# Patient Record
Sex: Male | Born: 1950 | Race: White | Hispanic: No | Smoking: Current every day smoker
Health system: Southern US, Community
[De-identification: ages and names within clinical notes are randomized; demographics above are authoritative.]

## PROBLEM LIST (undated history)

## (undated) DIAGNOSIS — C801 Malignant (primary) neoplasm, unspecified: Secondary | ICD-10-CM

## (undated) DIAGNOSIS — C4491 Basal cell carcinoma of skin, unspecified: Secondary | ICD-10-CM

## (undated) DIAGNOSIS — I2510 Atherosclerotic heart disease of native coronary artery without angina pectoris: Secondary | ICD-10-CM

## (undated) DIAGNOSIS — E2740 Unspecified adrenocortical insufficiency: Secondary | ICD-10-CM

## (undated) DIAGNOSIS — I10 Essential (primary) hypertension: Secondary | ICD-10-CM

## (undated) DIAGNOSIS — H269 Unspecified cataract: Secondary | ICD-10-CM

## (undated) DIAGNOSIS — K219 Gastro-esophageal reflux disease without esophagitis: Secondary | ICD-10-CM

## (undated) DIAGNOSIS — G629 Polyneuropathy, unspecified: Secondary | ICD-10-CM

## (undated) DIAGNOSIS — E785 Hyperlipidemia, unspecified: Secondary | ICD-10-CM

## (undated) DIAGNOSIS — I252 Old myocardial infarction: Secondary | ICD-10-CM

## (undated) DIAGNOSIS — M1990 Unspecified osteoarthritis, unspecified site: Secondary | ICD-10-CM

## (undated) HISTORY — DX: Hyperlipidemia, unspecified: E78.5

## (undated) HISTORY — DX: Polyneuropathy, unspecified: G62.9

## (undated) HISTORY — DX: Malignant (primary) neoplasm, unspecified: C80.1

## (undated) HISTORY — DX: Gastro-esophageal reflux disease without esophagitis: K21.9

## (undated) HISTORY — DX: Old myocardial infarction: I25.2

## (undated) HISTORY — DX: Unspecified cataract: H26.9

## (undated) HISTORY — DX: Atherosclerotic heart disease of native coronary artery without angina pectoris: I25.10

## (undated) HISTORY — DX: Basal cell carcinoma of skin, unspecified: C44.91

## (undated) HISTORY — PX: COLONOSCOPY: SHX174

## (undated) HISTORY — DX: Essential (primary) hypertension: I10

---

## 1997-03-10 HISTORY — PX: LARYNGOSCOPY: SUR817

## 2001-03-17 ENCOUNTER — Emergency Department (HOSPITAL_COMMUNITY): Admission: EM | Admit: 2001-03-17 | Discharge: 2001-03-17 | Payer: Self-pay | Admitting: Emergency Medicine

## 2001-03-17 ENCOUNTER — Encounter: Payer: Self-pay | Admitting: Emergency Medicine

## 2001-03-30 ENCOUNTER — Inpatient Hospital Stay (HOSPITAL_COMMUNITY): Admission: AD | Admit: 2001-03-30 | Discharge: 2001-03-31 | Payer: Self-pay | Admitting: Orthopedic Surgery

## 2001-03-30 DIAGNOSIS — I82409 Acute embolism and thrombosis of unspecified deep veins of unspecified lower extremity: Secondary | ICD-10-CM

## 2001-03-30 HISTORY — DX: Acute embolism and thrombosis of unspecified deep veins of unspecified lower extremity: I82.409

## 2002-04-07 ENCOUNTER — Ambulatory Visit (HOSPITAL_COMMUNITY): Admission: RE | Admit: 2002-04-07 | Discharge: 2002-04-07 | Payer: Self-pay | Admitting: Internal Medicine

## 2004-06-20 ENCOUNTER — Ambulatory Visit: Payer: Self-pay | Admitting: Internal Medicine

## 2004-06-27 ENCOUNTER — Ambulatory Visit: Payer: Self-pay | Admitting: Internal Medicine

## 2004-07-04 ENCOUNTER — Ambulatory Visit: Payer: Self-pay | Admitting: Internal Medicine

## 2004-07-04 ENCOUNTER — Inpatient Hospital Stay (HOSPITAL_COMMUNITY): Admission: EM | Admit: 2004-07-04 | Discharge: 2004-07-09 | Payer: Self-pay | Admitting: Emergency Medicine

## 2004-07-16 ENCOUNTER — Ambulatory Visit: Payer: Self-pay | Admitting: Internal Medicine

## 2004-07-18 ENCOUNTER — Encounter: Admission: RE | Admit: 2004-07-18 | Discharge: 2004-10-16 | Payer: Self-pay | Admitting: Internal Medicine

## 2004-07-30 ENCOUNTER — Ambulatory Visit: Payer: Self-pay | Admitting: Internal Medicine

## 2004-09-02 ENCOUNTER — Ambulatory Visit: Payer: Self-pay | Admitting: Internal Medicine

## 2004-11-07 ENCOUNTER — Ambulatory Visit: Payer: Self-pay | Admitting: Internal Medicine

## 2004-11-14 ENCOUNTER — Ambulatory Visit: Payer: Self-pay | Admitting: Internal Medicine

## 2007-10-09 DIAGNOSIS — I2510 Atherosclerotic heart disease of native coronary artery without angina pectoris: Secondary | ICD-10-CM

## 2007-10-09 HISTORY — DX: Atherosclerotic heart disease of native coronary artery without angina pectoris: I25.10

## 2007-12-11 ENCOUNTER — Ambulatory Visit: Payer: Self-pay | Admitting: Internal Medicine

## 2007-12-11 ENCOUNTER — Ambulatory Visit: Payer: Self-pay | Admitting: *Deleted

## 2007-12-11 ENCOUNTER — Inpatient Hospital Stay (HOSPITAL_COMMUNITY): Admission: EM | Admit: 2007-12-11 | Discharge: 2007-12-14 | Payer: Self-pay | Admitting: Emergency Medicine

## 2007-12-11 DIAGNOSIS — I252 Old myocardial infarction: Secondary | ICD-10-CM

## 2007-12-11 HISTORY — DX: Old myocardial infarction: I25.2

## 2007-12-11 IMAGING — CR DG CHEST 2V
2 series · 2 of 2 positions shown · non-contrast
Comparison: None.

CLINICAL DATA: Chest pain.  Smoker.  History of diabetes.

CHEST - 2 VIEW [DATE]:

[w chest pa *]
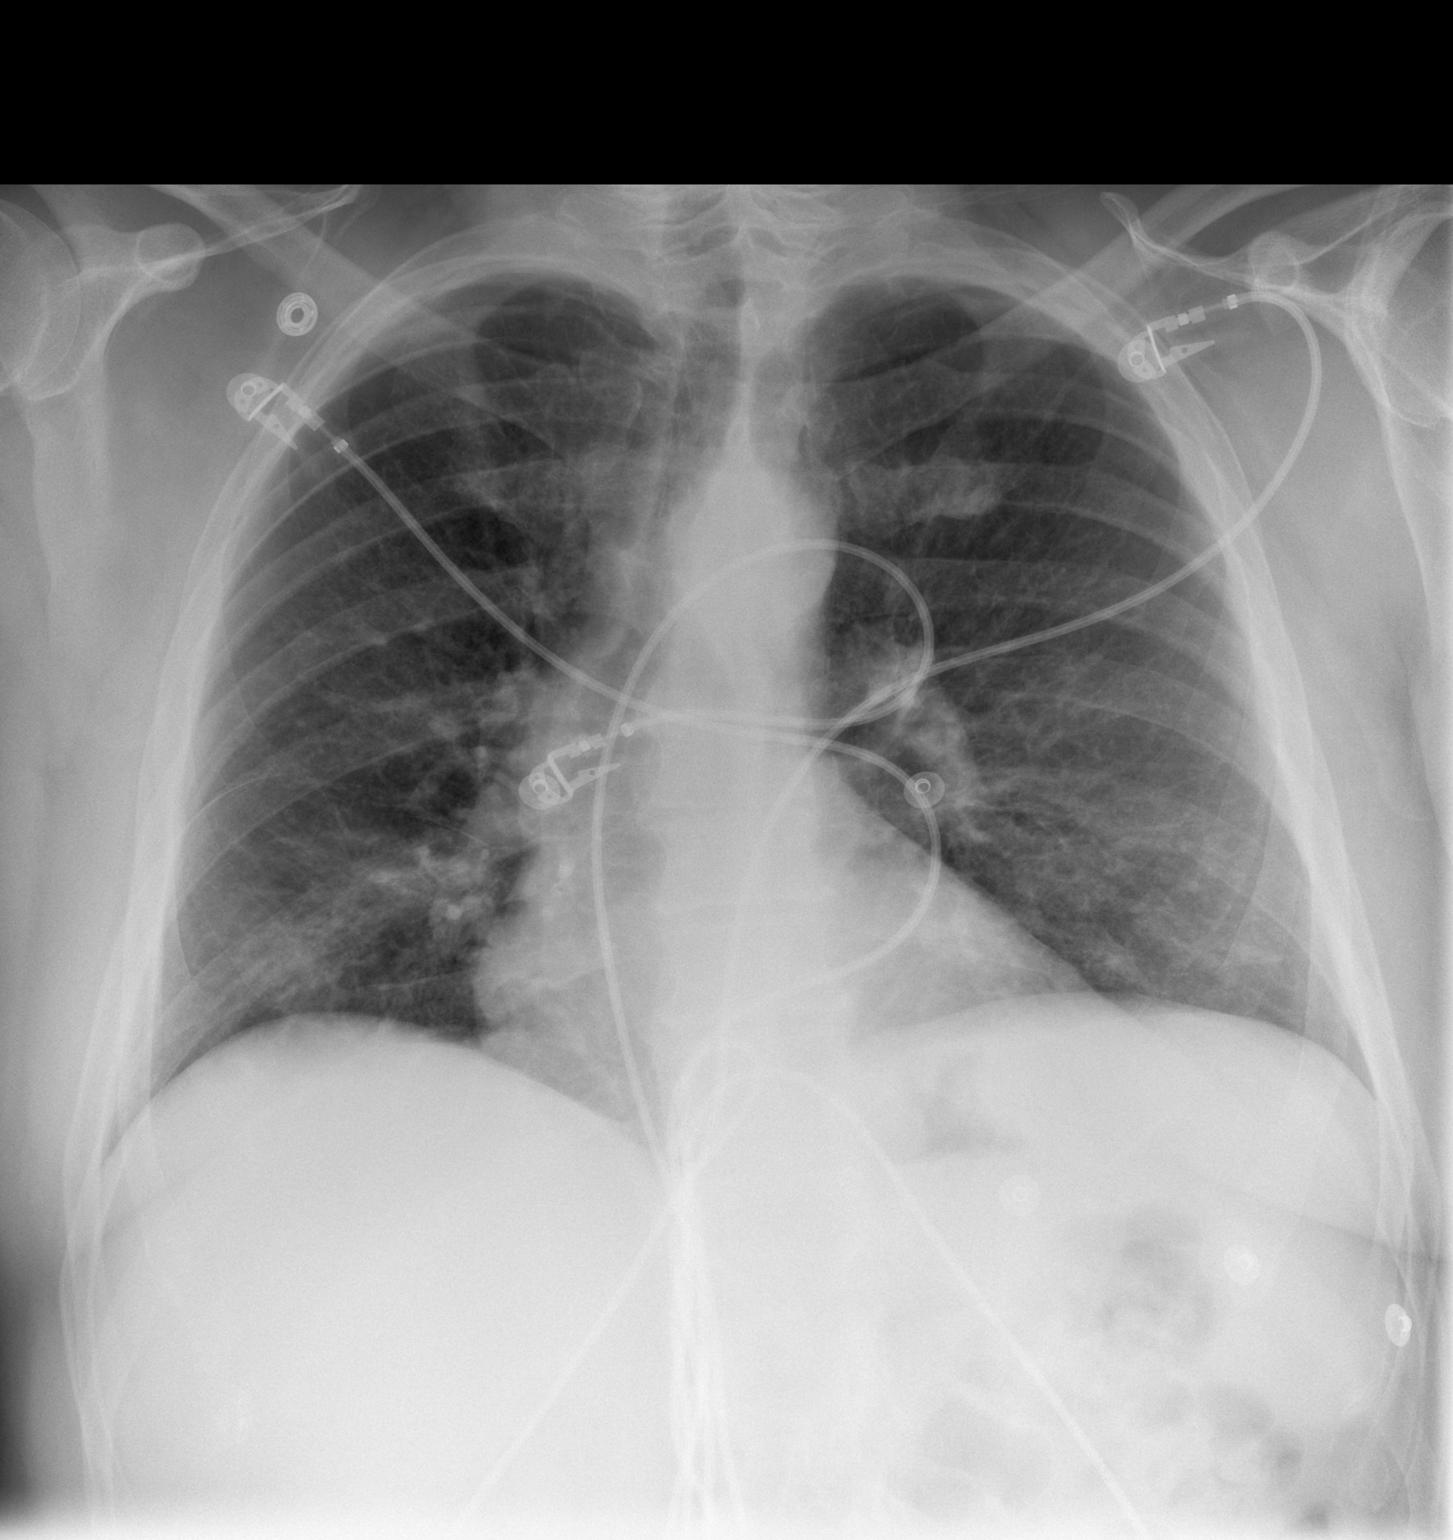

[w chest lat *]
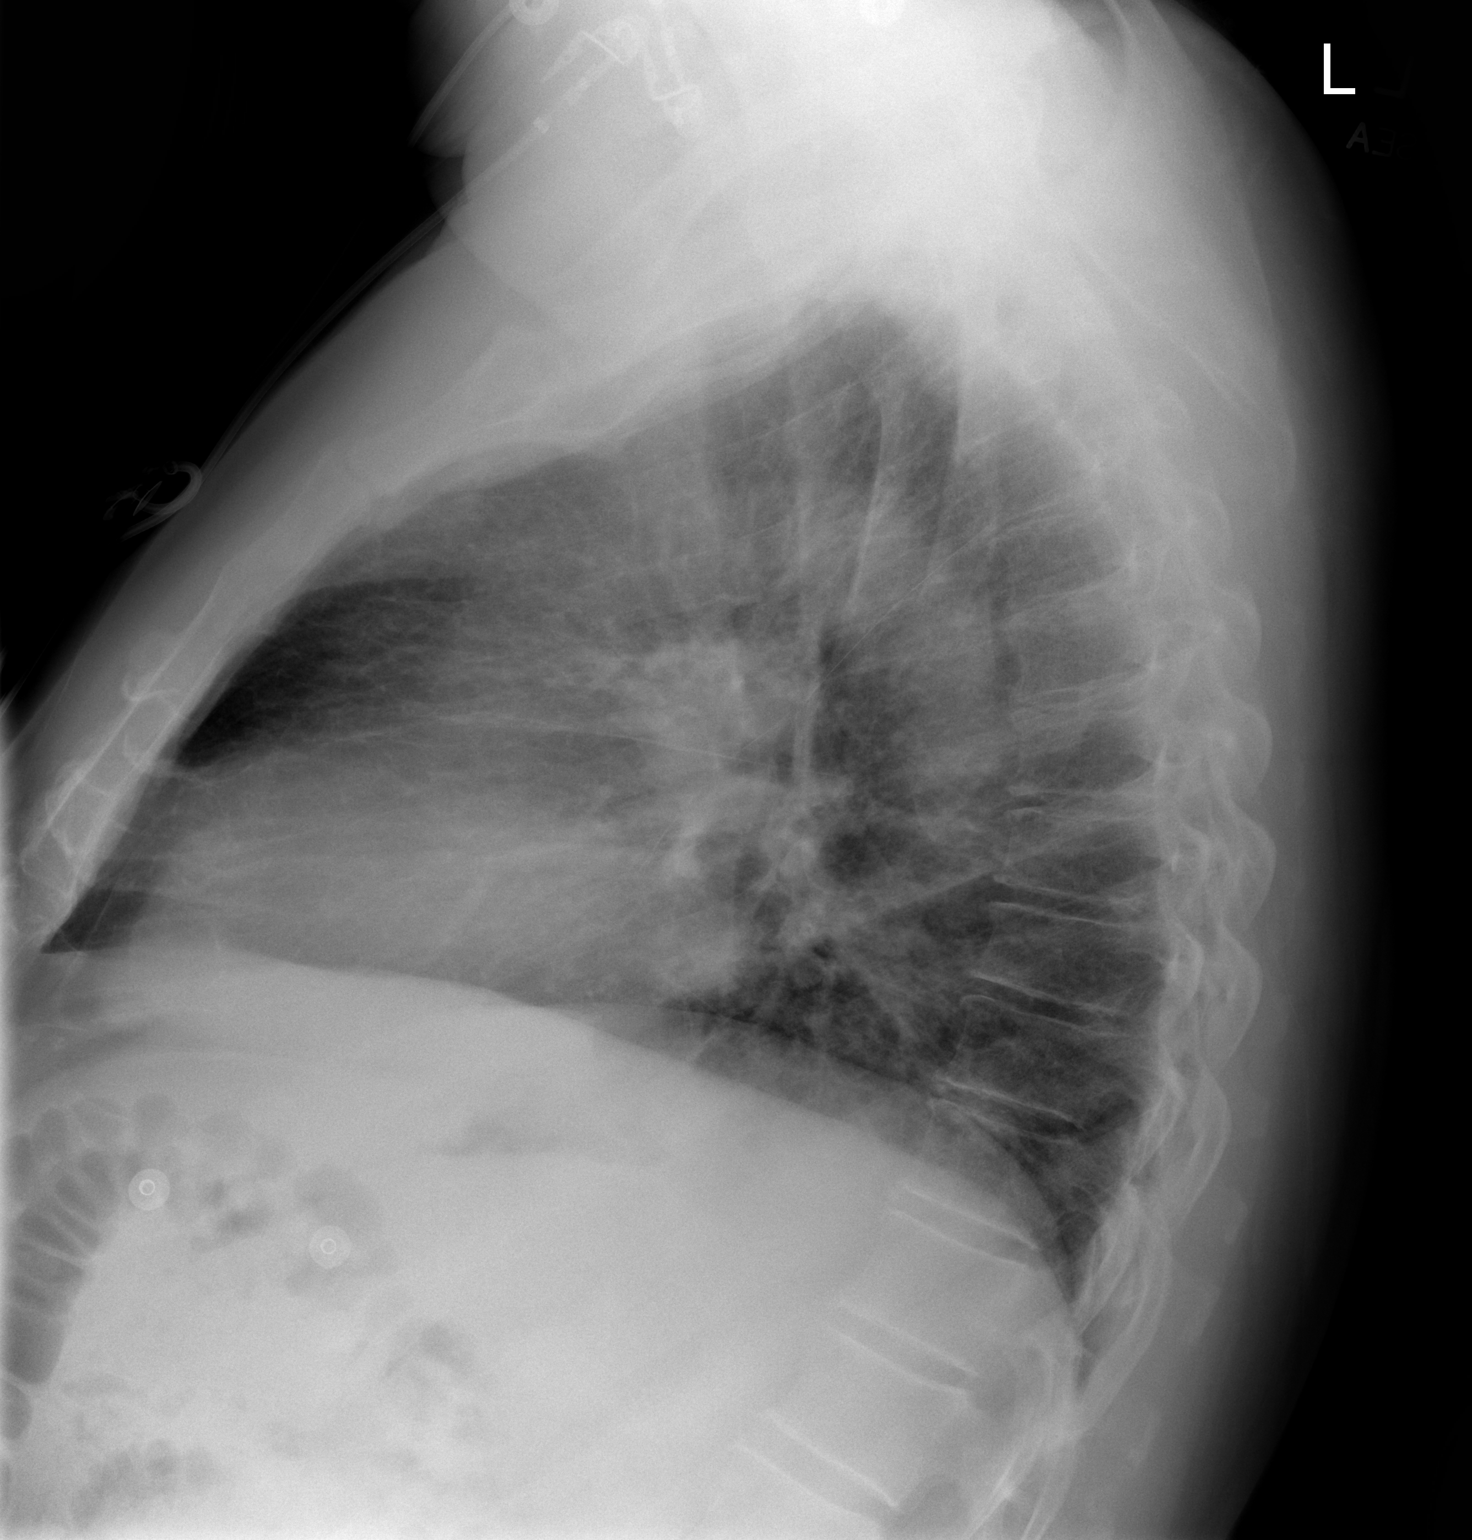

[2 of 2 positions shown; findings below may reference images not displayed]

FINDINGS: Suboptimal inspiration due to body habitus accounting for
crowded bronchovascular markings at the bases.  Focal airspace
opacity in the right lower lobe.  Lungs otherwise clear.  No
pleural effusions.  Heart size upper normal to slightly enlarged.
Thoracic aorta mildly tortuous.  Hilar and mediastinal contours
otherwise unremarkable.  Mild degenerative changes throughout the
thoracic spine.
IMPRESSION: Right lower lobe bronchopneumonia.  Borderline heart size.

## 2007-12-14 ENCOUNTER — Encounter: Payer: Self-pay | Admitting: Internal Medicine

## 2007-12-23 ENCOUNTER — Ambulatory Visit: Payer: Self-pay | Admitting: Internal Medicine

## 2007-12-23 DIAGNOSIS — E785 Hyperlipidemia, unspecified: Secondary | ICD-10-CM

## 2007-12-23 LAB — CONVERTED CEMR LAB: Glucose, Bld: 171 mg/dL

## 2007-12-29 ENCOUNTER — Ambulatory Visit: Payer: Self-pay | Admitting: Cardiology

## 2007-12-30 ENCOUNTER — Telehealth (INDEPENDENT_AMBULATORY_CARE_PROVIDER_SITE_OTHER): Payer: Self-pay | Admitting: *Deleted

## 2008-03-13 ENCOUNTER — Ambulatory Visit: Payer: Self-pay | Admitting: Internal Medicine

## 2008-03-19 LAB — CONVERTED CEMR LAB
ALT: 23 units/L (ref 0–53)
AST: 20 units/L (ref 0–37)
Albumin: 3.9 g/dL (ref 3.5–5.2)
Alkaline Phosphatase: 50 units/L (ref 39–117)
BUN: 15 mg/dL (ref 6–23)
Bilirubin, Direct: 0.1 mg/dL (ref 0.0–0.3)
Cholesterol: 127 mg/dL (ref 0–200)
Creatinine, Ser: 1.1 mg/dL (ref 0.4–1.5)
Direct LDL: 62.6 mg/dL
HDL: 30.7 mg/dL — ABNORMAL LOW (ref >39.0)
Hgb A1c MFr Bld: 6.4 % — ABNORMAL HIGH (ref 4.6–6.0)
Potassium: 4.4 meq/L (ref 3.5–5.1)
Total Bilirubin: 0.6 mg/dL (ref 0.3–1.2)
Total CHOL/HDL Ratio: 4.1
Total Protein: 6.8 g/dL (ref 6.0–8.3)
Triglycerides: 204 mg/dL (ref 0–149)
VLDL: 41 mg/dL — ABNORMAL HIGH (ref 0–40)

## 2008-03-20 ENCOUNTER — Encounter (INDEPENDENT_AMBULATORY_CARE_PROVIDER_SITE_OTHER): Payer: Self-pay | Admitting: *Deleted

## 2008-03-20 ENCOUNTER — Ambulatory Visit: Payer: Self-pay | Admitting: Internal Medicine

## 2008-03-20 DIAGNOSIS — I10 Essential (primary) hypertension: Secondary | ICD-10-CM | POA: Insufficient documentation

## 2008-03-20 DIAGNOSIS — I2510 Atherosclerotic heart disease of native coronary artery without angina pectoris: Secondary | ICD-10-CM | POA: Insufficient documentation

## 2008-03-31 ENCOUNTER — Encounter: Payer: Self-pay | Admitting: Internal Medicine

## 2008-09-25 ENCOUNTER — Ambulatory Visit: Payer: Self-pay | Admitting: Internal Medicine

## 2008-09-25 DIAGNOSIS — J410 Simple chronic bronchitis: Secondary | ICD-10-CM | POA: Insufficient documentation

## 2008-09-25 DIAGNOSIS — Z85828 Personal history of other malignant neoplasm of skin: Secondary | ICD-10-CM | POA: Insufficient documentation

## 2008-09-25 DIAGNOSIS — F17200 Nicotine dependence, unspecified, uncomplicated: Secondary | ICD-10-CM

## 2008-09-25 DIAGNOSIS — Z720 Tobacco use: Secondary | ICD-10-CM | POA: Insufficient documentation

## 2008-09-25 DIAGNOSIS — R61 Generalized hyperhidrosis: Secondary | ICD-10-CM | POA: Insufficient documentation

## 2008-09-25 IMAGING — CR DG CHEST 2V
2 series · 2 of 2 positions shown · non-contrast
Comparison: Chest [DATE].

CLINICAL DATA: Night sweats.

CHEST - 2 VIEW

[view not recorded (1 of 2)]
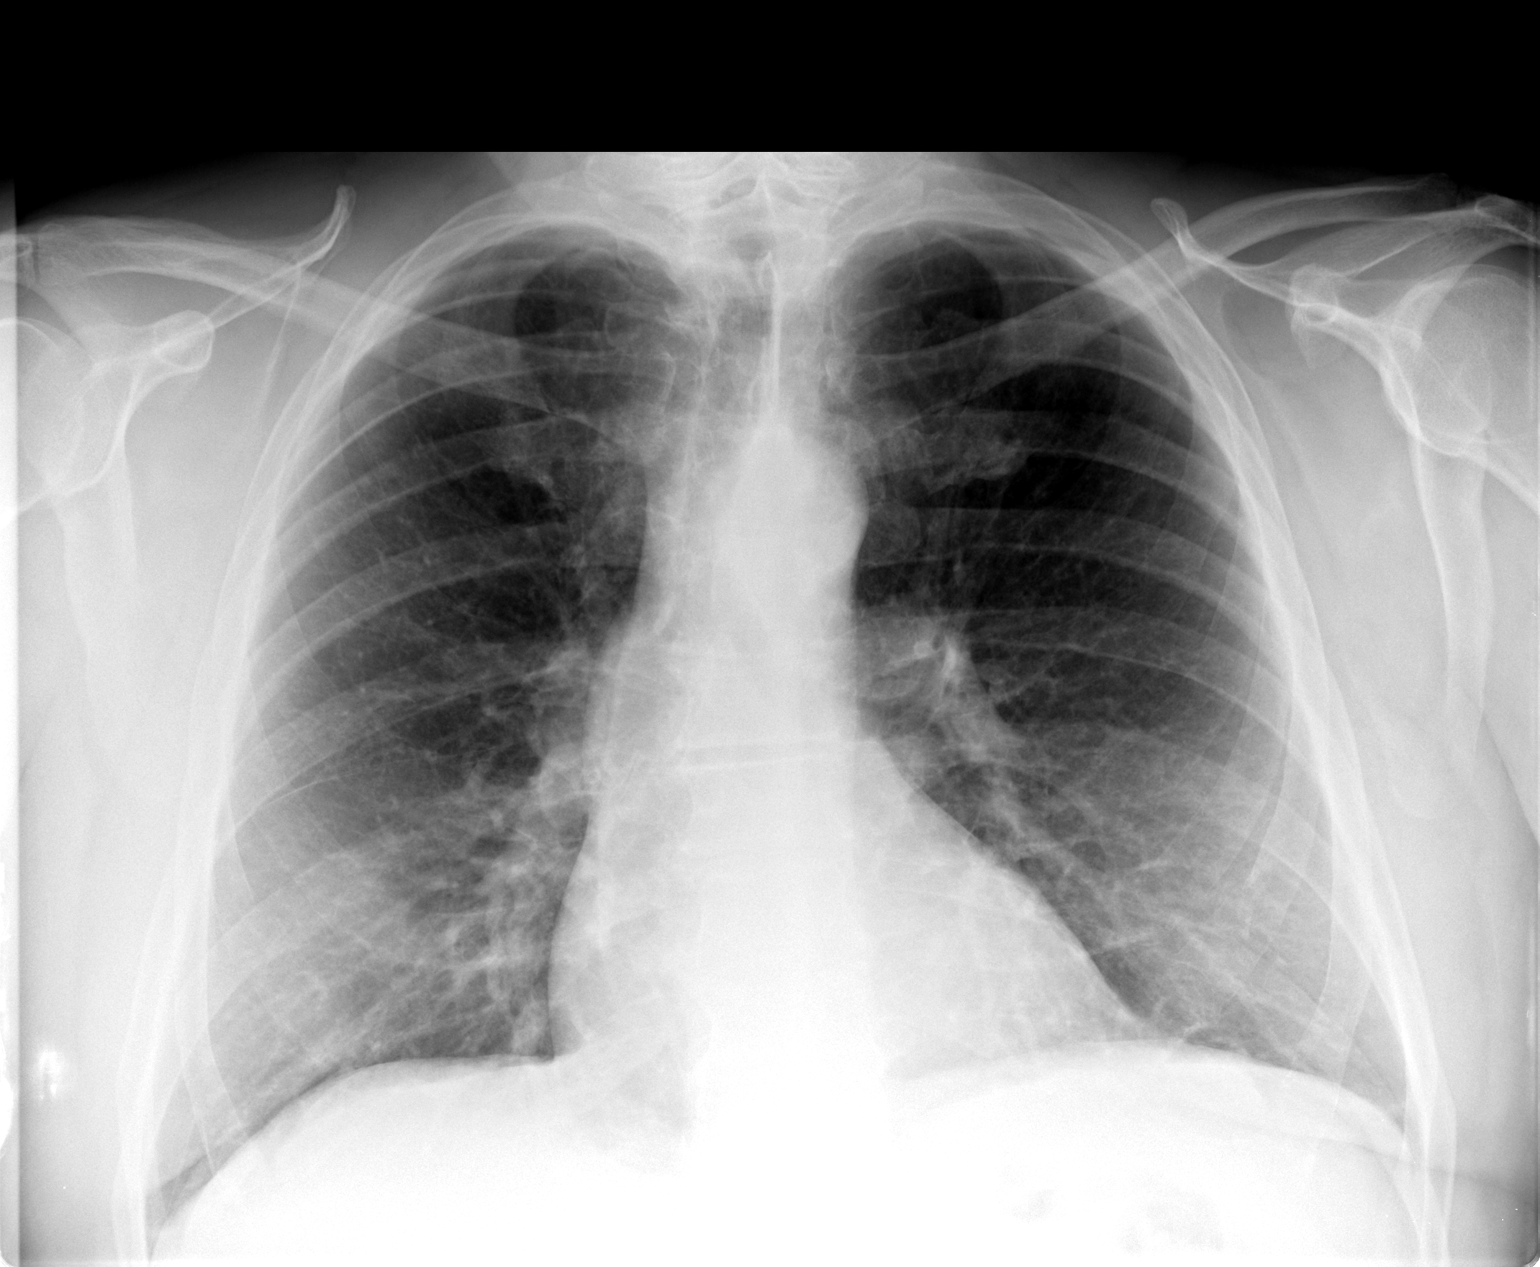

[view not recorded (2 of 2)]
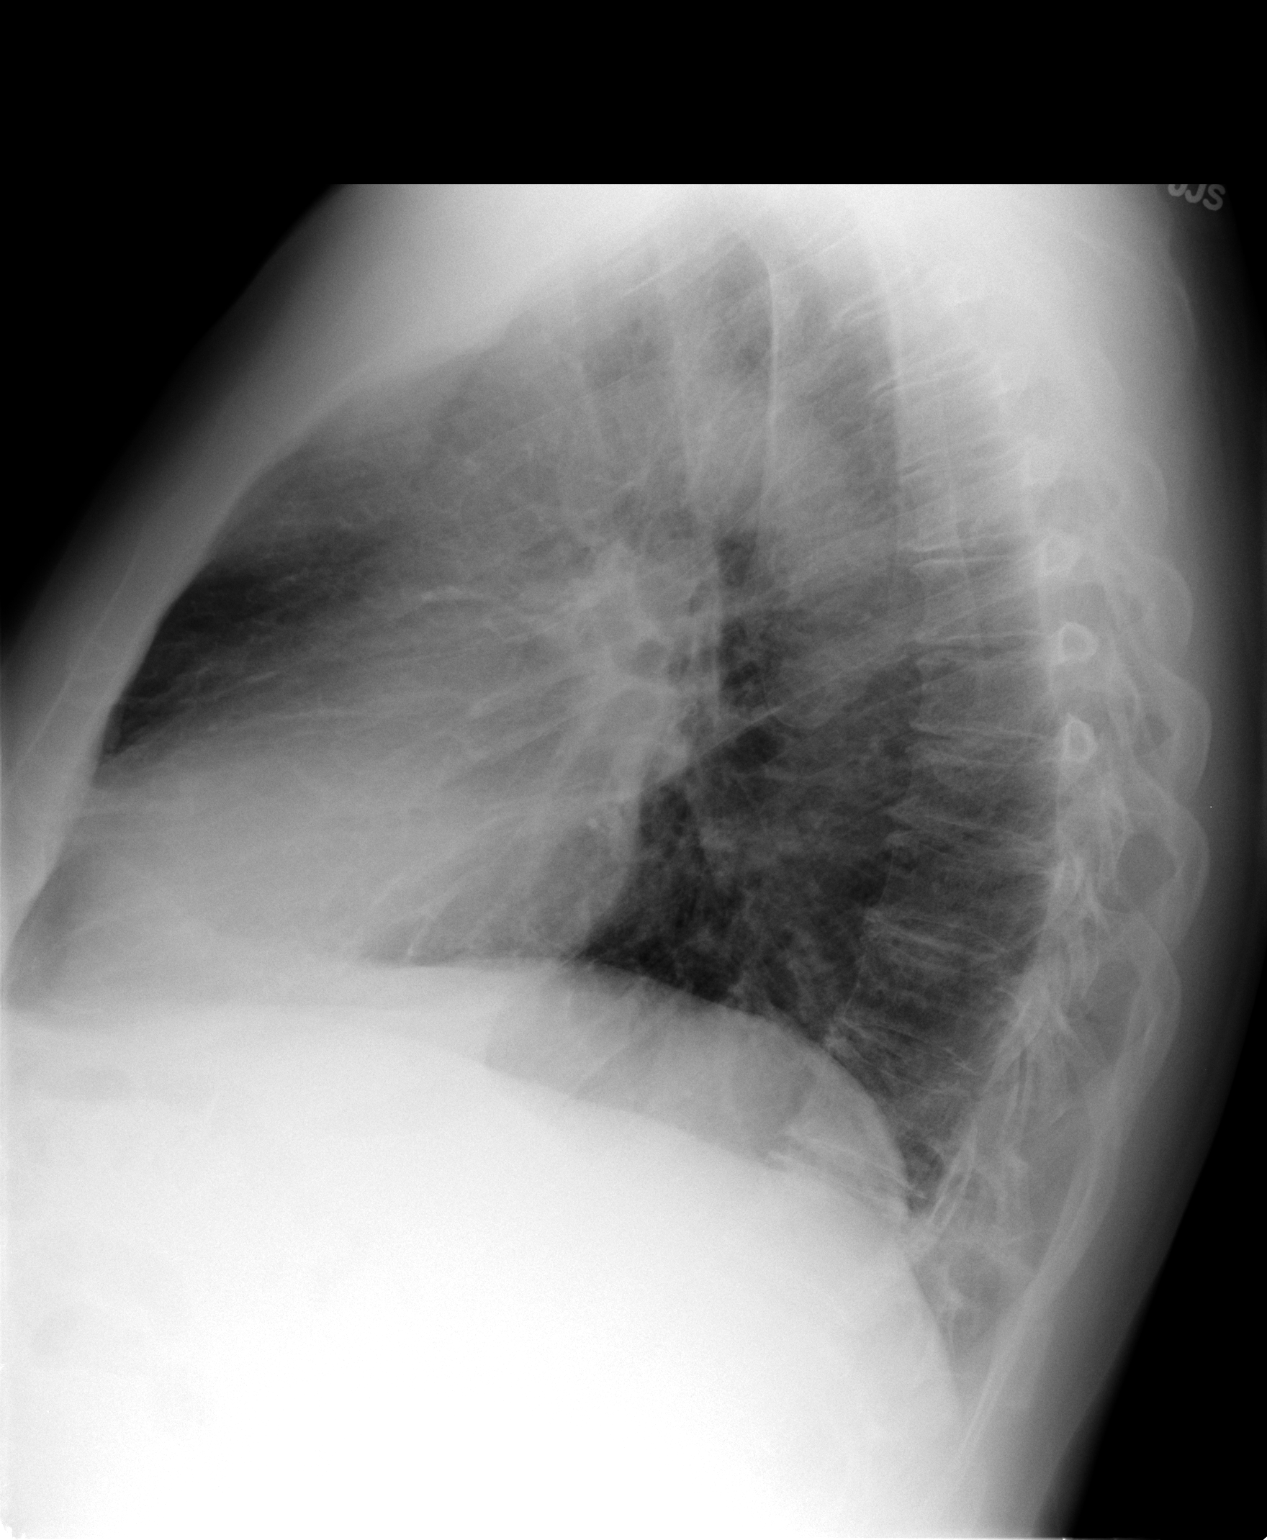

[2 of 2 positions shown; findings below may reference images not displayed]

FINDINGS: The lungs are clear.  Heart size is normal.  There is no
pleural effusion or focal bony abnormality.
IMPRESSION: No acute disease.

## 2008-09-26 ENCOUNTER — Encounter (INDEPENDENT_AMBULATORY_CARE_PROVIDER_SITE_OTHER): Payer: Self-pay | Admitting: *Deleted

## 2008-10-01 LAB — CONVERTED CEMR LAB
ALT: 19 units/L (ref 0–53)
AST: 18 units/L (ref 0–37)
Albumin: 4 g/dL (ref 3.5–5.2)
Alkaline Phosphatase: 51 units/L (ref 39–117)
BUN: 16 mg/dL (ref 6–23)
Basophils Absolute: 0.1 10*3/uL (ref 0.0–0.1)
Basophils Relative: 0.7 % (ref 0.0–3.0)
Bilirubin, Direct: 0.1 mg/dL (ref 0.0–0.3)
Cholesterol: 122 mg/dL (ref 0–200)
Creatinine, Ser: 1 mg/dL (ref 0.4–1.5)
Creatinine,U: 222.8 mg/dL
Eosinophils Absolute: 0.2 10*3/uL (ref 0.0–0.7)
Eosinophils Relative: 1.9 % (ref 0.0–5.0)
HCT: 43.4 % (ref 39.0–52.0)
HDL: 32.5 mg/dL — ABNORMAL LOW (ref >39.00)
Hemoglobin: 15.4 g/dL (ref 13.0–17.0)
Hgb A1c MFr Bld: 6.4 % (ref 4.6–6.5)
LDL Cholesterol: 52 mg/dL (ref 0–99)
Lymphocytes Relative: 18.3 % (ref 12.0–46.0)
Lymphs Abs: 1.5 10*3/uL (ref 0.7–4.0)
MCHC: 35.4 g/dL (ref 30.0–36.0)
MCV: 92.3 fL (ref 78.0–100.0)
Microalb Creat Ratio: 2.7 mg/g (ref 0.0–30.0)
Microalb, Ur: 0.6 mg/dL (ref 0.0–1.9)
Monocytes Absolute: 0.6 10*3/uL (ref 0.1–1.0)
Monocytes Relative: 6.8 % (ref 3.0–12.0)
Neutro Abs: 5.8 10*3/uL (ref 1.4–7.7)
Neutrophils Relative %: 72.3 % (ref 43.0–77.0)
PSA: 0.44 ng/mL (ref 0.10–4.00)
Platelets: 199 10*3/uL (ref 150.0–400.0)
Potassium: 4.5 meq/L (ref 3.5–5.1)
RBC: 4.71 M/uL (ref 4.22–5.81)
RDW: 11.7 % (ref 11.5–14.6)
TSH: 0.61 microintl units/mL (ref 0.35–5.50)
Total Bilirubin: 0.7 mg/dL (ref 0.3–1.2)
Total CHOL/HDL Ratio: 4
Total Protein: 7.1 g/dL (ref 6.0–8.3)
Triglycerides: 188 mg/dL — ABNORMAL HIGH (ref 0.0–149.0)
VLDL: 37.6 mg/dL (ref 0.0–40.0)
WBC: 8.2 10*3/uL (ref 4.5–10.5)

## 2008-10-02 ENCOUNTER — Encounter (INDEPENDENT_AMBULATORY_CARE_PROVIDER_SITE_OTHER): Payer: Self-pay | Admitting: *Deleted

## 2008-10-04 ENCOUNTER — Ambulatory Visit: Payer: Self-pay | Admitting: Cardiology

## 2009-03-10 HISTORY — PX: COLONOSCOPY W/ POLYPECTOMY: SHX1380

## 2009-12-07 ENCOUNTER — Ambulatory Visit: Payer: Self-pay | Admitting: Internal Medicine

## 2009-12-10 ENCOUNTER — Ambulatory Visit: Payer: Self-pay | Admitting: Internal Medicine

## 2009-12-10 ENCOUNTER — Encounter: Payer: Self-pay | Admitting: Gastroenterology

## 2009-12-10 DIAGNOSIS — E1165 Type 2 diabetes mellitus with hyperglycemia: Secondary | ICD-10-CM | POA: Insufficient documentation

## 2009-12-10 DIAGNOSIS — E119 Type 2 diabetes mellitus without complications: Secondary | ICD-10-CM | POA: Insufficient documentation

## 2009-12-10 DIAGNOSIS — E1149 Type 2 diabetes mellitus with other diabetic neurological complication: Secondary | ICD-10-CM

## 2009-12-10 DIAGNOSIS — E118 Type 2 diabetes mellitus with unspecified complications: Secondary | ICD-10-CM | POA: Insufficient documentation

## 2009-12-10 LAB — CONVERTED CEMR LAB: Hgb A1c MFr Bld: 7.8 % — ABNORMAL HIGH (ref 4.6–6.5)

## 2009-12-24 ENCOUNTER — Encounter (INDEPENDENT_AMBULATORY_CARE_PROVIDER_SITE_OTHER): Payer: Self-pay | Admitting: *Deleted

## 2009-12-26 ENCOUNTER — Ambulatory Visit: Payer: Self-pay | Admitting: Gastroenterology

## 2010-01-09 ENCOUNTER — Ambulatory Visit: Payer: Self-pay | Admitting: Gastroenterology

## 2010-01-15 ENCOUNTER — Encounter: Payer: Self-pay | Admitting: Gastroenterology

## 2010-03-12 ENCOUNTER — Ambulatory Visit: Admit: 2010-03-12 | Payer: Self-pay | Admitting: Internal Medicine

## 2010-03-15 ENCOUNTER — Ambulatory Visit: Admit: 2010-03-15 | Payer: Self-pay | Admitting: Internal Medicine

## 2010-04-11 NOTE — Miscellaneous (Signed)
Summary: DIRECT COLON SCREEN-AGE?YF/previsit  Clinical Lists Changes  Medications: Added new medication of MOVIPREP 100 GM  SOLR (PEG-KCL-NACL-NASULF-NA ASC-C) As directed - Signed Rx of MOVIPREP 100 GM  SOLR (PEG-KCL-NACL-NASULF-NA ASC-C) As directed;  #1 x 0;  Signed;  Entered by: Clide Cliff RN;  Authorized by: Rachael Fee MD;  Method used: [id number] [address], [place], [place], [place]  [address], Ph: [phone], Fax: [phone] Allergies: Changed allergy or adverse reaction from PCN to PCN    Prescriptions: MOVIPREP 100 GM  SOLR (PEG-KCL-NACL-NASULF-NA ASC-C) As directed  #1 x 0   Entered by:   Clide Cliff RN   Authorized by:   Rachael Fee MD   Signed by:   Clide Cliff RN on 12/26/2009   Method used:   [facility]        [id number]       [address]       [place], [place]  [address]       Ph: [phone]       Fax: [phone]   RxID:   [id number]

## 2010-04-11 NOTE — Letter (Signed)
Summary: Diabetic Instructions  [facility]  [address] [place], [place] [address]   Phone: [phone]  Fax: [phone]    LADD CEN 1950/05/18 MRN: [id number]   _  _   ORAL DIABETIC MEDICATION INSTRUCTIONS  The day before your procedure:   Take your diabetic pill as you do normally  The day of your procedure:   Do not take your diabetic pill    We will check your blood sugar levels during the admission process and again in Recovery before discharging you home  ________________________________________________________________________  _  _   INSULIN (LONG ACTING) MEDICATION INSTRUCTIONS (Lantus, NPH, 70/30, Humulin, Novolin-N)   The day before your procedure:   Take  your regular evening dose    The day of your procedure:   Do not take your morning dose    _  _   INSULIN (SHORT ACTING) MEDICATION INSTRUCTIONS (Regular, Humulog, Novolog)   The day before your procedure:   Do not take your evening dose   The day of your procedure:   Do not take your morning dose   _  _   INSULIN PUMP MEDICATION INSTRUCTIONS  We will contact the physician managing your diabetic care for written dosage instructions for the day before your procedure and the day of your procedure.  Once we have received the instructions, we will contact you.

## 2010-04-11 NOTE — Letter (Signed)
Summary: Results Letter  [facility]  [address] [place], [place] [address]   Phone: [phone]  Fax: [phone]        January 15, 2010 MRN: [id number]    Brandon Rubio [address] [place], [place]  [address]    Dear Brandon Rubio,   Two of the five polyps removed during your recent procedure were proven to be adenomatous.  These are pre-cancerous polyps that may have grown into cancers if they had not been removed.  Based on current nationally recognized surveillance guidelines, I recommend that you have a repeat colonoscopy in 5 years.  We will therefore put your information in our reminder system and will contact you in 5 years to schedule a repeat procedure.  Please call if you have any questions or concerns.      Sincerely,  Rachael Fee MD  This letter has been electronically signed by your physician.  Appended Document: Results Letter letter mailed

## 2010-04-11 NOTE — Letter (Signed)
Summary: [facility] Instructions  [facility]  [address] [place], [place] [address]   Phone: [phone]  Fax: [phone]       MOHAMUD MROZEK    [birth year]    MRN: [id number]        Procedure Day Dorna Bloom:  Wednesday 01/09/2010     Arrival Time: 8:00 am      Procedure Time: 9:00 am     Location of Procedure:                    _ x_   [facility]oscopy Center (4th Floor)                        PREPARATION FOR COLONOSCOPY WITH MOVIPREP   Starting 5 days prior to your procedure Friday 10/28 do not eat nuts, seeds, popcorn, corn, beans, peas,  salads, or any raw vegetables.  Do not take any fiber supplements (e.g. Metamucil, Citrucel, and Benefiber).  THE DAY BEFORE YOUR PROCEDURE         DATE: Tuesday 11/1  1.  Drink clear liquids the entire day-NO SOLID FOOD  2.  Do not drink anything colored red or purple.  Avoid juices with pulp.  No orange juice.  3.  Drink at least 64 oz. (8 glasses) of fluid/clear liquids during the day to prevent dehydration and help the prep work efficiently.  CLEAR LIQUIDS INCLUDE: Water Jello Ice Popsicles Tea (sugar ok, no milk/cream) Powdered fruit flavored drinks Coffee (sugar ok, no milk/cream) Gatorade Juice: apple, white grape, white cranberry  Lemonade Clear bullion, consomm, broth Carbonated beverages (any kind) Strained chicken noodle soup Hard Candy                             4.  In the morning, mix first dose of MoviPrep solution:    Empty 1 Pouch A and 1 Pouch B into the disposable container    Add lukewarm drinking water to the top line of the container. Mix to dissolve    Refrigerate (mixed solution should be used within 24 hrs)  5.  Begin drinking the prep at 5:00 p.m. The MoviPrep container is divided by 4 marks.   Every 15 minutes drink the solution down to the next mark (approximately 8 oz) until the full liter is complete.   6.  Follow completed prep with 16 oz of clear liquid of your choice  (Nothing red or purple).  Continue to drink clear liquids until bedtime.  7.  Before going to bed, mix second dose of MoviPrep solution:    Empty 1 Pouch A and 1 Pouch B into the disposable container    Add lukewarm drinking water to the top line of the container. Mix to dissolve    Refrigerate  THE DAY OF YOUR PROCEDURE      DATE: Wednesday 11/2  Beginning at 4:00 a.m. (5 hours before procedure):         1. Every 15 minutes, drink the solution down to the next mark (approx 8 oz) until the full liter is complete.  2. Follow completed prep with 16 oz. of clear liquid of your choice.    3. You may drink clear liquids until 7:00 am (2 HOURS BEFORE PROCEDURE).   MEDICATION INSTRUCTIONS  Unless otherwise instructed, you should take regular prescription medications with a small sip of water   as early as possible the morning of  your procedure.  Diabetic patients - see separate instructions.         OTHER INSTRUCTIONS  You will need a responsible adult at least 62 years of age to accompany you and drive you home.   This person must remain in the waiting room during your procedure.  Wear loose fitting clothing that is easily removed.  Leave jewelry and other valuables at home.  However, you may wish to bring a book to read or  an iPod/MP3 player to listen to music as you wait for your procedure to start.  Remove all body piercing jewelry and leave at home.  Total time from sign-in until discharge is approximately 2-3 hours.  You should go home directly after your procedure and rest.  You can resume normal activities the  day after your procedure.  The day of your procedure you should not:   Drive   Make legal decisions   Operate machinery   Drink alcohol   Return to work  You will receive specific instructions about eating, activities and medications before you leave.    The above instructions have been reviewed and explained to me by   Clide Cliff,  RN_____________________    I fully understand and can verbalize these instructions _____________________________ Date _________

## 2010-04-11 NOTE — Procedures (Signed)
Summary: Colonoscopy  Patient: [name] Note: All result statuses are Final unless otherwise noted.  Tests: (1) Colonoscopy (COL)   COL Colonoscopy           DONE     [facility]     [address]     [place], [place]  [address]           COLONOSCOPY PROCEDURE REPORT           PATIENT:  Brandon Rubio, Brandon Rubio  MR#:  [id number]     BIRTHDATE:  [birth year], 59 yrs. old  GENDER:  male     ENDOSCOPIST:  Rachael Fee, MD     REF. BY:  Marga Melnick, M.D.     PROCEDURE DATE:  01/09/2010     PROCEDURE:  Colonoscopy with snare polypectomy     ASA CLASS:  Class II     INDICATIONS:  Elevated Risk Screening, father had colon cancer in     his 42's     MEDICATIONS:  Fentanyl 100 mcg IV, Versed 10 mg IV           DESCRIPTION OF PROCEDURE:   After the risks benefits and     alternatives of the procedure were thoroughly explained, informed     consent was obtained.  Digital rectal exam was performed and     revealed no rectal masses.   The LB [id number]-[id number] endoscope     was introduced through the anus and advanced to the cecum, which     was identified by both the appendix and ileocecal valve, without     limitations.  The quality of the prep was good, using MoviPrep.     The instrument was then slowly withdrawn as the colon was fully     examined.     <<PROCEDUREIMAGES>>     FINDINGS:  Five small, sessile polyps were found. All were removed     with cold snare. One was 3mm, located in ascending colon, sent to     pathology (jar 1). One was 6mm, located in transverse colon, sent     to pathology (jar 1). Three were hyperplastic appearing, located     in rectosigmoid, 2-24mm across, sent to pathology (jar 2) (see     image6 and image7).  This was otherwise a normal examination of     the colon (see image8, image2, and image3).   Retroflexed views in     the rectum revealed no abnormalities.    The scope was then     withdrawn from the patient and the procedure  completed.     COMPLICATIONS:  None           ENDOSCOPIC IMPRESSION:     1) Several small polyps, all removed and all sent to pathology.     Many appeared hyperplastic     2) Otherwise normal examination           RECOMMENDATIONS:     1) If the polyps removed today are proven to be adenomatous     (pre-cancerous) polyps, you will need a colonoscopy in 3-5 years.     Otherwise you should continue to follow colorectal cancer     screening guidelines for "routine risk" patients with a     colonoscopy in 10 years.     2) You will receive a letter within 1-2 weeks with the results     of your biopsy as well as final recommendations. Please call my  office if you have not received a letter after 3 weeks.           ______________________________     Rachael Fee, MD           n.     eSIGNED:   Rachael Fee at 01/09/2010 09:13 AM           Margart Sickles, [id number]  Note: An exclamation mark (!) indicates a result that was not dispersed into the flowsheet. Document Creation Date: 01/09/2010 9:14 AM _______________________________________________________________________  (1) Order result status: Final Collection or observation date-time: 01/09/2010 09:08 Requested date-time:  Receipt date-time:  Reported date-time:  Referring Physician:   Ordering Physician: Rob Bunting [phone]) Specimen Source:  Source: Launa Grill Order Number: [id number] Lab site:   Appended Document: Colonoscopy     Procedures Next Due Date:    Colonoscopy: 01/2015

## 2010-04-11 NOTE — Assessment & Plan Note (Signed)
Summary: FOLLOW UP MEDS,LAB PRIOR/RH.......Marland Kitchen   Vital Signs:  Patient profile:   60 year old male Weight:      255.2 pounds BMI:     34.74 Temp:     98.2 degrees F oral Pulse rate:   76 / minute Resp:     16 per minute BP sitting:   134 / 78  (left arm) Cuff size:   large  Vitals Entered By: Shonna Chock CMA (December 10, 2009 8:07 AM) CC: Follow-up visit: discuss labs (copy given), Type 2 diabetes mellitus follow-up   Primary Care Provider:  DR HOPPER  CC:  Follow-up visit: discuss labs (copy given) and Type 2 diabetes mellitus follow-up.  History of Present Illness: Type 2 Diabetes Mellitus Follow-Up      This is a Brandon Rubio who presents for Type 2 diabetes mellitus follow-up.  The patient reports blurred vision, weight gain of 30#, and numbness of extremities, but denies polyuria, polydipsia. He has had no  hypoglycemia.  Other symptoms include intermittent claudication ( he has not seen Dr Juanda Chance  since 09/2008).  The patient denies the following symptoms: neuropathic pain, chest pain, vomiting, orthostatic symptoms, poor wound healing, vision loss, and foot ulcer.  Since the last visit the patient reports poor dietary compliance and not exercising regularly.  The patient has been measuring capillary blood glucose very irregularly  before breakfast ( 70-120) and  2 hrs after lunch ( < 180).  Since the last visit, the patient reports having had no eye care and no foot care.  He describes work & financial stress; he had been working in [place] until 6 months ago. He is smoking 1 ppd. A1c 7.8% ( average sugar 176 & long term risk 56%). Plavix was stopped by Dr Juanda Chance 09/2008 as prelude to colonoscopy; it was never completed. SOC reviewed  as he has never had colonoscopy.  Allergies: 1)  ! Pcn  Review of Systems Resp:  Denies cough, coughing up blood, shortness of breath, sputum productive, and wheezing. GI:  Denies abdominal pain, bloody stools, and dark tarry stools. Psych:   Denies anxiety, depression, easily angered, easily tearful, and irritability; Previously depressed about work status.  Physical Exam  General:  well-nourished, alert,appropriate and cooperative throughout examination Lungs:  Normal respiratory effort, chest expands symmetrically. Lungs are clear to auscultation, no crackles or wheezes but BS decreased Heart:  Normal rate and regular rhythm. S1 and S2 normal without gallop, murmur, click, rub .S4 Abdomen:  Bowel sounds positive,abdomen soft and non-tender without masses, organomegaly or hernias noted. Pulses:  R and L carotid,radial,dorsalis pedis and posterior tibial pulses are full and equal bilaterally Extremities:  No clubbing, cyanosis, edema. Good nail health. Neurologic:  alert & oriented X3, sensation intact to light touch over feet, and DTRs symmetrical and normal.   Skin:  Intact without suspicious lesions or rashes Psych:  memory intact for recent and remote, normally interactive, not anxious appearing, and not depressed appearing.     Impression & Recommendations:  Problem # 1:  DIABETES MELLITUS, UNCONTROLLED (ICD-250.02)  His updated medication list for this problem includes:    Glucophage 500 Mg Tabs (Metformin hcl) .Marland Kitchen... 1 two times a day with largest meals    Ecotrin Low Strength 81 Mg Tbec (Aspirin) .Marland Kitchen... 1 by mouth once daily  Problem # 2:  CIGARETTE SMOKER (ICD-305.1) Risk discussed  Problem # 3:  UNSPECIFIED ESSENTIAL HYPERTENSION (ICD-401.9) controlled His updated medication list for this problem includes:  Metoprolol Succinate 50 Mg Xr24h-tab (Metoprolol succinate) .Marland Kitchen... 1 by mouth once daily  Problem # 4:  SCREENING, COLON CANCER (ICD-V76.Brandon)  Orders: Gastroenterology Referral (GI)  Complete Medication List: 1)  Simvastatin 40 Mg Tabs (Simvastatin) .Marland Kitchen.. 1 by mouth once daily 2)  Glucophage 500 Mg Tabs (Metformin hcl) .Marland Kitchen.. 1 two times a day with largest meals 3)  Metoprolol Succinate 50 Mg Xr24h-tab  (Metoprolol succinate) .Marland Kitchen.. 1 by mouth once daily 4)  Ecotrin Low Strength 81 Mg Tbec (Aspirin) .Marland Kitchen.. 1 by mouth once daily 5)  Freestyle Lite Test Strp (Glucose blood) .... Two times a day 6)  Prilosec Otc 20 Mg Tbec (Omeprazole magnesium) .Marland Kitchen.. 1 by mouth once daily 7)  Daily Multiple Vitamins Tabs (Multiple vitamin) .Marland Kitchen.. 1 once daily 8)  Hydrocodone-acetaminophen 2.5-500 Mg Tabs (Hydrocodone-acetaminophen) .Marland Kitchen.. 1 by mouth once daily  Patient Instructions: 1)  Consume LESS THAN 40 grams of HFCS sugar/ day as discussed. 2)  See your eye doctor yearly to check for diabetic eye damage. 3)  Check your feet each night for sore areas, calluses or signs of infection. 4)  Please schedule a follow-up appointment in 3 months. 5)  BUN,creat, K+ prior to visit, ICD-9:401.9 6)  Hepatic Panel prior to visit, ICD-9:995.20 7)  Lipid Panel prior to visit, ICD-9:272.4 8)  TSH prior to visit, ICD-9:272.4 9)  HbgA1C prior to visit, ICD-9:250.02 10)  Urine Microalbumin prior to visit, ICD-9:250.02 Prescriptions: GLUCOPHAGE 500 MG TABS (METFORMIN HCL) 1 two times a day with largest meals  #180 x 0   Entered and Authorized by:   Marga Melnick MD   Signed by:   Marga Melnick MD on 12/10/2009   Method used:   Print then Give to Patient   RxID:   [id number]

## 2010-04-11 NOTE — Letter (Signed)
Summary: Pre Visit Letter Revised  [facility]  [address] [place], [place] [address]   Phone: [phone]  Fax: [phone]        12/10/2009 MRN: [id number]  Brandon Rubio [address] [place], [place]  [address]             Procedure Date:  11-2 at 9am  Welcome to the Gastroenterology Division at [facility].    You are scheduled to see a nurse for your pre-procedure visit on 12-1909 at 9am on the 3rd floor at [facility], [address]  We ask that you try to arrive at our office 15 minutes prior to your appointment time to allow for check-in.  Please take a minute to review the attached form.  If you answer "Yes" to one or more of the questions on the first page, we ask that you call the person listed at your earliest opportunity.  If you answer "No" to all of the questions, please complete the rest of the form and bring it to your appointment.    Your nurse visit will consist of discussing your medical and surgical history, your immediate family medical history, and your medications.   If you are unable to list all of your medications on the form, please bring the medication bottles to your appointment and we will list them.  We will need to be aware of both prescribed and over the counter drugs.  We will need to know exact dosage information as well.    Please be prepared to read and sign documents such as consent forms, a financial agreement, and acknowledgement forms.  If necessary, and with your consent, a friend or relative is welcome to sit-in on the nurse visit with you.  Please bring your insurance card so that we may make a copy of it.  If your insurance requires a referral to see a specialist, please bring your referral form from your primary care physician.  No co-pay is required for this nurse visit.     If you cannot keep your appointment, please call [phone] to cancel or reschedule prior to your appointment date.  This allows  [place] the opportunity to schedule an appointment for another patient in need of care.    Thank you for choosing [facility] for your medical needs.  We appreciate the opportunity to care for you.  Please visit [place] at our website  to learn more about our practice.  Sincerely, The Gastroenterology Division

## 2010-05-21 LAB — GLUCOSE, CAPILLARY
Glucose-Capillary: 128 mg/dL — ABNORMAL HIGH (ref 70–99)
Glucose-Capillary: 174 mg/dL — ABNORMAL HIGH (ref 70–99)

## 2010-06-11 ENCOUNTER — Other Ambulatory Visit: Payer: Self-pay | Admitting: Internal Medicine

## 2010-06-13 ENCOUNTER — Other Ambulatory Visit (INDEPENDENT_AMBULATORY_CARE_PROVIDER_SITE_OTHER): Payer: BC Managed Care – PPO

## 2010-06-13 DIAGNOSIS — E785 Hyperlipidemia, unspecified: Secondary | ICD-10-CM

## 2010-06-13 DIAGNOSIS — I10 Essential (primary) hypertension: Secondary | ICD-10-CM

## 2010-06-13 DIAGNOSIS — T887XXA Unspecified adverse effect of drug or medicament, initial encounter: Secondary | ICD-10-CM

## 2010-06-13 DIAGNOSIS — IMO0001 Reserved for inherently not codable concepts without codable children: Secondary | ICD-10-CM

## 2010-06-13 LAB — HEPATIC FUNCTION PANEL
Bilirubin, Direct: 0.1 mg/dL (ref 0.0–0.3)
Total Bilirubin: 0.4 mg/dL (ref 0.3–1.2)
Total Protein: 7.3 g/dL (ref 6.0–8.3)

## 2010-06-13 LAB — BUN: BUN: 16 mg/dL (ref 6–23)

## 2010-06-13 LAB — LIPID PANEL
HDL: 32.5 mg/dL — ABNORMAL LOW (ref >39.00)
Total CHOL/HDL Ratio: 6
Triglycerides: 277 mg/dL — ABNORMAL HIGH (ref 0.0–149.0)
VLDL: 55.4 mg/dL — ABNORMAL HIGH (ref 0.0–40.0)

## 2010-06-13 LAB — HEMOGLOBIN A1C: Hgb A1c MFr Bld: 8.7 % — ABNORMAL HIGH (ref 4.6–6.5)

## 2010-06-13 LAB — CREATININE, SERUM: Creatinine, Ser: 0.9 mg/dL (ref 0.4–1.5)

## 2010-06-13 LAB — LDL CHOLESTEROL, DIRECT: Direct LDL: 101.2 mg/dL

## 2010-06-13 LAB — TSH: TSH: 1.11 u[IU]/mL (ref 0.35–5.50)

## 2010-06-13 LAB — POTASSIUM: Potassium: 4.5 mEq/L (ref 3.5–5.1)

## 2010-06-14 ENCOUNTER — Encounter: Payer: Self-pay | Admitting: Internal Medicine

## 2010-06-20 ENCOUNTER — Encounter: Payer: Self-pay | Admitting: Internal Medicine

## 2010-06-20 ENCOUNTER — Ambulatory Visit (INDEPENDENT_AMBULATORY_CARE_PROVIDER_SITE_OTHER): Payer: BC Managed Care – PPO | Admitting: Internal Medicine

## 2010-06-20 DIAGNOSIS — I2510 Atherosclerotic heart disease of native coronary artery without angina pectoris: Secondary | ICD-10-CM

## 2010-06-20 DIAGNOSIS — E785 Hyperlipidemia, unspecified: Secondary | ICD-10-CM

## 2010-06-20 DIAGNOSIS — F17200 Nicotine dependence, unspecified, uncomplicated: Secondary | ICD-10-CM

## 2010-06-20 NOTE — Progress Notes (Signed)
Subjective:    Patient ID: Brandon Rubio, male    DOB: 02-09-1951, 60 y.o.   MRN: [id number]  HPI CHRONIC DIABETES  Disease Monitoring: A1c 8.7%= average sugar 203 & risk 75 % increased  Blood Sugar Ranges:FBS  90-100 "when I check it... Once a week".   Polyuria/dipsia/phagia: no   Visual problems: no ; "occasionally blurred"  Medication Compliance: no, "I miss the night Metformin ... Only taken once a week"  Medication Side Effects  Hypoglycemia: no   Preventitive Health Care  Eye Exam: 12-18 mos ago?  Foot Exam: no  Diet pattern: no diet; traveling  Exercise: yes; job is physical    Dyslipidemia assessment: Lab results  review:     LDL 101.2; HDL 32.5,TG 277                                                                             Prior advanced Lipid testing : NMR Lipoprofile yes / [facility] Heart Panel no                          Family history of premature CAD/ MI:no; PGF MI @ 21                                                                               Diabetes : see above   Smoking : 1 ppd    HTN: not monitored     Weight : up 7#                ROS: fatigue: no ; chest pain : no ;claudication: no; palpitations: no; abd pain/bowel changes: no ; myalgias:no;  syncope : no ; memory loss: no;skin changes: no   Labs reviewed & risks (uncontrolled DM, TG, smoking) discussed  Review of Systems see above     Objective:   Physical Exam on exam he exhibits no acute distress. He does appear healthy; weight is excess.  Pupils are equal round reactive to light; there is no conjunctival inflammation. There is no scleral icterus.  Thyroid normal to palpation without nodules or enlargement  Chest is clear to auscultation. Breath sounds are decreased  He exhibits an S4 without any significant murmur, gallop, or dysrhythmia.  Abdomen is protuberant but nontender. No organomegaly or masses  The tendon reflexes are normal. Sensation to light touch is normal over  the feet.  All  pulses are intact and there are no bruits.  Skin reveals no significant lesions. Nail health is good. He does exhibit pes planus.  He is intelligent and understands the risks..    Clinically there is no  sign of depression or anxiety   Assessment and plan:    #1 diabetes mellitus type 2, uncontrolled. Risk is increased approximately 75% for premature cardiovascular neurovascular event  #2 smoker, one pack per day. Risk is increased 2 to 3 times normal.  #  3 LDL essentially at goal. Excessively high triglycerides  Due  to dietary noncompliance  #4 medical noncompliance/adherence with his metformin.  Plan: #1 it is essential that he take the metformin twice a day if there's to be adequate diabetes control.  Nutritional intervention is critical component as well.  He has the "Sugar Busters" book. I encouraged him to use this or the book "Eat This,  Don't Eat That". These would be the easiest programs follow since he travels.  His A1c  and fasting lipids should be rechecked in 10 weeks.         Assessment & Plan:

## 2010-06-20 NOTE — Patient Instructions (Signed)
The A1c test checks the average amount of glucose (sugar) in the blood over the last 2 to 3 months.As glucose circulates in the blood, some of it binds to hemoglobin A,the main form of hemoglobin in adults. This combination of glucose and hemoglobin A is called A1c (or hemoglobin A1c or glycohemoglobin).   The goal of those with diabetes is to keep their blood glucose levels as close to normal as possible. This helps to minimize the complications caused by chronically elevated glucose levels, such as progressive damage to body organs like the kidneys, eyes, cardiovascular system, and nerves.    NORMAL VALUES  Non diabetic adults: 5 %-6.1%  Good diabetic control: 6.2-6.4 %  Fair diabetic control: 6.5-7%  Poor diabetic control: greater than 7 % ( except with additional factors such as  advanced age; significant coronary or neurologic disease,etc). Check the A1c every 6 months if it is < 6.5%; every 4 months if  6.5% or higher.  FBS goal is 90-150; 2 hrs after any meal < 180.

## 2010-07-23 NOTE — H&P (Signed)
NAME:  Brandon Rubio, Brandon Rubio NO.:  [id number]   MEDICAL RECORD NO.:  [id number]          PATIENT TYPE:  INP   LOCATION:  2911                         FACILITY:  [facility]   PHYSICIAN:  Audery Amel, MD    DATE OF BIRTH:  [birth year]   DATE OF ADMISSION:  12/11/2007  DATE OF DISCHARGE:                              HISTORY & PHYSICAL   REASON FOR CONSULTATION:  Non-ST-elevation MI.   HISTORY OF PRESENT ILLNESS:  Brandon Rubio is a 60 year old white male  with a past medical history notable for GERD and diet-controlled type 2  diabetes who presented to the emergency department late yesterday  evening with a chief complaint of chest discomfort.  The patient states  it began yesterday afternoon at lunch.  He states that he has  experienced symptoms since the onset of indigestion while he was eating  lunch.  He states that he has experienced similar symptoms before which  he attributed to indigestion.  He did not finish his [employer]  sandwich, and went back to work.  He characterized a dull burning  substernal discomfort which did not radiate into the arm, neck, jaw or  back.  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  He did not note any worsening of his  symptoms with exertion.  Later on in the evening, his symptoms  accelerated and became worse, and therefore, he presented to the  emergency department for evaluation.  On initial evaluation, it was felt  that the patient had community-acquired pneumonia, and he is admitted to  the medicine service and initiated on antibiotics.  Serial cardiac  biomarkers were obtained which initially revealed troponin of 0.15 and  CK-MB 4.  His subsequent set of cardiac enzymes increased, however, to a  troponin I of 1.26 and CK-MB 32.4.  He continued to experience mild  substernal chest discomfort which was improved with morphine.  On review  of his EKG, there is normal sinus rhythm  with no evidence of acute  injury or ischemia.  He was transferred to the CCU for further  evaluation and management.   PAST MEDICAL HISTORY:  1. Diet-controlled type 2 diabetes.  2. GERD.  3. History of right ankle fracture.   CURRENT MEDICATIONS:  1. Multivitamin once daily.  2. Vicodin p.r.n.   SOCIAL HISTORY:  The patient lives in [place].  He is an Personnel officer  by trade.  He smokes approximately 1-1/2 packs of cigarettes daily.  He  endorses occasional alcohol use but denies any illicit substance.   FAMILY HISTORY:  There is no general history of coronary artery disease  in his family.  His father died presumably from lung cancer, and his  mother has recently been diagnosed with lung cancer.  His siblings are  alive and well with no major medical problems.   REVIEW OF SYSTEMS:  As per HPI, otherwise a complete review of system  was obtained and is negative except as documented.   PHYSICAL EXAMINATION:  VITAL SIGNS:  Blood pressure is 121/76, heart  rate 62, O2 sats 98%  on 2L nasal cannula.  GENERAL:  He is alert and oriented x3 in no acute distress, pleasant and  conversant.  HEENT: Normocephalic, atraumatic, EOMI, PERL, nares patent, OMP is clear  without erythema or exudate.  NECK:  Supple, full range of motion, no appreciable JVD.  Carotid  upstrokes are equal and symmetric bilaterally with no audible bruits.  There is no palpable thyromegaly or lymphadenopathy.  CHEST:  Reveals bibasilar crackles and occasional end-expiratory  wheezing.  Otherwise clear to auscultation.  CARDIOVASCULAR:  Normal S1, S2 with no audible murmurs, rubs or gallops.  PMI is nondisplaced in the left midclavicular line.  Peripheral pulses  are 2+ and symmetric bilaterally.  ABDOMEN:  Obese, soft, nontender and nondistended, positive bowel  sounds.  No hepatosplenomegaly.  EXTREMITIES:  Reveal no clubbing, cyanosis or edema.  No evidence of  inflammation or ulceration.  NEUROLOGIC:   Grossly nonfocal.  PSYCHIATRIC:  Appropriate insight and judgment.   DATA REVIEW:  EKG normal sinus rhythm with no evidence of acute injury  current or ischemia.   CHEST X-RAY:  Right lower lobe bronchopneumonia.   LABS:  Sodium 138, potassium 3.9, chloride 105, BUN is 17, creatinine  1.2, glucose 143 white count 6, hemoglobin 14, hematocrit 39.7, platelet  count 204,000.  Lipid profile, total cholesterol 171, triglycerides 166,  HDL 26, LDL is 112.  Cardiac enzymes, CK-MB is 53, troponin I 1.78.   IMPRESSION:  1. Non-ST-elevation myocardial infarction.  2. Right lower lobe bronchopneumonia.  3. Type 2 diabetes.  4. Hyperlipidemia.   PLAN:  The patient has been transferred to the CCU for further  evaluation and monitoring.  Currently he is hemodynamically stable and  chest pain free.  On review of his EKG, there is no acute injury  current.  However, based on his serial cardiac enzymes, he has clearly  had a non-ST-elevation MI.  He has been initiated on enoxaparin and we  will continue with this mode of antithrombin therapy.  He has been  treated with aspirin, and of note, does not take aspirin on a daily  basis.  There are no dynamic ST changes, and therefore, I do not feel a  2B/3A inhibitor is warranted.  It would certainly be reasonable to  initiate clopidogrel loading dose and 75 mg once daily given his non-ST-  elevation MI.  The patient will require cardiac catheterization during  this hospitalization.  We will initiate atorvastatin 80 mg once daily  and continue metoprolol and lisinopril at the current dosages.  The  patient did note a significant smoking history.  He would benefit from  counseling regarding smoking cessation.  We appreciate the opportunity  to participate in the care Brandon Rubio, and will follow along closely  with you throughout his hospital course.  Please feel free to contact us  with any additional questions.      Audery Amel, MD   Electronically Signed     SHG/MEDQ  D:  12/12/2007  T:  12/12/2007  Job:  [id number]

## 2010-07-23 NOTE — H&P (Signed)
NAME:  Brandon Rubio, LEVERICH NO.:  [id number]   MEDICAL RECORD NO.:  [id number]          PATIENT TYPE:  EMS   LOCATION:  MAJO                         FACILITY:  [facility]   PHYSICIAN:  Donalynn Furlong, MD      DATE OF BIRTH:  12-12-1950   DATE OF ADMISSION:  12/11/2007  DATE OF DISCHARGE:                              HISTORY & PHYSICAL   PRIMARY CARE Jay Kempe:  Titus Dubin. Alwyn Ren, MD, FACP, FCCP, [employer].   CHIEF COMPLAINT:  Shortness of breath, chest pain.   HISTORY OF PRESENT ILLNESS:  Mr. Nikan Ellingson is a 60 year old  Caucasian male who lives in [place].  He presented to [facility] ER  today with a complaint of chest pain that started after lunch round 1:00  o'clock.  He was sitting and his chest started hurting in the precordial  area.  Chest pain was pressure type associated with feeling of shortness  of breath and radiation of pain in both upper extremities.  He also felt  that both upper extremities were numb.  His chest pain is continuous.  Still present in the ER when I saw him.  Chest pain is relieved some  with morphine, otherwise it is still pressure type.  He denies any  correlation with gastroesophageal reflux disease.  He mentions that he  has history of gastroesophageal reflux disease for about 5-6 years, and  he has a burning pain in his stomach and in the center of his chest if  he does not take his omeprazole daily.  He takes omeprazole every day to  prevent his acid reflux.  The patient denies any history of clots in the  lungs, pneumonia in the past or sick contacts.  The patient does have a  blood pressure intact with no history of gallstones.  The patient denies  any history of coronary artery disease or any prior cardiac workup.  He  denied seeing any cardiologist in the past either.  The patient  continues to smoke about one pack per day for many year and he used to  drink alcohol in the past.  The patient denied any nausea,  vomiting,  diarrhea, abdominal pain, urinary complaints, leg swelling, headache,  nasal symptoms, visual or ear problem today.   PAST MEDICAL HISTORY:  1. Gastroesophageal reflux disease.  2. History of right lower extremity DVT in 2003.  3. Right ankle fracture.  4. Tobacco use, ongoing.  5. History of perirectal abscess.  6. Alcohol use.   ALLERGIES:  ALLERGY TO PENICILLIN CAUSING SHORTNESS OF BREATH WHEN HE  WAS A KID, AND THAT IS ALL HE REMEMBERS.  NO HISTORY OF TAKING  PENICILLIN OR PENICILLIN DERIVATIVES IN THE PAST.   HOME MEDICATION:  Omeprazole and Vicodin as needed.   REVIEW OF SYSTEMS:  Positive as per HPI, otherwise negative.   REVIEW OF SYSTEMS:  Done for 14 systems.   FAMILY HISTORY:  Unremarkable.   SOCIAL HISTORY:  The patient continues to smoke.  He used to drink  heavily in the past.  The patient lives in [place].   PHYSICAL EXAMINATION:  VITAL SIGNS:  Blood pressure 159/84, pulse 88,  respirations 17, temperature 98.10, oxygen saturation 99% on room air.  GENERAL:  Alert and oriented x3.  Not in acute distress.  Laying in bed  without any trouble.  CARDIOVASCULAR:  S1, S2 regular.  No murmur, rub or gallop.  LUNGS: Clear to auscultation bilaterally, anteriorly and posteriorly.  No wheezing, rhonchi, crackles.  ABDOMEN:  Nontender, nondistended.  Bowel sounds present.  EXTREMITIES:  No clubbing, cyanosis or edema.  Pulses palpable in all  four extremities.  HEENT:  Head:  Normocephalic, nontraumatic.  Eyes:  Pupils equal, round  and reactive to light and accommodation.  Extraocular muscles intact.  Oral cavity:  Oral mucosa moist.  No thrush noted.  No thyromegaly or  JVD.  SKIN:  No rash or bruits.  NEUROLOGICAL:  Exam shows intact cranial nerves, muscular strength,  sensation and reflexes.   LABORATORY DATA:  EKG with normal sinus rhythm, normal axis, no acute ST-  T changes.  No previous EKG to compare.  CBC with differential shows normal WBC  count.  Hemoglobin is normal  along with platelets.  Troponin first set shows elevated, troponin 0.12  along with myoglobin of 265.  Basic metabolic panel unremarkable except  glucose of 143.  Chest x-ray shows right lower lobe bronchopneumonia and  borderline heart size.   ASSESSMENT AND PLAN:  1. Worsening shortness of breath.  Chest x-ray with infiltrate and      pleuritic chest pain suggestive of community-acquired pneumonia.      We cannot exclude secondary myocardial damage due to pneumonia at      this time.  2. Chest pain, rule out MI.  3. History of gastroesophageal reflux disease.  4. History of right lower extremity DVT in 2003, after right ankle      fracture.  5. Ongoing tobacco use.  6. History of alcohol use.   PLAN:  Will admit the patient to telemetry bed under Dr. Rene Paci with the diagnosis of community-acquired pneumonia and chest  pain, rule out MI.  The patient is full code.  We will check vitals and  input/output every 4-hours.  Will start cardiac diet.  Keep him n.p.o.  after midnight.  Will check CBC with differential, fasting lipid  profile.  EKG in the morning.  We will check hemoglobin A1c now.  We  will check cardiac enzymes couple more sets at midnight and 6 o'clock in  the morning.  Will start Avelox 400 mg IV daily now for pneumonia  coverage as he is allergic to penicillin, and we cannot confirm that it  is actually allergy or not.  We will check blood cultures x2,  urinalysis, urine cultures, sputum gram stain culture, urine Legionella  antigen, pneumococcal antigen now.  We will check PA and lateral chest x-  ray in the morning to see improvement.  Will start a.m. chest pain, rule  out MI protocol with aspirin 81 mg, metoprolol 25 mg b.i.d., lisinopril  5 mg daily, IV morphine, IV Zofran p.r.n. as needed and Lovenox 1 mg/kg  subcutaneously q.12 h.  We will also provide sublingual nitroglycerin  tablet every 5 minutes  along with nitro  paste one inch q. 6 hours and IV Nexium for GI  prophylaxis.  Further plan includes cardiac workup, when pneumonia is  resolving, including stress test and echocardiogram of which we repeat  ordered, as we get the cardiac enzyme workup tonight.      Donalynn Furlong, MD  Electronically  Signed     TVP/MEDQ  D:  12/11/2007  T:  12/12/2007  Job:  [id number]   cc:   Vikki Ports A. Felicity Coyer, MD  Titus Dubin. Alwyn Ren, MD,FACP,FCCP

## 2010-07-23 NOTE — Cardiovascular Report (Signed)
NAME:  JOANNE, BRANDER           ACCOUNT NO.:  [id number]   MEDICAL RECORD NO.:  [id number]          PATIENT TYPE:  INP   LOCATION:  2911                         FACILITY:  [facility]   PHYSICIAN:  Everardo Beals. Juanda Chance, MD, FACCDATE OF BIRTH:  [birth year]   DATE OF PROCEDURE:  12/13/2007  DATE OF DISCHARGE:                            CARDIAC CATHETERIZATION   CLINICAL HISTORY:  Mr. Dames is 60 years old and has a history of  tobacco use, diabetes, and hyperlipidemia.  He was admitted to the  hospital with chest pain and his enzymes returned positive for a non-ST-  elevation myocardial infarction.  His electrocardiogram showed small  inferior Q-waves but no evolution of ST changes.  He was scheduled for  evaluation with angiography.   PROCEDURE:  The procedure was performed by the right femoral artery, an  arterial sheath, and 5-French preformed coronary catheters.  A front-  wall arterial puncture was performed, and Omnipaque contrast was used.  The patient tolerated the procedure well and left the laboratory in  satisfactory condition.   RESULTS:  Aortic pressure is 118/70 with mean of 89.  Left index  pressure is 118/14.   The left main coronary artery:  Left main coronary artery is free of  significant disease.   Left anterior descending artery:  The left anterior descending artery  gave rise to 2 diagonal branches and a septal perforator.  In the very  distal portion of the LAD near the apex, there was a 99% stenosis with  TIMI 1 flow.   The circumflex artery:  Circumflex artery gave rise to a ramus branch, a  marginal branch, 2 posterolateral branches, and a posterior descending  branch.  There is 50% narrowing in the posterior descending branch.  There was 40% narrowing in the subbranch of the ramus branch.   The right coronary artery:  The right coronary artery is a small  codominant vessel gave rise to a conus branch, right ventricular branch,  and a very short posterior  descending, short posterolateral branch.  This vessel is irregular, but there is no major obstruction.   The left ventriculogram:  The left ventriculogram performed in the RAO  projection showed a small area of apical, inferoapical wall akinesis.  The overall wall motion is good with an estimated ejection fraction of  60%.   CONCLUSION:  Coronary artery disease with recent non-ST-elevation  myocardial infarction with 99% stenosis in the distal LAD with TIMI 1  flow, 40% stenosis in the ramus branch of the circumflex artery, and 50%  stenosis in the posterior descending branch of the circumflex artery  (codominant) with irregularities in the right coronary artery, and a  small area of inferoapical wall akinesis with an estimated ejection  fraction of 60%.   The culprit vessel is the distal LAD, which is a small vessel at this  point.  I think medical therapy is indicated and continued aggressive  secondary risk factor modification.      Bruce Elvera Lennox Juanda Chance, MD, [facility]  Electronically Signed     BRB/MEDQ  D:  12/13/2007  T:  12/14/2007  Job:  [id number]  cc:   Titus Dubin. Alwyn Ren, MD,FACP,FCCP  Cardiopulmonary Lab

## 2010-07-23 NOTE — Assessment & Plan Note (Signed)
[facility]                            CARDIOLOGY OFFICE NOTE   MARGUIS, MATHIESON                  MRN:          [id number]  DATE:12/29/2007                            DOB:          Aug 06, 1950    CLINICAL HISTORY:  Mr. Diana is a 60 year old and was recently  admitted with chest pain and non-ST-elevation myocardial infarction.  He  underwent catheterization and was found to have 99% stenosis in the very  distal LAD with TIMI 1 flow and he had 40% narrowing in the ramus and  50% narrowing in the posterior descending branch of the circumflex  artery with irregularities in the right coronary artery and small area  of inferoapical akinesis in an estimated fraction of 60%.  We  recommended medical therapy.   He has done well since discharge with no recurrent cardiac symptoms.  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  He has seen Dr.  Alwyn Ren since discharge who working on manage his diabetes, smoking, and  hyperlipidemia.   PAST MEDICAL HISTORY:  Significant for diabetes, recent pneumonia,  hyperlipidemia, and GERD.   CURRENT MEDICATIONS:  Simvastatin 40 mg daily, Glucophage, Plavix,  metoprolol 50 mg daily, aspirin, and ranitidine.   PHYSICAL EXAMINATION:  VITAL SIGNS:  Today, the blood pressure is 107/70  and pulse 89 regular.  NECK:  There is no venous distention.  The carotid pulses were full and  there were no bruits.  CHEST:  Clear.  HEART:  Rhythm is regular.  No murmurs or gallops.  ABDOMEN:  Soft, normal bowel sounds.  Peripheral pulses were full.  There was no peripheral edema.   IMPRESSION:  1. Recent non-ST-elevation myocardial infarction with a 99% stenosis      of distal left anterior descending artery and nonobstructive      disease with the recommendations for medical treatment.  2. Good left ventricular function.  3. Diabetes.  4. Hyperlipidemia.  5. Tobacco use.  6. Excess weight.   RECOMMENDATIONS:  Mr. Harn, I think he is stable from cardiac  standpoint.  His infarct was very small and I think he should do well  from the standpoint of his infarct and he should be able to proceed with  normal activities.  The main issues are with a preventive care and he  still has a long way to go with this and Dr. Alwyn Ren is working with him  on this to lose weight and discontinue the cigarettes.  The latest  status shows that PPI can be used with Plavix  and he says that Prilosec works much better for him than ranitidine, so  we will plan to let him take over-the-counter Prilosec.  I will plan to  see him back in 6 months.     Bruce Elvera Lennox Juanda Chance, MD, [facility]  Electronically Signed    BRB/MedQ  DD: 12/29/2007  DT: 12/29/2007  Job #: [id number]

## 2010-07-23 NOTE — Discharge Summary (Signed)
NAME:  Brandon Rubio, Brandon Rubio           ACCOUNT NO.:  [id number]   MEDICAL RECORD NO.:  [id number]          PATIENT TYPE:  INP   LOCATION:  2911                         FACILITY:  [facility]   PHYSICIAN:  Raenette Rover. Felicity Coyer, MDDATE OF BIRTH:  1951-01-28   DATE OF ADMISSION:  12/11/2007  DATE OF DISCHARGE:  12/14/2007                               DISCHARGE SUMMARY   DIAGNOSES AT THE TIME OF DISCHARGE:  1. Non-ST-elevation myocardial infarction status post cardiac      catheterization on December 13, 2007, with distal left anterior      descending lesion and plans for medical management per Cardiology.  2. Diabetes type 2.  3. Ongoing tobacco abuse.  4. Pneumonia, currently stable on Avelox.   HISTORY OF PRESENT ILLNESS:  Brandon Rubio is a 60 year old male who  was admitted on December 11, 2007, with chief complaint of shortness of  breath and chest pain.  He was noted to have a pneumonia on chest x-ray  and was admitted for community-acquired pneumonia and to undergo serial  cardiac enzymes to rule out MI.   COURSE OF HOSPITALIZATION:  1. Non-ST-elevation MI secondary to distal LAD lesion.  The patient      was admitted.  He underwent cardiac catheterization, which was      performed on December 14, 2007.  This noted distal LAD lesion, which      was felt appropriate for med management.  He will be discharged to      home on aspirin and Plavix and a statin as well as a nicotine      patch.  2. Diabetes type 2.  The patient was noted to have a hemoglobin A1c of      6.7 in the setting of coronary artery disease.  We will add      Glucophage to start on December 16, 2007, which is 48 hours post      contrast in order to try to meet A1c goal.  It was felt by      Cardiology that the patient did not need an ACE inhibitor for BP      control.  We will defer starting this medication for renal      protection to  the patient's primary MD.  3. Pneumonia.  The patient was noted to have infiltrate on  x-ray.  He      has a normal white blood cell count, is afebrile.  His O2 sats are      100% on room air.  At this time, we will transition him over to      oral Avelox to complete a total of a 7-day course.   PERTINENT LABORATORY DATA:  At the time of discharge, BUN 10, creatinine  0.81, hemoglobin 13, hematocrit 37.6, troponin 1.26 on December 12, 2007.   MEDICATIONS AT THE TIME OF DISCHARGE:  1. Plavix 75 mg p.o. daily.  2. Nicotine patch 21 mg change daily.  The patient is instructed to      apply patches and followup program, which is available over the      counter.  3. Simvastatin 40  mg p.o. daily.  4. Aspirin 325 mg p.o. daily.  5. Avelox 400 mg p.o. daily for 5 days.  6. Glucophage 500 mg p.o. b.i.d. to be started on December 16, 2007.  7. Glucose meter test strips.  8. Omeprazole 20 mg p.o. daily as needed.  9. Toprol-XL 50 mg p.o. daily.  10.Prescription provided for lancets and test strips.   FOLLOWUP:  The patient is to follow up with Dr. Marga Melnick on  Thursday, December 23, 2007, at 1:30 p.m.  In addition, he is to follow  up with [facility]iology, Dr. Charlies Constable in approximately 2 weeks.  He is instructed to call Cardiology should he develop swelling, oozing,  or bleeding from his groin site.  He is instructed to check his blood  sugars twice daily and contact Dr. Alwyn Ren should he run sugars less than  80 or greater than 300 or should he develop fever greater than 101 or  weakness.  He is to return to the ER should he develop recurrent chest  pain.    Greater than 30 minutes was spent on discharge planning.      Sandford Craze, NP      Raenette Rover. Felicity Coyer, MD  Electronically Signed    MO/MEDQ  D:  12/14/2007  T:  12/14/2007  Job:  [id number]   cc:   Titus Dubin. Alwyn Ren, MD,FACP,FCCP  Everardo Beals Juanda Chance, MD, [facility]

## 2010-07-26 NOTE — Op Note (Signed)
NAME:  EDMOND, GINSBERG NO.:  [id number]   MEDICAL RECORD NO.:  [id number]          PATIENT TYPE:  INP   LOCATION:  2009                         FACILITY:  [facility]   PHYSICIAN:  Gabrielle Dare. Janee Morn, M.D.DATE OF BIRTH:  11-Jun-1950   DATE OF PROCEDURE:  07/04/2004  DATE OF DISCHARGE:                                 OPERATIVE REPORT   PREOPERATIVE DIAGNOSIS:  Left perirectal abscess.   POSTOPERATIVE DIAGNOSIS:  Left perirectal abscess.   PROCEDURE:  Incision and drainage of left perirectal abscess.   SURGEON:  Violeta Gelinas, MD   HISTORY OF PRESENT ILLNESS:  The patient is a 60 -year-old white male with a  7-day history of increasing pain in his left perirectal region.  He was  admitted to Dr. Frederik Pear service for IV antibiotics and treatment of this  perirectal abscess, and treatment of his diabetes.   PROCEDURE IN DETAIL:  Informed consent was obtained.  The patient's  perirectal region was prepped and draped in a sterile fashion; 2% lidocaine  with epinephrine was injected in the area of fluctuance for a local  anesthetic.  Subsequently, a 2-cm incision was made along the area of  fluctuance.  The area was probed and a large volume of purulent material was  released; it was foul-smelling; there were at least 50-60 mL released.  Once  this was completely removed, the wound was irrigated out and subsequently  packed with Iodoform gauze, a sterile dressing was applied and the patient  tolerated the procedure well and we sent cultures to the lab.      BET/MEDQ  D:  07/04/2004  T:  07/05/2004  Job:  [id number]

## 2010-07-26 NOTE — H&P (Signed)
NAME:  Brandon Rubio, Brandon Rubio NO.:  [id number]   MEDICAL RECORD NO.:  [id number]          PATIENT TYPE:  INP   LOCATION:  2009                         FACILITY:  [facility]   PHYSICIAN:  Titus Dubin. Alwyn Ren, M.D. [facility] OF BIRTH:  1950/11/09   DATE OF ADMISSION:  07/04/2004  DATE OF DISCHARGE:                                HISTORY & PHYSICAL   Brandon Rubio is a 60 year old white male with newly diagnosed diabetes  who is admitted to the hospital with probable left perirectal abscess.   While in [place] as part of his job requirements, he was seen by a  urologist for the pain and fever.  He was placed on Proctofoam HC, Sitz  baths and Levaquin 500 mg daily as of July 01, 2004.   Apparently this visit was scheduled through the courtesy of the girlfriend  of a coworker who worked for the urologist.  He was taking hydrocodone one  every 4 hours left over from a previous ankle fracture.  He now presents  with fever, tachycardia and diaphoresis with an exquisitely tender left  perirectal fluctuant mass suggesting perirectal abscess.   His past medical history includes deep venous thrombosis in January 2003.  In 1983 he had a pneumothorax.  He has also had a herniorrhaphy.  The deep  venous thrombosis was in the context of an ankle fracture.   The family history is negative for deep venous thrombosis or pulmonary  thromboemboli except for a DVT in an aunt.  His mother has had pulmonary  malignancy.  His father has end-stage lung disease, cor pulmonale and  asbestosis.   He is allergic to PENICILLIN.  He continues to smoke but is attempting to  wean.  He has averaged a pack per day.   He was seen June 20, 2004 because of a glossitis suggesting Candida.  He  described mouth dryness and constant thirst and frequent urination.  He was  on no diet at that time.  He was drinking three to four quarts of Sprite per  day.   Hemoglobin A1C was 10% indicating uncontrolled  diabetes.  He had minimal  microalbuminuria of 0.2.  BUN, creatinine and potassium were normal at that  time.   Carbohydrate restriction with respect to medicine and he was placed on  Avandaryl 4/2 each morning.  He was also given Mycelex Troches.   He states that fasting glucose was 135 on July 03, 2004.  He has been  unable to eat because of the acute illness.   He denies any chest pain or shortness of breath.  He has had no specific  urinary symptoms at this time except for extremely dark yellow urine.   On exam he appears acutely ill with temperature of 100; he states he had  just taken Tylenol.  Pulse is 120, respiratory rate 25 and blood pressure  128/80, weight was 238.  Hair is damp and matted due to diaphoresis.  His  shirt is stained with sweat.  Otolaryngologic exam is unremarkable.  He has  minor arteriolar changes.  Chest is clear.  An S4 is noted with a flow  murmur.  Dorsalis pedis pulses are decreased.  He has no edema.  Abdomen is  nontender.  Rectal could not be completed because of pain with examination  of the perirectal area.  An erythematous fluctuant mass was palpated in the  left buttock suggesting perirectal abscess.  There were no localizing  neuropsychiatric deficits although his speech was slightly slurred possibly  related to his pain medications.   He will be admitted for cultures, broad-spectrum antibiotic coverage with  gentamicin and clindamycin and parenteral meds for pain control if he fails  to have adequate control with hydrocodone.  General surgery consult will  also be pursued.      WFH/MEDQ  D:  07/04/2004  T:  07/04/2004  Job:  [id number]   cc:   Gabrielle Dare. Janee Morn, M.D.  [facility]  [address] [place], [place] [address]

## 2010-07-26 NOTE — Discharge Summary (Signed)
[place]  Patient:    Brandon Rubio, Brandon Rubio Visit Number: [id number] MRN: [id number]          Service Type: MED Location: [phone] Attending Physician:  Rogelia Boga Dictated by:   Cornell Barman, P.A. Admit Date:  03/30/2001 Discharge Date: 03/31/2001   CC:         Sharlot Gowda., M.D.  Titus Dubin. Alwyn Ren, M.D. [facility]   Discharge Summary  DISCHARGE DIAGNOSIS:  Right posterior tibial vein deep venous thrombosis.  HISTORY OF PRESENT ILLNESS:  [name] is a 60 year old white male who recently sustained a right ankle fracture.  He was casted by Dr. Madelon Lips on January 5.  Since that time he has had pain and swelling.  Lower extremity venous Dopplers on the day of admission revealed a right posterior tibial vein DVT.  The patient was admitted for further evaluation and treatment.  PAST MEDICAL HISTORY:  Unremarkable except for recent right ankle fracture. He does have a history of right thigh muscle repair sustained during a work injury three years ago.  The patient does smoke.  HOSPITAL COURSE:  #1 - DVT of the entire right posterior tibial vein.  The patient has been started on Lovenox and Coumadin and has received instruction on self administration of Lovenox.  DISCHARGE LABORATORY DATA:  Pro-time was 13.9 and INR was 1.1.  CBC and BMET were normal.  DISCHARGE MEDICATIONS: 1. Lovenox 110 mcg injected subcutaneously b.i.d. for 5 to 7 days. 2. Coumadin 5 mg 1-1/2 tablets on Wednesday and Thursday and then continue    as instructed by Dr. Alwyn Ren.  FOLLOW-UP:  He will follow up for a pro-time INR on Friday, April 02, 2001, at Dr. Caryl Never office.  Follow up with Dr. Alwyn Ren in two to three weeks. Dictated by:   Cornell Barman, P.A. Attending Physician:  Rogelia Boga DD:  03/31/01 TD:  04/01/01 Job: [id number]

## 2010-07-26 NOTE — Consult Note (Signed)
NAME:  Brandon Rubio, NISHI NO.:  [id number]   MEDICAL RECORD NO.:  [id number]          PATIENT TYPE:  INP   LOCATION:  2009                         FACILITY:  [facility]   PHYSICIAN:  Gabrielle Dare. Janee Morn, M.D.DATE OF BIRTH:  [birth year]   DATE OF CONSULTATION:  07/04/2004  DATE OF DISCHARGE:                                   CONSULTATION   REASON FOR CONSULTATION:  Perirectal abscess.   HISTORY OF PRESENT ILLNESS:  The patient is a 60 year old white male with a  history of diabetes mellitus, who complains of a seven-day history of  increasing perirectal pain on the left side.  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  He  saw Dr. Alwyn Ren in the office, had worsening fevers, and was admitted to the  hospital with perirectal abscess.   PAST MEDICAL HISTORY:  Diabetes mellitus, DVT.   PAST SURGICAL HISTORY:  Hernia repair as a child.   ALLERGIES:  PENICILLIN.   White blood cell count is 16,000.   REVIEW OF SYSTEMS:  CARDIAC:  Negative.  PULMONARY:  Negative.  GASTROINTESTINAL:  Limited appetite.  GENITOURINARY:  Negative.  MUSCULOSKELETAL:  See the history of present illness.   PHYSICAL EXAMINATION:  VITAL SIGNS:  Temperature is 100, pulse is 120.  CHEST:  Lungs are clear to auscultation.  CARDIAC:  Heart is regular rate and rhythm.  ABDOMEN:  Soft and nontender.  RECTAL:  The perirectal region has a moderate area of fluctuance and  induration along the left buttock cheek and perirectal region.  This  induration extends to the area posterior to the anal opening.  The area is  very tender, which did not allow more thorough rectal examination.   IMPRESSION:  1.  Perirectal abscess.  2.  Diabetes mellitus.   PLAN:  We will perform incision and drainage of this abscess.  We will plan  to pack with iodoform and change this twice a day  starting tomorrow.  I also  agree with broad-spectrum IV antibiotics.  We will send a culture, and we  will follow along with you.   Thank you very much for this consultation.      BET/MEDQ  D:  07/04/2004  T:  07/05/2004  Job:  [id number]

## 2010-07-26 NOTE — Discharge Summary (Signed)
NAME:  Brandon Rubio, Brandon Rubio           ACCOUNT NO.:  [id number]   MEDICAL RECORD NO.:  [id number]          PATIENT TYPE:  INP   LOCATION:  5730                         FACILITY:  [facility]   PHYSICIAN:  Rene Paci, M.D. LHCDATE OF BIRTH:  1950/08/28   DATE OF ADMISSION:  07/04/2004  DATE OF DISCHARGE:  07/09/2004                                 DISCHARGE SUMMARY   DISCHARGE DIAGNOSES:  1.  Perirectal abscess.  2.  Diabetes type 2.   HISTORY OF PRESENT ILLNESS:  Patient is a 60 year old white male with a  recent diagnosis of diabetes type 2 who presented with complaints of a seven-  day history of increasing perirectal pain.  Patient was seen by Dr. Alwyn Ren  in the office and had worsening fevers.  Patient was admitted to the  hospital with perirectal abscess and surgical team was consulted.   PAST MEDICAL HISTORY:  1.  Diabetes type 2.  2.  DVT.   HOSPITAL COURSE:  Patient was admitted on July 04, 2004.  Wound was  cultured.  No specific organisms grew in wound culture.  Patient was started  on antibiotics, gentamicin and clindamycin.  Patient underwent incision and  drainage by surgical team on July 04, 2004.  Patient was closely monitored  for diabetes.  Finger sticks were stable during admission.  Patient was  maintained on sliding scale insulin coverage as needed.  Patient remained  afebrile with a normal white count of 6.3 on discharge.  Plan to discharge  patient to home with p.o. Flagyl and Septra per surgery and patient to  continue x10 days.  In addition, patient is to follow up for outpatient  diabetes teaching.   DISCHARGE MEDICATIONS:  1.  Septra DS one tablet b.i.d. x10 days.  2.  Flagyl 500 mg three times daily x10 days.  3.  Daily stool softener.  4.  Protonix 40 mg p.o. daily.  5.  Avandia 4 mg p.o. daily.  6.  Vicodin one tablet p.o. q.4-6h. p.r.n.  7.  Ambien 5 mg p.o. h.s. p.r.n.   FOLLOW UP:  Patient is to follow up with Dr. Gabrielle Dare. Thompson in two  weeks  and to follow up with Dr. Alwyn Ren on Jul 16, 2004, at 12:45 p.m.      MSO/MEDQ  D:  07/09/2004  T:  07/09/2004  Job:  [id number]   cc:   Gabrielle Dare. Janee Morn, M.D.  [facility]  [address] [place], [place] [address]

## 2010-08-28 ENCOUNTER — Other Ambulatory Visit: Payer: Self-pay | Admitting: Internal Medicine

## 2010-08-28 DIAGNOSIS — E785 Hyperlipidemia, unspecified: Secondary | ICD-10-CM

## 2010-08-28 DIAGNOSIS — E119 Type 2 diabetes mellitus without complications: Secondary | ICD-10-CM

## 2010-08-29 ENCOUNTER — Other Ambulatory Visit (INDEPENDENT_AMBULATORY_CARE_PROVIDER_SITE_OTHER): Payer: BC Managed Care – PPO

## 2010-08-29 DIAGNOSIS — E785 Hyperlipidemia, unspecified: Secondary | ICD-10-CM

## 2010-08-29 DIAGNOSIS — E119 Type 2 diabetes mellitus without complications: Secondary | ICD-10-CM

## 2010-08-29 LAB — LIPID PANEL
LDL Cholesterol: 75 mg/dL (ref 0–99)
Total CHOL/HDL Ratio: 5
Triglycerides: 181 mg/dL — ABNORMAL HIGH (ref 0.0–149.0)
VLDL: 36.2 mg/dL (ref 0.0–40.0)

## 2010-08-29 NOTE — Progress Notes (Signed)
Labs only

## 2010-09-05 ENCOUNTER — Encounter: Payer: Self-pay | Admitting: Internal Medicine

## 2010-09-05 ENCOUNTER — Ambulatory Visit (INDEPENDENT_AMBULATORY_CARE_PROVIDER_SITE_OTHER): Payer: BC Managed Care – PPO | Admitting: Internal Medicine

## 2010-09-05 DIAGNOSIS — K219 Gastro-esophageal reflux disease without esophagitis: Secondary | ICD-10-CM | POA: Insufficient documentation

## 2010-09-05 DIAGNOSIS — F17200 Nicotine dependence, unspecified, uncomplicated: Secondary | ICD-10-CM

## 2010-09-05 DIAGNOSIS — E785 Hyperlipidemia, unspecified: Secondary | ICD-10-CM

## 2010-09-05 DIAGNOSIS — I2510 Atherosclerotic heart disease of native coronary artery without angina pectoris: Secondary | ICD-10-CM

## 2010-09-05 DIAGNOSIS — I10 Essential (primary) hypertension: Secondary | ICD-10-CM

## 2010-09-05 MED ORDER — METFORMIN HCL 500 MG PO TABS: 500.0000 mg | ORAL_TABLET | Freq: Two times a day (BID) | ORAL | Status: DC

## 2010-09-05 MED ORDER — SIMVASTATIN 40 MG PO TABS: 40.0000 mg | ORAL_TABLET | Freq: Every day | ORAL | Status: DC

## 2010-09-05 MED ORDER — METOPROLOL SUCCINATE ER 50 MG PO TB24: 50.0000 mg | ORAL_TABLET | Freq: Every day | ORAL | Status: DC

## 2010-09-05 MED ORDER — OMEPRAZOLE 20 MG PO CPDR: 20.0000 mg | DELAYED_RELEASE_CAPSULE | Freq: Every day | ORAL | Status: DC

## 2010-09-05 NOTE — Progress Notes (Signed)
  Subjective:    Patient ID: Brandon Rubio, male    DOB: 1950/11/15, 60 y.o.   MRN: [id number]  HPI #1  HYPERTENSION Disease Monitoring: Blood pressure range-not checked  Chest pain, palpitations- no      Dyspnea- no Medications: Compliance- yes  Lightheadedness,Syncope- no    Edema- no    #2 DIABETES Disease Monitoring: Blood Sugar ranges-checked rarely  Polyuria/phagia/dipsia- no       Visual problems- no Medications: Compliance- yes but he missed 2nd dose of metformin 10X in 45 ; he was  not taking 1st dose with breakfast  Hypoglycemic symptoms- no     #  3 HYPERLIPIDEMIA Disease Monitoring: See symptoms for Hypertension Medications: Compliance- yes  Abd pain, bowel changes- no   Muscle aches- no    ROS See HPI above   PMH Smoking Status noted: 1 -1.5 ppd     Review of Systems no other symptoms except numbness R great toe when barefooted     Objective:   Physical Exam Gen.: Healthy and well-nourished in appearance. Alert, appropriate and cooperative throughout exam. Eyes: No corneal or conjunctival inflammation noted.Neck: No deformities, masses, or tenderness noted. Thyroid normal. Lungs: Normal respiratory effort; chest expands symmetrically. Lungs are clear to auscultation without rales, wheezes, or increased work of breathing.BS slightly decreased Heart: Normal rate and rhythm. Normal S1 and S2. No gallop, click, or rub. S4 with slurring; no murmur. Abdomen: Bowel sounds normal; abdomen soft and nontender. No masses, organomegaly or hernias noted. No clubbing, cyanosis, edema, or deformity noted. Nail health  good. Vascular: Carotid, radial artery, dorsalis pedis and dorsalis posterior tibial pulses are full and equal. No bruits present. Neurologic: Alert and oriented x3. Deep tendon reflexes symmetrical and normal.          Skin: Intact without suspicious lesions or rashes. Psych: Mood and affect are normal. Normally interactive                                                                                          Assessment & Plan:  #1 diabetes; control improved  #2 hypertension, controlled  #3 dyslipidemia, LDL goal. Triglycerides are improved.  #4 smoking  #5 GERD  Plan: Over 30 minutes was spent in counseling &  instruction about cardiovascular risk. #1 is smoking. Preventive interventions were recommended.

## 2010-09-05 NOTE — Patient Instructions (Signed)
Preventive Health Care: Exercise at least 30-45 minutes a day,  3-4 days a week.  Eat a low-fat diet with lots of fruits and vegetables, up to 7-9 servings per day. Avoid obesity; your goal is waist measurement < 40 inches.Consume less than 40 grams of sugar per day from foods & drinks with High Fructose Corn Sugar as #2,3 or # 4 on label. Eye Doctor - have an eye exam @ least annually.                                                         Health Care Power of Attorney & Living Will. Complete if not in place ; these place you in charge of your health care decisions. The triggers for dyspepsia or "heart burn"  include stress; the "aspirin family" ; alcohol; peppermint; and caffeine (coffee, tea, cola, and chocolate). The aspirin family would include aspirin and the nonsteroidal agents such as ibuprofen &  Naproxen. Tylenol would not cause reflux. If having dyspepsia ; food & dink should be avoided for @ least 2 hors before going to bed.  Please  schedule  Fasting  Labs in 4 months : Lipids,ALT, AST, A1c ,urine microalbumin( 250.02, 272.4, 995.20)

## 2010-12-09 LAB — CBC
HCT: 39.7
HCT: 40.5
Hemoglobin: 13
Hemoglobin: 13.9
MCHC: 34.5
Platelets: 162
Platelets: 190
Platelets: 204
RDW: 12.8
RDW: 13
WBC: 5.9
WBC: 6

## 2010-12-09 LAB — GLUCOSE, CAPILLARY
Glucose-Capillary: 113 — ABNORMAL HIGH
Glucose-Capillary: 121 — ABNORMAL HIGH
Glucose-Capillary: 127 — ABNORMAL HIGH

## 2010-12-09 LAB — POCT I-STAT, CHEM 8
BUN: 17
Chloride: 105
Creatinine, Ser: 1.2
Potassium: 3.9
Sodium: 138
TCO2: 25

## 2010-12-09 LAB — URINALYSIS, ROUTINE W REFLEX MICROSCOPIC
Glucose, UA: NEGATIVE
Nitrite: NEGATIVE
Specific Gravity, Urine: 1.024
pH: 5.5

## 2010-12-09 LAB — CULTURE, BLOOD (ROUTINE X 2)

## 2010-12-09 LAB — CARDIAC PANEL(CRET KIN+CKTOT+MB+TROPI)
CK, MB: 53 — ABNORMAL HIGH
Relative Index: 9.4 — ABNORMAL HIGH
Troponin I: 1.26
Troponin I: 1.78

## 2010-12-09 LAB — URINE CULTURE
Colony Count: NO GROWTH
Culture: NO GROWTH

## 2010-12-09 LAB — BASIC METABOLIC PANEL
BUN: 10
BUN: 8
CO2: 24
Calcium: 8.3 — ABNORMAL LOW
Calcium: 8.5
Chloride: 107
Creatinine, Ser: 0.95
GFR calc Af Amer: >60
GFR calc non Af Amer: >60
GFR calc non Af Amer: >60
Glucose, Bld: 106 — ABNORMAL HIGH
Sodium: 141

## 2010-12-09 LAB — POCT CARDIAC MARKERS
CKMB, poc: 4
CKMB, poc: 4
Myoglobin, poc: 190
Troponin i, poc: 0.12 — ABNORMAL HIGH
Troponin i, poc: 0.15 — ABNORMAL HIGH

## 2010-12-09 LAB — DIFFERENTIAL
Basophils Absolute: 0
Eosinophils Relative: 2
Eosinophils Relative: 5
Lymphocytes Relative: 24
Lymphocytes Relative: 24
Lymphs Abs: 1.4
Lymphs Abs: 1.4
Neutro Abs: 3.5
Neutro Abs: 3.6

## 2010-12-09 LAB — PROTIME-INR
INR: 1
Prothrombin Time: 13.6

## 2010-12-09 LAB — LIPID PANEL
Cholesterol: 171
HDL: 26 — ABNORMAL LOW
LDL Cholesterol: 112 — ABNORMAL HIGH
Total CHOL/HDL Ratio: 6.6
Triglycerides: 166 — ABNORMAL HIGH

## 2010-12-09 LAB — LEGIONELLA ANTIGEN, URINE

## 2010-12-09 LAB — APTT: aPTT: 31

## 2010-12-27 ENCOUNTER — Other Ambulatory Visit: Payer: Self-pay | Admitting: Internal Medicine

## 2010-12-27 MED ORDER — METFORMIN HCL 500 MG PO TABS: 500.0000 mg | ORAL_TABLET | Freq: Two times a day (BID) | ORAL | Status: DC

## 2010-12-27 NOTE — Telephone Encounter (Signed)
RX sent in

## 2011-01-03 ENCOUNTER — Other Ambulatory Visit: Payer: Self-pay | Admitting: Internal Medicine

## 2011-01-03 DIAGNOSIS — E785 Hyperlipidemia, unspecified: Secondary | ICD-10-CM

## 2011-01-03 DIAGNOSIS — T887XXA Unspecified adverse effect of drug or medicament, initial encounter: Secondary | ICD-10-CM

## 2011-01-06 ENCOUNTER — Other Ambulatory Visit (INDEPENDENT_AMBULATORY_CARE_PROVIDER_SITE_OTHER): Payer: BC Managed Care – PPO

## 2011-01-06 DIAGNOSIS — E785 Hyperlipidemia, unspecified: Secondary | ICD-10-CM

## 2011-01-06 DIAGNOSIS — T887XXA Unspecified adverse effect of drug or medicament, initial encounter: Secondary | ICD-10-CM

## 2011-01-06 DIAGNOSIS — IMO0001 Reserved for inherently not codable concepts without codable children: Secondary | ICD-10-CM

## 2011-01-06 LAB — LIPID PANEL
Cholesterol: 132 mg/dL (ref 0–200)
Total CHOL/HDL Ratio: 4

## 2011-01-06 LAB — MICROALBUMIN / CREATININE URINE RATIO
Creatinine,U: 183.2 mg/dL
Microalb, Ur: 0.5 mg/dL (ref 0.0–1.9)

## 2011-01-06 LAB — ALT: ALT: 25 U/L (ref 0–53)

## 2011-01-06 LAB — LDL CHOLESTEROL, DIRECT: Direct LDL: 72.6 mg/dL

## 2011-01-06 LAB — HEMOGLOBIN A1C: Hgb A1c MFr Bld: 6.7 % — ABNORMAL HIGH (ref 4.6–6.5)

## 2011-01-06 NOTE — Progress Notes (Signed)
Labs only

## 2011-01-09 ENCOUNTER — Encounter: Payer: Self-pay | Admitting: Internal Medicine

## 2011-01-13 ENCOUNTER — Encounter: Payer: Self-pay | Admitting: Internal Medicine

## 2011-01-13 ENCOUNTER — Ambulatory Visit (INDEPENDENT_AMBULATORY_CARE_PROVIDER_SITE_OTHER): Payer: BC Managed Care – PPO | Admitting: Internal Medicine

## 2011-01-13 DIAGNOSIS — F17200 Nicotine dependence, unspecified, uncomplicated: Secondary | ICD-10-CM

## 2011-01-13 DIAGNOSIS — I10 Essential (primary) hypertension: Secondary | ICD-10-CM

## 2011-01-13 DIAGNOSIS — I2510 Atherosclerotic heart disease of native coronary artery without angina pectoris: Secondary | ICD-10-CM

## 2011-01-13 DIAGNOSIS — E785 Hyperlipidemia, unspecified: Secondary | ICD-10-CM

## 2011-01-13 DIAGNOSIS — IMO0001 Reserved for inherently not codable concepts without codable children: Secondary | ICD-10-CM

## 2011-01-13 MED ORDER — METFORMIN HCL 500 MG PO TABS: 500.0000 mg | ORAL_TABLET | Freq: Two times a day (BID) | ORAL | Status: DC

## 2011-01-13 NOTE — Progress Notes (Signed)
  Subjective:    Patient ID: Brandon Rubio, male    DOB: Jul 02, 1950, 60 y.o.   MRN: [id number]  HPI HYPERTENSION: Disease Monitoring:see today's BP of 148/88 Blood pressure range-not monitored  Chest pain, palpitations- no       Dyspnea- no Medications: Compliance- yes  Lightheadedness,Syncope- no    Edema- no  DIABETES: Disease Monitoring: A1c 6.7% , prev 7.4 (8.7% in 4/12) Blood Sugar ranges-FBS 80-170; no average  Polyuria/phagia/dipsia- no       Visual problems- occasional blurring; last  Ophth exam 12 mos ago Medications: Compliance- yes  Hypoglycemic symptoms- no  HYPERLIPIDEMIA: Disease Monitoring:TG up to 220 from 181; LDL 73 Medications: Compliance- yes  Abd pain, bowel changes- no   Muscle aches- no       Smoking Status noted: 1 ppd         Review of Systems     Objective:   Physical Exam Gen.:  well-nourished, appropriate and alert, weight excess centrally Eyes: No lid/conjunctival changes, extraocular motion intact, fundi: benign Neck: Normal range of motion, thyroid normal Respiratory: No increased work of breathing or abnormal breath sounds Cardiac : regular rhythm, no extra heart sounds, gallop, murmur Abdomen: No organomegaly ,masses, bruits or aortic enlargement Lymph: No lymphadenopathy of the neck or axilla Skin: No rashes, lesions, ulcers or ischemic changes. Good hair growth over toes Muscle skeletal: no nail changes Vasc:All pulses intact, no bruits present Neuro: Normal deep tendon reflexes, alert & oriented, sensation over feet normal to light touch Psych: judgment and insight, mood and affect normal         Assessment & Plan:  #1 diabetes, good control. No evidence of microscopic disease. Ophthalmologic exam to  #2 blood pressure, level control uncertain. Monitoring necessary  #3 dyslipidemia; LDL is at goal. Rising triglycerides suggest dietary component. Options discussed  #4 current smoker with 2-3 times increased  cardiovascular risk in the context of his coronary disease  Plan: See orders and recommendations

## 2011-01-13 NOTE — Patient Instructions (Signed)
Blood Pressure Goal  Ideally is an AVERAGE < 135/85. This AVERAGE should be calculated from @ least 5-7 BP readings taken @ different times of day on different days of week. You should not respond to isolated BP readings , but rather the AVERAGE for that week Eat a low-fat diet with lots of fruits and vegetables, up to 7-9 servings per day. Avoid obesity; your goal is waist measurement < 40 inches.Consume less than 40 grams of sugar per day from foods & drinks with High Fructose Corn Sugar as #1,2,3 or # 4 on label. Follow the low carb nutrition program in [employer] as closely as possible to prevent Diabetes progression & complications. White carbohydrates (potatoes, rice, bread, and pasta) have a high spike of sugar and a high load of sugar. For example a  baked potato has a cup of sugar and a  french fry  2 teaspoons of sugar. Yams, wild  rice, whole grained bread &  wheat pasta have been much lower spike and load of  sugar. Portions should be the size of a deck of cards or your palm.  Consider  [facility]'s smoking cessation program @ www.[place].com or 367-793-4118.   Please think about quitting smoking. Review the risks we discussed. Please call 1-800-QUIT-NOW 617-598-3876) for free smoking cessation counseling. Please  schedule fasting Labs in 6 mos : Lipids, AST,ALT, urine  micro albumin, A1c.  Please bring these instructions to that Lab appt.  Annual podiatry and retinal assessments are indicated because the diabetes.

## 2011-07-14 ENCOUNTER — Other Ambulatory Visit: Payer: BC Managed Care – PPO

## 2011-07-21 ENCOUNTER — Ambulatory Visit: Payer: BC Managed Care – PPO | Admitting: Internal Medicine

## 2011-08-09 ENCOUNTER — Other Ambulatory Visit: Payer: Self-pay | Admitting: Internal Medicine

## 2011-08-11 NOTE — Telephone Encounter (Signed)
A1C 250.00 

## 2011-08-28 ENCOUNTER — Telehealth: Payer: Self-pay | Admitting: Internal Medicine

## 2011-08-28 NOTE — Telephone Encounter (Signed)
Refill: Omeprazole dr 20mg  capsule. Take 1 capsule by mouth daily. Qty 90. Last fill 05-30-11

## 2011-08-28 NOTE — Telephone Encounter (Signed)
Refill: Simvastatin 40mg  tablet. Take 1 tablet by mouth daily at bedtime. Qty 90. Last fill 05-30-11  Metoprolol  succ er 50mg  tab. Take 1 tablet by mouth every day. Qty 90. Last fill 05-30-11

## 2011-08-29 MED ORDER — METOPROLOL SUCCINATE ER 50 MG PO TB24: 50.0000 mg | ORAL_TABLET | Freq: Every day | ORAL | Status: DC

## 2011-08-29 MED ORDER — SIMVASTATIN 40 MG PO TABS: 40.0000 mg | ORAL_TABLET | Freq: Every day | ORAL | Status: DC

## 2011-08-29 MED ORDER — OMEPRAZOLE 20 MG PO CPDR: 20.0000 mg | DELAYED_RELEASE_CAPSULE | Freq: Every day | ORAL | Status: DC

## 2011-08-29 NOTE — Telephone Encounter (Signed)
RXs sent.

## 2011-10-23 ENCOUNTER — Ambulatory Visit (INDEPENDENT_AMBULATORY_CARE_PROVIDER_SITE_OTHER): Payer: BC Managed Care – PPO | Admitting: Family Medicine

## 2011-10-23 ENCOUNTER — Encounter: Payer: Self-pay | Admitting: Family Medicine

## 2011-10-23 ENCOUNTER — Telehealth: Payer: Self-pay | Admitting: Internal Medicine

## 2011-10-23 ENCOUNTER — Ambulatory Visit (INDEPENDENT_AMBULATORY_CARE_PROVIDER_SITE_OTHER)
Admission: RE | Admit: 2011-10-23 | Discharge: 2011-10-23 | Disposition: A | Discharge status: Home / Self Care | Payer: BC Managed Care – PPO | Source: Ambulatory Visit | Attending: Family Medicine | Admitting: Family Medicine

## 2011-10-23 VITALS — BP 124/74 | HR 79 | Temp 98.3°F | Ht 70.0 in | Wt 252.4 lb

## 2011-10-23 DIAGNOSIS — R0781 Pleurodynia: Secondary | ICD-10-CM

## 2011-10-23 DIAGNOSIS — R079 Chest pain, unspecified: Secondary | ICD-10-CM

## 2011-10-23 IMAGING — CR DG RIBS 2V*R*
3 series · 3 of 3 positions shown · non-contrast
Comparison: Chest radiograph [DATE]

CLINICAL DATA: Pain in mid axillary line after fall.  Evaluate for
rib fracture.

RIGHT RIBS - 2 VIEW

[view not recorded (1 of 3)]
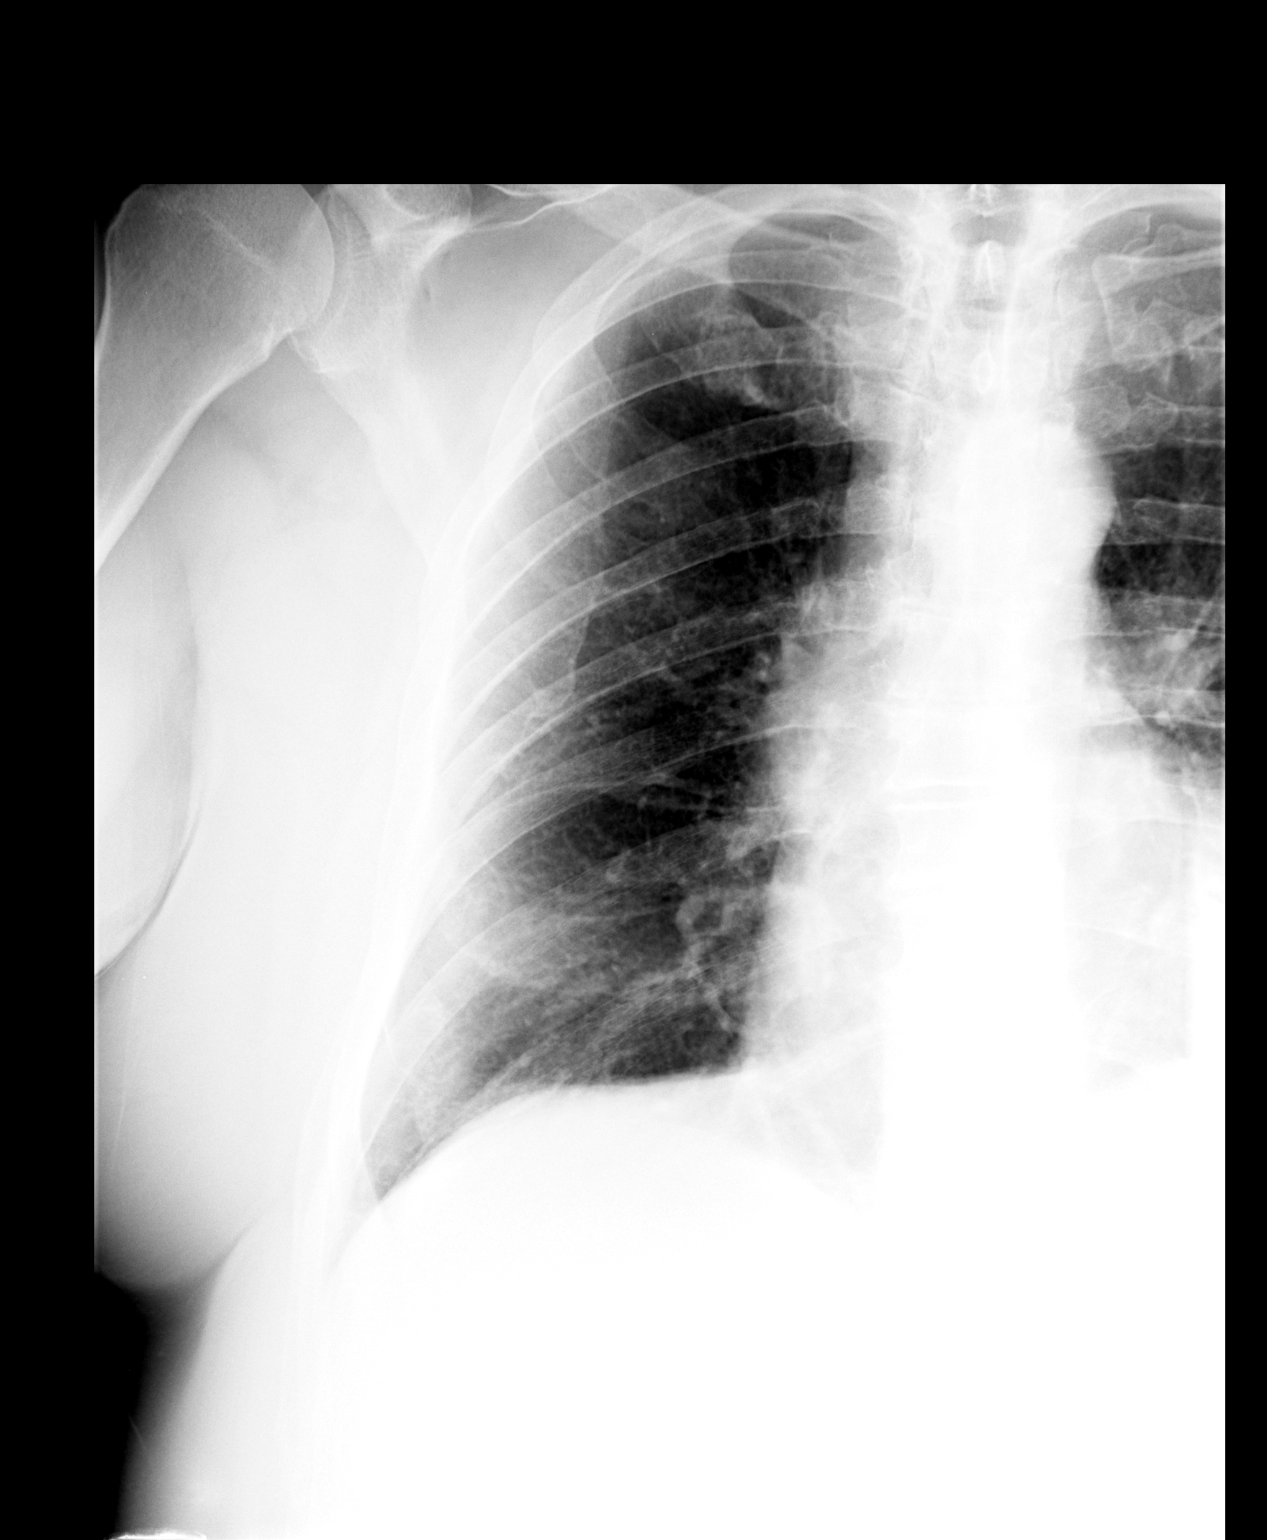

[view not recorded (2 of 3)]
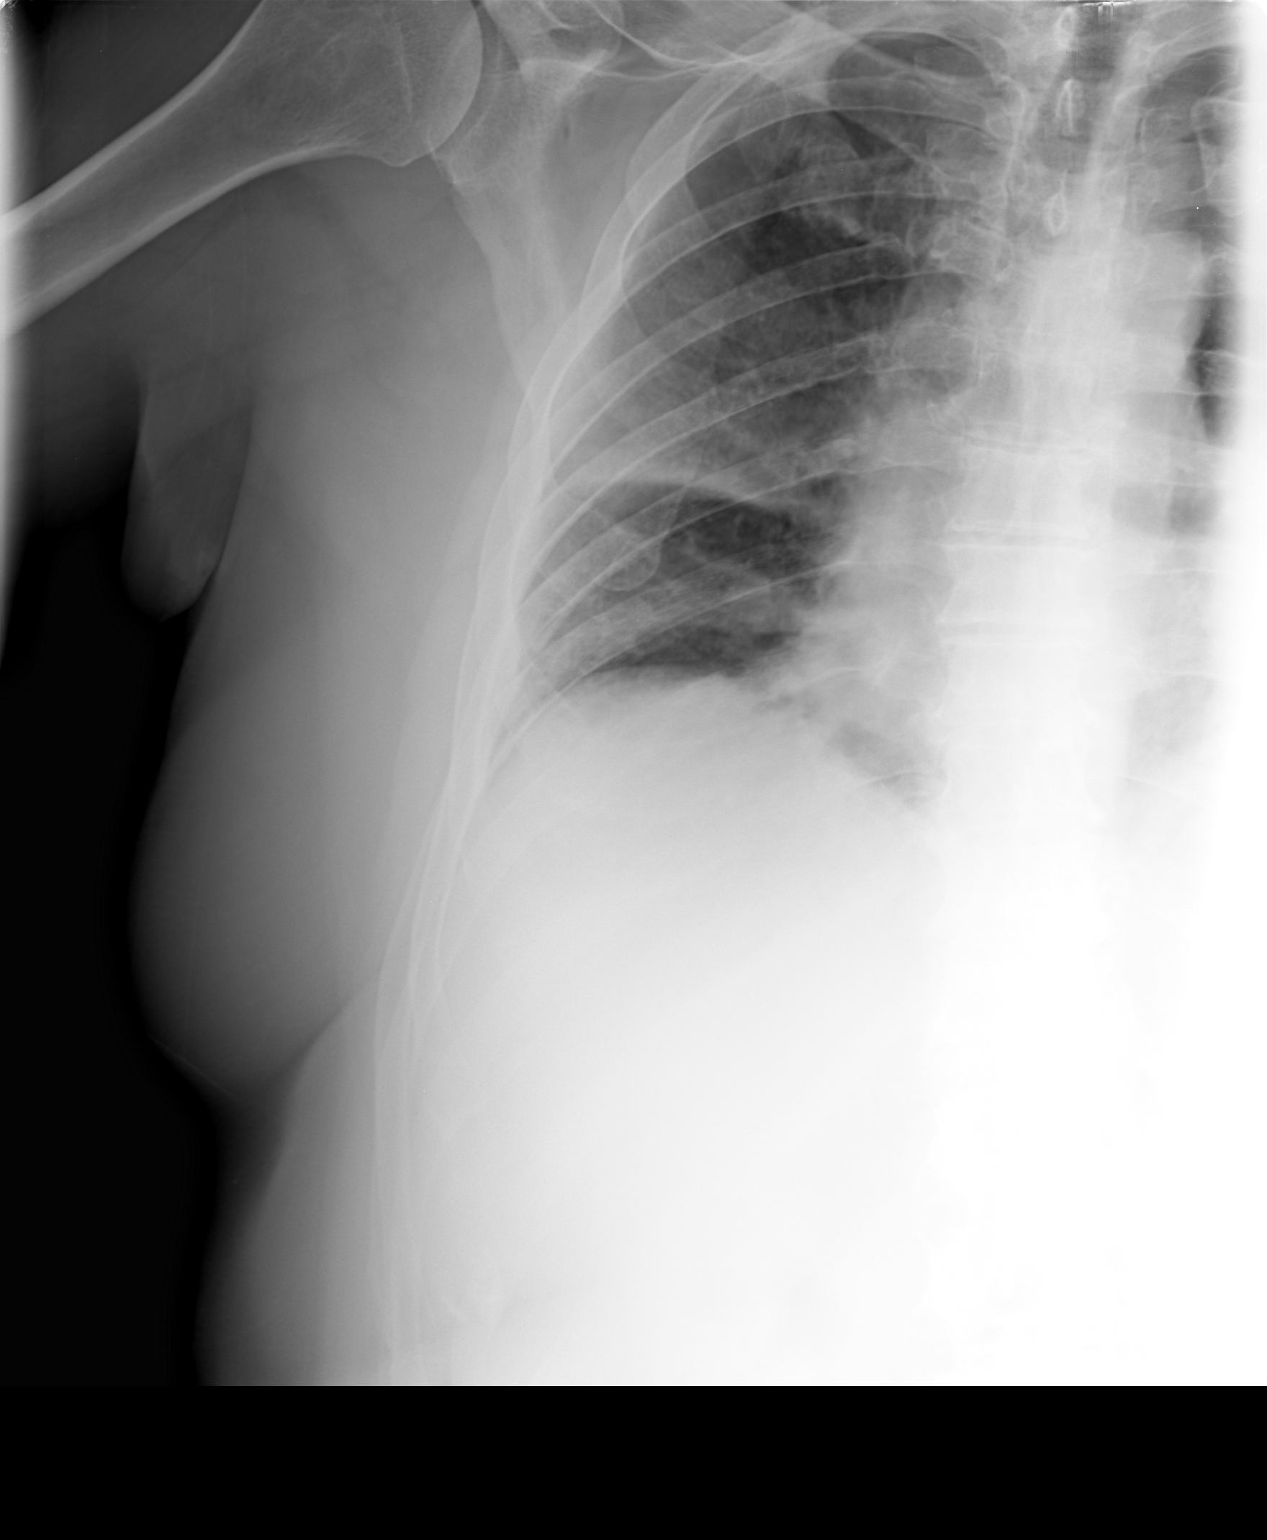

[view not recorded (3 of 3)]
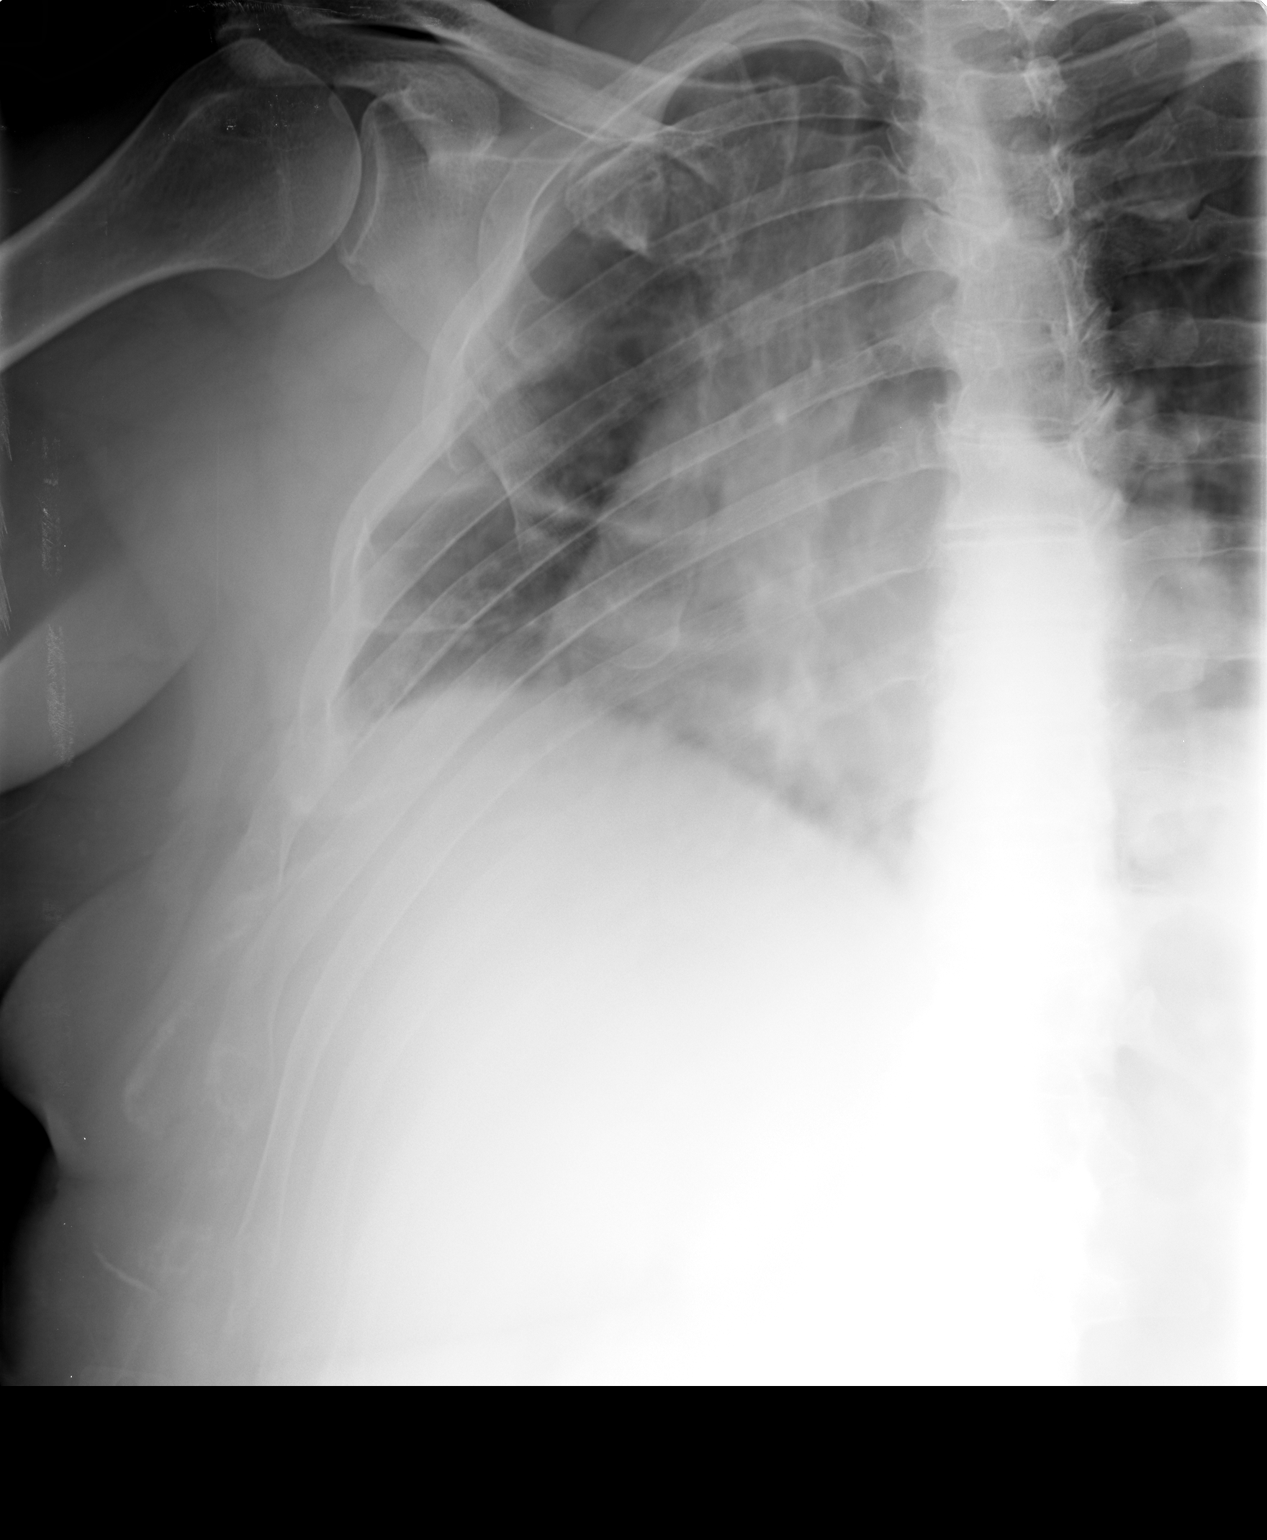

[3 of 3 positions shown; findings below may reference images not displayed]

FINDINGS: The right lung is normally expanded.  No evidence of
pneumothorax.  Mild peribronchial thickening is noted, without
evidence of airspace disease or pleural effusion.

No displaced right rib fracture is identified.
IMPRESSION: No right rib fracture visualized.  Please note that nondisplaced
rib fractures can be radiographically occult.

## 2011-10-23 MED ORDER — HYDROCODONE-ACETAMINOPHEN 5-500 MG PO TABS: 1.0000 | ORAL_TABLET | Freq: Three times a day (TID) | ORAL | Status: AC | PRN

## 2011-10-23 NOTE — Progress Notes (Signed)
  Subjective:    Patient ID: Brandon Rubio, male    DOB: 10/23/1950, 61 y.o.   MRN: [id number]  HPI R sided CP- was pushing riding mower out of a ditch on Monday, slipped and fell against the back of it.  Painful to lift R arm, very painful after sleeping on it all night.  Uncomfortable to take a deep breath or cough.  No visible bruising.  Had left over Vicodin, percocet w/ good relief.     Review of Systems For ROS see HPI     Objective:   Physical Exam  Vitals reviewed. Constitutional: He appears well-developed and well-nourished. No distress.  Cardiovascular: Normal rate, regular rhythm and normal heart sounds.   Pulmonary/Chest: Effort normal and breath sounds normal. No respiratory distress. He has no wheezes. He has no rales. He exhibits tenderness (over R side in mid-axillary line, no bony deformity or step-off).          Assessment & Plan:

## 2011-10-23 NOTE — Patient Instructions (Addendum)
Go to [address] and get your xray We'll call you with the results Continue the Vicodin as needed for pain Ice or heat- whichever feels better Continue to move to avoid stiffness Call with any questions or concerns Hang in there!!!

## 2011-10-23 NOTE — Telephone Encounter (Signed)
Caller: [name]/Patient; Patient Name: Brandon Rubio; PCP: Marga Melnick; Best Callback Phone Number: [phone].  Calling re chest contusion. Larey Seat while pushing riding lawnmower and hit R side of chest beneath auxilla on handles of lawnmower. Onset: 10/20/11.  Afebrile. No bruising or swelling. Notes discomfort when takes deep breath or raises R arm.  Fasting blood sugar not tested; was 120 on 10/20/11. Pain worse after sleeping.  Advised to see MD within 4 hours for blunt trauma to chest from low speed mechasim per Chest Injury Guideline. No appointments with Dr Alwyn Ren.  Scheduled for (only) appointment 1415 10/23/11 with Dr Beverely Low.

## 2011-11-02 NOTE — Assessment & Plan Note (Signed)
New.  Suspect bruise but get xrays to r/o fx.  Start pain meds prn.  Reviewed supportive care and red flags that should prompt return.  Pt expressed understanding and is in agreement w/ plan.

## 2011-12-02 ENCOUNTER — Other Ambulatory Visit: Payer: Self-pay | Admitting: Internal Medicine

## 2012-01-26 ENCOUNTER — Telehealth: Payer: Self-pay | Admitting: Internal Medicine

## 2012-01-26 DIAGNOSIS — T887XXA Unspecified adverse effect of drug or medicament, initial encounter: Secondary | ICD-10-CM

## 2012-01-26 DIAGNOSIS — E785 Hyperlipidemia, unspecified: Secondary | ICD-10-CM

## 2012-01-26 DIAGNOSIS — E119 Type 2 diabetes mellitus without complications: Secondary | ICD-10-CM

## 2012-01-26 NOTE — Telephone Encounter (Signed)
Called pt scheduled a CPE for 2.18.14 at 10:30am, but pt wants to still come earlier due to insurance requesting he have his blood work done since it has been so long since he had any. Also not pt stated he was not able to come this year for his regular CPE due to his work schedule keeping him out of town. Pt stated he is willing to pay the  Additional co-pays as he fears he will lose his insurance if he does not have the labs drawn and he knows he will be in town on the dates specified

## 2012-01-26 NOTE — Telephone Encounter (Signed)
Lab orders placed.  

## 2012-01-26 NOTE — Telephone Encounter (Signed)
This patient needs to be scheduled for a CPX

## 2012-01-26 NOTE — Telephone Encounter (Signed)
pt called scheduled labs work for 12.23.13 & f/u wt/hopp 1.2.14, can you put in orders please  Tks

## 2012-02-19 ENCOUNTER — Other Ambulatory Visit: Payer: Self-pay | Admitting: Internal Medicine

## 2012-03-01 ENCOUNTER — Other Ambulatory Visit (INDEPENDENT_AMBULATORY_CARE_PROVIDER_SITE_OTHER): Payer: BC Managed Care – PPO

## 2012-03-01 DIAGNOSIS — E119 Type 2 diabetes mellitus without complications: Secondary | ICD-10-CM

## 2012-03-01 DIAGNOSIS — E785 Hyperlipidemia, unspecified: Secondary | ICD-10-CM

## 2012-03-01 DIAGNOSIS — T887XXA Unspecified adverse effect of drug or medicament, initial encounter: Secondary | ICD-10-CM

## 2012-03-01 LAB — LIPID PANEL
Total CHOL/HDL Ratio: 4
VLDL: 54.2 mg/dL — ABNORMAL HIGH (ref 0.0–40.0)

## 2012-03-01 LAB — MICROALBUMIN / CREATININE URINE RATIO
Creatinine,U: 221.6 mg/dL
Microalb Creat Ratio: 0.5 mg/g (ref 0.0–30.0)

## 2012-03-01 LAB — HEPATIC FUNCTION PANEL
AST: 23 U/L (ref 0–37)
Alkaline Phosphatase: 59 U/L (ref 39–117)
Bilirubin, Direct: 0 mg/dL (ref 0.0–0.3)

## 2012-03-11 ENCOUNTER — Ambulatory Visit: Payer: BC Managed Care – PPO | Admitting: Internal Medicine

## 2012-03-12 ENCOUNTER — Encounter: Payer: Self-pay | Admitting: Internal Medicine

## 2012-03-12 ENCOUNTER — Ambulatory Visit (INDEPENDENT_AMBULATORY_CARE_PROVIDER_SITE_OTHER): Payer: BC Managed Care – PPO | Admitting: Internal Medicine

## 2012-03-12 VITALS — BP 110/76 | HR 90 | Wt 247.4 lb

## 2012-03-12 DIAGNOSIS — F17200 Nicotine dependence, unspecified, uncomplicated: Secondary | ICD-10-CM

## 2012-03-12 DIAGNOSIS — I10 Essential (primary) hypertension: Secondary | ICD-10-CM

## 2012-03-12 DIAGNOSIS — E785 Hyperlipidemia, unspecified: Secondary | ICD-10-CM

## 2012-03-12 NOTE — Assessment & Plan Note (Signed)
Nutritional & exercise focus with repeat labs 4 months

## 2012-03-12 NOTE — Assessment & Plan Note (Signed)
No change ; BMET 4 mos

## 2012-03-12 NOTE — Assessment & Plan Note (Signed)
Major risk is TG elevation; nutrition reviewed

## 2012-03-12 NOTE — Progress Notes (Signed)
Subjective:    Patient ID: Brandon Rubio, male    DOB: [birth year], 62 y.o.   MRN: [id number]  HPI The patient is here for followup of diabetes, hyperlipidemia, and hypertension.  The most recent A1c 03/01/12  was 7.7 , which correlates to an average sugar of 175 , and long-term risk of 54 % . Fasting blood sugar ranges  or average not checked X 5 months as meter broken   .  Highest two-hour postprandial glucose also not checked . No hypoglycemia Last ophthalmologic examination 12 mos  revealed no retinopathy. No active podiatry assessment on record. Diet is high carb as working on [employer] . Exercise as walking 20 -30 min / day  .  The most recent lipids 12/23   reveal LDL 68.7 , HDL 33.5  , and triglycerides 271  . There is medical compliance with the statin.  Blood pressure range  or average is not monitored  . There is medical compliance with antihypertensive medications         Review of Systems Constitutional: No fever, chills, significant weight change, fatigue,  or night sweats Eyes: Some  blurred vision. No double vision or loss of vision Cardiovascular: no chest pain, palpitations, racing, irregular rhythm,syncope,nausea,sweating, claudication, or edema  Respiratory: No exertinal dyspnea, paroxysmal nocturnal dyspnea Musculoskeletal: No myalgias or muscle cramping , weakness, or cyanosis Dermatologic: No change in color or temperature of skin; no non healing lesions Neurologic: No  limb weakness,  numbness or tingling Endocrine: No change in hair/skin/ nails, excessive thirst, excessive hunger,or  excessive urination     Objective:   Physical Exam Gen.: well-nourished in appearance. Alert, appropriate and cooperative throughout exam. Eyes: No corneal or conjunctival inflammation noted. No icterus Neck: No deformities, masses, or tenderness noted.  Thyroid normal. Lungs: Normal respiratory effort; chest expands symmetrically. Lungs are clear to auscultation  without rales, wheezes, or increased work of breathing. Decreased BS Heart: Normal rate and rhythm. Normal S1 and S2. No gallop, click, or rub. S4 w/o murmur. Abdomen: Bowel sounds normal; abdomen soft and nontender. No masses, organomegaly or hernias noted.                                                                              Musculoskeletal/extremities:  No clubbing, cyanosis, edema, or deformity noted.  Nail health  Good.Pes planus Vascular: Carotid, radial artery, dorsalis pedis and  posterior tibial pulses are full and equal. No bruits present. Neurologic: Alert and oriented x3. Deep tendon reflexes symmetrical and normal.   Light touch normal over feet.         Skin: Intact without suspicious lesions or rashes.  Psych: Mood and affect are normal. Normally interactive  Assessment & Plan:

## 2012-03-12 NOTE — Patient Instructions (Addendum)
Blood Pressure Goal  Ideally is an AVERAGE < 135/85. This AVERAGE should be calculated from @ least 5-7 BP readings taken @ different times of day on different days of week. You should not respond to isolated BP readings , but rather the AVERAGE for that week Blood Pressure Goal  Ideally is an AVERAGE < 135/85. This AVERAGE should be calculated from @ least 5-7 BP readings taken @ different times of day on different days of week. You should not respond to isolated BP readings , but rather the AVERAGE for that week  Please think about quitting smoking. Review the risks we discussed. Please call 1-800-QUIT-NOW [phone])[phone]) for free smoking cessation counseling. Cardiovascular exercise, this can be as simple a program as walking, is recommended 30-45 minutes 3-4 times per week. If you're not exercising you should take 6-8 weeks to build up to this level.     Please  schedule fasting Labs in 4 mos  : BMET,Lipids,  TSH. PLEASE BRING THESE INSTRUCTIONS TO FOLLOW UP  LAB APPOINTMENT.This will guarantee correct labs are drawn, eliminating need for repeat blood sampling ( needle sticks ! ). Diagnoses /Codes: 250.02,401.9, 995.20. Cancel 2/18 appt.   If you activate My Chart; the results can be released to you as soon as they populate from the lab. If you choose not to use this program; the labs have to be reviewed, copied & mailed   causing a delay in getting the results to you.

## 2012-03-12 NOTE — Assessment & Plan Note (Signed)
Risks & options discussed

## 2012-03-29 ENCOUNTER — Other Ambulatory Visit: Payer: Self-pay | Admitting: Internal Medicine

## 2012-04-27 ENCOUNTER — Encounter: Payer: BC Managed Care – PPO | Admitting: Internal Medicine

## 2012-06-14 ENCOUNTER — Other Ambulatory Visit: Payer: Self-pay | Admitting: Internal Medicine

## 2012-07-09 ENCOUNTER — Other Ambulatory Visit: Payer: BC Managed Care – PPO

## 2012-07-14 ENCOUNTER — Ambulatory Visit: Payer: BC Managed Care – PPO | Admitting: Internal Medicine

## 2012-08-04 ENCOUNTER — Telehealth: Payer: Self-pay | Admitting: Internal Medicine

## 2012-08-04 NOTE — Telephone Encounter (Signed)
Patient Information:  Caller Name: Coye  Phone: [phone]  Patient: Ladarian, Bonczek  Gender: Male  DOB: [birth year]  Age: 62 Years  PCP: Marga Melnick  Office Follow Up:  Does the office need to follow up with this patient?: No  Instructions For The Office: N/A   Symptoms  Reason For Call & Symptoms: Pt is calling about congestion/body aches and pains. Onset yesterday. No fever today. Pt was hot yesterday but didn't measure it. Pt is requesting a z-pac  Reviewed Health History In EMR: Yes  Reviewed Medications In EMR: Yes  Reviewed Allergies In EMR: Yes  Reviewed Surgeries / Procedures: Yes  Date of Onset of Symptoms: 08/03/2012  Guideline(s) Used:  Colds  Disposition Per Guideline:   Home Care  Reason For Disposition Reached:   Colds with no complications  Advice Given:  Reassurance  It sounds like an uncomplicated cold that we can treat at home.  Colds are very common and may make you feel uncomfortable.  Colds are caused by viruses, and no medicine or "shot" will cure an uncomplicated cold.  Colds are usually not serious.  Here is some care advice that should help.  For a Runny Nose With Profuse Discharge:   Nasal mucus and discharge helps to wash viruses and bacteria out of the nose and sinuses.  Blowing the nose is all that is needed.  Treatment for Associated Symptoms of Colds:  For muscle aches, headaches, or moderate fever (more than 101 F or 38.9 C): Take acetaminophen every 4 hours.  Sore throat: Try throat lozenges, hard candy, or warm chicken broth.  Cough: Use cough drops.  Hydrate: Drink adequate liquids.  Call Back If:  You become worse  Patient Will Follow Care Advice:  YES

## 2012-08-05 NOTE — Telephone Encounter (Signed)
Spoke with patient, patient states he feels better today than he did yesterday and is following at home care advice given by RN. Patient states he will call if he feels the need for an appointment

## 2012-08-24 ENCOUNTER — Ambulatory Visit (INDEPENDENT_AMBULATORY_CARE_PROVIDER_SITE_OTHER): Payer: BC Managed Care – PPO | Admitting: Family Medicine

## 2012-08-24 ENCOUNTER — Encounter: Payer: Self-pay | Admitting: Family Medicine

## 2012-08-24 VITALS — BP 118/80 | HR 80 | Temp 98.3°F | Wt 242.8 lb

## 2012-08-24 DIAGNOSIS — R5383 Other fatigue: Secondary | ICD-10-CM

## 2012-08-24 DIAGNOSIS — R5381 Other malaise: Secondary | ICD-10-CM | POA: Insufficient documentation

## 2012-08-24 LAB — HEMOGLOBIN A1C: Hgb A1c MFr Bld: 9.4 % — ABNORMAL HIGH (ref 4.6–6.5)

## 2012-08-24 LAB — CBC WITH DIFFERENTIAL/PLATELET
Basophils Absolute: 0.1 10*3/uL (ref 0.0–0.1)
Basophils Relative: 0.7 % (ref 0.0–3.0)
Eosinophils Relative: 2 % (ref 0.0–5.0)
Hemoglobin: 14.5 g/dL (ref 13.0–17.0)
Lymphocytes Relative: 28.3 % (ref 12.0–46.0)
Monocytes Relative: 5.8 % (ref 3.0–12.0)
Neutro Abs: 5.5 10*3/uL (ref 1.4–7.7)
RBC: 4.69 Mil/uL (ref 4.22–5.81)
RDW: 12.8 % (ref 11.5–14.6)
WBC: 8.7 10*3/uL (ref 4.5–10.5)

## 2012-08-24 NOTE — Progress Notes (Signed)
  Subjective:    Patient ID: Brandon Rubio, male    DOB: Feb 05, 1951, 62 y.o.   MRN: [id number]  HPI Pt reports that 'a couple of weeks ago' felt 'like i was coming down w/ the flu'.  Thought he was going to 'ride it out' but 'it's not getting any better'.  + fatigue, sweats, sore throat, mild dizziness- w/ position changes.  On metoprolol.  Sweats are occuring at night.  Now napping regularly and this is abnormal for pt.  + tobacco use.  Brother died of throat cancer.  Pt w/ diabetes, 'i haven't been checking my sugars at all for 6-8 months'.  CBG this AM 220.  No cough, no N/V/D.  No fevers.   Review of Systems For ROS see HPI     Objective:   Physical Exam  Vitals reviewed. Constitutional: He is oriented to person, place, and time. He appears well-developed and well-nourished. No distress.  HENT:  Head: Normocephalic and atraumatic.  Eyes: Conjunctivae and EOM are normal. Pupils are equal, round, and reactive to light.  Neck: Normal range of motion. Neck supple. No thyromegaly present.  Cardiovascular: Normal rate, regular rhythm, normal heart sounds and intact distal pulses.   No murmur heard. Pulmonary/Chest: Effort normal and breath sounds normal. No respiratory distress.  Abdominal: Soft. Bowel sounds are normal. He exhibits no distension.  Musculoskeletal: He exhibits no edema.  Lymphadenopathy:    He has no cervical adenopathy.  Neurological: He is alert and oriented to person, place, and time. No cranial nerve deficit.  Skin: Skin is warm and dry.  Psychiatric: He has a normal mood and affect. His behavior is normal.          Assessment & Plan:

## 2012-08-24 NOTE — Patient Instructions (Addendum)
Schedule an appt w/ Dr Alwyn Ren in 2-4 weeks We'll notify you of your lab results and make any changes if needed Please try and limit your sugars and make sure you are taking your medicine Increase your water intake Change positions slowly Call with any questions or concerns Hang in there!

## 2012-08-24 NOTE — Assessment & Plan Note (Signed)
New.  No obvious cause.  Given pt's tobacco use and presence of night sweats- malignancy is certainly in the differential but pt is very clear that he does not have a cough.  Discussed that if sugars are poorly controlled, this will also cause fatigue.  Check labs to r/o anemia, thyroid abnormality, electrolyte abnormality.  Next steps will be based on lab results.  Pt to f/u w/ PCP in short order to assess for improvement and to get any imaging studies that may be indicated if sxs persist.  Pt expressed understanding and is in agreement w/ plan.

## 2012-08-24 NOTE — Assessment & Plan Note (Signed)
New to provider, ongoing for pt.  Has not followed up w/ PCP as instructed.  Will get labs today as his sxs of fatigue and night sweats may all be related to poorly controlled sugars.  Pt to f/u w/ PCP.  Pt expressed understanding and is in agreement w/ plan.

## 2012-08-25 LAB — BASIC METABOLIC PANEL
Calcium: 9.1 mg/dL (ref 8.4–10.5)
GFR: 87.93 mL/min (ref >60.00)
Sodium: 136 mEq/L (ref 135–145)

## 2012-08-26 ENCOUNTER — Telehealth: Payer: Self-pay | Admitting: *Deleted

## 2012-08-26 MED ORDER — SITAGLIPTIN PHOS-METFORMIN HCL 50-1000 MG PO TABS: 1.0000 | ORAL_TABLET | Freq: Two times a day (BID) | ORAL | Status: DC

## 2012-08-26 MED ORDER — GLUCOSE BLOOD VI STRP: ORAL_STRIP | Status: DC

## 2012-08-26 NOTE — Addendum Note (Signed)
Addended by: Verdie Shire on: 08/26/2012 09:07 AM   Modules accepted: Orders

## 2012-08-26 NOTE — Telephone Encounter (Signed)
Rx sent 

## 2012-08-26 NOTE — Telephone Encounter (Signed)
Message copied by Verdene Rio on Thu Aug 26, 2012  5:44 PM ------      Message from: Sheliah Hatch      Created: Thu Aug 26, 2012  9:24 AM       A1C is not well controlled at all!  It has jumped nearly 2 points in 5 months.  Needs to stop the Metformin and switch to Janumet 50/1000 BID and f/u w/ Hopp ------

## 2012-08-27 NOTE — Telephone Encounter (Signed)
Patient wants to inform us that he found some testing strips left over to last him until his appointment.

## 2012-08-27 NOTE — Telephone Encounter (Signed)
Noted spoke with Pt and advised that per pharmacy due to change in coverage with insurance they will only cover onetouch ultra or contour glucose monitor. Pt states that he will continue to use current monitor since he now has strips and will f/u with Hopp at [facility] about changing monitor when he see him.

## 2012-08-31 ENCOUNTER — Ambulatory Visit: Payer: BC Managed Care – PPO | Admitting: Family Medicine

## 2012-09-09 ENCOUNTER — Telehealth: Payer: Self-pay | Admitting: *Deleted

## 2012-09-09 NOTE — Telephone Encounter (Signed)
Prior autho for freestyle lite test strips has been initiated.

## 2012-09-09 NOTE — Telephone Encounter (Signed)
Form completed faxed

## 2012-09-22 ENCOUNTER — Encounter: Payer: Self-pay | Admitting: Internal Medicine

## 2012-09-22 ENCOUNTER — Ambulatory Visit (INDEPENDENT_AMBULATORY_CARE_PROVIDER_SITE_OTHER): Payer: BC Managed Care – PPO | Admitting: Internal Medicine

## 2012-09-22 VITALS — BP 124/70 | HR 82 | Temp 98.5°F | Resp 13 | Wt 240.0 lb

## 2012-09-22 DIAGNOSIS — M542 Cervicalgia: Secondary | ICD-10-CM

## 2012-09-22 DIAGNOSIS — F17200 Nicotine dependence, unspecified, uncomplicated: Secondary | ICD-10-CM

## 2012-09-22 DIAGNOSIS — IMO0001 Reserved for inherently not codable concepts without codable children: Secondary | ICD-10-CM

## 2012-09-22 DIAGNOSIS — R42 Dizziness and giddiness: Secondary | ICD-10-CM

## 2012-09-22 NOTE — Patient Instructions (Addendum)
I recommend an  ENT consultation to determine cause of dizziness & neck pain &  optimal therapy; please inform me if you have a physician preference. Please think about quitting smoking. Review the risks we discussed. Please call 1-800-QUIT-NOW ([phone]) for free smoking cessation counseling.  See Dr Elmer Picker for Ophth update. Check glucose once daily if possible Fasting or morning glucose recommended M, W, F, & Sun if possible. Goal= 100-150 Glucose 2 hours after breakfast Tues, after lunch Thurs & 2 hrs after eve meal Sat if possible. Goal = < 180 Eat a low-fat diet with lots of fruits and vegetables, up to 7-9 servings per day. Avoid obesity; your goal is waist measurement < 40 inches.Consume less than 40 grams of sugar per day from foods & drinks with High Fructose Corn Sugar as #1,2,3 or # 4 on label. Follow the low carb nutrition program in [employer] as closely as possible to prevent Diabetes progression & complications. White carbohydrates (potatoes, rice, bread, and pasta) have a high spike of sugar and a high load of sugar. For example a  baked potato has a cup of sugar and a  french fry  2 teaspoons of sugar. Yams, wild  rice, whole grained bread &  wheat pasta have been much lower spike and load of  sugar. Portions should be the size of a deck of cards or your palm. Please  schedule fasting Labs in mid Sept after nutrition changes: Lipids,  A1c, urine microalbumin. PLEASE BRING THESE INSTRUCTIONS TO FOLLOW UP  LAB APPOINTMENT.This will guarantee correct labs are drawn, eliminating need for repeat blood sampling ( needle sticks ! ). Diagnoses /Codes: 250.02

## 2012-09-22 NOTE — Progress Notes (Signed)
Subjective:    Patient ID: Brandon Rubio, male    DOB: Nov 14, 1950, 62 y.o.   MRN: [id number]  HPI  He is here for followup of fatigue for which he was evaluated 08/24/12. That note and associated labs were reviewed. The only significant abnormality was an A1c of 9.4% indicating extremely poor diabetic control.  He states that he is "100% better" but continues to have blurred vision and dizziness. Activity such as waist flexion will aggravate the dizziness.  No associated hearing loss or tinnitus. seen 6/17 he had had some fever and sweats and thought that he had a flu syndrome. Those symptoms have resolved. He does not describe significant sputum production and has had no hemoptysis.   Updated diabetes status assesment: Fasting or morning glucose  average  101-163. He also takes glucose measurements approximate 6 PM prior to taking his second dose of Janumet and eating. These range from 103-238 Glucose 2 hours after any meal is not monitored. 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 No specific nutrition/diet followed Medication compliance is good. No medication adverse effects noted. Eye exam overdue. Foot care not current  A1c of 9.4% correlates with average sugar of 223 with long-term %  Risk of 88 %.         Review of Systems  No excess thirst ;  excess hunger ; or excess urination reported                              Some lightheadedness with standing reported. No BPV symptoms No chest pain ; palpitations ; claudication described .                                                                                                                             No non healing skin  ulcers or sores of extremities noted. Numbness  R great toe w/o other  tingling or burning in feet described  No significant change in weight . No double or loss of vision reported  .      There is no history of severe snoring or reports of apnea.  He's had intermittent bilateral neck pain for 10-15 years. This is been evaluated by ENT on several occasions.     Objective:   Physical Exam Gen.:  well-nourished in appearance. Alert, appropriate and cooperative throughout exam.  Head: Normocephalic without obvious abnormalities Eyes: No corneal or conjunctival inflammation noted.  Extraocular motion intact. No nystagmus. Vision grossly decreased , OS worse than OD Ears: External  ear exam reveals no significant lesions or deformities. Canals clear .TMs normal. Hearing is grossly normal bilaterally.Tuning fork exam normal Nose: External nasal exam reveals no deformity or inflammation. Nasal mucosa are pink and moist. No lesions or exudates noted. Septum to L Mouth: Oral mucosa and oropharynx reveal no lesions or exudates. Teeth in good repair. Neck: No deformities, masses, or tenderness noted. ROM &  Thyroid normal. Lungs: Normal respiratory effort; chest expands symmetrically. Lungs are clear to auscultation without rales, wheezes, or increased work of breathing. Decreased BS Heart: Normal rate and rhythm. Normal S1 and S2. No gallop, click, or rub. No murmur. Abdomen: Bowel sounds normal; abdomen soft and nontender. No masses, organomegaly or hernias noted.                                 Musculoskeletal/extremities: No deformity or scoliosis noted of  the thoracic or lumbar spine.  No clubbing, cyanosis, edema, or significant extremity  deformity noted. Range of motion normal .Tone & strength  Normal. Joints normal . Nail health good. Able to lie down & sit up w/o help.  Vascular: Carotid, radial artery, dorsalis pedis and  posterior tibial pulses are full and equal. No bruits  present. Neurologic: Alert and oriented x3. Romberg  & finger to nose negative . Light touch normal over feet. Gait normal   .        Skin: Intact without suspicious lesions or rashes. Lymph: No cervical, axillary lymphadenopathy present. Psych: Mood and affect are normal. Normally interactive                                                                                        Assessment & Plan:  #1 residual dizziness #2 neck pain bilaterally #3 DM uncontrolled #4 blurred vision probably due to #3 See recommendations

## 2012-11-17 ENCOUNTER — Other Ambulatory Visit: Payer: Self-pay | Admitting: Family Medicine

## 2012-11-17 NOTE — Telephone Encounter (Signed)
Please advise. SW, CMA 

## 2012-11-22 ENCOUNTER — Other Ambulatory Visit (INDEPENDENT_AMBULATORY_CARE_PROVIDER_SITE_OTHER): Payer: BC Managed Care – PPO

## 2012-11-22 DIAGNOSIS — E785 Hyperlipidemia, unspecified: Secondary | ICD-10-CM

## 2012-11-22 DIAGNOSIS — IMO0001 Reserved for inherently not codable concepts without codable children: Secondary | ICD-10-CM

## 2012-11-22 DIAGNOSIS — I10 Essential (primary) hypertension: Secondary | ICD-10-CM

## 2012-11-22 LAB — LIPID PANEL
HDL: 29.7 mg/dL — ABNORMAL LOW (ref >39.00)
Total CHOL/HDL Ratio: 4
Triglycerides: 158 mg/dL — ABNORMAL HIGH (ref 0.0–149.0)
VLDL: 31.6 mg/dL (ref 0.0–40.0)

## 2012-11-22 LAB — MICROALBUMIN / CREATININE URINE RATIO: Microalb Creat Ratio: 0.8 mg/g (ref 0.0–30.0)

## 2012-11-26 ENCOUNTER — Ambulatory Visit (INDEPENDENT_AMBULATORY_CARE_PROVIDER_SITE_OTHER): Payer: BC Managed Care – PPO | Admitting: Internal Medicine

## 2012-11-26 ENCOUNTER — Encounter: Payer: Self-pay | Admitting: Internal Medicine

## 2012-11-26 VITALS — BP 136/79 | HR 77 | Temp 98.8°F | Wt 237.0 lb

## 2012-11-26 DIAGNOSIS — I10 Essential (primary) hypertension: Secondary | ICD-10-CM

## 2012-11-26 DIAGNOSIS — IMO0001 Reserved for inherently not codable concepts without codable children: Secondary | ICD-10-CM

## 2012-11-26 DIAGNOSIS — E785 Hyperlipidemia, unspecified: Secondary | ICD-10-CM

## 2012-11-26 DIAGNOSIS — M542 Cervicalgia: Secondary | ICD-10-CM

## 2012-11-26 MED ORDER — SIMVASTATIN 40 MG PO TABS: ORAL_TABLET | ORAL | Status: DC

## 2012-11-26 NOTE — Assessment & Plan Note (Signed)
His diabetic risk has decreased over 50%; A1c is now at goal. No change in medications or nutrition recommended. Recheck A1c in 4 months.  He will share results with his ophthalmologist

## 2012-11-26 NOTE — Patient Instructions (Addendum)
Minimal Blood Pressure Goal= AVERAGE < 140/90;  Ideal is an AVERAGE < 135/85. This AVERAGE should be calculated from @ least 5-7 BP readings taken @ different times of day on different days of week. You should not respond to isolated BP readings , but rather the AVERAGE for that week .Please bring your  blood pressure cuff to office visits to verify that it is reliable.It  can also be checked against the blood pressure device at the pharmacy. Finger or wrist cuffs are not dependable; an arm cuff is. Interventions to raise HDL or GOOD cholesterol include: exercising 30-45 minutes 3-4 X per week; including salmon & tuna in the diet;  & supplementing with Omega 3 fatty acids (Flax Seed or Fish oil )  1-2 grams per day.The B vitamin Niacin also raises HDL but has a vasodilating effect which may cause flushing.To date extensive trials have NOT proven benefit of attempting to raise HDL with Niacin. Repeat A1c in 4 months. Decrease simvastatin 40 mg one half pill at bedtime. Please share these results with all MDs seen.

## 2012-11-26 NOTE — Progress Notes (Signed)
Subjective:    Patient ID: Brandon Rubio, male    DOB: 1950/06/28, 62 y.o.   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 HDL remains low at 29.7. LDL is excellent at 50 on 40 mg simvastatin.   No definitive FH premature CAD / CVA    Past medical history/family history/social history were all reviewed and updated.    He continues to have dull, fairly persistent pain in the lateral neck on both sides. Drinking coffee in the morning does improve this. This is actually been an issue for 2 decades; but he feels it's getting worse. It was previously evaluated by an ENT specialist. He denies hoarseness or dysphagia. He has no fever, chills, sweats, weight loss.       Review of Systems No significant headaches No chest pain, palpitations , claudications, PNDyspnea    No significant exertional dyspnea No lightheadedness or syncope   No edema No polyuria/phagia/dipsia No limb numbness & tingling or weakness No non healing skin lesions      No  double or loss of vision; some blurred vision No abd pain, bowel changes No significant myalgias      Objective:   Physical Exam Gen.:  well-nourished in appearance. Alert, appropriate and cooperative throughout exam. Appears younger than stated age  Head: Normocephalic without obvious abnormalities;  no alopecia   Eyes: No corneal or conjunctival inflammation noted.  Nose: External nasal exam reveals no deformity or inflammation. Nasal mucosa are pink and moist. No lesions or exudates noted.   Mouth: Oral mucosa and oropharynx reveal no lesions or exudates. Teeth in good repair. Neck: No deformities, masses, or tenderness noted. Range of motion & Thyroid normal. Lungs: Normal respiratory effort; chest expands symmetrically. Lungs are clear to auscultation without rales, wheezes, or increased work of breathing.Decreased BS Heart: Normal rate and rhythm. Normal S1; accentuated S2. No gallop, click, or rub. S4 with slurring at  LSB;no murmur. Abdomen: Bowel sounds normal; abdomen soft and nontender. No masses, organomegaly or hernias noted.                                  Musculoskeletal/extremities:  No clubbing, cyanosis, edema, or significant extremity  deformity noted. Tone & strength  normal.Joints normal . Nail health good. Able to lie down & sit up w/o help.  Vascular: Carotid, radial artery, dorsalis pedis and  posterior tibial pulses are full and equal. No bruits present. Neurologic: Alert and oriented x3. Deep tendon reflexes symmetrical and normal. Light touch normal over feet.         Skin: Intact without suspicious lesions or rashes. Lymph: No cervical, axillary lymphadenopathy present. Psych: Mood and affect are normal. Normally interactive  Assessment & Plan:  See Current Assessment & Plan in Problem List under specific Diagnosis

## 2012-11-26 NOTE — Assessment & Plan Note (Signed)
Blood pressure control adequate; goals discussed

## 2012-11-26 NOTE — Assessment & Plan Note (Signed)
Risk discussed 

## 2012-11-26 NOTE — Assessment & Plan Note (Signed)
Consult Dr. Annalee Genta

## 2012-11-26 NOTE — Assessment & Plan Note (Signed)
Nutritional changes have resulted in dramatic improvement in his lipids. Interventions to raise HDL discussed. Simvastatin will be decreased to 40 mg one half pill at night.

## 2012-11-30 ENCOUNTER — Encounter: Payer: Self-pay | Admitting: *Deleted

## 2012-12-05 ENCOUNTER — Encounter: Payer: Self-pay | Admitting: Internal Medicine

## 2012-12-06 ENCOUNTER — Telehealth: Payer: Self-pay | Admitting: *Deleted

## 2012-12-06 NOTE — Telephone Encounter (Signed)
Patient called and stated that he seen Dr. Annalee Genta for his throat issues and that he doubled his dose of omeprazole (PRILOSEC) 20 MG capsule. Patient states that he has been doubling up and will run out of medication tomorrow. He would like to know if we can send him in a prescription for this med to be taken BID. Please advise.

## 2012-12-06 NOTE — Telephone Encounter (Signed)
OK #60 , R X 2 based on ENT direct laryngoscopy findings

## 2012-12-07 ENCOUNTER — Other Ambulatory Visit: Payer: Self-pay | Admitting: *Deleted

## 2012-12-07 DIAGNOSIS — K219 Gastro-esophageal reflux disease without esophagitis: Secondary | ICD-10-CM

## 2012-12-07 MED ORDER — OMEPRAZOLE 20 MG PO CPDR: 20.0000 mg | DELAYED_RELEASE_CAPSULE | Freq: Two times a day (BID) | ORAL | Status: DC

## 2012-12-07 NOTE — Telephone Encounter (Signed)
Refill for Omeprazole #60 2 refills sent to [facility].

## 2012-12-07 NOTE — Telephone Encounter (Signed)
Refill sent to pharmacy.   

## 2013-02-24 ENCOUNTER — Telehealth: Payer: Self-pay | Admitting: *Deleted

## 2013-02-24 ENCOUNTER — Other Ambulatory Visit: Payer: Self-pay | Admitting: *Deleted

## 2013-02-24 MED ORDER — SITAGLIPTIN PHOS-METFORMIN HCL 50-1000 MG PO TABS: ORAL_TABLET | ORAL | Status: DC

## 2013-02-24 NOTE — Telephone Encounter (Signed)
Brandon Rubio from [facility] pharmacy called to request a refill for  JANUMET 50-1000 MG per tablet. Stanton Kidney states that they sent three request over for it.

## 2013-02-24 NOTE — Telephone Encounter (Signed)
Refill for Janumet sent to pharmacy.

## 2013-03-02 ENCOUNTER — Other Ambulatory Visit: Payer: Self-pay | Admitting: *Deleted

## 2013-03-02 DIAGNOSIS — K219 Gastro-esophageal reflux disease without esophagitis: Secondary | ICD-10-CM

## 2013-03-02 MED ORDER — OMEPRAZOLE 20 MG PO CPDR: 20.0000 mg | DELAYED_RELEASE_CAPSULE | Freq: Two times a day (BID) | ORAL | Status: DC

## 2013-03-28 ENCOUNTER — Other Ambulatory Visit (INDEPENDENT_AMBULATORY_CARE_PROVIDER_SITE_OTHER): Payer: BC Managed Care – PPO

## 2013-03-28 DIAGNOSIS — E1165 Type 2 diabetes mellitus with hyperglycemia: Principal | ICD-10-CM

## 2013-03-28 DIAGNOSIS — IMO0001 Reserved for inherently not codable concepts without codable children: Secondary | ICD-10-CM

## 2013-03-28 LAB — HEMOGLOBIN A1C: Hgb A1c MFr Bld: 6.6 % — ABNORMAL HIGH (ref 4.6–6.5)

## 2013-03-28 NOTE — Addendum Note (Signed)
Addended by: Modena Morrow D on: 03/28/2013 02:00 PM   Modules accepted: Orders

## 2013-03-29 ENCOUNTER — Telehealth: Payer: Self-pay | Admitting: *Deleted

## 2013-03-29 LAB — MICROALBUMIN / CREATININE URINE RATIO
Creatinine,U: 94.5 mg/dL
Microalb Creat Ratio: 0.1 mg/g (ref 0.0–30.0)
Microalb, Ur: 0.1 mg/dL (ref 0.0–1.9)

## 2013-03-29 NOTE — Telephone Encounter (Signed)
Message copied by Chilton Greathouse on Tue Mar 29, 2013  9:19 AM ------      Message from: Hendricks Limes      Created: Mon Mar 28, 2013  5:52 PM       A1c assesses average 24 hour  glucose over prior 6-12 weeks. A1c GOALS:      No Diabetes risk if < 6.1%       "Pre Diabetes" :6.2-6.4 %      Good diabetic control: 6.5-7 %. Your value is 6.6% ! A+ !      Fair diabetic control: 7-8 %      Poor diabetic control: greater than 8 % ( except with additional factors such as  advanced age; significant coronary or neurologic disease,etc).       Check the A1c  every 4 months as it is  6.5% or higher.      Goals for home glucose monitoring are : fasting  or morning glucose goal of  100-150. Two hours after any meal , goal = < 180, preferably < 160.      Report any low blood glucoses immediately.      Please bring your diary of glucose & blood pressure readings & all actual pill bottles to all appts.                     ------

## 2013-03-29 NOTE — Addendum Note (Signed)
Addended by: Modena Morrow D on: 03/29/2013 09:15 AM   Modules accepted: Orders

## 2013-03-29 NOTE — Addendum Note (Signed)
Addended by: Modena Morrow D on: 03/29/2013 09:41 AM   Modules accepted: Orders

## 2013-03-29 NOTE — Telephone Encounter (Signed)
Spoke with patient and advised of A1c results. Patient states he  "stopped taking the Janumet 30 days ago and started back the metformin due to the cost of Janumet.". I advised the patient to discuss this with Dr. Linna Darner during his upcoming OV so that they were on the same page. Patient stated he would share this with Dr. Linna Darner during his appointment.

## 2013-03-29 NOTE — Addendum Note (Signed)
Addended by: Modena Morrow D on: 03/29/2013 09:16 AM   Modules accepted: Orders

## 2013-04-04 ENCOUNTER — Ambulatory Visit (INDEPENDENT_AMBULATORY_CARE_PROVIDER_SITE_OTHER): Payer: BC Managed Care – PPO | Admitting: Internal Medicine

## 2013-04-04 ENCOUNTER — Encounter: Payer: Self-pay | Admitting: Internal Medicine

## 2013-04-04 VITALS — BP 128/77 | HR 68 | Temp 98.2°F | Wt 233.0 lb

## 2013-04-04 DIAGNOSIS — E1149 Type 2 diabetes mellitus with other diabetic neurological complication: Secondary | ICD-10-CM

## 2013-04-04 DIAGNOSIS — F17200 Nicotine dependence, unspecified, uncomplicated: Secondary | ICD-10-CM

## 2013-04-04 MED ORDER — METFORMIN HCL 1000 MG PO TABS
1000.0000 mg | ORAL_TABLET | Freq: Two times a day (BID) | ORAL | Status: DC
Start: 2013-04-04 — End: 2013-07-11

## 2013-04-04 NOTE — Assessment & Plan Note (Signed)
Risk discussed

## 2013-04-04 NOTE — Progress Notes (Signed)
Subjective:    Patient ID: Brandon Rubio, male    DOB: May 17, 1950, 63 y.o.   MRN: [id number]  HPI Diabetes status assessment: Fasting or morning glucose range average is not monitored. Highest glucose 2 hours after any meal is not monitored. No hypoglycemia reported                                                    No regular exercise. 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 Advised to walk 30 minutes 3 times/week. Avoids sweets. Does eat white bread, bagels. Advised that these are not good choices. Medication compliance:Janumet D/Ced 02/16/13 ; cost was to be $285/ month. Note:smoking 1& 1/2 ppd ,$7.50/ day No medication adverse effects noted. Eye exam current  01/2013 Dr Herbert Deaner; no retinopathy. Foot care not current. Advised about importance of checking feet daily.  A1c/ urine microalbumin monitor- 0.1 (03/29/2013)  (average sugar with long-term %  risk )      Review of Systems No excess thirst ;  excess hunger ; or excess urination reported                              No lightheadedness with standing reported No chest pain ; palpitations ; claudication described .                                                                                                                             No non healing skin  ulcers or sores of extremities noted. No tingling or burning in feet described. Numbness reported right great toe.                                                                                                                                        No significant change in weight of pound gain pound loss. No blurred,double, or loss of vision reported  .          Objective:   Physical Exam  Gen.:  well-nourished in appearance. Alert, appropriate and cooperative throughout exam.  Head: Normocephalic without obvious abnormalities Eyes: No corneal or conjunctival inflammation noted. Pupils equal round reactive to light and  accommodation. Extraocular motion  intact. Mouth: Oral mucosa and oropharynx reveal no lesions or exudates. Teeth in good repair. Neck: No deformities, masses, or tenderness noted. Thyroid :physiologic asymmetry. Lungs: Normal respiratory effort; chest expands symmetrically. Lungs are clear to auscultation without rales, wheezes, or increased work of breathing. Heart: Normal rate and rhythm. Normal S1 and S2. No gallop, click, or rub. No murmur. Abdomen: Bowel sounds normal; abdomen soft and nontender. No masses, organomegaly or hernias noted.                                 Musculoskeletal/extremities:  No clubbing, cyanosis, edema, or significant extremity  deformity noted. Range of motion normal .Tone & strength normal. Hand joints normal . Fingernail / toenail health good.  Vascular: Carotid, radial artery, dorsalis pedis and  posterior tibial pulses are  Equal.Slightly decreased DPP. No bruits present. Neurologic: Alert and oriented x3. Deep tendon reflexes symmetrical and normal.   Light touch normal over feet.       Skin: Intact without suspicious lesions or rashes. Lymph: No cervical, axillary lymphadenopathy present. Psych: Mood and affect are normal. Normally interactive                                                                                        Assessment & Plan:  See Current Assessment & Plan in Problem List under specific Diagnosis

## 2013-04-04 NOTE — Progress Notes (Signed)
Pre visit review using our clinic review tool, if applicable. No additional management support is needed unless otherwise documented below in the visit note. 

## 2013-04-04 NOTE — Patient Instructions (Signed)
Check glucose once daily if possible Fasting or morning glucose recommended M, W, F, & Sun if possible. Goal= 100-150 Glucose 2 hours after breakfast Tues, after lunch Thurs & 2 hrs after eve meal Sat if possible. Goal = < 180 Please think about quitting smoking. Review the risks we discussed. Please call 1-800-QUIT-NOW (347)124-9093) for free smoking cessation counseling. There are multiple options are to help you stop smoking. These include nicotine patches, nicotine gum, and the new "E cigarette".

## 2013-04-05 ENCOUNTER — Encounter: Payer: Self-pay | Admitting: *Deleted

## 2013-04-06 ENCOUNTER — Telehealth: Payer: Self-pay

## 2013-04-06 NOTE — Telephone Encounter (Signed)
Relevant patient education mailed to patient.  

## 2013-04-12 ENCOUNTER — Telehealth: Payer: Self-pay | Admitting: Internal Medicine

## 2013-04-12 NOTE — Telephone Encounter (Signed)
Relevant patient education mailed to patient.  

## 2013-06-02 ENCOUNTER — Other Ambulatory Visit (INDEPENDENT_AMBULATORY_CARE_PROVIDER_SITE_OTHER): Payer: Medicare Other

## 2013-06-02 DIAGNOSIS — E1149 Type 2 diabetes mellitus with other diabetic neurological complication: Secondary | ICD-10-CM | POA: Diagnosis not present

## 2013-06-02 LAB — BASIC METABOLIC PANEL
BUN: 14 mg/dL (ref 6–23)
CHLORIDE: 104 meq/L (ref 96–112)
CO2: 23 meq/L (ref 19–32)
CREATININE: 1 mg/dL (ref 0.4–1.5)
Calcium: 9.1 mg/dL (ref 8.4–10.5)
GFR: 84.53 mL/min (ref >60.00)
Glucose, Bld: 114 mg/dL — ABNORMAL HIGH (ref 70–99)
Potassium: 4 mEq/L (ref 3.5–5.1)
Sodium: 137 mEq/L (ref 135–145)

## 2013-06-02 LAB — HEMOGLOBIN A1C: Hgb A1c MFr Bld: 6.8 % — ABNORMAL HIGH (ref 4.6–6.5)

## 2013-06-05 ENCOUNTER — Other Ambulatory Visit: Payer: Self-pay | Admitting: Internal Medicine

## 2013-06-05 DIAGNOSIS — E1149 Type 2 diabetes mellitus with other diabetic neurological complication: Secondary | ICD-10-CM

## 2013-06-06 ENCOUNTER — Encounter: Payer: Self-pay | Admitting: *Deleted

## 2013-06-09 ENCOUNTER — Other Ambulatory Visit: Payer: Self-pay

## 2013-06-09 DIAGNOSIS — K219 Gastro-esophageal reflux disease without esophagitis: Secondary | ICD-10-CM

## 2013-06-09 MED ORDER — OMEPRAZOLE 20 MG PO CPDR: 20.0000 mg | DELAYED_RELEASE_CAPSULE | Freq: Two times a day (BID) | ORAL | Status: DC

## 2013-07-03 ENCOUNTER — Other Ambulatory Visit: Payer: Self-pay | Admitting: Internal Medicine

## 2013-07-11 ENCOUNTER — Other Ambulatory Visit: Payer: Self-pay

## 2013-07-11 DIAGNOSIS — E1149 Type 2 diabetes mellitus with other diabetic neurological complication: Secondary | ICD-10-CM

## 2013-07-11 MED ORDER — METFORMIN HCL 1000 MG PO TABS: 1000.0000 mg | ORAL_TABLET | Freq: Two times a day (BID) | ORAL | Status: DC

## 2013-10-13 ENCOUNTER — Other Ambulatory Visit: Payer: Self-pay

## 2013-10-13 DIAGNOSIS — E785 Hyperlipidemia, unspecified: Secondary | ICD-10-CM

## 2013-10-13 MED ORDER — SIMVASTATIN 40 MG PO TABS: ORAL_TABLET | ORAL | Status: DC

## 2013-12-25 ENCOUNTER — Other Ambulatory Visit: Payer: Self-pay | Admitting: Internal Medicine

## 2014-01-02 ENCOUNTER — Other Ambulatory Visit: Payer: Self-pay | Admitting: Internal Medicine

## 2014-01-03 DIAGNOSIS — Z23 Encounter for immunization: Secondary | ICD-10-CM | POA: Diagnosis not present

## 2014-01-18 ENCOUNTER — Other Ambulatory Visit: Payer: Self-pay

## 2014-01-18 MED ORDER — GLUCOSE BLOOD VI STRP: ORAL_STRIP | Status: DC

## 2014-02-17 ENCOUNTER — Encounter: Payer: Self-pay | Admitting: Internal Medicine

## 2014-02-17 ENCOUNTER — Ambulatory Visit (INDEPENDENT_AMBULATORY_CARE_PROVIDER_SITE_OTHER): Payer: Medicare Other | Admitting: Internal Medicine

## 2014-02-17 VITALS — BP 120/80 | HR 78 | Temp 98.0°F | Resp 14 | Ht 72.0 in | Wt 238.1 lb

## 2014-02-17 DIAGNOSIS — Z720 Tobacco use: Secondary | ICD-10-CM

## 2014-02-17 DIAGNOSIS — E1365 Other specified diabetes mellitus with hyperglycemia: Secondary | ICD-10-CM

## 2014-02-17 DIAGNOSIS — Z860100 Personal history of colon polyps, unspecified: Secondary | ICD-10-CM | POA: Insufficient documentation

## 2014-02-17 DIAGNOSIS — E785 Hyperlipidemia, unspecified: Secondary | ICD-10-CM

## 2014-02-17 DIAGNOSIS — E1342 Other specified diabetes mellitus with diabetic polyneuropathy: Secondary | ICD-10-CM

## 2014-02-17 DIAGNOSIS — F17200 Nicotine dependence, unspecified, uncomplicated: Secondary | ICD-10-CM

## 2014-02-17 DIAGNOSIS — IMO0002 Reserved for concepts with insufficient information to code with codable children: Secondary | ICD-10-CM

## 2014-02-17 DIAGNOSIS — I10 Essential (primary) hypertension: Secondary | ICD-10-CM

## 2014-02-17 DIAGNOSIS — E1340 Other specified diabetes mellitus with diabetic neuropathy, unspecified: Secondary | ICD-10-CM | POA: Diagnosis not present

## 2014-02-17 NOTE — Assessment & Plan Note (Signed)
CBC

## 2014-02-17 NOTE — Assessment & Plan Note (Addendum)
Lipids, LFTs, TSH  

## 2014-02-17 NOTE — Progress Notes (Signed)
Pre visit review using our clinic review tool, if applicable. No additional management support is needed unless otherwise documented below in the visit note. 

## 2014-02-17 NOTE — Assessment & Plan Note (Signed)
Blood pressure goals reviewed. BMET 

## 2014-02-17 NOTE — Assessment & Plan Note (Signed)
A1c , urine microalbumin, BMET 

## 2014-02-17 NOTE — Patient Instructions (Signed)
Your next office appointment will be determined based upon review of your pending labs in January 2016. Those instructions will be transmitted to you by mail. Goals for home glucose monitoring are : fasting  or morning glucose goal of  90-150. Two hours (from "first bite") after any meal , goal = < 180, preferably < 160.Any low blood glucoses should be reported immediately.

## 2014-02-17 NOTE — Progress Notes (Signed)
Subjective:    Patient ID: Brandon Rubio., male    DOB: 1951-01-23, 63 y.o.   MRN: [id number]  HPI He is here to assess active health issues & conditions. PMH, FH, & Social history verified & updated   He has not been on a particularly heart healthy or diabetic type dietary program as he has been working in [place] for the last 2 months . 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 He had no associated cardiopulmonary symptoms with that  He brings in recording his of his fasting sugars for the last 8 days. These range from a low of 113-high of 158.  He has been compliant with his medications without adverse effects.  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  His ophthalmologic exam is overdue; it is been scheduled for May 2016. He's not seen a podiatrist.  He did have 2 tubular adenomas @ colonoscopy in Nov 2011. He's due for follow-up in November 2016. He has no active GI symptoms at this time.      Review of Systems   Significant headaches, epistaxis, chest pain, palpitations, exertional dyspnea, claudication, paroxysmal nocturnal dyspnea, or edema absent. No GI symptoms , memory loss or myalgias  Unexplained weight loss, abdominal pain, significant dyspepsia, dysphagia, melena, rectal bleeding, or persistently small caliber stools are denied.  Polyuria, polyphagia, polydipsia absent. There is no blurred vision, double vision, or loss of vision.  Also denied are  burning of the extremities & nonhealing skin lesions.       Objective:   Physical Exam Gen.: Adequately nourished in appearance. As per CDC Guidelines ,Epic documents mild obesity as being present . Alert, appropriate and cooperative throughout exam. Head: Normocephalic without obvious abnormalities  Eyes: No corneal or conjunctival inflammation noted. Pupils equal round reactive to light and accommodation. Extraocular motion intact.  Ptosis bilaterally Ears: External  ear exam reveals no significant lesions or deformities. Canals clear .TMs normal.  Nose: External nasal exam reveals no deformity or inflammation. Nasal mucosa are pink and moist. No lesions or exudates noted. Septum to L Mouth: Oral mucosa and oropharynx reveal no lesions or exudates. Teeth in good repair. Neck: No deformities, masses, or tenderness noted. Range of motion & Thyroid normal. Lungs: Barrel chested.Decreased breath sounds.Normal respiratory effort; chest expands symmetrically. Lungs are clear to auscultation without rales, wheezes, or increased work of breathing. Heart: Normal rate and rhythm. Normal S1 and S2. No gallop, click, or rub. S4 w/o murmur. Abdomen: Protuberant.Bowel sounds normal; abdomen soft and nontender. No masses, organomegaly or hernias noted. Musculoskeletal/extremities: No deformity or scoliosis noted of  the thoracic or lumbar spine.  No clubbing, cyanosis, edema, or significant extremity  deformity noted.  Range of motion normal . Tone & strength normal. Hand joints normal.  Pes planus. Fingernail / toenail health good. Crepitus of knees  Able to lie down & sit up w/o help.  Negative SLR bilaterally Vascular: Carotid, radial artery, dorsalis pedis and  posterior tibial pulses are full and equal. No bruits present. Neurologic: Alert and oriented x3. Deep tendon reflexes symmetrical and normal. Decreased sensation R great toe. Gait normal       Skin: Intact without suspicious lesions or rashes. Lymph: No cervical, axillary lymphadenopathy present. Psych: Mood and affect are normal. Normally interactive  Assessment & Plan:  See Current Assessment & Plan in Problem List under specific Diagnosis

## 2014-02-19 NOTE — Assessment & Plan Note (Signed)
CV risks discussed; no interest in stopping @ this time

## 2014-02-20 ENCOUNTER — Telehealth: Payer: Self-pay | Admitting: Internal Medicine

## 2014-02-20 ENCOUNTER — Encounter: Payer: Self-pay | Admitting: Internal Medicine

## 2014-02-20 NOTE — Telephone Encounter (Signed)
emmi mailed  °

## 2014-06-22 ENCOUNTER — Other Ambulatory Visit: Payer: Self-pay | Admitting: Internal Medicine

## 2014-06-22 ENCOUNTER — Other Ambulatory Visit (INDEPENDENT_AMBULATORY_CARE_PROVIDER_SITE_OTHER): Payer: Medicare Other

## 2014-06-22 DIAGNOSIS — E785 Hyperlipidemia, unspecified: Secondary | ICD-10-CM

## 2014-06-22 DIAGNOSIS — Z86010 Personal history of colonic polyps: Secondary | ICD-10-CM | POA: Diagnosis not present

## 2014-06-22 DIAGNOSIS — E1365 Other specified diabetes mellitus with hyperglycemia: Secondary | ICD-10-CM

## 2014-06-22 DIAGNOSIS — E1340 Other specified diabetes mellitus with diabetic neuropathy, unspecified: Secondary | ICD-10-CM | POA: Diagnosis not present

## 2014-06-22 DIAGNOSIS — I10 Essential (primary) hypertension: Secondary | ICD-10-CM | POA: Diagnosis not present

## 2014-06-22 DIAGNOSIS — E1342 Other specified diabetes mellitus with diabetic polyneuropathy: Secondary | ICD-10-CM

## 2014-06-22 DIAGNOSIS — IMO0002 Reserved for concepts with insufficient information to code with codable children: Secondary | ICD-10-CM

## 2014-06-22 LAB — LIPID PANEL
Cholesterol: 137 mg/dL (ref 0–200)
HDL: 31.1 mg/dL — AB (ref >39.00)
NonHDL: 105.9
Total CHOL/HDL Ratio: 4
Triglycerides: 218 mg/dL — ABNORMAL HIGH (ref 0.0–149.0)
VLDL: 43.6 mg/dL — ABNORMAL HIGH (ref 0.0–40.0)

## 2014-06-22 LAB — MICROALBUMIN / CREATININE URINE RATIO
CREATININE, U: 222.7 mg/dL
MICROALB UR: 1.9 mg/dL (ref 0.0–1.9)
Microalb Creat Ratio: 0.9 mg/g (ref 0.0–30.0)

## 2014-06-22 LAB — CBC WITH DIFFERENTIAL/PLATELET
BASOS ABS: 0 10*3/uL (ref 0.0–0.1)
Basophils Relative: 0.4 % (ref 0.0–3.0)
EOS PCT: 1.5 % (ref 0.0–5.0)
Eosinophils Absolute: 0.1 10*3/uL (ref 0.0–0.7)
HCT: 43.6 % (ref 39.0–52.0)
Hemoglobin: 15 g/dL (ref 13.0–17.0)
LYMPHS PCT: 23.9 % (ref 12.0–46.0)
Lymphs Abs: 2.2 10*3/uL (ref 0.7–4.0)
MCHC: 34.4 g/dL (ref 30.0–36.0)
MCV: 88.8 fl (ref 78.0–100.0)
MONOS PCT: 5.7 % (ref 3.0–12.0)
Monocytes Absolute: 0.5 10*3/uL (ref 0.1–1.0)
Neutro Abs: 6.3 10*3/uL (ref 1.4–7.7)
Neutrophils Relative %: 68.5 % (ref 43.0–77.0)
PLATELETS: 198 10*3/uL (ref 150.0–400.0)
RBC: 4.91 Mil/uL (ref 4.22–5.81)
RDW: 12.7 % (ref 11.5–15.5)
WBC: 9.2 10*3/uL (ref 4.0–10.5)

## 2014-06-22 LAB — BASIC METABOLIC PANEL
BUN: 14 mg/dL (ref 6–23)
CALCIUM: 9.3 mg/dL (ref 8.4–10.5)
CO2: 25 mEq/L (ref 19–32)
Chloride: 105 mEq/L (ref 96–112)
Creatinine, Ser: 0.99 mg/dL (ref 0.40–1.50)
GFR: 80.34 mL/min (ref >60.00)
Glucose, Bld: 148 mg/dL — ABNORMAL HIGH (ref 70–99)
Potassium: 4.1 mEq/L (ref 3.5–5.1)
SODIUM: 138 meq/L (ref 135–145)

## 2014-06-22 LAB — HEPATIC FUNCTION PANEL
ALBUMIN: 4.2 g/dL (ref 3.5–5.2)
ALK PHOS: 54 U/L (ref 39–117)
ALT: 32 U/L (ref 0–53)
AST: 19 U/L (ref 0–37)
Bilirubin, Direct: 0.1 mg/dL (ref 0.0–0.3)
Total Bilirubin: 0.4 mg/dL (ref 0.2–1.2)
Total Protein: 7.1 g/dL (ref 6.0–8.3)

## 2014-06-22 LAB — HEMOGLOBIN A1C: Hgb A1c MFr Bld: 7.8 % — ABNORMAL HIGH (ref 4.6–6.5)

## 2014-06-22 LAB — LDL CHOLESTEROL, DIRECT: LDL DIRECT: 72 mg/dL

## 2014-06-22 LAB — TSH: TSH: 1.22 u[IU]/mL (ref 0.35–4.50)

## 2014-06-29 ENCOUNTER — Other Ambulatory Visit: Payer: Self-pay | Admitting: Internal Medicine

## 2014-06-29 ENCOUNTER — Telehealth: Payer: Self-pay | Admitting: Internal Medicine

## 2014-06-29 MED ORDER — OMEPRAZOLE 20 MG PO CPDR: 20.0000 mg | DELAYED_RELEASE_CAPSULE | Freq: Two times a day (BID) | ORAL | Status: DC

## 2014-06-29 NOTE — Telephone Encounter (Signed)
Patient needs refill for omeprazole (PRILOSEC) 20 MG capsule [[id number]. Pharmacy [place]

## 2014-06-29 NOTE — Telephone Encounter (Signed)
Refill sent to [facility].../[facility]mb 

## 2014-07-11 DIAGNOSIS — D225 Melanocytic nevi of trunk: Secondary | ICD-10-CM | POA: Diagnosis not present

## 2014-07-11 DIAGNOSIS — L821 Other seborrheic keratosis: Secondary | ICD-10-CM | POA: Diagnosis not present

## 2014-07-11 DIAGNOSIS — L570 Actinic keratosis: Secondary | ICD-10-CM | POA: Diagnosis not present

## 2014-07-11 DIAGNOSIS — D1801 Hemangioma of skin and subcutaneous tissue: Secondary | ICD-10-CM | POA: Diagnosis not present

## 2014-07-11 DIAGNOSIS — L820 Inflamed seborrheic keratosis: Secondary | ICD-10-CM | POA: Diagnosis not present

## 2014-07-11 DIAGNOSIS — Z85828 Personal history of other malignant neoplasm of skin: Secondary | ICD-10-CM | POA: Diagnosis not present

## 2014-07-11 DIAGNOSIS — L218 Other seborrheic dermatitis: Secondary | ICD-10-CM | POA: Diagnosis not present

## 2014-07-25 DIAGNOSIS — H2513 Age-related nuclear cataract, bilateral: Secondary | ICD-10-CM | POA: Diagnosis not present

## 2014-07-25 DIAGNOSIS — H40013 Open angle with borderline findings, low risk, bilateral: Secondary | ICD-10-CM | POA: Diagnosis not present

## 2014-07-25 DIAGNOSIS — E119 Type 2 diabetes mellitus without complications: Secondary | ICD-10-CM | POA: Diagnosis not present

## 2014-07-25 DIAGNOSIS — H25013 Cortical age-related cataract, bilateral: Secondary | ICD-10-CM | POA: Diagnosis not present

## 2014-08-02 ENCOUNTER — Encounter: Payer: Self-pay | Admitting: Gastroenterology

## 2014-09-19 ENCOUNTER — Other Ambulatory Visit: Payer: Self-pay | Admitting: Internal Medicine

## 2014-12-21 ENCOUNTER — Other Ambulatory Visit: Payer: Self-pay | Admitting: Internal Medicine

## 2014-12-25 ENCOUNTER — Other Ambulatory Visit: Payer: Self-pay | Admitting: Internal Medicine

## 2015-01-04 ENCOUNTER — Other Ambulatory Visit: Payer: Self-pay | Admitting: Internal Medicine

## 2015-01-12 ENCOUNTER — Encounter: Payer: Self-pay | Admitting: Gastroenterology

## 2015-02-08 ENCOUNTER — Encounter: Payer: Self-pay | Admitting: Gastroenterology

## 2015-03-19 ENCOUNTER — Telehealth: Payer: Self-pay | Admitting: Internal Medicine

## 2015-03-19 MED ORDER — METOPROLOL SUCCINATE ER 50 MG PO TB24: 50.0000 mg | ORAL_TABLET | Freq: Every day | ORAL | Status: DC

## 2015-03-19 MED ORDER — METFORMIN HCL 1000 MG PO TABS: 1000.0000 mg | ORAL_TABLET | Freq: Two times a day (BID) | ORAL | Status: DC

## 2015-03-19 MED ORDER — SIMVASTATIN 40 MG PO TABS: ORAL_TABLET | ORAL | Status: DC

## 2015-03-19 MED ORDER — OMEPRAZOLE 20 MG PO CPDR: 20.0000 mg | DELAYED_RELEASE_CAPSULE | Freq: Every day | ORAL | Status: DC

## 2015-03-19 NOTE — Telephone Encounter (Signed)
3 month supply sent in today.

## 2015-03-19 NOTE — Telephone Encounter (Signed)
Pt was a pt of [facility] and he has an appt with you on 3/3. A refill request was sent in for his Metformin and was denied stating he needs an appointment. He's hoping you will refill it until he gets in here on 3/3.  He's having a colonoscopy in February and he was told by Dr. Linna Darner he didn't want to see him till after the procedure.  I have left paperwork pt brought in with you.  Pharmacy is [facility] on [address] Please let me know your decision

## 2015-03-19 NOTE — Telephone Encounter (Signed)
Pt informed

## 2015-03-20 ENCOUNTER — Ambulatory Visit: Payer: Medicare Other | Admitting: Internal Medicine

## 2015-03-23 DIAGNOSIS — H40013 Open angle with borderline findings, low risk, bilateral: Secondary | ICD-10-CM | POA: Diagnosis not present

## 2015-04-03 ENCOUNTER — Ambulatory Visit (AMBULATORY_SURGERY_CENTER): Payer: Self-pay | Admitting: *Deleted

## 2015-04-03 VITALS — Ht 72.0 in | Wt 242.6 lb

## 2015-04-03 DIAGNOSIS — Z86010 Personal history of colonic polyps: Secondary | ICD-10-CM

## 2015-04-03 MED ORDER — NA SULFATE-K SULFATE-MG SULF 17.5-3.13-1.6 GM/177ML PO SOLN: ORAL | Status: DC

## 2015-04-03 NOTE — Progress Notes (Signed)
No allergies to eggs or soy. No problems with anesthesia.  Pt given Emmi instructions for colonoscopy  No oxygen use  No diet drug use  

## 2015-04-17 ENCOUNTER — Ambulatory Visit (AMBULATORY_SURGERY_CENTER): Payer: Medicare Other | Admitting: Gastroenterology

## 2015-04-17 ENCOUNTER — Encounter: Payer: Self-pay | Admitting: Gastroenterology

## 2015-04-17 VITALS — BP 110/70 | HR 69 | Temp 97.6°F | Resp 14 | Ht 72.0 in | Wt 242.0 lb

## 2015-04-17 DIAGNOSIS — I2510 Atherosclerotic heart disease of native coronary artery without angina pectoris: Secondary | ICD-10-CM | POA: Diagnosis not present

## 2015-04-17 DIAGNOSIS — D128 Benign neoplasm of rectum: Secondary | ICD-10-CM

## 2015-04-17 DIAGNOSIS — D129 Benign neoplasm of anus and anal canal: Secondary | ICD-10-CM

## 2015-04-17 DIAGNOSIS — Z86010 Personal history of colonic polyps: Secondary | ICD-10-CM | POA: Diagnosis not present

## 2015-04-17 DIAGNOSIS — E785 Hyperlipidemia, unspecified: Secondary | ICD-10-CM | POA: Diagnosis not present

## 2015-04-17 DIAGNOSIS — K621 Rectal polyp: Secondary | ICD-10-CM

## 2015-04-17 DIAGNOSIS — D123 Benign neoplasm of transverse colon: Secondary | ICD-10-CM | POA: Diagnosis not present

## 2015-04-17 DIAGNOSIS — E119 Type 2 diabetes mellitus without complications: Secondary | ICD-10-CM | POA: Diagnosis not present

## 2015-04-17 DIAGNOSIS — Z800 Family history of malignant neoplasm of digestive organs: Secondary | ICD-10-CM

## 2015-04-17 DIAGNOSIS — I10 Essential (primary) hypertension: Secondary | ICD-10-CM | POA: Diagnosis not present

## 2015-04-17 LAB — GLUCOSE, CAPILLARY
GLUCOSE-CAPILLARY: 194 mg/dL — AB (ref 65–99)
Glucose-Capillary: 159 mg/dL — ABNORMAL HIGH (ref 65–99)

## 2015-04-17 MED ORDER — SODIUM CHLORIDE 0.9 % IV SOLN: 500.0000 mL | INTRAVENOUS | Status: DC

## 2015-04-17 NOTE — Patient Instructions (Signed)
YOU HAD AN ENDOSCOPIC PROCEDURE TODAY AT [facility]:   Refer to the procedure report that was given to you for any specific questions about what was found during the examination.  If the procedure report does not answer your questions, please call your gastroenterologist to clarify.  If you requested that your care partner not be given the details of your procedure findings, then the procedure report has been included in a sealed envelope for you to review at your convenience later.  YOU SHOULD EXPECT: Some feelings of bloating in the abdomen. Passage of more gas than usual.  Walking can help get rid of the air that was put into your GI tract during the procedure and reduce the bloating. If you had a lower endoscopy (such as a colonoscopy or flexible sigmoidoscopy) you may notice spotting of blood in your stool or on the toilet paper. If you underwent a bowel prep for your procedure, you may not have a normal bowel movement for a few days.  Please Note:  You might notice some irritation and congestion in your nose or some drainage.  This is from the oxygen used during your procedure.  There is no need for concern and it should clear up in a day or so.  SYMPTOMS TO REPORT IMMEDIATELY:   Following lower endoscopy (colonoscopy or flexible sigmoidoscopy):  Excessive amounts of blood in the stool  Significant tenderness or worsening of abdominal pains  Swelling of the abdomen that is new, acute  Fever of 100F or higher  For urgent or emergent issues, a gastroenterologist can be reached at any hour by calling [phone].  DIET: Your first meal following the procedure should be a small meal and then it is ok to progress to your normal diet. Heavy or fried foods are harder to digest and may make you feel nauseous or bloated.  Likewise, meals heavy in dairy and vegetables can increase bloating.  Drink plenty of fluids but you should avoid alcoholic beverages for 24 hours.  ACTIVITY:   You should plan to take it easy for the rest of today and you should NOT DRIVE or use heavy machinery until tomorrow (because of the sedation medicines used during the test).    FOLLOW UP: Our staff will call the number listed on your records the next business day following your procedure to check on you and address any questions or concerns that you may have regarding the information given to you following your procedure. If we do not reach you, we will leave a message.  However, if you are feeling well and you are not experiencing any problems, there is no need to return our call.  We will assume that you have returned to your regular daily activities without incident.  If any biopsies were taken you will be contacted by phone or by letter within the next 1-3 weeks.  Please call us at [phone] if you have not heard about the biopsies in 3 weeks.    SIGNATURES/CONFIDENTIALITY: You and/or your care partner have signed paperwork which will be entered into your electronic medical record.  These signatures attest to the fact that that the information above on your After Visit Summary has been reviewed and is understood.  Full responsibility of the confidentiality of this discharge information lies with you and/or your care-partner.  Please read over handout about polyps  Please continue your normal medications

## 2015-04-17 NOTE — Op Note (Signed)
[facility]  [address] [place], [address]   COLONOSCOPY PROCEDURE REPORT  PATIENT: Brandon, Rubio  MR#: [id number] BIRTHDATE: Apr 15, 1950 , 56  yrs. old GENDER: male ENDOSCOPIST: Milus Banister, MD PROCEDURE DATE:  04/17/2015 PROCEDURE:   Colonoscopy, surveillance and Colonoscopy with snare polypectomy First Screening Colonoscopy - Avg.  risk and is 50 yrs.  old or older - No.  Prior Negative Screening - Now for repeat screening. N/A  History of Adenoma - Now for follow-up colonoscopy & has been > or = to 3 yrs.  Yes hx of adenoma.  Has been 3 or more years since last colonoscopy.  Polyps removed today? Yes ASA CLASS:   Class II INDICATIONS:Surveillance due to prior colonic neoplasia and Colonoscopy Dr.  Ardis Hughs 2011, 5 subCM polyps two of which were adenomas; father had colon cancer in his late 67s. MEDICATIONS: Monitored anesthesia care and Propofol 200 mg IV  DESCRIPTION OF PROCEDURE:   After the risks benefits and alternatives of the procedure were thoroughly explained, informed consent was obtained.  The digital rectal exam revealed no abnormalities of the rectum.   The [id number]  endoscope was introduced through the anus and advanced to the cecum, which was identified by both the appendix and ileocecal valve. No adverse events experienced.   The quality of the prep was excellent.  The instrument was then slowly withdrawn as the colon was fully examined. Estimated blood loss is zero unless otherwise noted in this procedure report.   COLON FINDINGS: Two sessile polyps were found, removed and sent to pathology.  These were 3-14m across, located in transverse and rectal segments, removed with cold snare.  The examination was otherwise normal.  Retroflexed views revealed no abnormalities. The time to cecum = 2.3 Withdrawal time = 7.0   The scope was withdrawn and the procedure completed. COMPLICATIONS: There were no immediate  complications.  ENDOSCOPIC IMPRESSION: Two sessile polyps were found, removed and sent to pathology. The examination was otherwise normal  RECOMMENDATIONS: Given your significant family history of colon cancer (father), you should have a repeat colonoscopy in 5 years even if the polyps removed today are NOT precancerous.  You will receive a letter within 1-2 weeks with the results of your biopsy as well as final recommendations.  Please call my office if you have not received a letter after 3 weeks.  eSigned:  DMilus Banister MD 04/17/2015 9:07 AM   cc: Dr. EPricilla Holm

## 2015-04-17 NOTE — Progress Notes (Signed)
Report to PACU, RN, vss, BBS= Clear.  

## 2015-04-17 NOTE — Progress Notes (Signed)
Called to room to assist during endoscopic procedure.  Patient ID and intended procedure confirmed with present staff. Received instructions for my participation in the procedure from the performing physician.  

## 2015-04-18 ENCOUNTER — Telehealth: Payer: Self-pay

## 2015-04-18 NOTE — Telephone Encounter (Signed)
  Follow up Call-  Call back number 04/17/2015  Post procedure Call Back phone  # [phone]  Permission to leave phone message Yes     Patient questions:  Do you have a fever, pain , or abdominal swelling? No. Pain Score  0 *  Have you tolerated food without any problems? Yes.    Have you been able to return to your normal activities? Yes.    Do you have any questions about your discharge instructions: Diet   No. Medications  Yes.   Follow up visit  No.  Do you have questions or concerns about your Care? No.  Actions: * If pain score is 4 or above: No action needed, pain <4.

## 2015-04-24 ENCOUNTER — Encounter: Payer: Self-pay | Admitting: Gastroenterology

## 2015-05-11 ENCOUNTER — Encounter: Payer: Self-pay | Admitting: Internal Medicine

## 2015-05-11 ENCOUNTER — Ambulatory Visit (INDEPENDENT_AMBULATORY_CARE_PROVIDER_SITE_OTHER): Payer: Medicare Other | Admitting: Internal Medicine

## 2015-05-11 ENCOUNTER — Other Ambulatory Visit (INDEPENDENT_AMBULATORY_CARE_PROVIDER_SITE_OTHER): Payer: Medicare Other

## 2015-05-11 VITALS — BP 132/86 | HR 81 | Temp 98.1°F | Resp 20

## 2015-05-11 DIAGNOSIS — E1342 Other specified diabetes mellitus with diabetic polyneuropathy: Secondary | ICD-10-CM | POA: Diagnosis not present

## 2015-05-11 DIAGNOSIS — I10 Essential (primary) hypertension: Secondary | ICD-10-CM

## 2015-05-11 DIAGNOSIS — F17200 Nicotine dependence, unspecified, uncomplicated: Secondary | ICD-10-CM | POA: Diagnosis not present

## 2015-05-11 DIAGNOSIS — E1365 Other specified diabetes mellitus with hyperglycemia: Secondary | ICD-10-CM

## 2015-05-11 DIAGNOSIS — E785 Hyperlipidemia, unspecified: Secondary | ICD-10-CM | POA: Diagnosis not present

## 2015-05-11 DIAGNOSIS — E1142 Type 2 diabetes mellitus with diabetic polyneuropathy: Secondary | ICD-10-CM

## 2015-05-11 DIAGNOSIS — Z23 Encounter for immunization: Secondary | ICD-10-CM

## 2015-05-11 DIAGNOSIS — IMO0002 Reserved for concepts with insufficient information to code with codable children: Secondary | ICD-10-CM

## 2015-05-11 LAB — LIPID PANEL
Cholesterol: 127 mg/dL (ref 0–200)
HDL: 29.1 mg/dL — ABNORMAL LOW (ref >39.00)
NonHDL: 98.27
Total CHOL/HDL Ratio: 4
Triglycerides: 233 mg/dL — ABNORMAL HIGH (ref 0.0–149.0)
VLDL: 46.6 mg/dL — ABNORMAL HIGH (ref 0.0–40.0)

## 2015-05-11 LAB — CBC
HCT: 42.6 % (ref 39.0–52.0)
Hemoglobin: 14.6 g/dL (ref 13.0–17.0)
MCHC: 34.3 g/dL (ref 30.0–36.0)
MCV: 87.3 fl (ref 78.0–100.0)
Platelets: 247 10*3/uL (ref 150.0–400.0)
RBC: 4.88 Mil/uL (ref 4.22–5.81)
RDW: 12.8 % (ref 11.5–15.5)
WBC: 10.5 10*3/uL (ref 4.0–10.5)

## 2015-05-11 LAB — COMPREHENSIVE METABOLIC PANEL
ALBUMIN: 4.4 g/dL (ref 3.5–5.2)
ALK PHOS: 58 U/L (ref 39–117)
ALT: 21 U/L (ref 0–53)
AST: 15 U/L (ref 0–37)
BILIRUBIN TOTAL: 0.4 mg/dL (ref 0.2–1.2)
BUN: 10 mg/dL (ref 6–23)
CO2: 25 mEq/L (ref 19–32)
Calcium: 9.4 mg/dL (ref 8.4–10.5)
Chloride: 102 mEq/L (ref 96–112)
Creatinine, Ser: 0.98 mg/dL (ref 0.40–1.50)
GFR: 81.07 mL/min (ref >60.00)
GLUCOSE: 183 mg/dL — AB (ref 70–99)
Potassium: 4.2 mEq/L (ref 3.5–5.1)
SODIUM: 137 meq/L (ref 135–145)
TOTAL PROTEIN: 7 g/dL (ref 6.0–8.3)

## 2015-05-11 LAB — LDL CHOLESTEROL, DIRECT: LDL DIRECT: 68 mg/dL

## 2015-05-11 LAB — HEPATITIS C ANTIBODY: HCV Ab: NEGATIVE

## 2015-05-11 LAB — HEMOGLOBIN A1C: HEMOGLOBIN A1C: 8.8 % — AB (ref 4.6–6.5)

## 2015-05-11 NOTE — Assessment & Plan Note (Signed)
BP at goal on metoprolol. Should be on ACE-I or ARB but does not want to change at this time.

## 2015-05-11 NOTE — Assessment & Plan Note (Signed)
On simvastatin, checking lipid panel and adjust as needed. No side effects.

## 2015-05-11 NOTE — Progress Notes (Signed)
Pre visit review using our clinic review tool, if applicable. No additional management support is needed unless otherwise documented below in the visit note. 

## 2015-05-11 NOTE — Patient Instructions (Signed)
We will check the labs today and call you back about the results.   Keep working on staying active by working.   Keep watching your feet for any problems.   Diabetes and Standards of Medical Care Diabetes is complicated. You may find that your diabetes team includes a dietitian, nurse, diabetes educator, eye doctor, and more. To help everyone know what is going on and to help you get the care you deserve, the following schedule of care was developed to help keep you on track. Below are the tests, exams, vaccines, medicines, education, and plans you will need. HbA1c test This test shows how well you have controlled your glucose over the past 2-3 months. It is used to see if your diabetes management plan needs to be adjusted.   It is performed at least 2 times a year if you are meeting treatment goals.  It is performed 4 times a year if therapy has changed or if you are not meeting treatment goals. Blood pressure test  This test is performed at every routine medical visit. The goal is less than 140/90 mm Hg for most people, but 130/80 mm Hg in some cases. Ask your health care provider about your goal. Dental exam  Follow up with the dentist regularly. Eye exam  If you are diagnosed with type 1 diabetes as a child, get an exam upon reaching the age of 3 years or older and having had diabetes for 3-5 years. Yearly eye exams are recommended after that initial eye exam.  If you are diagnosed with type 1 diabetes as an adult, get an exam within 5 years of diagnosis and then yearly.  If you are diagnosed with type 2 diabetes, get an exam as soon as possible after the diagnosis and then yearly. Foot care exam  Visual foot exams are performed at every routine medical visit. The exams check for cuts, injuries, or other problems with the feet.  You should have a complete foot exam performed every year. This exam includes an inspection of the structure and skin of your feet, a check of the pulses  in your feet, and a check of the sensation in your feet.  Type 1 diabetes: The first exam is performed 5 years after diagnosis.  Type 2 diabetes: The first exam is performed at the time of diagnosis.  Check your feet nightly for cuts, injuries, or other problems with your feet. Tell your health care provider if anything is not healing. Kidney function test (urine microalbumin)  This test is performed once a year.  Type 1 diabetes: The first test is performed 5 years after diagnosis.  Type 2 diabetes: The first test is performed at the time of diagnosis.  A serum creatinine and estimated glomerular filtration rate (eGFR) test is done once a year to assess the level of chronic kidney disease (CKD), if present. Lipid profile (cholesterol, HDL, LDL, triglycerides)  Performed every 5 years for most people.  The goal for LDL is less than 100 mg/dL. If you are at high risk, the goal is less than 70 mg/dL.  The goal for HDL is 40 mg/dL-50 mg/dL for men and 50 mg/dL-60 mg/dL for women. An HDL cholesterol of 60 mg/dL or higher gives some protection against heart disease.  The goal for triglycerides is less than 150 mg/dL. Immunizations  The flu (influenza) vaccine is recommended yearly for every person 51 months of age or older who has diabetes.  The pneumonia (pneumococcal) vaccine is recommended for every  person 57 years of age or older who has diabetes. Adults 70 years of age or older may receive the pneumonia vaccine as a series of two separate shots.  The hepatitis B vaccine is recommended for adults shortly after they have been diagnosed with diabetes.  The Tdap (tetanus, diphtheria, and pertussis) vaccine should be given:  According to normal childhood vaccination schedules, for children.  Every 10 years, for adults who have diabetes. Diabetes self-management education  Education is recommended at diagnosis and ongoing as needed. Treatment plan  Your treatment plan is reviewed  at every medical visit.   This information is not intended to replace advice given to you by your health care provider. Make sure you discuss any questions you have with your health care provider.   Document Released: 12/22/2008 Document Revised: 03/17/2014 Document Reviewed: 07/27/2012 Elsevier Interactive Patient Education [employer].

## 2015-05-11 NOTE — Assessment & Plan Note (Signed)
Some numbness in his feet, foot exam done today. Not on ACE-I or ARB and does not wish to add today. Checking HgA1c as last was >1 year ago and not well controlled. Is on metformin 1000 mg BID only. Gets eye exam and done in the last several months.

## 2015-05-11 NOTE — Assessment & Plan Note (Signed)
Counseled to quit and he does not want to try at this time. Several attempts in the past which were not successful.

## 2015-05-11 NOTE — Progress Notes (Signed)
   Subjective:    Patient ID: Brandon Rubio., male    DOB: 23-May-1950, 65 y.o.   MRN: [id number]  HPI The patient is a 65 YO man coming in for follow up of his medical problems including his diabetes (recently got eye exam, on metformin and no ACE-I or ARB, complicated by neuropathy), his blood pressure (well controlled with metoprolol, not complicated, no side effects), and his cholesterol (on simvastatin, no side effects). He is not having any new concerns. Still smoking and has tried to quit in the past without success.   Review of Systems  Constitutional: Negative for fever, activity change, appetite change, fatigue and unexpected weight change.  HENT: Negative.   Eyes: Negative.   Respiratory: Negative for cough, chest tightness, shortness of breath and wheezing.   Cardiovascular: Negative for chest pain, palpitations and leg swelling.  Gastrointestinal: Negative for nausea, abdominal pain, diarrhea, constipation and abdominal distention.  Musculoskeletal: Negative.   Skin: Negative.   Neurological: Positive for numbness. Negative for dizziness, syncope, weakness, light-headedness and headaches.  Psychiatric/Behavioral: Negative.       Objective:   Physical Exam  Constitutional: He is oriented to person, place, and time. He appears well-developed and well-nourished.  overweight  HENT:  Head: Normocephalic and atraumatic.  Eyes: EOM are normal.  Neck: Normal range of motion.  Cardiovascular: Normal rate and regular rhythm.   Pulmonary/Chest: Effort normal and breath sounds normal. No respiratory distress. He has no wheezes. He has no rales.  Abdominal: Soft. Bowel sounds are normal. He exhibits no distension. There is no tenderness. There is no rebound.  Neurological: He is alert and oriented to person, place, and time. Coordination normal.  Skin: Skin is warm and dry.  See foot exam  Psychiatric: He has a normal mood and affect.   Filed Vitals:   05/11/15 0802  BP:  132/86  Pulse: 81  Temp: 98.1 F (36.7 C)  TempSrc: Oral  Resp: 20  SpO2: 98%      Assessment & Plan:  Flu shot given at visit.

## 2015-05-14 ENCOUNTER — Other Ambulatory Visit: Payer: Self-pay | Admitting: Internal Medicine

## 2015-07-05 ENCOUNTER — Other Ambulatory Visit: Payer: Self-pay | Admitting: Internal Medicine

## 2015-12-24 ENCOUNTER — Other Ambulatory Visit: Payer: Self-pay | Admitting: Internal Medicine

## 2016-03-26 ENCOUNTER — Other Ambulatory Visit: Payer: Self-pay | Admitting: Internal Medicine

## 2016-04-05 ENCOUNTER — Other Ambulatory Visit: Payer: Self-pay | Admitting: Internal Medicine

## 2016-05-23 ENCOUNTER — Encounter: Payer: Self-pay | Admitting: Internal Medicine

## 2016-05-23 DIAGNOSIS — T1501XA Foreign body in cornea, right eye, initial encounter: Secondary | ICD-10-CM | POA: Diagnosis not present

## 2016-05-23 DIAGNOSIS — E119 Type 2 diabetes mellitus without complications: Secondary | ICD-10-CM | POA: Diagnosis not present

## 2016-05-23 DIAGNOSIS — H25013 Cortical age-related cataract, bilateral: Secondary | ICD-10-CM | POA: Diagnosis not present

## 2016-05-23 DIAGNOSIS — H40013 Open angle with borderline findings, low risk, bilateral: Secondary | ICD-10-CM | POA: Diagnosis not present

## 2016-05-23 LAB — HM DIABETES EYE EXAM

## 2016-05-23 NOTE — Progress Notes (Unsigned)
Results entered and sent to scan  

## 2016-07-04 ENCOUNTER — Other Ambulatory Visit: Payer: Self-pay | Admitting: Internal Medicine

## 2016-08-02 ENCOUNTER — Other Ambulatory Visit: Payer: Self-pay | Admitting: Internal Medicine

## 2016-08-11 ENCOUNTER — Other Ambulatory Visit (INDEPENDENT_AMBULATORY_CARE_PROVIDER_SITE_OTHER): Payer: Medicare Other

## 2016-08-11 ENCOUNTER — Ambulatory Visit (INDEPENDENT_AMBULATORY_CARE_PROVIDER_SITE_OTHER): Payer: Medicare Other | Admitting: Internal Medicine

## 2016-08-11 ENCOUNTER — Encounter: Payer: Self-pay | Admitting: Internal Medicine

## 2016-08-11 VITALS — BP 138/86 | HR 80 | Temp 98.0°F | Resp 12 | Ht 72.0 in | Wt 226.0 lb

## 2016-08-11 DIAGNOSIS — I10 Essential (primary) hypertension: Secondary | ICD-10-CM | POA: Diagnosis not present

## 2016-08-11 DIAGNOSIS — IMO0002 Reserved for concepts with insufficient information to code with codable children: Secondary | ICD-10-CM

## 2016-08-11 DIAGNOSIS — E785 Hyperlipidemia, unspecified: Secondary | ICD-10-CM

## 2016-08-11 DIAGNOSIS — Z0000 Encounter for general adult medical examination without abnormal findings: Secondary | ICD-10-CM | POA: Diagnosis not present

## 2016-08-11 DIAGNOSIS — E1142 Type 2 diabetes mellitus with diabetic polyneuropathy: Secondary | ICD-10-CM

## 2016-08-11 DIAGNOSIS — E1165 Type 2 diabetes mellitus with hyperglycemia: Secondary | ICD-10-CM | POA: Diagnosis not present

## 2016-08-11 DIAGNOSIS — E1365 Other specified diabetes mellitus with hyperglycemia: Secondary | ICD-10-CM

## 2016-08-11 DIAGNOSIS — F17200 Nicotine dependence, unspecified, uncomplicated: Secondary | ICD-10-CM | POA: Diagnosis not present

## 2016-08-11 DIAGNOSIS — E1342 Other specified diabetes mellitus with diabetic polyneuropathy: Secondary | ICD-10-CM

## 2016-08-11 LAB — LIPID PANEL
CHOL/HDL RATIO: 5
CHOLESTEROL: 163 mg/dL (ref 0–200)
HDL: 34.2 mg/dL — ABNORMAL LOW (ref >39.00)
NONHDL: 128.52
TRIGLYCERIDES: 286 mg/dL — AB (ref 0.0–149.0)
VLDL: 57.2 mg/dL — AB (ref 0.0–40.0)

## 2016-08-11 LAB — CBC
HEMATOCRIT: 45.8 % (ref 39.0–52.0)
Hemoglobin: 15.9 g/dL (ref 13.0–17.0)
MCHC: 34.7 g/dL (ref 30.0–36.0)
MCV: 87.8 fl (ref 78.0–100.0)
PLATELETS: 214 10*3/uL (ref 150.0–400.0)
RBC: 5.22 Mil/uL (ref 4.22–5.81)
RDW: 13.2 % (ref 11.5–15.5)
WBC: 8.9 10*3/uL (ref 4.0–10.5)

## 2016-08-11 LAB — MICROALBUMIN / CREATININE URINE RATIO
Creatinine,U: 45.6 mg/dL
Microalb Creat Ratio: 1.8 mg/g (ref 0.0–30.0)
Microalb, Ur: 0.8 mg/dL (ref 0.0–1.9)

## 2016-08-11 LAB — COMPREHENSIVE METABOLIC PANEL
ALT: 18 U/L (ref 0–53)
AST: 14 U/L (ref 0–37)
Albumin: 4.6 g/dL (ref 3.5–5.2)
Alkaline Phosphatase: 65 U/L (ref 39–117)
BUN: 12 mg/dL (ref 6–23)
CALCIUM: 9.5 mg/dL (ref 8.4–10.5)
CHLORIDE: 102 meq/L (ref 96–112)
CO2: 24 meq/L (ref 19–32)
Creatinine, Ser: 0.93 mg/dL (ref 0.40–1.50)
GFR: 85.79 mL/min (ref >60.00)
Glucose, Bld: 240 mg/dL — ABNORMAL HIGH (ref 70–99)
Potassium: 4 mEq/L (ref 3.5–5.1)
Sodium: 135 mEq/L (ref 135–145)
Total Bilirubin: 0.4 mg/dL (ref 0.2–1.2)
Total Protein: 7.3 g/dL (ref 6.0–8.3)

## 2016-08-11 LAB — LDL CHOLESTEROL, DIRECT: Direct LDL: 89 mg/dL

## 2016-08-11 LAB — HEMOGLOBIN A1C: Hgb A1c MFr Bld: 11.5 % — ABNORMAL HIGH (ref 4.6–6.5)

## 2016-08-11 MED ORDER — METOPROLOL SUCCINATE ER 50 MG PO TB24: 50.0000 mg | ORAL_TABLET | Freq: Every day | ORAL | 3 refills | Status: DC

## 2016-08-11 MED ORDER — OMEPRAZOLE 20 MG PO CPDR: 20.0000 mg | DELAYED_RELEASE_CAPSULE | Freq: Two times a day (BID) | ORAL | 3 refills | Status: DC

## 2016-08-11 NOTE — Progress Notes (Signed)
   Subjective:    Patient ID: Brandon Killian., male    DOB: November 25, 1950, 66 y.o.   MRN: [id number]  HPI Here for medicare wellness, no new complaints. Please see A/P for status and treatment of chronic medical problems.   HPI #2: Here for follow up of diabetes (not controlled, peripheral neuropathy which is stable from prior, taking metformin 1000 mg BID, diet is about the same and weight is stable from before), and his cholesterol (taking simvastatin, LDL goal <100, no side effects from medicine), and his blood pressure (taking metoprolol with BP at goal, denies chest pains or headaches or SOB or nausea or vomiting). No new concerns.   Diet: DM since diabetic Physical activity: sedentary Depression/mood screen: negative Hearing: intact to whispered voice Visual acuity: grossly normal with lens, performs annual eye exam  ADLs: capable Fall risk: none Home safety: good Cognitive evaluation: intact to orientation, naming, recall and repetition EOL planning: adv directives discussed  I have personally reviewed and have noted 1. The patient's medical and social history - reviewed today no changes 2. Their use of alcohol, tobacco or illicit drugs 3. Their current medications and supplements 4. The patient's functional ability including ADL's, fall risks, home safety risks and hearing or visual impairment. 5. Diet and physical activities 6. Evidence for depression or mood disorders 7. Care team reviewed and updated (available in snapshot)  Review of Systems  Constitutional: Negative.   HENT: Negative.   Eyes: Negative.   Respiratory: Negative for cough, chest tightness and shortness of breath.   Cardiovascular: Negative for chest pain, palpitations and leg swelling.  Gastrointestinal: Negative for abdominal distention, abdominal pain, constipation, diarrhea, nausea and vomiting.  Musculoskeletal: Negative.   Skin: Negative.   Neurological: Negative.   Psychiatric/Behavioral:  Negative.       Objective:   Physical Exam  Constitutional: He is oriented to person, place, and time. He appears well-developed and well-nourished.  HENT:  Head: Normocephalic and atraumatic.  Eyes: EOM are normal.  Neck: Normal range of motion.  Cardiovascular: Normal rate and regular rhythm.   Pulmonary/Chest: Effort normal and breath sounds normal. No respiratory distress. He has no wheezes. He has no rales.  Abdominal: Soft. Bowel sounds are normal. He exhibits no distension. There is no tenderness. There is no rebound.  Musculoskeletal: He exhibits no edema.  Neurological: He is alert and oriented to person, place, and time. Coordination normal.  Skin: Skin is warm and dry.  Psychiatric: He has a normal mood and affect.   Vitals:   08/11/16 0908  BP: 138/86  Pulse: 80  Resp: 12  Temp: 98 F (36.7 C)  TempSrc: Oral  SpO2: 99%  Weight: 226 lb (102.5 kg)  Height: 6' (1.829 m)   EKG: Rate 80, axis normal, intervals normal, sinus, no st or t wave changes, no change when compared to 2010 EKG.     Assessment & Plan:

## 2016-08-11 NOTE — Patient Instructions (Signed)
The EKG of the heart is not changed from before.   We will check the labs today and call you back with the results.   We have sent in the refills of the medicines today.    Health Maintenance, Male A healthy lifestyle and preventive care is important for your health and wellness. Ask your health care provider about what schedule of regular examinations is right for you. What should I know about weight and diet? Eat a Healthy Diet  Eat plenty of vegetables, fruits, whole grains, low-fat dairy products, and lean protein.  Do not eat a lot of foods high in solid fats, added sugars, or salt.  Maintain a Healthy Weight Regular exercise can help you achieve or maintain a healthy weight. You should:  Do at least 150 minutes of exercise each week. The exercise should increase your heart rate and make you sweat (moderate-intensity exercise).  Do strength-training exercises at least twice a week.  Watch Your Levels of Cholesterol and Blood Lipids  Have your blood tested for lipids and cholesterol every 5 years starting at 68 years of age. If you are at high risk for heart disease, you should start having your blood tested when you are 68 years old. You may need to have your cholesterol levels checked more often if: ? Your lipid or cholesterol levels are high. ? You are older than 68 years of age. ? You are at high risk for heart disease.  What should I know about cancer screening? Many types of cancers can be detected early and may often be prevented. Lung Cancer  You should be screened every year for lung cancer if: ? You are a current smoker who has smoked for at least 30 years. ? You are a former smoker who has quit within the past 15 years.  Talk to your health care provider about your screening options, when you should start screening, and how often you should be screened.  Colorectal Cancer  Routine colorectal cancer screening usually begins at 68 years of age and should be  repeated every 5-10 years until you are 68 years old. You may need to be screened more often if early forms of precancerous polyps or small growths are found. Your health care provider may recommend screening at an earlier age if you have risk factors for colon cancer.  Your health care provider may recommend using home test kits to check for hidden blood in the stool.  A small camera at the end of a tube can be used to examine your colon (sigmoidoscopy or colonoscopy). This checks for the earliest forms of colorectal cancer.  Prostate and Testicular Cancer  Depending on your age and overall health, your health care provider may do certain tests to screen for prostate and testicular cancer.  Talk to your health care provider about any symptoms or concerns you have about testicular or prostate cancer.  Skin Cancer  Check your skin from head to toe regularly.  Tell your health care provider about any new moles or changes in moles, especially if: ? There is a change in a mole's size, shape, or color. ? You have a mole that is larger than a pencil eraser.  Always use sunscreen. Apply sunscreen liberally and repeat throughout the day.  Protect yourself by wearing long sleeves, pants, a wide-brimmed hat, and sunglasses when outside.  What should I know about heart disease, diabetes, and high blood pressure?  If you are 81-102 years of age, have your blood  pressure checked every 3-5 years. If you are 69 years of age or older, have your blood pressure checked every year. You should have your blood pressure measured twice-once when you are at a hospital or clinic, and once when you are not at a hospital or clinic. Record the average of the two measurements. To check your blood pressure when you are not at a hospital or clinic, you can use: ? An automated blood pressure machine at a pharmacy. ? A home blood pressure monitor.  Talk to your health care provider about your target blood  pressure.  If you are between 10-25 years old, ask your health care provider if you should take aspirin to prevent heart disease.  Have regular diabetes screenings by checking your fasting blood sugar level. ? If you are at a normal weight and have a low risk for diabetes, have this test once every three years after the age of 101. ? If you are overweight and have a high risk for diabetes, consider being tested at a younger age or more often.  A one-time screening for abdominal aortic aneurysm (AAA) by ultrasound is recommended for men aged 70-75 years who are current or former smokers. What should I know about preventing infection? Hepatitis B If you have a higher risk for hepatitis B, you should be screened for this virus. Talk with your health care provider to find out if you are at risk for hepatitis B infection. Hepatitis C Blood testing is recommended for:  Everyone born from [birth year] through 1967.  Anyone with known risk factors for hepatitis C.  Sexually Transmitted Diseases (STDs)  You should be screened each year for STDs including gonorrhea and chlamydia if: ? You are sexually active and are younger than 68 years of age. ? You are older than 68 years of age and your health care provider tells you that you are at risk for this type of infection. ? Your sexual activity has changed since you were last screened and you are at an increased risk for chlamydia or gonorrhea. Ask your health care provider if you are at risk.  Talk with your health care provider about whether you are at high risk of being infected with HIV. Your health care provider may recommend a prescription medicine to help prevent HIV infection.  What else can I do?  Schedule regular health, dental, and eye exams.  Stay current with your vaccines (immunizations).  Do not use any tobacco products, such as cigarettes, chewing tobacco, and e-cigarettes. If you need help quitting, ask your health care  provider.  Limit alcohol intake to no more than 2 drinks per day. One drink equals 12 ounces of beer, 5 ounces of wine, or 1 ounces of hard liquor.  Do not use street drugs.  Do not share needles.  Ask your health care provider for help if you need support or information about quitting drugs.  Tell your health care provider if you often feel depressed.  Tell your health care provider if you have ever been abused or do not feel safe at home. This information is not intended to replace advice given to you by your health care provider. Make sure you discuss any questions you have with your health care provider. Document Released: 08/23/2007 Document Revised: 10/24/2015 Document Reviewed: 11/28/2014 Elsevier Interactive Patient Education  [employer].

## 2016-08-13 DIAGNOSIS — Z0000 Encounter for general adult medical examination without abnormal findings: Secondary | ICD-10-CM | POA: Insufficient documentation

## 2016-08-13 NOTE — Assessment & Plan Note (Signed)
Stable peripheral neuropathy, checking HgA1c and lipid panel as well as microalbumin to creatinine ratio. Taking metformin 1000 mg BID and adjust as needed. No low sugars.

## 2016-08-13 NOTE — Assessment & Plan Note (Signed)
Foot exam done, declines any immunizations today including shingles, pneumonia, tetanus. Colonoscopy due in 2022. Eye exam up to date. Counseled about sun safety and mole surveillance. Counseled about the need to stop smoking but he is not interested today. Given 10 year screening recommendations.

## 2016-08-13 NOTE — Assessment & Plan Note (Signed)
LDL goal <100, checking lipid panel and adjust his simvastatin 40 mg daily as needed. No side effects from medications.

## 2016-08-13 NOTE — Assessment & Plan Note (Signed)
EKG done as none recently without changes from prior. Checking CMP and adjust metoprolol as needed. Not complicated at this time.

## 2016-08-13 NOTE — Assessment & Plan Note (Signed)
Declines wanting to quit at this time or cut back. Reminded about the risks and harms of cigarette smoke. Some mild intermittent cough but otherwise no symptoms.

## 2016-08-15 ENCOUNTER — Other Ambulatory Visit: Payer: Self-pay | Admitting: Internal Medicine

## 2016-08-15 MED ORDER — EMPAGLIFLOZIN 25 MG PO TABS: 25.0000 mg | ORAL_TABLET | Freq: Every day | ORAL | 3 refills | Status: DC

## 2016-08-18 ENCOUNTER — Telehealth: Payer: Self-pay

## 2016-08-18 MED ORDER — SIMVASTATIN 40 MG PO TABS: ORAL_TABLET | ORAL | 0 refills | Status: DC

## 2016-08-18 NOTE — Telephone Encounter (Signed)
Med refill

## 2016-10-01 ENCOUNTER — Other Ambulatory Visit: Payer: Self-pay | Admitting: Internal Medicine

## 2016-11-06 ENCOUNTER — Telehealth: Payer: Self-pay | Admitting: Internal Medicine

## 2016-11-06 NOTE — Telephone Encounter (Signed)
Patient states he received emmi call for glucose testing.  Told patient we would set him up for glucose testing on next visit if it is needed.

## 2016-11-12 ENCOUNTER — Other Ambulatory Visit: Payer: Self-pay | Admitting: Family

## 2016-11-27 DIAGNOSIS — T1512XA Foreign body in conjunctival sac, left eye, initial encounter: Secondary | ICD-10-CM | POA: Diagnosis not present

## 2016-12-09 ENCOUNTER — Other Ambulatory Visit: Payer: Self-pay | Admitting: Internal Medicine

## 2016-12-27 ENCOUNTER — Other Ambulatory Visit: Payer: Self-pay | Admitting: Family

## 2017-01-30 ENCOUNTER — Other Ambulatory Visit: Payer: Self-pay | Admitting: Internal Medicine

## 2017-01-31 DIAGNOSIS — Z23 Encounter for immunization: Secondary | ICD-10-CM | POA: Diagnosis not present

## 2017-02-05 ENCOUNTER — Other Ambulatory Visit: Payer: Self-pay | Admitting: Internal Medicine

## 2017-02-08 ENCOUNTER — Other Ambulatory Visit: Payer: Self-pay | Admitting: Internal Medicine

## 2017-02-12 ENCOUNTER — Other Ambulatory Visit: Payer: Self-pay

## 2017-03-04 ENCOUNTER — Other Ambulatory Visit: Payer: Self-pay | Admitting: Internal Medicine

## 2017-03-17 ENCOUNTER — Other Ambulatory Visit: Payer: Self-pay | Admitting: Internal Medicine

## 2017-04-28 ENCOUNTER — Encounter: Payer: Self-pay | Admitting: Internal Medicine

## 2017-04-28 ENCOUNTER — Ambulatory Visit (INDEPENDENT_AMBULATORY_CARE_PROVIDER_SITE_OTHER): Payer: Medicare Other | Admitting: Internal Medicine

## 2017-04-28 ENCOUNTER — Other Ambulatory Visit (INDEPENDENT_AMBULATORY_CARE_PROVIDER_SITE_OTHER): Payer: Medicare Other

## 2017-04-28 VITALS — BP 118/70 | HR 75 | Temp 98.4°F | Ht 72.0 in | Wt 233.1 lb

## 2017-04-28 DIAGNOSIS — E1342 Other specified diabetes mellitus with diabetic polyneuropathy: Secondary | ICD-10-CM | POA: Diagnosis not present

## 2017-04-28 DIAGNOSIS — E1165 Type 2 diabetes mellitus with hyperglycemia: Secondary | ICD-10-CM

## 2017-04-28 DIAGNOSIS — E118 Type 2 diabetes mellitus with unspecified complications: Secondary | ICD-10-CM

## 2017-04-28 DIAGNOSIS — E1365 Other specified diabetes mellitus with hyperglycemia: Secondary | ICD-10-CM | POA: Diagnosis not present

## 2017-04-28 DIAGNOSIS — IMO0002 Reserved for concepts with insufficient information to code with codable children: Secondary | ICD-10-CM

## 2017-04-28 LAB — COMPREHENSIVE METABOLIC PANEL
ALT: 20 U/L (ref 0–53)
AST: 14 U/L (ref 0–37)
Albumin: 4.1 g/dL (ref 3.5–5.2)
Alkaline Phosphatase: 57 U/L (ref 39–117)
BUN: 12 mg/dL (ref 6–23)
CHLORIDE: 104 meq/L (ref 96–112)
CO2: 25 mEq/L (ref 19–32)
Calcium: 9.3 mg/dL (ref 8.4–10.5)
Creatinine, Ser: 0.85 mg/dL (ref 0.40–1.50)
GFR: 94.98 mL/min (ref >60.00)
GLUCOSE: 232 mg/dL — AB (ref 70–99)
POTASSIUM: 4.1 meq/L (ref 3.5–5.1)
Sodium: 137 mEq/L (ref 135–145)
TOTAL PROTEIN: 6.9 g/dL (ref 6.0–8.3)
Total Bilirubin: 0.4 mg/dL (ref 0.2–1.2)

## 2017-04-28 LAB — HEMOGLOBIN A1C: HEMOGLOBIN A1C: 8.8 % — AB (ref 4.6–6.5)

## 2017-04-28 MED ORDER — METFORMIN HCL 1000 MG PO TABS: 1000.0000 mg | ORAL_TABLET | Freq: Two times a day (BID) | ORAL | 1 refills | Status: DC

## 2017-04-28 MED ORDER — EMPAGLIFLOZIN 25 MG PO TABS: 25.0000 mg | ORAL_TABLET | Freq: Every day | ORAL | 1 refills | Status: DC

## 2017-04-28 NOTE — Assessment & Plan Note (Addendum)
Severely uncontrolled at this time with last HgA1c 11.5. He is in denial about the serious nature of his disease and his poor control overall. He is only on metformin now and stopped jardiance due to no follow up. We are not able to assess how helpful this was today. Checking HgA1c but suspect poor control and worsening given poor diet and only taking metformin. Complicated by neuropathy and no eye exam recent. He is reminded about the serious nature of uncontrolled diabetes and the need for follow up every 3 months or more often. He would like to come back in 6 months but agrees to 3 months after discussion. Refill jardiance and metformin. If HgA1c persistently >10 may need consideration of glp-1/long acting insulin combo to help with weight and sugar control. Does not check sugars currently.

## 2017-04-28 NOTE — Progress Notes (Signed)
   Subjective:    Patient ID: Brandon Killian., male    DOB: 1950/10/23, 67 y.o.   MRN: [id number]  HPI The patient is a 68 YO man coming in for refills on medications as his diabetes medications have been cut off due to lack of follow up. He was seen about 8 months ago and had uncontrolled diabetes. He started jardiance per our advice and is still taking metformin. Has been out of jardiance for about 2 months now due to not coming back. Last HgA1c 11.5 and he suspects that today will be worse. He is not aware of how severe this is. He is feeling well and denies having any problems. Diet is poor. Not exercising. Some numbness in his legs which is not changed from before.   Review of Systems  Constitutional: Negative.   HENT: Negative.   Eyes: Negative.   Respiratory: Negative for cough, chest tightness and shortness of breath.   Cardiovascular: Negative for chest pain, palpitations and leg swelling.  Gastrointestinal: Negative for abdominal distention, abdominal pain, constipation, diarrhea, nausea and vomiting.  Musculoskeletal: Negative.   Skin: Negative.   Neurological: Positive for numbness. Negative for dizziness, facial asymmetry, light-headedness and headaches.  Psychiatric/Behavioral: Negative.       Objective:   Physical Exam  Constitutional: He is oriented to person, place, and time. He appears well-developed and well-nourished.  HENT:  Head: Normocephalic and atraumatic.  Eyes: EOM are normal.  Neck: Normal range of motion.  Cardiovascular: Normal rate and regular rhythm.  Pulmonary/Chest: Effort normal and breath sounds normal. No respiratory distress. He has no wheezes. He has no rales.  Abdominal: Soft. Bowel sounds are normal. He exhibits no distension. There is no tenderness. There is no rebound.  Musculoskeletal: He exhibits no edema.  Neurological: He is alert and oriented to person, place, and time. Coordination normal.  Skin: Skin is warm and dry.  Psychiatric:   Not willing to admit how serious his diabetes is and how uncontrolled this is   Vitals:   04/28/17 0841  BP: 118/70  Pulse: 75  Temp: 98.4 F (36.9 C)  TempSrc: Oral  SpO2: 98%  Weight: 233 lb 1.3 oz (105.7 kg)  Height: 6' (1.829 m)      Assessment & Plan:

## 2017-04-28 NOTE — Patient Instructions (Signed)
We will send in the refills today to the pharmacy.   We will see you back in May for a visit with labs.

## 2017-05-25 DIAGNOSIS — H40051 Ocular hypertension, right eye: Secondary | ICD-10-CM | POA: Diagnosis not present

## 2017-05-25 DIAGNOSIS — E119 Type 2 diabetes mellitus without complications: Secondary | ICD-10-CM | POA: Diagnosis not present

## 2017-05-25 DIAGNOSIS — H40013 Open angle with borderline findings, low risk, bilateral: Secondary | ICD-10-CM | POA: Diagnosis not present

## 2017-05-25 DIAGNOSIS — H353131 Nonexudative age-related macular degeneration, bilateral, early dry stage: Secondary | ICD-10-CM | POA: Diagnosis not present

## 2017-05-25 LAB — HM DIABETES EYE EXAM

## 2017-05-27 ENCOUNTER — Encounter: Payer: Self-pay | Admitting: Internal Medicine

## 2017-05-27 NOTE — Progress Notes (Signed)
Abstracted and sent to scan  

## 2017-05-28 ENCOUNTER — Encounter: Payer: Self-pay | Admitting: Internal Medicine

## 2017-05-28 NOTE — Progress Notes (Signed)
Outside notes received. Information abstracted. Notes sent to scan.  

## 2017-07-24 ENCOUNTER — Other Ambulatory Visit: Payer: Self-pay | Admitting: Internal Medicine

## 2017-09-01 NOTE — Progress Notes (Signed)
Subjective:   Brandon Rubio. is a 67 y.o. male who presents for Medicare Annual/Subsequent preventive examination.  Review of Systems:  No ROS.  Medicare Wellness Visit. Additional risk factors are reflected in the social history.  Cardiac Risk Factors include: advanced age (>12men, >20 women);diabetes mellitus;dyslipidemia;hypertension;male gender Sleep patterns: gets up 1 times nightly to void and sleeps 5-6 hours nightly.  Patient reports insomnia issues, discussed recommended sleep tips.   Home Safety/Smoke Alarms: Feels safe in home. Smoke alarms in place.  Living environment; residence and Firearm Safety: 1-story house/ trailer, no firearms. Lives alone, no needs for DME, good support system Seat Belt Safety/Bike Helmet: Wears seat belt.     Objective:    Vitals: BP (!) 132/58   Pulse 70   Resp 18   Ht 6' (1.829 m)   Wt 223 lb (101.2 kg)   SpO2 99%   BMI 30.24 kg/m   Body mass index is 30.24 kg/m.  Advanced Directives 09/02/2017 04/17/2015 04/03/2015  Does Patient Have a Medical Advance Directive? Yes No Yes  Type of Paramedic of [place];Living will - Living will  Copy of [place] in Chart? No - copy requested - -    Tobacco Social History   Tobacco Use  Smoking Status Smoker, Current Status Unknown  . Packs/day: 1.50  Smokeless Tobacco Never Used  Tobacco Comment   smoked 1964- present, up to 2 ppd. 09/22/12 : 1.5 ppd     Ready to quit: Not Answered Counseling given: Not Answered Comment: smoked 1964- present, up to 2 ppd. 09/22/12 : 1.5 ppd  Past Medical History:  Diagnosis Date  . CAD (coronary artery disease)   . Cancer ([place])    SKIN.[name]  . Cataract   . Diabetes mellitus    2  . GERD (gastroesophageal reflux disease)   . Hyperlipidemia   . Hypertension   . Myocardial infarct, old    2003  . Peripheral neuropathy    hands   Past Surgical History:  Procedure Laterality Date  .  COLONOSCOPY W/ POLYPECTOMY  2011  . LARYNGOSCOPY  1999   Family History  Problem Relation Age of Onset  . Colon polyps Father   . Colon cancer Father 78  . Coronary artery disease Father   . Lung disease Father   . Lung cancer Mother        X 2   Social History   Socioeconomic History  . Marital status: Divorced    Spouse name: Not on file  . Number of children: Not on file  . Years of education: Not on file  . Highest education level: Not on file  Occupational History  . Not on file  Social Needs  . Financial resource strain: Not hard at all  . Food insecurity:    Worry: Never true    Inability: Never true  . Transportation needs:    Medical: No    Non-medical: No  Tobacco Use  . Smoking status: Smoker, Current Status Unknown    Packs/day: 1.50  . Smokeless tobacco: Never Used  . Tobacco comment: smoked 1964- present, up to 2 ppd. 09/22/12 : 1.5 ppd  Substance and Sexual Activity  . Alcohol use: No    Alcohol/week: 0.0 oz    Comment:  none since 04/2012  . Drug use: No  . Sexual activity: Not on file  Lifestyle  . Physical activity:    Days per week: 3 days  Minutes per session: 60 min  . Stress: Not at all  Relationships  . Social connections:    Talks on phone: More than three times a week    Gets together: More than three times a week    Attends religious service: Not on file    Active member of club or organization: Not on file    Attends meetings of clubs or organizations: Not on file    Relationship status: Not on file  Other Topics Concern  . Not on file  Social History Narrative   REG EXERCISE          Outpatient Encounter Medications as of 09/02/2017  Medication Sig  . aspirin 81 MG tablet Take 81 mg by mouth daily.    . empagliflozin (JARDIANCE) 25 MG TABS tablet Take 25 mg by mouth daily.  Marland Kitchen glucose blood (FREESTYLE LITE) test strip USE TO TEST BLOOD SUGAR TWICE DAILY ICD E11.49  . metFORMIN (GLUCOPHAGE) 1000 MG tablet Take 1 tablet (1,000  mg total) by mouth 2 (two) times daily with a meal.  . metoprolol succinate (TOPROL-XL) 50 MG 24 hr tablet TAKE 1 TABLET BY MOUTH DAILY WITH OR IMMEDIATELY FOLLOWING A MEAL.  . Multiple Vitamin (MULTIVITAMIN) tablet Take 1 tablet by mouth daily.    Marland Kitchen omeprazole (PRILOSEC) 20 MG capsule TAKE ONE CAPSULE BY MOUTH TWICE DAILY BEFORE A MEAL.  . simvastatin (ZOCOR) 40 MG tablet TAKE 1/2 TABLET BY MOUTH EVERY NIGHT AT BEDTIME   No facility-administered encounter medications on file as of 09/02/2017.     Activities of Daily Living In your present state of health, do you have any difficulty performing the following activities: 09/02/2017  Hearing? N  Vision? N  Difficulty concentrating or making decisions? N  Walking or climbing stairs? N  Dressing or bathing? N  Doing errands, shopping? N  Preparing Food and eating ? N  Using the Toilet? N  In the past six months, have you accidently leaked urine? N  Do you have problems with loss of bowel control? N  Managing your Medications? N  Managing your Finances? N  Housekeeping or managing your Housekeeping? N  Some recent data might be hidden    Patient Care Team: [name], MD as PCP - General (Internal Medicine) Milus Banister, MD as Attending Physician (Gastroenterology)   Assessment:   This is a routine wellness examination for Brandon Rubio. Physical assessment deferred to PCP.   Exercise Activities and Dietary recommendations Current Exercise Habits: The patient does not participate in regular exercise at present  Diet (meal preparation, eat out, water intake, caffeinated beverages, dairy products, fruits and vegetables): in general, a "healthy" diet  , well balanced   Reviewed heart healthy and diabetic diet. Encouraged patient to increase daily water and fluid intake.   Goals    . Patient Stated     I want to increase my physical by walking more and playing golf.       Fall Risk Fall Risk  09/02/2017 04/28/2017  02/12/2017 05/11/2015  Falls in the past year? No No No No  Comment - - Emmi Telephone Survey: data to providers prior to load -    Depression Screen PHQ 2/9 Scores 09/02/2017 04/28/2017 05/11/2015  PHQ - 2 Score 0 0 0  Exception Documentation Patient refusal - -    Cognitive Function       Ad8 score reviewed for issues:  Issues making decisions: no  Less interest in hobbies / activities: no  Repeats questions, stories (family complaining): no  Trouble using ordinary gadgets (microwave, computer, phone):no  Forgets the month or year: no  Mismanaging finances: no  Remembering appts: no  Daily problems with thinking and/or memory: no Ad8 score is= 0  Immunization History  Administered Date(s) Administered  . Influenza,inj,Quad PF,6+ Mos 05/11/2015  . Influenza-Unspecified 01/02/2013, 01/03/2014  . Pneumococcal Conjugate-13 09/02/2017  . Zoster 01/03/2014   Screening Tests Health Maintenance  Topic Date Due  . TETANUS/TDAP  04/12/1967  . URINE MICROALBUMIN  08/11/2017  . INFLUENZA VACCINE  10/08/2017  . HEMOGLOBIN A1C  10/26/2017  . OPHTHALMOLOGY EXAM  05/26/2018  . FOOT EXAM  09/03/2018  . PNA vac Low Risk Adult (2 of 2 - PPSV23) 09/03/2018  . COLONOSCOPY  04/16/2020  . Hepatitis C Screening  Completed      Plan:    Continue doing brain stimulating activities (puzzles, reading, adult coloring books, staying active) to keep memory sharp.   Continue to eat heart healthy diet (full of fruits, vegetables, whole grains, lean protein, water--limit salt, fat, and sugar intake) and increase physical activity as tolerated.  I have personally reviewed and noted the following in the patient's chart:   . Medical and social history . Use of alcohol, tobacco or illicit drugs  . Current medications and supplements . Functional ability and status . Nutritional status . Physical activity . Advanced directives . List of other physicians . Vitals . Screenings to include  cognitive, depression, and falls . Referrals and appointments  In addition, I have reviewed and discussed with patient certain preventive protocols, quality metrics, and best practice recommendations. A written personalized care plan for preventive services as well as general preventive health recommendations were provided to patient.     Michiel Cowboy, RN  09/02/2017

## 2017-09-02 ENCOUNTER — Ambulatory Visit (INDEPENDENT_AMBULATORY_CARE_PROVIDER_SITE_OTHER): Payer: Medicare Other | Admitting: *Deleted

## 2017-09-02 VITALS — BP 132/58 | HR 70 | Resp 18 | Ht 72.0 in | Wt 223.0 lb

## 2017-09-02 DIAGNOSIS — Z0000 Encounter for general adult medical examination without abnormal findings: Secondary | ICD-10-CM

## 2017-09-02 DIAGNOSIS — E1365 Other specified diabetes mellitus with hyperglycemia: Secondary | ICD-10-CM | POA: Diagnosis not present

## 2017-09-02 DIAGNOSIS — Z23 Encounter for immunization: Secondary | ICD-10-CM

## 2017-09-02 DIAGNOSIS — E1342 Other specified diabetes mellitus with diabetic polyneuropathy: Secondary | ICD-10-CM

## 2017-09-02 DIAGNOSIS — IMO0002 Reserved for concepts with insufficient information to code with codable children: Secondary | ICD-10-CM

## 2017-09-02 NOTE — Patient Instructions (Addendum)
Continue doing brain stimulating activities (puzzles, reading, adult coloring books, staying active) to keep memory sharp.   Continue to eat heart healthy diet (full of fruits, vegetables, whole grains, lean protein, water--limit salt, fat, and sugar intake) and increase physical activity as tolerated.   Brandon Rubio , Thank you for taking time to come for your Medicare Wellness Visit. I appreciate your ongoing commitment to your health goals. Please review the following plan we discussed and let me know if I can assist you in the future.   These are the goals we discussed: Goals    . Patient Stated     I want to increase my physical by walking more and playing golf.       This is a list of the screening recommended for you and due dates:  Health Maintenance  Topic Date Due  . Tetanus Vaccine  04/12/1967  . Urine Protein Check  08/11/2017  . Flu Shot  10/08/2017  . Hemoglobin A1C  10/26/2017  . Eye exam for diabetics  05/26/2018  . Complete foot exam   09/03/2018  . Pneumonia vaccines (2 of 2 - PPSV23) 09/03/2018  . Colon Cancer Screening  04/16/2020  .  Hepatitis C: One time screening is recommended by Center for Disease Control  (CDC) for  adults born from [birth year] through 1965.   Completed

## 2017-09-02 NOTE — Progress Notes (Signed)
Medical screening examination/treatment/procedure(s) were performed by non-physician practitioner and as supervising physician I was immediately available for consultation/collaboration. I agree with above. Jaxtin Raimondo A Yarethzy Croak, MD 

## 2017-10-22 ENCOUNTER — Other Ambulatory Visit: Payer: Self-pay | Admitting: Internal Medicine

## 2017-10-22 NOTE — Telephone Encounter (Signed)
Can give 30 day supply but needs visit within that time frame.

## 2017-11-18 ENCOUNTER — Encounter: Payer: Self-pay | Admitting: Internal Medicine

## 2017-11-18 ENCOUNTER — Ambulatory Visit (INDEPENDENT_AMBULATORY_CARE_PROVIDER_SITE_OTHER): Payer: Medicare Other | Admitting: Internal Medicine

## 2017-11-18 VITALS — BP 110/60 | HR 78 | Temp 97.9°F | Ht 72.0 in | Wt 229.0 lb

## 2017-11-18 DIAGNOSIS — Z23 Encounter for immunization: Secondary | ICD-10-CM

## 2017-11-18 DIAGNOSIS — E1142 Type 2 diabetes mellitus with diabetic polyneuropathy: Secondary | ICD-10-CM

## 2017-11-18 LAB — POCT GLYCOSYLATED HEMOGLOBIN (HGB A1C): Hemoglobin A1C: 7.8 % — AB (ref 4.0–5.6)

## 2017-11-18 MED ORDER — EMPAGLIFLOZIN 25 MG PO TABS: 25.0000 mg | ORAL_TABLET | Freq: Every day | ORAL | 3 refills | Status: DC

## 2017-11-18 MED ORDER — METFORMIN HCL 1000 MG PO TABS: 1000.0000 mg | ORAL_TABLET | Freq: Two times a day (BID) | ORAL | 3 refills | Status: DC

## 2017-11-18 NOTE — Progress Notes (Signed)
   Subjective:    Patient ID: Brandon Rubio., male    DOB: [birth year], 67 y.o.   MRN: [id number]  HPI The patient is a 67 YO man coming in for poorly controlled diabetes. He was supposed to follow up in May of this year but decided to wait until now. He was given notice that he would not get any refills without visit and decided to come in. Last HgA1c 8.8. Does have neuropathy which is fairly stable since last visit. Denies any complaints. Taking jardiance and metformin without problems.   HgA1c POC: 7.8  Review of Systems  Constitutional: Negative.   HENT: Negative.   Eyes: Negative.   Respiratory: Negative for cough, chest tightness and shortness of breath.   Cardiovascular: Negative for chest pain, palpitations and leg swelling.  Gastrointestinal: Negative for abdominal distention, abdominal pain, constipation, diarrhea, nausea and vomiting.  Musculoskeletal: Negative.   Skin: Negative.   Neurological: Negative.   Psychiatric/Behavioral: Negative.       Objective:   Physical Exam  Constitutional: He is oriented to person, place, and time. He appears well-developed and well-nourished.  HENT:  Head: Normocephalic and atraumatic.  Eyes: EOM are normal.  Neck: Normal range of motion.  Cardiovascular: Normal rate and regular rhythm.  Pulmonary/Chest: Effort normal and breath sounds normal. No respiratory distress. He has no wheezes. He has no rales.  Abdominal: Soft. He exhibits no distension. There is no tenderness. There is no rebound.  Musculoskeletal: He exhibits no edema.  Neurological: He is alert and oriented to person, place, and time. Coordination normal.  Skin: Skin is warm and dry.  Psychiatric: He has a normal mood and affect.   Vitals:   11/18/17 0828  BP: 110/60  Pulse: 78  Temp: 97.9 F (36.6 C)  TempSrc: Oral  SpO2: 95%  Weight: 229 lb (103.9 kg)  Height: 6' (1.829 m)      Assessment & Plan:  Flu and Tdap given at visit

## 2017-11-18 NOTE — Assessment & Plan Note (Signed)
Stable neuropathy and not on meds. Taking jardiance and metformin and POC HgA1c 7.8 at the visit which is at goal of <8. Taking statin as well. Continue current meds.

## 2017-11-18 NOTE — Patient Instructions (Signed)
We will see you back in about 6-12 months to check on the sugars.   Work on changing the diet some to help with the sugars. If you are able to do that we could probably stop the jardiance.

## 2017-11-21 ENCOUNTER — Other Ambulatory Visit: Payer: Self-pay | Admitting: Internal Medicine

## 2018-01-20 ENCOUNTER — Other Ambulatory Visit: Payer: Self-pay | Admitting: Internal Medicine

## 2018-04-20 ENCOUNTER — Other Ambulatory Visit: Payer: Self-pay | Admitting: Internal Medicine

## 2018-10-07 ENCOUNTER — Other Ambulatory Visit: Payer: Self-pay

## 2018-10-15 ENCOUNTER — Telehealth: Payer: Self-pay | Admitting: Internal Medicine

## 2018-10-15 NOTE — Telephone Encounter (Signed)
Called patient to schedule AWV, no answer. Will try to call patient back at later time. SF

## 2018-10-17 ENCOUNTER — Other Ambulatory Visit: Payer: Self-pay | Admitting: Internal Medicine

## 2018-11-23 ENCOUNTER — Other Ambulatory Visit: Payer: Self-pay | Admitting: *Deleted

## 2018-11-23 DIAGNOSIS — Z23 Encounter for immunization: Secondary | ICD-10-CM | POA: Diagnosis not present

## 2018-11-23 MED ORDER — JARDIANCE 25 MG PO TABS: 25.0000 mg | ORAL_TABLET | Freq: Every day | ORAL | 0 refills | Status: DC

## 2019-01-15 ENCOUNTER — Other Ambulatory Visit: Payer: Self-pay | Admitting: Internal Medicine

## 2019-01-26 ENCOUNTER — Other Ambulatory Visit: Payer: Self-pay | Admitting: Internal Medicine

## 2019-01-27 ENCOUNTER — Encounter: Payer: Self-pay | Admitting: Internal Medicine

## 2019-01-27 ENCOUNTER — Ambulatory Visit (INDEPENDENT_AMBULATORY_CARE_PROVIDER_SITE_OTHER): Payer: Medicare Other | Admitting: Internal Medicine

## 2019-01-27 ENCOUNTER — Other Ambulatory Visit (INDEPENDENT_AMBULATORY_CARE_PROVIDER_SITE_OTHER): Payer: Medicare Other

## 2019-01-27 ENCOUNTER — Other Ambulatory Visit: Payer: Self-pay

## 2019-01-27 VITALS — BP 120/80 | HR 73 | Temp 98.2°F | Ht 72.0 in | Wt 227.0 lb

## 2019-01-27 DIAGNOSIS — E1169 Type 2 diabetes mellitus with other specified complication: Secondary | ICD-10-CM

## 2019-01-27 DIAGNOSIS — E1142 Type 2 diabetes mellitus with diabetic polyneuropathy: Secondary | ICD-10-CM | POA: Diagnosis not present

## 2019-01-27 DIAGNOSIS — I10 Essential (primary) hypertension: Secondary | ICD-10-CM

## 2019-01-27 DIAGNOSIS — E785 Hyperlipidemia, unspecified: Secondary | ICD-10-CM

## 2019-01-27 DIAGNOSIS — Z0000 Encounter for general adult medical examination without abnormal findings: Secondary | ICD-10-CM

## 2019-01-27 DIAGNOSIS — F17200 Nicotine dependence, unspecified, uncomplicated: Secondary | ICD-10-CM

## 2019-01-27 DIAGNOSIS — K219 Gastro-esophageal reflux disease without esophagitis: Secondary | ICD-10-CM | POA: Diagnosis not present

## 2019-01-27 LAB — CBC
HCT: 47.3 % (ref 39.0–52.0)
Hemoglobin: 16.1 g/dL (ref 13.0–17.0)
MCHC: 34.1 g/dL (ref 30.0–36.0)
MCV: 88.2 fl (ref 78.0–100.0)
Platelets: 224 10*3/uL (ref 150.0–400.0)
RBC: 5.36 Mil/uL (ref 4.22–5.81)
RDW: 13 % (ref 11.5–15.5)
WBC: 8.7 10*3/uL (ref 4.0–10.5)

## 2019-01-27 LAB — COMPREHENSIVE METABOLIC PANEL
ALT: 19 U/L (ref 0–53)
AST: 13 U/L (ref 0–37)
Albumin: 4.6 g/dL (ref 3.5–5.2)
Alkaline Phosphatase: 63 U/L (ref 39–117)
BUN: 22 mg/dL (ref 6–23)
CO2: 23 mEq/L (ref 19–32)
Calcium: 9.5 mg/dL (ref 8.4–10.5)
Chloride: 103 mEq/L (ref 96–112)
Creatinine, Ser: 1.01 mg/dL (ref 0.40–1.50)
GFR: 72.86 mL/min (ref >60.00)
Glucose, Bld: 197 mg/dL — ABNORMAL HIGH (ref 70–99)
Potassium: 4.2 mEq/L (ref 3.5–5.1)
Sodium: 137 mEq/L (ref 135–145)
Total Bilirubin: 0.5 mg/dL (ref 0.2–1.2)
Total Protein: 7.6 g/dL (ref 6.0–8.3)

## 2019-01-27 LAB — LDL CHOLESTEROL, DIRECT: Direct LDL: 87 mg/dL

## 2019-01-27 LAB — HEMOGLOBIN A1C: Hgb A1c MFr Bld: 9 % — ABNORMAL HIGH (ref 4.6–6.5)

## 2019-01-27 LAB — LIPID PANEL
Cholesterol: 172 mg/dL (ref 0–200)
HDL: 35.5 mg/dL — ABNORMAL LOW (ref >39.00)
NonHDL: 136.45
Total CHOL/HDL Ratio: 5
Triglycerides: 370 mg/dL — ABNORMAL HIGH (ref 0.0–149.0)
VLDL: 74 mg/dL — ABNORMAL HIGH (ref 0.0–40.0)

## 2019-01-27 MED ORDER — OMEPRAZOLE 20 MG PO CPDR: 20.0000 mg | DELAYED_RELEASE_CAPSULE | Freq: Two times a day (BID) | ORAL | 3 refills | Status: DC

## 2019-01-27 MED ORDER — METFORMIN HCL 1000 MG PO TABS: 1000.0000 mg | ORAL_TABLET | Freq: Two times a day (BID) | ORAL | 3 refills | Status: DC

## 2019-01-27 MED ORDER — JARDIANCE 25 MG PO TABS: 25.0000 mg | ORAL_TABLET | Freq: Every day | ORAL | 3 refills | Status: DC

## 2019-01-27 MED ORDER — METOPROLOL SUCCINATE ER 50 MG PO TB24: 50.0000 mg | ORAL_TABLET | Freq: Every day | ORAL | 3 refills | Status: DC

## 2019-01-27 MED ORDER — SIMVASTATIN 40 MG PO TABS: ORAL_TABLET | ORAL | 3 refills | Status: DC

## 2019-01-27 NOTE — Assessment & Plan Note (Signed)
Flu shot up to date for season. Pneumonia declines 23. Shingrix counseled. Tetanus due 2029. Colonoscopy due 2022. Counseled about sun safety and mole surveillance. Counseled about the dangers of distracted driving. Given 10 year screening recommendations.

## 2019-01-27 NOTE — Assessment & Plan Note (Signed)
Counseled to quit and he declines. Still smoking about 1.5 PPD.

## 2019-01-27 NOTE — Assessment & Plan Note (Signed)
BP at goal on metoprolol. Checking CMP and adjust as needed.

## 2019-01-27 NOTE — Assessment & Plan Note (Signed)
Checking lipid panel and adjust simvastatin as needed. Goal LDL <70.

## 2019-01-27 NOTE — Patient Instructions (Signed)

## 2019-01-27 NOTE — Assessment & Plan Note (Signed)
Taking omeprazole and controlled. Refilled today.

## 2019-01-27 NOTE — Assessment & Plan Note (Signed)
Foot exam done, reminded about eye exam. Checking HgA1c and microalbumin to creatinine ratio. On statin. Taking metformin and jardiance and adjust as needed. Complicated by neuropathy and lipidemia.

## 2019-01-27 NOTE — Progress Notes (Signed)
Subjective:   Patient ID: Brandon Killian., male    DOB: [birth year], 68 y.o.   MRN: [id number]  HPI Here for medicare wellness, no new complaints. Please see A/P for status and treatment of chronic medical problems.   HPI #2: Here for follow up diabetes (stable neuropathy, checks feet regularly, denies low sugars, taking metformin and jardiance, on statin) and blood pressure (taking metoprolol and denies lightheadedness, denies chest pains or headaches, denies side effects) and hyperlipidemia (taking simvastatin 1/2 pill daily, denies side effects, denies chest pains or stroke symptoms).   Diet: DM since diabetic Physical activity: sedentary Depression/mood screen: negative Hearing: intact to whispered voice Visual acuity: grossly normal, overdue for annual eye exam  ADLs: capable Fall risk: none Home safety: good Cognitive evaluation: intact to orientation, naming, recall and repetition EOL planning: adv directives discussed    Office Visit from 01/27/2019 in [facility]  PHQ-2 Total Score  0        I have personally reviewed and have noted 1. The patient's medical and social history - reviewed today no changes 2. Their use of alcohol, tobacco or illicit drugs 3. Their current medications and supplements 4. The patient's functional ability including ADL's, fall risks, home safety risks and hearing or visual impairment. 5. Diet and physical activities 6. Evidence for depression or mood disorders 7. Care team reviewed and updated  Patient Care Team: [name], MD as PCP - General (Internal Medicine) Milus Banister, MD as Attending Physician (Gastroenterology) Past Medical History:  Diagnosis Date  . CAD (coronary artery disease)   . Cancer ([place])    SKIN.[name]  . Cataract   . Diabetes mellitus    2  . GERD (gastroesophageal reflux disease)   . Hyperlipidemia   . Hypertension   . Myocardial infarct, old    2003  .  Peripheral neuropathy    hands   Past Surgical History:  Procedure Laterality Date  . COLONOSCOPY W/ POLYPECTOMY  2011  . LARYNGOSCOPY  1999   Family History  Problem Relation Age of Onset  . Colon polyps Father   . Colon cancer Father 81  . Coronary artery disease Father   . Lung disease Father   . Lung cancer Mother        X 2     Review of Systems  Constitutional: Negative.   HENT: Negative.   Eyes: Negative.   Respiratory: Negative for cough, chest tightness and shortness of breath.   Cardiovascular: Negative for chest pain, palpitations and leg swelling.  Gastrointestinal: Negative for abdominal distention, abdominal pain, constipation, diarrhea, nausea and vomiting.  Musculoskeletal: Negative.   Skin: Negative.   Neurological: Negative.   Psychiatric/Behavioral: Negative.     Objective:  Physical Exam Constitutional:      Appearance: He is well-developed.  HENT:     Head: Normocephalic and atraumatic.  Neck:     Musculoskeletal: Normal range of motion.  Cardiovascular:     Rate and Rhythm: Normal rate and regular rhythm.  Pulmonary:     Effort: Pulmonary effort is normal. No respiratory distress.     Breath sounds: Normal breath sounds. No wheezing or rales.     Comments: Smelling of tobacco smoke Abdominal:     General: Bowel sounds are normal. There is no distension.     Palpations: Abdomen is soft.     Tenderness: There is no abdominal tenderness. There is no rebound.  Skin:    General: Skin  is warm and dry.  Neurological:     Mental Status: He is alert and oriented to person, place, and time.     Coordination: Coordination normal.     Vitals:   01/27/19 0953  BP: 120/80  Pulse: 73  Temp: 98.2 F (36.8 C)  TempSrc: Oral  SpO2: 96%  Weight: 227 lb (103 kg)  Height: 6' (1.829 m)    Assessment & Plan:

## 2019-05-26 ENCOUNTER — Telehealth: Payer: Self-pay | Admitting: Internal Medicine

## 2019-05-26 DIAGNOSIS — E1142 Type 2 diabetes mellitus with diabetic polyneuropathy: Secondary | ICD-10-CM

## 2019-05-26 NOTE — Progress Notes (Signed)
  Chronic Care Management   Note  05/26/2019 Name: Brandon Rubio. MRN: [id number] DOB: 03-15-1950  Brandon Rubio. is a 69 y.o. year old male who is a primary care patient of [name], MD. I reached out to [name]. by phone today in response to a referral sent by [name]'s PCP, [name], MD.   [name] was given information about Chronic Care Management services today including:  1. CCM service includes personalized support from designated clinical staff supervised by his physician, including individualized plan of care and coordination with other care providers 2. 24/7 contact phone numbers for assistance for urgent and routine care needs. 3. Service will only be billed when office clinical staff spend 20 minutes or more in a month to coordinate care. 4. Only one practitioner may furnish and bill the service in a calendar month. 5. The patient may stop CCM services at any time (effective at the end of the month) by phone call to the office staff.   Patient agreed to services and verbal consent obtained.   Follow up plan:   Brandon Rubio [employer] Scheduler

## 2019-06-13 NOTE — Addendum Note (Signed)
Addended by: Aviva Signs M on: 06/13/2019 10:12 PM   Modules accepted: Orders

## 2019-06-14 DIAGNOSIS — Z23 Encounter for immunization: Secondary | ICD-10-CM | POA: Diagnosis not present

## 2019-06-16 ENCOUNTER — Ambulatory Visit: Payer: Medicare Other | Admitting: Pharmacist

## 2019-06-16 ENCOUNTER — Other Ambulatory Visit: Payer: Self-pay

## 2019-06-16 DIAGNOSIS — IMO0002 Reserved for concepts with insufficient information to code with codable children: Secondary | ICD-10-CM

## 2019-06-16 DIAGNOSIS — F17200 Nicotine dependence, unspecified, uncomplicated: Secondary | ICD-10-CM

## 2019-06-16 DIAGNOSIS — K219 Gastro-esophageal reflux disease without esophagitis: Secondary | ICD-10-CM

## 2019-06-16 DIAGNOSIS — I10 Essential (primary) hypertension: Secondary | ICD-10-CM

## 2019-06-16 DIAGNOSIS — E1169 Type 2 diabetes mellitus with other specified complication: Secondary | ICD-10-CM

## 2019-06-16 DIAGNOSIS — E1342 Other specified diabetes mellitus with diabetic polyneuropathy: Secondary | ICD-10-CM

## 2019-06-16 NOTE — Chronic Care Management (AMB) (Signed)
Chronic Care Management Pharmacy  Name: Brandon Rubio.  MRN: [id number] DOB: 1951/02/21   Chief Complaint/ HPI  Brandon Killian.,  69 y.o. , male presents for their Initial CCM visit with the clinical pharmacist via telephone due to COVID-19 Pandemic.  PCP : [name], MD  Their chronic conditions include: HTN, CAD, T2DM, HLD, GERD, tobacco use  Office Visits: 01/27/19 Dr [name]: counseled to quit smoking, not ready.   Consult Visit: n/a  Medications: Outpatient Encounter Medications as of 06/16/2019  Medication Sig  . aspirin 81 MG tablet Take 81 mg by mouth daily.    . empagliflozin (JARDIANCE) 25 MG TABS tablet Take 25 mg by mouth daily.  . metFORMIN (GLUCOPHAGE) 1000 MG tablet Take 1 tablet (1,000 mg total) by mouth 2 (two) times daily with a meal.  . metoprolol succinate (TOPROL-XL) 50 MG 24 hr tablet Take 1 tablet (50 mg total) by mouth daily.  . Multiple Vitamin (MULTIVITAMIN) tablet Take 1 tablet by mouth daily.    Marland Kitchen omeprazole (PRILOSEC) 20 MG capsule Take 1 capsule (20 mg total) by mouth 2 (two) times daily before a meal.  . simvastatin (ZOCOR) 40 MG tablet Take 1/2 tablet by mouth every night at bedtime.  Marland Kitchen glucose blood (FREESTYLE LITE) test strip USE TO TEST BLOOD SUGAR TWICE DAILY ICD E11.49 (Patient not taking: Reported on 06/16/2019)   No facility-administered encounter medications on file as of 06/16/2019.     Current Diagnosis/Assessment:  SDOH Interventions     Most Recent Value  SDOH Interventions  SDOH Interventions for the Following Domains  Alcohol Usage  Alcohol Brief Interventions/Follow-up  AUDIT Score <7 follow-up not indicated     Goals Addressed            This Visit's Progress   . Diabetes: A1c < 8%       CARE PLAN ENTRY (see longtitudinal plan of care for additional care plan information)  Current Barriers:  . Diabetes: uncontrolled; complicated by chronic medical conditions including CAD, HLD Lab Results    Component Value Date   HGBA1C 9.0 (H) 01/27/2019 .   Lab Results  Component Value Date   CREATININE 1.01 01/27/2019   CREATININE 0.85 04/28/2017   CREATININE 0.93 08/11/2016   . No results found for: EGFR . Current antihyperglycemic regimen: metformin 100 mg BID, Jardiance 25 mg daily . Denies hypoglycemic symptoms, including dizziness, lightheadedness, shaking, sweating . Denies hyperglycemic symptoms, including polyuria, polydipsia, polyphagia, nocturia, blurred vision, neuropathy . Current exercise: little to none . Current blood glucose readings: not checking . Cardiovascular risk reduction: o Current hypertensive regimen: metoprolol succinate 50 mg daily o Current hyperlipidemia regimen: simvastatin 20 mg HS o Current antiplatelet regimen: aspirin 81 mg  Pharmacist Clinical Goal(s):  Marland Kitchen Over the next 180 days, patient will work with PharmD and primary care provider to address elevated A1c . A1c goal < 8.0%  Interventions: . Comprehensive medication review performed, medication list updated in electronic medical record . Discussed diabetic diet at length, including plate method o If unable to reduce A1c through diet alone, we will consider switching Jardiance to Rybelsus for enhanced A1c reduction and weight loss  Patient Self Care Activities:  . Patient will check blood glucose several times per week at different times , document, and provide at future appointments . Patient will take medications as prescribed . Patient will report any questions or concerns to provider   Initial goal documentation        Hypertension  Office blood pressures are  BP Readings from Last 3 Encounters:  01/27/19 120/80  11/18/17 110/60  09/02/17 (!) 132/58    Patient has failed these meds in the past: n/a Patient is currently controlled on the following medications: metoprolol succinate 50 mg daily  Patient checks BP at home never  Patient home BP readings are ranging: n/a  We  discussed diet and exercise extensively  Plan  Continue current medications and control with diet and exercise     Hyperlipidemia/CAD   Lipid Panel     Component Value Date/Time   CHOL 172 01/27/2019 1022   TRIG 370.0 (H) 01/27/2019 1022   HDL 35.50 (L) 01/27/2019 1022   CHOLHDL 5 01/27/2019 1022   VLDL 74.0 (H) 01/27/2019 1022   LDLDIRECT 87.0 01/27/2019 1022    CAD: NSTEMI 2009, medically managed, no stents.  Patient has failed these meds in past: n/a Patient is currently controlled on the following medications: simvastatin 20 mg (1/2 of 40 mg) HS, aspirin 81 mg daily  We discussed:  diet and exercise extensively  Plan  Continue current medications and control with diet and exercise    Diabetes   Recent Relevant Labs: Lab Results  Component Value Date/Time   HGBA1C 9.0 (H) 01/27/2019 10:22 AM   HGBA1C 7.8 (A) 11/18/2017 11:05 AM   HGBA1C 8.8 (H) 04/28/2017 08:52 AM   MICROALBUR 0.8 08/11/2016 09:55 AM   MICROALBUR 1.9 06/22/2014 08:10 AM    Lab Results  Component Value Date   CREATININE 1.01 01/27/2019   CREATININE 0.85 04/28/2017   CREATININE 0.93 08/11/2016   Checking BG: Never  Patient has failed these meds in past: n/a Patient is currently uncontrolled on the following medications: metformin 1000 mg BID, Jardiance 25 mg daily  Last diabetic Eye exam:  Lab Results  Component Value Date/Time   HMDIABEYEEXA No Retinopathy 05/25/2017 12:00 AM    Last diabetic Foot exam: No results found for: HMDIABFOOTEX   We discussed: diet and exercise extensively, hasn't been exercising, diet is "crap". Has been to a nutritionist before. Discussed how carbs effect blood sugar. Discussed consequences of elevated blood sugar including heart attack and stroke. Went over plate method and encouraged exercise.  Jardiance - $446/90 days due to deductible. Per formulary next refill will be $35-45/month. Discussed patient assistance if he enters the donut  hole.  Plan  Continue current medications and control with diet and exercise  Reduce carbohydrates and portion sizes   Tobacco Use   Tobacco Status:  Social History   Tobacco Use  Smoking Status Smoker, Current Status Unknown  . Packs/day: 1.50  Smokeless Tobacco Never Used  Tobacco Comment   smoked 1964- present, up to 2 ppd. 09/22/12 : 1.5 ppd   Patient has failed these meds in past: none Previous quit attempts: Quit for 8-9 months about 20 yrs ago, cold [place]  We discussed:  smoking cessation, benefits of using NRT or Chantix, bupropion for cravings. Answered questions about side effects of Chantix.   Plan  Patient will let me know if he gets more serious about quitting.   GERD   Patient has failed these meds in past: n/a Patient is currently controlled on the following medications: omeprazole 20 mg BID  We discussed:  Pt does not endrose reflux issues  Plan  Continue current medications    Health Maintenance   Patient is currently controlled on the following medications: multivitamin  We discussed:  Patient is satisfied with current OTC regimen and denies issues  Plan  Continue current medications   Medication Management   Pt uses [facility] for all medications Does not use pill box Pt endorses compliance  We discussed: Pt is happy with [facility], prescriptions would be more expensive at standard pharmacies like [facility].  Plan  Continue current medication management strategy     Follow up: 6 month phone visit  Charlene Brooke, PharmD Clinical Pharmacist [facility] at [facility] [phone]

## 2019-06-17 NOTE — Patient Instructions (Signed)
Visit Information  Thank you for meeting with me to discuss your medications! I look forward to working with you to achieve your health care goals. Below is a summary of what we talked about during the visit:  Goals Addressed            This Visit's Progress   . Diabetes: A1c < 8%       CARE PLAN ENTRY (see longtitudinal plan of care for additional care plan information)  Current Barriers:  . Diabetes: uncontrolled; complicated by chronic medical conditions including CAD, HLD Lab Results  Component Value Date   HGBA1C 9.0 (H) 01/27/2019 .   Lab Results  Component Value Date   CREATININE 1.01 01/27/2019   CREATININE 0.85 04/28/2017   CREATININE 0.93 08/11/2016   . No results found for: EGFR . Current antihyperglycemic regimen: metformin 100 mg BID, Jardiance 25 mg daily . Denies hypoglycemic symptoms, including dizziness, lightheadedness, shaking, sweating . Denies hyperglycemic symptoms, including polyuria, polydipsia, polyphagia, nocturia, blurred vision, neuropathy . Current exercise: little to none . Current blood glucose readings: not checking . Cardiovascular risk reduction: o Current hypertensive regimen: metoprolol succinate 50 mg daily o Current hyperlipidemia regimen: simvastatin 20 mg HS o Current antiplatelet regimen: aspirin 81 mg  Pharmacist Clinical Goal(s):  Marland Kitchen Over the next 180 days, patient will work with PharmD and primary care provider to address elevated A1c . A1c goal < 8.0%  Interventions: . Comprehensive medication review performed, medication list updated in electronic medical record . Discussed diabetic diet at length, including plate method o If unable to reduce A1c through diet alone, we will consider switching Jardiance to Rybelsus for enhanced A1c reduction and weight loss  Patient Self Care Activities:  . Patient will check blood glucose several times per week at different times , document, and provide at future appointments . Patient will  take medications as prescribed . Patient will report any questions or concerns to provider   Initial goal documentation        Mr. Horn was given information about Chronic Care Management services today including:  1. CCM service includes personalized support from designated clinical staff supervised by his physician, including individualized plan of care and coordination with other care providers 2. 24/7 contact phone numbers for assistance for urgent and routine care needs. 3. Standard insurance, coinsurance, copays and deductibles apply for chronic care management only during months in which we provide at least 20 minutes of these services. Most insurances cover these services at 100%, however patients may be responsible for any copay, coinsurance and/or deductible if applicable. This service may help you avoid the need for more expensive face-to-face services. 4. Only one practitioner may furnish and bill the service in a calendar month. 5. The patient may stop CCM services at any time (effective at the end of the month) by phone call to the office staff.  Patient agreed to services and verbal consent obtained.   The patient verbalized understanding of instructions provided today and agreed to receive a mailed copy of patient instruction and/or educational materials. Telephone follow up appointment with pharmacy team member scheduled for: 6 months  Charlene Brooke, PharmD Clinical Pharmacist [facility] at [facility] [phone]   Carbohydrate Counting for Diabetes Mellitus, Adult  Carbohydrate counting is a method of keeping track of how many carbohydrates you eat. Eating carbohydrates naturally increases the amount of sugar (glucose) in the blood. Counting how many carbohydrates you eat helps keep your blood glucose within normal limits, which  helps you manage your diabetes (diabetes mellitus). It is important to know how many carbohydrates you can safely have  in each meal. This is different for every person. A diet and nutrition specialist (registered dietitian) can help you make a meal plan and calculate how many carbohydrates you should have at each meal and snack. Carbohydrates are found in the following foods:  Grains, such as breads and cereals.  Dried beans and soy products.  Starchy vegetables, such as potatoes, peas, and corn.  Fruit and fruit juices.  Milk and yogurt.  Sweets and snack foods, such as cake, cookies, candy, chips, and soft drinks. How do I count carbohydrates? There are two ways to count carbohydrates in food. You can use either of the methods or a combination of both. Reading "Nutrition Facts" on packaged food The "Nutrition Facts" list is included on the labels of almost all packaged foods and beverages in the [place] It includes:  The serving size.  Information about nutrients in each serving, including the grams (g) of carbohydrate per serving. To use the "Nutrition Facts":  Decide how many servings you will have.  Multiply the number of servings by the number of carbohydrates per serving.  The resulting number is the total amount of carbohydrates that you will be having. Learning standard serving sizes of other foods When you eat carbohydrate foods that are not packaged or do not include "Nutrition Facts" on the label, you need to measure the servings in order to count the amount of carbohydrates:  Measure the foods that you will eat with a food scale or measuring cup, if needed.  Decide how many standard-size servings you will eat.  Multiply the number of servings by 15. Most carbohydrate-rich foods have about 15 g of carbohydrates per serving. ? For example, if you eat 8 oz (170 g) of strawberries, you will have eaten 2 servings and 30 g of carbohydrates (2 servings x 15 g = 30 g).  For foods that have more than one food mixed, such as soups and casseroles, you must count the carbohydrates in each food  that is included. The following list contains standard serving sizes of common carbohydrate-rich foods. Each of these servings has about 15 g of carbohydrates:   hamburger bun or  English muffin.   oz (15 mL) syrup.   oz (14 g) jelly.  1 slice of bread.  1 six-inch tortilla.  3 oz (85 g) cooked rice or pasta.  4 oz (113 g) cooked dried beans.  4 oz (113 g) starchy vegetable, such as peas, corn, or potatoes.  4 oz (113 g) hot cereal.  4 oz (113 g) mashed potatoes or  of a large baked potato.  4 oz (113 g) canned or frozen fruit.  4 oz (120 mL) fruit juice.  4-6 crackers.  6 chicken nuggets.  6 oz (170 g) unsweetened dry cereal.  6 oz (170 g) plain fat-free yogurt or yogurt sweetened with artificial sweeteners.  8 oz (240 mL) milk.  8 oz (170 g) fresh fruit or one small piece of fruit.  24 oz (680 g) popped popcorn. Example of carbohydrate counting Sample meal  3 oz (85 g) chicken breast.  6 oz (170 g) brown rice.  4 oz (113 g) corn.  8 oz (240 mL) milk.  8 oz (170 g) strawberries with sugar-free whipped topping. Carbohydrate calculation 1. Identify the foods that contain carbohydrates: ? Rice. ? Corn. ? Milk. ? Strawberries. 2. Calculate how many servings you  have of each food: ? 2 servings rice. ? 1 serving corn. ? 1 serving milk. ? 1 serving strawberries. 3. Multiply each number of servings by 15 g: ? 2 servings rice x 15 g = 30 g. ? 1 serving corn x 15 g = 15 g. ? 1 serving milk x 15 g = 15 g. ? 1 serving strawberries x 15 g = 15 g. 4. Add together all of the amounts to find the total grams of carbohydrates eaten: ? 30 g + 15 g + 15 g + 15 g = 75 g of carbohydrates total. Summary  Carbohydrate counting is a method of keeping track of how many carbohydrates you eat.  Eating carbohydrates naturally increases the amount of sugar (glucose) in the blood.  Counting how many carbohydrates you eat helps keep your blood glucose within normal  limits, which helps you manage your diabetes.  A diet and nutrition specialist (registered dietitian) can help you make a meal plan and calculate how many carbohydrates you should have at each meal and snack. This information is not intended to replace advice given to you by your health care provider. Make sure you discuss any questions you have with your health care provider. Document Revised: 09/18/2016 Document Reviewed: 08/08/2015 Elsevier Patient Education  [place].

## 2019-07-10 DIAGNOSIS — Z23 Encounter for immunization: Secondary | ICD-10-CM | POA: Diagnosis not present

## 2019-12-07 ENCOUNTER — Telehealth: Payer: Self-pay | Admitting: Pharmacist

## 2019-12-07 NOTE — Progress Notes (Signed)
    Chronic Care Management Pharmacy Assistant   Name: Brandon Rubio.  MRN: [id number] DOB: 03/07/1951  Reason for Encounter: Chart Review   PCP : [name], MD  Allergies:   Allergies  Allergen Reactions  . Penicillins     REACTION: throat swelling Because of a history of documented adverse serious drug reaction;[name] Alert bracelet  is recommended    Medications: Outpatient Encounter Medications as of 12/07/2019  Medication Sig  . aspirin 81 MG tablet Take 81 mg by mouth daily.    . empagliflozin (JARDIANCE) 25 MG TABS tablet Take 25 mg by mouth daily.  Marland Kitchen glucose blood (FREESTYLE LITE) test strip USE TO TEST BLOOD SUGAR TWICE DAILY ICD E11.49 (Patient not taking: Reported on 06/16/2019)  . metFORMIN (GLUCOPHAGE) 1000 MG tablet Take 1 tablet (1,000 mg total) by mouth 2 (two) times daily with a meal.  . metoprolol succinate (TOPROL-XL) 50 MG 24 hr tablet Take 1 tablet (50 mg total) by mouth daily.  . Multiple Vitamin (MULTIVITAMIN) tablet Take 1 tablet by mouth daily.    Marland Kitchen omeprazole (PRILOSEC) 20 MG capsule Take 1 capsule (20 mg total) by mouth 2 (two) times daily before a meal.  . simvastatin (ZOCOR) 40 MG tablet Take 1/2 tablet by mouth every night at bedtime.   No facility-administered encounter medications on file as of 12/07/2019.    Current Diagnosis: Patient Active Problem List   Diagnosis Date Noted  . Routine general medical examination at a health care facility 08/13/2016  . GERD (gastroesophageal reflux disease) 09/05/2010  . Controlled diabetes mellitus with neurologic complication ([place]) 86/76/7209  . CIGARETTE SMOKER 09/25/2008  . SKIN CANCER, HX OF 09/25/2008  . Essential hypertension 03/20/2008  . CORONARY ARTERY DISEASE 03/20/2008  . Hyperlipidemia associated with type 2 diabetes mellitus ([place]) 12/23/2007    Goals Addressed   None     Follow-Up:  Pharmacist Review     Review the patients chart he is scheduled for a diabetic adherence  medication call for October.      Rosendo Gros, [facility]  Practice Team Manager/ CPA (Clinical Pharmacist Assistant) [phone]

## 2019-12-14 ENCOUNTER — Telehealth: Payer: Medicare Other

## 2019-12-20 ENCOUNTER — Telehealth: Payer: Self-pay | Admitting: Pharmacist

## 2019-12-20 NOTE — Progress Notes (Signed)
A user error has taken place: encounter opened in error, closed for administrative reasons.

## 2019-12-28 ENCOUNTER — Other Ambulatory Visit: Payer: Self-pay

## 2019-12-28 ENCOUNTER — Ambulatory Visit: Payer: Medicare Other | Admitting: Pharmacist

## 2019-12-28 DIAGNOSIS — E1365 Other specified diabetes mellitus with hyperglycemia: Secondary | ICD-10-CM

## 2019-12-28 DIAGNOSIS — I2510 Atherosclerotic heart disease of native coronary artery without angina pectoris: Secondary | ICD-10-CM

## 2019-12-28 DIAGNOSIS — IMO0002 Reserved for concepts with insufficient information to code with codable children: Secondary | ICD-10-CM

## 2019-12-28 DIAGNOSIS — I10 Essential (primary) hypertension: Secondary | ICD-10-CM

## 2019-12-28 DIAGNOSIS — E1169 Type 2 diabetes mellitus with other specified complication: Secondary | ICD-10-CM

## 2019-12-28 NOTE — Patient Instructions (Addendum)
Visit Information  Phone number for Pharmacist: [phone]  Goals Addressed            This Visit's Progress   . Pharmacy Care Plan       CARE PLAN ENTRY (see longitudinal plan of care for additional care plan information)  Current Barriers:  . Chronic Disease Management support, education, and care coordination needs related to Hypertension, Hyperlipidemia, Diabetes, and Coronary Artery Disease   Hypertension BP Readings from Last 3 Encounters:  01/27/19 120/80  11/18/17 110/60  09/02/17 (!) 132/58 .  Pharmacist Clinical Goal(s): o Over the next 180 days, patient will work with PharmD and providers to maintain BP goal <130/80 . Current regimen:  o Metoprolol succinate 50 mg daily . Interventions: o Discussed BP goals and benefits of medications for prevention of heart attack / stroke . Patient self care activities - Over the next 180 days, patient will: o Check BP weekly, document, and provide at future appointments o Ensure daily salt intake < 2300 mg/day  Hyperlipidemia / CAD Lab Results  Component Value Date/Time   LDLCALC 50 11/22/2012 10:24 AM   LDLDIRECT 87.0 01/27/2019 10:22 AM .  Pharmacist Clinical Goal(s): o Over the next 180 days, patient will work with PharmD and providers to achieve LDL goal < 70 . Current regimen:  o simvastatin 20 mg (1/2 of 40 mg) at bedtime o aspirin 81 mg daily . Interventions: o Discussed cholesterol goals and benefits of medications for prevention of heart attack / stroke . Patient self care activities - Over the next 180 days, patient will: o Continue current medications o   Diabetes Lab Results  Component Value Date/Time   HGBA1C 9.0 (H) 01/27/2019 10:22 AM   HGBA1C 7.8 (A) 11/18/2017 11:05 AM   HGBA1C 8.8 (H) 04/28/2017 08:52 AM .  Pharmacist Clinical Goal(s): o Over the next 180 days, patient will work with PharmD and providers to achieve A1c goal <8% . Current regimen:  o metformin 1000 mg twice a day o Jardiance  25 mg daily . Interventions: o Discussed A1c goals and benefits of medications for prevention of diabetic complications o Discussed patient's decision to make diet changes rather than add more medication Patient self care activities - Over the next 180 days, patient will: o Reduce portion sizes and carb intake o Increase exercise, ideally 30 minutes 5 days a week  Medication management . Pharmacist Clinical Goal(s): o Over the next 180 days, patient will work with PharmD and providers to maintain optimal medication adherence . Current pharmacy: [facility] . Interventions o Comprehensive medication review performed. o Continue current medication management strategy . Patient self care activities - Over the next 180 days, patient will: o Focus on medication adherence by fill date o Take medications as prescribed o Report any questions or concerns to PharmD and/or provider(s)  Please see past updates related to this goal by clicking on the "Past Updates" button in the selected goal       Patient verbalizes understanding of instructions provided today.  Telephone follow up appointment with pharmacy team member scheduled for: 6 months  Charlene Brooke, PharmD, BCACP Clinical Pharmacist [facility] at [facility] [phone]  MyPlate from [place] is an outline of a general healthy diet based on the 2010 Dietary Guidelines for Americans, from the U.S. Department of Agriculture Scientist, research (physical sciences)). It sets guidelines for how To follow MyPlate recommendations:  Eat a wide variety of fruits and vegetables, grains, and protein foods.  Serve smaller portions and eat  less food throughout the day.  Limit portion sizes to avoid overeating.  Enjoy your food.  Get at least 150 minutes of exercise every week. This is about 30 minutes each day, 5 or more days per week. It can be difficult to have every meal look like MyPlate. Think about MyPlate as eating guidelines for an entire day,  rather than each individual meal. Fruits and vegetables  Make half of your plate fruits and vegetables.  Eat many different colors of fruits and vegetables each day.  For a 2,000 calorie daily food plan, eat: ? 2 cups of vegetables every day. ? 2 cups of fruit every day.  1 cup is equal to: ? 1 cup raw or cooked vegetables. ? 1 cup raw fruit. ? 1 medium-sized orange, apple, or banana. ? 1 cup 100% fruit or vegetable juice. ? 2 cups raw leafy greens, such as lettuce, spinach, or kale. ?  cup dried fruit. Grains  One fourth of your plate should be grains.  Make at least half of the grains you eat each day whole grains.  For a 2,000 calorie daily food plan, eat 6 oz of grains every day.  1 oz is equal to: ? 1 slice bread. ? 1 cup cereal. ?  cup cooked rice, cereal, or pasta. Protein  One fourth of your plate should be protein.  Eat a wide variety of protein foods, including meat, poultry, fish, eggs, beans, nuts, and tofu.  For a 2,000 calorie daily food plan, eat 5 oz of protein every day.  1 oz is equal to: ? 1 oz meat, poultry, or fish. ?  cup cooked beans. ? 1 egg. ?  oz nuts or seeds. ? 1 Tbsp peanut butter. Dairy  Drink fat-free or low-fat (1%) milk.  Eat or drink dairy as a side to meals.  For a 2,000 calorie daily food plan, eat or drink 3 cups of dairy every day.  1 cup is equal to: ? 1 cup milk, yogurt, cottage cheese, or soy milk (soy beverage). ? 2 oz processed cheese. ? 1 oz natural cheese. Fats, oils, salt, and sugars  Only small amounts of oils are recommended.  Avoid foods that are high in calories and low in nutritional value (empty calories), like foods high in fat or added sugars.  Choose foods that are low in salt (sodium). Choose foods that have less than 140 milligrams (mg) of sodium per serving.  Drink water instead of sugary drinks. Drink enough water each day to keep your urine pale yellow. Where to find support  Work  with your health care provider or a nutrition specialist (dietitian) to develop a customized eating plan that is right for you.  Download an app (mobile application) to help you track your daily food intake. Where to find more information  Go to [email or web address] for more information. Summary  MyPlate is a general guideline for healthy eating from the USDA. It is based on the 2010 Dietary Guidelines for Americans.  In general, fruits and vegetables should take up  of your plate, grains should take up  of your plate, and protein should take up  of your plate. This information is not intended to replace advice given to you by your health care provider. Make sure you discuss any questions you have with your health care provider. Document Revised: 07/29/2018 Document Reviewed: 05/26/2016 Elsevier Patient Education  [place].

## 2019-12-28 NOTE — Chronic Care Management (AMB) (Signed)
Chronic Care Management Pharmacy  Name: Wheeler Incorvaia.  MRN: [id number] DOB: 02/27/1951   Chief Complaint/ HPI  Brandon Rubio.,  69 y.o. , male presents for their Follow-Up CCM visit with the clinical pharmacist via telephone due to COVID-19 Pandemic.  PCP : [name], MD  Their chronic conditions include: HTN, CAD, T2DM, HLD, GERD, tobacco use  Office Visits: 01/27/19 Dr [name]: counseled to quit smoking, not ready. LDL and A1c above goal, counseled to either make lifestyle changes or adjust medication, pt opted for lifestyle changes.  Consult Visit: n/a  Allergies  Allergen Reactions  . Penicillins     REACTION: throat swelling Because of a history of documented adverse serious drug reaction;[name] Alert bracelet  is recommended   Medications: Outpatient Encounter Medications as of 12/28/2019  Medication Sig  . aspirin 81 MG tablet Take 81 mg by mouth daily.    . empagliflozin (JARDIANCE) 25 MG TABS tablet Take 25 mg by mouth daily.  Marland Kitchen glucose blood (FREESTYLE LITE) test strip USE TO TEST BLOOD SUGAR TWICE DAILY ICD E11.49 (Patient not taking: Reported on 06/16/2019)  . metFORMIN (GLUCOPHAGE) 1000 MG tablet Take 1 tablet (1,000 mg total) by mouth 2 (two) times daily with a meal.  . metoprolol succinate (TOPROL-XL) 50 MG 24 hr tablet Take 1 tablet (50 mg total) by mouth daily.  . Multiple Vitamin (MULTIVITAMIN) tablet Take 1 tablet by mouth daily.    Marland Kitchen omeprazole (PRILOSEC) 20 MG capsule Take 1 capsule (20 mg total) by mouth 2 (two) times daily before a meal.  . simvastatin (ZOCOR) 40 MG tablet Take 1/2 tablet by mouth every night at bedtime.   No facility-administered encounter medications on file as of 12/28/2019.   Wt Readings from Last 3 Encounters:  01/27/19 227 lb (103 kg)  11/18/17 229 lb (103.9 kg)  09/02/17 223 lb (101.2 kg)   Current Diagnosis/Assessment:   Goals Addressed   None     Hypertension   BP goal < 130/80  Office  blood pressures are  BP Readings from Last 3 Encounters:  01/27/19 120/80  11/18/17 110/60  09/02/17 (!) 132/58   Patient checks BP at home never  Patient home BP readings are ranging: n/a  Patient has failed these meds in the past: n/a Patient is currently controlled on the following medications:   metoprolol succinate 50 mg daily  We discussed diet and exercise extensively; BP goals; benefits if medication   Plan  Continue current medications and control with diet and exercise   Hyperlipidemia / CAD   LDL goal < 70 CAD: NSTEMI 2009, medically managed, no stents.  Lipid Panel     Component Value Date/Time   CHOL 172 01/27/2019 1022   TRIG 370.0 (H) 01/27/2019 1022   HDL 35.50 (L) 01/27/2019 1022   CHOLHDL 5 01/27/2019 1022   VLDL 74.0 (H) 01/27/2019 1022   LDLCALC 50 11/22/2012 1024   LDLDIRECT 87.0 01/27/2019 1022   Hepatic Function Latest Ref Rng & Units 01/27/2019 04/28/2017 08/11/2016  Total Protein 6.0 - 8.3 g/dL 7.6 6.9 7.3  Albumin 3.5 - 5.2 g/dL 4.6 4.1 4.6  AST 0 - 37 U/L '13 14 14  ' ALT 0 - 53 U/L '19 20 18  ' Alk Phosphatase 39 - 117 U/L 63 57 65  Total Bilirubin 0.2 - 1.2 mg/dL 0.5 0.4 0.4  Bilirubin, Direct 0.0 - 0.3 mg/dL - - -   Patient has failed these meds in past: n/a Patient is currently controlled on  the following medications:   simvastatin 20 mg (1/2 of 40 mg) HS   aspirin 81 mg daily  We discussed:  diet and exercise extensively; Cholesterol goals; benefits of statin for ASCVD risk reduction; last PCP visit pt decided to make diet/lifestyle changes rather than medication changes to improve cholesterol; however today pt admits his diet has not changed much since last year; reiterated importance of diet/exercise to achieve cholesterol goals and prevent recurrent ASCVD  Plan  Continue current medications and control with diet and exercise   Diabetes   A1c goal < 8%  Recent Relevant Labs: Lab Results  Component Value Date/Time   HGBA1C 9.0  (H) 01/27/2019 10:22 AM   HGBA1C 7.8 (A) 11/18/2017 11:05 AM   HGBA1C 8.8 (H) 04/28/2017 08:52 AM   MICROALBUR 0.8 08/11/2016 09:55 AM   MICROALBUR 1.9 06/22/2014 08:10 AM    Last diabetic Foot exam: No results found for: HMDIABFOOTEX   Checking BG: Never  Patient has failed these meds in past: n/a Patient is currently uncontrolled on the following medications:   metformin 1000 mg BID  Jardiance 25 mg daily  We discussed: diet and exercise extensively; A1c as a measure of blood sugar over 2-3 months; pt had intended to adjust diet and exercise to lower blood sugar but admits his diet has not changed; recommended pt make an effort to change diet, increase exercise over next 2 months prior to PCP visit; if A1c remains high we will have to add another medication to improve A1c and prevent DM complicatitons  Plan  Continue current medications and control with diet and exercise  Reduce carbohydrates and portion sizes   Tobacco Use   Tobacco Status:  Social History   Tobacco Use  Smoking Status Smoker, Current Status Unknown  . Packs/day: 1.50  Smokeless Tobacco Never Used  Tobacco Comment   smoked 1964- present, up to 2 ppd. 09/22/12 : 1.5 ppd   Patient has failed these meds in past: none Previous quit attempts: Quit for 8-9 months about 20 yrs ago, cold [place]  We discussed:  smoking cessation, benefits of using NRT or Chantix, bupropion for cravings. Answered questions about side effects of Chantix.   Plan  Patient will let me know if he gets more serious about quitting.  Vaccines   Reviewed and discussed patient's vaccination history.    Immunization History  Administered Date(s) Administered  . Influenza, High Dose Seasonal PF 11/18/2017  . Influenza,inj,Quad PF,6+ Mos 05/11/2015  . Influenza-Unspecified 01/02/2013, 01/03/2014, 12/23/2018  . Pneumococcal Conjugate-13 09/02/2017  . Tdap 11/18/2017  . Zoster 01/03/2014   Pt reports he received both COVID  vaccines, he does not remember the dates but he will find his card and bring to next appointment to update chart.  Plan  Recommended patient receive influenza and COVID booster vaccine  Medication Management   Pt uses [facility] for all medications Does not use pill box Pt endorses compliance  We discussed: Pt is happy with [facility], prescriptions would be more expensive at standard pharmacies like [facility].  Plan  Continue current medication management strategy     Follow up: 6 month phone visit  Charlene Brooke, PharmD, BCACP Clinical Pharmacist [facility] at [facility] [phone]

## 2020-02-21 DIAGNOSIS — Z23 Encounter for immunization: Secondary | ICD-10-CM | POA: Diagnosis not present

## 2020-03-12 ENCOUNTER — Telehealth: Payer: Self-pay | Admitting: Pharmacist

## 2020-03-12 ENCOUNTER — Other Ambulatory Visit: Payer: Self-pay | Admitting: Internal Medicine

## 2020-03-12 NOTE — Progress Notes (Signed)
Chronic Care Management Pharmacy Assistant   Name: Brandon Rubio.  MRN: [id number] DOB: 1950/04/20  Reason for Encounter: Diabetic Adherence Call   PCP : [name], MD  Allergies:   Allergies  Allergen Reactions  . Penicillins     REACTION: throat swelling Because of a history of documented adverse serious drug reaction;[name] Alert bracelet  is recommended    Medications: Outpatient Encounter Medications as of 03/12/2020  Medication Sig  . aspirin 81 MG tablet Take 81 mg by mouth daily.    . empagliflozin (JARDIANCE) 25 MG TABS tablet Take 25 mg by mouth daily.  Marland Kitchen glucose blood (FREESTYLE LITE) test strip USE TO TEST BLOOD SUGAR TWICE DAILY ICD E11.49 (Patient not taking: Reported on 06/16/2019)  . metFORMIN (GLUCOPHAGE) 1000 MG tablet Take 1 tablet (1,000 mg total) by mouth 2 (two) times daily with a meal.  . metoprolol succinate (TOPROL-XL) 50 MG 24 hr tablet Take 1 tablet (50 mg total) by mouth daily.  . Multiple Vitamin (MULTIVITAMIN) tablet Take 1 tablet by mouth daily.    Marland Kitchen omeprazole (PRILOSEC) 20 MG capsule Take 1 capsule (20 mg total) by mouth 2 (two) times daily before a meal.  . simvastatin (ZOCOR) 40 MG tablet Take 1/2 tablet by mouth every night at bedtime.   No facility-administered encounter medications on file as of 03/12/2020.    Current Diagnosis: Patient Active Problem List   Diagnosis Date Noted  . Routine general medical examination at a health care facility 08/13/2016  . GERD (gastroesophageal reflux disease) 09/05/2010  . Controlled diabetes mellitus with neurologic complication ([place]) 62/95/2841  . CIGARETTE SMOKER 09/25/2008  . SKIN CANCER, HX OF 09/25/2008  . Essential hypertension 03/20/2008  . CAD (coronary artery disease) 03/20/2008  . Hyperlipidemia associated with type 2 diabetes mellitus ([place]) 12/23/2007    Goals Addressed   None     Follow-Up:  Pharmacist Review   Recent Relevant Labs: Lab Results  Component Value  Date/Time   HGBA1C 9.0 (H) 01/27/2019 10:22 AM   HGBA1C 7.8 (A) 11/18/2017 11:05 AM   HGBA1C 8.8 (H) 04/28/2017 08:52 AM   MICROALBUR 0.8 08/11/2016 09:55 AM   MICROALBUR 1.9 06/22/2014 08:10 AM    Kidney Function Lab Results  Component Value Date/Time   CREATININE 1.01 01/27/2019 10:22 AM   CREATININE 0.85 04/28/2017 08:52 AM   GFR 72.86 01/27/2019 10:22 AM   GFRNONAA >60 12/14/2007 04:40 AM   GFRAA  12/14/2007 04:40 AM    >60        The eGFR has been calculated using the MDRD equation. This calculation has not been validated in all clinical    . Current antihyperglycemic regimen: The patient is taking Metformin and Jardiance for his daily regime  . What recent interventions/DTPs have been made to improve glycemic control: The patient states that he is suppose to watch what he eats and try to exercise   . Have there been any recent hospitalizations or ED visits since last visit with CPP? The patient states that he has not been to the hospital or ED recently  . Patient denies hypoglycemic symptoms . Patient denies hyperglycemic symptoms  . How often are you checking your blood sugar? The patient states that he does check his blood sugar but not every day  . What are your blood sugars ranging? The patient did not having any reading just stated that his readings are normal o Fasting:  o Before meals:  o After meals:  o Bedtime:   .  During the week, how often does your blood glucose drop below 70? The patient states that hehas not had any readings below 70  . Are you checking your feet daily/regularly? The patient states that he does check his feet for any infections, swelling and blisters but feet are fine  Adherence Review: Is the patient currently on a STATIN medication? Yes, Simvastatin Is the patient currently on ACE/ARB medication? No Does the patient have >5 day gap between last estimated fill dates? No   Wendy Poet, Clinical Pharmacist Assistant [facility]

## 2020-04-19 ENCOUNTER — Ambulatory Visit: Payer: Medicare Other | Admitting: Internal Medicine

## 2020-04-30 ENCOUNTER — Other Ambulatory Visit: Payer: Self-pay | Admitting: Internal Medicine

## 2020-06-07 ENCOUNTER — Other Ambulatory Visit: Payer: Self-pay | Admitting: Internal Medicine

## 2020-06-08 ENCOUNTER — Other Ambulatory Visit: Payer: Self-pay | Admitting: Internal Medicine

## 2020-06-14 ENCOUNTER — Encounter: Payer: Self-pay | Admitting: Internal Medicine

## 2020-06-14 ENCOUNTER — Other Ambulatory Visit: Payer: Self-pay

## 2020-06-14 ENCOUNTER — Ambulatory Visit (INDEPENDENT_AMBULATORY_CARE_PROVIDER_SITE_OTHER): Payer: Medicare Other | Admitting: Internal Medicine

## 2020-06-14 VITALS — BP 132/76 | HR 79 | Temp 98.6°F | Resp 18 | Ht 72.0 in | Wt 221.4 lb

## 2020-06-14 DIAGNOSIS — E1165 Type 2 diabetes mellitus with hyperglycemia: Secondary | ICD-10-CM | POA: Diagnosis not present

## 2020-06-14 DIAGNOSIS — E785 Hyperlipidemia, unspecified: Secondary | ICD-10-CM | POA: Diagnosis not present

## 2020-06-14 DIAGNOSIS — I10 Essential (primary) hypertension: Secondary | ICD-10-CM | POA: Diagnosis not present

## 2020-06-14 DIAGNOSIS — J410 Simple chronic bronchitis: Secondary | ICD-10-CM | POA: Diagnosis not present

## 2020-06-14 DIAGNOSIS — IMO0002 Reserved for concepts with insufficient information to code with codable children: Secondary | ICD-10-CM

## 2020-06-14 DIAGNOSIS — E1169 Type 2 diabetes mellitus with other specified complication: Secondary | ICD-10-CM

## 2020-06-14 DIAGNOSIS — E118 Type 2 diabetes mellitus with unspecified complications: Secondary | ICD-10-CM

## 2020-06-14 DIAGNOSIS — E1142 Type 2 diabetes mellitus with diabetic polyneuropathy: Secondary | ICD-10-CM

## 2020-06-14 DIAGNOSIS — Z800 Family history of malignant neoplasm of digestive organs: Secondary | ICD-10-CM | POA: Diagnosis not present

## 2020-06-14 DIAGNOSIS — Z0000 Encounter for general adult medical examination without abnormal findings: Secondary | ICD-10-CM

## 2020-06-14 LAB — MICROALBUMIN / CREATININE URINE RATIO
Creatinine,U: 60.6 mg/dL
Microalb Creat Ratio: 2.3 mg/g (ref 0.0–30.0)
Microalb, Ur: 1.4 mg/dL (ref 0.0–1.9)

## 2020-06-14 LAB — COMPREHENSIVE METABOLIC PANEL
ALT: 21 U/L (ref 0–53)
AST: 16 U/L (ref 0–37)
Albumin: 4.6 g/dL (ref 3.5–5.2)
Alkaline Phosphatase: 62 U/L (ref 39–117)
BUN: 13 mg/dL (ref 6–23)
CO2: 29 mEq/L (ref 19–32)
Calcium: 9.3 mg/dL (ref 8.4–10.5)
Chloride: 103 mEq/L (ref 96–112)
Creatinine, Ser: 0.96 mg/dL (ref 0.40–1.50)
GFR: 79.15 mL/min (ref >60.00)
Glucose, Bld: 143 mg/dL — ABNORMAL HIGH (ref 70–99)
Potassium: 4.5 mEq/L (ref 3.5–5.1)
Sodium: 140 mEq/L (ref 135–145)
Total Bilirubin: 0.4 mg/dL (ref 0.2–1.2)
Total Protein: 7.1 g/dL (ref 6.0–8.3)

## 2020-06-14 LAB — CBC
HCT: 45.9 % (ref 39.0–52.0)
Hemoglobin: 15.7 g/dL (ref 13.0–17.0)
MCHC: 34.3 g/dL (ref 30.0–36.0)
MCV: 88.5 fl (ref 78.0–100.0)
Platelets: 213 10*3/uL (ref 150.0–400.0)
RBC: 5.18 Mil/uL (ref 4.22–5.81)
RDW: 13.4 % (ref 11.5–15.5)
WBC: 8.1 10*3/uL (ref 4.0–10.5)

## 2020-06-14 LAB — LIPID PANEL
Cholesterol: 144 mg/dL (ref 0–200)
HDL: 36.1 mg/dL — ABNORMAL LOW (ref >39.00)
NonHDL: 107.67
Total CHOL/HDL Ratio: 4
Triglycerides: 254 mg/dL — ABNORMAL HIGH (ref 0.0–149.0)
VLDL: 50.8 mg/dL — ABNORMAL HIGH (ref 0.0–40.0)

## 2020-06-14 LAB — LDL CHOLESTEROL, DIRECT: Direct LDL: 76 mg/dL

## 2020-06-14 LAB — HEMOGLOBIN A1C: Hgb A1c MFr Bld: 8.7 % — ABNORMAL HIGH (ref 4.6–6.5)

## 2020-06-14 NOTE — Progress Notes (Signed)
Subjective:   Patient ID: Brandon Rubio., male    DOB: [birth year], 70 y.o.   MRN: [id number]  HPI Here for medicare wellness, no new complaints. Please see A/P for status and treatment of chronic medical problems.   HPI #2: Here for follow up diabetes (taking jardiance and metformin currently, last HgA1c above goal, no follow up in about 1-2 years), and cigarette smoking (smoking 1.5 PPD or more, chronic cough denies SOB, not sure he can make attempt to stop smoking at this time) and hyperlipidemia (taking simvastatin, denies side effects, denies missing lately, no labs recently, no chest pains or stroke symptoms)  Diet: DM since diabetic Physical activity: sedentary Depression/mood screen: negative Hearing: intact to whispered voice, mild loss bilaterally Visual acuity: grossly normal with reading glasses, performs annual eye exam  ADLs: capable Fall risk: none Home safety: good Cognitive evaluation: intact to orientation, naming, recall and repetition EOL planning: adv directives discussed  [place] Visit from 06/14/2020 in [place] [facility] Total Score 0      I have personally reviewed and have noted 1. The patient's medical and social history - reviewed today no changes 2. Their use of alcohol, tobacco or illicit drugs 3. Their current medications and supplements 4. The patient's functional ability including ADL's, fall risks, home safety risks and hearing or visual impairment. 5. Diet and physical activities 6. Evidence for depression or mood disorders 7. Care team reviewed and updated 8.  The patient is not on an opioid pain medication.   Patient Care Team: [name], MD as PCP - General (Internal Medicine) Milus Banister, MD as Attending Physician (Gastroenterology) Charlton Haws, [facility] as Pharmacist (Pharmacist) Past Medical History:  Diagnosis Date  . CAD (coronary artery disease)   . Cancer ([place])     SKIN.[name]  . Cataract   . Diabetes mellitus    2  . GERD (gastroesophageal reflux disease)   . Hyperlipidemia   . Hypertension   . Myocardial infarct, old    2003  . Peripheral neuropathy    hands   Past Surgical History:  Procedure Laterality Date  . COLONOSCOPY W/ POLYPECTOMY  2011  . LARYNGOSCOPY  1999   Family History  Problem Relation Age of Onset  . Colon polyps Father   . Colon cancer Father 30  . Coronary artery disease Father   . Lung disease Father   . Lung cancer Mother        X 2    Review of Systems  Constitutional: Negative.   HENT: Negative.   Eyes: Negative.   Respiratory: Negative for cough, chest tightness and shortness of breath.   Cardiovascular: Negative for chest pain, palpitations and leg swelling.  Gastrointestinal: Negative for abdominal distention, abdominal pain, constipation, diarrhea, nausea and vomiting.  Musculoskeletal: Negative.   Skin: Negative.   Neurological: Negative.   Psychiatric/Behavioral: Negative.     Objective:  Physical Exam Constitutional:      Appearance: He is well-developed.  HENT:     Head: Normocephalic and atraumatic.  Cardiovascular:     Rate and Rhythm: Normal rate and regular rhythm.  Pulmonary:     Effort: Pulmonary effort is normal. No respiratory distress.     Breath sounds: Normal breath sounds. No wheezing or rales.  Abdominal:     General: Bowel sounds are normal. There is no distension.     Palpations: Abdomen is soft.     Tenderness: There is no  abdominal tenderness. There is no rebound.  Musculoskeletal:     Cervical back: Normal range of motion.  Skin:    General: Skin is warm and dry.     Comments: Foot exam done  Neurological:     Mental Status: He is alert and oriented to person, place, and time.     Coordination: Coordination normal.     Vitals:   06/14/20 0936  BP: 132/76  Pulse: 79  Resp: 18  Temp: 98.6 F (37 C)  TempSrc: Oral  SpO2: 93%  Weight: 221 lb 6.4 oz  (100.4 kg)  Height: 6' (1.829 m)   This visit occurred during the SARS-CoV-2 public health emergency.  Safety protocols were in place, including screening questions prior to the visit, additional usage of staff PPE, and extensive cleaning of exam room while observing appropriate contact time as indicated for disinfecting solutions.   Assessment & Plan:

## 2020-06-14 NOTE — Patient Instructions (Signed)
We will check the labs today and call you back about the results.   We will get you in for the colonoscopy.   Health Maintenance, Male Adopting a healthy lifestyle and getting preventive care are important in promoting health and wellness. Ask your health care provider about:  The right schedule for you to have regular tests and exams.  Things you can do on your own to prevent diseases and keep yourself healthy. What should I know about diet, weight, and exercise? Eat a healthy diet  Eat a diet that includes plenty of vegetables, fruits, low-fat dairy products, and lean protein.  Do not eat a lot of foods that are high in solid fats, added sugars, or sodium.   Maintain a healthy weight Body mass index (BMI) is a measurement that can be used to identify possible weight problems. It estimates body fat based on height and weight. Your health care provider can help determine your BMI and help you achieve or maintain a healthy weight. Get regular exercise Get regular exercise. This is one of the most important things you can do for your health. Most adults should:  Exercise for at least 150 minutes each week. The exercise should increase your heart rate and make you sweat (moderate-intensity exercise).  Do strengthening exercises at least twice a week. This is in addition to the moderate-intensity exercise.  Spend less time sitting. Even light physical activity can be beneficial. Watch cholesterol and blood lipids Have your blood tested for lipids and cholesterol at 72 years of age, then have this test every 5 years. You may need to have your cholesterol levels checked more often if:  Your lipid or cholesterol levels are high.  You are older than 72 years of age.  You are at high risk for heart disease. What should I know about cancer screening? Many types of cancers can be detected early and may often be prevented. Depending on your health history and family history, you may need to  have cancer screening at various ages. This may include screening for:  Colorectal cancer.  Prostate cancer.  Skin cancer.  Lung cancer. What should I know about heart disease, diabetes, and high blood pressure? Blood pressure and heart disease  High blood pressure causes heart disease and increases the risk of stroke. This is more likely to develop in people who have high blood pressure readings, are of African descent, or are overweight.  Talk with your health care provider about your target blood pressure readings.  Have your blood pressure checked: ? Every 3-5 years if you are 78-72 years of age. ? Every year if you are 83 years old or older.  If you are between the ages of 53 and 43 and are a current or former smoker, ask your health care provider if you should have a one-time screening for abdominal aortic aneurysm (AAA). Diabetes Have regular diabetes screenings. This checks your fasting blood sugar level. Have the screening done:  Once every three years after age 38 if you are at a normal weight and have a low risk for diabetes.  More often and at a younger age if you are overweight or have a high risk for diabetes. What should I know about preventing infection? Hepatitis B If you have a higher risk for hepatitis B, you should be screened for this virus. Talk with your health care provider to find out if you are at risk for hepatitis B infection. Hepatitis C Blood testing is recommended for:  Everyone born from [birth year] through 1967.  Anyone with known risk factors for hepatitis C. Sexually transmitted infections (STIs)  You should be screened each year for STIs, including gonorrhea and chlamydia, if: ? You are sexually active and are younger than 72 years of age. ? You are older than 72 years of age and your health care provider tells you that you are at risk for this type of infection. ? Your sexual activity has changed since you were last screened, and you are at  increased risk for chlamydia or gonorrhea. Ask your health care provider if you are at risk.  Ask your health care provider about whether you are at high risk for HIV. Your health care provider may recommend a prescription medicine to help prevent HIV infection. If you choose to take medicine to prevent HIV, you should first get tested for HIV. You should then be tested every 3 months for as long as you are taking the medicine. Follow these instructions at home: Lifestyle  Do not use any products that contain nicotine or tobacco, such as cigarettes, e-cigarettes, and chewing tobacco. If you need help quitting, ask your health care provider.  Do not use street drugs.  Do not share needles.  Ask your health care provider for help if you need support or information about quitting drugs. Alcohol use  Do not drink alcohol if your health care provider tells you not to drink.  If you drink alcohol: ? Limit how much you have to 0-2 drinks a day. ? Be aware of how much alcohol is in your drink. In the [place] one drink equals one 12 oz bottle of beer (355 mL), one 5 oz glass of wine (148 mL), or one 1 oz glass of hard liquor (44 mL). General instructions  Schedule regular health, dental, and eye exams.  Stay current with your vaccines.  Tell your health care provider if: ? You often feel depressed. ? You have ever been abused or do not feel safe at home. Summary  Adopting a healthy lifestyle and getting preventive care are important in promoting health and wellness.  Follow your health care provider's instructions about healthy diet, exercising, and getting tested or screened for diseases.  Follow your health care provider's instructions on monitoring your cholesterol and blood pressure. This information is not intended to replace advice given to you by your health care provider. Make sure you discuss any questions you have with your health care provider. Document Revised: 02/17/2018  Document Reviewed: 02/17/2018 Elsevier Patient Education  2021 [employer].

## 2020-06-15 ENCOUNTER — Encounter: Payer: Self-pay | Admitting: Internal Medicine

## 2020-06-15 NOTE — Assessment & Plan Note (Signed)
He is advised to quit and is unable to make attempt today. Reminded about high risk for further complications from smoking.

## 2020-06-15 NOTE — Assessment & Plan Note (Signed)
Complicated by stable neuropthy and hyperlipidemia. Checking HgA1c and microalbumin to creatinine ratio and lipid panel. Adjust metformin and jardiance. He is not on ACE-I/ARB but is on statin.

## 2020-06-15 NOTE — Assessment & Plan Note (Signed)
Prior lipid panel at goal. None in some time. Checking today, adjust simvastatin 20 mg daily as needed.

## 2020-06-15 NOTE — Assessment & Plan Note (Signed)
BP at goal on metoprolol. Checking CMP and adjust as needed.

## 2020-06-18 ENCOUNTER — Other Ambulatory Visit: Payer: Self-pay | Admitting: Internal Medicine

## 2020-06-26 ENCOUNTER — Other Ambulatory Visit: Payer: Self-pay | Admitting: Internal Medicine

## 2020-06-26 MED ORDER — SIMVASTATIN 40 MG PO TABS: 40.0000 mg | ORAL_TABLET | Freq: Every day | ORAL | 3 refills | Status: DC

## 2020-06-26 MED ORDER — METFORMIN HCL 1000 MG PO TABS: 1000.0000 mg | ORAL_TABLET | Freq: Two times a day (BID) | ORAL | 1 refills | Status: DC

## 2020-06-27 ENCOUNTER — Telehealth: Payer: Medicare Other

## 2020-06-28 DIAGNOSIS — E119 Type 2 diabetes mellitus without complications: Secondary | ICD-10-CM | POA: Diagnosis not present

## 2020-06-28 DIAGNOSIS — H353131 Nonexudative age-related macular degeneration, bilateral, early dry stage: Secondary | ICD-10-CM | POA: Diagnosis not present

## 2020-06-28 DIAGNOSIS — H2513 Age-related nuclear cataract, bilateral: Secondary | ICD-10-CM | POA: Diagnosis not present

## 2020-06-28 DIAGNOSIS — H40013 Open angle with borderline findings, low risk, bilateral: Secondary | ICD-10-CM | POA: Diagnosis not present

## 2020-06-28 LAB — HM DIABETES EYE EXAM

## 2020-07-19 ENCOUNTER — Other Ambulatory Visit: Payer: Self-pay

## 2020-07-19 ENCOUNTER — Ambulatory Visit (INDEPENDENT_AMBULATORY_CARE_PROVIDER_SITE_OTHER): Payer: Medicare Other | Admitting: Pharmacist

## 2020-07-19 DIAGNOSIS — E1169 Type 2 diabetes mellitus with other specified complication: Secondary | ICD-10-CM | POA: Diagnosis not present

## 2020-07-19 DIAGNOSIS — I10 Essential (primary) hypertension: Secondary | ICD-10-CM | POA: Diagnosis not present

## 2020-07-19 DIAGNOSIS — E785 Hyperlipidemia, unspecified: Secondary | ICD-10-CM | POA: Diagnosis not present

## 2020-07-19 DIAGNOSIS — IMO0002 Reserved for concepts with insufficient information to code with codable children: Secondary | ICD-10-CM

## 2020-07-19 DIAGNOSIS — I2510 Atherosclerotic heart disease of native coronary artery without angina pectoris: Secondary | ICD-10-CM | POA: Diagnosis not present

## 2020-07-19 DIAGNOSIS — E118 Type 2 diabetes mellitus with unspecified complications: Secondary | ICD-10-CM | POA: Diagnosis not present

## 2020-07-19 DIAGNOSIS — E1165 Type 2 diabetes mellitus with hyperglycemia: Secondary | ICD-10-CM | POA: Diagnosis not present

## 2020-07-19 DIAGNOSIS — F17200 Nicotine dependence, unspecified, uncomplicated: Secondary | ICD-10-CM

## 2020-07-19 MED ORDER — EMPAGLIFLOZIN 25 MG PO TABS: 25.0000 mg | ORAL_TABLET | Freq: Every day | ORAL | 0 refills | Status: DC

## 2020-07-19 NOTE — Patient Instructions (Signed)
Visit Information  Phone number for Pharmacist: [phone]  Goals Addressed            This Visit's Progress   . Monitor and Manage My Blood Sugar-Diabetes Type 2       Timeframe:  Long-Range Goal Priority:  High Start Date:        07/19/20                     Expected End Date:    02/06/21                   Follow Up Date 11/06/20    -Continue metformin and Jardiance as prescribed -Reduce sugar and carbs in diet -Increase exercise to goal of 150 min/week   Why is this important?    Checking your blood sugar at home helps to keep it from getting very high or very low.   Writing the results in a diary or log helps the doctor know how to care for you.   Your blood sugar log should have the time, date and the results.   Also, write down the amount of insulin or other medicine that you take.   Other information, like what you ate, exercise done and how you were feeling, will also be helpful.     Notes:       The patient verbalized understanding of instructions, educational materials, and care plan provided today and declined offer to receive copy of patient instructions, educational materials, and care plan.  Telephone follow up appointment with pharmacy team member scheduled for: 3 months  Charlene Brooke, PharmD, [place], CPP Clinical Pharmacist [facility] at [facility] [phone]

## 2020-07-19 NOTE — Progress Notes (Signed)
Chronic Care Management Pharmacy Note  07/19/2020 Name:  Brandon Rubio. MRN:  [id number] DOB:  1950/12/12  Subjective: Brandon Rubio. is an 70 y.o. year old male who is a primary patient of [name], MD.  The CCM team was consulted for assistance with disease management and care coordination needs.    Engaged with patient by telephone for follow up visit in response to provider referral for pharmacy case management and/or care coordination services.   Consent to Services:  The patient was given information about Chronic Care Management services, agreed to services, and gave verbal consent prior to initiation of services.  Please see initial visit note for detailed documentation.   Patient Care Team: [name], MD as PCP - General (Internal Medicine) Milus Banister, MD as Attending Physician (Gastroenterology) Charlton Haws, [facility] as Pharmacist (Pharmacist)  Recent office visits: 06/14/20 Dr [name] OV: chronic f/u. A1c 8.7. Continue metformin 1000 mg BID, increase simvastatin to 40 mg (whole tablet)  Recent consult visits: none  Hospital visits: None in previous 6 months  Objective:  Lab Results  Component Value Date   CREATININE 0.96 06/14/2020   BUN 13 06/14/2020   GFR 79.15 06/14/2020   GFRNONAA >60 12/14/2007   GFRAA  12/14/2007    >60        The eGFR has been calculated using the MDRD equation. This calculation has not been validated in all clinical   NA 140 06/14/2020   K 4.5 06/14/2020   CALCIUM 9.3 06/14/2020   CO2 29 06/14/2020   GLUCOSE 143 (H) 06/14/2020    Lab Results  Component Value Date/Time   HGBA1C 8.7 (H) 06/14/2020 10:16 AM   HGBA1C 9.0 (H) 01/27/2019 10:22 AM   GFR 79.15 06/14/2020 10:16 AM   GFR 72.86 01/27/2019 10:22 AM   MICROALBUR 1.4 06/14/2020 10:16 AM   MICROALBUR 0.8 08/11/2016 09:55 AM    Last diabetic Eye exam:  Lab Results  Component Value Date/Time   HMDIABEYEEXA No Retinopathy  05/25/2017 12:00 AM    Last diabetic Foot exam: No results found for: HMDIABFOOTEX   Lab Results  Component Value Date   CHOL 144 06/14/2020   HDL 36.10 (L) 06/14/2020   LDLCALC 50 11/22/2012   LDLDIRECT 76.0 06/14/2020   TRIG 254.0 (H) 06/14/2020   CHOLHDL 4 06/14/2020    Hepatic Function Latest Ref Rng & Units 06/14/2020 01/27/2019 04/28/2017  Total Protein 6.0 - 8.3 g/dL 7.1 7.6 6.9  Albumin 3.5 - 5.2 g/dL 4.6 4.6 4.1  AST 0 - 37 U/L '16 13 14  ' ALT 0 - 53 U/L '21 19 20  ' Alk Phosphatase 39 - 117 U/L 62 63 57  Total Bilirubin 0.2 - 1.2 mg/dL 0.4 0.5 0.4  Bilirubin, Direct 0.0 - 0.3 mg/dL - - -    Lab Results  Component Value Date/Time   TSH 1.22 06/22/2014 08:10 AM   TSH 1.14 08/24/2012 10:03 AM    CBC Latest Ref Rng & Units 06/14/2020 01/27/2019 08/11/2016  WBC 4.0 - 10.5 K/uL 8.1 8.7 8.9  Hemoglobin 13.0 - 17.0 g/dL 15.7 16.1 15.9  Hematocrit 39.0 - 52.0 % 45.9 47.3 45.8  Platelets 150.0 - 400.0 K/uL 213.0 224.0 214.0    No results found for: VD25OH  Clinical ASCVD: Yes  The 10-year ASCVD risk score Mikey Bussing DC Jr., et al., 2013) is: 47.3%   Values used to calculate the score:     Age: 29 years     Sex: Male  Is Non-Hispanic African American: No     Diabetic: Yes     Tobacco smoker: Yes     Systolic Blood Pressure: 588 mmHg     Is BP treated: Yes     HDL Cholesterol: 36.1 mg/dL     Total Cholesterol: 144 mg/dL    Depression screen [place] 2/9 06/14/2020 01/27/2019 09/02/2017  Decreased Interest 0 0 0  Down, Depressed, Hopeless 0 0 0  PHQ - 2 Score 0 0 0     Social History   Tobacco Use  Smoking Status Current Every Day Smoker  . Packs/day: 1.50  Smokeless Tobacco Never Used  Tobacco Comment   smoked 1964- present, up to 2 ppd. 09/22/12 : 1.5 ppd   BP Readings from Last 3 Encounters:  06/14/20 132/76  01/27/19 120/80  11/18/17 110/60   Pulse Readings from Last 3 Encounters:  06/14/20 79  01/27/19 73  11/18/17 78   Wt Readings from Last 3 Encounters:   06/14/20 221 lb 6.4 oz (100.4 kg)  01/27/19 227 lb (103 kg)  11/18/17 229 lb (103.9 kg)   BMI Readings from Last 3 Encounters:  06/14/20 30.03 kg/m  01/27/19 30.79 kg/m  11/18/17 31.06 kg/m    Assessment/Interventions: Review of patient past medical history, allergies, medications, health status, including review of consultants reports, laboratory and other test data, was performed as part of comprehensive evaluation and provision of chronic care management services.   SDOH:  (Social Determinants of Health) assessments and interventions performed: Yes  SDOH Screenings   Alcohol Screen: Not on file  Depression (PHQ2-9): Low Risk   . PHQ-2 Score: 0  Financial Resource Strain: Not on file  Food Insecurity: Not on file  Housing: Not on file  Physical Activity: Not on file  Social Connections: Not on file  Stress: Not on file  Tobacco Use: High Risk  . Smoking Tobacco Use: Current Every Day Smoker  . Smokeless Tobacco Use: Never Used  Transportation Needs: Not on file    CCM Care Plan  Allergies  Allergen Reactions  . Penicillins     REACTION: throat swelling Because of a history of documented adverse serious drug reaction;[name] Alert bracelet  is recommended    Medications Reviewed Today    Reviewed by Charlton Haws, [facility] (Pharmacist) on 07/19/20 at 1427  Med List Status: <None>  Medication Order Taking? Sig Documenting Provider Last Dose Status Informant  aspirin 81 MG tablet [id number] Yes Take 81 mg by mouth daily. [provider] Taking Active   empagliflozin (JARDIANCE) 25 MG TABS tablet [id number] Yes Take 1 tablet (25 mg total) by mouth daily. [name], MD Taking Active   glucose blood (FREESTYLE LITE) test strip [id number] Yes USE TO TEST BLOOD SUGAR TWICE DAILY ICD E11.49 Hendricks Limes, MD Taking Active   metFORMIN (GLUCOPHAGE) 1000 MG tablet [id number] Yes Take 1 tablet (1,000 mg total) by mouth 2 (two) times daily with a meal.  [name], MD Taking Active   metoprolol succinate (TOPROL-XL) 50 MG 24 hr tablet [id number] Yes TAKE 1 TABLET(50 MG) BY MOUTH DAILY [name], MD Taking Active   Multiple Vitamin (MULTIVITAMIN) tablet [id number] Yes Take 1 tablet by mouth daily. [provider] Taking Active   omeprazole (PRILOSEC) 20 MG capsule [id number] Yes Take 1 capsule (20 mg total) by mouth 2 (two) times daily before a meal. Please call our office to schedule a follow up to receive additional refills. [name], MD Taking Active  simvastatin (ZOCOR) 40 MG tablet [id number] Yes Take 1 tablet (40 mg total) by mouth daily at 6 PM. [name], MD Taking Active           Patient Active Problem List   Diagnosis Date Noted  . Routine general medical examination at a health care facility 08/13/2016  . GERD (gastroesophageal reflux disease) 09/05/2010  . Uncontrolled diabetes mellitus with complications ([place]) 72/53/6644  . Smokers' cough ([place]) 09/25/2008  . SKIN CANCER, HX OF 09/25/2008  . Essential hypertension 03/20/2008  . CAD (coronary artery disease) 03/20/2008  . Hyperlipidemia associated with type 2 diabetes mellitus ([place]) 12/23/2007    Immunization History  Administered Date(s) Administered  . Influenza, High Dose Seasonal PF 11/18/2017, 11/23/2018  . Influenza,inj,Quad PF,6+ Mos 05/11/2015  . Influenza-Unspecified 01/02/2013, 01/03/2014, 12/23/2018  . PFIZER(Purple Top)SARS-COV-2 Vaccination 06/14/2019, 07/10/2019, 02/21/2020  . Pneumococcal Conjugate-13 09/02/2017  . Tdap 11/18/2017  . Zoster 01/03/2014    Conditions to be addressed/monitored:  Hypertension, Hyperlipidemia, Diabetes, Coronary Artery Disease and Tobacco use  Care Plan : [place]  Updates made by Charlton Haws, [place] since 07/19/2020 12:00 AM    Problem: Hypertension, Hyperlipidemia, Diabetes, Coronary Artery Disease and Tobacco use   Priority: High    Long-Range  Goal: Disease management   Start Date: 07/19/2020  Expected End Date: 01/19/2021  This Visit's Progress: On track  Priority: High  Note:   Current Barriers:  . Unable to independently monitor therapeutic efficacy . Unable to achieve control of diabetes   Pharmacist Clinical Goal(s):  Marland Kitchen Patient will achieve adherence to monitoring guidelines and medication adherence to achieve therapeutic efficacy . achieve control of diabetes as evidenced by improved A1c < 8% through collaboration with PharmD and provider.   Interventions: . 1:1 collaboration with [name], MD regarding development and update of comprehensive plan of care as evidenced by provider attestation and co-signature . Inter-disciplinary care team collaboration (see longitudinal plan of care) . Comprehensive medication review performed; medication list updated in electronic medical record  Hypertension    BP goal < 130/80 Patient checks BP at home never Patient home BP readings are ranging: n/a   Patient has failed these meds in the past: n/a Patient is currently controlled on the following medications:   metoprolol succinate 50 mg daily   We discussed:  BP goals; benefits if medication     Plan: Continue current medications and control with diet and exercise    Hyperlipidemia / CAD    LDL goal < 70 CAD: NSTEMI 2009, medically managed, no stents.  Patient has failed these meds in past: n/a Patient is currently controlled on the following medications:   simvastatin 40 mg HS  aspirin 81 mg daily   We discussed:  diet and exercise extensively; Cholesterol goals; benefits of statin for ASCVD risk reduction; last PCP visit pt decided to make diet/lifestyle changes rather than medication changes to improve cholesterol; however today pt admits his diet has not changed much since last year; reiterated importance of diet/exercise to achieve cholesterol goals and prevent recurrent ASCVD   Plan Continue  current medications and control with diet and exercise    Diabetes    A1c goal < 8% Checking BG: Never   Patient has failed these meds in past: n/a Patient is currently uncontrolled on the following medications:   metformin 1000 mg BID  Jardiance 25 mg daily   We discussed: pt saw PCP last month and gave the impression he had stopped  taking Jardiance - this is not the case, he just did not want to increase the dose; counseled patient that he is currently taking highest doses of metformin and Jardiance; he reports he has made a "drastic" change in his diet to limit sugars and declines to add a new med until A1c is rechecked in July; reiterated A1c goal and benefits of exercise   Plan Continue current medications  Increase exercise - target 150 min/week Reduce sugar/carbs in diet   Tobacco Use    Patient has failed these meds in past: none Previous quit attempts: Quit for 8-9 months about 20 yrs ago, cold [place]   We discussed:  smoking cessation, benefits of using NRT or Chantix, bupropion for cravings. Answered questions about side effects of Chantix. Patient does not think now is a good time to quit smoking while he is working on diet changes for DM   Plan: Patient will let me know if he gets more serious about quitting.    Patient Goals/Self-Care Activities . Patient will:  - take medications as prescribed focus on medication adherence by routine target a minimum of 150 minutes of moderate intensity exercise weekly engage in dietary modifications by reducing sugar, carbs  Follow Up Plan: Telephone follow up appointment with care management team member scheduled for: 3 months      Medication Assistance:  Consider Jardiance PAP  Patient's preferred pharmacy is:  [facility] [place] [address] [place] [address] Phone: [phone] Fax: [phone]  Uses pill box? Yes Pt endorses  100% compliance  We discussed: Current pharmacy is preferred with insurance plan and patient is satisfied with pharmacy services Patient decided to: Continue current medication management strategy  Care Plan and Follow Up Patient Decision:  Patient agrees to Care Plan and Follow-up.  Plan: Telephone follow up appointment with care management team member scheduled for:  3 months  Charlene Brooke, PharmD, [place], CPP Clinical Pharmacist [facility] at [facility] [phone]

## 2020-07-30 ENCOUNTER — Other Ambulatory Visit: Payer: Self-pay | Admitting: Internal Medicine

## 2020-08-01 NOTE — Telephone Encounter (Signed)
Error

## 2020-08-03 ENCOUNTER — Other Ambulatory Visit: Payer: Self-pay | Admitting: Internal Medicine

## 2020-08-03 DIAGNOSIS — E1165 Type 2 diabetes mellitus with hyperglycemia: Secondary | ICD-10-CM

## 2020-08-03 DIAGNOSIS — IMO0002 Reserved for concepts with insufficient information to code with codable children: Secondary | ICD-10-CM

## 2020-08-07 ENCOUNTER — Telehealth: Payer: Self-pay | Admitting: Internal Medicine

## 2020-08-07 DIAGNOSIS — E1165 Type 2 diabetes mellitus with hyperglycemia: Secondary | ICD-10-CM

## 2020-08-07 DIAGNOSIS — IMO0002 Reserved for concepts with insufficient information to code with codable children: Secondary | ICD-10-CM

## 2020-08-07 MED ORDER — EMPAGLIFLOZIN 25 MG PO TABS: 25.0000 mg | ORAL_TABLET | Freq: Every day | ORAL | 3 refills | Status: DC

## 2020-08-07 NOTE — Telephone Encounter (Signed)
Patient was given a 90 day supply.

## 2020-08-07 NOTE — Telephone Encounter (Signed)
Patient said that his pharmacy told him that empagliflozin (JARDIANCE) 25 MG TABS tablet was denied. He also said that they gave him 60 tablets for metoprolol succinate (TOPROL-XL) 50 MG 24 hr tablet. Patient is requesting a 3 month supply. Please advise

## 2020-08-07 NOTE — Telephone Encounter (Signed)
Sent in jardiance refill. We did send in 90 of metoprolol so he would need to ask pharmacy why they did not dispense this to him (sometimes they do not have enough to dispense full rx.

## 2020-08-27 ENCOUNTER — Telehealth: Payer: Self-pay | Admitting: Internal Medicine

## 2020-08-27 ENCOUNTER — Other Ambulatory Visit: Payer: Self-pay

## 2020-08-27 ENCOUNTER — Other Ambulatory Visit: Payer: Self-pay | Admitting: Internal Medicine

## 2020-08-27 MED ORDER — TRIAMCINOLONE ACETONIDE 0.1 % EX CREA
1.0000 | TOPICAL_CREAM | Freq: Two times a day (BID) | CUTANEOUS | 0 refills | Status: DC
Start: 2020-08-27 — End: 2020-12-10

## 2020-08-27 MED ORDER — OMEPRAZOLE 20 MG PO CPDR: DELAYED_RELEASE_CAPSULE | ORAL | 0 refills | Status: DC

## 2020-08-27 NOTE — Telephone Encounter (Signed)
Sent in, if no improvement should have visit

## 2020-08-27 NOTE — Telephone Encounter (Signed)
Patient requesting new order for Triamcinolone acetonide for poison ivy  Patient calling to request 90 day supply of omeprazole (PRILOSEC) 20 MG capsule   [place], [address] AT [address]

## 2020-08-27 NOTE — Telephone Encounter (Signed)
Patient notified

## 2020-08-28 ENCOUNTER — Encounter: Payer: Self-pay | Admitting: Gastroenterology

## 2020-08-28 ENCOUNTER — Other Ambulatory Visit: Payer: Self-pay | Admitting: Internal Medicine

## 2020-09-14 ENCOUNTER — Other Ambulatory Visit: Payer: Self-pay | Admitting: Internal Medicine

## 2020-09-24 ENCOUNTER — Other Ambulatory Visit: Payer: Self-pay | Admitting: Internal Medicine

## 2020-09-25 ENCOUNTER — Ambulatory Visit: Payer: Medicare Other | Admitting: Internal Medicine

## 2020-09-26 ENCOUNTER — Other Ambulatory Visit: Payer: Self-pay | Admitting: Internal Medicine

## 2020-09-26 NOTE — Telephone Encounter (Signed)
Patient requesting 90 day supply omeprazole (PRILOSEC) 20 MG capsule

## 2020-09-27 MED ORDER — OMEPRAZOLE 20 MG PO CPDR: DELAYED_RELEASE_CAPSULE | ORAL | 1 refills | Status: DC

## 2020-10-02 ENCOUNTER — Ambulatory Visit (INDEPENDENT_AMBULATORY_CARE_PROVIDER_SITE_OTHER): Payer: Medicare Other | Admitting: Internal Medicine

## 2020-10-02 ENCOUNTER — Encounter: Payer: Self-pay | Admitting: Internal Medicine

## 2020-10-02 ENCOUNTER — Other Ambulatory Visit: Payer: Self-pay

## 2020-10-02 VITALS — BP 124/80 | HR 71 | Temp 97.7°F | Resp 18 | Wt 219.4 lb

## 2020-10-02 DIAGNOSIS — E1169 Type 2 diabetes mellitus with other specified complication: Secondary | ICD-10-CM | POA: Diagnosis not present

## 2020-10-02 DIAGNOSIS — E118 Type 2 diabetes mellitus with unspecified complications: Secondary | ICD-10-CM

## 2020-10-02 DIAGNOSIS — E785 Hyperlipidemia, unspecified: Secondary | ICD-10-CM | POA: Diagnosis not present

## 2020-10-02 DIAGNOSIS — F5101 Primary insomnia: Secondary | ICD-10-CM | POA: Diagnosis not present

## 2020-10-02 DIAGNOSIS — I2510 Atherosclerotic heart disease of native coronary artery without angina pectoris: Secondary | ICD-10-CM | POA: Diagnosis not present

## 2020-10-02 DIAGNOSIS — M542 Cervicalgia: Secondary | ICD-10-CM | POA: Diagnosis not present

## 2020-10-02 DIAGNOSIS — E1165 Type 2 diabetes mellitus with hyperglycemia: Secondary | ICD-10-CM

## 2020-10-02 DIAGNOSIS — J410 Simple chronic bronchitis: Secondary | ICD-10-CM

## 2020-10-02 DIAGNOSIS — IMO0002 Reserved for concepts with insufficient information to code with codable children: Secondary | ICD-10-CM

## 2020-10-02 LAB — LIPID PANEL
Cholesterol: 129 mg/dL (ref 0–200)
HDL: 35.3 mg/dL — ABNORMAL LOW (ref >39.00)
NonHDL: 94.08
Total CHOL/HDL Ratio: 4
Triglycerides: 258 mg/dL — ABNORMAL HIGH (ref 0.0–149.0)
VLDL: 51.6 mg/dL — ABNORMAL HIGH (ref 0.0–40.0)

## 2020-10-02 LAB — COMPREHENSIVE METABOLIC PANEL
ALT: 19 U/L (ref 0–53)
AST: 16 U/L (ref 0–37)
Albumin: 4.7 g/dL (ref 3.5–5.2)
Alkaline Phosphatase: 53 U/L (ref 39–117)
BUN: 19 mg/dL (ref 6–23)
CO2: 24 mEq/L (ref 19–32)
Calcium: 9.5 mg/dL (ref 8.4–10.5)
Chloride: 101 mEq/L (ref 96–112)
Creatinine, Ser: 0.98 mg/dL (ref 0.40–1.50)
GFR: 77.05 mL/min (ref >60.00)
Glucose, Bld: 169 mg/dL — ABNORMAL HIGH (ref 70–99)
Potassium: 4.1 mEq/L (ref 3.5–5.1)
Sodium: 136 mEq/L (ref 135–145)
Total Bilirubin: 0.5 mg/dL (ref 0.2–1.2)
Total Protein: 7.5 g/dL (ref 6.0–8.3)

## 2020-10-02 LAB — POCT GLYCOSYLATED HEMOGLOBIN (HGB A1C): Hemoglobin A1C: 8.3 % — AB (ref 4.0–5.6)

## 2020-10-02 LAB — LDL CHOLESTEROL, DIRECT: Direct LDL: 62 mg/dL

## 2020-10-02 MED ORDER — TRAZODONE HCL 50 MG PO TABS
50.0000 mg | ORAL_TABLET | Freq: Every day | ORAL | 3 refills | Status: DC
Start: 2020-10-02 — End: 2020-12-10

## 2020-10-02 MED ORDER — OMEPRAZOLE 20 MG PO CPDR: DELAYED_RELEASE_CAPSULE | ORAL | 3 refills | Status: DC

## 2020-10-02 NOTE — Patient Instructions (Addendum)
We have sent in trazodone to help with sleep to take in the evening.   We will leave the diabetes medicines the same.  We will check the neck for any cancer and the lungs.   The metformin should be 1000 mg twice a day so let us know if that is what you are taking.

## 2020-10-02 NOTE — Progress Notes (Signed)
   Subjective:   Patient ID: Brandon Killian., male    DOB: 1950/05/14, 70 y.o.   MRN: [id number]  HPI The patient is a 70 YO man coming in for follow up diabetes and cholesterol. We did make changes to his regimens back in April but he is not sure he made any changes. He was actually taking jardiance already although he had implied that he was not due to cost. He is still taking jardiance and metformin currently. Also having some new sleep problems.  Also he was not going to mention but he has been having some pain and maybe swelling in the neck for months and is scared about it due to brother having cancer in throat. He has had direct visualization but years ago.   Review of Systems  Constitutional: Negative.   HENT: Negative.    Eyes: Negative.   Respiratory:  Positive for cough. Negative for chest tightness and shortness of breath.   Cardiovascular:  Negative for chest pain, palpitations and leg swelling.  Gastrointestinal:  Negative for abdominal distention, abdominal pain, constipation, diarrhea, nausea and vomiting.  Musculoskeletal: Negative.   Skin: Negative.   Neurological: Negative.   Psychiatric/Behavioral:  Positive for sleep disturbance.    Objective:  Physical Exam Constitutional:      Appearance: He is well-developed.  HENT:     Head: Normocephalic and atraumatic.  Neck:     Comments: No masses detected on exam Cardiovascular:     Rate and Rhythm: Normal rate and regular rhythm.  Pulmonary:     Effort: Pulmonary effort is normal. No respiratory distress.     Breath sounds: Normal breath sounds. No wheezing or rales.  Abdominal:     General: Bowel sounds are normal. There is no distension.     Palpations: Abdomen is soft.     Tenderness: There is no abdominal tenderness. There is no rebound.  Musculoskeletal:     Cervical back: Normal range of motion. No rigidity.  Lymphadenopathy:     Cervical: No cervical adenopathy.  Skin:    General: Skin is warm and  dry.  Neurological:     Mental Status: He is alert and oriented to person, place, and time.     Coordination: Coordination normal.    Vitals:   10/02/20 0759  BP: 124/80  Pulse: 71  Resp: 18  Temp: 97.7 F (36.5 C)  TempSrc: Oral  SpO2: 93%  Weight: 219 lb 6.4 oz (99.5 kg)    This visit occurred during the SARS-CoV-2 public health emergency.  Safety protocols were in place, including screening questions prior to the visit, additional usage of staff PPE, and extensive cleaning of exam room while observing appropriate contact time as indicated for disinfecting solutions.   Assessment & Plan:

## 2020-10-03 ENCOUNTER — Ambulatory Visit
Admission: RE | Admit: 2020-10-03 | Discharge: 2020-10-03 | Disposition: A | Discharge status: Home / Self Care | Payer: Medicare Other | Source: Ambulatory Visit | Attending: Internal Medicine | Admitting: Internal Medicine

## 2020-10-03 DIAGNOSIS — M542 Cervicalgia: Secondary | ICD-10-CM | POA: Diagnosis not present

## 2020-10-03 IMAGING — US US SOFT TISSUE HEAD/NECK
1 series · 14 of 18 positions shown · non-contrast
Comparison: None.

CLINICAL DATA: Bilateral neck pain for the past 1 month. No
palpable abnormalities.

EXAM:
ULTRASOUND OF HEAD/NECK SOFT TISSUES
TECHNIQUE: Ultrasound examination of the head and neck soft tissues was
performed in the area of clinical concern.

[Series 1: us soft tissue head/neck · 0.08mm/px · 14 of 18 slices shown]
[im 1/18]
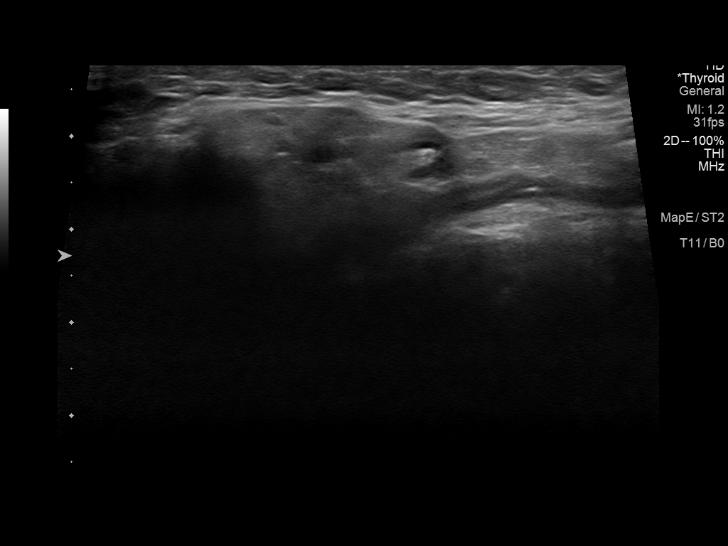
[im 2/18]
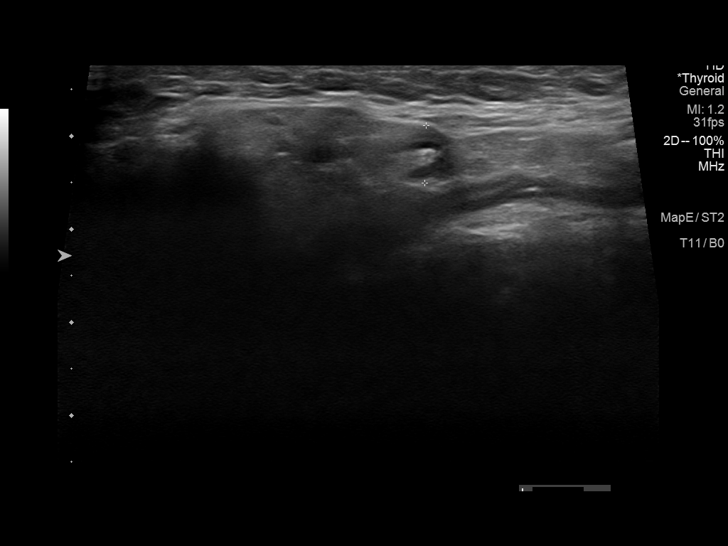
[im 4/18]
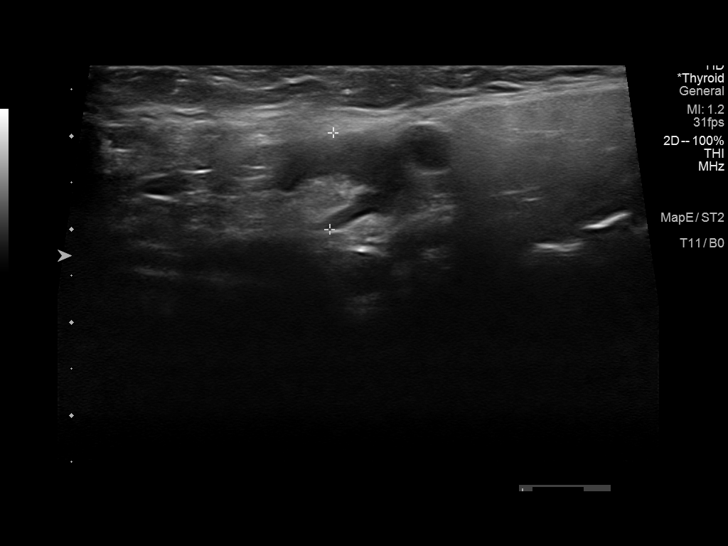
[im 5/18]
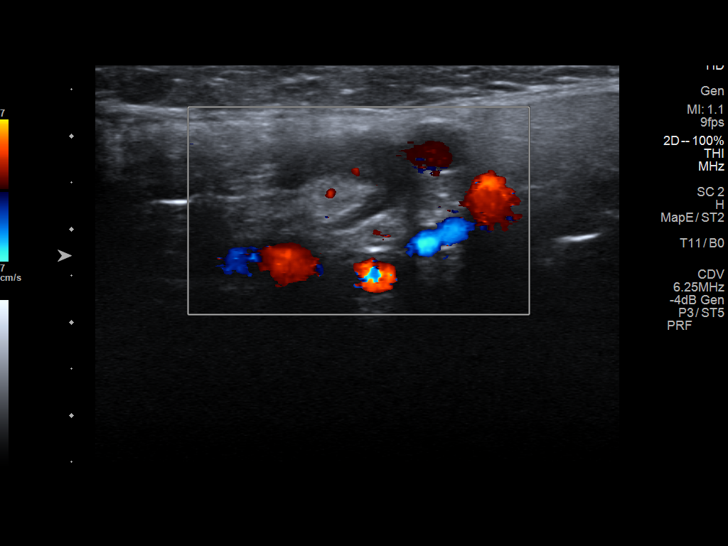
[im 6/18]
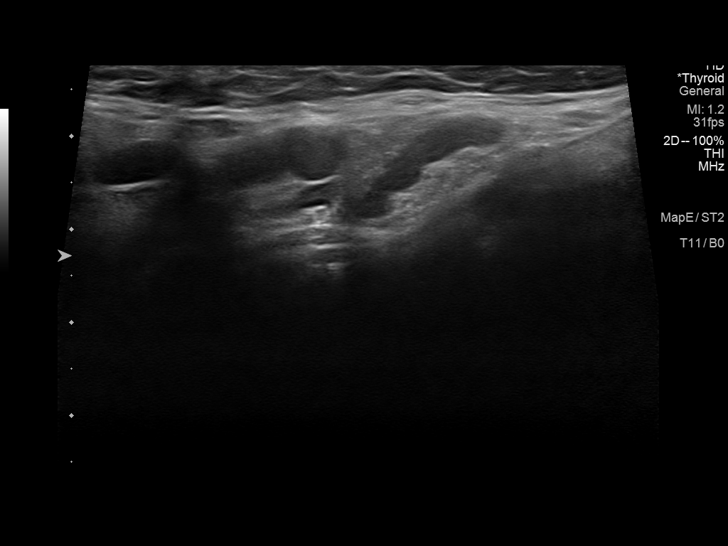
[im 8/18]
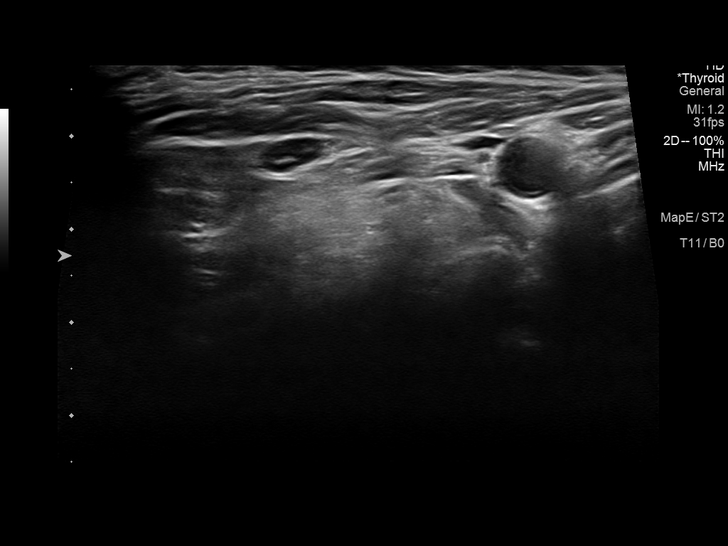
[im 9/18]
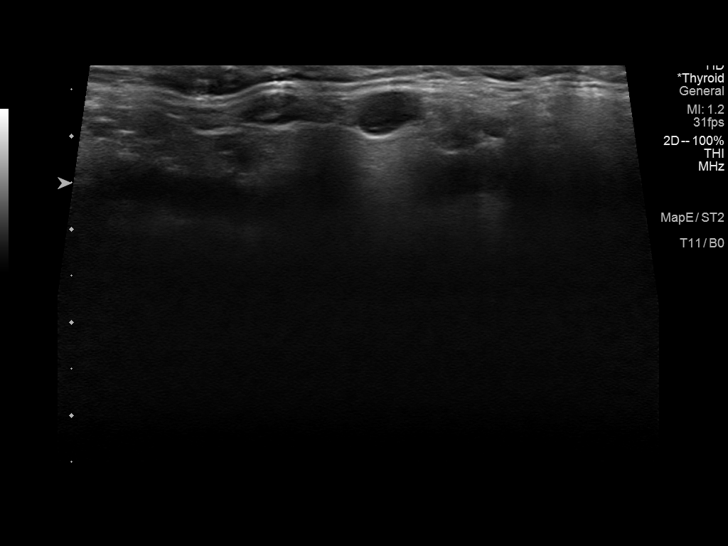
[im 10/18]
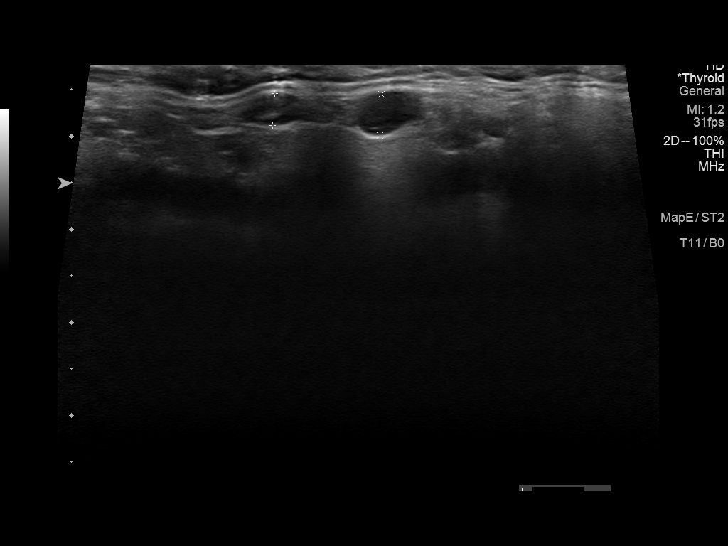
[im 11/18]
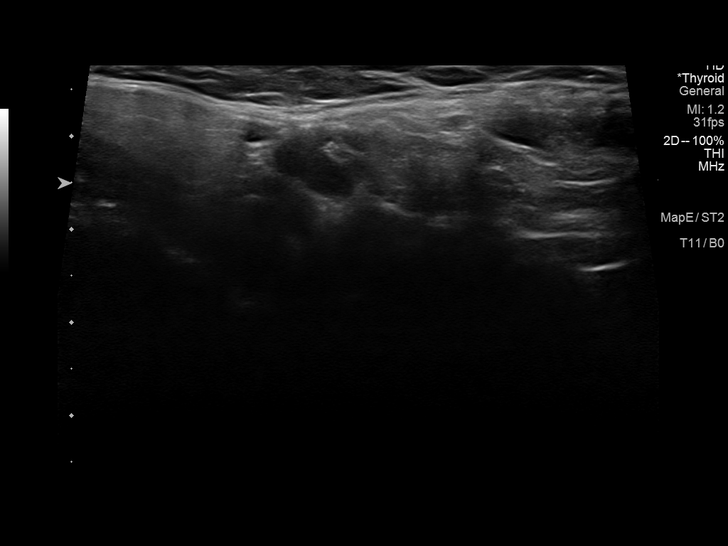
[im 13/18]
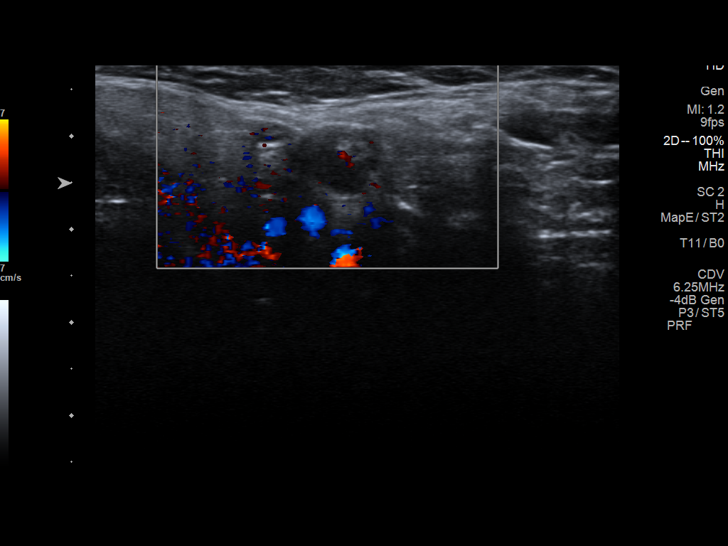
[im 14/18]
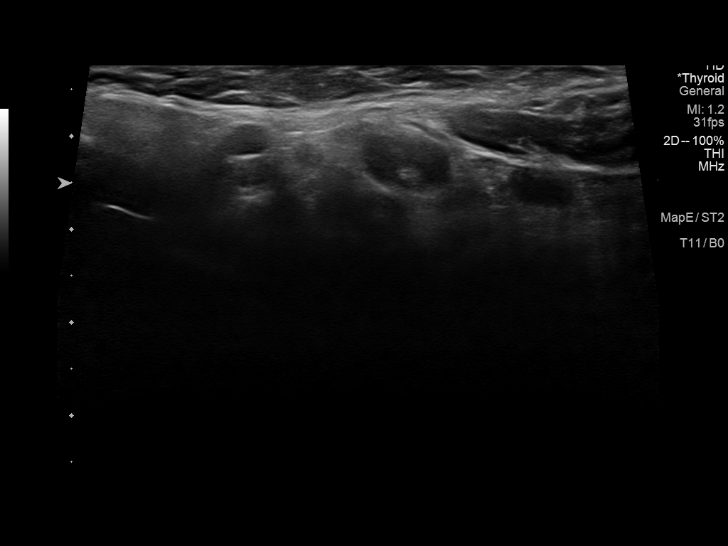
[im 15/18]
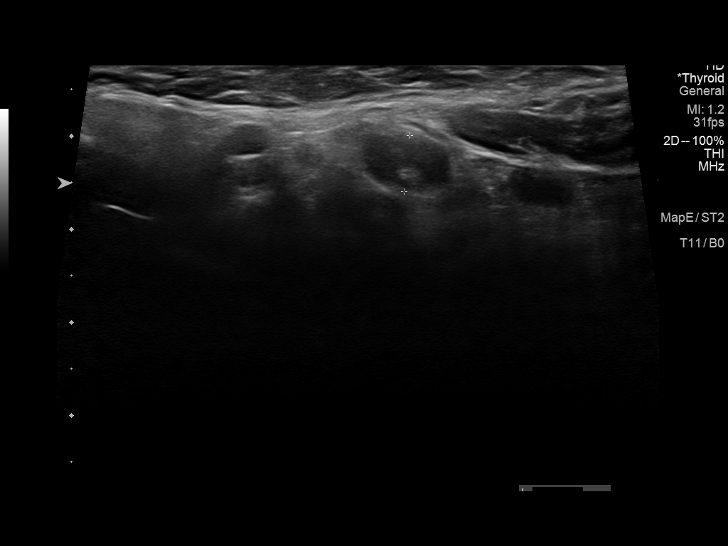
[im 17/18]
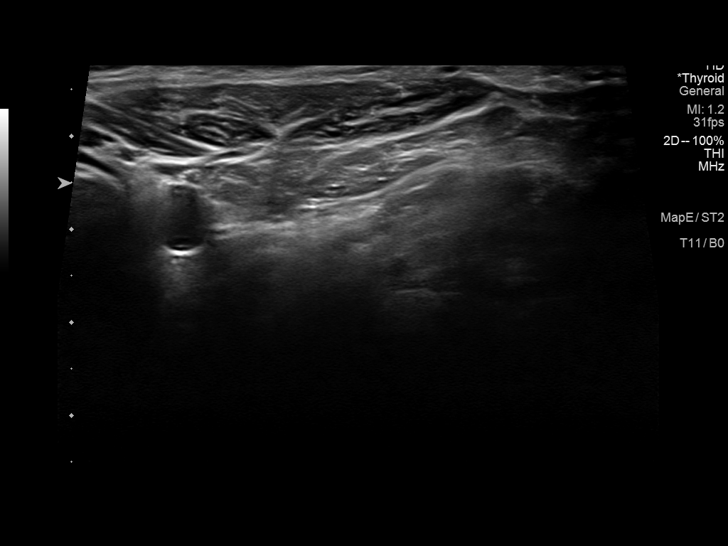
[im 18/18]
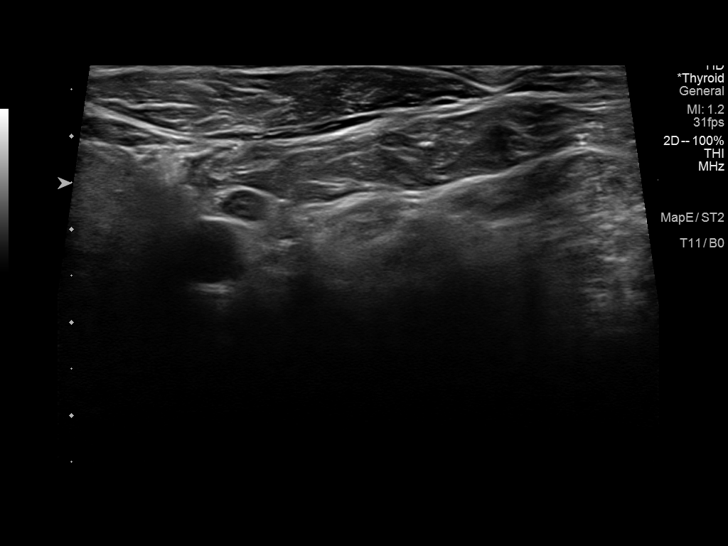

[14 of 18 positions shown; findings below may reference images not displayed]

FINDINGS: Sonographic interrogation of the right and left cervical chain
demonstrates normal subcutaneous soft tissues, strap muscles and
morphologically unremarkable and nonenlarged cervical lymph nodes.
No suspicious lymphadenopathy, cystic mass, solid mass or other
focal abnormality.
IMPRESSION: Negative sonographic interrogation of the regions of clinical
concern.

## 2020-10-05 DIAGNOSIS — G4700 Insomnia, unspecified: Secondary | ICD-10-CM | POA: Insufficient documentation

## 2020-10-05 NOTE — Assessment & Plan Note (Signed)
POC HgA1c done today which is slightly improved to 8.3 from 8.7 although still uncontrolled. We did discuss the risk for complications with persistent HgA1c >8. He does not wish to add medications as he was able to reduce this slightly on his own with dietary changes and feels he still has some room to improve. Offered nutrition referral and he does not feel he needs this. Checking lipid panel and adjust simvastatin as we increased the dose of this last visit.

## 2020-10-05 NOTE — Assessment & Plan Note (Signed)
Ordered US neck bilateral non-thyroid to assess. I do not feel any masses on exam. He is a smoker so if negative we could discuss return to ENT for laryngoscopy.

## 2020-10-05 NOTE — Assessment & Plan Note (Signed)
Checking lipid panel as we increased to simvastatin 40 mg daily about 3 months ago.

## 2020-10-05 NOTE — Assessment & Plan Note (Signed)
He is also a little concerned about lung cancer and is open to lung cancer screening for which he qualifies so referral for low dose CT scan done today. Counseled about need to cut back/quit smoking.

## 2020-10-05 NOTE — Assessment & Plan Note (Signed)
Worsening problem but has been for some time he just never sought care for this. Rx trazodone to use prn for sleep and he would prefer not to take anything regularly or that is habit forming. Problems mostly with sleep maintenance and is able to fall asleep okay.

## 2020-10-09 ENCOUNTER — Telehealth: Payer: Self-pay | Admitting: Internal Medicine

## 2020-10-09 NOTE — Telephone Encounter (Signed)
Patient is requesting call back in regards to previous phone call w/ nurse about increase in dosage for one of his rx  Callback # [phone]

## 2020-10-09 NOTE — Telephone Encounter (Signed)
See below

## 2020-10-09 NOTE — Telephone Encounter (Signed)
Does he want to change cholesterol meds?

## 2020-10-10 ENCOUNTER — Encounter: Payer: Self-pay | Admitting: Gastroenterology

## 2020-10-10 NOTE — Telephone Encounter (Signed)
See below

## 2020-10-10 NOTE — Telephone Encounter (Signed)
   Patient wanted to discuss Simvastatin and Metformin. He does not want to increase dosage of medications. The issue was patient was taking a old dose of medications. He was taking 20mg  Simvastatin he has since gotten the issue corrected by picking up the right med from pharmacy

## 2020-10-11 NOTE — Telephone Encounter (Signed)
Noted  

## 2020-10-19 ENCOUNTER — Telehealth: Payer: Medicare Other

## 2020-10-24 ENCOUNTER — Telehealth: Payer: Self-pay | Admitting: Internal Medicine

## 2020-10-24 DIAGNOSIS — C44311 Basal cell carcinoma of skin of nose: Secondary | ICD-10-CM | POA: Diagnosis not present

## 2020-10-24 DIAGNOSIS — D692 Other nonthrombocytopenic purpura: Secondary | ICD-10-CM | POA: Diagnosis not present

## 2020-10-24 DIAGNOSIS — D485 Neoplasm of uncertain behavior of skin: Secondary | ICD-10-CM | POA: Diagnosis not present

## 2020-10-24 DIAGNOSIS — L821 Other seborrheic keratosis: Secondary | ICD-10-CM | POA: Diagnosis not present

## 2020-10-24 DIAGNOSIS — Z85828 Personal history of other malignant neoplasm of skin: Secondary | ICD-10-CM | POA: Diagnosis not present

## 2020-10-24 DIAGNOSIS — L820 Inflamed seborrheic keratosis: Secondary | ICD-10-CM | POA: Diagnosis not present

## 2020-10-24 NOTE — Telephone Encounter (Signed)
Patient is requesting an order for chest x-ray. He mentioned during his last visit they had discussed having both the x-ray and the ultrasound done. Requesting a callback to discuss whether he stills should have the x-ray done.   Best callback #: [phone]

## 2020-10-24 NOTE — Telephone Encounter (Signed)
Called patient. Went straight to [employer]. LVM asking him to return my call here at the office. Office number was provided.

## 2020-10-25 NOTE — Telephone Encounter (Signed)
Patient called back with concerns about getting a chest xray. Patient was also given information to contact Pulmonary to schedule appointment

## 2020-10-26 ENCOUNTER — Other Ambulatory Visit: Payer: Self-pay | Admitting: *Deleted

## 2020-10-26 DIAGNOSIS — F17210 Nicotine dependence, cigarettes, uncomplicated: Secondary | ICD-10-CM

## 2020-10-26 DIAGNOSIS — Z87891 Personal history of nicotine dependence: Secondary | ICD-10-CM

## 2020-10-29 ENCOUNTER — Other Ambulatory Visit: Payer: Self-pay

## 2020-10-29 ENCOUNTER — Ambulatory Visit (INDEPENDENT_AMBULATORY_CARE_PROVIDER_SITE_OTHER): Payer: Medicare Other | Admitting: Acute Care

## 2020-10-29 ENCOUNTER — Encounter: Payer: Self-pay | Admitting: Acute Care

## 2020-10-29 DIAGNOSIS — F17210 Nicotine dependence, cigarettes, uncomplicated: Secondary | ICD-10-CM | POA: Diagnosis not present

## 2020-10-29 DIAGNOSIS — Z122 Encounter for screening for malignant neoplasm of respiratory organs: Secondary | ICD-10-CM

## 2020-10-29 NOTE — Progress Notes (Signed)
Virtual Visit via Telephone Note  I connected with [name]. on 10/29/20 at  2:00 PM EDT by telephone and verified that I am speaking with the correct person using two identifiers.  Location: Patient: At home Provider: [address], [place], [place], [address]    I discussed the limitations, risks, security and privacy concerns of performing an evaluation and management service by telephone and the availability of in person appointments. I also discussed with the patient that there may be a patient responsible charge related to this service. The patient expressed understanding and agreed to proceed.   Shared Decision Making Visit Lung Cancer Screening Program [id number])   Eligibility: Age 70 y.o. Pack Years Smoking History Calculation 116 pack year smoking history (# packs/per year x # years smoked) Recent History of coughing up blood  no Unexplained weight loss? no ( >Than 15 pounds within the last 6 months ) Prior History Lung / other cancer no (Diagnosis within the last 5 years already requiring surveillance chest CT Scans). Smoking Status Current Smoker Former Smokers: Years since quit: NA  Quit Date: NA  Visit Components: Discussion included one or more decision making aids. yes Discussion included risk/benefits of screening. yes Discussion included potential follow up diagnostic testing for abnormal scans. yes Discussion included meaning and risk of over diagnosis. yes Discussion included meaning and risk of False Positives. yes Discussion included meaning of total radiation exposure. yes  Counseling Included: Importance of adherence to annual lung cancer LDCT screening. yes Impact of comorbidities on ability to participate in the program. yes Ability and willingness to under diagnostic treatment. yes  Smoking Cessation Counseling: Current Smokers:  Discussed importance of smoking cessation. yes Information about tobacco cessation classes and  interventions provided to patient. yes Patient provided with "ticket" for LDCT Scan. yes Symptomatic Patient. no  Counseling Diagnosis Code: Tobacco Use Z72.0 Asymptomatic Patient yes  Counseling (Intermediate counseling: > three minutes counseling) [id number] Former Smokers:  Discussed the importance of maintaining cigarette abstinence. yes Diagnosis Code: Personal History of Nicotine Dependence. B34.193 Information about tobacco cessation classes and interventions provided to patient. Yes Patient provided with "ticket" for LDCT Scan. yes Written Order for Lung Cancer Screening with LDCT placed in Epic. Yes (CT Chest Lung Cancer Screening Low Dose W/O CM) [id number] Z12.2-Screening of respiratory organs Z87.891-Personal history of nicotine dependence  I have spent 25 minutes of face to face time with Mr. Valley discussing the risks and benefits of lung cancer screening. We viewed a power point together that explained in detail the above noted topics. We paused at intervals to allow for questions to be asked and answered to ensure understanding.We discussed that the single most powerful action that he can take to decrease his risk of developing lung cancer is to quit smoking. We discussed whether or not he is ready to commit to setting a quit date. We discussed options for tools to aid in quitting smoking including nicotine replacement therapy, non-nicotine medications, support groups, Quit Smart classes, and behavior modification. We discussed that often times setting smaller, more achievable goals, such as eliminating 1 cigarette a day for a week and then 2 cigarettes a day for a week can be helpful in slowly decreasing the number of cigarettes smoked. This allows for a sense of accomplishment as well as providing a clinical benefit. I gave him the " Be Stronger Than Your Excuses" card with contact information for community resources, classes, free nicotine replacement therapy, and access to mobile  apps, text  messaging, and on-line smoking cessation help. I have also given him my card and contact information in the event he needs to contact me. We discussed the time and location of the scan, and that either Doroteo Glassman RN or I will call with the results within 24-48 hours of receiving them. I have offered him  a copy of the power point we viewed  as a resource in the event they need reinforcement of the concepts we discussed today in the office. The patient verbalized understanding of all of  the above and had no further questions upon leaving the office. They have my contact information in the event they have any further questions.  I spent 4-5 minutes counseling on smoking cessation and the health risks of continued tobacco abuse.  I explained to the patient that there has been a high incidence of coronary artery disease noted on these exams. I explained that this is a non-gated exam therefore degree or severity cannot be determined. This patient is on statin therapy. I have asked the patient to follow-up with their PCP regarding any incidental finding of coronary artery disease and management with diet or medication as their PCP  feels is clinically indicated. The patient verbalized understanding of the above and had no further questions upon completion of the visit.  Pt. Was given extensive directions on how to get to [facility] for his scan.       Magdalen Spatz, NP 10/29/2020

## 2020-10-29 NOTE — Patient Instructions (Signed)
Thank you for participating in the [facility]. It was our pleasure to meet you today. We will call you with the results of your scan within the next few days. Your scan will be assigned a Lung RADS category score by the physicians reading the scans.  This Lung RADS score determines follow up scanning.  See below for description of categories, and follow up screening recommendations. We will be in touch to schedule your follow up screening annually or based on recommendations of our providers. We will fax a copy of your scan results to your Primary Care Physician, or the physician who referred you to the program, to ensure they have the results. Please call the office if you have any questions or concerns regarding your scanning experience or results.  Our office number is [phone]. Please speak with Denise Phelps, RN. She is our Lung Cancer Screening RN. If she is unavailable when you call, please have the office staff send her a message. She will return your call at her earliest convenience. Remember, if your scan is normal, we will scan you annually as long as you continue to meet the criteria for the program. (Age 55-77, Current smoker or smoker who has quit within the last 15 years). If you are a smoker, remember, quitting is the single most powerful action that you can take to decrease your risk of lung cancer and other pulmonary, breathing related problems. We know quitting is hard, and we are here to help.  Please let us know if there is anything we can do to help you meet your goal of quitting. If you are a former smoker, congratulations. We are proud of you! Remain smoke free! Remember you can refer friends or family members through the number above.  We will screen them to make sure they meet criteria for the program. Thank you for helping us take better care of you by participating in Lung Screening.  Lung RADS Categories:  Lung RADS 1: no nodules  or definitely non-concerning nodules.  Recommendation is for a repeat annual scan in 12 months.  Lung RADS 2:  nodules that are non-concerning in appearance and behavior with a very low likelihood of becoming an active cancer. Recommendation is for a repeat annual scan in 12 months.  Lung RADS 3: nodules that are probably non-concerning , includes nodules with a low likelihood of becoming an active cancer.  Recommendation is for a 6-month repeat screening scan. Often noted after an upper respiratory illness. We will be in touch to make sure you have no questions, and to schedule your 6-month scan.  Lung RADS 4 A: nodules with concerning findings, recommendation is most often for a follow up scan in 3 months or additional testing based on our provider's assessment of the scan. We will be in touch to make sure you have no questions and to schedule the recommended 3 month follow up scan.  Lung RADS 4 B:  indicates findings that are concerning. We will be in touch with you to schedule additional diagnostic testing based on our provider's  assessment of the scan.   

## 2020-11-02 ENCOUNTER — Ambulatory Visit (HOSPITAL_BASED_OUTPATIENT_CLINIC_OR_DEPARTMENT_OTHER)
Admission: RE | Admit: 2020-11-02 | Discharge: 2020-11-02 | Disposition: A | Discharge status: Home / Self Care | Payer: Medicare Other | Source: Ambulatory Visit | Attending: Acute Care | Admitting: Acute Care

## 2020-11-02 ENCOUNTER — Other Ambulatory Visit: Payer: Self-pay

## 2020-11-02 DIAGNOSIS — F17210 Nicotine dependence, cigarettes, uncomplicated: Secondary | ICD-10-CM | POA: Insufficient documentation

## 2020-11-02 DIAGNOSIS — Z87891 Personal history of nicotine dependence: Secondary | ICD-10-CM | POA: Diagnosis not present

## 2020-11-02 IMAGING — CT CT CHEST LUNG CANCER SCREENING LOW DOSE W/O CM
3 of 4 series · 16 of 40 positions shown, 18 images · non-contrast
Comparison: None.

CLINICAL DATA: Lung cancer screening. Eighty pack year history.
Current asymptomatic smoker.

EXAM:
CT CHEST WITHOUT CONTRAST LOW-DOSE FOR LUNG CANCER SCREENING
TECHNIQUE: Multidetector CT imaging of the chest was performed following the
standard protocol without IV contrast.

[Series 2: ldct soft tissue · axial · 0.82mm/px · z∈[+1009,+1289]mm · 8 of 68 slices shown]
[im 6/68  mediastinal]
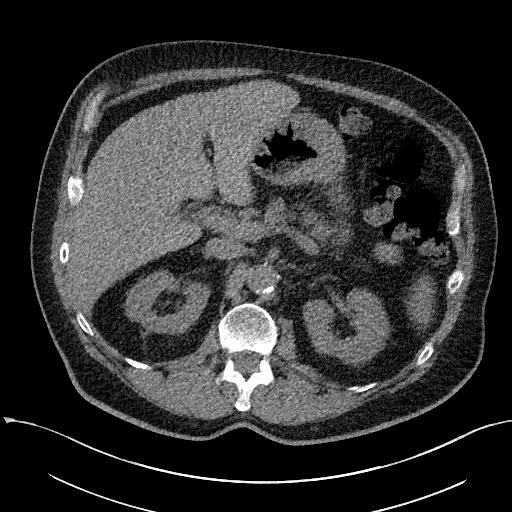
[im 13/68  mediastinal]
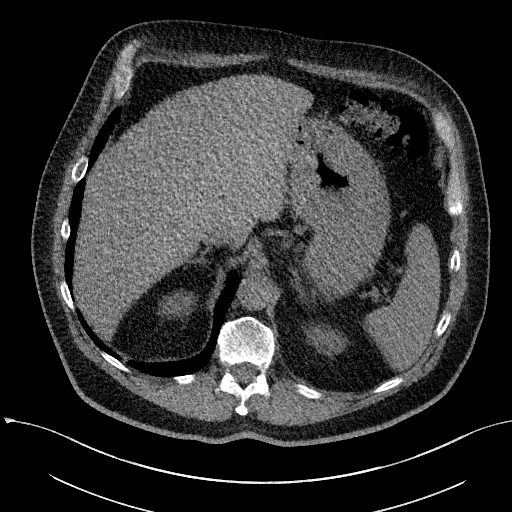
[im 21/68  mediastinal]
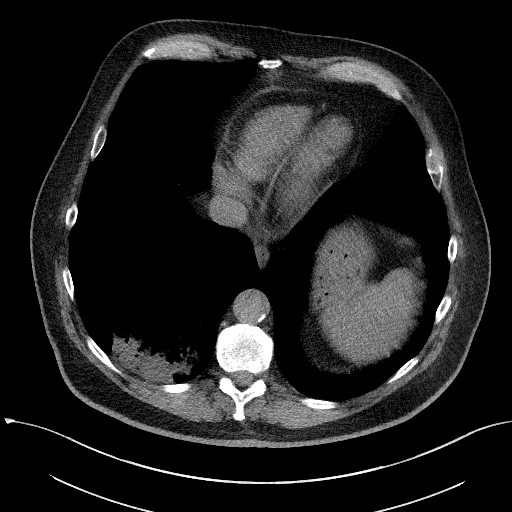
[im 29/68  mediastinal]
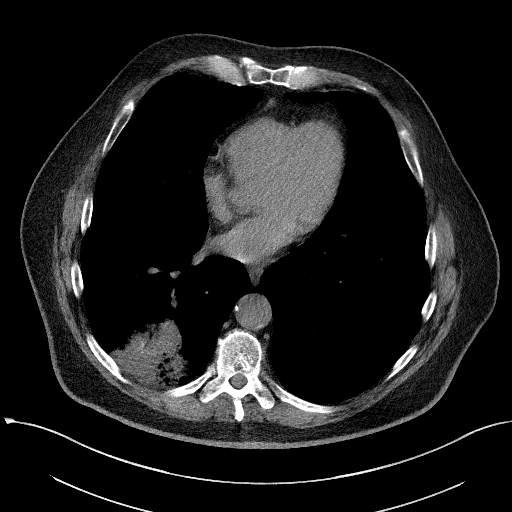
[im 39/68  mediastinal]
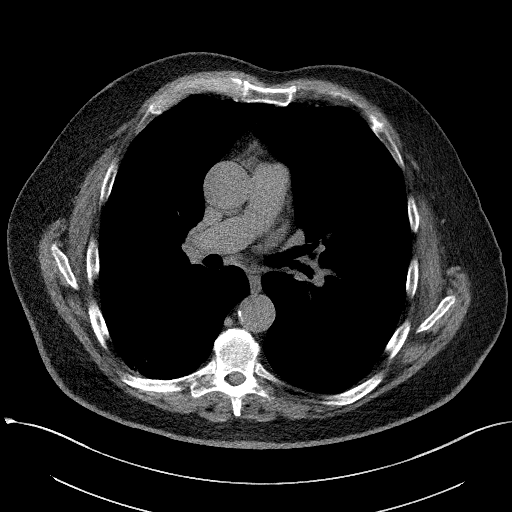
[im 47/68  mediastinal]
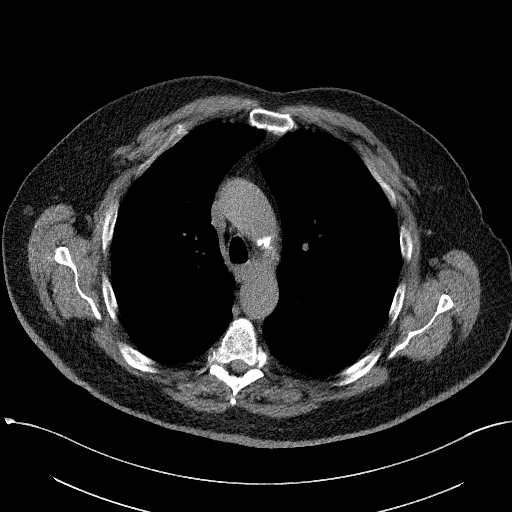
[im 55/68  mediastinal]
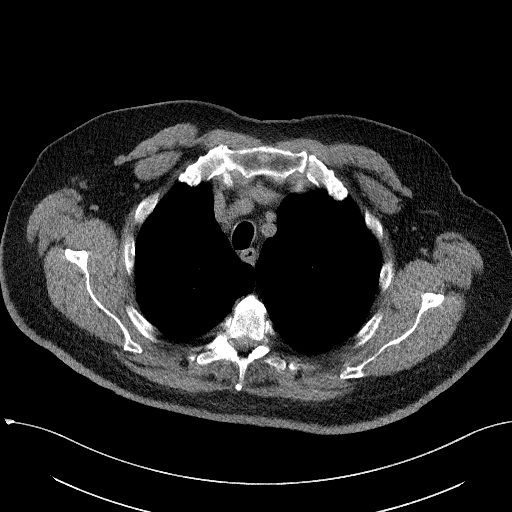
[im 62/68  mediastinal]
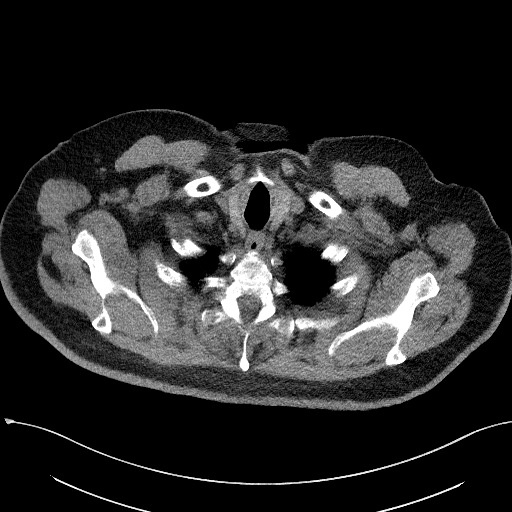

[Series 4: coronal soft · coronal · 0.66mm/px · 3 of 332 slices shown]
[im 67/332  lung]
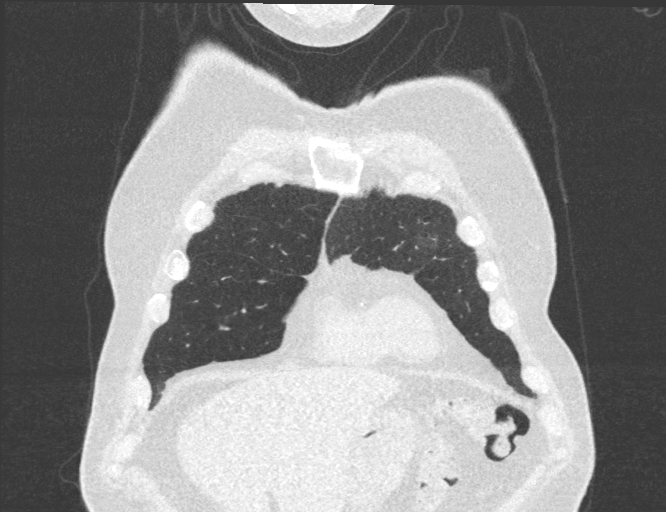
[im 133/332  lung]
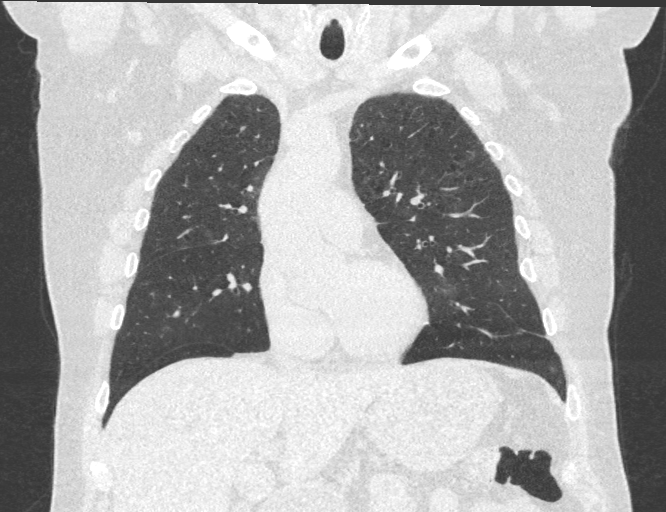
[im 199/332  lung]
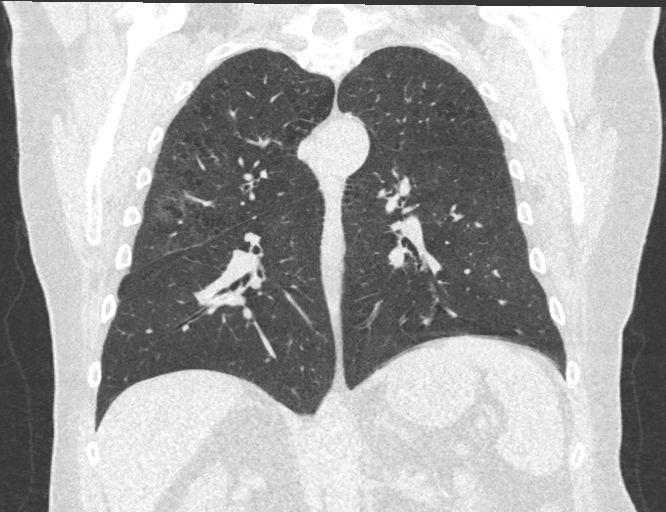

[ct lung segmentation data · axial · 0.82mm/px · z∈[+981,+981]mm · 5 of 339 frames shown]
[frame 1/339  mediastinal]
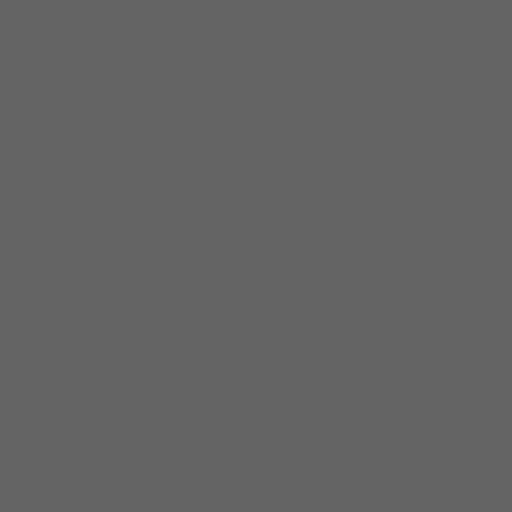
[frame 1/339  lung]
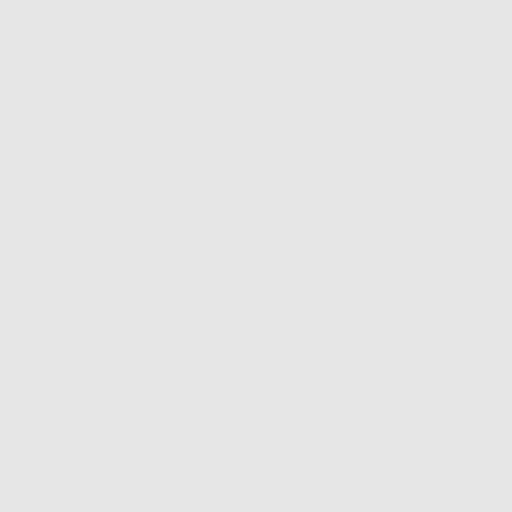
[frame 38/339  lung]
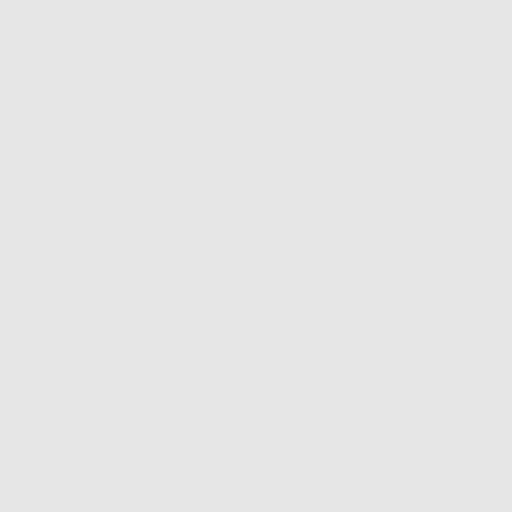
[frame 76/339  lung]
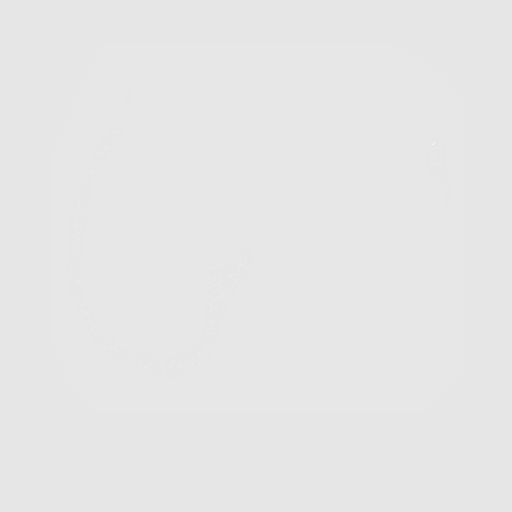
[frame 113/339  lung]
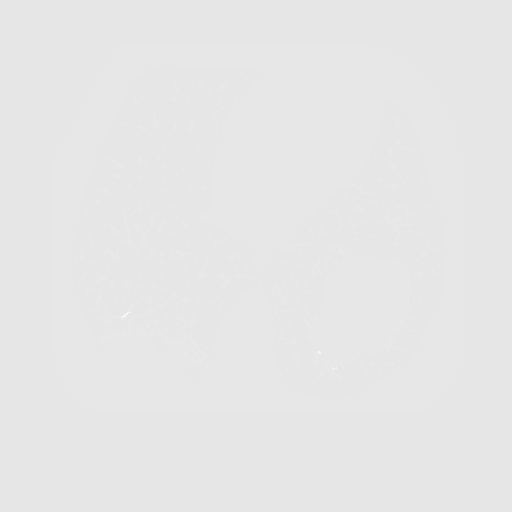
[frame 151/339  mediastinal]
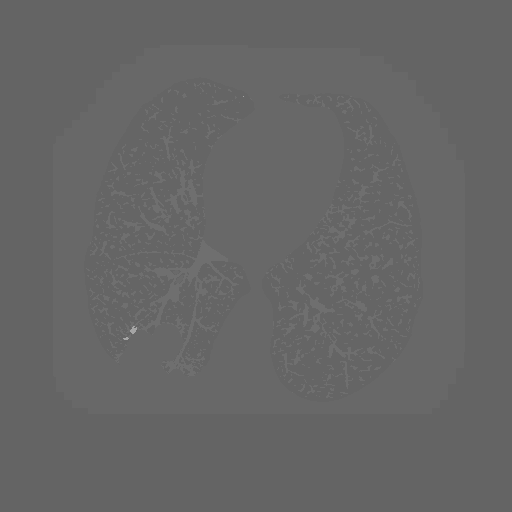
[frame 151/339  lung]
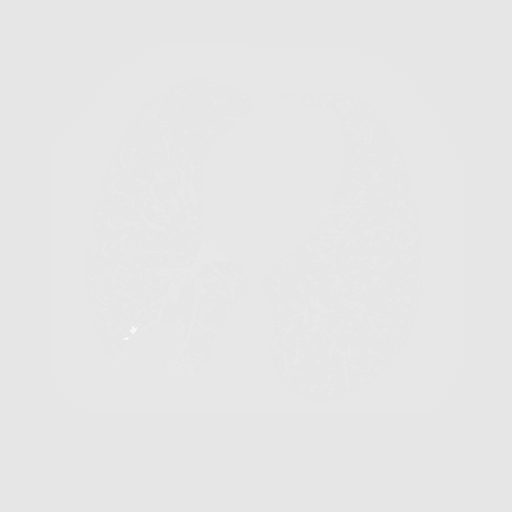

[16 of 40 positions shown; findings below may reference images not displayed]

FINDINGS: Cardiovascular: Normal heart size. No pericardial effusion. Aortic
atherosclerosis and coronary artery calcifications.

Mediastinum/Nodes: No enlarged mediastinal, hilar, or axillary lymph
nodes. Thyroid gland, trachea, and esophagus demonstrate no
significant findings.

Lungs/Pleura: Moderate centrilobular and paraseptal emphysema. No
pleural effusion, atelectasis or pneumothorax. Within the posterior
right lower lobe there is a large part solid mass which measures
72.6 mm with central solid component measuring 57 mm, image 214/3.

Upper Abdomen: No acute abnormality. Aortic atherosclerosis. Cyst is
seen arising off the posterior cortex of the left kidney measuring
3.7 cm

Musculoskeletal: No chest wall mass or suspicious bone lesions
identified.
IMPRESSION: 1. Lung-RADS 4B, suspicious. Additional imaging evaluation or
consultation with Pulmonology or Thoracic Surgery recommended.
2. Coronary artery calcification

Aortic Atherosclerosis ([IK]-[IK]) and Emphysema ([IK]-[IK]).

## 2020-11-09 ENCOUNTER — Telehealth: Payer: Self-pay | Admitting: Internal Medicine

## 2020-11-09 ENCOUNTER — Telehealth: Payer: Self-pay

## 2020-11-09 ENCOUNTER — Other Ambulatory Visit: Payer: Self-pay | Admitting: Acute Care

## 2020-11-09 ENCOUNTER — Telehealth: Payer: Self-pay | Admitting: Acute Care

## 2020-11-09 DIAGNOSIS — R911 Solitary pulmonary nodule: Secondary | ICD-10-CM

## 2020-11-09 NOTE — Telephone Encounter (Signed)
Noted thanks °

## 2020-11-09 NOTE — Telephone Encounter (Signed)
Pt has spoke with Pulmonology and is aware of his results. Dr. [name] is aware of the next steps of testing for the patient.

## 2020-11-09 NOTE — Telephone Encounter (Signed)
Patient would like provider or nurse to call him back & discuss results from CT Scan on 0826.[place]

## 2020-11-09 NOTE — Telephone Encounter (Signed)
Spoke with the patient and I advised him that he needs to contact his pulmonologist since they are the ones who ordered it. Patient verbalized understanding.   Dr. [name] is aware.

## 2020-11-09 NOTE — Telephone Encounter (Signed)
I have called the patient with the results of his low dose CT Chest. I explained that he has a mass in his right upper lobe. He endorses pain in his throat x 5 years. He did see ENT, but there was no acute issue found at the time.  I explained that we need to do follow up imaging. I explained that we will do a PET scan and PFT's to better evaluate the mass. He is in agreement with this plan. Once PET scan results, we will have patient seen in the office to determine best plan of care. He is in agreement with this plan. He verbalized understanding. He knows he will get calls today to get these scheduled.   Dr. [name], I wanted to make sure you were aware. We will keep you updated on results of additional imaging and plan of care.   I have called Dr. Nathanial Millman office to let them know the results of the scan, and the plan for PET scan and PFT's.

## 2020-11-09 NOTE — Telephone Encounter (Signed)
NP called to notify PCP that lung cancer screening done today. Large mass found in right lower lobe.  This will be managed by pulmonary & they will keep provider updated. Next steps is PET scan & pulmonary function tests; biopsy to follow if indicated.

## 2020-11-13 ENCOUNTER — Telehealth: Payer: Self-pay | Admitting: Acute Care

## 2020-11-13 NOTE — Telephone Encounter (Signed)
disregard

## 2020-11-14 NOTE — Progress Notes (Signed)
I have called the patient with the results of his low dose CT Chest. I explained that he has a mass in his right upper lobe. He endorses pain in his throat x 5 years. He did see ENT, but there was no acute issue found at the time.  I explained that we need to do follow up imaging. I explained that we will do a PET scan and PFT's to better evaluate the mass. He is in agreement with this plan. Once PET scan results, we will have patient seen in the office to determine best plan of care. He is in agreement with this plan. He verbalized understanding. He knows he will get calls today to get these scheduled.   Dr. [name], I wanted to make sure you were aware. We will keep you updated on results of additional imaging and plan of care.   I have called Dr. Nathanial Millman office to let them know the results of the scan, and the plan for PET scan and PFT's.  PET is pending for 9/21

## 2020-11-15 ENCOUNTER — Ambulatory Visit (INDEPENDENT_AMBULATORY_CARE_PROVIDER_SITE_OTHER): Payer: Medicare Other | Admitting: Internal Medicine

## 2020-11-15 ENCOUNTER — Other Ambulatory Visit: Payer: Self-pay

## 2020-11-15 DIAGNOSIS — R911 Solitary pulmonary nodule: Secondary | ICD-10-CM

## 2020-11-15 LAB — PULMONARY FUNCTION TEST
DL/VA % pred: 81 %
DL/VA: 3.25 ml/min/mmHg/L
DLCO cor % pred: 79 %
DLCO cor: 21.62 ml/min/mmHg
DLCO unc % pred: 79 %
DLCO unc: 21.62 ml/min/mmHg
FEF 25-75 Post: 2.13 L/sec
FEF 25-75 Pre: 1.84 L/sec
FEF2575-%Change-Post: 15 %
FEF2575-%Pred-Post: 82 %
FEF2575-%Pred-Pre: 71 %
FEV1-%Change-Post: 0 %
FEV1-%Pred-Post: 91 %
FEV1-%Pred-Pre: 90 %
FEV1-Post: 3.14 L
FEV1-Pre: 3.11 L
FEV1FVC-%Change-Post: 0 %
FEV1FVC-%Pred-Pre: 92 %
FEV6-%Change-Post: 0 %
FEV6-%Pred-Post: 103 %
FEV6-%Pred-Pre: 102 %
FEV6-Post: 4.58 L
FEV6-Pre: 4.56 L
FEV6FVC-%Change-Post: 0 %
FEV6FVC-%Pred-Post: 105 %
FEV6FVC-%Pred-Pre: 105 %
FVC-%Change-Post: 0 %
FVC-%Pred-Post: 97 %
FVC-%Pred-Pre: 97 %
FVC-Post: 4.59 L
FVC-Pre: 4.58 L
Post FEV1/FVC ratio: 68 %
Post FEV6/FVC ratio: 100 %
Pre FEV1/FVC ratio: 68 %
Pre FEV6/FVC Ratio: 100 %
RV % pred: 49 %
RV: 1.3 L
TLC % pred: 83 %
TLC: 6.19 L

## 2020-11-15 NOTE — Patient Instructions (Signed)
Full PFT performed today. °

## 2020-11-15 NOTE — Progress Notes (Signed)
Full PFT performed today. °

## 2020-11-27 ENCOUNTER — Telehealth: Payer: Medicare Other

## 2020-11-27 NOTE — Progress Notes (Deleted)
Chronic Care Management Pharmacy Note  11/27/2020 Name:  Brandon Rubio. MRN:  [id number] DOB:  12-Oct-1950   Summary: ***  Recommendations/Changes made from today's visit: ***  Plan: ***  Subjective: Brandon Rubio. is an 70 y.o. year old male who is a primary patient of [name], MD.  The CCM team was consulted for assistance with disease management and care coordination needs.    Engaged with patient by telephone for follow up visit in response to provider referral for pharmacy case management and/or care coordination services.   Consent to Services:  The patient was given information about Chronic Care Management services, agreed to services, and gave verbal consent prior to initiation of services.  Please see initial visit note for detailed documentation.   Patient Care Team: [name], MD as PCP - General (Internal Medicine) Milus Banister, MD as Attending Physician (Gastroenterology) Charlton Haws, [facility] as Pharmacist (Pharmacist)  Recent office visits: 10/02/20 Dr [name] OV: A1c 8.3 (from 8.7). c/o neck swelling/pain. Ordered US neck. Rx trazodone PRN for sleep. Pt declined adding DM meds, declined nutrition referral. Referred for LDCT. Offered change simvastatin to atorvastatin for high Trig, pt declined.  06/14/20 Dr [name] OV: chronic f/u. A1c 8.7. Continue metformin 1000 mg BID, increase simvastatin to 40 mg (whole tablet)  Recent consult visits: 10/29/20 NP Groce (pulmonary): lung cancer screening. Ct showed mass in RUL. Needs PET scan and PFTs.  Hospital visits: None in previous 6 months  Objective:  Lab Results  Component Value Date   CREATININE 0.98 10/02/2020   BUN 19 10/02/2020   GFR 77.05 10/02/2020   GFRNONAA >60 12/14/2007   GFRAA  12/14/2007    >60        The eGFR has been calculated using the MDRD equation. This calculation has not been validated in all clinical   NA 136 10/02/2020   K 4.1  10/02/2020   CALCIUM 9.5 10/02/2020   CO2 24 10/02/2020   GLUCOSE 169 (H) 10/02/2020    Lab Results  Component Value Date/Time   HGBA1C 8.3 (A) 10/02/2020 08:09 AM   HGBA1C 8.7 (H) 06/14/2020 10:16 AM   HGBA1C 9.0 (H) 01/27/2019 10:22 AM   GFR 77.05 10/02/2020 08:26 AM   GFR 79.15 06/14/2020 10:16 AM   MICROALBUR 1.4 06/14/2020 10:16 AM   MICROALBUR 0.8 08/11/2016 09:55 AM    Last diabetic Eye exam:  Lab Results  Component Value Date/Time   HMDIABEYEEXA No Retinopathy 06/28/2020 10:45 AM    Last diabetic Foot exam: No results found for: HMDIABFOOTEX   Lab Results  Component Value Date   CHOL 129 10/02/2020   HDL 35.30 (L) 10/02/2020   LDLCALC 50 11/22/2012   LDLDIRECT 62.0 10/02/2020   TRIG 258.0 (H) 10/02/2020   CHOLHDL 4 10/02/2020    Hepatic Function Latest Ref Rng & Units 10/02/2020 06/14/2020 01/27/2019  Total Protein 6.0 - 8.3 g/dL 7.5 7.1 7.6  Albumin 3.5 - 5.2 g/dL 4.7 4.6 4.6  AST 0 - 37 U/L '16 16 13  ' ALT 0 - 53 U/L '19 21 19  ' Alk Phosphatase 39 - 117 U/L 53 62 63  Total Bilirubin 0.2 - 1.2 mg/dL 0.5 0.4 0.5  Bilirubin, Direct 0.0 - 0.3 mg/dL - - -    Lab Results  Component Value Date/Time   TSH 1.22 06/22/2014 08:10 AM   TSH 1.14 08/24/2012 10:03 AM    CBC Latest Ref Rng & Units 06/14/2020 01/27/2019 08/11/2016  WBC 4.0 -  10.5 K/uL 8.1 8.7 8.9  Hemoglobin 13.0 - 17.0 g/dL 15.7 16.1 15.9  Hematocrit 39.0 - 52.0 % 45.9 47.3 45.8  Platelets 150.0 - 400.0 K/uL 213.0 224.0 214.0    No results found for: VD25OH  Clinical ASCVD: Yes  The ASCVD Risk score (Arnett DK, et al., 2019) failed to calculate for the following reasons:   The valid total cholesterol range is 130 to 320 mg/dL    Depression screen [facility] 2/9 06/14/2020 01/27/2019 09/02/2017  Decreased Interest 0 0 0  Down, Depressed, Hopeless 0 0 0  PHQ - 2 Score 0 0 0     Social History   Tobacco Use  Smoking Status Every Day   Packs/day: 2.00   Years: 58.00   Pack years: 116.00   Types: Cigarettes   Smokeless Tobacco Never  Tobacco Comments   smoked 1964- present, up to 2 ppd, 10/29/20   BP Readings from Last 3 Encounters:  10/02/20 124/80  06/14/20 132/76  01/27/19 120/80   Pulse Readings from Last 3 Encounters:  10/02/20 71  06/14/20 79  01/27/19 73   Wt Readings from Last 3 Encounters:  10/02/20 219 lb 6.4 oz (99.5 kg)  06/14/20 221 lb 6.4 oz (100.4 kg)  01/27/19 227 lb (103 kg)   BMI Readings from Last 3 Encounters:  10/02/20 29.76 kg/m  06/14/20 30.03 kg/m  01/27/19 30.79 kg/m    Assessment/Interventions: Review of patient past medical history, allergies, medications, health status, including review of consultants reports, laboratory and other test data, was performed as part of comprehensive evaluation and provision of chronic care management services.   SDOH:  (Social Determinants of Health) assessments and interventions performed: Yes  SDOH Screenings   Alcohol Screen: Not on file  Depression (PHQ2-9): Low Risk    PHQ-2 Score: 0  Financial Resource Strain: Not on file  Food Insecurity: Not on file  Housing: Not on file  Physical Activity: Not on file  Social Connections: Not on file  Stress: Not on file  Tobacco Use: High Risk   Smoking Tobacco Use: Every Day   Smokeless Tobacco Use: Never  Transportation Needs: Not on file    [place]  Allergies  Allergen Reactions   Penicillins     REACTION: throat swelling Because of a history of documented adverse serious drug reaction;[name] Alert bracelet  is recommended    Medications Reviewed Today     Reviewed by Magdalen Spatz, NP (Nurse Practitioner) on 10/29/20 at 1442  Med List Status: <None>   Medication Order Taking? Sig Documenting Provider Last Dose Status Informant  aspirin 81 MG tablet [id number] Yes Take 81 mg by mouth daily. [provider] Taking Active   empagliflozin (JARDIANCE) 25 MG TABS tablet [id number] Yes Take 1 tablet (25 mg total) by mouth daily. [name], MD Taking Active   glucose blood (FREESTYLE LITE) test strip [id number] Yes USE TO TEST BLOOD SUGAR TWICE DAILY ICD E11.49 Hendricks Limes, MD Taking Active   metFORMIN (GLUCOPHAGE) 1000 MG tablet [id number] Yes TAKE 1 TABLET BY MOUTH TWICE DAILY WITH A MEAL [name], MD Taking Active   metoprolol succinate (TOPROL-XL) 50 MG 24 hr tablet [id number] Yes TAKE 1 TABLET BY MOUTH EVERY DAY. [name], MD Taking Active   Multiple Vitamin (MULTIVITAMIN) tablet [id number] Yes Take 1 tablet by mouth daily. [provider] Taking Active   omeprazole (PRILOSEC) 20 MG capsule [id number] Yes TAKE 1 CAPSULE(20 MG) BY MOUTH TWICE DAILY BEFORE  A MEAL [name], MD Taking Active   simvastatin (ZOCOR) 40 MG tablet [id number] Yes Take 1 tablet (40 mg total) by mouth daily at 6 PM. [name], MD Taking Active   traZODone (DESYREL) 50 MG tablet [id number] Yes Take 1 tablet (50 mg total) by mouth at bedtime. [name], MD Taking Active   triamcinolone cream (KENALOG) 0.1 % [id number] Yes Apply 1 application topically 2 (two) times daily. [name], MD Taking Active             Patient Active Problem List   Diagnosis Date Noted   Insomnia 10/05/2020   Routine general medical examination at a health care facility 08/13/2016   Neck pain 11/26/2012   GERD (gastroesophageal reflux disease) 09/05/2010   Uncontrolled diabetes mellitus with complications ([place]) 72/11/4707   Smokers' cough ([place]) 09/25/2008   SKIN CANCER, HX OF 09/25/2008   Essential hypertension 03/20/2008   CAD (coronary artery disease) 03/20/2008   Hyperlipidemia associated with type 2 diabetes mellitus ([place]) 12/23/2007    Immunization History  Administered Date(s) Administered   Influenza, High Dose Seasonal PF 11/18/2017, 11/23/2018   Influenza,inj,Quad PF,6+ Mos 05/11/2015   Influenza-Unspecified 01/02/2013, 01/03/2014, 12/23/2018   PFIZER(Purple  Top)SARS-COV-2 Vaccination 06/14/2019, 07/10/2019, 02/21/2020   Pneumococcal Conjugate-13 09/02/2017   Tdap 11/18/2017   Zoster, Live 01/03/2014    Conditions to be addressed/monitored:  Hypertension, Hyperlipidemia, Diabetes, Coronary Artery Disease and Tobacco use  There are no care plans that you recently modified to display for this patient.    Medication Assistance:  Consider Jardiance PAP  Patient's preferred pharmacy is:  [id number] [place], [place] [address] [place] [address] Phone: [phone] Fax: [phone]  Uses pill box? Yes Pt endorses 100% compliance  We discussed: Current pharmacy is preferred with insurance plan and patient is satisfied with pharmacy services Patient decided to: Continue current medication management strategy  Care Plan and Follow Up Patient Decision:  Patient agrees to Care Plan and Follow-up.  Plan: Telephone follow up appointment with care management team member scheduled for:  3 months  Charlene Brooke, PharmD, [place], CPP Clinical Pharmacist [facility] [phone]   Current Barriers:  Unable to independently monitor therapeutic efficacy Unable to achieve control of diabetes   Pharmacist Clinical Goal(s):  Patient will achieve adherence to monitoring guidelines and medication adherence to achieve therapeutic efficacy achieve control of diabetes as evidenced by improved A1c < 8% through collaboration with PharmD and provider.   Interventions: 1:1 collaboration with [name], MD regarding development and update of comprehensive plan of care as evidenced by provider attestation and co-signature Inter-disciplinary care team collaboration (see longitudinal plan of care) Comprehensive medication review performed; medication list updated in electronic medical record  Hypertension    BP goal < 130/80 Patient checks  BP at home never Patient home BP readings are ranging: n/a   Patient has failed these meds in the past: n/a Patient is currently controlled on the following medications:  metoprolol succinate 50 mg daily   We discussed:  BP goals; benefits if medication     Plan: Continue current medications and control with diet and exercise    Hyperlipidemia / CAD    LDL goal < 70 CAD: NSTEMI 2009, medically managed, no stents.  Patient has failed these meds in past: n/a Patient is currently controlled on the following medications:  simvastatin  40 mg HS aspirin 81 mg daily   We discussed:  diet and exercise extensively; Cholesterol goals; benefits of statin for ASCVD risk reduction; last PCP visit pt decided to make diet/lifestyle changes rather than medication changes to improve cholesterol; however today pt admits his diet has not changed much since last year; reiterated importance of diet/exercise to achieve cholesterol goals and prevent recurrent ASCVD   Plan Continue current medications and control with diet and exercise    Diabetes    A1c goal < 8% Checking BG: Never   Patient has failed these meds in past: n/a Patient is currently uncontrolled on the following medications:  metformin 1000 mg BID Jardiance 25 mg daily   We discussed: pt saw PCP last month and gave the impression he had stopped taking Jardiance - this is not the case, he just did not want to increase the dose; counseled patient that he is currently taking highest doses of metformin and Jardiance; he reports he has made a "drastic" change in his diet to limit sugars and declines to add a new med until A1c is rechecked in July; reiterated A1c goal and benefits of exercise   Plan Continue current medications  Increase exercise - target 150 min/week Reduce sugar/carbs in diet   Tobacco Use    Patient has failed these meds in past: none Previous quit attempts: Quit for 8-9 months about 20 yrs ago, cold [place]   We  discussed:  smoking cessation, benefits of using NRT or Chantix, bupropion for cravings. Answered questions about side effects of Chantix. Patient does not think now is a good time to quit smoking while he is working on diet changes for DM   Plan: Patient will let me know if he gets more serious about quitting.    Patient Goals/Self-Care Activities Patient will:  - take medications as prescribed focus on medication adherence by routine target a minimum of 150 minutes of moderate intensity exercise weekly engage in dietary modifications by reducing sugar, carbs

## 2020-11-28 ENCOUNTER — Ambulatory Visit (HOSPITAL_COMMUNITY)
Admission: RE | Admit: 2020-11-28 | Discharge: 2020-11-28 | Disposition: A | Discharge status: Home / Self Care | Payer: Medicare Other | Source: Ambulatory Visit | Attending: Acute Care | Admitting: Acute Care

## 2020-11-28 ENCOUNTER — Telehealth: Payer: Self-pay | Admitting: Acute Care

## 2020-11-28 ENCOUNTER — Other Ambulatory Visit: Payer: Self-pay

## 2020-11-28 DIAGNOSIS — I2510 Atherosclerotic heart disease of native coronary artery without angina pectoris: Secondary | ICD-10-CM | POA: Insufficient documentation

## 2020-11-28 DIAGNOSIS — J439 Emphysema, unspecified: Secondary | ICD-10-CM | POA: Diagnosis not present

## 2020-11-28 DIAGNOSIS — R911 Solitary pulmonary nodule: Secondary | ICD-10-CM | POA: Diagnosis not present

## 2020-11-28 DIAGNOSIS — N281 Cyst of kidney, acquired: Secondary | ICD-10-CM | POA: Diagnosis not present

## 2020-11-28 DIAGNOSIS — D179 Benign lipomatous neoplasm, unspecified: Secondary | ICD-10-CM | POA: Diagnosis not present

## 2020-11-28 DIAGNOSIS — I700 Atherosclerosis of aorta: Secondary | ICD-10-CM | POA: Insufficient documentation

## 2020-11-28 LAB — GLUCOSE, CAPILLARY: Glucose-Capillary: 183 mg/dL — ABNORMAL HIGH (ref 70–99)

## 2020-11-28 MED ORDER — FLUDEOXYGLUCOSE F - 18 (FDG) INJECTION
10.9500 | Freq: Once | INTRAVENOUS | Status: AC | PRN
  Administered 2020-11-28: 10.92 via INTRAVENOUS

## 2020-11-28 NOTE — Telephone Encounter (Signed)
I have called and spoke with pt and he is aware that once the PET scan results come back SG will be in contact with him

## 2020-12-03 ENCOUNTER — Telehealth: Payer: Self-pay | Admitting: Pharmacist

## 2020-12-03 NOTE — Progress Notes (Signed)
I have called the patient with the results of his PET scan. I explained that the nodule of concern in his right lower lobe was PET avid , and is concerning for eithr malignancy or granulomatous infection. I explained that recommendation is for tissue sampling. I explained that I will get the patient scheduled with either Dr. Valeta Harms or Lamonte Sakai, whoever has the quickest opening for follow up to discuss options. The patient verbalized understanding and had no further questions. He is in agreement with this plan.

## 2020-12-03 NOTE — Progress Notes (Signed)
    Chronic Care Management Pharmacy Assistant   Name: Brandon Rubio. MRN: [id number] DOB: [birth year]  Reason for Encounter: Disease State - General Adherence  Recent office visits:  10/02/20 [name] (PCP) - Uncontrolled diabetes mellitus with complications. Start Trazodone HCl 50 mg.  Recent consult visits:  10/29/20 Groce (Pulmonary) - Encounter for screening for lung cancer. No med changes.  Hospital visits:  None in previous 6 months  Medications: Outpatient Encounter Medications as of 12/03/2020  Medication Sig   aspirin 81 MG tablet Take 81 mg by mouth daily.   empagliflozin (JARDIANCE) 25 MG TABS tablet Take 1 tablet (25 mg total) by mouth daily.   glucose blood (FREESTYLE LITE) test strip USE TO TEST BLOOD SUGAR TWICE DAILY ICD E11.49   metFORMIN (GLUCOPHAGE) 1000 MG tablet TAKE 1 TABLET BY MOUTH TWICE DAILY WITH A MEAL   metoprolol succinate (TOPROL-XL) 50 MG 24 hr tablet TAKE 1 TABLET BY MOUTH EVERY DAY.   Multiple Vitamin (MULTIVITAMIN) tablet Take 1 tablet by mouth daily.   omeprazole (PRILOSEC) 20 MG capsule TAKE 1 CAPSULE(20 MG) BY MOUTH TWICE DAILY BEFORE A MEAL   simvastatin (ZOCOR) 40 MG tablet Take 1 tablet (40 mg total) by mouth daily at 6 PM.   traZODone (DESYREL) 50 MG tablet Take 1 tablet (50 mg total) by mouth at bedtime.   triamcinolone cream (KENALOG) 0.1 % Apply 1 application topically 2 (two) times daily.   No facility-administered encounter medications on file as of 12/03/2020.    Have you had any problems recently with your health? Patient states they found a spot on his lung and he is waiting on the results of his PET scan to come back.  Have you had any problems with your pharmacy? Patient states no problems with current pharmacy.   What issues or side effects are you having with your medications? Patient states no side effects or issues with meds.   What would you like me to pass along to [facility] for them to help you with?   Patient states that he stopped taking the Trazodone 50 mg because it was only giving him an extra hour of sleep. He states that he wants to take more than one to see if that will help, I advised him to take as prescribed and I will forward this information to [place].   What can we do to take care of you better? Patient states he had two appointments that was canceled and no one notified him, he states he would like to be notified when this happens.   Star Rating Drugs: Jardiance - filled 11/11/20 90D Metformin - filled 09/14/20 90D Simvastatin - filled 10/02/20 [place], RMA Clinical Pharmacists Assistant [phone]  Time Spent: 91

## 2020-12-05 ENCOUNTER — Other Ambulatory Visit: Payer: Self-pay

## 2020-12-05 ENCOUNTER — Encounter: Payer: Self-pay | Admitting: Emergency Medicine

## 2020-12-05 ENCOUNTER — Telehealth: Payer: Self-pay | Admitting: Emergency Medicine

## 2020-12-05 ENCOUNTER — Ambulatory Visit (INDEPENDENT_AMBULATORY_CARE_PROVIDER_SITE_OTHER): Payer: Medicare Other | Admitting: Emergency Medicine

## 2020-12-05 VITALS — BP 130/70 | HR 83 | Temp 98.1°F | Ht 72.0 in | Wt 218.0 lb

## 2020-12-05 DIAGNOSIS — I2510 Atherosclerotic heart disease of native coronary artery without angina pectoris: Secondary | ICD-10-CM | POA: Diagnosis not present

## 2020-12-05 DIAGNOSIS — R918 Other nonspecific abnormal finding of lung field: Secondary | ICD-10-CM

## 2020-12-05 DIAGNOSIS — Z720 Tobacco use: Secondary | ICD-10-CM | POA: Diagnosis not present

## 2020-12-05 NOTE — Patient Instructions (Signed)
We will work on setting up bronchoscopy to evaluate your right lung abnormality.  This would be done under general anesthesia as an outpatient at [facility].  We will try to get this set up for 12/10/2020 in the morning.  You will need a designated driver and someone to stay with you at home after the case.  Alternatively we could admit you for observation overnight and you can go home on 12/11/2020. Follow with Dr Lamonte Sakai in 1 month

## 2020-12-05 NOTE — Assessment & Plan Note (Signed)
Asymptomatic right lower lobe mass with hypermetabolism on PET scan.  Mixed density.  Very suspicious for primary malignancy, probably adenocarcinoma.  Discussed the options with him this morning and I have recommended bronchoscopy and tissue diagnosis is soon as we can arrange it.  He agrees.  He will need a dedicated super D CT in order to facilitate navigation.  He will have to stop his aspirin 2 days prior.

## 2020-12-05 NOTE — Telephone Encounter (Signed)
I scheduled for 10/3 at 7:30 at [facility].  CT to be done tomorrow at [facility].  Spoke to [place] and she will have disk sent to [facility].  Pt to go for covid test on 9/30.  Gave all appt info to pt.  Told him to hold aspirin for 2 days prior.

## 2020-12-05 NOTE — H&P (View-Only) (Signed)
Subjective:    Patient ID: Brandon Rubio., male    DOB: 1950-06-28, 70 y.o.   MRN: [id number]  HPI 70 year old male with history of tobacco use (116 pack years), CAD, diabetes, hypertension, had a spontaneous PTX in his 20's, required a chest tube.  He is referred today for evaluation of an abnormal CT scan of the chest. He began to dissipate in lung cancer screening program 2022, screening CT done 11/02/2020.  Abnormality prompted PET scan as below. He describes throat irritation, pain that has been a problem for several years. He has seen ENT with no clear cause identified. It has been progressively worse. He is using hydrocodone for it. No dysphagia. No cough. Will have some SOB with walking 1/8 mile, stairs. Able to do his ADL. Able to shop.   Screening CT chest 11/02/2020 reviewed by me, shows moderate centrilobular emphysematous change, large semisolid rounded mass in the posterior right lower lobe 7.2 x 5.7 cm.  No other perceived abnormalities  PET scan 11/28/2020 reviewed by me confirms rounded peripheral region of consolidation with some internal air bronchograms in the right lower lobe, hypermetabolic with SUV 6.5.  There is some mild hypermetabolism associated with some increasing bandlike interstitial accentuation in the posterior right lower lobe that is new from the CT 11/02/2020.  There is no evidence of any adenopathy or distant disease.  PFT performed 11/15/20 reviewed by me    Review of Systems As per [facility]  Past Medical History:  Diagnosis Date   CAD (coronary artery disease)    Cancer ([place])    SKIN.[name]   Cataract    Diabetes mellitus    2   GERD (gastroesophageal reflux disease)    Hyperlipidemia    Hypertension    Myocardial infarct, old    2003   Peripheral neuropathy    hands     Family History  Problem Relation Age of Onset   Colon polyps Father    Colon cancer Father 12   Coronary artery disease Father    Lung disease Father    Lung cancer  Mother        X 2     Social History   Socioeconomic History   Marital status: Divorced    Spouse name: Not on file   Number of children: Not on file   Years of education: Not on file   Highest education level: Not on file  Occupational History   Not on file  Tobacco Use   Smoking status: Every Day    Packs/day: 2.00    Years: 58.00    Pack years: 116.00    Types: Cigarettes   Smokeless tobacco: Never   Tobacco comments:    2+ packs smoked daily ARJ 12/05/20  Vaping Use   Vaping Use: Never used  Substance and Sexual Activity   Alcohol use: No    Alcohol/week: 0.0 standard drinks    Comment:  none since 04/2012   Drug use: No   Sexual activity: Not on file  Other Topics Concern   Not on file  Social History Narrative   REG EXERCISE         Social Determinants of Health   Financial Resource Strain: Not on file  Food Insecurity: Not on file  Transportation Needs: Not on file  Physical Activity: Not on file  Stress: Not on file  Social Connections: Not on file  Intimate Partner Violence: Not on file    Was in the [employer], Dealer  Engineer, 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     empagliflozin (JARDIANCE) 25 MG TABS tablet Take 1 tablet (25 mg total) by mouth daily. 90 tablet 3   glucose blood (FREESTYLE LITE) test strip USE TO TEST BLOOD SUGAR TWICE DAILY ICD E11.49 100 each 3   metFORMIN (GLUCOPHAGE) 1000 MG tablet TAKE 1 TABLET BY MOUTH TWICE DAILY WITH A MEAL 180 tablet 1   metoprolol succinate (TOPROL-XL) 50 MG 24 hr tablet TAKE 1 TABLET BY MOUTH EVERY DAY. 90 tablet 0   Multiple Vitamin (MULTIVITAMIN) tablet Take 1 tablet by mouth  daily.     omeprazole (PRILOSEC) 20 MG capsule TAKE 1 CAPSULE(20 MG) BY MOUTH TWICE DAILY BEFORE A MEAL 180 capsule 3   simvastatin (ZOCOR) 40 MG tablet Take 1 tablet (40 mg total) by mouth daily at 6 PM. 90 tablet 3   traZODone (DESYREL) 50 MG tablet Take 1 tablet (50 mg total) by mouth at bedtime. 90 tablet 3   triamcinolone cream (KENALOG) 0.1 % Apply 1 application topically 2 (two) times daily. 30 g 0   No facility-administered medications prior to visit.          Objective:   Physical Exam Vitals:   12/05/20 0833  BP: 130/70  Pulse: 83  Temp: 98.1 F (36.7 C)  TempSrc: Oral  SpO2: 94%  Weight: 218 lb (98.9 kg)  Height: 6' (1.829 m)    Gen: Pleasant, obese man, in no distress,  normal affect  ENT: No lesions,  mouth clear,  oropharynx clear, no postnasal drip  Neck: No JVD, no stridor  Lungs: No use of accessory muscles, focal right basilar inspiratory crackles, no wheeze  Cardiovascular: RRR, heart sounds normal, no murmur or gallops, no peripheral edema  Musculoskeletal: No deformities, no cyanosis or clubbing  Neuro: alert, awake, non focal  Skin: Warm, no lesions or rash      Assessment & Plan:  Right lower lobe lung mass Asymptomatic right lower lobe mass with hypermetabolism on PET scan.  Mixed density.  Very suspicious for primary malignancy, probably adenocarcinoma.  Discussed the options with him this morning and I have recommended bronchoscopy and tissue diagnosis is soon as we can arrange it.  He agrees.  He will need a dedicated super D CT in order to facilitate navigation.  He will have to stop his aspirin 2 days prior.  Tobacco use We will need to push cessation efforts going forward.   Baltazar Apo, MD, PhD 12/05/2020, 9:10 AM [facility] [phone] or if no answer before 7:00PM call [phone] For any issues after 7:00PM please call eLink [phone]

## 2020-12-05 NOTE — Assessment & Plan Note (Signed)
We will need to push cessation efforts going forward.

## 2020-12-05 NOTE — Progress Notes (Signed)
Subjective:    Patient ID: Brandon Rubio., male    DOB: [birth year], 70 y.o.   MRN: [id number]  HPI 70 year old male with history of tobacco use (116 pack years), CAD, diabetes, hypertension, had a spontaneous PTX in his 20's, required a chest tube.  He is referred today for evaluation of an abnormal CT scan of the chest. He began to dissipate in lung cancer screening program 2022, screening CT done 11/02/2020.  Abnormality prompted PET scan as below. He describes throat irritation, pain that has been a problem for several years. He has seen ENT with no clear cause identified. It has been progressively worse. He is using hydrocodone for it. No dysphagia. No cough. Will have some SOB with walking 1/8 mile, stairs. Able to do his ADL. Able to shop.   Screening CT chest 11/02/2020 reviewed by me, shows moderate centrilobular emphysematous change, large semisolid rounded mass in the posterior right lower lobe 7.2 x 5.7 cm.  No other perceived abnormalities  PET scan 11/28/2020 reviewed by me confirms rounded peripheral region of consolidation with some internal air bronchograms in the right lower lobe, hypermetabolic with SUV 6.5.  There is some mild hypermetabolism associated with some increasing bandlike interstitial accentuation in the posterior right lower lobe that is new from the CT 11/02/2020.  There is no evidence of any adenopathy or distant disease.  PFT performed 11/15/20 reviewed by me    Review of Systems As per [facility]  Past Medical History:  Diagnosis Date   CAD (coronary artery disease)    Cancer ([place])    SKIN.[name]   Cataract    Diabetes mellitus    2   GERD (gastroesophageal reflux disease)    Hyperlipidemia    Hypertension    Myocardial infarct, old    2003   Peripheral neuropathy    hands     Family History  Problem Relation Age of Onset   Colon polyps Father    Colon cancer Father 16   Coronary artery disease Father    Lung disease Father    Lung cancer  Mother        X 2     Social History   Socioeconomic History   Marital status: Divorced    Spouse name: Not on file   Number of children: Not on file   Years of education: Not on file   Highest education level: Not on file  Occupational History   Not on file  Tobacco Use   Smoking status: Every Day    Packs/day: 2.00    Years: 58.00    Pack years: 116.00    Types: Cigarettes   Smokeless tobacco: Never   Tobacco comments:    2+ packs smoked daily ARJ 12/05/20  Vaping Use   Vaping Use: Never used  Substance and Sexual Activity   Alcohol use: No    Alcohol/week: 0.0 standard drinks    Comment:  none since 04/2012   Drug use: No   Sexual activity: Not on file  Other Topics Concern   Not on file  Social History Narrative   REG EXERCISE         Social Determinants of Health   Financial Resource Strain: Not on file  Food Insecurity: Not on file  Transportation Needs: Not on file  Physical Activity: Not on file  Stress: Not on file  Social Connections: Not on file  Intimate Partner Violence: Not on file    Was in the [employer], Dealer  Engineer, 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     empagliflozin (JARDIANCE) 25 MG TABS tablet Take 1 tablet (25 mg total) by mouth daily. 90 tablet 3   glucose blood (FREESTYLE LITE) test strip USE TO TEST BLOOD SUGAR TWICE DAILY ICD E11.49 100 each 3   metFORMIN (GLUCOPHAGE) 1000 MG tablet TAKE 1 TABLET BY MOUTH TWICE DAILY WITH A MEAL 180 tablet 1   metoprolol succinate (TOPROL-XL) 50 MG 24 hr tablet TAKE 1 TABLET BY MOUTH EVERY DAY. 90 tablet 0   Multiple Vitamin (MULTIVITAMIN) tablet Take 1 tablet by mouth  daily.     omeprazole (PRILOSEC) 20 MG capsule TAKE 1 CAPSULE(20 MG) BY MOUTH TWICE DAILY BEFORE A MEAL 180 capsule 3   simvastatin (ZOCOR) 40 MG tablet Take 1 tablet (40 mg total) by mouth daily at 6 PM. 90 tablet 3   traZODone (DESYREL) 50 MG tablet Take 1 tablet (50 mg total) by mouth at bedtime. 90 tablet 3   triamcinolone cream (KENALOG) 0.1 % Apply 1 application topically 2 (two) times daily. 30 g 0   No facility-administered medications prior to visit.          Objective:   Physical Exam Vitals:   12/05/20 0833  BP: 130/70  Pulse: 83  Temp: 98.1 F (36.7 C)  TempSrc: Oral  SpO2: 94%  Weight: 218 lb (98.9 kg)  Height: 6' (1.829 m)    Gen: Pleasant, obese man, in no distress,  normal affect  ENT: No lesions,  mouth clear,  oropharynx clear, no postnasal drip  Neck: No JVD, no stridor  Lungs: No use of accessory muscles, focal right basilar inspiratory crackles, no wheeze  Cardiovascular: RRR, heart sounds normal, no murmur or gallops, no peripheral edema  Musculoskeletal: No deformities, no cyanosis or clubbing  Neuro: alert, awake, non focal  Skin: Warm, no lesions or rash      Assessment & Plan:  Right lower lobe lung mass Asymptomatic right lower lobe mass with hypermetabolism on PET scan.  Mixed density.  Very suspicious for primary malignancy, probably adenocarcinoma.  Discussed the options with him this morning and I have recommended bronchoscopy and tissue diagnosis is soon as we can arrange it.  He agrees.  He will need a dedicated super D CT in order to facilitate navigation.  He will have to stop his aspirin 2 days prior.  Tobacco use We will need to push cessation efforts going forward.   Baltazar Apo, MD, PhD 12/05/2020, 9:10 AM [facility] [phone] or if no answer before 7:00PM call [phone] For any issues after 7:00PM please call eLink [phone]

## 2020-12-05 NOTE — Telephone Encounter (Signed)
Pt called in to let us know he now has someone to stay with him for the bronch and will be able to take him home.  He will not need to be admitted for observation.

## 2020-12-06 ENCOUNTER — Encounter (HOSPITAL_COMMUNITY): Payer: Self-pay | Admitting: Emergency Medicine

## 2020-12-06 ENCOUNTER — Other Ambulatory Visit: Payer: Self-pay

## 2020-12-06 ENCOUNTER — Ambulatory Visit (HOSPITAL_COMMUNITY)
Admission: RE | Admit: 2020-12-06 | Discharge: 2020-12-06 | Disposition: A | Discharge status: Home / Self Care | Payer: Medicare Other | Source: Ambulatory Visit | Attending: Emergency Medicine | Admitting: Emergency Medicine

## 2020-12-06 DIAGNOSIS — I700 Atherosclerosis of aorta: Secondary | ICD-10-CM | POA: Diagnosis not present

## 2020-12-06 DIAGNOSIS — R918 Other nonspecific abnormal finding of lung field: Secondary | ICD-10-CM | POA: Insufficient documentation

## 2020-12-06 DIAGNOSIS — J439 Emphysema, unspecified: Secondary | ICD-10-CM | POA: Diagnosis not present

## 2020-12-06 IMAGING — CT CT CHEST SUPER D W/O CM
2 of 4 series · 15 of 36 positions shown, 18 images · non-contrast
Comparison: PET of [DATE].  Chest CT of [DATE]

CLINICAL DATA: Follow-up of right lower lobe lung mass.

EXAM:
CT CHEST WITHOUT CONTRAST
TECHNIQUE: Multidetector CT imaging of the chest was performed using thin slice
collimation for electromagnetic bronchoscopy planning purposes,
without intravenous contrast.

[Series 5: lungs · axial · 0.75mm/px · z∈[-225,+71]mm · 12 of 166 slices shown, 15 images]
[im 9/166  mediastinal]
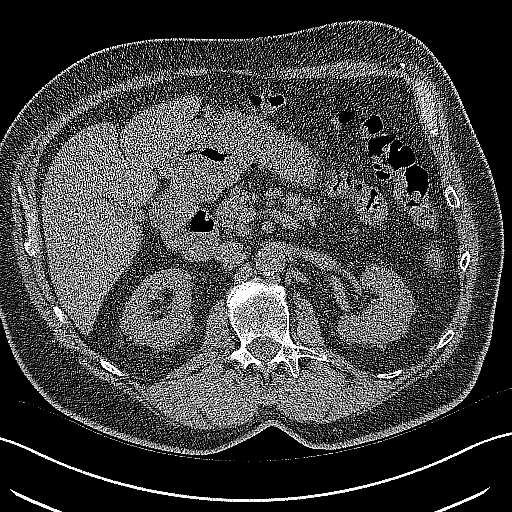
[im 9/166  lung]
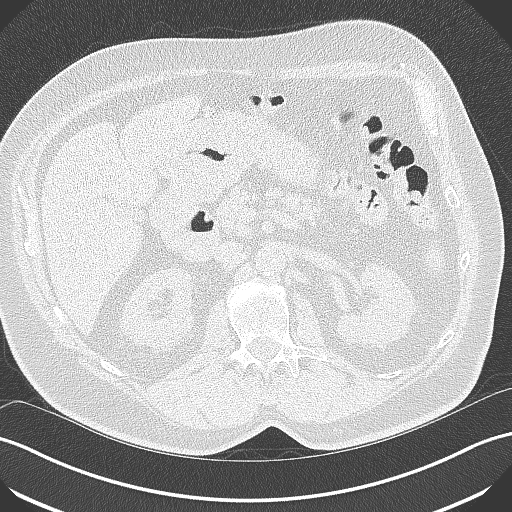
[im 27/166  lung]
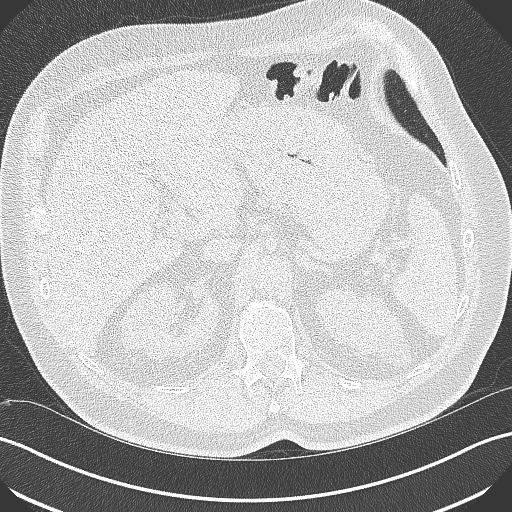
[im 35/166  lung]
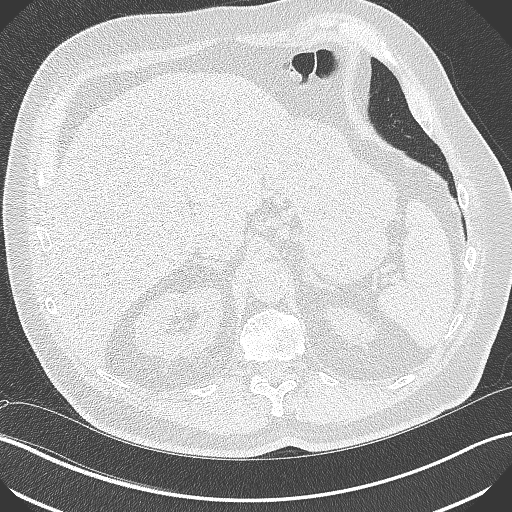
[im 53/166  lung]
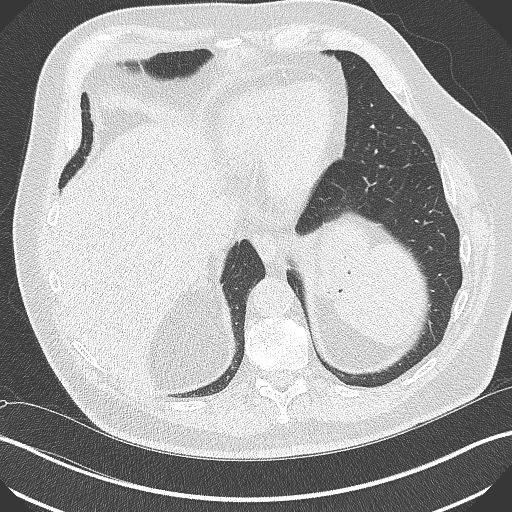
[im 61/166  mediastinal]
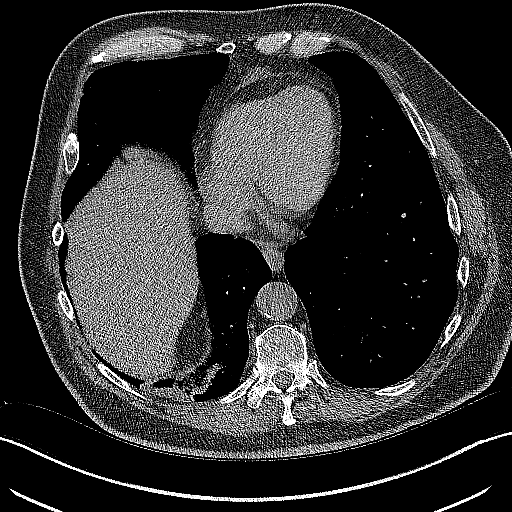
[im 61/166  lung]
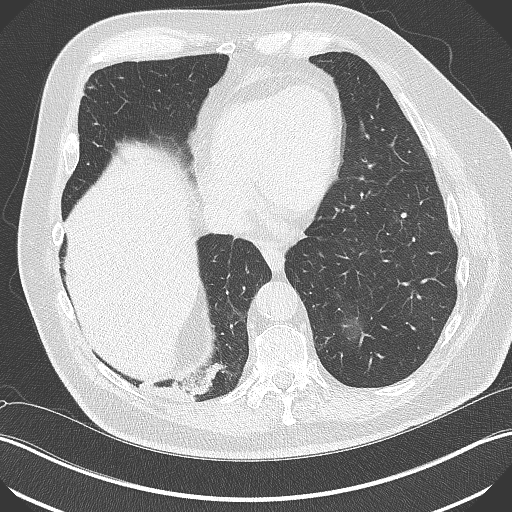
[im 79/166  lung]
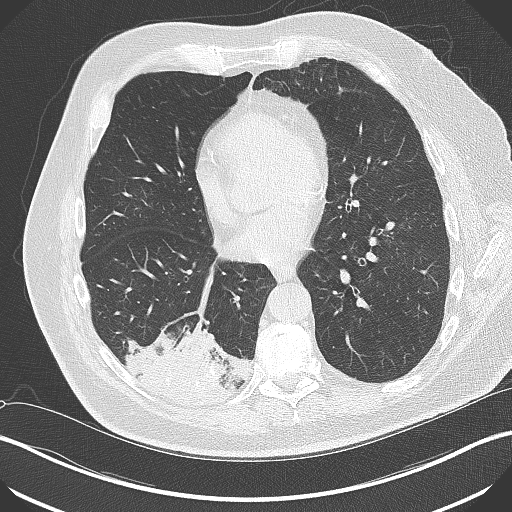
[im 87/166  lung]
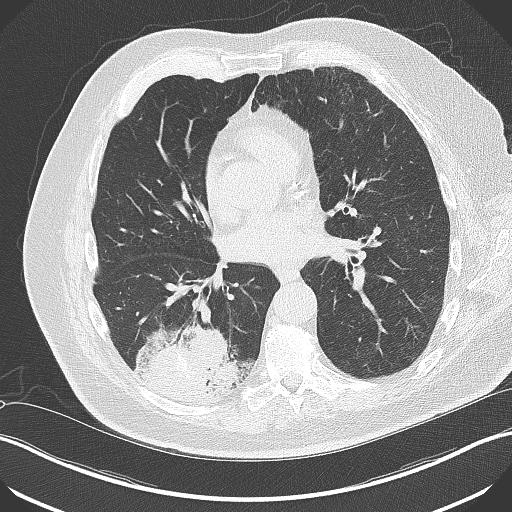
[im 105/166  lung]
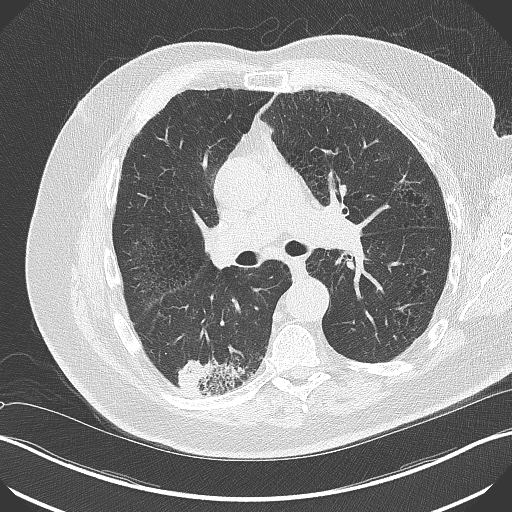
[im 113/166  mediastinal]
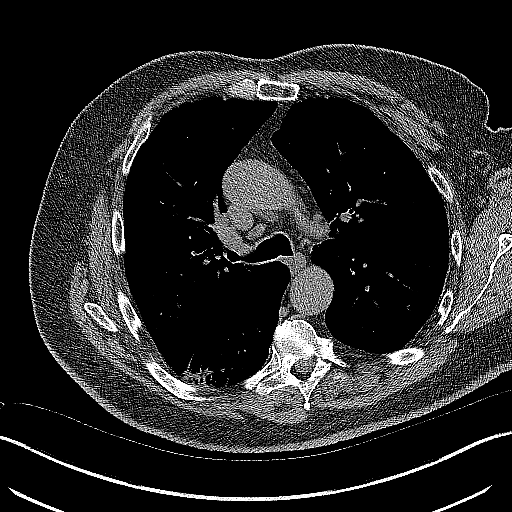
[im 113/166  lung]
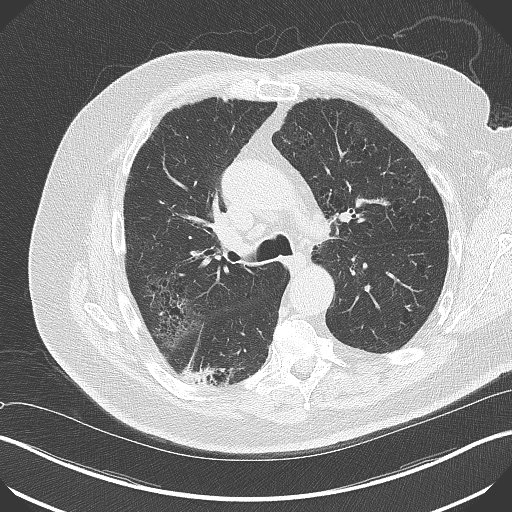
[im 131/166  lung]
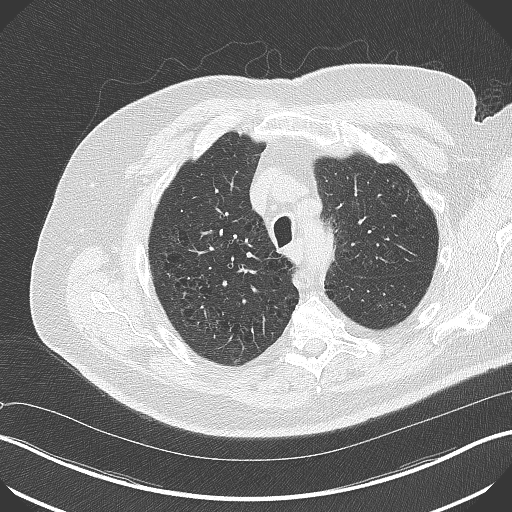
[im 139/166  lung]
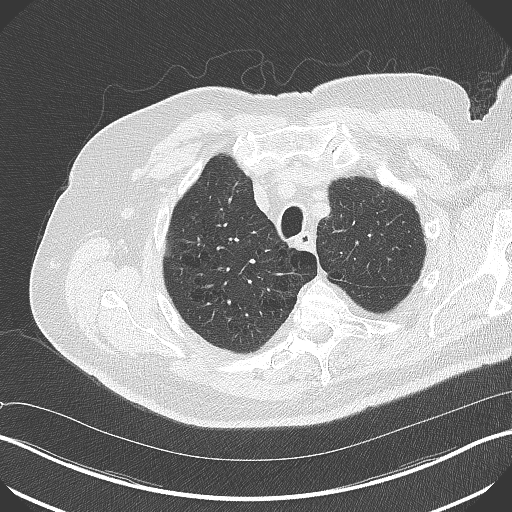
[im 157/166  lung]
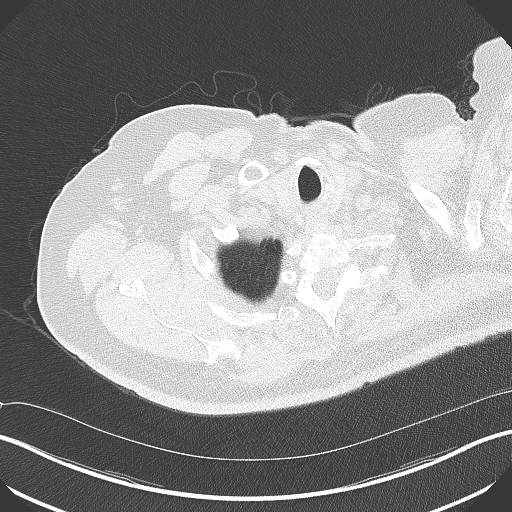

[Series 6: coronal · coronal · 0.71mm/px · 3 of 170 slices shown]
[im 34/170  lung]
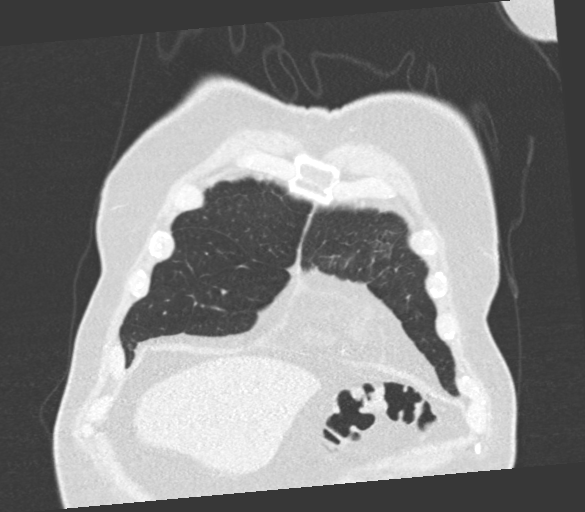
[im 68/170  lung]
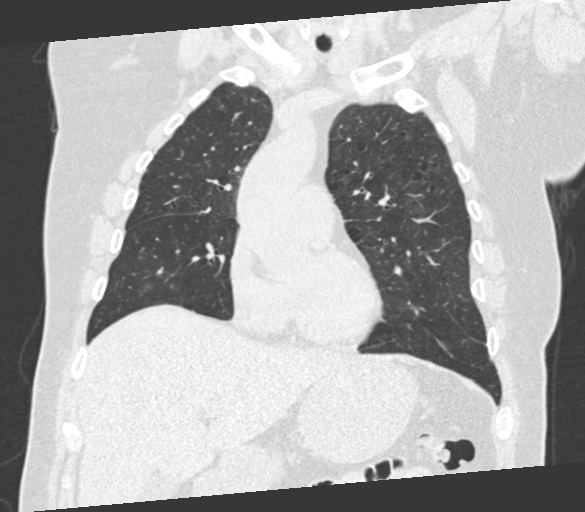
[im 102/170  lung]
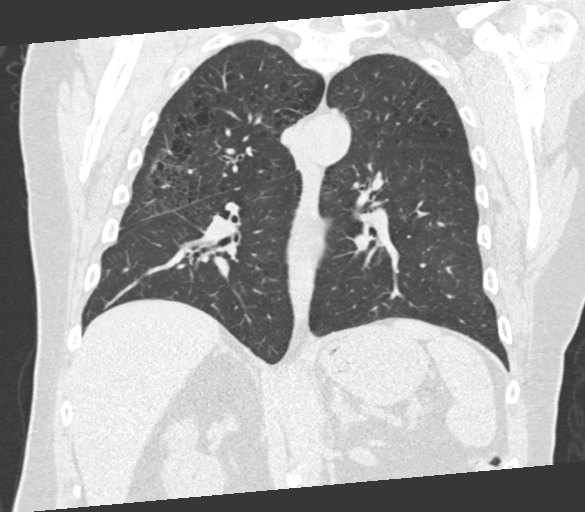

[15 of 36 positions shown; findings below may reference images not displayed]

FINDINGS: Cardiovascular: Aortic atherosclerosis. Normal heart size, without
pericardial effusion. Left main and 3 vessel coronary artery
calcification.

Mediastinum/Nodes: No mediastinal or definite hilar adenopathy,
given limitations of unenhanced CT.

Lungs/Pleura: No pleural fluid. Moderate centrilobular and
paraseptal emphysema.

Nodule along the right minor fissure is similar at 6 mm on 69/5,
likely a subpleural lymph node.

Right lower lobe mixed attenuation mass is again identified. At
greatest transverse dimension, the solid component measures 8.4 x
6.5 cm on 85/5 versus 7.3 x 6.0 cm at the same level on [DATE]
(when remeasured). The extent of surrounding ground-glass opacity is
relatively similar. Based on coronal reformats, the solid component
measures 6.4 x 8.2 cm on 137/6 versus 7.1 x 6.9 cm on the prior
exam.

Upper Abdomen: Normal imaged portions of the liver, spleen,
pancreas, gallbladder, adrenal glands, right kidney. Upper pole left
renal 3.4 cm low-density lesion is likely a cyst.

The proximal stomach is underdistended, but thick walled including
at 3.1 cm on 56/2. Not significantly hypermetabolic on prior PET.

Musculoskeletal: No acute osseous abnormality.
IMPRESSION: 1. Persistent right lower lobe mixed attenuation lung mass, felt to
be slightly enlarged compared to the CT of 1 month ago. This remains
suspicious for primary bronchogenic carcinoma, likely
adenocarcinoma. Given morphology, inflammatory etiologies such as
organizing pneumonia are also considered.
2. No thoracic adenopathy.
3. Gastric wall thickening in the setting of underdistention.
Correlate with symptoms of gastritis or other gastric pathology.
Consider endoscopy.
4. Aortic atherosclerosis ([QS]-[QS]), coronary artery
atherosclerosis and emphysema ([QS]-[QS]).

## 2020-12-06 NOTE — Progress Notes (Signed)
PCP - Dr. [name] Cardiologist - denies EKG - DOS Chest x-ray -  ECHO -  Cardiac Cath -  CPAP -   Fasting Blood Sugar:  150s Checks Blood Sugar:  does not check CBG   Blood Thinner Instructions:  Aspirin Instructions: pt had instructions to stop ASA 2 days prior to surgery  ERAS Protcol -   COVID TEST- 12/07/20 at covid testing site  Anesthesia review: yes  -------------  SDW INSTRUCTIONS:  Your procedure is scheduled on 10/3 . Please report to [facility] Main Entrance "A" at 0530 A.M., and check in at the Admitting office. Call this number if you have problems the morning of surgery: [phone]   Remember: Do not eat or drink after midnight the night before your surgery   Medications to take morning of surgery with a sip of water include: metoprolol succinate (TOPROL-XL)  omeprazole (PRILOSEC)  As of today, STOP taking any Aleve, Naproxen, Ibuprofen, Motrin, Advil, Goody's, BC's, all herbal medications, fish oil, and all vitamins.    The Morning of Surgery Do not wear jewelry Do not wear lotions, powders, colognes, or deodorant  Men may shave face and neck. Do not bring valuables to the hospital. [facility] is not responsible for any belongings or valuables.  If you are a smoker, DO NOT Smoke 24 hours prior to surgery  If you wear a CPAP at night please bring your mask the morning of surgery   Remember that you must have someone to transport you home after your surgery, and remain with you for 24 hours if you are discharged the same day.  Please bring cases for contacts, glasses, hearing aids, dentures or bridgework because it cannot be worn into surgery.   Patients discharged the day of surgery will not be allowed to drive home.   Please shower the NIGHT BEFORE/MORNING OF SURGERY (use antibacterial soap like DIAL soap if possible). Wear comfortable clothes the morning of surgery. Oral Hygiene is also important to reduce your risk of infection.  Remember -  BRUSH YOUR TEETH THE MORNING OF SURGERY WITH YOUR REGULAR TOOTHPASTE  Patient denies shortness of breath, fever, cough and chest pain.

## 2020-12-07 ENCOUNTER — Other Ambulatory Visit: Payer: Self-pay | Admitting: Emergency Medicine

## 2020-12-07 ENCOUNTER — Encounter (HOSPITAL_COMMUNITY): Payer: Self-pay | Admitting: Emergency Medicine

## 2020-12-07 NOTE — Progress Notes (Signed)
Anesthesia Chart Review: SAME DAY WORK-UP  Case: [id number] Date/Time: 12/10/20 0730   Procedure: ROBOTIC VIDEO BRONCHOSCOPY WITH ENDOBRONCHIAL NAVIGATION   Anesthesia type: General   Pre-op diagnosis: right lower lope mass   Location: [facility] ROOM 3 / [facility]   Surgeons: Collene Gobble, MD       DISCUSSION: Patient is a 70 year old male scheduled for the above procedure. Patient seen by Dr. Lamonte Sakai on 12/05/20 for RLL lung lesion (found on lung cancer screening CT) with hypermetabolism on PET scan. He is a smoker. A bronchoscopy for tissue diagnosis is recommended.  Smoking cessation encouraged.  History includes smoking, CAD (NSTEMI 12/11/07, LHC 12/13/07 99% dLAD, 40% Ramus, 50% PDA, irregularities RCA, medical therapy given small dLAD), HTN, HLD, DM2, peripheral neuropathy (hands), GERD, DVT (right PT DVT 03/30/01, post right ankle fracture), skin cancer ([facility]), RLL lung lesion, spontaneous PTX (age 70's).   He last saw cardiologist Eustace Quail, MD on 10/04/08. He advised that patient could discontinue Plavix, but continue ASA 81 mg. Since stents were not placed, he said patient could be as needed cardiology follow-up with continued secondary prevention measures through primary care.   PCP Dr. [name] is aware of plans for bronchoscopy. Last office visit 10/05/20 for follow-up DM and hypercholesterolemia. He had reported some neck pain and subjective swelling and was concerned because his brother had throat cancer. US neck ordered, as well as lung cancer screening CT given smoking with "smokers' cough". Neck US was unremarkable but chest CT showed a RLL lung lesion. A1c 8.3%, down from 8.7%.  Cholesterol not yet at goal, but had increased Lipitor to 40 mg 3 months prior. He did not want to make any additional medication changes at that time.  No chest pain or SOB per PAT RN phone interview. Per Dr. Lamonte Sakai, patient does have DOE with activities such as walking 1/8 mile and stairs but is  able to do ADLs, shop.  Will have some SOB with walking 1/8 mile, stairs. Able to do his ADL. Able to shop.    He reported instructions to hold aspirin 2 days prior to surgery.   Reviewed cased with anesthesiologist Andres Shad, MD. Patient will get EKG and labs on arrival with anesthesia team evaluation. Definitive plan at that time, but anticipating that if no worrisome CV symptoms or EKG changes then he could proceed as planned; however, should he require VATS/thoracotomy type procedure in the future then would anticipate need for cardiology evaluation. I will communicate this with Dr. Lamonte Sakai and Dr. [name].    Reportedly, pre-procedure COVID-19 testing is planned for 12/07/20.    VS: Ht 6' (1.829 m)   Wt 98.4 kg   BMI 29.43 kg/m  BP Readings from Last 3 Encounters:  12/05/20 130/70  10/02/20 124/80  06/14/20 132/76   Pulse Readings from Last 3 Encounters:  12/05/20 83  10/02/20 71  06/14/20 79      PROVIDERS: [name], MD is PCP  Eustace Quail, MD was cardiologist. As needed follow-up recommended at 10/04/08 visit, as stents were not placed and he felt continued secondary prevention measures could be ongoing through primary care.    LABS: Most recent lab results include: Lab Results  Component Value Date   WBC 8.1 06/14/2020   HGB 15.7 06/14/2020   HCT 45.9 06/14/2020   PLT 213.0 06/14/2020   GLUCOSE 169 (H) 10/02/2020   ALT 19 10/02/2020   AST 16 10/02/2020   NA 136 10/02/2020   K 4.1  10/02/2020   CL 101 10/02/2020   CREATININE 0.98 10/02/2020   BUN 19 10/02/2020   CO2 24 10/02/2020   HGBA1C 8.3 (A) 10/02/2020   MICROALBUR 1.4 06/14/2020   PFTs 11/15/20: FVC 4.58 (97%), post 4.59 (97%). FEV1 3.11 (90%), post 3.14 (91%). DLCO unc/cor 21.62 (79%).   IMAGES: CT Super D Chest 12/06/20: IMPRESSION: 1. Persistent right lower lobe mixed attenuation lung mass, felt to be slightly enlarged compared to the CT of 1 month ago. This remains suspicious  for primary bronchogenic carcinoma, likely adenocarcinoma. Given morphology, inflammatory etiologies such as organizing pneumonia are also considered. 2. No thoracic adenopathy. 3. Gastric wall thickening in the setting of underdistention. Correlate with symptoms of gastritis or other gastric pathology. Consider endoscopy. 4. Aortic atherosclerosis (ICD10-I70.0), coronary artery atherosclerosis and emphysema (ICD10-J43.9).   PET Scan 11/28/20: IMPRESSION: 1. Similar appearance of the right lower lobe mass with a high density central component and peripheral airspace opacity. The high density central component as a maximum SUV of 6.5, which is certainly suspicious for malignancy although a granulomatous infection could have a similar appearance. Tissue diagnosis recommended. 2. There is a new band of interstitial accentuation in the adjacent right upper lobe with maximum SUV of 2.7, likely inflammatory. 3. Other imaging findings of potential clinical significance: Aortic Atherosclerosis (ICD10-I70.0) and Emphysema (ICD10-J43.9). Coronary atherosclerosis. Wall thickening in the stomach is not hypermetabolic but could be inflammatory.   US Soft tissue neck 10/03/20:   IMPRESSION: Negative sonographic interrogation of the regions of clinical concern.    EKG: Last EKG was > 1 year ago.    CV: Cardiac cath 12/13/07:  CONCLUSION:  Coronary artery disease with recent non-ST-elevation  myocardial infarction with 99% stenosis in the distal LAD with TIMI 1  flow, 40% stenosis in the ramus branch of the circumflex artery, and 50%  stenosis in the posterior descending branch of the circumflex artery  (codominant) with irregularities in the right coronary artery, and a  small area of inferoapical wall akinesis with an estimated ejection  fraction of 60%.    The culprit vessel is the distal LAD, which is a small vessel at this  point.  I think medical therapy is indicated and continued  aggressive  secondary risk factor modification.   Past Medical History:  Diagnosis Date   CAD (coronary artery disease)    Cancer ([place])    SKIN.[name]   Cataract    Diabetes mellitus    2   DVT (deep venous thrombosis) ([facility]) 03/30/2001   RLE DVT, post ankle fracture   GERD (gastroesophageal reflux disease)    Hyperlipidemia    Hypertension    Myocardial infarct, 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   Peripheral neuropathy    hands    Past Surgical History:  Procedure Laterality Date   COLONOSCOPY W/ POLYPECTOMY  2011   LARYNGOSCOPY  1999    MEDICATIONS: No current facility-administered medications for this encounter.    aspirin EC 81 MG tablet   empagliflozin (JARDIANCE) 25 MG TABS tablet   metFORMIN (GLUCOPHAGE) 1000 MG tablet   metoprolol succinate (TOPROL-XL) 50 MG 24 hr tablet   Multiple Vitamin (MULTIVITAMIN WITH MINERALS) TABS tablet   omeprazole (PRILOSEC) 20 MG capsule   simvastatin (ZOCOR) 40 MG tablet   traZODone (DESYREL) 50 MG tablet   triamcinolone cream (KENALOG) 0.1 %   glucose blood (FREESTYLE LITE) test strip    Myra Gianotti, PA-C Surgical Short Stay/Anesthesiology [facility] Phone [phone] [facility] Phone [phone] 12/07/2020 1:11 PM

## 2020-12-07 NOTE — Anesthesia Preprocedure Evaluation (Addendum)
Anesthesia Evaluation  Patient identified by MRN, date of birth, ID band Patient awake    Reviewed: Allergy & Precautions, NPO status , Patient's Chart, lab work & pertinent test results  Airway Mallampati: II  TM Distance: >3 FB Neck ROM: Full    Dental  (+) Dental Advisory Given   Pulmonary Current Smoker,    breath sounds clear to auscultation       Cardiovascular hypertension, Pt. on medications and Pt. on home beta blockers + CAD, + Past MI and + Cardiac Stents   Rhythm:Regular Rate:Normal     Neuro/Psych negative neurological ROS     GI/Hepatic Neg liver ROS, GERD  ,  Endo/Other  diabetes, Type 2, Oral Hypoglycemic Agents  Renal/GU negative Renal ROS     Musculoskeletal   Abdominal   Peds  Hematology negative hematology ROS (+)   Anesthesia Other Findings   Reproductive/Obstetrics                            Anesthesia Physical Anesthesia Plan  ASA: 3  Anesthesia Plan: General   Post-op Pain Management:    Induction: Intravenous  PONV Risk Score and Plan: 1 and Dexamethasone, Ondansetron and Treatment may vary due to age or medical condition  Airway Management Planned: Oral ETT  Additional Equipment: None  Intra-op Plan:   Post-operative Plan: Extubation in OR  Informed Consent: I have reviewed the patients History and Physical, chart, labs and discussed the procedure including the risks, benefits and alternatives for the proposed anesthesia with the patient or authorized representative who has indicated his/her understanding and acceptance.     Dental advisory given  Plan Discussed with:   Anesthesia Plan Comments: ( )       Anesthesia Quick Evaluation

## 2020-12-10 ENCOUNTER — Encounter (HOSPITAL_COMMUNITY)
Admission: RE | Disposition: A | Discharge status: Home / Self Care | Payer: Self-pay | Source: Home / Self Care | Attending: Emergency Medicine

## 2020-12-10 ENCOUNTER — Ambulatory Visit (HOSPITAL_COMMUNITY): Payer: Medicare Other

## 2020-12-10 ENCOUNTER — Ambulatory Visit (HOSPITAL_COMMUNITY)
Admission: RE | Admit: 2020-12-10 | Discharge: 2020-12-10 | Disposition: A | Discharge status: Home / Self Care | Payer: Medicare Other | Attending: Emergency Medicine | Admitting: Emergency Medicine

## 2020-12-10 ENCOUNTER — Other Ambulatory Visit: Payer: Self-pay

## 2020-12-10 ENCOUNTER — Ambulatory Visit (HOSPITAL_COMMUNITY): Payer: Medicare Other | Admitting: Vascular Surgery

## 2020-12-10 ENCOUNTER — Encounter (HOSPITAL_COMMUNITY): Payer: Self-pay | Admitting: Emergency Medicine

## 2020-12-10 DIAGNOSIS — I2510 Atherosclerotic heart disease of native coronary artery without angina pectoris: Secondary | ICD-10-CM | POA: Insufficient documentation

## 2020-12-10 DIAGNOSIS — R918 Other nonspecific abnormal finding of lung field: Secondary | ICD-10-CM | POA: Diagnosis not present

## 2020-12-10 DIAGNOSIS — Z9889 Other specified postprocedural states: Secondary | ICD-10-CM

## 2020-12-10 DIAGNOSIS — I700 Atherosclerosis of aorta: Secondary | ICD-10-CM | POA: Diagnosis not present

## 2020-12-10 DIAGNOSIS — G4700 Insomnia, unspecified: Secondary | ICD-10-CM | POA: Diagnosis not present

## 2020-12-10 DIAGNOSIS — F17210 Nicotine dependence, cigarettes, uncomplicated: Secondary | ICD-10-CM | POA: Insufficient documentation

## 2020-12-10 DIAGNOSIS — I10 Essential (primary) hypertension: Secondary | ICD-10-CM | POA: Diagnosis not present

## 2020-12-10 DIAGNOSIS — Z85828 Personal history of other malignant neoplasm of skin: Secondary | ICD-10-CM | POA: Diagnosis not present

## 2020-12-10 DIAGNOSIS — Z880 Allergy status to penicillin: Secondary | ICD-10-CM | POA: Diagnosis not present

## 2020-12-10 DIAGNOSIS — J439 Emphysema, unspecified: Secondary | ICD-10-CM | POA: Insufficient documentation

## 2020-12-10 DIAGNOSIS — Z7984 Long term (current) use of oral hypoglycemic drugs: Secondary | ICD-10-CM | POA: Diagnosis not present

## 2020-12-10 DIAGNOSIS — F5101 Primary insomnia: Secondary | ICD-10-CM

## 2020-12-10 DIAGNOSIS — Z79899 Other long term (current) drug therapy: Secondary | ICD-10-CM | POA: Diagnosis not present

## 2020-12-10 DIAGNOSIS — Z7982 Long term (current) use of aspirin: Secondary | ICD-10-CM | POA: Insufficient documentation

## 2020-12-10 DIAGNOSIS — C3431 Malignant neoplasm of lower lobe, right bronchus or lung: Secondary | ICD-10-CM | POA: Insufficient documentation

## 2020-12-10 DIAGNOSIS — C3490 Malignant neoplasm of unspecified part of unspecified bronchus or lung: Secondary | ICD-10-CM

## 2020-12-10 DIAGNOSIS — Z419 Encounter for procedure for purposes other than remedying health state, unspecified: Secondary | ICD-10-CM

## 2020-12-10 DIAGNOSIS — E1142 Type 2 diabetes mellitus with diabetic polyneuropathy: Secondary | ICD-10-CM | POA: Diagnosis not present

## 2020-12-10 HISTORY — DX: Malignant neoplasm of unspecified part of unspecified bronchus or lung: C34.90

## 2020-12-10 HISTORY — PX: BRONCHIAL BRUSHINGS: SHX5108

## 2020-12-10 HISTORY — PX: BRONCHIAL BIOPSY: SHX5109

## 2020-12-10 HISTORY — PX: BRONCHIAL NEEDLE ASPIRATION BIOPSY: SHX5106

## 2020-12-10 HISTORY — PX: VIDEO BRONCHOSCOPY WITH ENDOBRONCHIAL NAVIGATION: SHX6175

## 2020-12-10 LAB — BASIC METABOLIC PANEL
Anion gap: 9 (ref 5–15)
BUN: 13 mg/dL (ref 8–23)
CO2: 25 mmol/L (ref 22–32)
Calcium: 8.8 mg/dL — ABNORMAL LOW (ref 8.9–10.3)
Chloride: 104 mmol/L (ref 98–111)
Creatinine, Ser: 0.94 mg/dL (ref 0.61–1.24)
GFR, Estimated: >60 mL/min (ref >60)
Glucose, Bld: 193 mg/dL — ABNORMAL HIGH (ref 70–99)
Potassium: 3.9 mmol/L (ref 3.5–5.1)
Sodium: 138 mmol/L (ref 135–145)

## 2020-12-10 LAB — GLUCOSE, CAPILLARY
Glucose-Capillary: 209 mg/dL — ABNORMAL HIGH (ref 70–99)
Glucose-Capillary: 205 mg/dL — ABNORMAL HIGH (ref 70–99)

## 2020-12-10 IMAGING — DX DG CHEST 1V PORT
1 series · 1 of 1 positions shown · non-contrast
Comparison: Chest x-ray [DATE].

CLINICAL DATA: 70-year-old male status post bronchoscopy.

EXAM:
PORTABLE CHEST 1 VIEW

[chest ap]
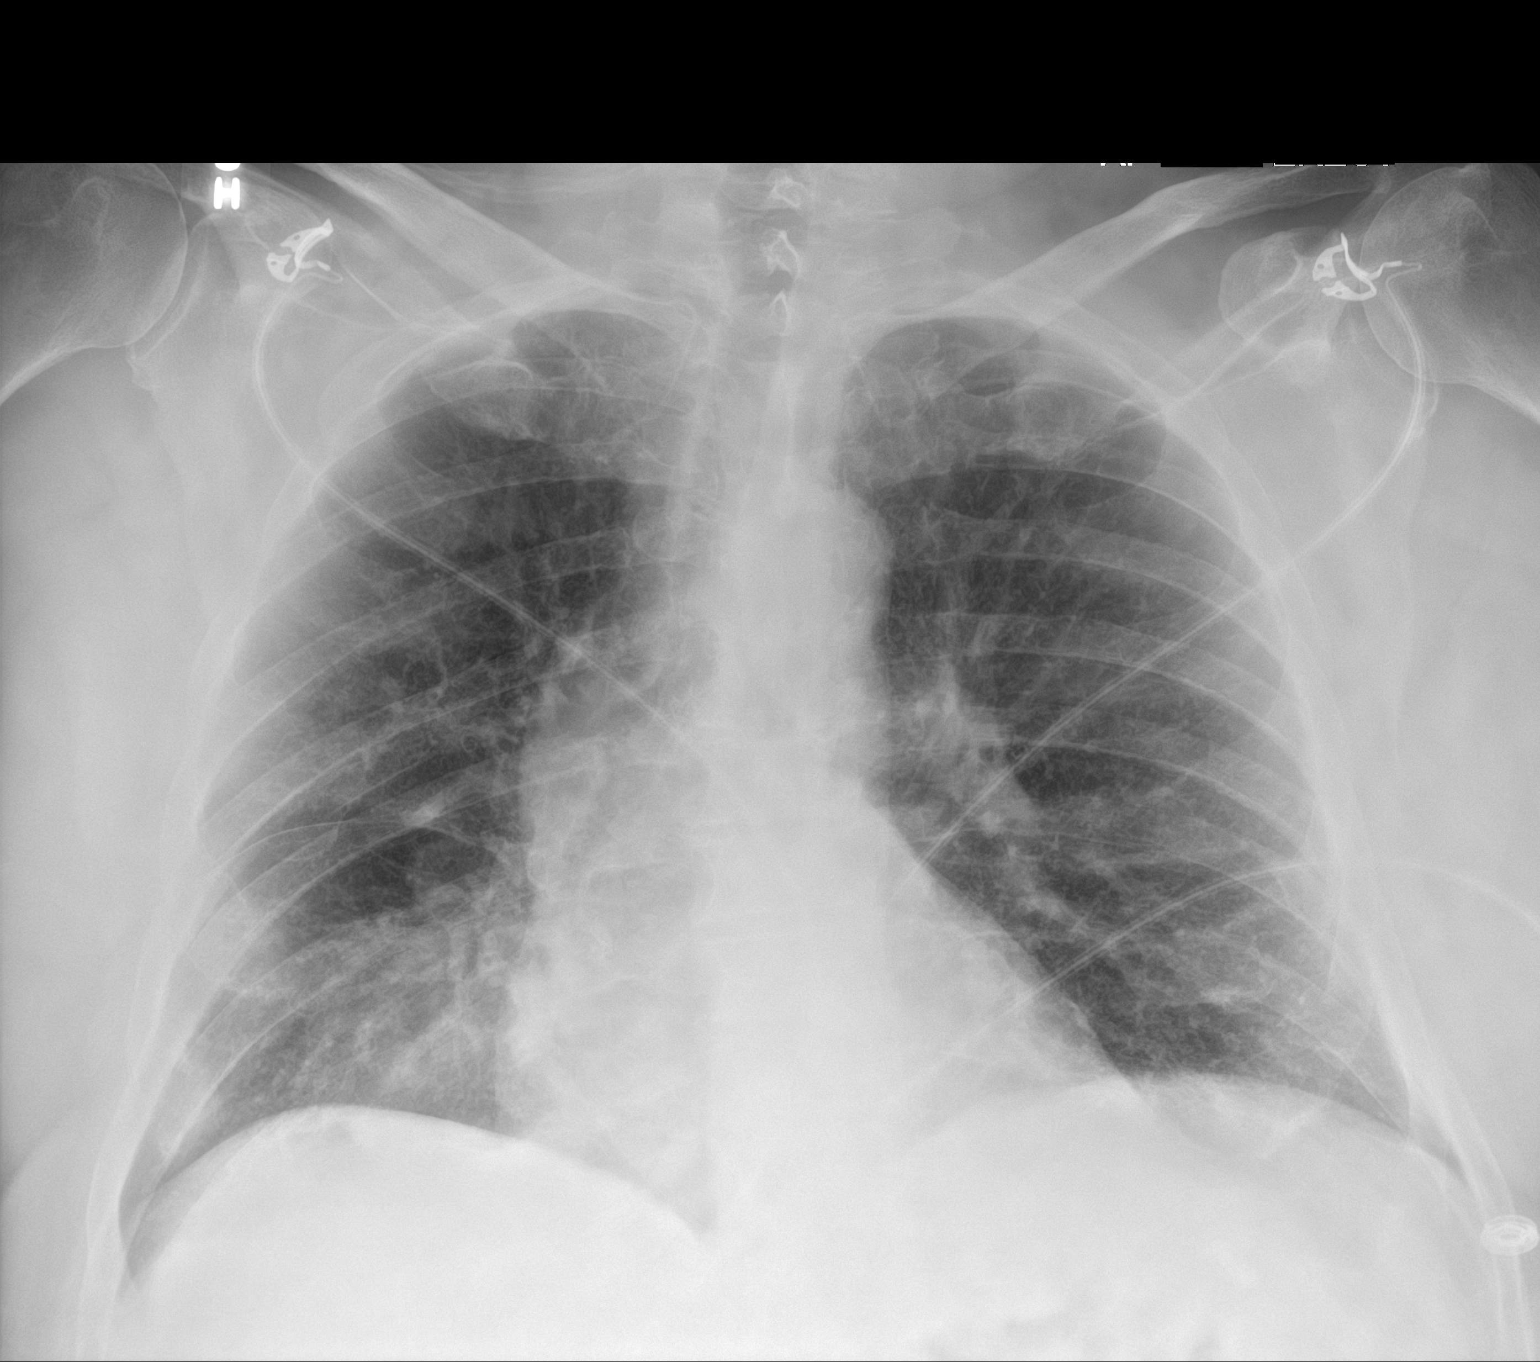

[1 of 1 positions shown; findings below may reference images not displayed]

FINDINGS: Lung volumes are low. No consolidative airspace disease. No pleural
effusions. No pneumothorax. Large right lower lobe mass (better
demonstrated on prior chest CT [DATE]). Pulmonary vasculature
and the cardiomediastinal silhouette are within normal limits.
IMPRESSION: 1. No radiographic evidence of acute cardiopulmonary disease.
2. Large right lower lobe mass better demonstrated on prior chest
CT.

## 2020-12-10 SURGERY — VIDEO BRONCHOSCOPY WITH ENDOBRONCHIAL NAVIGATION
Anesthesia: General

## 2020-12-10 MED ORDER — CHLORHEXIDINE GLUCONATE 0.12 % MT SOLN
OROMUCOSAL | Status: AC
  Administered 2020-12-10: 15 mL via OROMUCOSAL
  Filled 2020-12-10: qty 15

## 2020-12-10 MED ORDER — DEXAMETHASONE SODIUM PHOSPHATE 10 MG/ML IJ SOLN
INTRAMUSCULAR | Status: DC | PRN
  Administered 2020-12-10: 5 mg via INTRAVENOUS

## 2020-12-10 MED ORDER — MIDAZOLAM HCL 2 MG/2ML IJ SOLN
INTRAMUSCULAR | Status: DC | PRN
  Administered 2020-12-10: 2 mg via INTRAVENOUS

## 2020-12-10 MED ORDER — TRAZODONE HCL 50 MG PO TABS
50.0000 mg | ORAL_TABLET | Freq: Every evening | ORAL | Status: DC | PRN
Start: 2020-12-10 — End: 2020-12-28

## 2020-12-10 MED ORDER — SUGAMMADEX SODIUM 200 MG/2ML IV SOLN
INTRAVENOUS | Status: DC | PRN
  Administered 2020-12-10: 200 mg via INTRAVENOUS

## 2020-12-10 MED ORDER — FENTANYL CITRATE (PF) 100 MCG/2ML IJ SOLN
INTRAMUSCULAR | Status: DC | PRN
  Administered 2020-12-10: 100 ug via INTRAVENOUS

## 2020-12-10 MED ORDER — PROPOFOL 10 MG/ML IV BOLUS
INTRAVENOUS | Status: DC | PRN
  Administered 2020-12-10: 150 mg via INTRAVENOUS

## 2020-12-10 MED ORDER — LACTATED RINGERS IV SOLN: INTRAVENOUS | Status: DC

## 2020-12-10 MED ORDER — ROCURONIUM BROMIDE 10 MG/ML (PF) SYRINGE
PREFILLED_SYRINGE | INTRAVENOUS | Status: DC | PRN
  Administered 2020-12-10: 20 mg via INTRAVENOUS
  Administered 2020-12-10: 50 mg via INTRAVENOUS

## 2020-12-10 MED ORDER — TRIAMCINOLONE ACETONIDE 0.1 % EX CREA: 1.0000 "application " | TOPICAL_CREAM | Freq: Two times a day (BID) | CUTANEOUS | Status: DC | PRN

## 2020-12-10 MED ORDER — AMISULPRIDE (ANTIEMETIC) 5 MG/2ML IV SOLN: 10.0000 mg | Freq: Once | INTRAVENOUS | Status: DC | PRN

## 2020-12-10 MED ORDER — LIDOCAINE 2% (20 MG/ML) 5 ML SYRINGE
INTRAMUSCULAR | Status: DC | PRN
  Administered 2020-12-10: 60 mg via INTRAVENOUS

## 2020-12-10 MED ORDER — ONDANSETRON HCL 4 MG/2ML IJ SOLN
INTRAMUSCULAR | Status: DC | PRN
  Administered 2020-12-10: 4 mg via INTRAVENOUS

## 2020-12-10 MED ORDER — CHLORHEXIDINE GLUCONATE 0.12 % MT SOLN: 15.0000 mL | Freq: Once | OROMUCOSAL | Status: AC

## 2020-12-10 MED ORDER — FENTANYL CITRATE (PF) 100 MCG/2ML IJ SOLN: 25.0000 ug | INTRAMUSCULAR | Status: DC | PRN

## 2020-12-10 NOTE — Anesthesia Postprocedure Evaluation (Signed)
Anesthesia Post Note  Patient: Brandon Rubio.  Procedure(s) Performed: ROBOTIC VIDEO BRONCHOSCOPY WITH ENDOBRONCHIAL NAVIGATION BRONCHIAL BIOPSIES BRONCHIAL BRUSHINGS BRONCHIAL NEEDLE ASPIRATION BIOPSIES     Patient location during evaluation: PACU Anesthesia Type: General Level of consciousness: awake and alert Pain management: pain level controlled Vital Signs Assessment: post-procedure vital signs reviewed and stable Respiratory status: spontaneous breathing, nonlabored ventilation, respiratory function stable and patient connected to nasal cannula oxygen Cardiovascular status: blood pressure returned to baseline and stable Postop Assessment: no apparent nausea or vomiting Anesthetic complications: no   No notable events documented.  Last Vitals:  Vitals:   12/10/20 0909 12/10/20 0924  BP: 133/66 (!) 144/67  Pulse: 78 80  Resp:  20  Temp:  36.7 C  SpO2: 92% 95%    Last Pain:  Vitals:   12/10/20 0924  TempSrc:   PainSc: 0-No pain                 Tiajuana Amass

## 2020-12-10 NOTE — Op Note (Signed)
Video Bronchoscopy with Robotic Assisted Bronchoscopic Navigation   Date of Operation: 12/10/2020   Pre-op Diagnosis: Right lower lobe mass  Post-op Diagnosis: Same  Surgeon: Baltazar Apo  Assistants: None  Anesthesia: General endotracheal anesthesia  Operation: Flexible video fiberoptic bronchoscopy with robotic assistance and biopsies.  Estimated Blood Loss: Minimal  Complications: None  Indications and History: Brandon Rubio. is a 70 y.o. male with history of tobacco use.  Found to have a mixed density right lower lobe mass on lung cancer screening CT scan.  Recommendation made to achieve a tissue diagnosis via navigational bronchoscopy. The risks, benefits, complications, treatment options and expected outcomes were discussed with the patient.  The possibilities of pneumothorax, pneumonia, reaction to medication, pulmonary aspiration, perforation of a viscus, bleeding, failure to diagnose a condition and creating a complication requiring transfusion or operation were discussed with the patient who freely signed the consent.    Description of Procedure: The patient was seen in the Preoperative Area, was examined and was deemed appropriate to proceed.  The patient was taken to [facility] endoscopy room 3, identified as Brandon Rubio. and the procedure verified as Flexible Video Fiberoptic Bronchoscopy.  A Time Out was held and the above information confirmed.   Prior to the date of the procedure a high-resolution CT scan of the chest was performed. Utilizing ION software program a virtual tracheobronchial tree was generated to allow the creation of distinct navigation pathways to the patient's parenchymal abnormalities. After being taken to the operating room general anesthesia was initiated and the patient  was orally intubated. The video fiberoptic bronchoscope was introduced via the endotracheal tube and a general inspection was performed which showed normal right and left lung  anatomy, aspiration of the bilateral mainstems was completed to remove any remaining secretions. Robotic catheter inserted into patient's endotracheal tube.   Target #1 right lower lobe mass: The distinct navigation pathways prepared prior to this procedure were then utilized to navigate to patient's lesion identified on CT scan. The robotic catheter was secured into place and the vision probe was withdrawn.  Lesion location was approximated using fluoroscopy and radial endobronchial ultrasound for peripheral targeting. Under fluoroscopic guidance transbronchial needle brushings, transbronchial needle biopsies, and transbronchial forceps biopsies were performed to be sent for cytology and pathology.   At the end of the procedure a general airway inspection was performed and there was no evidence of active bleeding. The bronchoscope was removed.  The patient tolerated the procedure well. There was no significant blood loss and there were no obvious complications. A post-procedural chest x-ray is pending.  Samples Target #1: 1. Transbronchial needle brushings from right lower lobe mass 2. Transbronchial Wang needle biopsies from right lower lobe mass 3. Transbronchial forceps biopsies from right lower lobe mass  Plans:  The patient will be discharged from the PACU to home when recovered from anesthesia and after chest x-ray is reviewed. We will review the cytology, pathology and microbiology results with the patient when they become available. Outpatient followup will be with Dr. Lamonte Sakai.    Baltazar Apo, MD, PhD 12/10/2020, 8:48 AM [facility] [phone] or if no answer before 7:00PM call [phone] For any issues after 7:00PM please call eLink [phone]

## 2020-12-10 NOTE — Anesthesia Procedure Notes (Signed)
Procedure Name: Intubation Date/Time: 12/10/2020 7:43 AM Performed by: Moshe Salisbury, CRNA Pre-anesthesia Checklist: Patient identified, Emergency Drugs available, Suction available and Patient being monitored Patient Re-evaluated:Patient Re-evaluated prior to induction Oxygen Delivery Method: Circle System Utilized Preoxygenation: Pre-oxygenation with 100% oxygen Induction Type: IV induction Ventilation: Mask ventilation without difficulty Laryngoscope Size: Mac and 4 Grade View: Grade II Tube type: Oral Tube size: 8.5 mm Number of attempts: 1 Airway Equipment and Method: Stylet Placement Confirmation: ETT inserted through vocal cords under direct vision, positive ETCO2 and breath sounds checked- equal and bilateral Secured at: 21 cm Tube secured with: Tape Dental Injury: Teeth and Oropharynx as per pre-operative assessment

## 2020-12-10 NOTE — Discharge Instructions (Signed)
Flexible Bronchoscopy, Care After This sheet gives you information about how to care for yourself after your test. Your doctor may also give you more specific instructions. If you have problems or questions, contact your doctor. Follow these instructions at home: Eating and drinking Do not eat or drink anything (not even water) for 2 hours after your test, or until your numbing medicine (local anesthetic) wears off. When your numbness is gone and your cough and gag reflexes have come back, you may: Eat only soft foods. Slowly drink liquids. The day after the test, go back to your normal diet. Driving Do not drive for 24 hours if you were given a medicine to help you relax (sedative). Do not drive or use heavy machinery while taking prescription pain medicine. General instructions  Take over-the-counter and prescription medicines only as told by your doctor. Return to your normal activities as told. Ask what activities are safe for you. Do not use any products that have nicotine or tobacco in them. This includes cigarettes and e-cigarettes. If you need help quitting, ask your doctor. Keep all follow-up visits as told by your doctor. This is important. It is very important if you had a tissue sample (biopsy) taken. Get help right away if: You have shortness of breath that gets worse. You get light-headed. You feel like you are going to pass out (faint). You have chest pain. You cough up: More than a little blood. More blood than before. Summary Do not eat or drink anything (not even water) for 2 hours after your test, or until your numbing medicine wears off. Do not use cigarettes. Do not use e-cigarettes. Get help right away if you have chest pain.  Please call our office for any questions or concerns.  [phone].  This information is not intended to replace advice given to you by your health care provider. Make sure you discuss any questions you have with your health care  provider. Document Released: 12/22/2008 Document Revised: 02/06/2017 Document Reviewed: 03/14/2016 Elsevier Patient Education  2020 [employer].

## 2020-12-10 NOTE — Transfer of Care (Signed)
Immediate Anesthesia Transfer of Care Note  Patient: Brandon Rubio.  Procedure(s) Performed: ROBOTIC VIDEO BRONCHOSCOPY WITH ENDOBRONCHIAL NAVIGATION BRONCHIAL BIOPSIES BRONCHIAL BRUSHINGS BRONCHIAL NEEDLE ASPIRATION BIOPSIES  Patient Location: PACU  Anesthesia Type:General  Level of Consciousness: drowsy and patient cooperative  Airway & Oxygen Therapy: Patient Spontanous Breathing and Patient connected to nasal cannula oxygen  Post-op Assessment: Report given to RN, Post -op Vital signs reviewed and stable and Patient moving all extremities  Post vital signs: Reviewed and stable  Last Vitals:  Vitals Value Taken Time  BP 145/82 12/10/20 0854  Temp    Pulse 74 12/10/20 0854  Resp 15 12/10/20 0854  SpO2 97 % 12/10/20 0854  Vitals shown include unvalidated device data.  Last Pain:  Vitals:   12/10/20 0636  TempSrc:   PainSc: 0-No pain         Complications: No notable events documented.

## 2020-12-10 NOTE — Interval H&P Note (Signed)
History and Physical Interval Note:  12/10/2020 7:20 AM  Brandon Rubio.  has presented today for surgery, with the diagnosis of right lower lope mass.  The various methods of treatment have been discussed with the patient and family. After consideration of risks, benefits and other options for treatment, the patient has consented to  Procedure(s): ROBOTIC [place] (N/A) as a surgical intervention.  The patient's history has been reviewed, patient examined, no change in status, stable for surgery.  I have reviewed the patient's chart and labs.  Questions were answered to the patient's satisfaction.     Collene Gobble

## 2020-12-11 ENCOUNTER — Ambulatory Visit (INDEPENDENT_AMBULATORY_CARE_PROVIDER_SITE_OTHER): Payer: Medicare Other

## 2020-12-11 ENCOUNTER — Telehealth: Payer: Self-pay | Admitting: Emergency Medicine

## 2020-12-11 ENCOUNTER — Encounter: Payer: Self-pay | Admitting: Primary Care

## 2020-12-11 ENCOUNTER — Ambulatory Visit (INDEPENDENT_AMBULATORY_CARE_PROVIDER_SITE_OTHER): Payer: Medicare Other | Admitting: Primary Care

## 2020-12-11 VITALS — BP 122/70 | HR 79 | Temp 97.9°F | Ht 72.0 in | Wt 220.8 lb

## 2020-12-11 DIAGNOSIS — R918 Other nonspecific abnormal finding of lung field: Secondary | ICD-10-CM

## 2020-12-11 DIAGNOSIS — R21 Rash and other nonspecific skin eruption: Secondary | ICD-10-CM | POA: Insufficient documentation

## 2020-12-11 DIAGNOSIS — R042 Hemoptysis: Secondary | ICD-10-CM | POA: Diagnosis not present

## 2020-12-11 LAB — SARS CORONAVIRUS 2 (TAT 6-24 HRS): SARS Coronavirus 2: NEGATIVE

## 2020-12-11 IMAGING — DX DG CHEST 2V
2 series · 2 of 2 positions shown · non-contrast
Comparison: [DATE], CT chest [DATE]

CLINICAL DATA: Hemoptysis

EXAM:
CHEST - 2 VIEW

[chest pa]
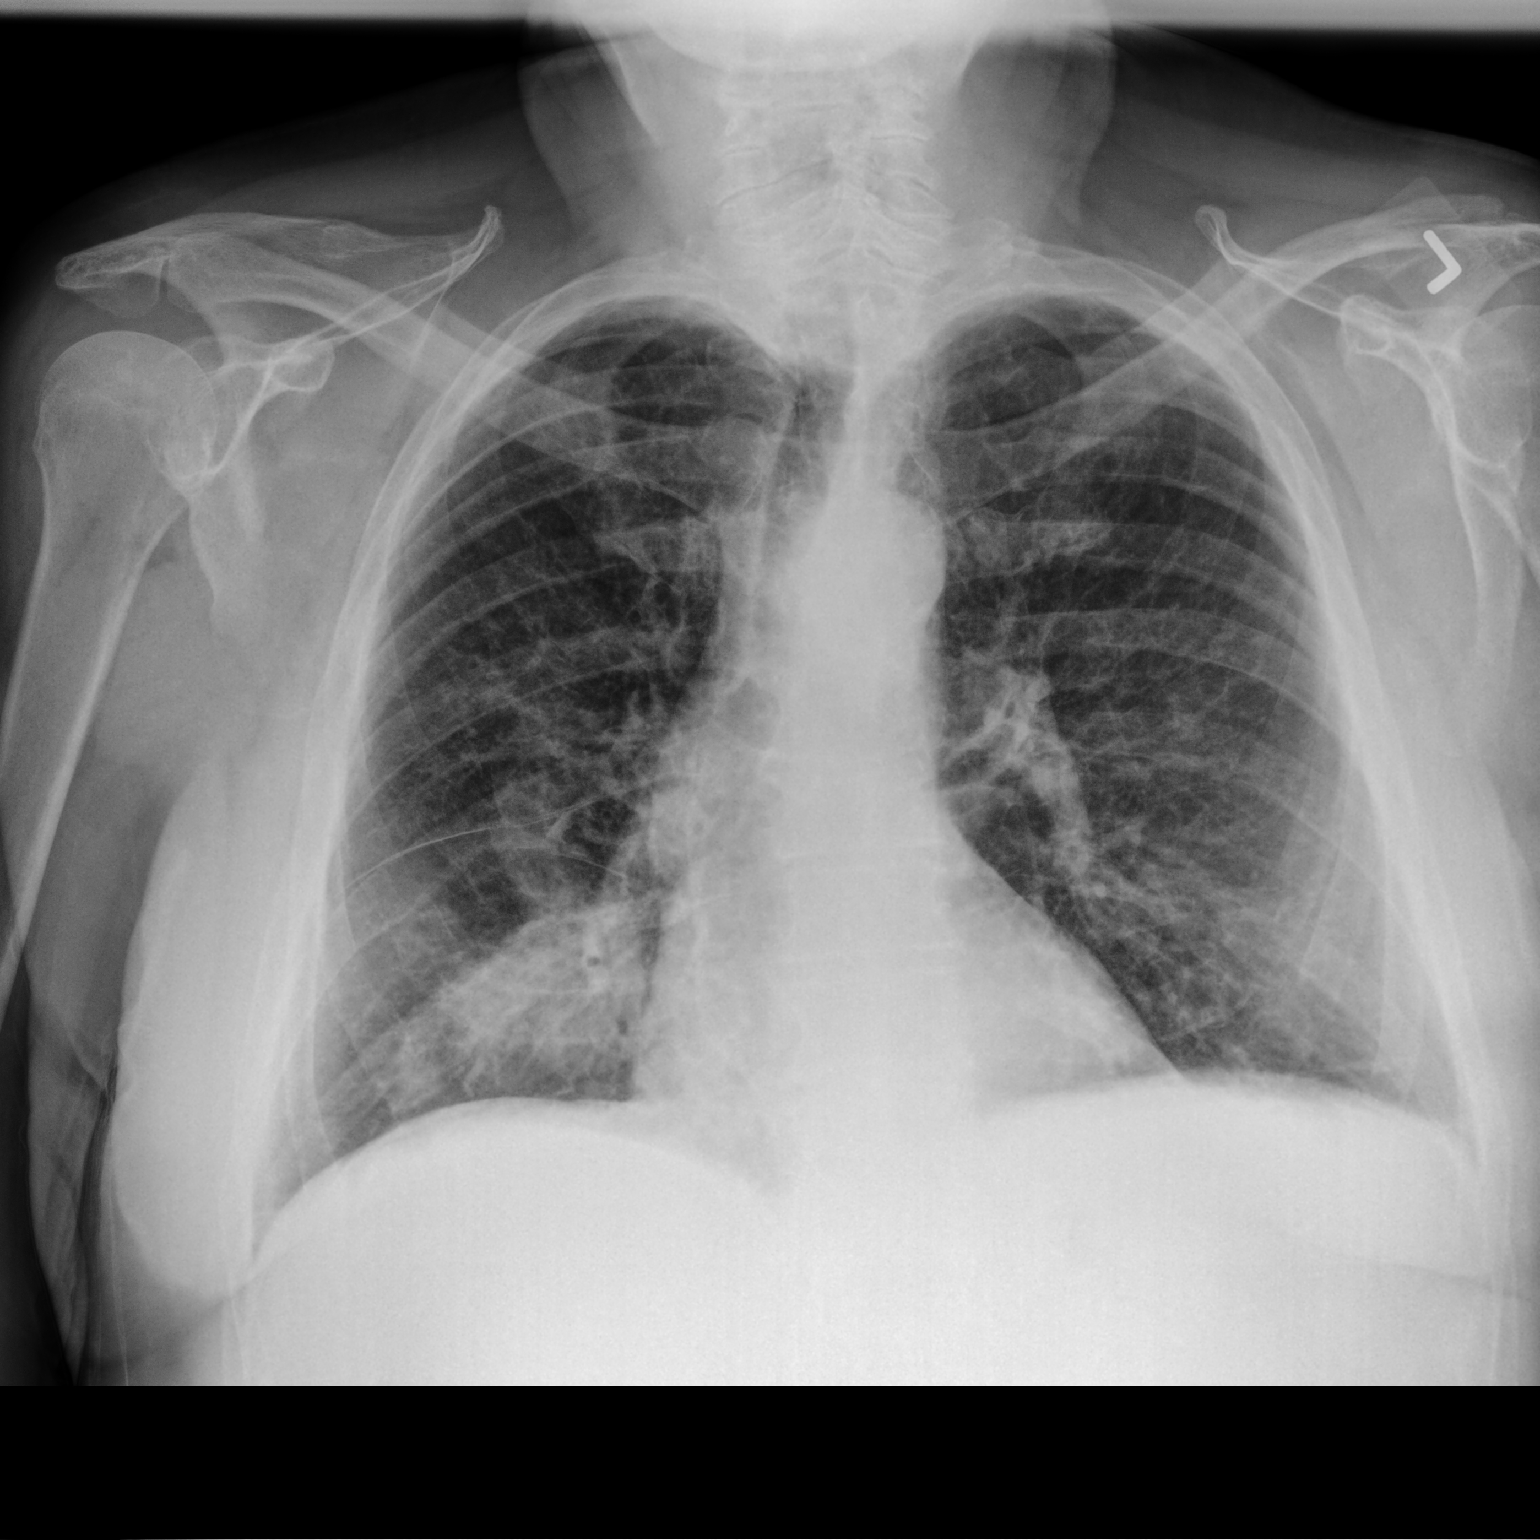

[chest lat]
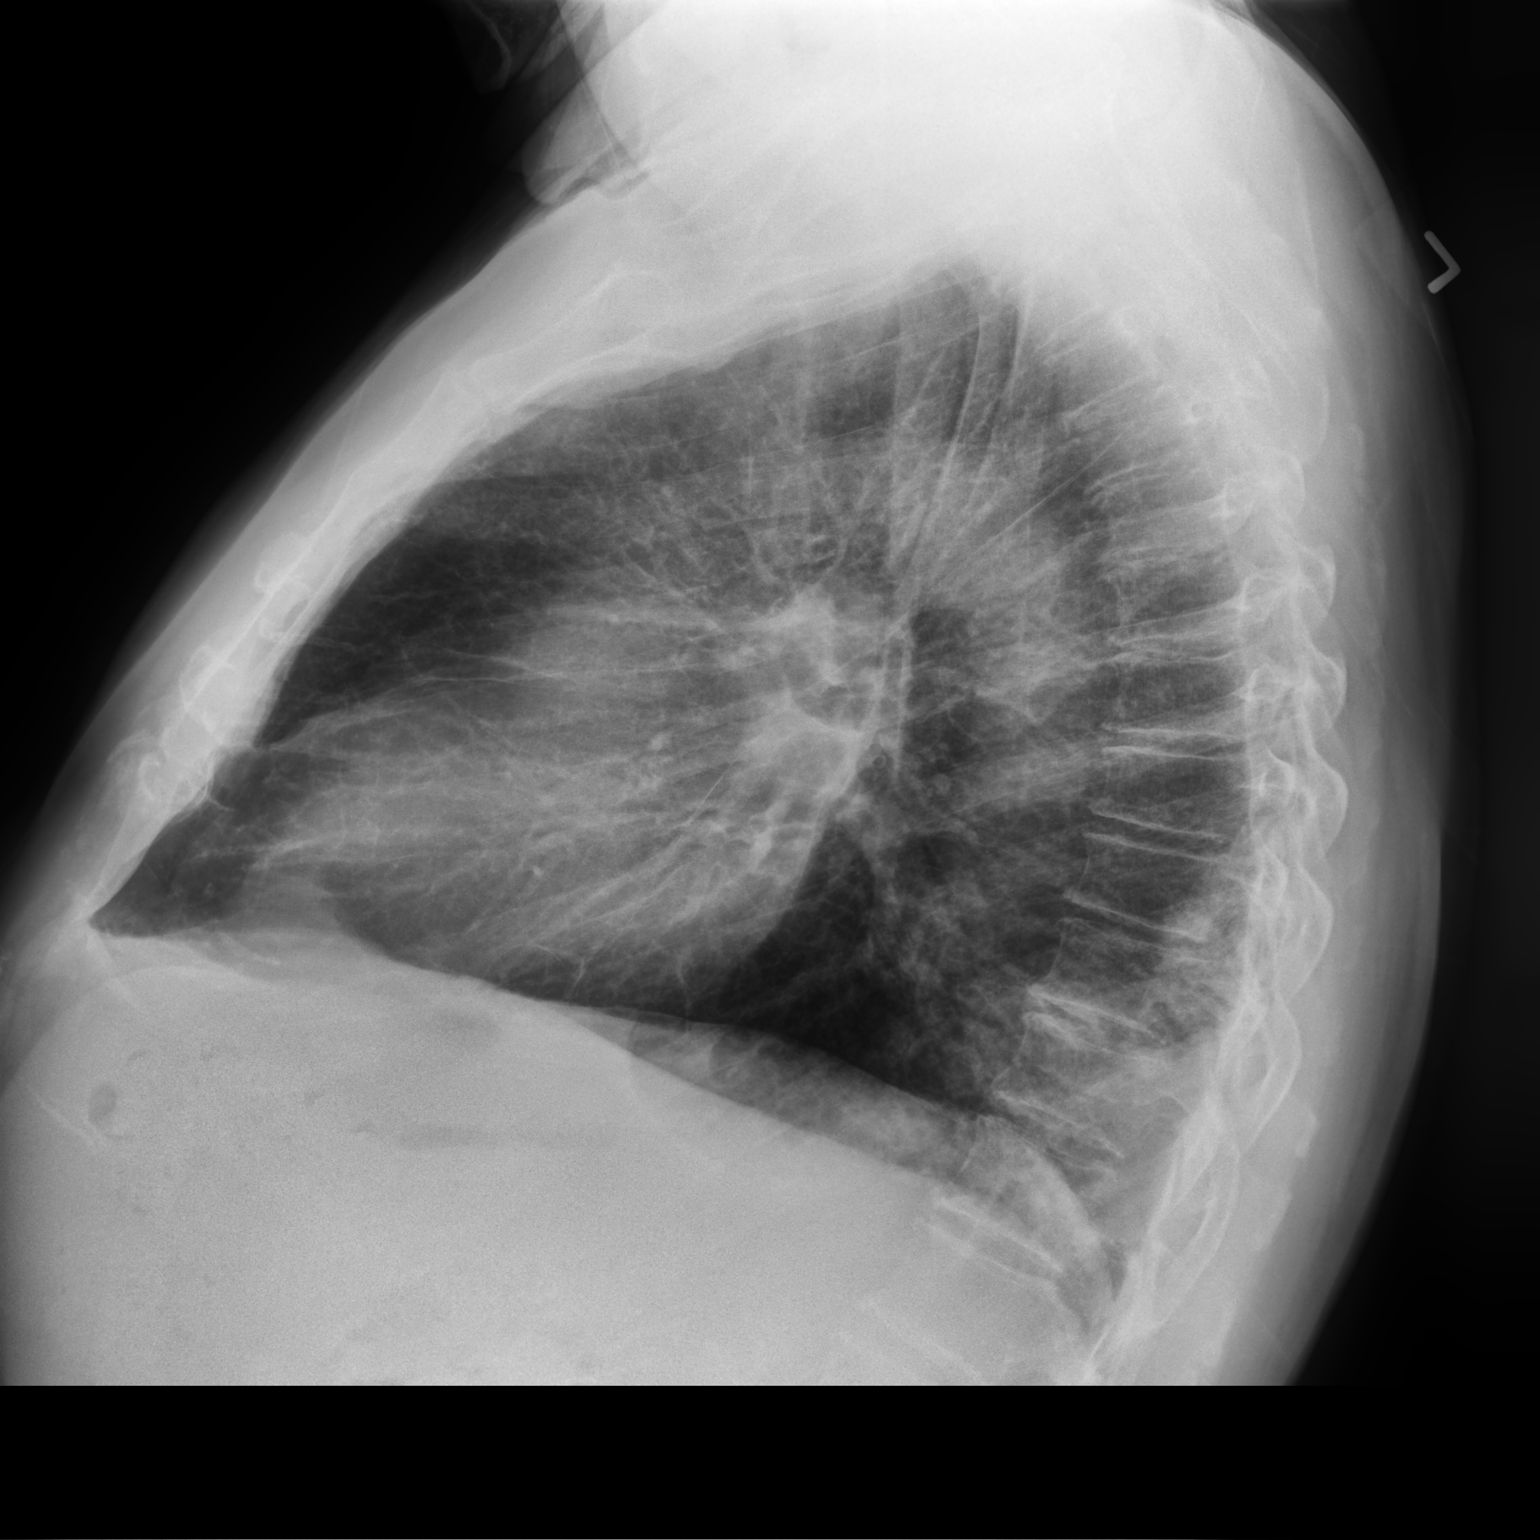

[2 of 2 positions shown; findings below may reference images not displayed]

FINDINGS: Right lower lobe lung mass. This appears slightly more dense
compared to the radiograph on [DATE]. No pleural effusion. No
pneumothorax. Normal cardiac size
IMPRESSION: Redemonstrated right lower lobe lung mass. On AP view appears
slightly more dense as compared with [DATE], potentially due to
acute superimposed airspace disease (?hemorrhage)

## 2020-12-11 MED ORDER — AZITHROMYCIN 250 MG PO TABS: ORAL_TABLET | ORAL | 0 refills | Status: DC

## 2020-12-11 NOTE — Assessment & Plan Note (Addendum)
-   Faint diffuse erythematous rash right hand/forearm. He had IV in his left arm for procedure yesterday.  - No concerning findings on exam; No fever, VSS. Checking CBC

## 2020-12-11 NOTE — Patient Instructions (Addendum)
Dr. Lamonte Sakai reviewed your CXR, there is a little hemorrhage in your lung on the side he biopsied Recommend cough suppressant with delsym cough OTC  Sending in [place] labs

## 2020-12-11 NOTE — Telephone Encounter (Signed)
Called and spoke to pt. Informed pt of the recs per RB. Appt scheduled with Derl Barrow, NP, today at 4pm. Pt states the redness in his wrist/hand is the same arm that his IV was in from the [facility].    Will forward to Dr. Lamonte Sakai and Derl Barrow, NP, as Brandon Rubio.

## 2020-12-11 NOTE — Addendum Note (Signed)
Addended by: Martyn Ehrich on: 12/11/2020 11:43 AM   Modules accepted: Orders

## 2020-12-11 NOTE — Telephone Encounter (Signed)
Spoke with the pt and notified needs to arrive at 3:30 pm.

## 2020-12-11 NOTE — Progress Notes (Signed)
'@Patient'  ID: Brandon Rubio., male    DOB: 03-Jun-1950, 70 y.o.   MRN: [id number]  Chief Complaint  Patient presents with   Follow-up    Post bronch f/u. Had a bronch yesterday and noticed that his right hand started turning red this morning. The IV was in his left arm. Denied any warmth, soreness to site.     Referring provider: [name], *  HPI: 70 year old male, current every day smoker. PMH significant for HTN, hyperlipidemia, chronic cough, GERD. Patient of Dr. Lamonte Sakai, seen on 12/05/20 for right low lobe lung mass.   Previous [facility] pulmonary encounter: 12/05/20- Dr. Lamonte Sakai  70 year old male with history of tobacco use (116 pack years), CAD, diabetes, hypertension, had a spontaneous PTX in his 20's, required a chest tube.  He is referred today for evaluation of an abnormal CT scan of the chest. He began to dissipate in lung cancer screening program 2022, screening CT done 11/02/2020.  Abnormality prompted PET scan as below. He describes throat irritation, pain that has been a problem for several years. He has seen ENT with no clear cause identified. It has been progressively worse. He is using hydrocodone for it. No dysphagia. No cough. Will have some SOB with walking 1/8 mile, stairs. Able to do his ADL. Able to shop.   Screening CT chest 11/02/2020 reviewed by me, shows moderate centrilobular emphysematous change, large semisolid rounded mass in the posterior right lower lobe 7.2 x 5.7 cm.  No other perceived abnormalities  PET scan 11/28/2020 reviewed by me confirms rounded peripheral region of consolidation with some internal air bronchograms in the right lower lobe, hypermetabolic with SUV 6.5.  There is some mild hypermetabolism associated with some increasing bandlike interstitial accentuation in the posterior right lower lobe that is new from the CT 11/02/2020.  There is no evidence of any adenopathy or distant disease.  Right lower lobe lung mass Asymptomatic right lower  lobe mass with hypermetabolism on PET scan.  Mixed density.  Very suspicious for primary malignancy, probably adenocarcinoma.  Discussed the options with him this morning and I have recommended bronchoscopy and tissue diagnosis is soon as we can arrange it.  He agrees.  He will need a dedicated super D CT in order to facilitate navigation.  He will have to stop his aspirin 2 days prior.    12/11/2020- Interim hx  Patient presents today for hemoptysis. He underwent bronchoscopy yesterday with Dr. Lamonte Sakai for hypermetabolic right lower lobe lung mass. Patient coughed up tsp bright red blood several times since procedure yesterday. Last episodes was 30 min ago. He has no increased shortness of breath or chest pain/discomfort. He is not taking anything for his cough. He is still smoking. He also has reports right hand discoloration described as skin turning red from his fingers down to his wrist. Afebrile.   Allergies  Allergen Reactions   Penicillins Swelling    REACTION: throat swelling Because of a history of documented adverse serious drug reaction;[name] Alert bracelet  is recommended    Immunization History  Administered Date(s) Administered   Influenza, High Dose Seasonal PF 11/18/2017, 11/23/2018   Influenza,inj,Quad PF,6+ Mos 05/11/2015   Influenza-Unspecified 01/02/2013, 01/03/2014, 12/23/2018   PFIZER(Purple Top)SARS-COV-2 Vaccination 06/14/2019, 07/10/2019, 02/21/2020   Pneumococcal Conjugate-13 09/02/2017   Tdap 11/18/2017   Zoster, Live 01/03/2014    Past Medical History:  Diagnosis Date   CAD (coronary artery disease)    Cancer ([place])    SKIN.[name]   Cataract  Diabetes mellitus    2   DVT (deep venous thrombosis) ([facility]) 03/30/2001   RLE DVT, post ankle fracture   GERD (gastroesophageal reflux disease)    Hyperlipidemia    Hypertension    Myocardial infarct, 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 Swallow whole.     empagliflozin (JARDIANCE) 25 MG TABS tablet Take 1 tablet (25 mg total) by mouth daily. 90 tablet 3   glucose blood (FREESTYLE LITE) test strip USE TO TEST BLOOD SUGAR TWICE DAILY ICD E11.49 100 each 3   metFORMIN (GLUCOPHAGE) 1000 MG tablet TAKE 1 TABLET BY MOUTH TWICE DAILY WITH A MEAL 180 tablet 1   metoprolol succinate (TOPROL-XL) 50 MG 24 hr tablet TAKE 1 TABLET BY MOUTH EVERY DAY. 90 tablet 0   Multiple Vitamin (MULTIVITAMIN WITH MINERALS) TABS tablet Take 1 tablet by mouth daily. Centrum Silver     omeprazole (PRILOSEC) 20 MG capsule TAKE 1 CAPSULE(20 MG) BY MOUTH TWICE DAILY BEFORE A MEAL 180 capsule 3   simvastatin (ZOCOR) 40 MG tablet Take 1 tablet (40 mg total) by mouth daily at 6 PM. 90 tablet 3   traZODone (DESYREL) 50 MG tablet Take 1 tablet (50 mg total) by mouth at bedtime as needed for sleep.     triamcinolone cream (KENALOG) 0.1 % Apply 1 application topically 2 (two) times daily as needed (poison ivy).     No facility-administered medications prior to visit.      Review of Systems  Review of Systems  Constitutional:  Negative for fever.  HENT: Negative.    Respiratory:  Positive for cough. Negative for chest tightness, shortness of breath and wheezing.   Cardiovascular:  Negative for chest pain.    Physical Exam  BP 122/70   Pulse 79   Temp 97.9 F (36.6 C) (Oral)   Ht 6' (1.829 m)   Wt 220 lb 12.8 oz (100.2 kg)   SpO2 96% Comment: on RA  BMI 29.95 kg/m  Physical Exam Constitutional:      General: He is not  in acute distress.    Appearance: Normal appearance. He is not ill-appearing.  HENT:     Head: Normocephalic and atraumatic.     Mouth/Throat:     Comments: Deferred d/t masking Cardiovascular:     Rate and Rhythm: Normal rate and regular rhythm.  Pulmonary:     Effort: Pulmonary effort is normal.     Breath sounds: Normal breath sounds. No wheezing or rales.  Musculoskeletal:        General: Normal range of motion.  Skin:    General: Skin is warm and dry.     Capillary Refill: Capillary refill takes less than 2 seconds.     Findings: Erythema present. No bruising or lesion.     Comments: Extremely faint diffuse erythema right hand-forearm   Neurological:     General: No focal deficit present.     Mental Status:  He is alert and oriented to person, place, and time. Mental status is at baseline.  Psychiatric:        Mood and Affect: Mood normal.        Behavior: Behavior normal.        Thought Content: Thought content normal.        Judgment: Judgment normal.     Lab Results:  CBC    Component Value Date/Time   WBC 8.1 06/14/2020 1016   RBC 5.18 06/14/2020 1016   HGB 15.7 06/14/2020 1016   HCT 45.9 06/14/2020 1016   PLT 213.0 06/14/2020 1016   MCV 88.5 06/14/2020 1016   MCHC 34.3 06/14/2020 1016   RDW 13.4 06/14/2020 1016   LYMPHSABS 2.2 06/22/2014 0810   MONOABS 0.5 06/22/2014 0810   EOSABS 0.1 06/22/2014 0810   BASOSABS 0.0 06/22/2014 0810    BMET    Component Value Date/Time   NA 138 12/10/2020 0630   K 3.9 12/10/2020 0630   CL 104 12/10/2020 0630   CO2 25 12/10/2020 0630   GLUCOSE 193 (H) 12/10/2020 0630   BUN 13 12/10/2020 0630   CREATININE 0.94 12/10/2020 0630   CALCIUM 8.8 (L) 12/10/2020 0630   GFRNONAA >60 12/10/2020 0630   GFRAA  12/14/2007 0440    >60        The eGFR has been calculated using the MDRD equation. This calculation has not been validated in all clinical    BNP No results found for: BNP  ProBNP No results found for:  PROBNP  Imaging: DG Chest 2 View  Result Date: 12/11/2020 CLINICAL DATA:  Hemoptysis EXAM: CHEST - 2 VIEW COMPARISON:  12/10/2020, CT chest 12/06/2020 FINDINGS: Right lower lobe lung mass. This appears slightly more dense compared to the radiograph on 12/10/2020. No pleural effusion. No pneumothorax. Normal cardiac size IMPRESSION: Redemonstrated right lower lobe lung mass. On AP view appears slightly more dense as compared with 12/10/2020, potentially due to acute superimposed airspace disease (?hemorrhage) Electronically Signed   By: Donavan Foil M.D.   On: 12/11/2020 16:23   NM PET Image Initial (PI) Skull Base To Thigh  Result Date: 11/29/2020 CLINICAL DATA:  Initial treatment strategy for pulmonary nodules. EXAM: NUCLEAR MEDICINE PET SKULL BASE TO THIGH TECHNIQUE: 10.9 mCi F-18 FDG was injected intravenously. Full-ring PET imaging was performed from the skull base to thigh after the radiotracer. CT data was obtained and used for attenuation correction and anatomic localization. Fasting blood glucose: 183 mg/dl COMPARISON:  CT chest from 11/02/2020 FINDINGS: Mediastinal blood pool activity: SUV max 2.6 Liver activity: SUV max NA NECK: No significant abnormal hypermetabolic activity in this region. Incidental CT findings: none CHEST: The rounded region of peripheral consolidation in the right lower lobe with some internal air bronchograms has a maximum SUV of 6.5, with the higher SUV especially centrally in the denser portion of the mass. The appearance certainly raises concern for potential internal malignancy. There is increasing interstitial accentuation in a bandlike configuration posteriorly in the right lower lobe shown on image 36 of series 8, new from 11/02/2020, with maximum SUV of 2.7. No hypermetabolic or pathologically enlarged adenopathy in the chest is identified. Incidental CT findings: Coronary artery and aortic atherosclerotic calcification. Emphysema. ABDOMEN/PELVIS: Scattered  segmental and multifocal bowel activity, much of which is likely physiologic and without abnormal findings on the CT data. Photopenic cyst of the left kidney upper pole. Incidental CT findings: Atherosclerosis is present, including aortoiliac atherosclerotic disease. Incidental lipoma along the left hip  adductor musculature. Nonspecific wall thickening in the stomach, but without hypermetabolic activity. SKELETON: No significant abnormal hypermetabolic activity in this region. Incidental CT findings: none IMPRESSION: 1. Similar appearance of the right lower lobe mass with a high density central component and peripheral airspace opacity. The high density central component as a maximum SUV of 6.5, which is certainly suspicious for malignancy although a granulomatous infection could have a similar appearance. Tissue diagnosis recommended. 2. There is a new band of interstitial accentuation in the adjacent right upper lobe with maximum SUV of 2.7, likely inflammatory. 3. Other imaging findings of potential clinical significance: Aortic Atherosclerosis (ICD10-I70.0) and Emphysema (ICD10-J43.9). Coronary atherosclerosis. Wall thickening in the stomach is not hypermetabolic but could be inflammatory. Electronically Signed   By: Van Clines M.D.   On: 11/29/2020 06:57   DG Chest Port 1 View  Result Date: 12/10/2020 CLINICAL DATA:  70 year old male status post bronchoscopy. EXAM: PORTABLE CHEST 1 VIEW COMPARISON:  Chest x-ray 09/25/2008. FINDINGS: Lung volumes are low. No consolidative airspace disease. No pleural effusions. No pneumothorax. Large right lower lobe mass (better demonstrated on prior chest CT 12/06/2020). Pulmonary vasculature and the cardiomediastinal silhouette are within normal limits. IMPRESSION: 1. No radiographic evidence of acute cardiopulmonary disease. 2. Large right lower lobe mass better demonstrated on prior chest CT. Electronically Signed   By: Vinnie Langton M.D.   On: 12/10/2020  09:11   CT Super D Chest Wo Contrast  Result Date: 12/06/2020 CLINICAL DATA:  Follow-up of right lower lobe lung mass. EXAM: CT CHEST WITHOUT CONTRAST TECHNIQUE: Multidetector CT imaging of the chest was performed using thin slice collimation for electromagnetic bronchoscopy planning purposes, without intravenous contrast. COMPARISON:  PET of 11/29/2020.  Chest CT of 11/02/2020 FINDINGS: Cardiovascular: Aortic atherosclerosis. Normal heart size, without pericardial effusion. Left main and 3 vessel coronary artery calcification. Mediastinum/Nodes: No mediastinal or definite hilar adenopathy, given limitations of unenhanced CT. Lungs/Pleura: No pleural fluid. Moderate centrilobular and paraseptal emphysema. Nodule along the right minor fissure is similar at 6 mm on 69/5, likely a subpleural lymph node. Right lower lobe mixed attenuation mass is again identified. At greatest transverse dimension, the solid component measures 8.4 x 6.5 cm on 85/5 versus 7.3 x 6.0 cm at the same level on 11/02/2020 (when remeasured). The extent of surrounding ground-glass opacity is relatively similar. Based on coronal reformats, the solid component measures 6.4 x 8.2 cm on 137/6 versus 7.1 x 6.9 cm on the prior exam. Upper Abdomen: Normal imaged portions of the liver, spleen, pancreas, gallbladder, adrenal glands, right kidney. Upper pole left renal 3.4 cm low-density lesion is likely a cyst. The proximal stomach is underdistended, but thick walled including at 3.1 cm on 56/2. Not significantly hypermetabolic on prior PET. Musculoskeletal: No acute osseous abnormality. IMPRESSION: 1. Persistent right lower lobe mixed attenuation lung mass, felt to be slightly enlarged compared to the CT of 1 month ago. This remains suspicious for primary bronchogenic carcinoma, likely adenocarcinoma. Given morphology, inflammatory etiologies such as organizing pneumonia are also considered. 2. No thoracic adenopathy. 3. Gastric wall thickening in  the setting of underdistention. Correlate with symptoms of gastritis or other gastric pathology. Consider endoscopy. 4. Aortic atherosclerosis (ICD10-I70.0), coronary artery atherosclerosis and emphysema (ICD10-J43.9). Electronically Signed   By: Abigail Miyamoto M.D.   On: 12/06/2020 18:20   DG C-ARM BRONCHOSCOPY  Result Date: 12/10/2020 C-ARM BRONCHOSCOPY: Fluoroscopy was utilized by the requesting physician.  No radiographic interpretation.     Assessment & Plan:   Hemoptysis - Post  bronchoscopy 12/10/20, CXR today showed right lower lobe lung mass, slightly more dense compared to 12/10/20, possible hemorrhage - image reviewed by Dr. Lamonte Sakai. Sending in [place] for possible superimposed infection and encourage cough suppression with Delsym. Advised he hold baby ASA 3 days.  Rash - Faint diffuse erythematous rash right hand/forearm. He had IV in his left arm for procedure yesterday.  - No concerning findings on exam; No fever, VSS. Checking CBC  Right lower lobe lung mass -  Biopsies pending, patient has apt with Dr. Lamonte Sakai in 1 month   Martyn Ehrich, NP 12/11/2020

## 2020-12-11 NOTE — Assessment & Plan Note (Addendum)
-   Post bronchoscopy 12/10/20, CXR today showed right lower lobe lung mass, slightly more dense compared to 12/10/20, possible hemorrhage - image reviewed by Dr. Lamonte Sakai. Sending in [place] for possible superimposed infection and encourage cough suppression with Delsym. Advised he hold baby ASA 3 days.

## 2020-12-11 NOTE — Telephone Encounter (Signed)
Please ask patient to come in 30 min early for CXR

## 2020-12-11 NOTE — Telephone Encounter (Signed)
The blood is probably to be expected. I'm not sure what to make out of the changes in his hand - can he come in to be seen by APP as an acute visit today?

## 2020-12-11 NOTE — Assessment & Plan Note (Signed)
-    Biopsies pending, patient has apt with Dr. Lamonte Sakai in 1 month

## 2020-12-11 NOTE — Telephone Encounter (Signed)
Thank you :)

## 2020-12-11 NOTE — Telephone Encounter (Signed)
Spoke with the pt Bronch done on 12/10/20  He states he has had hemoptysis since then- coughing up tsp worth of bright red "globs" of thick mucus several times per day  He states that he is not having any increased SOB or any fever, aches His right hand has turned red and he is concerned about this- fingers down to his wrist  He denies any pain, tingling, numbness  He states his hand has done this before when he has had "blood poisoning"  He takes asa 81 mg and has already taken this today  Please advise thanks

## 2020-12-12 ENCOUNTER — Encounter (HOSPITAL_COMMUNITY): Payer: Self-pay | Admitting: Emergency Medicine

## 2020-12-12 LAB — CYTOLOGY - NON PAP

## 2020-12-14 ENCOUNTER — Telehealth: Payer: Self-pay | Admitting: Primary Care

## 2020-12-14 ENCOUNTER — Telehealth: Payer: Self-pay | Admitting: Emergency Medicine

## 2020-12-14 DIAGNOSIS — R918 Other nonspecific abnormal finding of lung field: Secondary | ICD-10-CM

## 2020-12-14 NOTE — Telephone Encounter (Signed)
No changes. Thanks for update.

## 2020-12-14 NOTE — Telephone Encounter (Signed)
Call patient to discuss biopsy results.  Left a message on his voicemail.  We will call him back to review the specifics of his pathology.  I will go ahead and make referral for him to [facility] (done)

## 2020-12-14 NOTE — Telephone Encounter (Signed)
Called and spoke with pt and he stated that:  He did the cough meds and the zpak.  He stated that his cough has improved some but he is still coughing up some blood.  He did state that it is about half of what it was before.   He did stated that he is happy with his improvement.    He did state that his right arm is better but not completely healed.  It is still red.    He wanted to call and make BW aware.  Please advise. thanks

## 2020-12-14 NOTE — Telephone Encounter (Signed)
I was able to reach the patient, discussed path results w him. He agrees with referral to [facility].   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 Unfortunately the cells were scant on the cell block. Not enough for molecular testing. ? Whether he may need repeat FOB or a TTNA. Probably the latter, as I felt we got as much as we could via FOB

## 2020-12-14 NOTE — Telephone Encounter (Signed)
Will sign off message.  Noted.

## 2020-12-17 ENCOUNTER — Telehealth: Payer: Self-pay | Admitting: *Deleted

## 2020-12-17 ENCOUNTER — Telehealth: Payer: Self-pay

## 2020-12-17 ENCOUNTER — Other Ambulatory Visit: Payer: Self-pay

## 2020-12-17 ENCOUNTER — Ambulatory Visit (AMBULATORY_SURGERY_CENTER): Payer: Medicare Other

## 2020-12-17 VITALS — Ht 72.0 in | Wt 219.0 lb

## 2020-12-17 DIAGNOSIS — Z800 Family history of malignant neoplasm of digestive organs: Secondary | ICD-10-CM

## 2020-12-17 DIAGNOSIS — Z86010 Personal history of colonic polyps: Secondary | ICD-10-CM

## 2020-12-17 DIAGNOSIS — R918 Other nonspecific abnormal finding of lung field: Secondary | ICD-10-CM

## 2020-12-17 MED ORDER — PEG 3350-KCL-NA BICARB-NACL 420 G PO SOLR: 4000.0000 mL | Freq: Once | ORAL | 0 refills | Status: AC

## 2020-12-17 NOTE — Telephone Encounter (Signed)
I received referral on Brandon Rubio. I called patient and set him up to see Dr. Julien Nordmann.  He verbalized understanding of appt.

## 2020-12-17 NOTE — Progress Notes (Signed)
No egg or soy allergy known to patient  No issues known to pt with past sedation with any surgeries or procedures Patient denies ever being told they had issues or difficulty with intubation  No FH of Malignant Hyperthermia Pt is not on diet pills Pt is not on  home 02  Pt is not on blood thinners  Pt denies issues with constipation  No A fib or A flutter  Pt is fully vaccinated  for Covid   Pt had a bronchoscopy on 12/10/20, states new dx with lung ca, states his dr said it was "slow growing" and they are going to speak with him today regarding treatment plans, let pt know to tell them he is scheduled for the colonoscopy and to let us know if they do not want him to proceed with the colonoscopy at this time.  Pt verb understanding .    NO PA's for preps discussed with pt In [facility] today  Discussed with pt there will be an out-of-pocket cost for prep and that varies from $0 to 70 +  dollars - pt verbalized understanding   Due to the COVID-19 pandemic we are asking patients to follow certain guidelines in [place] and the [place]   Pt aware of COVID protocols and [facility] guidelines

## 2020-12-17 NOTE — Telephone Encounter (Signed)
Ok Dr Ardis Hughs, I will call him and make sure he is not starting any treatment prior to the colonoscopy.  He stated he really did not want to postpone the colonoscopy, so I think he would delay the treatment if needed.

## 2020-12-17 NOTE — Telephone Encounter (Signed)
Thank you, Jenny Reichmann!

## 2020-12-20 ENCOUNTER — Encounter: Payer: Self-pay | Admitting: *Deleted

## 2020-12-20 ENCOUNTER — Other Ambulatory Visit: Payer: Self-pay | Admitting: *Deleted

## 2020-12-20 NOTE — Progress Notes (Signed)
The proposed treatment discussed in conference is for discussion purpose only and is not a binding recommendation. The patient was not been physically examined, or presented with their treatment options. Therefore, final treatment plans cannot be decided.  

## 2020-12-21 NOTE — Telephone Encounter (Signed)
Pt states he will see the oncologist on 12/24/20 and he will not start any treatment prior to 10/24 colonoscopy, let pt know to call and cancel if he does have any treatment, pt verb understanding.

## 2020-12-24 ENCOUNTER — Other Ambulatory Visit: Payer: Self-pay

## 2020-12-24 ENCOUNTER — Encounter: Payer: Self-pay | Admitting: Internal Medicine

## 2020-12-24 ENCOUNTER — Inpatient Hospital Stay: Payer: Medicare Other | Attending: Internal Medicine | Admitting: Internal Medicine

## 2020-12-24 ENCOUNTER — Inpatient Hospital Stay: Payer: Medicare Other

## 2020-12-24 VITALS — BP 130/73 | HR 78 | Temp 97.8°F | Resp 20 | Ht 72.0 in | Wt 217.4 lb

## 2020-12-24 DIAGNOSIS — C3490 Malignant neoplasm of unspecified part of unspecified bronchus or lung: Secondary | ICD-10-CM

## 2020-12-24 DIAGNOSIS — F17210 Nicotine dependence, cigarettes, uncomplicated: Secondary | ICD-10-CM | POA: Diagnosis not present

## 2020-12-24 DIAGNOSIS — C3491 Malignant neoplasm of unspecified part of right bronchus or lung: Secondary | ICD-10-CM | POA: Insufficient documentation

## 2020-12-24 DIAGNOSIS — C3431 Malignant neoplasm of lower lobe, right bronchus or lung: Secondary | ICD-10-CM | POA: Insufficient documentation

## 2020-12-24 DIAGNOSIS — R918 Other nonspecific abnormal finding of lung field: Secondary | ICD-10-CM

## 2020-12-24 LAB — CMP (CANCER CENTER ONLY)
ALT: 17 U/L (ref 0–44)
AST: 14 U/L — ABNORMAL LOW (ref 15–41)
Albumin: 4.1 g/dL (ref 3.5–5.0)
Alkaline Phosphatase: 62 U/L (ref 38–126)
Anion gap: 11 (ref 5–15)
BUN: 14 mg/dL (ref 8–23)
CO2: 21 mmol/L — ABNORMAL LOW (ref 22–32)
Calcium: 9.3 mg/dL (ref 8.9–10.3)
Chloride: 107 mmol/L (ref 98–111)
Creatinine: 0.96 mg/dL (ref 0.61–1.24)
GFR, Estimated: >60 mL/min (ref >60)
Glucose, Bld: 201 mg/dL — ABNORMAL HIGH (ref 70–99)
Potassium: 4 mmol/L (ref 3.5–5.1)
Sodium: 139 mmol/L (ref 135–145)
Total Bilirubin: 0.5 mg/dL (ref 0.3–1.2)
Total Protein: 7.1 g/dL (ref 6.5–8.1)

## 2020-12-24 LAB — CBC WITH DIFFERENTIAL (CANCER CENTER ONLY)
Abs Immature Granulocytes: 0.02 10*3/uL (ref 0.00–0.07)
Basophils Absolute: 0.1 10*3/uL (ref 0.0–0.1)
Basophils Relative: 1 %
Eosinophils Absolute: 0.2 10*3/uL (ref 0.0–0.5)
Eosinophils Relative: 3 %
HCT: 43.1 % (ref 39.0–52.0)
Hemoglobin: 15 g/dL (ref 13.0–17.0)
Immature Granulocytes: 0 %
Lymphocytes Relative: 33 %
Lymphs Abs: 2.6 10*3/uL (ref 0.7–4.0)
MCH: 30.2 pg (ref 26.0–34.0)
MCHC: 34.8 g/dL (ref 30.0–36.0)
MCV: 86.7 fL (ref 80.0–100.0)
Monocytes Absolute: 0.5 10*3/uL (ref 0.1–1.0)
Monocytes Relative: 7 %
Neutro Abs: 4.4 10*3/uL (ref 1.7–7.7)
Neutrophils Relative %: 56 %
Platelet Count: 218 10*3/uL (ref 150–400)
RBC: 4.97 MIL/uL (ref 4.22–5.81)
RDW: 12.7 % (ref 11.5–15.5)
WBC Count: 7.8 10*3/uL (ref 4.0–10.5)
nRBC: 0 % (ref 0.0–0.2)

## 2020-12-24 NOTE — Telephone Encounter (Signed)
Called patient-patient reports he is not going to start any treatment until after the colonoscopy-patient advised to call back to the office should these plans change- patient verbalized understanding of information;

## 2020-12-24 NOTE — Patient Instructions (Signed)
Steps to Quit Smoking Smoking tobacco is the leading cause of preventable death. It can affect almost every organ in the body. Smoking puts you and people around you at risk for many serious, long-lasting (chronic) diseases. Quitting smoking can be hard, but it is one of the best things that you can do for your health. It is never too late to quit. How do I get ready to quit? When you decide to quit smoking, make a plan to help you succeed. Before you quit: Pick a date to quit. Set a date within the next 2 weeks to give you time to prepare. Write down the reasons why you are quitting. Keep this list in places where you will see it often. Tell your family, friends, and co-workers that you are quitting. Their support is important. Talk with your doctor about the choices that may help you quit. Find out if your health insurance will pay for these treatments. Know the people, places, things, and activities that make you want to smoke (triggers). Avoid them. What first steps can I take to quit smoking? Throw away all cigarettes at home, at work, and in your car. Throw away the things that you use when you smoke, such as ashtrays and lighters. Clean your car. Make sure to empty the ashtray. Clean your home, including curtains and carpets. What can I do to help me quit smoking? Talk with your doctor about taking medicines and seeing a counselor at the same time. You are more likely to succeed when you do both. If you are pregnant or breastfeeding, talk with your doctor about counseling or other ways to quit smoking. Do not take medicine to help you quit smoking unless your doctor tells you to do so. To quit smoking: Quit right away Quit smoking totally, instead of slowly cutting back on how much you smoke over a period of time. Go to counseling. You are more likely to quit if you go to counseling sessions regularly. Take medicine You may take medicines to help you quit. Some medicines need a  prescription, and some you can buy over-the-counter. Some medicines may contain a drug called nicotine to replace the nicotine in cigarettes. Medicines may: Help you to stop having the desire to smoke (cravings). Help to stop the problems that come when you stop smoking (withdrawal symptoms). Your doctor may ask you to use: Nicotine patches, gum, or lozenges. Nicotine inhalers or sprays. Non-nicotine medicine that is taken by mouth. Find resources Find resources and other ways to help you quit smoking and remain smoke-free after you quit. These resources are most helpful when you use them often. They include: Online chats with a counselor. Phone quitlines. Printed self-help materials. Support groups or group counseling. Text messaging programs. Mobile phone apps. Use apps on your mobile phone or tablet that can help you stick to your quit plan. There are many free apps for mobile phones and tablets as well as websites. Examples include Quit Guide from the CDC and smokefree.gov  What things can I do to make it easier to quit?  Talk to your family and friends. Ask them to support and encourage you. Call a phone quitline (1-800-QUIT-NOW), reach out to support groups, or work with a counselor. Ask people who smoke to not smoke around you. Avoid places that make you want to smoke, such as: Bars. Parties. Smoke-break areas at work. Spend time with people who do not smoke. Lower the stress in your life. Stress can make you want to   smoke. Try these things to help your stress: Getting regular exercise. Doing deep-breathing exercises. Doing yoga. Meditating. Doing a body scan. To do this, close your eyes, focus on one area of your body at a time from head to toe. Notice which parts of your body are tense. Try to relax the muscles in those areas. How will I feel when I quit smoking? Day 1 to 3 weeks Within the first 24 hours, you may start to have some problems that come from quitting tobacco.  These problems are very bad 2-3 days after you quit, but they do not often last for more than 2-3 weeks. You may get these symptoms: Mood swings. Feeling restless, nervous, angry, or annoyed. Trouble concentrating. Dizziness. Strong desire for high-sugar foods and nicotine. Weight gain. Trouble pooping (constipation). Feeling like you may vomit (nausea). Coughing or a sore throat. Changes in how the medicines that you take for other issues work in your body. Depression. Trouble sleeping (insomnia). Week 3 and afterward After the first 2-3 weeks of quitting, you may start to notice more positive results, such as: Better sense of smell and taste. Less coughing and sore throat. Slower heart rate. Lower blood pressure. Clearer skin. Better breathing. Fewer sick days. Quitting smoking can be hard. Do not give up if you fail the first time. Some people need to try a few times before they succeed. Do your best to stick to your quit plan, and talk with your doctor if you have any questions or concerns. Summary Smoking tobacco is the leading cause of preventable death. Quitting smoking can be hard, but it is one of the best things that you can do for your health. When you decide to quit smoking, make a plan to help you succeed. Quit smoking right away, not slowly over a period of time. When you start quitting, seek help from your doctor, family, or friends. This information is not intended to replace advice given to you by your health care provider. Make sure you discuss any questions you have with your health care provider. Document Revised: 11/19/2018 Document Reviewed: 05/15/2018 Elsevier Patient Education  2022 Elsevier Inc.  

## 2020-12-24 NOTE — Progress Notes (Signed)
Brandon Rubio:([phone]   Fax:([phone]) [phone]  CONSULT NOTE  REFERRING PHYSICIAN: Dr. Baltazar Apo  REASON FOR CONSULTATION:  70 years old white male recently diagnosed with lung cancer.  HPI Brandon Rubio. is a 70 y.o. male with past medical history significant for coronary artery disease, diabetes mellitus, hypertension, dyslipidemia, right lower extremity deep venous thrombosis after ankle fracture, peripheral neuropathy, skin cancer and long history of smoking.  The patient also had pneumothorax in his 68s that required chest tube placement at that time.  Because of the long history of his smoking he had CT screening of the chest on 11/02/2020 and that showed a large part solid mass within the posterior right lower lobe measuring 7.26 cm and the solid component measuring 5.7 cm.  He had a PET scan on 11/28/2020 and that showed similar appearance of the right lower lobe mass with hilar density central component and peripheral airspace opacity.  The high density central component has a maximum SUV of 6.5 suspicious for malignancy.  There was a new band of interstitial accentuation in the adjacent right upper lobe.  No evidence of metastatic disease outside the chest.  The patient was seen by Dr. Lamonte Sakai and on 12/10/2020 he underwent video bronchoscopy with robotic assisted bronchoscopic navigation and biopsy of the right lower lobe lung mass. The final pathology ([id number]) showed malignant cells consistent with non-small cell carcinoma. The morphology of malignant cells favors adenocarcinoma.  Only very rare tumor cells are present on cell block - insufficient immunohistochemical stains are molecular studies.  Dr. Lamonte Sakai kindly referred the patient to me today for evaluation and recommendation regarding his condition. When seen today the patient is feeling fine with no concerning complaints except for the persistent neck pain that he had for several years.  He  was evaluated in the past by Dr. Wilburn Cornelia and no concerning findings.  He also had ultrasound of the neck that was unremarkable.  He has no chest pain but has shortness of breath on exertion with no cough or hemoptysis.  He denied having any weight loss or night sweats.  He has no nausea, vomiting, diarrhea or constipation.  He has no headache or visual changes. Family history significant for father with questionable cancer.  Mother had lung cancer and brother had throat cancer both of them were my patient in the past. The patient is single and has no children.  He is a retired Chief Financial Officer.  The patient has a long history of his smoking up to 2 packs/day for around 60 years and he continues to smoke and trying to quit.  He also has a history of alcohol abuse but quit 9 years ago.  He has no history of drug abuse.  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   Peripheral neuropathy    toes    Past Surgical History:  Procedure Laterality Date   BRONCHIAL BIOPSY  12/10/2020   Procedure: BRONCHIAL BIOPSIES;  Surgeon: Collene Gobble, MD;  Location: [facility] ENDOSCOPY;  Service: Pulmonary;;   BRONCHIAL BRUSHINGS  12/10/2020  Procedure: BRONCHIAL BRUSHINGS;  Surgeon: Collene Gobble, MD;  Location: [facility] ENDOSCOPY;  Service: Pulmonary;;   BRONCHIAL NEEDLE ASPIRATION BIOPSY  12/10/2020   Procedure: BRONCHIAL NEEDLE ASPIRATION BIOPSIES;  Surgeon: Collene Gobble, MD;  Location: [facility] ENDOSCOPY;  Service: Pulmonary;;   COLONOSCOPY     COLONOSCOPY W/ POLYPECTOMY  03/10/2009   LARYNGOSCOPY  03/10/1997   VIDEO BRONCHOSCOPY WITH ENDOBRONCHIAL NAVIGATION N/A 12/10/2020    Procedure: ROBOTIC VIDEO BRONCHOSCOPY WITH ENDOBRONCHIAL NAVIGATION;  Surgeon: Collene Gobble, MD;  Location: [place] ENDOSCOPY;  Service: Pulmonary;  Laterality: N/A;    Family History  Problem Relation Age of Onset   Lung cancer Mother        X 2   Colon polyps Father    Colon cancer Father 47   Coronary artery disease Father    Lung disease Father    Esophageal cancer Neg Hx    Stomach cancer Neg Hx    Rectal cancer Neg Hx     Social History Social History   Tobacco Use   Smoking status: Every Day    Packs/day: 2.00    Years: 58.00    Pack years: 116.00    Types: Cigarettes   Smokeless tobacco: Never   Tobacco comments:    2+ packs smoked daily ARJ 12/05/20  Vaping Use   Vaping Use: Never used  Substance Use Topics   Alcohol use: No    Alcohol/week: 0.0 standard drinks    Comment:  none since 04/2012   Drug use: Never    Allergies  Allergen Reactions   Penicillins Swelling    REACTION: throat swelling Because of a history of documented adverse serious drug reaction;[name] Alert bracelet  is recommended    Current Outpatient Medications  Medication Sig Dispense Refill   aspirin EC 81 MG tablet Take 81 mg by mouth in the morning. Swallow whole. (Patient not taking: Reported on 12/17/2020)     azithromycin (ZITHROMAX) 250 MG tablet Zpack taper as directed 6 tablet 0   empagliflozin (JARDIANCE) 25 MG TABS tablet Take 1 tablet (25 mg total) by mouth daily. 90 tablet 3   glucose blood (FREESTYLE LITE) test strip USE TO TEST BLOOD SUGAR TWICE DAILY ICD E11.49 (Patient not taking: Reported on 12/17/2020) 100 each 3   metFORMIN (GLUCOPHAGE) 1000 MG tablet TAKE 1 TABLET BY MOUTH TWICE DAILY WITH A MEAL 180 tablet 1   metoprolol succinate (TOPROL-XL) 50 MG 24 hr tablet TAKE 1 TABLET BY MOUTH EVERY DAY. 90 tablet 0   Multiple Vitamin (MULTIVITAMIN WITH MINERALS) TABS tablet Take 1 tablet by mouth daily. Centrum Silver     omeprazole (PRILOSEC) 20 MG capsule TAKE 1 CAPSULE(20 MG)  BY MOUTH TWICE DAILY BEFORE A MEAL 180 capsule 3   simvastatin (ZOCOR) 40 MG tablet Take 1 tablet (40 mg total) by mouth daily at 6 PM. 90 tablet 3   traZODone (DESYREL) 50 MG tablet Take 1 tablet (50 mg total) by mouth at bedtime as needed for sleep. (Patient not taking: Reported on 12/17/2020)     triamcinolone cream (KENALOG) 0.1 % Apply 1 application topically 2 (two) times daily as needed (poison ivy).     No current facility-administered medications for this visit.    Review of Systems  Constitutional: negative Eyes: negative Ears, nose, mouth, throat, and face: positive for neck pain. Respiratory: positive for dyspnea on exertion Cardiovascular: negative Gastrointestinal: negative Genitourinary:negative Integument/breast: negative Hematologic/lymphatic: negative Musculoskeletal:positive for neck pain Neurological: negative Behavioral/Psych: negative Endocrine: negative Allergic/Immunologic: negative  Physical Exam  WHQ:PRFFM, healthy, no distress, well nourished, well developed, and anxious SKIN: skin color, texture, turgor are normal, no rashes or significant lesions HEAD: Normocephalic, No masses, lesions, tenderness or abnormalities EYES: normal, PERRLA, Conjunctiva are pink and non-injected EARS: External ears normal, Canals clear OROPHARYNX:no exudate, no erythema, and lips, buccal mucosa, and tongue normal  NECK: supple, no adenopathy, no JVD LYMPH:  no palpable lymphadenopathy, no hepatosplenomegaly LUNGS: clear to auscultation , and palpation HEART: regular rate & rhythm, no murmurs, and no gallops ABDOMEN:abdomen soft, non-tender, normal bowel sounds, and no masses or organomegaly BACK: No CVA tenderness, Range of motion is normal EXTREMITIES:no joint deformities, effusion, or inflammation, no edema  NEURO: alert & oriented x 3 with fluent speech, no focal motor/sensory deficits  PERFORMANCE STATUS: ECOG 1  LABORATORY DATA: Lab Results  Component Value  Date   WBC 7.8 12/24/2020   HGB 15.0 12/24/2020   HCT 43.1 12/24/2020   MCV 86.7 12/24/2020   PLT 218 12/24/2020      Chemistry      Component Value Date/Time   NA 139 12/24/2020 1039   K 4.0 12/24/2020 1039   CL 107 12/24/2020 1039   CO2 21 (L) 12/24/2020 1039   BUN 14 12/24/2020 1039   CREATININE 0.96 12/24/2020 1039      Component Value Date/Time   CALCIUM 9.3 12/24/2020 1039   ALKPHOS 62 12/24/2020 1039   AST 14 (L) 12/24/2020 1039   ALT 17 12/24/2020 1039   BILITOT 0.5 12/24/2020 1039       RADIOGRAPHIC STUDIES: DG Chest 2 View  Result Date: 12/11/2020 CLINICAL DATA:  Hemoptysis EXAM: CHEST - 2 VIEW COMPARISON:  12/10/2020, CT chest 12/06/2020 FINDINGS: Right lower lobe lung mass. This appears slightly more dense compared to the radiograph on 12/10/2020. No pleural effusion. No pneumothorax. Normal cardiac size IMPRESSION: Redemonstrated right lower lobe lung mass. On AP view appears slightly more dense as compared with 12/10/2020, potentially due to acute superimposed airspace disease (?hemorrhage) Electronically Signed   By: Donavan Foil M.D.   On: 12/11/2020 16:23   NM PET Image Initial (PI) Skull Base To Thigh  Result Date: 11/29/2020 CLINICAL DATA:  Initial treatment strategy for pulmonary nodules. EXAM: NUCLEAR MEDICINE PET SKULL BASE TO THIGH TECHNIQUE: 10.9 mCi F-18 FDG was injected intravenously. Full-ring PET imaging was performed from the skull base to thigh after the radiotracer. CT data was obtained and used for attenuation correction and anatomic localization. Fasting blood glucose: 183 mg/dl COMPARISON:  CT chest from 11/02/2020 FINDINGS: Mediastinal blood pool activity: SUV max 2.6 Liver activity: SUV max NA NECK: No significant abnormal hypermetabolic activity in this region. Incidental CT findings: none CHEST: The rounded region of peripheral consolidation in the right lower lobe with some internal air bronchograms has a maximum SUV of 6.5, with the higher  SUV especially centrally in the denser portion of the mass. The appearance certainly raises concern for potential internal malignancy. There is increasing interstitial accentuation in a bandlike configuration posteriorly in the right lower lobe shown on image 36 of series 8, new from 11/02/2020, with maximum SUV of 2.7. No hypermetabolic or pathologically enlarged adenopathy in the chest is identified. Incidental CT findings: Coronary artery and aortic atherosclerotic calcification. Emphysema. ABDOMEN/PELVIS: Scattered segmental and multifocal bowel activity, much of which is likely physiologic and without abnormal findings on the CT data. Photopenic cyst of the left kidney upper pole. Incidental CT findings: Atherosclerosis is present, including aortoiliac atherosclerotic disease. Incidental lipoma along the  left hip adductor musculature. Nonspecific wall thickening in the stomach, but without hypermetabolic activity. SKELETON: No significant abnormal hypermetabolic activity in this region. Incidental CT findings: none IMPRESSION: 1. Similar appearance of the right lower lobe mass with a high density central component and peripheral airspace opacity. The high density central component as a maximum SUV of 6.5, which is certainly suspicious for malignancy although a granulomatous infection could have a similar appearance. Tissue diagnosis recommended. 2. There is a new band of interstitial accentuation in the adjacent right upper lobe with maximum SUV of 2.7, likely inflammatory. 3. Other imaging findings of potential clinical significance: Aortic Atherosclerosis (ICD10-I70.0) and Emphysema (ICD10-J43.9). Coronary atherosclerosis. Wall thickening in the stomach is not hypermetabolic but could be inflammatory. Electronically Signed   By: Van Clines M.D.   On: 11/29/2020 06:57   DG Chest Port 1 View  Result Date: 12/10/2020 CLINICAL DATA:  70 year old male status post bronchoscopy. EXAM: PORTABLE CHEST 1  VIEW COMPARISON:  Chest x-ray 09/25/2008. FINDINGS: Lung volumes are low. No consolidative airspace disease. No pleural effusions. No pneumothorax. Large right lower lobe mass (better demonstrated on prior chest CT 12/06/2020). Pulmonary vasculature and the cardiomediastinal silhouette are within normal limits. IMPRESSION: 1. No radiographic evidence of acute cardiopulmonary disease. 2. Large right lower lobe mass better demonstrated on prior chest CT. Electronically Signed   By: Vinnie Langton M.D.   On: 12/10/2020 09:11   CT Super D Chest Wo Contrast  Result Date: 12/06/2020 CLINICAL DATA:  Follow-up of right lower lobe lung mass. EXAM: CT CHEST WITHOUT CONTRAST TECHNIQUE: Multidetector CT imaging of the chest was performed using thin slice collimation for electromagnetic bronchoscopy planning purposes, without intravenous contrast. COMPARISON:  PET of 11/29/2020.  Chest CT of 11/02/2020 FINDINGS: Cardiovascular: Aortic atherosclerosis. Normal heart size, without pericardial effusion. Left main and 3 vessel coronary artery calcification. Mediastinum/Nodes: No mediastinal or definite hilar adenopathy, given limitations of unenhanced CT. Lungs/Pleura: No pleural fluid. Moderate centrilobular and paraseptal emphysema. Nodule along the right minor fissure is similar at 6 mm on 69/5, likely a subpleural lymph node. Right lower lobe mixed attenuation mass is again identified. At greatest transverse dimension, the solid component measures 8.4 x 6.5 cm on 85/5 versus 7.3 x 6.0 cm at the same level on 11/02/2020 (when remeasured). The extent of surrounding ground-glass opacity is relatively similar. Based on coronal reformats, the solid component measures 6.4 x 8.2 cm on 137/6 versus 7.1 x 6.9 cm on the prior exam. Upper Abdomen: Normal imaged portions of the liver, spleen, pancreas, gallbladder, adrenal glands, right kidney. Upper pole left renal 3.4 cm low-density lesion is likely a cyst. The proximal stomach is  underdistended, but thick walled including at 3.1 cm on 56/2. Not significantly hypermetabolic on prior PET. Musculoskeletal: No acute osseous abnormality. IMPRESSION: 1. Persistent right lower lobe mixed attenuation lung mass, felt to be slightly enlarged compared to the CT of 1 month ago. This remains suspicious for primary bronchogenic carcinoma, likely adenocarcinoma. Given morphology, inflammatory etiologies such as organizing pneumonia are also considered. 2. No thoracic adenopathy. 3. Gastric wall thickening in the setting of underdistention. Correlate with symptoms of gastritis or other gastric pathology. Consider endoscopy. 4. Aortic atherosclerosis (ICD10-I70.0), coronary artery atherosclerosis and emphysema (ICD10-J43.9). Electronically Signed   By: Abigail Miyamoto M.D.   On: 12/06/2020 18:20   DG C-ARM BRONCHOSCOPY  Result Date: 12/10/2020 C-ARM BRONCHOSCOPY: Fluoroscopy was utilized by the requesting physician.  No radiographic interpretation.    ASSESSMENT: This is a very pleasant 70  years old white male recently diagnosed with stage IIIb (T4, N0, M0) non-small cell lung cancer, favoring adenocarcinoma presented with large right lower lobe lung mass with no mediastinal lymphadenopathy or extrathoracic metastasis pending MRI of the brain to rule out brain metastasis diagnosed in October 2022.   PLAN: I had a lengthy discussion with the patient today about his current disease stage, prognosis and treatment options.  I personally and independently reviewed the scan images and discussed the result and showed the images to the patient today. I recommended for the patient to complete the staging work-up by ordering MRI of the brain to rule out brain metastasis. I explained to the patient that he may have incurable condition if he is a surgical candidate for resection.  I will refer him to cardiothoracic surgery for evaluation but if needed we may consider The patient for neoadjuvant course of  chemotherapy with immunotherapy for 3 cycles followed by evaluation for surgical resection. If the patient is not a surgical candidate, he may benefit from a course of concurrent chemoradiation with weekly carboplatin and paclitaxel. For the smoking cessation I strongly encouraged the patient to quit smoking. I will see him back for follow-up visit in around 3 weeks for evaluation and more discussion of his treatment options after his visit with cardiothoracic surgery. There was insufficient material for molecular testing but if needed we would consider The patient for blood test for molecular studies if his not a surgical candidate. The patient was advised to call immediately if he has any other concerning symptoms in the interval.  The patient voices understanding of current disease status and treatment options and is in agreement with the current care plan.  All questions were answered. The patient knows to call the clinic with any problems, questions or concerns. We can certainly see the patient much sooner if necessary.  Thank you so much for allowing me to participate in the care of Brandon Rubio.. I will continue to follow up the patient with you and assist in his care.  The total time spent in the appointment was 60 minutes.  Disclaimer: This note was dictated with voice recognition software. Similar sounding words can inadvertently be transcribed and may not be corrected upon review.   Eilleen Kempf December 24, 2020, 11:52 AM

## 2020-12-25 ENCOUNTER — Telehealth: Payer: Self-pay | Admitting: Internal Medicine

## 2020-12-25 NOTE — Telephone Encounter (Signed)
Scheduled follow-up appointment per 10/17 los. Patient is aware.

## 2020-12-27 ENCOUNTER — Ambulatory Visit (INDEPENDENT_AMBULATORY_CARE_PROVIDER_SITE_OTHER): Payer: Medicare Other | Admitting: Pharmacist

## 2020-12-27 ENCOUNTER — Other Ambulatory Visit: Payer: Self-pay

## 2020-12-27 DIAGNOSIS — E1169 Type 2 diabetes mellitus with other specified complication: Secondary | ICD-10-CM

## 2020-12-27 DIAGNOSIS — E1165 Type 2 diabetes mellitus with hyperglycemia: Secondary | ICD-10-CM

## 2020-12-27 DIAGNOSIS — I10 Essential (primary) hypertension: Secondary | ICD-10-CM

## 2020-12-27 DIAGNOSIS — I2510 Atherosclerotic heart disease of native coronary artery without angina pectoris: Secondary | ICD-10-CM

## 2020-12-27 DIAGNOSIS — F17200 Nicotine dependence, unspecified, uncomplicated: Secondary | ICD-10-CM

## 2020-12-27 DIAGNOSIS — F5101 Primary insomnia: Secondary | ICD-10-CM

## 2020-12-27 NOTE — Progress Notes (Signed)
Chronic Care Management Pharmacy Note  12/28/2020 Name:  Brandon Rubio. MRN:  [id number] DOB:  Sep 02, 1950   Summary: -Pt reports trazodone 50 mg did not help much with staying asleep -Pt is ready to try to quit smoking; counseled on nicotine patch use  Recommendations/Changes made from today's visit: -Increase trazodone to 100 mg (50 mg x 2) nightly -Try adding melatonin 5 mg after a week if increased trazodone does not help -Call [place]Quit[facility] for Nicotine patches (21 mg/hr dose)   Subjective: Brandon Dobbs. is an 70 y.o. year old male who is a primary patient of [name], MD.  The CCM team was consulted for assistance with disease management and care coordination needs.    Engaged with patient by telephone for follow up visit in response to provider referral for pharmacy case management and/or care coordination services.   Consent to Services:  The patient was given information about Chronic Care Management services, agreed to services, and gave verbal consent prior to initiation of services.  Please see initial visit note for detailed documentation.   Patient Care Team: [name], MD as PCP - General (Internal Medicine) Milus Banister, MD as Attending Physician (Gastroenterology) Charlton Haws, [facility] as Pharmacist (Pharmacist)  Recent office visits: 10/02/20 Dr [name] OV: A1c 8.3 (from 8.7). c/o neck swelling/pain. Ordered US neck. Rx trazodone PRN for sleep. Pt declined adding DM meds, declined nutrition referral. Referred for LDCT. Offered change simvastatin to atorvastatin for high Trig, pt declined.  06/14/20 Dr [name] OV: chronic f/u. A1c 8.7. Continue metformin 1000 mg BID, increase simvastatin to 40 mg (whole tablet)  Recent consult visits: 12/24/20 Dr Julien Nordmann (oncology): eval for lung cancer, stage 3b. Referred to CT surgery. Advised smoking cessation.  12/10/20 admission for bronchoscopy. Pathology results - c/w NSCLC.   12/05/20 Dr Lamonte Sakai (pulmonary): RLL lung mass - asymptomatic, suspicious for adenocarcinoma. Advised bronchoscopy and tissue dx. Push tobacco cessation.  10/29/20 NP Groce (pulmonary): lung cancer screening. Ct showed mass in RUL. Needs PET scan and PFTs.  Hospital visits: None in previous 6 months  Objective:  Lab Results  Component Value Date   CREATININE 0.96 12/24/2020   BUN 14 12/24/2020   GFR 77.05 10/02/2020   GFRNONAA >60 12/24/2020   GFRAA  12/14/2007    >60        The eGFR has been calculated using the MDRD equation. This calculation has not been validated in all clinical   NA 139 12/24/2020   K 4.0 12/24/2020   CALCIUM 9.3 12/24/2020   CO2 21 (L) 12/24/2020   GLUCOSE 201 (H) 12/24/2020    Lab Results  Component Value Date/Time   HGBA1C 8.3 (A) 10/02/2020 08:09 AM   HGBA1C 8.7 (H) 06/14/2020 10:16 AM   HGBA1C 9.0 (H) 01/27/2019 10:22 AM   GFR 77.05 10/02/2020 08:26 AM   GFR 79.15 06/14/2020 10:16 AM   MICROALBUR 1.4 06/14/2020 10:16 AM   MICROALBUR 0.8 08/11/2016 09:55 AM    Last diabetic Eye exam:  Lab Results  Component Value Date/Time   HMDIABEYEEXA No Retinopathy 06/28/2020 10:45 AM    Last diabetic Foot exam: No results found for: HMDIABFOOTEX   Lab Results  Component Value Date   CHOL 129 10/02/2020   HDL 35.30 (L) 10/02/2020   LDLCALC 50 11/22/2012   LDLDIRECT 62.0 10/02/2020   TRIG 258.0 (H) 10/02/2020   CHOLHDL 4 10/02/2020    Hepatic Function Latest Ref Rng & Units 12/24/2020 10/02/2020 06/14/2020  Total Protein  6.5 - 8.1 g/dL 7.1 7.5 7.1  Albumin 3.5 - 5.0 g/dL 4.1 4.7 4.6  AST 15 - 41 U/L 14(L) 16 16  ALT 0 - 44 U/L '17 19 21  ' Alk Phosphatase 38 - 126 U/L 62 53 62  Total Bilirubin 0.3 - 1.2 mg/dL 0.5 0.5 0.4  Bilirubin, Direct 0.0 - 0.3 mg/dL - - -    Lab Results  Component Value Date/Time   TSH 1.22 06/22/2014 08:10 AM   TSH 1.14 08/24/2012 10:03 AM    CBC Latest Ref Rng & Units 12/24/2020 06/14/2020 01/27/2019  WBC 4.0 - 10.5  K/uL 7.8 8.1 8.7  Hemoglobin 13.0 - 17.0 g/dL 15.0 15.7 16.1  Hematocrit 39.0 - 52.0 % 43.1 45.9 47.3  Platelets 150 - 400 K/uL 218 213.0 224.0    No results found for: VD25OH  Clinical ASCVD: Yes  The ASCVD Risk score (Arnett DK, et al., 2019) failed to calculate for the following reasons:   The valid total cholesterol range is 130 to 320 mg/dL    Depression screen [facility] 2/9 06/14/2020 01/27/2019 09/02/2017  Decreased Interest 0 0 0  Down, Depressed, Hopeless 0 0 0  PHQ - 2 Score 0 0 0     Social History   Tobacco Use  Smoking Status Every Day   Packs/day: 2.00   Years: 58.00   Pack years: 116.00   Types: Cigarettes  Smokeless Tobacco Never  Tobacco Comments   2+ packs smoked daily ARJ 12/05/20   BP Readings from Last 3 Encounters:  12/24/20 130/73  12/11/20 122/70  12/10/20 (!) 144/67   Pulse Readings from Last 3 Encounters:  12/24/20 78  12/11/20 79  12/10/20 80   Wt Readings from Last 3 Encounters:  12/24/20 217 lb 6.4 oz (98.6 kg)  12/17/20 219 lb (99.3 kg)  12/11/20 220 lb 12.8 oz (100.2 kg)   BMI Readings from Last 3 Encounters:  12/24/20 29.48 kg/m  12/17/20 29.70 kg/m  12/11/20 29.95 kg/m    Assessment/Interventions: Review of patient past medical history, allergies, medications, health status, including review of consultants reports, laboratory and other test data, was performed as part of comprehensive evaluation and provision of chronic care management services.   SDOH:  (Social Determinants of Health) assessments and interventions performed: Yes  SDOH Screenings   Alcohol Screen: Not on file  Depression (PHQ2-9): Low Risk    PHQ-2 Score: 0  Financial Resource Strain: Not on file  Food Insecurity: Not on file  Housing: Not on file  Physical Activity: Not on file  Social Connections: Not on file  Stress: Not on file  Tobacco Use: High Risk   Smoking Tobacco Use: Every Day   Smokeless Tobacco Use: Never   Passive Exposure: Not on file   Transportation Needs: Not on file    [place]  Allergies  Allergen Reactions   Penicillins Swelling    REACTION: throat swelling Because of a history of documented adverse serious drug reaction;[name] Alert bracelet  is recommended    Medications Reviewed Today     Reviewed by Curt Bears, MD (Physician) on 12/24/20 at [place] List Status: <None>   Medication Order Taking? Sig Documenting Provider Last Dose Status Informant  aspirin EC 81 MG tablet [id number]  Take 81 mg by mouth in the morning. Swallow whole.  Patient not taking: Reported on 12/17/2020   [provider]  Active Self    Discontinued 12/24/20 1155 (Completed Course)   empagliflozin (JARDIANCE) 25 MG TABS  tablet [id number]  Take 1 tablet (25 mg total) by mouth daily. [name], MD  Active Self  glucose blood (FREESTYLE LITE) test strip [id number]  USE TO TEST BLOOD SUGAR TWICE DAILY ICD E11.49  Patient not taking: Reported on 12/17/2020   Hendricks Limes, MD  Active Self  metFORMIN (GLUCOPHAGE) 1000 MG tablet [id number]  TAKE 1 TABLET BY MOUTH TWICE DAILY WITH A MEAL [name], MD  Active Self  metoprolol succinate (TOPROL-XL) 50 MG 24 hr tablet [id number]  TAKE 1 TABLET BY MOUTH EVERY DAY. [name], MD  Active Self  Multiple Vitamin (MULTIVITAMIN WITH MINERALS) TABS tablet [id number]  Take 1 tablet by mouth daily. Centrum Silver [provider]  Active Self  omeprazole (PRILOSEC) 20 MG capsule [id number]  TAKE 1 CAPSULE(20 MG) BY MOUTH TWICE DAILY BEFORE A MEAL [name], MD  Active Self  polyethylene glycol-electrolytes (NULYTELY) 420 g solution [id number]  Take 4,000 mLs by mouth once. [provider]  Active   simvastatin (ZOCOR) 40 MG tablet [id number]  Take 1 tablet (40 mg total) by mouth daily at 6 PM. [name], MD  Active Self  traZODone (DESYREL) 50 MG tablet [id number]  Take 1 tablet (50 mg total) by mouth at bedtime as  needed for sleep.  Patient not taking: Reported on 12/17/2020   Collene Gobble, MD  Active   triamcinolone cream (KENALOG) 0.1 % [id number]  Apply 1 application topically 2 (two) times daily as needed (poison ivy). Collene Gobble, MD  Active             Patient Active Problem List   Diagnosis Date Noted   Adenocarcinoma of right lung, stage 3 ([place]) 12/24/2020   Hemoptysis 12/11/2020   Rash 12/11/2020   Right lower lobe lung mass 12/05/2020   Tobacco use 12/05/2020   Insomnia 10/05/2020   Routine general medical examination at a health care facility 08/13/2016   Neck pain 11/26/2012   GERD (gastroesophageal reflux disease) 09/05/2010   Uncontrolled diabetes mellitus with complications 38/12/1749   Smokers' cough ([place]) 09/25/2008   SKIN CANCER, HX OF 09/25/2008   Essential hypertension 03/20/2008   CAD (coronary artery disease) 03/20/2008   Hyperlipidemia associated with type 2 diabetes mellitus ([place]) 12/23/2007    Immunization History  Administered Date(s) Administered   Influenza, High Dose Seasonal PF 11/18/2017, 11/23/2018   Influenza,inj,Quad PF,6+ Mos 05/11/2015   Influenza-Unspecified 01/02/2013, 01/03/2014, 12/23/2018   PFIZER(Purple Top)SARS-COV-2 Vaccination 06/14/2019, 07/10/2019, 02/21/2020   Pneumococcal Conjugate-13 09/02/2017   Tdap 11/18/2017   Zoster, Live 01/03/2014    Conditions to be addressed/monitored:  Hypertension, Hyperlipidemia, Diabetes, Coronary Artery Disease and Tobacco use  Care Plan : [place]  Updates made by Charlton Haws, [place] since 12/28/2020 12:00 AM     Problem: Hypertension, Hyperlipidemia, Diabetes, Coronary Artery Disease and Tobacco use   Priority: High     Long-Range Goal: Disease management   Start Date: 07/19/2020  Expected End Date: 12/28/2021  This Visit's Progress: On track  Recent Progress: On track  Priority: High  Note:   Current Barriers:  Unable to independently monitor therapeutic  efficacy Unable to achieve control of diabetes, insomnia  Pharmacist Clinical Goal(s):  Patient will achieve adherence to monitoring guidelines and medication adherence to achieve therapeutic efficacy achieve control of diabetes as evidenced by improved A1c < 8% through collaboration with PharmD and provider.   Interventions: 1:1 collaboration with [name], MD regarding development and update  of comprehensive plan of care as evidenced by provider attestation and co-signature Inter-disciplinary care team collaboration (see longitudinal plan of care) Comprehensive medication review performed; medication list updated in electronic medical record  Hypertension    BP goal < 130/80 Patient does not monitor BP at home   Patient has failed these meds in the past: n/a Patient is currently controlled on the following medications:  metoprolol succinate 50 mg daily   We discussed:  BP goals; benefits if medication     Plan: Continue current medications   Hyperlipidemia / CAD    LDL goal < 70 CAD: NSTEMI 2009, medically managed, no stents. Pt declined changing to atorvastatin 09/2020  Patient has failed these meds in past: n/a Patient is currently controlled on the following medications:  simvastatin 40 mg HS aspirin 81 mg daily   We discussed:  LDL is at goal; pt endorses compliance with statin   Plan: Continue current medications   Diabetes    A1c goal < 8% Checking BG: Never Pt declined additional meds 09/2020   Patient has failed these meds in past: n/a Patient is currently uncontrolled on the following medications:  metformin 1000 mg BID Jardiance 25 mg daily   We discussed: A1c improved though still above goal; pt was recently diagnosed with lunc cancer and understandably is foused on treatment for that and not DM; may address at follow up   Plan: Recommend to continue current medication   Insomnia   Patient has failed these meds in past: n/a Patient is  currently uncontrolled on the following medications:  Trazodone 50 mg HS prn  We discussed:  Pt reports he goes to bed ~9pm, wakes up every night at 1-2 am, falls back asleep around 4-5 am and sleeps for a few hours; he reports no issues falling asleep; trazodone 50 mg helped him sleep another ~half hour before waking up in middle of the nigh  Plan: Increase trazodone to 100 mg (2 tablets) at bedtime; if no improvement after a week try adding melatonin 5 mg HS  Tobacco Use    Currently smoking 2 ppd. First cigarette within 30 min of waking. Previous quit attempts: Quit for 8-9 months about 20 yrs ago, cold [place]   We discussed:  smoking cessation in light of recent lung cancer diagnosis; pt wants to try nicotine patches; counseled on apply patches in AM and removing before bedtime; gave number for [place] Quitline to obtain free patches   Plan: Start Nicotine patches 21 mg/hr - 1 patch daily    Patient Goals/Self-Care Activities Patient will:  - take medications as prescribed -focus on medication adherence by routine -target a minimum of 150 minutes of moderate intensity exercise weekly -engage in dietary modifications by reducing sugar, carbs      Compliance/Adherence/Medication fill history: Care Gaps: Colonoscopy (due 2/722) - scheduled for 12/31/20  Star-Rating Drugs: Jardiance - LF 11/11/20 x 90 ds Simvastatin - LF 12/23/20 x 90 ds Metformin - LF 12/24/20 x 90 ds  Medication Assistance:  Consider Jardiance PAP  Patient's preferred pharmacy is:  [id number] [place], [place] - [address] AT [facility] [address] [place] [address] Phone: [phone] Fax: [phone]  Uses pill box? Yes Pt endorses 100% compliance  We discussed: Current pharmacy is preferred with insurance plan and patient is satisfied with pharmacy services Patient decided to: Continue current medication management strategy  Care Plan and Follow Up  Patient Decision:  Patient agrees to Care Plan and Follow-up.  Plan: Telephone follow up appointment with care management team member scheduled for:  3 months  Charlene Brooke, PharmD, [place], CPP Clinical Pharmacist [facility] at [facility] [phone]

## 2020-12-28 ENCOUNTER — Telehealth: Payer: Self-pay | Admitting: Emergency Medicine

## 2020-12-28 MED ORDER — NICOTINE 21 MG/24HR TD PT24: 21.0000 mg | MEDICATED_PATCH | Freq: Every day | TRANSDERMAL | 0 refills | Status: DC

## 2020-12-28 MED ORDER — TRAZODONE HCL 50 MG PO TABS: 50.0000 mg | ORAL_TABLET | Freq: Every evening | ORAL | 0 refills | Status: DC | PRN

## 2020-12-28 NOTE — Patient Instructions (Signed)
Visit Information  Phone number for Pharmacist: [phone]   Goals Addressed             This Visit's Progress    Stop or Cut Down Tobacco Use       Timeframe:  Long-Range Goal Priority:  High Start Date:     12/28/20                        Expected End Date:    12/28/21                   Follow Up Date Jan 2023   - change or avoid triggers like smoky places, drinking alcohol and other smokers - cut down number of cigarettes by one-half - use over-the-counter gum, patch or lozenges - use Quit Line 1-800-Quit Now    Why is this important?   To stop or cut down it is important to have support from a person or group of people who you can count on.  You will also need to think about the things that make you feel like smoking, then plan for how to handle them.    Notes:         Care Plan : CCM Pharmacy Care Plan  Updates made by Charlton Haws, RPH since 12/28/2020 12:00 AM     Problem: Hypertension, Hyperlipidemia, Diabetes, Coronary Artery Disease and Tobacco use   Priority: High     Long-Range Goal: Disease management   Start Date: 07/19/2020  Expected End Date: 12/28/2021  This Visit's Progress: On track  Recent Progress: On track  Priority: High  Note:   Current Barriers:  Unable to independently monitor therapeutic efficacy Unable to achieve control of diabetes, insomnia  Pharmacist Clinical Goal(s):  Patient will achieve adherence to monitoring guidelines and medication adherence to achieve therapeutic efficacy achieve control of diabetes as evidenced by improved A1c < 8% through collaboration with PharmD and provider.   Interventions: 1:1 collaboration with [name], MD regarding development and update of comprehensive plan of care as evidenced by provider attestation and co-signature Inter-disciplinary care team collaboration (see longitudinal plan of care) Comprehensive medication review performed; medication list updated in  electronic medical record  Hypertension    BP goal < 130/80 Patient does not monitor BP at home   Patient has failed these meds in the past: n/a Patient is currently controlled on the following medications:  metoprolol succinate 50 mg daily   We discussed:  BP goals; benefits if medication     Plan: Continue current medications   Hyperlipidemia / CAD    LDL goal < 70 CAD: NSTEMI 2009, medically managed, no stents. Pt declined changing to atorvastatin 09/2020  Patient has failed these meds in past: n/a Patient is currently controlled on the following medications:  simvastatin 40 mg HS aspirin 81 mg daily   We discussed:  LDL is at goal; pt endorses compliance with statin   Plan: Continue current medications   Diabetes    A1c goal < 8% Checking BG: Never Pt declined additional meds 09/2020   Patient has failed these meds in past: n/a Patient is currently uncontrolled on the following medications:  metformin 1000 mg BID Jardiance 25 mg daily   We discussed: A1c improved though still above goal; pt was recently diagnosed with lunc cancer and understandably is foused on treatment for that and not DM; may address at follow up   Plan: Recommend to continue current medication  Insomnia   Patient has failed these meds in past: n/a Patient is currently uncontrolled on the following medications:  Trazodone 50 mg HS prn  We discussed:  Pt reports he goes to bed ~9pm, wakes up every night at 1-2 am, falls back asleep around 4-5 am and sleeps for a few hours; he reports no issues falling asleep; trazodone 50 mg helped him sleep another ~half hour before waking up in middle of the nigh  Plan: Increase trazodone to 100 mg (2 tablets) at bedtime; if no improvement after a week try adding melatonin 5 mg HS  Tobacco Use    Currently smoking 2 ppd. First cigarette within 30 min of waking. Previous quit attempts: Quit for 8-9 months about 20 yrs ago, cold [place]   We discussed:   smoking cessation in light of recent lung cancer diagnosis; pt wants to try nicotine patches; counseled on apply patches in AM and removing before bedtime; gave number for [place] Quitline to obtain free patches   Plan: Start Nicotine patches 21 mg/hr - 1 patch daily    Patient Goals/Self-Care Activities Patient will:  - take medications as prescribed -focus on medication adherence by routine -target a minimum of 150 minutes of moderate intensity exercise weekly -engage in dietary modifications by reducing sugar, carbs      The patient verbalized understanding of instructions, educational materials, and care plan provided today and declined offer to receive copy of patient instructions, educational materials, and care plan.  Telephone follow up appointment with pharmacy team member scheduled for: 3 months  Charlene Brooke, PharmD, [place], CPP Clinical Pharmacist [facility] at [facility] [phone]

## 2020-12-28 NOTE — Telephone Encounter (Signed)
Called and spoke with patient, advised  I would send a message to Dr. Lamonte Sakai to get clarification on whether he needs to keep his November appointment with Dr. Lamonte Sakai since he will not have seen Dr. Roxan Hockey prior to that appointment.  I let him know I would send a message to Dr. Lamonte Sakai and once we hear back from him we will call him back.  He verbalized understanding.    Dr. Lamonte Sakai, Do you want to see this patient back in November?  He will not have seen Dr. Roxan Hockey prior to that appointment.  Please advise.  Thank you.

## 2020-12-31 ENCOUNTER — Encounter: Payer: Self-pay | Admitting: Gastroenterology

## 2020-12-31 ENCOUNTER — Ambulatory Visit (AMBULATORY_SURGERY_CENTER): Payer: Medicare Other | Admitting: Gastroenterology

## 2020-12-31 VITALS — BP 128/72 | HR 78 | Temp 98.0°F | Resp 19 | Ht 72.0 in | Wt 219.0 lb

## 2020-12-31 DIAGNOSIS — Z86010 Personal history of colonic polyps: Secondary | ICD-10-CM | POA: Diagnosis not present

## 2020-12-31 DIAGNOSIS — D123 Benign neoplasm of transverse colon: Secondary | ICD-10-CM

## 2020-12-31 DIAGNOSIS — I252 Old myocardial infarction: Secondary | ICD-10-CM | POA: Diagnosis not present

## 2020-12-31 DIAGNOSIS — I2510 Atherosclerotic heart disease of native coronary artery without angina pectoris: Secondary | ICD-10-CM | POA: Diagnosis not present

## 2020-12-31 DIAGNOSIS — Z800 Family history of malignant neoplasm of digestive organs: Secondary | ICD-10-CM

## 2020-12-31 DIAGNOSIS — I10 Essential (primary) hypertension: Secondary | ICD-10-CM | POA: Diagnosis not present

## 2020-12-31 MED ORDER — SODIUM CHLORIDE 0.9 % IV SOLN: 500.0000 mL | INTRAVENOUS | Status: DC

## 2020-12-31 NOTE — Telephone Encounter (Signed)
I called and left a message for the patient to call back and I gave him the recommendation per Dr. Lamonte Sakai to postpone the OV for this time  and he was asked to call back to confirm the patient is cancelling the OV.

## 2020-12-31 NOTE — Op Note (Signed)
[facility] Patient Name: Brandon Rubio Procedure Date: 12/31/2020 11:07 AM MRN: [id number] Endoscopist: Milus Banister , MD Age: 70 Referring MD:  Date of Birth: 07/01/1950 Gender: Male Account #: [id number] Procedure:                Colonoscopy Indications:              High risk colon cancer surveillance: Personal                            history of colonic polyps; Colonoscopy 2017 single                            subCM adenoma removed Medicines:                Monitored Anesthesia Care Procedure:                Pre-Anesthesia Assessment:                           - Prior to the procedure, a History and Physical                            was performed, and patient medications and                            allergies were reviewed. The patient's tolerance of                            previous anesthesia was also reviewed. The risks                            and benefits of the procedure and the sedation                            options and risks were discussed with the patient.                            All questions were answered, and informed consent                            was obtained. Prior Anticoagulants: The patient has                            taken no previous anticoagulant or antiplatelet                            agents. ASA Grade Assessment: II - A patient with                            mild systemic disease. After reviewing the risks                            and benefits, the patient was deemed in  satisfactory condition to undergo the procedure.                           After obtaining informed consent, the colonoscope                            was passed under direct vision. Throughout the                            procedure, the patient's blood pressure, pulse, and                            oxygen saturations were monitored continuously. The                            Olympus CF-HQ190L [id number])  Colonoscope was                            introduced through the anus and advanced to the the                            cecum, identified by appendiceal orifice and                            ileocecal valve. The colonoscopy was performed                            without difficulty. The patient tolerated the                            procedure well. The quality of the bowel                            preparation was good. Scope In: 11:14:47 AM Scope Out: 11:27:15 AM Scope Withdrawal Time: 0 hours 8 minutes 39 seconds  Total Procedure Duration: 0 hours 12 minutes 28 seconds  Findings:                 Two sessile polyps were found in the transverse                            colon. The polyps were 2 to 4 mm in size. These                            polyps were removed with a cold snare. Resection                            and retrieval were complete.                           Internal hemorrhoids were found. The hemorrhoids                            were small.  The exam was otherwise without abnormality on                            direct and retroflexion views. Complications:            No immediate complications. Estimated blood loss:                            None. Estimated Blood Loss:     Estimated blood loss: none. Impression:               - Two 2 to 4 mm polyps in the transverse colon,                            removed with a cold snare. Resected and retrieved.                           - Internal hemorrhoids.                           - The examination was otherwise normal on direct                            and retroflexion views. Recommendation:           - Patient has a contact number available for                            emergencies. The signs and symptoms of potential                            delayed complications were discussed with the                            patient. Return to normal activities tomorrow.                             Written discharge instructions were provided to the                            patient.                           - Resume previous diet.                           - Continue present medications.                           - Await pathology results. Milus Banister, MD 12/31/2020 11:30:49 AM This report has been signed electronically.

## 2020-12-31 NOTE — Patient Instructions (Signed)
Resume previous diet and continue current medications. Awaiting pathology results. Repeat colonoscopy date to be determined based on pathology results.  YOU HAD AN ENDOSCOPIC PROCEDURE TODAY AT [facility]:   Refer to the procedure report that was given to you for any specific questions about what was found during the examination.  If the procedure report does not answer your questions, please call your gastroenterologist to clarify.  If you requested that your care partner not be given the details of your procedure findings, then the procedure report has been included in a sealed envelope for you to review at your convenience later.  YOU SHOULD EXPECT: Some feelings of bloating in the abdomen. Passage of more gas than usual.  Walking can help get rid of the air that was put into your GI tract during the procedure and reduce the bloating. If you had a lower endoscopy (such as a colonoscopy or flexible sigmoidoscopy) you may notice spotting of blood in your stool or on the toilet paper. If you underwent a bowel prep for your procedure, you may not have a normal bowel movement for a few days.  Please Note:  You might notice some irritation and congestion in your nose or some drainage.  This is from the oxygen used during your procedure.  There is no need for concern and it should clear up in a day or so.  SYMPTOMS TO REPORT IMMEDIATELY:  Following lower endoscopy (colonoscopy or flexible sigmoidoscopy):  Excessive amounts of blood in the stool  Significant tenderness or worsening of abdominal pains  Swelling of the abdomen that is new, acute  Fever of 100F or higher  For urgent or emergent issues, a gastroenterologist can be reached at any hour by calling [phone]. Do not use MyChart messaging for urgent concerns.    DIET:  We do recommend a small meal at first, but then you may proceed to your regular diet.  Drink plenty of fluids but you should avoid alcoholic beverages  for 24 hours.  ACTIVITY:  You should plan to take it easy for the rest of today and you should NOT DRIVE or use heavy machinery until tomorrow (because of the sedation medicines used during the test).    FOLLOW UP: Our staff will call the number listed on your records 48-72 hours following your procedure to check on you and address any questions or concerns that you may have regarding the information given to you following your procedure. If we do not reach you, we will leave a message.  We will attempt to reach you two times.  During this call, we will ask if you have developed any symptoms of COVID 19. If you develop any symptoms (ie: fever, flu-like symptoms, shortness of breath, cough etc.) before then, please call [phone].  If you test positive for Covid 19 in the 2 weeks post procedure, please call and report this information to [place].    If any biopsies were taken you will be contacted by phone or by letter within the next 1-3 weeks.  Please call us at [phone] if you have not heard about the biopsies in 3 weeks.    SIGNATURES/CONFIDENTIALITY: You and/or your care partner have signed paperwork which will be entered into your electronic medical record.  These signatures attest to the fact that that the information above on your After Visit Summary has been reviewed and is understood.  Full responsibility of the confidentiality of this discharge information lies with you and/or your  care-partner.

## 2020-12-31 NOTE — Progress Notes (Signed)
HPI: This is a man with h/o polyps   ROS: complete GI ROS as described in HPI, all other review negative.  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   Peripheral neuropathy    toes    Past Surgical History:  Procedure Laterality Date   BRONCHIAL BIOPSY  12/10/2020   Procedure: BRONCHIAL BIOPSIES;  Surgeon: Collene Gobble, MD;  Location: [facility] ENDOSCOPY;  Service: Pulmonary;;   BRONCHIAL BRUSHINGS  12/10/2020   Procedure: BRONCHIAL BRUSHINGS;  Surgeon: Collene Gobble, MD;  Location: [facility] ENDOSCOPY;  Service: Pulmonary;;   BRONCHIAL NEEDLE ASPIRATION BIOPSY  12/10/2020   Procedure: BRONCHIAL NEEDLE ASPIRATION BIOPSIES;  Surgeon: Collene Gobble, MD;  Location: [facility] ENDOSCOPY;  Service: Pulmonary;;   COLONOSCOPY     COLONOSCOPY W/ POLYPECTOMY  03/10/2009   LARYNGOSCOPY  03/10/1997   VIDEO BRONCHOSCOPY WITH ENDOBRONCHIAL NAVIGATION N/A 12/10/2020   Procedure: ROBOTIC [place];  Surgeon: Collene Gobble, MD;  Location: [facility];  Service: Pulmonary;  Laterality: N/A;    Current Outpatient Medications  Medication Sig Dispense Refill   aspirin EC 81 MG tablet Take 81 mg by mouth in the morning. Swallow whole.     empagliflozin (JARDIANCE) 25 MG TABS tablet Take 1 tablet (25 mg total) by mouth daily. 90 tablet 3   metFORMIN (GLUCOPHAGE) 1000 MG tablet TAKE 1 TABLET BY MOUTH TWICE DAILY WITH A MEAL 180 tablet 1   metoprolol succinate (TOPROL-XL) 50 MG 24 hr tablet TAKE 1 TABLET BY MOUTH EVERY DAY. 90 tablet 0   Multiple Vitamin (MULTIVITAMIN WITH MINERALS) TABS tablet Take  1 tablet by mouth daily. Centrum Silver     omeprazole (PRILOSEC) 20 MG capsule TAKE 1 CAPSULE(20 MG) BY MOUTH TWICE DAILY BEFORE A MEAL 180 capsule 3   simvastatin (ZOCOR) 40 MG tablet Take 1 tablet (40 mg total) by mouth daily at 6 PM. 90 tablet 3   traZODone (DESYREL) 50 MG tablet Take 1-2 tablets (50-100 mg total) by mouth at bedtime as needed for sleep. 180 tablet 0   glucose blood (FREESTYLE LITE) test strip USE TO TEST BLOOD SUGAR TWICE DAILY ICD E11.49 100 each 3   nicotine (NICODERM CQ - DOSED IN MG/24 HOURS) 21 mg/24hr patch Place 1 patch (21 mg total) onto the skin daily. 28 patch 0   triamcinolone cream (KENALOG) 0.1 % Apply 1 application topically 2 (two) times daily as needed (poison ivy).     Current Facility-Administered Medications  Medication Dose Route Frequency Provider Last Rate Last Admin   0.9 %  sodium chloride infusion  500 mL Intravenous Continuous Milus Banister, MD        Allergies as of 12/31/2020 - Review Complete 12/31/2020  Allergen Reaction Noted   Penicillins Swelling     Family History  Problem Relation Age of Onset   Lung cancer Mother        X 2   Colon polyps Father    Colon cancer Father 58   Coronary artery disease Father    Lung disease  Father    Esophageal cancer Neg Hx    Stomach cancer Neg Hx    Rectal cancer Neg Hx     Social History   Socioeconomic History   Marital status: Divorced    Spouse name: Not on file   Number of children: Not on file   Years of education: Not on file   Highest education level: Not on file  Occupational History   Not on file  Tobacco Use   Smoking status: Every Day    Packs/day: 2.00    Years: 58.00    Pack years: 116.00    Types: Cigarettes   Smokeless tobacco: Never   Tobacco comments:    2+ packs smoked daily ARJ 12/05/20  Vaping Use   Vaping Use: Never used  Substance and Sexual Activity   Alcohol use: No    Alcohol/week: 0.0 standard drinks    Comment:  none since 04/2012   Drug use:  Never   Sexual activity: Not on file  Other Topics Concern   Not on file  Social History Narrative   REG EXERCISE         Social Determinants of Health   Financial Resource Strain: Not on file  Food Insecurity: Not on file  Transportation Needs: Not on file  Physical Activity: Not on file  Stress: Not on file  Social Connections: Not on file  Intimate Partner Violence: Not on file     Physical Exam: BP 112/62   Pulse 90   Temp 98 F (36.7 C)   Resp 19   Ht 6' (1.829 m)   Wt 219 lb (99.3 kg)   SpO2 94%   BMI 29.70 kg/m  Constitutional: generally well-appearing Psychiatric: alert and oriented x3 Lungs: CTA bilaterally Heart: no MCR  Assessment and plan: 70 y.o. male with h/o polyps  Colonsocopy today  Care is appropriate for the ambulatory setting.  Owens Loffler, MD [facility] 12/31/2020, 11:31 AM

## 2020-12-31 NOTE — Telephone Encounter (Signed)
I think it would be ok for [place] to postpone his office visit for a few months - that will give him the chance to see Dr Roxan Hockey first

## 2020-12-31 NOTE — Progress Notes (Signed)
Vs by jd; no changes to health hx since previsit

## 2020-12-31 NOTE — Progress Notes (Signed)
To PACU, VSS. Report to [email or web address] 

## 2020-12-31 NOTE — Progress Notes (Signed)
Called to room to assist during endoscopic procedure.  Patient ID and intended procedure confirmed with present staff. Received instructions for my participation in the procedure from the performing physician.  

## 2020-12-31 NOTE — Telephone Encounter (Signed)
Patient has called back and cancelled his OV with provider. Nothing further needed.

## 2021-01-01 ENCOUNTER — Ambulatory Visit (HOSPITAL_COMMUNITY)
Admission: RE | Admit: 2021-01-01 | Discharge: 2021-01-01 | Disposition: A | Discharge status: Home / Self Care | Payer: Medicare Other | Source: Ambulatory Visit | Attending: Internal Medicine | Admitting: Internal Medicine

## 2021-01-01 ENCOUNTER — Other Ambulatory Visit (HOSPITAL_COMMUNITY): Payer: Self-pay | Admitting: Internal Medicine

## 2021-01-01 ENCOUNTER — Other Ambulatory Visit: Payer: Self-pay

## 2021-01-01 DIAGNOSIS — C3490 Malignant neoplasm of unspecified part of unspecified bronchus or lung: Secondary | ICD-10-CM

## 2021-01-01 DIAGNOSIS — Z01818 Encounter for other preprocedural examination: Secondary | ICD-10-CM | POA: Diagnosis not present

## 2021-01-01 IMAGING — MR MR HEAD WO/W CM
13 series · 48 of 48 positions shown · IV contrast (gadavist)
Comparison: None.

CLINICAL DATA: Non-small cell lung cancer, staging

EXAM:
MRI HEAD WITHOUT AND WITH CONTRAST
TECHNIQUE: Multiplanar, multiecho pulse sequences of the brain and surrounding
structures were obtained without and with intravenous contrast.
CONTRAST:  10mL GADAVIST GADOBUTROL 1 MMOL/ML IV SOLN

[Series 5: DWI · axial · 3.0mm · 1.36mm/px · z∈[-70,+84]mm · 7 of 108 slices shown (1 of 2)]
[im 1/108]
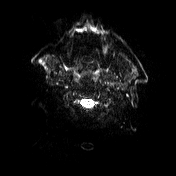
[im 18/108]
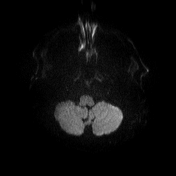
[im 36/108]
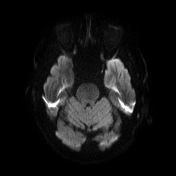
[im 54/108]
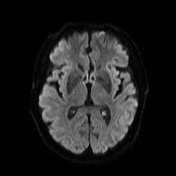
[im 72/108]
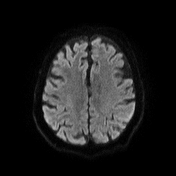
[im 90/108]
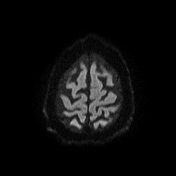
[im 108/108]
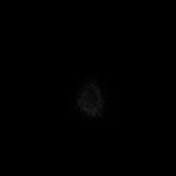

[Series 6: DWI · axial · 3.0mm · 1.36mm/px · z∈[-70,+84]mm · 4 of 54 slices shown (2 of 2)]
[im 1/54]
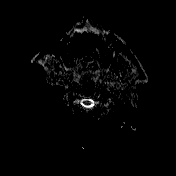
[im 18/54]
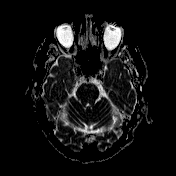
[im 36/54]
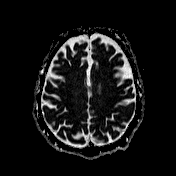
[im 54/54]
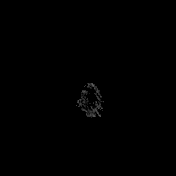

[Series 7: T1 · sagittal · 5.0mm · 0.75mm/px · 1 of 24 slices shown (1 of 2)]
[im 1/24]
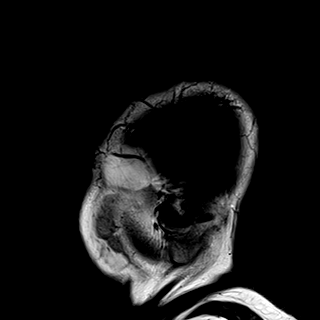

[Series 8: T2 · axial · 5.0mm · 0.62mm/px · z∈[-65,+91]mm · 2 of 26 slices shown]
[im 1/26]
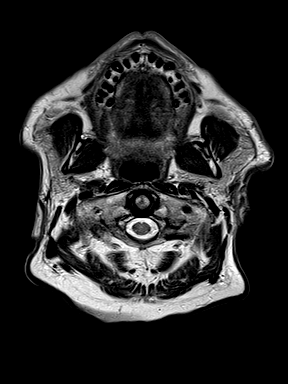
[im 26/26]
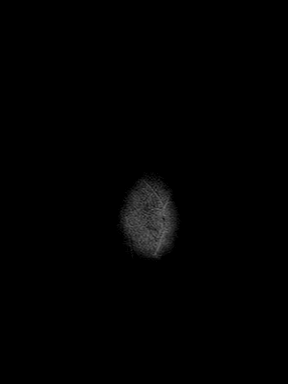

[Series 9: swi_images · axial · 3.0mm · 0.75mm/px · z∈[-67,+91]mm · 3 of 56 slices shown]
[im 1/56]
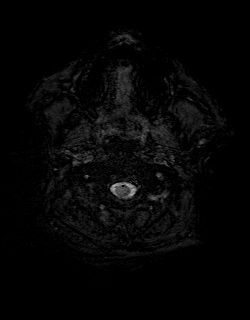
[im 28/56]
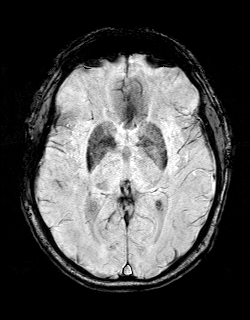
[im 56/56]
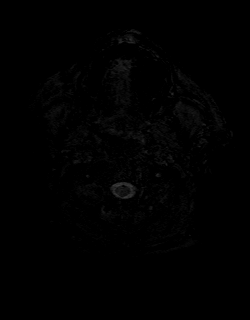

[Series 11: FLAIR · axial · 3.0mm · 0.75mm/px · z∈[-68,+91]mm · 3 of 56 slices shown]
[im 1/56]
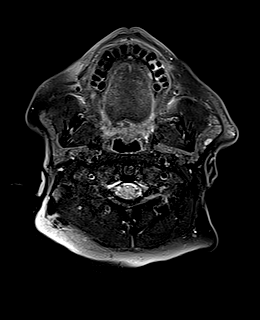
[im 28/56]
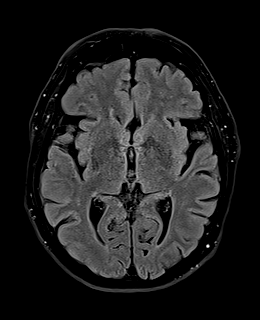
[im 56/56]
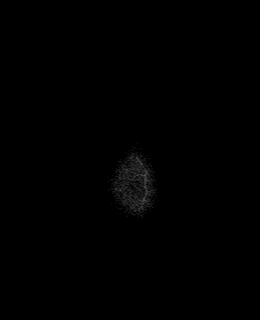

[Series 12: T1 · axial · 1.0mm · 0.94mm/px · z∈[-58,+95]mm · 9 of 160 slices shown (2 of 2)]
[im 1/160]
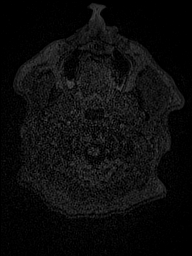
[im 20/160]
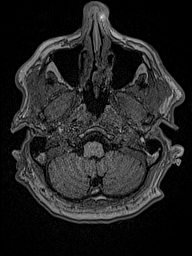
[im 40/160]
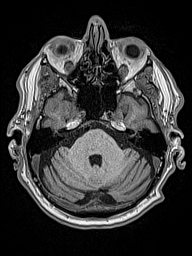
[im 60/160]
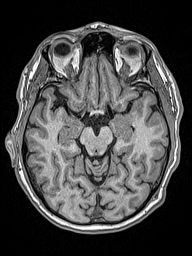
[im 80/160]
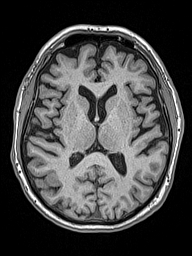
[im 100/160]
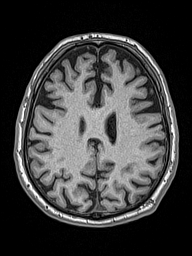
[im 120/160]
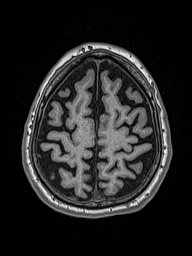
[im 140/160]
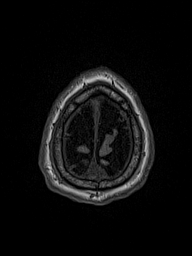
[im 160/160]
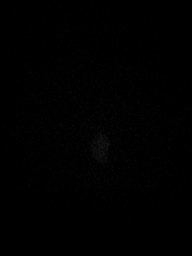

[Series 13: cor dwi_tracew · coronal · 5.0mm · 1.53mm/px · 3 of 56 slices shown]
[im 1/56]
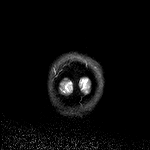
[im 28/56]
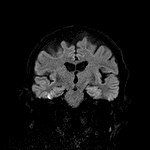
[im 56/56]
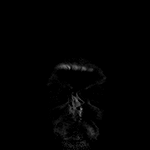

[Series 14: cor dwi_adc · coronal · 5.0mm · 1.53mm/px · 2 of 28 slices shown]
[im 1/28]
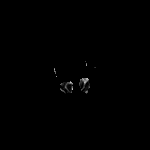
[im 28/28]
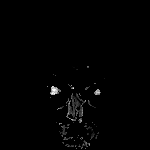

[Series 15: T2 post-contrast · coronal · 5.0mm · 0.57mm/px · 2 of 29 slices shown]
[im 1/29]
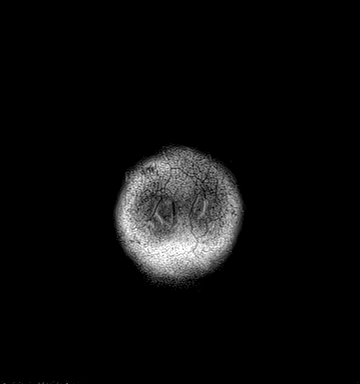
[im 29/29]
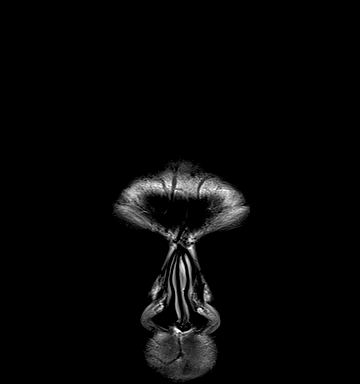

[Series 16: T1 post-contrast · axial · 1.0mm · 0.94mm/px · z∈[-58,+95]mm · 9 of 160 slices shown (1 of 3)]
[im 1/160]
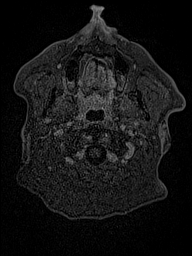
[im 20/160]
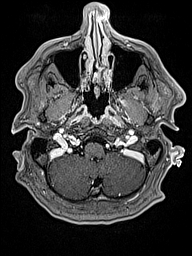
[im 40/160]
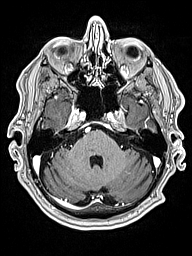
[im 60/160]
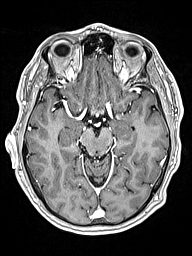
[im 80/160]
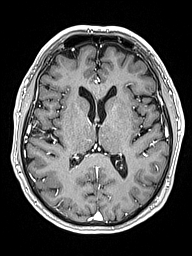
[im 100/160]
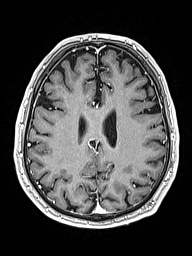
[im 120/160]
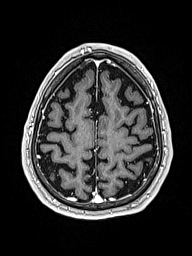
[im 140/160]
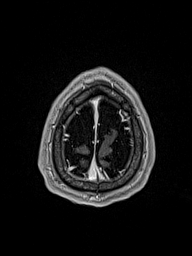
[im 160/160]
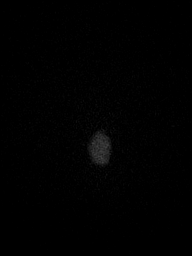

[Series 17: T1 post-contrast · coronal · 5.0mm · 0.43mm/px · 2 of 29 slices shown (2 of 3)]
[im 1/29]
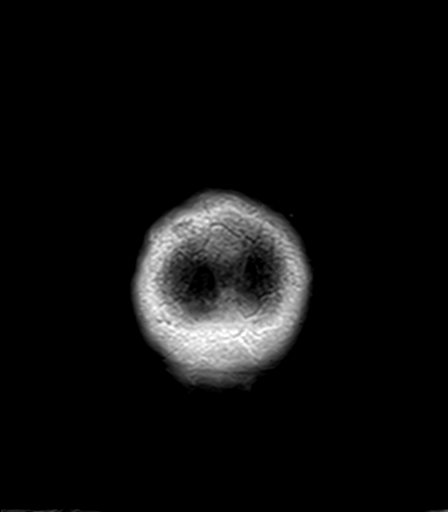
[im 29/29]
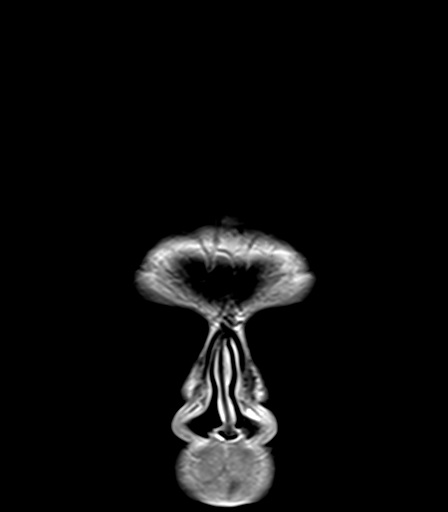

[Series 18: T1 post-contrast · sagittal · 5.0mm · 0.75mm/px · 1 of 24 slices shown (3 of 3)]
[im 1/24]
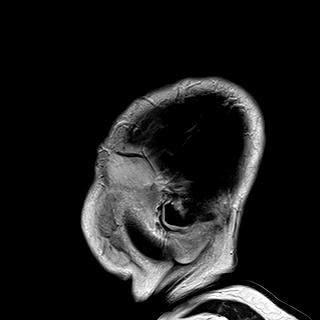

[48 of 48 positions shown; findings below may reference images not displayed]

FINDINGS: Brain: There is no acute infarction or intracranial hemorrhage.
There is no intracranial mass, mass effect, or edema. There is no
hydrocephalus or extra-axial fluid collection. Prominence of the
ventricles and sulci reflects minor parenchymal volume loss. Minimal
punctate foci of T2 hyperintensity in the supratentorial white
matter may reflect minor chronic microvascular ischemic changes or
gliosis/demyelination of other etiologies. No abnormal enhancement.

Vascular: Major vessel flow voids at the skull base are preserved.

Skull and upper cervical spine: Normal marrow signal is preserved.

Sinuses/Orbits: Paranasal sinuses are aerated. Orbits are
unremarkable.

Other: Sella is unremarkable.  Mastoid air cells are clear.

## 2021-01-01 MED ORDER — GADOBUTROL 1 MMOL/ML IV SOLN
10.0000 mL | Freq: Once | INTRAVENOUS | Status: AC | PRN
  Administered 2021-01-01: 10 mL via INTRAVENOUS

## 2021-01-02 ENCOUNTER — Telehealth: Payer: Self-pay

## 2021-01-02 NOTE — Telephone Encounter (Signed)
  Follow up Call-  Call back number 12/31/2020  Post procedure Call Back phone  # [phone]  Permission to leave phone message Yes  Some recent data might be hidden     Patient questions:  Do you have a fever, pain , or abdominal swelling? No. Pain Score  0 *  Have you tolerated food without any problems? Yes.    Have you been able to return to your normal activities? Yes.    Do you have any questions about your discharge instructions: Diet   No. Medications  No. Follow up visit  No.  Do you have questions or concerns about your Care? No.  Actions: * If pain score is 4 or above: No action needed, pain <4. Have you developed a fever since your procedure? no  2.   Have you had an respiratory symptoms (SOB or cough) since your procedure? no  3.   Have you tested positive for COVID 19 since your procedure no  4.   Have you had any family members/close contacts diagnosed with the COVID 19 since your procedure?  no   If yes to any of these questions please route to Joylene John, RN and Joella Prince, RN

## 2021-01-04 ENCOUNTER — Encounter: Payer: Self-pay | Admitting: Gastroenterology

## 2021-01-07 ENCOUNTER — Encounter: Payer: Medicare Other | Admitting: Thoracic Surgery (Cardiothoracic Vascular Surgery)

## 2021-01-07 ENCOUNTER — Other Ambulatory Visit: Payer: Self-pay

## 2021-01-07 DIAGNOSIS — E785 Hyperlipidemia, unspecified: Secondary | ICD-10-CM

## 2021-01-07 DIAGNOSIS — I2510 Atherosclerotic heart disease of native coronary artery without angina pectoris: Secondary | ICD-10-CM

## 2021-01-07 DIAGNOSIS — I10 Essential (primary) hypertension: Secondary | ICD-10-CM | POA: Diagnosis not present

## 2021-01-07 DIAGNOSIS — E1165 Type 2 diabetes mellitus with hyperglycemia: Secondary | ICD-10-CM

## 2021-01-07 DIAGNOSIS — E1169 Type 2 diabetes mellitus with other specified complication: Secondary | ICD-10-CM | POA: Diagnosis not present

## 2021-01-09 ENCOUNTER — Ambulatory Visit: Payer: Medicare Other | Admitting: Emergency Medicine

## 2021-01-14 ENCOUNTER — Other Ambulatory Visit: Payer: Self-pay

## 2021-01-14 ENCOUNTER — Encounter: Payer: Self-pay | Admitting: Thoracic Surgery (Cardiothoracic Vascular Surgery)

## 2021-01-14 ENCOUNTER — Institutional Professional Consult (permissible substitution) (INDEPENDENT_AMBULATORY_CARE_PROVIDER_SITE_OTHER): Payer: Medicare Other | Admitting: Thoracic Surgery (Cardiothoracic Vascular Surgery)

## 2021-01-14 VITALS — BP 135/76 | HR 78 | Ht 72.0 in | Wt 216.6 lb

## 2021-01-14 DIAGNOSIS — I2510 Atherosclerotic heart disease of native coronary artery without angina pectoris: Secondary | ICD-10-CM

## 2021-01-14 DIAGNOSIS — R918 Other nonspecific abnormal finding of lung field: Secondary | ICD-10-CM | POA: Diagnosis not present

## 2021-01-14 NOTE — Progress Notes (Signed)
PCP is Brandon Koch, MD Referring Provider is Brandon Bears, MD  Chief Complaint  Patient presents with   Lung Lesion    Initial surgical consult, CT Super D 9/29, PET 9/21, PFT 9/8    HPI: [name] is sent for consultation regarding a right lower lobe lung cancer.  Brandon Rubio, Brandon Rubio. is a 70 year old man with a past medical history significant for CAD, MI in 2003, hypertension, hyperlipidemia, type 2 diabetes, peripheral neuropathy, DVT (after ankle fracture), reflux, and tobacco abuse.    He recently had a low-dose CT for screening for lung cancer.  It showed a 7 cm right lower lobe lung mass with a 5.7 cm solid component.  He was referred to Dr. Lamonte Rubio we did a robotic assisted navigational bronchoscopy.  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  He saw Dr. Julien Rubio.  A PET/CT showed the mass was hypermetabolic.  There also was some interstitial accumulation in the right upper lobe.  He has 116 pack-year history of smoking.  He continues to smoke 2 packs daily.  He has cough.  He is not had any chest pain, pressure, tightness, or shortness of breath.  He does have bilateral neck pain which has been present for years.  It is not exertional.  He has had extensive work-up for that over the years.  He had a brother who died of throat cancer.  He is retired but remains fairly active.  Zubrod Score: At the time of surgery this patient's most appropriate activity status/level should be described as: [x]     0    Normal activity, no symptoms []     1    Restricted in physical strenuous activity but ambulatory, able to do out light work []     2    Ambulatory and capable of self care, unable to do work activities, up and about >50 % of waking hours                              []     3    Only limited self care, in bed greater than 50% of waking hours []     4    Completely disabled, no self care, confined to bed or chair []     5     Moribund  Past Medical History:  Diagnosis Date   CAD (coronary artery disease)    Cancer ([place])    SKIN.[name]   Cataract    Diabetes mellitus    2   DVT (deep venous thrombosis) ([facility]) 03/30/2001   RLE DVT, post ankle fracture   GERD (gastroesophageal reflux disease)    Hyperlipidemia    Hypertension    Lung cancer ([place]) 12/10/2020   Myocardial infarct, 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   Peripheral neuropathy    toes    Past Surgical History:  Procedure Laterality Date   BRONCHIAL BIOPSY  12/10/2020   Procedure: BRONCHIAL BIOPSIES;  Surgeon: Brandon Gobble, MD;  Location: [facility] ENDOSCOPY;  Service: Pulmonary;;   BRONCHIAL BRUSHINGS  12/10/2020   Procedure: BRONCHIAL BRUSHINGS;  Surgeon: Brandon Gobble, MD;  Location: [place];  Service: Pulmonary;;   BRONCHIAL NEEDLE ASPIRATION BIOPSY  12/10/2020   Procedure: BRONCHIAL NEEDLE ASPIRATION BIOPSIES;  Surgeon: Brandon Gobble, MD;  Location: [facility] ENDOSCOPY;  Service: Pulmonary;;   COLONOSCOPY     COLONOSCOPY W/ POLYPECTOMY  03/10/2009   LARYNGOSCOPY  03/10/1997   VIDEO BRONCHOSCOPY WITH ENDOBRONCHIAL NAVIGATION N/A  12/10/2020   Procedure: ROBOTIC VIDEO BRONCHOSCOPY WITH ENDOBRONCHIAL NAVIGATION;  Surgeon: Brandon Gobble, MD;  Location: [facility] ENDOSCOPY;  Service: Pulmonary;  Laterality: N/A;    Family History  Problem Relation Age of Onset   Lung cancer Mother        X 2   Colon polyps Father    Colon cancer Father 27   Coronary artery disease Father    Lung disease Father    Esophageal cancer Neg Hx    Stomach cancer Neg Hx    Rectal cancer Neg Hx     Social History Social History   Tobacco Use   Smoking status: Every Day    Packs/day: 2.00    Years: 58.00    Pack years: 116.00    Types: Cigarettes   Smokeless tobacco: Never   Tobacco comments:    2+ packs smoked daily ARJ 12/05/20  Vaping Use   Vaping Use: Never used  Substance Use Topics   Alcohol use: No    Alcohol/week: 0.0 standard drinks    Comment:  none since  04/2012   Drug use: Never    Current Outpatient Medications  Medication Sig Dispense Refill   aspirin EC 81 MG tablet Take 81 mg by mouth in the morning. Swallow whole.     empagliflozin (JARDIANCE) 25 MG TABS tablet Take 1 tablet (25 mg total) by mouth daily. 90 tablet 3   glucose blood (FREESTYLE LITE) test strip USE TO TEST BLOOD SUGAR TWICE DAILY ICD E11.49 100 each 3   Melatonin 5 MG CHEW Chew 5 mg by mouth at bedtime.     metFORMIN (GLUCOPHAGE) 1000 MG tablet TAKE 1 TABLET BY MOUTH TWICE DAILY WITH A MEAL 180 tablet 1   metoprolol succinate (TOPROL-XL) 50 MG 24 hr tablet TAKE 1 TABLET BY MOUTH EVERY DAY. 90 tablet 0   Multiple Vitamin (MULTIVITAMIN WITH MINERALS) TABS tablet Take 1 tablet by mouth daily. Centrum Silver     nicotine (NICODERM CQ - DOSED IN MG/24 HOURS) 21 mg/24hr patch Place 1 patch (21 mg total) onto the skin daily. 28 patch 0   omeprazole (PRILOSEC) 20 MG capsule TAKE 1 CAPSULE(20 MG) BY MOUTH TWICE DAILY BEFORE A MEAL 180 capsule 3   simvastatin (ZOCOR) 40 MG tablet Take 1 tablet (40 mg total) by mouth daily at 6 PM. 90 tablet 3   traZODone (DESYREL) 50 MG tablet Take 1-2 tablets (50-100 mg total) by mouth at bedtime as needed for sleep. (Patient taking differently: Take 50-100 mg by mouth at bedtime as needed for sleep. Pt states taking two tablets) 180 tablet 0   triamcinolone cream (KENALOG) 0.1 % Apply 1 application topically 2 (two) times daily as needed (poison ivy).     No current facility-administered medications for this visit.    Allergies  Allergen Reactions   Penicillins Swelling    REACTION: throat swelling Because of a history of documented adverse serious drug reaction;[name] Alert bracelet  is recommended    Review of Systems  Constitutional:  Negative for activity change, appetite change and unexpected weight change.  HENT:  Negative for trouble swallowing and voice change.   Eyes:  Positive for visual disturbance (Blurry).  Respiratory:   Positive for cough. Negative for shortness of breath and wheezing.   Cardiovascular:  Negative for chest pain and leg swelling.  Genitourinary:  Negative for difficulty urinating and dysuria.  Musculoskeletal:  Positive for neck pain.  Neurological:  Negative for seizures, syncope and weakness.  Hematological:  Negative for adenopathy. Does not bruise/bleed easily.   BP 135/76 (BP Location: Right Arm, Patient Position: Sitting, Cuff Size: Normal)   Pulse 78   Ht 6' (1.829 m)   Wt 216 lb 9.6 oz (98.2 kg)   SpO2 94% Comment: RA  BMI 29.38 kg/m  Physical Exam Vitals reviewed.  Constitutional:      General: He is not in acute distress.    Appearance: Normal appearance.  HENT:     Head: Normocephalic and atraumatic.  Eyes:     General: No scleral icterus.    Extraocular Movements: Extraocular movements intact.  Neck:     Vascular: No carotid bruit.  Cardiovascular:     Rate and Rhythm: Normal rate and regular rhythm.     Heart sounds: Normal heart sounds. No murmur heard. Pulmonary:     Effort: Pulmonary effort is normal. No respiratory distress.     Breath sounds: Normal breath sounds. No wheezing or rales.  Abdominal:     General: There is no distension.     Palpations: Abdomen is soft.  Lymphadenopathy:     Cervical: No cervical adenopathy.  Skin:    General: Skin is warm and dry.  Neurological:     General: No focal deficit present.     Mental Status: He is alert and oriented to person, place, and time.     Cranial Nerves: No cranial nerve deficit.     Motor: No weakness.     Diagnostic Tests: NUCLEAR MEDICINE PET SKULL BASE TO THIGH   TECHNIQUE: 10.9 mCi F-18 FDG was injected intravenously. Full-ring PET imaging was performed from the skull base to thigh after the radiotracer. CT data was obtained and used for attenuation correction and anatomic localization.   Fasting blood glucose: 183 mg/dl   COMPARISON:  CT chest from 11/02/2020   FINDINGS: Mediastinal  blood pool activity: SUV max 2.6   Liver activity: SUV max NA   NECK: No significant abnormal hypermetabolic activity in this region.   Incidental CT findings: none   CHEST: The rounded region of peripheral consolidation in the right lower lobe with some internal air bronchograms has a maximum SUV of 6.5, with the higher SUV especially centrally in the denser portion of the mass. The appearance certainly raises concern for potential internal malignancy.   There is increasing interstitial accentuation in a bandlike configuration posteriorly in the right lower lobe shown on image 36 of series 8, new from 11/02/2020, with maximum SUV of 2.7.   No hypermetabolic or pathologically enlarged adenopathy in the chest is identified.   Incidental CT findings: Coronary artery and aortic atherosclerotic calcification. Emphysema.   ABDOMEN/PELVIS: Scattered segmental and multifocal bowel activity, much of which is likely physiologic and without abnormal findings on the CT data. Photopenic cyst of the left kidney upper pole.   Incidental CT findings: Atherosclerosis is present, including aortoiliac atherosclerotic disease. Incidental lipoma along the left hip adductor musculature. Nonspecific wall thickening in the stomach, but without hypermetabolic activity.   SKELETON: No significant abnormal hypermetabolic activity in this region.   Incidental CT findings: none   IMPRESSION: 1. Similar appearance of the right lower lobe mass with a high density central component and peripheral airspace opacity. The high density central component as a maximum SUV of 6.5, which is certainly suspicious for malignancy although a granulomatous infection could have a similar appearance. Tissue diagnosis recommended. 2. There is a new band of interstitial accentuation in the adjacent right upper lobe with maximum SUV of  2.7, likely inflammatory. 3. Other imaging findings of potential clinical  significance: Aortic Atherosclerosis (ICD10-I70.0) and Emphysema (ICD10-J43.9). Coronary atherosclerosis. Wall thickening in the stomach is not hypermetabolic but could be inflammatory.     Electronically Signed   By: Brandon Rubio M.D.   On: 11/29/2020 06:57  I personally reviewed the CT and PET/CT images.  There is a large right lower lobe mass that is hypermetabolic.  Biopsy-proven non-small cell carcinoma.  Also concern with abnormal area in right upper lobe although there is no discrete mass and felt to be inflammatory on PET.  Pulmonary function testing 11/16/2018 FVC 4.58 (97%) FEV1 3.11 (90%) No change with bronchodilator DLCO 21.62 (79%)  Impression: Nyal, Schachter. is a 71 year old man with a past medical history significant for CAD, MI in 2003, hypertension, hyperlipidemia, type 2 diabetes, peripheral neuropathy, DVT (after ankle fracture), reflux, and tobacco abuse.  He recently was found to have a 7 cm right lower lobe mass on a low-dose screening CT for lung cancer.  There is a large solid component.  On his super D CT the largest component measured 8.4 cm.  The mass is hypermetabolic on PET/CT.  Biopsy shows non-small cell carcinoma, probable adenocarcinoma.  There is no evidence of regional or distant metastatic disease.  There are some questionable changes in the right upper lobe laterally consistent with metastatic disease and probably inflammatory, but worrisome for a possible second primary.  We discussed potential treatment for his stage IIIa (T4, N0) non-small cell carcinoma.  He will need multimodality therapy.  I would favor neoadjuvant chemo and immunotherapy followed by resection if he has a clinical response.  I will discuss this with Dr. Julien Rubio.  Tobacco abuse -2 packs a day, greater than 100 pack years.  He needs to quit smoking before we can consider surgical resection.  He has patches but has not used them yet, saying he was going to wait-and-see how  his visit today went.  History of CAD and MI-remote MI about 20 years ago.  No current anginal symptoms.  He does have adequate pulmonary reserve to tolerate resection.  Plan: Follow-up with Dr. Julien Rubio as scheduled  I spent over 45 minutes in review of records, images, and in consultation with Mr. Cerone today. Melrose Nakayama, MD [facility] [phone]

## 2021-01-16 ENCOUNTER — Inpatient Hospital Stay (HOSPITAL_BASED_OUTPATIENT_CLINIC_OR_DEPARTMENT_OTHER): Payer: Medicare Other | Admitting: *Deleted

## 2021-01-16 ENCOUNTER — Inpatient Hospital Stay: Payer: Medicare Other | Attending: Internal Medicine | Admitting: Internal Medicine

## 2021-01-16 ENCOUNTER — Encounter: Payer: Self-pay | Admitting: Internal Medicine

## 2021-01-16 ENCOUNTER — Other Ambulatory Visit: Payer: Self-pay

## 2021-01-16 ENCOUNTER — Encounter: Payer: Self-pay | Admitting: *Deleted

## 2021-01-16 ENCOUNTER — Inpatient Hospital Stay: Payer: Medicare Other

## 2021-01-16 VITALS — BP 125/80 | HR 76 | Temp 97.7°F | Resp 19 | Ht 72.0 in | Wt 200.5 lb

## 2021-01-16 DIAGNOSIS — C3491 Malignant neoplasm of unspecified part of right bronchus or lung: Secondary | ICD-10-CM

## 2021-01-16 DIAGNOSIS — C3431 Malignant neoplasm of lower lobe, right bronchus or lung: Secondary | ICD-10-CM | POA: Insufficient documentation

## 2021-01-16 DIAGNOSIS — Z5111 Encounter for antineoplastic chemotherapy: Secondary | ICD-10-CM | POA: Diagnosis not present

## 2021-01-16 DIAGNOSIS — Z5112 Encounter for antineoplastic immunotherapy: Secondary | ICD-10-CM | POA: Insufficient documentation

## 2021-01-16 DIAGNOSIS — R5382 Chronic fatigue, unspecified: Secondary | ICD-10-CM

## 2021-01-16 DIAGNOSIS — Z23 Encounter for immunization: Secondary | ICD-10-CM | POA: Insufficient documentation

## 2021-01-16 DIAGNOSIS — Z79899 Other long term (current) drug therapy: Secondary | ICD-10-CM | POA: Diagnosis not present

## 2021-01-16 MED ORDER — INFLUENZA VAC A&B SA ADJ QUAD 0.5 ML IM PRSY
0.5000 mL | PREFILLED_SYRINGE | Freq: Once | INTRAMUSCULAR | Status: AC
  Administered 2021-01-16: 0.5 mL via INTRAMUSCULAR
  Filled 2021-01-16: qty 0.5

## 2021-01-16 MED ORDER — PROCHLORPERAZINE MALEATE 10 MG PO TABS: 10.0000 mg | ORAL_TABLET | Freq: Four times a day (QID) | ORAL | 0 refills | Status: DC | PRN

## 2021-01-16 MED ORDER — FOLIC ACID 1 MG PO TABS
1.0000 mg | ORAL_TABLET | Freq: Every day | ORAL | 4 refills | Status: DC
Start: 2021-01-16 — End: 2021-07-01

## 2021-01-16 MED ORDER — INFLUENZA VAC A&B SA ADJ QUAD 0.5 ML IM PRSY: 0.5000 mL | PREFILLED_SYRINGE | Freq: Once | INTRAMUSCULAR | Status: DC

## 2021-01-16 MED ORDER — CYANOCOBALAMIN 1000 MCG/ML IJ SOLN
1000.0000 ug | Freq: Once | INTRAMUSCULAR | Status: AC
  Administered 2021-01-16: 1000 ug via INTRAMUSCULAR
  Filled 2021-01-16: qty 1

## 2021-01-16 NOTE — Progress Notes (Signed)
Per Dr. Julien Nordmann, I notified pathology dept to send molecular and PDL 1 test on recent cytology.

## 2021-01-16 NOTE — Progress Notes (Signed)
[facility] [phone]   [phone]  OFFICE PROGRESS NOTE  Brandon Koch, MD [place] [address]  DIAGNOSIS: Stage IIIA (T4, N0, M0) non-small cell lung cancer, favoring adenocarcinoma presented with large right lower lobe lung mass with no mediastinal lymphadenopathy or extrathoracic metastasis diagnosed in October 2022.  PRIOR THERAPY: None  CURRENT THERAPY: Neoadjuvant treatment with carboplatin for AUC of 5, Alimta 500 Mg/M2 and nivolumab 360 Mg IV every 3 weeks.  First dose January 23, 2021.  INTERVAL HISTORY: Brandon Rubio. 70 y.o. male returns to the clinic today for follow-up visit.  The patient is feeling fine today with no concerning complaints.  He was seen recently by Dr. Roxan Hockey for consideration of surgical resection and Dr. Roxan Hockey recommended for the patient a course of neoadjuvant chemotherapy with immunotherapy with the hope to improve his surgical outcome.  The patient denied having any current chest pain, shortness of breath, cough or hemoptysis.  He denied having any fever or chills.  He has no nausea, vomiting, diarrhea or constipation.  He has no headache or visual changes.  He has no recent weight loss or night sweats.  He is here today for evaluation and discussion of this treatment option.  MEDICAL HISTORY: Past Medical History:  Diagnosis Date   CAD (coronary artery disease)    Cancer (Brandon Rubio)    SKIN.Brandon KitchenBASAL CELL   Cataract    Diabetes mellitus    2   DVT (deep venous thrombosis) (Brandon Rubio) 03/30/2001   RLE DVT, post ankle fracture   GERD (gastroesophageal reflux disease)    Hyperlipidemia    Hypertension    Lung cancer (Brandon Rubio) 12/10/2020   Myocardial infarct, 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   Peripheral neuropathy    toes    ALLERGIES:  is allergic to penicillins.  MEDICATIONS:  Current Outpatient Medications  Medication Sig Dispense Refill   aspirin EC 81 MG tablet Take 81 mg by mouth in  the morning. Swallow whole.     empagliflozin (JARDIANCE) 25 MG TABS tablet Take 1 tablet (25 mg total) by mouth daily. 90 tablet 3   glucose blood (FREESTYLE LITE) test strip USE TO TEST BLOOD SUGAR TWICE DAILY ICD E11.49 100 each 3   Melatonin 5 MG CHEW Chew 5 mg by mouth at bedtime.     metFORMIN (GLUCOPHAGE) 1000 MG tablet TAKE 1 TABLET BY MOUTH TWICE DAILY WITH A MEAL 180 tablet 1   metoprolol succinate (TOPROL-XL) 50 MG 24 hr tablet TAKE 1 TABLET BY MOUTH EVERY DAY. 90 tablet 0   Multiple Vitamin (MULTIVITAMIN WITH MINERALS) TABS tablet Take 1 tablet by mouth daily. Centrum Silver     nicotine (NICODERM CQ - DOSED IN MG/24 HOURS) 21 mg/24hr patch Place 1 patch (21 mg total) onto the skin daily. 28 patch 0   omeprazole (PRILOSEC) 20 MG capsule TAKE 1 CAPSULE(20 MG) BY MOUTH TWICE DAILY BEFORE A MEAL 180 capsule 3   simvastatin (ZOCOR) 40 MG tablet Take 1 tablet (40 mg total) by mouth daily at 6 PM. 90 tablet 3   traZODone (DESYREL) 50 MG tablet Take 1-2 tablets (50-100 mg total) by mouth at bedtime as needed for sleep. (Patient taking differently: Take 50-100 mg by mouth at bedtime as needed for sleep. Pt states taking two tablets) 180 tablet 0   triamcinolone cream (KENALOG) 0.1 % Apply 1 application topically 2 (two) times daily as needed (poison ivy).     No current facility-administered medications  for this visit.    SURGICAL HISTORY:  Past Surgical History:  Procedure Laterality Date   BRONCHIAL BIOPSY  12/10/2020   Procedure: BRONCHIAL BIOPSIES;  Surgeon: Brandon Gobble, MD;  Location: [facility] ENDOSCOPY;  Service: Pulmonary;;   BRONCHIAL BRUSHINGS  12/10/2020   Procedure: BRONCHIAL BRUSHINGS;  Surgeon: Brandon Gobble, MD;  Location: [facility] ENDOSCOPY;  Service: Pulmonary;;   BRONCHIAL NEEDLE ASPIRATION BIOPSY  12/10/2020   Procedure: BRONCHIAL NEEDLE ASPIRATION BIOPSIES;  Surgeon: Brandon Gobble, MD;  Location: [facility] ENDOSCOPY;  Service: Pulmonary;;   COLONOSCOPY     COLONOSCOPY W/  POLYPECTOMY  03/10/2009   LARYNGOSCOPY  03/10/1997   VIDEO BRONCHOSCOPY WITH ENDOBRONCHIAL NAVIGATION N/A 12/10/2020   Procedure: ROBOTIC [place];  Surgeon: Brandon Gobble, MD;  Location: [facility] [place] [facility] ENDOSCOPY;  Service: Pulmonary;  Laterality: N/A;    REVIEW OF SYSTEMS:  Constitutional: negative Eyes: negative Ears, nose, mouth, throat, and face: negative Respiratory: negative Cardiovascular: negative Gastrointestinal: negative Genitourinary:negative Integument/breast: negative Hematologic/lymphatic: negative Musculoskeletal:negative Neurological: negative Behavioral/Psych: negative Endocrine: negative Allergic/Immunologic: negative   PHYSICAL EXAMINATION: General appearance: alert, cooperative, and no distress Head: Normocephalic, without obvious abnormality, atraumatic Neck: no adenopathy, no JVD, supple, symmetrical, trachea midline, and thyroid not enlarged, symmetric, no tenderness/mass/nodules Lymph nodes: Cervical, supraclavicular, and axillary nodes normal. Resp: clear to auscultation bilaterally Back: symmetric, no curvature. ROM normal. No CVA tenderness. Cardio: regular rate and rhythm, S1, S2 normal, no murmur, click, rub or gallop GI: soft, non-tender; bowel sounds normal; no masses,  no organomegaly Extremities: extremities normal, atraumatic, no cyanosis or edema Neurologic: Alert and oriented X 3, normal strength and tone. Normal symmetric reflexes. Normal coordination and gait  ECOG PERFORMANCE STATUS: 1 - Symptomatic but completely ambulatory  Blood pressure 125/80, pulse 76, temperature 97.7 F (36.5 C), temperature source Tympanic, resp. rate 19, height 6' (1.829 m), weight 200 lb 8 oz (90.9 kg), SpO2 96 %.  LABORATORY DATA: Lab Results  Component Value Date   WBC 7.8 12/24/2020   HGB 15.0 12/24/2020   HCT 43.1 12/24/2020   MCV 86.7 12/24/2020   PLT 218 12/24/2020      Chemistry      Component Value Date/Time   NA  139 12/24/2020 1039   K 4.0 12/24/2020 1039   CL 107 12/24/2020 1039   CO2 21 (L) 12/24/2020 1039   BUN 14 12/24/2020 1039   CREATININE 0.96 12/24/2020 1039      Component Value Date/Time   CALCIUM 9.3 12/24/2020 1039   ALKPHOS 62 12/24/2020 1039   AST 14 (L) 12/24/2020 1039   ALT 17 12/24/2020 1039   BILITOT 0.5 12/24/2020 1039       RADIOGRAPHIC STUDIES: DG Eye Foreign Body  Result Date: 01/01/2021 CLINICAL DATA:  Metal working/exposure; clearance prior to MRI EXAM: ORBITS FOR FOREIGN BODY - 2 VIEW COMPARISON:  None. FINDINGS: There is no evidence of metallic foreign body within the orbits. No significant bone abnormality identified. IMPRESSION: No evidence of metallic foreign body within the orbits. Electronically Signed   By: Ileana Roup M.D.   On: 01/01/2021 08:12   MR BRAIN W WO CONTRAST  Result Date: 01/02/2021 CLINICAL DATA:  Non-small cell lung cancer, staging EXAM: MRI HEAD WITHOUT AND WITH CONTRAST TECHNIQUE: Multiplanar, multiecho pulse sequences of the brain and surrounding structures were obtained without and with intravenous contrast. CONTRAST:  46mL GADAVIST GADOBUTROL 1 MMOL/ML IV SOLN COMPARISON:  None. FINDINGS: Brain: There is no acute infarction or intracranial hemorrhage. There is no intracranial  mass, mass effect, or edema. There is no hydrocephalus or extra-axial fluid collection. Prominence of the ventricles and sulci reflects minor parenchymal volume loss. Minimal punctate foci of T2 hyperintensity in the supratentorial white matter may reflect minor chronic microvascular ischemic changes or gliosis/demyelination of other etiologies. No abnormal enhancement. Vascular: Major vessel flow voids at the skull base are preserved. Skull and upper cervical spine: Normal marrow signal is preserved. Sinuses/Orbits: Paranasal sinuses are aerated. Orbits are unremarkable. Other: Sella is unremarkable.  Mastoid air cells are clear. Electronically Signed   By: Macy Mis  M.D.   On: 01/02/2021 14:46    ASSESSMENT AND PLAN:  This is a very pleasant 70 years old white male recently diagnosed with a stage IIIa (T4, N0, M0) non-small cell lung cancer, adenocarcinoma presented with large right lower lobe lung mass with no mediastinal lymphadenopathy or extra thoracic metastasis diagnosed in October 2022 The patient had MRI of the brain performed recently.  His MRI showed no concerning findings for disease metastasis to the brain. I had a lengthy discussion with the patient today about his current condition and treatment options. I recommended for the patient to proceed with a course of neoadjuvant chemotherapy plus immunotherapy with carboplatin for AUC of 5, Alimta 500 Mg/M2 and nivolumab 360 Mg IV every 3 weeks for a total of 3 cycles.  I discussed with the patient the adverse effect of this treatment including but not limited to alopecia, myelosuppression, nausea and vomiting, peripheral neuropathy, liver or renal dysfunction as well as immunotherapy adverse effects. The patient will have a chemotherapy education class before the first dose of his treatment. He will receive vitamin B12 injection today. I will send prescription of folic acid and Compazine to his pharmacy. He is expected to start the first cycle of this treatment next week. He will come back for follow-up visit in 2 weeks for evaluation and management of any adverse effect of his treatment. The patient was advised to call immediately if he has any other concerning symptoms in the interval. The patient voices understanding of current disease status and treatment options and is in agreement with the current care plan.  All questions were answered. The patient knows to call the clinic with any problems, questions or concerns. We can certainly see the patient much sooner if necessary.  The total time spent in the appointment was 40 minutes.  Disclaimer: This note was dictated with voice recognition  software. Similar sounding words can inadvertently be transcribed and may not be corrected upon review.

## 2021-01-16 NOTE — Progress Notes (Signed)
START OFF PATHWAY REGIMEN - Non-Small Cell Lung   [id number]:Carboplatin AUC=5 + Pemetrexed 500 mg/m2 q21 Days:   A cycle is every 21 days:     Pemetrexed      Carboplatin   **Always confirm dose/schedule in your pharmacy ordering system**  Patient Characteristics: Preoperative or Nonsurgical Candidate (Clinical Staging), Stage IIIA - Surgical Candidate, Neoadjuvant Chemo/Immunotherapy Preferred, EGFR Positive/Unknown or ALK Positive/Unknown, Nonsquamous Cell Therapeutic Status: Preoperative or Nonsurgical Candidate (Clinical Staging) AJCC T Category: cT4 AJCC N Category: cN0 AJCC M Category: cM0 AJCC 8 Stage Grouping: IIIA Histology: Nonsquamous Cell ALK Rearrangement Status: Quantity Not Sufficient EGFR Mutation Status: Quantity Not Sufficient Intent of Therapy: Curative Intent, Discussed with Patient 

## 2021-01-16 NOTE — Progress Notes (Signed)
Oncology Nurse Navigator Documentation  Oncology Nurse Navigator Flowsheets 01/16/2021  Abnormal Finding Date 11/05/2020  Confirmed Diagnosis Date 12/10/2020  Diagnosis Status Confirmed Diagnosis Complete  Planned Course of Treatment Neo Chemo;Targeted Therapy  Phase of Treatment Targeted Therapy  Navigator Follow Up Date: 01/18/2021  Navigator Follow Up Reason: Appointment Review  Navigator Location [facility]  Navigator Encounter Type Clinic/MDC  Patient Visit Type MedOnc  Treatment Phase Pre-Tx/Tx Discussion  Barriers/Navigation Needs Education  Education Newly Diagnosed Cancer Education;Other  Interventions Education;None Required  Acuity Level 2-Minimal Needs (1-2 Barriers Identified)  Education Method Verbal;Written  Time Spent with Patient 30

## 2021-01-18 ENCOUNTER — Encounter: Payer: Self-pay | Admitting: *Deleted

## 2021-01-18 ENCOUNTER — Telehealth: Payer: Self-pay | Admitting: Internal Medicine

## 2021-01-18 MED ORDER — METOPROLOL SUCCINATE ER 50 MG PO TB24: 50.0000 mg | ORAL_TABLET | Freq: Every day | ORAL | 1 refills | Status: DC

## 2021-01-18 NOTE — Progress Notes (Signed)
Oncology Nurse Navigator Documentation  Oncology Nurse Navigator Flowsheets 01/18/2021 01/16/2021  Abnormal Finding Date - 11/05/2020  Confirmed Diagnosis Date - 12/10/2020  Diagnosis Status - Confirmed Diagnosis Complete  Planned Course of Treatment Neo Chemo;Targeted Therapy Neo Chemo;Targeted Therapy  Phase of Treatment Targeted Therapy Targeted Therapy  Chemotherapy Actual Start Date: 01/24/2021 -  Targeted Therapy Actual Start Date: 01/24/2021 -  Navigator Follow Up Date: 01/21/2021 01/18/2021  Navigator Follow Up Reason: Education Appointment Review  Navigator Location [facility]  Navigator Encounter Type Telephone Clinic/[facility]  Telephone Education;Outgoing Call;Asess Navigation Needs -  Treatment Initiated Date 01/24/2021 -  Patient Visit Type Other MedOnc  Treatment Phase Pre-Tx/Tx Discussion Pre-Tx/Tx Discussion  Barriers/Navigation Needs Coordination of Care;Education Education  Education Other Newly Diagnosed Cancer Education;Other  Interventions Coordination of Care;Education;Psycho-Social Support Education;None Required  Acuity Level 2-Minimal Needs (1-2 Barriers Identified) Level 2-Minimal Needs (1-2 Barriers Identified)  Education Method Verbal Verbal;Written  Time Spent with Patient 15 30

## 2021-01-18 NOTE — Telephone Encounter (Signed)
1.Medication Requested: metoprolol succinate (TOPROL-XL) 50 MG 24 hr tablet  2. Pharmacy (Name, [facility]): [facility]  3. On Med List: yes   4. Last Visit with PCP: 09-25-2020  5. Next visit date with PCP: n/a  Patient states he has one dose of medication left

## 2021-01-21 ENCOUNTER — Telehealth: Payer: Self-pay | Admitting: *Deleted

## 2021-01-21 NOTE — Telephone Encounter (Signed)
I follow up with Dr. Julien Nordmann today regarding [name] and if should be taking steroids.  He states no due to the Opdivo.  I called and updated patient. He was thankful for the call.

## 2021-01-23 ENCOUNTER — Other Ambulatory Visit: Payer: Self-pay

## 2021-01-23 ENCOUNTER — Inpatient Hospital Stay: Payer: Medicare Other

## 2021-01-23 ENCOUNTER — Encounter: Payer: Self-pay | Admitting: Internal Medicine

## 2021-01-23 NOTE — Progress Notes (Signed)
Met with patient at registration to introduce myself as Financial Resource Specialist and to offer available resources.  Discussed one-time $1000 [employer] grant and qualifications to assist with personal expenses while going through treatment.  Gave him my card if interested in applying and for any additional financial questions or concerns.  

## 2021-01-24 ENCOUNTER — Encounter: Payer: Self-pay | Admitting: *Deleted

## 2021-01-24 ENCOUNTER — Inpatient Hospital Stay: Payer: Medicare Other

## 2021-01-24 ENCOUNTER — Other Ambulatory Visit: Payer: Self-pay | Admitting: Internal Medicine

## 2021-01-24 VITALS — BP 117/73 | HR 75 | Temp 98.2°F | Resp 18 | Wt 218.0 lb

## 2021-01-24 DIAGNOSIS — Z5111 Encounter for antineoplastic chemotherapy: Secondary | ICD-10-CM | POA: Diagnosis not present

## 2021-01-24 DIAGNOSIS — R5382 Chronic fatigue, unspecified: Secondary | ICD-10-CM

## 2021-01-24 DIAGNOSIS — Z23 Encounter for immunization: Secondary | ICD-10-CM | POA: Diagnosis not present

## 2021-01-24 DIAGNOSIS — Z79899 Other long term (current) drug therapy: Secondary | ICD-10-CM | POA: Diagnosis not present

## 2021-01-24 DIAGNOSIS — C3491 Malignant neoplasm of unspecified part of right bronchus or lung: Secondary | ICD-10-CM

## 2021-01-24 DIAGNOSIS — C3431 Malignant neoplasm of lower lobe, right bronchus or lung: Secondary | ICD-10-CM | POA: Diagnosis not present

## 2021-01-24 LAB — CMP (CANCER CENTER ONLY)
ALT: 18 U/L (ref 0–44)
AST: 14 U/L — ABNORMAL LOW (ref 15–41)
Albumin: 4.3 g/dL (ref 3.5–5.0)
Alkaline Phosphatase: 70 U/L (ref 38–126)
Anion gap: 12 (ref 5–15)
BUN: 15 mg/dL (ref 8–23)
CO2: 20 mmol/L — ABNORMAL LOW (ref 22–32)
Calcium: 9.1 mg/dL (ref 8.9–10.3)
Chloride: 106 mmol/L (ref 98–111)
Creatinine: 0.99 mg/dL (ref 0.61–1.24)
GFR, Estimated: >60 mL/min (ref >60)
Glucose, Bld: 180 mg/dL — ABNORMAL HIGH (ref 70–99)
Potassium: 4.2 mmol/L (ref 3.5–5.1)
Sodium: 138 mmol/L (ref 135–145)
Total Bilirubin: 0.3 mg/dL (ref 0.3–1.2)
Total Protein: 7.4 g/dL (ref 6.5–8.1)

## 2021-01-24 LAB — CBC WITH DIFFERENTIAL (CANCER CENTER ONLY)
Abs Immature Granulocytes: 0.03 10*3/uL (ref 0.00–0.07)
Basophils Absolute: 0.1 10*3/uL (ref 0.0–0.1)
Basophils Relative: 1 %
Eosinophils Absolute: 0.1 10*3/uL (ref 0.0–0.5)
Eosinophils Relative: 2 %
HCT: 45.6 % (ref 39.0–52.0)
Hemoglobin: 15.5 g/dL (ref 13.0–17.0)
Immature Granulocytes: 0 %
Lymphocytes Relative: 32 %
Lymphs Abs: 2.7 10*3/uL (ref 0.7–4.0)
MCH: 29.9 pg (ref 26.0–34.0)
MCHC: 34 g/dL (ref 30.0–36.0)
MCV: 87.9 fL (ref 80.0–100.0)
Monocytes Absolute: 0.6 10*3/uL (ref 0.1–1.0)
Monocytes Relative: 7 %
Neutro Abs: 5.1 10*3/uL (ref 1.7–7.7)
Neutrophils Relative %: 58 %
Platelet Count: 202 10*3/uL (ref 150–400)
RBC: 5.19 MIL/uL (ref 4.22–5.81)
RDW: 12.6 % (ref 11.5–15.5)
WBC Count: 8.7 10*3/uL (ref 4.0–10.5)
nRBC: 0 % (ref 0.0–0.2)

## 2021-01-24 LAB — TSH: TSH: 1.244 u[IU]/mL (ref 0.320–4.118)

## 2021-01-24 MED ORDER — SODIUM CHLORIDE 0.9 % IV SOLN: Freq: Once | INTRAVENOUS | Status: AC

## 2021-01-24 MED ORDER — SODIUM CHLORIDE 0.9 % IV SOLN
554.5000 mg | Freq: Once | INTRAVENOUS | Status: AC
  Administered 2021-01-24: 12:00:00 550 mg via INTRAVENOUS
  Filled 2021-01-24: qty 55

## 2021-01-24 MED ORDER — SODIUM CHLORIDE 0.9 % IV SOLN
150.0000 mg | Freq: Once | INTRAVENOUS | Status: AC
  Administered 2021-01-24: 10:00:00 150 mg via INTRAVENOUS
  Filled 2021-01-24: qty 150

## 2021-01-24 MED ORDER — PALONOSETRON HCL INJECTION 0.25 MG/5ML
0.2500 mg | Freq: Once | INTRAVENOUS | Status: AC
Start: 2021-01-24 — End: 2021-01-24
  Administered 2021-01-24: 09:00:00 0.25 mg via INTRAVENOUS
  Filled 2021-01-24: qty 5

## 2021-01-24 MED ORDER — SODIUM CHLORIDE 0.9 % IV SOLN
10.0000 mg | Freq: Once | INTRAVENOUS | Status: AC
  Administered 2021-01-24: 10:00:00 10 mg via INTRAVENOUS
  Filled 2021-01-24: qty 10

## 2021-01-24 MED ORDER — SODIUM CHLORIDE 0.9 % IV SOLN
500.0000 mg/m2 | Freq: Once | INTRAVENOUS | Status: AC
  Administered 2021-01-24: 11:00:00 1100 mg via INTRAVENOUS
  Filled 2021-01-24: qty 40

## 2021-01-24 MED ORDER — SODIUM CHLORIDE 0.9 % IV SOLN
360.0000 mg | Freq: Once | INTRAVENOUS | Status: AC
  Administered 2021-01-24: 11:00:00 360 mg via INTRAVENOUS
  Filled 2021-01-24: qty 12

## 2021-01-24 NOTE — Progress Notes (Signed)
Oncology Nurse Navigator Documentation  Oncology Nurse Navigator Flowsheets 01/24/2021 01/18/2021 01/16/2021  Abnormal Finding Date - - 11/05/2020  Confirmed Diagnosis Date - - 12/10/2020  Diagnosis Status - - Confirmed Diagnosis Complete  Planned Course of Treatment - Neo Chemo;Targeted Therapy Neo Chemo;Targeted Therapy  Phase of Treatment - Targeted Therapy Targeted Therapy  Chemotherapy Actual Start Date: - 01/24/2021 -  Targeted Therapy Actual Start Date: - 01/24/2021 -  Navigator Follow Up Date: 02/06/2021 01/21/2021 01/18/2021  Navigator Follow Up Reason: Follow-up Appointment Education Appointment Review  [facility]  Navigator Encounter Type Other: Telephone Clinic/[facility]C  Telephone - Education;Outgoing Call;Asess Navigation Needs -  Treatment Initiated Date - 01/24/2021 -  Patient Visit Type Other Other MedOnc  Treatment Phase Treatment Pre-Tx/Tx Discussion Pre-Tx/Tx Discussion  Barriers/Navigation Needs Coordination of Care Coordination of Care;Education Education  Education - Other Newly Diagnosed Cancer Education;Other  Interventions Coordination of Care Coordination of Care;Education;Psycho-Social Support Education;None Required  Acuity Level 2-Minimal Needs (1-2 Barriers Identified) Level 2-Minimal Needs (1-2 Barriers Identified) Level 2-Minimal Needs (1-2 Barriers Identified)  Coordination of Care Pathology/I received a message from pathology there is not enough tissue to testing with [facility]. I updated Dr. Julien Nordmann.  - -  Education Method - Verbal Verbal;Written  Time Spent with Patient [phone]

## 2021-01-24 NOTE — Patient Instructions (Signed)
[facility]   Discharge Instructions: Thank you for choosing [facility] to provide your oncology and hematology care.   If you have a lab appointment with the [facility], please go directly to the [facility] and check in at the registration area.   Wear comfortable clothing and clothing appropriate for easy access to any Portacath or PICC line.   We strive to give you quality time with your provider. You may need to reschedule your appointment if you arrive late (15 or more minutes).  Arriving late affects you and other patients whose appointments are after yours.  Also, if you miss three or more appointments without notifying the office, you may be dismissed from the clinic at the provider's discretion.      For prescription refill requests, have your pharmacy contact our office and allow 72 hours for refills to be completed.    Today you received the following chemotherapy and/or immunotherapy agents: carboplatin, nivolumab, and pemetrexed.      To help prevent nausea and vomiting after your treatment, we encourage you to take your nausea medication as directed.  BELOW ARE SYMPTOMS THAT SHOULD BE REPORTED IMMEDIATELY: *FEVER GREATER THAN 100.4 F (38 C) OR HIGHER *CHILLS OR SWEATING *NAUSEA AND VOMITING THAT IS NOT CONTROLLED WITH YOUR NAUSEA MEDICATION *UNUSUAL SHORTNESS OF BREATH *UNUSUAL BRUISING OR BLEEDING *URINARY PROBLEMS (pain or burning when urinating, or frequent urination) *BOWEL PROBLEMS (unusual diarrhea, constipation, pain near the anus) TENDERNESS IN MOUTH AND THROAT WITH OR WITHOUT PRESENCE OF ULCERS (sore throat, sores in mouth, or a toothache) UNUSUAL RASH, SWELLING OR PAIN  UNUSUAL VAGINAL DISCHARGE OR ITCHING   Items with * indicate a potential emergency and should be followed up as soon as possible or go to the Emergency Department if any problems should occur.  Please show the CHEMOTHERAPY ALERT CARD or  IMMUNOTHERAPY ALERT CARD at check-in to the Emergency Department and triage nurse.  Should you have questions after your visit or need to cancel or reschedule your appointment, please contact [facility]  Dept: [phone]  and follow the prompts.  Office hours are 8:00 a.m. to 4:30 p.m. Monday - Friday. Please note that voicemails left after 4:00 p.m. may not be returned until the following business day.  We are closed weekends and major holidays. You have access to a nurse at all times for urgent questions. Please call the main number to the clinic Dept: [phone] and follow the prompts.   For any non-urgent questions, you may also contact your provider using MyChart. We now offer e-Visits for anyone 65 and older to request care online for non-urgent symptoms. For details visit [email or web address].   Also download the MyChart app! Go to the app store, search "MyChart", open the app, select [facility], and log in with your MyChart username and password.  Due to Covid, a mask is required upon entering the hospital/clinic. If you do not have a mask, one will be given to you upon arrival. For doctor visits, patients may have 1 support person aged 88 or older with them. For treatment visits, patients cannot have anyone with them due to current Covid guidelines and our immunocompromised population.   Carboplatin injection What is this medication? CARBOPLATIN (KAR boe pla tin) is a chemotherapy drug. It targets fast dividing cells, like cancer cells, and causes these cells to die. This medicine is used to treat ovarian cancer and many other cancers. This medicine may be  used for other purposes; ask your health care provider or pharmacist if you have questions. COMMON BRAND NAME(S): Paraplatin What should I tell my care team before I take this medication? They need to know if you have any of these conditions: blood disorders hearing problems kidney disease recent  or ongoing radiation therapy an unusual or allergic reaction to carboplatin, cisplatin, other chemotherapy, other medicines, foods, dyes, or preservatives pregnant or trying to get pregnant breast-feeding How should I use this medication? This drug is usually given as an infusion into a vein. It is administered in a hospital or clinic by a specially trained health care professional. Talk to your pediatrician regarding the use of this medicine in children. Special care may be needed. Overdosage: If you think you have taken too much of this medicine contact a poison control center or emergency room at once. NOTE: This medicine is only for you. Do not share this medicine with others. What if I miss a dose? It is important not to miss a dose. Call your doctor or health care professional if you are unable to keep an appointment. What may interact with this medication? medicines for seizures medicines to increase blood counts like filgrastim, pegfilgrastim, sargramostim some antibiotics like amikacin, gentamicin, neomycin, streptomycin, tobramycin vaccines Talk to your doctor or health care professional before taking any of these medicines: acetaminophen aspirin ibuprofen ketoprofen naproxen This list may not describe all possible interactions. Give your health care provider a list of all the medicines, herbs, non-prescription drugs, or dietary supplements you use. Also tell them if you smoke, drink alcohol, or use illegal drugs. Some items may interact with your medicine. What should I watch for while using this medication? Your condition will be monitored carefully while you are receiving this medicine. You will need important blood work done while you are taking this medicine. This drug may make you feel generally unwell. This is not uncommon, as chemotherapy can affect healthy cells as well as cancer cells. Report any side effects. Continue your course of treatment even though you feel ill  unless your doctor tells you to stop. In some cases, you may be given additional medicines to help with side effects. Follow all directions for their use. Call your doctor or health care professional for advice if you get a fever, chills or sore throat, or other symptoms of a cold or flu. Do not treat yourself. This drug decreases your body's ability to fight infections. Try to avoid being around people who are sick. This medicine may increase your risk to bruise or bleed. Call your doctor or health care professional if you notice any unusual bleeding. Be careful brushing and flossing your teeth or using a toothpick because you may get an infection or bleed more easily. If you have any dental work done, tell your dentist you are receiving this medicine. Avoid taking products that contain aspirin, acetaminophen, ibuprofen, naproxen, or ketoprofen unless instructed by your doctor. These medicines may hide a fever. Do not become pregnant while taking this medicine. Women should inform their doctor if they wish to become pregnant or think they might be pregnant. There is a potential for serious side effects to an unborn child. Talk to your health care professional or pharmacist for more information. Do not breast-feed an infant while taking this medicine. What side effects may I notice from receiving this medication? Side effects that you should report to your doctor or health care professional as soon as possible: allergic reactions like skin rash,  itching or hives, swelling of the face, lips, or tongue signs of infection - fever or chills, cough, sore throat, pain or difficulty passing urine signs of decreased platelets or bleeding - bruising, pinpoint red spots on the skin, black, tarry stools, nosebleeds signs of decreased red blood cells - unusually weak or tired, fainting spells, lightheadedness breathing problems changes in hearing changes in vision chest pain high blood pressure low blood  counts - This drug may decrease the number of white blood cells, red blood cells and platelets. You may be at increased risk for infections and bleeding. nausea and vomiting pain, swelling, redness or irritation at the injection site pain, tingling, numbness in the hands or feet problems with balance, talking, walking trouble passing urine or change in the amount of urine Side effects that usually do not require medical attention (report to your doctor or health care professional if they continue or are bothersome): hair loss loss of appetite metallic taste in the mouth or changes in taste This list may not describe all possible side effects. Call your doctor for medical advice about side effects. You may report side effects to FDA at 1-800-FDA-1088. Where should I keep my medication? This drug is given in a hospital or clinic and will not be stored at home. NOTE: This sheet is a summary. It may not cover all possible information. If you have questions about this medicine, talk to your doctor, pharmacist, or health care provider.  2022 Elsevier/Gold Standard (2007-08-04 00:00:00)  Nivolumab injection What is this medication? NIVOLUMAB (nye VOL ue mab) is a monoclonal antibody. It treats certain types of cancer. Some of the cancers treated are colon cancer, head and neck cancer, Hodgkin lymphoma, lung cancer, and melanoma. This medicine may be used for other purposes; ask your health care provider or pharmacist if you have questions. COMMON BRAND NAME(S): Opdivo What should I tell my care team before I take this medication? They need to know if you have any of these conditions: Autoimmune diseases such as Crohn's disease, ulcerative colitis, or lupus Have had or planning to have an allogeneic stem cell transplant (uses someone else's stem cells) History of chest radiation Organ transplant Nervous system problems such as myasthenia gravis or Guillain-Barre syndrome An unusual or allergic  reaction to nivolumab, other medicines, foods, dyes, or preservatives Pregnant or trying to get pregnant Breast-feeding How should I use this medication? This medication is injected into a vein. It is given in a hospital or clinic setting. A special MedGuide will be given to you before each treatment. Be sure to read this information carefully each time. Talk to your care team regarding the use of this medication in children. While it may be prescribed for children as young as 12 years for selected conditions, precautions do apply. Overdosage: If you think you have taken too much of this medicine contact a poison control center or emergency room at once. NOTE: This medicine is only for you. Do not share this medicine with others. What if I miss a dose? Keep appointments for follow-up doses. It is important not to miss your dose. Call your care team if you are unable to keep an appointment. What may interact with this medication? Interactions have not been studied. This list may not describe all possible interactions. Give your health care provider a list of all the medicines, herbs, non-prescription drugs, or dietary supplements you use. Also tell them if you smoke, drink alcohol, or use illegal drugs. Some items may interact with  your medicine. What should I watch for while using this medication? Your condition will be monitored carefully while you are receiving this medication. You may need blood work done while you are taking this medication. Do not become pregnant while taking this medication or for 5 months after stopping it. Women should inform their care team if they wish to become pregnant or think they might be pregnant. There is a potential for serious harm to an unborn child. Talk to your care team for more information. Do not breast-feed an infant while taking this medication or for 5 months after stopping it. What side effects may I notice from receiving this medication? Side effects  that you should report to your care team as soon as possible: Allergic reactions--skin rash, itching, hives, swelling of the face, lips, tongue, or throat Bloody or black, tar-like stools Change in vision Chest pain Diarrhea Dry cough, shortness of breath or trouble breathing Eye pain Fast or irregular heartbeat Fever, chills High blood sugar (hyperglycemia)--increased thirst or amount of urine, unusual weakness or fatigue, blurry vision High thyroid levels (hyperthyroidism)--fast or irregular heartbeat, weight loss, excessive sweating or sensitivity to heat, tremors or shaking, anxiety, nervousness, irregular menstrual cycle or spotting Kidney injury--decrease in the amount of urine, swelling of the ankles, hands, or feet Liver injury--right upper belly pain, loss of appetite, nausea, light-colored stool, dark yellow or brown urine, yellowing skin or eyes, unusual weakness or fatigue Low red blood cell count--unusual weakness or fatigue, dizziness, headache, trouble breathing Low thyroid levels (hypothyroidism)--unusual weakness or fatigue, increased sensitivity to cold, constipation, hair loss, dry skin, weight gain, feelings of depression Mood and behavior changes-confusion, change in sex drive or performance, irritability Muscle pain or cramps Pain, tingling, or numbness in the hands or feet, muscle weakness, trouble walking, loss of balance or coordination Red or dark brown urine Redness, blistering, peeling, or loosening of the skin, including inside the mouth Stomach pain Unusual bruising or bleeding Side effects that usually do not require medical attention (report to your care team if they continue or are bothersome): Bone pain Constipation Loss of appetite Nausea Tiredness Vomiting This list may not describe all possible side effects. Call your doctor for medical advice about side effects. You may report side effects to FDA at 1-800-FDA-1088. Where should I keep my  medication? This medication is given in a hospital or clinic and will not be stored at home. NOTE: This sheet is a summary. It may not cover all possible information. If you have questions about this medicine, talk to your doctor, pharmacist, or health care provider.  2022 Elsevier/Gold Standard (2020-11-13 00:00:00)   Pemetrexed injection What is this medication? PEMETREXED (PEM e TREX ed) is a chemotherapy drug used to treat lung cancers like non-small cell lung cancer and mesothelioma. It may also be used to treat other cancers. This medicine may be used for other purposes; ask your health care provider or pharmacist if you have questions. COMMON BRAND NAME(S): Alimta, PEMFEXY What should I tell my care team before I take this medication? They need to know if you have any of these conditions: infection (especially a virus infection such as chickenpox, cold sores, or herpes) kidney disease low blood counts, like low white cell, platelet, or red cell counts lung or breathing disease, like asthma radiation therapy an unusual or allergic reaction to pemetrexed, other medicines, foods, dyes, or preservative pregnant or trying to get pregnant breast-feeding How should I use this medication? This drug is given as an  infusion into a vein. It is administered in a hospital or clinic by a specially trained health care professional. Talk to your pediatrician regarding the use of this medicine in children. Special care may be needed. Overdosage: If you think you have taken too much of this medicine contact a poison control center or emergency room at once. NOTE: This medicine is only for you. Do not share this medicine with others. What if I miss a dose? It is important not to miss your dose. Call your doctor or health care professional if you are unable to keep an appointment. What may interact with this medication? This medicine may interact with the following medications: Ibuprofen This list  may not describe all possible interactions. Give your health care provider a list of all the medicines, herbs, non-prescription drugs, or dietary supplements you use. Also tell them if you smoke, drink alcohol, or use illegal drugs. Some items may interact with your medicine. What should I watch for while using this medication? Visit your doctor for checks on your progress. This drug may make you feel generally unwell. This is not uncommon, as chemotherapy can affect healthy cells as well as cancer cells. Report any side effects. Continue your course of treatment even though you feel ill unless your doctor tells you to stop. In some cases, you may be given additional medicines to help with side effects. Follow all directions for their use. Call your doctor or health care professional for advice if you get a fever, chills or sore throat, or other symptoms of a cold or flu. Do not treat yourself. This drug decreases your body's ability to fight infections. Try to avoid being around people who are sick. This medicine may increase your risk to bruise or bleed. Call your doctor or health care professional if you notice any unusual bleeding. Be careful brushing and flossing your teeth or using a toothpick because you may get an infection or bleed more easily. If you have any dental work done, tell your dentist you are receiving this medicine. Avoid taking products that contain aspirin, acetaminophen, ibuprofen, naproxen, or ketoprofen unless instructed by your doctor. These medicines may hide a fever. Call your doctor or health care professional if you get diarrhea or mouth sores. Do not treat yourself. To protect your kidneys, drink water or other fluids as directed while you are taking this medicine. Do not become pregnant while taking this medicine or for 6 months after stopping it. Women should inform their doctor if they wish to become pregnant or think they might be pregnant. Men should not father a child  while taking this medicine and for 3 months after stopping it. This may interfere with the ability to father a child. You should talk to your doctor or health care professional if you are concerned about your fertility. There is a potential for serious side effects to an unborn child. Talk to your health care professional or pharmacist for more information. Do not breast-feed an infant while taking this medicine or for 1 week after stopping it. What side effects may I notice from receiving this medication? Side effects that you should report to your doctor or health care professional as soon as possible: allergic reactions like skin rash, itching or hives, swelling of the face, lips, or tongue breathing problems redness, blistering, peeling or loosening of the skin, including inside the mouth signs and symptoms of bleeding such as bloody or black, tarry stools; red or dark-brown urine; spitting up blood or  brown material that looks like coffee grounds; red spots on the skin; unusual bruising or bleeding from the eye, gums, or nose signs and symptoms of infection like fever or chills; cough; sore throat; pain or trouble passing urine signs and symptoms of kidney injury like trouble passing urine or change in the amount of urine signs and symptoms of liver injury like dark yellow or brown urine; general ill feeling or flu-like symptoms; light-colored stools; loss of appetite; nausea; right upper belly pain; unusually weak or tired; yellowing of the eyes or skin Side effects that usually do not require medical attention (report to your doctor or health care professional if they continue or are bothersome): constipation mouth sores nausea, vomiting unusually weak or tired This list may not describe all possible side effects. Call your doctor for medical advice about side effects. You may report side effects to FDA at 1-800-FDA-1088. Where should I keep my medication? This drug is given in a hospital or  clinic and will not be stored at home. NOTE: This sheet is a summary. It may not cover all possible information. If you have questions about this medicine, talk to your doctor, pharmacist, or health care provider.  2022 Elsevier/Gold Standard (2017-04-21 00:00:00)

## 2021-01-28 DIAGNOSIS — C3490 Malignant neoplasm of unspecified part of unspecified bronchus or lung: Secondary | ICD-10-CM | POA: Diagnosis not present

## 2021-01-29 ENCOUNTER — Inpatient Hospital Stay: Payer: Medicare Other

## 2021-01-29 ENCOUNTER — Other Ambulatory Visit: Payer: Self-pay

## 2021-01-29 ENCOUNTER — Encounter (HOSPITAL_COMMUNITY): Payer: Self-pay

## 2021-01-29 DIAGNOSIS — C3491 Malignant neoplasm of unspecified part of right bronchus or lung: Secondary | ICD-10-CM

## 2021-01-29 DIAGNOSIS — Z5111 Encounter for antineoplastic chemotherapy: Secondary | ICD-10-CM | POA: Diagnosis not present

## 2021-01-29 DIAGNOSIS — Z23 Encounter for immunization: Secondary | ICD-10-CM | POA: Diagnosis not present

## 2021-01-29 DIAGNOSIS — C3431 Malignant neoplasm of lower lobe, right bronchus or lung: Secondary | ICD-10-CM | POA: Diagnosis not present

## 2021-01-29 DIAGNOSIS — Z79899 Other long term (current) drug therapy: Secondary | ICD-10-CM | POA: Diagnosis not present

## 2021-01-29 LAB — CMP (CANCER CENTER ONLY)
ALT: 30 U/L (ref 0–44)
AST: 24 U/L (ref 15–41)
Albumin: 4.1 g/dL (ref 3.5–5.0)
Alkaline Phosphatase: 70 U/L (ref 38–126)
Anion gap: 10 (ref 5–15)
BUN: 18 mg/dL (ref 8–23)
CO2: 22 mmol/L (ref 22–32)
Calcium: 9.3 mg/dL (ref 8.9–10.3)
Chloride: 105 mmol/L (ref 98–111)
Creatinine: 1.07 mg/dL (ref 0.61–1.24)
GFR, Estimated: >60 mL/min (ref >60)
Glucose, Bld: 189 mg/dL — ABNORMAL HIGH (ref 70–99)
Potassium: 4.3 mmol/L (ref 3.5–5.1)
Sodium: 137 mmol/L (ref 135–145)
Total Bilirubin: 0.4 mg/dL (ref 0.3–1.2)
Total Protein: 7.4 g/dL (ref 6.5–8.1)

## 2021-01-29 LAB — CBC WITH DIFFERENTIAL (CANCER CENTER ONLY)
Abs Immature Granulocytes: 0.02 10*3/uL (ref 0.00–0.07)
Basophils Absolute: 0.1 10*3/uL (ref 0.0–0.1)
Basophils Relative: 1 %
Eosinophils Absolute: 0.2 10*3/uL (ref 0.0–0.5)
Eosinophils Relative: 2 %
HCT: 44.1 % (ref 39.0–52.0)
Hemoglobin: 15.4 g/dL (ref 13.0–17.0)
Immature Granulocytes: 0 %
Lymphocytes Relative: 32 %
Lymphs Abs: 2.4 10*3/uL (ref 0.7–4.0)
MCH: 30.4 pg (ref 26.0–34.0)
MCHC: 34.9 g/dL (ref 30.0–36.0)
MCV: 87 fL (ref 80.0–100.0)
Monocytes Absolute: 0.2 10*3/uL (ref 0.1–1.0)
Monocytes Relative: 2 %
Neutro Abs: 4.7 10*3/uL (ref 1.7–7.7)
Neutrophils Relative %: 63 %
Platelet Count: 167 10*3/uL (ref 150–400)
RBC: 5.07 MIL/uL (ref 4.22–5.81)
RDW: 12.2 % (ref 11.5–15.5)
WBC Count: 7.5 10*3/uL (ref 4.0–10.5)
nRBC: 0 % (ref 0.0–0.2)

## 2021-02-06 ENCOUNTER — Other Ambulatory Visit: Payer: Self-pay

## 2021-02-06 ENCOUNTER — Inpatient Hospital Stay: Payer: Medicare Other

## 2021-02-06 ENCOUNTER — Inpatient Hospital Stay (HOSPITAL_BASED_OUTPATIENT_CLINIC_OR_DEPARTMENT_OTHER): Payer: Medicare Other | Admitting: Internal Medicine

## 2021-02-06 VITALS — BP 130/68 | HR 81 | Temp 96.9°F | Resp 20 | Ht 72.0 in | Wt 216.9 lb

## 2021-02-06 DIAGNOSIS — C3431 Malignant neoplasm of lower lobe, right bronchus or lung: Secondary | ICD-10-CM | POA: Diagnosis not present

## 2021-02-06 DIAGNOSIS — Z79899 Other long term (current) drug therapy: Secondary | ICD-10-CM | POA: Diagnosis not present

## 2021-02-06 DIAGNOSIS — C3491 Malignant neoplasm of unspecified part of right bronchus or lung: Secondary | ICD-10-CM

## 2021-02-06 DIAGNOSIS — Z5111 Encounter for antineoplastic chemotherapy: Secondary | ICD-10-CM

## 2021-02-06 DIAGNOSIS — Z5112 Encounter for antineoplastic immunotherapy: Secondary | ICD-10-CM

## 2021-02-06 DIAGNOSIS — Z23 Encounter for immunization: Secondary | ICD-10-CM | POA: Diagnosis not present

## 2021-02-06 LAB — CMP (CANCER CENTER ONLY)
ALT: 40 U/L (ref 0–44)
AST: 26 U/L (ref 15–41)
Albumin: 4.3 g/dL (ref 3.5–5.0)
Alkaline Phosphatase: 63 U/L (ref 38–126)
Anion gap: 9 (ref 5–15)
BUN: 14 mg/dL (ref 8–23)
CO2: 21 mmol/L — ABNORMAL LOW (ref 22–32)
Calcium: 8.9 mg/dL (ref 8.9–10.3)
Chloride: 105 mmol/L (ref 98–111)
Creatinine: 0.99 mg/dL (ref 0.61–1.24)
GFR, Estimated: >60 mL/min (ref >60)
Glucose, Bld: 193 mg/dL — ABNORMAL HIGH (ref 70–99)
Potassium: 3.9 mmol/L (ref 3.5–5.1)
Sodium: 135 mmol/L (ref 135–145)
Total Bilirubin: 0.4 mg/dL (ref 0.3–1.2)
Total Protein: 7.1 g/dL (ref 6.5–8.1)

## 2021-02-06 LAB — CBC WITH DIFFERENTIAL (CANCER CENTER ONLY)
Abs Immature Granulocytes: 0.01 10*3/uL (ref 0.00–0.07)
Basophils Absolute: 0 10*3/uL (ref 0.0–0.1)
Basophils Relative: 1 %
Eosinophils Absolute: 0.2 10*3/uL (ref 0.0–0.5)
Eosinophils Relative: 3 %
HCT: 40.1 % (ref 39.0–52.0)
Hemoglobin: 13.9 g/dL (ref 13.0–17.0)
Immature Granulocytes: 0 %
Lymphocytes Relative: 42 %
Lymphs Abs: 2.5 10*3/uL (ref 0.7–4.0)
MCH: 30.2 pg (ref 26.0–34.0)
MCHC: 34.7 g/dL (ref 30.0–36.0)
MCV: 87.2 fL (ref 80.0–100.0)
Monocytes Absolute: 0.6 10*3/uL (ref 0.1–1.0)
Monocytes Relative: 10 %
Neutro Abs: 2.7 10*3/uL (ref 1.7–7.7)
Neutrophils Relative %: 44 %
Platelet Count: 132 10*3/uL — ABNORMAL LOW (ref 150–400)
RBC: 4.6 MIL/uL (ref 4.22–5.81)
RDW: 12.1 % (ref 11.5–15.5)
WBC Count: 6 10*3/uL (ref 4.0–10.5)
nRBC: 0 % (ref 0.0–0.2)

## 2021-02-06 NOTE — Progress Notes (Signed)
[facility] [phone]   [phone]  OFFICE PROGRESS NOTE  [name], MD [place] [address]  DIAGNOSIS: Stage IIIA (T4, N0, M0) non-small cell lung cancer, favoring adenocarcinoma presented with large right lower lobe lung mass with no mediastinal lymphadenopathy or extrathoracic metastasis diagnosed in October 2022.  PRIOR THERAPY: None  CURRENT THERAPY: Neoadjuvant treatment with carboplatin for AUC of 5, Alimta 500 Mg/M2 and nivolumab 360 Mg IV every 3 weeks.  First dose January 23, 2021.  Status post 1 cycle.  INTERVAL HISTORY: Brandon Rubio. 70 y.o. male returns to the clinic today for follow-up visit.  The patient is feeling fine today with no concerning complaints.  He tolerated the first cycle of his treatment with carboplatin, Alimta and nivolumab fairly well.  He denied having any significant nausea, vomiting, diarrhea or constipation.  He has no chest pain, shortness of breath, cough or hemoptysis.  He has no headache or visual changes.  He is here today for evaluation and repeat blood work.  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  MEDICATIONS:  Current Outpatient Medications  Medication Sig Dispense Refill   aspirin EC 81 MG tablet Take 81 mg by mouth in the morning. Swallow whole.     empagliflozin (JARDIANCE) 25 MG TABS tablet Take 1 tablet (25 mg total) by mouth daily. 90 tablet 3   folic acid (FOLVITE) 1 MG tablet Take 1 tablet (1 mg total) by mouth daily. 30  tablet 4   glucose blood (FREESTYLE LITE) test strip USE TO TEST BLOOD SUGAR TWICE DAILY ICD E11.49 100 each 3   Melatonin 5 MG CHEW Chew 5 mg by mouth at bedtime.     metFORMIN (GLUCOPHAGE) 1000 MG tablet TAKE 1 TABLET BY MOUTH TWICE DAILY WITH A MEAL 180 tablet 1   metoprolol succinate (TOPROL-XL) 50 MG 24 hr tablet Take 1 tablet (50 mg total) by mouth daily. Take with or immediately following a meal. 90 tablet 1   Multiple Vitamin (MULTIVITAMIN WITH MINERALS) TABS tablet Take 1 tablet by mouth daily. Centrum Silver     nicotine (NICODERM CQ - DOSED IN MG/24 HOURS) 21 mg/24hr patch Place 1 patch (21 mg total) onto the skin daily. 28 patch 0   omeprazole (PRILOSEC) 20 MG capsule TAKE 1 CAPSULE(20 MG) BY MOUTH TWICE DAILY BEFORE A MEAL 180 capsule 3   prochlorperazine (COMPAZINE) 10 MG tablet Take 1 tablet (10 mg total) by mouth every 6 (six) hours as needed for nausea or vomiting. 30 tablet 0   simvastatin (ZOCOR) 40 MG tablet Take 1 tablet (40 mg total) by mouth daily at 6 PM. 90 tablet 3   traZODone (DESYREL) 50 MG tablet Take 1-2 tablets (50-100 mg total) by mouth at bedtime as needed for sleep. (Patient taking differently: Take 50-100 mg by mouth at bedtime as needed for sleep. Pt states taking two tablets) 180 tablet 0   triamcinolone cream (KENALOG)  0.1 % Apply 1 application topically 2 (two) times daily as needed (poison ivy).     No current facility-administered medications for this visit.    SURGICAL HISTORY:  Past Surgical History:  Procedure Laterality Date   BRONCHIAL BIOPSY  12/10/2020   Procedure: BRONCHIAL BIOPSIES;  Surgeon: Collene Gobble, MD;  Location: [facility] ENDOSCOPY;  Service: Pulmonary;;   BRONCHIAL BRUSHINGS  12/10/2020   Procedure: BRONCHIAL BRUSHINGS;  Surgeon: Collene Gobble, MD;  Location: [facility] ENDOSCOPY;  Service: Pulmonary;;   BRONCHIAL NEEDLE ASPIRATION BIOPSY  12/10/2020   Procedure: BRONCHIAL NEEDLE ASPIRATION BIOPSIES;  Surgeon: Collene Gobble, MD;  Location:  [facility] ENDOSCOPY;  Service: Pulmonary;;   COLONOSCOPY     COLONOSCOPY W/ POLYPECTOMY  03/10/2009   LARYNGOSCOPY  03/10/1997   VIDEO BRONCHOSCOPY WITH ENDOBRONCHIAL NAVIGATION N/A 12/10/2020   Procedure: ROBOTIC [place];  Surgeon: Collene Gobble, MD;  Location: [facility] ENDOSCOPY;  Service: Pulmonary;  Laterality: N/A;    REVIEW OF SYSTEMS:  A comprehensive review of systems was negative.   PHYSICAL EXAMINATION: General appearance: alert, cooperative, and no distress Head: Normocephalic, without obvious abnormality, atraumatic Neck: no adenopathy, no JVD, supple, symmetrical, trachea midline, and thyroid not enlarged, symmetric, no tenderness/mass/nodules Lymph nodes: Cervical, supraclavicular, and axillary nodes normal. Resp: clear to auscultation bilaterally Back: symmetric, no curvature. ROM normal. No CVA tenderness. Cardio: regular rate and rhythm, S1, S2 normal, no murmur, click, rub or gallop GI: soft, non-tender; bowel sounds normal; no masses,  no organomegaly Extremities: extremities normal, atraumatic, no cyanosis or edema  ECOG PERFORMANCE STATUS: 1 - Symptomatic but completely ambulatory  Blood pressure 130/68, pulse 81, temperature (!) 96.9 F (36.1 C), temperature source Tympanic, resp. rate 20, height 6' (1.829 m), weight 216 lb 14.4 oz (98.4 kg), SpO2 96 %.  LABORATORY DATA: Lab Results  Component Value Date   WBC 6.0 02/06/2021   HGB 13.9 02/06/2021   HCT 40.1 02/06/2021   MCV 87.2 02/06/2021   PLT 132 (L) 02/06/2021      Chemistry      Component Value Date/Time   NA 137 01/29/2021 0757   K 4.3 01/29/2021 0757   CL 105 01/29/2021 0757   CO2 22 01/29/2021 0757   BUN 18 01/29/2021 0757   CREATININE 1.07 01/29/2021 0757      Component Value Date/Time   CALCIUM 9.3 01/29/2021 0757   ALKPHOS 70 01/29/2021 0757   AST 24 01/29/2021 0757   ALT 30 01/29/2021 0757   BILITOT 0.4 01/29/2021 0757       RADIOGRAPHIC  STUDIES: No results found.  ASSESSMENT AND PLAN:  This is a very pleasant 70 years old white male recently diagnosed with a stage IIIa (T4, N0, M0) non-small cell lung cancer, adenocarcinoma presented with large right lower lobe lung mass with no mediastinal lymphadenopathy or extra thoracic metastasis diagnosed in October 2022 The patient is currently undergoing neoadjuvant systemic chemotherapy with carboplatin for AUC of 5, Alimta 500 Mg/M2 and nivolumab 360 Mg IV every 3 weeks status post 1 cycle.  He tolerated the first cycle of his treatment fairly well with no concerning adverse effects. I recommended for the patient to proceed with cycle #2 next week as planned. I will see him back for follow-up visit in 1 week with the start of the next cycle of his treatment. He was advised to call immediately if he has any other concerning symptoms in the interval. The patient voices understanding of current disease status and treatment  options and is in agreement with the current care plan.  All questions were answered. The patient knows to call the clinic with any problems, questions or concerns. We can certainly see the patient much sooner if necessary.  The total time spent in the appointment was 40 minutes.  Disclaimer: This note was dictated with voice recognition software. Similar sounding words can inadvertently be transcribed and may not be corrected upon review.

## 2021-02-14 ENCOUNTER — Other Ambulatory Visit: Payer: Self-pay

## 2021-02-14 ENCOUNTER — Inpatient Hospital Stay: Payer: Medicare Other | Attending: Internal Medicine

## 2021-02-14 ENCOUNTER — Inpatient Hospital Stay: Payer: Medicare Other

## 2021-02-14 ENCOUNTER — Encounter: Payer: Self-pay | Admitting: Internal Medicine

## 2021-02-14 ENCOUNTER — Inpatient Hospital Stay (HOSPITAL_BASED_OUTPATIENT_CLINIC_OR_DEPARTMENT_OTHER): Payer: Medicare Other | Admitting: Internal Medicine

## 2021-02-14 VITALS — BP 123/80 | HR 85 | Temp 97.2°F | Resp 19 | Ht 72.0 in | Wt 217.5 lb

## 2021-02-14 DIAGNOSIS — C3491 Malignant neoplasm of unspecified part of right bronchus or lung: Secondary | ICD-10-CM | POA: Diagnosis not present

## 2021-02-14 DIAGNOSIS — Z5111 Encounter for antineoplastic chemotherapy: Secondary | ICD-10-CM

## 2021-02-14 DIAGNOSIS — Z79899 Other long term (current) drug therapy: Secondary | ICD-10-CM | POA: Diagnosis not present

## 2021-02-14 DIAGNOSIS — C3431 Malignant neoplasm of lower lobe, right bronchus or lung: Secondary | ICD-10-CM | POA: Diagnosis not present

## 2021-02-14 DIAGNOSIS — R5382 Chronic fatigue, unspecified: Secondary | ICD-10-CM

## 2021-02-14 DIAGNOSIS — Z5112 Encounter for antineoplastic immunotherapy: Secondary | ICD-10-CM

## 2021-02-14 LAB — CMP (CANCER CENTER ONLY)
ALT: 34 U/L (ref 0–44)
AST: 20 U/L (ref 15–41)
Albumin: 4 g/dL (ref 3.5–5.0)
Alkaline Phosphatase: 66 U/L (ref 38–126)
Anion gap: 12 (ref 5–15)
BUN: 15 mg/dL (ref 8–23)
CO2: 20 mmol/L — ABNORMAL LOW (ref 22–32)
Calcium: 8.9 mg/dL (ref 8.9–10.3)
Chloride: 107 mmol/L (ref 98–111)
Creatinine: 0.9 mg/dL (ref 0.61–1.24)
GFR, Estimated: >60 mL/min (ref >60)
Glucose, Bld: 194 mg/dL — ABNORMAL HIGH (ref 70–99)
Potassium: 3.9 mmol/L (ref 3.5–5.1)
Sodium: 139 mmol/L (ref 135–145)
Total Bilirubin: 0.4 mg/dL (ref 0.3–1.2)
Total Protein: 7.1 g/dL (ref 6.5–8.1)

## 2021-02-14 LAB — CBC WITH DIFFERENTIAL (CANCER CENTER ONLY)
Abs Immature Granulocytes: 0.03 10*3/uL (ref 0.00–0.07)
Basophils Absolute: 0.1 10*3/uL (ref 0.0–0.1)
Basophils Relative: 1 %
Eosinophils Absolute: 0.1 10*3/uL (ref 0.0–0.5)
Eosinophils Relative: 2 %
HCT: 42 % (ref 39.0–52.0)
Hemoglobin: 14.4 g/dL (ref 13.0–17.0)
Immature Granulocytes: 1 %
Lymphocytes Relative: 36 %
Lymphs Abs: 2 10*3/uL (ref 0.7–4.0)
MCH: 30.2 pg (ref 26.0–34.0)
MCHC: 34.3 g/dL (ref 30.0–36.0)
MCV: 88.1 fL (ref 80.0–100.0)
Monocytes Absolute: 0.6 10*3/uL (ref 0.1–1.0)
Monocytes Relative: 10 %
Neutro Abs: 2.8 10*3/uL (ref 1.7–7.7)
Neutrophils Relative %: 50 %
Platelet Count: 160 10*3/uL (ref 150–400)
RBC: 4.77 MIL/uL (ref 4.22–5.81)
RDW: 12.9 % (ref 11.5–15.5)
WBC Count: 5.5 10*3/uL (ref 4.0–10.5)
nRBC: 0 % (ref 0.0–0.2)

## 2021-02-14 LAB — TSH: TSH: 1.17 u[IU]/mL (ref 0.320–4.118)

## 2021-02-14 MED ORDER — SODIUM CHLORIDE 0.9 % IV SOLN
554.5000 mg | Freq: Once | INTRAVENOUS | Status: AC
  Administered 2021-02-14: 550 mg via INTRAVENOUS
  Filled 2021-02-14: qty 55

## 2021-02-14 MED ORDER — SODIUM CHLORIDE 0.9 % IV SOLN: Freq: Once | INTRAVENOUS | Status: AC

## 2021-02-14 MED ORDER — SODIUM CHLORIDE 0.9 % IV SOLN
10.0000 mg | Freq: Once | INTRAVENOUS | Status: AC
  Administered 2021-02-14: 10 mg via INTRAVENOUS
  Filled 2021-02-14: qty 10

## 2021-02-14 MED ORDER — SODIUM CHLORIDE 0.9 % IV SOLN
150.0000 mg | Freq: Once | INTRAVENOUS | Status: AC
  Administered 2021-02-14: 150 mg via INTRAVENOUS
  Filled 2021-02-14: qty 150

## 2021-02-14 MED ORDER — SODIUM CHLORIDE 0.9 % IV SOLN
360.0000 mg | Freq: Once | INTRAVENOUS | Status: AC
  Administered 2021-02-14: 360 mg via INTRAVENOUS
  Filled 2021-02-14: qty 24

## 2021-02-14 MED ORDER — PALONOSETRON HCL INJECTION 0.25 MG/5ML
0.2500 mg | Freq: Once | INTRAVENOUS | Status: AC
  Administered 2021-02-14: 0.25 mg via INTRAVENOUS
  Filled 2021-02-14: qty 5

## 2021-02-14 MED ORDER — SODIUM CHLORIDE 0.9 % IV SOLN
500.0000 mg/m2 | Freq: Once | INTRAVENOUS | Status: AC
  Administered 2021-02-14: 1100 mg via INTRAVENOUS
  Filled 2021-02-14: qty 40

## 2021-02-14 NOTE — Progress Notes (Signed)
[facility] [phone]   [phone]  OFFICE PROGRESS NOTE  [name], MD [place] [address]  DIAGNOSIS: Stage IIIA (T4, N0, M0) non-small cell lung cancer, favoring adenocarcinoma presented with large right lower lobe lung mass with no mediastinal lymphadenopathy or extrathoracic metastasis diagnosed in October 2022.  PRIOR THERAPY: None  CURRENT THERAPY: Neoadjuvant treatment with carboplatin for AUC of 5, Alimta 500 Mg/M2 and nivolumab 360 Mg IV every 3 weeks.  First dose January 23, 2021.  Status post 1 cycle.  INTERVAL HISTORY: Brandon Rubio. 70 y.o. male returns to the clinic today for follow-up visit.  The patient tolerated the first cycle of his treatment fairly well except for fatigue that lasted for around 10 days.  He denied having any current chest pain, shortness of breath, cough or hemoptysis.  He denied having any fever or chills.  He has no nausea, vomiting, diarrhea or constipation.  He has no headache or visual changes.  He has no recent weight loss or night sweats.  He is here today for evaluation before starting cycle #2 of his treatment.  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  MEDICATIONS:  Current Outpatient Medications  Medication Sig Dispense Refill   aspirin EC 81 MG tablet Take 81 mg by mouth in the morning. Swallow whole.     empagliflozin (JARDIANCE) 25 MG TABS tablet Take 1 tablet (25 mg total) by mouth daily. 90 tablet 3   folic acid (FOLVITE) 1 MG  tablet Take 1 tablet (1 mg total) by mouth daily. 30 tablet 4   glucose blood (FREESTYLE LITE) test strip USE TO TEST BLOOD SUGAR TWICE DAILY ICD E11.49 100 each 3   Melatonin 5 MG CHEW Chew 5 mg by mouth at bedtime.     metFORMIN (GLUCOPHAGE) 1000 MG tablet TAKE 1 TABLET BY MOUTH TWICE DAILY WITH A MEAL 180 tablet 1   metoprolol succinate (TOPROL-XL) 50 MG 24 hr tablet Take 1 tablet (50 mg total) by mouth daily. Take with or immediately following a meal. 90 tablet 1   Multiple Vitamin (MULTIVITAMIN WITH MINERALS) TABS tablet Take 1 tablet by mouth daily. Centrum Silver     nicotine (NICODERM CQ - DOSED IN MG/24 HOURS) 21 mg/24hr patch Place 1 patch (21 mg total) onto the skin daily. 28 patch 0   omeprazole (PRILOSEC) 20 MG capsule TAKE 1 CAPSULE(20 MG) BY MOUTH TWICE DAILY BEFORE A MEAL 180 capsule 3   prochlorperazine (COMPAZINE) 10 MG tablet Take 1 tablet (10 mg total) by mouth every 6 (six) hours as needed for nausea or vomiting. 30 tablet 0   simvastatin (ZOCOR) 40 MG tablet Take 1 tablet (40 mg total) by mouth daily at 6 PM. 90 tablet 3   traZODone (DESYREL) 50 MG tablet Take 1-2 tablets (50-100 mg total) by mouth at bedtime as needed for sleep. (Patient taking differently: Take 50-100 mg by mouth at bedtime as needed  for sleep. Pt states taking two tablets) 180 tablet 0   triamcinolone cream (KENALOG) 0.1 % Apply 1 application topically 2 (two) times daily as needed (poison ivy).     No current facility-administered medications for this visit.    SURGICAL HISTORY:  Past Surgical History:  Procedure Laterality Date   BRONCHIAL BIOPSY  12/10/2020   Procedure: BRONCHIAL BIOPSIES;  Surgeon: Collene Gobble, MD;  Location[facility] ENDOSCOPY;  Service: Pulmonary;;   BRONCHIAL BRUSHINGS  12/10/2020   Procedure: BRONCHIAL BRUSHINGS;  Surgeon: Collene Gobble, MD;  Location: [facility] ENDOSCOPY;  Service: Pulmonary;;   BRONCHIAL NEEDLE ASPIRATION BIOPSY  12/10/2020   Procedure: BRONCHIAL NEEDLE ASPIRATION  BIOPSIES;  Surgeon: Collene Gobble, MD;  Location: [facility] ENDOSCOPY;  Service: Pulmonary;;   COLONOSCOPY     COLONOSCOPY W/ POLYPECTOMY  03/10/2009   LARYNGOSCOPY  03/10/1997   VIDEO BRONCHOSCOPY WITH ENDOBRONCHIAL NAVIGATION N/A 12/10/2020   Procedure: ROBOTIC [place];  Surgeon: Collene Gobble, MD;  Location: [facility] ENDOSCOPY;  Service: Pulmonary;  Laterality: N/A;    REVIEW OF SYSTEMS:  A comprehensive review of systems was negative except for: Constitutional: positive for fatigue   PHYSICAL EXAMINATION: General appearance: alert, cooperative, and no distress Head: Normocephalic, without obvious abnormality, atraumatic Neck: no adenopathy, no JVD, supple, symmetrical, trachea midline, and thyroid not enlarged, symmetric, no tenderness/mass/nodules Lymph nodes: Cervical, supraclavicular, and axillary nodes normal. Resp: clear to auscultation bilaterally Back: symmetric, no curvature. ROM normal. No CVA tenderness. Cardio: regular rate and rhythm, S1, S2 normal, no murmur, click, rub or gallop GI: soft, non-tender; bowel sounds normal; no masses,  no organomegaly Extremities: extremities normal, atraumatic, no cyanosis or edema  ECOG PERFORMANCE STATUS: 1 - Symptomatic but completely ambulatory  Blood pressure 123/80, pulse 85, temperature (!) 97.2 F (36.2 C), temperature source Tympanic, resp. rate 19, height 6' (1.829 m), weight 217 lb 8 oz (98.7 kg), SpO2 95 %.  LABORATORY DATA: Lab Results  Component Value Date   WBC 5.5 02/14/2021   HGB 14.4 02/14/2021   HCT 42.0 02/14/2021   MCV 88.1 02/14/2021   PLT 160 02/14/2021      Chemistry      Component Value Date/Time   NA 135 02/06/2021 0759   K 3.9 02/06/2021 0759   CL 105 02/06/2021 0759   CO2 21 (L) 02/06/2021 0759   BUN 14 02/06/2021 0759   CREATININE 0.99 02/06/2021 0759      Component Value Date/Time   CALCIUM 8.9 02/06/2021 0759   ALKPHOS 63 02/06/2021 0759   AST 26 02/06/2021  0759   ALT 40 02/06/2021 0759   BILITOT 0.4 02/06/2021 0759       RADIOGRAPHIC STUDIES: No results found.  ASSESSMENT AND PLAN:  This is a very pleasant 70 years old white male recently diagnosed with a stage IIIa (T4, N0, M0) non-small cell lung cancer, adenocarcinoma presented with large right lower lobe lung mass with no mediastinal lymphadenopathy or extra thoracic metastasis diagnosed in October 2022 The patient is currently undergoing neoadjuvant systemic chemotherapy with carboplatin for AUC of 5, Alimta 500 Mg/M2 and nivolumab 360 Mg IV every 3 weeks status post 1 cycle.   He tolerated the first cycle of his treatment well except for fatigue that lasted for around 10 days. Is feeling much better today.  I recommended for him to proceed with cycle #2 today as planned. I will see him back for follow-up visit in 3 weeks for evaluation before the last cycle of  his neoadjuvant systemic chemotherapy. He will have repeat imaging studies in around 6 weeks for restaging of his disease before referral to thoracic surgery for consideration of surgical resection. The patient was advised to call immediately if he has any other concerning symptoms in the interval. The patient voices understanding of current disease status and treatment options and is in agreement with the current care plan.  All questions were answered. The patient knows to call the clinic with any problems, questions or concerns. We can certainly see the patient much sooner if necessary.  The total time spent in the appointment was 40 minutes.  Disclaimer: This note was dictated with voice recognition software. Similar sounding words can inadvertently be transcribed and may not be corrected upon review.

## 2021-02-14 NOTE — Patient Instructions (Signed)
[facility] ONCOLOGY  Discharge Instructions: Thank you for choosing [facility] to provide your oncology and hematology care.   If you have a lab appointment with the [facility], please go directly to the [facility] and check in at the registration area.   Wear comfortable clothing and clothing appropriate for easy access to any Portacath or PICC line.   We strive to give you quality time with your provider. You may need to reschedule your appointment if you arrive late (15 or more minutes).  Arriving late affects you and other patients whose appointments are after yours.  Also, if you miss three or more appointments without notifying the office, you may be dismissed from the clinic at the provider's discretion.      For prescription refill requests, have your pharmacy contact our office and allow 72 hours for refills to be completed.    Today you received the following chemotherapy and/or immunotherapy agents: Nivolumab (Opdivo), Pemetrexed (Alimta), and Carboplatin.   To help prevent nausea and vomiting after your treatment, we encourage you to take your nausea medication as directed.  BELOW ARE SYMPTOMS THAT SHOULD BE REPORTED IMMEDIATELY: *FEVER GREATER THAN 100.4 F (38 C) OR HIGHER *CHILLS OR SWEATING *NAUSEA AND VOMITING THAT IS NOT CONTROLLED WITH YOUR NAUSEA MEDICATION *UNUSUAL SHORTNESS OF BREATH *UNUSUAL BRUISING OR BLEEDING *URINARY PROBLEMS (pain or burning when urinating, or frequent urination) *BOWEL PROBLEMS (unusual diarrhea, constipation, pain near the anus) TENDERNESS IN MOUTH AND THROAT WITH OR WITHOUT PRESENCE OF ULCERS (sore throat, sores in mouth, or a toothache) UNUSUAL RASH, SWELLING OR PAIN  UNUSUAL VAGINAL DISCHARGE OR ITCHING   Items with * indicate a potential emergency and should be followed up as soon as possible or go to the Emergency Department if any problems should occur.  Please show the CHEMOTHERAPY ALERT  CARD or IMMUNOTHERAPY ALERT CARD at check-in to the Emergency Department and triage nurse.  Should you have questions after your visit or need to cancel or reschedule your appointment, please contact [facility]  Dept: [phone]  and follow the prompts.  Office hours are 8:00 a.m. to 4:30 p.m. Monday - Friday. Please note that voicemails left after 4:00 p.m. may not be returned until the following business day.  We are closed weekends and major holidays. You have access to a nurse at all times for urgent questions. Please call the main number to the clinic Dept: [phone] and follow the prompts.   For any non-urgent questions, you may also contact your provider using MyChart. We now offer e-Visits for anyone 72 and older to request care online for non-urgent symptoms. For details visit [email or web address].   Also download the MyChart app! Go to the app store, search "MyChart", open the app, select [facility], and log in with your MyChart username and password.  Due to Covid, a mask is required upon entering the hospital/clinic. If you do not have a mask, one will be given to you upon arrival. For doctor visits, patients may have 1 support person aged 72 or older with them. For treatment visits, patients cannot have anyone with them due to current Covid guidelines and our immunocompromised population.

## 2021-02-20 ENCOUNTER — Other Ambulatory Visit: Payer: Self-pay

## 2021-02-20 ENCOUNTER — Inpatient Hospital Stay: Payer: Medicare Other

## 2021-02-20 DIAGNOSIS — C3491 Malignant neoplasm of unspecified part of right bronchus or lung: Secondary | ICD-10-CM

## 2021-02-20 DIAGNOSIS — C3431 Malignant neoplasm of lower lobe, right bronchus or lung: Secondary | ICD-10-CM | POA: Diagnosis not present

## 2021-02-20 DIAGNOSIS — Z5111 Encounter for antineoplastic chemotherapy: Secondary | ICD-10-CM | POA: Diagnosis not present

## 2021-02-20 DIAGNOSIS — Z79899 Other long term (current) drug therapy: Secondary | ICD-10-CM | POA: Diagnosis not present

## 2021-02-20 LAB — CBC WITH DIFFERENTIAL (CANCER CENTER ONLY)
Abs Immature Granulocytes: 0.01 10*3/uL (ref 0.00–0.07)
Basophils Absolute: 0.1 10*3/uL (ref 0.0–0.1)
Basophils Relative: 1 %
Eosinophils Absolute: 0.1 10*3/uL (ref 0.0–0.5)
Eosinophils Relative: 2 %
HCT: 42.2 % (ref 39.0–52.0)
Hemoglobin: 14.8 g/dL (ref 13.0–17.0)
Immature Granulocytes: 0 %
Lymphocytes Relative: 46 %
Lymphs Abs: 2.2 10*3/uL (ref 0.7–4.0)
MCH: 30.5 pg (ref 26.0–34.0)
MCHC: 35.1 g/dL (ref 30.0–36.0)
MCV: 86.8 fL (ref 80.0–100.0)
Monocytes Absolute: 0.2 10*3/uL (ref 0.1–1.0)
Monocytes Relative: 3 %
Neutro Abs: 2.3 10*3/uL (ref 1.7–7.7)
Neutrophils Relative %: 48 %
Platelet Count: 98 10*3/uL — ABNORMAL LOW (ref 150–400)
RBC: 4.86 MIL/uL (ref 4.22–5.81)
RDW: 12.7 % (ref 11.5–15.5)
WBC Count: 4.7 10*3/uL (ref 4.0–10.5)
nRBC: 0 % (ref 0.0–0.2)

## 2021-02-20 LAB — CMP (CANCER CENTER ONLY)
ALT: 62 U/L — ABNORMAL HIGH (ref 0–44)
AST: 29 U/L (ref 15–41)
Albumin: 4.2 g/dL (ref 3.5–5.0)
Alkaline Phosphatase: 73 U/L (ref 38–126)
Anion gap: 12 (ref 5–15)
BUN: 22 mg/dL (ref 8–23)
CO2: 20 mmol/L — ABNORMAL LOW (ref 22–32)
Calcium: 9.3 mg/dL (ref 8.9–10.3)
Chloride: 104 mmol/L (ref 98–111)
Creatinine: 1.01 mg/dL (ref 0.61–1.24)
GFR, Estimated: >60 mL/min (ref >60)
Glucose, Bld: 225 mg/dL — ABNORMAL HIGH (ref 70–99)
Potassium: 4.2 mmol/L (ref 3.5–5.1)
Sodium: 136 mmol/L (ref 135–145)
Total Bilirubin: 0.6 mg/dL (ref 0.3–1.2)
Total Protein: 7.5 g/dL (ref 6.5–8.1)

## 2021-02-21 ENCOUNTER — Other Ambulatory Visit: Payer: Self-pay | Admitting: Medical Oncology

## 2021-02-21 DIAGNOSIS — C3491 Malignant neoplasm of unspecified part of right bronchus or lung: Secondary | ICD-10-CM

## 2021-02-21 DIAGNOSIS — R110 Nausea: Secondary | ICD-10-CM

## 2021-02-21 MED ORDER — PROCHLORPERAZINE MALEATE 10 MG PO TABS: 10.0000 mg | ORAL_TABLET | Freq: Four times a day (QID) | ORAL | 0 refills | Status: DC | PRN

## 2021-02-21 NOTE — Telephone Encounter (Signed)
Requests refill compazine. Done.

## 2021-02-26 ENCOUNTER — Other Ambulatory Visit: Payer: Self-pay

## 2021-02-26 ENCOUNTER — Inpatient Hospital Stay: Payer: Medicare Other

## 2021-02-26 DIAGNOSIS — C3491 Malignant neoplasm of unspecified part of right bronchus or lung: Secondary | ICD-10-CM

## 2021-02-26 DIAGNOSIS — Z79899 Other long term (current) drug therapy: Secondary | ICD-10-CM | POA: Diagnosis not present

## 2021-02-26 DIAGNOSIS — C3431 Malignant neoplasm of lower lobe, right bronchus or lung: Secondary | ICD-10-CM | POA: Diagnosis not present

## 2021-02-26 DIAGNOSIS — Z5111 Encounter for antineoplastic chemotherapy: Secondary | ICD-10-CM | POA: Diagnosis not present

## 2021-02-26 LAB — CBC WITH DIFFERENTIAL (CANCER CENTER ONLY)
Abs Immature Granulocytes: 0.01 10*3/uL (ref 0.00–0.07)
Basophils Absolute: 0 10*3/uL (ref 0.0–0.1)
Basophils Relative: 0 %
Eosinophils Absolute: 0.1 10*3/uL (ref 0.0–0.5)
Eosinophils Relative: 1 %
HCT: 36.5 % — ABNORMAL LOW (ref 39.0–52.0)
Hemoglobin: 12.7 g/dL — ABNORMAL LOW (ref 13.0–17.0)
Immature Granulocytes: 0 %
Lymphocytes Relative: 62 %
Lymphs Abs: 2.2 10*3/uL (ref 0.7–4.0)
MCH: 30.8 pg (ref 26.0–34.0)
MCHC: 34.8 g/dL (ref 30.0–36.0)
MCV: 88.6 fL (ref 80.0–100.0)
Monocytes Absolute: 0.4 10*3/uL (ref 0.1–1.0)
Monocytes Relative: 10 %
Neutro Abs: 1 10*3/uL — ABNORMAL LOW (ref 1.7–7.7)
Neutrophils Relative %: 27 %
Platelet Count: 62 10*3/uL — ABNORMAL LOW (ref 150–400)
RBC: 4.12 MIL/uL — ABNORMAL LOW (ref 4.22–5.81)
RDW: 12.6 % (ref 11.5–15.5)
WBC Count: 3.6 10*3/uL — ABNORMAL LOW (ref 4.0–10.5)
nRBC: 0 % (ref 0.0–0.2)

## 2021-02-26 LAB — CMP (CANCER CENTER ONLY)
ALT: 48 U/L — ABNORMAL HIGH (ref 0–44)
AST: 27 U/L (ref 15–41)
Albumin: 4.1 g/dL (ref 3.5–5.0)
Alkaline Phosphatase: 71 U/L (ref 38–126)
Anion gap: 10 (ref 5–15)
BUN: 14 mg/dL (ref 8–23)
CO2: 25 mmol/L (ref 22–32)
Calcium: 9.1 mg/dL (ref 8.9–10.3)
Chloride: 103 mmol/L (ref 98–111)
Creatinine: 1.13 mg/dL (ref 0.61–1.24)
GFR, Estimated: >60 mL/min (ref >60)
Glucose, Bld: 280 mg/dL — ABNORMAL HIGH (ref 70–99)
Potassium: 4.5 mmol/L (ref 3.5–5.1)
Sodium: 138 mmol/L (ref 135–145)
Total Bilirubin: 0.4 mg/dL (ref 0.3–1.2)
Total Protein: 6.7 g/dL (ref 6.5–8.1)

## 2021-03-05 ENCOUNTER — Telehealth: Payer: Self-pay

## 2021-03-05 ENCOUNTER — Other Ambulatory Visit: Payer: Self-pay | Admitting: Internal Medicine

## 2021-03-05 NOTE — Telephone Encounter (Signed)
Per scheduling message, pt dx with COVID 03/05/21 and has infusion 03/07/21.  Per Dr. Julien Nordmann, r/s pt to 03/18/21 and schedule ALL services in an infusion bed with COVID protocol (pt to call when outside the [facility]). Scheduling has been notified of this.

## 2021-03-06 ENCOUNTER — Telehealth: Payer: Self-pay | Admitting: Medical Oncology

## 2021-03-06 NOTE — Telephone Encounter (Addendum)
Fever /chills/cough /weak/sleeps all the time.  " I am pretty sure I have COVID or the Flu".  " I know I have a fever b/c my forehead is hot , I have chills.". He states he is having trouble walking because he is weak.    He said he has not tested for COVID or flu recently.  12/07/20-Covid negative.([facility] lab).  01/16/21-Flu vaccine documented as given   02/21/20-3rd Covid vaccine documented   I instructed pt that his condition may be fatal. I instructed him to  go to an ED -[facility] or [facility] or call EMS.  "Let me just see how I do today".   Sister , Madaline Savage contacted. She can help him get to ED. I called pt back and he wants to hold off on going to ED. I told him his sister is aware of my concern.

## 2021-03-07 ENCOUNTER — Other Ambulatory Visit: Payer: Self-pay

## 2021-03-07 ENCOUNTER — Encounter (HOSPITAL_BASED_OUTPATIENT_CLINIC_OR_DEPARTMENT_OTHER): Payer: Self-pay | Admitting: *Deleted

## 2021-03-07 ENCOUNTER — Inpatient Hospital Stay: Payer: Medicare Other

## 2021-03-07 ENCOUNTER — Telehealth: Payer: Self-pay | Admitting: Medical Oncology

## 2021-03-07 ENCOUNTER — Inpatient Hospital Stay (HOSPITAL_BASED_OUTPATIENT_CLINIC_OR_DEPARTMENT_OTHER)
Admission: EM | Admit: 2021-03-07 | Discharge: 2021-03-09 | DRG: 638 | Disposition: A | Discharge status: Home / Self Care | Payer: Medicare Other | Attending: Internal Medicine | Admitting: Internal Medicine

## 2021-03-07 ENCOUNTER — Emergency Department (HOSPITAL_BASED_OUTPATIENT_CLINIC_OR_DEPARTMENT_OTHER): Payer: Medicare Other | Admitting: Radiology

## 2021-03-07 ENCOUNTER — Emergency Department (HOSPITAL_BASED_OUTPATIENT_CLINIC_OR_DEPARTMENT_OTHER): Payer: Medicare Other

## 2021-03-07 DIAGNOSIS — R918 Other nonspecific abnormal finding of lung field: Secondary | ICD-10-CM | POA: Diagnosis not present

## 2021-03-07 DIAGNOSIS — Z86718 Personal history of other venous thrombosis and embolism: Secondary | ICD-10-CM | POA: Diagnosis not present

## 2021-03-07 DIAGNOSIS — E860 Dehydration: Secondary | ICD-10-CM | POA: Diagnosis present

## 2021-03-07 DIAGNOSIS — E1110 Type 2 diabetes mellitus with ketoacidosis without coma: Secondary | ICD-10-CM | POA: Diagnosis not present

## 2021-03-07 DIAGNOSIS — E1165 Type 2 diabetes mellitus with hyperglycemia: Secondary | ICD-10-CM | POA: Diagnosis present

## 2021-03-07 DIAGNOSIS — I10 Essential (primary) hypertension: Secondary | ICD-10-CM | POA: Diagnosis not present

## 2021-03-07 DIAGNOSIS — E785 Hyperlipidemia, unspecified: Secondary | ICD-10-CM | POA: Diagnosis present

## 2021-03-07 DIAGNOSIS — Z8371 Family history of colonic polyps: Secondary | ICD-10-CM | POA: Diagnosis not present

## 2021-03-07 DIAGNOSIS — Z79899 Other long term (current) drug therapy: Secondary | ICD-10-CM

## 2021-03-07 DIAGNOSIS — E119 Type 2 diabetes mellitus without complications: Secondary | ICD-10-CM | POA: Diagnosis present

## 2021-03-07 DIAGNOSIS — I252 Old myocardial infarction: Secondary | ICD-10-CM

## 2021-03-07 DIAGNOSIS — F17210 Nicotine dependence, cigarettes, uncomplicated: Secondary | ICD-10-CM | POA: Diagnosis present

## 2021-03-07 DIAGNOSIS — Z8249 Family history of ischemic heart disease and other diseases of the circulatory system: Secondary | ICD-10-CM | POA: Diagnosis not present

## 2021-03-07 DIAGNOSIS — Z880 Allergy status to penicillin: Secondary | ICD-10-CM | POA: Diagnosis not present

## 2021-03-07 DIAGNOSIS — E118 Type 2 diabetes mellitus with unspecified complications: Secondary | ICD-10-CM | POA: Diagnosis present

## 2021-03-07 DIAGNOSIS — Z9221 Personal history of antineoplastic chemotherapy: Secondary | ICD-10-CM | POA: Diagnosis not present

## 2021-03-07 DIAGNOSIS — R0602 Shortness of breath: Secondary | ICD-10-CM | POA: Diagnosis not present

## 2021-03-07 DIAGNOSIS — Z7984 Long term (current) use of oral hypoglycemic drugs: Secondary | ICD-10-CM

## 2021-03-07 DIAGNOSIS — K219 Gastro-esophageal reflux disease without esophagitis: Secondary | ICD-10-CM | POA: Diagnosis present

## 2021-03-07 DIAGNOSIS — E1010 Type 1 diabetes mellitus with ketoacidosis without coma: Principal | ICD-10-CM

## 2021-03-07 DIAGNOSIS — Z20822 Contact with and (suspected) exposure to covid-19: Secondary | ICD-10-CM | POA: Diagnosis present

## 2021-03-07 DIAGNOSIS — R059 Cough, unspecified: Secondary | ICD-10-CM | POA: Diagnosis not present

## 2021-03-07 DIAGNOSIS — Z923 Personal history of irradiation: Secondary | ICD-10-CM | POA: Diagnosis not present

## 2021-03-07 DIAGNOSIS — R0609 Other forms of dyspnea: Secondary | ICD-10-CM | POA: Diagnosis present

## 2021-03-07 DIAGNOSIS — A419 Sepsis, unspecified organism: Secondary | ICD-10-CM | POA: Diagnosis not present

## 2021-03-07 DIAGNOSIS — I2510 Atherosclerotic heart disease of native coronary artery without angina pectoris: Secondary | ICD-10-CM | POA: Diagnosis present

## 2021-03-07 DIAGNOSIS — Z7982 Long term (current) use of aspirin: Secondary | ICD-10-CM

## 2021-03-07 DIAGNOSIS — Z800 Family history of malignant neoplasm of digestive organs: Secondary | ICD-10-CM | POA: Diagnosis not present

## 2021-03-07 DIAGNOSIS — R911 Solitary pulmonary nodule: Secondary | ICD-10-CM | POA: Diagnosis not present

## 2021-03-07 DIAGNOSIS — J189 Pneumonia, unspecified organism: Secondary | ICD-10-CM | POA: Diagnosis present

## 2021-03-07 DIAGNOSIS — I700 Atherosclerosis of aorta: Secondary | ICD-10-CM | POA: Diagnosis not present

## 2021-03-07 DIAGNOSIS — E1169 Type 2 diabetes mellitus with other specified complication: Secondary | ICD-10-CM | POA: Diagnosis not present

## 2021-03-07 DIAGNOSIS — R21 Rash and other nonspecific skin eruption: Secondary | ICD-10-CM | POA: Diagnosis present

## 2021-03-07 DIAGNOSIS — Z801 Family history of malignant neoplasm of trachea, bronchus and lung: Secondary | ICD-10-CM | POA: Diagnosis not present

## 2021-03-07 DIAGNOSIS — R7989 Other specified abnormal findings of blood chemistry: Secondary | ICD-10-CM | POA: Diagnosis present

## 2021-03-07 DIAGNOSIS — E1142 Type 2 diabetes mellitus with diabetic polyneuropathy: Secondary | ICD-10-CM | POA: Diagnosis present

## 2021-03-07 DIAGNOSIS — C3491 Malignant neoplasm of unspecified part of right bronchus or lung: Secondary | ICD-10-CM | POA: Diagnosis present

## 2021-03-07 DIAGNOSIS — E871 Hypo-osmolality and hyponatremia: Secondary | ICD-10-CM | POA: Diagnosis present

## 2021-03-07 DIAGNOSIS — J439 Emphysema, unspecified: Secondary | ICD-10-CM | POA: Diagnosis not present

## 2021-03-07 LAB — BASIC METABOLIC PANEL
Anion gap: 15 (ref 5–15)
Anion gap: 11 (ref 5–15)
Anion gap: 8 (ref 5–15)
BUN: 18 mg/dL (ref 8–23)
BUN: 14 mg/dL (ref 8–23)
BUN: 14 mg/dL (ref 8–23)
CO2: 19 mmol/L — ABNORMAL LOW (ref 22–32)
CO2: 22 mmol/L (ref 22–32)
CO2: 20 mmol/L — ABNORMAL LOW (ref 22–32)
Calcium: 8.5 mg/dL — ABNORMAL LOW (ref 8.9–10.3)
Calcium: 8.4 mg/dL — ABNORMAL LOW (ref 8.9–10.3)
Calcium: 8.2 mg/dL — ABNORMAL LOW (ref 8.9–10.3)
Chloride: 99 mmol/L (ref 98–111)
Chloride: 102 mmol/L (ref 98–111)
Chloride: 105 mmol/L (ref 98–111)
Creatinine, Ser: 1.03 mg/dL (ref 0.61–1.24)
Creatinine, Ser: 0.84 mg/dL (ref 0.61–1.24)
Creatinine, Ser: 0.71 mg/dL (ref 0.61–1.24)
GFR, Estimated: >60 mL/min (ref >60)
GFR, Estimated: >60 mL/min (ref >60)
GFR, Estimated: >60 mL/min (ref >60)
Glucose, Bld: 287 mg/dL — ABNORMAL HIGH (ref 70–99)
Glucose, Bld: 208 mg/dL — ABNORMAL HIGH (ref 70–99)
Glucose, Bld: 152 mg/dL — ABNORMAL HIGH (ref 70–99)
Potassium: 4.1 mmol/L (ref 3.5–5.1)
Potassium: 3.8 mmol/L (ref 3.5–5.1)
Potassium: 3.6 mmol/L (ref 3.5–5.1)
Sodium: 133 mmol/L — ABNORMAL LOW (ref 135–145)
Sodium: 135 mmol/L (ref 135–145)
Sodium: 133 mmol/L — ABNORMAL LOW (ref 135–145)

## 2021-03-07 LAB — COMPREHENSIVE METABOLIC PANEL
ALT: 20 U/L (ref 0–44)
AST: 12 U/L — ABNORMAL LOW (ref 15–41)
Albumin: 4.2 g/dL (ref 3.5–5.0)
Alkaline Phosphatase: 67 U/L (ref 38–126)
Anion gap: 23 — ABNORMAL HIGH (ref 5–15)
BUN: 19 mg/dL (ref 8–23)
CO2: 14 mmol/L — ABNORMAL LOW (ref 22–32)
Calcium: 10 mg/dL (ref 8.9–10.3)
Chloride: 93 mmol/L — ABNORMAL LOW (ref 98–111)
Creatinine, Ser: 1.26 mg/dL — ABNORMAL HIGH (ref 0.61–1.24)
GFR, Estimated: >60 mL/min (ref >60)
Glucose, Bld: 426 mg/dL — ABNORMAL HIGH (ref 70–99)
Potassium: 4 mmol/L (ref 3.5–5.1)
Sodium: 130 mmol/L — ABNORMAL LOW (ref 135–145)
Total Bilirubin: 0.8 mg/dL (ref 0.3–1.2)
Total Protein: 8.6 g/dL — ABNORMAL HIGH (ref 6.5–8.1)

## 2021-03-07 LAB — CBC WITH DIFFERENTIAL/PLATELET
Abs Immature Granulocytes: 0.05 10*3/uL (ref 0.00–0.07)
Basophils Absolute: 0.1 10*3/uL (ref 0.0–0.1)
Basophils Relative: 1 %
Eosinophils Absolute: 0 10*3/uL (ref 0.0–0.5)
Eosinophils Relative: 0 %
HCT: 39.6 % (ref 39.0–52.0)
Hemoglobin: 13.2 g/dL (ref 13.0–17.0)
Immature Granulocytes: 1 %
Lymphocytes Relative: 14 %
Lymphs Abs: 1 10*3/uL (ref 0.7–4.0)
MCH: 30 pg (ref 26.0–34.0)
MCHC: 33.3 g/dL (ref 30.0–36.0)
MCV: 90 fL (ref 80.0–100.0)
Monocytes Absolute: 1.1 10*3/uL — ABNORMAL HIGH (ref 0.1–1.0)
Monocytes Relative: 14 %
Neutro Abs: 5.3 10*3/uL (ref 1.7–7.7)
Neutrophils Relative %: 70 %
Platelets: 279 10*3/uL (ref 150–400)
RBC: 4.4 MIL/uL (ref 4.22–5.81)
RDW: 14.9 % (ref 11.5–15.5)
WBC: 7.6 10*3/uL (ref 4.0–10.5)
nRBC: 0 % (ref 0.0–0.2)

## 2021-03-07 LAB — CBG MONITORING, ED
Glucose-Capillary: 281 mg/dL — ABNORMAL HIGH (ref 70–99)
Glucose-Capillary: 188 mg/dL — ABNORMAL HIGH (ref 70–99)
Glucose-Capillary: 142 mg/dL — ABNORMAL HIGH (ref 70–99)
Glucose-Capillary: 141 mg/dL — ABNORMAL HIGH (ref 70–99)
Glucose-Capillary: 152 mg/dL — ABNORMAL HIGH (ref 70–99)

## 2021-03-07 LAB — I-STAT VENOUS BLOOD GAS, ED
Acid-base deficit: 7 mmol/L — ABNORMAL HIGH (ref 0.0–2.0)
Bicarbonate: 18.9 mmol/L — ABNORMAL LOW (ref 20.0–28.0)
Calcium, Ion: 1.18 mmol/L (ref 1.15–1.40)
HCT: 33 % — ABNORMAL LOW (ref 39.0–52.0)
Hemoglobin: 11.2 g/dL — ABNORMAL LOW (ref 13.0–17.0)
O2 Saturation: 53 %
Patient temperature: 97.6
Potassium: 3.9 mmol/L (ref 3.5–5.1)
Sodium: 134 mmol/L — ABNORMAL LOW (ref 135–145)
TCO2: 20 mmol/L — ABNORMAL LOW (ref 22–32)
pCO2, Ven: 37.4 mmHg — ABNORMAL LOW (ref 44.0–60.0)
pH, Ven: 7.309 (ref 7.250–7.430)
pO2, Ven: 29 mmHg — CL (ref 32.0–45.0)

## 2021-03-07 LAB — PROTIME-INR
INR: 1.1 (ref 0.8–1.2)
Prothrombin Time: 13.9 seconds (ref 11.4–15.2)

## 2021-03-07 LAB — GLUCOSE, CAPILLARY
Glucose-Capillary: 144 mg/dL — ABNORMAL HIGH (ref 70–99)
Glucose-Capillary: 153 mg/dL — ABNORMAL HIGH (ref 70–99)
Glucose-Capillary: 161 mg/dL — ABNORMAL HIGH (ref 70–99)
Glucose-Capillary: 164 mg/dL — ABNORMAL HIGH (ref 70–99)

## 2021-03-07 LAB — URINALYSIS, ROUTINE W REFLEX MICROSCOPIC
Bilirubin Urine: NEGATIVE
Glucose, UA: >1000 mg/dL — AB
Hgb urine dipstick: NEGATIVE
Ketones, ur: 40 mg/dL — AB
Leukocytes,Ua: NEGATIVE
Nitrite: NEGATIVE
Specific Gravity, Urine: 1.043 — ABNORMAL HIGH (ref 1.005–1.030)
pH: 5 (ref 5.0–8.0)

## 2021-03-07 LAB — RESP PANEL BY RT-PCR (FLU A&B, COVID) ARPGX2
Influenza A by PCR: NEGATIVE
Influenza B by PCR: NEGATIVE
SARS Coronavirus 2 by RT PCR: NEGATIVE

## 2021-03-07 LAB — LACTIC ACID, PLASMA
Lactic Acid, Venous: 2.1 mmol/L (ref 0.5–1.9)
Lactic Acid, Venous: 1.2 mmol/L (ref 0.5–1.9)

## 2021-03-07 LAB — BETA-HYDROXYBUTYRIC ACID: Beta-Hydroxybutyric Acid: 4.54 mmol/L — ABNORMAL HIGH (ref 0.05–0.27)

## 2021-03-07 LAB — MRSA NEXT GEN BY PCR, NASAL: MRSA by PCR Next Gen: NOT DETECTED

## 2021-03-07 IMAGING — CT CT CHEST W/ CM
2 of 4 series · 15 of 36 positions shown, 18 images · IV contrast (APPLIED)
Comparison: CT chest dated [DATE]

CLINICAL DATA: History of non-small cell lung cancer

EXAM:
CT CHEST WITH CONTRAST
TECHNIQUE: Multidetector CT imaging of the chest was performed during
intravenous contrast administration.
CONTRAST:  75mL OMNIPAQUE IOHEXOL 300 MG/ML  SOLN

[Series 2: routine chest with · axial · 0.75mm/px · z∈[+1161,+1425]mm · 12 of 158 slices shown, 15 images]
[im 13/158  mediastinal]
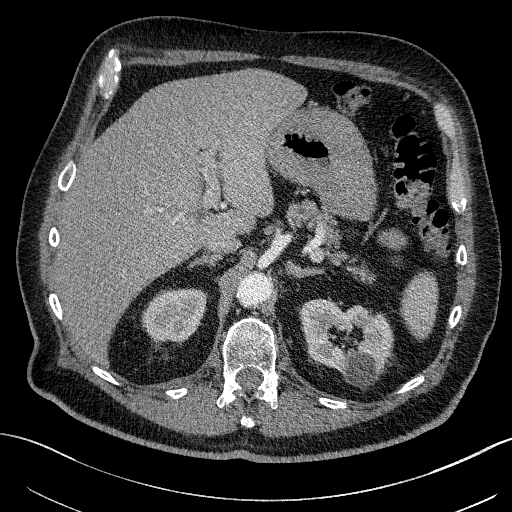
[im 13/158  lung]
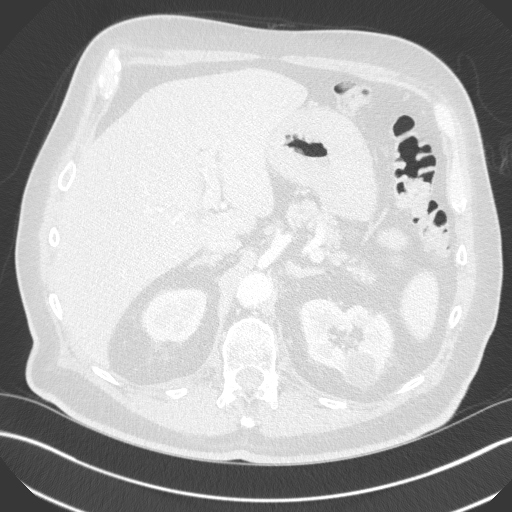
[im 25/158  lung]
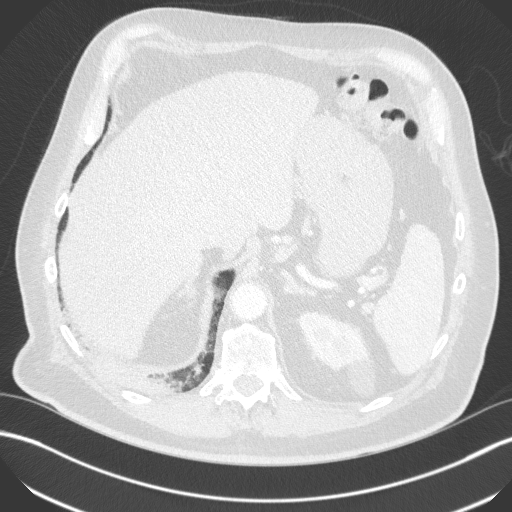
[im 37/158  lung]
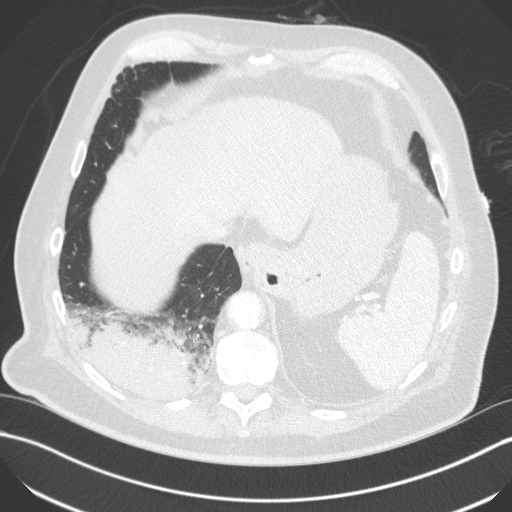
[im 49/158  lung]
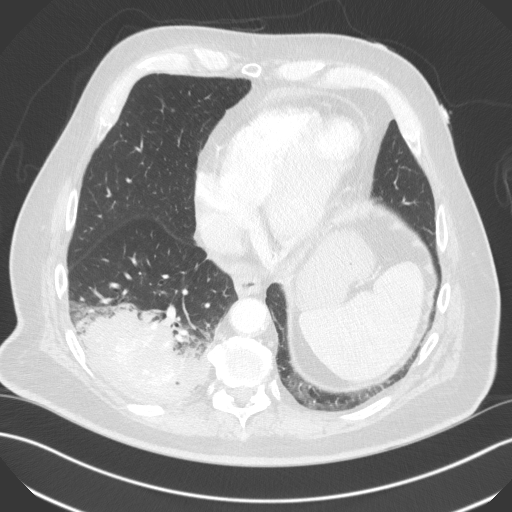
[im 61/158  mediastinal]
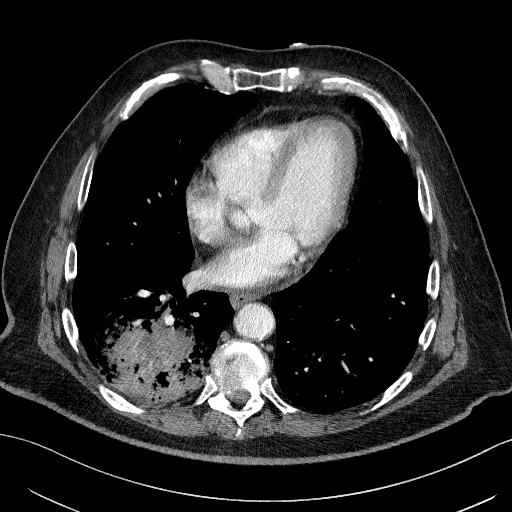
[im 61/158  lung]
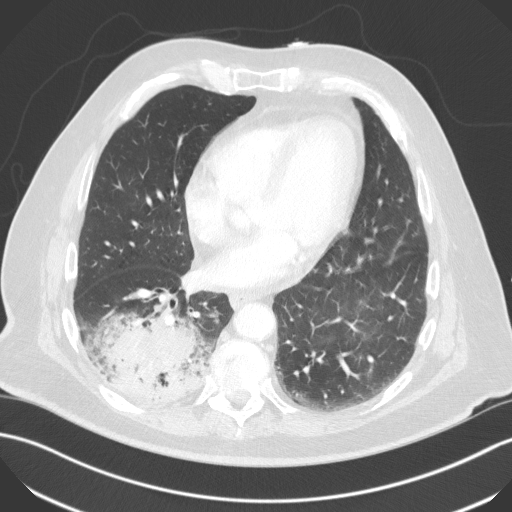
[im 73/158  lung]
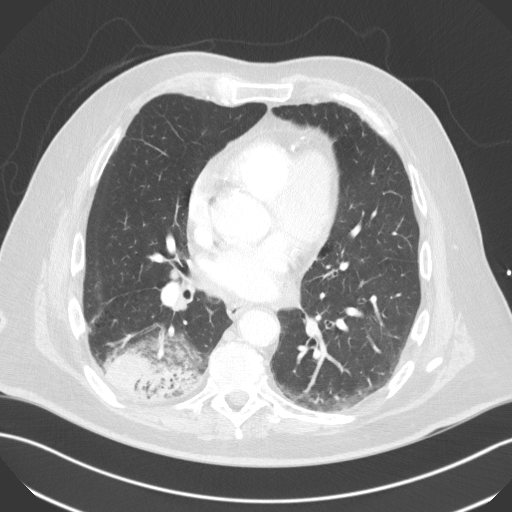
[im 85/158  lung]
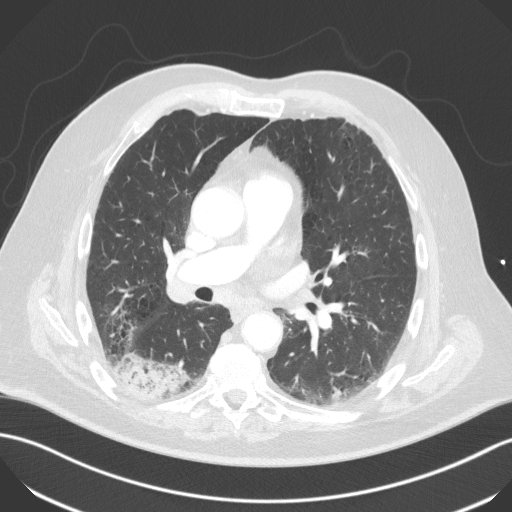
[im 97/158  lung]
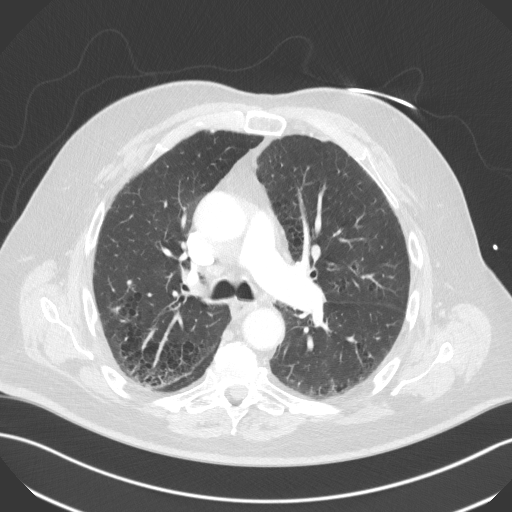
[im 109/158  mediastinal]
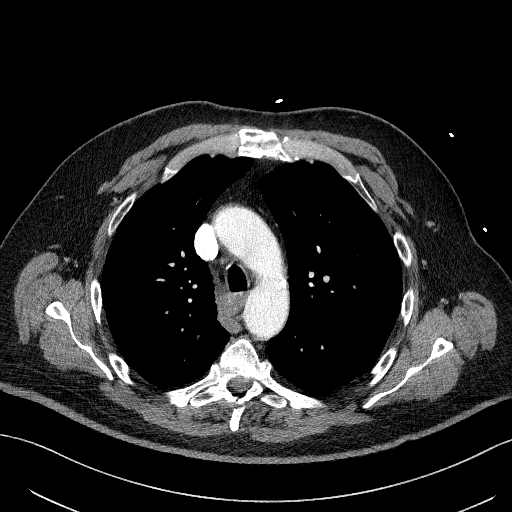
[im 109/158  lung]
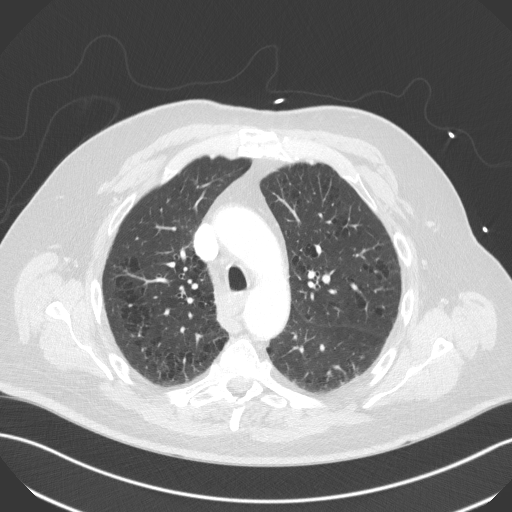
[im 121/158  lung]
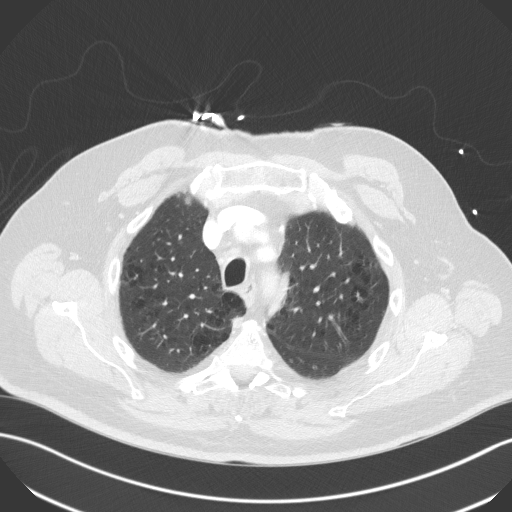
[im 133/158  lung]
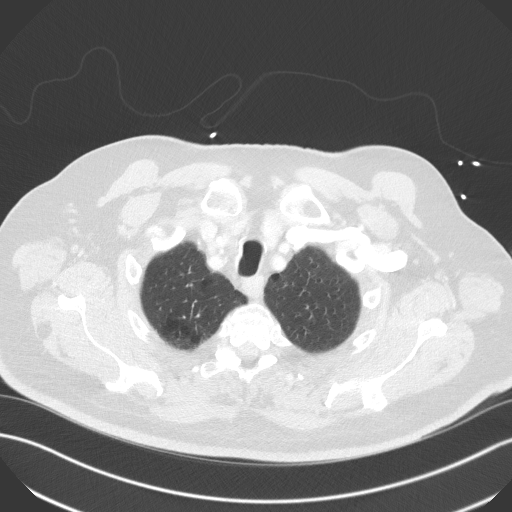
[im 145/158  lung]
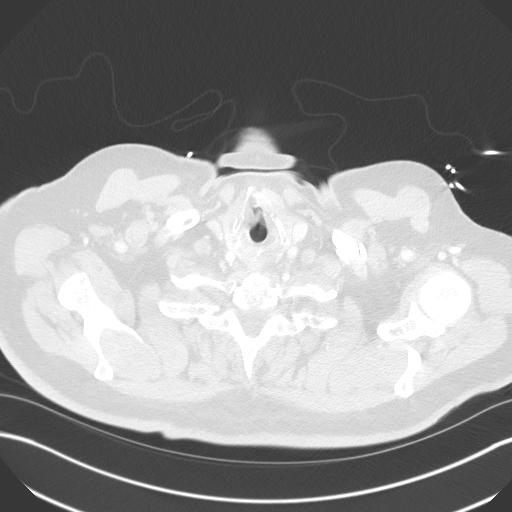

[Series 5: coronal · coronal · 0.62mm/px · 3 of 157 slices shown]
[im 32/157  lung]
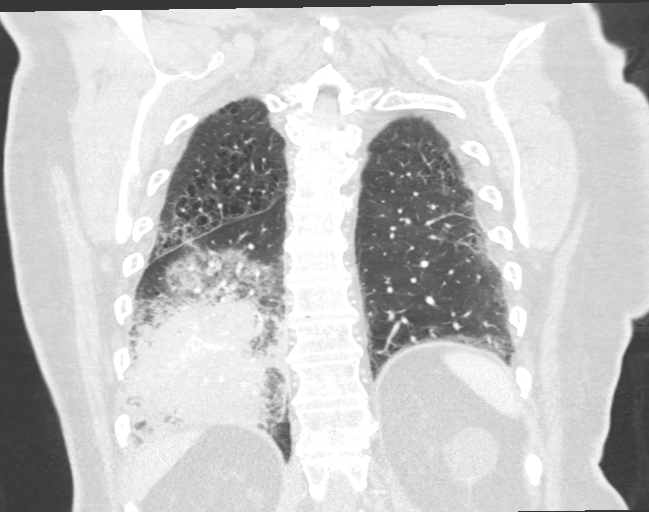
[im 63/157  lung]
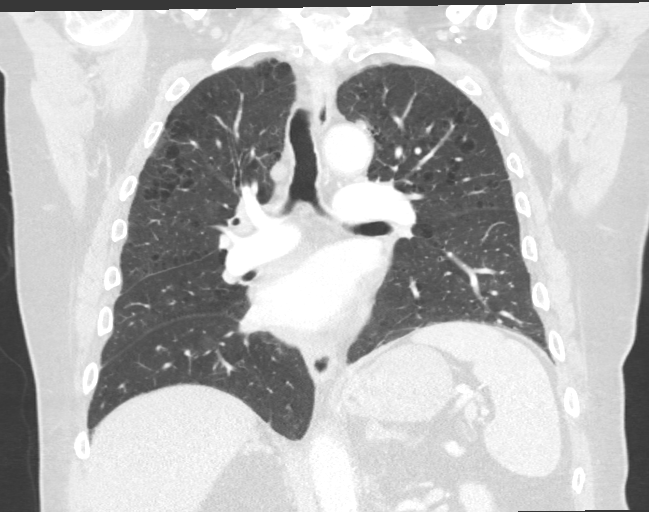
[im 94/157  lung]
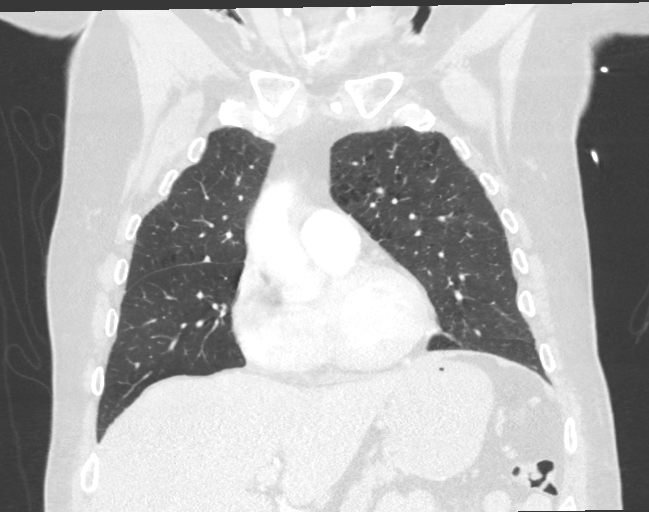

[15 of 36 positions shown; findings below may reference images not displayed]

FINDINGS: Cardiovascular: Normal heart size. No pericardial effusion.
Atherosclerotic disease of the thoracic aorta. Three-vessel coronary
artery calcifications.

Mediastinum/Nodes: New enlargement of hilar and mediastinal lymph
nodes, increased in size when compared with prior exam. Reference
subcarinal lymph node measuring 1.4 cm in short axis on series 2,
image 70. Reference right hilar lymph node measuring 1.4 cm in short
axis on series 2, image 74.

Lungs/Pleura: Central airways are patent. Centrilobular emphysema.
Interval increased size of right lower lobe mass, measures up to
x 6.0 cm in greatest axial dimension, previously 7.8 x 5.5 cm.
Central hyperdensity is again seen within the mass, similar to prior
exams. New ground-glass opacities are seen in the adjacent right
lower lobe, concerning for tumor extension. Stable solid pulmonary
nodule of the right upper lobe measuring 7 mm on series 4, image 80.

Upper Abdomen: Low-attenuation lesion of the left kidney, likely a
simple cyst. No acute abnormality.

Musculoskeletal: No chest wall abnormality. No acute or significant
osseous findings.
IMPRESSION: 1. Interval increased size of right lower lobe mass with new
possible extension into the adjacent right upper lobe.
2. New enlargement of right hilar and mediastinal lymph nodes,
concerning for metastatic disease.
3. Aortic Atherosclerosis ([OG]-[OG]) and Emphysema ([OG]-[OG]).

## 2021-03-07 IMAGING — DX DG CHEST 2V
2 series · 2 of 2 positions shown · non-contrast
Comparison: [DATE] [DATE], [DATE].  [DATE] [DATE], [DATE].

CLINICAL DATA: Sepsis.  Cough.

EXAM:
CHEST - 2 VIEW

[chest pa]
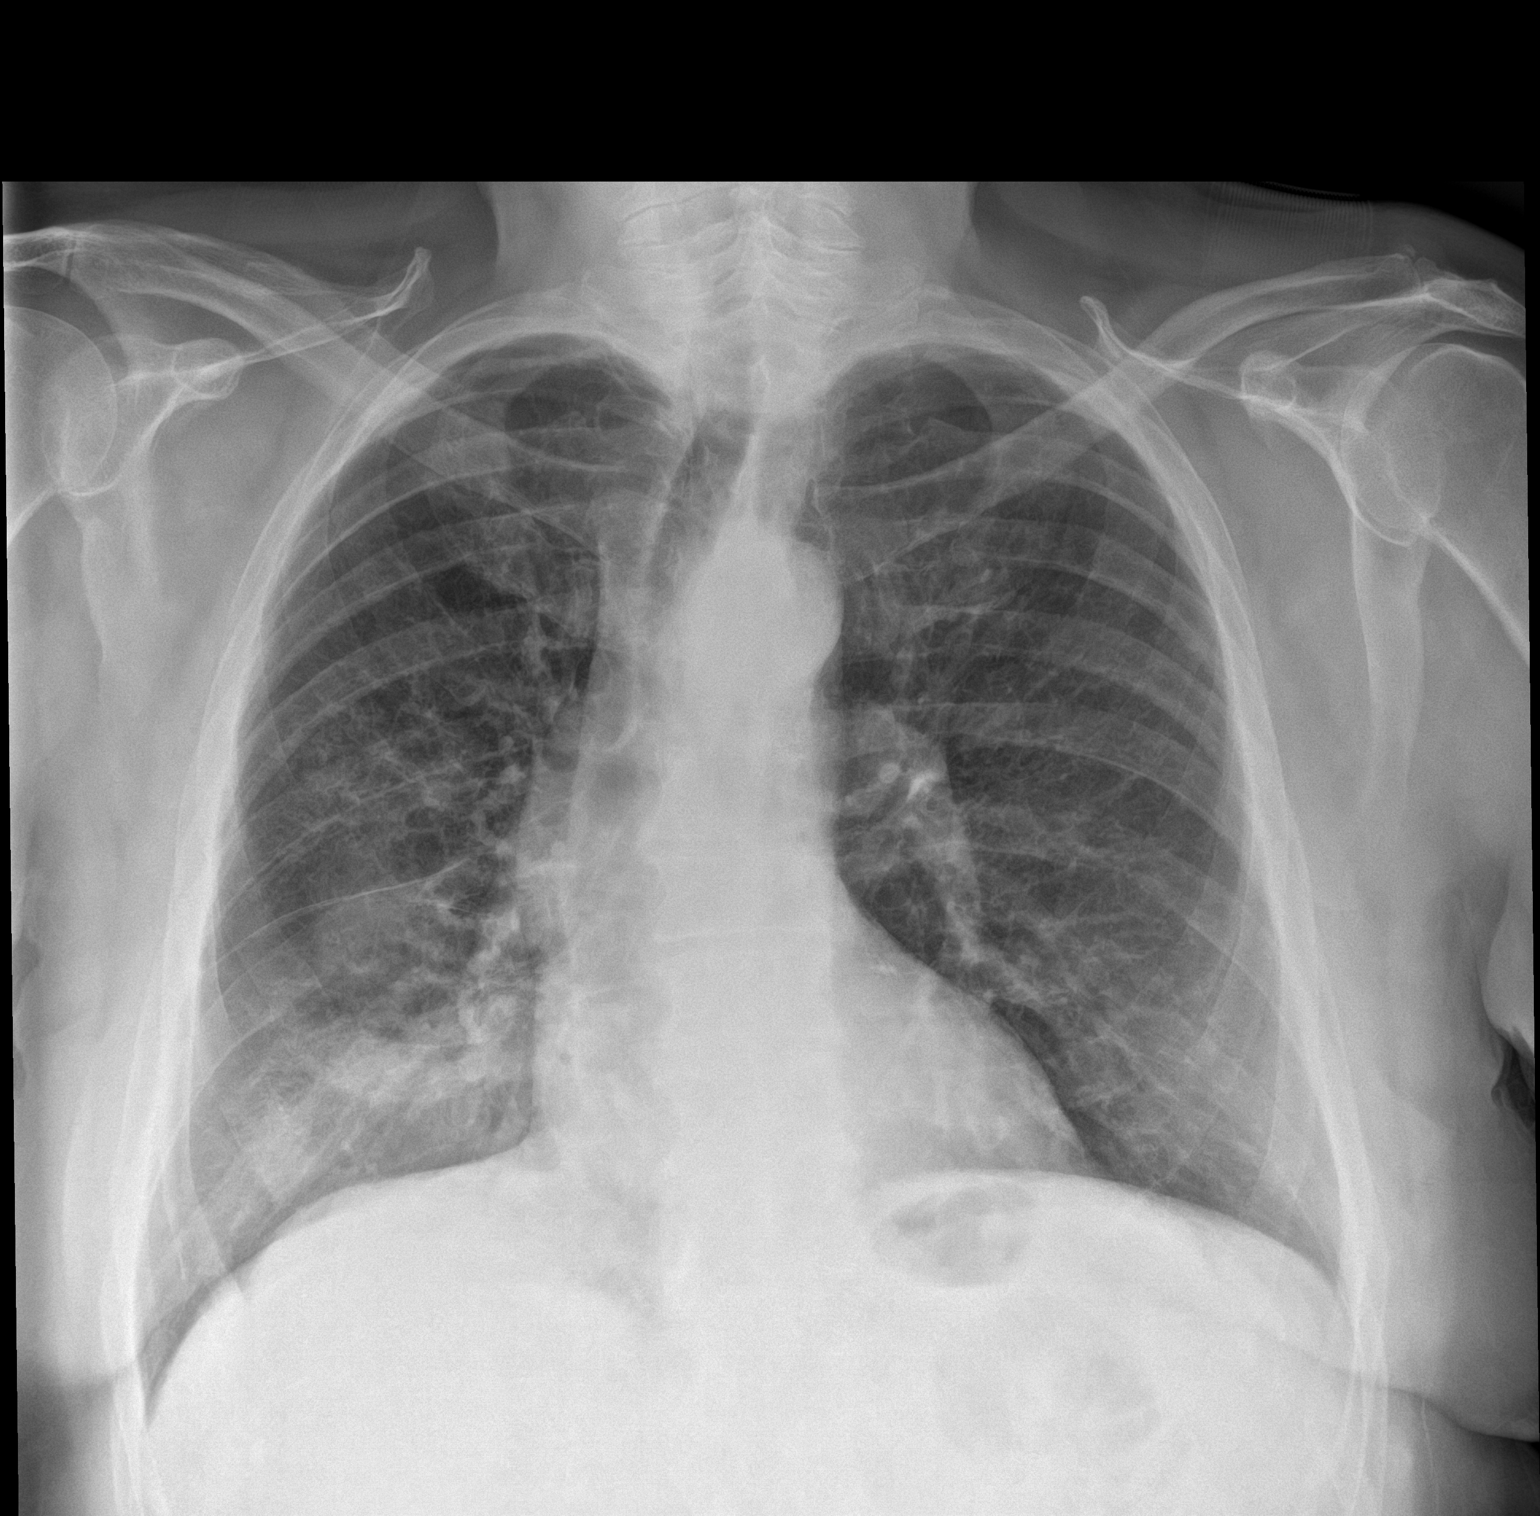

[chest lat]
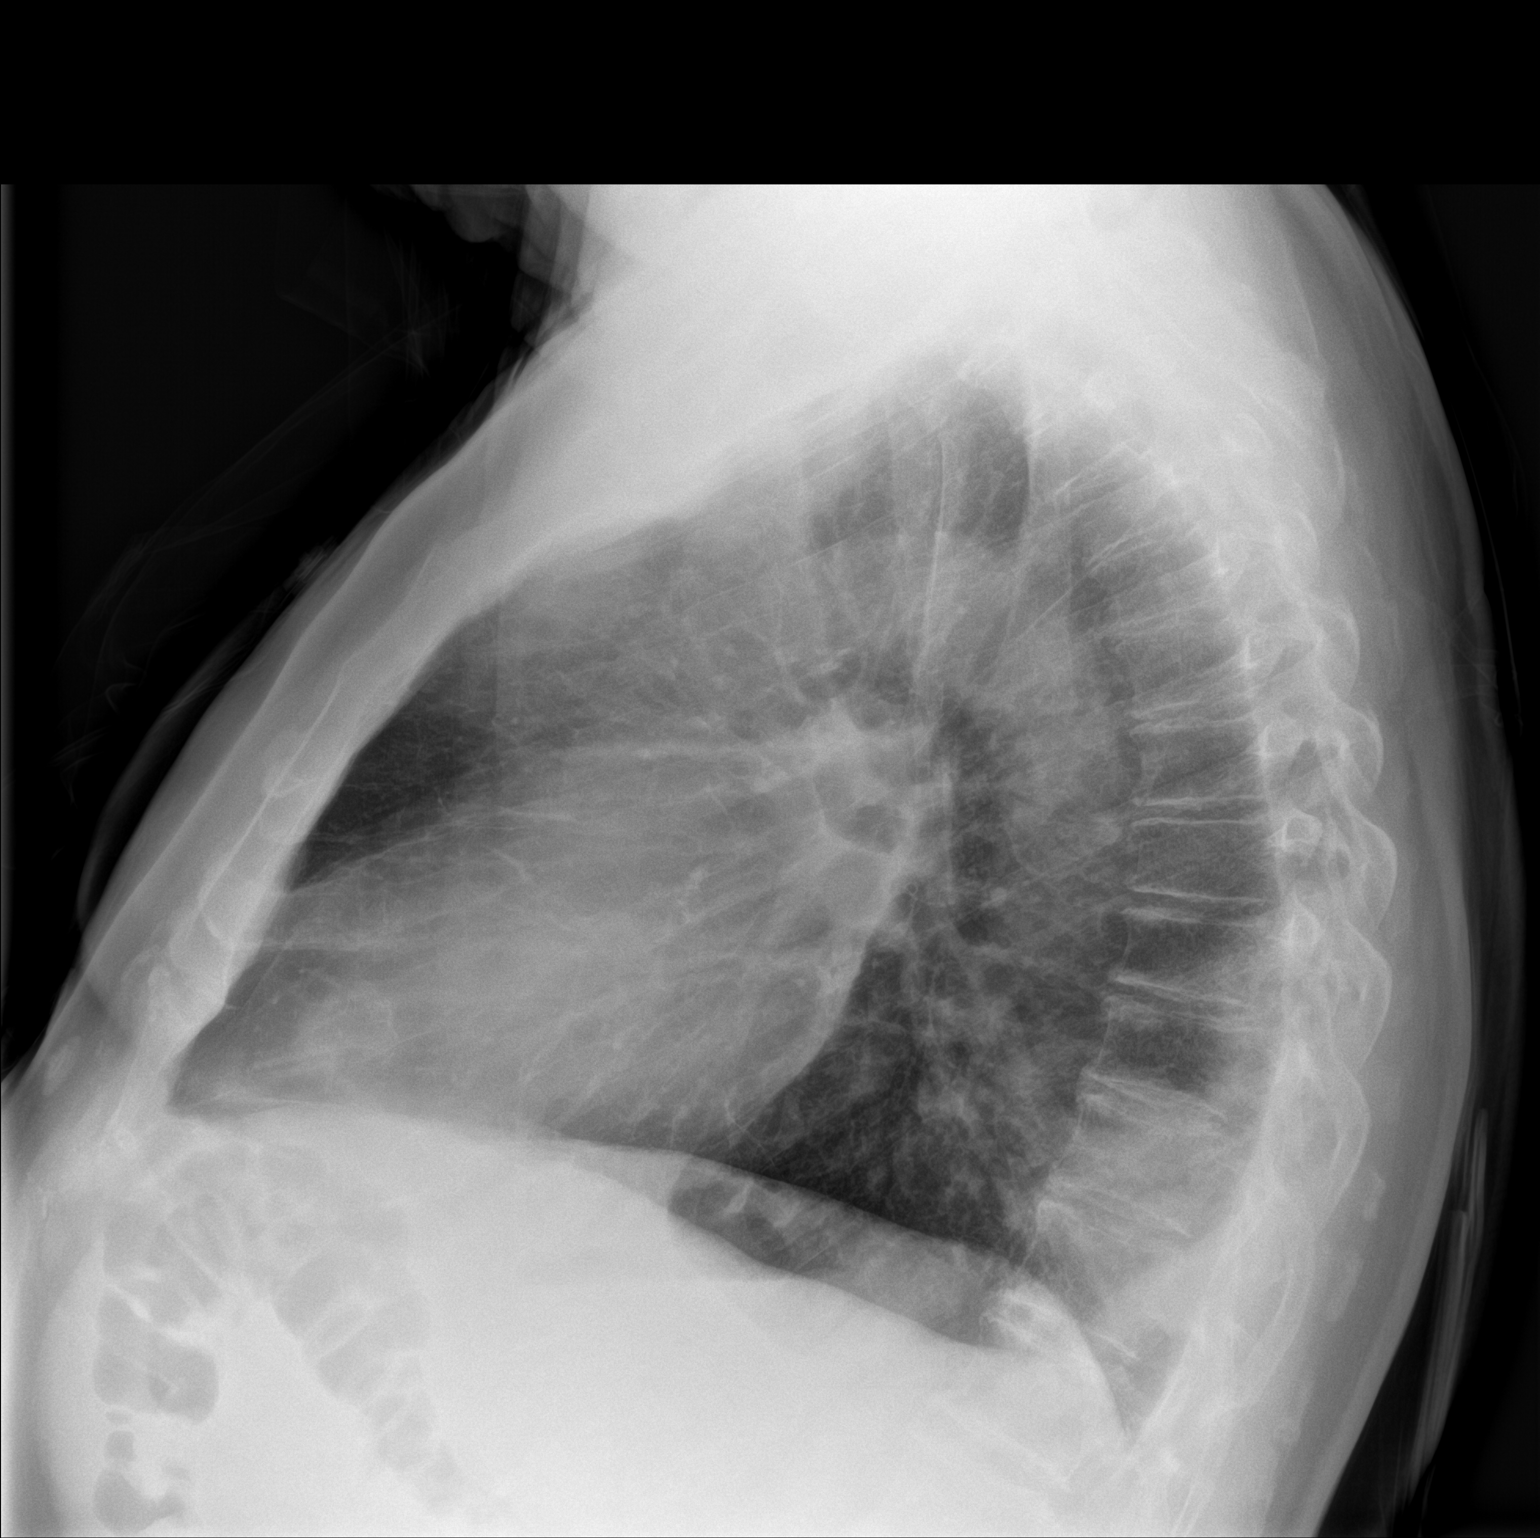

[2 of 2 positions shown; findings below may reference images not displayed]

FINDINGS: The heart size and mediastinal contours are within normal limits.
Left lung is clear. Increased right basilar opacity is noted
concerning for increased mass and associated atelectasis or
infiltrate. The visualized skeletal structures are unremarkable.
IMPRESSION: Increased right basilar opacity is noted concerning for increased
mass and associated atelectasis or infiltrate.

## 2021-03-07 MED ORDER — PANTOPRAZOLE SODIUM 40 MG PO TBEC
40.0000 mg | DELAYED_RELEASE_TABLET | Freq: Every day | ORAL | Status: DC
  Administered 2021-03-08 – 2021-03-09 (×2): 40 mg via ORAL
  Filled 2021-03-07 (×2): qty 1

## 2021-03-07 MED ORDER — LACTATED RINGERS IV SOLN: INTRAVENOUS | Status: DC

## 2021-03-07 MED ORDER — ADULT MULTIVITAMIN W/MINERALS CH
1.0000 | ORAL_TABLET | Freq: Every day | ORAL | Status: DC
  Administered 2021-03-08 – 2021-03-09 (×2): 1 via ORAL
  Filled 2021-03-07 (×2): qty 1

## 2021-03-07 MED ORDER — DEXTROSE IN LACTATED RINGERS 5 % IV SOLN: INTRAVENOUS | Status: DC

## 2021-03-07 MED ORDER — MORPHINE SULFATE (PF) 4 MG/ML IV SOLN
4.0000 mg | Freq: Once | INTRAVENOUS | Status: AC
  Administered 2021-03-07: 13:00:00 4 mg via INTRAVENOUS
  Filled 2021-03-07: qty 1

## 2021-03-07 MED ORDER — SODIUM CHLORIDE 0.9 % IV SOLN: 1000.0000 mL | INTRAVENOUS | Status: DC

## 2021-03-07 MED ORDER — ACETAMINOPHEN 325 MG PO TABS: 650.0000 mg | ORAL_TABLET | Freq: Four times a day (QID) | ORAL | Status: DC | PRN

## 2021-03-07 MED ORDER — MORPHINE SULFATE (PF) 4 MG/ML IV SOLN
4.0000 mg | Freq: Once | INTRAVENOUS | Status: AC
  Administered 2021-03-07: 20:00:00 4 mg via INTRAVENOUS
  Filled 2021-03-07: qty 1

## 2021-03-07 MED ORDER — SODIUM CHLORIDE 0.9 % IV BOLUS (SEPSIS): 500.0000 mL | Freq: Once | INTRAVENOUS | Status: DC

## 2021-03-07 MED ORDER — KCL IN DEXTROSE-NACL 10-5-0.45 MEQ/L-%-% IV SOLN
INTRAVENOUS | Status: DC
  Filled 2021-03-07 (×2): qty 1000

## 2021-03-07 MED ORDER — IOHEXOL 300 MG/ML  SOLN
75.0000 mL | Freq: Once | INTRAMUSCULAR | Status: AC | PRN
  Administered 2021-03-07: 15:00:00 75 mL via INTRAVENOUS

## 2021-03-07 MED ORDER — NALOXONE HCL 0.4 MG/ML IJ SOLN: 0.4000 mg | INTRAMUSCULAR | Status: DC | PRN

## 2021-03-07 MED ORDER — INSULIN REGULAR(HUMAN) IN NACL 100-0.9 UT/100ML-% IV SOLN
INTRAVENOUS | Status: DC
  Administered 2021-03-07: 14:00:00 6 [IU]/h via INTRAVENOUS
  Filled 2021-03-07: qty 100

## 2021-03-07 MED ORDER — ASPIRIN EC 81 MG PO TBEC
81.0000 mg | DELAYED_RELEASE_TABLET | Freq: Every day | ORAL | Status: DC
  Administered 2021-03-08 – 2021-03-09 (×2): 81 mg via ORAL
  Filled 2021-03-07 (×2): qty 1

## 2021-03-07 MED ORDER — LEVOFLOXACIN IN D5W 750 MG/150ML IV SOLN
750.0000 mg | INTRAVENOUS | Status: DC
  Administered 2021-03-08 – 2021-03-09 (×2): 750 mg via INTRAVENOUS
  Filled 2021-03-07 (×2): qty 150

## 2021-03-07 MED ORDER — METOPROLOL SUCCINATE ER 50 MG PO TB24
50.0000 mg | ORAL_TABLET | Freq: Every day | ORAL | Status: DC
  Administered 2021-03-08 – 2021-03-09 (×2): 50 mg via ORAL
  Filled 2021-03-07: qty 2
  Filled 2021-03-07: qty 1

## 2021-03-07 MED ORDER — POTASSIUM CHLORIDE 10 MEQ/100ML IV SOLN
10.0000 meq | INTRAVENOUS | Status: AC
  Administered 2021-03-07 (×2): 10 meq via INTRAVENOUS
  Filled 2021-03-07 (×2): qty 100

## 2021-03-07 MED ORDER — CHLORHEXIDINE GLUCONATE CLOTH 2 % EX PADS
6.0000 | MEDICATED_PAD | Freq: Every day | CUTANEOUS | Status: DC
  Administered 2021-03-07 – 2021-03-09 (×3): 6 via TOPICAL

## 2021-03-07 MED ORDER — LEVOFLOXACIN IN D5W 500 MG/100ML IV SOLN
500.0000 mg | Freq: Once | INTRAVENOUS | Status: AC
  Administered 2021-03-07: 13:00:00 500 mg via INTRAVENOUS
  Filled 2021-03-07: qty 100

## 2021-03-07 MED ORDER — SODIUM CHLORIDE 0.9 % IV BOLUS
30.0000 mL/kg | Freq: Once | INTRAVENOUS | Status: AC
  Administered 2021-03-07: 13:00:00 3000 mL via INTRAVENOUS

## 2021-03-07 MED ORDER — DEXTROSE 50 % IV SOLN: 0.0000 mL | INTRAVENOUS | Status: DC | PRN

## 2021-03-07 MED ORDER — FENTANYL CITRATE (PF) 100 MCG/2ML IJ SOLN: 25.0000 ug | INTRAMUSCULAR | Status: DC | PRN

## 2021-03-07 MED ORDER — SIMVASTATIN 40 MG PO TABS
40.0000 mg | ORAL_TABLET | Freq: Every day | ORAL | Status: DC
  Administered 2021-03-08: 18:00:00 40 mg via ORAL
  Filled 2021-03-07: qty 1

## 2021-03-07 MED ORDER — ACETAMINOPHEN 650 MG RE SUPP: 650.0000 mg | Freq: Four times a day (QID) | RECTAL | Status: DC | PRN

## 2021-03-07 NOTE — ED Notes (Signed)
Pt requesting something for pain; EDP made aware and meds ordered and given before transport by [facility]

## 2021-03-07 NOTE — Progress Notes (Signed)
[facility] Plan of Care Note for accepted transfer  Patient: Brandon Rubio.    KGU:[id number]  DOA: 03/07/2021     Facility requesting transfer: [facility] Requesting Provider: Dr. Tomi Bamberger Reason for transfer: Admission for DKA and possible pneumonia Facility course: 70 year old male with past medical history of CAD, type II DM, GERD, HLD, HTN, non-small cell lung cancer stage IIIa currently undergoing neoadjuvant chemotherapy with carboplatin, Alimta, nivolumab-first cycle January 23, 2021. Presented with complaints of cough shortness of breath and upper airway viral infection-like symptoms ongoing for last 2 days.  Work-up shows evidence of DKA with anion gap acidosis along with possible pneumonia. Receiving IV antibiotics and IV insulin as well as IV fluids. Plan of care: The patient is accepted for admission to [facility] unit, at [place] test pending.  Currently PUI.  Author: Berle Mull, MD  03/07/2021  Check [email or web address] for on-call coverage.

## 2021-03-07 NOTE — H&P (Signed)
History and Physical    PLEASE NOTE THAT DRAGON DICTATION SOFTWARE WAS USED IN THE CONSTRUCTION OF THIS NOTE.   Brandon Rubio. IPJ:[id number] DOB: 07/16/1950 DOA: 03/07/2021  PCP: [name], MD  Patient coming from: home   I have personally briefly reviewed patient's old medical records in [place]  Chief Complaint: sob  HPI: Brandon Rubio. is a 70 y.o. male with medical history significant for stage III non-small cell lung cancer undergoing neoadjuvant chemotherapy, type 2 diabetes mellitus, hyperlipidemia, chronic tobacco abuse, who is admitted to [facility] on 03/07/2021 by way of transfer from [facility] emergency department with suspected dka in setting of community-acquired pneumonia after presenting from home to the latter facility complaining of shortness of breath.   The patient reports 2 days of progressive shortness of breath associated with worsening nonproductive cough as well as subjective fever in the absence of chills, full body rigors, or generalized myalgias.  He notes right basilar chest wall discomfort that is limited periods of coughing.  Not associate with any hemoptysis, calf tenderness, or new lower extremity erythema.  Otherwise he denies any recent chest pain, diaphoresis, palpitations.  Also denies any associated orthopnea, PND, or new onset peripheral edema.  No recent trauma or travel.  Denies any nausea, vomiting, diarrhea, abdominal pain, or rash.  No recent dysuria, gross hematuria, or change in urinary urgency/frequency.  In the setting of the long cigarette smoking history, the patient has a history of stage III non-small cell lung cancer undergoing neoadjuvant chemotherapy on carboplatin, alimta, and nivolumab, with first cycle chemotherapy noted to have occurred on January 23, 2021.  While he denies any recent nausea, vomiting, diarrhea, he notes a decline in his oral intake of both food and water over the last 1 to 2  weeks in the context of concomitant decline in appetite over that timeframe.  Medical history also notable for type 2 diabetes mellitus for which she is on metformin and  empaglifozin, in the absence of any insulin.  Most recent hemoglobin A1c noted to be 8.3% when checked in July 2022.  In the setting of the aforementioned 2 days progressive shortness of breath, the patient presented to [facility] emergency department today for further evaluation management thereof.  He acknowledges that he is a current smoker, continuing to smoke 1 pack/day, and noting that his duration of smoking is greater than 55 years, with conveyance that at times during that timeframe he had smoked 2 packs/day.  Denies any known history of underlying COPD, and denies any known baseline supplemental oxygen requirements.    ED Course:  Vital signs in the ED were notable for the following: Temperature max 97.6; heart rate initially 113, which decreased to 100 following interval initiation of IV fluids, as further quantified below; blood pressure 117/75 -146/77; respiratory rate 21-27, oxygen saturation 94 to 97% on room air.  Labs were notable for the following: VBG notable for 7.309/37.4 with bicarbonate 18.9; Initial CMP notable for the following:  potassium 4.0, bicarbonate 14, anion gap 23, creatinine 1.26 relative to most recent prior value 1.13 on 02/26/2021, and liver enzymes found to be within normal limits.  Glucose 426.  Initial lactate 2.1, which improved to 125 interval IV fluids.  CBC notable for white cell count 7600 with 70% neutrophils, hemoglobin 13.2.  INR 1.1.  Urinalysis notable for no white blood cells, leukocyte Estrace/nitrate negative, 40 ketones, specific gravity 1.043, COVID-19/influenza PCR were checked in the ED today [place] negative.  Blood  cultures x2 were collected prior to initiation of IV antibiotics.  Imaging and additional notable ED work-up: EKG showed sinus tachycardia with heart rate 102,  normal intervals, no evidence of T wave or ST changes, including no evidence of ST elevation.  CT chest with contrast showed increased right basilar opacity concerning for interval increase in mass with associated infiltrate, will demonstrating no evidence of edema, pleural effusion, or pneumothorax.  While in the ED, the following were administered: In the context of concern for DKA, the patient was started on insulin drip with associated Endo tool management; he also received normal saline times 3L bolus followed by initiation of D5 LR at 125 cc/h, and preceded by potassium chloride 20 mEq IV over 2 hours x 1 dose; for his pleuritic right basilar chest discomfort he received morphine 4 mg IV x2 doses; Levaquin.  He was subsequently transferred to [facility] for admission to stepdown unit for further evaluation management of suspected DKA in the setting of suspected community-acquired pneumonia.      Review of Systems: As per HPI otherwise 10 point review of systems negative.   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   Peripheral neuropathy    toes    Past Surgical History:  Procedure Laterality Date   BRONCHIAL BIOPSY  12/10/2020   Procedure: BRONCHIAL BIOPSIES;  Surgeon: Collene Gobble, MD;  Location: [facility] ENDOSCOPY;  Service: Pulmonary;;   BRONCHIAL BRUSHINGS  12/10/2020   Procedure: BRONCHIAL BRUSHINGS;  Surgeon: Collene Gobble, MD;  Location: [facility] ENDOSCOPY;  Service: Pulmonary;;   BRONCHIAL NEEDLE ASPIRATION BIOPSY  12/10/2020   Procedure: BRONCHIAL NEEDLE ASPIRATION BIOPSIES;  Surgeon: Collene Gobble, MD;  Location: [facility] ENDOSCOPY;  Service: Pulmonary;;    COLONOSCOPY     COLONOSCOPY W/ POLYPECTOMY  03/10/2009   LARYNGOSCOPY  03/10/1997   VIDEO BRONCHOSCOPY WITH ENDOBRONCHIAL NAVIGATION N/A 12/10/2020   Procedure: ROBOTIC [place];  Surgeon: Collene Gobble, MD;  Location: [facility] ENDOSCOPY;  Service: Pulmonary;  Laterality: N/A;    Social History:  reports that he has been smoking cigarettes. He has a 58.00 pack-year smoking history. He has never used smokeless tobacco. He reports that he does not drink alcohol and does not use drugs.   Allergies  Allergen Reactions   Penicillins Swelling    REACTION: throat swelling Because of a history of documented adverse serious drug reaction;[name] Alert bracelet  is recommended    Family History  Problem Relation Age of Onset   Lung cancer Mother        X 2   Colon polyps Father    Colon cancer Father 50   Coronary artery disease Father    Lung disease Father    Esophageal cancer Neg Hx    Stomach cancer Neg Hx    Rectal cancer Neg Hx     Family history reviewed and not pertinent    Prior to Admission medications   Medication Sig Start Date End Date Taking? Authorizing Provider  aspirin EC 81 MG tablet Take 81 mg by mouth in the morning. Swallow whole.  Yes [provider]  empagliflozin (JARDIANCE) 25 MG TABS tablet Take 1 tablet (25 mg total) by mouth daily. 08/07/20  Yes [name], MD  folic acid (FOLVITE) 1 MG tablet Take 1 tablet (1 mg total) by mouth daily. 01/16/21  Yes Curt Bears, MD  Melatonin 5 MG CHEW Chew 5 mg by mouth at bedtime.   Yes [provider]  metFORMIN (GLUCOPHAGE) 1000 MG tablet TAKE 1 TABLET BY MOUTH TWICE DAILY WITH A MEAL 09/14/20  Yes [name], MD  metoprolol succinate (TOPROL-XL) 50 MG 24 hr tablet Take 1 tablet (50 mg total) by mouth daily. Take with or immediately following a meal. 01/18/21  Yes [name], MD  Multiple Vitamin (MULTIVITAMIN WITH MINERALS) TABS  tablet Take 1 tablet by mouth daily. Centrum Silver   Yes [provider]  omeprazole (PRILOSEC) 20 MG capsule TAKE 1 CAPSULE(20 MG) BY MOUTH TWICE DAILY BEFORE A MEAL 10/02/20  Yes [name], MD  prochlorperazine (COMPAZINE) 10 MG tablet Take 1 tablet (10 mg total) by mouth every 6 (six) hours as needed for nausea or vomiting. 02/21/21  Yes Curt Bears, MD  simvastatin (ZOCOR) 40 MG tablet Take 1 tablet (40 mg total) by mouth daily at 6 PM. 06/26/20  Yes [name], MD  glucose blood (FREESTYLE LITE) test strip USE TO TEST BLOOD SUGAR TWICE DAILY ICD E11.49 01/18/14   Hendricks Limes, MD  nicotine (NICODERM CQ - DOSED IN MG/24 HOURS) 21 mg/24hr patch Place 1 patch (21 mg total) onto the skin daily. 12/28/20   [name], MD  traZODone (DESYREL) 50 MG tablet Take 1-2 tablets (50-100 mg total) by mouth at bedtime as needed for sleep. Patient taking differently: Take 50-100 mg by mouth at bedtime as needed for sleep. Pt states taking two tablets 12/28/20   [name], MD  triamcinolone cream (KENALOG) 0.1 % Apply 1 application topically 2 (two) times daily as needed (poison ivy). 12/10/20   Collene Gobble, MD     Objective    Physical Exam: Vitals:   03/07/21 1730 03/07/21 1800 03/07/21 1845 03/07/21 2021  BP: 121/61 100/67 (!) 119/59 (!) 154/69  Pulse: 96 97 100 (!) 104  Resp: (!) 25 (!) 26 (!) 33 (!) 24  Temp:    99 F (37.2 C)  TempSrc:    Oral  SpO2: 97% 94% 94% 94%  Weight:    94.4 kg  Height:    6' (1.829 m)    General: appears to be stated age; alert, oriented; mildly increased work of breathing noted Skin: warm, dry, no rash Head:  AT/[place] Mouth:  Oral mucosa membranes appear dry, normal dentition Neck: supple; trachea midline Heart:  RRR; did not appreciate any M/R/G Lungs: CTAB, did not appreciate any wheezes, rales, or rhonchi Abdomen: + BS; soft, ND, NT Vascular: 2+ pedal pulses b/l; 2+ radial pulses  b/l Extremities: no peripheral edema, no muscle wasting Neuro: strength and sensation intact in upper and lower extremities b/l     Labs on Admission: I have personally reviewed following labs and imaging studies  CBC: Recent Labs  Lab 03/07/21 1202 03/07/21 1415  WBC 7.6  --   NEUTROABS 5.3  --   HGB 13.2 11.2*  HCT 39.6 33.0*  MCV 90.0  --   PLT 279  --    Basic Metabolic Panel: Recent Labs  Lab 03/07/21 1202 03/07/21 1412 03/07/21 1415 03/07/21 1835  NA 130* 133* 134* 135  K 4.0 4.1 3.9 3.8  CL 93* 99  --  102  CO2 14* 19*  --  22  GLUCOSE 426* 287*  --  208*  BUN 19 18  --  14  CREATININE 1.26* 1.03  --  0.84  CALCIUM 10.0 8.5*  --  8.4*   GFR: Estimated Creatinine Clearance: 94.8 mL/min (by C-G formula based on SCr of 0.84 mg/dL). Liver Function Tests: Recent Labs  Lab 03/07/21 1202  AST 12*  ALT 20  ALKPHOS 67  BILITOT 0.8  PROT 8.6*  ALBUMIN 4.2   No results for input(s): LIPASE, AMYLASE in the last 168 hours. No results for input(s): AMMONIA in the last 168 hours. Coagulation Profile: Recent Labs  Lab 03/07/21 1202  INR 1.1   Cardiac Enzymes: No results for input(s): CKTOTAL, CKMB, CKMBINDEX, TROPONINI in the last 168 hours. BNP (last 3 results) No results for input(s): PROBNP in the last 8760 hours. HbA1C: No results for input(s): HGBA1C in the last 72 hours. CBG: Recent Labs  Lab 03/07/21 1520 03/07/21 1646 03/07/21 1744 03/07/21 1855 03/07/21 2027  GLUCAP 188* 142* 141* 152* 153*   Lipid Profile: No results for input(s): CHOL, HDL, LDLCALC, TRIG, CHOLHDL, LDLDIRECT in the last 72 hours. Thyroid Function Tests: No results for input(s): TSH, T4TOTAL, FREET4, T3FREE, THYROIDAB in the last 72 hours. Anemia Panel: No results for input(s): VITAMINB12, FOLATE, FERRITIN, TIBC, IRON, RETICCTPCT in the last 72 hours. Urine analysis:    Component Value Date/Time   COLORURINE YELLOW 03/07/2021 [place] 03/07/2021 1205    LABSPEC 1.043 (H) 03/07/2021 1205   PHURINE 5.0 03/07/2021 1205   GLUCOSEU >1,000 (A) 03/07/2021 1205   HGBUR NEGATIVE 03/07/2021 1205   [place] 03/07/2021 1205   KETONESUR 40 (A) 03/07/2021 1205   PROTEINUR TRACE (A) 03/07/2021 1205   UROBILINOGEN 0.2 12/11/2007 2313   NITRITE NEGATIVE 03/07/2021 1205   LEUKOCYTESUR NEGATIVE 03/07/2021 1205    Radiological Exams on Admission: DG Chest 2 View  Result Date: 03/07/2021 CLINICAL DATA:  Sepsis.  Cough. EXAM: CHEST - 2 VIEW COMPARISON:  December 11, 2020.  December 06, 2020. FINDINGS: The heart size and mediastinal contours are within normal limits. Left lung is clear. Increased right basilar opacity is noted concerning for increased mass and associated atelectasis or infiltrate. The visualized skeletal structures are unremarkable. IMPRESSION: Increased right basilar opacity is noted concerning for increased mass and associated atelectasis or infiltrate. Electronically Signed   By: Marijo Conception M.D.   On: 03/07/2021 12:26   CT Chest W Contrast  Result Date: 03/07/2021 CLINICAL DATA:  History of non-small cell lung cancer EXAM: CT CHEST WITH CONTRAST TECHNIQUE: Multidetector CT imaging of the chest was performed during intravenous contrast administration. CONTRAST:  68m OMNIPAQUE IOHEXOL 300 MG/ML  SOLN COMPARISON:  CT chest dated November 16, 2020 FINDINGS: Cardiovascular: Normal heart size. No pericardial effusion. Atherosclerotic disease of the thoracic aorta. Three-vessel coronary artery calcifications. Mediastinum/Nodes: New enlargement of hilar and mediastinal lymph nodes, increased in size when compared with prior exam. Reference subcarinal lymph node measuring 1.4 cm in short axis on series 2, image 70. Reference right hilar lymph node measuring 1.4 cm in short axis on series 2, image 74. Lungs/Pleura: Central airways are patent. Centrilobular emphysema. Interval increased size of right lower lobe mass, measures up to 9.5 x  6.0 cm in greatest axial dimension, previously 7.8 x 5.5 cm. Central hyperdensity is again seen within the mass, similar to prior exams. New  ground-glass opacities are seen in the adjacent right lower lobe, concerning for tumor extension. Stable solid pulmonary nodule of the right upper lobe measuring 7 mm on series 4, image 80. Upper Abdomen: Low-attenuation lesion of the left kidney, likely a simple cyst. No acute abnormality. Musculoskeletal: No chest wall abnormality. No acute or significant osseous findings. IMPRESSION: 1. Interval increased size of right lower lobe mass with new possible extension into the adjacent right upper lobe. 2. New enlargement of right hilar and mediastinal lymph nodes, concerning for metastatic disease. 3. Aortic Atherosclerosis (ICD10-I70.0) and Emphysema (ICD10-J43.9). Electronically Signed   By: Yetta Glassman M.D.   On: 03/07/2021 15:10     EKG: Independently reviewed, with result as described above.    Assessment/Plan    Principal Problem:   DKA (diabetic ketoacidosis) ([place]) Active Problems:   Uncontrolled diabetes mellitus with hyperglycemia, without long-term current use of insulin (HCC) with neuropathy   Hyperlipidemia associated with type 2 diabetes mellitus ([place])   Essential hypertension   CAD (coronary artery disease)   GERD (gastroesophageal reflux disease)   Adenocarcinoma of right lung, stage 3 (HCC)   Pseudohyponatremia   CAP (community acquired pneumonia)   Dehydration   SOB (shortness of breath)    #) Diabetic ketoacidosis: Patient transferred [facility] for admission for suspicion of DKA in the context of a history of type 2 diabetes mellitus, with most recent hemoglobin A1c 8.3% in July 2022, with criteria felt to be present for DKA on basis of the following laboratory eval: presenting blood sugar of 426 a/w presenting blood gas consistent with metabolic acidosis,  while CMP demonstrates anion gap metabolic acidosis with serum bicarbonate  of 14 and AG of 23, and urinalysis demonstrating 40 ketones. Additional presenting labs notable for serum potassium of 4.0.  Of note, patient has type 2 diabetes mellitus, and does not appear to previously required insulin, rather is on metformin and empagliflozin as outpatient management.  In terms of precipitating factors increase this presentation, there appears to be contributions from physiologic stress from presenting suspected right lower lobe pneumonia, as well as suspected physiologic stress stemming from stage III lung cancer, with recently initiated chemotherapy, as above.  Evidence of additional underlying infectious process at this time, including negative COVID-19/[place] PCR, while urinalysis is nonsuggestive of UTI.  CT chest with IV contrast, aside from the suspicion for right lower lobe pneumonia in the context of interval increase in size of right lower lobe mass, no additional acute cardiopulmonary process identified, including no evidence of edema, pneumothorax, pleural effusion.  Additionally,ACS is felt to be less likely at this time in setting of ekg  showing no e/o acute ischemic changes, and in the absence of any associated typical chest pain.   In [facility] ED, insulin drip was initiated, and the following IVF administered: Following 3 L NS bolus, was started on D5 LR at 125 cc/h, noting preceding 20 mEq of IV potassium chloride.  In the context of presenting blood sugar of 152 and serum potassium less than 5.0, will transition IV fluids to D5-1/2NS with 10 mEq/L Kcl at this time.     Plan: DKA protocol initiated. Insulin drip per Endotool protocol. Q1H cbg's. Q4H BMP's in order to monitor ensuing AG, Na, and potassium.  Transition IV fluids to D5-1/2NS with 10 mEq/L Kcl, as above. Check serum Mg and Phos levels, w/ prn supplementation per protocol. Monitor on telemetry. Monitor strict I's & O's.  Check A1c.  Prn IV Zofran.  Further evaluation management of  suspected right lower  lobe pneumonia, as above.  Hold home metformin and empagliflozin during this hospitalization.       #) Community acquired pneumonia: Diagnosis on basis of 2 to 3 days of progressive shortness of breath associated with progressive nonproductive cough, subjective fever, and CT chest with IV contrast showing evidence of interval development of right lower lobe infiltrate concerning for pneumonia, potentially representing a postobstructive process in the context of right lower lobe mass in setting of known non-small cell lung cancer.  In the absence of any objective fever or other leukocytosis, SIRS criteria are not met at this time for sepsis.  Presentation has not been associate with any hypotension.  Initial lactate was mildly elevated 2.1, potentially part from presenting infection, but also likely from a degree of dehydration as well as from suspected DKA, as above, with ensuing improvement to 1.2 following interval IV fluids as outlined above.  COVID-19/influenza PCR negative.  In setting of a documented history of allergic reaction to penicillin, with associated swelling and airway compromise, will continue IV Levaquin that was initiated at [facility] earlier today.  Plan: Monitor for blood cultures x2 collected [place] today prior to initiation of Levaquin.  Continue Levaquin.  Add on procalcitonin.  Check strep pneumoniae urine antigen.  Repeat CBC differential in the morning.  NSAIDs [place].  Flutter valve.  Follow-up results of MRSA PCR.  As needed IV fentanyl for right basilar pleuritic chest discomfort, which appears consistent with area of known right lower lobe mass, as opposed to representing an acute pulmonary embolism.       #) Dehydration: Clinical and physical exam evidence to suggest such, including the presence of dry oral mucous membranes, as well as presenting urinalysis demonstrating elevated specific gravity.  This appears multifactorial in etiology,  with contributions from diminished oral intake over the last few weeks, as above, as well as likely contribution from auto diuresis in the setting of presenting hyperglycemia, as above.  Status post IV fluid bolus and [facility] earlier today, as above.   Plan: IV fluid management per plan outlined for management of presenting DKA, as above.  Monitor strict I's and O's and daily weights.  Repeat BMP in the morning.         #) GERD: documented h/o such; on omeprazole as outpatient.   Plan: continue home PPI.         #) Hyperlipidemia: documented h/o such. On simvastatin as outpatient.    Plan: continue home statin.       DVT prophylaxis: SCD's   Code Status: Full code Family Communication: none Disposition Plan: Per Rounding Team Consults called: none;  Admission status: inpatient; sdu  Warrants inpatient status on basis of IV antibiotics and further IV fluids/IV insulin management in setting of suspected presenting DKA, as above   PLEASE NOTE THAT DRAGON DICTATION SOFTWARE WAS USED IN THE CONSTRUCTION OF THIS NOTE.   Sentinel DO [facility]   03/07/2021, 8:43 PM

## 2021-03-07 NOTE — Progress Notes (Signed)
Pharmacy Antibiotic Note  Brandon Rubio. is a 70 y.o. male admitted on 03/07/2021 with DKA and pneumonia.  Pharmacy has been consulted for Levaquin dosing.  Plan: Levaquin 750mg  IV q24h No dose adjustment needed, Pharmacy will sign off  Height: 6' (182.9 cm) Weight: 94.4 kg (208 lb 1.8 oz) IBW/kg (Calculated) : 77.6  Temp (24hrs), Avg:98.3 F (36.8 C), Min:97.6 F (36.4 C), Max:99 F (37.2 C)  Recent Labs  Lab 03/07/21 1202 03/07/21 1412 03/07/21 1835  WBC 7.6  --   --   CREATININE 1.26* 1.03 0.84  LATICACIDVEN 2.1* 1.2  --     Estimated Creatinine Clearance: 94.8 mL/min (by C-G formula based on SCr of 0.84 mg/dL).    Allergies  Allergen Reactions   Penicillins Swelling    REACTION: throat swelling Because of a history of documented adverse serious drug reaction;[name] Alert bracelet  is recommended    Antimicrobials this admission: 12/29 Levaquin >>  Dose adjustments this admission:  Microbiology results: 12/29 BCx: 12/29 COVID: neg 12/29 Flu A/B: neg/neg 12/29 MRSA PCR: sent  Thank you for allowing pharmacy to be a part of this patients care.  Peggyann Juba, PharmD, BCPS Pharmacy: [phone] 03/07/2021 9:14 PM

## 2021-03-07 NOTE — Progress Notes (Signed)
Patient arrived from [place].

## 2021-03-07 NOTE — Telephone Encounter (Signed)
F/U symptoms-  he reports low grade fever  today,  but still coughing.  However he has new itchy, red splotches on arms and new R lower ribcage pain.  I instructed pt to proceed to ED with his chemo card.

## 2021-03-07 NOTE — ED Provider Notes (Signed)
Patient had an episode of reported tachycardia, alarms for ventricular arrhythmias.  EKG was taken which looks really abnormal.  At 1 point he was having bigeminy however there was a lot of artifact and heart rate was jumping all over the place.  EKG leads were changed and this normalized his heart rhythm and EKG.  Unclear if this was genuine arrhythmia but patient remained stable, awake and comfortable throughout this entire episode.  Recommended a repeat BMP to ensure no acute electrolyte changes.  Patient's bed has been assigned and he is up for transfer with [facility], appears stable for transfer.   Lorelle Gibbs, DO 03/07/21 2208

## 2021-03-07 NOTE — ED Triage Notes (Addendum)
Difficult to assess as to chief complaint, [facility] at [facility] sent him to be evaluated for : Dizzines,   Rt Rib Pain  hip Pain Loss of Appetite,  Rash  Cough times 3 days Pt is receiving Chemo and Immunotherapy together. Last treatment 3 weeks ago. Today was to be the final treatment, he was told to come here instead.

## 2021-03-07 NOTE — ED Provider Notes (Signed)
[facility] EMERGENCY DEPT Provider Note   CSN: [id number] Arrival date & time: 03/07/21  1138     History Chief Complaint  Patient presents with   Dizziness   Anorexia   Rash   Rt rib pain    Brandon Rubio. is a 70 y.o. male.   Dizziness Rash  Patient presents to the ER for evaluation of generalized malaise, weakness.  Patient has a history of lung cancer.  He is undergoing chemotherapy treatments.  Last treatment was 3 weeks ago.  He was supposed to be going today but he called the office and they suggested he come to the emergency room.  Patient states he has been having pain in his right rib area recently that has increased.  He has felt feverish.  He denies any vomiting or diarrhea.  Patient denies any increasing shortness of breath.  He has been cutting back on his smoking.  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   Peripheral neuropathy    toes    Patient Active Problem List   Diagnosis Date Noted   DKA (diabetic ketoacidosis) ([place]) 03/07/2021   Encounter for antineoplastic chemotherapy 01/16/2021   Encounter for antineoplastic immunotherapy 01/16/2021   Adenocarcinoma of right lung, stage 3 ([place]) 12/24/2020   Hemoptysis 12/11/2020   Rash 12/11/2020   Right lower lobe lung mass 12/05/2020   Tobacco use 12/05/2020   Insomnia 10/05/2020   Routine general medical examination at a health care facility 08/13/2016   Neck pain 11/26/2012   GERD (gastroesophageal reflux disease) 09/05/2010   Uncontrolled diabetes mellitus with complications 08/67/6195   Smokers' cough ([place]) 09/25/2008   SKIN CANCER, HX OF 09/25/2008    Essential hypertension 03/20/2008   CAD (coronary artery disease) 03/20/2008   Hyperlipidemia associated with type 2 diabetes mellitus ([place]) 12/23/2007    Past Surgical History:  Procedure Laterality Date   BRONCHIAL BIOPSY  12/10/2020   Procedure: BRONCHIAL BIOPSIES;  Surgeon: Collene Gobble, MD;  Location: [facility];  Service: Pulmonary;;   BRONCHIAL BRUSHINGS  12/10/2020   Procedure: BRONCHIAL BRUSHINGS;  Surgeon: Collene Gobble, MD;  Location: [facility];  Service: Pulmonary;;   BRONCHIAL NEEDLE ASPIRATION BIOPSY  12/10/2020   Procedure: BRONCHIAL NEEDLE ASPIRATION BIOPSIES;  Surgeon: Collene Gobble, MD;  Location: [facility] ENDOSCOPY;  Service: Pulmonary;;   COLONOSCOPY     COLONOSCOPY W/ POLYPECTOMY  03/10/2009   LARYNGOSCOPY  03/10/1997   VIDEO BRONCHOSCOPY WITH ENDOBRONCHIAL NAVIGATION N/A 12/10/2020   Procedure: ROBOTIC VIDEO BRONCHOSCOPY WITH ENDOBRONCHIAL NAVIGATION;  Surgeon: Collene Gobble, MD;  Location: [facility] ENDOSCOPY;  Service: Pulmonary;  Laterality: N/A;       Family History  Problem Relation Age of Onset   Lung cancer Mother        X 2   Colon polyps Father    Colon cancer Father 72   Coronary artery disease Father    Lung disease Father    Esophageal cancer Neg Hx    Stomach cancer Neg Hx    Rectal cancer Neg Hx     Social History   Tobacco Use  Smoking status: Every Day    Packs/day: 1.00    Years: 58.00    Pack years: 58.00    Types: Cigarettes   Smokeless tobacco: Never   Tobacco comments:    2+ packs smoked daily ARJ 12/05/20  Vaping Use   Vaping Use: Never used  Substance Use Topics   Alcohol use: No    Alcohol/week: 0.0 standard drinks    Comment:  none since 04/2012   Drug use: Never    Home Medications Prior to Admission medications   Medication Sig Start Date End Date Taking? Authorizing Provider  aspirin EC 81 MG tablet Take 81 mg by mouth in the morning. Swallow whole.   Yes [provider]  empagliflozin (JARDIANCE)  25 MG TABS tablet Take 1 tablet (25 mg total) by mouth daily. 08/07/20  Yes [name], MD  folic acid (FOLVITE) 1 MG tablet Take 1 tablet (1 mg total) by mouth daily. 01/16/21  Yes Curt Bears, MD  Melatonin 5 MG CHEW Chew 5 mg by mouth at bedtime.   Yes [provider]  metFORMIN (GLUCOPHAGE) 1000 MG tablet TAKE 1 TABLET BY MOUTH TWICE DAILY WITH A MEAL 09/14/20  Yes [name], MD  metoprolol succinate (TOPROL-XL) 50 MG 24 hr tablet Take 1 tablet (50 mg total) by mouth daily. Take with or immediately following a meal. 01/18/21  Yes [name], MD  Multiple Vitamin (MULTIVITAMIN WITH MINERALS) TABS tablet Take 1 tablet by mouth daily. Centrum Silver   Yes [provider]  omeprazole (PRILOSEC) 20 MG capsule TAKE 1 CAPSULE(20 MG) BY MOUTH TWICE DAILY BEFORE A MEAL 10/02/20  Yes [name], MD  prochlorperazine (COMPAZINE) 10 MG tablet Take 1 tablet (10 mg total) by mouth every 6 (six) hours as needed for nausea or vomiting. 02/21/21  Yes Curt Bears, MD  simvastatin (ZOCOR) 40 MG tablet Take 1 tablet (40 mg total) by mouth daily at 6 PM. 06/26/20  Yes [name], MD  glucose blood (FREESTYLE LITE) test strip USE TO TEST BLOOD SUGAR TWICE DAILY ICD E11.49 01/18/14   Hendricks Limes, MD  nicotine (NICODERM CQ - DOSED IN MG/24 HOURS) 21 mg/24hr patch Place 1 patch (21 mg total) onto the skin daily. 12/28/20   [name], MD  traZODone (DESYREL) 50 MG tablet Take 1-2 tablets (50-100 mg total) by mouth at bedtime as needed for sleep. Patient taking differently: Take 50-100 mg by mouth at bedtime as needed for sleep. Pt states taking two tablets 12/28/20   [name], MD  triamcinolone cream (KENALOG) 0.1 % Apply 1 application topically 2 (two) times daily as needed (poison ivy). 12/10/20   Collene Gobble, MD    Allergies    Penicillins  Review of Systems   Review of Systems  Skin:  Positive for rash.   Neurological:  Positive for dizziness.  All other systems reviewed and are negative.  Physical Exam Updated Vital Signs BP 126/70    Pulse 99    Temp 97.6 F (36.4 C) (Oral)    Resp (!) 22    Ht 1.829 m (6')    Wt 98.4 kg    SpO2 100%    BMI 29.43 kg/m   Physical Exam Vitals and nursing note reviewed.  Constitutional:      Appearance: He is well-developed. He is not diaphoretic.  HENT:     Head: Normocephalic and atraumatic.     Right Ear: External ear normal.  Left Ear: External ear normal.  Eyes:     General: No scleral icterus.       Right eye: No discharge.        Left eye: No discharge.     Conjunctiva/sclera: Conjunctivae normal.  Neck:     Trachea: No tracheal deviation.  Cardiovascular:     Rate and Rhythm: Regular rhythm. Tachycardia present.  Pulmonary:     Effort: Pulmonary effort is normal. No respiratory distress.     Breath sounds: Normal breath sounds. No stridor. No wheezing or rales.  Abdominal:     General: Bowel sounds are normal. There is no distension.     Palpations: Abdomen is soft.     Tenderness: There is no abdominal tenderness. There is no guarding or rebound.  Musculoskeletal:        General: No tenderness or deformity.     Cervical back: Neck supple.  Skin:    General: Skin is warm and dry.     Findings: No rash.  Neurological:     General: No focal deficit present.     Mental Status: He is alert.     Cranial Nerves: No cranial nerve deficit (no facial droop, extraocular movements intact, no slurred speech).     Sensory: No sensory deficit.     Motor: No abnormal muscle tone or seizure activity.     Coordination: Coordination normal.  Psychiatric:        Mood and Affect: Mood normal.    ED Results / Procedures / Treatments   Labs (all labs ordered are listed, but only abnormal results are displayed) Labs Reviewed  COMPREHENSIVE METABOLIC PANEL - Abnormal; Notable for the following components:      Result Value   Sodium 130 (*)     Chloride 93 (*)    CO2 14 (*)    Glucose, Bld 426 (*)    Creatinine, Ser 1.26 (*)    Total Protein 8.6 (*)    AST 12 (*)    Anion gap 23 (*)    All other components within normal limits  LACTIC ACID, PLASMA - Abnormal; Notable for the following components:   Lactic Acid, Venous 2.1 (*)    All other components within normal limits  CBC WITH DIFFERENTIAL/PLATELET - Abnormal; Notable for the following components:   Monocytes Absolute 1.1 (*)    All other components within normal limits  BASIC METABOLIC PANEL - Abnormal; Notable for the following components:   Sodium 133 (*)    CO2 19 (*)    Glucose, Bld 287 (*)    Calcium 8.5 (*)    All other components within normal limits  I-STAT VENOUS BLOOD GAS, ED - Abnormal; Notable for the following components:   pCO2, Ven 37.4 (*)    pO2, Ven 29.0 (*)    Bicarbonate 18.9 (*)    TCO2 20 (*)    Acid-base deficit 7.0 (*)    Sodium 134 (*)    HCT 33.0 (*)    Hemoglobin 11.2 (*)    All other components within normal limits  CBG MONITORING, ED - Abnormal; Notable for the following components:   Glucose-Capillary 281 (*)    All other components within normal limits  CULTURE, BLOOD (ROUTINE X 2)  CULTURE, BLOOD (ROUTINE X 2)  RESP PANEL BY RT-PCR (FLU A&B, COVID) ARPGX2  LACTIC ACID, PLASMA  PROTIME-INR  URINALYSIS, ROUTINE W REFLEX MICROSCOPIC  BASIC METABOLIC PANEL  BASIC METABOLIC PANEL  BASIC METABOLIC PANEL  BETA-HYDROXYBUTYRIC ACID  BETA-HYDROXYBUTYRIC ACID    EKG None  Radiology DG Chest 2 View  Result Date: 03/07/2021 CLINICAL DATA:  Sepsis.  Cough. EXAM: CHEST - 2 VIEW COMPARISON:  December 11, 2020.  December 06, 2020. FINDINGS: The heart size and mediastinal contours are within normal limits. Left lung is clear. Increased right basilar opacity is noted concerning for increased mass and associated atelectasis or infiltrate. The visualized skeletal structures are unremarkable. IMPRESSION: Increased right basilar opacity is  noted concerning for increased mass and associated atelectasis or infiltrate. Electronically Signed   By: Marijo Conception M.D.   On: 03/07/2021 12:26    Procedures .Critical Care Performed by: Dorie Rank, MD Authorized by: Dorie Rank, MD   Critical care provider statement:    Critical care time (minutes):  30   Critical care was time spent personally by me on the following activities:  Development of treatment plan with patient or surrogate, discussions with consultants, evaluation of patient's response to treatment, examination of patient, ordering and review of laboratory studies, ordering and review of radiographic studies, ordering and performing treatments and interventions, pulse oximetry, re-evaluation of patient's condition and review of old charts   Medications Ordered in ED Medications  insulin regular, human (MYXREDLIN) 100 units/ 100 mL infusion (6 Units/hr Intravenous New Bag/Given 03/07/21 1422)  lactated ringers infusion (has no administration in time range)  dextrose 5 % in lactated ringers infusion (has no administration in time range)  dextrose 50 % solution 0-50 mL (has no administration in time range)  potassium chloride 10 mEq in 100 mL IVPB (10 mEq Intravenous New Bag/Given 03/07/21 1425)  levofloxacin (LEVAQUIN) IVPB 500 mg (0 mg Intravenous Stopped 03/07/21 1416)  morphine 4 MG/ML injection 4 mg (4 mg Intravenous Given 03/07/21 1255)  sodium chloride 0.9 % bolus 2,952 mL (3,000 mLs Intravenous New Bag/Given 03/07/21 1300)  iohexol (OMNIPAQUE) 300 MG/ML solution 75 mL (75 mLs Intravenous Contrast Given 03/07/21 1440)    ED Course  I have reviewed the triage vital signs and the nursing notes.  Pertinent labs & imaging results that were available during my care of the patient were reviewed by me and considered in my medical decision making (see chart for details).  Clinical Course as of 03/07/21 1458  Thu Mar 07, 2021  1231 CXR shows increasing mass and or  infiltrate RLL [JK]  1238 Creatinine increased compared to previous [JK]  1342 Labs show hyperglycemia.  Patient has an elevated anion gap acidosis.  Concerning for possible DKA [JK]  1342 Lactic acid level elevated but no hypotension or leukocytosis [JK]  1458 D/w Dr Posey Pronto [JK]    Clinical Course User Index [JK] Dorie Rank, MD   MDM Rules/Calculators/A&P                         Patient presented to the ED for evaluation of dizziness loss of appetite cough.  Patient has noticed of lung cancer.  ED work-up shows increasing airspace opacity in the area of his lung cancer.  Concerning for possible superimposed infection.  Patient's labs are notable for hyperglycemia and metabolic acidosis consistent with DKA.  Patient has slightly elevated lactic acid level but has remained normotensive.  Patient started on IV fluids as well as insulin drip.  He was also started on antibiotics.  I will order CT scan of his chest to assess the mass area further.  I will consult the medical service for admission further treatment.  Patient will require  transfer from this freestanding emergency room.    Final Clinical Impression(s) / ED Diagnoses Final diagnoses:  Diabetic ketoacidosis without coma associated with type 1 diabetes mellitus ([place])  Malignant neoplasm of right lung, unspecified part of lung ([place])     Dorie Rank, MD 03/07/21 1459

## 2021-03-08 DIAGNOSIS — E1110 Type 2 diabetes mellitus with ketoacidosis without coma: Secondary | ICD-10-CM | POA: Diagnosis not present

## 2021-03-08 LAB — BASIC METABOLIC PANEL
Anion gap: 9 (ref 5–15)
BUN: 13 mg/dL (ref 8–23)
CO2: 22 mmol/L (ref 22–32)
Calcium: 8.2 mg/dL — ABNORMAL LOW (ref 8.9–10.3)
Chloride: 105 mmol/L (ref 98–111)
Creatinine, Ser: 0.75 mg/dL (ref 0.61–1.24)
GFR, Estimated: >60 mL/min (ref >60)
Glucose, Bld: 107 mg/dL — ABNORMAL HIGH (ref 70–99)
Potassium: 3.5 mmol/L (ref 3.5–5.1)
Sodium: 136 mmol/L (ref 135–145)

## 2021-03-08 LAB — CBC WITH DIFFERENTIAL/PLATELET
Abs Immature Granulocytes: 0.06 10*3/uL (ref 0.00–0.07)
Basophils Absolute: 0 10*3/uL (ref 0.0–0.1)
Basophils Relative: 1 %
Eosinophils Absolute: 0.1 10*3/uL (ref 0.0–0.5)
Eosinophils Relative: 1 %
HCT: 30.8 % — ABNORMAL LOW (ref 39.0–52.0)
Hemoglobin: 10.5 g/dL — ABNORMAL LOW (ref 13.0–17.0)
Immature Granulocytes: 1 %
Lymphocytes Relative: 30 %
Lymphs Abs: 1.7 10*3/uL (ref 0.7–4.0)
MCH: 30.9 pg (ref 26.0–34.0)
MCHC: 34.1 g/dL (ref 30.0–36.0)
MCV: 90.6 fL (ref 80.0–100.0)
Monocytes Absolute: 0.9 10*3/uL (ref 0.1–1.0)
Monocytes Relative: 17 %
Neutro Abs: 2.8 10*3/uL (ref 1.7–7.7)
Neutrophils Relative %: 50 %
Platelets: 218 10*3/uL (ref 150–400)
RBC: 3.4 MIL/uL — ABNORMAL LOW (ref 4.22–5.81)
RDW: 14.7 % (ref 11.5–15.5)
WBC: 5.6 10*3/uL (ref 4.0–10.5)
nRBC: 0 % (ref 0.0–0.2)

## 2021-03-08 LAB — GLUCOSE, CAPILLARY
Glucose-Capillary: 104 mg/dL — ABNORMAL HIGH (ref 70–99)
Glucose-Capillary: 94 mg/dL (ref 70–99)
Glucose-Capillary: 129 mg/dL — ABNORMAL HIGH (ref 70–99)
Glucose-Capillary: 124 mg/dL — ABNORMAL HIGH (ref 70–99)
Glucose-Capillary: 145 mg/dL — ABNORMAL HIGH (ref 70–99)
Glucose-Capillary: 134 mg/dL — ABNORMAL HIGH (ref 70–99)
Glucose-Capillary: 187 mg/dL — ABNORMAL HIGH (ref 70–99)
Glucose-Capillary: 205 mg/dL — ABNORMAL HIGH (ref 70–99)
Glucose-Capillary: 141 mg/dL — ABNORMAL HIGH (ref 70–99)

## 2021-03-08 LAB — MAGNESIUM: Magnesium: 1.9 mg/dL (ref 1.7–2.4)

## 2021-03-08 LAB — PROCALCITONIN: Procalcitonin: 1.55 ng/mL

## 2021-03-08 LAB — PHOSPHORUS
Phosphorus: 1.4 mg/dL — ABNORMAL LOW (ref 2.5–4.6)
Phosphorus: 3.2 mg/dL (ref 2.5–4.6)

## 2021-03-08 LAB — BETA-HYDROXYBUTYRIC ACID
Beta-Hydroxybutyric Acid: 0.85 mmol/L — ABNORMAL HIGH (ref 0.05–0.27)
Beta-Hydroxybutyric Acid: 1.05 mmol/L — ABNORMAL HIGH (ref 0.05–0.27)

## 2021-03-08 LAB — HEMOGLOBIN A1C
Hgb A1c MFr Bld: 9.2 % — ABNORMAL HIGH (ref 4.8–5.6)
Mean Plasma Glucose: 217.34 mg/dL

## 2021-03-08 MED ORDER — INSULIN ASPART 100 UNIT/ML IJ SOLN
5.0000 [IU] | Freq: Three times a day (TID) | INTRAMUSCULAR | Status: DC
  Administered 2021-03-08: 09:00:00 5 [IU] via SUBCUTANEOUS

## 2021-03-08 MED ORDER — POTASSIUM PHOSPHATES 15 MMOLE/5ML IV SOLN
30.0000 mmol | Freq: Once | INTRAVENOUS | Status: AC
  Administered 2021-03-08: 14:00:00 30 mmol via INTRAVENOUS
  Filled 2021-03-08: qty 10

## 2021-03-08 MED ORDER — INSULIN GLARGINE-YFGN 100 UNIT/ML ~~LOC~~ SOLN
15.0000 [IU] | SUBCUTANEOUS | Status: DC
  Administered 2021-03-08 – 2021-03-09 (×2): 15 [IU] via SUBCUTANEOUS
  Filled 2021-03-08 (×3): qty 0.15

## 2021-03-08 MED ORDER — LIVING WELL WITH DIABETES BOOK
Freq: Once | Status: AC
  Filled 2021-03-08 (×2): qty 1

## 2021-03-08 MED ORDER — INSULIN STARTER KIT- PEN NEEDLES (ENGLISH)
1.0000 | Freq: Once | Status: AC
  Administered 2021-03-08: 14:00:00 1
  Filled 2021-03-08: qty 1

## 2021-03-08 MED ORDER — INSULIN ASPART 100 UNIT/ML IJ SOLN
0.0000 [IU] | Freq: Three times a day (TID) | INTRAMUSCULAR | Status: DC
  Administered 2021-03-08: 22:00:00 2 [IU] via SUBCUTANEOUS
  Administered 2021-03-08: 12:00:00 3 [IU] via SUBCUTANEOUS
  Administered 2021-03-08: 09:00:00 2 [IU] via SUBCUTANEOUS
  Administered 2021-03-08: 18:00:00 5 [IU] via SUBCUTANEOUS
  Administered 2021-03-09: 3 [IU] via SUBCUTANEOUS

## 2021-03-08 NOTE — Plan of Care (Signed)
°  Problem: Education: Goal: Ability to describe self-care measures that may prevent or decrease complications (Diabetes Survival Skills Education) will improve Outcome: Progressing Goal: Individualized Educational Video(s) Outcome: Progressing   Problem: Cardiac: Goal: Ability to maintain an adequate cardiac output will improve Outcome: Progressing   Problem: Health Behavior/Discharge Planning: Goal: Ability to identify and utilize available resources and services will improve Outcome: Progressing

## 2021-03-08 NOTE — Progress Notes (Signed)
°  Transition of Care [facility]) Screening Note   Patient Details  Name: Brandon Rubio. Date of Birth: 1950-05-13   [facility] of [facility]) CM/SW Contact:    Liberta Gimpel, Marjie Skiff, RN Phone Number: 03/08/2021, 10:16 AM    Transition of Care Department [facility]) has reviewed patient and no TOC needs have been identified at this time. We will continue to monitor patient advancement through interdisciplinary progression rounds. If new patient transition needs arise, please place a TOC consult.

## 2021-03-08 NOTE — Progress Notes (Signed)
Inpatient Diabetes Program Recommendations  AACE/ADA: New Consensus Statement on Inpatient Glycemic Control (2015)  Target Ranges:  Prepandial:   less than 140 mg/dL      Peak postprandial:   less than 180 mg/dL (1-2 hours)      Critically ill patients:  140 - 180 mg/dL   Lab Results  Component Value Date   GLUCAP 187 (H) 03/08/2021   HGBA1C 9.2 (H) 03/07/2021    Review of Glycemic Control  Diabetes history: DM2 Outpatient Diabetes medications: Jardiance 25 mg QD, metformin 1000 mg BID Current orders for Inpatient glycemic control: Semglee 15 units Q24H, Novolog 0-15 units TID with meals and 0-5 HS  HgbA1C - 9.2% (average blood sugar of 217 mg/dL)  Inpatient Diabetes Program Recommendations:    Insulin just started this am after coming off insulin drip. Received Novolog meal coverage of 5 units at breakfast and meal coverage then discontinued.   Restart Novolog meal coverage at 4 units TID with meals.  Educated patient and spouse on insulin pen use at home. Reviewed contents of insulin flexpen starter kit. Reviewed all steps if insulin pen including attachment of needle, 2-unit air shot, dialing up dose, giving injection, removing needle, disposal of sharps, storage of unused insulin, disposal of insulin etc. Patient able to provide successful return demonstration. Also reviewed troubleshooting with insulin pen. MD to give patient Rxs for insulin pens and insulin pen needles.   Discussed nutrition, monitoring, hypoglycemia s/s and treatment, insulin administration and importance of f/u with PCP. Pt feels good about going home on insulin. Will start checking blood sugars 2-3x/day. Continue to follow while inpatient. Answered questions. Very appreciative of information and visit.   Thank you. Lorenda Peck, RD, LDN, CDE Inpatient Diabetes Coordinator [phone]

## 2021-03-08 NOTE — Progress Notes (Signed)
HOSPITAL MEDICINE OVERNIGHT EVENT NOTE    Notified by nursing she has received ENDOTOOL alert that patient is to be transitioned off of insulin infusion.  Chart reviewed.  Gap is closed for the last two chemistries.  Patient not on scheduled basal insulin at home - will place on approx 0.3 units per kg of basal/bolus insulin therapy.  This will include 15 of basal insulin daily and 5 units before each meal.    Cardiac/Diabetic diet additionally started.  Insulin infusion to be stopped 2 hrs after administration of basal insulin administration.     Initiating accuchecks QAC and QHS with sliding scale insulin.   Vernelle Emerald  MD [facility]

## 2021-03-08 NOTE — Progress Notes (Signed)
PROGRESS NOTE    Brandon Rubio.  KGU:[id number] DOB: 06-21-1950 DOA: 03/07/2021 PCP: [name], MD   Chief Complaint  Patient presents with   Dizziness   Anorexia   Rash   Rt rib pain  Brief Narrative/Hospital Course: Brandon Rubio., 70 y.o. male with PMH of stage III non-small cell lung cancer undergoing neoadjuvant chemo therapy every 3 weeks, due to DM, HLD, chronic tobacco use presented to the [facility] ED with progressive shortness of breath, nonproductive cough subjective fever chest wall discomfort. He was seen in the ED vitals are stable, labs with anion gap metabolic acidosis given IV fluid bolus placed on insulin drip and admitted.  Underwent CT scan of the chest that showed " Interval increased size of right lower lobe mass with new possible extension into the adjacent right upper lobe. 2. New enlargement of right hilar and mediastinal lymph nodes, concerning for metastatic disease"  Subjective: Seen and examined this morning patient is alert oriented, about daughter's name, transitioned to subcu Lantus this morning. Afebrile overnight Assessment & Plan:  DKA in the setting of T2DM: Anion gap closed, transition to subcu Lantus and Premeal insulin.  We will do insulin education teaching monitor CBG.  Lightheadedness started this morning. Recent Labs  Lab 03/08/21 0221 03/08/21 0327 03/08/21 0431 03/08/21 0641 03/08/21 0756  GLUCAP 104* 129* 124* 145* 134*   Hypophosphatemia 1.4 will replete  HLD hyperlipidemia Essential hypertension CAD: He has had no chest pain, blood pressure is stable continue aspirin, metoprolol, statin  GERD: Continue PPI  Stage III non-small cell lung cancer undergoing neoadjuvant chemo therapy every 3 weeks-was due for chemo yesterday.  CT chest this admission showing worsening of his cancer will alert Dr. Julien Nordmann.   ?Postobstructive pneumonia given slightly elevated procalcitonin continue on empiric antibiotics.  Overall  he is afebrile  Pseudohyponatremia: Sodium improved Dehydration now placed on diet of IV fluids  DVT prophylaxis: SCDs Start: 03/07/21 2043 Code Status:   Code Status: Full Code Family Communication: plan of care discussed with patient at bedside. Status is: Inpatient Remains inpatient appropriate because: For ongoing management of uncontrolled hyperglycemia needing insulin Disposition: Currently not medically stable for discharge. Anticipated Disposition: Home likely tomorrow.  Okay to transfer out of stepdown today   Objective: Vitals last 24 hrs: Vitals:   03/08/21 0600 03/08/21 0700 03/08/21 0800 03/08/21 0824  BP: (!) 136/55 130/66 116/62   Pulse: 91 95 92   Resp:   (!) 21   Temp:    98.1 F (36.7 C)  TempSrc:    Oral  SpO2: 94% 95% 92%   Weight:      Height:       Weight change:   Intake/Output Summary (Last 24 hours) at 03/08/2021 1026 Last data filed at 03/08/2021 0800 Gross per 24 hour  Intake 5146.53 ml  Output 300 ml  Net 4846.53 ml   Net IO Since Admission: 4,846.53 mL [03/08/21 1026]   Physical Examination: General exam: AA0x3, weak,older than stated age. HEENT:Oral mucosa moist, Ear/Nose WNL grossly,dentition normal. Respiratory system: B/l diminished BS, no use of accessory muscle, non tender. Cardiovascular system: S1 & S2 +,No JVD. Gastrointestinal system: Abdomen soft, NT,ND, BS+. Nervous System:Alert, awake, moving extremities. Extremities: edema none, distal peripheral pulses palpable.  Skin: No rashes, no icterus. MSK: Normal muscle bulk, tone, power.  Medications reviewed:  Scheduled Meds:  aspirin EC  81 mg Oral Daily   Chlorhexidine Gluconate Cloth  6 each Topical Daily   insulin aspart  0-15 Units Subcutaneous TID AC & HS   insulin glargine-yfgn  15 Units Subcutaneous Q24H   insulin starter kit- pen needles  1 kit Other Once   living well with diabetes book   Does not apply Once   metoprolol succinate  50 mg Oral Daily   multivitamin  with minerals  1 tablet Oral Daily   pantoprazole  40 mg Oral Daily   simvastatin  40 mg Oral q1800   Continuous Infusions:  levofloxacin (LEVAQUIN) IV     potassium PHOSPHATE IVPB (in mmol)      Diet Order             Diet heart healthy/carb modified Room service appropriate? Yes; Fluid consistency: Thin  Diet effective now                          Weight change:   Wt Readings from Last 3 Encounters:  03/07/21 94.4 kg  02/14/21 98.7 kg  02/06/21 98.4 kg     Consultants:see note  Procedures:see note Antimicrobials: Anti-infectives (From admission, onward)    Start     Dose/Rate Route Frequency Ordered Stop   03/08/21 1000  levofloxacin (LEVAQUIN) IVPB 750 mg        750 mg 100 mL/hr over 90 Minutes Intravenous Every 24 hours 03/07/21 2112     03/07/21 1245  levofloxacin (LEVAQUIN) IVPB 500 mg        500 mg 100 mL/hr over 60 Minutes Intravenous  Once 03/07/21 1234 03/07/21 1416      Culture/Microbiology    Component Value Date/Time   SDES  03/07/2021 1205    BLOOD RIGHT FOREARM Performed at [facility], [address], [place], [place] [address]    [facility]  03/07/2021 1205    Blood Culture results may not be optimal due to an excessive volume of blood received in culture bottles BOTTLES DRAWN AEROBIC AND ANAEROBIC   CULT  03/07/2021 1205    NO GROWTH < 24 HOURS Performed at [facility] [address] [place], [place] [address]    REPTSTATUS PENDING 03/07/2021 1205    Other culture-see note  Unresulted Labs (From admission, onward)     Start     Ordered   03/08/21 2000  phosphorus level  (Serum Phosphorus)  Once-Timed,   TIMED       Question:  Specimen collection method  Answer:  Lab=Lab collect   03/08/21 1026   03/07/21 2111  Strep pneumoniae urinary antigen  Once,   R        03/07/21 2111          Data Reviewed: I have personally reviewed following labs and imaging studies CBC: Recent Labs  Lab  03/07/21 1202 03/07/21 1415 03/08/21 0240  WBC 7.6  --  5.6  NEUTROABS 5.3  --  2.8  HGB 13.2 11.2* 10.5*  HCT 39.6 33.0* 30.8*  MCV 90.0  --  90.6  PLT 279  --  876   Basic Metabolic Panel: Recent Labs  Lab 03/07/21 1202 03/07/21 1412 03/07/21 1415 03/07/21 1835 03/07/21 2131 03/08/21 0240  NA 130* 133* 134* 135 133* 136  K 4.0 4.1 3.9 3.8 3.6 3.5  CL 93* 99  --  102 105 105  CO2 14* 19*  --  22 20* 22  GLUCOSE 426* 287*  --  208* 152* 107*  BUN 19 18  --  _0 CREATININE 1.26* 1.03  --  0.84 0.71 0.75  CALCIUM 10.0 8.5*  --  8.4* 8.2* 8.2*  MG  --   --   --   --   --  1.9  PHOS  --   --   --   --   --  1.4*   GFR: Estimated Creatinine Clearance: 99.5 mL/min (by C-G formula based on SCr of 0.75 mg/dL). Liver Function Tests: Recent Labs  Lab 03/07/21 1202  AST 12*  ALT 20  ALKPHOS 67  BILITOT 0.8  PROT 8.6*  ALBUMIN 4.2   No results for input(s): LIPASE, AMYLASE in the last 168 hours. No results for input(s): AMMONIA in the last 168 hours. Coagulation Profile: Recent Labs  Lab 03/07/21 1202  INR 1.1   Cardiac Enzymes: No results for input(s): CKTOTAL, CKMB, CKMBINDEX, TROPONINI in the last 168 hours. BNP (last 3 results) No results for input(s): PROBNP in the last 8760 hours. HbA1C: Recent Labs    03/07/21 2131  HGBA1C 9.2*   CBG: Recent Labs  Lab 03/08/21 0221 03/08/21 0327 03/08/21 0431 03/08/21 0641 03/08/21 0756  GLUCAP 104* 129* 124* 145* 134*   Lipid Profile: No results for input(s): CHOL, HDL, LDLCALC, TRIG, CHOLHDL, LDLDIRECT in the last 72 hours. Thyroid Function Tests: No results for input(s): TSH, T4TOTAL, FREET4, T3FREE, THYROIDAB in the last 72 hours. Anemia Panel: No results for input(s): VITAMINB12, FOLATE, FERRITIN, TIBC, IRON, RETICCTPCT in the last 72 hours. Sepsis Labs: Recent Labs  Lab 03/07/21 1202 03/07/21 1412 03/07/21 2131  PROCALCITON  --   --  1.55  LATICACIDVEN 2.1* 1.2  --     Recent Results (from  the past 240 hour(s))  Culture, blood (Routine x 2)     Status: None (Preliminary result)   Collection Time: 03/07/21 12:00 PM   Specimen: BLOOD  Result Value Ref Range Status   Specimen Description   Final    BLOOD LEFT ANTECUBITAL Performed at [facility], [address], [place], [place] [address]    Special Requests   Final    Blood Culture adequate volume Performed at [facility], [address], [place], [place] [address]    Culture   Final    NO GROWTH < 24 HOURS Performed at [facility], [place] [address], [place], [place] [address]    Report Status PENDING  Incomplete  Culture, blood (Routine x 2)     Status: None (Preliminary result)   Collection Time: 03/07/21 12:05 PM   Specimen: BLOOD RIGHT FOREARM  Result Value Ref Range Status   Specimen Description   Final    BLOOD RIGHT FOREARM Performed at [facility], [address], [place], [place] [address]    Special Requests   Final    Blood Culture results may not be optimal due to an excessive volume of blood received in culture bottles BOTTLES DRAWN AEROBIC AND ANAEROBIC   Culture   Final    NO GROWTH < 24 HOURS Performed at [facility], [place] [address], [place], [place] [address]    Report Status PENDING  Incomplete  Resp Panel by RT-PCR (Flu A&B, Covid) Nasopharyngeal Swab     Status: None   Collection Time: 03/07/21  2:28 PM   Specimen: Nasopharyngeal Swab; Nasopharyngeal(NP) swabs in vial transport medium  Result Value Ref Range Status   SARS Coronavirus 2 by RT PCR NEGATIVE NEGATIVE Final    Comment: (NOTE) SARS-CoV-2 target nucleic acids are NOT DETECTED.  The SARS-CoV-2 RNA is  generally detectable in upper respiratory specimens during the acute phase of infection. The lowest concentration of SARS-CoV-2 viral copies this assay can detect is 138 copies/mL. A negative result does not preclude SARS-Cov-2 infection and should not be used  as the sole basis for treatment or other patient management decisions. A negative result may occur with  improper specimen collection/handling, submission of specimen other than nasopharyngeal swab, presence of viral mutation(s) within the areas targeted by this assay, and inadequate number of viral copies(<138 copies/mL). A negative result must be combined with clinical observations, patient history, and epidemiological information. The expected result is Negative.  Fact Sheet for Patients:  EntrepreneurPulse.com.au  Fact Sheet for Healthcare Providers:  IncredibleEmployment.be  This test is no t yet approved or cleared by the [place] FDA and  has been authorized for detection and/or diagnosis of SARS-CoV-2 by FDA under an Emergency Use Authorization (EUA). This EUA will remain  in effect (meaning this test can be used) for the duration of the COVID-19 declaration under Section 564(b)(1) of the Act, 21 U.S.C.section 360bbb-3(b)(1), unless the authorization is terminated  or revoked sooner.       Influenza A by PCR NEGATIVE NEGATIVE Final   Influenza B by PCR NEGATIVE NEGATIVE Final    Comment: (NOTE) The Xpert Xpress SARS-CoV-2/FLU/RSV plus assay is intended as an aid in the diagnosis of influenza from Nasopharyngeal swab specimens and should not be used as a sole basis for treatment. Nasal washings and aspirates are unacceptable for Xpert Xpress SARS-CoV-2/FLU/RSV testing.  Fact Sheet for Patients: EntrepreneurPulse.com.au  Fact Sheet for Healthcare Providers: IncredibleEmployment.be  This test is not yet approved or cleared by the [place] FDA and has been authorized for detection and/or diagnosis of SARS-CoV-2 by FDA under an Emergency Use Authorization (EUA). This EUA will remain in effect (meaning this test can be used) for the duration of the COVID-19 declaration under Section 564(b)(1)  of the Act, 21 U.S.C. section 360bbb-3(b)(1), unless the authorization is terminated or revoked.  Performed at [facility], [address], [place], [place] [address]   MRSA Next Gen by PCR, Nasal     Status: None   Collection Time: 03/07/21  8:16 PM   Specimen: Nasal Mucosa; Nasal Swab  Result Value Ref Range Status   MRSA by PCR Next Gen NOT DETECTED NOT DETECTED Final    Comment: (NOTE) The GeneXpert MRSA Assay (FDA approved for NASAL specimens only), is one component of a comprehensive MRSA colonization surveillance program. It is not intended to diagnose MRSA infection nor to guide or monitor treatment for MRSA infections. Test performance is not FDA approved in patients less than 53 years old. Performed at [facility], [place] [address] [place], [place] [address]      Radiology Studies: DG Chest 2 View  Result Date: 03/07/2021 CLINICAL DATA:  Sepsis.  Cough. EXAM: CHEST - 2 VIEW COMPARISON:  December 11, 2020.  December 06, 2020. FINDINGS: The heart size and mediastinal contours are within normal limits. Left lung is clear. Increased right basilar opacity is noted concerning for increased mass and associated atelectasis or infiltrate. The visualized skeletal structures are unremarkable. IMPRESSION: Increased right basilar opacity is noted concerning for increased mass and associated atelectasis or infiltrate. Electronically Signed   By: Marijo Conception M.D.   On: 03/07/2021 12:26   CT Chest W Contrast  Result Date: 03/07/2021 CLINICAL DATA:  History of non-small cell lung cancer EXAM: CT CHEST WITH CONTRAST TECHNIQUE: Multidetector CT  imaging of the chest was performed during intravenous contrast administration. CONTRAST:  41m OMNIPAQUE IOHEXOL 300 MG/ML  SOLN COMPARISON:  CT chest dated November 16, 2020 FINDINGS: Cardiovascular: Normal heart size. No pericardial effusion. Atherosclerotic disease of the thoracic aorta. Three-vessel  coronary artery calcifications. Mediastinum/Nodes: New enlargement of hilar and mediastinal lymph nodes, increased in size when compared with prior exam. Reference subcarinal lymph node measuring 1.4 cm in short axis on series 2, image 70. Reference right hilar lymph node measuring 1.4 cm in short axis on series 2, image 74. Lungs/Pleura: Central airways are patent. Centrilobular emphysema. Interval increased size of right lower lobe mass, measures up to 9.5 x 6.0 cm in greatest axial dimension, previously 7.8 x 5.5 cm. Central hyperdensity is again seen within the mass, similar to prior exams. New ground-glass opacities are seen in the adjacent right lower lobe, concerning for tumor extension. Stable solid pulmonary nodule of the right upper lobe measuring 7 mm on series 4, image 80. Upper Abdomen: Low-attenuation lesion of the left kidney, likely a simple cyst. No acute abnormality. Musculoskeletal: No chest wall abnormality. No acute or significant osseous findings. IMPRESSION: 1. Interval increased size of right lower lobe mass with new possible extension into the adjacent right upper lobe. 2. New enlargement of right hilar and mediastinal lymph nodes, concerning for metastatic disease. 3. Aortic Atherosclerosis (ICD10-I70.0) and Emphysema (ICD10-J43.9). Electronically Signed   By: LYetta GlassmanM.D.   On: 03/07/2021 15:10     LOS: 1 day   RAntonieta Pert MD [facility]  03/08/2021, 10:26 AM

## 2021-03-09 DIAGNOSIS — E1110 Type 2 diabetes mellitus with ketoacidosis without coma: Secondary | ICD-10-CM | POA: Diagnosis not present

## 2021-03-09 LAB — GLUCOSE, CAPILLARY
Glucose-Capillary: 159 mg/dL — ABNORMAL HIGH (ref 70–99)
Glucose-Capillary: 173 mg/dL — ABNORMAL HIGH (ref 70–99)

## 2021-03-09 MED ORDER — DOXYCYCLINE MONOHYDRATE 100 MG PO TABS: 100.0000 mg | ORAL_TABLET | Freq: Two times a day (BID) | ORAL | 0 refills | Status: AC

## 2021-03-09 MED ORDER — BLOOD GLUCOSE MONITOR KIT: PACK | 0 refills | Status: DC

## 2021-03-09 MED ORDER — INSULIN PEN NEEDLE 31G X 5 MM MISC: 1.0000 | Freq: Every day | 0 refills | Status: AC

## 2021-03-09 MED ORDER — INSULIN GLARGINE 100 UNIT/ML SOLOSTAR PEN: 15.0000 [IU] | PEN_INJECTOR | Freq: Every day | SUBCUTANEOUS | 1 refills | Status: DC

## 2021-03-09 NOTE — Discharge Summary (Signed)
Physician Discharge Summary  Brandon Rubio. PQZ:[id number] DOB: 1951/03/03 DOA: 03/07/2021  PCP: Brandon Koch, MD  Admit date: 03/07/2021 Discharge date: 03/09/2021  Admitted From: home Disposition:  home  Recommendations for Outpatient Follow-up:  Follow up with PCP in 1-2 weeks Please obtain BMP/CBC in one week  Home Health:no  Equipment/Devices: no  Discharge Condition: Stable Code Status:   Code Status: Full Code Diet recommendation:  Diet Order             Diet heart healthy/carb modified Room service appropriate? Yes; Fluid consistency: Thin  Diet effective now                    Brief/Interim Summary:  70 y.o. male with PMH of stage III non-small cell lung cancer undergoing neoadjuvant chemo therapy every 3 weeks, due to DM, HLD, chronic tobacco use presented to the [facility] with progressive shortness of breath, nonproductive cough subjective fever chest wall discomfort. He was seen in the ED vitals are stable, labs with anion gap metabolic acidosis given IV fluid bolus placed on insulin drip and admitted.  Underwent CT scan of the chest that showed " Interval increased size of right lower lobe mass with new possible extension into the adjacent right upper lobe. 2. New enlargement of right hilar and mediastinal lymph nodes, concerning for metastatic disease"   Discharge Diagnoses:  DKA in the setting of T2DM: Anion gap closed, transitionedto subcu Lantus and Premeal insulin-April appears to be an onset injection so we will discharge him on Lantus only, he will follow-up with PCP monitor CBG 3 times daily, did insulin teaching and also educated on hypoglycemia Recent Labs  Lab 03/08/21 0756 03/08/21 1208 03/08/21 1723 03/08/21 2127 03/09/21 0737  GLUCAP 134* 187* 205* 141* 159*   Hypophosphatemia 1.4 resolved  HLD  Essential hypertension CAD: He has had no chest pain, blood pressure is stable continue aspirin, metoprolol, statin  GERD: Continue  PPI  Stage III non-small cell lung cancer undergoing neoadjuvant chemo therapy every 3 weeks-was due for chemo yesterday.  CT chest this admission showing worsening of his cancer will alert Dr. Brunilda Rubio is aware that he needs to call his office on Monday.   ?Postobstructive pneumonia given slightly elevated procalcitonin continue on empiric antibiotics.  Overall he is afebrile.  Pneumonia ruled out  Pseudohyponatremia: Sodium improved Dehydration resolved    Consults: DM Cordinator  Subjective: Oriented resting comfortably Requesting to go home today Comfortable with insulin injection  Discharge Exam: Vitals:   03/08/21 2125 03/09/21 0230  BP: 135/86 137/73  Pulse: 90 84  Resp: 17 18  Temp: 97.6 F (36.4 C) 98.2 F (36.8 C)  SpO2: 93% 92%   General: Pt is alert, awake, not in acute distress Cardiovascular: RRR, S1/S2 +, no rubs, no gallops Respiratory: CTA bilaterally, no wheezing, no rhonchi Abdominal: Soft, NT, ND, bowel sounds + Extremities: no edema, no cyanosis  Discharge Instructions  Discharge Instructions     Discharge instructions   Complete by: As directed    Please call call MD or return to ER for similar or worsening recurring problem that brought you to hospital or if any fever,nausea/vomiting,abdominal pain, uncontrolled pain, chest pain,  shortness of breath or any other alarming symptoms.  Please follow-up your doctor as instructed in a week time and call the office for appointment.  Please avoid alcohol, smoking, or any other illicit substance and maintain healthy habits including taking your regular medications as prescribed.  You were cared  for by a hospitalist during your hospital stay. If you have any questions about your discharge medications or the care you received while you were in the hospital after you are discharged, you can call the unit and ask to speak with the hospitalist on call if the hospitalist that took care of you is not  available.  Once you are discharged, your primary care physician will handle any further medical issues. Please note that NO REFILLS for any discharge medications will be authorized once you are discharged, as it is imperative that you return to your primary care physician (or establish a relationship with a primary care physician if you do not have one) for your aftercare needs so that they can reassess your need for medications and monitor your lab values    Check blood sugar 3 times a day and bedtime at home. If blood sugar running above 200 less than 70 please call your MD to adjust insulin. If blood sugars running less 100 do not use insulin and call MD. If you noticed signs and symptoms of hypoglycemia or low blood sugar like jitteriness, confusion, thirst, tremor, sweating- Check blood sugar, drink sugary drink/biscuits/sweets to increase sugar level and call MD or return to ER.   Increase activity slowly   Complete by: As directed       Allergies as of 03/09/2021       Reactions   Penicillins Swelling   REACTION: throat swelling Because of a history of documented adverse serious drug reaction;[name] Alert bracelet  is recommended        Medication List     TAKE these medications    aspirin EC 81 MG tablet Take 81 mg by mouth in the morning. Swallow whole.   blood glucose meter kit and supplies Kit Dispense based on patient and insurance preference. Use up to four times daily as directed.   doxycycline 100 MG tablet Commonly known as: ADOXA Take 1 tablet (100 mg total) by mouth 2 (two) times daily for 4 days.   empagliflozin 25 MG Tabs tablet Commonly known as: JARDIANCE Take 1 tablet (25 mg total) by mouth daily.   folic acid 1 MG tablet Commonly known as: FOLVITE Take 1 tablet (1 mg total) by mouth daily.   glucose blood test strip Commonly known as: FREESTYLE LITE USE TO TEST BLOOD SUGAR TWICE DAILY ICD E11.49   insulin glargine 100 UNIT/ML Solostar  Pen Commonly known as: LANTUS Inject 15 Units into the skin daily.   Insulin Pen Needle 31G X 5 MM Misc 1 each by Does not apply route daily at 6 (six) AM.   Melatonin 5 MG Chew Chew 5 mg by mouth at bedtime.   metFORMIN 1000 MG tablet Commonly known as: GLUCOPHAGE TAKE 1 TABLET BY MOUTH TWICE DAILY WITH A MEAL   metoprolol succinate 50 MG 24 hr tablet Commonly known as: TOPROL-XL Take 1 tablet (50 mg total) by mouth daily. Take with or immediately following a meal.   multivitamin with minerals Tabs tablet Take 1 tablet by mouth daily. Centrum Silver   nicotine 21 mg/24hr patch Commonly known as: NICODERM CQ - dosed in mg/24 hours Place 1 patch (21 mg total) onto the skin daily.   omeprazole 20 MG capsule Commonly known as: PRILOSEC TAKE 1 CAPSULE(20 MG) BY MOUTH TWICE DAILY BEFORE A MEAL   prochlorperazine 10 MG tablet Commonly known as: COMPAZINE Take 1 tablet (10 mg total) by mouth every 6 (six) hours as needed for nausea or  vomiting.   simvastatin 40 MG tablet Commonly known as: ZOCOR Take 1 tablet (40 mg total) by mouth daily at 6 PM.   traZODone 50 MG tablet Commonly known as: DESYREL Take 1-2 tablets (50-100 mg total) by mouth at bedtime as needed for sleep. What changed: additional instructions   triamcinolone cream 0.1 % Commonly known as: KENALOG Apply 1 application topically 2 (two) times daily as needed (poison ivy).        Follow-up Information     Brandon Koch, MD Follow up in 1 week(s).   Specialty: Internal Medicine Contact information: [place] [phone]         Curt Bears, MD Follow up in 1 week(s).   Specialty: Oncology Contact information: [place] [phone]                Allergies  Allergen Reactions   Penicillins Swelling    REACTION: throat swelling Because of a history of documented adverse serious drug reaction;[name] Alert  bracelet  is recommended    The results of significant diagnostics from this hospitalization (including imaging, microbiology, ancillary and laboratory) are listed below for reference.    Microbiology: Recent Results (from the past 240 hour(s))  Culture, blood (Routine x 2)     Status: None (Preliminary result)   Collection Time: 03/07/21 12:00 PM   Specimen: BLOOD  Result Value Ref Range Status   Specimen Description   Final    BLOOD LEFT ANTECUBITAL Performed at [facility], [address], [place], [place] [address]    Special Requests   Final    Blood Culture adequate volume Performed at [facility], [address], [place], [place] [address]    Culture   Final    NO GROWTH < 24 HOURS Performed at [facility], [place] [address] [place], [place] [address]    Report Status PENDING  Incomplete  Culture, blood (Routine x 2)     Status: None (Preliminary result)   Collection Time: 03/07/21 12:05 PM   Specimen: BLOOD RIGHT FOREARM  Result Value Ref Range Status   Specimen Description   Final    BLOOD RIGHT FOREARM Performed at [facility], [address] [place], [place] [address]    Special Requests   Final    Blood Culture results may not be optimal due to an excessive volume of blood received in culture bottles BOTTLES DRAWN AEROBIC AND ANAEROBIC   Culture   Final    NO GROWTH < 24 HOURS Performed at [facility], [place] [address] [place], [place] [address]    Report Status PENDING  Incomplete  Resp Panel by RT-PCR (Flu A&B, Covid) Nasopharyngeal Swab     Status: None   Collection Time: 03/07/21  2:28 PM   Specimen: Nasopharyngeal Swab; Nasopharyngeal(NP) swabs in vial transport medium  Result Value Ref Range Status   SARS Coronavirus 2 by RT PCR NEGATIVE NEGATIVE Final    Comment: (NOTE) SARS-CoV-2 target nucleic acids are NOT DETECTED.  The SARS-CoV-2 RNA is generally detectable in upper  respiratory specimens during the acute phase of infection. The lowest concentration of SARS-CoV-2 viral copies this assay can detect is 138 copies/mL. A negative result does not preclude SARS-Cov-2 infection and should not be used as the sole basis for treatment or other patient management decisions. A negative result may occur with  improper specimen collection/handling, submission of specimen other than nasopharyngeal  swab, presence of viral mutation(s) within the areas targeted by this assay, and inadequate number of viral copies(<138 copies/mL). A negative result must be combined with clinical observations, patient history, and epidemiological information. The expected result is Negative.  Fact Sheet for Patients:  EntrepreneurPulse.com.au  Fact Sheet for Healthcare Providers:  IncredibleEmployment.be  This test is no t yet approved or cleared by the [place] FDA and  has been authorized for detection and/or diagnosis of SARS-CoV-2 by FDA under an Emergency Use Authorization (EUA). This EUA will remain  in effect (meaning this test can be used) for the duration of the COVID-19 declaration under Section 564(b)(1) of the Act, 21 U.S.C.section 360bbb-3(b)(1), unless the authorization is terminated  or revoked sooner.       Influenza A by PCR NEGATIVE NEGATIVE Final   Influenza B by PCR NEGATIVE NEGATIVE Final    Comment: (NOTE) The Xpert Xpress SARS-CoV-2/FLU/RSV plus assay is intended as an aid in the diagnosis of influenza from Nasopharyngeal swab specimens and should not be used as a sole basis for treatment. Nasal washings and aspirates are unacceptable for Xpert Xpress SARS-CoV-2/FLU/RSV testing.  Fact Sheet for Patients: EntrepreneurPulse.com.au  Fact Sheet for Healthcare Providers: IncredibleEmployment.be  This test is not yet approved or cleared by the [place] FDA and has been  authorized for detection and/or diagnosis of SARS-CoV-2 by FDA under an Emergency Use Authorization (EUA). This EUA will remain in effect (meaning this test can be used) for the duration of the COVID-19 declaration under Section 564(b)(1) of the Act, 21 U.S.C. section 360bbb-3(b)(1), unless the authorization is terminated or revoked.  Performed at [facility], [address] [place], [place] [address]   MRSA Next Gen by PCR, Nasal     Status: None   Collection Time: 03/07/21  8:16 PM   Specimen: Nasal Mucosa; Nasal Swab  Result Value Ref Range Status   MRSA by PCR Next Gen NOT DETECTED NOT DETECTED Final    Comment: (NOTE) The GeneXpert MRSA Assay (FDA approved for NASAL specimens only), is one component of a comprehensive MRSA colonization surveillance program. It is not intended to diagnose MRSA infection nor to guide or monitor treatment for MRSA infections. Test performance is not FDA approved in patients less than 50 years old. Performed at [facility], [place] [address], [place], [place] [address]     Procedures/Studies: DG Chest 2 View  Result Date: 03/07/2021 CLINICAL DATA:  Sepsis.  Cough. EXAM: CHEST - 2 VIEW COMPARISON:  December 11, 2020.  December 06, 2020. FINDINGS: The heart size and mediastinal contours are within normal limits. Left lung is clear. Increased right basilar opacity is noted concerning for increased mass and associated atelectasis or infiltrate. The visualized skeletal structures are unremarkable. IMPRESSION: Increased right basilar opacity is noted concerning for increased mass and associated atelectasis or infiltrate. Electronically Signed   By: Marijo Conception M.D.   On: 03/07/2021 12:26   CT Chest W Contrast  Result Date: 03/07/2021 CLINICAL DATA:  History of non-small cell lung cancer EXAM: CT CHEST WITH CONTRAST TECHNIQUE: Multidetector CT imaging of the chest was performed during intravenous contrast  administration. CONTRAST:  63m OMNIPAQUE IOHEXOL 300 MG/ML  SOLN COMPARISON:  CT chest dated November 16, 2020 FINDINGS: Cardiovascular: Normal heart size. No pericardial effusion. Atherosclerotic disease of the thoracic aorta. Three-vessel coronary artery calcifications. Mediastinum/Nodes: New enlargement of hilar and mediastinal lymph nodes, increased in size when compared with prior exam. Reference subcarinal lymph node measuring 1.4  cm in short axis on series 2, image 70. Reference right hilar lymph node measuring 1.4 cm in short axis on series 2, image 74. Lungs/Pleura: Central airways are patent. Centrilobular emphysema. Interval increased size of right lower lobe mass, measures up to 9.5 x 6.0 cm in greatest axial dimension, previously 7.8 x 5.5 cm. Central hyperdensity is again seen within the mass, similar to prior exams. New ground-glass opacities are seen in the adjacent right lower lobe, concerning for tumor extension. Stable solid pulmonary nodule of the right upper lobe measuring 7 mm on series 4, image 80. Upper Abdomen: Low-attenuation lesion of the left kidney, likely a simple cyst. No acute abnormality. Musculoskeletal: No chest wall abnormality. No acute or significant osseous findings. IMPRESSION: 1. Interval increased size of right lower lobe mass with new possible extension into the adjacent right upper lobe. 2. New enlargement of right hilar and mediastinal lymph nodes, concerning for metastatic disease. 3. Aortic Atherosclerosis (ICD10-I70.0) and Emphysema (ICD10-J43.9). Electronically Signed   By: Yetta Glassman M.D.   On: 03/07/2021 15:10    Labs: BNP (last 3 results) No results for input(s): BNP in the last 8760 hours. Basic Metabolic Panel: Recent Labs  Lab 03/07/21 1202 03/07/21 1412 03/07/21 1415 03/07/21 1835 03/07/21 2131 03/08/21 0240 03/08/21 2021  NA 130* 133* 134* 135 133* 136  --   K 4.0 4.1 3.9 3.8 3.6 3.5  --   CL 93* 99  --  102 105 105  --   CO2 14* 19*   --  22 20* 22  --   GLUCOSE 426* 287*  --  208* 152* 107*  --   BUN 19 18  --  _0 --   CREATININE 1.26* 1.03  --  0.84 0.71 0.75  --   CALCIUM 10.0 8.5*  --  8.4* 8.2* 8.2*  --   MG  --   --   --   --   --  1.9  --   PHOS  --   --   --   --   --  1.4* 3.2   Liver Function Tests: Recent Labs  Lab 03/07/21 1202  AST 12*  ALT 20  ALKPHOS 67  BILITOT 0.8  PROT 8.6*  ALBUMIN 4.2   No results for input(s): LIPASE, AMYLASE in the last 168 hours. No results for input(s): AMMONIA in the last 168 hours. CBC: Recent Labs  Lab 03/07/21 1202 03/07/21 1415 03/08/21 0240  WBC 7.6  --  5.6  NEUTROABS 5.3  --  2.8  HGB 13.2 11.2* 10.5*  HCT 39.6 33.0* 30.8*  MCV 90.0  --  90.6  PLT 279  --  218   Cardiac Enzymes: No results for input(s): CKTOTAL, CKMB, CKMBINDEX, TROPONINI in the last 168 hours. BNP: Invalid input(s): POCBNP CBG: Recent Labs  Lab 03/08/21 0756 03/08/21 1208 03/08/21 1723 03/08/21 2127 03/09/21 0737  GLUCAP 134* 187* 205* 141* 159*   D-Dimer No results for input(s): DDIMER in the last 72 hours. Hgb A1c Recent Labs    03/07/21 2131  HGBA1C 9.2*   Lipid Profile No results for input(s): CHOL, HDL, LDLCALC, TRIG, CHOLHDL, LDLDIRECT in the last 72 hours. Thyroid function studies No results for input(s): TSH, T4TOTAL, T3FREE, THYROIDAB in the last 72 hours.  Invalid input(s): FREET3 Anemia work up No results for input(s): VITAMINB12, FOLATE, FERRITIN, TIBC, IRON, RETICCTPCT in the last 72 hours. Urinalysis    Component Value Date/Time   COLORURINE YELLOW 03/07/2021 1205  APPEARANCEUR CLEAR 03/07/2021 1205   LABSPEC 1.043 (H) 03/07/2021 1205   PHURINE 5.0 03/07/2021 1205   GLUCOSEU >1,000 (A) 03/07/2021 1205   HGBUR NEGATIVE 03/07/2021 [place] 03/07/2021 1205   KETONESUR 40 (A) 03/07/2021 1205   PROTEINUR TRACE (A) 03/07/2021 1205   UROBILINOGEN 0.2 12/11/2007 2313   NITRITE NEGATIVE 03/07/2021 1205   LEUKOCYTESUR  NEGATIVE 03/07/2021 1205   Sepsis Labs Invalid input(s): PROCALCITONIN,  WBC,  LACTICIDVEN Microbiology Recent Results (from the past 240 hour(s))  Culture, blood (Routine x 2)     Status: None (Preliminary result)   Collection Time: 03/07/21 12:00 PM   Specimen: BLOOD  Result Value Ref Range Status   Specimen Description   Final    BLOOD LEFT ANTECUBITAL Performed at [facility], [address] [place], [place] [address]    Special Requests   Final    Blood Culture adequate volume Performed at [facility], [address] [place], [place] [address]    Culture   Final    NO GROWTH < 24 HOURS Performed at [facility], [place] [address], [place], [place] [address]    Report Status PENDING  Incomplete  Culture, blood (Routine x 2)     Status: None (Preliminary result)   Collection Time: 03/07/21 12:05 PM   Specimen: BLOOD RIGHT FOREARM  Result Value Ref Range Status   Specimen Description   Final    BLOOD RIGHT FOREARM Performed at [facility], [address], [place], [place] [address]    Special Requests   Final    Blood Culture results may not be optimal due to an excessive volume of blood received in culture bottles BOTTLES DRAWN AEROBIC AND ANAEROBIC   Culture   Final    NO GROWTH < 24 HOURS Performed at [facility], [place] [address] [place], [place] [address]    Report Status PENDING  Incomplete  Resp Panel by RT-PCR (Flu A&B, Covid) Nasopharyngeal Swab     Status: None   Collection Time: 03/07/21  2:28 PM   Specimen: Nasopharyngeal Swab; Nasopharyngeal(NP) swabs in vial transport medium  Result Value Ref Range Status   SARS Coronavirus 2 by RT PCR NEGATIVE NEGATIVE Final    Comment: (NOTE) SARS-CoV-2 target nucleic acids are NOT DETECTED.  The SARS-CoV-2 RNA is generally detectable in upper respiratory specimens during the acute phase of infection. The lowest concentration of SARS-CoV-2 viral  copies this assay can detect is 138 copies/mL. A negative result does not preclude SARS-Cov-2 infection and should not be used as the sole basis for treatment or other patient management decisions. A negative result may occur with  improper specimen collection/handling, submission of specimen other than nasopharyngeal swab, presence of viral mutation(s) within the areas targeted by this assay, and inadequate number of viral copies(<138 copies/mL). A negative result must be combined with clinical observations, patient history, and epidemiological information. The expected result is Negative.  Fact Sheet for Patients:  EntrepreneurPulse.com.au  Fact Sheet for Healthcare Providers:  IncredibleEmployment.be  This test is no t yet approved or cleared by the [place] FDA and  has been authorized for detection and/or diagnosis of SARS-CoV-2 by FDA under an Emergency Use Authorization (EUA). This EUA will remain  in effect (meaning this test can be used) for the duration of the COVID-19 declaration under Section 564(b)(1) of the Act, 21 U.S.C.section 360bbb-3(b)(1), unless the authorization is terminated  or revoked sooner.  Influenza A by PCR NEGATIVE NEGATIVE Final   Influenza B by PCR NEGATIVE NEGATIVE Final    Comment: (NOTE) The Xpert Xpress SARS-CoV-2/FLU/RSV plus assay is intended as an aid in the diagnosis of influenza from Nasopharyngeal swab specimens and should not be used as a sole basis for treatment. Nasal washings and aspirates are unacceptable for Xpert Xpress SARS-CoV-2/FLU/RSV testing.  Fact Sheet for Patients: EntrepreneurPulse.com.au  Fact Sheet for Healthcare Providers: IncredibleEmployment.be  This test is not yet approved or cleared by the [place] FDA and has been authorized for detection and/or diagnosis of SARS-CoV-2 by FDA under an Emergency Use Authorization (EUA). This  EUA will remain in effect (meaning this test can be used) for the duration of the COVID-19 declaration under Section 564(b)(1) of the Act, 21 U.S.C. section 360bbb-3(b)(1), unless the authorization is terminated or revoked.  Performed at [facility], [address] [place], [place] [address]   MRSA Next Gen by PCR, Nasal     Status: None   Collection Time: 03/07/21  8:16 PM   Specimen: Nasal Mucosa; Nasal Swab  Result Value Ref Range Status   MRSA by PCR Next Gen NOT DETECTED NOT DETECTED Final    Comment: (NOTE) The GeneXpert MRSA Assay (FDA approved for NASAL specimens only), is one component of a comprehensive MRSA colonization surveillance program. It is not intended to diagnose MRSA infection nor to guide or monitor treatment for MRSA infections. Test performance is not FDA approved in patients less than 1 years old. Performed at [facility], [place] [address], [place], [place] [address]      Time coordinating discharge: 25 minutes  SIGNED: Antonieta Pert, MD  [facility] 03/09/2021, 9:47 AM  If 7PM-7AM, please contact night-coverage [email or web address]

## 2021-03-10 NOTE — Progress Notes (Signed)
Patient discharged to home with family, discharge instructions reviewed with patient who verbalized understanding. Patient verbalized understanding of the instructions provided by the diabetes educator.

## 2021-03-12 ENCOUNTER — Telehealth: Payer: Self-pay | Admitting: Medical Oncology

## 2021-03-12 LAB — CULTURE, BLOOD (ROUTINE X 2)
Culture: NO GROWTH
Culture: NO GROWTH
Special Requests: ADEQUATE

## 2021-03-12 NOTE — Telephone Encounter (Signed)
ED visit 03/07/21- DKA-He is now checking his blood glucose and now on insulin. The ED provider said the hyperglycemia was related to chemo/immunotherapy.   CT chest- "Cancer growing"  per ED provider.  Pain-Art is having a lot of pain in right lower ribcage. He found some hydrocodone from orthopedic problem and says 1/2 tablet helps with the pain. "Will Dr Julien Nordmann prescribe more"?   Appt 01/09- does he need to be seen sooner?

## 2021-03-14 ENCOUNTER — Inpatient Hospital Stay: Payer: Medicare Other

## 2021-03-15 MED FILL — Dexamethasone Sodium Phosphate Inj 100 MG/10ML: INTRAMUSCULAR | Qty: 1 | Status: AC

## 2021-03-15 MED FILL — Fosaprepitant Dimeglumine For IV Infusion 150 MG (Base Eq): INTRAVENOUS | Qty: 5 | Status: AC

## 2021-03-15 NOTE — Progress Notes (Signed)
[facility] OFFICE PROGRESS NOTE  [name], [place] [id number]  DIAGNOSIS: Stage IIIA (T4, N0, M0) non-small cell lung cancer, favoring adenocarcinoma presented with large right lower lobe lung mass with no mediastinal lymphadenopathy or extrathoracic metastasis diagnosed in October 2022. Had disease progression with enlarging mass and new right hilar and mediastinal lymph nodes in December 2022.   PRIOR THERAPY: Neoadjuvant treatment with carboplatin for AUC of 5, Alimta 500 Mg/M2 and nivolumab 360 Mg IV every 3 weeks.  First dose January 23, 2021.  Status post 2 cycles.  CURRENT THERAPY: Concurrent chemoradiation with carboplatin for an AUC of 2 and paclitaxel 45 mg per metered squared.  First dose expected on 04/01/2021  INTERVAL HISTORY: Brandon Rubio. 71 y.o. male returns to the clinic today for a follow-up visit.  The patient was last seen in clinic on 02/14/2021.  In the interval since his last appointment, the patient felt like he had COVID-19 after reporting fevers and chills and generalized weakness.  He presented to the emergency room on 03/07/2021. His COVID-19 swab was negative but he was found to be in DKA.  In the emergency room he was reporting right-sided rib pain, rash, anorexia, and dizziness.  He was also treated empirically for possible pneumonia which was ruled out.  He did complete IV antibiotics in the hospital and outpatient doxycycline.  He was admitted until 03/09/2021  for IV fluids and insulin drip.  The patient has CT of the chest performed while in the hospital which showed an enlarging right lower lobe mass with no extension into the right upper lobe and newly enlarged right hilar mediastinal lymphadenopathy.  He is reporting right-sided rib pain which is worse with coughing and laying on his left side.  He has an old prescription of hydrocodone from when he broke his leg.  He has been cutting hydrocodone in half  and taking it 3 times a day.  He sees his primary care provider tomorrow regarding his diabetes.  Overall he is feeling "terrible".  He reports weakness and sweating.  He states that he almost passed out in the shower today.  He lost 12 pounds since his last appointment.  Denies any more fevers but reports feeling cold.  He reports a productive cough which produces clear phlegm.  He cut back from smoking 2.5 packs of cigarettes per day to smoking 10 to 12 cigarettes/day.  He reports mild shortness of breath with certain activities.  He denies any hemoptysis.  Denies any nausea, vomiting, diarrhea, or constipation.  Denies any headache or visual changes.  Denies any rashes or skin changes.  The patient is here today for evaluation and more detailed discussion about his current condition and recommended treatment options.   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  MEDICATIONS:  Current Outpatient Medications  Medication Sig Dispense Refill   HYDROcodone-acetaminophen (NORCO) 5-325 MG tablet Take 1 tablet by mouth every 6 (six) hours as  needed for moderate pain. 30 tablet 0   aspirin EC 81 MG tablet Take 81 mg by mouth in the morning. Swallow whole.     blood glucose meter kit and supplies KIT Dispense based on patient and insurance preference. Use up to four times daily as directed. 1 each 0   empagliflozin (JARDIANCE) 25 MG TABS tablet Take 1 tablet (25 mg total) by mouth daily. 90 tablet 3   folic acid (FOLVITE) 1 MG tablet Take 1 tablet (1 mg total) by mouth daily. 30  tablet 4   glucose blood (FREESTYLE LITE) test strip USE TO TEST BLOOD SUGAR TWICE DAILY ICD E11.49 100 each 3   insulin glargine (LANTUS) 100 UNIT/ML Solostar Pen Inject 15 Units into the skin daily. 4.5 mL 1   Insulin Pen Needle 31G X 5 MM MISC 1 each by Does not apply route daily at 6 (six) AM. 50 each 0   Melatonin 5 MG CHEW Chew 5 mg by mouth at bedtime.     metFORMIN (GLUCOPHAGE) 1000 MG tablet TAKE 1 TABLET BY MOUTH TWICE DAILY WITH A MEAL 180 tablet 1   metoprolol succinate (TOPROL-XL) 50 MG 24 hr tablet Take 1 tablet (50 mg total) by mouth daily. Take with or immediately following a meal. 90 tablet 1   Multiple Vitamin (MULTIVITAMIN WITH MINERALS) TABS tablet Take 1 tablet by mouth daily. Centrum Silver     nicotine (NICODERM CQ - DOSED IN MG/24 HOURS) 21 mg/24hr patch Place 1 patch (21 mg total) onto the skin daily. 28 patch 0   omeprazole (PRILOSEC) 20 MG capsule TAKE 1 CAPSULE(20 MG) BY MOUTH TWICE DAILY BEFORE A MEAL 180 capsule 3   prochlorperazine (COMPAZINE) 10 MG tablet Take 1 tablet (10 mg total) by mouth every 6 (six) hours as needed for nausea or vomiting. 30 tablet 0   simvastatin (ZOCOR) 40 MG tablet Take 1 tablet (40 mg total) by mouth daily at 6 PM. 90 tablet 3   traZODone (DESYREL) 50 MG tablet Take 1-2 tablets (50-100 mg total) by mouth at bedtime as needed for sleep. (Patient taking differently: Take 50-100 mg by mouth at bedtime as needed for sleep. Pt states taking two tablets) 180 tablet 0   triamcinolone cream (KENALOG) 0.1 % Apply 1 application topically 2 (two) times daily as needed (poison ivy).     No current facility-administered medications for this visit.    SURGICAL HISTORY:  Past Surgical History:  Procedure Laterality Date   BRONCHIAL BIOPSY  12/10/2020   Procedure: BRONCHIAL BIOPSIES;  Surgeon: Collene Gobble, MD;  Location: [facility] ENDOSCOPY;  Service: Pulmonary;;   BRONCHIAL BRUSHINGS  12/10/2020   Procedure: BRONCHIAL BRUSHINGS;  Surgeon: Collene Gobble, MD;  Location: [facility] ENDOSCOPY;  Service: Pulmonary;;   BRONCHIAL NEEDLE ASPIRATION BIOPSY  12/10/2020   Procedure: BRONCHIAL NEEDLE ASPIRATION BIOPSIES;  Surgeon: Collene Gobble, MD;  Location: [facility] ENDOSCOPY;  Service: Pulmonary;;   COLONOSCOPY     COLONOSCOPY W/ POLYPECTOMY  03/10/2009   LARYNGOSCOPY  03/10/1997   VIDEO BRONCHOSCOPY WITH ENDOBRONCHIAL NAVIGATION N/A 12/10/2020   Procedure: ROBOTIC Chloride;  Surgeon: Collene Gobble, MD;  Location: [facility] ENDOSCOPY;  Service: Pulmonary;  Laterality: N/A;    REVIEW OF SYSTEMS:   Review of Systems  Constitutional: Positive for fatigue, weight loss, and generalized weakness.  Positive for chills.  Negative for fever. HENT: Negative for mouth sores, nosebleeds, sore throat and trouble swallowing.   Eyes: Negative for eye problems and  icterus.  Respiratory: Positive for productive cough.  Positive for mild dyspnea on exertion.  Negative for hemoptysis and wheezing.   Cardiovascular: Negative for chest pain and leg swelling.  Gastrointestinal: Negative for abdominal pain, constipation, diarrhea, nausea and vomiting.  Genitourinary: Negative for bladder incontinence, difficulty urinating, dysuria, frequency and hematuria.   Musculoskeletal: Negative for back pain, gait problem, neck pain and neck stiffness.  Skin: Negative for itching and rash.  Neurological: Negative for dizziness, extremity weakness, gait problem, headaches, light-headedness and seizures.  Hematological: Negative for adenopathy. Does not bruise/bleed easily.  Psychiatric/Behavioral: Negative for confusion, depression and sleep disturbance. The patient is not nervous/anxious.     PHYSICAL EXAMINATION:  Blood pressure 130/71, pulse 98, temperature 97.8 F (36.6 C), temperature source Tympanic, resp. rate 18, weight 205 lb (93 kg), SpO2 95 %.  ECOG PERFORMANCE STATUS: 1  Physical Exam  Constitutional: Oriented to person, place, and time  and well-developed, well-nourished, and in no distress.  HENT:  Head: Normocephalic and atraumatic.  Mouth/Throat: Oropharynx is clear and moist. No oropharyngeal exudate.  Eyes: Conjunctivae are normal. Right eye exhibits no discharge. Left eye exhibits no discharge. No scleral icterus.  Neck: Normal range of motion. Neck supple.  Cardiovascular: Normal rate, regular rhythm, normal heart sounds and intact distal pulses.   Pulmonary/Chest: Effort normal and breath sounds normal. No respiratory distress. No wheezes. No rales.  Abdominal: Soft. Bowel sounds are normal. Exhibits no distension and no mass. There is no tenderness.  Musculoskeletal: Normal range of motion. Exhibits no edema.  Lymphadenopathy:    No cervical adenopathy.  Neurological: Alert and oriented to person, place, and time. Exhibits normal muscle tone. Gait normal. Coordination normal.  Skin: Skin is warm and dry. No rash noted. Not diaphoretic. No erythema. No pallor.  Psychiatric: Mood, memory and judgment normal.  Vitals reviewed.  LABORATORY DATA: Lab Results  Component Value Date   WBC 8.6 03/18/2021   HGB 11.2 (L) 03/18/2021   HCT 33.5 (L) 03/18/2021   MCV 91.3 03/18/2021   PLT 370 03/18/2021      Chemistry      Component Value Date/Time   NA 136 03/08/2021 0240   K 3.5 03/08/2021 0240   CL 105 03/08/2021 0240   CO2 22 03/08/2021 0240   BUN 13 03/08/2021 0240   CREATININE 0.75 03/08/2021 0240   CREATININE 1.13 02/26/2021 0801      Component Value Date/Time   CALCIUM 8.2 (L) 03/08/2021 0240   ALKPHOS 67 03/07/2021 1202   AST 12 (L) 03/07/2021 1202   AST 27 02/26/2021 0801   ALT 20 03/07/2021 1202   ALT 48 (H) 02/26/2021 0801   BILITOT 0.8 03/07/2021 1202   BILITOT 0.4 02/26/2021 0801       RADIOGRAPHIC STUDIES:  DG Chest 2 View  Result Date: 03/07/2021 CLINICAL DATA:  Sepsis.  Cough. EXAM: CHEST - 2 VIEW COMPARISON:  December 11, 2020.  December 06, 2020. FINDINGS: The heart size and  mediastinal contours are within normal limits. Left lung is clear. Increased right basilar opacity is noted concerning for increased mass and associated atelectasis or infiltrate. The visualized skeletal structures are unremarkable. IMPRESSION: Increased right basilar opacity is noted concerning for increased mass and associated atelectasis or infiltrate. Electronically Signed   By: Marijo Conception M.D.   On: 03/07/2021 12:26   CT Chest W Contrast  Result Date: 03/07/2021 CLINICAL DATA:  History of non-small cell lung cancer EXAM: CT CHEST WITH CONTRAST TECHNIQUE: Multidetector CT imaging of  the chest was performed during intravenous contrast administration. CONTRAST:  48m OMNIPAQUE IOHEXOL 300 MG/ML  SOLN COMPARISON:  CT chest dated November 16, 2020 FINDINGS: Cardiovascular: Normal heart size. No pericardial effusion. Atherosclerotic disease of the thoracic aorta. Three-vessel coronary artery calcifications. Mediastinum/Nodes: New enlargement of hilar and mediastinal lymph nodes, increased in size when compared with prior exam. Reference subcarinal lymph node measuring 1.4 cm in short axis on series 2, image 70. Reference right hilar lymph node measuring 1.4 cm in short axis on series 2, image 74. Lungs/Pleura: Central airways are patent. Centrilobular emphysema. Interval increased size of right lower lobe mass, measures up to 9.5 x 6.0 cm in greatest axial dimension, previously 7.8 x 5.5 cm. Central hyperdensity is again seen within the mass, similar to prior exams. New ground-glass opacities are seen in the adjacent right lower lobe, concerning for tumor extension. Stable solid pulmonary nodule of the right upper lobe measuring 7 mm on series 4, image 80. Upper Abdomen: Low-attenuation lesion of the left kidney, likely a simple cyst. No acute abnormality. Musculoskeletal: No chest wall abnormality. No acute or significant osseous findings. IMPRESSION: 1. Interval increased size of right lower lobe mass  with new possible extension into the adjacent right upper lobe. 2. New enlargement of right hilar and mediastinal lymph nodes, concerning for metastatic disease. 3. Aortic Atherosclerosis (ICD10-I70.0) and Emphysema (ICD10-J43.9). Electronically Signed   By: LYetta GlassmanM.D.   On: 03/07/2021 15:10     ASSESSMENT/PLAN:  This is a very pleasant 72year old Caucasian male diagnosed with stage IIIa (T4, N1, M0) non-small cell lung cancer, favor to be adenocarcinoma.  He initially presented with a right lower lobe lung mass with no mediastinal lymphadenopathy or extrathoracic metastases.  He was diagnosed in October 2022.  He showed evidence of disease progression after 2 cycles of neoadjuvant treatment with enlarging right lower lobe lung mass and new enlarging right hilar mediastinal lymph nodes.  The patient underwent 2 cycles of neoadjuvant systemic chemotherapy with carboplatin for an AUC of 5, Alimta 500 mg per metered squared and nivolumab 360 mg IV every 3 weeks.  He status post 2 cycles.  The patient recently was hospitalized for DKA.  He had a CT scan performed while in the hospital which showed a increase in the right lower lobe mass with no extension to the right upper lobe and new enlarged right hilar mediastinal lymphadenopathy.   The patient was seen with Dr. MJulien Nordmanntoday.  Dr. MJulien Nordmannhad a lengthy discussion with the patient today about his current condition and recommended treatment options based on the scan results.  Given the enlargement of his disease, Dr. MJulien Nordmanndoes not believe he is a surgical candidate at this point.  Dr. MJulien Nordmannrecommends treating him as locally advanced disease with concurrent chemoradiation with carboplatin for an AUC of 2 and paclitaxel 45 mg per metered squared.  The patient is interested in this option and he is expected to receive his first dose of treatment on 04/01/2021.  I have discussed adverse side effects of treatment including but not limited to  fatigue, peripheral neuropathy, alopecia, nausea, vomiting, kidney, and liver dysfunction.  I have placed a referral to radiation oncology.  We will see him back for follow-up visit in 2 weeks for evaluation before starting cycle #1.  Due to the patient's weakness, we will arrange for him to receive IV fluids today 1 L over 2 hours.  He will not receive the last cycle of neoadjuvant treatment today and the  disease progression  I have placed referral to radiation oncology.  The patient is scheduled to see his family doctor tomorrow.  Advised him to discuss management of his diabetes.  The patient is now monitoring his blood sugar closer.  For pain control due to his right-sided rib pain which is likely secondary to his malignancy I have sent a prescription for Norco to his pharmacy.  The patient has lost a significant amount of weight.  If he drinks any supplemental drinks, I encouraged him to drink the supplemental kind given his diabetes.  I offered a referral to the nutritionist which he has declined for now.  The patient was advised to call immediately if he has any concerning symptoms in the interval. The patient voices understanding of current disease status and treatment options and is in agreement with the current care plan. All questions were answered. The patient knows to call the clinic with any problems, questions or concerns. We can certainly see the patient much sooner if necessary   Orders Placed This Encounter  Procedures   CBC with Differential ([facility] Only)    Standing Status:   Standing    Number of Occurrences:   6    Standing Expiration Date:   03/18/2022   CMP ([facility] only)    Standing Status:   Standing    Number of Occurrences:   6    Standing Expiration Date:   03/18/2022   Ambulatory referral to Radiation Oncology    Referral Priority:   Routine    Referral Type:   Consultation    Referral Reason:   Specialty Services Required    Requested  Specialty:   Radiation Oncology    Number of Visits Requested:   [place], PA-C 03/18/21  ADDENDUM: Hematology/Oncology Attending: I had a face-to-face encounter with the patient today.  I reviewed his record, lab, scan and recommended his care plan.  This is a very pleasant 71 years old white male with a stage IIIA (T4, N1, M0) non-small cell lung cancer favoring adenocarcinoma presented with large right lower lobe lung mass in addition to right hilar lymphadenopathy.  The patient is status post 3 cycles of neoadjuvant chemotherapy with carboplatin, Alimta and nivolumab. The patient tolerated his treatment well except for fatigue. He had repeat CT scan of the chest performed recently.  I personally and independently reviewed the scan images and discussed the results with the patient today. Unfortunately his scan showed further increase in the size of the right lower lobe lung mass as well as the right hilar lymphadenopathy concerning for disease progression.  He is not a good surgical candidate for resection based on the new findings. I recommended for the patient to consider a course of concurrent chemoradiation with weekly carboplatin for AUC of 2 and paclitaxel 45 Mg/M2. I will refer the patient to radiation oncology for evaluation and discussion of the radiotherapy option. I discussed with the patient the adverse effect of this treatment including but not limited to alopecia, myelosuppression, nausea and vomiting, peripheral neuropathy, liver or renal dysfunction. He is expected to start the first cycle of this treatment on April 01, 2021. The patient will come back for follow-up visit at that time. For the fatigue, lack of appetite and dehydration, I will arrange for the patient to receive 1 L of normal saline in the clinic today. He was advised to call immediately if he has any concerning symptoms in the interval. The  total time spent in the appointment was 40  minutes. Disclaimer: This note was dictated with voice recognition software. Similar sounding words can inadvertently be transcribed and may be missed upon review. Eilleen Kempf, MD 03/18/21

## 2021-03-18 ENCOUNTER — Other Ambulatory Visit: Payer: Self-pay

## 2021-03-18 ENCOUNTER — Inpatient Hospital Stay: Payer: Medicare Other

## 2021-03-18 ENCOUNTER — Other Ambulatory Visit: Payer: Self-pay | Admitting: Internal Medicine

## 2021-03-18 ENCOUNTER — Telehealth: Payer: Self-pay | Admitting: Radiation Oncology

## 2021-03-18 ENCOUNTER — Inpatient Hospital Stay (HOSPITAL_BASED_OUTPATIENT_CLINIC_OR_DEPARTMENT_OTHER): Payer: Medicare Other | Admitting: Physician Assistant

## 2021-03-18 VITALS — BP 130/71 | HR 98 | Temp 97.8°F | Resp 18 | Wt 205.0 lb

## 2021-03-18 DIAGNOSIS — Z7189 Other specified counseling: Secondary | ICD-10-CM | POA: Diagnosis not present

## 2021-03-18 DIAGNOSIS — Z510 Encounter for antineoplastic radiation therapy: Secondary | ICD-10-CM | POA: Diagnosis not present

## 2021-03-18 DIAGNOSIS — Z5111 Encounter for antineoplastic chemotherapy: Secondary | ICD-10-CM | POA: Insufficient documentation

## 2021-03-18 DIAGNOSIS — C3431 Malignant neoplasm of lower lobe, right bronchus or lung: Secondary | ICD-10-CM | POA: Insufficient documentation

## 2021-03-18 DIAGNOSIS — R531 Weakness: Secondary | ICD-10-CM

## 2021-03-18 DIAGNOSIS — R5382 Chronic fatigue, unspecified: Secondary | ICD-10-CM

## 2021-03-18 DIAGNOSIS — C3491 Malignant neoplasm of unspecified part of right bronchus or lung: Secondary | ICD-10-CM | POA: Diagnosis not present

## 2021-03-18 DIAGNOSIS — Z79899 Other long term (current) drug therapy: Secondary | ICD-10-CM | POA: Insufficient documentation

## 2021-03-18 LAB — CMP (CANCER CENTER ONLY)
ALT: 29 U/L (ref 0–44)
AST: 52 U/L — ABNORMAL HIGH (ref 15–41)
Albumin: 3.4 g/dL — ABNORMAL LOW (ref 3.5–5.0)
Alkaline Phosphatase: 69 U/L (ref 38–126)
Anion gap: 12 (ref 5–15)
BUN: 12 mg/dL (ref 8–23)
CO2: 26 mmol/L (ref 22–32)
Calcium: 8.8 mg/dL — ABNORMAL LOW (ref 8.9–10.3)
Chloride: 101 mmol/L (ref 98–111)
Creatinine: 0.87 mg/dL (ref 0.61–1.24)
GFR, Estimated: >60 mL/min (ref >60)
Glucose, Bld: 150 mg/dL — ABNORMAL HIGH (ref 70–99)
Potassium: 3.7 mmol/L (ref 3.5–5.1)
Sodium: 139 mmol/L (ref 135–145)
Total Bilirubin: 0.4 mg/dL (ref 0.3–1.2)
Total Protein: 7.1 g/dL (ref 6.5–8.1)

## 2021-03-18 LAB — CBC WITH DIFFERENTIAL (CANCER CENTER ONLY)
Abs Immature Granulocytes: 0.06 10*3/uL (ref 0.00–0.07)
Basophils Absolute: 0.1 10*3/uL (ref 0.0–0.1)
Basophils Relative: 1 %
Eosinophils Absolute: 0.1 10*3/uL (ref 0.0–0.5)
Eosinophils Relative: 1 %
HCT: 33.5 % — ABNORMAL LOW (ref 39.0–52.0)
Hemoglobin: 11.2 g/dL — ABNORMAL LOW (ref 13.0–17.0)
Immature Granulocytes: 1 %
Lymphocytes Relative: 21 %
Lymphs Abs: 1.8 10*3/uL (ref 0.7–4.0)
MCH: 30.5 pg (ref 26.0–34.0)
MCHC: 33.4 g/dL (ref 30.0–36.0)
MCV: 91.3 fL (ref 80.0–100.0)
Monocytes Absolute: 0.6 10*3/uL (ref 0.1–1.0)
Monocytes Relative: 7 %
Neutro Abs: 6 10*3/uL (ref 1.7–7.7)
Neutrophils Relative %: 69 %
Platelet Count: 370 10*3/uL (ref 150–400)
RBC: 3.67 MIL/uL — ABNORMAL LOW (ref 4.22–5.81)
RDW: 14.5 % (ref 11.5–15.5)
WBC Count: 8.6 10*3/uL (ref 4.0–10.5)
nRBC: 0 % (ref 0.0–0.2)

## 2021-03-18 LAB — TSH: TSH: 1.391 u[IU]/mL (ref 0.320–4.118)

## 2021-03-18 MED ORDER — HYDROCODONE-ACETAMINOPHEN 5-325 MG PO TABS: 1.0000 | ORAL_TABLET | Freq: Four times a day (QID) | ORAL | 0 refills | Status: DC | PRN

## 2021-03-18 MED ORDER — SODIUM CHLORIDE 0.9 % IV SOLN: Freq: Once | INTRAVENOUS | Status: AC

## 2021-03-18 NOTE — Progress Notes (Signed)
DISCONTINUE OFF PATHWAY REGIMEN - Non-Small Cell Lung   [id number]:Carboplatin AUC=5 + Pemetrexed 500 mg/m2 q21 Days:   A cycle is every 21 days:     Pemetrexed      Carboplatin   **Always confirm dose/schedule in your pharmacy ordering system**  REASON: Disease Progression PRIOR TREATMENT: Off Pathway: Carboplatin AUC=5 + Pemetrexed 500 mg/m2 q21 Days TREATMENT RESPONSE: Progressive Disease (PD)  START ON PATHWAY REGIMEN - Non-Small Cell Lung     Administer weekly:     Paclitaxel      Carboplatin   **Always confirm dose/schedule in your pharmacy ordering system**  Patient Characteristics: Preoperative or Nonsurgical Candidate (Clinical Staging), Stage III - Nonsurgical Candidate (Nonsquamous and Squamous), PS = 0, 1 Therapeutic Status: Preoperative or Nonsurgical Candidate (Clinical Staging) AJCC T Category: cT4 AJCC N Category: cN2 AJCC M Category: cM0 AJCC 8 Stage Grouping: IIIB ECOG Performance Status: 1 Intent of Therapy: Curative Intent, Discussed with Patient

## 2021-03-18 NOTE — Telephone Encounter (Signed)
Unable to LVM, mailbox full.

## 2021-03-18 NOTE — Patient Instructions (Signed)

## 2021-03-18 NOTE — Patient Instructions (Signed)
-  There are two main categories of lung cancer, they are named based on the size of the cancer cell. One is called Non-Small cell lung cancer. The other type is Small Cell Lung Cancer -The sample (biopsy) that they took of your tumor was consistent with a subtype of Non-small cell lung cancer called Adenocarcinoma.  -We covered a lot of important information at your appointment today regarding what the treatment plan is moving forward. Here are the the main points that were discussed at your office visit with [place] today:  -The treatment that you will receive consists of two chemotherapy drugs called Carboplatin and Paclitaxel (also called Taxol) -We are planning on starting your treatment next week on 04/01/21 but before your start your treatment, I would like you to attend a Chemotherapy Education Class. This involves having you sit down with one of our nurse educators. She will discuss with you one-on-one more details about your treatment as well as general information about resources here at the [facility] treatment will be given every week for about 6 weeks or so (as long as you are also receiving radiation). We will check your labs (blood work) once a week . Dr. Julien Nordmann or I will see you every other treatment just to make sure that you are doing well and that everything is on track. -We will get a CT scan about 3 weeks after you complete your treatment  Medications:  -Compazine was sent to your pharmacy. This medication is for nausea. You may take this every 6 hours as needed if you feel nausous.   Referrals -I will place the referral to radiation oncology to meet with you to discuss starting radiation treatment. Please be on the lookout for a phone call from them to schedule a consultation.   Follow Up:  -We will see you back for a follow up visit in 2 weeks with

## 2021-03-19 ENCOUNTER — Encounter: Payer: Self-pay | Admitting: Internal Medicine

## 2021-03-19 ENCOUNTER — Ambulatory Visit (INDEPENDENT_AMBULATORY_CARE_PROVIDER_SITE_OTHER): Payer: Medicare Other | Admitting: Internal Medicine

## 2021-03-19 DIAGNOSIS — J410 Simple chronic bronchitis: Secondary | ICD-10-CM | POA: Diagnosis not present

## 2021-03-19 DIAGNOSIS — R7989 Other specified abnormal findings of blood chemistry: Secondary | ICD-10-CM

## 2021-03-19 DIAGNOSIS — E860 Dehydration: Secondary | ICD-10-CM | POA: Diagnosis not present

## 2021-03-19 DIAGNOSIS — E118 Type 2 diabetes mellitus with unspecified complications: Secondary | ICD-10-CM | POA: Diagnosis not present

## 2021-03-19 MED ORDER — EMPAGLIFLOZIN 25 MG PO TABS: 25.0000 mg | ORAL_TABLET | Freq: Every day | ORAL | 3 refills | Status: DC

## 2021-03-19 NOTE — Patient Instructions (Signed)
Keep monitoring the morning sugar and if <180 that is good keep at 15 units. If consistently above 200 go up 2 units and let us know. Do not go up more than twice a week.

## 2021-03-19 NOTE — Progress Notes (Signed)
° °  Subjective:   Patient ID: Brandon Rubio., male    DOB: December 25, 1950, 71 y.o.   MRN: [id number]  HPI The patient is a 71 YO man coming in for hospital follow up (in for DKA possibly related to chemotherapy, with enlarge right LL cancer). Saw oncology yesterday and they are changing his chemo regimen for enlarging stage 3 RLL likely adenocarcinoma. He is having cough, weight loss, pain. Taking hydrocodone for pain. Down 13 pounds since July.   Review of Systems  Constitutional:  Positive for activity change, appetite change and fatigue.  HENT: Negative.    Eyes: Negative.   Respiratory:  Positive for cough and shortness of breath. Negative for chest tightness.   Cardiovascular:  Negative for chest pain, palpitations and leg swelling.  Gastrointestinal:  Negative for abdominal distention, abdominal pain, constipation, diarrhea, nausea and vomiting.  Musculoskeletal: Negative.   Skin: Negative.   Neurological: Negative.   Psychiatric/Behavioral: Negative.     Objective:  Physical Exam Constitutional:      Appearance: He is well-developed.  HENT:     Head: Normocephalic and atraumatic.  Cardiovascular:     Rate and Rhythm: Normal rate and regular rhythm.  Pulmonary:     Effort: Pulmonary effort is normal. No respiratory distress.     Breath sounds: Rhonchi present. No wheezing or rales.  Abdominal:     General: Bowel sounds are normal. There is no distension.     Palpations: Abdomen is soft.     Tenderness: There is no abdominal tenderness. There is no rebound.  Musculoskeletal:     Cervical back: Normal range of motion.  Skin:    General: Skin is warm and dry.  Neurological:     Mental Status: He is alert and oriented to person, place, and time.     Coordination: Coordination normal.    Vitals:   03/19/21 0928  BP: 124/78  Pulse: 89  Resp: 18  SpO2: 94%  Weight: 206 lb 6.4 oz (93.6 kg)  Height: 6' (1.829 m)    This visit occurred during the SARS-CoV-2 public  health emergency.  Safety protocols were in place, including screening questions prior to the visit, additional usage of staff PPE, and extensive cleaning of exam room while observing appropriate contact time as indicated for disinfecting solutions.   Assessment & Plan:

## 2021-03-19 NOTE — Assessment & Plan Note (Signed)
He is taking lantus 15 units daily, metformin and jardiance. Recent dehydration and doing well today. Advised if morning sugar consistently >180 increase lantus 2 units and contact us. If <100 consistently lower 2 units and contact us. If recurrent dehydration would stop jardiance due to risk of dehydration with this. Okay to continue metformin and jardiance and lantus for now.

## 2021-03-21 ENCOUNTER — Inpatient Hospital Stay: Payer: Medicare Other

## 2021-03-21 ENCOUNTER — Other Ambulatory Visit: Payer: Self-pay

## 2021-03-21 DIAGNOSIS — C3431 Malignant neoplasm of lower lobe, right bronchus or lung: Secondary | ICD-10-CM | POA: Diagnosis not present

## 2021-03-21 DIAGNOSIS — C3491 Malignant neoplasm of unspecified part of right bronchus or lung: Secondary | ICD-10-CM

## 2021-03-21 DIAGNOSIS — Z510 Encounter for antineoplastic radiation therapy: Secondary | ICD-10-CM | POA: Diagnosis not present

## 2021-03-21 DIAGNOSIS — Z5111 Encounter for antineoplastic chemotherapy: Secondary | ICD-10-CM | POA: Diagnosis not present

## 2021-03-21 DIAGNOSIS — Z79899 Other long term (current) drug therapy: Secondary | ICD-10-CM | POA: Diagnosis not present

## 2021-03-21 LAB — CBC WITH DIFFERENTIAL (CANCER CENTER ONLY)
Abs Immature Granulocytes: 0.04 10*3/uL (ref 0.00–0.07)
Basophils Absolute: 0.1 10*3/uL (ref 0.0–0.1)
Basophils Relative: 2 %
Eosinophils Absolute: 0.1 10*3/uL (ref 0.0–0.5)
Eosinophils Relative: 1 %
HCT: 33.4 % — ABNORMAL LOW (ref 39.0–52.0)
Hemoglobin: 11.2 g/dL — ABNORMAL LOW (ref 13.0–17.0)
Immature Granulocytes: 1 %
Lymphocytes Relative: 33 %
Lymphs Abs: 2.3 10*3/uL (ref 0.7–4.0)
MCH: 30.9 pg (ref 26.0–34.0)
MCHC: 33.5 g/dL (ref 30.0–36.0)
MCV: 92 fL (ref 80.0–100.0)
Monocytes Absolute: 0.6 10*3/uL (ref 0.1–1.0)
Monocytes Relative: 8 %
Neutro Abs: 3.9 10*3/uL (ref 1.7–7.7)
Neutrophils Relative %: 55 %
Platelet Count: 357 10*3/uL (ref 150–400)
RBC: 3.63 MIL/uL — ABNORMAL LOW (ref 4.22–5.81)
RDW: 15.4 % (ref 11.5–15.5)
WBC Count: 7 10*3/uL (ref 4.0–10.5)
nRBC: 0 % (ref 0.0–0.2)

## 2021-03-21 LAB — CMP (CANCER CENTER ONLY)
ALT: 26 U/L (ref 0–44)
AST: 17 U/L (ref 15–41)
Albumin: 3.6 g/dL (ref 3.5–5.0)
Alkaline Phosphatase: 56 U/L (ref 38–126)
Anion gap: 10 (ref 5–15)
BUN: 10 mg/dL (ref 8–23)
CO2: 27 mmol/L (ref 22–32)
Calcium: 8.9 mg/dL (ref 8.9–10.3)
Chloride: 105 mmol/L (ref 98–111)
Creatinine: 0.85 mg/dL (ref 0.61–1.24)
GFR, Estimated: >60 mL/min (ref >60)
Glucose, Bld: 146 mg/dL — ABNORMAL HIGH (ref 70–99)
Potassium: 3.9 mmol/L (ref 3.5–5.1)
Sodium: 142 mmol/L (ref 135–145)
Total Bilirubin: 0.3 mg/dL (ref 0.3–1.2)
Total Protein: 7.1 g/dL (ref 6.5–8.1)

## 2021-03-22 NOTE — Assessment & Plan Note (Signed)
He is working on quitting. Has nicotine patches.

## 2021-03-22 NOTE — Assessment & Plan Note (Signed)
Counseled that this was likely result of high sugars but jardiance could contribute. If recurrent I would recommend to stop jardiance while he is still on chemo.

## 2021-03-22 NOTE — Assessment & Plan Note (Signed)
Normalized once blood sugars normalized.

## 2021-03-25 NOTE — Progress Notes (Signed)
Radiation Oncology         [phone]) [phone] ________________________________  Name: Brandon Rubio.        MRN: [id number]  Date of Service: 03/26/2021 DOB: 1950-05-03  VZ:SMOLMBEM, Real Cons, MD  Curt Bears, MD     REFERRING PHYSICIAN: Curt Bears, MD   DIAGNOSIS: The primary encounter diagnosis was Adenocarcinoma of right lung, stage 3 ([facility]). A diagnosis of Primary malignant neoplasm of right lower lobe of lung (HCC) was also pertinent to this visit.   HISTORY OF PRESENT ILLNESS: Brandon Rubio. is a 71 y.o. male seen at the request of Dr. Julien Nordmann for a new diagnosis of lung cancer.  The patient was originally found in lung cancer screening clinic by CT imaging in August 2022 to have a  suspicious lesion in the right lower lobe measuring up to 7.2 cm with a solid component of 5.7 cm.  A PET scan on 11/28/2020 showed peripheral consolidation in the right lower lobe with internal air bronchograms with an SUV of 6.5, higher SUV was noted centrally in the denser portion of the mass.  Increasing interstitial accentuation was seen in the right lower lobe with SUV of 2.7.  He underwent bronchoscopy on 12/10/2020 which showed malignant cells consistent with non-small cell lung cancer, favoring adenocarcinoma.  Initially the plan was to consider 3 cycles of chemoimmunotherapy with possible interval surgical resection.  He was sent to meet with cardiothoracic surgery.  He began his treatment with carboplatin Opdivo and Alimta on 01/24/2021 he received his second cycle on 02/14/2021.  He developed dizziness anorexia rash and weakness while he was also describing his symptoms it sounded like he was somewhat feverish as well.  He proceeded to the emergency department and COVID-19 was negative as was the remainder of his respiratory panel.  He was however diagnosed with diabetic ketoacidosis.  Imaging did show interval increase size in the right lower lobe mass with new extension into the right  upper lobe and enlargement of the right hilar and mediastinal lymph nodes concerning for metastatic spread.  He is no longer felt to be a good candidate for surgical resection and is now seen to convert to chemoradiation with his next cycle on 04/01/2020.    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   Peripheral neuropathy    toes       PAST SURGICAL HISTORY: Past Surgical History:  Procedure Laterality Date   BRONCHIAL BIOPSY  12/10/2020   Procedure: BRONCHIAL BIOPSIES;  Surgeon: Collene Gobble, MD;  Location: [facility];  Service: Pulmonary;;   BRONCHIAL BRUSHINGS  12/10/2020   Procedure: BRONCHIAL BRUSHINGS;  Surgeon: Collene Gobble, MD;  Location: [facility];  Service: Pulmonary;;   BRONCHIAL NEEDLE ASPIRATION BIOPSY  12/10/2020   Procedure: BRONCHIAL NEEDLE ASPIRATION BIOPSIES;  Surgeon: Collene Gobble, MD;  Location: [facility] ENDOSCOPY;  Service: Pulmonary;;   COLONOSCOPY     COLONOSCOPY W/ POLYPECTOMY  03/10/2009   LARYNGOSCOPY  03/10/1997   VIDEO BRONCHOSCOPY WITH ENDOBRONCHIAL NAVIGATION N/A 12/10/2020   Procedure: ROBOTIC  VIDEO BRONCHOSCOPY WITH ENDOBRONCHIAL NAVIGATION;  Surgeon: Collene Gobble, MD;  Location: [facility] ENDOSCOPY;  Service: Pulmonary;  Laterality: N/A;     FAMILY HISTORY:  Family History  Problem Relation Age of Onset   Lung cancer Mother        X 2   Colon polyps Father    Colon cancer Father 71   Coronary artery disease Father    Lung disease Father    Esophageal cancer Neg Hx    Stomach cancer Neg Hx    Rectal cancer Neg Hx      SOCIAL HISTORY:  reports that  he has been smoking cigarettes. He has a 58.00 pack-year smoking history. He has never used smokeless tobacco. He reports that he does not drink alcohol and does not use drugs.   ALLERGIES: Penicillins   MEDICATIONS:  Current Outpatient Medications  Medication Sig Dispense Refill   aspirin EC 81 MG tablet Take 81 mg by mouth in the morning. Swallow whole.     blood glucose meter kit and supplies KIT Dispense based on patient and insurance preference. Use up to four times daily as directed. 1 each 0   empagliflozin (JARDIANCE) 25 MG TABS tablet Take 1 tablet (25 mg total) by mouth daily. 90 tablet 3   folic acid (FOLVITE) 1 MG tablet Take 1 tablet (1 mg total) by mouth daily. 30 tablet 4   glucose blood (FREESTYLE LITE) test strip USE TO TEST BLOOD SUGAR TWICE DAILY ICD E11.49 100 each 3   HYDROcodone-acetaminophen (NORCO) 5-325 MG tablet Take 1 tablet by mouth every 6 (six) hours as needed for moderate pain. 30 tablet 0   insulin glargine (LANTUS) 100 UNIT/ML Solostar Pen Inject 15 Units into the skin daily. 4.5 mL 1   Insulin Pen Needle 31G X 5 MM MISC 1 each by Does not apply route daily at 6 (six) AM. 50 each 0   Melatonin 5 MG CHEW Chew 5 mg by mouth at bedtime.     metFORMIN (GLUCOPHAGE) 1000 MG tablet TAKE 1 TABLET BY MOUTH TWICE DAILY WITH A MEAL 180 tablet 1   metoprolol succinate (TOPROL-XL) 50 MG 24 hr tablet Take 1 tablet (50 mg total) by mouth daily. Take with or immediately following a meal. 90 tablet 1   Multiple Vitamin (MULTIVITAMIN WITH MINERALS) TABS tablet Take 1 tablet by mouth daily. Centrum Silver     omeprazole (PRILOSEC) 20 MG capsule TAKE 1 CAPSULE(20 MG) BY MOUTH TWICE DAILY BEFORE A MEAL 180 capsule 3   prochlorperazine (COMPAZINE) 10 MG tablet Take 1 tablet (10 mg total) by mouth every 6 (six) hours as needed for nausea or vomiting. 30 tablet 0   simvastatin (ZOCOR) 40 MG tablet Take 1 tablet (40 mg total) by mouth daily at 6 PM. 90 tablet 3   traZODone (DESYREL)  50 MG tablet Take 1-2 tablets (50-100 mg total) by mouth at bedtime as needed for sleep. (Patient taking differently: Take 50-100 mg by mouth at bedtime as needed for sleep. Pt states taking two tablets) 180 tablet 0   triamcinolone cream (KENALOG) 0.1 % Apply 1 application topically 2 (two) times daily as needed (poison ivy).     nicotine (NICODERM CQ - DOSED IN MG/24 HOURS) 21 mg/24hr patch Place 1 patch (21 mg total) onto the skin daily. (Patient not taking: Reported on 03/26/2021) 28 patch 0   No current facility-administered medications for this encounter.     REVIEW OF SYSTEMS: On review of systems, the patient  reports that he is doing well overall. He has occasional shortness of breath, and has had decreased appetite resulting in about 12 pounds of unintentional weight loss. He has productive cough with yellow or clear phlegm and denies any hemoptysis. No other complaints are verbalized.     PHYSICAL EXAM:  Wt Readings from Last 3 Encounters:  03/26/21 206 lb 2 oz (93.5 kg)  03/19/21 206 lb 6.4 oz (93.6 kg)  03/18/21 205 lb (93 kg)   Temp Readings from Last 3 Encounters:  03/26/21 97.9 F (36.6 C) (Temporal)  03/18/21 97.8 F (36.6 C) (Tympanic)  03/09/21 98.2 F (36.8 C) (Oral)   BP Readings from Last 3 Encounters:  03/26/21 (!) 141/68  03/19/21 124/78  03/18/21 130/71   Pulse Readings from Last 3 Encounters:  03/26/21 93  03/19/21 89  03/18/21 98   Pain Assessment Pain Score: 5  Pain Loc: Back/10  In general this is a well appearing caucasian male in no acute distress. He's alert and oriented x4 and appropriate throughout the examination. Cardiopulmonary assessment is negative for acute distress and he exhibits normal effort.     ECOG = 1  0 - Asymptomatic (Fully active, able to carry on all predisease activities without restriction)  1 - Symptomatic but completely ambulatory (Restricted in physically strenuous activity but ambulatory and able to carry out work  of a light or sedentary nature. For example, light housework, office work)  2 - Symptomatic, <50% in bed during the day (Ambulatory and capable of all self care but unable to carry out any work activities. Up and about more than 50% of waking hours)  3 - Symptomatic, >50% in bed, but not bedbound (Capable of only limited self-care, confined to bed or chair 50% or more of waking hours)  4 - Bedbound (Completely disabled. Cannot carry on any self-care. Totally confined to bed or chair)  5 - Death   Eustace Pen MM, Creech RH, Tormey DC, et al. [phone]). "Toxicity and response criteria of the [facility]". [place] Oncol. 5 (6): 649-55    LABORATORY DATA:  Lab Results  Component Value Date   WBC 7.0 03/21/2021   HGB 11.2 (L) 03/21/2021   HCT 33.4 (L) 03/21/2021   MCV 92.0 03/21/2021   PLT 357 03/21/2021   Lab Results  Component Value Date   NA 142 03/21/2021   K 3.9 03/21/2021   CL 105 03/21/2021   CO2 27 03/21/2021   Lab Results  Component Value Date   ALT 26 03/21/2021   AST 17 03/21/2021   ALKPHOS 56 03/21/2021   BILITOT 0.3 03/21/2021      RADIOGRAPHY: DG Chest 2 View  Result Date: 03/07/2021 CLINICAL DATA:  Sepsis.  Cough. EXAM: CHEST - 2 VIEW COMPARISON:  December 11, 2020.  December 06, 2020. FINDINGS: The heart size and mediastinal contours are within normal limits. Left lung is clear. Increased right basilar opacity is noted concerning for increased mass and associated atelectasis or infiltrate. The visualized skeletal structures are unremarkable. IMPRESSION: Increased right basilar opacity is noted concerning for increased mass and associated atelectasis or infiltrate. Electronically Signed   By: Marijo Conception M.D.   On: 03/07/2021 12:26   CT Chest W Contrast  Result Date: 03/07/2021 CLINICAL DATA:  History of non-small cell lung cancer EXAM: CT CHEST WITH CONTRAST TECHNIQUE: Multidetector CT imaging of the chest was performed during intravenous  contrast administration. CONTRAST:  40m OMNIPAQUE IOHEXOL 300 MG/ML  SOLN COMPARISON:  CT chest dated November 16, 2020 FINDINGS: Cardiovascular: Normal heart size. No pericardial effusion. Atherosclerotic disease of the thoracic aorta. Three-vessel coronary artery calcifications. Mediastinum/Nodes: New enlargement of hilar and mediastinal lymph nodes, increased in size when compared with prior exam. Reference subcarinal lymph node measuring 1.4 cm in short axis on series 2, image 70. Reference right hilar lymph node measuring 1.4 cm in short axis on series 2, image 74. Lungs/Pleura: Central airways are patent. Centrilobular emphysema. Interval increased size of right lower lobe mass, measures up to 9.5 x 6.0 cm in greatest axial dimension, previously 7.8 x 5.5 cm. Central hyperdensity is again seen within the mass, similar to prior exams. New ground-glass opacities are seen in the adjacent right lower lobe, concerning for tumor extension. Stable solid pulmonary nodule of the right upper lobe measuring 7 mm on series 4, image 80. Upper Abdomen: Low-attenuation lesion of the left kidney, likely a simple cyst. No acute abnormality. Musculoskeletal: No chest wall abnormality. No acute or significant osseous findings. IMPRESSION: 1. Interval increased size of right lower lobe mass with new possible extension into the adjacent right upper lobe. 2. New enlargement of right hilar and mediastinal lymph nodes, concerning for metastatic disease. 3. Aortic Atherosclerosis (ICD10-I70.0) and Emphysema (ICD10-J43.9). Electronically Signed   By: Yetta Glassman M.D.   On: 03/07/2021 15:10       IMPRESSION/PLAN: 1. Progressive Stage III, cT4N1,M0, NSCLC, adenocarcinoma of the RLL. Dr. Lisbeth Renshaw discusses the pathology findings and reviews the nature of locally advanced disease.  We discussed the risks, benefits, short, and long term effects of radiotherapy, as well as the curative intent, and the patient is interested in  proceeding. Dr. Lisbeth Renshaw discusses the delivery and logistics of radiotherapy and anticipates a course of 6 1/2 weeks of radiotherapy to the right lung target and regioanl nodes. Written consent is obtained and placed in the chart, a copy was provided to the patient. He will simulate tomorrow so he can start treatment on 04/01/21. 2. Recent DKA. The patient will follow up with his PCP for management of his diabetes. His blood sugars have been stable, and he is using sliding scale insulin, but is aware his demands may reduce for insulin as he may also lose weight from esophagitis and not eating well during radiation.    In a visit lasting 60 minutes, greater than 50% of the time was spent face to face discussing the patient's condition, in preparation for the discussion, and coordinating the patient's care.   The above documentation reflects my direct findings during this shared patient visit. Please see the separate note by Dr. Lisbeth Renshaw on this date for the remainder of the patient's plan of care.    Carola Rhine, [facility]   **Disclaimer: This note was dictated with voice recognition software. Similar sounding words can inadvertently be transcribed and this note may contain transcription errors which may not have been corrected upon publication of note.**

## 2021-03-25 NOTE — Progress Notes (Signed)
Thoracic Location of Tumor / Histology: Right Lower Lobe Lung  Patient is currently having treatment for diagnosed RLL Lung Cancer.  He recently reported to the ER with complaints of Covid like symptoms and right side rib pain, rash, anorexia, and dizziness.  CT Chest 03/07/2021: enlarging right lower lobe mass with no extension into the right upper lobe and newly enlarged right hilar mediastinal lymphadenopathy.   MRI Brain 01/01/2021: There is no acute infarction or intracranial hemorrhage.  There is no intracranial mass, mass effect, or edema. There is no hydrocephalus or extra-axial fluid collection.  PET 11/28/2020: Similar appearance of the right lower lobe mass with a high density central component and peripheral airspace opacity. The high density central component as a maximum SUV of 6.5, which is certainly suspicious for malignancy although a granulomatous infection could have a similar appearance. Tissue diagnosis recommended.  There is a new band of interstitial accentuation in the adjacent right upper lobe with maximum SUV of 2.7, likely inflammatory.  Biopsies of     Tobacco/Marijuana/Snuff/ETOH use: Current Smoker, couple every day.  Past/Anticipated interventions by cardiothoracic surgery, if any:   Past/Anticipated interventions by medical oncology, if any:  Cassie PA /Dr. Julien Nordmann 03/18/2021 -The patient was seen with Dr. Julien Nordmann today.  Dr. Julien Nordmann had a lengthy discussion with the patient today about his current condition and recommended treatment options based on the scan results. - Given the enlargement of his disease, Dr. Julien Nordmann does not believe he is a surgical candidate at this point.  Dr. Julien Nordmann recommends treating him as locally advanced disease with concurrent chemoradiation with carboplatin for an AUC of 2 and paclitaxel 45 mg per metered squared.  The patient is interested in this option and he is expected to receive his first dose of treatment on  04/01/2021.   PRIOR THERAPY: Neoadjuvant treatment with carboplatin for AUC of 5, Alimta 500 Mg/M2 and nivolumab 360 Mg IV every 3 weeks.  First dose January 23, 2021.  Status post 2 cycles.   CURRENT THERAPY: Concurrent chemoradiation with carboplatin for an AUC of 2 and paclitaxel 45 mg per metered squared.  First dose expected on 04/01/2021   Signs/Symptoms Weight changes, if any: Lost about 12 pounds over the past month.  States this is due to dizziness and decreased appetite. Respiratory complaints, if any: Reports mild SOB on occasion. Hemoptysis, if any: Reports congestion and productive cough with clear/yellow phlegm.  Denies hemoptysis. Pain issues, if any:  No  SAFETY ISSUES: Prior radiation? No Pacemaker/ICD? No Possible current pregnancy? N/a Is the patient on methotrexate? No  Current Complaints / other details:

## 2021-03-26 ENCOUNTER — Ambulatory Visit
Admission: RE | Admit: 2021-03-26 | Discharge: 2021-03-26 | Disposition: A | Discharge status: Home / Self Care | Payer: Medicare Other | Source: Ambulatory Visit | Attending: Radiation Oncology | Admitting: Radiation Oncology

## 2021-03-26 ENCOUNTER — Encounter: Payer: Self-pay | Admitting: Radiation Oncology

## 2021-03-26 ENCOUNTER — Other Ambulatory Visit: Payer: Self-pay

## 2021-03-26 VITALS — BP 141/68 | HR 93 | Temp 97.9°F | Resp 18 | Ht 72.0 in | Wt 206.1 lb

## 2021-03-26 DIAGNOSIS — K219 Gastro-esophageal reflux disease without esophagitis: Secondary | ICD-10-CM | POA: Insufficient documentation

## 2021-03-26 DIAGNOSIS — E785 Hyperlipidemia, unspecified: Secondary | ICD-10-CM | POA: Diagnosis not present

## 2021-03-26 DIAGNOSIS — C3491 Malignant neoplasm of unspecified part of right bronchus or lung: Secondary | ICD-10-CM

## 2021-03-26 DIAGNOSIS — E1136 Type 2 diabetes mellitus with diabetic cataract: Secondary | ICD-10-CM | POA: Diagnosis not present

## 2021-03-26 DIAGNOSIS — I10 Essential (primary) hypertension: Secondary | ICD-10-CM | POA: Insufficient documentation

## 2021-03-26 DIAGNOSIS — F17210 Nicotine dependence, cigarettes, uncomplicated: Secondary | ICD-10-CM | POA: Insufficient documentation

## 2021-03-26 DIAGNOSIS — I2510 Atherosclerotic heart disease of native coronary artery without angina pectoris: Secondary | ICD-10-CM | POA: Insufficient documentation

## 2021-03-26 DIAGNOSIS — Z79899 Other long term (current) drug therapy: Secondary | ICD-10-CM | POA: Insufficient documentation

## 2021-03-26 DIAGNOSIS — Z794 Long term (current) use of insulin: Secondary | ICD-10-CM | POA: Insufficient documentation

## 2021-03-26 DIAGNOSIS — Z87891 Personal history of nicotine dependence: Secondary | ICD-10-CM | POA: Diagnosis not present

## 2021-03-26 DIAGNOSIS — C3431 Malignant neoplasm of lower lobe, right bronchus or lung: Secondary | ICD-10-CM | POA: Insufficient documentation

## 2021-03-26 DIAGNOSIS — J432 Centrilobular emphysema: Secondary | ICD-10-CM | POA: Diagnosis not present

## 2021-03-26 DIAGNOSIS — E1110 Type 2 diabetes mellitus with ketoacidosis without coma: Secondary | ICD-10-CM | POA: Diagnosis not present

## 2021-03-26 DIAGNOSIS — I252 Old myocardial infarction: Secondary | ICD-10-CM | POA: Diagnosis not present

## 2021-03-26 DIAGNOSIS — Z7984 Long term (current) use of oral hypoglycemic drugs: Secondary | ICD-10-CM | POA: Insufficient documentation

## 2021-03-26 DIAGNOSIS — Z85828 Personal history of other malignant neoplasm of skin: Secondary | ICD-10-CM | POA: Diagnosis not present

## 2021-03-27 ENCOUNTER — Other Ambulatory Visit: Payer: Self-pay

## 2021-03-27 ENCOUNTER — Ambulatory Visit
Admission: RE | Admit: 2021-03-27 | Discharge: 2021-03-27 | Disposition: A | Discharge status: Home / Self Care | Payer: Medicare Other | Source: Ambulatory Visit | Attending: Radiation Oncology | Admitting: Radiation Oncology

## 2021-03-27 DIAGNOSIS — Z5111 Encounter for antineoplastic chemotherapy: Secondary | ICD-10-CM | POA: Diagnosis not present

## 2021-03-27 DIAGNOSIS — C3431 Malignant neoplasm of lower lobe, right bronchus or lung: Secondary | ICD-10-CM | POA: Insufficient documentation

## 2021-03-27 DIAGNOSIS — Z510 Encounter for antineoplastic radiation therapy: Secondary | ICD-10-CM | POA: Diagnosis not present

## 2021-03-27 DIAGNOSIS — Z79899 Other long term (current) drug therapy: Secondary | ICD-10-CM | POA: Diagnosis not present

## 2021-03-28 ENCOUNTER — Inpatient Hospital Stay: Payer: Medicare Other

## 2021-03-28 ENCOUNTER — Inpatient Hospital Stay: Payer: Medicare Other | Admitting: Physician Assistant

## 2021-03-29 DIAGNOSIS — C3431 Malignant neoplasm of lower lobe, right bronchus or lung: Secondary | ICD-10-CM | POA: Diagnosis not present

## 2021-03-29 DIAGNOSIS — Z510 Encounter for antineoplastic radiation therapy: Secondary | ICD-10-CM | POA: Diagnosis not present

## 2021-03-29 DIAGNOSIS — Z79899 Other long term (current) drug therapy: Secondary | ICD-10-CM | POA: Diagnosis not present

## 2021-03-29 DIAGNOSIS — Z5111 Encounter for antineoplastic chemotherapy: Secondary | ICD-10-CM | POA: Diagnosis not present

## 2021-03-29 MED FILL — Dexamethasone Sodium Phosphate Inj 100 MG/10ML: INTRAMUSCULAR | Qty: 1 | Status: AC

## 2021-03-29 NOTE — Progress Notes (Signed)
[place] OFFICE PROGRESS NOTE  Brandon Koch, MD [place] [id number]  DIAGNOSIS: Stage IIIA (T4, N0, M0) non-small cell lung cancer, favoring adenocarcinoma presented with large right lower lobe lung mass with no mediastinal lymphadenopathy or extrathoracic metastasis diagnosed in October 2022. Had disease progression with enlarging mass and new right hilar and mediastinal lymph nodes in December 2022.     PRIOR THERAPY: Neoadjuvant treatment with carboplatin for AUC of 5, Alimta 500 Mg/M2 and nivolumab 360 Mg IV every 3 weeks.  First dose January 23, 2021.  Status post 2 cycles.  CURRENT THERAPY: Concurrent chemoradiation with carboplatin for an AUC of 2 and paclitaxel 45 mg per metered squared.  First dose expected on 04/01/2021.   INTERVAL HISTORY: Brandon Rubio. 71 y.o. male returns to the clinic today for a follow-up visit.  The patient was last seen on 03/18/2021.  At that point time, the patient was recently discharged from the hospital. He was admitted for DKA and treated for possible pneumonia.  He had a restaging scan in the hospital which showed enlarging right upper lobe lung mass with extension into the right upper lobe and newly enlarged right hilar and mediastinal lymphadenopathy.  Therefore, when he was seen on 03/18/2021, we discussed changing treatment to concurrent chemoradiation.  He was feeling unwell at his last appointment with weight loss, decreased appetite, and weakness.  The patient states that he feels better today his only concern is brain fog since being discharged from the hospital.   He does report that it does get a little bit better every single day.  Regarding his diabetes, the patient met with his primary care provider who discussed how to monitor and take insulin.  He met with radiation oncology. He is starting radiation oncology. His last day of radiation is scheduled for 05/15/21.     Denies any fevers. He reports  a productive cough which produces clear phlegm.  He cut back from smoking 2.5 packs of cigarettes per day to smoking 6-8 cigarettes/day.  He reports mild shortness of breath with certain activities which is unchanged.  He denies any hemoptysis.  He was given a prescription for Norco at his last appointment for rib pain from his malignancy.  He states that he has not needed to take a lot of Norco and has not been having any more rib pain.  Denies any nausea, vomiting, diarrhea, or constipation.  Denies any headaches.  Denies any rashes or skin changes.  The patient is here today for evaluation and consideration of starting cycle #1 of his new chemotherapy.     MEDICAL HISTORY: Past Medical History:  Diagnosis Date   CAD (coronary artery disease)    Cancer ([place])    SKIN.[name]   Cataract    Diabetes mellitus    2   DVT (deep venous thrombosis) ([place]) 03/30/2001   RLE DVT, post ankle fracture   GERD (gastroesophageal reflux disease)    Hyperlipidemia    Hypertension    Lung cancer (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  MEDICATIONS:  Current Outpatient Medications  Medication Sig Dispense Refill   aspirin EC 81 MG tablet Take 81 mg by mouth in the morning. Swallow whole.     blood glucose meter kit and supplies KIT Dispense based on patient and insurance preference. Use up to four times daily as  directed. 1 each 0   empagliflozin (JARDIANCE) 25 MG TABS tablet Take 1 tablet (25 mg total) by mouth daily. 90 tablet 3   folic acid (FOLVITE) 1 MG tablet Take 1 tablet (1 mg total) by mouth daily. 30 tablet 4   glucose blood (FREESTYLE LITE) test strip USE TO TEST BLOOD SUGAR TWICE DAILY ICD E11.49 100 each 3   HYDROcodone-acetaminophen (NORCO) 5-325 MG tablet Take 1 tablet by mouth every 6 (six) hours as needed for moderate pain. 30 tablet 0   insulin glargine (LANTUS) 100 UNIT/ML Solostar Pen  Inject 15 Units into the skin daily. 4.5 mL 1   Insulin Pen Needle 31G X 5 MM MISC 1 each by Does not apply route daily at 6 (six) AM. 50 each 0   Melatonin 5 MG CHEW Chew 5 mg by mouth at bedtime.     metFORMIN (GLUCOPHAGE) 1000 MG tablet TAKE 1 TABLET BY MOUTH TWICE DAILY WITH A MEAL 180 tablet 1   metoprolol succinate (TOPROL-XL) 50 MG 24 hr tablet Take 1 tablet (50 mg total) by mouth daily. Take with or immediately following a meal. 90 tablet 1   Multiple Vitamin (MULTIVITAMIN WITH MINERALS) TABS tablet Take 1 tablet by mouth daily. Centrum Silver     nicotine (NICODERM CQ - DOSED IN MG/24 HOURS) 21 mg/24hr patch Place 1 patch (21 mg total) onto the skin daily. (Patient not taking: Reported on 03/26/2021) 28 patch 0   omeprazole (PRILOSEC) 20 MG capsule TAKE 1 CAPSULE(20 MG) BY MOUTH TWICE DAILY BEFORE A MEAL 180 capsule 3   prochlorperazine (COMPAZINE) 10 MG tablet Take 1 tablet (10 mg total) by mouth every 6 (six) hours as needed for nausea or vomiting. 30 tablet 0   simvastatin (ZOCOR) 40 MG tablet Take 1 tablet (40 mg total) by mouth daily at 6 PM. 90 tablet 3   traZODone (DESYREL) 50 MG tablet Take 1-2 tablets (50-100 mg total) by mouth at bedtime as needed for sleep. (Patient taking differently: Take 50-100 mg by mouth at bedtime as needed for sleep. Pt states taking two tablets) 180 tablet 0   triamcinolone cream (KENALOG) 0.1 % Apply 1 application topically 2 (two) times daily as needed (poison ivy).     No current facility-administered medications for this visit.    SURGICAL HISTORY:  Past Surgical History:  Procedure Laterality Date   BRONCHIAL BIOPSY  12/10/2020   Procedure: BRONCHIAL BIOPSIES;  Surgeon: Collene Gobble, MD;  Location[facility] ENDOSCOPY;  Service: Pulmonary;;   BRONCHIAL BRUSHINGS  12/10/2020   Procedure: BRONCHIAL BRUSHINGS;  Surgeon: Collene Gobble, MD;  Location: [facility] ENDOSCOPY;  Service: Pulmonary;;   BRONCHIAL NEEDLE ASPIRATION BIOPSY  12/10/2020   Procedure:  BRONCHIAL NEEDLE ASPIRATION BIOPSIES;  Surgeon: Collene Gobble, MD;  Location: [facility] ENDOSCOPY;  Service: Pulmonary;;   COLONOSCOPY     COLONOSCOPY W/ POLYPECTOMY  03/10/2009   LARYNGOSCOPY  03/10/1997   VIDEO BRONCHOSCOPY WITH ENDOBRONCHIAL NAVIGATION N/A 12/10/2020   Procedure: ROBOTIC [place];  Surgeon: Collene Gobble, MD;  Location: [facility] ENDOSCOPY;  Service: Pulmonary;  Laterality: N/A;    REVIEW OF SYSTEMS:   Review of Systems  Constitutional: Negative for appetite change, chills, fatigue, fever and unexpected weight change.  HENT: Negative for mouth sores, nosebleeds, sore throat and trouble swallowing.   Eyes: Negative for eye problems and icterus.  Respiratory: Positive for productive cough.  Positive for mild dyspnea on exertion.  Negative for hemoptysis and wheezing.   Cardiovascular:  Negative for chest pain and leg swelling.  Gastrointestinal: Negative for abdominal pain, constipation, diarrhea, nausea and vomiting.  Genitourinary: Negative for bladder incontinence, difficulty urinating, dysuria, frequency and hematuria.   Musculoskeletal: Negative for back pain, gait problem, neck pain and neck stiffness.  Skin: Negative for itching and rash.  Neurological: Positive for persistent but improving brain fog.  Negative for dizziness, extremity weakness, gait problem, headaches, light-headedness and seizures.  Hematological: Negative for adenopathy. Does not bruise/bleed easily.  Psychiatric/Behavioral: Negative for confusion, depression and sleep disturbance. The patient is not nervous/anxious.     PHYSICAL EXAMINATION:  Blood pressure 117/74, pulse 74, temperature (!) 97.5 F (36.4 C), temperature source Temporal, resp. rate 18, weight 208 lb 8 oz (94.6 kg), SpO2 95 %.  ECOG PERFORMANCE STATUS: 1  Physical Exam  Constitutional: Oriented to person, place, and time and well-developed, well-nourished, and in no distress.  HENT:  Head:  Normocephalic and atraumatic.  Mouth/Throat: Oropharynx is clear and moist. No oropharyngeal exudate.  Eyes: Conjunctivae are normal. Right eye exhibits no discharge. Left eye exhibits no discharge. No scleral icterus.  Neck: Normal range of motion. Neck supple.  Cardiovascular: Normal rate, regular rhythm, normal heart sounds and intact distal pulses.   Pulmonary/Chest: Effort normal and breath sounds normal. No respiratory distress. No wheezes. No rales.  Abdominal: Soft. Bowel sounds are normal. Exhibits no distension and no mass. There is no tenderness.  Musculoskeletal: Normal range of motion. Exhibits no edema.  Lymphadenopathy:    No cervical adenopathy.  Neurological: Alert and oriented to person, place, and time. Exhibits normal muscle tone. Gait normal. Coordination normal.  Skin: Skin is warm and dry. No rash noted. Not diaphoretic. No erythema. No pallor.  Psychiatric: Mood, memory and judgment normal.  Vitals reviewed.    LABORATORY DATA: Lab Results  Component Value Date   WBC 6.2 04/01/2021   HGB 12.5 (L) 04/01/2021   HCT 36.2 (L) 04/01/2021   MCV 91.0 04/01/2021   PLT 226 04/01/2021      Chemistry      Component Value Date/Time   NA 142 03/21/2021 0750   K 3.9 03/21/2021 0750   CL 105 03/21/2021 0750   CO2 27 03/21/2021 0750   BUN 10 03/21/2021 0750   CREATININE 0.85 03/21/2021 0750      Component Value Date/Time   CALCIUM 8.9 03/21/2021 0750   ALKPHOS 56 03/21/2021 0750   AST 17 03/21/2021 0750   ALT 26 03/21/2021 0750   BILITOT 0.3 03/21/2021 0750       RADIOGRAPHIC STUDIES:  DG Chest 2 View  Result Date: 03/07/2021 CLINICAL DATA:  Sepsis.  Cough. EXAM: CHEST - 2 VIEW COMPARISON:  December 11, 2020.  December 06, 2020. FINDINGS: The heart size and mediastinal contours are within normal limits. Left lung is clear. Increased right basilar opacity is noted concerning for increased mass and associated atelectasis or infiltrate. The visualized skeletal  structures are unremarkable. IMPRESSION: Increased right basilar opacity is noted concerning for increased mass and associated atelectasis or infiltrate. Electronically Signed   By: Marijo Conception M.D.   On: 03/07/2021 12:26   CT Chest W Contrast  Result Date: 03/07/2021 CLINICAL DATA:  History of non-small cell lung cancer EXAM: CT CHEST WITH CONTRAST TECHNIQUE: Multidetector CT imaging of the chest was performed during intravenous contrast administration. CONTRAST:  39m OMNIPAQUE IOHEXOL 300 MG/ML  SOLN COMPARISON:  CT chest dated November 16, 2020 FINDINGS: Cardiovascular: Normal heart size. No pericardial effusion. Atherosclerotic disease of the  thoracic aorta. Three-vessel coronary artery calcifications. Mediastinum/Nodes: New enlargement of hilar and mediastinal lymph nodes, increased in size when compared with prior exam. Reference subcarinal lymph node measuring 1.4 cm in short axis on series 2, image 70. Reference right hilar lymph node measuring 1.4 cm in short axis on series 2, image 74. Lungs/Pleura: Central airways are patent. Centrilobular emphysema. Interval increased size of right lower lobe mass, measures up to 9.5 x 6.0 cm in greatest axial dimension, previously 7.8 x 5.5 cm. Central hyperdensity is again seen within the mass, similar to prior exams. New ground-glass opacities are seen in the adjacent right lower lobe, concerning for tumor extension. Stable solid pulmonary nodule of the right upper lobe measuring 7 mm on series 4, image 80. Upper Abdomen: Low-attenuation lesion of the left kidney, likely a simple cyst. No acute abnormality. Musculoskeletal: No chest wall abnormality. No acute or significant osseous findings. IMPRESSION: 1. Interval increased size of right lower lobe mass with new possible extension into the adjacent right upper lobe. 2. New enlargement of right hilar and mediastinal lymph nodes, concerning for metastatic disease. 3. Aortic Atherosclerosis (ICD10-I70.0) and  Emphysema (ICD10-J43.9). Electronically Signed   By: Yetta Glassman M.D.   On: 03/07/2021 15:10     ASSESSMENT/PLAN:  This is a very pleasant 71 year old Caucasian male diagnosed with stage IIIa (T4, N1, M0) non-small cell lung cancer, favor to be adenocarcinoma.  He initially presented with a right lower lobe lung mass with no mediastinal lymphadenopathy or extrathoracic metastases.  He was diagnosed in October 2022.  He showed evidence of disease progression after 2 cycles of neoadjuvant treatment with enlarging right lower lobe lung mass and new enlarging right hilar mediastinal lymph nodes.  The patient underwent 2 cycles of neoadjuvant systemic chemotherapy with carboplatin for an AUC of 5, Alimta 500 mg per metered squared and nivolumab 360 mg IV every 3 weeks.  He status post 2 cycles.  He unfortunately had disease progression after cycle #2.   Therefore, he is currently undergoing treatment with concurrent chemoradiation. He is scheduled to start cycle #1 of carboplatin for an AUC of 2 and paclitaxel 45 mg/m2. He is scheduled to undergo cycle #1 today.   The patient was seen with Dr. Julien Nordmann. Labs were reviewed. Recommend that he proceed  with cycle #1 today as scheduled.  I reviewed with the patient at length to monitor his blood sugar closely at home and follow the instructions outlined by his primary care provider if he experiences hyperglycemia.  Discussed with him that he does receive 10 mg of Decadron as part of his premedication for his treatment.  We will see him back for a follow up visit in 2 weeks for evaluation before starting cycle #3.   He has a prescription for Norco if needed for right-sided rib pain and cancer-related pain if needed.  He has not needed to use this recently.  The patient was advised to call immediately if she has any concerning symptoms in the interval. The patient voices understanding of current disease status and treatment options and is in agreement  with the current care plan. All questions were answered. The patient knows to call the clinic with any problems, questions or concerns. We can certainly see the patient much sooner if necessary   No orders of the defined types were placed in this encounter.    Dasha Kawabata L Carisma Troupe, PA-C 04/01/21  ADDENDUM: Hematology/Oncology Attending: I had a face-to-face encounter with the patient today.  I reviewed his  record, lab and recommended his care plan.  This is a very pleasant 71 years old white male with history of a stage IIIa (T4, N1, M0) non-small cell lung cancer, adenocarcinoma presented with right lower lobe lung mass with a right hilar and no mediastinal lymphadenopathy and no evidence of extrathoracic disease.  The patient was diagnosed in October 2022 status post 3 cycles of neoadjuvant systemic chemotherapy with carboplatin, Alimta and nivolumab but unfortunately has no evidence for significant improvement of his disease and actually has some evidence for progression. The patient is here today to start the first cycle of concurrent chemoradiation with weekly carboplatin for AUC of 2 and paclitaxel 45 Mg/M2.  This will be concurrent with radiation. The patient is feeling much better and he would like to proceed with the treatment today as planned. He will come back for follow-up visit in 2 weeks for evaluation and management of any adverse effect of his treatment. The patient was advised to call immediately if he has any other concerning symptoms in the interval. Disclaimer: This note was dictated with voice recognition software. Similar sounding words can inadvertently be transcribed and may be missed upon review. Eilleen Kempf, MD 04/01/21

## 2021-04-01 ENCOUNTER — Ambulatory Visit
Admission: RE | Admit: 2021-04-01 | Discharge: 2021-04-01 | Disposition: A | Discharge status: Home / Self Care | Payer: Medicare Other | Source: Ambulatory Visit | Attending: Radiation Oncology | Admitting: Radiation Oncology

## 2021-04-01 ENCOUNTER — Inpatient Hospital Stay: Payer: Medicare Other

## 2021-04-01 ENCOUNTER — Other Ambulatory Visit: Payer: Self-pay

## 2021-04-01 ENCOUNTER — Inpatient Hospital Stay (HOSPITAL_BASED_OUTPATIENT_CLINIC_OR_DEPARTMENT_OTHER): Payer: Medicare Other | Admitting: Physician Assistant

## 2021-04-01 VITALS — BP 117/74 | HR 74 | Temp 97.5°F | Resp 18 | Wt 208.5 lb

## 2021-04-01 VITALS — BP 136/73 | HR 69 | Resp 18

## 2021-04-01 DIAGNOSIS — Z5111 Encounter for antineoplastic chemotherapy: Secondary | ICD-10-CM

## 2021-04-01 DIAGNOSIS — Z510 Encounter for antineoplastic radiation therapy: Secondary | ICD-10-CM | POA: Diagnosis not present

## 2021-04-01 DIAGNOSIS — C3431 Malignant neoplasm of lower lobe, right bronchus or lung: Secondary | ICD-10-CM | POA: Diagnosis not present

## 2021-04-01 DIAGNOSIS — C3491 Malignant neoplasm of unspecified part of right bronchus or lung: Secondary | ICD-10-CM

## 2021-04-01 DIAGNOSIS — Z79899 Other long term (current) drug therapy: Secondary | ICD-10-CM | POA: Diagnosis not present

## 2021-04-01 LAB — CMP (CANCER CENTER ONLY)
ALT: 18 U/L (ref 0–44)
AST: 18 U/L (ref 15–41)
Albumin: 3.9 g/dL (ref 3.5–5.0)
Alkaline Phosphatase: 49 U/L (ref 38–126)
Anion gap: 10 (ref 5–15)
BUN: 20 mg/dL (ref 8–23)
CO2: 23 mmol/L (ref 22–32)
Calcium: 8.8 mg/dL — ABNORMAL LOW (ref 8.9–10.3)
Chloride: 103 mmol/L (ref 98–111)
Creatinine: 0.81 mg/dL (ref 0.61–1.24)
GFR, Estimated: >60 mL/min (ref >60)
Glucose, Bld: 141 mg/dL — ABNORMAL HIGH (ref 70–99)
Potassium: 3.9 mmol/L (ref 3.5–5.1)
Sodium: 136 mmol/L (ref 135–145)
Total Bilirubin: 0.3 mg/dL (ref 0.3–1.2)
Total Protein: 7.6 g/dL (ref 6.5–8.1)

## 2021-04-01 LAB — CBC WITH DIFFERENTIAL (CANCER CENTER ONLY)
Abs Immature Granulocytes: 0.02 10*3/uL (ref 0.00–0.07)
Basophils Absolute: 0.1 10*3/uL (ref 0.0–0.1)
Basophils Relative: 2 %
Eosinophils Absolute: 0.3 10*3/uL (ref 0.0–0.5)
Eosinophils Relative: 4 %
HCT: 36.2 % — ABNORMAL LOW (ref 39.0–52.0)
Hemoglobin: 12.5 g/dL — ABNORMAL LOW (ref 13.0–17.0)
Immature Granulocytes: 0 %
Lymphocytes Relative: 30 %
Lymphs Abs: 1.9 10*3/uL (ref 0.7–4.0)
MCH: 31.4 pg (ref 26.0–34.0)
MCHC: 34.5 g/dL (ref 30.0–36.0)
MCV: 91 fL (ref 80.0–100.0)
Monocytes Absolute: 0.5 10*3/uL (ref 0.1–1.0)
Monocytes Relative: 9 %
Neutro Abs: 3.4 10*3/uL (ref 1.7–7.7)
Neutrophils Relative %: 55 %
Platelet Count: 226 10*3/uL (ref 150–400)
RBC: 3.98 MIL/uL — ABNORMAL LOW (ref 4.22–5.81)
RDW: 15.8 % — ABNORMAL HIGH (ref 11.5–15.5)
WBC Count: 6.2 10*3/uL (ref 4.0–10.5)
nRBC: 0 % (ref 0.0–0.2)

## 2021-04-01 MED ORDER — SODIUM CHLORIDE 0.9 % IV SOLN
10.0000 mg | Freq: Once | INTRAVENOUS | Status: AC
  Administered 2021-04-01: 10 mg via INTRAVENOUS
  Filled 2021-04-01: qty 10

## 2021-04-01 MED ORDER — FAMOTIDINE 20 MG IN NS 100 ML IVPB
20.0000 mg | Freq: Once | INTRAVENOUS | Status: AC
  Administered 2021-04-01: 20 mg via INTRAVENOUS
  Filled 2021-04-01: qty 100

## 2021-04-01 MED ORDER — PALONOSETRON HCL INJECTION 0.25 MG/5ML
0.2500 mg | Freq: Once | INTRAVENOUS | Status: AC
  Administered 2021-04-01: 0.25 mg via INTRAVENOUS
  Filled 2021-04-01: qty 5

## 2021-04-01 MED ORDER — SODIUM CHLORIDE 0.9 % IV SOLN: Freq: Once | INTRAVENOUS | Status: AC

## 2021-04-01 MED ORDER — SODIUM CHLORIDE 0.9 % IV SOLN
45.0000 mg/m2 | Freq: Once | INTRAVENOUS | Status: AC
  Administered 2021-04-01: 96 mg via INTRAVENOUS
  Filled 2021-04-01: qty 16

## 2021-04-01 MED ORDER — DIPHENHYDRAMINE HCL 50 MG/ML IJ SOLN
50.0000 mg | Freq: Once | INTRAMUSCULAR | Status: AC
  Administered 2021-04-01: 50 mg via INTRAVENOUS
  Filled 2021-04-01: qty 1

## 2021-04-01 MED ORDER — SODIUM CHLORIDE 0.9 % IV SOLN
225.6000 mg | Freq: Once | INTRAVENOUS | Status: AC
  Administered 2021-04-01: 230 mg via INTRAVENOUS
  Filled 2021-04-01: qty 23

## 2021-04-01 NOTE — Patient Instructions (Signed)
[facility] MEDICAL ONCOLOGY  Discharge Instructions: Thank you for choosing [facility] to provide your oncology and hematology care.   If you have a lab appointment with the Cancer Center, please go directly to the Cancer Center and check in at the registration area.   Wear comfortable clothing and clothing appropriate for easy access to any Portacath or PICC line.   We strive to give you quality time with your provider. You may need to reschedule your appointment if you arrive late (15 or more minutes).  Arriving late affects you and other patients whose appointments are after yours.  Also, if you miss three or more appointments without notifying the office, you may be dismissed from the clinic at the provider's discretion.      For prescription refill requests, have your pharmacy contact our office and allow 72 hours for refills to be completed.    Today you received the following chemotherapy and/or immunotherapy agents: Paclitaxel (Taxol) and Carboplatin.   To help prevent nausea and vomiting after your treatment, we encourage you to take your nausea medication as directed.  BELOW ARE SYMPTOMS THAT SHOULD BE REPORTED IMMEDIATELY: *FEVER GREATER THAN 100.4 F (38 C) OR HIGHER *CHILLS OR SWEATING *NAUSEA AND VOMITING THAT IS NOT CONTROLLED WITH YOUR NAUSEA MEDICATION *UNUSUAL SHORTNESS OF BREATH *UNUSUAL BRUISING OR BLEEDING *URINARY PROBLEMS (pain or burning when urinating, or frequent urination) *BOWEL PROBLEMS (unusual diarrhea, constipation, pain near the anus) TENDERNESS IN MOUTH AND THROAT WITH OR WITHOUT PRESENCE OF ULCERS (sore throat, sores in mouth, or a toothache) UNUSUAL RASH, SWELLING OR PAIN  UNUSUAL VAGINAL DISCHARGE OR ITCHING   Items with * indicate a potential emergency and should be followed up as soon as possible or go to the Emergency Department if any problems should occur.  Please show the CHEMOTHERAPY ALERT CARD or IMMUNOTHERAPY  ALERT CARD at check-in to the Emergency Department and triage nurse.  Should you have questions after your visit or need to cancel or reschedule your appointment, please contact [facility] MEDICAL [facility]COL[facility]  Dept: [phone]  and follow the prompts.  Office hours are 8:00 a.m. to 4:30 p.m. Monday - Friday. Please note that voicemails left after 4:00 p.m. may not be returned until the following business day.  We are closed weekends and major holidays. You have access to a nurse at all times for urgent questions. Please call the main number to the clinic Dept: [phone] and follow the prompts.   For any non-urgent questions, you may also contact your provider using MyChart. We now offer e-Visits for anyone 18 and older to request care online for non-urgent symptoms. For details visit [email or web address].   Also download the MyChart app! Go to the app store, search "MyChart", open the app, select [facility], and log in with your MyChart username and password.  Due to Covid, a mask is required upon entering the hospital/clinic. If you do not have a mask, one will be given to you upon arrival. For doctor visits, patients may have 1 support person aged 18 or older with them. For treatment visits, patients cannot have anyone with them due to current Covid guidelines and our immunocompromised population.   

## 2021-04-02 ENCOUNTER — Ambulatory Visit
Admission: RE | Admit: 2021-04-02 | Discharge: 2021-04-02 | Disposition: A | Discharge status: Home / Self Care | Payer: Medicare Other | Source: Ambulatory Visit | Attending: Radiation Oncology | Admitting: Radiation Oncology

## 2021-04-02 DIAGNOSIS — Z510 Encounter for antineoplastic radiation therapy: Secondary | ICD-10-CM | POA: Diagnosis not present

## 2021-04-02 DIAGNOSIS — C3431 Malignant neoplasm of lower lobe, right bronchus or lung: Secondary | ICD-10-CM | POA: Diagnosis not present

## 2021-04-02 DIAGNOSIS — Z5111 Encounter for antineoplastic chemotherapy: Secondary | ICD-10-CM | POA: Diagnosis not present

## 2021-04-02 DIAGNOSIS — Z79899 Other long term (current) drug therapy: Secondary | ICD-10-CM | POA: Diagnosis not present

## 2021-04-03 ENCOUNTER — Ambulatory Visit (INDEPENDENT_AMBULATORY_CARE_PROVIDER_SITE_OTHER): Payer: Medicare Other

## 2021-04-03 ENCOUNTER — Other Ambulatory Visit: Payer: Self-pay

## 2021-04-03 ENCOUNTER — Ambulatory Visit
Admission: RE | Admit: 2021-04-03 | Discharge: 2021-04-03 | Disposition: A | Discharge status: Home / Self Care | Payer: Medicare Other | Source: Ambulatory Visit | Attending: Radiation Oncology | Admitting: Radiation Oncology

## 2021-04-03 DIAGNOSIS — E118 Type 2 diabetes mellitus with unspecified complications: Secondary | ICD-10-CM

## 2021-04-03 DIAGNOSIS — I2510 Atherosclerotic heart disease of native coronary artery without angina pectoris: Secondary | ICD-10-CM

## 2021-04-03 DIAGNOSIS — C3431 Malignant neoplasm of lower lobe, right bronchus or lung: Secondary | ICD-10-CM | POA: Diagnosis not present

## 2021-04-03 DIAGNOSIS — E1169 Type 2 diabetes mellitus with other specified complication: Secondary | ICD-10-CM

## 2021-04-03 DIAGNOSIS — Z5111 Encounter for antineoplastic chemotherapy: Secondary | ICD-10-CM | POA: Diagnosis not present

## 2021-04-03 DIAGNOSIS — F17200 Nicotine dependence, unspecified, uncomplicated: Secondary | ICD-10-CM

## 2021-04-03 DIAGNOSIS — I10 Essential (primary) hypertension: Secondary | ICD-10-CM

## 2021-04-03 DIAGNOSIS — Z510 Encounter for antineoplastic radiation therapy: Secondary | ICD-10-CM | POA: Diagnosis not present

## 2021-04-03 DIAGNOSIS — Z79899 Other long term (current) drug therapy: Secondary | ICD-10-CM | POA: Diagnosis not present

## 2021-04-03 NOTE — Patient Instructions (Signed)
Visit Information  Following are the goals we discussed today:   Track and Manage My Blood Sugars   Timeframe:  Long-Range Goal Priority:  High Start Date:        07/19/20                     Expected End Date:    02/06/22                  Follow Up Date March 2023   -Continue metformin and Jardiance as prescribed -continue lantus 15 units daily  -Reduce sugar and carbs in diet -Increase exercise to goal of 150 min/week   Why is this important?   Checking your blood sugar at home helps to keep it from getting very high or very low.  Writing the results in a diary or log helps the doctor know how to care for you.  Your blood sugar log should have the time, date and the results.  Also, write down the amount of insulin or other medicine that you take.  Other information, like what you ate, exercise done and how you were feeling, will also be helpful.    Plan: Telephone follow up appointment with care management team member scheduled for:  2 months  The patient has been provided with contact information for the care management team and has been advised to call with any health related questions or concerns.   Tomasa Blase, PharmD Clinical Pharmacist, [facility]   Please call the care guide team at [phone] if you need to cancel or reschedule your appointment.   The patient verbalized understanding of instructions, educational materials, and care plan provided today and agreed to receive a mailed copy of patient instructions, educational materials, and care plan.

## 2021-04-03 NOTE — Progress Notes (Signed)
Chronic Care Management Pharmacy Note  04/03/2021 Name:  Cobin Cadavid. MRN:  [id number] DOB:  1951-01-21   Summary: -Patient reports that since starting lantus BG has been well controlled, checking BG every morning and is averaging 125-135 -reports that he has not yet started nicotine patches - notes he has reduced to <1ppd (from 3 ppd) -Plan is for chemo and radiation to be completed 05/15/2021  Recommendations/Changes made from today's visit: -Recommended no changes to medications, patient to continue to monitor BG at least once daily - to reach out should BG control be lost prior to next appointment -Plan to follow up with patient after radiation and chemo are completed - likely insulin requirements will change after completion of treatment course, patient aware to reach out to discuss medication concerns if needed    Subjective: Icker Swigert. is an 71 y.o. year old male who is a primary patient of [name], MD.  The CCM team was consulted for assistance with disease management and care coordination needs.    Engaged with patient by telephone for follow up visit in response to provider referral for pharmacy case management and/or care coordination services.   Consent to Services:  The patient was given information about Chronic Care Management services, agreed to services, and gave verbal consent prior to initiation of services.  Please see initial visit note for detailed documentation.   Patient Care Team: [name], MD as PCP - General (Internal Medicine) Milus Banister, MD as Attending Physician (Gastroenterology) Valrie Hart, RN as Oncology Nurse Navigator (Oncology) Tomasa Blase, [facility] as Pharmacist (Pharmacist)  Recent office visits: 03/19/2021 - Dr. [name] - hospital follow up (DKA) - reviewed insulin use and recommendations   Recent consult visits: 04/01/2021 - Cassandra Heilingoetter PA-C - Oncology - new chemotherapy  treatment started - carboplatin and paclitaxel 5m/m2 with concurrent radiation - follow up in 2 weeks  03/18/2021 - Cassandra Heilingoetter PA-C - change of chemotherapy to carboplatin and paclitaxel -expected first dose 04/01/2021 - referral to radiation oncology placed - follow up in 2 weeks  02/14/2021 - Dr. MJulien Nordmann- Oncology - evaluation before cycle 2 of current treatment - carboplatin, alimta, and nivolumab every 3 weeks  02/06/2021 - Dr. MJulien Nordmann- Oncology - recommended to proceed with cycle 2 of current treatment - carboplatin, alimta, and nivolumab every 3 weeks  01/16/2021 - DNorton BlizzardRN - oncology treatment  01/16/2021 - Dr. MJulien Nordmann- Oncology - proceed with course of carboplatin, alimta, and nivolumab every 3 weeks   Hospital visits: 03[facility]07[facility]2021-03[facility]09[facility] nonproductive cough  - fever - noted DKA - PNA ruled out   Objective:  Lab Results  Component Value Date   CREATININE 0.81 04/01/2021   BUN 20 04/01/2021   GFR 77.05 10/02/2020   GFRNONAA >60 04/01/2021   GFRAA  12/14/2007    >60        The eGFR has been calculated using the MDRD equation. This calculation has not been validated in all clinical   NA 136 04/01/2021   K 3.9 04/01/2021   CALCIUM 8.8 (L) 04/01/2021   CO2 23 04/01/2021   GLUCOSE 141 (H) 04/01/2021    Lab Results  Component Value Date/Time   HGBA1C 9.2 (H) 03/07/2021 09:31 PM   HGBA1C 8.3 (A) 10/02/2020 08:09 AM   HGBA1C 8.7 (H) 06/14/2020 10:16 AM   GFR 77.05 10/02/2020 08:26 AM   GFR 79.15 06/14/2020 10:16 AM   MICROALBUR 1.4  06/14/2020 10:16 AM   MICROALBUR 0.8 08/11/2016 09:55 AM    Last diabetic Eye exam:  Lab Results  Component Value Date/Time   HMDIABEYEEXA No Retinopathy 06/28/2020 10:45 AM    Last diabetic Foot exam: No results found for: HMDIABFOOTEX   Lab Results  Component Value Date   CHOL 129 10/02/2020   HDL 35.30 (L) 10/02/2020   LDLCALC 50 11/22/2012   LDLDIRECT 62.0 10/02/2020   TRIG 258.0  (H) 10/02/2020   CHOLHDL 4 10/02/2020    Hepatic Function Latest Ref Rng & Units 04/01/2021 03/21/2021 03/18/2021  Total Protein 6.5 - 8.1 g/dL 7.6 7.1 7.1  Albumin 3.5 - 5.0 g/dL 3.9 3.6 3.4(L)  AST 15 - 41 U/L 18 17 52(H)  ALT 0 - 44 U/L '18 26 29  ' Alk Phosphatase 38 - 126 U/L 49 56 69  Total Bilirubin 0.3 - 1.2 mg/dL 0.3 0.3 0.4  Bilirubin, Direct 0.0 - 0.3 mg/dL - - -    Lab Results  Component Value Date/Time   TSH 1.391 03/18/2021 07:46 AM   TSH 1.170 02/14/2021 07:40 AM   TSH 1.22 06/22/2014 08:10 AM   TSH 1.14 08/24/2012 10:03 AM    CBC Latest Ref Rng & Units 04/01/2021 03/21/2021 03/18/2021  WBC 4.0 - 10.5 K/uL 6.2 7.0 8.6  Hemoglobin 13.0 - 17.0 g/dL 12.5(L) 11.2(L) 11.2(L)  Hematocrit 39.0 - 52.0 % 36.2(L) 33.4(L) 33.5(L)  Platelets 150 - 400 K/uL 226 357 370    No results found for: VD25OH  Clinical ASCVD: Yes  The ASCVD Risk score (Arnett DK, et al., 2019) failed to calculate for the following reasons:   The valid total cholesterol range is 130 to 320 mg/dL    Depression screen [facility] 2/9 06/14/2020 01/27/2019 09/02/2017  Decreased Interest 0 0 0  Down, Depressed, Hopeless 0 0 0  PHQ - 2 Score 0 0 0     Social History   Tobacco Use  Smoking Status Every Day   Packs/day: 1.00   Years: 58.00   Pack years: 58.00   Types: Cigarettes  Smokeless Tobacco Never  Tobacco Comments   2+ packs smoked daily ARJ 12/05/20   BP Readings from Last 3 Encounters:  04/01/21 136/73  04/01/21 117/74  03/26/21 (!) 141/68   Pulse Readings from Last 3 Encounters:  04/01/21 69  04/01/21 74  03/26/21 93   Wt Readings from Last 3 Encounters:  04/01/21 208 lb 8 oz (94.6 kg)  03/26/21 206 lb 2 oz (93.5 kg)  03/19/21 206 lb 6.4 oz (93.6 kg)   BMI Readings from Last 3 Encounters:  04/01/21 28.28 kg/m  03/26/21 27.96 kg/m  03/19/21 27.99 kg/m    Assessment/Interventions: Review of patient past medical history, allergies, medications, health status, including review of  consultants reports, laboratory and other test data, was performed as part of comprehensive evaluation and provision of chronic care management services.   SDOH:  (Social Determinants of Health) assessments and interventions performed: Yes  SDOH Screenings   Alcohol Screen: Not on file  Depression (PHQ2-9): Low Risk    PHQ-2 Score: 0  Financial Resource Strain: Not on file  Food Insecurity: Not on file  Housing: Not on file  Physical Activity: Not on file  Social Connections: Not on file  Stress: Not on file  Tobacco Use: High Risk   Smoking Tobacco Use: Every Day   Smokeless Tobacco Use: Never   Passive Exposure: Not on file  Transportation Needs: Not on file    [place]  Plan  Allergies  Allergen Reactions   Penicillins Swelling    REACTION: throat swelling Because of a history of documented adverse serious drug reaction;[name] Alert bracelet  is recommended    Medications Reviewed Today     Reviewed by Tomasa Blase, [facility] (Pharmacist) on 04/03/21 at [place] List Status: <None>   Medication Order Taking? Sig Documenting Provider Last Dose Status Informant  aspirin EC 81 MG tablet [id number] Yes Take 81 mg by mouth in the morning. Swallow whole. [provider] Taking Active Self  blood glucose meter kit and supplies KIT [id number] Yes Dispense based on patient and insurance preference. Use up to four times daily as directed. Antonieta Pert, MD Taking Active   empagliflozin (JARDIANCE) 25 MG TABS tablet [id number] Yes Take 1 tablet (25 mg total) by mouth daily. [name], MD Taking Active   folic acid (FOLVITE) 1 MG tablet [id number] Yes Take 1 tablet (1 mg total) by mouth daily. Curt Bears, MD Taking Active Self  glucose blood (FREESTYLE LITE) test strip [id number] Yes USE TO TEST BLOOD SUGAR TWICE DAILY ICD E11.49 Hendricks Limes, MD Taking Active Self  HYDROcodone-acetaminophen [facility]) 5-325 MG tablet [id number] Yes Take 1 tablet by mouth every 6 (six)  hours as needed for moderate pain. Heilingoetter, [name], PA-C Taking Active   insulin glargine (LANTUS) 100 UNIT/ML Solostar Pen [id number] Yes Inject 15 Units into the skin daily. Antonieta Pert, MD Taking Active   Insulin Pen Needle 31G X 5 MM MISC [id number] Yes 1 each by Does not apply route daily at 6 (six) AM. Antonieta Pert, MD Taking Active   Melatonin 5 MG CHEW [id number] Yes Chew 5 mg by mouth at bedtime. [provider] Taking Active Self  metFORMIN (GLUCOPHAGE) 1000 MG tablet [id number] Yes TAKE 1 TABLET BY MOUTH TWICE DAILY WITH A MEAL [name], MD Taking Active Self  metoprolol succinate (TOPROL-XL) 50 MG 24 hr tablet [id number] Yes Take 1 tablet (50 mg total) by mouth daily. Take with or immediately following a meal. [name], MD Taking Active Self  Multiple Vitamin (MULTIVITAMIN WITH MINERALS) TABS tablet [id number] Yes Take 1 tablet by mouth daily. Centrum Silver [provider] Taking Active Self  nicotine (NICODERM CQ - DOSED IN MG/24 HOURS) 21 mg/24hr patch [id number] No Place 1 patch (21 mg total) onto the skin daily.  Patient not taking: Reported on 03/26/2021   [name], MD Not Taking Active Self           Med Note Eino Farber   Thu Mar 07, 2021  1:19 PM) Hasn't used yet  omeprazole (PRILOSEC) 20 MG capsule [id number] Yes TAKE 1 CAPSULE(20 MG) BY MOUTH TWICE DAILY BEFORE A MEAL [name], MD Taking Active Self  prochlorperazine (COMPAZINE) 10 MG tablet [id number] Yes Take 1 tablet (10 mg total) by mouth every 6 (six) hours as needed for nausea or vomiting. Curt Bears, MD Taking Active Self           Med Note Nigel Mormon, Mickel Crow   Thu Mar 07, 2021  1:19 PM) prn  simvastatin (ZOCOR) 40 MG tablet [id number] Yes Take 1 tablet (40 mg total) by mouth daily at 6 PM. [name], MD Taking Active Self  traZODone (DESYREL) 50 MG tablet [id number] Yes Take 1-2 tablets (50-100 mg total) by mouth at bedtime  as needed for sleep.  Patient taking differently: Take 50-100 mg by mouth at bedtime as needed for sleep. Pt  states taking two tablets   [name], MD Taking Active Self           Med Note Nigel Mormon, Mickel Crow   Thu Mar 07, 2021  1:19 PM) prn  triamcinolone cream (KENALOG) 0.1 % [id number] Yes Apply 1 application topically 2 (two) times daily as needed (poison ivy). Collene Gobble, MD Taking Active Self           Med Note Eino Farber   Thu Mar 07, 2021  1:20 PM) prn            Patient Active Problem List   Diagnosis Date Noted   Goals of care, counseling/discussion 03/18/2021   Pseudohyponatremia 03/07/2021   Dehydration 03/07/2021   SOB (shortness of breath) 03/07/2021   Encounter for antineoplastic chemotherapy 01/16/2021   Encounter for antineoplastic immunotherapy 01/16/2021   Adenocarcinoma of right lung, stage 3 ([place]) 12/24/2020   Hemoptysis 12/11/2020   Rash 12/11/2020   Right lower lobe lung mass 12/05/2020   Insomnia 10/05/2020   Routine general medical examination at a health care facility 08/13/2016   Neck pain 11/26/2012   GERD (gastroesophageal reflux disease) 09/05/2010   Diabetes mellitus type 2 with complications ([place]) 80/05/4915   Smokers' cough ([place]) 09/25/2008   SKIN CANCER, HX OF 09/25/2008   Essential hypertension 03/20/2008   CAD (coronary artery disease) 03/20/2008   Hyperlipidemia associated with type 2 diabetes mellitus ([place]) 12/23/2007    Immunization History  Administered Date(s) Administered   Fluad Quad(high Dose 65+) 01/16/2021   Influenza, High Dose Seasonal PF 11/18/2017, 11/23/2018   Influenza,inj,Quad PF,6+ Mos 05/11/2015   Influenza-Unspecified 01/02/2013, 01/03/2014, 12/23/2018   PFIZER(Purple Top)SARS-COV-2 Vaccination 06/14/2019, 07/10/2019, 02/21/2020   Pneumococcal Conjugate-13 09/02/2017   Tdap 11/18/2017   Zoster, Live 01/03/2014    Conditions to be addressed/monitored:  Hypertension, Hyperlipidemia,  Diabetes, Coronary Artery Disease and Tobacco use  Care Plan : [place]  Updates made by Tomasa Blase, RPH since 04/03/2021 12:00 AM     Problem: Hypertension, Hyperlipidemia, Diabetes, Coronary Artery Disease and Tobacco use   Priority: High     Long-Range Goal: Disease management   Start Date: 07/19/2020  Expected End Date: 04/03/2022  This Visit's Progress: On track  Recent Progress: On track  Priority: High  Note:   Current Barriers:  Unable to independently monitor therapeutic efficacy Unable to achieve control of diabetes, insomnia  Pharmacist Clinical Goal(s):  Patient will achieve adherence to monitoring guidelines and medication adherence to achieve therapeutic efficacy achieve control of diabetes as evidenced by improved A1c < 8% through collaboration with PharmD and provider.   Interventions: 1:1 collaboration with [name], MD regarding development and update of comprehensive plan of care as evidenced by provider attestation and co-signature Inter-disciplinary care team collaboration (see longitudinal plan of care) Comprehensive medication review performed; medication list updated in electronic medical record  Hypertension    BP goal < 130/80 Patient does not monitor BP at home - has been controlled in office    BP Readings from Last 3 Encounters:  04/01/21 136/73  04/01/21 117/74  03/26/21 (!) 141/68  Patient has failed these meds in the past: n/a Patient is currently controlled on the following medications:  metoprolol succinate 50 mg daily   We discussed:  BP goals; benefits if medication     Plan: Continue current medications   Hyperlipidemia / CAD  Controlled  LDL goal < 70 Last LDL: 62 CAD: NSTEMI 2009, medically managed, no stents.  Pt declined changing to atorvastatin 09/2020  Patient has failed these meds in past: n/a Patient is currently controlled on the following medications:  simvastatin 40 mg HS aspirin 81 mg  daily   We discussed:  LDL is at goal; pt endorses compliance with statin   Plan: Continue current medications   Diabetes   Improved since starting lantus  A1c goal < 8% Lab Results  Component Value Date   HGBA1C 9.2 (H) 03/07/2021  Checking BG: Never Pt declined additional meds 09/2020   Patient has failed these meds in past: n/a Patient is currently uncontrolled on the following medications:  metformin 1000 mg BID Jardiance 25 mg daily Lantus 15 units daily    We discussed: BG since starting lantus 15 units daily has been well controlled, patient reports averaging 125-135 in the AM - lowest was 85-90 (occurred x 1) - Last chemo /radiation planned for 05/15/2021 - discussed that insulin requirements will likely change / not be needed after completion of therapy, will plan to discuss BG with patient shortly after completion of treatment    Plan: Recommend to continue current medication   Insomnia   Patient has failed these meds in past: n/a Patient is currently uncontrolled on the following medications:  Trazodone 100 mg HS prn Melatonin 25m hs prn   We discussed:  Pt reports he goes to bed ~9pm, wakes up every night at 1-2 am, falls back asleep around 4-5 am and sleeps for a few hours; he reports no issues falling asleep Plan: continue current medications   Tobacco Use    Currently smoking <1 ppd. - Previously had been smoking 3ppd  First cigarette within 30 min of waking. Previous quit attempts: Quit for 8-9 months about 20 yrs ago, [place]  We discussed:  Smoking cessation, patient notes that he has received patched but has not started, patient prefers to continue to try to wean cigarettes - aware he can still try patches / advised to call 1-800-QUIT-NOW  Plan: Continue with reduction on cigarette use / trial of nicotine 243mpatch daily       Patient Goals/Self-Care Activities Patient will:  - take medications as prescribed -focus on medication adherence by  routine -target a minimum of 150 minutes of moderate intensity exercise weekly -engage in dietary modifications by reducing sugar, carbs     Care Gaps: Colonoscopy (due 2/722) - scheduled for 12/31/20  [id number] [place] [address]one: [phone]: [phone] Uses pill box? Yes Pt endorses 100% compliance  We discussed: Current pharmacy is preferred with insurance plan and patient is satisfied with pharmacy services Patient decided to: Continue current medication management strategy  Care Plan and Follow Up Patient Decision:  Patient agrees to Care Plan and Follow-up.  Plan: Telephone follow up appointment with care management team member scheduled for:  3 months  DaTomasa BlasePharmD Clinical Pharmacist, [place]

## 2021-04-04 ENCOUNTER — Ambulatory Visit
Admission: RE | Admit: 2021-04-04 | Discharge: 2021-04-04 | Disposition: A | Discharge status: Home / Self Care | Payer: Medicare Other | Source: Ambulatory Visit | Attending: Radiation Oncology | Admitting: Radiation Oncology

## 2021-04-04 ENCOUNTER — Other Ambulatory Visit: Payer: Medicare Other

## 2021-04-04 DIAGNOSIS — Z79899 Other long term (current) drug therapy: Secondary | ICD-10-CM | POA: Diagnosis not present

## 2021-04-04 DIAGNOSIS — C3431 Malignant neoplasm of lower lobe, right bronchus or lung: Secondary | ICD-10-CM | POA: Diagnosis not present

## 2021-04-04 DIAGNOSIS — Z510 Encounter for antineoplastic radiation therapy: Secondary | ICD-10-CM | POA: Diagnosis not present

## 2021-04-04 DIAGNOSIS — Z5111 Encounter for antineoplastic chemotherapy: Secondary | ICD-10-CM | POA: Diagnosis not present

## 2021-04-04 NOTE — Progress Notes (Signed)
Pt here for patient teaching.  Pt given Radiation and You booklet, skin care instructions, and Sonafine.  Reviewed areas of pertinence such as fatigue, hair loss, skin changes, throat changes, cough, and shortness of breath . Pt able to give teach back of to pat skin and use unscented/gentle soap,apply Sonafine bid and avoid applying anything to skin within 4 hours of treatment. Pt verbalizes understanding of information given and will contact nursing with any questions or concerns.     Gloriajean Dell. Leonie Green, BSN

## 2021-04-05 ENCOUNTER — Ambulatory Visit
Admission: RE | Admit: 2021-04-05 | Discharge: 2021-04-05 | Disposition: A | Discharge status: Home / Self Care | Payer: Medicare Other | Source: Ambulatory Visit | Attending: Radiation Oncology | Admitting: Radiation Oncology

## 2021-04-05 DIAGNOSIS — Z5111 Encounter for antineoplastic chemotherapy: Secondary | ICD-10-CM | POA: Diagnosis not present

## 2021-04-05 DIAGNOSIS — Z79899 Other long term (current) drug therapy: Secondary | ICD-10-CM | POA: Diagnosis not present

## 2021-04-05 DIAGNOSIS — Z510 Encounter for antineoplastic radiation therapy: Secondary | ICD-10-CM | POA: Diagnosis not present

## 2021-04-05 DIAGNOSIS — C3431 Malignant neoplasm of lower lobe, right bronchus or lung: Secondary | ICD-10-CM | POA: Diagnosis not present

## 2021-04-05 DIAGNOSIS — C3491 Malignant neoplasm of unspecified part of right bronchus or lung: Secondary | ICD-10-CM

## 2021-04-05 MED ORDER — SONAFINE EX EMUL
1.0000 "application " | Freq: Once | CUTANEOUS | Status: AC
  Administered 2021-04-05: 1 via TOPICAL

## 2021-04-05 MED FILL — Dexamethasone Sodium Phosphate Inj 100 MG/10ML: INTRAMUSCULAR | Qty: 1 | Status: AC

## 2021-04-08 ENCOUNTER — Inpatient Hospital Stay: Payer: Medicare Other

## 2021-04-08 ENCOUNTER — Ambulatory Visit
Admission: RE | Admit: 2021-04-08 | Discharge: 2021-04-08 | Disposition: A | Discharge status: Home / Self Care | Payer: Medicare Other | Source: Ambulatory Visit | Attending: Radiation Oncology | Admitting: Radiation Oncology

## 2021-04-08 ENCOUNTER — Other Ambulatory Visit: Payer: Self-pay

## 2021-04-08 VITALS — BP 129/72 | HR 95 | Temp 98.2°F | Resp 18 | Wt 207.8 lb

## 2021-04-08 DIAGNOSIS — C3491 Malignant neoplasm of unspecified part of right bronchus or lung: Secondary | ICD-10-CM

## 2021-04-08 DIAGNOSIS — Z79899 Other long term (current) drug therapy: Secondary | ICD-10-CM | POA: Diagnosis not present

## 2021-04-08 DIAGNOSIS — Z510 Encounter for antineoplastic radiation therapy: Secondary | ICD-10-CM | POA: Diagnosis not present

## 2021-04-08 DIAGNOSIS — C3431 Malignant neoplasm of lower lobe, right bronchus or lung: Secondary | ICD-10-CM | POA: Diagnosis not present

## 2021-04-08 DIAGNOSIS — Z5111 Encounter for antineoplastic chemotherapy: Secondary | ICD-10-CM | POA: Diagnosis not present

## 2021-04-08 LAB — CBC WITH DIFFERENTIAL (CANCER CENTER ONLY)
Abs Immature Granulocytes: 0.05 10*3/uL (ref 0.00–0.07)
Basophils Absolute: 0.1 10*3/uL (ref 0.0–0.1)
Basophils Relative: 1 %
Eosinophils Absolute: 0.2 10*3/uL (ref 0.0–0.5)
Eosinophils Relative: 3 %
HCT: 38.1 % — ABNORMAL LOW (ref 39.0–52.0)
Hemoglobin: 12.8 g/dL — ABNORMAL LOW (ref 13.0–17.0)
Immature Granulocytes: 1 %
Lymphocytes Relative: 23 %
Lymphs Abs: 1.6 10*3/uL (ref 0.7–4.0)
MCH: 31.4 pg (ref 26.0–34.0)
MCHC: 33.6 g/dL (ref 30.0–36.0)
MCV: 93.6 fL (ref 80.0–100.0)
Monocytes Absolute: 0.5 10*3/uL (ref 0.1–1.0)
Monocytes Relative: 7 %
Neutro Abs: 4.3 10*3/uL (ref 1.7–7.7)
Neutrophils Relative %: 65 %
Platelet Count: 196 10*3/uL (ref 150–400)
RBC: 4.07 MIL/uL — ABNORMAL LOW (ref 4.22–5.81)
RDW: 15.9 % — ABNORMAL HIGH (ref 11.5–15.5)
WBC Count: 6.7 10*3/uL (ref 4.0–10.5)
nRBC: 0 % (ref 0.0–0.2)

## 2021-04-08 LAB — CMP (CANCER CENTER ONLY)
ALT: 14 U/L (ref 0–44)
AST: 12 U/L — ABNORMAL LOW (ref 15–41)
Albumin: 4.3 g/dL (ref 3.5–5.0)
Alkaline Phosphatase: 49 U/L (ref 38–126)
Anion gap: 9 (ref 5–15)
BUN: 16 mg/dL (ref 8–23)
CO2: 23 mmol/L (ref 22–32)
Calcium: 9.3 mg/dL (ref 8.9–10.3)
Chloride: 105 mmol/L (ref 98–111)
Creatinine: 0.89 mg/dL (ref 0.61–1.24)
GFR, Estimated: >60 mL/min (ref >60)
Glucose, Bld: 179 mg/dL — ABNORMAL HIGH (ref 70–99)
Potassium: 4.1 mmol/L (ref 3.5–5.1)
Sodium: 137 mmol/L (ref 135–145)
Total Bilirubin: 0.4 mg/dL (ref 0.3–1.2)
Total Protein: 7.5 g/dL (ref 6.5–8.1)

## 2021-04-08 MED ORDER — SODIUM CHLORIDE 0.9 % IV SOLN: Freq: Once | INTRAVENOUS | Status: AC

## 2021-04-08 MED ORDER — SODIUM CHLORIDE 0.9 % IV SOLN
45.0000 mg/m2 | Freq: Once | INTRAVENOUS | Status: AC
  Administered 2021-04-08: 96 mg via INTRAVENOUS
  Filled 2021-04-08: qty 16

## 2021-04-08 MED ORDER — SODIUM CHLORIDE 0.9 % IV SOLN
225.6000 mg | Freq: Once | INTRAVENOUS | Status: AC
  Administered 2021-04-08: 230 mg via INTRAVENOUS
  Filled 2021-04-08: qty 23

## 2021-04-08 MED ORDER — DIPHENHYDRAMINE HCL 50 MG/ML IJ SOLN
50.0000 mg | Freq: Once | INTRAMUSCULAR | Status: AC
  Administered 2021-04-08: 50 mg via INTRAVENOUS
  Filled 2021-04-08: qty 1

## 2021-04-08 MED ORDER — SODIUM CHLORIDE 0.9 % IV SOLN
10.0000 mg | Freq: Once | INTRAVENOUS | Status: AC
  Administered 2021-04-08: 10 mg via INTRAVENOUS
  Filled 2021-04-08: qty 10

## 2021-04-08 MED ORDER — PALONOSETRON HCL INJECTION 0.25 MG/5ML
0.2500 mg | Freq: Once | INTRAVENOUS | Status: AC
  Administered 2021-04-08: 0.25 mg via INTRAVENOUS
  Filled 2021-04-08: qty 5

## 2021-04-08 MED ORDER — FAMOTIDINE IN NACL 20-0.9 MG/50ML-% IV SOLN
20.0000 mg | Freq: Once | INTRAVENOUS | Status: AC
  Administered 2021-04-08: 20 mg via INTRAVENOUS
  Filled 2021-04-08: qty 50

## 2021-04-09 ENCOUNTER — Ambulatory Visit
Admission: RE | Admit: 2021-04-09 | Discharge: 2021-04-09 | Disposition: A | Discharge status: Home / Self Care | Payer: Medicare Other | Source: Ambulatory Visit | Attending: Radiation Oncology | Admitting: Radiation Oncology

## 2021-04-09 DIAGNOSIS — Z794 Long term (current) use of insulin: Secondary | ICD-10-CM

## 2021-04-09 DIAGNOSIS — I2510 Atherosclerotic heart disease of native coronary artery without angina pectoris: Secondary | ICD-10-CM | POA: Diagnosis not present

## 2021-04-09 DIAGNOSIS — Z7984 Long term (current) use of oral hypoglycemic drugs: Secondary | ICD-10-CM

## 2021-04-09 DIAGNOSIS — E1159 Type 2 diabetes mellitus with other circulatory complications: Secondary | ICD-10-CM | POA: Diagnosis not present

## 2021-04-09 DIAGNOSIS — E785 Hyperlipidemia, unspecified: Secondary | ICD-10-CM

## 2021-04-09 DIAGNOSIS — F17210 Nicotine dependence, cigarettes, uncomplicated: Secondary | ICD-10-CM | POA: Diagnosis not present

## 2021-04-09 DIAGNOSIS — C3431 Malignant neoplasm of lower lobe, right bronchus or lung: Secondary | ICD-10-CM | POA: Diagnosis not present

## 2021-04-09 DIAGNOSIS — I10 Essential (primary) hypertension: Secondary | ICD-10-CM | POA: Diagnosis not present

## 2021-04-09 DIAGNOSIS — Z79899 Other long term (current) drug therapy: Secondary | ICD-10-CM | POA: Diagnosis not present

## 2021-04-09 DIAGNOSIS — Z510 Encounter for antineoplastic radiation therapy: Secondary | ICD-10-CM | POA: Diagnosis not present

## 2021-04-09 DIAGNOSIS — Z5111 Encounter for antineoplastic chemotherapy: Secondary | ICD-10-CM | POA: Diagnosis not present

## 2021-04-10 ENCOUNTER — Other Ambulatory Visit: Payer: Self-pay

## 2021-04-10 ENCOUNTER — Ambulatory Visit
Admission: RE | Admit: 2021-04-10 | Discharge: 2021-04-10 | Disposition: A | Discharge status: Home / Self Care | Payer: Medicare Other | Source: Ambulatory Visit | Attending: Radiation Oncology | Admitting: Radiation Oncology

## 2021-04-10 ENCOUNTER — Telehealth: Payer: Self-pay

## 2021-04-10 DIAGNOSIS — C3431 Malignant neoplasm of lower lobe, right bronchus or lung: Secondary | ICD-10-CM | POA: Diagnosis not present

## 2021-04-10 DIAGNOSIS — Z79899 Other long term (current) drug therapy: Secondary | ICD-10-CM | POA: Diagnosis not present

## 2021-04-10 DIAGNOSIS — J9811 Atelectasis: Secondary | ICD-10-CM | POA: Diagnosis not present

## 2021-04-10 DIAGNOSIS — Z510 Encounter for antineoplastic radiation therapy: Secondary | ICD-10-CM | POA: Insufficient documentation

## 2021-04-10 DIAGNOSIS — E860 Dehydration: Secondary | ICD-10-CM | POA: Diagnosis not present

## 2021-04-10 DIAGNOSIS — Z5111 Encounter for antineoplastic chemotherapy: Secondary | ICD-10-CM | POA: Insufficient documentation

## 2021-04-10 DIAGNOSIS — I951 Orthostatic hypotension: Secondary | ICD-10-CM | POA: Diagnosis not present

## 2021-04-10 DIAGNOSIS — R531 Weakness: Secondary | ICD-10-CM | POA: Diagnosis not present

## 2021-04-10 NOTE — Chronic Care Management (AMB) (Signed)
Chronic Care Management Pharmacy Assistant   Name: Brandon Rubio.  MRN: [id number] DOB: [birth year]   Reason for Encounter: Disease State-General Call    Recent office visits:  Nine ID  Recent consult visits:  None ID  Hospital visits:  None since last coordination call on 04/03/21  Medications: Outpatient Encounter Medications as of 04/10/2021  Medication Sig Note   aspirin EC 81 MG tablet Take 81 mg by mouth in the morning. Swallow whole.    blood glucose meter kit and supplies KIT Dispense based on patient and insurance preference. Use up to four times daily as directed.    empagliflozin (JARDIANCE) 25 MG TABS tablet Take 1 tablet (25 mg total) by mouth daily.    folic acid (FOLVITE) 1 MG tablet Take 1 tablet (1 mg total) by mouth daily.    glucose blood (FREESTYLE LITE) test strip USE TO TEST BLOOD SUGAR TWICE DAILY ICD E11.49    HYDROcodone-acetaminophen (NORCO) 5-325 MG tablet Take 1 tablet by mouth every 6 (six) hours as needed for moderate pain.    insulin glargine (LANTUS) 100 UNIT/ML Solostar Pen Inject 15 Units into the skin daily.    Melatonin 5 MG CHEW Chew 5 mg by mouth at bedtime.    metFORMIN (GLUCOPHAGE) 1000 MG tablet TAKE 1 TABLET BY MOUTH TWICE DAILY WITH A MEAL    metoprolol succinate (TOPROL-XL) 50 MG 24 hr tablet Take 1 tablet (50 mg total) by mouth daily. Take with or immediately following a meal.    Multiple Vitamin (MULTIVITAMIN WITH MINERALS) TABS tablet Take 1 tablet by mouth daily. Centrum Silver    nicotine (NICODERM CQ - DOSED IN MG/24 HOURS) 21 mg/24hr patch Place 1 patch (21 mg total) onto the skin daily. (Patient not taking: Reported on 03/26/2021) 03/07/2021: Hasn't used yet   omeprazole (PRILOSEC) 20 MG capsule TAKE 1 CAPSULE(20 MG) BY MOUTH TWICE DAILY BEFORE A MEAL    prochlorperazine (COMPAZINE) 10 MG tablet Take 1 tablet (10 mg total) by mouth every 6 (six) hours as needed for nausea or vomiting. 03/07/2021: prn   simvastatin (ZOCOR) 40  MG tablet Take 1 tablet (40 mg total) by mouth daily at 6 PM.    traZODone (DESYREL) 50 MG tablet Take 1-2 tablets (50-100 mg total) by mouth at bedtime as needed for sleep. (Patient taking differently: Take 50-100 mg by mouth at bedtime as needed for sleep. Pt states taking two tablets) 03/07/2021: prn   triamcinolone cream (KENALOG) 0.1 % Apply 1 application topically 2 (two) times daily as needed (poison ivy). 03/07/2021: prn   No facility-administered encounter medications on file as of 04/10/2021.   Have you had any problems recently with your health?Patient states that he is doing fine and he has not had any changes to his health since he last talked wit Linna Hoff  Have you had any problems with your pharmacy?Patient states he is not having any problems with getting medications or the cost of medications from the pharmacy  What issues or side effects are you having with your medications?Patient has not side effects from medications  What would you like me to pass along to [facility] for them to help you with?Patient states that he is doing well   What can we do to take care of you better? Patient states he does not need anything at this time  Care Gaps: Colonoscopy-12/31/20 Diabetic Foot Exam-06/14/20 Ophthalmology-06/09/20 Dexa Scan - NA Annual Well Visit - NA Micro albumin-06/14/20 Hemoglobin A1c- 03/07/21  Star  Rating Drugs: Simvastatin 40 mg-last fill 12/23/20 90 ds Metformin 1000 mg-last fill 12/24/20 90 ds  [place] Pharmacist Assistant [phone]

## 2021-04-11 ENCOUNTER — Other Ambulatory Visit: Payer: Medicare Other

## 2021-04-11 ENCOUNTER — Ambulatory Visit
Admission: RE | Admit: 2021-04-11 | Discharge: 2021-04-11 | Disposition: A | Discharge status: Home / Self Care | Payer: Medicare Other | Source: Ambulatory Visit | Attending: Radiation Oncology | Admitting: Radiation Oncology

## 2021-04-11 DIAGNOSIS — C3431 Malignant neoplasm of lower lobe, right bronchus or lung: Secondary | ICD-10-CM | POA: Diagnosis not present

## 2021-04-11 DIAGNOSIS — Z510 Encounter for antineoplastic radiation therapy: Secondary | ICD-10-CM | POA: Diagnosis not present

## 2021-04-12 ENCOUNTER — Ambulatory Visit
Admission: RE | Admit: 2021-04-12 | Discharge: 2021-04-12 | Disposition: A | Discharge status: Home / Self Care | Payer: Medicare Other | Source: Ambulatory Visit | Attending: Radiation Oncology | Admitting: Radiation Oncology

## 2021-04-12 ENCOUNTER — Other Ambulatory Visit: Payer: Self-pay

## 2021-04-12 DIAGNOSIS — Z510 Encounter for antineoplastic radiation therapy: Secondary | ICD-10-CM | POA: Diagnosis not present

## 2021-04-12 DIAGNOSIS — C3431 Malignant neoplasm of lower lobe, right bronchus or lung: Secondary | ICD-10-CM | POA: Diagnosis not present

## 2021-04-12 MED FILL — Dexamethasone Sodium Phosphate Inj 100 MG/10ML: INTRAMUSCULAR | Qty: 1 | Status: AC

## 2021-04-15 ENCOUNTER — Other Ambulatory Visit: Payer: Self-pay

## 2021-04-15 ENCOUNTER — Ambulatory Visit
Admission: RE | Admit: 2021-04-15 | Discharge: 2021-04-15 | Disposition: A | Discharge status: Home / Self Care | Payer: Medicare Other | Source: Ambulatory Visit | Attending: Radiation Oncology | Admitting: Radiation Oncology

## 2021-04-15 ENCOUNTER — Inpatient Hospital Stay: Payer: Medicare Other

## 2021-04-15 ENCOUNTER — Inpatient Hospital Stay (HOSPITAL_BASED_OUTPATIENT_CLINIC_OR_DEPARTMENT_OTHER): Payer: Medicare Other | Admitting: Internal Medicine

## 2021-04-15 ENCOUNTER — Encounter: Payer: Self-pay | Admitting: *Deleted

## 2021-04-15 VITALS — BP 131/80 | HR 81 | Temp 97.0°F | Resp 20 | Ht 72.0 in | Wt 210.0 lb

## 2021-04-15 DIAGNOSIS — C3431 Malignant neoplasm of lower lobe, right bronchus or lung: Secondary | ICD-10-CM | POA: Insufficient documentation

## 2021-04-15 DIAGNOSIS — Z5111 Encounter for antineoplastic chemotherapy: Secondary | ICD-10-CM | POA: Insufficient documentation

## 2021-04-15 DIAGNOSIS — Z79899 Other long term (current) drug therapy: Secondary | ICD-10-CM | POA: Insufficient documentation

## 2021-04-15 DIAGNOSIS — C3491 Malignant neoplasm of unspecified part of right bronchus or lung: Secondary | ICD-10-CM | POA: Diagnosis not present

## 2021-04-15 DIAGNOSIS — D696 Thrombocytopenia, unspecified: Secondary | ICD-10-CM | POA: Insufficient documentation

## 2021-04-15 DIAGNOSIS — Z510 Encounter for antineoplastic radiation therapy: Secondary | ICD-10-CM | POA: Diagnosis not present

## 2021-04-15 LAB — CBC WITH DIFFERENTIAL (CANCER CENTER ONLY)
Abs Immature Granulocytes: 0.01 10*3/uL (ref 0.00–0.07)
Basophils Absolute: 0 10*3/uL (ref 0.0–0.1)
Basophils Relative: 1 %
Eosinophils Absolute: 0.1 10*3/uL (ref 0.0–0.5)
Eosinophils Relative: 2 %
HCT: 37.5 % — ABNORMAL LOW (ref 39.0–52.0)
Hemoglobin: 12.6 g/dL — ABNORMAL LOW (ref 13.0–17.0)
Immature Granulocytes: 0 %
Lymphocytes Relative: 21 %
Lymphs Abs: 1 10*3/uL (ref 0.7–4.0)
MCH: 31.4 pg (ref 26.0–34.0)
MCHC: 33.6 g/dL (ref 30.0–36.0)
MCV: 93.5 fL (ref 80.0–100.0)
Monocytes Absolute: 0.4 10*3/uL (ref 0.1–1.0)
Monocytes Relative: 8 %
Neutro Abs: 3.4 10*3/uL (ref 1.7–7.7)
Neutrophils Relative %: 68 %
Platelet Count: 151 10*3/uL (ref 150–400)
RBC: 4.01 MIL/uL — ABNORMAL LOW (ref 4.22–5.81)
RDW: 15.9 % — ABNORMAL HIGH (ref 11.5–15.5)
WBC Count: 4.9 10*3/uL (ref 4.0–10.5)
nRBC: 0 % (ref 0.0–0.2)

## 2021-04-15 LAB — CMP (CANCER CENTER ONLY)
ALT: 13 U/L (ref 0–44)
AST: 12 U/L — ABNORMAL LOW (ref 15–41)
Albumin: 4.3 g/dL (ref 3.5–5.0)
Alkaline Phosphatase: 48 U/L (ref 38–126)
Anion gap: 10 (ref 5–15)
BUN: 16 mg/dL (ref 8–23)
CO2: 23 mmol/L (ref 22–32)
Calcium: 9.1 mg/dL (ref 8.9–10.3)
Chloride: 103 mmol/L (ref 98–111)
Creatinine: 0.8 mg/dL (ref 0.61–1.24)
GFR, Estimated: >60 mL/min (ref >60)
Glucose, Bld: 129 mg/dL — ABNORMAL HIGH (ref 70–99)
Potassium: 4.2 mmol/L (ref 3.5–5.1)
Sodium: 136 mmol/L (ref 135–145)
Total Bilirubin: 0.4 mg/dL (ref 0.3–1.2)
Total Protein: 7.3 g/dL (ref 6.5–8.1)

## 2021-04-15 MED ORDER — SODIUM CHLORIDE 0.9 % IV SOLN
10.0000 mg | Freq: Once | INTRAVENOUS | Status: AC
  Administered 2021-04-15: 10 mg via INTRAVENOUS
  Filled 2021-04-15: qty 10

## 2021-04-15 MED ORDER — FAMOTIDINE IN NACL 20-0.9 MG/50ML-% IV SOLN
20.0000 mg | Freq: Once | INTRAVENOUS | Status: AC
  Administered 2021-04-15: 20 mg via INTRAVENOUS
  Filled 2021-04-15: qty 50

## 2021-04-15 MED ORDER — DIPHENHYDRAMINE HCL 50 MG/ML IJ SOLN
50.0000 mg | Freq: Once | INTRAMUSCULAR | Status: AC
  Administered 2021-04-15: 50 mg via INTRAVENOUS
  Filled 2021-04-15: qty 1

## 2021-04-15 MED ORDER — PALONOSETRON HCL INJECTION 0.25 MG/5ML
0.2500 mg | Freq: Once | INTRAVENOUS | Status: AC
  Administered 2021-04-15: 0.25 mg via INTRAVENOUS
  Filled 2021-04-15: qty 5

## 2021-04-15 MED ORDER — SODIUM CHLORIDE 0.9 % IV SOLN
45.0000 mg/m2 | Freq: Once | INTRAVENOUS | Status: AC
  Administered 2021-04-15: 96 mg via INTRAVENOUS
  Filled 2021-04-15: qty 16

## 2021-04-15 MED ORDER — SODIUM CHLORIDE 0.9 % IV SOLN
225.6000 mg | Freq: Once | INTRAVENOUS | Status: AC
  Administered 2021-04-15: 230 mg via INTRAVENOUS
  Filled 2021-04-15: qty 23

## 2021-04-15 MED ORDER — SODIUM CHLORIDE 0.9 % IV SOLN: Freq: Once | INTRAVENOUS | Status: AC

## 2021-04-15 NOTE — Progress Notes (Signed)
[place] [phone]   [phone]  OFFICE PROGRESS NOTE  [name], MD [place] [address]  DIAGNOSIS: Stage IIIA (T4, N0, M0) non-small cell lung cancer, favoring adenocarcinoma presented with large right lower lobe lung mass with no mediastinal lymphadenopathy or extrathoracic metastasis diagnosed in October 2022. Had disease progression with enlarging mass and new right hilar and mediastinal lymph nodes in December 2022.      PRIOR THERAPY: Neoadjuvant treatment with carboplatin for AUC of 5, Alimta 500 Mg/M2 and nivolumab 360 Mg IV every 3 weeks.  First dose January 23, 2021.  Status post 3 cycles.   CURRENT THERAPY: Concurrent chemoradiation with carboplatin for an AUC of 2 and paclitaxel 45 mg per metered squared.  First dose expected on 04/01/2021.  Status post 2 cycles  INTERVAL HISTORY: Brandon Rubio. 71 y.o. male returns to the clinic today for follow-up visit.  The patient is feeling fine today with no concerning complaints.  He is tolerating his treatment with concurrent chemoradiation fairly well.  He denied having any chest pain, shortness of breath, cough or hemoptysis.  He denied having any fever or chills.  He has no nausea, vomiting, diarrhea or constipation.  He has no headache or visual changes.  He has no significant weight loss or night sweats.  He is here today for evaluation before starting cycle #3 of his treatment.  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  MEDICATIONS:  Current Outpatient Medications  Medication Sig Dispense Refill   aspirin EC 81 MG tablet Take 81 mg by mouth in the morning. Swallow whole.     blood glucose meter kit and supplies KIT Dispense based on patient and insurance preference. Use up to four times daily as directed. 1 each 0   empagliflozin (JARDIANCE) 25 MG TABS tablet Take 1 tablet (25 mg total) by mouth daily. 90 tablet 3   folic acid (FOLVITE) 1 MG tablet Take 1 tablet (1 mg total) by mouth daily. 30 tablet 4   glucose blood (FREESTYLE LITE) test strip USE TO TEST BLOOD SUGAR TWICE DAILY ICD E11.49 100 each 3   HYDROcodone-acetaminophen (NORCO) 5-325 MG tablet Take 1 tablet by mouth every 6 (six) hours as needed for moderate pain. 30 tablet 0   insulin glargine (LANTUS) 100 UNIT/ML Solostar Pen Inject 15 Units into the skin daily. 4.5 mL 1   Melatonin 5 MG CHEW Chew 5 mg by mouth at bedtime.     metFORMIN (GLUCOPHAGE) 1000 MG tablet TAKE 1 TABLET BY MOUTH TWICE DAILY WITH A MEAL 180 tablet 1   metoprolol succinate (TOPROL-XL) 50 MG 24 hr tablet Take 1 tablet (50 mg total) by mouth daily. Take with or immediately following a meal. 90 tablet 1   Multiple Vitamin (MULTIVITAMIN WITH MINERALS) TABS tablet Take 1 tablet by mouth daily. Centrum Silver     nicotine (NICODERM CQ - DOSED IN MG/24 HOURS) 21 mg/24hr  patch Place 1 patch (21 mg total) onto the skin daily. (Patient not taking: Reported on 03/26/2021) 28 patch 0   omeprazole (PRILOSEC) 20 MG capsule TAKE 1 CAPSULE(20 MG) BY MOUTH TWICE DAILY BEFORE A MEAL 180 capsule 3   prochlorperazine (COMPAZINE) 10 MG tablet Take 1 tablet (10 mg total) by mouth every 6 (six) hours as needed for nausea or vomiting. 30 tablet 0   simvastatin (ZOCOR) 40 MG tablet Take 1 tablet (40 mg total) by mouth daily at 6 PM. 90 tablet 3   traZODone (DESYREL) 50 MG tablet Take 1-2 tablets (50-100 mg total) by mouth at bedtime as needed for sleep. (Patient taking differently: Take  50-100 mg by mouth at bedtime as needed for sleep. Pt states taking two tablets) 180 tablet 0   triamcinolone cream (KENALOG) 0.1 % Apply 1 application topically 2 (two) times daily as needed (poison ivy).     No current facility-administered medications for this visit.    SURGICAL HISTORY:  Past Surgical History:  Procedure Laterality Date   BRONCHIAL BIOPSY  12/10/2020   Procedure: BRONCHIAL BIOPSIES;  Surgeon: Collene Gobble, MD;  Location: [facility] ENDOSCOPY;  Service: Pulmonary;;   BRONCHIAL BRUSHINGS  12/10/2020   Procedure: BRONCHIAL BRUSHINGS;  Surgeon: Collene Gobble, MD;  Location: [facility] ENDOSCOPY;  Service: Pulmonary;;   BRONCHIAL NEEDLE ASPIRATION BIOPSY  12/10/2020   Procedure: BRONCHIAL NEEDLE ASPIRATION BIOPSIES;  Surgeon: Collene Gobble, MD;  Location: [facility] ENDOSCOPY;  Service: Pulmonary;;   COLONOSCOPY     COLONOSCOPY W/ POLYPECTOMY  03/10/2009   LARYNGOSCOPY  03/10/1997   VIDEO BRONCHOSCOPY WITH ENDOBRONCHIAL NAVIGATION N/A 12/10/2020   Procedure: ROBOTIC [place];  Surgeon: Collene Gobble, MD;  Location: [facility] ENDOSCOPY;  Service: Pulmonary;  Laterality: N/A;    REVIEW OF SYSTEMS:  A comprehensive review of systems was negative.   PHYSICAL EXAMINATION: General appearance: alert, cooperative, and no distress Head: Normocephalic, without obvious abnormality, atraumatic Neck: no adenopathy, no JVD, supple, symmetrical, trachea midline, and thyroid not enlarged, symmetric, no tenderness/mass/nodules Lymph nodes: Cervical, supraclavicular, and axillary nodes normal. Resp: clear to auscultation bilaterally Back: symmetric, no curvature. ROM normal. No CVA tenderness. Cardio: regular rate and rhythm, S1, S2 normal, no murmur, click, rub or gallop GI: soft, non-tender; bowel sounds normal; no masses,  no organomegaly Extremities: extremities normal, atraumatic, no cyanosis or edema  ECOG PERFORMANCE STATUS: 1 - Symptomatic but completely  ambulatory  Blood pressure 131/80, pulse 81, temperature (!) 97 F (36.1 C), temperature source Tympanic, resp. rate 20, height 6' (1.829 m), weight 210 lb (95.3 kg), SpO2 97 %.  LABORATORY DATA: Lab Results  Component Value Date   WBC 4.9 04/15/2021   HGB 12.6 (L) 04/15/2021   HCT 37.5 (L) 04/15/2021   MCV 93.5 04/15/2021   PLT 151 04/15/2021      Chemistry      Component Value Date/Time   NA 136 04/15/2021 0841   K 4.2 04/15/2021 0841   CL 103 04/15/2021 0841   CO2 23 04/15/2021 0841   BUN 16 04/15/2021 0841   CREATININE 0.80 04/15/2021 0841      Component Value Date/Time   CALCIUM 9.1 04/15/2021 0841   ALKPHOS 48 04/15/2021 0841   AST 12 (L) 04/15/2021 0841   ALT 13 04/15/2021 0841   BILITOT 0.4 04/15/2021 0841       RADIOGRAPHIC STUDIES: No results found.  ASSESSMENT AND PLAN: This is a very pleasant 71 years old white male with history  of a stage IIIa (T4, N1, M0) non-small cell lung cancer, adenocarcinoma presented with right lower lobe lung mass with a right hilar and no mediastinal lymphadenopathy and no evidence of extrathoracic disease.  The patient was diagnosed in October 2022 status post 3 cycles of neoadjuvant systemic chemotherapy with carboplatin, Alimta and nivolumab but unfortunately has no evidence for significant improvement of his disease and actually has some evidence for progression. The patient is currently undergoing a course of concurrent chemoradiation weekly carboplatin for AUC of 2 and paclitaxel 45 Mg/M2.  Status post 2 cycles. The patient continues to tolerate this treatment well with no concerning adverse effects. I recommended for him to proceed with cycle #3 today as planned. He will come back for follow-up visit in 2 weeks for evaluation before starting cycle #5. The patient was advised to call immediately if he has any other concerning symptoms in the interval. The patient voices understanding of current disease status and treatment  options and is in agreement with the current care plan.  All questions were answered. The patient knows to call the clinic with any problems, questions or concerns. We can certainly see the patient much sooner if necessary.  The total time spent in the appointment was 20 minutes.  Disclaimer: This note was dictated with voice recognition software. Similar sounding words can inadvertently be transcribed and may not be corrected upon review.

## 2021-04-15 NOTE — Progress Notes (Signed)
Oncology Nurse Navigator Documentation  Oncology Nurse Navigator Flowsheets 04/15/2021 01/24/2021 01/18/2021 01/16/2021  Abnormal Finding Date - - - 11/05/2020  Confirmed Diagnosis Date - - - 12/10/2020  Diagnosis Status - - - Confirmed Diagnosis Complete  Planned Course of Treatment Chemo/Radiation Concurrent - Neo Chemo;Targeted Therapy Neo Chemo;Targeted Therapy  Phase of Treatment Radiation - Targeted Therapy Targeted Therapy  Chemotherapy Actual Start Date: - - 01/24/2021 -  Radiation Actual Start Date: 03/27/2021 - - -  Targeted Therapy Actual Start Date: - - 01/24/2021 -  Navigator Follow Up Date: 04/29/2021 02/06/2021 01/21/2021 01/18/2021  Navigator Follow Up Reason: Follow-up Appointment Follow-up Appointment Education Appointment Review  Navigator Location [facility]  Navigator Encounter Type Clinic/MDC Other: Telephone Clinic/MDC  Telephone - - Education;Outgoing Call;Asess Navigation Needs -  Treatment Initiated Date - - 01/24/2021 -  Patient Visit Type Follow-up;MedOnc Other Other MedOnc  Treatment Phase Treatment Treatment Pre-Tx/Tx Discussion Pre-Tx/Tx Discussion  Barriers/Navigation Needs Education/I saw Mr. Friberg today.  He is doing well on his therapy.  I helped to explain his plan of care. He verbalized understanding.   Coordination of Care Coordination of Care;Education Education  Education Other - Other Newly Diagnosed Cancer Education;Other  Interventions Psycho-Social Support;Education Coordination of Care Coordination of Care;Education;Psycho-Social Support Education;None Required  Acuity Level 2-Minimal Needs (1-2 Barriers Identified) Level 2-Minimal Needs (1-2 Barriers Identified) Level 2-Minimal Needs (1-2 Barriers Identified) Level 2-Minimal Needs (1-2 Barriers Identified)  Coordination of Care - Pathology - -  Education Method Verbal - Verbal Verbal;Written  Time Spent with Patient 30 30 15  30

## 2021-04-16 ENCOUNTER — Ambulatory Visit
Admission: RE | Admit: 2021-04-16 | Discharge: 2021-04-16 | Disposition: A | Discharge status: Home / Self Care | Payer: Medicare Other | Source: Ambulatory Visit | Attending: Radiation Oncology | Admitting: Radiation Oncology

## 2021-04-16 DIAGNOSIS — Z510 Encounter for antineoplastic radiation therapy: Secondary | ICD-10-CM | POA: Diagnosis not present

## 2021-04-16 DIAGNOSIS — C3431 Malignant neoplasm of lower lobe, right bronchus or lung: Secondary | ICD-10-CM | POA: Diagnosis not present

## 2021-04-17 ENCOUNTER — Encounter: Payer: Self-pay | Admitting: *Deleted

## 2021-04-17 ENCOUNTER — Other Ambulatory Visit: Payer: Self-pay

## 2021-04-17 ENCOUNTER — Ambulatory Visit
Admission: RE | Admit: 2021-04-17 | Discharge: 2021-04-17 | Disposition: A | Discharge status: Home / Self Care | Payer: Medicare Other | Source: Ambulatory Visit | Attending: Radiation Oncology | Admitting: Radiation Oncology

## 2021-04-17 DIAGNOSIS — C3431 Malignant neoplasm of lower lobe, right bronchus or lung: Secondary | ICD-10-CM | POA: Diagnosis not present

## 2021-04-17 DIAGNOSIS — Z510 Encounter for antineoplastic radiation therapy: Secondary | ICD-10-CM | POA: Diagnosis not present

## 2021-04-17 NOTE — Progress Notes (Signed)
Oncology Nurse Navigator Documentation  Oncology Nurse Navigator Flowsheets 04/17/2021 04/15/2021 01/24/2021 01/18/2021 01/16/2021  Abnormal Finding Date - - - - 11/05/2020  Confirmed Diagnosis Date - - - - 12/10/2020  Diagnosis Status - - - - Confirmed Diagnosis Complete  Planned Course of Treatment - Chemo/Radiation Concurrent - Neo Chemo;Targeted Therapy Neo Chemo;Targeted Therapy  Phase of Treatment - Radiation - Targeted Therapy Targeted Therapy  Chemotherapy Actual Start Date: - - - 01/24/2021 -  Radiation Actual Start Date: - 03/27/2021 - - -  Targeted Therapy Actual Start Date: - - - 01/24/2021 -  Navigator Follow Up Date: - 04/29/2021 02/06/2021 01/21/2021 01/18/2021  Navigator Follow Up Reason: - Follow-up Appointment Follow-up Appointment Education Appointment Review  Navigator Location [facility]  Navigator Encounter Type [facility]/MDC Other: Telephone Clinic/MDC  Telephone - - - Education;Outgoing Call;Asess Navigation Needs -  Treatment Initiated Date - - - 01/24/2021 -  Patient Visit Type - Follow-up;MedOnc Other Other MedOnc  Treatment Phase Treatment/I saw Brandon Rubio yesterday leaving the cancer center after his tx.  He is doing well and had no complaints.   Treatment Treatment Pre-Tx/Tx Discussion Pre-Tx/Tx Discussion  Barriers/Navigation Needs - Education Coordination of Care Coordination of Care;Education Education  Education - Other - Other Newly Diagnosed Cancer Education;Other  Interventions Psycho-Social Support Psycho-Social Support;Education Coordination of Care Coordination of Care;Education;Psycho-Social Support Education;None Required  Acuity - Level 2-Minimal Needs (1-2 Barriers Identified) Level 2-Minimal Needs (1-2 Barriers Identified) Level 2-Minimal Needs (1-2 Barriers Identified) Level 2-Minimal Needs (1-2 Barriers Identified)  Coordination of Care - - Pathology - -  Education Method -  Verbal - Verbal Verbal;Written  Time Spent with Patient 15 30 30 15  30

## 2021-04-18 ENCOUNTER — Ambulatory Visit: Payer: Medicare Other | Admitting: Internal Medicine

## 2021-04-18 ENCOUNTER — Ambulatory Visit: Payer: Medicare Other

## 2021-04-18 ENCOUNTER — Ambulatory Visit
Admission: RE | Admit: 2021-04-18 | Discharge: 2021-04-18 | Disposition: A | Discharge status: Home / Self Care | Payer: Medicare Other | Source: Ambulatory Visit | Attending: Radiation Oncology | Admitting: Radiation Oncology

## 2021-04-18 ENCOUNTER — Other Ambulatory Visit: Payer: Medicare Other

## 2021-04-18 DIAGNOSIS — Z510 Encounter for antineoplastic radiation therapy: Secondary | ICD-10-CM | POA: Diagnosis not present

## 2021-04-18 DIAGNOSIS — C3431 Malignant neoplasm of lower lobe, right bronchus or lung: Secondary | ICD-10-CM | POA: Diagnosis not present

## 2021-04-19 ENCOUNTER — Other Ambulatory Visit: Payer: Self-pay

## 2021-04-19 ENCOUNTER — Ambulatory Visit
Admission: RE | Admit: 2021-04-19 | Discharge: 2021-04-19 | Disposition: A | Discharge status: Home / Self Care | Payer: Medicare Other | Source: Ambulatory Visit | Attending: Radiation Oncology | Admitting: Radiation Oncology

## 2021-04-19 DIAGNOSIS — C3431 Malignant neoplasm of lower lobe, right bronchus or lung: Secondary | ICD-10-CM | POA: Diagnosis not present

## 2021-04-19 DIAGNOSIS — Z510 Encounter for antineoplastic radiation therapy: Secondary | ICD-10-CM | POA: Diagnosis not present

## 2021-04-19 MED FILL — Dexamethasone Sodium Phosphate Inj 100 MG/10ML: INTRAMUSCULAR | Qty: 1 | Status: AC

## 2021-04-22 ENCOUNTER — Ambulatory Visit
Admission: RE | Admit: 2021-04-22 | Discharge: 2021-04-22 | Disposition: A | Discharge status: Home / Self Care | Payer: Medicare Other | Source: Ambulatory Visit | Attending: Radiation Oncology | Admitting: Radiation Oncology

## 2021-04-22 ENCOUNTER — Inpatient Hospital Stay: Payer: Medicare Other

## 2021-04-22 ENCOUNTER — Other Ambulatory Visit: Payer: Self-pay

## 2021-04-22 VITALS — BP 146/75 | HR 88 | Temp 97.7°F | Resp 18 | Wt 209.5 lb

## 2021-04-22 DIAGNOSIS — Z510 Encounter for antineoplastic radiation therapy: Secondary | ICD-10-CM | POA: Diagnosis not present

## 2021-04-22 DIAGNOSIS — C3491 Malignant neoplasm of unspecified part of right bronchus or lung: Secondary | ICD-10-CM

## 2021-04-22 DIAGNOSIS — C3431 Malignant neoplasm of lower lobe, right bronchus or lung: Secondary | ICD-10-CM | POA: Diagnosis not present

## 2021-04-22 LAB — CBC WITH DIFFERENTIAL (CANCER CENTER ONLY)
Abs Immature Granulocytes: 0.02 10*3/uL (ref 0.00–0.07)
Basophils Absolute: 0 10*3/uL (ref 0.0–0.1)
Basophils Relative: 1 %
Eosinophils Absolute: 0 10*3/uL (ref 0.0–0.5)
Eosinophils Relative: 1 %
HCT: 37.4 % — ABNORMAL LOW (ref 39.0–52.0)
Hemoglobin: 12.6 g/dL — ABNORMAL LOW (ref 13.0–17.0)
Immature Granulocytes: 1 %
Lymphocytes Relative: 21 %
Lymphs Abs: 0.9 10*3/uL (ref 0.7–4.0)
MCH: 32 pg (ref 26.0–34.0)
MCHC: 33.7 g/dL (ref 30.0–36.0)
MCV: 94.9 fL (ref 80.0–100.0)
Monocytes Absolute: 0.4 10*3/uL (ref 0.1–1.0)
Monocytes Relative: 10 %
Neutro Abs: 2.8 10*3/uL (ref 1.7–7.7)
Neutrophils Relative %: 66 %
Platelet Count: 121 10*3/uL — ABNORMAL LOW (ref 150–400)
RBC: 3.94 MIL/uL — ABNORMAL LOW (ref 4.22–5.81)
RDW: 16.2 % — ABNORMAL HIGH (ref 11.5–15.5)
WBC Count: 4.3 10*3/uL (ref 4.0–10.5)
nRBC: 0 % (ref 0.0–0.2)

## 2021-04-22 LAB — CMP (CANCER CENTER ONLY)
ALT: 13 U/L (ref 0–44)
AST: 12 U/L — ABNORMAL LOW (ref 15–41)
Albumin: 4.3 g/dL (ref 3.5–5.0)
Alkaline Phosphatase: 49 U/L (ref 38–126)
Anion gap: 10 (ref 5–15)
BUN: 20 mg/dL (ref 8–23)
CO2: 24 mmol/L (ref 22–32)
Calcium: 9.3 mg/dL (ref 8.9–10.3)
Chloride: 104 mmol/L (ref 98–111)
Creatinine: 0.86 mg/dL (ref 0.61–1.24)
GFR, Estimated: >60 mL/min (ref >60)
Glucose, Bld: 120 mg/dL — ABNORMAL HIGH (ref 70–99)
Potassium: 4 mmol/L (ref 3.5–5.1)
Sodium: 138 mmol/L (ref 135–145)
Total Bilirubin: 0.5 mg/dL (ref 0.3–1.2)
Total Protein: 7.2 g/dL (ref 6.5–8.1)

## 2021-04-22 MED ORDER — PALONOSETRON HCL INJECTION 0.25 MG/5ML
0.2500 mg | Freq: Once | INTRAVENOUS | Status: AC
  Administered 2021-04-22: 0.25 mg via INTRAVENOUS
  Filled 2021-04-22: qty 5

## 2021-04-22 MED ORDER — SODIUM CHLORIDE 0.9 % IV SOLN
45.0000 mg/m2 | Freq: Once | INTRAVENOUS | Status: AC
  Administered 2021-04-22: 96 mg via INTRAVENOUS
  Filled 2021-04-22: qty 16

## 2021-04-22 MED ORDER — HEPARIN SOD (PORK) LOCK FLUSH 100 UNIT/ML IV SOLN: 500.0000 [IU] | Freq: Once | INTRAVENOUS | Status: DC | PRN

## 2021-04-22 MED ORDER — FAMOTIDINE IN NACL 20-0.9 MG/50ML-% IV SOLN
20.0000 mg | Freq: Once | INTRAVENOUS | Status: AC
  Administered 2021-04-22: 20 mg via INTRAVENOUS
  Filled 2021-04-22: qty 50

## 2021-04-22 MED ORDER — SODIUM CHLORIDE 0.9 % IV SOLN: Freq: Once | INTRAVENOUS | Status: AC

## 2021-04-22 MED ORDER — DIPHENHYDRAMINE HCL 50 MG/ML IJ SOLN
50.0000 mg | Freq: Once | INTRAMUSCULAR | Status: AC
  Administered 2021-04-22: 50 mg via INTRAVENOUS
  Filled 2021-04-22: qty 1

## 2021-04-22 MED ORDER — SODIUM CHLORIDE 0.9% FLUSH: 10.0000 mL | INTRAVENOUS | Status: DC | PRN

## 2021-04-22 MED ORDER — SODIUM CHLORIDE 0.9 % IV SOLN
225.6000 mg | Freq: Once | INTRAVENOUS | Status: AC
  Administered 2021-04-22: 230 mg via INTRAVENOUS
  Filled 2021-04-22: qty 23

## 2021-04-22 MED ORDER — SODIUM CHLORIDE 0.9 % IV SOLN
10.0000 mg | Freq: Once | INTRAVENOUS | Status: AC
  Administered 2021-04-22: 10 mg via INTRAVENOUS
  Filled 2021-04-22: qty 10

## 2021-04-23 ENCOUNTER — Ambulatory Visit
Admission: RE | Admit: 2021-04-23 | Discharge: 2021-04-23 | Disposition: A | Discharge status: Home / Self Care | Payer: Medicare Other | Source: Ambulatory Visit | Attending: Radiation Oncology | Admitting: Radiation Oncology

## 2021-04-23 DIAGNOSIS — Z510 Encounter for antineoplastic radiation therapy: Secondary | ICD-10-CM | POA: Diagnosis not present

## 2021-04-23 DIAGNOSIS — C3431 Malignant neoplasm of lower lobe, right bronchus or lung: Secondary | ICD-10-CM | POA: Diagnosis not present

## 2021-04-24 ENCOUNTER — Ambulatory Visit
Admission: RE | Admit: 2021-04-24 | Discharge: 2021-04-24 | Disposition: A | Discharge status: Home / Self Care | Payer: Medicare Other | Source: Ambulatory Visit | Attending: Radiation Oncology | Admitting: Radiation Oncology

## 2021-04-24 DIAGNOSIS — C3431 Malignant neoplasm of lower lobe, right bronchus or lung: Secondary | ICD-10-CM | POA: Diagnosis not present

## 2021-04-24 DIAGNOSIS — Z510 Encounter for antineoplastic radiation therapy: Secondary | ICD-10-CM | POA: Diagnosis not present

## 2021-04-25 ENCOUNTER — Ambulatory Visit
Admission: RE | Admit: 2021-04-25 | Discharge: 2021-04-25 | Disposition: A | Discharge status: Home / Self Care | Payer: Medicare Other | Source: Ambulatory Visit | Attending: Radiation Oncology | Admitting: Radiation Oncology

## 2021-04-25 ENCOUNTER — Other Ambulatory Visit: Payer: Medicare Other

## 2021-04-25 ENCOUNTER — Other Ambulatory Visit: Payer: Self-pay

## 2021-04-25 DIAGNOSIS — C3431 Malignant neoplasm of lower lobe, right bronchus or lung: Secondary | ICD-10-CM | POA: Diagnosis not present

## 2021-04-25 DIAGNOSIS — Z510 Encounter for antineoplastic radiation therapy: Secondary | ICD-10-CM | POA: Diagnosis not present

## 2021-04-26 ENCOUNTER — Ambulatory Visit
Admission: RE | Admit: 2021-04-26 | Discharge: 2021-04-26 | Disposition: A | Discharge status: Home / Self Care | Payer: Medicare Other | Source: Ambulatory Visit | Attending: Radiation Oncology | Admitting: Radiation Oncology

## 2021-04-26 DIAGNOSIS — C3431 Malignant neoplasm of lower lobe, right bronchus or lung: Secondary | ICD-10-CM | POA: Diagnosis not present

## 2021-04-26 DIAGNOSIS — Z510 Encounter for antineoplastic radiation therapy: Secondary | ICD-10-CM | POA: Diagnosis not present

## 2021-04-26 MED FILL — Dexamethasone Sodium Phosphate Inj 100 MG/10ML: INTRAMUSCULAR | Qty: 1 | Status: AC

## 2021-04-29 ENCOUNTER — Inpatient Hospital Stay: Payer: Medicare Other

## 2021-04-29 ENCOUNTER — Encounter: Payer: Self-pay | Admitting: Internal Medicine

## 2021-04-29 ENCOUNTER — Other Ambulatory Visit: Payer: Self-pay

## 2021-04-29 ENCOUNTER — Ambulatory Visit
Admission: RE | Admit: 2021-04-29 | Discharge: 2021-04-29 | Disposition: A | Discharge status: Home / Self Care | Payer: Medicare Other | Source: Ambulatory Visit | Attending: Radiation Oncology | Admitting: Radiation Oncology

## 2021-04-29 ENCOUNTER — Inpatient Hospital Stay (HOSPITAL_BASED_OUTPATIENT_CLINIC_OR_DEPARTMENT_OTHER): Payer: Medicare Other | Admitting: Internal Medicine

## 2021-04-29 VITALS — BP 123/76 | HR 101 | Temp 97.2°F | Resp 18 | Ht 72.0 in | Wt 207.4 lb

## 2021-04-29 DIAGNOSIS — C3431 Malignant neoplasm of lower lobe, right bronchus or lung: Secondary | ICD-10-CM | POA: Diagnosis not present

## 2021-04-29 DIAGNOSIS — C3491 Malignant neoplasm of unspecified part of right bronchus or lung: Secondary | ICD-10-CM

## 2021-04-29 DIAGNOSIS — Z5112 Encounter for antineoplastic immunotherapy: Secondary | ICD-10-CM

## 2021-04-29 DIAGNOSIS — Z510 Encounter for antineoplastic radiation therapy: Secondary | ICD-10-CM | POA: Diagnosis not present

## 2021-04-29 LAB — CBC WITH DIFFERENTIAL (CANCER CENTER ONLY)
Abs Immature Granulocytes: 0.01 K/uL (ref 0.00–0.07)
Basophils Absolute: 0 K/uL (ref 0.0–0.1)
Basophils Relative: 1 %
Eosinophils Absolute: 0 K/uL (ref 0.0–0.5)
Eosinophils Relative: 1 %
HCT: 35.6 % — ABNORMAL LOW (ref 39.0–52.0)
Hemoglobin: 12.3 g/dL — ABNORMAL LOW (ref 13.0–17.0)
Immature Granulocytes: 0 %
Lymphocytes Relative: 27 %
Lymphs Abs: 1 K/uL (ref 0.7–4.0)
MCH: 31.9 pg (ref 26.0–34.0)
MCHC: 34.6 g/dL (ref 30.0–36.0)
MCV: 92.5 fL (ref 80.0–100.0)
Monocytes Absolute: 0.3 K/uL (ref 0.1–1.0)
Monocytes Relative: 8 %
Neutro Abs: 2.4 K/uL (ref 1.7–7.7)
Neutrophils Relative %: 63 %
Platelet Count: 68 K/uL — ABNORMAL LOW (ref 150–400)
RBC: 3.85 MIL/uL — ABNORMAL LOW (ref 4.22–5.81)
RDW: 15.7 % — ABNORMAL HIGH (ref 11.5–15.5)
WBC Count: 3.9 K/uL — ABNORMAL LOW (ref 4.0–10.5)
nRBC: 0 % (ref 0.0–0.2)

## 2021-04-29 LAB — CMP (CANCER CENTER ONLY)
ALT: 15 U/L (ref 0–44)
AST: 12 U/L — ABNORMAL LOW (ref 15–41)
Albumin: 4 g/dL (ref 3.5–5.0)
Alkaline Phosphatase: 50 U/L (ref 38–126)
Anion gap: 9 (ref 5–15)
BUN: 16 mg/dL (ref 8–23)
CO2: 25 mmol/L (ref 22–32)
Calcium: 8.8 mg/dL — ABNORMAL LOW (ref 8.9–10.3)
Chloride: 103 mmol/L (ref 98–111)
Creatinine: 0.9 mg/dL (ref 0.61–1.24)
GFR, Estimated: >60 mL/min
Glucose, Bld: 117 mg/dL — ABNORMAL HIGH (ref 70–99)
Potassium: 3.8 mmol/L (ref 3.5–5.1)
Sodium: 137 mmol/L (ref 135–145)
Total Bilirubin: 0.5 mg/dL (ref 0.3–1.2)
Total Protein: 7.1 g/dL (ref 6.5–8.1)

## 2021-04-29 NOTE — Progress Notes (Signed)
[facility] Telephone:([phone]   Fax:(336) [phone]  OFFICE PROGRESS NOTE  [name], MD [place] [address]  DIAGNOSIS: Stage IIIA (T4, N0, M0) non-small cell lung cancer, favoring adenocarcinoma presented with large right lower lobe lung mass with no mediastinal lymphadenopathy or extrathoracic metastasis diagnosed in October 2022. Had disease progression with enlarging mass and new right hilar and mediastinal lymph nodes in December 2022.      PRIOR THERAPY: Neoadjuvant treatment with carboplatin for AUC of 5, Alimta 500 Mg/M2 and nivolumab 360 Mg IV every 3 weeks.  First dose January 23, 2021.  Status post 3 cycles.   CURRENT THERAPY: Concurrent chemoradiation with carboplatin for an AUC of 2 and paclitaxel 45 mg per metered squared.  First dose expected on 04/01/2021.  Status post 4 cycles  INTERVAL HISTORY: Brandon Rubio. 71 y.o. male returns to the clinic today for follow-up visit.  The patient is feeling fine today with no concerning complaints.  He continues to tolerate his concurrent chemoradiation fairly well.  He denied having any chest pain, shortness of breath, cough or hemoptysis.  He denied having any fever or chills.  He has no nausea, vomiting, diarrhea or constipation.  He has no headache or visual changes.  He is here today for evaluation before starting cycle #5 of his treatment.   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  MEDICATIONS:  Current Outpatient Medications  Medication  Sig Dispense Refill   aspirin EC 81 MG tablet Take 81 mg by mouth in the morning. Swallow whole.     blood glucose meter kit and supplies KIT Dispense based on patient and insurance preference. Use up to four times daily as directed. 1 each 0   empagliflozin (JARDIANCE) 25 MG TABS tablet Take 1 tablet (25 mg total) by mouth daily. 90 tablet 3   folic acid (FOLVITE) 1 MG tablet Take 1 tablet (1 mg total) by mouth daily. 30 tablet 4   glucose blood (FREESTYLE LITE) test strip USE TO TEST BLOOD SUGAR TWICE DAILY ICD E11.49 100 each 3   HYDROcodone-acetaminophen (NORCO) 5-325 MG tablet Take 1 tablet by mouth every 6 (six) hours as needed for moderate pain. 30 tablet 0   insulin glargine (LANTUS) 100 UNIT/ML Solostar Pen Inject 15 Units into the skin daily. 4.5 mL 1   Melatonin 5 MG CHEW Chew 5 mg by mouth at bedtime.     metFORMIN (GLUCOPHAGE) 1000 MG tablet TAKE 1 TABLET BY MOUTH TWICE DAILY WITH A MEAL 180 tablet 1   metoprolol succinate (TOPROL-XL) 50 MG 24 hr tablet Take 1 tablet (50 mg total) by mouth daily. Take with or immediately following a meal. 90 tablet 1   Multiple Vitamin (MULTIVITAMIN WITH MINERALS) TABS tablet Take 1 tablet by mouth daily. Centrum Silver     nicotine (NICODERM CQ - DOSED IN MG/24 HOURS) 21 mg/24hr patch Place 1 patch (21 mg total) onto the skin  daily. (Patient not taking: Reported on 03/26/2021) 28 patch 0   omeprazole (PRILOSEC) 20 MG capsule TAKE 1 CAPSULE(20 MG) BY MOUTH TWICE DAILY BEFORE A MEAL 180 capsule 3   prochlorperazine (COMPAZINE) 10 MG tablet Take 1 tablet (10 mg total) by mouth every 6 (six) hours as needed for nausea or vomiting. 30 tablet 0   simvastatin (ZOCOR) 40 MG tablet Take 1 tablet (40 mg total) by mouth daily at 6 PM. 90 tablet 3   traZODone (DESYREL) 50 MG tablet Take 1-2 tablets (50-100 mg total) by mouth at bedtime as needed for sleep. (Patient taking differently: Take 50-100 mg by mouth at bedtime as needed for sleep. Pt states taking two  tablets) 180 tablet 0   triamcinolone cream (KENALOG) 0.1 % Apply 1 application topically 2 (two) times daily as needed (poison ivy).     No current facility-administered medications for this visit.    SURGICAL HISTORY:  Past Surgical History:  Procedure Laterality Date   BRONCHIAL BIOPSY  12/10/2020   Procedure: BRONCHIAL BIOPSIES;  Surgeon: Collene Gobble, MD;  Location: [facility] ENDOSCOPY;  Service: Pulmonary;;   BRONCHIAL BRUSHINGS  12/10/2020   Procedure: BRONCHIAL BRUSHINGS;  Surgeon: Collene Gobble, MD;  Location: [facility] ENDOSCOPY;  Service: Pulmonary;;   BRONCHIAL NEEDLE ASPIRATION BIOPSY  12/10/2020   Procedure: BRONCHIAL NEEDLE ASPIRATION BIOPSIES;  Surgeon: Collene Gobble, MD;  Location: [facility] ENDOSCOPY;  Service: Pulmonary;;   COLONOSCOPY     COLONOSCOPY W/ POLYPECTOMY  03/10/2009   LARYNGOSCOPY  03/10/1997   VIDEO BRONCHOSCOPY WITH ENDOBRONCHIAL NAVIGATION N/A 12/10/2020   Procedure: ROBOTIC [place];  Surgeon: Collene Gobble, MD;  Location: [facility] ENDOSCOPY;  Service: Pulmonary;  Laterality: N/A;    REVIEW OF SYSTEMS:  A comprehensive review of systems was negative except for: Constitutional: positive for fatigue   PHYSICAL EXAMINATION: General appearance: alert, cooperative, and no distress Head: Normocephalic, without obvious abnormality, atraumatic Neck: no adenopathy, no JVD, supple, symmetrical, trachea midline, and thyroid not enlarged, symmetric, no tenderness/mass/nodules Lymph nodes: Cervical, supraclavicular, and axillary nodes normal. Resp: clear to auscultation bilaterally Back: symmetric, no curvature. ROM normal. No CVA tenderness. Cardio: regular rate and rhythm, S1, S2 normal, no murmur, click, rub or gallop GI: soft, non-tender; bowel sounds normal; no masses,  no organomegaly Extremities: extremities normal, atraumatic, no cyanosis or edema  ECOG PERFORMANCE STATUS: 1 - Symptomatic but completely ambulatory  Blood pressure  123/76, pulse (!) 101, temperature (!) 97.2 F (36.2 C), temperature source Tympanic, resp. rate 18, height 6' (1.829 m), weight 207 lb 6.4 oz (94.1 kg), SpO2 98 %.  LABORATORY DATA: Lab Results  Component Value Date   WBC 3.9 (L) 04/29/2021   HGB 12.3 (L) 04/29/2021   HCT 35.6 (L) 04/29/2021   MCV 92.5 04/29/2021   PLT 68 (L) 04/29/2021      Chemistry      Component Value Date/Time   NA 138 04/22/2021 1052   K 4.0 04/22/2021 1052   CL 104 04/22/2021 1052   CO2 24 04/22/2021 1052   BUN 20 04/22/2021 1052   CREATININE 0.86 04/22/2021 1052      Component Value Date/Time   CALCIUM 9.3 04/22/2021 1052   ALKPHOS 49 04/22/2021 1052   AST 12 (L) 04/22/2021 1052   ALT 13 04/22/2021 1052   BILITOT 0.5 04/22/2021 1052       RADIOGRAPHIC STUDIES: No results found.  ASSESSMENT AND PLAN: This is a very pleasant 71 years old white male with  history of a stage IIIA (T4, N1, M0) non-small cell lung cancer, adenocarcinoma presented with right lower lobe lung mass with a right hilar and no mediastinal lymphadenopathy and no evidence of extrathoracic disease.  The patient was diagnosed in October 2022 status post 3 cycles of neoadjuvant systemic chemotherapy with carboplatin, Alimta and nivolumab but unfortunately has no evidence for significant improvement of his disease and actually has some evidence for progression. The patient is currently undergoing a course of concurrent chemoradiation weekly carboplatin for AUC of 2 and paclitaxel 45 Mg/M2.  Status post 4 cycles. The patient continues to tolerate his treatment fairly well with no concerning adverse effects. His CBC today showed low platelets count of 68,000. I recommended for the patient to hold his chemotherapy today. He will resume his treatment next week as planned if the platelets count improved. I will see the patient back for follow-up visit in 2 weeks for evaluation before starting cycle #6. He was advised to call immediately if  he has any concerning symptoms in the interval. The patient voices understanding of current disease status and treatment options and is in agreement with the current care plan.  All questions were answered. The patient knows to call the clinic with any problems, questions or concerns. We can certainly see the patient much sooner if necessary.  The total time spent in the appointment was 20 minutes.  Disclaimer: This note was dictated with voice recognition software. Similar sounding words can inadvertently be transcribed and may not be corrected upon review.

## 2021-04-30 ENCOUNTER — Ambulatory Visit
Admission: RE | Admit: 2021-04-30 | Discharge: 2021-04-30 | Disposition: A | Discharge status: Home / Self Care | Payer: Medicare Other | Source: Ambulatory Visit | Attending: Radiation Oncology | Admitting: Radiation Oncology

## 2021-04-30 DIAGNOSIS — C3431 Malignant neoplasm of lower lobe, right bronchus or lung: Secondary | ICD-10-CM | POA: Diagnosis not present

## 2021-04-30 DIAGNOSIS — Z510 Encounter for antineoplastic radiation therapy: Secondary | ICD-10-CM | POA: Diagnosis not present

## 2021-05-01 ENCOUNTER — Other Ambulatory Visit: Payer: Self-pay

## 2021-05-01 ENCOUNTER — Ambulatory Visit
Admission: RE | Admit: 2021-05-01 | Discharge: 2021-05-01 | Disposition: A | Discharge status: Home / Self Care | Payer: Medicare Other | Source: Ambulatory Visit | Attending: Radiation Oncology | Admitting: Radiation Oncology

## 2021-05-01 DIAGNOSIS — C3431 Malignant neoplasm of lower lobe, right bronchus or lung: Secondary | ICD-10-CM | POA: Diagnosis not present

## 2021-05-01 DIAGNOSIS — Z510 Encounter for antineoplastic radiation therapy: Secondary | ICD-10-CM | POA: Diagnosis not present

## 2021-05-02 ENCOUNTER — Inpatient Hospital Stay: Payer: Medicare Other

## 2021-05-02 ENCOUNTER — Encounter: Payer: Self-pay | Admitting: Physician Assistant

## 2021-05-02 ENCOUNTER — Inpatient Hospital Stay (HOSPITAL_BASED_OUTPATIENT_CLINIC_OR_DEPARTMENT_OTHER): Payer: Medicare Other | Admitting: Physician Assistant

## 2021-05-02 ENCOUNTER — Ambulatory Visit
Admission: RE | Admit: 2021-05-02 | Discharge: 2021-05-02 | Disposition: A | Discharge status: Home / Self Care | Payer: Medicare Other | Source: Ambulatory Visit | Attending: Radiation Oncology | Admitting: Radiation Oncology

## 2021-05-02 ENCOUNTER — Other Ambulatory Visit: Payer: Medicare Other

## 2021-05-02 VITALS — BP 118/85 | HR 115 | Temp 97.5°F

## 2021-05-02 DIAGNOSIS — E860 Dehydration: Secondary | ICD-10-CM

## 2021-05-02 DIAGNOSIS — C3431 Malignant neoplasm of lower lobe, right bronchus or lung: Secondary | ICD-10-CM | POA: Diagnosis not present

## 2021-05-02 DIAGNOSIS — R42 Dizziness and giddiness: Secondary | ICD-10-CM | POA: Diagnosis not present

## 2021-05-02 DIAGNOSIS — Z510 Encounter for antineoplastic radiation therapy: Secondary | ICD-10-CM | POA: Diagnosis not present

## 2021-05-02 DIAGNOSIS — C3491 Malignant neoplasm of unspecified part of right bronchus or lung: Secondary | ICD-10-CM

## 2021-05-02 LAB — GLUCOSE, CAPILLARY: Glucose-Capillary: 117 mg/dL — ABNORMAL HIGH (ref 70–99)

## 2021-05-02 MED ORDER — SODIUM CHLORIDE 0.9 % IV SOLN: INTRAVENOUS | Status: DC

## 2021-05-02 NOTE — Progress Notes (Signed)
[name]  [name], MD [place] [address]  DIAGNOSIS: Stage IIIA (T4, N0, M0) non-small cell lung cancer, favoring adenocarcinoma presented with large right lower lobe lung mass with no mediastinal lymphadenopathy or extrathoracic metastasis diagnosed in October 2022. Had disease progression with enlarging mass and new right hilar and mediastinal lymph nodes in December 2022.   PRIOR THERAPY: Neoadjuvant treatment with carboplatin for AUC of 5, Alimta 500 Mg/M2 and nivolumab 360 Mg IV every 3 weeks.  First dose January 23, 2021.  Status post 2 cycles.  CURRENT THERAPY: Concurrent chemoradiation with carboplatin for an AUC of 2 and paclitaxel 45 mg per metered squared.  First dose expected on 04/01/2021. Status post 4 cycles.   INTERVAL HISTORY: Brandon Rubio. 71 y.o. male returns to the clinic today for symptom management clinic appointment.  The patient was here for radiation today and reported feeling lightheaded and dizzy in the lobby.  The patient states that since Monday, he has felt like he may pass out.  The patient did not get chemotherapy this week due to thrombocytopenia with a platelet count of 68k.  His last treatment was on 04/22/2021.   The patient states that he felt this way a few weeks ago and received IV fluids which improved his symptoms.  He denies any significant odynophagia or dysphagia from his radiation treatment; however, he does feel like he has been drinking less fluid this week.  He reports dry mouth.  He reports a good appetite.  He denies any loss of consciousness.  He denies any extremity weakness, numbness and tingling, or speech changes.  Denies any history of arrhythmia.  Denies any changes with his breathing besides his baseline dyspnea on exertion.  Denies cough but reports phlegm production which is clear.  Denies any hemoptysis.  Denies any bleeding or bruising.  Denies any chest pain  or palpitations.  The patient has diabetes and took his blood sugar this morning which was 110.  He has not eaten or drinking yet today.  He is here for evaluation regarding his symptoms.    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  MEDICATIONS:  Current Outpatient Medications  Medication Sig Dispense Refill   aspirin EC 81 MG tablet Take 81 mg by mouth in the morning. Swallow whole.     blood glucose meter kit and supplies KIT Dispense based on patient and insurance preference. Use up to four times daily as directed. 1 each 0   empagliflozin (JARDIANCE) 25 MG TABS tablet Take 1 tablet (25 mg total) by mouth daily. 90 tablet 3   folic acid (FOLVITE) 1 MG tablet Take 1 tablet (1 mg total) by mouth daily. 30 tablet 4   glucose blood (FREESTYLE LITE) test strip USE TO TEST BLOOD SUGAR TWICE DAILY ICD E11.49 100 each 3   HYDROcodone-acetaminophen (NORCO) 5-325 MG tablet Take 1 tablet by mouth every 6 (six) hours as needed for moderate  pain. (Patient not taking: Reported on 04/29/2021) 30 tablet 0   insulin glargine (LANTUS) 100 UNIT/ML Solostar Pen Inject 15 Units into the skin daily. 4.5 mL 1   Melatonin 5 MG CHEW Chew 5 mg by mouth at bedtime.     metFORMIN (GLUCOPHAGE) 1000 MG tablet TAKE 1 TABLET BY MOUTH TWICE DAILY WITH A MEAL 180 tablet 1   metoprolol succinate (TOPROL-XL) 50 MG 24 hr tablet Take 1 tablet (50 mg total) by mouth daily. Take with or immediately following a meal. 90 tablet 1   Multiple Vitamin (MULTIVITAMIN WITH MINERALS) TABS tablet Take 1 tablet by  mouth daily. Centrum Silver     nicotine (NICODERM CQ - DOSED IN MG/24 HOURS) 21 mg/24hr patch Place 1 patch (21 mg total) onto the skin daily. (Patient not taking: Reported on 03/26/2021) 28 patch 0   omeprazole (PRILOSEC) 20 MG capsule TAKE 1 CAPSULE(20 MG) BY MOUTH TWICE DAILY BEFORE A MEAL 180 capsule 3   prochlorperazine (COMPAZINE) 10 MG tablet Take 1 tablet (10 mg total) by mouth every 6 (six) hours as needed for nausea or vomiting. 30 tablet 0   simvastatin (ZOCOR) 40 MG tablet Take 1 tablet (40 mg total) by mouth daily at 6 PM. 90 tablet 3   traZODone (DESYREL) 50 MG tablet Take 1-2 tablets (50-100 mg total) by mouth at bedtime as needed for sleep. (Patient taking differently: Take 50-100 mg by mouth at bedtime as needed for sleep. Pt states taking two tablets) 180 tablet 0   triamcinolone cream (KENALOG) 0.1 % Apply 1 application topically 2 (two) times daily as needed (poison ivy).     No current facility-administered medications for this visit.   Facility-Administered Medications Ordered in Other Visits  Medication Dose Route Frequency Provider Last Rate Last Admin   0.9 %  sodium chloride infusion   Intravenous Continuous Chaddrick Brue L, PA-C        SURGICAL HISTORY:  Past Surgical History:  Procedure Laterality Date   BRONCHIAL BIOPSY  12/10/2020   Procedure: BRONCHIAL BIOPSIES;  Surgeon: Collene Gobble, MD;  Location: [facility] ENDOSCOPY;  Service: Pulmonary;;   BRONCHIAL BRUSHINGS  12/10/2020   Procedure: BRONCHIAL BRUSHINGS;  Surgeon: Collene Gobble, MD;  Location: [facility] ENDOSCOPY;  Service: Pulmonary;;   BRONCHIAL NEEDLE ASPIRATION BIOPSY  12/10/2020   Procedure: BRONCHIAL NEEDLE ASPIRATION BIOPSIES;  Surgeon: Collene Gobble, MD;  Location: [facility] ENDOSCOPY;  Service: Pulmonary;;   COLONOSCOPY     COLONOSCOPY W/ POLYPECTOMY  03/10/2009   LARYNGOSCOPY  03/10/1997   VIDEO BRONCHOSCOPY WITH ENDOBRONCHIAL NAVIGATION N/A 12/10/2020   Procedure: ROBOTIC Farrell;  Surgeon: Collene Gobble, MD;  Location: [facility] ENDOSCOPY;  Service: Pulmonary;  Laterality: N/A;    REVIEW OF SYSTEMS:   Review of Systems  Constitutional: Positive for fatigue and lightheadedness.  Positive for decreased p.o. fluid intake.  Negative for appetite change, chills, fever and unexpected weight change.  HENT: Negative for mouth sores, nosebleeds, sore throat and trouble swallowing.   Eyes: Negative for eye problems and icterus.  Respiratory: Positive for baseline dyspnea on exertion.  Positive for phlegm production /clear.  Negative for cough, hemoptysis, and wheezing.   Cardiovascular: Negative for chest pain and leg swelling.  Gastrointestinal: Negative for abdominal pain, constipation, diarrhea, nausea and vomiting.  Genitourinary: Negative for bladder incontinence, difficulty urinating, dysuria, frequency and hematuria.   Musculoskeletal: Negative for back pain, gait problem, neck pain and neck stiffness.  Skin: Negative for itching and rash.  Neurological: Positive for lightheadedness and dizziness.  Negative for extremity weakness, gait problem, headaches,  and seizures.  Hematological: Negative for adenopathy. Does not bruise/bleed easily.  Psychiatric/Behavioral: Negative for confusion, depression and sleep disturbance. The patient is not nervous/anxious.     PHYSICAL EXAMINATION:  Blood pressure 118/85, pulse (!) 115, temperature (!) 97.5 F (36.4 C), temperature source Oral, SpO2 96 %.  ECOG PERFORMANCE STATUS: 1  Physical Exam  Constitutional: Oriented to person, place, and time and well-developed, well-nourished, and in no distress. No distress.  HENT:  Head: Normocephalic and atraumatic.  Mouth/Throat: Oropharynx is clear and dry mucous membranes.  No oropharyngeal exudate.  Eyes: Conjunctivae are normal. Right eye exhibits no discharge. Left eye exhibits no discharge. No scleral icterus.  Neck: Normal range of motion. Neck supple.   Cardiovascular: Tachycardic, regular rhythm, normal heart sounds and intact distal pulses.   Pulmonary/Chest: Effort normal and breath sounds normal. No respiratory distress. No wheezes. No rales.  Abdominal: Soft. Bowel sounds are normal. Exhibits no distension and no mass. There is no tenderness.  Musculoskeletal: Normal range of motion. Exhibits no edema.  Lymphadenopathy:    No cervical adenopathy.  Neurological: Alert and oriented to person, place, and time. Exhibits normal muscle tone. Gait normal. Coordination normal.  Skin: Skin is warm and dry. No rash noted. Not diaphoretic. No erythema. No pallor.  Psychiatric: Mood, memory and judgment normal.  Vitals reviewed.  LABORATORY DATA: Lab Results  Component Value Date   WBC 3.9 (L) 04/29/2021   HGB 12.3 (L) 04/29/2021   HCT 35.6 (L) 04/29/2021   MCV 92.5 04/29/2021   PLT 68 (L) 04/29/2021      Chemistry      Component Value Date/Time   NA 137 04/29/2021 0831   K 3.8 04/29/2021 0831   CL 103 04/29/2021 0831   CO2 25 04/29/2021 0831   BUN 16 04/29/2021 0831   CREATININE 0.90 04/29/2021 0831      Component Value Date/Time   CALCIUM 8.8 (L) 04/29/2021 0831   ALKPHOS 50 04/29/2021 0831   AST 12 (L) 04/29/2021 0831   ALT 15 04/29/2021 0831   BILITOT 0.5 04/29/2021 0831       RADIOGRAPHIC STUDIES:  No results found.   ASSESSMENT/PLAN:  This is a very pleasant 71 year old Caucasian male diagnosed with stage IIIa (T4, N1, M0) non-small cell lung cancer, favor to be adenocarcinoma.  He initially presented with a right lower lobe lung mass with no mediastinal lymphadenopathy or extrathoracic metastases.  He was diagnosed in October 2022.  He showed evidence of disease progression after 2 cycles of neoadjuvant treatment with enlarging right lower lobe lung mass and new enlarging right hilar mediastinal lymph nodes.   The patient underwent 2 cycles of neoadjuvant systemic chemotherapy with carboplatin for an AUC of 5,  Alimta 500 mg per metered squared and nivolumab 360 mg IV every 3 weeks.  He status post 4 cycles.  He did not get treatment this week due to thrombocytopenia with a platelet count of 68 K.  Denies any abnormal bleeding.  The patient reports decreased fluid intake this week.  Denies any significant odynophagia or dysphagia from radiation.  He reports a normal appetite.  Denies any palpitations, chest pain, or changes with his baseline dyspnea on exertion.  He states he feels similar to when he had dehydration previously and felt better with IV fluids.  Denies any facial drooping, speech changes, numbness and tingling, or extremity weakness. Overall, he is well appearing today.  We will arrange for him to receive 1 L of IV fluids over 2 hours today.  Additionally, the patient has diabetes and did not eat breakfast.  We will ensure that he eats and drinks while in the infusion room today and will check his blood sugar to ensure no hypoglycemia.  No significant anemia on labs or hypotension today.  If no improvement with his tachycardia with IV fluids, we will arrange for the patient to have an EKG performed in the infusion room today.  The patient was advised to call us if he develops any new or worsening symptoms in the interval.  The patient was advised to call immediately if he has any concerning symptoms in the interval. The patient voices understanding of current disease status and treatment options and is in agreement with the current care plan. All questions were answered. The patient knows to call the clinic with any problems, questions or concerns. We can certainly see the patient much sooner if necessary           Orders Placed This Encounter  Procedures   EKG 12-Lead     The total time spent in the appointment was 30-39 minutes.   [place] Boyett L Santhosh Gulino, PA-C 05/02/21

## 2021-05-03 ENCOUNTER — Other Ambulatory Visit: Payer: Self-pay

## 2021-05-03 ENCOUNTER — Ambulatory Visit
Admission: RE | Admit: 2021-05-03 | Discharge: 2021-05-03 | Disposition: A | Discharge status: Home / Self Care | Payer: Medicare Other | Source: Ambulatory Visit | Attending: Radiation Oncology | Admitting: Radiation Oncology

## 2021-05-03 DIAGNOSIS — Z510 Encounter for antineoplastic radiation therapy: Secondary | ICD-10-CM | POA: Diagnosis not present

## 2021-05-03 DIAGNOSIS — C3431 Malignant neoplasm of lower lobe, right bronchus or lung: Secondary | ICD-10-CM | POA: Diagnosis not present

## 2021-05-03 MED FILL — Dexamethasone Sodium Phosphate Inj 100 MG/10ML: INTRAMUSCULAR | Qty: 1 | Status: AC

## 2021-05-06 ENCOUNTER — Emergency Department (HOSPITAL_COMMUNITY): Payer: Medicare Other

## 2021-05-06 ENCOUNTER — Inpatient Hospital Stay: Payer: Medicare Other

## 2021-05-06 ENCOUNTER — Other Ambulatory Visit: Payer: Self-pay

## 2021-05-06 ENCOUNTER — Ambulatory Visit
Admission: RE | Admit: 2021-05-06 | Discharge: 2021-05-06 | Disposition: A | Discharge status: Home / Self Care | Payer: Medicare Other | Source: Ambulatory Visit | Attending: Radiation Oncology | Admitting: Radiation Oncology

## 2021-05-06 ENCOUNTER — Encounter (HOSPITAL_COMMUNITY): Payer: Self-pay

## 2021-05-06 ENCOUNTER — Emergency Department (HOSPITAL_COMMUNITY)
Admission: EM | Admit: 2021-05-06 | Discharge: 2021-05-06 | Disposition: A | Discharge status: Home / Self Care | Payer: Medicare Other | Source: Home / Self Care | Attending: Emergency Medicine | Admitting: Emergency Medicine

## 2021-05-06 DIAGNOSIS — R531 Weakness: Secondary | ICD-10-CM | POA: Diagnosis not present

## 2021-05-06 DIAGNOSIS — C3431 Malignant neoplasm of lower lobe, right bronchus or lung: Secondary | ICD-10-CM | POA: Diagnosis not present

## 2021-05-06 DIAGNOSIS — Z794 Long term (current) use of insulin: Secondary | ICD-10-CM | POA: Insufficient documentation

## 2021-05-06 DIAGNOSIS — I951 Orthostatic hypotension: Secondary | ICD-10-CM | POA: Diagnosis not present

## 2021-05-06 DIAGNOSIS — J9811 Atelectasis: Secondary | ICD-10-CM | POA: Diagnosis not present

## 2021-05-06 DIAGNOSIS — Z20822 Contact with and (suspected) exposure to covid-19: Secondary | ICD-10-CM | POA: Insufficient documentation

## 2021-05-06 DIAGNOSIS — R630 Anorexia: Secondary | ICD-10-CM | POA: Insufficient documentation

## 2021-05-06 DIAGNOSIS — Z7982 Long term (current) use of aspirin: Secondary | ICD-10-CM | POA: Insufficient documentation

## 2021-05-06 DIAGNOSIS — C3491 Malignant neoplasm of unspecified part of right bronchus or lung: Secondary | ICD-10-CM

## 2021-05-06 DIAGNOSIS — E860 Dehydration: Secondary | ICD-10-CM | POA: Insufficient documentation

## 2021-05-06 DIAGNOSIS — Z7984 Long term (current) use of oral hypoglycemic drugs: Secondary | ICD-10-CM | POA: Insufficient documentation

## 2021-05-06 DIAGNOSIS — Z510 Encounter for antineoplastic radiation therapy: Secondary | ICD-10-CM | POA: Diagnosis not present

## 2021-05-06 LAB — CBC WITH DIFFERENTIAL (CANCER CENTER ONLY)
Abs Immature Granulocytes: 0.01 10*3/uL (ref 0.00–0.07)
Basophils Absolute: 0 10*3/uL (ref 0.0–0.1)
Basophils Relative: 1 %
Eosinophils Absolute: 0.1 10*3/uL (ref 0.0–0.5)
Eosinophils Relative: 3 %
HCT: 36.2 % — ABNORMAL LOW (ref 39.0–52.0)
Hemoglobin: 12.5 g/dL — ABNORMAL LOW (ref 13.0–17.0)
Immature Granulocytes: 0 %
Lymphocytes Relative: 22 %
Lymphs Abs: 0.8 10*3/uL (ref 0.7–4.0)
MCH: 32 pg (ref 26.0–34.0)
MCHC: 34.5 g/dL (ref 30.0–36.0)
MCV: 92.6 fL (ref 80.0–100.0)
Monocytes Absolute: 0.3 10*3/uL (ref 0.1–1.0)
Monocytes Relative: 10 %
Neutro Abs: 2.2 10*3/uL (ref 1.7–7.7)
Neutrophils Relative %: 64 %
Platelet Count: 62 10*3/uL — ABNORMAL LOW (ref 150–400)
RBC: 3.91 MIL/uL — ABNORMAL LOW (ref 4.22–5.81)
RDW: 15.3 % (ref 11.5–15.5)
WBC Count: 3.5 10*3/uL — ABNORMAL LOW (ref 4.0–10.5)
nRBC: 0 % (ref 0.0–0.2)

## 2021-05-06 LAB — CMP (CANCER CENTER ONLY)
ALT: 12 U/L (ref 0–44)
AST: 17 U/L (ref 15–41)
Albumin: 4.1 g/dL (ref 3.5–5.0)
Alkaline Phosphatase: 55 U/L (ref 38–126)
Anion gap: 14 (ref 5–15)
BUN: 15 mg/dL (ref 8–23)
CO2: 19 mmol/L — ABNORMAL LOW (ref 22–32)
Calcium: 9.4 mg/dL (ref 8.9–10.3)
Chloride: 102 mmol/L (ref 98–111)
Creatinine: 0.94 mg/dL (ref 0.61–1.24)
GFR, Estimated: >60 mL/min (ref >60)
Glucose, Bld: 120 mg/dL — ABNORMAL HIGH (ref 70–99)
Potassium: 4.1 mmol/L (ref 3.5–5.1)
Sodium: 135 mmol/L (ref 135–145)
Total Bilirubin: 0.7 mg/dL (ref 0.3–1.2)
Total Protein: 7.7 g/dL (ref 6.5–8.1)

## 2021-05-06 LAB — RESP PANEL BY RT-PCR (FLU A&B, COVID) ARPGX2
Influenza A by PCR: NEGATIVE
Influenza B by PCR: NEGATIVE
SARS Coronavirus 2 by RT PCR: NEGATIVE

## 2021-05-06 IMAGING — DX DG CHEST 1V PORT
1 series · 1 of 1 positions shown · non-contrast
Comparison: [DATE]

CLINICAL DATA: Weak

EXAM:
PORTABLE CHEST 1 VIEW

[chest ap]
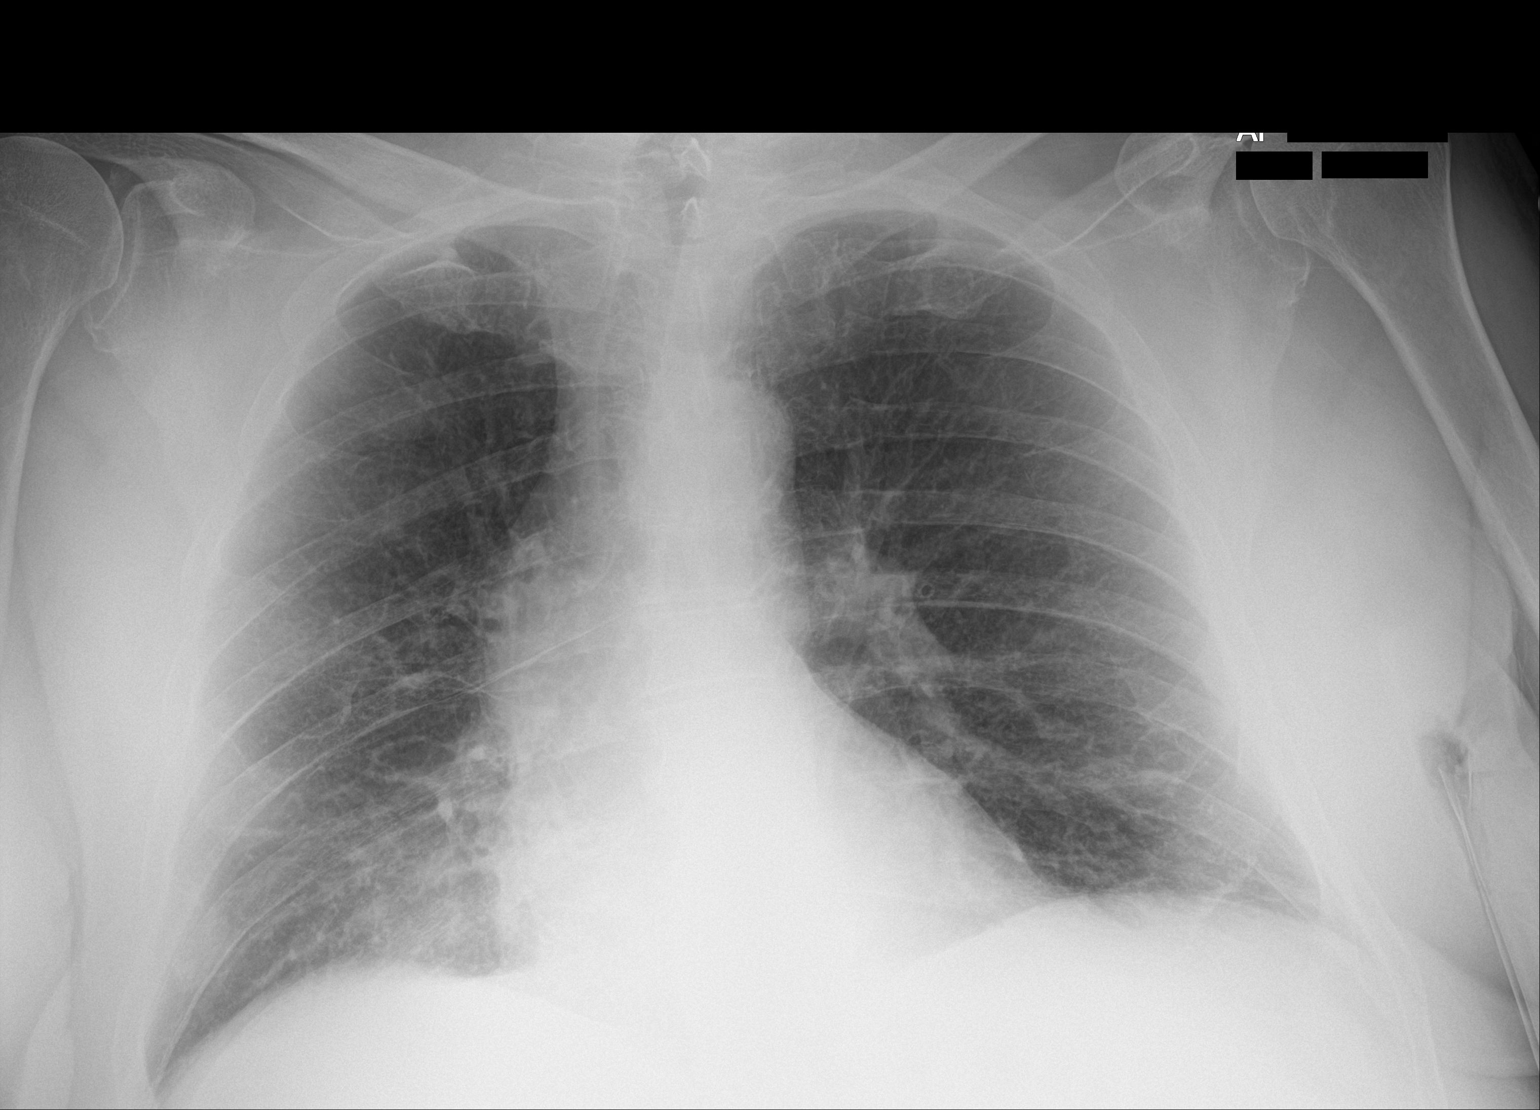

[1 of 1 positions shown; findings below may reference images not displayed]

FINDINGS: Increased density at the right lung base. Patchy atelectasis left
lung base. No pleural effusion or pneumothorax. Similar
cardiomediastinal contours.
IMPRESSION: Increased density at the right lung base likely reflecting known
mass and radiation changes. Mild atelectasis left lung base.

## 2021-05-06 MED ORDER — ONDANSETRON HCL 4 MG/2ML IJ SOLN
4.0000 mg | Freq: Once | INTRAMUSCULAR | Status: AC
  Administered 2021-05-06: 4 mg via INTRAVENOUS
  Filled 2021-05-06: qty 2

## 2021-05-06 MED ORDER — SODIUM CHLORIDE 0.9 % IV BOLUS
1000.0000 mL | Freq: Once | INTRAVENOUS | Status: AC
  Administered 2021-05-06: 1000 mL via INTRAVENOUS

## 2021-05-06 NOTE — Progress Notes (Signed)
Pt states after his fluids last week he has been steadily feeling worse. He has a log of his blood sugars starting th-today. Today it was 4 and he did not take his diabetes medications. He has had SOB that he states it getting worse, a little cough, feeling very weak and dizzy as if he is going to faint. He has not had an appetite for 2 days and is feeling sick on his stomach. He said he spent Fri, sat, and sun in bed. He also reports that he has not been able to think and he has to really focus on the words he wants to say. Dr Julien Nordmann aware and no treatment today, instructed pt to proceed to ED for further workup. Pt aware and agrees, ambulatory to ED.

## 2021-05-06 NOTE — ED Triage Notes (Signed)
Pt c/o dizziness, loss of appetite and nausea since Thursday of last. Seen for same recently. CA Pt, states he gets radiation everyday for last 22 days and Chemo every Monday.

## 2021-05-06 NOTE — Discharge Instructions (Signed)
I would hold your metoprolol for for the next 5 days or until your next follow-up with oncology and primary care for blood pressure check.  Encourage you to drink protein shakes and increase your oral intake especially of fluids which will help with your symptoms.  Discontinuing your metoprolol will help prevent your blood pressure from getting too low.  Please take your time when you change positions especially when going from sitting to standing.  Take your time with walking and after you stand up.  Consider holding onto something when you stand up as well.

## 2021-05-06 NOTE — ED Provider Notes (Addendum)
[facility] DEPT Provider Note   CSN: [id number] Arrival date & time: 05/06/21  1208     History  Chief Complaint  Patient presents with   Weakness   Dizziness   Anorexia    Brandon Rubio. is a 71 y.o. male.  Patient with fairly new diagnosis of lung cancer on chemotherapy and radiation.  He has been dealing with some poor appetite, intermittent dizziness especially when standing.  Had IV fluids recently that really helped with the symptoms.  Had radiation today and lab work drawn.  Chemotherapy has been held recently due to low platelets.  He denies any bleeding, headache, weakness, numbness.  No chest pain or shortness of breath.  He was able to eat some ice cream this morning while getting his radiation.  He states that he just does not have the urge to eat or drink much.  Denies any abdominal pain.  No vision changes, no speech changes.  The history is provided by the patient.  Weakness Severity:  Mild Onset quality:  Gradual Duration:  1 week Timing:  Intermittent Progression:  Waxing and waning Chronicity:  New Context: dehydration   Relieved by:  Nothing Worsened by:  Standing Associated symptoms: dizziness   Associated symptoms: no abdominal pain, no anorexia, no aphasia, no arthralgias, no ataxia, no chest pain, no cough, no diarrhea, no difficulty walking, no drooling, no dysphagia, no dysuria, no numbness in extremities, no falls, no fever, no foul-smelling urine, no frequency, no headaches, no hematochezia, no lethargy, no loss of consciousness, no melena, no myalgias, no nausea, no near-syncope, no seizures, no sensory-motor deficit, no shortness of breath, no stroke symptoms, no syncope, no urgency, no vision change and no vomiting       Home Medications Prior to Admission medications   Medication Sig Start Date End Date Taking? Authorizing Provider  aspirin EC 81 MG tablet Take 81 mg by mouth in the morning. Swallow whole.     [provider]  blood glucose meter kit and supplies KIT Dispense based on patient and insurance preference. Use up to four times daily as directed. 03/09/21   Antonieta Pert, MD  empagliflozin (JARDIANCE) 25 MG TABS tablet Take 1 tablet (25 mg total) by mouth daily. 03/19/21   [name], MD  folic acid (FOLVITE) 1 MG tablet Take 1 tablet (1 mg total) by mouth daily. 01/16/21   Curt Bears, MD  glucose blood (FREESTYLE LITE) test strip USE TO TEST BLOOD SUGAR TWICE DAILY ICD E11.49 01/18/14   Hendricks Limes, MD  HYDROcodone-acetaminophen (NORCO) 5-325 MG tablet Take 1 tablet by mouth every 6 (six) hours as needed for moderate pain. Patient not taking: Reported on 04/29/2021 03/18/21   Heilingoetter, [name], PA-C  insulin glargine (LANTUS) 100 UNIT/ML Solostar Pen Inject 15 Units into the skin daily. 03/09/21 05/08/21  Antonieta Pert, MD  Melatonin 5 MG CHEW Chew 5 mg by mouth at bedtime.    [provider]  metFORMIN (GLUCOPHAGE) 1000 MG tablet TAKE 1 TABLET BY MOUTH TWICE DAILY WITH A MEAL 09/14/20   [name], MD  metoprolol succinate (TOPROL-XL) 50 MG 24 hr tablet Take 1 tablet (50 mg total) by mouth daily. Take with or immediately following a meal. 01/18/21   [name], MD  Multiple Vitamin (MULTIVITAMIN WITH MINERALS) TABS tablet Take 1 tablet by mouth daily. Centrum Silver    [provider]  nicotine (NICODERM CQ - DOSED IN MG/24 HOURS) 21 mg/24hr patch  Place 1 patch (21 mg total) onto the skin daily. Patient not taking: Reported on 03/26/2021 12/28/20   [name], MD  omeprazole (PRILOSEC) 20 MG capsule TAKE 1 CAPSULE(20 MG) BY MOUTH TWICE DAILY BEFORE A MEAL 10/02/20   [name], MD  prochlorperazine (COMPAZINE) 10 MG tablet Take 1 tablet (10 mg total) by mouth every 6 (six) hours as needed for nausea or vomiting. 02/21/21   Curt Bears, MD  simvastatin (ZOCOR) 40 MG tablet Take 1 tablet (40 mg total) by  mouth daily at 6 PM. 06/26/20   [name], MD  traZODone (DESYREL) 50 MG tablet Take 1-2 tablets (50-100 mg total) by mouth at bedtime as needed for sleep. Patient taking differently: Take 50-100 mg by mouth at bedtime as needed for sleep. Pt states taking two tablets 12/28/20   [name], MD  triamcinolone cream (KENALOG) 0.1 % Apply 1 application topically 2 (two) times daily as needed (poison ivy). 12/10/20   Collene Gobble, MD      Allergies    Penicillins    Review of Systems   Review of Systems  Constitutional:  Negative for fever.  HENT:  Negative for drooling.   Respiratory:  Negative for cough and shortness of breath.   Cardiovascular:  Negative for chest pain, syncope and near-syncope.  Gastrointestinal:  Negative for abdominal pain, anorexia, diarrhea, dysphagia, hematochezia, melena, nausea and vomiting.  Genitourinary:  Negative for dysuria, frequency and urgency.  Musculoskeletal:  Negative for arthralgias, falls and myalgias.  Neurological:  Positive for dizziness and weakness. Negative for seizures, loss of consciousness and headaches.   Physical Exam Updated Vital Signs BP 103/69    Pulse 90    Temp 97.9 F (36.6 C) (Oral)    Resp (!) 22    SpO2 92%  Physical Exam Vitals and nursing note reviewed.  Constitutional:      General: He is not in acute distress.    Appearance: He is well-developed. He is not ill-appearing.  HENT:     Head: Normocephalic and atraumatic.  Eyes:     Extraocular Movements: Extraocular movements intact.     Conjunctiva/sclera: Conjunctivae normal.     Pupils: Pupils are equal, round, and reactive to light.  Cardiovascular:     Rate and Rhythm: Normal rate and regular rhythm.     Pulses: Normal pulses.     Heart sounds: Normal heart sounds. No murmur heard. Pulmonary:     Effort: Pulmonary effort is normal. No respiratory distress.     Breath sounds: Normal breath sounds.  Abdominal:     Palpations: Abdomen is  soft.     Tenderness: There is no abdominal tenderness.  Musculoskeletal:        General: No swelling.     Cervical back: Normal range of motion and neck supple.  Skin:    General: Skin is warm and dry.     Capillary Refill: Capillary refill takes less than 2 seconds.  Neurological:     General: No focal deficit present.     Mental Status: He is alert and oriented to person, place, and time.     Cranial Nerves: No cranial nerve deficit.     Sensory: No sensory deficit.     Motor: No weakness.     Coordination: Coordination normal.     Comments: 5+ out of 5 strength throughout, normal sensation, no drift, normal finger-nose-finger, normal speech  Psychiatric:        Mood and  Affect: Mood normal.    ED Results / Procedures / Treatments   Labs (all labs ordered are listed, but only abnormal results are displayed) Labs Reviewed  RESP PANEL BY RT-PCR (FLU A&B, COVID) [id number]    EKG EKG Interpretation  Date/Time:  Monday May 06 2021 13:26:13 EST Ventricular Rate:  94 PR Interval:  118 QRS Duration: 87 QT Interval:  331 QTC Calculation: 414 R Axis:   30 Text Interpretation: Sinus rhythm Borderline short PR interval Confirmed by Lennice Sites ([id number]) on 05/06/2021 1:36:08 PM  Radiology DG Chest Portable 1 View  Result Date: 05/06/2021 CLINICAL DATA:  Weak EXAM: PORTABLE CHEST 1 VIEW COMPARISON:  December 2022 FINDINGS: Increased density at the right lung base. Patchy atelectasis left lung base. No pleural effusion or pneumothorax. Similar cardiomediastinal contours. IMPRESSION: Increased density at the right lung base likely reflecting known mass and radiation changes. Mild atelectasis left lung base. Electronically Signed   By: Macy Mis M.D.   On: 05/06/2021 13:31    Procedures Procedures    Medications Ordered in ED Medications  sodium chloride 0.9 % bolus 1,000 mL (1,000 mLs Intravenous New Bag/Given 05/06/21 1236)  ondansetron (ZOFRAN) injection 4 mg (4 mg  Intravenous Given 05/06/21 1236)    ED Course/ Medical Decision Making/ A&P                           Medical Decision Making Amount and/or Complexity of Data Reviewed Radiology: ordered.  Risk Prescription drug management.   Brandon Rubio. is here with generalized weakness, poor appetite.  Currently undergoing treatment for lung cancer with chemotherapy and radiation.  History of CAD, hypertension.  Patient with unremarkable vitals.  Orthostatics are positive.  When patient lying down systolic blood pressure is 134, when patient stands up it is 96 and becomes symptomatic.  Does get mildly tachycardic as well.  He has not had much appetite and poor p.o. intake over the last several weeks since starting new chemotherapy cycle.  He is had low platelets and did not have chemotherapy last week.  He had radiation therapy today.  He had a CBC and CMP drawn.  He had a screening this morning while at radiation.  He has no symptoms at rest but feels dizzy and lightheaded when he stands up.  Suspect poor p.o. intake is causing him to be orthostatic.  He does take metoprolol but no other medications for blood pressure.  He denies any abdominal pain, chest pain, shortness of breath.  Denies any infectious symptoms.  Differential diagnosis likely orthostasis from poor p.o. intake, possible electrolyte abnormality, kidney injury, pneumonia.  We will check COVID test, chest x-ray.  Per my review of CBC and CMP from today there is no significant anemia, electrolyte abnormality, kidney injury, leukocytosis.  Will give IV fluid bolus and anticipate discharge.  He states he has had the symptoms recently and improved with IV fluids.  Per my review of chest x-ray interpretation no obvious pneumonia.  Radiology report also states likely reflecting known mass/radiation.  COVID test and flu test are negative.  Repeat orthostatics are improved.  Patient states symptoms are improved.  Able to stand up without any  dizziness.  Discharged in good condition.  We will have patient hold metoprolol and follow-up with primary care doctor.  This chart was dictated using voice recognition software.  Despite best efforts to proofread,  errors can occur which can change the documentation meaning.  Final Clinical Impression(s) / ED Diagnoses Final diagnoses:  Dehydration  Orthostasis    Rx / DC Orders ED Discharge Orders     None         Lennice Sites, DO 05/06/21 [place], [place], DO 05/06/21 1354

## 2021-05-07 ENCOUNTER — Inpatient Hospital Stay: Payer: Medicare Other

## 2021-05-07 ENCOUNTER — Encounter: Payer: Self-pay | Admitting: *Deleted

## 2021-05-07 ENCOUNTER — Ambulatory Visit
Admission: RE | Admit: 2021-05-07 | Discharge: 2021-05-07 | Disposition: A | Discharge status: Home / Self Care | Payer: Medicare Other | Source: Ambulatory Visit | Attending: Radiation Oncology | Admitting: Radiation Oncology

## 2021-05-07 ENCOUNTER — Inpatient Hospital Stay (HOSPITAL_BASED_OUTPATIENT_CLINIC_OR_DEPARTMENT_OTHER): Payer: Medicare Other | Admitting: Internal Medicine

## 2021-05-07 VITALS — BP 126/81 | HR 113 | Temp 97.9°F | Resp 18 | Ht 72.0 in | Wt 202.7 lb

## 2021-05-07 DIAGNOSIS — C3431 Malignant neoplasm of lower lobe, right bronchus or lung: Secondary | ICD-10-CM | POA: Diagnosis not present

## 2021-05-07 DIAGNOSIS — E860 Dehydration: Secondary | ICD-10-CM

## 2021-05-07 DIAGNOSIS — C3491 Malignant neoplasm of unspecified part of right bronchus or lung: Secondary | ICD-10-CM

## 2021-05-07 DIAGNOSIS — Z5111 Encounter for antineoplastic chemotherapy: Secondary | ICD-10-CM

## 2021-05-07 DIAGNOSIS — Z510 Encounter for antineoplastic radiation therapy: Secondary | ICD-10-CM | POA: Diagnosis not present

## 2021-05-07 MED ORDER — SODIUM CHLORIDE 0.9 % IV SOLN: INTRAVENOUS | Status: AC

## 2021-05-07 NOTE — Patient Instructions (Signed)

## 2021-05-07 NOTE — Progress Notes (Signed)
Brandon Rubio [phone]   [phone]  OFFICE PROGRESS NOTE  [name], MD [place] [address]  DIAGNOSIS: Stage IIIA (T4, N0, M0) non-small cell lung cancer, favoring adenocarcinoma presented with large right lower lobe lung mass with no mediastinal lymphadenopathy or extrathoracic metastasis diagnosed in October 2022. Had disease progression with enlarging mass and new right hilar and mediastinal lymph nodes in December 2022.      PRIOR THERAPY: Neoadjuvant treatment with carboplatin for AUC of 5, Alimta 500 Mg/M2 and nivolumab 360 Mg IV every 3 weeks.  First dose January 23, 2021.  Status post 3 cycles.   CURRENT THERAPY: Concurrent chemoradiation with carboplatin for an AUC of 2 and paclitaxel 45 mg per metered squared.  First dose expected on 04/01/2021.  Status post 4 cycles  INTERVAL HISTORY: Brandon Rubio. 71 y.o. male returns to the clinic today for follow-up visit.  The patient continues to complain of increasing fatigue and weakness as well as dizzy spells.  He does not eat and drink enough.  He presented to the emergency department yesterday complaining of the dizzy spells as well as tachycardia.  He received IV hydration and was sent home.  He felt a little bit better last night but this morning he started having dizzy spells again.  He denied having any current chest pain, shortness of breath, cough or hemoptysis.  He has no nausea, vomiting, diarrhea or constipation.  He was tolerating his treatment fairly well except for the fatigue and dehydration.   MEDICAL HISTORY: Past Medical History:  Diagnosis Date   CAD (coronary artery disease)    Cancer ([place])    SKIN.[name]   Cataract    Diabetes mellitus    2   DVT (deep venous thrombosis) ([place]) 03/30/2001   RLE DVT, post ankle fracture   GERD (gastroesophageal reflux disease)    Hyperlipidemia    Hypertension    Lung cancer (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  MEDICATIONS:  Current Outpatient Medications  Medication Sig Dispense Refill   aspirin EC 81 MG tablet Take 81 mg by mouth in the morning. Swallow whole.     blood glucose meter kit and supplies KIT Dispense based on patient and insurance preference. Use up to four times daily as directed. 1 each 0   empagliflozin (JARDIANCE) 25 MG TABS tablet Take 1 tablet (25 mg total) by mouth daily. 90 tablet 3   folic acid (FOLVITE) 1 MG tablet Take 1 tablet (1 mg total) by mouth daily. 30 tablet 4   glucose blood (FREESTYLE LITE) test strip USE TO TEST BLOOD SUGAR TWICE DAILY ICD E11.49 100 each 3   HYDROcodone-acetaminophen (NORCO) 5-325 MG tablet Take 1 tablet by mouth every 6 (six) hours as needed for moderate pain. (Patient not taking: Reported on 04/29/2021) 30 tablet 0   insulin glargine (LANTUS) 100 UNIT/ML Solostar Pen Inject 15 Units into the skin daily. 4.5 mL 1   Melatonin 5 MG CHEW Chew 5 mg by mouth at bedtime.     metFORMIN (GLUCOPHAGE) 1000 MG tablet TAKE 1 TABLET BY MOUTH TWICE DAILY WITH A MEAL 180 tablet 1   metoprolol succinate (TOPROL-XL) 50 MG 24 hr tablet Take 1 tablet (50 mg total) by mouth daily. Take with or immediately following a meal. 90 tablet 1   Multiple  Vitamin (MULTIVITAMIN WITH MINERALS) TABS tablet Take 1 tablet by mouth daily. Centrum Silver     nicotine (NICODERM CQ - DOSED IN MG/24 HOURS) 21 mg/24hr patch Place 1 patch (21 mg total) onto the skin daily. (Patient not taking: Reported on 03/26/2021) 28 patch 0   omeprazole (PRILOSEC) 20 MG capsule TAKE 1 CAPSULE(20 MG) BY MOUTH TWICE DAILY BEFORE A MEAL 180 capsule 3   prochlorperazine (COMPAZINE) 10 MG tablet Take 1 tablet (10 mg total) by mouth every 6 (six) hours as needed for nausea or vomiting. 30 tablet 0   simvastatin (ZOCOR) 40 MG tablet Take 1 tablet (40 mg total) by mouth daily at 6 PM.  90 tablet 3   traZODone (DESYREL) 50 MG tablet Take 1-2 tablets (50-100 mg total) by mouth at bedtime as needed for sleep. (Patient taking differently: Take 50-100 mg by mouth at bedtime as needed for sleep. Pt states taking two tablets) 180 tablet 0   triamcinolone cream (KENALOG) 0.1 % Apply 1 application topically 2 (two) times daily as needed (poison ivy).     No current facility-administered medications for this visit.    SURGICAL HISTORY:  Past Surgical History:  Procedure Laterality Date   BRONCHIAL BIOPSY  12/10/2020   Procedure: BRONCHIAL BIOPSIES;  Surgeon: Collene Gobble, MD;  Location[facility] ENDOSCOPY;  Service: Pulmonary;;   BRONCHIAL BRUSHINGS  12/10/2020   Procedure: BRONCHIAL BRUSHINGS;  Surgeon: Collene Gobble, MD;  Location: [facility] ENDOSCOPY;  Service: Pulmonary;;   BRONCHIAL NEEDLE ASPIRATION BIOPSY  12/10/2020   Procedure: BRONCHIAL NEEDLE ASPIRATION BIOPSIES;  Surgeon: Collene Gobble, MD;  Location: [facility] ENDOSCOPY;  Service: Pulmonary;;   COLONOSCOPY     COLONOSCOPY W/ POLYPECTOMY  03/10/2009   LARYNGOSCOPY  03/10/1997   VIDEO BRONCHOSCOPY WITH ENDOBRONCHIAL NAVIGATION N/A 12/10/2020   Procedure: ROBOTIC [place];  Surgeon: Collene Gobble, MD;  Location: [facility] ENDOSCOPY;  Service: Pulmonary;  Laterality: N/A;    REVIEW OF SYSTEMS:  A comprehensive review of systems was negative except for: Constitutional: positive for anorexia, fatigue, and weight loss Neurological: positive for dizziness   PHYSICAL EXAMINATION: General appearance: alert, cooperative, fatigued, and no distress Head: Normocephalic, without obvious abnormality, atraumatic Neck: no adenopathy, no JVD, supple, symmetrical, trachea midline, and thyroid not enlarged, symmetric, no tenderness/mass/nodules Lymph nodes: Cervical, supraclavicular, and axillary nodes normal. Resp: clear to auscultation bilaterally Back: symmetric, no curvature. ROM normal. No CVA  tenderness. Cardio: regular rate and rhythm, S1, S2 normal, no murmur, click, rub or gallop GI: soft, non-tender; bowel sounds normal; no masses,  no organomegaly Extremities: extremities normal, atraumatic, no cyanosis or edema  ECOG PERFORMANCE STATUS: 1 - Symptomatic but completely ambulatory  Blood pressure 126/81, pulse (!) 113, temperature 97.9 F (36.6 C), temperature source Tympanic, resp. rate 18, height 6' (1.829 m), weight 202 lb 11.2 oz (91.9 kg), SpO2 97 %.  LABORATORY DATA: Lab Results  Component Value Date   WBC 3.5 (L) 05/06/2021   HGB 12.5 (L) 05/06/2021   HCT 36.2 (L) 05/06/2021   MCV 92.6 05/06/2021   PLT 62 (L) 05/06/2021      Chemistry      Component Value Date/Time   NA 135 05/06/2021 1055   K 4.1 05/06/2021 1055   CL 102 05/06/2021 1055   CO2 19 (L) 05/06/2021 1055   BUN 15 05/06/2021 1055   CREATININE 0.94 05/06/2021 1055      Component Value Date/Time   CALCIUM 9.4 05/06/2021 1055   ALKPHOS 55  05/06/2021 1055   AST 17 05/06/2021 1055   ALT 12 05/06/2021 1055   BILITOT 0.7 05/06/2021 1055       RADIOGRAPHIC STUDIES: DG Chest Portable 1 View  Result Date: 05/06/2021 CLINICAL DATA:  Weak EXAM: PORTABLE CHEST 1 VIEW COMPARISON:  December 2022 FINDINGS: Increased density at the right lung base. Patchy atelectasis left lung base. No pleural effusion or pneumothorax. Similar cardiomediastinal contours. IMPRESSION: Increased density at the right lung base likely reflecting known mass and radiation changes. Mild atelectasis left lung base. Electronically Signed   By: Macy Mis M.D.   On: 05/06/2021 13:31    ASSESSMENT AND PLAN: This is a very pleasant 71 years old white male with history of a stage IIIA (T4, N1, M0) non-small cell lung cancer, adenocarcinoma presented with right lower lobe lung mass with a right hilar and no mediastinal lymphadenopathy and no evidence of extrathoracic disease.  The patient was diagnosed in October 2022 status post 3  cycles of neoadjuvant systemic chemotherapy with carboplatin, Alimta and nivolumab but unfortunately has no evidence for significant improvement of his disease and actually has some evidence for progression. The patient is currently undergoing a course of concurrent chemoradiation weekly carboplatin for AUC of 2 and paclitaxel 45 Mg/M2.  Status post 4 cycles. He has been tolerating this treatment well except recently when he started having more fatigue and weakness as well as dehydration and dizzy spells.  He also has thrombocytopenia secondary to the chemotherapy and radiation. I recommended for the patient to receive IV hydration with normal saline in the clinic today. We will continue to delay his chemotherapy until improvement of his platelets count but he is expected to complete the course of radiotherapy on May 15, 2021. I will see the patient back for follow-up visit next week for evaluation and recommendation regarding his condition and also considering repeating imaging studies at that time. He was advised to call immediately if he has any other concerning symptoms in the interval. The patient voices understanding of current disease status and treatment options and is in agreement with the current care plan.  All questions were answered. The patient knows to call the clinic with any problems, questions or concerns. We can certainly see the patient much sooner if necessary.  The total time spent in the appointment was 20 minutes.  Disclaimer: This note was dictated with voice recognition software. Similar sounding words can inadvertently be transcribed and may not be corrected upon review.

## 2021-05-07 NOTE — Progress Notes (Signed)
Oncology Nurse Navigator Documentation  Oncology Nurse Navigator Flowsheets 05/07/2021 04/17/2021 04/15/2021 01/24/2021 01/18/2021 01/16/2021  Abnormal Finding Date - - - - - 11/05/2020  Confirmed Diagnosis Date - - - - - 12/10/2020  Diagnosis Status - - - - - Confirmed Diagnosis Complete  Planned Course of Treatment - - Chemo/Radiation Concurrent - Neo Chemo;Targeted Therapy Neo Chemo;Targeted Therapy  Phase of Treatment - - Radiation - Targeted Therapy Targeted Therapy  Chemotherapy Actual Start Date: - - - - 01/24/2021 -  Radiation Actual Start Date: - - 03/27/2021 - - -  Targeted Therapy Actual Start Date: - - - - 01/24/2021 -  Navigator Follow Up Date: 05/13/2021 - 04/29/2021 02/06/2021 01/21/2021 01/18/2021  Navigator Follow Up Reason: Follow-up Appointment - Follow-up Appointment Follow-up Appointment Education Appointment Review  Navigator Location [facility]  Navigator Encounter Type Clinic/MDC;Follow-up Appt Lobby Clinic/MDC Other: Telephone Clinic/MDC  Telephone - - - - Education;Outgoing Call;Asess Navigation Needs -  Treatment Initiated Date - - - - 01/24/2021 -  Patient Visit Type Follow-up;MedOnc - Follow-up;MedOnc Other Other MedOnc  Treatment Phase Treatment/I spoke with Brandon Rubio today at his appt.  He is not feeling well today and Dr. Julien Nordmann addressed his issues.  He will be getting fluids today.  I listened to him explain his frustration in not feeling well.  I offered support and encouragement.  I asked if I could refer him to nutrition. He declined at this time.  Treatment Treatment Treatment Pre-Tx/Tx Discussion Pre-Tx/Tx Discussion  Barriers/Navigation Needs Education - Education Coordination of Care Coordination of Care;Education Education  Education Other - Other - Other Newly Diagnosed Cancer Education;Other  Interventions Psycho-Social Support;Education Psycho-Social Support  Psycho-Social Support;Education Coordination of Care Coordination of Care;Education;Psycho-Social Support Education;None Required  Acuity Level 2-Minimal Needs (1-2 Barriers Identified) - Level 2-Minimal Needs (1-2 Barriers Identified) Level 2-Minimal Needs (1-2 Barriers Identified) Level 2-Minimal Needs (1-2 Barriers Identified) Level 2-Minimal Needs (1-2 Barriers Identified)  Coordination of Care - - - Pathology - -  Education Method Verbal - Verbal - Verbal Verbal;Written  Time Spent with Patient 30 15 30 30 15  30

## 2021-05-08 ENCOUNTER — Telehealth: Payer: Self-pay | Admitting: Radiation Oncology

## 2021-05-08 ENCOUNTER — Ambulatory Visit: Payer: Medicare Other

## 2021-05-08 DIAGNOSIS — Z5111 Encounter for antineoplastic chemotherapy: Secondary | ICD-10-CM | POA: Insufficient documentation

## 2021-05-08 DIAGNOSIS — Z510 Encounter for antineoplastic radiation therapy: Secondary | ICD-10-CM | POA: Insufficient documentation

## 2021-05-08 DIAGNOSIS — C3431 Malignant neoplasm of lower lobe, right bronchus or lung: Secondary | ICD-10-CM | POA: Insufficient documentation

## 2021-05-08 DIAGNOSIS — Z79899 Other long term (current) drug therapy: Secondary | ICD-10-CM | POA: Insufficient documentation

## 2021-05-08 NOTE — Telephone Encounter (Signed)
Pt LVM w/ answering svc stating he needed to cx his 05/08/21 tx. Informed Miranda on L4. Advised she may want to reach out due to the pt not stating why he needed to cx. ?

## 2021-05-09 ENCOUNTER — Telehealth: Payer: Self-pay | Admitting: Radiation Oncology

## 2021-05-09 ENCOUNTER — Ambulatory Visit: Payer: Medicare Other | Admitting: Internal Medicine

## 2021-05-09 ENCOUNTER — Ambulatory Visit: Payer: Medicare Other

## 2021-05-09 ENCOUNTER — Other Ambulatory Visit: Payer: Medicare Other

## 2021-05-09 NOTE — Progress Notes (Unsigned)
[place] OFFICE PROGRESS NOTE  [place] [address]  DIAGNOSIS: Stage IIIA (T4, N0, M0) non-small cell lung cancer, favoring adenocarcinoma presented with large right lower lobe lung mass with no mediastinal lymphadenopathy or extrathoracic metastasis diagnosed in October 2022. Had disease progression with enlarging mass and new right hilar and mediastinal lymph nodes in December 2022.   PRIOR THERAPY: Neoadjuvant treatment with carboplatin for AUC of 5, Alimta 500 Mg/M2 and nivolumab 360 Mg IV every 3 weeks.  First dose January 23, 2021.  Status post 2 cycles.  CURRENT THERAPY: Concurrent chemoradiation with carboplatin for an AUC of 2 and paclitaxel 45 mg per metered squared.  First dose expected on 04/01/2021. Status post 4 cycles.   INTERVAL HISTORY: Brandon Rubio. 71 y.o. male returns to the clinic today for follow-up visit.  For the last several weeks, the patient has been reporting fatigue, generalized weakness, lightheadedness, and dizziness.  He has not received any chemotherapy since 04/22/2021 due to his symptoms as well as thrombocytopenia.  The patient underwent IV fluids twice in the clinic and once in the emergency room for his concern with mild/minimal improvement.  The patient's EKG did not show any arrhythmia.  Eating and drinking?  The patient missed a few days of his radiation last week due to not feeling well.  He is expected to complete radiation this Friday on 05/17/2021.  Today would be his last day of chemotherapy with his current regimen.  Today, the patient is feeling ***.  He denies any fever or chills.  He denies any significant odynophagia and dysphagia from radiation.  His appetite is ***.  He denies any extremity weakness, numbness, tingling, or speech changes.  Denies any history of arrhythmia.  He reports his baseline dyspnea exertion.  Denies any significant cough but has some postnasal  drainage/phlegm production which is clear.  Denies hemoptysis.  Denies any abnormal bleeding or bruising.  Denies any chest pain or palpitations.  Blood sugar this morning?  8 this morning?  He is here today for evaluation, repeat blood work, and consideration of starting his last cycle of treatment.   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  MEDICATIONS:  Current Outpatient Medications  Medication Sig Dispense Refill   aspirin EC 81 MG tablet Take 81 mg by mouth in the morning. Swallow whole.     blood glucose meter kit and supplies KIT Dispense based on patient and insurance preference. Use up to four times daily as directed. 1 each 0   empagliflozin (JARDIANCE) 25 MG TABS tablet Take 1 tablet (25 mg total) by mouth daily. 90 tablet 3   folic acid (FOLVITE) 1 MG tablet Take 1 tablet (1 mg total) by mouth daily. 30 tablet 4   glucose blood (FREESTYLE LITE) test strip USE TO TEST BLOOD SUGAR TWICE DAILY ICD E11.49 100 each 3   HYDROcodone-acetaminophen (NORCO) 5-325 MG tablet Take 1 tablet by mouth every 6 (  six) hours as needed for moderate pain. (Patient not taking: Reported on 04/29/2021) 30 tablet 0   insulin glargine (LANTUS) 100 UNIT/ML Solostar Pen Inject 15 Units into the skin daily. 4.5 mL 1   Melatonin 5 MG CHEW Chew 5 mg by mouth at bedtime.     metFORMIN (GLUCOPHAGE) 1000 MG tablet TAKE 1 TABLET BY MOUTH TWICE DAILY WITH A MEAL 180 tablet 1   metoprolol succinate (TOPROL-XL) 50 MG 24 hr tablet Take 1 tablet (50 mg total) by mouth daily. Take with or  immediately following a meal. 90 tablet 1   Multiple Vitamin (MULTIVITAMIN WITH MINERALS) TABS tablet Take 1 tablet by mouth daily. Centrum Silver     nicotine (NICODERM CQ - DOSED IN MG/24 HOURS) 21 mg/24hr patch Place 1 patch (21 mg total) onto the skin daily. (Patient not taking: Reported on 03/26/2021) 28 patch 0   omeprazole (PRILOSEC) 20 MG capsule TAKE 1 CAPSULE(20 MG) BY MOUTH TWICE DAILY BEFORE A MEAL 180 capsule 3   prochlorperazine (COMPAZINE) 10 MG tablet Take 1 tablet (10 mg total) by mouth every 6 (six) hours as needed for nausea or vomiting. 30 tablet 0   simvastatin (ZOCOR) 40 MG tablet Take 1 tablet (40 mg total) by mouth daily at 6 PM. 90 tablet 3   traZODone (DESYREL) 50 MG tablet Take 1-2 tablets (50-100 mg total) by mouth at bedtime as needed for sleep. (Patient taking differently: Take 50-100 mg by mouth at bedtime as needed for sleep. Pt states taking two tablets) 180 tablet 0   triamcinolone cream (KENALOG) 0.1 % Apply 1 application topically 2 (two) times daily as needed (poison ivy).     No current facility-administered medications for this visit.    SURGICAL HISTORY:  Past Surgical History:  Procedure Laterality Date   BRONCHIAL BIOPSY  12/10/2020   Procedure: BRONCHIAL BIOPSIES;  Surgeon: Collene Gobble, MD;  Location: [facility] ENDOSCOPY;  Service: Pulmonary;;   BRONCHIAL BRUSHINGS  12/10/2020   Procedure: BRONCHIAL BRUSHINGS;  Surgeon: Collene Gobble, MD;  Location: [facility] ENDOSCOPY;  Service: Pulmonary;;   BRONCHIAL NEEDLE ASPIRATION BIOPSY  12/10/2020   Procedure: BRONCHIAL NEEDLE ASPIRATION BIOPSIES;  Surgeon: Collene Gobble, MD;  Location: [facility] ENDOSCOPY;  Service: Pulmonary;;   COLONOSCOPY     COLONOSCOPY W/ POLYPECTOMY  03/10/2009   LARYNGOSCOPY  03/10/1997   VIDEO BRONCHOSCOPY WITH ENDOBRONCHIAL NAVIGATION N/A 12/10/2020   Procedure: ROBOTIC [place];  Surgeon: Collene Gobble, MD;  Location: [place] ENDOSCOPY;  Service: Pulmonary;   Laterality: N/A;    REVIEW OF SYSTEMS:   Review of Systems  Constitutional: Negative for appetite change, chills, fatigue, fever and unexpected weight change.  HENT:   Negative for mouth sores, nosebleeds, sore throat and trouble swallowing.   Eyes: Negative for eye problems and icterus.  Respiratory: Negative for cough, hemoptysis, shortness of breath and wheezing.   Cardiovascular: Negative for chest pain and leg swelling.  Gastrointestinal: Negative for abdominal pain, constipation, diarrhea, nausea and vomiting.  Genitourinary: Negative for bladder incontinence, difficulty urinating, dysuria, frequency and hematuria.   Musculoskeletal: Negative for back pain, gait problem, neck pain and neck stiffness.  Skin: Negative for itching and rash.  Neurological: Negative for dizziness, extremity weakness, gait problem, headaches, light-headedness and seizures.  Hematological: Negative for adenopathy. Does not bruise/bleed easily.  Psychiatric/Behavioral: Negative for confusion, depression and sleep disturbance. The patient is not nervous/anxious.     PHYSICAL EXAMINATION:  There were no vitals taken for this visit.  ECOG PERFORMANCE STATUS: {CHL ONC ECOG [id number]  Physical Exam  Constitutional: Oriented to person, place, and time and well-developed, well-nourished, and in no distress. No distress.  HENT:  Head: Normocephalic and atraumatic.  Mouth/Throat: Oropharynx is clear and moist. No oropharyngeal exudate.  Eyes: Conjunctivae are normal. Right eye exhibits no discharge. Left eye exhibits no discharge. No scleral icterus.  Neck: Normal range of motion. Neck supple.  Cardiovascular: Normal rate, regular rhythm, normal heart sounds and intact distal pulses.   Pulmonary/Chest: Effort normal and breath sounds normal. No respiratory distress. No wheezes. No rales.  Abdominal: Soft. Bowel sounds are normal. Exhibits no distension and no mass. There is no tenderness.   Musculoskeletal: Normal range of motion. Exhibits no edema.  Lymphadenopathy:    No cervical adenopathy.  Neurological: Alert and oriented to person, place, and time. Exhibits normal muscle tone. Gait normal. Coordination normal.  Skin: Skin is warm and dry. No rash noted. Not diaphoretic. No erythema. No pallor.  Psychiatric: Mood, memory and judgment normal.  Vitals reviewed.  LABORATORY DATA: Lab Results  Component Value Date   WBC 3.5 (L) 05/06/2021   HGB 12.5 (L) 05/06/2021   HCT 36.2 (L) 05/06/2021   MCV 92.6 05/06/2021   PLT 62 (L) 05/06/2021      Chemistry      Component Value Date/Time   NA 135 05/06/2021 1055   K 4.1 05/06/2021 1055   CL 102 05/06/2021 1055   CO2 19 (L) 05/06/2021 1055   BUN 15 05/06/2021 1055   CREATININE 0.94 05/06/2021 1055      Component Value Date/Time   CALCIUM 9.4 05/06/2021 1055   ALKPHOS 55 05/06/2021 1055   AST 17 05/06/2021 1055   ALT 12 05/06/2021 1055   BILITOT 0.7 05/06/2021 1055       RADIOGRAPHIC STUDIES:  DG Chest Portable 1 View  Result Date: 05/06/2021 CLINICAL DATA:  Weak EXAM: PORTABLE CHEST 1 VIEW COMPARISON:  December 2022 FINDINGS: Increased density at the right lung base. Patchy atelectasis left lung base. No pleural effusion or pneumothorax. Similar cardiomediastinal contours. IMPRESSION: Increased density at the right lung base likely reflecting known mass and radiation changes. Mild atelectasis left lung base. Electronically Signed   By: Macy Mis M.D.   On: 05/06/2021 13:31     ASSESSMENT/PLAN:  This is a very pleasant 71 year old Caucasian male diagnosed with stage IIIa (T4, N1, M0) non-small cell lung cancer, favor to be adenocarcinoma.  He initially presented with a right lower lobe lung mass with no mediastinal lymphadenopathy or extrathoracic metastases.  He was diagnosed in October 2022.  He showed evidence of disease progression after 2 cycles of neoadjuvant treatment with enlarging right lower lobe  lung mass and new enlarging right hilar mediastinal lymph nodes.  The patient underwent 2 cycles of neoadjuvant systemic chemotherapy with carboplatin for an AUC of 5, Alimta 500 mg per metered squared and nivolumab 360 mg IV every 3 weeks.  He status post 4 cycles.  He did not get treatment this week or last week due to thrombocytopenia with a platelet count of 68 K and ***.  Denies any abnormal bleeding.   The patient reports decreased fluid intake this week.  Denies any significant odynophagia or dysphagia from radiation.  He reports a normal appetite.  Denies any palpitations, chest pain, or changes with his baseline dyspnea on exertion.  Denies any facial drooping, speech changes, numbness and tingling, or extremity weakness. Overall, he is well appearing today.   The  patient was seen with Dr. Julien Nordmann today.  Labs were reviewed.  Dr. Julien Nordmann recommends ***.  He will ***echo 5 today as scheduled.  Last***  I will arrange for restaging CT scan of the chest in 3-1/2 weeks.  We will see him back for follow-up visit a few days later for evaluation and to review his scan results and for more detailed discussion about his current condition and neck steps.  Call back if not feeling well***   Brain MRI***  The patient was advised to call immediately if she has any concerning symptoms in the interval. The patient voices understanding of current disease status and treatment options and is in agreement with the current care plan. All questions were answered. The patient knows to call the clinic with any problems, questions or concerns. We can certainly see the patient much sooner if necessary        No orders of the defined types were placed in this encounter.    I spent {CHL ONC TIME VISIT - GZFPO:[id number]} counseling the patient face to face. The total time spent in the appointment was {CHL ONC TIME VISIT - ZXYOF:[id number]}.  Tinzley Dalia L Karolyn Messing, PA-C 05/09/21

## 2021-05-09 NOTE — Telephone Encounter (Signed)
Received a delayed vm from the operator from this patient. Patient stated he was very sick and needed to cx his 8:15 am tx. Spoke w/ Miranda on L4 to confirm she spoke with patient. ?

## 2021-05-10 ENCOUNTER — Emergency Department (HOSPITAL_COMMUNITY): Payer: Medicare Other

## 2021-05-10 ENCOUNTER — Other Ambulatory Visit: Payer: Self-pay

## 2021-05-10 ENCOUNTER — Telehealth: Payer: Self-pay | Admitting: Physician Assistant

## 2021-05-10 ENCOUNTER — Inpatient Hospital Stay (HOSPITAL_COMMUNITY)
Admission: EM | Admit: 2021-05-10 | Discharge: 2021-05-16 | DRG: 312 | Disposition: A | Discharge status: Home Health | Payer: Medicare Other | Attending: Internal Medicine | Admitting: Internal Medicine

## 2021-05-10 ENCOUNTER — Ambulatory Visit: Payer: Medicare Other

## 2021-05-10 ENCOUNTER — Encounter (HOSPITAL_COMMUNITY): Payer: Self-pay

## 2021-05-10 DIAGNOSIS — Z79899 Other long term (current) drug therapy: Secondary | ICD-10-CM

## 2021-05-10 DIAGNOSIS — E785 Hyperlipidemia, unspecified: Secondary | ICD-10-CM | POA: Diagnosis present

## 2021-05-10 DIAGNOSIS — I10 Essential (primary) hypertension: Secondary | ICD-10-CM | POA: Diagnosis not present

## 2021-05-10 DIAGNOSIS — E878 Other disorders of electrolyte and fluid balance, not elsewhere classified: Secondary | ICD-10-CM | POA: Insufficient documentation

## 2021-05-10 DIAGNOSIS — E871 Hypo-osmolality and hyponatremia: Secondary | ICD-10-CM | POA: Diagnosis not present

## 2021-05-10 DIAGNOSIS — I951 Orthostatic hypotension: Secondary | ICD-10-CM | POA: Diagnosis not present

## 2021-05-10 DIAGNOSIS — C3431 Malignant neoplasm of lower lobe, right bronchus or lung: Secondary | ICD-10-CM | POA: Diagnosis present

## 2021-05-10 DIAGNOSIS — D61810 Antineoplastic chemotherapy induced pancytopenia: Secondary | ICD-10-CM | POA: Diagnosis present

## 2021-05-10 DIAGNOSIS — Z7984 Long term (current) use of oral hypoglycemic drugs: Secondary | ICD-10-CM | POA: Diagnosis not present

## 2021-05-10 DIAGNOSIS — Z6827 Body mass index (BMI) 27.0-27.9, adult: Secondary | ICD-10-CM

## 2021-05-10 DIAGNOSIS — Z20822 Contact with and (suspected) exposure to covid-19: Secondary | ICD-10-CM | POA: Diagnosis present

## 2021-05-10 DIAGNOSIS — E119 Type 2 diabetes mellitus without complications: Secondary | ICD-10-CM | POA: Diagnosis not present

## 2021-05-10 DIAGNOSIS — Z8249 Family history of ischemic heart disease and other diseases of the circulatory system: Secondary | ICD-10-CM

## 2021-05-10 DIAGNOSIS — E1142 Type 2 diabetes mellitus with diabetic polyneuropathy: Secondary | ICD-10-CM | POA: Diagnosis present

## 2021-05-10 DIAGNOSIS — E876 Hypokalemia: Secondary | ICD-10-CM | POA: Diagnosis present

## 2021-05-10 DIAGNOSIS — Z794 Long term (current) use of insulin: Secondary | ICD-10-CM | POA: Diagnosis not present

## 2021-05-10 DIAGNOSIS — Z880 Allergy status to penicillin: Secondary | ICD-10-CM | POA: Diagnosis not present

## 2021-05-10 DIAGNOSIS — R7989 Other specified abnormal findings of blood chemistry: Secondary | ICD-10-CM | POA: Diagnosis not present

## 2021-05-10 DIAGNOSIS — R5381 Other malaise: Secondary | ICD-10-CM

## 2021-05-10 DIAGNOSIS — E872 Acidosis, unspecified: Secondary | ICD-10-CM | POA: Diagnosis not present

## 2021-05-10 DIAGNOSIS — T451X5A Adverse effect of antineoplastic and immunosuppressive drugs, initial encounter: Secondary | ICD-10-CM | POA: Diagnosis present

## 2021-05-10 DIAGNOSIS — Z86718 Personal history of other venous thrombosis and embolism: Secondary | ICD-10-CM | POA: Diagnosis not present

## 2021-05-10 DIAGNOSIS — I2510 Atherosclerotic heart disease of native coronary artery without angina pectoris: Secondary | ICD-10-CM | POA: Diagnosis present

## 2021-05-10 DIAGNOSIS — E1143 Type 2 diabetes mellitus with diabetic autonomic (poly)neuropathy: Secondary | ICD-10-CM | POA: Diagnosis present

## 2021-05-10 DIAGNOSIS — J439 Emphysema, unspecified: Secondary | ICD-10-CM | POA: Diagnosis not present

## 2021-05-10 DIAGNOSIS — R109 Unspecified abdominal pain: Secondary | ICD-10-CM | POA: Diagnosis not present

## 2021-05-10 DIAGNOSIS — C3491 Malignant neoplasm of unspecified part of right bronchus or lung: Secondary | ICD-10-CM | POA: Diagnosis present

## 2021-05-10 DIAGNOSIS — I252 Old myocardial infarction: Secondary | ICD-10-CM

## 2021-05-10 DIAGNOSIS — E1110 Type 2 diabetes mellitus with ketoacidosis without coma: Secondary | ICD-10-CM | POA: Diagnosis present

## 2021-05-10 DIAGNOSIS — R55 Syncope and collapse: Secondary | ICD-10-CM | POA: Diagnosis not present

## 2021-05-10 DIAGNOSIS — E2740 Unspecified adrenocortical insufficiency: Secondary | ICD-10-CM | POA: Diagnosis present

## 2021-05-10 DIAGNOSIS — R627 Adult failure to thrive: Secondary | ICD-10-CM

## 2021-05-10 DIAGNOSIS — Z7982 Long term (current) use of aspirin: Secondary | ICD-10-CM

## 2021-05-10 DIAGNOSIS — E222 Syndrome of inappropriate secretion of antidiuretic hormone: Secondary | ICD-10-CM | POA: Diagnosis present

## 2021-05-10 DIAGNOSIS — R42 Dizziness and giddiness: Secondary | ICD-10-CM | POA: Diagnosis not present

## 2021-05-10 DIAGNOSIS — I700 Atherosclerosis of aorta: Secondary | ICD-10-CM | POA: Diagnosis not present

## 2021-05-10 DIAGNOSIS — K219 Gastro-esophageal reflux disease without esophagitis: Secondary | ICD-10-CM | POA: Diagnosis present

## 2021-05-10 DIAGNOSIS — R000 Tachycardia, unspecified: Secondary | ICD-10-CM | POA: Diagnosis not present

## 2021-05-10 DIAGNOSIS — E860 Dehydration: Secondary | ICD-10-CM | POA: Diagnosis present

## 2021-05-10 DIAGNOSIS — R0602 Shortness of breath: Secondary | ICD-10-CM | POA: Diagnosis not present

## 2021-05-10 DIAGNOSIS — R918 Other nonspecific abnormal finding of lung field: Secondary | ICD-10-CM | POA: Diagnosis not present

## 2021-05-10 DIAGNOSIS — Z800 Family history of malignant neoplasm of digestive organs: Secondary | ICD-10-CM

## 2021-05-10 DIAGNOSIS — D61818 Other pancytopenia: Secondary | ICD-10-CM

## 2021-05-10 DIAGNOSIS — D701 Agranulocytosis secondary to cancer chemotherapy: Secondary | ICD-10-CM

## 2021-05-10 DIAGNOSIS — Z801 Family history of malignant neoplasm of trachea, bronchus and lung: Secondary | ICD-10-CM

## 2021-05-10 DIAGNOSIS — E118 Type 2 diabetes mellitus with unspecified complications: Secondary | ICD-10-CM

## 2021-05-10 DIAGNOSIS — R0609 Other forms of dyspnea: Secondary | ICD-10-CM

## 2021-05-10 DIAGNOSIS — F17210 Nicotine dependence, cigarettes, uncomplicated: Secondary | ICD-10-CM | POA: Diagnosis present

## 2021-05-10 LAB — COMPREHENSIVE METABOLIC PANEL
ALT: 14 U/L (ref 0–44)
AST: 17 U/L (ref 15–41)
Albumin: 4.1 g/dL (ref 3.5–5.0)
Alkaline Phosphatase: 64 U/L (ref 38–126)
Anion gap: 16 — ABNORMAL HIGH (ref 5–15)
BUN: 13 mg/dL (ref 8–23)
CO2: 13 mmol/L — ABNORMAL LOW (ref 22–32)
Calcium: 9 mg/dL (ref 8.9–10.3)
Chloride: 103 mmol/L (ref 98–111)
Creatinine, Ser: 1.02 mg/dL (ref 0.61–1.24)
GFR, Estimated: >60 mL/min (ref >60)
Glucose, Bld: 135 mg/dL — ABNORMAL HIGH (ref 70–99)
Potassium: 4.1 mmol/L (ref 3.5–5.1)
Sodium: 132 mmol/L — ABNORMAL LOW (ref 135–145)
Total Bilirubin: 0.8 mg/dL (ref 0.3–1.2)
Total Protein: 8 g/dL (ref 6.5–8.1)

## 2021-05-10 LAB — CBC WITH DIFFERENTIAL/PLATELET
Abs Immature Granulocytes: 0.01 10*3/uL (ref 0.00–0.07)
Basophils Absolute: 0 10*3/uL (ref 0.0–0.1)
Basophils Relative: 1 %
Eosinophils Absolute: 0.2 10*3/uL (ref 0.0–0.5)
Eosinophils Relative: 6 %
HCT: 38.3 % — ABNORMAL LOW (ref 39.0–52.0)
Hemoglobin: 13.1 g/dL (ref 13.0–17.0)
Immature Granulocytes: 0 %
Lymphocytes Relative: 20 %
Lymphs Abs: 0.5 10*3/uL — ABNORMAL LOW (ref 0.7–4.0)
MCH: 32.4 pg (ref 26.0–34.0)
MCHC: 34.2 g/dL (ref 30.0–36.0)
MCV: 94.8 fL (ref 80.0–100.0)
Monocytes Absolute: 0.3 10*3/uL (ref 0.1–1.0)
Monocytes Relative: 12 %
Neutro Abs: 1.5 10*3/uL — ABNORMAL LOW (ref 1.7–7.7)
Neutrophils Relative %: 61 %
Platelets: 99 10*3/uL — ABNORMAL LOW (ref 150–400)
RBC: 4.04 MIL/uL — ABNORMAL LOW (ref 4.22–5.81)
RDW: 15.1 % (ref 11.5–15.5)
WBC: 2.5 10*3/uL — ABNORMAL LOW (ref 4.0–10.5)
nRBC: 0 % (ref 0.0–0.2)

## 2021-05-10 LAB — URINALYSIS, ROUTINE W REFLEX MICROSCOPIC
Bacteria, UA: NONE SEEN
Bilirubin Urine: NEGATIVE
Glucose, UA: 150 mg/dL — AB
Hgb urine dipstick: NEGATIVE
Ketones, ur: 80 mg/dL — AB
Leukocytes,Ua: NEGATIVE
Nitrite: NEGATIVE
Protein, ur: 30 mg/dL — AB
Specific Gravity, Urine: 1.025 (ref 1.005–1.030)
pH: 5 (ref 5.0–8.0)

## 2021-05-10 LAB — BASIC METABOLIC PANEL
Anion gap: 13 (ref 5–15)
BUN: 11 mg/dL (ref 8–23)
CO2: 10 mmol/L — ABNORMAL LOW (ref 22–32)
Calcium: 7.6 mg/dL — ABNORMAL LOW (ref 8.9–10.3)
Chloride: 108 mmol/L (ref 98–111)
Creatinine, Ser: 0.74 mg/dL (ref 0.61–1.24)
GFR, Estimated: >60 mL/min (ref >60)
Glucose, Bld: 103 mg/dL — ABNORMAL HIGH (ref 70–99)
Potassium: 3.7 mmol/L (ref 3.5–5.1)
Sodium: 131 mmol/L — ABNORMAL LOW (ref 135–145)

## 2021-05-10 LAB — RESP PANEL BY RT-PCR (FLU A&B, COVID) ARPGX2
Influenza A by PCR: NEGATIVE
Influenza B by PCR: NEGATIVE
SARS Coronavirus 2 by RT PCR: NEGATIVE

## 2021-05-10 LAB — TROPONIN I (HIGH SENSITIVITY)
Troponin I (High Sensitivity): 6 ng/L (ref <18)
Troponin I (High Sensitivity): 6 ng/L (ref <18)

## 2021-05-10 LAB — PROTIME-INR
INR: 1.1 (ref 0.8–1.2)
Prothrombin Time: 14.6 seconds (ref 11.4–15.2)

## 2021-05-10 LAB — LACTIC ACID, PLASMA
Lactic Acid, Venous: 1.3 mmol/L (ref 0.5–1.9)
Lactic Acid, Venous: 1.1 mmol/L (ref 0.5–1.9)

## 2021-05-10 IMAGING — CT CT ABD-PELV W/ CM
2 of 5 series · 16 of 46 positions shown, 18 images · IV contrast (agent unspecified)
Comparison: None.

CLINICAL DATA: Abdominal pain, nonlocalized

EXAM:
CT ABDOMEN AND PELVIS WITH CONTRAST
TECHNIQUE: Multidetector CT imaging of the abdomen and pelvis was performed
using the standard protocol following bolus administration of
intravenous contrast.

[Series 2: axial st · axial · 0.77mm/px · z∈[-982,-562]mm · 13 of 98 slices shown, 15 images]
[im 7/98  soft-tissue]
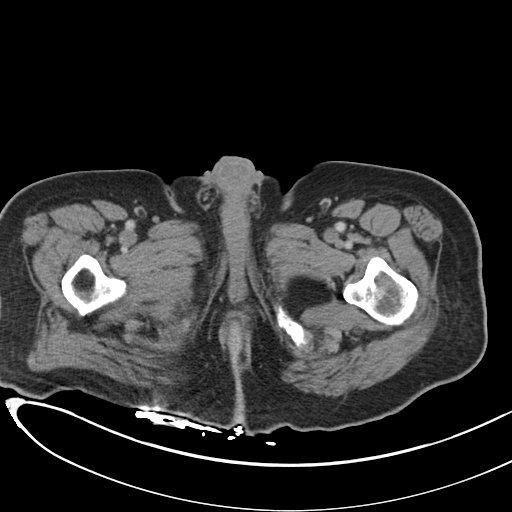
[im 7/98  bone]
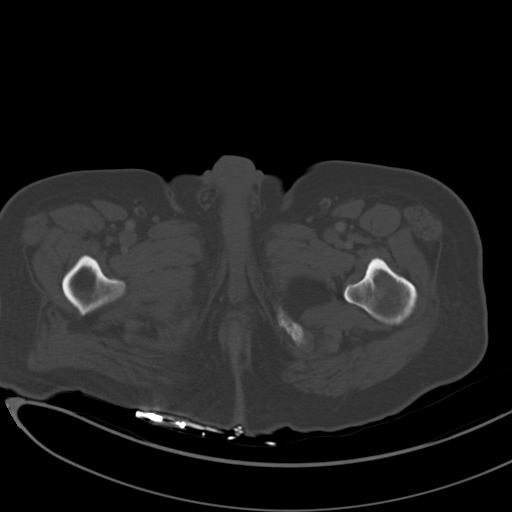
[im 13/98  soft-tissue]
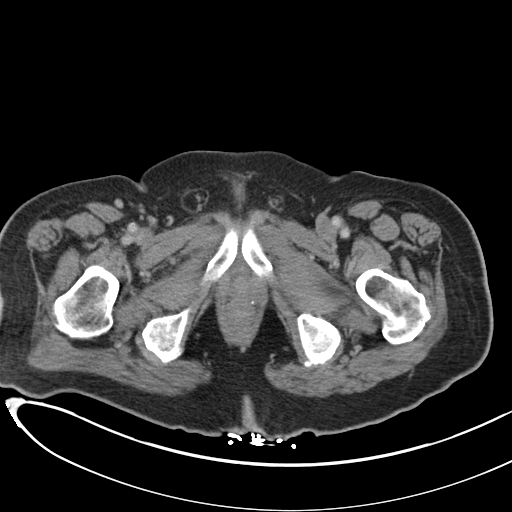
[im 20/98  soft-tissue]
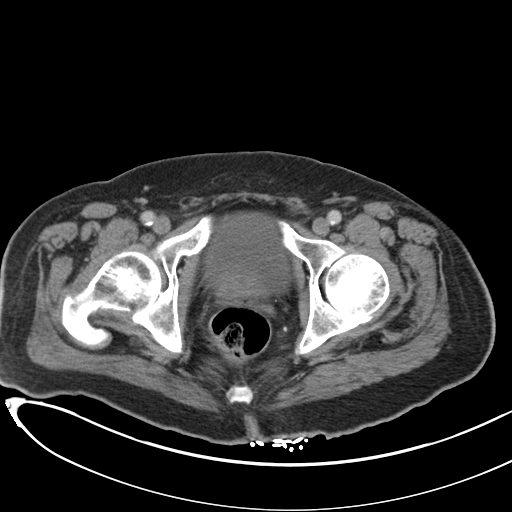
[im 26/98  soft-tissue]
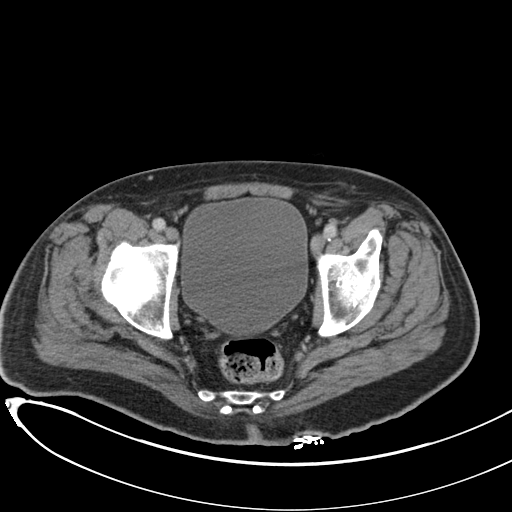
[im 33/98  soft-tissue]
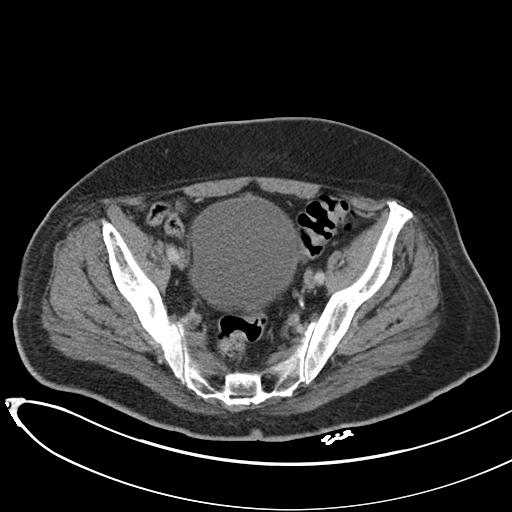
[im 39/98  soft-tissue]
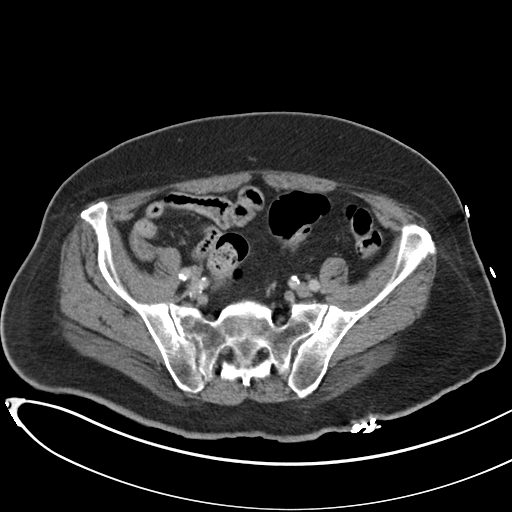
[im 52/98  soft-tissue]
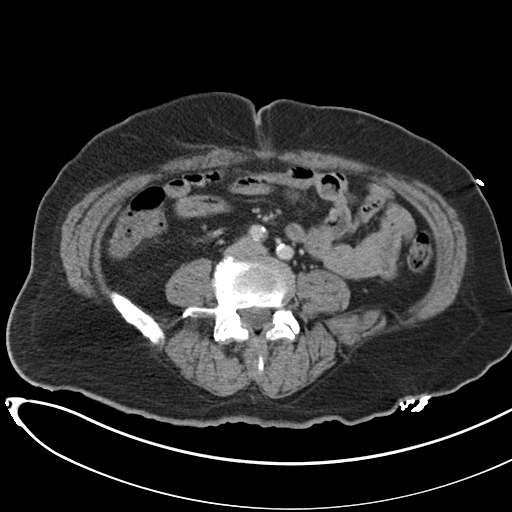
[im 59/98  soft-tissue]
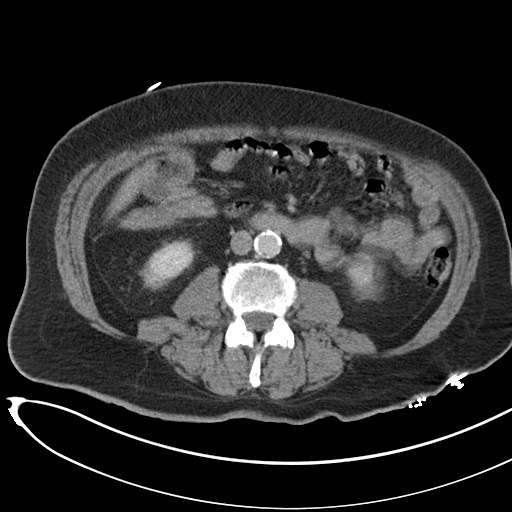
[im 65/98  soft-tissue]
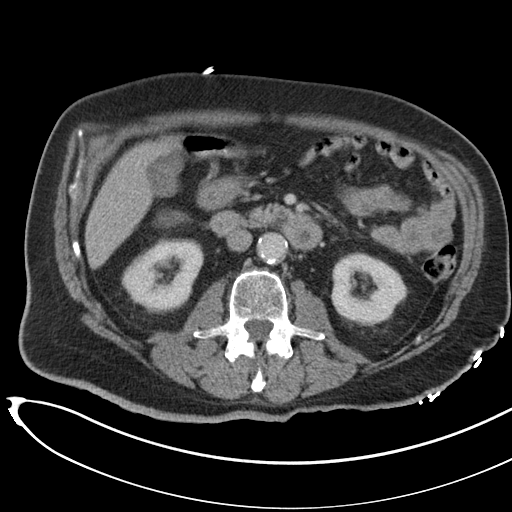
[im 65/98  bone]
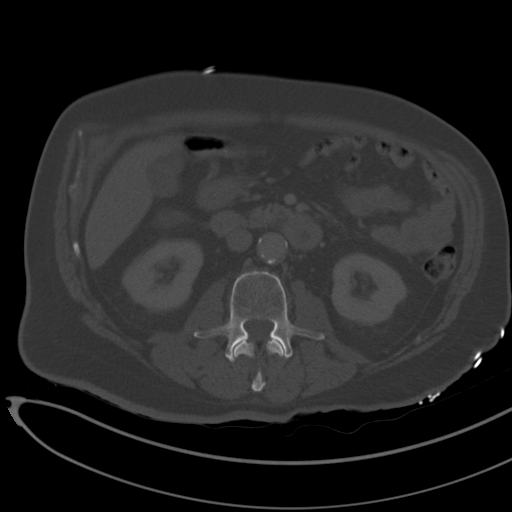
[im 72/98  soft-tissue]
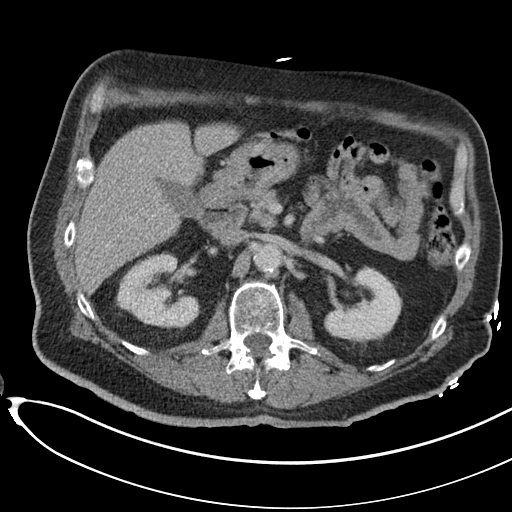
[im 78/98  soft-tissue]
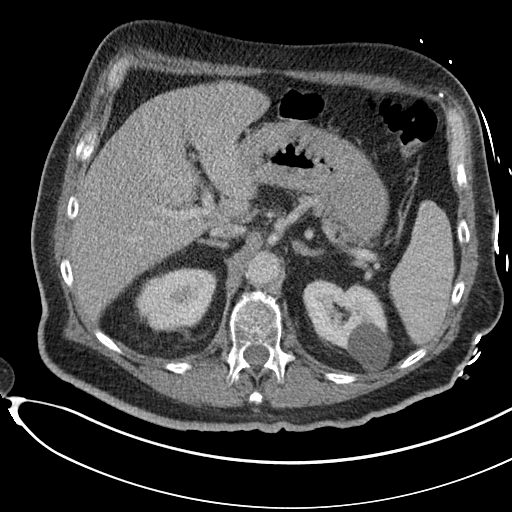
[im 85/98  soft-tissue]
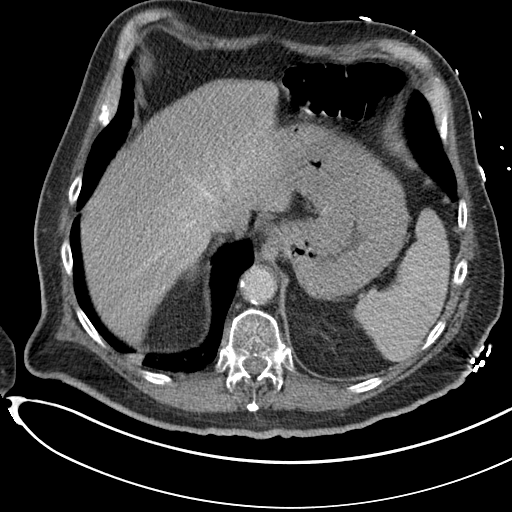
[im 91/98  soft-tissue]
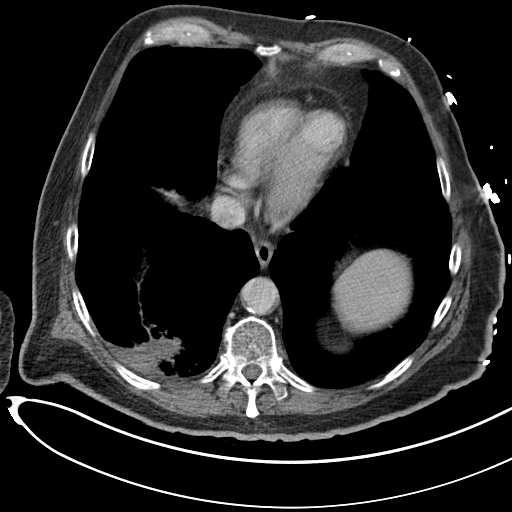

[Series 5: coronal st · coronal · 0.77mm/px · 3 of 104 slices shown]
[im 35/104  soft-tissue]
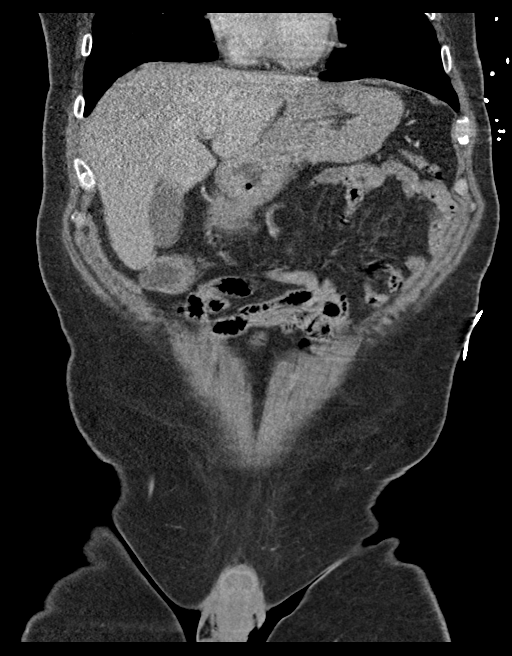
[im 46/104  soft-tissue]
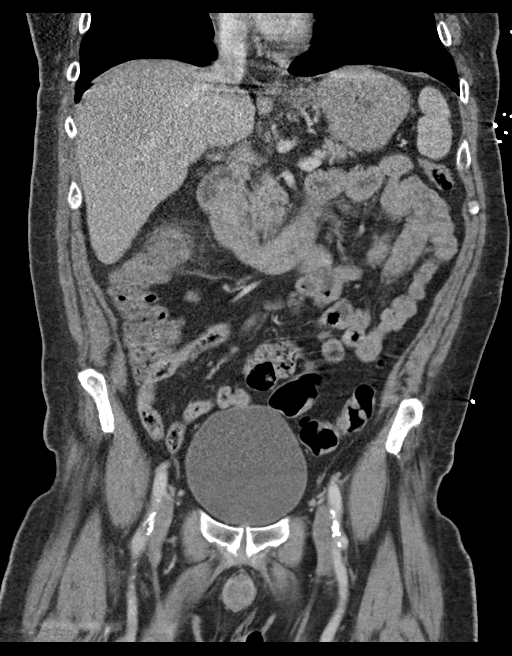
[im 58/104  soft-tissue]
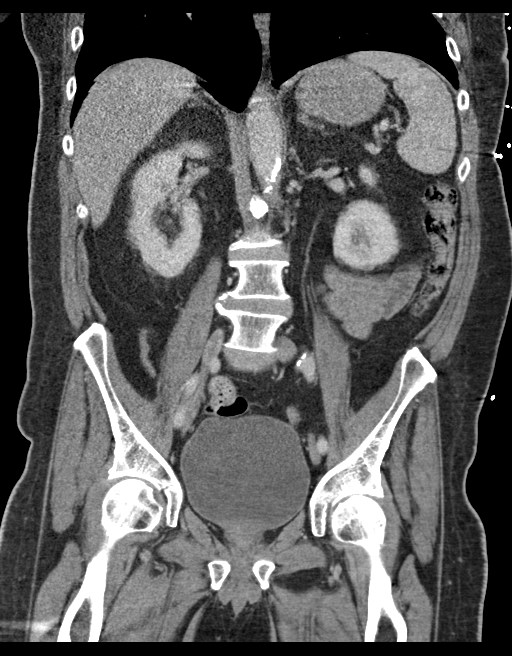

[16 of 46 positions shown; findings below may reference images not displayed]

RADIATION DOSE REDUCTION: This exam was performed according to the
departmental dose-optimization program which includes automated
exposure control, adjustment of the mA and/or kV according to
patient size and/or use of iterative reconstruction technique.

CONTRAST:  80mL OMNIPAQUE IOHEXOL 350 MG/ML SOLN
FINDINGS: Lower chest: See separately dictated CT of the chest for discussion
of findings above the diaphragm.

Hepatobiliary: No focal liver abnormality is seen. No gallstones,
gallbladder wall thickening, or biliary dilatation.

Pancreas: Unremarkable. No pancreatic ductal dilatation or
surrounding inflammatory changes.

Spleen: Normal in size without focal abnormality.

Adrenals/Urinary Tract: Bilateral adrenal glands are unremarkable.
No hydronephrosis or nephrolithiasis. Simple cyst of the upper pole
of the left kidney. Normal bladder.

Stomach/Bowel: Stomach is within normal limits. Appendix appears
normal. No evidence of bowel wall thickening, distention, or
inflammatory changes.

Vascular/Lymphatic: Aortic atherosclerosis. No enlarged abdominal or
pelvic lymph nodes.

Reproductive: Prostate calcifications, prostate otherwise
unremarkable.

Other: Small fat containing right inguinal hernia. No abdominopelvic
ascites.

Musculoskeletal: No acute or significant osseous findings.
IMPRESSION: 1. No acute findings in the abdomen or pelvis.
2. See separately dictated CT of the chest for discussion of
findings above the diaphragm.
3.  Aortic Atherosclerosis ([40]-[40]).

## 2021-05-10 IMAGING — CT CT HEAD W/O CM
3 series · 15 of 47 positions shown, 18 images · non-contrast
Comparison: MRI brain [DATE]

CLINICAL DATA: Headache



[Series 2: head wo · axial · 0.43mm/px · z∈[-209,-69]mm · 9 of 34 slices shown, 12 images]
[im 3/34  brain]
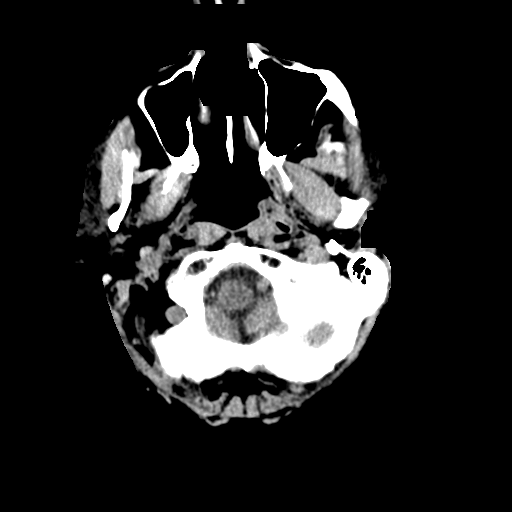
[im 3/34  bone]
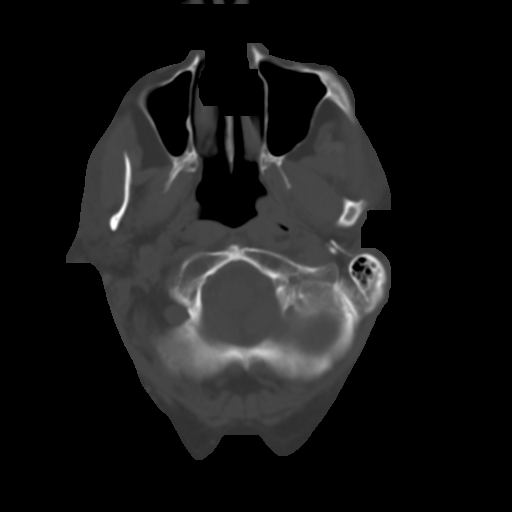
[im 6/34  brain]
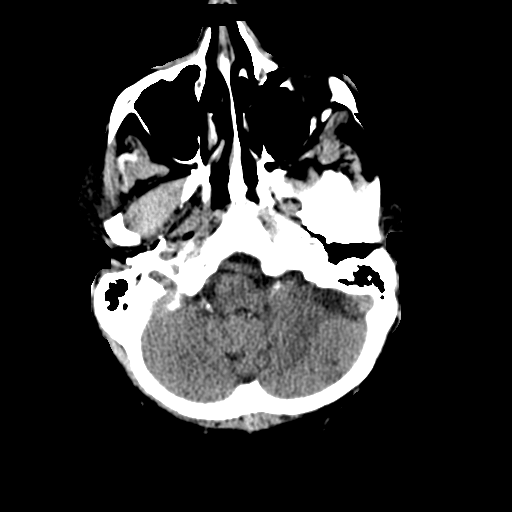
[im 10/34  brain]
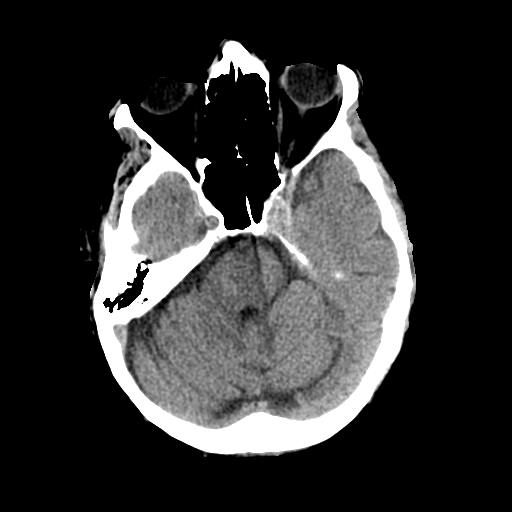
[im 13/34  brain]
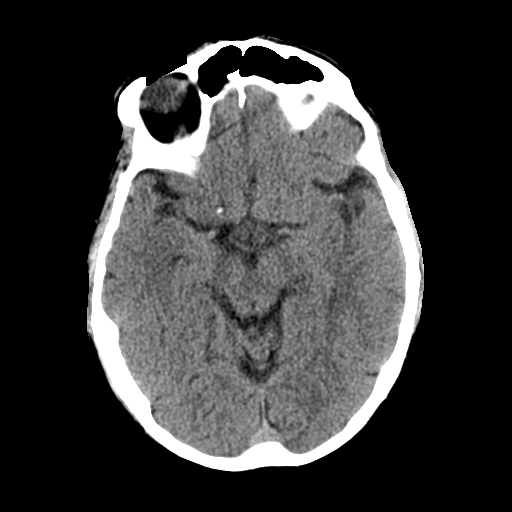
[im 18/34  brain]
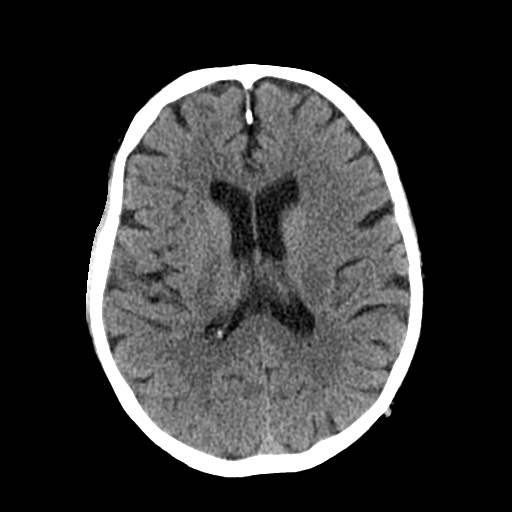
[im 18/34  bone]
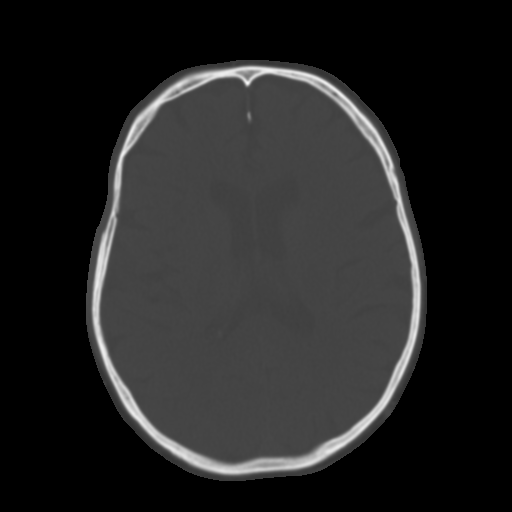
[im 21/34  brain]
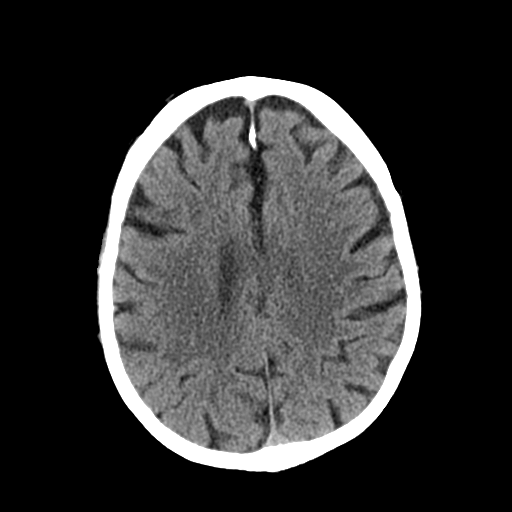
[im 24/34  brain]
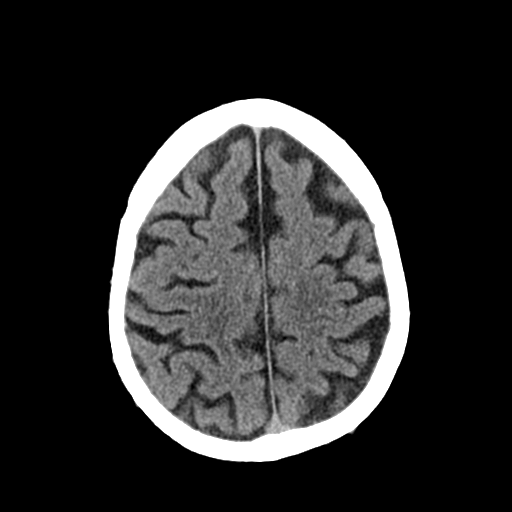
[im 28/34  brain]
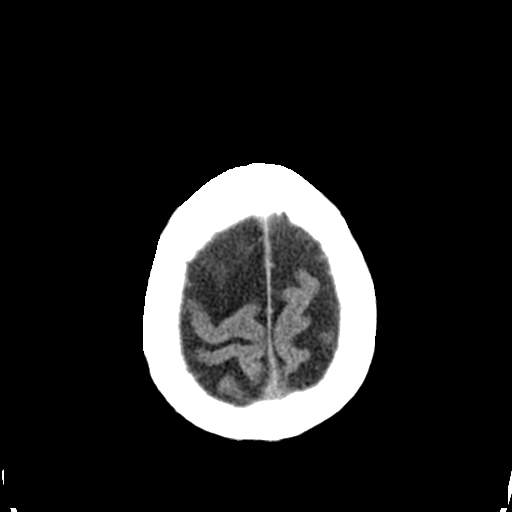
[im 31/34  brain]
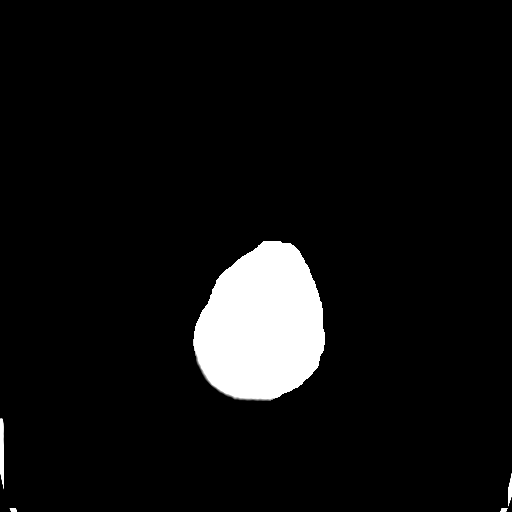
[im 31/34  bone]
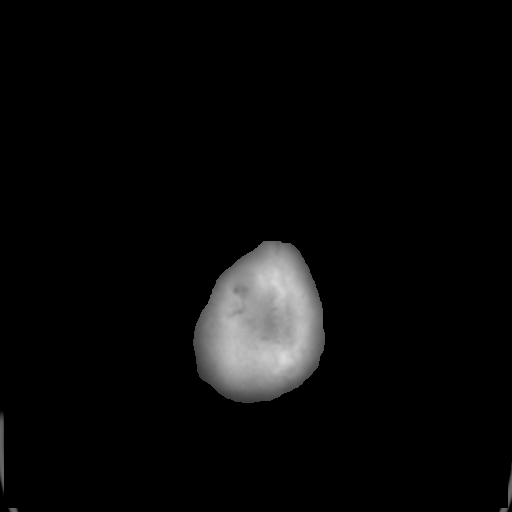

[Series 5: coronal soft tissue · coronal · 0.30mm/px · 3 of 67 slices shown]
[im 24/67  brain]
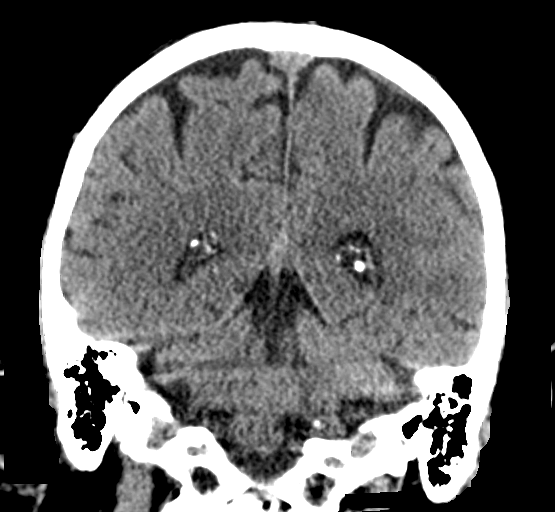
[im 30/67  brain]
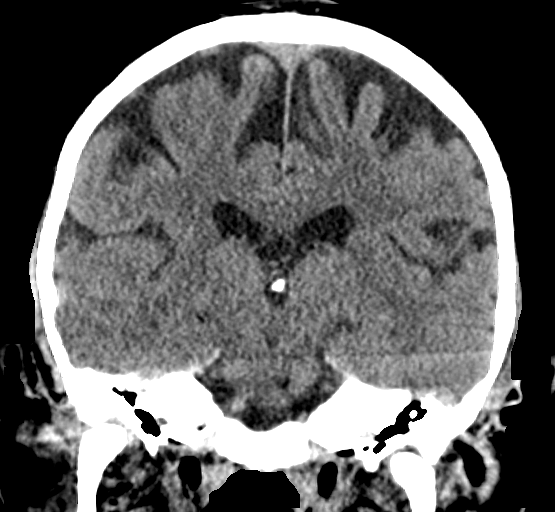
[im 37/67  brain]
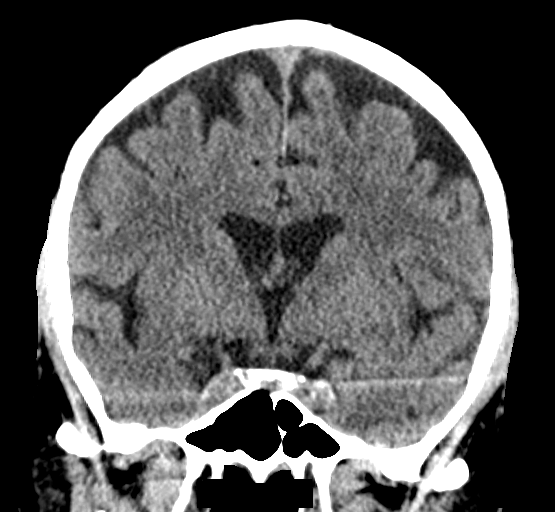

[Series 6: sagittal soft tissue · sagittal · 0.30mm/px · 3 of 56 slices shown]
[im 19/56  brain]
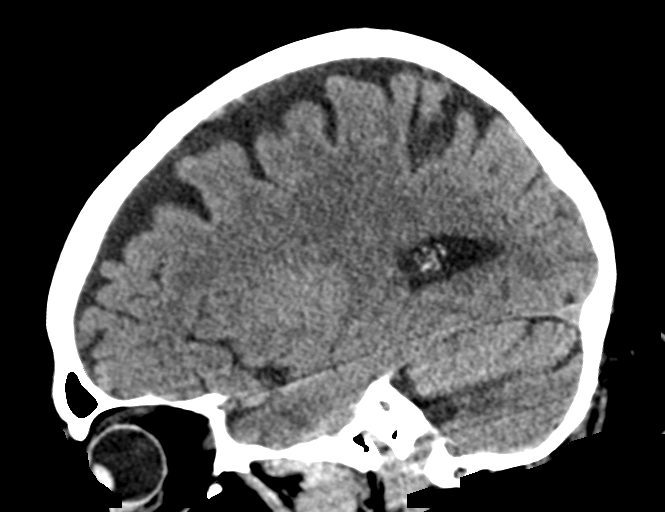
[im 28/56  brain]
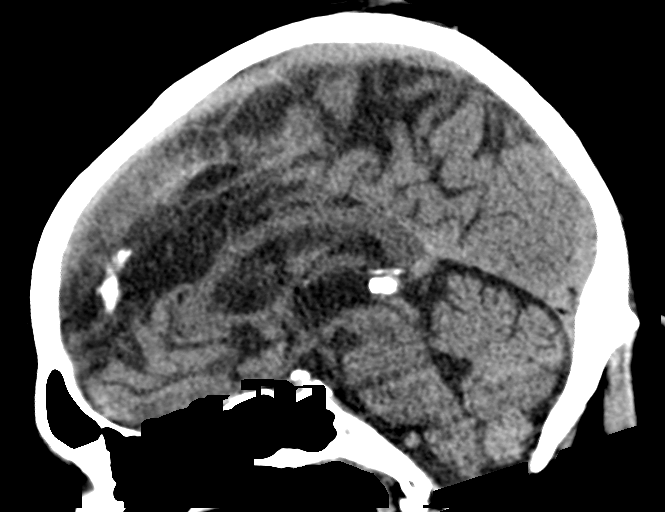
[im 37/56  brain]
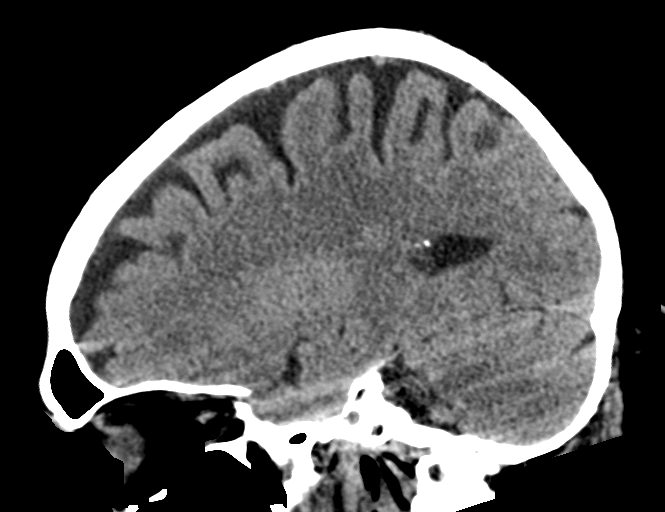

[15 of 47 positions shown; findings below may reference images not displayed]

FINDINGS: Brain: No acute intracranial hemorrhage, mass effect, or herniation.
No extra-axial fluid collections. No evidence of acute territorial
infarct. No hydrocephalus. Moderate cortical volume loss. Mild
patchy hypodensities in the periventricular and subcortical white
matter, likely secondary to chronic microvascular ischemic changes.

Vascular: No hyperdense vessel or unexpected calcification.

Skull: Normal. Negative for fracture or focal lesion.

Sinuses/Orbits: No acute finding.

Other: None.
IMPRESSION: Chronic changes with no acute intracranial process identified.

## 2021-05-10 IMAGING — CT CT ANGIO CHEST
2 of 6 series · 18 of 46 positions shown · IV contrast (agent unspecified)
Comparison: Chest CT, [DATE].

CLINICAL DATA: c/o SOB and dizziness, especially with movement x
2-3 days. Was seen here [REDACTED] for dizziness and nausea. Pt has lung
CA, last received chemo and radiation [REDACTED].

EXAM:
CT ANGIOGRAPHY CHEST WITH CONTRAST
TECHNIQUE: Multidetector CT imaging of the chest was performed using the
standard protocol during bolus administration of intravenous
contrast. Multiplanar CT image reconstructions and MIPs were
obtained to evaluate the vascular anatomy.

[Series 5: thins · axial · 0.77mm/px · z∈[-645,-351]mm · 15 of 322 slices shown]
[im 14/322  lung]
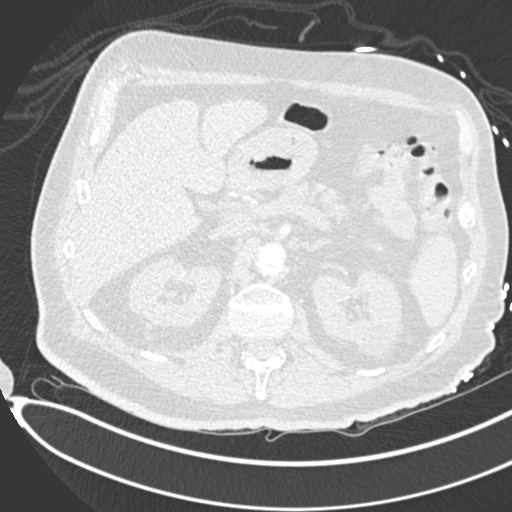
[im 42/322  soft-tissue]
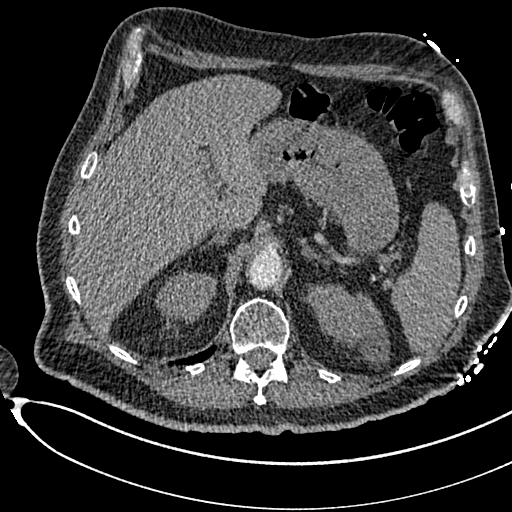
[im 56/322  lung]
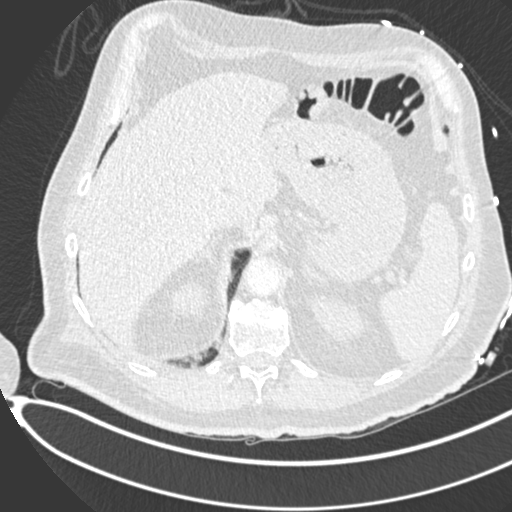
[im 84/322  soft-tissue]
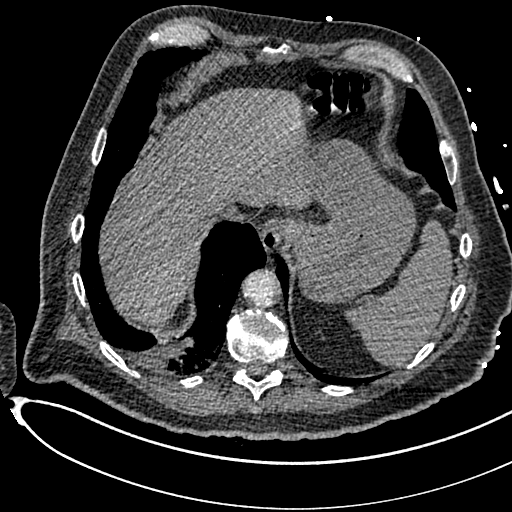
[im 98/322  lung]
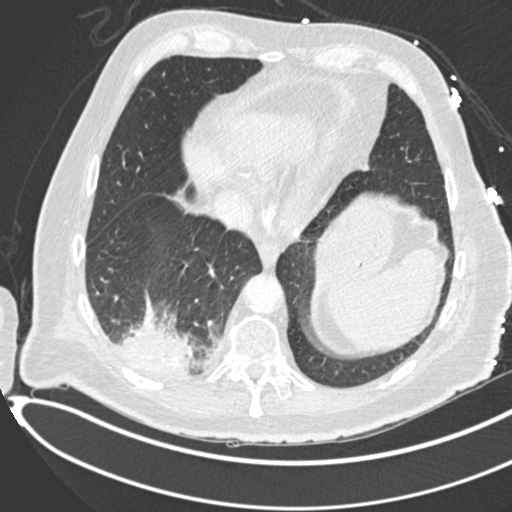
[im 126/322  soft-tissue]
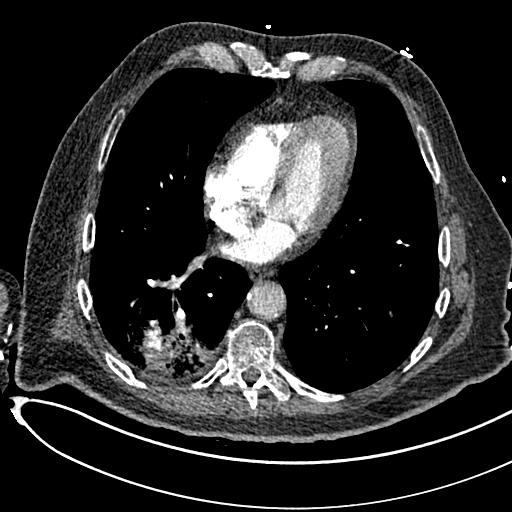
[im 140/322  lung]
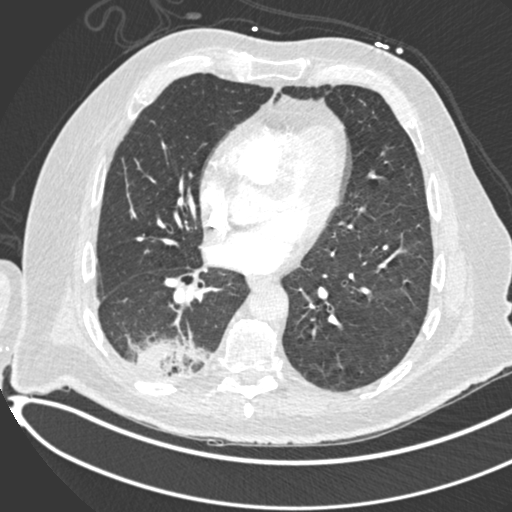
[im 168/322  soft-tissue]
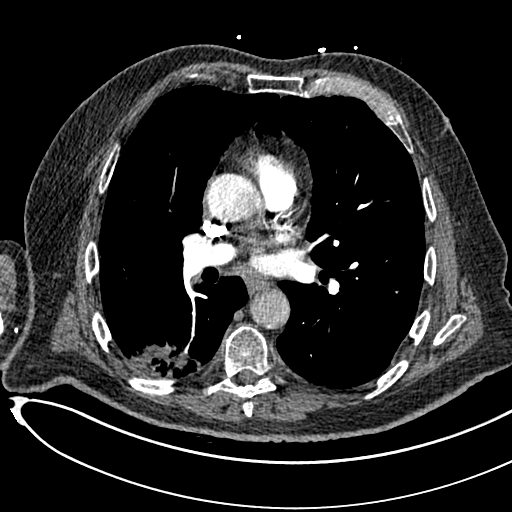
[im 182/322  lung]
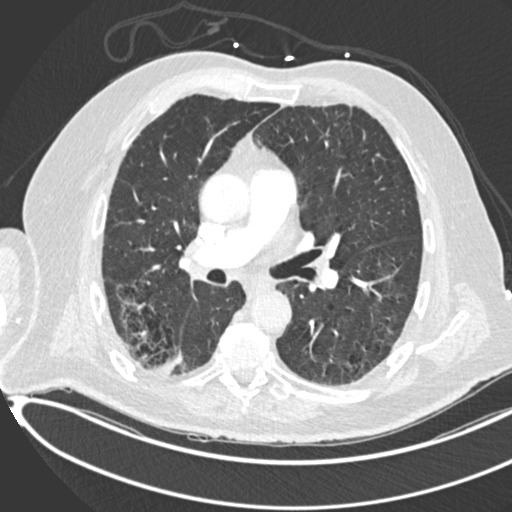
[im 196/322  soft-tissue]
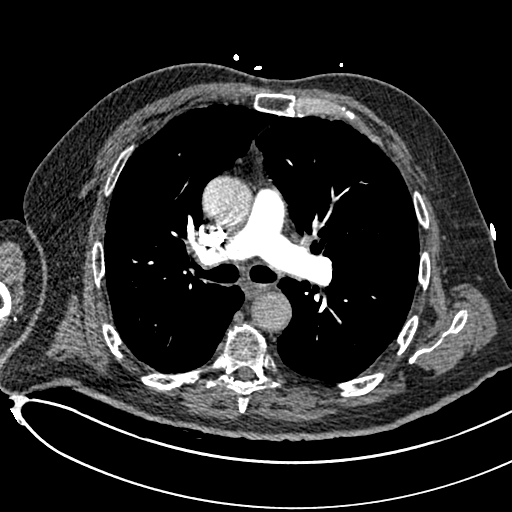
[im 224/322  lung]
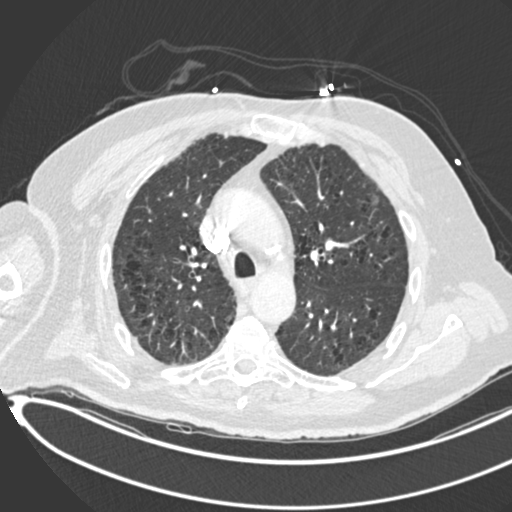
[im 238/322  soft-tissue]
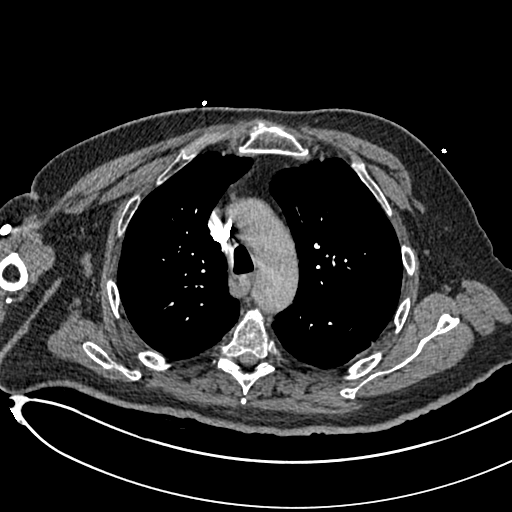
[im 266/322  lung]
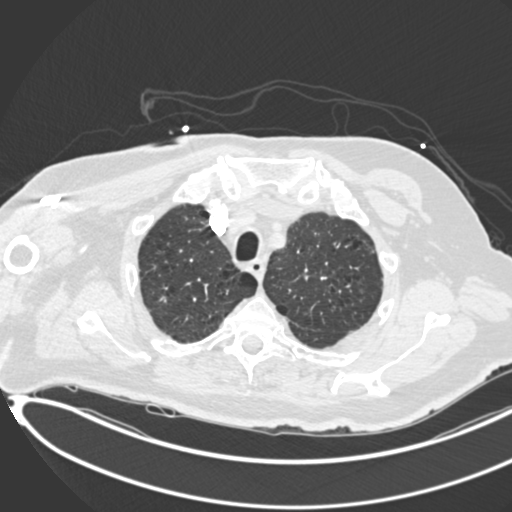
[im 280/322  soft-tissue]
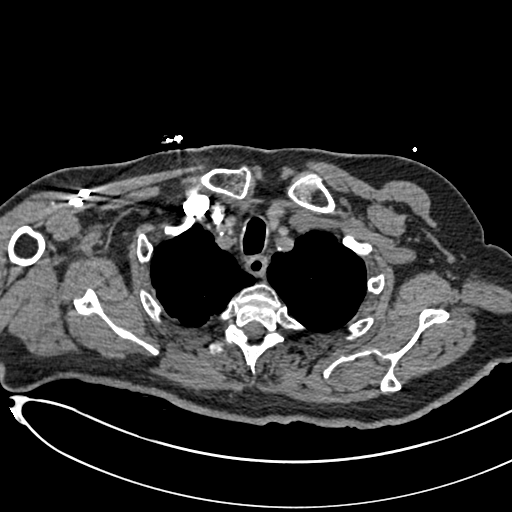
[im 308/322  lung]
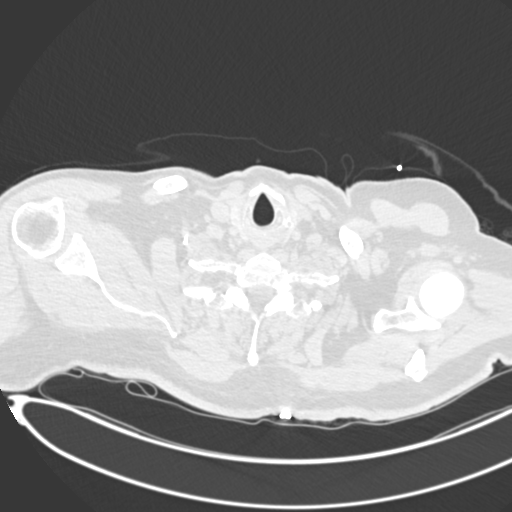

[Series 7: coronal mpr · coronal · 0.68mm/px · 3 of 166 slices shown]
[im 42/166  soft-tissue]
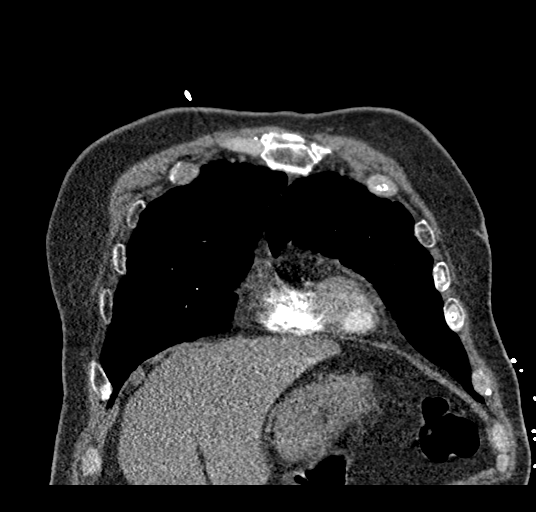
[im 83/166  soft-tissue]
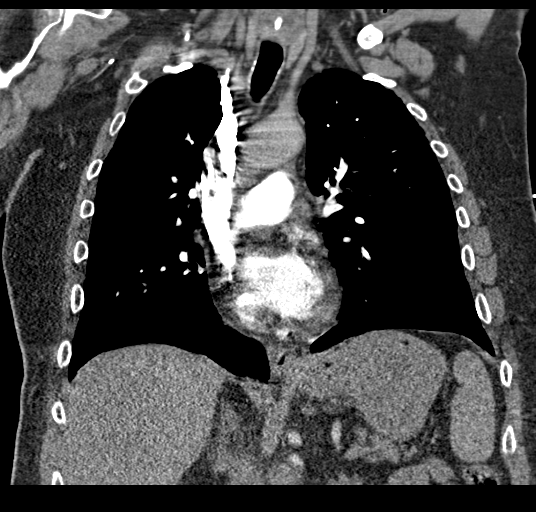
[im 124/166  soft-tissue]
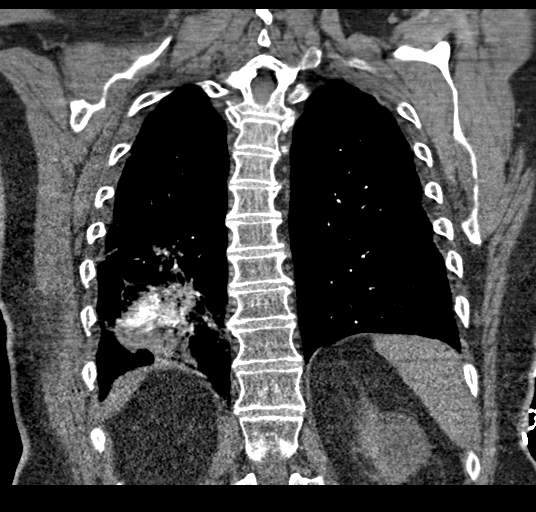

[18 of 46 positions shown; findings below may reference images not displayed]

RADIATION DOSE REDUCTION: This exam was performed according to the
departmental dose-optimization program which includes automated
exposure control, adjustment of the mA and/or kV according to
patient size and/or use of iterative reconstruction technique.

CONTRAST:  80mL OMNIPAQUE IOHEXOL 350 MG/ML SOLN
FINDINGS: Cardiovascular: Pulmonary arteries are well opacified. There is no
evidence of a pulmonary embolism.

Heart is normal in size and configuration. No pericardial effusion.
Great vessels are normal in caliber. Mild aortic atherosclerosis.

Mediastinum/Nodes: Normal thyroid. No neck base, mediastinal or
hilar masses or enlarged lymph nodes. Trachea and esophagus are
unremarkable.

Lungs/Pleura: Pleural based ill-defined right lower lobe mass
measures approximately 5.9 x 3.9 x 5 cm, decreased in size from the
prior exam. Ground-glass type opacities and linear opacities lie
along the borders of the mass, and opacity and small cystic spaces
extend superior to the mass, mildly decreased from the prior CT.

No new lung masses. No suspicious nodules. No other areas of
consolidation. Stable emphysema.

No pleural effusion or pneumothorax.

Upper Abdomen: No acute abnormality.

Musculoskeletal: No fracture or acute finding.  No bone lesion.

Review of the MIP images confirms the above findings.
IMPRESSION: 1. No evidence of a pulmonary embolism. No acute findings in the
chest.
2. Right lower lobe mass and adjacent opacities have decreased in
size from the prior CT consistent with a positive response to
therapy for non-small cell lung carcinoma.

Aortic Atherosclerosis ([MV]-[MV]) and Emphysema ([MV]-[MV]).

## 2021-05-10 IMAGING — DX DG CHEST 1V PORT
2 series · 2 of 2 positions shown · non-contrast
Comparison: [DATE]

CLINICAL DATA: Shortness of breath

EXAM:
PORTABLE CHEST 1 VIEW

[chest ap (1 of 2)]
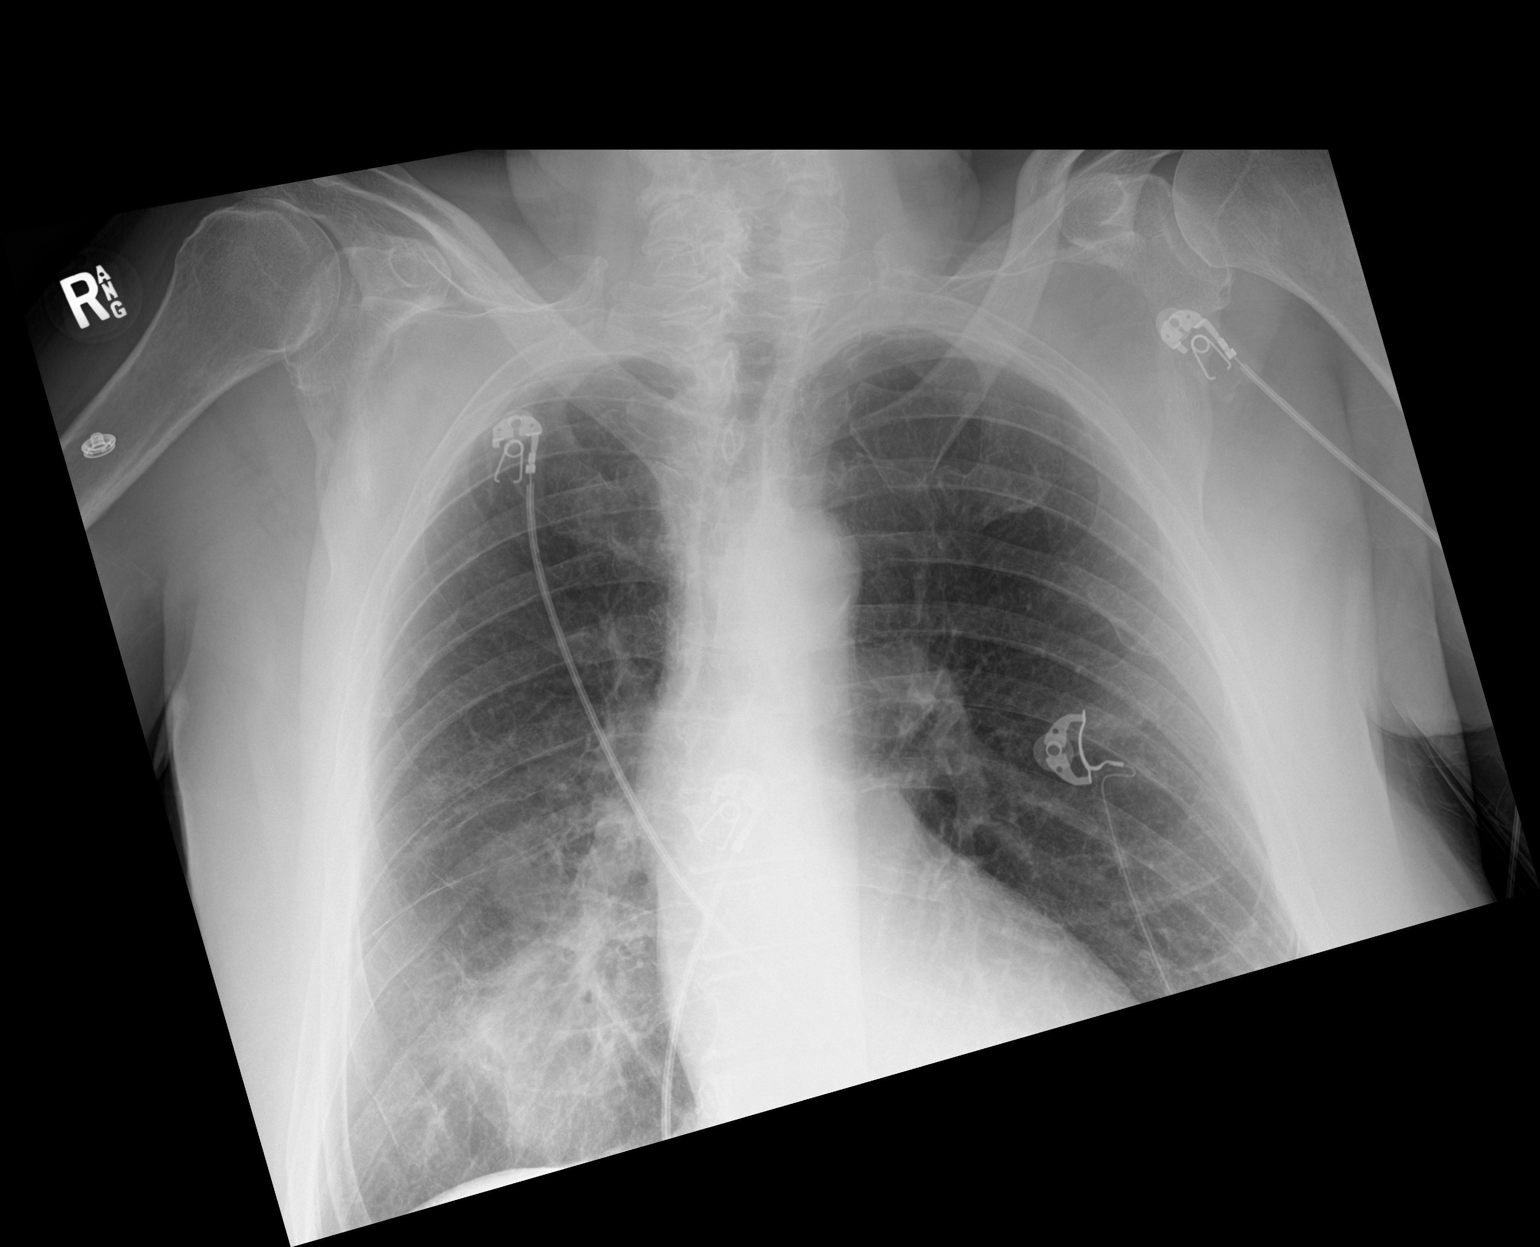

[chest ap (2 of 2)]
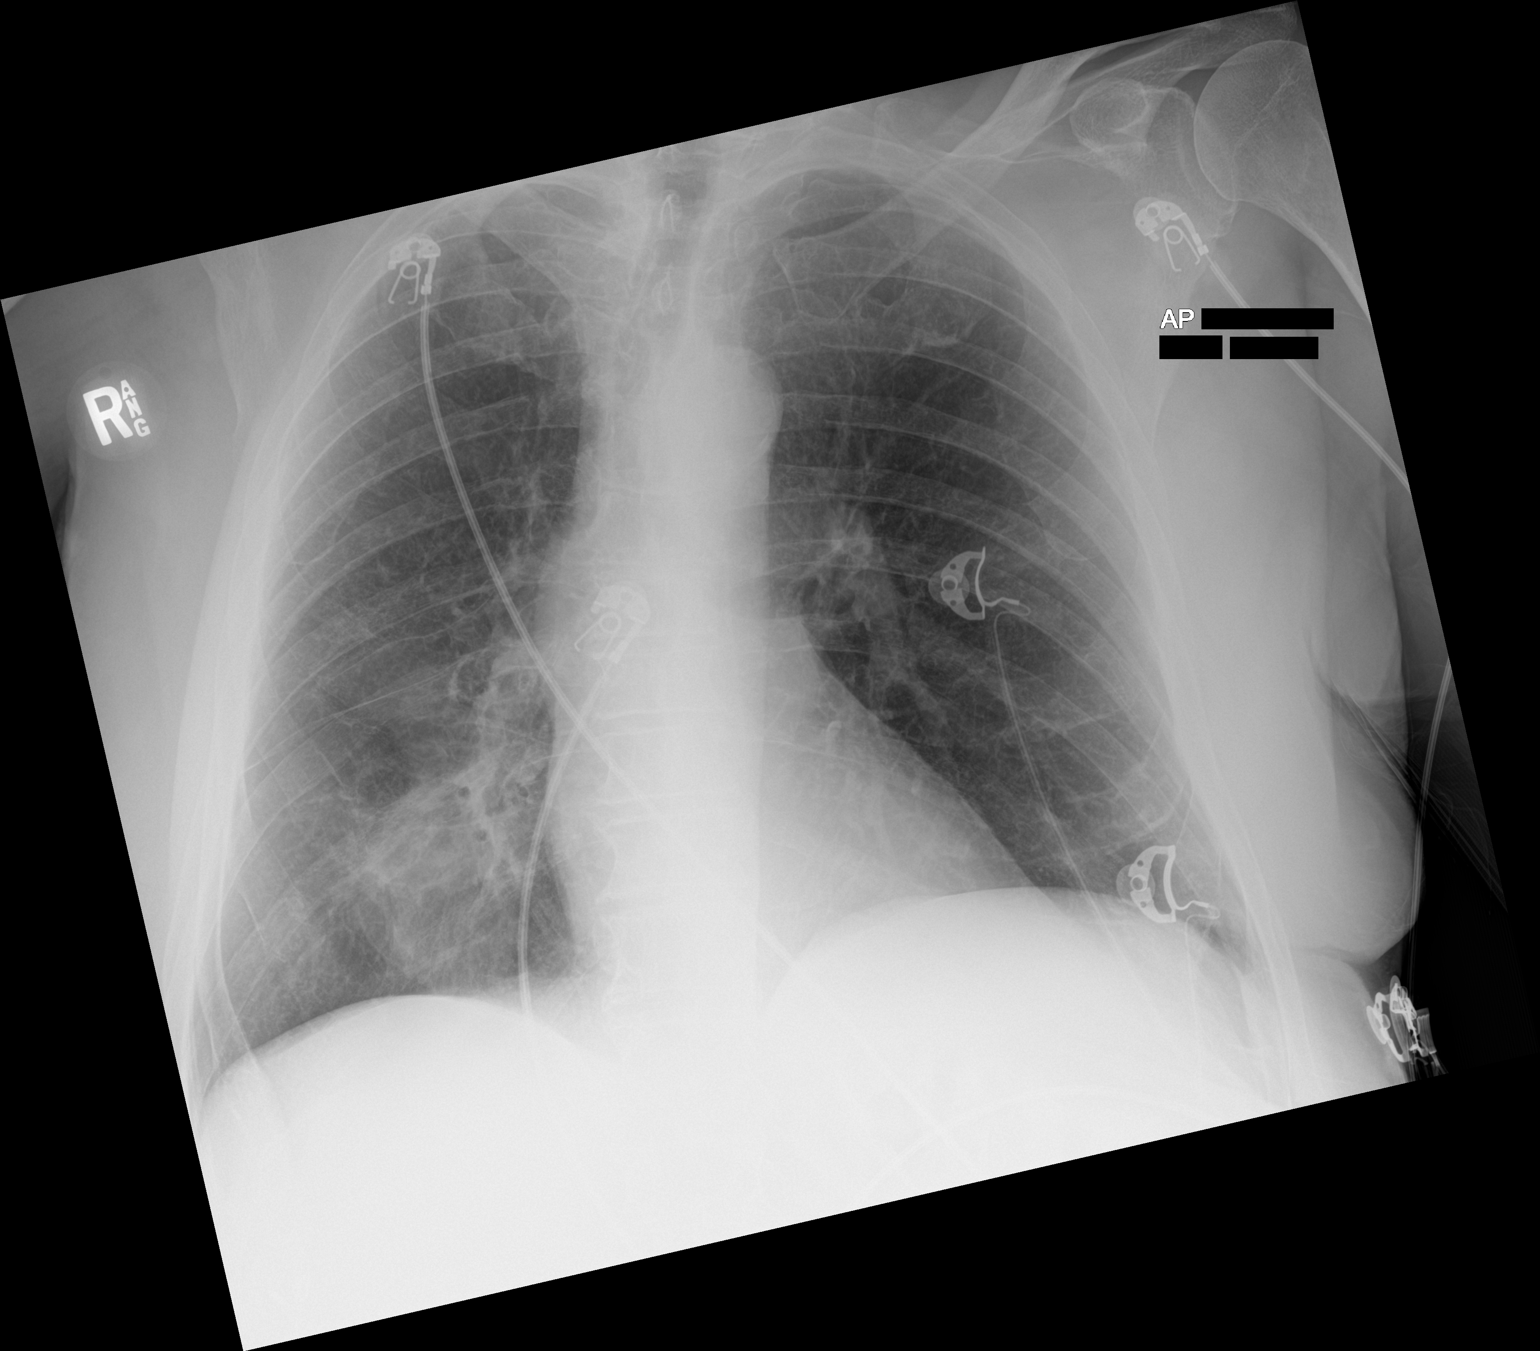

[2 of 2 positions shown; findings below may reference images not displayed]

FINDINGS: Increased right basilar opacity. Right lung mass and radiation
changes are present on prior CT in this region. No pleural effusion
or pneumothorax. Stable normal heart size.
IMPRESSION: Increased right basilar opacity. May reflect progression of
radiation changes or other atelectasis/consolidation.

## 2021-05-10 MED ORDER — SODIUM CHLORIDE 0.9 % IV SOLN: Freq: Once | INTRAVENOUS | Status: AC

## 2021-05-10 MED ORDER — LACTATED RINGERS IV SOLN: INTRAVENOUS | Status: DC

## 2021-05-10 MED ORDER — FOLIC ACID 1 MG PO TABS
1.0000 mg | ORAL_TABLET | Freq: Every day | ORAL | Status: DC
  Administered 2021-05-11 – 2021-05-16 (×6): 1 mg via ORAL
  Filled 2021-05-10 (×6): qty 1

## 2021-05-10 MED ORDER — MELATONIN 5 MG PO TABS
5.0000 mg | ORAL_TABLET | Freq: Every evening | ORAL | Status: DC | PRN
  Administered 2021-05-13 – 2021-05-14 (×2): 5 mg via ORAL
  Filled 2021-05-10 (×2): qty 1

## 2021-05-10 MED ORDER — SODIUM CHLORIDE 0.9 % IV BOLUS
1000.0000 mL | Freq: Once | INTRAVENOUS | Status: AC
  Administered 2021-05-10: 1000 mL via INTRAVENOUS

## 2021-05-10 MED ORDER — LACTATED RINGERS IV BOLUS
1000.0000 mL | Freq: Once | INTRAVENOUS | Status: AC
  Administered 2021-05-10: 1000 mL via INTRAVENOUS

## 2021-05-10 MED ORDER — SIMVASTATIN 40 MG PO TABS
40.0000 mg | ORAL_TABLET | Freq: Every day | ORAL | Status: DC
  Administered 2021-05-11 – 2021-05-15 (×5): 40 mg via ORAL
  Filled 2021-05-10 (×5): qty 1

## 2021-05-10 MED ORDER — ASPIRIN EC 81 MG PO TBEC
81.0000 mg | DELAYED_RELEASE_TABLET | Freq: Every morning | ORAL | Status: DC
Start: 2021-05-11 — End: 2021-05-16
  Administered 2021-05-11 – 2021-05-16 (×6): 81 mg via ORAL
  Filled 2021-05-10 (×6): qty 1

## 2021-05-10 MED ORDER — HYDROCODONE-ACETAMINOPHEN 5-325 MG PO TABS
1.0000 | ORAL_TABLET | Freq: Four times a day (QID) | ORAL | Status: DC | PRN
Start: 2021-05-10 — End: 2021-05-16
  Administered 2021-05-11 – 2021-05-15 (×11): 1 via ORAL
  Filled 2021-05-10 (×11): qty 1

## 2021-05-10 MED ORDER — IOHEXOL 350 MG/ML SOLN
80.0000 mL | Freq: Once | INTRAVENOUS | Status: AC | PRN
  Administered 2021-05-10: 80 mL via INTRAVENOUS

## 2021-05-10 MED ORDER — SODIUM CHLORIDE 0.9 % IV SOLN: INTRAVENOUS | Status: DC

## 2021-05-10 MED ORDER — PANTOPRAZOLE SODIUM 40 MG PO TBEC
40.0000 mg | DELAYED_RELEASE_TABLET | Freq: Every day | ORAL | Status: DC
Start: 2021-05-11 — End: 2021-05-16
  Administered 2021-05-11 – 2021-05-16 (×6): 40 mg via ORAL
  Filled 2021-05-10 (×6): qty 1

## 2021-05-10 MED FILL — Dexamethasone Sodium Phosphate Inj 100 MG/10ML: INTRAMUSCULAR | Qty: 1 | Status: AC

## 2021-05-10 NOTE — Telephone Encounter (Signed)
Called patient regarding 03/06 appointment, left a voicemail. ?

## 2021-05-10 NOTE — ED Triage Notes (Signed)
Pt coming from home via EMS with c/o SOB and dizziness, especially with movement x 2-3 days. Was seen here Monday for dizziness and nausea. Pt has lung CA, last received chemo and radiation Monday. ?

## 2021-05-10 NOTE — ED Provider Notes (Signed)
[facility] DEPT Provider Note   CSN: [id number] Arrival date & time: 05/10/21  4562     History  Chief Complaint  Patient presents with   Shortness of Breath   Dizziness    Brandon Rubio. is a 71 y.o. male.  71 year old male with prior medical history as detailed below presents for evaluation.  Patient is currently known to Dr. Earlie Server and is being treated for lung cancer.  Patient reports that over the last week to week and a half he had significant weakness and fatigue.  He reports significant dizziness especially with standing.  He reports decreased p.o. intake.  He denies fever.  He reports exertional dyspnea and exertional weakness.  Symptoms are improved with rest.  Over the last 2 weeks where he has had 2 episodes where he recieved IV fluids  The history is provided by the patient, medical records and a relative.  Shortness of Breath Severity:  Mild Onset quality:  Gradual Duration:  1 week Timing:  Intermittent Progression:  Waxing and waning Chronicity:  New Context: activity   Dizziness Associated symptoms: shortness of breath       Home Medications Prior to Admission medications   Medication Sig Start Date End Date Taking? Authorizing Provider  aspirin EC 81 MG tablet Take 81 mg by mouth in the morning. Swallow whole.    [provider]  blood glucose meter kit and supplies KIT Dispense based on patient and insurance preference. Use up to four times daily as directed. 03/09/21   Antonieta Pert, MD  empagliflozin (JARDIANCE) 25 MG TABS tablet Take 1 tablet (25 mg total) by mouth daily. 03/19/21   [name], MD  folic acid (FOLVITE) 1 MG tablet Take 1 tablet (1 mg total) by mouth daily. 01/16/21   Curt Bears, MD  glucose blood (FREESTYLE LITE) test strip USE TO TEST BLOOD SUGAR TWICE DAILY ICD E11.49 01/18/14   Hendricks Limes, MD  HYDROcodone-acetaminophen (NORCO) 5-325 MG tablet Take 1 tablet by  mouth every 6 (six) hours as needed for moderate pain. Patient not taking: Reported on 04/29/2021 03/18/21   Heilingoetter, [name], PA-C  insulin glargine (LANTUS) 100 UNIT/ML Solostar Pen Inject 15 Units into the skin daily. 03/09/21 05/08/21  Antonieta Pert, MD  Melatonin 5 MG CHEW Chew 5 mg by mouth at bedtime.    [provider]  metFORMIN (GLUCOPHAGE) 1000 MG tablet TAKE 1 TABLET BY MOUTH TWICE DAILY WITH A MEAL 09/14/20   [name], MD  metoprolol succinate (TOPROL-XL) 50 MG 24 hr tablet Take 1 tablet (50 mg total) by mouth daily. Take with or immediately following a meal. 01/18/21   [name], MD  Multiple Vitamin (MULTIVITAMIN WITH MINERALS) TABS tablet Take 1 tablet by mouth daily. Centrum Silver    [provider]  nicotine (NICODERM CQ - DOSED IN MG/24 HOURS) 21 mg/24hr patch Place 1 patch (21 mg total) onto the skin daily. Patient not taking: Reported on 03/26/2021 12/28/20   [name], MD  omeprazole (PRILOSEC) 20 MG capsule TAKE 1 CAPSULE(20 MG) BY MOUTH TWICE DAILY BEFORE A MEAL 10/02/20   [name], MD  prochlorperazine (COMPAZINE) 10 MG tablet Take 1 tablet (10 mg total) by mouth every 6 (six) hours as needed for nausea or vomiting. 02/21/21   Curt Bears, MD  simvastatin (ZOCOR) 40 MG tablet Take 1 tablet (40 mg total) by mouth daily at 6 PM. 06/26/20   [name],  MD  traZODone (DESYREL) 50 MG tablet Take 1-2 tablets (50-100 mg total) by mouth at bedtime as needed for sleep. Patient taking differently: Take 50-100 mg by mouth at bedtime as needed for sleep. Pt states taking two tablets 12/28/20   [name], MD  triamcinolone cream (KENALOG) 0.1 % Apply 1 application topically 2 (two) times daily as needed (poison ivy). 12/10/20   Collene Gobble, MD      Allergies    Penicillins    Review of Systems   Review of Systems  Respiratory:  Positive for shortness of breath.   Neurological:  Positive  for dizziness.  All other systems reviewed and are negative.  Physical Exam Updated Vital Signs BP 102/68    Pulse (!) 109    Temp (!) 97.5 F (36.4 C) (Oral)    Resp 18    Ht 6' (1.829 m)    Wt 91.2 kg    SpO2 97%    BMI 27.26 kg/m  Physical Exam Vitals and nursing note reviewed.  Constitutional:      General: He is not in acute distress.    Appearance: Normal appearance. He is well-developed.  HENT:     Head: Normocephalic and atraumatic.     Mouth/Throat:     Comments: Dry mucous membranes Eyes:     Conjunctiva/sclera: Conjunctivae normal.     Pupils: Pupils are equal, round, and reactive to light.  Cardiovascular:     Rate and Rhythm: Regular rhythm. Tachycardia present.     Heart sounds: Normal heart sounds.  Pulmonary:     Effort: Pulmonary effort is normal. No respiratory distress.     Breath sounds: Normal breath sounds.  Abdominal:     General: There is no distension.     Palpations: Abdomen is soft.     Tenderness: There is no abdominal tenderness.  Musculoskeletal:        General: No deformity. Normal range of motion.     Cervical back: Normal range of motion and neck supple.  Skin:    General: Skin is warm and dry.  Neurological:     General: No focal deficit present.     Mental Status: He is alert and oriented to person, place, and time.    ED Results / Procedures / Treatments   Labs (all labs ordered are listed, but only abnormal results are displayed) Labs Reviewed  URINALYSIS, ROUTINE W REFLEX MICROSCOPIC - Abnormal; Notable for the following components:      Result Value   Glucose, UA 150 (*)    Ketones, ur 80 (*)    Protein, ur 30 (*)    All other components within normal limits  CBC WITH DIFFERENTIAL/PLATELET - Abnormal; Notable for the following components:   WBC 2.5 (*)    RBC 4.04 (*)    HCT 38.3 (*)    Platelets 99 (*)    Neutro Abs 1.5 (*)    Lymphs Abs 0.5 (*)    All other components within normal limits  COMPREHENSIVE METABOLIC PANEL  - Abnormal; Notable for the following components:   Sodium 132 (*)    CO2 13 (*)    Glucose, Bld 135 (*)    Anion gap 16 (*)    All other components within normal limits  RESP PANEL BY RT-PCR (FLU A&B, COVID) [id number]  CULTURE, BLOOD (ROUTINE X 2)  CULTURE, BLOOD (ROUTINE X 2)  LACTIC ACID, PLASMA  LACTIC ACID, PLASMA  PROTIME-INR  TROPONIN I (HIGH SENSITIVITY)  TROPONIN I (HIGH SENSITIVITY)    EKG EKG Interpretation  Date/Time:  Friday May 10 2021 10:10:01 EST Ventricular Rate:  127 PR Interval:  139 QRS Duration: 89 QT Interval:  289 QTC Calculation: 420 R Axis:   116 Text Interpretation: Right and left arm electrode reversal, interpretation assumes no reversal Sinus tachycardia Probable lateral infarct, age indeterminate Confirmed by Dene Gentry [phone]) on 05/10/2021 10:27:57 AM  Radiology CT Head Wo Contrast  Result Date: 05/10/2021 CLINICAL DATA:  Headache EXAM: CT HEAD WITHOUT CONTRAST TECHNIQUE: Contiguous axial images were obtained from the base of the skull through the vertex without intravenous contrast. RADIATION DOSE REDUCTION: This exam was performed according to the departmental dose-optimization program which includes automated exposure control, adjustment of the mA and/or kV according to patient size and/or use of iterative reconstruction technique. COMPARISON:  MRI brain 01/01/2021 FINDINGS: Brain: No acute intracranial hemorrhage, mass effect, or herniation. No extra-axial fluid collections. No evidence of acute territorial infarct. No hydrocephalus. Moderate cortical volume loss. Mild patchy hypodensities in the periventricular and subcortical white matter, likely secondary to chronic microvascular ischemic changes. Vascular: No hyperdense vessel or unexpected calcification. Skull: Normal. Negative for fracture or focal lesion. Sinuses/Orbits: No acute finding. Other: None. IMPRESSION: Chronic changes with no acute intracranial process identified. Electronically  Signed   By: Ofilia Neas M.D.   On: 05/10/2021 12:58   CT Angio Chest PE W and/or Wo Contrast  Result Date: 05/10/2021 CLINICAL DATA:  c/o SOB and dizziness, especially with movement x 2-3 days. Was seen here Monday for dizziness and nausea. Pt has lung CA, last received chemo and radiation Monday. EXAM: CT ANGIOGRAPHY CHEST WITH CONTRAST TECHNIQUE: Multidetector CT imaging of the chest was performed using the standard protocol during bolus administration of intravenous contrast. Multiplanar CT image reconstructions and MIPs were obtained to evaluate the vascular anatomy. RADIATION DOSE REDUCTION: This exam was performed according to the departmental dose-optimization program which includes automated exposure control, adjustment of the mA and/or kV according to patient size and/or use of iterative reconstruction technique. CONTRAST:  18m OMNIPAQUE IOHEXOL 350 MG/ML SOLN COMPARISON:  Chest CT, 03/07/2021. FINDINGS: Cardiovascular: Pulmonary arteries are well opacified. There is no evidence of a pulmonary embolism. Heart is normal in size and configuration. No pericardial effusion. Great vessels are normal in caliber. Mild aortic atherosclerosis. Mediastinum/Nodes: Normal thyroid. No neck base, mediastinal or hilar masses or enlarged lymph nodes. Trachea and esophagus are unremarkable. Lungs/Pleura: Pleural based ill-defined right lower lobe mass measures approximately 5.9 x 3.9 x 5 cm, decreased in size from the prior exam. Ground-glass type opacities and linear opacities lie along the borders of the mass, and opacity and small cystic spaces extend superior to the mass, mildly decreased from the prior CT. No new lung masses. No suspicious nodules. No other areas of consolidation. Stable emphysema. No pleural effusion or pneumothorax. Upper Abdomen: No acute abnormality. Musculoskeletal: No fracture or acute finding.  No bone lesion. Review of the MIP images confirms the above findings. IMPRESSION: 1. No  evidence of a pulmonary embolism. No acute findings in the chest. 2. Right lower lobe mass and adjacent opacities have decreased in size from the prior CT consistent with a positive response to therapy for non-small cell lung carcinoma. Aortic Atherosclerosis (ICD10-I70.0) and Emphysema (ICD10-J43.9). Electronically Signed   By: DLajean ManesM.D.   On: 05/10/2021 13:25   CT ABDOMEN PELVIS W CONTRAST  Result Date: 05/10/2021 CLINICAL DATA:  Abdominal pain, nonlocalized EXAM: CT ABDOMEN AND PELVIS WITH CONTRAST  TECHNIQUE: Multidetector CT imaging of the abdomen and pelvis was performed using the standard protocol following bolus administration of intravenous contrast. RADIATION DOSE REDUCTION: This exam was performed according to the departmental dose-optimization program which includes automated exposure control, adjustment of the mA and/or kV according to patient size and/or use of iterative reconstruction technique. CONTRAST:  13m OMNIPAQUE IOHEXOL 350 MG/ML SOLN COMPARISON:  None. FINDINGS: Lower chest: See separately dictated CT of the chest for discussion of findings above the diaphragm. Hepatobiliary: No focal liver abnormality is seen. No gallstones, gallbladder wall thickening, or biliary dilatation. Pancreas: Unremarkable. No pancreatic ductal dilatation or surrounding inflammatory changes. Spleen: Normal in size without focal abnormality. Adrenals/Urinary Tract: Bilateral adrenal glands are unremarkable. No hydronephrosis or nephrolithiasis. Simple cyst of the upper pole of the left kidney. Normal bladder. Stomach/Bowel: Stomach is within normal limits. Appendix appears normal. No evidence of bowel wall thickening, distention, or inflammatory changes. Vascular/Lymphatic: Aortic atherosclerosis. No enlarged abdominal or pelvic lymph nodes. Reproductive: Prostate calcifications, prostate otherwise unremarkable. Other: Small fat containing right inguinal hernia. No abdominopelvic ascites. Musculoskeletal:  No acute or significant osseous findings. IMPRESSION: 1. No acute findings in the abdomen or pelvis. 2. See separately dictated CT of the chest for discussion of findings above the diaphragm. 3.  Aortic Atherosclerosis (ICD10-I70.0). Electronically Signed   By: LYetta GlassmanM.D.   On: 05/10/2021 13:01   DG Chest Port 1 View  Result Date: 05/10/2021 CLINICAL DATA:  Shortness of breath EXAM: PORTABLE CHEST 1 VIEW COMPARISON:  05/06/2021 FINDINGS: Increased right basilar opacity. Right lung mass and radiation changes are present on prior CT in this region. No pleural effusion or pneumothorax. Stable normal heart size. IMPRESSION: Increased right basilar opacity. May reflect progression of radiation changes or other atelectasis/consolidation. Electronically Signed   By: PMacy MisM.D.   On: 05/10/2021 10:52    Procedures Procedures    Medications Ordered in ED Medications  0.9 %  sodium chloride infusion (has no administration in time range)  sodium chloride 0.9 % bolus 1,000 mL (0 mLs Intravenous Stopped 05/10/21 1200)  sodium chloride 0.9 % bolus 1,000 mL (1,000 mLs Intravenous New Bag/Given 05/10/21 1300)  iohexol (OMNIPAQUE) 350 MG/ML injection 80 mL (80 mLs Intravenous Contrast Given 05/10/21 1228)    ED Course/ Medical Decision Making/ A&P                           Medical Decision Making Amount and/or Complexity of Data Reviewed Labs: ordered. Radiology: ordered.  Risk Prescription drug management.    Medical Screen Complete  This patient presented to the ED with complaint of weakness, shortness of breath, dizziness, lightheadedness.  This complaint involves an extensive number of treatment options. The initial differential diagnosis includes, but is not limited to, metabolic abnormality, dehydration, infection, PE, etc.  This presentation is: Acute, Chronic, Self-Limited, Previously Undiagnosed, Uncertain Prognosis, Complicated, Systemic Symptoms, and Threat to Life/Bodily  Function  Patient with current active treatment of lung cancer presents with complaint of weakness, dizziness, fatigue.  Patient's work-up is without evidence of significant pathology such as PE or obvious pulmonary infiltrate.  Patient did feel improved with 2 L IV fluids.  However, with standing patient's tachycardia returned and he continues to feel weak and lightheaded.  Patient would likely benefit from overnight observation and continued IV fluids.  Hospitalist services were case and will evaluate for admission.  Co morbidities that complicated the patient's evaluation  Cancer, advanced age  Additional history obtained:  Additional history obtained from [facility] External records from outside sources obtained and reviewed including prior ED visits and prior Inpatient records.    Lab Tests:  I ordered and personally interpreted labs.  The pertinent results include: CBC, CMP, COVID, flu, INR, UA, lactic acid, troponin   Imaging Studies ordered:  I ordered imaging studies including CT head, CT chest abdomen pelvis I independently visualized and interpreted obtained imaging which showed NAD I agree with the radiologist interpretation.   Cardiac Monitoring:  The patient was maintained on a cardiac monitor.  I personally viewed and interpreted the cardiac monitor which showed an underlying rhythm of: Sinus tachycardia   Medicines ordered:  I ordered medication including IV fluids for dehydration  Reevaluation of the patient after these medicines showed that the patient: improved    Problem List / ED Course:  Weakness, dehydration   Reevaluation:  After the interventions noted above, I reevaluated the patient and found that they have: improved  Disposition:  After consideration of the diagnostic results and the patients response to treatment, I feel that the patent would benefit from admission.          Final Clinical Impression(s) / ED Diagnoses Final  diagnoses:  Dizziness  Lightheadedness    Rx / DC Orders ED Discharge Orders     None         Valarie Merino, MD 05/10/21 1606

## 2021-05-10 NOTE — H&P (Signed)
History and Physical    Brandon Rubio. MBW:[id number] DOB: 03-05-1951 DOA: 05/10/2021  PCP: [name], MD Patient coming from: home  I have personally briefly reviewed patient's old medical records in [place]  Chief Complaint: Feeling weak ,dizziness, tachycardic  HPI: Brandon Rubio. is a 71 y.o. male with medical history significant of CAD, IDDM2,  Stage IIIA (T4, N0, M0) non-small cell lung cancer getting concurrent chemoradiation, has been feeling weak, dizziness especially when standing up , poor appetite for about a week and a half, he did not get scheduled chemo on 2/20 due to this, he received multiple hydration at oncology clinic, he was seen in the ED on 2/27 due to dehydration orthostasis, he received hydration in the ED with improvement of orthostatic vital signs was discharged home, he continues to feel weak received hydration at oncology clinic on 2/28, return to ED today due to exertional dyspnea and exertional weakness, there is no fever, he denies acute pain  ED Course: Blood pressure 102/68, pulse (!) 109, temperature (!) 97.5 F (36.4 C), temperature source Oral, resp. rate 18, height 6' (1.829 m), weight 91.2 kg, SpO2 97 %.  Data reviewed: CT head with chronic changes, no acute findings CTA no PE, no acute infiltrate, Right lower lobe mass and adjacent opacities have decreased in size from the prior CT consistent with a positive response to therapy for non-small cell lung carcinoma CT abdomen /pel no acute findings  EKG sinus tachycardia  COVID screening negative UA positive for ketone, glucosuria, proteinuria, no sign of infection WBC 12.5, hemoglobin 13, platelet 99 Sodium 132, chloride 13, blood glucose 135, creatinine 1.02, anion gap 16,  LFT unremarkable Lactic acid within normal limit Troponin unremarkable Blood culture obtained in the ED, in process   Per EDP initially on presentation he was tachycardia in the 130s, after  hydration tachycardia improved at rest, but persistent tachycardia up to 120s upon standing, continue report feeling dizzy, hospitalist called to further manage dehydration, orthostasis, weakness and failure to thrive     Review of Systems: As per HPI otherwise all other systems reviewed and are negative.   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   Peripheral neuropathy    toes    Past Surgical History:  Procedure Laterality Date   BRONCHIAL BIOPSY  12/10/2020   Procedure: BRONCHIAL BIOPSIES;  Surgeon: Collene Gobble, MD;  Location: [facility] ENDOSCOPY;  Service: Pulmonary;;   BRONCHIAL BRUSHINGS  12/10/2020   Procedure: BRONCHIAL BRUSHINGS;  Surgeon: Collene Gobble, MD;  Location: [facility] ENDOSCOPY;  Service: Pulmonary;;   BRONCHIAL NEEDLE ASPIRATION BIOPSY  12/10/2020   Procedure: BRONCHIAL NEEDLE ASPIRATION BIOPSIES;  Surgeon: Collene Gobble, MD;  Location: [facility] ENDOSCOPY;  Service: Pulmonary;;   COLONOSCOPY     COLONOSCOPY W/ POLYPECTOMY  03/10/2009   LARYNGOSCOPY  03/10/1997   VIDEO BRONCHOSCOPY WITH ENDOBRONCHIAL NAVIGATION N/A 12/10/2020   Procedure: ROBOTIC [place];  Surgeon: Collene Gobble, MD;  Location: [facility] ENDOSCOPY;  Service: Pulmonary;  Laterality: N/A;    Social History  reports that he has  been smoking cigarettes. He has a 14.50 pack-year smoking history. He has never used smokeless tobacco. He reports that he does not drink alcohol and does not use drugs.  Allergies  Allergen Reactions   Penicillins Anaphylaxis and Swelling    THE THROAT SWELLS!!!! Because of a history of documented adverse serious  drug reaction;[name] Alert bracelet  is recommended    Family History  Problem Relation Age of Onset   Lung cancer Mother        X 2   Colon polyps Father    Colon cancer Father 38   Coronary artery disease Father    Lung disease Father    Esophageal cancer Neg Hx    Stomach cancer Neg Hx    Rectal cancer Neg Hx     Prior to Admission medications   Medication Sig Start Date End Date Taking? Authorizing Provider  aspirin EC 81 MG tablet Take 81 mg by mouth in the morning. Swallow whole.    [provider]  blood glucose meter kit and supplies KIT Dispense based on patient and insurance preference. Use up to four times daily as directed. 03/09/21   Antonieta Pert, MD  empagliflozin (JARDIANCE) 25 MG TABS tablet Take 1 tablet (25 mg total) by mouth daily. 03/19/21   [name], MD  folic acid (FOLVITE) 1 MG tablet Take 1 tablet (1 mg total) by mouth daily. 01/16/21   Curt Bears, MD  glucose blood (FREESTYLE LITE) test strip USE TO TEST BLOOD SUGAR TWICE DAILY ICD E11.49 01/18/14   Hendricks Limes, MD  HYDROcodone-acetaminophen (NORCO) 5-325 MG tablet Take 1 tablet by mouth every 6 (six) hours as needed for moderate pain. Patient not taking: Reported on 04/29/2021 03/18/21   Heilingoetter, [name], PA-C  insulin glargine (LANTUS) 100 UNIT/ML Solostar Pen Inject 15 Units into the skin daily. 03/09/21 05/08/21  Antonieta Pert, MD  Melatonin 5 MG CHEW Chew 5 mg by mouth at bedtime.    [provider]  metFORMIN (GLUCOPHAGE) 1000 MG tablet TAKE 1 TABLET BY MOUTH TWICE DAILY WITH A MEAL 09/14/20   [name], MD  metoprolol succinate (TOPROL-XL) 50 MG 24 hr tablet Take 1 tablet (50 mg total) by mouth daily. Take with or immediately following a meal. 01/18/21   [name], MD  Multiple Vitamin (MULTIVITAMIN WITH MINERALS) TABS tablet Take 1 tablet by mouth daily. Centrum Silver    [provider]  nicotine (NICODERM CQ - DOSED IN MG/24 HOURS)  21 mg/24hr patch Place 1 patch (21 mg total) onto the skin daily. Patient not taking: Reported on 03/26/2021 12/28/20   [name], MD  omeprazole (PRILOSEC) 20 MG capsule TAKE 1 CAPSULE(20 MG) BY MOUTH TWICE DAILY BEFORE A MEAL 10/02/20   [name], MD  prochlorperazine (COMPAZINE) 10 MG tablet Take 1 tablet (10 mg total) by mouth every 6 (six) hours as needed for nausea or vomiting. 02/21/21   Curt Bears, MD  simvastatin (ZOCOR) 40 MG tablet Take 1 tablet (40 mg total) by mouth daily at 6 PM. 06/26/20   [name], MD  traZODone (DESYREL) 50 MG tablet Take 1-2 tablets (50-100 mg total) by mouth at bedtime as needed for sleep. Patient taking differently: Take 50-100 mg by mouth at bedtime as needed for sleep. Pt states taking two tablets 12/28/20   [name], MD  triamcinolone cream (KENALOG) 0.1 % Apply 1 application topically 2 (two) times daily as needed (poison ivy). 12/10/20  Collene Gobble, MD    Physical Exam: Vitals:   05/10/21 1215 05/10/21 1353 05/10/21 1415 05/10/21 1445  BP: (!) 98/59 120/66 100/67 102/68  Pulse: (!) 111 (!) 104 (!) 109 (!) 109  Resp: (!) 25 (!) _0 Temp:      TempSrc:      SpO2: 98% 100% 97% 97%  Weight:      Height:        Constitutional: NAD, calm, comfortable Eyes: PERRL, lids and conjunctivae normal ENMT: Mucous membranes are moist.  Respiratory: clear to auscultation bilaterally, no wheezing, no crackles. Normal respiratory effort. No accessory muscle use.  Cardiovascular: sinus tachycardia,  No extremity edema. 2+ pedal pulses. No carotid bruits.  Abdomen: no tenderness, not distended, Bowel sounds positive.  Musculoskeletal: no clubbing / cyanosis. No joint deformity upper and lower extremities. Good ROM, no contractures. Normal muscle tone.  Skin: no rashes, lesions, ulcers. No induration Neurologic: CN 2-12 grossly intact. Sensation intact, Strength 5/5 in all 4.  Psychiatric: Normal  judgment and insight. Alert and oriented x 3. Normal mood.    Labs on Admission: I have personally reviewed following labs and imaging studies  CBC: Recent Labs  Lab 05/06/21 1055 05/10/21 1016  WBC 3.5* 2.5*  NEUTROABS 2.2 1.5*  HGB 12.5* 13.1  HCT 36.2* 38.3*  MCV 92.6 94.8  PLT 62* 99*    Basic Metabolic Panel: Recent Labs  Lab 05/06/21 1055 05/10/21 1016 05/10/21 1216  NA 135 132* 131*  K 4.1 4.1 3.7  CL 102 103 108  CO2 19* 13* 10*  GLUCOSE 120* 135* 103*  BUN _1 CREATININE 0.94 1.02 0.74  CALCIUM 9.4 9.0 7.6*    GFR: Estimated Creatinine Clearance: 90.3 mL/min (by C-G formula based on SCr of 0.74 mg/dL).  Liver Function Tests: Recent Labs  Lab 05/06/21 1055 05/10/21 1016  AST 17 17  ALT 12 14  ALKPHOS 55 64  BILITOT 0.7 0.8  PROT 7.7 8.0  ALBUMIN 4.1 4.1    Urine analysis:    Component Value Date/Time   COLORURINE YELLOW 05/10/2021 1016   APPEARANCEUR CLEAR 05/10/2021 1016   LABSPEC 1.025 05/10/2021 1016   PHURINE 5.0 05/10/2021 1016   GLUCOSEU 150 (A) 05/10/2021 1016   HGBUR NEGATIVE 05/10/2021 1016   BILIRUBINUR NEGATIVE 05/10/2021 1016   KETONESUR 80 (A) 05/10/2021 1016   PROTEINUR 30 (A) 05/10/2021 1016   UROBILINOGEN 0.2 12/11/2007 2313   NITRITE NEGATIVE 05/10/2021 1016   LEUKOCYTESUR NEGATIVE 05/10/2021 1016    Radiological Exams on Admission: CT Head Wo Contrast  Result Date: 05/10/2021 CLINICAL DATA:  Headache EXAM: CT HEAD WITHOUT CONTRAST TECHNIQUE: Contiguous axial images were obtained from the base of the skull through the vertex without intravenous contrast. RADIATION DOSE REDUCTION: This exam was performed according to the departmental dose-optimization program which includes automated exposure control, adjustment of the mA and/or kV according to patient size and/or use of iterative reconstruction technique. COMPARISON:  MRI brain 01/01/2021 FINDINGS: Brain: No acute intracranial hemorrhage, mass effect, or herniation.  No extra-axial fluid collections. No evidence of acute territorial infarct. No hydrocephalus. Moderate cortical volume loss. Mild patchy hypodensities in the periventricular and subcortical white matter, likely secondary to chronic microvascular ischemic changes. Vascular: No hyperdense vessel or unexpected calcification. Skull: Normal. Negative for fracture or focal lesion. Sinuses/Orbits: No acute finding. Other: None. IMPRESSION: Chronic changes with no acute intracranial process identified. Electronically Signed   By: Ofilia Neas M.D.   On: 05/10/2021  12:58   CT Angio Chest PE W and/or Wo Contrast  Result Date: 05/10/2021 CLINICAL DATA:  c/o SOB and dizziness, especially with movement x 2-3 days. Was seen here Monday for dizziness and nausea. Pt has lung CA, last received chemo and radiation Monday. EXAM: CT ANGIOGRAPHY CHEST WITH CONTRAST TECHNIQUE: Multidetector CT imaging of the chest was performed using the standard protocol during bolus administration of intravenous contrast. Multiplanar CT image reconstructions and MIPs were obtained to evaluate the vascular anatomy. RADIATION DOSE REDUCTION: This exam was performed according to the departmental dose-optimization program which includes automated exposure control, adjustment of the mA and/or kV according to patient size and/or use of iterative reconstruction technique. CONTRAST:  41m OMNIPAQUE IOHEXOL 350 MG/ML SOLN COMPARISON:  Chest CT, 03/07/2021. FINDINGS: Cardiovascular: Pulmonary arteries are well opacified. There is no evidence of a pulmonary embolism. Heart is normal in size and configuration. No pericardial effusion. Great vessels are normal in caliber. Mild aortic atherosclerosis. Mediastinum/Nodes: Normal thyroid. No neck base, mediastinal or hilar masses or enlarged lymph nodes. Trachea and esophagus are unremarkable. Lungs/Pleura: Pleural based ill-defined right lower lobe mass measures approximately 5.9 x 3.9 x 5 cm, decreased in  size from the prior exam. Ground-glass type opacities and linear opacities lie along the borders of the mass, and opacity and small cystic spaces extend superior to the mass, mildly decreased from the prior CT. No new lung masses. No suspicious nodules. No other areas of consolidation. Stable emphysema. No pleural effusion or pneumothorax. Upper Abdomen: No acute abnormality. Musculoskeletal: No fracture or acute finding.  No bone lesion. Review of the MIP images confirms the above findings. IMPRESSION: 1. No evidence of a pulmonary embolism. No acute findings in the chest. 2. Right lower lobe mass and adjacent opacities have decreased in size from the prior CT consistent with a positive response to therapy for non-small cell lung carcinoma. Aortic Atherosclerosis (ICD10-I70.0) and Emphysema (ICD10-J43.9). Electronically Signed   By: DLajean ManesM.D.   On: 05/10/2021 13:25   CT ABDOMEN PELVIS W CONTRAST  Result Date: 05/10/2021 CLINICAL DATA:  Abdominal pain, nonlocalized EXAM: CT ABDOMEN AND PELVIS WITH CONTRAST TECHNIQUE: Multidetector CT imaging of the abdomen and pelvis was performed using the standard protocol following bolus administration of intravenous contrast. RADIATION DOSE REDUCTION: This exam was performed according to the departmental dose-optimization program which includes automated exposure control, adjustment of the mA and/or kV according to patient size and/or use of iterative reconstruction technique. CONTRAST:  814mOMNIPAQUE IOHEXOL 350 MG/ML SOLN COMPARISON:  None. FINDINGS: Lower chest: See separately dictated CT of the chest for discussion of findings above the diaphragm. Hepatobiliary: No focal liver abnormality is seen. No gallstones, gallbladder wall thickening, or biliary dilatation. Pancreas: Unremarkable. No pancreatic ductal dilatation or surrounding inflammatory changes. Spleen: Normal in size without focal abnormality. Adrenals/Urinary Tract: Bilateral adrenal glands are  unremarkable. No hydronephrosis or nephrolithiasis. Simple cyst of the upper pole of the left kidney. Normal bladder. Stomach/Bowel: Stomach is within normal limits. Appendix appears normal. No evidence of bowel wall thickening, distention, or inflammatory changes. Vascular/Lymphatic: Aortic atherosclerosis. No enlarged abdominal or pelvic lymph nodes. Reproductive: Prostate calcifications, prostate otherwise unremarkable. Other: Small fat containing right inguinal hernia. No abdominopelvic ascites. Musculoskeletal: No acute or significant osseous findings. IMPRESSION: 1. No acute findings in the abdomen or pelvis. 2. See separately dictated CT of the chest for discussion of findings above the diaphragm. 3.  Aortic Atherosclerosis (ICD10-I70.0). Electronically Signed   By: LeYetta Glassman.D.   On:  05/10/2021 13:01   DG Chest Port 1 View  Result Date: 05/10/2021 CLINICAL DATA:  Shortness of breath EXAM: PORTABLE CHEST 1 VIEW COMPARISON:  05/06/2021 FINDINGS: Increased right basilar opacity. Right lung mass and radiation changes are present on prior CT in this region. No pleural effusion or pneumothorax. Stable normal heart size. IMPRESSION: Increased right basilar opacity. May reflect progression of radiation changes or other atelectasis/consolidation. Electronically Signed   By: Macy Mis M.D.   On: 05/10/2021 10:52      Assessment/Plan Principal Problem:   Dehydration Active Problems:   Insulin dependent type 2 diabetes mellitus (HCC)   Hyponatremia   Orthostasis   FTT (failure to thrive) in adult    Dehydration/orthostasis/hyponatremia -Discontinue Jardiance, recommend do not restart at discharge -Check a.m. cortisol level -Continue hydration,  -expect improvement in the morning and possible discharge tomorrow  Possible euglycemia DKA in the setting of Jardiance use and dehydration -Continue hydration, discontinue Jardiance -Repeat BMP this evening, if no improvement may need  short-term insulin drip  Addendum: repeat bmp with improvement in anion gap, will continue hydration with LR  Insulin-dependent type 2 diabetes -Reported hypoglycemia episode at home blood glucose in the 70s with poor oral intake recently -check A1c -Continue sliding scale insulin -Hold metformin, hold Lantus -Recommend discontinue Jardiance  History of CAD No chest pain, troponin negative Continue aspirin and statin  Pancytopenia in the setting of malignancy -Repeat CBC   Stage IIIa non-small cell lung cancer -Getting concurrent chemoradiation, last radiation on 2/28, last chemo on 2/13 with paclitaxel and carboplatin Schedule chemo on 2/20, held due to thrombocytopenia and feeling weak/dizzy/dehydration  FTT, will request PT eval  DVT prophylaxis: scd's   Code Status:   Full Family Communication:    Patient is from: Home   Anticipated DC to: Home   Anticipated DC date:  Possible tomorrow, may need home health   Consults called:  None Admission status:  Observation  Severity of Illness:  Voice Recognition /Dragon dictation system was used to create this note, attempts have been made to correct errors. Please contact the author with questions and/or clarifications.  Florencia Reasons MD PhD FACP [facility]  How to contact the [facility] Attending or Consulting provider [place] or covering provider during after hours [place], for this patient?   Check the care team in [facility] and look for a) attending/consulting [facility] provider listed and b) the [facility] team listed Log into [email or web address] and use [place]'s universal password to access. If you do not have the password, please contact the hospital operator. Locate the [facility] provider you are looking for under [facility] and page to a number that you can be directly reached. If you still have difficulty reaching the provider, please page the [facility] (Director on Call) for the Hospitalists listed on amion for assistance.  05/10/2021, 6:43  PM

## 2021-05-11 ENCOUNTER — Observation Stay (HOSPITAL_COMMUNITY): Payer: Medicare Other

## 2021-05-11 DIAGNOSIS — R7989 Other specified abnormal findings of blood chemistry: Secondary | ICD-10-CM

## 2021-05-11 DIAGNOSIS — I951 Orthostatic hypotension: Secondary | ICD-10-CM | POA: Diagnosis present

## 2021-05-11 DIAGNOSIS — E871 Hypo-osmolality and hyponatremia: Secondary | ICD-10-CM

## 2021-05-11 DIAGNOSIS — I2510 Atherosclerotic heart disease of native coronary artery without angina pectoris: Secondary | ICD-10-CM | POA: Diagnosis present

## 2021-05-11 DIAGNOSIS — R55 Syncope and collapse: Secondary | ICD-10-CM

## 2021-05-11 DIAGNOSIS — E876 Hypokalemia: Secondary | ICD-10-CM | POA: Diagnosis present

## 2021-05-11 DIAGNOSIS — Z880 Allergy status to penicillin: Secondary | ICD-10-CM | POA: Diagnosis not present

## 2021-05-11 DIAGNOSIS — Z20822 Contact with and (suspected) exposure to covid-19: Secondary | ICD-10-CM | POA: Diagnosis present

## 2021-05-11 DIAGNOSIS — E872 Acidosis, unspecified: Secondary | ICD-10-CM | POA: Diagnosis not present

## 2021-05-11 DIAGNOSIS — E2740 Unspecified adrenocortical insufficiency: Secondary | ICD-10-CM | POA: Diagnosis present

## 2021-05-11 DIAGNOSIS — Z794 Long term (current) use of insulin: Secondary | ICD-10-CM | POA: Diagnosis not present

## 2021-05-11 DIAGNOSIS — R0602 Shortness of breath: Secondary | ICD-10-CM | POA: Diagnosis present

## 2021-05-11 DIAGNOSIS — Z79899 Other long term (current) drug therapy: Secondary | ICD-10-CM | POA: Diagnosis not present

## 2021-05-11 DIAGNOSIS — T451X5A Adverse effect of antineoplastic and immunosuppressive drugs, initial encounter: Secondary | ICD-10-CM | POA: Diagnosis present

## 2021-05-11 DIAGNOSIS — R627 Adult failure to thrive: Secondary | ICD-10-CM

## 2021-05-11 DIAGNOSIS — E1143 Type 2 diabetes mellitus with diabetic autonomic (poly)neuropathy: Secondary | ICD-10-CM | POA: Diagnosis present

## 2021-05-11 DIAGNOSIS — Z7982 Long term (current) use of aspirin: Secondary | ICD-10-CM | POA: Diagnosis not present

## 2021-05-11 DIAGNOSIS — E1142 Type 2 diabetes mellitus with diabetic polyneuropathy: Secondary | ICD-10-CM | POA: Diagnosis present

## 2021-05-11 DIAGNOSIS — R0609 Other forms of dyspnea: Secondary | ICD-10-CM | POA: Diagnosis not present

## 2021-05-11 DIAGNOSIS — D61818 Other pancytopenia: Secondary | ICD-10-CM

## 2021-05-11 DIAGNOSIS — I10 Essential (primary) hypertension: Secondary | ICD-10-CM | POA: Diagnosis present

## 2021-05-11 DIAGNOSIS — Z7984 Long term (current) use of oral hypoglycemic drugs: Secondary | ICD-10-CM | POA: Diagnosis not present

## 2021-05-11 DIAGNOSIS — R5381 Other malaise: Secondary | ICD-10-CM

## 2021-05-11 DIAGNOSIS — C3431 Malignant neoplasm of lower lobe, right bronchus or lung: Secondary | ICD-10-CM | POA: Diagnosis present

## 2021-05-11 DIAGNOSIS — Z86718 Personal history of other venous thrombosis and embolism: Secondary | ICD-10-CM | POA: Diagnosis not present

## 2021-05-11 DIAGNOSIS — C3491 Malignant neoplasm of unspecified part of right bronchus or lung: Secondary | ICD-10-CM | POA: Diagnosis not present

## 2021-05-11 DIAGNOSIS — E222 Syndrome of inappropriate secretion of antidiuretic hormone: Secondary | ICD-10-CM | POA: Diagnosis present

## 2021-05-11 DIAGNOSIS — E785 Hyperlipidemia, unspecified: Secondary | ICD-10-CM | POA: Diagnosis present

## 2021-05-11 DIAGNOSIS — E878 Other disorders of electrolyte and fluid balance, not elsewhere classified: Secondary | ICD-10-CM | POA: Diagnosis not present

## 2021-05-11 DIAGNOSIS — D61810 Antineoplastic chemotherapy induced pancytopenia: Secondary | ICD-10-CM | POA: Diagnosis present

## 2021-05-11 DIAGNOSIS — E1110 Type 2 diabetes mellitus with ketoacidosis without coma: Secondary | ICD-10-CM | POA: Diagnosis present

## 2021-05-11 DIAGNOSIS — E119 Type 2 diabetes mellitus without complications: Secondary | ICD-10-CM | POA: Diagnosis not present

## 2021-05-11 DIAGNOSIS — E860 Dehydration: Secondary | ICD-10-CM | POA: Diagnosis present

## 2021-05-11 LAB — CBC WITH DIFFERENTIAL/PLATELET
Abs Immature Granulocytes: 0.01 10*3/uL (ref 0.00–0.07)
Basophils Absolute: 0 10*3/uL (ref 0.0–0.1)
Basophils Relative: 1 %
Eosinophils Absolute: 0.1 10*3/uL (ref 0.0–0.5)
Eosinophils Relative: 6 %
HCT: 29.7 % — ABNORMAL LOW (ref 39.0–52.0)
Hemoglobin: 10 g/dL — ABNORMAL LOW (ref 13.0–17.0)
Immature Granulocytes: 1 %
Lymphocytes Relative: 22 %
Lymphs Abs: 0.4 10*3/uL — ABNORMAL LOW (ref 0.7–4.0)
MCH: 32.2 pg (ref 26.0–34.0)
MCHC: 33.7 g/dL (ref 30.0–36.0)
MCV: 95.5 fL (ref 80.0–100.0)
Monocytes Absolute: 0.2 10*3/uL (ref 0.1–1.0)
Monocytes Relative: 13 %
Neutro Abs: 1 10*3/uL — ABNORMAL LOW (ref 1.7–7.7)
Neutrophils Relative %: 57 %
Platelets: 90 10*3/uL — ABNORMAL LOW (ref 150–400)
RBC: 3.11 MIL/uL — ABNORMAL LOW (ref 4.22–5.81)
RDW: 15.3 % (ref 11.5–15.5)
WBC: 1.8 10*3/uL — ABNORMAL LOW (ref 4.0–10.5)
nRBC: 0 % (ref 0.0–0.2)

## 2021-05-11 LAB — ECHOCARDIOGRAM COMPLETE
AR max vel: 3.17 cm2
AV Peak grad: 5.2 mmHg
Ao pk vel: 1.14 m/s
Area-P 1/2: 5.46 cm2
Height: 72 in
S' Lateral: 3.8 cm
Weight: 3216 oz

## 2021-05-11 LAB — BASIC METABOLIC PANEL
Anion gap: 10 (ref 5–15)
BUN: 8 mg/dL (ref 8–23)
CO2: 13 mmol/L — ABNORMAL LOW (ref 22–32)
Calcium: 8.2 mg/dL — ABNORMAL LOW (ref 8.9–10.3)
Chloride: 107 mmol/L (ref 98–111)
Creatinine, Ser: 0.73 mg/dL (ref 0.61–1.24)
GFR, Estimated: >60 mL/min (ref >60)
Glucose, Bld: 91 mg/dL (ref 70–99)
Potassium: 3.6 mmol/L (ref 3.5–5.1)
Sodium: 130 mmol/L — ABNORMAL LOW (ref 135–145)

## 2021-05-11 LAB — MAGNESIUM: Magnesium: 1.5 mg/dL — ABNORMAL LOW (ref 1.7–2.4)

## 2021-05-11 LAB — CORTISOL: Cortisol, Plasma: 1.1 ug/dL

## 2021-05-11 LAB — HEMOGLOBIN A1C
Hgb A1c MFr Bld: 5.9 % — ABNORMAL HIGH (ref 4.8–5.6)
Mean Plasma Glucose: 122.63 mg/dL

## 2021-05-11 LAB — OSMOLALITY, URINE: Osmolality, Ur: 542 mOsm/kg (ref 300–900)

## 2021-05-11 LAB — SODIUM, URINE, RANDOM: Sodium, Ur: 71 mmol/L

## 2021-05-11 LAB — TSH: TSH: 1.298 u[IU]/mL (ref 0.350–4.500)

## 2021-05-11 MED ORDER — MIDODRINE HCL 5 MG PO TABS
5.0000 mg | ORAL_TABLET | Freq: Three times a day (TID) | ORAL | Status: DC
  Administered 2021-05-11 – 2021-05-12 (×4): 5 mg via ORAL
  Filled 2021-05-11 (×5): qty 1

## 2021-05-11 MED ORDER — MAGNESIUM SULFATE 2 GM/50ML IV SOLN
2.0000 g | Freq: Once | INTRAVENOUS | Status: AC
  Administered 2021-05-11: 2 g via INTRAVENOUS
  Filled 2021-05-11: qty 50

## 2021-05-11 MED ORDER — METOPROLOL TARTRATE 25 MG PO TABS
12.5000 mg | ORAL_TABLET | Freq: Two times a day (BID) | ORAL | Status: DC
  Filled 2021-05-11 (×2): qty 1

## 2021-05-11 MED ORDER — COSYNTROPIN 0.25 MG IJ SOLR
0.2500 mg | Freq: Once | INTRAMUSCULAR | Status: AC
  Administered 2021-05-12: 0.25 mg via INTRAVENOUS
  Filled 2021-05-11: qty 0.25

## 2021-05-11 MED ORDER — POTASSIUM CHLORIDE IN NACL 20-0.9 MEQ/L-% IV SOLN
INTRAVENOUS | Status: DC
  Filled 2021-05-11 (×6): qty 1000

## 2021-05-11 MED ORDER — SODIUM BICARBONATE 650 MG PO TABS
650.0000 mg | ORAL_TABLET | Freq: Three times a day (TID) | ORAL | Status: DC
  Administered 2021-05-11 – 2021-05-16 (×16): 650 mg via ORAL
  Filled 2021-05-11 (×16): qty 1

## 2021-05-11 MED ORDER — LIP MEDEX EX OINT
TOPICAL_OINTMENT | CUTANEOUS | Status: DC | PRN
  Administered 2021-05-11: 75 via TOPICAL
  Filled 2021-05-11: qty 7

## 2021-05-11 NOTE — Assessment & Plan Note (Addendum)
Denies history of stent or CABG.  No chest pain.  TTE reassuring. ?-Continue low-dose metoprolol ?

## 2021-05-11 NOTE — Assessment & Plan Note (Signed)
On chemoradiation.  ?-Per oncology. ?

## 2021-05-11 NOTE — Progress Notes (Signed)
?   05/11/21 0827  ?Assess: MEWS Score  ?Temp (!) 97.5 ?F (36.4 ?C)  ?BP 110/60  ?Pulse Rate (!) 108  ?Resp (!) 24  ?Level of Consciousness Alert  ?SpO2 98 %  ?O2 Device Nasal Cannula  ?O2 Flow Rate (L/min) 1 L/min  ?Assess: MEWS Score  ?MEWS Temp 0  ?MEWS Systolic 0  ?MEWS Pulse 1  ?MEWS RR 1  ?MEWS LOC 0  ?MEWS Score 2  ?MEWS Score Color Yellow  ?Assess: if the MEWS score is Yellow or Red  ?Were vital signs taken at a resting state? Yes  ?Focused Assessment Change from prior assessment (see assessment flowsheet)  ?Does the patient meet 2 or more of the SIRS criteria? Yes  ?Does the patient have a confirmed or suspected source of infection? No  ?MEWS guidelines implemented *See Row Information* Yes  ?Treat  ?MEWS Interventions Escalated (See documentation below)  ?Pain Scale 0-10  ?Pain Score 9  ?Pain Location Neck  ?Pain Descriptors / Indicators Constant  ?Pain Frequency Constant  ?Pain Onset Gradual  ?Patients Stated Pain Goal 0  ?Take Vital Signs  ?Increase Vital Sign Frequency  Yellow: Q 2hr X 2 then Q 4hr X 2, if remains yellow, continue Q 4hrs  ?Escalate  ?MEWS: Escalate Yellow: discuss with charge nurse/RN and consider discussing with provider and RRT  ?Notify: Provider  ?Provider Name/Title Dr. Cyndia Skeeters  ?Date Provider Notified 05/11/21  ?Time Provider Notified [phone]  ?Notification Type  ?(secure chat)  ?Notification Reason Change in status  ?Provider response Other (Comment) ?(aware)  ?Assess: SIRS CRITERIA  ?SIRS Temperature  0  ?SIRS Pulse 1  ?SIRS Respirations  1  ?SIRS WBC 1  ?SIRS Score Sum  3  ? ? ?

## 2021-05-11 NOTE — Assessment & Plan Note (Addendum)
Concern for adrenal insufficiency.  ACTH low as well.  However, minimal response to cosyntropin. ?-P.o. Cortef as above. ?-Follow MRI brain ?

## 2021-05-11 NOTE — Assessment & Plan Note (Addendum)
BNP and echocardiogram reassuring.  Likely deconditioning ?-PT/OT eval ?

## 2021-05-11 NOTE — Hospital Course (Addendum)
71 year old M with PMH of stage III NSCLC on chemoradiation, CAD, IDDM-2, HTN and thrombocytopenia presenting with ongoing generalized weakness, dizziness, poor p.o. intake and exertional dyspnea for over a week and half, and admitted for dehydration, orthostasis, hyponatremia, concern for euglycemic DKA and failure to thrive.  Previously improved with IV fluid in oncology office in ED and discharged home. ? ?In ED, reportedly tachycardic to 130s but improved to 110s with IV fluid.  Normotensive.  CT head and CT abdomen and pelvis without acute finding.  CTA chest with decreased RLL mass and adjacent opacities.  Labs with mild hyponatremia and euglycemic DKA.  Has mild leukocytosis to 12.5.  Started on IV fluid and admitted. ? ?TTE and carotid US without significant finding.  TSH normal.  A.m. cortisol low with some response to cosyntropin injection.  ACTH level very low.  Remaining orthostatic with positive orthostatic vitals.  Started on midodrine and p.o. Cortef.  MRI brain ordered. ? ?Therapy recommended home health and DME. ?

## 2021-05-11 NOTE — Plan of Care (Signed)

## 2021-05-11 NOTE — Assessment & Plan Note (Deleted)
Mg 1.5. ?-IV magnesium sulfate 2 g x 1 ?

## 2021-05-11 NOTE — Assessment & Plan Note (Signed)
PT/OT

## 2021-05-11 NOTE — Assessment & Plan Note (Addendum)
Likely due to chemotherapy.  Improving. ?-Continue monitoring ?

## 2021-05-11 NOTE — Assessment & Plan Note (Addendum)
K3.1.  Mg 1.5.  P2.3. ?Replenish as appropriate. ?Recheck in the morning. ?

## 2021-05-11 NOTE — Assessment & Plan Note (Addendum)
Controlled.  DKA ruled out.  Acidosis and ketonuria likely from dehydration.  A1c 5.9%.  BMP glucose normal. ?-Initiate CBG monitoring if blood glucose elevated. ?-Hold home insulin, Jardiance and metformin. ?-Agree with discontinuing Jardiance on discharge ?

## 2021-05-11 NOTE — Progress Notes (Signed)
Echocardiogram ?2D Echocardiogram has been performed. ? ?Brandon Rubio ?05/11/2021, 12:04 PM ?

## 2021-05-11 NOTE — Progress Notes (Signed)
OT Cancellation Note ? ?Patient Details ?Name: Brandon Rubio. ?MRN: [id number] ?DOB: 1950-04-19 ? ? ?Cancelled Treatment:    Reason Eval/Treat Not Completed: Other (comment) ?Patient reported fatigue with recent Echo, PT session and dizziness with movement. Patient requesting for therapist to come back 05/12/21. OT to continue to follow and check back as schedule will allow. ?Calli Bashor OTR/L, MS ?Acute Rehabilitation Department ?Office# [phone] ?Pager# [phone] ? ? ?05/11/2021, 12:10 PM ?

## 2021-05-11 NOTE — Progress Notes (Signed)
PROGRESS NOTE  Brandon Rubio. YOV:[id number] DOB: [birth year]   PCP: [name], MD  Patient is from: Home.  Lives alone.  DOA: 05/10/2021 LOS: 0  Chief complaints:  Chief Complaint  Patient presents with   Shortness of Breath   Dizziness     Brief Narrative / Interim history: 71 year old M with PMH of stage III NSCLC on chemoradiation, CAD, IDDM-2, HTN and thrombocytopenia presenting with ongoing generalized weakness, dizziness, poor p.o. intake and exertional dyspnea for over a week and half, and admitted for dehydration, orthostasis, hyponatremia, concern for euglycemic DKA and failure to thrive.  Previously improved with IV fluid in oncology office in ED and discharged home.  In ED, reportedly tachycardic to 130s but improved to 110s with IV fluid.  Normotensive.  CT head and CT abdomen and pelvis without acute finding.  CTA chest with decreased RLL mass and adjacent opacities.  Labs with mild hyponatremia and euglycemic DKA.  Has mild leukocytosis to 12.5.  Started on IV fluid and admitted.  Next day, continues to endorse dizziness that he describes as both spinning and lightheadedness.  Orthostatic vitals positive.  Work-up for orthostatic hypotension initiated   Subjective: Seen and examined earlier this morning.  No major events overnight or this morning.  Continues to endorse dizziness that he describes as spinning and lightheadedness.  Symptoms are worse when he gets up.  He denies chest pain but dyspnea with exertion and generalized weakness.  He denies nausea, vomiting or abdominal pain.  He denies focal weakness.   Objective: Vitals:   05/11/21 0835 05/11/21 0856 05/11/21 0900 05/11/21 1111  BP:  106/67 111/61 105/67  Pulse:  (!) 107  (!) 104  Resp: (!) 22 18  20   Temp:    97.8 F (36.6 C)  TempSrc:    Oral  SpO2:  95%  95%  Weight:      Height:        Examination:  GENERAL: No apparent distress.  Nontoxic. HEENT: MMM.  Vision and hearing grossly  intact.  NECK: Supple.  No apparent JVD.  RESP:  No IWOB.  Fair aeration bilaterally. CVS:  RRR. Heart sounds normal.  ABD/GI/GU: BS+. Abd soft, NTND.  MSK/EXT:  Moves extremities. No apparent deformity. No edema.  SKIN: no apparent skin lesion or wound NEURO: Awake, alert and oriented appropriately. Speech clear. Cranial nerves II-XII  intact.  No nystagmus.  Motor 5/5 in all muscle groups of UE and LE bilaterally, Normal tone. Light sensation intact in all dermatomes of upper and lower ext bilaterally. Patellar reflex symmetric.  No pronator drift.  Finger to nose intact. PSYCH: Calm. Normal affect.   Procedures:  None  Microbiology summarized: [id number] and influenza PCR nonreactive. Blood cultures NGTD  Assessment and Plan: * Orthostatic hypotension Orthostatic vitals positive.  Patient describes his dizziness as spinning and lightheadedness.  Neuro exam reassuring.  He has no focal neurodeficit, nystagmus or dysmetria.  CT head without acute finding.  A.m. cortisol low at 1.1.  Other possibilities include chemo related neurotoxicity or autonomic neuropathy from diabetes.  Patient was also on metoprolol prior to admission. -Continue IV fluid.  Change LR to NS with KCl given hyponatremia -Continue holding metoprolol -Start midodrine 5 mg 3 times daily. -Echocardiogram, carotid US, and ACTH stimulation test in the morning -PT/OT, TED hose, elevate head of bed above 30 degree -Follow blood cultures but low suspicion for infectious process  Low serum cortisol level Could be contributing to patient's symptoms. -ACTH stimulation test  in the morning  Dehydration See orthostatic hypotension.  Metabolic acidosis Initially some concern about euglycemic DKA but remains acidotic despite gap closure.  Likely from IV fluid -Start sodium bicarbonate -Recheck in the morning  FTT (failure to thrive) in adult Nutrition Status:       Consult dietitian. Liberate diet  Dyspnea on  exertion He now appears euvolemic on exam.  -Echocardiogram  Hyponatremia Could be from dehydration.  Cannot exclude adrenal insufficiency or SIADH. -Follow urine chemistry and osmolality -Changed LR to NS-KCl.  Insulin dependent type 2 diabetes mellitus ([place]) Controlled.  DKA ruled out.  Acidosis and ketonuria likely from dehydration.  A1c 5.9%.  BMP glucose within normal.  -Initiate CBG monitoring if blood glucose elevated. -Hold home insulin, Jardiance and metformin. -Agree with holding Jardiance.  Pancytopenia ([place]) Likely due to chemotherapy. -Continue monitoring  Hypomagnesemia Mg 1.5. -IV magnesium sulfate 2 g x 1  Hypokalemia K3.6. -KCl with IV fluid  Adenocarcinoma of right lung, stage 3 (HCC) On chemoradiation.  -Per oncology.  Physical deconditioning PT/OT  CAD (coronary artery disease) Denies history of stent or CABG.  No chest pain. -Check echocardiogram         Body mass index is 27.26 kg/m.         DVT prophylaxis:  Place TED hose Start: 05/11/21 0932 SCDs Start: 05/10/21 1920  Code Status: Full code Family Communication: Patient and/or RN. Available if any question.  Level of care: Telemetry Status is: Observation The patient will require care spanning > 2 midnights and should be moved to inpatient because: Due to orthostatic hypotension requiring IV fluid and further evaluation         Consultants:  None   Sch Meds:  Scheduled Meds:  aspirin EC  81 mg Oral q AM   [START ON 05/12/2021] cosyntropin  0.25 mg Intravenous Once   folic acid  1 mg Oral Daily   midodrine  5 mg Oral TID WC   pantoprazole  40 mg Oral Daily   simvastatin  40 mg Oral q1800   sodium bicarbonate  650 mg Oral TID   Continuous Infusions:  0.9 % NaCl with KCl 20 mEq / L 125 mL/hr at 05/11/21 0826   PRN Meds:.HYDROcodone-acetaminophen, lip balm, melatonin  Antimicrobials: Anti-infectives (From admission, onward)    None        I have  personally reviewed the following labs and images: CBC: Recent Labs  Lab 05/06/21 1055 05/10/21 1016 05/11/21 0527  WBC 3.5* 2.5* 1.8*  NEUTROABS 2.2 1.5* 1.0*  HGB 12.5* 13.1 10.0*  HCT 36.2* 38.3* 29.7*  MCV 92.6 94.8 95.5  PLT 62* 99* 90*   BMP &GFR Recent Labs  Lab 05/06/21 1055 05/10/21 1016 05/10/21 1216 05/11/21 0527  NA 135 132* 131* 130*  K 4.1 4.1 3.7 3.6  CL 102 103 108 107  CO2 19* 13* 10* 13*  GLUCOSE 120* 135* 103* 91  BUN 15 13 11 8   CREATININE 0.94 1.02 0.74 0.73  CALCIUM 9.4 9.0 7.6* 8.2*  MG  --   --   --  1.5*   Estimated Creatinine Clearance: 90.3 mL/min (by C-G formula based on SCr of 0.73 mg/dL). Liver & Pancreas: Recent Labs  Lab 05/06/21 1055 05/10/21 1016  AST 17 17  ALT 12 14  ALKPHOS 55 64  BILITOT 0.7 0.8  PROT 7.7 8.0  ALBUMIN 4.1 4.1   No results for input(s): LIPASE, AMYLASE in the last 168 hours. No results for input(s): AMMONIA in  the last 168 hours. Diabetic: Recent Labs    05/11/21 0527  HGBA1C 5.9*   No results for input(s): GLUCAP in the last 168 hours. Cardiac Enzymes: No results for input(s): CKTOTAL, CKMB, CKMBINDEX, TROPONINI in the last 168 hours. No results for input(s): PROBNP in the last 8760 hours. Coagulation Profile: Recent Labs  Lab 05/10/21 1016  INR 1.1   Thyroid Function Tests: Recent Labs    05/11/21 0527  TSH 1.298   Lipid Profile: No results for input(s): CHOL, HDL, LDLCALC, TRIG, CHOLHDL, LDLDIRECT in the last 72 hours. Anemia Panel: No results for input(s): VITAMINB12, FOLATE, FERRITIN, TIBC, IRON, RETICCTPCT in the last 72 hours. Urine analysis:    Component Value Date/Time   COLORURINE YELLOW 05/10/2021 1016   APPEARANCEUR CLEAR 05/10/2021 1016   LABSPEC 1.025 05/10/2021 1016   PHURINE 5.0 05/10/2021 1016   GLUCOSEU 150 (A) 05/10/2021 1016   HGBUR NEGATIVE 05/10/2021 1016   BILIRUBINUR NEGATIVE 05/10/2021 1016   KETONESUR 80 (A) 05/10/2021 1016   PROTEINUR 30 (A) 05/10/2021  1016   UROBILINOGEN 0.2 12/11/2007 2313   NITRITE NEGATIVE 05/10/2021 1016   LEUKOCYTESUR NEGATIVE 05/10/2021 1016   Sepsis Labs: Invalid input(s): PROCALCITONIN, [place]  Microbiology: Recent Results (from the past 240 hour(s))  Resp Panel by RT-PCR (Flu A&B, Covid) Nasopharyngeal Swab     Status: None   Collection Time: 05/06/21 12:26 PM   Specimen: Nasopharyngeal Swab; Nasopharyngeal(NP) swabs in vial transport medium  Result Value Ref Range Status   SARS Coronavirus 2 by RT PCR NEGATIVE NEGATIVE Final    Comment: (NOTE) SARS-CoV-2 target nucleic acids are NOT DETECTED.  The SARS-CoV-2 RNA is generally detectable in upper respiratory specimens during the acute phase of infection. The lowest concentration of SARS-CoV-2 viral copies this assay can detect is 138 copies/mL. A negative result does not preclude SARS-Cov-2 infection and should not be used as the sole basis for treatment or other patient management decisions. A negative result may occur with  improper specimen collection/handling, submission of specimen other than nasopharyngeal swab, presence of viral mutation(s) within the areas targeted by this assay, and inadequate number of viral copies(<138 copies/mL). A negative result must be combined with clinical observations, patient history, and epidemiological information. The expected result is Negative.  Fact Sheet for Patients:  EntrepreneurPulse.com.au  Fact Sheet for Healthcare Providers:  IncredibleEmployment.be  This test is no t yet approved or cleared by the [place] FDA and  has been authorized for detection and/or diagnosis of SARS-CoV-2 by FDA under an Emergency Use Authorization (EUA). This EUA will remain  in effect (meaning this test can be used) for the duration of the COVID-19 declaration under Section 564(b)(1) of the Act, 21 U.S.C.section 360bbb-3(b)(1), unless the authorization is terminated  or revoked  sooner.       Influenza A by PCR NEGATIVE NEGATIVE Final   Influenza B by PCR NEGATIVE NEGATIVE Final    Comment: (NOTE) The Xpert Xpress SARS-CoV-2/FLU/RSV plus assay is intended as an aid in the diagnosis of influenza from Nasopharyngeal swab specimens and should not be used as a sole basis for treatment. Nasal washings and aspirates are unacceptable for Xpert Xpress SARS-CoV-2/FLU/RSV testing.  Fact Sheet for Patients: EntrepreneurPulse.com.au  Fact Sheet for Healthcare Providers: IncredibleEmployment.be  This test is not yet approved or cleared by the [place] FDA and has been authorized for detection and/or diagnosis of SARS-CoV-2 by FDA under an Emergency Use Authorization (EUA). This EUA will remain in effect (meaning this test can be  used) for the duration of the COVID-19 declaration under Section 564(b)(1) of the Act, 21 U.S.C. section 360bbb-3(b)(1), unless the authorization is terminated or revoked.  Performed at [facility], [place] [address] [place], [place] [address]   Resp Panel by RT-PCR (Flu A&B, Covid) Nasopharyngeal Swab     Status: None   Collection Time: 05/10/21 10:16 AM   Specimen: Nasopharyngeal Swab; Nasopharyngeal(NP) swabs in vial transport medium  Result Value Ref Range Status   SARS Coronavirus 2 by RT PCR NEGATIVE NEGATIVE Final    Comment: (NOTE) SARS-CoV-2 target nucleic acids are NOT DETECTED.  The SARS-CoV-2 RNA is generally detectable in upper respiratory specimens during the acute phase of infection. The lowest concentration of SARS-CoV-2 viral copies this assay can detect is 138 copies/mL. A negative result does not preclude SARS-Cov-2 infection and should not be used as the sole basis for treatment or other patient management decisions. A negative result may occur with  improper specimen collection/handling, submission of specimen other than nasopharyngeal swab, presence of  viral mutation(s) within the areas targeted by this assay, and inadequate number of viral copies(<138 copies/mL). A negative result must be combined with clinical observations, patient history, and epidemiological information. The expected result is Negative.  Fact Sheet for Patients:  EntrepreneurPulse.com.au  Fact Sheet for Healthcare Providers:  IncredibleEmployment.be  This test is no t yet approved or cleared by the [place] FDA and  has been authorized for detection and/or diagnosis of SARS-CoV-2 by FDA under an Emergency Use Authorization (EUA). This EUA will remain  in effect (meaning this test can be used) for the duration of the COVID-19 declaration under Section 564(b)(1) of the Act, 21 U.S.C.section 360bbb-3(b)(1), unless the authorization is terminated  or revoked sooner.       Influenza A by PCR NEGATIVE NEGATIVE Final   Influenza B by PCR NEGATIVE NEGATIVE Final    Comment: (NOTE) The Xpert Xpress SARS-CoV-2/FLU/RSV plus assay is intended as an aid in the diagnosis of influenza from Nasopharyngeal swab specimens and should not be used as a sole basis for treatment. Nasal washings and aspirates are unacceptable for Xpert Xpress SARS-CoV-2/FLU/RSV testing.  Fact Sheet for Patients: EntrepreneurPulse.com.au  Fact Sheet for Healthcare Providers: IncredibleEmployment.be  This test is not yet approved or cleared by the [place] FDA and has been authorized for detection and/or diagnosis of SARS-CoV-2 by FDA under an Emergency Use Authorization (EUA). This EUA will remain in effect (meaning this test can be used) for the duration of the COVID-19 declaration under Section 564(b)(1) of the Act, 21 U.S.C. section 360bbb-3(b)(1), unless the authorization is terminated or revoked.  Performed at [facility], [place] [address], [place], [place] [address]   Culture, blood  (routine x 2)     Status: None (Preliminary result)   Collection Time: 05/10/21 10:17 AM   Specimen: BLOOD  Result Value Ref Range Status   Specimen Description   Final    BLOOD RIGHT ANTECUBITAL Performed at [facility] [address] [place], [place] [address]    Special Requests   Final    BOTTLES DRAWN AEROBIC AND ANAEROBIC Blood Culture adequate volume Performed at [facility] [address], [place], [place] [address]    Culture   Final    NO GROWTH < 24 HOURS Performed at [facility] [address], [place], [place] [address]    Report Status PENDING  Incomplete  Culture, blood (routine x 2)  Status: None (Preliminary result)   Collection Time: 05/11/21  5:27 AM   Specimen: BLOOD  Result Value Ref Range Status   Specimen Description   Final    BLOOD LEFT ANTECUBITAL Performed at [facility], [place] [address], [place], [place] [address]    Special Requests   Final    BOTTLES DRAWN AEROBIC ONLY Blood Culture adequate volume Performed at [facility] [address] [place], [place] [address]    Culture PENDING  Incomplete   Report Status PENDING  Incomplete    Radiology Studies: CT Head Wo Contrast  Result Date: 05/10/2021 CLINICAL DATA:  Headache EXAM: CT HEAD WITHOUT CONTRAST TECHNIQUE: Contiguous axial images were obtained from the base of the skull through the vertex without intravenous contrast. RADIATION DOSE REDUCTION: This exam was performed according to the departmental dose-optimization program which includes automated exposure control, adjustment of the mA and/or kV according to patient size and/or use of iterative reconstruction technique. COMPARISON:  MRI brain 01/01/2021 FINDINGS: Brain: No acute intracranial hemorrhage, mass effect, or herniation. No extra-axial fluid collections. No evidence of acute territorial infarct. No hydrocephalus. Moderate cortical volume loss. Mild patchy  hypodensities in the periventricular and subcortical white matter, likely secondary to chronic microvascular ischemic changes. Vascular: No hyperdense vessel or unexpected calcification. Skull: Normal. Negative for fracture or focal lesion. Sinuses/Orbits: No acute finding. Other: None. IMPRESSION: Chronic changes with no acute intracranial process identified. Electronically Signed   By: Ofilia Neas M.D.   On: 05/10/2021 12:58   CT Angio Chest PE W and/or Wo Contrast  Result Date: 05/10/2021 CLINICAL DATA:  c/o SOB and dizziness, especially with movement x 2-3 days. Was seen here Monday for dizziness and nausea. Pt has lung CA, last received chemo and radiation Monday. EXAM: CT ANGIOGRAPHY CHEST WITH CONTRAST TECHNIQUE: Multidetector CT imaging of the chest was performed using the standard protocol during bolus administration of intravenous contrast. Multiplanar CT image reconstructions and MIPs were obtained to evaluate the vascular anatomy. RADIATION DOSE REDUCTION: This exam was performed according to the departmental dose-optimization program which includes automated exposure control, adjustment of the mA and/or kV according to patient size and/or use of iterative reconstruction technique. CONTRAST:  20mL OMNIPAQUE IOHEXOL 350 MG/ML SOLN COMPARISON:  Chest CT, 03/07/2021. FINDINGS: Cardiovascular: Pulmonary arteries are well opacified. There is no evidence of a pulmonary embolism. Heart is normal in size and configuration. No pericardial effusion. Great vessels are normal in caliber. Mild aortic atherosclerosis. Mediastinum/Nodes: Normal thyroid. No neck base, mediastinal or hilar masses or enlarged lymph nodes. Trachea and esophagus are unremarkable. Lungs/Pleura: Pleural based ill-defined right lower lobe mass measures approximately 5.9 x 3.9 x 5 cm, decreased in size from the prior exam. Ground-glass type opacities and linear opacities lie along the borders of the mass, and opacity and small cystic  spaces extend superior to the mass, mildly decreased from the prior CT. No new lung masses. No suspicious nodules. No other areas of consolidation. Stable emphysema. No pleural effusion or pneumothorax. Upper Abdomen: No acute abnormality. Musculoskeletal: No fracture or acute finding.  No bone lesion. Review of the MIP images confirms the above findings. IMPRESSION: 1. No evidence of a pulmonary embolism. No acute findings in the chest. 2. Right lower lobe mass and adjacent opacities have decreased in size from the prior CT consistent with a positive response to therapy for non-small cell lung carcinoma. Aortic Atherosclerosis (ICD10-I70.0) and Emphysema (ICD10-J43.9). Electronically Signed   By: Dedra Skeens.D.  On: 05/10/2021 13:25   CT ABDOMEN PELVIS W CONTRAST  Result Date: 05/10/2021 CLINICAL DATA:  Abdominal pain, nonlocalized EXAM: CT ABDOMEN AND PELVIS WITH CONTRAST TECHNIQUE: Multidetector CT imaging of the abdomen and pelvis was performed using the standard protocol following bolus administration of intravenous contrast. RADIATION DOSE REDUCTION: This exam was performed according to the departmental dose-optimization program which includes automated exposure control, adjustment of the mA and/or kV according to patient size and/or use of iterative reconstruction technique. CONTRAST:  65mL OMNIPAQUE IOHEXOL 350 MG/ML SOLN COMPARISON:  None. FINDINGS: Lower chest: See separately dictated CT of the chest for discussion of findings above the diaphragm. Hepatobiliary: No focal liver abnormality is seen. No gallstones, gallbladder wall thickening, or biliary dilatation. Pancreas: Unremarkable. No pancreatic ductal dilatation or surrounding inflammatory changes. Spleen: Normal in size without focal abnormality. Adrenals/Urinary Tract: Bilateral adrenal glands are unremarkable. No hydronephrosis or nephrolithiasis. Simple cyst of the upper pole of the left kidney. Normal bladder. Stomach/Bowel: Stomach is  within normal limits. Appendix appears normal. No evidence of bowel wall thickening, distention, or inflammatory changes. Vascular/Lymphatic: Aortic atherosclerosis. No enlarged abdominal or pelvic lymph nodes. Reproductive: Prostate calcifications, prostate otherwise unremarkable. Other: Small fat containing right inguinal hernia. No abdominopelvic ascites. Musculoskeletal: No acute or significant osseous findings. IMPRESSION: 1. No acute findings in the abdomen or pelvis. 2. See separately dictated CT of the chest for discussion of findings above the diaphragm. 3.  Aortic Atherosclerosis (ICD10-I70.0). Electronically Signed   By: Yetta Glassman M.D.   On: 05/10/2021 13:01      Ajai Terhaar T. [place]  If 7PM-7AM, please contact night-coverage [email or web address] 05/11/2021, 11:24 AM

## 2021-05-11 NOTE — Progress Notes (Signed)
PT Cancellation Note ? ?Patient Details ?Name: Brandon Rubio. ?MRN: [id number] ?DOB: March 24, 1950 ? ? ?Cancelled Treatment:    Reason Eval/Treat Not Completed: Other (comment)Pt sitting EOB and eating breakfast.  He states he is dizzy just sitting there and does not want to leave until he knows what is causing it.  He was agreeable to PT later once done eating.  Will check back. ? ? ?Galen Manila ?05/11/2021, 9:30 AM ?

## 2021-05-11 NOTE — Evaluation (Signed)
Physical Therapy Evaluation ?Patient Details ?Name: Brandon Rubio. ?MRN: [id number] ?DOB: Jul 06, 1950 ?Today's Date: 05/11/2021 ? ?History of Present Illness ? 71 year old M with PMH of stage III NSCLC on chemoradiation, CAD, IDDM-2, HTN and thrombocytopenia presenting with ongoing generalized weakness, dizziness, poor p.o. intake and exertional dyspnea for over a week and half, and admitted for dehydration, orthostasis, hyponatremia, concern for euglycemic DKA and failure to thrive.  ?Clinical Impression ? Pt admitted with above diagnosis.  Pt currently with functional limitations due to the deficits listed below (see PT Problem List). Pt will benefit from skilled PT to increase their independence and safety with mobility to allow discharge to the venue listed below.  Pt limited by dizziness.  Once dizziness goes away, anticipate pt should move well, but will continue to assess.  ? ?Orthostatics taken and entered into flowsheets. He was unable to tolerate standing 3 mins for last BP measurement. ?   ?   ? ?Recommendations for follow up therapy are one component of a multi-disciplinary discharge planning process, led by the attending physician.  Recommendations may be updated based on patient status, additional functional criteria and insurance authorization. ? ?Follow Up Recommendations Home health PT ? ?  ?Assistance Recommended at Discharge None  ?Patient can return home with the following ?   ? ?  ?Equipment Recommendations None recommended by PT  ?Recommendations for Other Services ?    ?  ?Functional Status Assessment Patient has had a recent decline in their functional status and demonstrates the ability to make significant improvements in function in a reasonable and predictable amount of time.  ? ?  ?Precautions / Restrictions Precautions ?Precautions: Fall;Other (comment) (Dizzy and orthostatic at eval) ?Restrictions ?Weight Bearing Restrictions: No  ? ?  ? ?Mobility ? Bed Mobility ?Overal bed mobility:  Needs Assistance ?Bed Mobility: Supine to Sit ?  ?  ?Supine to sit: Min assist ?  ?  ?General bed mobility comments: needed MIN A of PT's hand to help pull up to get trunk upright ?  ? ?Transfers ?Overall transfer level: Needs assistance ?Equipment used: None ?Transfers: Sit to/from Stand ?Sit to Stand: Min guard ?  ?  ?  ?  ?  ?General transfer comment: min/guard for safety.  Took standing BP and pt reported feeling dizzy.  He could not tolerate standing for 3 mins standing orthostatic BP. ?  ? ?Ambulation/Gait ?  ?  ?  ?  ?  ?  ?  ?General Gait Details: side steps to head of bed only ? ?Stairs ?  ?  ?  ?  ?  ? ?Wheelchair Mobility ?  ? ?Modified Rankin (Stroke Patients Only) ?  ? ?  ? ?Balance Overall balance assessment: No apparent balance deficits (not formally assessed) ?  ?  ?  ?  ?  ?  ?  ?  ?  ?  ?  ?  ?  ?  ?  ?  ?  ?  ?   ? ? ? ?Pertinent Vitals/Pain Pain Assessment ?Pain Assessment: No/denies pain  ? ? ?Home Living Family/patient expects to be discharged to:: Private residence ?Living Arrangements: Alone ?Available Help at Discharge: Family;Available 24 hours/day (sister and graddaughter) ?Type of Home: House ?  ?  ?  ?  ?  ?Home Equipment: None ?   ?  ?Prior Function   ?  ?  ?  ?  ?  ?  ?  ?  ?  ? ? ?Hand Dominance  ?   ? ?  ?  Extremity/Trunk Assessment  ? Upper Extremity Assessment ?Upper Extremity Assessment: Overall WFL for tasks assessed ?  ? ?Lower Extremity Assessment ?Lower Extremity Assessment: Overall WFL for tasks assessed ?  ? ?   ?Communication  ? Communication: No difficulties  ?Cognition Arousal/Alertness: Awake/alert ?Behavior During Therapy: [facility] for tasks assessed/performed ?Overall Cognitive Status: Within Functional Limits for tasks assessed ?  ?  ?  ?  ?  ?  ?  ?  ?  ?  ?  ?  ?  ?  ?  ?  ?  ?  ?  ? ?  ?General Comments   ? ?  ?Exercises    ? ?Assessment/Plan  ?  ?PT Assessment Patient needs continued PT services  ?PT Problem List Decreased activity tolerance;Decreased mobility ? ?   ?   ?PT Treatment Interventions DME instruction;Gait training;Functional mobility training;Therapeutic activities;Therapeutic exercise;Balance training   ? ?PT Goals (Current goals can be found in the Care Plan section)  ?Acute Rehab PT Goals ?Patient Stated Goal: Figure out why he keeps getting dizzy ?PT Goal Formulation: With patient ?Time For Goal Achievement: 05/25/21 ?Potential to Achieve Goals: Good ? ?  ?Frequency Min 3X/week ?  ? ? ?Co-evaluation   ?  ?  ?  ?  ? ? ?  ?AM-PAC PT "6 Clicks" Mobility  ?Outcome Measure Help needed turning from your back to your side while in a flat bed without using bedrails?: A Little ?Help needed moving from lying on your back to sitting on the side of a flat bed without using bedrails?: A Little ?Help needed moving to and from a bed to a chair (including a wheelchair)?: A Little ?Help needed standing up from a chair using your arms (e.g., wheelchair or bedside chair)?: A Little ?Help needed to walk in hospital room?: A Lot ?Help needed climbing 3-5 steps with a railing? : A Lot ?6 Click Score: 16 ? ?  ?End of Session   ?Activity Tolerance: Treatment limited secondary to medical complications (Comment) (dizziness, decreased BP and increased HR) ?Patient left: in bed (Echo had come by and so left in bed for them to do their testing) ?Nurse Communication: Mobility status ?PT Visit Diagnosis: Difficulty in walking, not elsewhere classified (R26.2);Dizziness and giddiness (R42) ?  ? ?Time: 9924-2683 ?PT Time Calculation (min) (ACUTE ONLY): 23 min ? ? ?Charges:   PT Evaluation ?$PT Eval Low Complexity: 1 Low ?PT Treatments ?$Therapeutic Activity: 8-22 mins ?  ?   ? ? ?Santiago Glad L. Tamala Julian, PT ? ?05/11/2021 ? ? ?Galen Manila ?05/11/2021, 11:42 AM ? ?

## 2021-05-11 NOTE — Assessment & Plan Note (Addendum)
Initially some concern about euglycemic DKA but remains acidotic despite gap closure.  Likely from IV fluid.  Resolved. ?-Discontinue sodium bicarbonate ?

## 2021-05-11 NOTE — Assessment & Plan Note (Addendum)
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  Orthostatic vitals positive. He has no focal neurodeficit, nystagmus or dysmetria.  CT head, TTE and carotid US without significant finding.  TSH normal.  Blood cultures NGTD.  A.m. cortisol low with minimal response after cosyntropin injection.  ACTH low.  Patient denies recent steroid use ?-Increased midodrine to 10 mg 3 times daily on 3/5 ?-Started p.o. Cortef 15 mg in the morning and 10 mg in the afternoon on 3/6 ?-Decreased home metoprolol to 12.5 mg twice daily ?-Ordered MRI brain to evaluate for secondary adrenal insufficiency ?-Discontinue Jardiance on discharge ?-PT/OT, TED hose, elevate head of bed above 30 degree ?-Add abdominal binder ?

## 2021-05-11 NOTE — Assessment & Plan Note (Addendum)
Could be from dehydration.  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  Also possible adrenal insufficiency.  Resolved ?-P.o. Cortef as above ?

## 2021-05-11 NOTE — Assessment & Plan Note (Addendum)
Nutrition Status: ?Nutrition Problem: Increased nutrient needs ?Etiology: cancer and cancer related treatments ?Signs/Symptoms: estimated needs ?Interventions: Ensure Enlive (each supplement provides 350kcal and 20 grams of protein), MVI, Prostat ?

## 2021-05-11 NOTE — Assessment & Plan Note (Addendum)
Encourage oral hydration. ?

## 2021-05-12 ENCOUNTER — Inpatient Hospital Stay (HOSPITAL_COMMUNITY): Payer: Medicare Other

## 2021-05-12 DIAGNOSIS — R55 Syncope and collapse: Secondary | ICD-10-CM

## 2021-05-12 DIAGNOSIS — E878 Other disorders of electrolyte and fluid balance, not elsewhere classified: Secondary | ICD-10-CM

## 2021-05-12 LAB — RENAL FUNCTION PANEL
Albumin: 3 g/dL — ABNORMAL LOW (ref 3.5–5.0)
Anion gap: 10 (ref 5–15)
BUN: <5 mg/dL — ABNORMAL LOW (ref 8–23)
CO2: 15 mmol/L — ABNORMAL LOW (ref 22–32)
Calcium: 7.7 mg/dL — ABNORMAL LOW (ref 8.9–10.3)
Chloride: 108 mmol/L (ref 98–111)
Creatinine, Ser: 0.59 mg/dL — ABNORMAL LOW (ref 0.61–1.24)
GFR, Estimated: >60 mL/min (ref >60)
Glucose, Bld: 84 mg/dL (ref 70–99)
Phosphorus: 1.8 mg/dL — ABNORMAL LOW (ref 2.5–4.6)
Potassium: 3.7 mmol/L (ref 3.5–5.1)
Sodium: 133 mmol/L — ABNORMAL LOW (ref 135–145)

## 2021-05-12 LAB — CBC
HCT: 28.2 % — ABNORMAL LOW (ref 39.0–52.0)
Hemoglobin: 9.5 g/dL — ABNORMAL LOW (ref 13.0–17.0)
MCH: 32.1 pg (ref 26.0–34.0)
MCHC: 33.7 g/dL (ref 30.0–36.0)
MCV: 95.3 fL (ref 80.0–100.0)
Platelets: 98 10*3/uL — ABNORMAL LOW (ref 150–400)
RBC: 2.96 MIL/uL — ABNORMAL LOW (ref 4.22–5.81)
RDW: 15.5 % (ref 11.5–15.5)
WBC: 1.7 10*3/uL — ABNORMAL LOW (ref 4.0–10.5)
nRBC: 0 % (ref 0.0–0.2)

## 2021-05-12 LAB — MAGNESIUM: Magnesium: 1.5 mg/dL — ABNORMAL LOW (ref 1.7–2.4)

## 2021-05-12 LAB — ACTH STIMULATION, 3 TIME POINTS
Cortisol, 30 Min: 8 ug/dL
Cortisol, 60 Min: 11.6 ug/dL
Cortisol, Base: 0.7 ug/dL

## 2021-05-12 LAB — BRAIN NATRIURETIC PEPTIDE: B Natriuretic Peptide: 25.2 pg/mL (ref 0.0–100.0)

## 2021-05-12 MED ORDER — MIDODRINE HCL 5 MG PO TABS
10.0000 mg | ORAL_TABLET | Freq: Three times a day (TID) | ORAL | Status: DC
  Administered 2021-05-12 – 2021-05-16 (×11): 10 mg via ORAL
  Filled 2021-05-12 (×12): qty 2

## 2021-05-12 MED ORDER — MAGNESIUM SULFATE 2 GM/50ML IV SOLN
2.0000 g | Freq: Once | INTRAVENOUS | Status: AC
Start: 2021-05-12 — End: 2021-05-12
  Administered 2021-05-12: 2 g via INTRAVENOUS
  Filled 2021-05-12: qty 50

## 2021-05-12 MED ORDER — POTASSIUM PHOSPHATES 15 MMOLE/5ML IV SOLN
30.0000 mmol | Freq: Once | INTRAVENOUS | Status: AC
  Administered 2021-05-12: 30 mmol via INTRAVENOUS
  Filled 2021-05-12: qty 10

## 2021-05-12 NOTE — Plan of Care (Signed)

## 2021-05-12 NOTE — Progress Notes (Signed)
Physical Therapy Treatment ?Patient Details ?Name: Brandon Rubio. ?MRN: [id number] ?DOB: 30-Jul-1950 ?Today's Date: 05/12/2021 ? ? ?History of Present Illness 71 year old M with PMH of stage III NSCLC on chemoradiation, CAD, IDDM-2, HTN and thrombocytopenia presenting with ongoing generalized weakness, dizziness, poor p.o. intake and exertional dyspnea for over a week and half, and admitted for dehydration, orthostasis, hyponatremia, concern for euglycemic DKA and failure to thrive. ? ?  ?PT Comments  ? ? Upon entry, pt was getting TED hose.  Education provided on application.  ?Orthostatics:  ?Supine 115/64 HR 97 ?Sitting 109/67 HR 100 ?Standing 86/58 HR 122- became symptomatic and had to sit down. Could not stand 3 mins. ?Sat for 3 minutes, then stood for 3 minutes and BP was 104/74 HR 124   ?Recommendations for follow up therapy are one component of a multi-disciplinary discharge planning process, led by the attending physician.  Recommendations may be updated based on patient status, additional functional criteria and insurance authorization. ? ?Follow Up Recommendations ? Home health PT ?  ?  ?Assistance Recommended at Discharge    ?Patient can return home with the following Assistance with cooking/housework ?  ?Equipment Recommendations ? Rolling walker (2 wheels)  ?  ?Recommendations for Other Services   ? ? ?  ?Precautions / Restrictions Precautions ?Precautions: Fall;Other (comment) ?Precaution Comments: orthostatic  ?  ? ?Mobility ? Bed Mobility ?  ?Bed Mobility: Supine to Sit, Sit to Supine ?  ?  ?Supine to sit: Supervision ?Sit to supine: Supervision ?  ?General bed mobility comments: bed flat ?  ? ?Transfers ?Overall transfer level: Needs assistance ?Equipment used: None ?Transfers: Sit to/from Stand ?Sit to Stand: Min guard ?  ?  ?  ?  ?  ?General transfer comment: min/guard for safety. Orthostatics taken ?  ? ?Ambulation/Gait ?Ambulation/Gait assistance: Min guard, +2 safety/equipment ?Gait Distance  (Feet): 12 Feet ?Assistive device: IV Pole ?Gait Pattern/deviations: Step-through pattern ?  ?  ?  ?General Gait Details: ambulated around bed after sat after orthostatics ? ? ?Stairs ?  ?  ?  ?  ?  ? ? ?Wheelchair Mobility ?  ? ?Modified Rankin (Stroke Patients Only) ?  ? ? ?  ?Balance   ?  ?  ?  ?  ?  ?  ?  ?  ?  ?  ?  ?  ?  ?  ?  ?  ?  ?  ?  ? ?  ?Cognition Arousal/Alertness: Awake/alert ?Behavior During Therapy: [facility] for tasks assessed/performed ?Overall Cognitive Status: Within Functional Limits for tasks assessed ?  ?  ?  ?  ?  ?  ?  ?  ?  ?  ?  ?  ?  ?  ?  ?  ?  ?  ?  ? ?  ?Exercises   ? ?  ?General Comments General comments (skin integrity, edema, etc.): Discussed home safety and use of RW at home.  he thought he could benefit from using one initially at home. ?  ?  ? ?Pertinent Vitals/Pain Pain Assessment ?Pain Assessment: Faces ?Faces Pain Scale: Hurts little more ?Pain Location: back ?Pain Descriptors / Indicators: Grimacing ?Pain Intervention(s): Limited activity within patient's tolerance  ? ? ?Home Living   ?  ?  ?  ?  ?  ?  ?  ?  ?  ?   ?  ?Prior Function    ?  ?  ?   ? ?PT Goals (current goals can now be found in  the care plan section) Acute Rehab PT Goals ?PT Goal Formulation: With patient ?Time For Goal Achievement: 05/25/21 ?Potential to Achieve Goals: Good ?Progress towards PT goals: Not progressing toward goals - comment (vitals and c/o dizziness) ? ?  ?Frequency ? ? ? Min 3X/week ? ? ? ?  ?PT Plan Current plan remains appropriate  ? ? ?Co-evaluation   ?Reason for Co-Treatment: Complexity of the patient's impairments (multi-system involvement);For patient/therapist safety ?PT goals addressed during session: Mobility/safety with mobility ?OT goals addressed during session: ADL's and self-care ?  ? ?  ?AM-PAC PT "6 Clicks" Mobility   ?Outcome Measure ? Help needed turning from your back to your side while in a flat bed without using bedrails?: None ?Help needed moving from lying on your back to  sitting on the side of a flat bed without using bedrails?: None ?Help needed moving to and from a bed to a chair (including a wheelchair)?: A Little ?Help needed standing up from a chair using your arms (e.g., wheelchair or bedside chair)?: A Little ?Help needed to walk in hospital room?: A Little ?Help needed climbing 3-5 steps with a railing? : A Little ?6 Click Score: 20 ? ?  ?End of Session   ?Activity Tolerance: Treatment limited secondary to medical complications (Comment);Other (comment) (dizziness) ?Patient left: in bed;with call bell/phone within reach ?Nurse Communication: Mobility status ?PT Visit Diagnosis: Difficulty in walking, not elsewhere classified (R26.2);Dizziness and giddiness (R42) ?  ? ? ?Time: 5859-2924 ?PT Time Calculation (min) (ACUTE ONLY): 21 min ? ?Charges:  $Therapeutic Activity: 8-22 mins          ?          ? ?Santiago Glad L. Tamala Julian, PT ? ?05/12/2021 ? ? ? ?Galen Manila ?05/12/2021, 4:02 PM ? ?

## 2021-05-12 NOTE — Evaluation (Signed)
Occupational Therapy Evaluation ?Patient Details ?Name: Brandon Rubio. ?MRN: [id number] ?DOB: [birth year] ?Today's Date: 05/12/2021 ? ? ?History of Present Illness 71 year old M with PMH of stage III NSCLC on chemoradiation, CAD, IDDM-2, HTN and thrombocytopenia presenting with ongoing generalized weakness, dizziness, poor p.o. intake and exertional dyspnea for over a week and half, and admitted for dehydration, orthostasis, hyponatremia, concern for euglycemic DKA and failure to thrive.  ? ?Clinical Impression ?  ?Patient is a 71 years old male who was admitted for above. Patient was noted to have increased dizziness with movement but patient reporting it was better from previous day. Patient was min  +2 for transfer from edge of bed for functional mobility in room to recliner with cues for safety. Patient was mod A for LB Dressing tasks with inability to bring foot up onto lap. Patient was noted to have decreased functional activity tolerance, decreased endurance, decreased safety awareness, and increased dizziness impacting participation in ADLs. Patient would continue to benefit from skilled OT services at this time while admitted and after d/c to address noted deficits in order to improve overall safety and independence in ADLs.  ?   ?   ? ?Recommendations for follow up therapy are one component of a multi-disciplinary discharge planning process, led by the attending physician.  Recommendations may be updated based on patient status, additional functional criteria and insurance authorization.  ? ?Follow Up Recommendations ? Home health OT  ?  ?Assistance Recommended at Discharge Frequent or constant Supervision/Assistance  ?Patient can return home with the following A little help with walking and/or transfers;A little help with bathing/dressing/bathroom;Assistance with cooking/housework;Direct supervision/assist for financial management;Assist for transportation;Help with stairs or ramp for entrance;Direct  supervision/assist for medications management ? ?  ?Functional Status Assessment ? Patient has had a recent decline in their functional status and demonstrates the ability to make significant improvements in function in a reasonable and predictable amount of time.  ?Equipment Recommendations ? None recommended by OT  ?  ?Recommendations for Other Services   ? ? ?  ?Precautions / Restrictions Precautions ?Precautions: Fall;Other (comment) ?Precaution Comments: orthostatic ?Restrictions ?Weight Bearing Restrictions: No  ? ?  ? ?Mobility Bed Mobility ?Overal bed mobility: Needs Assistance ?Bed Mobility: Supine to Sit ?  ?  ?Supine to sit: Supervision ?  ?  ?General bed mobility comments: able to come to EOB without A today ?  ? ?Transfers ?  ?  ?  ?  ?  ?  ?  ?  ?  ?  ?  ? ?  ?Balance Overall balance assessment: No apparent balance deficits (not formally assessed) ?  ?  ?  ?  ?  ?  ?  ?  ?  ?  ?  ?  ?  ?  ?  ?  ?  ?  ?   ? ?ADL either performed or assessed with clinical judgement  ? ?ADL Overall ADL's : Needs assistance/impaired ?Eating/Feeding: Set up;Sitting ?  ?Grooming: Wash/dry face;Sitting;Set up ?  ?Upper Body Bathing: Set up;Sitting ?  ?Lower Body Bathing: Minimal assistance;Sit to/from stand;Sitting/lateral leans ?  ?Upper Body Dressing : Set up;Sitting ?  ?Lower Body Dressing: Sit to/from stand;Sitting/lateral leans;Moderate assistance ?Lower Body Dressing Details (indicate cue type and reason): to adjust socks eob ?Toilet Transfer: Minimal assistance;+2 for safety/equipment;+2 for physical assistance;Ambulation ?Toilet Transfer Details (indicate cue type and reason): fromm R side of bed to recliner on other side of room ?Toileting- Clothing Manipulation and Hygiene: Moderate assistance;Sit to/from stand ?  ?  ?  ?  Functional mobility during ADLs: +2 for safety/equipment;+2 for physical assistance;Minimal assistance ?   ? ? ? ?Vision Patient Visual Report: No change from baseline;Other (comment) (dizziness  with orthostatic BP. reports less than previous day) ?Vision Assessment?: No apparent visual deficits  ?   ?Perception   ?  ?Praxis   ?  ? ?Pertinent Vitals/Pain Pain Assessment ?Pain Assessment: No/denies pain  ? ? ? ?Hand Dominance Right ?  ?Extremity/Trunk Assessment Upper Extremity Assessment ?Upper Extremity Assessment: Overall WFL for tasks assessed ?  ?Lower Extremity Assessment ?Lower Extremity Assessment: Defer to PT evaluation ?  ?Cervical / Trunk Assessment ?Cervical / Trunk Assessment: Normal ?  ?Communication Communication ?Communication: No difficulties ?  ?Cognition Arousal/Alertness: Awake/alert ?Behavior During Therapy: [facility] for tasks assessed/performed ?Overall Cognitive Status: Within Functional Limits for tasks assessed ?  ?  ?  ?  ?  ?  ?  ?  ?  ?  ?  ?  ?  ?  ?  ?  ?  ?  ?  ?General Comments    ? ?  ?Exercises   ?  ?Shoulder Instructions    ? ? ?Home Living Family/patient expects to be discharged to:: Private residence ?Living Arrangements: Alone ?Available Help at Discharge: Family;Available 24 hours/day (sister and granddaughter) ?Type of Home: House ?Home Access: Stairs to enter ?Entrance Stairs-Number of Steps: 1 large step ?  ?Home Layout: One level ?  ?  ?Bathroom Shower/Tub: Walk-in shower ?  ?  ?  ?  ?Home Equipment: None ?  ?  ?  ? ?  ?Prior Functioning/Environment Prior Level of Function : Independent/Modified Independent ?  ?  ?  ?  ?  ?  ?  ?  ?  ? ?  ?  ?OT Problem List: Decreased activity tolerance;Impaired balance (sitting and/or standing);Decreased safety awareness;Cardiopulmonary status limiting activity;Decreased knowledge of precautions;Decreased knowledge of use of DME or AE ?  ?   ?OT Treatment/Interventions: Self-care/ADL training;Therapeutic exercise;Neuromuscular education;Energy conservation;DME and/or AE instruction;Therapeutic activities;Balance training;Patient/family education  ?  ?OT Goals(Current goals can be found in the care plan section) Acute Rehab OT  Goals ?Patient Stated Goal: to get dizziness undet control ?OT Goal Formulation: With patient ?Time For Goal Achievement: 05/26/21 ?Potential to Achieve Goals: Good  ?OT Frequency: Min 2X/week ?  ? ?Co-evaluation PT/OT/SLP Co-Evaluation/Treatment: Yes ?Reason for Co-Treatment: Complexity of the patient's impairments (multi-system involvement);For patient/therapist safety ?PT goals addressed during session: Mobility/safety with mobility ?OT goals addressed during session: ADL's and self-care ?  ? ?  ?AM-PAC OT "6 Clicks" Daily Activity     ?Outcome Measure Help from another person eating meals?: None ?Help from another person taking care of personal grooming?: A Little ?Help from another person toileting, which includes using toliet, bedpan, or urinal?: A Lot ?Help from another person bathing (including washing, rinsing, drying)?: A Lot ?Help from another person to put on and taking off regular upper body clothing?: A Little ?Help from another person to put on and taking off regular lower body clothing?: A Lot ?6 Click Score: 16 ?  ?End of Session Nurse Communication: Mobility status ? ?Activity Tolerance: Patient tolerated treatment well ?Patient left: in chair;with call bell/phone within reach;with chair alarm set ? ?OT Visit Diagnosis: Unsteadiness on feet (R26.81);Other abnormalities of gait and mobility (R26.89);Muscle weakness (generalized) (M62.81)  ?              ?Time: 5364-6803 ?OT Time Calculation (min): 24 min ?Charges:  OT General Charges ?$OT Visit: 1 Visit ?OT Evaluation ?$  OT Eval Moderate Complexity: 1 Mod ? ?Flor Whitacre OTR/L, MS ?Acute Rehabilitation Department ?Office# [phone] ?Pager# [phone] ? ? ?Feliz Beam Dragan Tamburrino ?05/12/2021, 12:42 PM ?

## 2021-05-12 NOTE — Progress Notes (Signed)
Physical Therapy Treatment ?Patient Details ?Name: Brandon Rubio. ?MRN: [id number] ?DOB: Dec 08, 1950 ?Today's Date: 05/12/2021 ? ? ?History of Present Illness 71 year old M with PMH of stage III NSCLC on chemoradiation, CAD, IDDM-2, HTN and thrombocytopenia presenting with ongoing generalized weakness, dizziness, poor p.o. intake and exertional dyspnea for over a week and half, and admitted for dehydration, orthostasis, hyponatremia, concern for euglycemic DKA and failure to thrive. ? ?  ?PT Comments  ? ? Pt continued to be limited by dizziness. Orthostatics taken and in flow sheets.  He was able to ambulate around the bed to the recliner today and did not have any dizziness in sitting today which is an improvement.   ?Recommendations for follow up therapy are one component of a multi-disciplinary discharge planning process, led by the attending physician.  Recommendations may be updated based on patient status, additional functional criteria and insurance authorization. ? ?Follow Up Recommendations ? Home health PT ?  ?  ?Assistance Recommended at Discharge None  ?Patient can return home with the following   ?  ?Equipment Recommendations ? None recommended by PT  ?  ?Recommendations for Other Services   ? ? ?  ?Precautions / Restrictions Precautions ?Precautions: Fall;Other (comment) ?Precaution Comments: orthostatic ?Restrictions ?Weight Bearing Restrictions: No  ?  ? ?Mobility ? Bed Mobility ?  ?Bed Mobility: Supine to Sit ?  ?  ?Supine to sit: Supervision ?  ?  ?General bed mobility comments: able to come to EOB without A today ?  ? ?Transfers ?Overall transfer level: Needs assistance ?Equipment used: None ?Transfers: Sit to/from Stand ?Sit to Stand: Min guard ?  ?  ?  ?  ?  ?General transfer comment: min/guard for safety and took BP.  Attempted to stand for 3 mins, but had to sit 40 seconds later. ?  ? ?Ambulation/Gait ?Ambulation/Gait assistance: Min guard, +2 safety/equipment ?Gait Distance (Feet): 12  Feet ?Assistive device: IV Pole ?Gait Pattern/deviations: Step-through pattern ?  ?  ?  ?General Gait Details: ambulated around bed after sat after orthostatics ? ? ?Stairs ?  ?  ?  ?  ?  ? ? ?Wheelchair Mobility ?  ? ?Modified Rankin (Stroke Patients Only) ?  ? ? ?  ?Balance   ?  ?  ?  ?  ?  ?  ?  ?  ?  ?  ?  ?  ?  ?  ?  ?  ?  ?  ?  ? ?  ?Cognition Arousal/Alertness: Awake/alert ?Behavior During Therapy: [facility] for tasks assessed/performed ?Overall Cognitive Status: Within Functional Limits for tasks assessed ?  ?  ?  ?  ?  ?  ?  ?  ?  ?  ?  ?  ?  ?  ?  ?  ?  ?  ?  ? ?  ?Exercises   ? ?  ?General Comments   ?  ?  ? ?Pertinent Vitals/Pain Pain Assessment ?Pain Assessment: No/denies pain  ? ? ?Home Living Family/patient expects to be discharged to:: Private residence ?Living Arrangements: Alone ?Available Help at Discharge: Family;Available 24 hours/day (sister and granddaughter) ?Type of Home: House ?Home Access: Stairs to enter ?  ?Entrance Stairs-Number of Steps: 1 large step ?  ?Home Layout: One level ?Home Equipment: None ?   ?  ?Prior Function    ?  ?  ?   ? ?PT Goals (current goals can now be found in the care plan section) Acute Rehab PT Goals ?Patient Stated Goal: Figure out  why he keeps getting dizzy ?PT Goal Formulation: With patient ?Time For Goal Achievement: 05/25/21 ?Potential to Achieve Goals: Good ?Progress towards PT goals: Progressing toward goals ? ?  ?Frequency ? ? ? Min 3X/week ? ? ? ?  ?PT Plan Current plan remains appropriate  ? ? ?Co-evaluation PT/OT/SLP Co-Evaluation/Treatment: Yes ?Reason for Co-Treatment: Complexity of the patient's impairments (multi-system involvement);For patient/therapist safety ?PT goals addressed during session: Mobility/safety with mobility;Balance ?  ?  ? ?  ?AM-PAC PT "6 Clicks" Mobility   ?Outcome Measure ? Help needed turning from your back to your side while in a flat bed without using bedrails?: None ?Help needed moving from lying on your back to sitting on the  side of a flat bed without using bedrails?: None ?Help needed moving to and from a bed to a chair (including a wheelchair)?: A Little ?Help needed standing up from a chair using your arms (e.g., wheelchair or bedside chair)?: A Little ?Help needed to walk in hospital room?: A Little ?Help needed climbing 3-5 steps with a railing? : A Little ?6 Click Score: 20 ? ?  ?End of Session   ?Activity Tolerance: Treatment limited secondary to medical complications (Comment);Other (comment) (c/o dizziness) ?Patient left: in chair;with call bell/phone within reach;with chair alarm set ?  ?PT Visit Diagnosis: Difficulty in walking, not elsewhere classified (R26.2);Dizziness and giddiness (R42) ?  ? ? ?Time: 0301-3143 ?PT Time Calculation (min) (ACUTE ONLY): 24 min ? ?Charges:  $Gait Training: 8-22 mins          ?          ? ?Santiago Glad L. Tamala Julian, PT ? ?05/12/2021 ? ? ? ?Galen Manila ?05/12/2021, 12:09 PM ? ?

## 2021-05-12 NOTE — Progress Notes (Signed)
PROGRESS NOTE  Brandon Rubio. WEX:[id number] DOB: May 25, 1950   PCP: [name], MD  Patient is from: Home.  Lives alone.  DOA: 05/10/2021 LOS: 1  Chief complaints:  Chief Complaint  Patient presents with   Shortness of Breath   Dizziness     Brief Narrative / Interim history: 71 year old M with PMH of stage III NSCLC on chemoradiation, CAD, IDDM-2, HTN and thrombocytopenia presenting with ongoing generalized weakness, dizziness, poor p.o. intake and exertional dyspnea for over a week and half, and admitted for dehydration, orthostasis, hyponatremia, concern for euglycemic DKA and failure to thrive.  Previously improved with IV fluid in oncology office in ED and discharged home.  In ED, reportedly tachycardic to 130s but improved to 110s with IV fluid.  Normotensive.  CT head and CT abdomen and pelvis without acute finding.  CTA chest with decreased RLL mass and adjacent opacities.  Labs with mild hyponatremia and euglycemic DKA.  Has mild leukocytosis to 12.5.  Started on IV fluid and admitted.  Next day, continues to endorse dizziness that he describes as both spinning and lightheadedness.  Orthostatic vitals positive.  Work-up for orthostatic hypotension initiated.   Subjective: Seen and examined earlier this morning.  No major events overnight of this morning.  No complaints.  He denies dizziness lying in bed but has not got up yet.  He denies chest pain, shortness of breath, nausea, vomiting or abdominal pain.  Overall, he feels better today.  Objective: Vitals:   05/11/21 1641 05/11/21 2013 05/12/21 0433 05/12/21 1334  BP: 113/67 119/66 110/64 100/65  Pulse: (!) 108 98 (!) 102 100  Resp: (!) 22 18 18 18   Temp: 97.6 F (36.4 C) 97.7 F (36.5 C) 98.2 F (36.8 C) 98.1 F (36.7 C)  TempSrc: Oral Oral Oral Oral  SpO2: 98% 96% 97% 98%  Weight:      Height:        Examination:  GENERAL: No apparent distress.  Nontoxic. HEENT: MMM.  Vision and hearing  grossly intact.  NECK: Supple.  No apparent JVD.  RESP:  No IWOB.  Fair aeration bilaterally. CVS:  RRR. Heart sounds normal.  ABD/GI/GU: BS+. Abd soft, NTND.  MSK/EXT:  Moves extremities. No apparent deformity. No edema.  SKIN: no apparent skin lesion or wound NEURO: Awake and alert. Oriented appropriately.  No apparent focal neuro deficit. PSYCH: Calm. Normal affect.   Procedures:  None  Microbiology summarized: LFYBO-17 and influenza PCR nonreactive. Blood cultures NGTD  Assessment and Plan: * Orthostatic hypotension Orthostatic vitals positive.  Patient describes his dizziness as spinning and lightheadedness.  Neuro exam reassuring.  He has no focal neurodeficit, nystagmus or dysmetria.  CT head without acute finding.  A.m. cortisol low but patient had high dose dexamethasone the night before..  Other possibilities include chemo related neurotoxicity or autonomic neuropathy from diabetes.  Patient was also on metoprolol prior to admission.  TTE and carotid US without significant finding.  TSH within normal.  Blood cultures NGTD.  Improving. -Increase midodrine to 10 mg 3 times daily -Restarted metoprolol at 12.5 mg twice daily -PT/OT, TED hose, elevate head of bed above 30 degree  Low serum cortisol level Cortisol and ACTH stimulation test unreliable after high-dose dexamethasone -Consider repeat in the future   Dehydration Encourage oral hydration.  Metabolic acidosis Initially some concern about euglycemic DKA but remains acidotic despite gap closure.  Likely from IV fluid.  Improved. -Continue p.o. sodium bicarbonates -Recheck in the morning  FTT (failure to  thrive) in adult Nutrition Status:       Consult dietitian. Liberate diet  Dyspnea on exertion BNP and echocardiogram reassuring.  Likely deconditioning -PT/OT eval  Insulin dependent type 2 diabetes mellitus ([place]) Controlled.  DKA ruled out.  Acidosis and ketonuria likely from dehydration.  A1c 5.9%.  BMP  glucose normal.  -Initiate CBG monitoring if blood glucose elevated. -Hold home insulin, Jardiance and metformin. -Agree with holding Jardiance.  Pancytopenia ([place]) Likely due to chemotherapy. -Continue monitoring  Hypokalemia/hypomagnesemia/hypophosphatemia K3.7.  Mg 1.5.  P1.8. -IV potassium phosphate 30 mmol x 1 -IV magnesium sulfate 2 g x 1 -Recheck in the morning  Hyponatremia Could be from dehydration.  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 -Continue monitoring  Adenocarcinoma of right lung, stage 3 (HCC) On chemoradiation.  -Per oncology.  Physical deconditioning PT/OT  CAD (coronary artery disease) Denies history of stent or CABG.  No chest pain.  TTE reassuring. -Continue low-dose metoprolol         Body mass index is 27.26 kg/m.         DVT prophylaxis:  Place TED hose Start: 05/11/21 0932 SCDs Start: 05/10/21 1920  Code Status: Full code Family Communication: Patient and/or RN. Available if any question.  Level of care: Telemetry Status is: Inpatient The patient will remain inpatient because: Due to orthostatic hypotension and significant electrolyte derangements     Final disposition: Home with [place]    Consultants:  None   Sch Meds:  Scheduled Meds:  aspirin EC  81 mg Oral q AM   folic acid  1 mg Oral Daily   metoprolol tartrate  12.5 mg Oral BID   midodrine  10 mg Oral TID WC   pantoprazole  40 mg Oral Daily   simvastatin  40 mg Oral q1800   sodium bicarbonate  650 mg Oral TID   Continuous Infusions:  potassium PHOSPHATE IVPB (in mmol) 30 mmol (05/12/21 1344)   PRN Meds:.HYDROcodone-acetaminophen, lip balm, melatonin  Antimicrobials: Anti-infectives (From admission, onward)    None        I have personally reviewed the following labs and images: CBC: Recent Labs  Lab 05/06/21 1055 05/10/21 1016 05/11/21 0527 05/12/21 0757  WBC 3.5* 2.5* 1.8* 1.7*  NEUTROABS 2.2 1.5* 1.0*  --    HGB 12.5* 13.1 10.0* 9.5*  HCT 36.2* 38.3* 29.7* 28.2*  MCV 92.6 94.8 95.5 95.3  PLT 62* 99* 90* 98*   BMP &GFR Recent Labs  Lab 05/06/21 1055 05/10/21 1016 05/10/21 1216 05/11/21 0527 05/12/21 0757  NA 135 132* 131* 130* 133*  K 4.1 4.1 3.7 3.6 3.7  CL 102 103 108 107 108  CO2 19* 13* 10* 13* 15*  GLUCOSE 120* 135* 103* 91 84  BUN 15 13 11 8  <5*  CREATININE 0.94 1.02 0.74 0.73 0.59*  CALCIUM 9.4 9.0 7.6* 8.2* 7.7*  MG  --   --   --  1.5* 1.5*  PHOS  --   --   --   --  1.8*   Estimated Creatinine Clearance: 90.3 mL/min (A) (by C-G formula based on SCr of 0.59 mg/dL (L)). Liver & Pancreas: Recent Labs  Lab 05/06/21 1055 05/10/21 1016 05/12/21 0757  AST 17 17  --   ALT 12 14  --   ALKPHOS 55 64  --   BILITOT 0.7 0.8  --   PROT 7.7 8.0  --   ALBUMIN 4.1 4.1 3.0*   No results for input(s): LIPASE, AMYLASE in  the last 168 hours. No results for input(s): AMMONIA in the last 168 hours. Diabetic: Recent Labs    05/11/21 0527  HGBA1C 5.9*   No results for input(s): GLUCAP in the last 168 hours. Cardiac Enzymes: No results for input(s): CKTOTAL, CKMB, CKMBINDEX, TROPONINI in the last 168 hours. No results for input(s): PROBNP in the last 8760 hours. Coagulation Profile: Recent Labs  Lab 05/10/21 1016  INR 1.1   Thyroid Function Tests: Recent Labs    05/11/21 0527  TSH 1.298   Lipid Profile: No results for input(s): CHOL, HDL, LDLCALC, TRIG, CHOLHDL, LDLDIRECT in the last 72 hours. Anemia Panel: No results for input(s): VITAMINB12, FOLATE, FERRITIN, TIBC, IRON, RETICCTPCT in the last 72 hours. Urine analysis:    Component Value Date/Time   COLORURINE YELLOW 05/10/2021 1016   APPEARANCEUR CLEAR 05/10/2021 1016   LABSPEC 1.025 05/10/2021 1016   PHURINE 5.0 05/10/2021 1016   GLUCOSEU 150 (A) 05/10/2021 1016   HGBUR NEGATIVE 05/10/2021 1016   BILIRUBINUR NEGATIVE 05/10/2021 1016   KETONESUR 80 (A) 05/10/2021 1016   PROTEINUR 30 (A) 05/10/2021 1016    UROBILINOGEN 0.2 12/11/2007 2313   NITRITE NEGATIVE 05/10/2021 1016   LEUKOCYTESUR NEGATIVE 05/10/2021 1016   Sepsis Labs: Invalid input(s): PROCALCITONIN, [place]  Microbiology: Recent Results (from the past 240 hour(s))  Resp Panel by RT-PCR (Flu A&B, Covid) Nasopharyngeal Swab     Status: None   Collection Time: 05/06/21 12:26 PM   Specimen: Nasopharyngeal Swab; Nasopharyngeal(NP) swabs in vial transport medium  Result Value Ref Range Status   SARS Coronavirus 2 by RT PCR NEGATIVE NEGATIVE Final    Comment: (NOTE) SARS-CoV-2 target nucleic acids are NOT DETECTED.  The SARS-CoV-2 RNA is generally detectable in upper respiratory specimens during the acute phase of infection. The lowest concentration of SARS-CoV-2 viral copies this assay can detect is 138 copies/mL. A negative result does not preclude SARS-Cov-2 infection and should not be used as the sole basis for treatment or other patient management decisions. A negative result may occur with  improper specimen collection/handling, submission of specimen other than nasopharyngeal swab, presence of viral mutation(s) within the areas targeted by this assay, and inadequate number of viral copies(<138 copies/mL). A negative result must be combined with clinical observations, patient history, and epidemiological information. The expected result is Negative.  Fact Sheet for Patients:  EntrepreneurPulse.com.au  Fact Sheet for Healthcare Providers:  IncredibleEmployment.be  This test is no t yet approved or cleared by the [place] FDA and  has been authorized for detection and/or diagnosis of SARS-CoV-2 by FDA under an Emergency Use Authorization (EUA). This EUA will remain  in effect (meaning this test can be used) for the duration of the COVID-19 declaration under Section 564(b)(1) of the Act, 21 U.S.C.section 360bbb-3(b)(1), unless the authorization is terminated  or revoked sooner.        Influenza A by PCR NEGATIVE NEGATIVE Final   Influenza B by PCR NEGATIVE NEGATIVE Final    Comment: (NOTE) The Xpert Xpress SARS-CoV-2/FLU/RSV plus assay is intended as an aid in the diagnosis of influenza from Nasopharyngeal swab specimens and should not be used as a sole basis for treatment. Nasal washings and aspirates are unacceptable for Xpert Xpress SARS-CoV-2/FLU/RSV testing.  Fact Sheet for Patients: EntrepreneurPulse.com.au  Fact Sheet for Healthcare Providers: IncredibleEmployment.be  This test is not yet approved or cleared by the [place] FDA and has been authorized for detection and/or diagnosis of SARS-CoV-2 by FDA under an Emergency Use Authorization (EUA). This  EUA will remain in effect (meaning this test can be used) for the duration of the COVID-19 declaration under Section 564(b)(1) of the Act, 21 U.S.C. section 360bbb-3(b)(1), unless the authorization is terminated or revoked.  Performed at [facility], [place] [address], [place], [place] [address]   Resp Panel by RT-PCR (Flu A&B, Covid) Nasopharyngeal Swab     Status: None   Collection Time: 05/10/21 10:16 AM   Specimen: Nasopharyngeal Swab; Nasopharyngeal(NP) swabs in vial transport medium  Result Value Ref Range Status   SARS Coronavirus 2 by RT PCR NEGATIVE NEGATIVE Final    Comment: (NOTE) SARS-CoV-2 target nucleic acids are NOT DETECTED.  The SARS-CoV-2 RNA is generally detectable in upper respiratory specimens during the acute phase of infection. The lowest concentration of SARS-CoV-2 viral copies this assay can detect is 138 copies/mL. A negative result does not preclude SARS-Cov-2 infection and should not be used as the sole basis for treatment or other patient management decisions. A negative result may occur with  improper specimen collection/handling, submission of specimen other than nasopharyngeal swab, presence of viral  mutation(s) within the areas targeted by this assay, and inadequate number of viral copies(<138 copies/mL). A negative result must be combined with clinical observations, patient history, and epidemiological information. The expected result is Negative.  Fact Sheet for Patients:  EntrepreneurPulse.com.au  Fact Sheet for Healthcare Providers:  IncredibleEmployment.be  This test is no t yet approved or cleared by the [place] FDA and  has been authorized for detection and/or diagnosis of SARS-CoV-2 by FDA under an Emergency Use Authorization (EUA). This EUA will remain  in effect (meaning this test can be used) for the duration of the COVID-19 declaration under Section 564(b)(1) of the Act, 21 U.S.C.section 360bbb-3(b)(1), unless the authorization is terminated  or revoked sooner.       Influenza A by PCR NEGATIVE NEGATIVE Final   Influenza B by PCR NEGATIVE NEGATIVE Final    Comment: (NOTE) The Xpert Xpress SARS-CoV-2/FLU/RSV plus assay is intended as an aid in the diagnosis of influenza from Nasopharyngeal swab specimens and should not be used as a sole basis for treatment. Nasal washings and aspirates are unacceptable for Xpert Xpress SARS-CoV-2/FLU/RSV testing.  Fact Sheet for Patients: EntrepreneurPulse.com.au  Fact Sheet for Healthcare Providers: IncredibleEmployment.be  This test is not yet approved or cleared by the [place] FDA and has been authorized for detection and/or diagnosis of SARS-CoV-2 by FDA under an Emergency Use Authorization (EUA). This EUA will remain in effect (meaning this test can be used) for the duration of the COVID-19 declaration under Section 564(b)(1) of the Act, 21 U.S.C. section 360bbb-3(b)(1), unless the authorization is terminated or revoked.  Performed at [facility], [place] [address] [place], [place] [address]   Culture, blood (routine  x 2)     Status: None (Preliminary result)   Collection Time: 05/10/21 10:17 AM   Specimen: BLOOD  Result Value Ref Range Status   Specimen Description   Final    BLOOD RIGHT ANTECUBITAL Performed at [facility] [address] [place], [place] [address]    Special Requests   Final    BOTTLES DRAWN AEROBIC AND ANAEROBIC Blood Culture adequate volume Performed at [place] [address] [place], [place] [address]    Culture   Final    NO GROWTH 2 DAYS Performed at [facility] [address] [place], [place] [address]    Report Status PENDING  Incomplete  Culture, blood (routine x 2)     Status: None (Preliminary result)   Collection Time: 05/11/21  5:27 AM   Specimen: BLOOD  Result Value Ref Range Status   Specimen Description   Final    BLOOD LEFT ANTECUBITAL Performed at [facility], [place] [address] [place], [place] [address]    Special Requests   Final    BOTTLES DRAWN AEROBIC ONLY Blood Culture adequate volume Performed at [facility] [address], [place], [place] [address]    Culture   Final    NO GROWTH 1 DAY Performed at [facility], [place] [address] [place], [place] [address]    Report Status PENDING  Incomplete    Radiology Studies: VAS US CAROTID  Result Date: 05/12/2021 Carotid Arterial Duplex Study Patient Name:  Brandon Rubio.  Date of Exam:   05/12/2021 Medical Rec #: [id number]              Accession #:    [id number] Date of Birth: 1950/06/09               Patient Gender: M Patient Age:   45 years Exam Location:  [facility] Procedure:      VAS US CAROTID Referring Phys: Bretta Bang Dayne Dekay --------------------------------------------------------------------------------  Indications:      Syncope. Risk Factors:     Hypertension, hyperlipidemia, Diabetes, coronary artery                   disease. Comparison Study: No prior studies. Performing Technologist: Oliver Hum  RVT  Examination Guidelines: A complete evaluation includes B-mode imaging, spectral Doppler, color Doppler, and power Doppler as needed of all accessible portions of each vessel. Bilateral testing is considered an integral part of a complete examination. Limited examinations for reoccurring indications may be performed as noted.  Right Carotid Findings: +----------+--------+--------+--------+-----------------------+--------+             PSV cm/s EDV cm/s Stenosis Plaque Description      Comments  +----------+--------+--------+--------+-----------------------+--------+  CCA Prox   90       21                smooth and heterogenous tortuous  +----------+--------+--------+--------+-----------------------+--------+  CCA Distal 77       20                smooth and heterogenous           +----------+--------+--------+--------+-----------------------+--------+  ICA Prox   65       16                smooth and heterogenous           +----------+--------+--------+--------+-----------------------+--------+  ICA Distal 90       31                                        tortuous  +----------+--------+--------+--------+-----------------------+--------+  ECA        65       10                                                  +----------+--------+--------+--------+-----------------------+--------+ +----------+--------+-------+--------+-------------------+  PSV cm/s EDV cms Describe Arm Pressure (mmHG)  +----------+--------+-------+--------+-------------------+  Subclavian 103                                            +----------+--------+-------+--------+-------------------+ +---------+--------+--+--------+--+---------+  Vertebral PSV cm/s 47 EDV cm/s 12 Antegrade  +---------+--------+--+--------+--+---------+  Left Carotid Findings: +----------+--------+--------+--------+-----------------------+--------+             PSV cm/s EDV cm/s Stenosis Plaque Description      Comments   +----------+--------+--------+--------+-----------------------+--------+  CCA Prox   116      32                smooth and heterogenous           +----------+--------+--------+--------+-----------------------+--------+  CCA Distal 98       17                smooth and heterogenous           +----------+--------+--------+--------+-----------------------+--------+  ICA Prox   89       37                smooth and heterogenous tortuous  +----------+--------+--------+--------+-----------------------+--------+  ICA Distal 85       34                                        tortuous  +----------+--------+--------+--------+-----------------------+--------+  ECA        97       11                                                  +----------+--------+--------+--------+-----------------------+--------+ +----------+--------+--------+--------+-------------------+             PSV cm/s EDV cm/s Describe Arm Pressure (mmHG)  +----------+--------+--------+--------+-------------------+  Subclavian 140                                             +----------+--------+--------+--------+-------------------+ +---------+--------+--+--------+--+---------+  Vertebral PSV cm/s 70 EDV cm/s 27 Antegrade  +---------+--------+--+--------+--+---------+   Summary: Right Carotid: Velocities in the right ICA are consistent with a 1-39% stenosis. Left Carotid: Velocities in the left ICA are consistent with a 1-39% stenosis. Vertebrals: Bilateral vertebral arteries demonstrate antegrade flow. *See table(s) above for measurements and observations.     Preliminary       Jamieka Royle T. [place]  If 7PM-7AM, please contact night-coverage [email or web address] 05/12/2021, 3:01 PM

## 2021-05-12 NOTE — Progress Notes (Signed)
Carotid artery duplex has been completed. ?Preliminary results can be found in CV Proc through chart review.  ? ?05/12/21 2:11 PM ?Carlos Levering RVT   ?

## 2021-05-13 ENCOUNTER — Ambulatory Visit: Payer: Medicare Other

## 2021-05-13 ENCOUNTER — Inpatient Hospital Stay: Payer: Medicare Other

## 2021-05-13 ENCOUNTER — Telehealth: Payer: Self-pay | Admitting: Internal Medicine

## 2021-05-13 ENCOUNTER — Ambulatory Visit: Payer: Medicare Other | Admitting: Radiation Oncology

## 2021-05-13 ENCOUNTER — Telehealth: Payer: Self-pay | Admitting: Medical Oncology

## 2021-05-13 ENCOUNTER — Inpatient Hospital Stay: Payer: Medicare Other | Admitting: Physician Assistant

## 2021-05-13 LAB — CBC
HCT: 27.6 % — ABNORMAL LOW (ref 39.0–52.0)
Hemoglobin: 9.7 g/dL — ABNORMAL LOW (ref 13.0–17.0)
MCH: 32.7 pg (ref 26.0–34.0)
MCHC: 35.1 g/dL (ref 30.0–36.0)
MCV: 92.9 fL (ref 80.0–100.0)
Platelets: 116 10*3/uL — ABNORMAL LOW (ref 150–400)
RBC: 2.97 MIL/uL — ABNORMAL LOW (ref 4.22–5.81)
RDW: 15.3 % (ref 11.5–15.5)
WBC: 1.8 10*3/uL — ABNORMAL LOW (ref 4.0–10.5)
nRBC: 0 % (ref 0.0–0.2)

## 2021-05-13 LAB — RENAL FUNCTION PANEL
Albumin: 3.1 g/dL — ABNORMAL LOW (ref 3.5–5.0)
Anion gap: 9 (ref 5–15)
BUN: <5 mg/dL — ABNORMAL LOW (ref 8–23)
CO2: 20 mmol/L — ABNORMAL LOW (ref 22–32)
Calcium: 8.3 mg/dL — ABNORMAL LOW (ref 8.9–10.3)
Chloride: 107 mmol/L (ref 98–111)
Creatinine, Ser: 0.64 mg/dL (ref 0.61–1.24)
GFR, Estimated: >60 mL/min (ref >60)
Glucose, Bld: 95 mg/dL (ref 70–99)
Phosphorus: 2.1 mg/dL — ABNORMAL LOW (ref 2.5–4.6)
Potassium: 3.5 mmol/L (ref 3.5–5.1)
Sodium: 136 mmol/L (ref 135–145)

## 2021-05-13 LAB — MAGNESIUM: Magnesium: 1.6 mg/dL — ABNORMAL LOW (ref 1.7–2.4)

## 2021-05-13 LAB — ACTH: C206 ACTH: <1.5 pg/mL — ABNORMAL LOW (ref 7.2–63.3)

## 2021-05-13 MED ORDER — POTASSIUM PHOSPHATES 15 MMOLE/5ML IV SOLN
30.0000 mmol | Freq: Once | INTRAVENOUS | Status: AC
  Administered 2021-05-13: 30 mmol via INTRAVENOUS
  Filled 2021-05-13: qty 10

## 2021-05-13 MED ORDER — ADULT MULTIVITAMIN W/MINERALS CH
1.0000 | ORAL_TABLET | Freq: Every day | ORAL | Status: DC
  Administered 2021-05-13 – 2021-05-16 (×4): 1 via ORAL
  Filled 2021-05-13 (×4): qty 1

## 2021-05-13 MED ORDER — ENSURE ENLIVE PO LIQD
237.0000 mL | Freq: Three times a day (TID) | ORAL | Status: DC
  Administered 2021-05-13 – 2021-05-14 (×2): 237 mL via ORAL

## 2021-05-13 MED ORDER — MAGNESIUM SULFATE 2 GM/50ML IV SOLN
2.0000 g | Freq: Once | INTRAVENOUS | Status: AC
  Administered 2021-05-13: 2 g via INTRAVENOUS
  Filled 2021-05-13: qty 50

## 2021-05-13 MED ORDER — METOPROLOL TARTRATE 25 MG PO TABS
25.0000 mg | ORAL_TABLET | Freq: Two times a day (BID) | ORAL | Status: DC
  Administered 2021-05-13: 25 mg via ORAL
  Filled 2021-05-13 (×2): qty 1

## 2021-05-13 MED ORDER — HYDROCORTISONE 5 MG PO TABS
15.0000 mg | ORAL_TABLET | Freq: Every day | ORAL | Status: DC
  Administered 2021-05-13 – 2021-05-16 (×4): 15 mg via ORAL
  Filled 2021-05-13 (×4): qty 1

## 2021-05-13 MED ORDER — HYDROCORTISONE 10 MG PO TABS
10.0000 mg | ORAL_TABLET | Freq: Every evening | ORAL | Status: DC
  Administered 2021-05-13 – 2021-05-15 (×3): 10 mg via ORAL
  Filled 2021-05-13 (×5): qty 1

## 2021-05-13 NOTE — Progress Notes (Signed)
Initial Nutrition Assessment ? ?INTERVENTION:  ? ?-Ensure Plus High Protein po TID, each supplement provides 350 kcal and 20 grams of protein. ? ?-Multivitamin with minerals daily ? ?NUTRITION DIAGNOSIS:  ? ?Increased nutrient needs related to cancer and cancer related treatments as evidenced by estimated needs. ? ?GOAL:  ? ?Patient will meet greater than or equal to 90% of their needs ? ?MONITOR:  ? ?PO intake, Supplement acceptance, Labs, Weight trends, I & O's ? ?REASON FOR ASSESSMENT:  ? ?Consult ?Assessment of nutrition requirement/status ? ?ASSESSMENT:  ? ?71 year old M with PMH of stage III NSCLC on chemoradiation, CAD, IDDM-2, HTN and thrombocytopenia presenting with ongoing generalized weakness, dizziness, poor p.o. intake and exertional dyspnea for over a week and half, and admitted for dehydration, orthostasis, hyponatremia, concern for euglycemic DKA and failure to thrive. ? ?Upon entering room, pt's bed alarm was going off. RD entered room as well as nurse tech. Pt states he is trying to use the bathroom and wants to get up to do so. Tech explained to pt that he has difficulty walking right now. Pt irritated and asked for everyone to leave his room so he can urinate. RD to see pt at a later time.  ? ?Per chart review, pt has had poor appetite and PO PTA (~1.5 weeks). Pt did consume 95% of breakfast this morning (eggs, bacon, roll). ?Will order Ensure supplements given increased needs (undergoing chemoradiation for lung cancer). ? ?Per weight records, pt has lost 8 lbs since 04/15/21 (3% wt loss x 1 month, insignificant for time frame).  ? ?Medications: Folic acid, IV Mg sulfate, K-Phos ? ?Labs reviewed: ? Low Mg, Phos ? ?NUTRITION - FOCUSED PHYSICAL EXAM: ? ?Will attempt at follow-up. ? ?Diet Order:   ?Diet Order   ? ?       ?  Diet regular Room service appropriate? Yes; Fluid consistency: Thin  Diet effective now       ?  ? ?  ?  ? ?  ? ? ?EDUCATION NEEDS:  ? ?Not appropriate for education at this  time ? ?Skin:  Skin Assessment: Reviewed RN Assessment ? ?Last BM:  3/2 ? ?Height:  ? ?Ht Readings from Last 1 Encounters:  ?05/10/21 6' (1.829 m)  ? ? ?Weight:  ? ?Wt Readings from Last 1 Encounters:  ?05/10/21 91.2 kg  ? ? ?BMI:  Body mass index is 27.26 kg/m?. ? ?Estimated Nutritional Needs:  ? ?Kcal:  2500-2700 ? ?Protein:  120-135g ? ?Fluid:  >2L/day ? ? ?Clayton Bibles, MS, RD, LDN ?Inpatient Clinical Dietitian ?Contact information available via Amion ? ?

## 2021-05-13 NOTE — Telephone Encounter (Signed)
Scheduled per 3/6 in basket, message has been left with pt ?

## 2021-05-13 NOTE — Telephone Encounter (Signed)
Hospital admission-FYI per sister ,Brandon Rubio was admitted to [facility] last Friday. Orthostatic hypotension/dizzy/nausea. ?

## 2021-05-13 NOTE — Progress Notes (Signed)
PROGRESS NOTE  Brandon Rubio. YOV:[id number] DOB: Jun 01, 1950   PCP: [name], MD  Patient is from: Home.  Lives alone.  DOA: 05/10/2021 LOS: 2  Chief complaints:  Chief Complaint  Patient presents with   Shortness of Breath   Dizziness     Brief Narrative / Interim history: 71 year old M with PMH of stage III NSCLC on chemoradiation, CAD, IDDM-2, HTN and thrombocytopenia presenting with ongoing generalized weakness, dizziness, poor p.o. intake and exertional dyspnea for over a week and half, and admitted for dehydration, orthostasis, hyponatremia, concern for euglycemic DKA and failure to thrive.  Previously improved with IV fluid in oncology office in ED and discharged home.  In ED, reportedly tachycardic to 130s but improved to 110s with IV fluid.  Normotensive.  CT head and CT abdomen and pelvis without acute finding.  CTA chest with decreased RLL mass and adjacent opacities.  Labs with mild hyponatremia and euglycemic DKA.  Has mild leukocytosis to 12.5.  Started on IV fluid and admitted.  Next day, continues to endorse dizziness that he describes as both spinning and lightheadedness.  Orthostatic vitals positive.  Work-up for orthostatic hypotension initiated.   Subjective: Seen and examined earlier this morning.  He reports improvement in his dizziness.  He says he got up and went to the bathroom this morning with supervision.  However, he is nursing tech tells me that he was off when he got up and had to sit back down on the bed.  Orthostatic vitals positive.  He likes to get up and walk.  I recommended doing this with supervision.  Objective: Vitals:   05/13/21 0806 05/13/21 0909 05/13/21 1041 05/13/21 1413  BP:  103/65 103/63 103/66  Pulse:  (!) 109  (!) 103  Resp:  15  18  Temp:  97.6 F (36.4 C)  98 F (36.7 C)  TempSrc:  Oral  Oral  SpO2: 97% 97%  94%  Weight:      Height:        Examination:  GENERAL: No apparent distress.  Nontoxic. HEENT:  MMM.  Vision and hearing grossly intact.  NECK: Supple.  No apparent JVD.  RESP:  No IWOB.  Fair aeration bilaterally. CVS:  RRR. Heart sounds normal.  ABD/GI/GU: BS+. Abd soft, NTND.  MSK/EXT:  Moves extremities. No apparent deformity. No edema.  SKIN: no apparent skin lesion or wound NEURO: Awake and alert. Oriented appropriately.  No apparent focal neuro deficit. PSYCH: Calm. Normal affect.   Procedures:  None  Microbiology summarized: [id number] and influenza PCR nonreactive. 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  Orthostatic vitals positive. He has no focal neurodeficit, nystagmus or dysmetria.  CT head, TTE and carotid US without significant finding.  TSH normal.  Blood cultures NGTD.  A.m. cortisol low with minimal response after cosyntropin injection. Improving. -Increased midodrine to 10 mg 3 times daily on 3/5 -Started p.o. Cortef 15 mg in the morning and 10 mg in the afternoon on 3/6 -Decreased home metoprolol -Discontinue Jardiance on discharge -PT/OT, TED hose, elevate head of bed above 30 degree  Low serum cortisol level Concern for adrenal insufficiency.  Minimal response to cosyntropin raises concern for primary AI. -Started p.o. Cortef 15 mg in the morning and 10 mg in the afternoon -Follow ACTH level  Dehydration Encourage oral hydration.  Metabolic acidosis Initially some concern about euglycemic DKA but  remains acidotic despite gap closure.  Likely from IV fluid.  Improved. -Continue p.o. sodium bicarbonates -Recheck in the morning  FTT (failure to thrive) in adult Nutrition Status: Nutrition Problem: Increased nutrient needs Etiology: cancer and cancer related treatments Signs/Symptoms: estimated needs Interventions: Ensure Enlive (each supplement provides 350kcal and 20 grams of protein), MVI,  Prostat  Dyspnea on exertion BNP and echocardiogram reassuring.  Likely deconditioning -PT/OT eval  Insulin dependent type 2 diabetes mellitus ([place]) Controlled.  DKA ruled out.  Acidosis and ketonuria likely from dehydration.  A1c 5.9%.  BMP glucose normal. -Initiate CBG monitoring if blood glucose elevated. -Hold home insulin, Jardiance and metformin. -Agree with discontinuing Jardiance on discharge  Pancytopenia ([place]) Likely due to chemotherapy. -Continue monitoring  Hypokalemia/hypomagnesemia/hypophosphatemia K3.5.  P2.1.  Mg 1.6. -IV potassium phosphate 30 mmol x 1 -IV magnesium sulfate 2 g x 1  Hyponatremia Could be from dehydration.  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  Also possible adrenal insufficiency.  Resolved -P.o. Cortef as above  Adenocarcinoma of right lung, stage 3 ([place]) On chemoradiation.  -Per oncology.  Physical deconditioning PT/OT  CAD (coronary artery disease) Denies history of stent or CABG.  No chest pain.  TTE reassuring. -Continue low-dose metoprolol         Body mass index is 27.26 kg/m. Nutrition Problem: Increased nutrient needs Etiology: cancer and cancer related treatments Signs/Symptoms: estimated needs Interventions: Ensure Enlive (each supplement provides 350kcal and 20 grams of protein), MVI, Prostat   DVT prophylaxis:  Place TED hose Start: 05/11/21 0932 SCDs Start: 05/10/21 1920  Code Status: Full code Family Communication: Patient and/or RN. Available if any question.  Level of care: Telemetry Status is: Inpatient The patient will remain inpatient because: Due to orthostatic hypotension and significant electrolyte derangements     Final disposition: Home with HH and DME    Consultants:  None   Sch Meds:  Scheduled Meds:  aspirin EC  81 mg Oral q AM   feeding supplement  237 mL Oral TID BM   folic acid  1 mg Oral Daily   hydrocortisone  15 mg Oral Q breakfast   And    hydrocortisone  10 mg Oral QPM   metoprolol tartrate  25 mg Oral BID   midodrine  10 mg Oral TID WC   multivitamin with minerals  1 tablet Oral Daily   pantoprazole  40 mg Oral Daily   simvastatin  40 mg Oral q1800   sodium bicarbonate  650 mg Oral TID   Continuous Infusions:  potassium PHOSPHATE IVPB (in mmol) 30 mmol (05/13/21 1153)   PRN Meds:.HYDROcodone-acetaminophen, lip balm, melatonin  Antimicrobials: Anti-infectives (From admission, onward)    None        I have personally reviewed the following labs and images: CBC: Recent Labs  Lab 05/10/21 1016 05/11/21 0527 05/12/21 0757 05/13/21 0712  WBC 2.5* 1.8* 1.7* 1.8*  NEUTROABS 1.5* 1.0*  --   --   HGB 13.1 10.0* 9.5* 9.7*  HCT 38.3* 29.7* 28.2* 27.6*  MCV 94.8 95.5 95.3 92.9  PLT 99* 90* 98* 116*   BMP &GFR Recent Labs  Lab 05/10/21 1016 05/10/21 1216 05/11/21 0527 05/12/21 0757 05/13/21 0712  NA 132* 131* 130* 133* 136  K 4.1 3.7 3.6 3.7 3.5  CL 103 108 107 108 107  CO2 13* 10* 13* 15* 20*  GLUCOSE 135* 103* 91 84 95  BUN 13 11 8  <5* <5*  CREATININE 1.02 0.74 0.73 0.59* 0.64  CALCIUM 9.0 7.6* 8.2* 7.7* 8.3*  MG  --   --  1.5* 1.5* 1.6*  PHOS  --   --   --  1.8* 2.1*   Estimated Creatinine Clearance: 90.3 mL/min (by C-G formula based on SCr of 0.64 mg/dL). Liver & Pancreas: Recent Labs  Lab 05/10/21 1016 05/12/21 0757 05/13/21 0712  AST 17  --   --   ALT 14  --   --   ALKPHOS 64  --   --   BILITOT 0.8  --   --   PROT 8.0  --   --   ALBUMIN 4.1 3.0* 3.1*   No results for input(s): LIPASE, AMYLASE in the last 168 hours. No results for input(s): AMMONIA in the last 168 hours. Diabetic: Recent Labs    05/11/21 0527  HGBA1C 5.9*   No results for input(s): GLUCAP in the last 168 hours. Cardiac Enzymes: No results for input(s): CKTOTAL, CKMB, CKMBINDEX, TROPONINI in the last 168 hours. No results for input(s): PROBNP in the last 8760 hours. Coagulation Profile: Recent Labs  Lab  05/10/21 1016  INR 1.1   Thyroid Function Tests: Recent Labs    05/11/21 0527  TSH 1.298   Lipid Profile: No results for input(s): CHOL, HDL, LDLCALC, TRIG, CHOLHDL, LDLDIRECT in the last 72 hours. Anemia Panel: No results for input(s): VITAMINB12, FOLATE, FERRITIN, TIBC, IRON, RETICCTPCT in the last 72 hours. Urine analysis:    Component Value Date/Time   COLORURINE YELLOW 05/10/2021 1016   APPEARANCEUR CLEAR 05/10/2021 1016   LABSPEC 1.025 05/10/2021 1016   PHURINE 5.0 05/10/2021 1016   GLUCOSEU 150 (A) 05/10/2021 1016   HGBUR NEGATIVE 05/10/2021 1016   BILIRUBINUR NEGATIVE 05/10/2021 1016   KETONESUR 80 (A) 05/10/2021 1016   PROTEINUR 30 (A) 05/10/2021 1016   UROBILINOGEN 0.2 12/11/2007 2313   NITRITE NEGATIVE 05/10/2021 1016   LEUKOCYTESUR NEGATIVE 05/10/2021 1016   Sepsis Labs: Invalid input(s): PROCALCITONIN, [place]  Microbiology: Recent Results (from the past 240 hour(s))  Resp Panel by RT-PCR (Flu A&B, Covid) Nasopharyngeal Swab     Status: None   Collection Time: 05/06/21 12:26 PM   Specimen: Nasopharyngeal Swab; Nasopharyngeal(NP) swabs in vial transport medium  Result Value Ref Range Status   SARS Coronavirus 2 by RT PCR NEGATIVE NEGATIVE Final    Comment: (NOTE) SARS-CoV-2 target nucleic acids are NOT DETECTED.  The SARS-CoV-2 RNA is generally detectable in upper respiratory specimens during the acute phase of infection. The lowest concentration of SARS-CoV-2 viral copies this assay can detect is 138 copies/mL. A negative result does not preclude SARS-Cov-2 infection and should not be used as the sole basis for treatment or other patient management decisions. A negative result may occur with  improper specimen collection/handling, submission of specimen other than nasopharyngeal swab, presence of viral mutation(s) within the areas targeted by this assay, and inadequate number of viral copies(<138 copies/mL). A negative result must be combined  with clinical observations, patient history, and epidemiological information. The expected result is Negative.  Fact Sheet for Patients:  EntrepreneurPulse.com.au  Fact Sheet for Healthcare Providers:  IncredibleEmployment.be  This test is no t yet approved or cleared by the [place] FDA and  has been authorized for detection and/or diagnosis of SARS-CoV-2 by FDA under an Emergency Use Authorization (EUA). This EUA will remain  in effect (meaning this test can be used) for the duration of the COVID-19 declaration under Section 564(b)(1) of the Act, 21 U.S.C.section 360bbb-3(b)(1), unless the authorization is terminated  or revoked sooner.       Influenza A by PCR NEGATIVE NEGATIVE Final   Influenza B by PCR NEGATIVE NEGATIVE Final    Comment: (NOTE) The Xpert Xpress SARS-CoV-2/FLU/RSV plus assay is intended as an aid in the diagnosis of influenza from Nasopharyngeal swab specimens and should not be used as a sole basis for treatment. Nasal washings and aspirates are unacceptable for Xpert Xpress SARS-CoV-2/FLU/RSV testing.  Fact Sheet for Patients: EntrepreneurPulse.com.au  Fact Sheet for Healthcare Providers: IncredibleEmployment.be  This test is not yet approved or cleared by the [place] FDA and has been authorized for detection and/or diagnosis of SARS-CoV-2 by FDA under an Emergency Use Authorization (EUA). This EUA will remain in effect (meaning this test can be used) for the duration of the COVID-19 declaration under Section 564(b)(1) of the Act, 21 U.S.C. section 360bbb-3(b)(1), unless the authorization is terminated or revoked.  Performed at [facility], [place] [address] [place], [place] [address]   Resp Panel by RT-PCR (Flu A&B, Covid) Nasopharyngeal Swab     Status: None   Collection Time: 05/10/21 10:16 AM   Specimen: Nasopharyngeal Swab; Nasopharyngeal(NP)  swabs in vial transport medium  Result Value Ref Range Status   SARS Coronavirus 2 by RT PCR NEGATIVE NEGATIVE Final    Comment: (NOTE) SARS-CoV-2 target nucleic acids are NOT DETECTED.  The SARS-CoV-2 RNA is generally detectable in upper respiratory specimens during the acute phase of infection. The lowest concentration of SARS-CoV-2 viral copies this assay can detect is 138 copies/mL. A negative result does not preclude SARS-Cov-2 infection and should not be used as the sole basis for treatment or other patient management decisions. A negative result may occur with  improper specimen collection/handling, submission of specimen other than nasopharyngeal swab, presence of viral mutation(s) within the areas targeted by this assay, and inadequate number of viral copies(<138 copies/mL). A negative result must be combined with clinical observations, patient history, and epidemiological information. The expected result is Negative.  Fact Sheet for Patients:  EntrepreneurPulse.com.au  Fact Sheet for Healthcare Providers:  IncredibleEmployment.be  This test is no t yet approved or cleared by the [place] FDA and  has been authorized for detection and/or diagnosis of SARS-CoV-2 by FDA under an Emergency Use Authorization (EUA). This EUA will remain  in effect (meaning this test can be used) for the duration of the COVID-19 declaration under Section 564(b)(1) of the Act, 21 U.S.C.section 360bbb-3(b)(1), unless the authorization is terminated  or revoked sooner.       Influenza A by PCR NEGATIVE NEGATIVE Final   Influenza B by PCR NEGATIVE NEGATIVE Final    Comment: (NOTE) The Xpert Xpress SARS-CoV-2/FLU/RSV plus assay is intended as an aid in the diagnosis of influenza from Nasopharyngeal swab specimens and should not be used as a sole basis for treatment. Nasal washings and aspirates are unacceptable for Xpert Xpress  SARS-CoV-2/FLU/RSV testing.  Fact Sheet for Patients: EntrepreneurPulse.com.au  Fact Sheet for Healthcare Providers: IncredibleEmployment.be  This test is not yet approved or cleared by the [place] FDA and has been authorized for detection and/or diagnosis of SARS-CoV-2 by FDA under an Emergency Use Authorization (EUA). This EUA will remain in effect (meaning this test can be used) for the duration of the COVID-19 declaration under Section 564(b)(1) of the Act, 21 U.S.C. section 360bbb-3(b)(1), unless the authorization is terminated or revoked.  Performed at [facility], [place] [address], [place], [place] [address]   Culture, blood (routine x 2)  Status: None (Preliminary result)   Collection Time: 05/10/21 10:17 AM   Specimen: BLOOD  Result Value Ref Range Status   Specimen Description   Final    BLOOD RIGHT ANTECUBITAL Performed at [facility] [address], [place], [place] [address]    Special Requests   Final    BOTTLES DRAWN AEROBIC AND ANAEROBIC Blood Culture adequate volume Performed at [place] [address] [place], [place] [address]    Culture   Final    NO GROWTH 3 DAYS Performed at [facility], [place] [address], [place], [place] [address]    Report Status PENDING  Incomplete  Culture, blood (routine x 2)     Status: None (Preliminary result)   Collection Time: 05/11/21  5:27 AM   Specimen: BLOOD  Result Value Ref Range Status   Specimen Description   Final    BLOOD LEFT ANTECUBITAL Performed at [facility], [place] [address] [place], [place] [address]    Special Requests   Final    BOTTLES DRAWN AEROBIC ONLY Blood Culture adequate volume Performed at [place] [address], [place], [place] [address]    Culture   Final    NO GROWTH 2 DAYS Performed at [facility] [address] [place], [place]  [address]    Report Status PENDING  Incomplete    Radiology Studies: No results found.    Arcenio Mullaly T. [place]  If 7PM-7AM, please contact night-coverage [email or web address] 05/13/2021, 4:17 PM

## 2021-05-13 NOTE — Progress Notes (Signed)
Orthostatic Lying: ?BP- 104/60 ?Pulse- 116 ? ?Orthostatic Sitting: ?BP- 107/60 ?Pulse- 126 ? ?Orthostatic Standing at 0 minutes: ?BP- 99/67 ?Pulse- 135 ? ?Orthostatic Standing at 3 minutes: ?*Unable to complete due to patient expressing dizziness  ? ? ?

## 2021-05-14 ENCOUNTER — Ambulatory Visit: Payer: Medicare Other

## 2021-05-14 ENCOUNTER — Ambulatory Visit: Payer: Medicare Other | Admitting: Radiation Oncology

## 2021-05-14 LAB — RENAL FUNCTION PANEL
Albumin: 3.1 g/dL — ABNORMAL LOW (ref 3.5–5.0)
Anion gap: 8 (ref 5–15)
BUN: 6 mg/dL — ABNORMAL LOW (ref 8–23)
CO2: 26 mmol/L (ref 22–32)
Calcium: 8.2 mg/dL — ABNORMAL LOW (ref 8.9–10.3)
Chloride: 103 mmol/L (ref 98–111)
Creatinine, Ser: 0.68 mg/dL (ref 0.61–1.24)
GFR, Estimated: >60 mL/min (ref >60)
Glucose, Bld: 104 mg/dL — ABNORMAL HIGH (ref 70–99)
Phosphorus: 2.3 mg/dL — ABNORMAL LOW (ref 2.5–4.6)
Potassium: 3.1 mmol/L — ABNORMAL LOW (ref 3.5–5.1)
Sodium: 137 mmol/L (ref 135–145)

## 2021-05-14 LAB — CBC
HCT: 27.2 % — ABNORMAL LOW (ref 39.0–52.0)
Hemoglobin: 9.7 g/dL — ABNORMAL LOW (ref 13.0–17.0)
MCH: 33.1 pg (ref 26.0–34.0)
MCHC: 35.7 g/dL (ref 30.0–36.0)
MCV: 92.8 fL (ref 80.0–100.0)
Platelets: 138 10*3/uL — ABNORMAL LOW (ref 150–400)
RBC: 2.93 MIL/uL — ABNORMAL LOW (ref 4.22–5.81)
RDW: 15.4 % (ref 11.5–15.5)
WBC: 2.1 10*3/uL — ABNORMAL LOW (ref 4.0–10.5)
nRBC: 0 % (ref 0.0–0.2)

## 2021-05-14 LAB — MAGNESIUM: Magnesium: 1.5 mg/dL — ABNORMAL LOW (ref 1.7–2.4)

## 2021-05-14 MED ORDER — MAGNESIUM SULFATE 4 GM/100ML IV SOLN
4.0000 g | Freq: Once | INTRAVENOUS | Status: AC
  Administered 2021-05-14: 4 g via INTRAVENOUS
  Filled 2021-05-14: qty 100

## 2021-05-14 MED ORDER — POTASSIUM PHOSPHATES 15 MMOLE/5ML IV SOLN
30.0000 mmol | Freq: Once | INTRAVENOUS | Status: AC
  Administered 2021-05-14: 30 mmol via INTRAVENOUS
  Filled 2021-05-14: qty 10

## 2021-05-14 MED ORDER — POTASSIUM CHLORIDE CRYS ER 20 MEQ PO TBCR
40.0000 meq | EXTENDED_RELEASE_TABLET | ORAL | Status: AC
  Administered 2021-05-14 (×2): 40 meq via ORAL
  Filled 2021-05-14 (×2): qty 2

## 2021-05-14 MED ORDER — METOPROLOL TARTRATE 25 MG PO TABS
12.5000 mg | ORAL_TABLET | Freq: Two times a day (BID) | ORAL | Status: DC
  Administered 2021-05-14 – 2021-05-16 (×4): 12.5 mg via ORAL
  Filled 2021-05-14 (×5): qty 1

## 2021-05-14 NOTE — TOC Initial Note (Signed)
Transition of Care (TOC) - Initial/Assessment Note  ? ? ?Patient Details  ?Name: Brandon Rubio. ?MRN: [id number] ?Date of Birth: 05-22-1950 ? ?Transition of Care (TOC) CM/SW Contact:    ?Celestial Barnfield, Marjie Skiff, RN ?Phone Number: ?05/14/2021, 2:22 PM ? ?Clinical Narrative:                 ?Spoke with pt for dc planning. Pt politely declines home health services at this time. He is requesting a regular rolling walker for home. [employer] contacted for delivery to pt room. ? ?Expected Discharge Plan: Home/Self Care ?Barriers to Discharge: Continued Medical Work up ? ? ?Patient Goals and CMS Choice ?Patient states their goals for this hospitalization and ongoing recovery are:: To go home ?  ?  ? ?Expected Discharge Plan and Services ?Expected Discharge Plan: Home/Self Care ?  ?Discharge Planning Services: CM Consult ?  ?Living arrangements for the past 2 months: [place] ?                ?DME Arranged: Walker rolling ?DME Agency: [employer] ?Date DME Agency Contacted: 05/14/21 ?Time DME Agency Contacted: [phone] ?Representative spoke with at DME Agency: Andee Poles ?  ?  ?  ?  ?  ? ?Prior Living Arrangements/Services ?Living arrangements for the past 2 months: [place] ?Lives with:: Self ?  ?Do you feel safe going back to the place where you live?: Yes      ?  ?  ?  ?Criminal Activity/Legal Involvement Pertinent to Current Situation/Hospitalization: No - Comment as needed ? ?Activities of Daily Living ?Home Assistive Devices/Equipment: None ?ADL Screening (condition at time of admission) ?Patient's cognitive ability adequate to safely complete daily activities?: Yes ?Is the patient deaf or have difficulty hearing?: No ?Does the patient have difficulty seeing, even when wearing glasses/contacts?: No ?Does the patient have difficulty concentrating, remembering, or making decisions?: No ?Patient able to express need for assistance with ADLs?: No ?Does the patient have difficulty dressing or bathing?:  No ?Independently performs ADLs?: Yes (appropriate for developmental age) ?Does the patient have difficulty walking or climbing stairs?: No ?Weakness of Legs: None ?Weakness of Arms/Hands: None ? ?Permission Sought/Granted ?Permission sought to share information with : Customer service manager ?Permission granted to share information with : Yes, Verbal Permission Granted ?   ?   ?   ?   ? ?Emotional Assessment ?Appearance:: Appears stated age ?Attitude/Demeanor/Rapport: Gracious ?Affect (typically observed): Calm ?Orientation: : Oriented to Self, Oriented to Place, Oriented to  Time, Oriented to Situation ?Alcohol / Substance Use: Not Applicable ?Psych Involvement: No (comment) ? ?Admission diagnosis:  Dehydration [E86.0] ?Dizziness [R42] ?Lightheadedness [R42] ?Orthostatic hypotension [I95.1] ?Patient Active Problem List  ? Diagnosis Date Noted  ? Hypophosphatemia 05/12/2021  ? Hypokalemia/hypomagnesemia/hypophosphatemia 05/11/2021  ? Low serum cortisol level 05/11/2021  ? Hypomagnesemia 05/11/2021  ? Pancytopenia ([place]) 05/11/2021  ? Metabolic acidosis 92/42/6834  ? Orthostatic hypotension 05/10/2021  ? FTT (failure to thrive) in adult 05/10/2021  ? Goals of care, counseling/discussion 03/18/2021  ? Hyponatremia 03/07/2021  ? Dehydration 03/07/2021  ? Dyspnea on exertion 03/07/2021  ? Encounter for antineoplastic chemotherapy 01/16/2021  ? Encounter for antineoplastic immunotherapy 01/16/2021  ? Adenocarcinoma of right lung, stage 3 ([place]) 12/24/2020  ? Hemoptysis 12/11/2020  ? Rash 12/11/2020  ? Right lower lobe lung mass 12/05/2020  ? Insomnia 10/05/2020  ? Routine general medical examination at a health care facility 08/13/2016  ? Neck pain 11/26/2012  ? Physical deconditioning 08/24/2012  ? GERD (gastroesophageal reflux disease)  09/05/2010  ? Insulin dependent type 2 diabetes mellitus ([place]) 12/10/2009  ? Smokers' cough ([place]) 09/25/2008  ? SKIN CANCER, HX OF 09/25/2008  ? Essential hypertension 03/20/2008   ? CAD (coronary artery disease) 03/20/2008  ? Hyperlipidemia associated with type 2 diabetes mellitus ([place]) 12/23/2007  ? ?PCP:  [name], MD ?Pharmacy:   ?[facility] [place] [facility] [place] [address] ?[place] [address] ?Phone: [phone] Fax: [phone] ? ? ? ? ?Social Determinants of Health (SDOH) Interventions ?  ? ?Readmission Risk Interventions ?No flowsheet data found. ? ? ?

## 2021-05-14 NOTE — Progress Notes (Signed)
PROGRESS NOTE  Brandon Rubio. YJE:[id number] DOB: 20-Jul-1950   PCP: [name], MD  Patient is from: Home.  Lives alone.  DOA: 05/10/2021 LOS: 3  Chief complaints:  Chief Complaint  Patient presents with   Shortness of Breath   Dizziness     Brief Narrative / Interim history: 71 year old M with PMH of stage III NSCLC on chemoradiation, CAD, IDDM-2, HTN and thrombocytopenia presenting with ongoing generalized weakness, dizziness, poor p.o. intake and exertional dyspnea for over a week and half, and admitted for dehydration, orthostasis, hyponatremia, concern for euglycemic DKA and failure to thrive.  Previously improved with IV fluid in oncology office in ED and discharged home.  In ED, reportedly tachycardic to 130s but improved to 110s with IV fluid.  Normotensive.  CT head and CT abdomen and pelvis without acute finding.  CTA chest with decreased RLL mass and adjacent opacities.  Labs with mild hyponatremia and euglycemic DKA.  Has mild leukocytosis to 12.5.  Started on IV fluid and admitted.  TTE and carotid US without significant finding.  TSH normal.  A.m. cortisol low with some response to cosyntropin injection.  ACTH level very low.  Remaining orthostatic with positive orthostatic vitals.  Started on midodrine and p.o. Cortef.  MRI brain ordered.  Therapy recommended home health and DME.   Subjective: Seen and examined earlier this morning.  No major events overnight or this morning.  He felt dizzy when he stood up for orthostatic vital check this morning.  He could not stand for 3 minutes.  Otherwise no complaints.  Objective: Vitals:   05/14/21 0959 05/14/21 1002 05/14/21 1005 05/14/21 1326  BP: 121/87 104/86 (!) 93/58 98/67  Pulse: (!) 107 (!) 126 (!) 132 91  Resp: 19 17  20   Temp: 98 F (36.7 C)   98.2 F (36.8 C)  TempSrc: Oral   Oral  SpO2: 95% 95% 94% 93%  Weight:      Height:        Examination:  GENERAL: No apparent distress.   Nontoxic. HEENT: MMM.  Vision and hearing grossly intact.  NECK: Supple.  No apparent JVD.  RESP:  No IWOB.  Fair aeration bilaterally. CVS:  RRR. Heart sounds normal.  ABD/GI/GU: BS+. Abd soft, NTND.  MSK/EXT:  Moves extremities. No apparent deformity. No edema.  SKIN: no apparent skin lesion or wound NEURO: Awake and alert. Oriented appropriately.  No apparent focal neuro deficit. PSYCH: Calm. Normal affect.   Procedures:  None  Microbiology summarized: [id number] and influenza PCR nonreactive. 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  Orthostatic vitals positive. He has no focal neurodeficit, nystagmus or dysmetria.  CT head, TTE and carotid US without significant finding.  TSH normal.  Blood cultures NGTD.  A.m. cortisol low with minimal response after cosyntropin injection.  ACTH low.  Patient denies recent steroid use -Increased midodrine to 10 mg 3 times daily on 3/5 -Started p.o. Cortef 15 mg in the morning and 10 mg in the afternoon on 3/6 -Decreased home metoprolol to 12.5 mg twice daily -Ordered MRI brain to evaluate for secondary adrenal insufficiency -Discontinue Jardiance on discharge -PT/OT, TED hose, elevate head of bed above 30 degree -Add abdominal binder  Low serum cortisol level Concern for adrenal insufficiency.  ACTH low as well.  However, minimal response to cosyntropin. -P.o. Cortef as above. -Follow MRI brain  Dehydration Encourage  oral hydration.  Metabolic acidosis Initially some concern about euglycemic DKA but remains acidotic despite gap closure.  Likely from IV fluid.  Resolved. -Discontinue sodium bicarbonate  FTT (failure to thrive) in adult Nutrition Status: Nutrition Problem: Increased nutrient needs Etiology: cancer and cancer related treatments Signs/Symptoms: estimated  needs Interventions: Ensure Enlive (each supplement provides 350kcal and 20 grams of protein), MVI, Prostat  Dyspnea on exertion BNP and echocardiogram reassuring.  Likely deconditioning -PT/OT eval  Insulin dependent type 2 diabetes mellitus ([place]) Controlled.  DKA ruled out.  Acidosis and ketonuria likely from dehydration.  A1c 5.9%.  BMP glucose normal. -Initiate CBG monitoring if blood glucose elevated. -Hold home insulin, Jardiance and metformin. -Agree with discontinuing Jardiance on discharge  Pancytopenia ([place]) Likely due to chemotherapy.  Improving. -Continue monitoring  Hypokalemia/hypomagnesemia/hypophosphatemia K3.1.  Mg 1.5.  P2.3. Replenish as appropriate. Recheck in the morning.  Hyponatremia Could be from dehydration.  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  Also possible adrenal insufficiency.  Resolved -P.o. Cortef as above  Adenocarcinoma of right lung, stage 3 ([place]) On chemoradiation.  -Per oncology.  Physical deconditioning PT/OT  CAD (coronary artery disease) Denies history of stent or CABG.  No chest pain.  TTE reassuring. -Continue low-dose metoprolol  Increased nutrient needs Body mass index is 27.26 kg/m. Nutrition Problem: Increased nutrient needs Etiology: cancer and cancer related treatments Signs/Symptoms: estimated needs Interventions: Ensure Enlive (each supplement provides 350kcal and 20 grams of protein), MVI, Prostat   DVT prophylaxis:  Place TED hose Start: 05/11/21 0932 SCDs Start: 05/10/21 1920  Code Status: Full code Family Communication: Patient and/or RN. Available if any question.  Level of care: Telemetry Status is: Inpatient The patient will remain inpatient because: Due to orthostatic hypotension and significant electrolyte derangements     Final disposition: Home with HH and DME    Consultants:  None   Sch Meds:  Scheduled Meds:  aspirin EC  81 mg Oral q AM   feeding  supplement  237 mL Oral TID BM   folic acid  1 mg Oral Daily   hydrocortisone  15 mg Oral Q breakfast   And   hydrocortisone  10 mg Oral QPM   metoprolol tartrate  12.5 mg Oral BID   midodrine  10 mg Oral TID WC   multivitamin with minerals  1 tablet Oral Daily   pantoprazole  40 mg Oral Daily   simvastatin  40 mg Oral q1800   sodium bicarbonate  650 mg Oral TID   Continuous Infusions:  potassium PHOSPHATE IVPB (in mmol) 30 mmol (05/14/21 1416)   PRN Meds:.HYDROcodone-acetaminophen, lip balm, melatonin  Antimicrobials: Anti-infectives (From admission, onward)    None        I have personally reviewed the following labs and images: CBC: Recent Labs  Lab 05/10/21 1016 05/11/21 0527 05/12/21 0757 05/13/21 0712 05/14/21 0755  WBC 2.5* 1.8* 1.7* 1.8* 2.1*  NEUTROABS 1.5* 1.0*  --   --   --   HGB 13.1 10.0* 9.5* 9.7* 9.7*  HCT 38.3* 29.7* 28.2* 27.6* 27.2*  MCV 94.8 95.5 95.3 92.9 92.8  PLT 99* 90* 98* 116* 138*   BMP &GFR Recent Labs  Lab 05/10/21 1216 05/11/21 0527 05/12/21 0757 05/13/21 0712 05/14/21 0755  NA 131* 130* 133* 136 137  K 3.7 3.6 3.7 3.5 3.1*  CL 108 107 108 107 103  CO2 10* 13* 15* 20* 26  GLUCOSE 103* 91 84 95 104*  BUN 11 8 <5* <5* 6*  CREATININE 0.74 0.73 0.59* 0.64 0.68  CALCIUM 7.6* 8.2* 7.7* 8.3* 8.2*  MG  --  1.5* 1.5* 1.6* 1.5*  PHOS  --   --  1.8* 2.1* 2.3*   Estimated Creatinine Clearance: 90.3 mL/min (by C-G formula based on SCr of 0.68 mg/dL). Liver & Pancreas: Recent Labs  Lab 05/10/21 1016 05/12/21 0757 05/13/21 0712 05/14/21 0755  AST 17  --   --   --   ALT 14  --   --   --   ALKPHOS 64  --   --   --   BILITOT 0.8  --   --   --   PROT 8.0  --   --   --   ALBUMIN 4.1 3.0* 3.1* 3.1*   No results for input(s): LIPASE, AMYLASE in the last 168 hours. No results for input(s): AMMONIA in the last 168 hours. Diabetic: No results for input(s): HGBA1C in the last 72 hours.  No results for input(s): GLUCAP in the last 168  hours. Cardiac Enzymes: No results for input(s): CKTOTAL, CKMB, CKMBINDEX, TROPONINI in the last 168 hours. No results for input(s): PROBNP in the last 8760 hours. Coagulation Profile: Recent Labs  Lab 05/10/21 1016  INR 1.1   Thyroid Function Tests: No results for input(s): TSH, T4TOTAL, FREET4, T3FREE, THYROIDAB in the last 72 hours.  Lipid Profile: No results for input(s): CHOL, HDL, LDLCALC, TRIG, CHOLHDL, LDLDIRECT in the last 72 hours. Anemia Panel: No results for input(s): VITAMINB12, FOLATE, FERRITIN, TIBC, IRON, RETICCTPCT in the last 72 hours. Urine analysis:    Component Value Date/Time   COLORURINE YELLOW 05/10/2021 1016   APPEARANCEUR CLEAR 05/10/2021 1016   LABSPEC 1.025 05/10/2021 1016   PHURINE 5.0 05/10/2021 1016   GLUCOSEU 150 (A) 05/10/2021 1016   HGBUR NEGATIVE 05/10/2021 1016   BILIRUBINUR NEGATIVE 05/10/2021 1016   KETONESUR 80 (A) 05/10/2021 1016   PROTEINUR 30 (A) 05/10/2021 1016   UROBILINOGEN 0.2 12/11/2007 2313   NITRITE NEGATIVE 05/10/2021 1016   LEUKOCYTESUR NEGATIVE 05/10/2021 1016   Sepsis Labs: Invalid input(s): PROCALCITONIN, [place]  Microbiology: Recent Results (from the past 240 hour(s))  Resp Panel by RT-PCR (Flu A&B, Covid) Nasopharyngeal Swab     Status: None   Collection Time: 05/06/21 12:26 PM   Specimen: Nasopharyngeal Swab; Nasopharyngeal(NP) swabs in vial transport medium  Result Value Ref Range Status   SARS Coronavirus 2 by RT PCR NEGATIVE NEGATIVE Final    Comment: (NOTE) SARS-CoV-2 target nucleic acids are NOT DETECTED.  The SARS-CoV-2 RNA is generally detectable in upper respiratory specimens during the acute phase of infection. The lowest concentration of SARS-CoV-2 viral copies this assay can detect is 138 copies/mL. A negative result does not preclude SARS-Cov-2 infection and should not be used as the sole basis for treatment or other patient management decisions. A negative result may occur with  improper  specimen collection/handling, submission of specimen other than nasopharyngeal swab, presence of viral mutation(s) within the areas targeted by this assay, and inadequate number of viral copies(<138 copies/mL). A negative result must be combined with clinical observations, patient history, and epidemiological information. The expected result is Negative.  Fact Sheet for Patients:  EntrepreneurPulse.com.au  Fact Sheet for Healthcare Providers:  IncredibleEmployment.be  This test is no t yet approved or cleared by the [place] FDA and  has been authorized for detection and/or diagnosis of SARS-CoV-2 by FDA under an Emergency Use Authorization (EUA). This EUA will remain  in effect (meaning this test can be  used) for the duration of the COVID-19 declaration under Section 564(b)(1) of the Act, 21 U.S.C.section 360bbb-3(b)(1), unless the authorization is terminated  or revoked sooner.       Influenza A by PCR NEGATIVE NEGATIVE Final   Influenza B by PCR NEGATIVE NEGATIVE Final    Comment: (NOTE) The Xpert Xpress SARS-CoV-2/FLU/RSV plus assay is intended as an aid in the diagnosis of influenza from Nasopharyngeal swab specimens and should not be used as a sole basis for treatment. Nasal washings and aspirates are unacceptable for Xpert Xpress SARS-CoV-2/FLU/RSV testing.  Fact Sheet for Patients: EntrepreneurPulse.com.au  Fact Sheet for Healthcare Providers: IncredibleEmployment.be  This test is not yet approved or cleared by the [place] FDA and has been authorized for detection and/or diagnosis of SARS-CoV-2 by FDA under an Emergency Use Authorization (EUA). This EUA will remain in effect (meaning this test can be used) for the duration of the COVID-19 declaration under Section 564(b)(1) of the Act, 21 U.S.C. section 360bbb-3(b)(1), unless the authorization is terminated or revoked.  Performed at  [facility], [place] [address] [place], [place] [address]   Resp Panel by RT-PCR (Flu A&B, Covid) Nasopharyngeal Swab     Status: None   Collection Time: 05/10/21 10:16 AM   Specimen: Nasopharyngeal Swab; Nasopharyngeal(NP) swabs in vial transport medium  Result Value Ref Range Status   SARS Coronavirus 2 by RT PCR NEGATIVE NEGATIVE Final    Comment: (NOTE) SARS-CoV-2 target nucleic acids are NOT DETECTED.  The SARS-CoV-2 RNA is generally detectable in upper respiratory specimens during the acute phase of infection. The lowest concentration of SARS-CoV-2 viral copies this assay can detect is 138 copies/mL. A negative result does not preclude SARS-Cov-2 infection and should not be used as the sole basis for treatment or other patient management decisions. A negative result may occur with  improper specimen collection/handling, submission of specimen other than nasopharyngeal swab, presence of viral mutation(s) within the areas targeted by this assay, and inadequate number of viral copies(<138 copies/mL). A negative result must be combined with clinical observations, patient history, and epidemiological information. The expected result is Negative.  Fact Sheet for Patients:  EntrepreneurPulse.com.au  Fact Sheet for Healthcare Providers:  IncredibleEmployment.be  This test is no t yet approved or cleared by the [place] FDA and  has been authorized for detection and/or diagnosis of SARS-CoV-2 by FDA under an Emergency Use Authorization (EUA). This EUA will remain  in effect (meaning this test can be used) for the duration of the COVID-19 declaration under Section 564(b)(1) of the Act, 21 U.S.C.section 360bbb-3(b)(1), unless the authorization is terminated  or revoked sooner.       Influenza A by PCR NEGATIVE NEGATIVE Final   Influenza B by PCR NEGATIVE NEGATIVE Final    Comment: (NOTE) The Xpert Xpress  SARS-CoV-2/FLU/RSV plus assay is intended as an aid in the diagnosis of influenza from Nasopharyngeal swab specimens and should not be used as a sole basis for treatment. Nasal washings and aspirates are unacceptable for Xpert Xpress SARS-CoV-2/FLU/RSV testing.  Fact Sheet for Patients: EntrepreneurPulse.com.au  Fact Sheet for Healthcare Providers: IncredibleEmployment.be  This test is not yet approved or cleared by the [place] FDA and has been authorized for detection and/or diagnosis of SARS-CoV-2 by FDA under an Emergency Use Authorization (EUA). This EUA will remain in effect (meaning this test can be used) for the duration of the COVID-19 declaration under Section 564(b)(1) of the Act, 21 U.S.C. section 360bbb-3(b)(1), unless the authorization is terminated  or revoked.  Performed at [facility], [place] [address] [place], [place] [address]   Culture, blood (routine x 2)     Status: None (Preliminary result)   Collection Time: 05/10/21 10:17 AM   Specimen: BLOOD  Result Value Ref Range Status   Specimen Description   Final    BLOOD RIGHT ANTECUBITAL Performed at [place] [address] [place], [place] [address]    Special Requests   Final    BOTTLES DRAWN AEROBIC AND ANAEROBIC Blood Culture adequate volume Performed at [place] [address] [place], [place] [address]    Culture   Final    NO GROWTH 4 DAYS Performed at [facility], [place] [address] [place], [place] [address]    Report Status PENDING  Incomplete  Culture, blood (routine x 2)     Status: None (Preliminary result)   Collection Time: 05/11/21  5:27 AM   Specimen: BLOOD  Result Value Ref Range Status   Specimen Description   Final    BLOOD LEFT ANTECUBITAL Performed at [facility], [place] [address], [place], [place] [address]    Special Requests   Final    BOTTLES DRAWN AEROBIC ONLY  Blood Culture adequate volume Performed at [place] [address] [place], [place] [address]    Culture   Final    NO GROWTH 3 DAYS Performed at [facility], [place] [address], [place], [place] [address]    Report Status PENDING  Incomplete    Radiology Studies: No results found.    Tyeasha Ebbs T. [place]  If 7PM-7AM, please contact night-coverage [email or web address] 05/14/2021, 7:16 PM

## 2021-05-15 ENCOUNTER — Ambulatory Visit: Payer: Medicare Other

## 2021-05-15 ENCOUNTER — Telehealth: Payer: Self-pay | Admitting: Internal Medicine

## 2021-05-15 ENCOUNTER — Ambulatory Visit: Payer: Medicare Other | Admitting: Radiation Oncology

## 2021-05-15 ENCOUNTER — Inpatient Hospital Stay (HOSPITAL_COMMUNITY): Payer: Medicare Other

## 2021-05-15 LAB — CBC
HCT: 29.5 % — ABNORMAL LOW (ref 39.0–52.0)
Hemoglobin: 10.2 g/dL — ABNORMAL LOW (ref 13.0–17.0)
MCH: 32.5 pg (ref 26.0–34.0)
MCHC: 34.6 g/dL (ref 30.0–36.0)
MCV: 93.9 fL (ref 80.0–100.0)
Platelets: 137 10*3/uL — ABNORMAL LOW (ref 150–400)
RBC: 3.14 MIL/uL — ABNORMAL LOW (ref 4.22–5.81)
RDW: 15.4 % (ref 11.5–15.5)
WBC: 1.7 10*3/uL — ABNORMAL LOW (ref 4.0–10.5)
nRBC: 0 % (ref 0.0–0.2)

## 2021-05-15 LAB — MAGNESIUM: Magnesium: 1.8 mg/dL (ref 1.7–2.4)

## 2021-05-15 LAB — CBC WITH DIFFERENTIAL/PLATELET
Abs Immature Granulocytes: 0 10*3/uL (ref 0.00–0.07)
Basophils Absolute: 0 10*3/uL (ref 0.0–0.1)
Basophils Relative: 1 %
Eosinophils Absolute: 0.1 10*3/uL (ref 0.0–0.5)
Eosinophils Relative: 6 %
HCT: 28.5 % — ABNORMAL LOW (ref 39.0–52.0)
Hemoglobin: 9.7 g/dL — ABNORMAL LOW (ref 13.0–17.0)
Immature Granulocytes: 0 %
Lymphocytes Relative: 24 %
Lymphs Abs: 0.5 10*3/uL — ABNORMAL LOW (ref 0.7–4.0)
MCH: 32.3 pg (ref 26.0–34.0)
MCHC: 34 g/dL (ref 30.0–36.0)
MCV: 95 fL (ref 80.0–100.0)
Monocytes Absolute: 0.3 10*3/uL (ref 0.1–1.0)
Monocytes Relative: 15 %
Neutro Abs: 1.2 10*3/uL — ABNORMAL LOW (ref 1.7–7.7)
Neutrophils Relative %: 54 %
Platelets: 137 10*3/uL — ABNORMAL LOW (ref 150–400)
RBC: 3 MIL/uL — ABNORMAL LOW (ref 4.22–5.81)
RDW: 15.4 % (ref 11.5–15.5)
WBC: 2.1 10*3/uL — ABNORMAL LOW (ref 4.0–10.5)
nRBC: 0 % (ref 0.0–0.2)

## 2021-05-15 LAB — CULTURE, BLOOD (ROUTINE X 2)
Culture: NO GROWTH
Special Requests: ADEQUATE

## 2021-05-15 LAB — RENAL FUNCTION PANEL
Albumin: 3.2 g/dL — ABNORMAL LOW (ref 3.5–5.0)
Anion gap: 7 (ref 5–15)
BUN: 8 mg/dL (ref 8–23)
CO2: 26 mmol/L (ref 22–32)
Calcium: 8.6 mg/dL — ABNORMAL LOW (ref 8.9–10.3)
Chloride: 107 mmol/L (ref 98–111)
Creatinine, Ser: 0.71 mg/dL (ref 0.61–1.24)
GFR, Estimated: >60 mL/min (ref >60)
Glucose, Bld: 151 mg/dL — ABNORMAL HIGH (ref 70–99)
Phosphorus: 2.9 mg/dL (ref 2.5–4.6)
Potassium: 3.7 mmol/L (ref 3.5–5.1)
Sodium: 140 mmol/L (ref 135–145)

## 2021-05-15 IMAGING — MR MR HEAD WO/W CM
13 series · 48 of 48 positions shown · IV contrast (gadavist)
Comparison: [DATE]

CLINICAL DATA: Adrenal insufficiency

EXAM:
MRI HEAD WITHOUT AND WITH CONTRAST
TECHNIQUE: Multiplanar, multiecho pulse sequences of the brain and surrounding
structures were obtained without and with intravenous contrast.
CONTRAST:  9mL GADAVIST GADOBUTROL 1 MMOL/ML IV SOLN

[Series 5: DWI · axial · 3.0mm · 1.36mm/px · z∈[-69,+84]mm · 7 of 108 slices shown (1 of 2)]
[im 1/108]
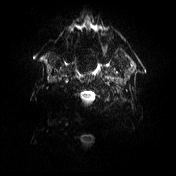
[im 18/108]
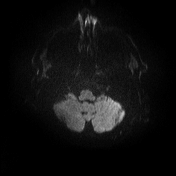
[im 36/108]
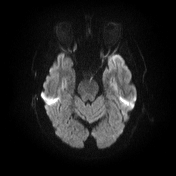
[im 54/108]
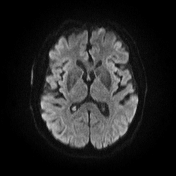
[im 72/108]
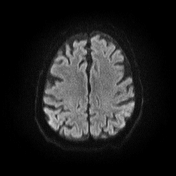
[im 90/108]
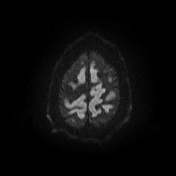
[im 108/108]
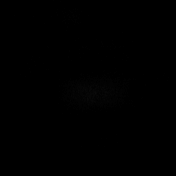

[Series 6: DWI · axial · 3.0mm · 1.36mm/px · z∈[-69,+81]mm · 3 of 53 slices shown (2 of 2)]
[im 1/53]
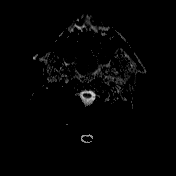
[im 27/53]
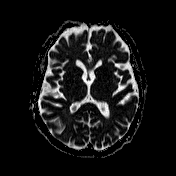
[im 53/53]
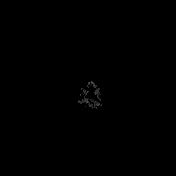

[Series 7: T1 · sagittal · 5.0mm · 0.75mm/px · 1 of 24 slices shown (1 of 2)]
[im 1/24]
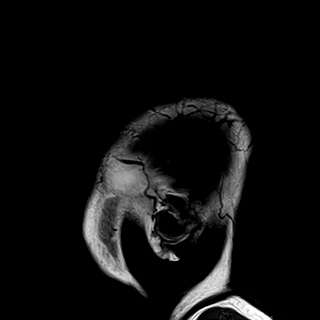

[Series 8: T2 · axial · 5.0mm · 0.62mm/px · 1 of 26 slices shown]
[im 1/26]
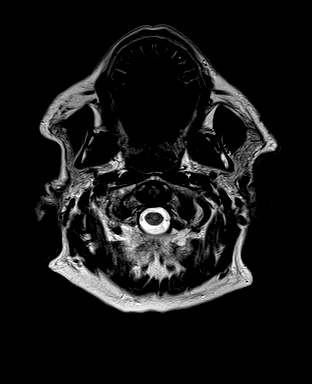

[Series 9: swi_images · axial · 3.0mm · 0.75mm/px · z∈[-75,+84]mm · 3 of 56 slices shown]
[im 1/56]
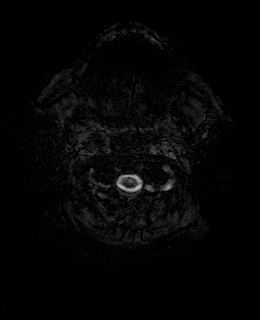
[im 28/56]
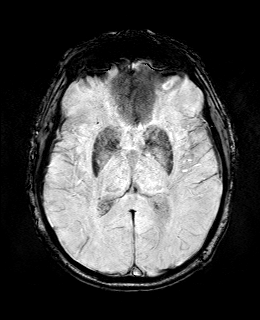
[im 56/56]
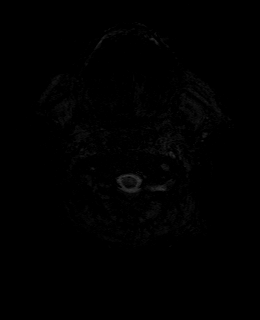

[Series 11: FLAIR · axial · 3.0mm · 0.75mm/px · z∈[-72,+81]mm · 3 of 54 slices shown]
[im 1/54]
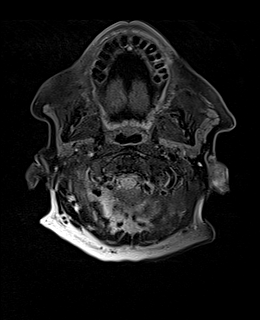
[im 27/54]
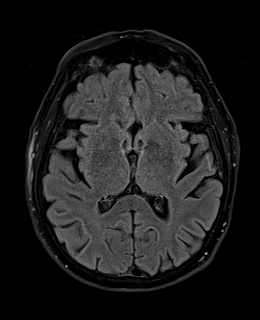
[im 54/54]
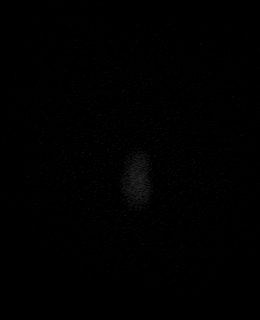

[Series 12: T1 · axial · 1.0mm · 0.94mm/px · z∈[-82,+88]mm · 10 of 176 slices shown (2 of 2)]
[im 1/176]
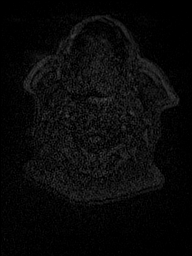
[im 20/176]
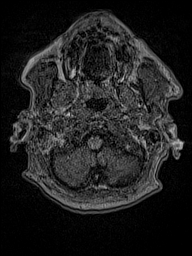
[im 39/176]
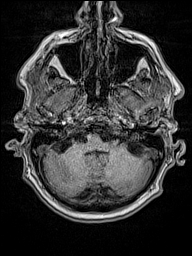
[im 59/176]
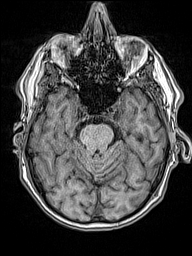
[im 78/176]
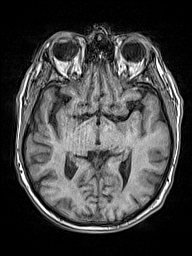
[im 98/176]
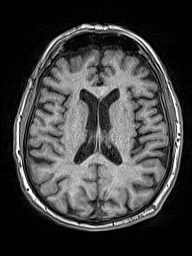
[im 117/176]
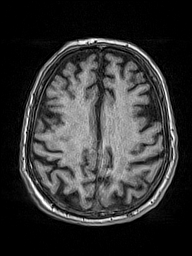
[im 137/176]
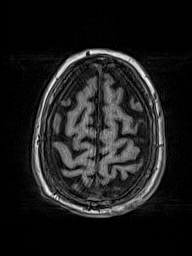
[im 156/176]
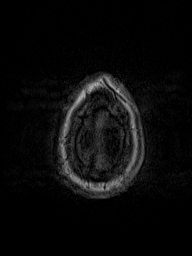
[im 176/176]
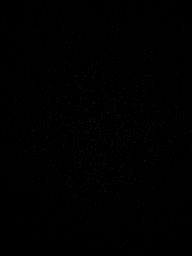

[Series 13: cor dwi_tracew · coronal · 5.0mm · 1.53mm/px · 3 of 54 slices shown]
[im 1/54]
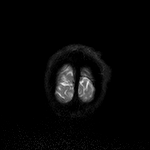
[im 27/54]
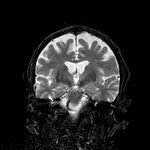
[im 54/54]
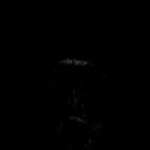

[Series 14: cor dwi_adc · coronal · 5.0mm · 1.53mm/px · 2 of 27 slices shown]
[im 1/27]
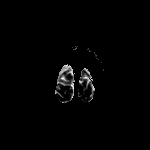
[im 27/27]
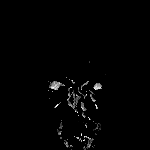

[Series 15: T2 post-contrast · coronal · 5.0mm · 0.57mm/px · 2 of 30 slices shown]
[im 1/30]
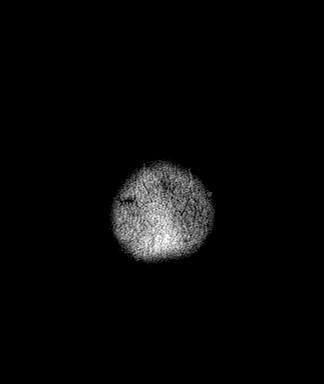
[im 30/30]
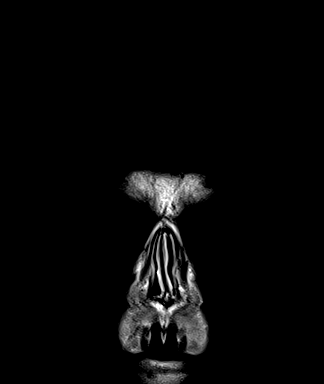

[Series 16: T1 post-contrast · axial · 1.0mm · 0.94mm/px · z∈[-82,+88]mm · 10 of 176 slices shown (1 of 3)]
[im 1/176]
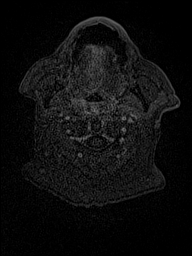
[im 20/176]
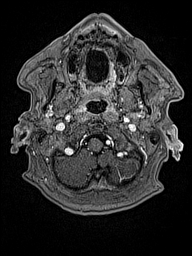
[im 39/176]
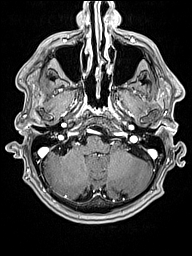
[im 59/176]
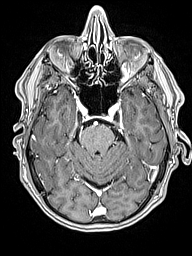
[im 78/176]
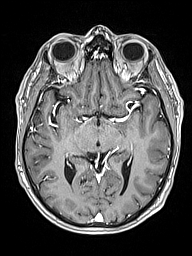
[im 98/176]
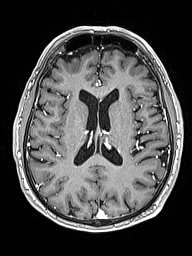
[im 117/176]
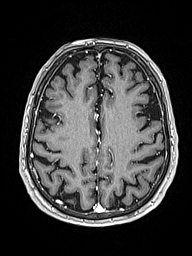
[im 137/176]
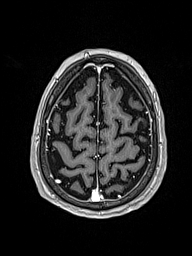
[im 156/176]
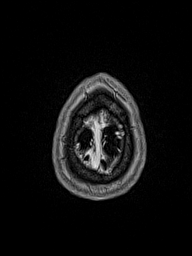
[im 176/176]
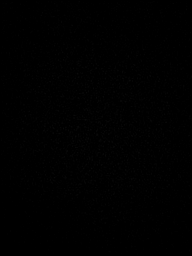

[Series 17: T1 post-contrast · coronal · 5.0mm · 0.43mm/px · 2 of 30 slices shown (2 of 3)]
[im 1/30]
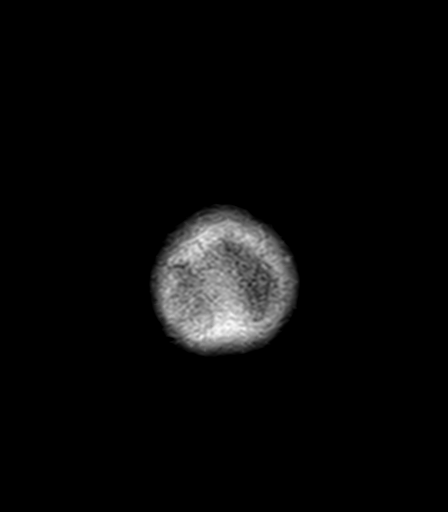
[im 30/30]
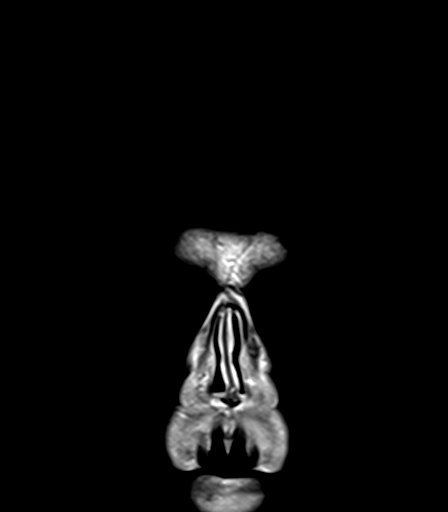

[Series 18: T1 post-contrast · sagittal · 5.0mm · 0.75mm/px · 1 of 24 slices shown (3 of 3)]
[im 1/24]
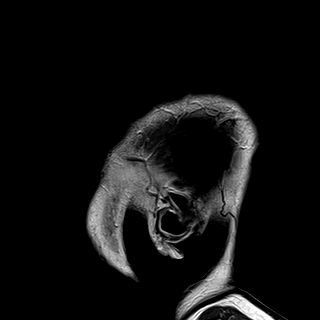

[48 of 48 positions shown; findings below may reference images not displayed]

FINDINGS: Brain: There is no acute infarction or intracranial hemorrhage.
There is no intracranial mass, mass effect, or edema. There is no
hydrocephalus or extra-axial fluid collection. Ventricles and sulci
are normal in size configuration. No abnormal enhancement.

Vascular: Major vessel flow voids at the skull base are preserved.

Skull and upper cervical spine: Normal marrow signal is preserved.

Sinuses/Orbits: Paranasal sinuses are aerated. Orbits are
unremarkable.

Other: Sella is unremarkable.  Mastoid air cells are clear.
IMPRESSION: No evidence of recent infarction, hemorrhage, mass. No abnormal
enhancement.

There is no sellar or suprasellar mass identified on this
nondedicated examination

## 2021-05-15 MED ORDER — GADOBUTROL 1 MMOL/ML IV SOLN
9.0000 mL | Freq: Once | INTRAVENOUS | Status: AC | PRN
  Administered 2021-05-15: 9 mL via INTRAVENOUS

## 2021-05-15 MED ORDER — TBO-FILGRASTIM 480 MCG/0.8ML ~~LOC~~ SOSY
480.0000 ug | PREFILLED_SYRINGE | Freq: Once | SUBCUTANEOUS | Status: AC
  Administered 2021-05-15: 480 ug via SUBCUTANEOUS
  Filled 2021-05-15: qty 0.8

## 2021-05-15 NOTE — Progress Notes (Signed)
I spoke with the patient to discuss we'd recommend resuming treatment today. He is not in agreement with this and is concerned his metabolic dysfunction has been the result of chemo or possibly radiation. While I don't think that radiation would be the source of his issues, I will ask Dr. Lisbeth Renshaw to review his case and hospital course. He is in agreement and otherwise plans to resume treatment Friday this week. While it would be better to have more than one treatment for the week, he is not in agreement and wants his blood work tomorrow to determine how he will proceed.  ?

## 2021-05-15 NOTE — Progress Notes (Signed)
Occupational Therapy Treatment ?Patient Details ?Name: Brandon Rubio. ?MRN: [id number] ?DOB: 1950/07/19 ?Today's Date: 05/15/2021 ? ? ?History of present illness 71 year old M with PMH of stage III NSCLC on chemoradiation, CAD, IDDM-2, HTN and thrombocytopenia presenting with ongoing generalized weakness, dizziness, poor p.o. intake and exertional dyspnea for over a week and half, and admitted for dehydration, orthostasis, hyponatremia, concern for euglycemic DKA and failure to thrive. ?  ?OT comments ? Patient reports improvement of blood pressure and is now asymptomatic. In the absence of orthostatic hypotension patient is now independent in room with mobility and ADLs. He was also able to ambulate in hall without a device. Therapist encouraged use of shower chair for safety in the future - in case of reoccurrence of dizziness or weakness in the future. Patient has met OT goals. No further OT needed.  ? ?Recommendations for follow up therapy are one component of a multi-disciplinary discharge planning process, led by the attending physician.  Recommendations may be updated based on patient status, additional functional criteria and insurance authorization. ?   ?Follow Up Recommendations ? No OT follow up  ?  ?Assistance Recommended at Discharge None  ?Patient can return home with the following ?   ?  ?Equipment Recommendations ? Tub/shower seat  ?  ?Recommendations for Other Services   ? ?  ?Precautions / Restrictions Precautions ?Precautions: Fall;Other (comment) ?Precaution Comments: orthostatic ?Restrictions ?Weight Bearing Restrictions: No  ? ? ?  ? ?Mobility Bed Mobility ?Overal bed mobility: Independent ?  ?  ?  ?  ?Sit to supine: Independent ?  ?  ?  ? ?Transfers ?  ?  ?  ?  ?  ?  ?  ?  ?  ?  ?  ?  ?Balance Overall balance assessment: No apparent balance deficits (not formally assessed) ?  ?  ?  ?  ?  ?  ?  ?  ?  ?  ?  ?  ?  ?  ?  ?  ?  ?  ?   ? ?ADL either performed or assessed with clinical judgement   ? ?ADL Overall ADL's : Independent ?  ?  ?  ?  ?  ?  ?  ?  ?  ?  ?  ?  ?  ?  ?  ?  ?  ?  ?Functional mobility during ADLs: Independent ?General ADL Comments: In the absence of orthostatic hypotension patient is now able to ambulate independently in room and hallway without a device. He is now able to perform all ADLs. Therapist encouraged use of shower chair for safety - for any reoccurance of dizziness/weakness. ?  ? ?Extremity/Trunk Assessment Upper Extremity Assessment ?Upper Extremity Assessment: Overall WFL for tasks assessed ?  ?  ?  ?  ?  ? ?Vision Patient Visual Report: No change from baseline ?  ?  ?Perception   ?  ?Praxis   ?  ? ?Cognition Arousal/Alertness: Awake/alert ?Behavior During Therapy: [facility] for tasks assessed/performed ?Overall Cognitive Status: Within Functional Limits for tasks assessed ?  ?  ?  ?  ?  ?  ?  ?  ?  ?  ?  ?  ?  ?  ?  ?  ?  ?  ?  ?   ?Exercises   ? ?  ?Shoulder Instructions   ? ? ?  ?General Comments    ? ? ?Pertinent Vitals/ Pain       Pain Assessment ?Pain Assessment: No/denies pain ? ?  Home Living   ?  ?  ?  ?  ?  ?  ?  ?  ?  ?  ?  ?  ?  ?  ?  ?  ?  ?  ? ?  ?Prior Functioning/Environment    ?  ?  ?  ?   ? ?Frequency ?    ? ? ? ? ?  ?Progress Toward Goals ? ?OT Goals(current goals can now be found in the care plan section) ? Progress towards OT goals: Goals met/education completed, patient discharged from OT ? ?   ?Plan All goals met and education completed, patient discharged from OT services   ? ?Co-evaluation ? ? ?   ?  ?  ?OT goals addressed during session: ADL's and self-care ?  ? ?  ?AM-PAC OT "6 Clicks" Daily Activity     ?Outcome Measure ? ? Help from another person eating meals?: None ?Help from another person taking care of personal grooming?: None ?Help from another person toileting, which includes using toliet, bedpan, or urinal?: None ?Help from another person bathing (including washing, rinsing, drying)?: None ?Help from another person to put on and taking off  regular upper body clothing?: None ?Help from another person to put on and taking off regular lower body clothing?: None ?6 Click Score: 24 ? ?  ?End of Session   ? ?  ?  ?Activity Tolerance Patient tolerated treatment well ?  ?Patient Left in bed ?  ?Nurse Communication Mobility status ?  ? ?   ? ?Time: 1848-5927 ?OT Time Calculation (min): 15 min ? ?Charges: OT General Charges ?$OT Visit: 1 Visit ?OT Treatments ?$Self Care/Home Management : 8-22 mins ? ?Brandon Rubio, OTR/L ?Acute Care Rehab Services  ?Office [phone] ?Pager: [phone]  ? ?Brandon Rubio ?05/15/2021, 1:20 PM ?

## 2021-05-15 NOTE — Progress Notes (Signed)
PROGRESS NOTE  Brandon Rubio. KCL:[id number] DOB: 17-Jul-1950 DOA: 05/10/2021 PCP: [name], MD  HPI/Recap of past 66 hours: 71 year old M with PMH of stage III NSCLC on chemoradiation, CAD, IDDM-2, HTN and thrombocytopenia presenting with ongoing generalized weakness, dizziness, poor p.o. intake and exertional dyspnea for over a week and half, and admitted for dehydration, orthostasis, hyponatremia, concern for euglycemic DKA and failure to thrive.  Previously improved with IV fluid in oncology office in ED and discharged home.   In ED, reportedly tachycardic to 130s but improved to 110s with IV fluid.  Normotensive.  CT head and CT abdomen and pelvis without acute finding.  CTA chest with decreased RLL mass and adjacent opacities.  Labs with mild hyponatremia and euglycemic DKA.  Has mild leukocytosis to 12.5.  Started on IV fluid and admitted.   TTE and carotid US without significant finding.  TSH normal.  A.m. cortisol low with some response to cosyntropin injection.  ACTH level very low.  Remaining orthostatic with positive orthostatic vitals.  Started on midodrine and p.o. Cortef.  MRI brain ordered.   Therapy recommended home health and DME.   05/15/2021: Saw patient, preparing to go for his MRI brain, no new symptoms.  Denies dizziness.  Negative orthostatic vital signs on 05/15/2021.  Persistent pancytopenia and worsening leukopenia.  Medical oncology consulted.  Repeating CBC with differentials.    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  Orthostatic vitals positive initially.  Repeated orthostatic vital signs negative on 05/15/2021.  Patient denies dizziness.   He has no focal neurodeficit, nystagmus or dysmetria.  CT head, TTE and carotid US without significant finding.  TSH normal.  Blood cultures NGTD.  A.m. cortisol low with minimal response after cosyntropin injection.  ACTH low.  Patient denies recent steroid use -Increased midodrine to 10 mg 3 times daily on 3/5 -Started p.o. Cortef 15 mg in the morning and 10 mg in the afternoon on 3/6 -Decreased home metoprolol to 12.5 mg twice daily MRI brain 05/15/2021 benign, no acute intracranial findings. MRI brain ordered to evaluate for secondary adrenal insufficiency -Discontinue Jardiance on discharge -PT/OT, TED hose, elevate head of bed above 30 degree -Add abdominal binder   Low serum cortisol level Concern for adrenal insufficiency.  ACTH low as well.  However, minimal response to cosyntropin. -P.o. Cortef as above. -MRI brain negative.  Pancytopenia likely secondary to chemotherapy Worsening leukopenia Repeat CBC with differentials. Medical oncology consulted   Dehydration Continue to encourage oral hydration.   Resolved metabolic acidosis Initially some concern about euglycemic DKA but remains acidotic despite gap closure.  Likely from IV fluid.  Resolved. -Discontinue sodium bicarbonate   FTT (failure to thrive) in adult Nutrition Status: Nutrition Problem: Increased nutrient needs Etiology: cancer and cancer related treatments Signs/Symptoms: estimated needs Interventions: Ensure Enlive (each supplement provides 350kcal and 20 grams of protein), MVI, Prostat   Resolved dyspnea on exertion BNP and echocardiogram  reassuring.  Likely  deconditioning Continue PT OT with assistance and fall precautions.   Insulin dependent type 2 diabetes mellitus ([place]) Controlled.  DKA ruled out.  Acidosis and ketonuria likely from dehydration.   A1c 5.9%.  BMP glucose normal. -Initiate CBG monitoring if blood glucose elevated. -Hold home insulin, Jardiance and metformin. -Agree with discontinuing Jardiance on discharge Avoid hypoglycemia  Resolved post repletion: Hypokalemia/hypomagnesemia/hypophosphatemia Potassium 3.7, phosphorus 2.9, magnesium 1.8.   Resolved hyponatremia Could be from dehydration.  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  Also possible adrenal insufficiency.  Resolved -P.o. Cortef as above   Adenocarcinoma of right lung, stage 3 ([place]) On chemoradiation.  -Per oncology.   Physical deconditioning Continue PT/OT with fall precautions.   CAD (coronary artery disease) Denies history of stent or CABG.  No chest pain.  TTE reassuring. -Continue low-dose metoprolol   Increased nutrient needs Body mass index is 27.26 kg/m. Nutrition Problem: Increased nutrient needs Etiology: cancer and cancer related treatments Signs/Symptoms: estimated needs Interventions: Ensure Enlive (each supplement provides 350kcal and 20 grams of protein), MVI, Prostat DVT prophylaxis:  Place TED hose Start: 05/11/21 0932 SCDs Start: 05/10/21 1920   Code Status: Full code Family Communication: None at bedside.     Final disposition: Likely discharge to home on 05/16/2021 or when okay with medical oncology.       Consultants:  Medical oncology, Dr. Burr Medico.     Status is: Inpatient Patient requires at least 2 midnights for further evaluation and treatment of present condition.    Objective: Vitals:   05/15/21 0820 05/15/21 0822 05/15/21 0825 05/15/21 1336  BP: 117/78 107/80 102/73 115/77  Pulse: (!) 101 (!) 126 (!) 135 88  Resp:    18  Temp:    (!) 97.5 F (36.4 C)  TempSrc:    Oral   SpO2: 95% 97% 97% 93%  Weight:      Height:        Intake/Output Summary (Last 24 hours) at 05/15/2021 1412 Last data filed at 05/15/2021 1334 Gross per 24 hour  Intake 1127.86 ml  Output 1225 ml  Net -97.14 ml   Filed Weights   05/10/21 1005  Weight: 91.2 kg    Exam:  General: 71 y.o. year-old male well developed well nourished in no acute distress.  Alert and oriented x3. Cardiovascular: Regular rate and rhythm with no rubs or gallops.  No thyromegaly or JVD noted.   Respiratory: Clear to auscultation with no wheezes or rales. Good inspiratory effort. Abdomen: Soft nontender nondistended with normal bowel sounds x4 quadrants. Musculoskeletal: No lower extremity edema. 2/4 pulses in all 4 extremities. Skin: No ulcerative lesions noted or rashes, Psychiatry: Mood is appropriate for condition and setting Neuro: Moves all 4 extremities.  Nonfocal exam.   Data Reviewed: CBC: Recent Labs  Lab 05/10/21 1016 05/11/21 0527 05/12/21 0757 05/13/21 0712 05/14/21 0755 05/15/21 0553  WBC 2.5* 1.8* 1.7* 1.8* 2.1* 1.7*  NEUTROABS 1.5* 1.0*  --   --   --   --   HGB 13.1 10.0* 9.5* 9.7* 9.7* 10.2*  HCT 38.3* 29.7* 28.2* 27.6* 27.2* 29.5*  MCV 94.8 95.5 95.3 92.9 92.8 93.9  PLT 99* 90* 98* 116* 138* 779*   Basic Metabolic Panel: Recent Labs  Lab 05/11/21 0527 05/12/21 0757 05/13/21 0712 05/14/21 0755 05/15/21 0553  NA 130* 133* 136 137 140  K 3.6 3.7 3.5 3.1* 3.7  CL 107 108 107 103 107  CO2 13* 15* 20* 26 26  GLUCOSE 91 84 95 104* 151*  BUN 8 <5* <5* 6* 8  CREATININE 0.73 0.59* 0.64 0.68 0.71  CALCIUM 8.2* 7.7* 8.3* 8.2* 8.6*  MG 1.5* 1.5* 1.6* 1.5* 1.8  PHOS  --  1.8* 2.1* 2.3* 2.9   GFR: Estimated Creatinine Clearance: 90.3 mL/min (by C-G formula based on SCr of 0.71 mg/dL). Liver Function Tests: Recent Labs  Lab 05/10/21 1016 05/12/21 0757 05/13/21 0712 05/14/21 0755 05/15/21 0553  AST 17  --   --   --   --   ALT 14  --   --   --   --   ALKPHOS 64  --    --   --   --   BILITOT 0.8  --   --   --   --   PROT 8.0  --   --   --   --   ALBUMIN 4.1 3.0* 3.1* 3.1* 3.2*   No results for input(s): LIPASE, AMYLASE in the last 168 hours. No results for input(s): AMMONIA in the last 168 hours. Coagulation Profile: Recent Labs  Lab 05/10/21 1016  INR 1.1   Cardiac Enzymes: No results for input(s): CKTOTAL, CKMB, CKMBINDEX, TROPONINI in the last 168 hours. BNP (last 3 results) No results for input(s): PROBNP in the last 8760 hours. HbA1C: No results for input(s): HGBA1C in the last 72 hours. CBG: No results for input(s): GLUCAP in the last 168 hours. Lipid Profile: No results for input(s): CHOL, HDL, LDLCALC, TRIG, CHOLHDL, LDLDIRECT in the last 72 hours. Thyroid Function Tests: No results for input(s): TSH, T4TOTAL, FREET4, T3FREE, THYROIDAB in the last 72 hours. Anemia Panel: No results for input(s): VITAMINB12, FOLATE, FERRITIN, TIBC, IRON, RETICCTPCT in the last 72 hours. Urine analysis:    Component Value Date/Time   COLORURINE YELLOW 05/10/2021 1016   APPEARANCEUR CLEAR 05/10/2021 1016   LABSPEC 1.025 05/10/2021 1016   PHURINE 5.0 05/10/2021 1016   GLUCOSEU 150 (A) 05/10/2021 1016   HGBUR NEGATIVE 05/10/2021 1016   BILIRUBINUR NEGATIVE 05/10/2021 1016   KETONESUR 80 (A) 05/10/2021 1016   PROTEINUR 30 (A) 05/10/2021 1016   UROBILINOGEN 0.2 12/11/2007 2313   NITRITE NEGATIVE 05/10/2021 1016   LEUKOCYTESUR NEGATIVE 05/10/2021 1016   Sepsis Labs: @LABRCNTIP (procalcitonin:4,lacticidven:4)  ) Recent Results (from the past 240 hour(s))  Resp Panel by RT-PCR (Flu A&B, Covid) Nasopharyngeal Swab     Status: None   Collection Time: 05/06/21 12:26 PM   Specimen: Nasopharyngeal Swab; Nasopharyngeal(NP) swabs in vial transport medium  Result Value Ref Range Status   SARS Coronavirus 2 by RT PCR NEGATIVE NEGATIVE Final    Comment: (NOTE) SARS-CoV-2 target nucleic acids are NOT DETECTED.  The SARS-CoV-2 RNA is generally detectable in  upper respiratory specimens during the acute phase of infection. The lowest concentration of SARS-CoV-2 viral copies this assay can detect is 138 copies/mL. A negative result does not preclude SARS-Cov-2 infection and should not be used as the sole basis for treatment or other patient management decisions. A negative result may occur with  improper specimen collection/handling, submission of specimen other than nasopharyngeal swab, presence of viral mutation(s) within the areas targeted by this assay, and inadequate number of viral copies(<138 copies/mL). A negative result must be combined with clinical observations, patient history, and epidemiological information. The expected result is Negative.  Fact Sheet for Patients:  EntrepreneurPulse.com.au  Fact Sheet for Healthcare Providers:  IncredibleEmployment.be  This test is no t yet approved or cleared by the [place] and  has been authorized for detection and/or diagnosis of SARS-CoV-2 by FDA under an Emergency Use Authorization (EUA). This EUA will remain  in effect (meaning this test can be used) for the duration of the COVID-19 declaration under Section 564(b)(1) of the Act, 21 U.S.C.section 360bbb-3(b)(1), unless the authorization is terminated  or revoked sooner.       Influenza A by PCR NEGATIVE NEGATIVE Final   Influenza B by PCR NEGATIVE NEGATIVE Final    Comment: (NOTE) The Xpert Xpress SARS-CoV-2/FLU/RSV plus assay is intended as an aid in the diagnosis of influenza from Nasopharyngeal swab specimens and should not be used as a sole basis for treatment. Nasal washings and aspirates are unacceptable for Xpert Xpress SARS-CoV-2/FLU/RSV testing.  Fact Sheet for Patients: EntrepreneurPulse.com.au  Fact Sheet for Healthcare Providers: IncredibleEmployment.be  This test is not yet approved or cleared by the [place] FDA and has been  authorized for detection and/or diagnosis of SARS-CoV-2 by FDA under an Emergency Use Authorization (EUA). This EUA will remain in effect (meaning this test can be used) for the duration of the COVID-19 declaration under Section 564(b)(1) of the Act, 21 U.S.C. section 360bbb-3(b)(1), unless the authorization is terminated or revoked.  Performed at [facility], [place] [address], [place], [place] [address]   Resp Panel by RT-PCR (Flu A&B, Covid) Nasopharyngeal Swab     Status: None   Collection Time: 05/10/21 10:16 AM   Specimen: Nasopharyngeal Swab; Nasopharyngeal(NP) swabs in vial transport medium  Result Value Ref Range Status   SARS Coronavirus 2 by RT PCR NEGATIVE NEGATIVE Final    Comment: (NOTE) SARS-CoV-2 target nucleic acids are NOT DETECTED.  The SARS-CoV-2 RNA is generally detectable in upper respiratory specimens during the acute phase of infection. The lowest concentration of SARS-CoV-2 viral copies this assay can detect is 138 copies/mL. A negative result does not preclude SARS-Cov-2 infection and should not be used as the sole basis for treatment or other patient management decisions. A negative result may occur with  improper specimen collection/handling, submission of specimen other than nasopharyngeal swab, presence of viral mutation(s) within the areas targeted by this assay, and inadequate number of viral copies(<138 copies/mL). A negative result must be combined with clinical observations, patient history, and epidemiological information. The expected result is Negative.  Fact Sheet for Patients:  EntrepreneurPulse.com.au  Fact Sheet for Healthcare Providers:  IncredibleEmployment.be  This test is no t yet approved or cleared by the [place] FDA and  has been authorized for detection and/or diagnosis of SARS-CoV-2 by FDA under an Emergency Use Authorization (EUA). This EUA will remain  in effect  (meaning this test can be used) for the duration of the COVID-19 declaration under Section 564(b)(1) of the Act, 21 U.S.C.section 360bbb-3(b)(1), unless the authorization is terminated  or revoked sooner.       Influenza A by PCR NEGATIVE NEGATIVE Final   Influenza B by PCR NEGATIVE NEGATIVE Final    Comment: (NOTE) The Xpert Xpress SARS-CoV-2/FLU/RSV plus assay is intended as an aid in the diagnosis of influenza from Nasopharyngeal swab specimens and should not be used as a sole basis for treatment. Nasal washings and aspirates are unacceptable for Xpert Xpress SARS-CoV-2/FLU/RSV testing.  Fact Sheet for Patients: EntrepreneurPulse.com.au  Fact Sheet for Healthcare Providers: IncredibleEmployment.be  This test is not yet approved or cleared by the [place] FDA and has been authorized for detection and/or diagnosis of SARS-CoV-2 by FDA under an Emergency Use Authorization (EUA). This EUA will remain in  effect (meaning this test can be used) for the duration of the COVID-19 declaration under Section 564(b)(1) of the Act, 21 U.S.C. section 360bbb-3(b)(1), unless the authorization is terminated or revoked.  Performed at [facility], [place] [address] [place], [place] [address]   Culture, blood (routine x 2)     Status: None   Collection Time: 05/10/21 10:17 AM   Specimen: BLOOD  Result Value Ref Range Status   Specimen Description   Final    BLOOD RIGHT ANTECUBITAL Performed at [facility] [address] [place] [address], [place], [place] [address]    Special Requests   Final    BOTTLES DRAWN AEROBIC AND ANAEROBIC Blood Culture adequate volume Performed at [facility] [address], [place], [place] [address]    Culture   Final    NO GROWTH 5 DAYS Performed at [facility], [place] [address] [place], [place] [address]    Report Status 05/15/2021 FINAL  Final  Culture, blood (routine  x 2)     Status: None (Preliminary result)   Collection Time: 05/11/21  5:27 AM   Specimen: BLOOD  Result Value Ref Range Status   Specimen Description   Final    BLOOD LEFT ANTECUBITAL Performed at [facility], [place] [address] [place], [place] [address]    Special Requests   Final    BOTTLES DRAWN AEROBIC ONLY Blood Culture adequate volume Performed at [facility] [address], [place], [place] [address]    Culture   Final    NO GROWTH 4 DAYS Performed at [facility], [place] [address] [place],  [address]    Report Status PENDING  Incomplete      Studies: MR BRAIN W WO CONTRAST  Result Date: 05/15/2021 CLINICAL DATA:  Adrenal insufficiency EXAM: MRI HEAD WITHOUT AND WITH CONTRAST TECHNIQUE: Multiplanar, multiecho pulse sequences of the brain and surrounding structures were obtained without and with intravenous contrast. CONTRAST:  60mL GADAVIST GADOBUTROL 1 MMOL/ML IV SOLN COMPARISON:  01/01/2021 FINDINGS: Brain: There is no acute infarction or intracranial hemorrhage. There is no intracranial mass, mass effect, or edema. There is no hydrocephalus or extra-axial fluid collection. Ventricles and sulci are normal in size configuration. No abnormal enhancement. Vascular: Major vessel flow voids at the skull base are preserved. Skull and upper cervical spine: Normal marrow signal is preserved. Sinuses/Orbits: Paranasal sinuses are aerated. Orbits are unremarkable. Other: Sella is unremarkable.  Mastoid air cells are clear. IMPRESSION: No evidence of recent infarction, hemorrhage, mass. No abnormal enhancement. There is no sellar or suprasellar mass identified on this nondedicated examination Electronically Signed   By: Macy Mis M.D.   On: 05/15/2021 11:16    Scheduled Meds:  aspirin EC  81 mg Oral q AM   feeding supplement  237 mL Oral TID BM   folic acid  1 mg Oral Daily   hydrocortisone  15 mg Oral Q breakfast   And   hydrocortisone  10 mg  Oral QPM   metoprolol tartrate  12.5 mg Oral BID   midodrine  10 mg Oral TID WC   multivitamin with minerals  1 tablet Oral Daily   pantoprazole  40 mg Oral Daily   simvastatin  40 mg Oral q1800   sodium bicarbonate  650 mg Oral TID    Continuous Infusions:   LOS: 4 days     Kayleen Memos, MD [facility] Pager [phone]  If 7PM-7AM, please  contact night-coverage [email or web address] Password [email or web address] 05/15/2021, 2:12 PM

## 2021-05-15 NOTE — Telephone Encounter (Signed)
Dr.Carole Hall calls today in regards to PT. PT will be leaving the hospital tomorrow and we have gotten them set up with a follow up appointment for Monday (3/13 at 10:00) . Dr.Hall would like to speak to Dr.Crawford about setting up PT with an endocrinologist within the [facility] due to recent ailment. ? ?CB: [phone] ?

## 2021-05-15 NOTE — Progress Notes (Signed)
Physical Therapy Treatment ?Patient Details ?Name: Brandon Rubio. ?MRN: [id number] ?DOB: 06-Feb-1951 ?Today's Date: 05/15/2021 ? ? ?History of Present Illness 71 year old M with PMH of stage III NSCLC on chemoradiation, CAD, IDDM-2, HTN and thrombocytopenia presenting with ongoing generalized weakness, dizziness, poor p.o. intake and exertional dyspnea for over a week and half, and admitted for dehydration, orthostasis, hyponatremia, concern for euglycemic DKA and failure to thrive. ? ?  ?PT Comments  ? ? Pt did well today and able to ambulate in the hallway with a RW with no complaints of dizziness. Two small episodes of knee buckeling, but he self- corrected.   ?Recommendations for follow up therapy are one component of a multi-disciplinary discharge planning process, led by the attending physician.  Recommendations may be updated based on patient status, additional functional criteria and insurance authorization. ? ?Follow Up Recommendations ? Other (comment) (declined Halfway services) ?  ?  ?Assistance Recommended at Discharge None  ?Patient can return home with the following Assistance with cooking/housework ?  ?Equipment Recommendations ? Rolling walker (2 wheels) (has been delivered)  ?  ?Recommendations for Other Services   ? ? ?  ?Precautions / Restrictions Precautions ?Precautions: Fall;Other (comment)  ?  ? ?Mobility ? Bed Mobility ?Overal bed mobility: Modified Independent ?  ?  ?  ?  ?  ?  ?  ?  ? ?Transfers ?Overall transfer level: Modified independent ?Equipment used: Rolling walker (2 wheels) ?Transfers: Sit to/from Stand ?Sit to Stand: Modified independent (Device/Increase time) ?  ?  ?  ?  ?  ?  ?  ? ?Ambulation/Gait ?Ambulation/Gait assistance: Min guard ?Gait Distance (Feet): 325 Feet ?Assistive device: Rolling walker (2 wheels) ?Gait Pattern/deviations: Step-through pattern, Knees buckling ?  ?  ?  ?General Gait Details: Ambulated in the hallway with two episodes of R knee slight buckle, but able  to self correct.  No c/o dizziness. ? ? ?Stairs ?  ?  ?  ?  ?  ? ? ?Wheelchair Mobility ?  ? ?Modified Rankin (Stroke Patients Only) ?  ? ? ?  ?Balance   ?  ?  ?  ?  ?  ?  ?  ?  ?  ?  ?  ?  ?  ?  ?  ?  ?  ?  ?  ? ?  ?Cognition Arousal/Alertness: Awake/alert ?Behavior During Therapy: [facility] for tasks assessed/performed ?Overall Cognitive Status: Within Functional Limits for tasks assessed ?  ?  ?  ?  ?  ?  ?  ?  ?  ?  ?  ?  ?  ?  ?  ?  ?  ?  ?  ? ?  ?Exercises   ? ?  ?General Comments   ?  ?  ? ?Pertinent Vitals/Pain Pain Assessment ?Pain Assessment: No/denies pain  ? ? ?Home Living   ?  ?  ?  ?  ?  ?  ?  ?  ?  ?   ?  ?Prior Function    ?  ?  ?   ? ?PT Goals (current goals can now be found in the care plan section) Acute Rehab PT Goals ?Potential to Achieve Goals: Good ?Progress towards PT goals: Progressing toward goals ? ?  ?Frequency ? ? ? Min 3X/week ? ? ? ?  ?PT Plan Current plan remains appropriate  ? ? ?Co-evaluation   ?  ?  ?  ?  ? ?  ?AM-PAC PT "6 Clicks" Mobility   ?Outcome  Measure ? Help needed turning from your back to your side while in a flat bed without using bedrails?: None ?Help needed moving from lying on your back to sitting on the side of a flat bed without using bedrails?: None ?Help needed moving to and from a bed to a chair (including a wheelchair)?: A Little ?Help needed standing up from a chair using your arms (e.g., wheelchair or bedside chair)?: None ?Help needed to walk in hospital room?: A Little ?Help needed climbing 3-5 steps with a railing? : A Little ?6 Click Score: 21 ? ?  ?End of Session Equipment Utilized During Treatment: Gait belt ?Activity Tolerance: Patient tolerated treatment well ?Patient left: in bed;with call bell/phone within reach;with nursing/sitter in room ?Nurse Communication: Mobility status ?PT Visit Diagnosis: Difficulty in walking, not elsewhere classified (R26.2);Dizziness and giddiness (R42) ?  ? ? ?Time: 2800-3491 ?PT Time Calculation (min) (ACUTE ONLY): 14  min ? ?Charges:  $Gait Training: 8-22 mins          ?          ? ?Santiago Glad L. Tamala Julian, PT ? ?05/15/2021 ? ? ? ?Brandon Rubio ?05/15/2021, 11:38 AM ? ?

## 2021-05-16 ENCOUNTER — Ambulatory Visit: Payer: Medicare Other | Admitting: Radiation Oncology

## 2021-05-16 ENCOUNTER — Ambulatory Visit: Payer: Medicare Other

## 2021-05-16 LAB — CBC WITH DIFFERENTIAL/PLATELET
Abs Immature Granulocytes: 0.05 10*3/uL (ref 0.00–0.07)
Basophils Absolute: 0 10*3/uL (ref 0.0–0.1)
Basophils Relative: 1 %
Eosinophils Absolute: 0.2 10*3/uL (ref 0.0–0.5)
Eosinophils Relative: 3 %
HCT: 29.6 % — ABNORMAL LOW (ref 39.0–52.0)
Hemoglobin: 10.1 g/dL — ABNORMAL LOW (ref 13.0–17.0)
Immature Granulocytes: 1 %
Lymphocytes Relative: 19 %
Lymphs Abs: 0.9 10*3/uL (ref 0.7–4.0)
MCH: 32.8 pg (ref 26.0–34.0)
MCHC: 34.1 g/dL (ref 30.0–36.0)
MCV: 96.1 fL (ref 80.0–100.0)
Monocytes Absolute: 0.6 10*3/uL (ref 0.1–1.0)
Monocytes Relative: 13 %
Neutro Abs: 2.8 10*3/uL (ref 1.7–7.7)
Neutrophils Relative %: 63 %
Platelets: 139 10*3/uL — ABNORMAL LOW (ref 150–400)
RBC: 3.08 MIL/uL — ABNORMAL LOW (ref 4.22–5.81)
RDW: 15.6 % — ABNORMAL HIGH (ref 11.5–15.5)
WBC: 4.5 10*3/uL (ref 4.0–10.5)
nRBC: 0 % (ref 0.0–0.2)

## 2021-05-16 LAB — CULTURE, BLOOD (ROUTINE X 2)
Culture: NO GROWTH
Special Requests: ADEQUATE

## 2021-05-16 MED ORDER — MIDODRINE HCL 10 MG PO TABS: 10.0000 mg | ORAL_TABLET | Freq: Three times a day (TID) | ORAL | 0 refills | Status: DC

## 2021-05-16 MED ORDER — FREESTYLE LITE TEST VI STRP: ORAL_STRIP | 0 refills | Status: DC

## 2021-05-16 MED ORDER — HYDROCORTISONE 5 MG PO TABS: 15.0000 mg | ORAL_TABLET | Freq: Every day | ORAL | 0 refills | Status: DC

## 2021-05-16 MED ORDER — METOPROLOL TARTRATE 25 MG PO TABS: 12.5000 mg | ORAL_TABLET | Freq: Two times a day (BID) | ORAL | 0 refills | Status: DC

## 2021-05-16 MED ORDER — HYDROCORTISONE 10 MG PO TABS: 10.0000 mg | ORAL_TABLET | Freq: Every evening | ORAL | 0 refills | Status: DC

## 2021-05-16 NOTE — Progress Notes (Signed)
Patient discharged to home with family. Discharge instructions reviewed with patient who verbalized understanding.  ?

## 2021-05-16 NOTE — Telephone Encounter (Signed)
It looks like they have already done this referral and patient is already scheduled with endo. Let me know if anything further is needed.  ?

## 2021-05-16 NOTE — Discharge Summary (Signed)
Discharge Summary  Brandon Rubio. SWN:[id number] DOB: 08-Oct-1950  PCP: [name], MD  Admit date: 05/10/2021 Discharge date: 05/16/2021  Time spent: 35 minutes.  Recommendations for Outpatient Follow-up:  Keep your endocrinologist's appointment. Keep your PCP's appointment. Take your medications as prescribed. Continue fall precautions.  Discharge Diagnoses:  Active Hospital Problems   Diagnosis Date Noted   Orthostatic hypotension 05/10/2021    Priority: High   Low serum cortisol level 05/11/2021    Priority: Medium    Dehydration 03/07/2021    Priority: Medium    Metabolic acidosis 93/81/8299    Priority: Low   FTT (failure to thrive) in adult 05/10/2021    Priority: Low   Dyspnea on exertion 03/07/2021    Priority: Low   Insulin dependent type 2 diabetes mellitus ([place]) 12/10/2009    Priority: Low   Hypophosphatemia 05/12/2021   Hypokalemia/hypomagnesemia/hypophosphatemia 05/11/2021   Hypomagnesemia 05/11/2021   Pancytopenia ([place]) 05/11/2021   Hyponatremia 03/07/2021   Adenocarcinoma of right lung, stage 3 ([place]) 12/24/2020   Physical deconditioning 08/24/2012   CAD (coronary artery disease) 03/20/2008    Resolved Hospital Problems  No resolved problems to display.    Discharge Condition: Stable.  Diet recommendation: Resume previous diet.  Vitals:   05/16/21 0553 05/16/21 0900  BP: (!) 110/57 110/65  Pulse: 94 100  Resp: 18   Temp: 98.2 F (36.8 C) 97.7 F (36.5 C)  SpO2: 93% 94%    History of present illness:   71 year old M with PMH of stage III NSCLC on chemoradiation, CAD, IDDM-2, HTN and thrombocytopenia presenting with ongoing generalized weakness, dizziness, poor p.o. intake and exertional dyspnea for over a week and half, and admitted for dehydration, orthostasis, hyponatremia, and failure to thrive.     In ED, reportedly tachycardic to 130s but improved to 110s with IV fluid.  CT head and CT abdomen and pelvis without acute  finding.  CTA chest with decreased RLL mass and adjacent opacities.     TTE and carotid US without significant finding.  TSH normal.  A.m. cortisol low with some response to cosyntropin injection.  ACTH level very low.  Initially orthostatic vital signs positive.  Started on midodrine and p.o. Cortef.  MRI brain negative for any intracranial abnormalities.  Obstacles complicated by pancytopenia with leukocytosis of 1.7, received a dose of Granix by medical oncology, Dr. Julien Nordmann.   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   Orthostatic vitals positive initially.  Repeated orthostatic vital signs negative on 05/15/2021.  Per patient his dizziness has resolved. He has no focal neurodeficit, nystagmus or dysmetria.  CT head, TTE and carotid US without significant finding.  TSH normal.  Blood cultures NGTD.  A.m. cortisol low with minimal response after cosyntropin injection.  ACTH low.  Patient denies recent steroid use -Increased midodrine to 10 mg 3 times daily on 3/5 -Started p.o. Cortef 15 mg in the morning and 10 mg  in the afternoon on 3/6 -Decreased home metoprolol to 12.5 mg twice daily MRI brain 05/15/2021 benign, no acute intracranial findings. -PT/OT, TED hose, elevate head of bed above 30 degree Follow-up with endocrinology, Dr. Cruzita Lederer, outpatient.  Please keep your  appointment.   Low serum cortisol level Concern for adrenal insufficiency.  ACTH low as well.  However, minimal response to cosyntropin. -P.o. Cortef as above. -MRI brain negative for any acute intracranial abnormality.   Resolved post Granix administration: Pancytopenia likely secondary to chemotherapy Please follow-up with medical oncology Dr. Julien Nordmann.   Resolved dehydration post IV fluid hydration Continue to encourage oral hydration.   Resolved metabolic acidosis   FTT (failure to thrive) in adult Nutrition Status: Nutrition Problem: Increased nutrient needs Etiology: cancer and cancer related treatments Signs/Symptoms: estimated needs Interventions: Ensure Enlive (each supplement provides 350kcal and 20 grams of protein), MVI, Prostat Continue to encourage increase in oral protein calorie intake.   Resolved dyspnea on exertion BNP and echocardiogram reassuring.  Likely deconditioning   Prediabetes A1c 5.9%.   Restart home metformin Follow-up with endocrinology.   Resolved post repletion: Hypokalemia/hypomagnesemia/hypophosphatemia Potassium 3.7, phosphorus 2.9, magnesium 1.8.   Resolved hyponatremia -P.o. Cortef as above   Adenocarcinoma of right lung, stage 3 ([place]) On chemoradiation.  Follow-up with oncology.   CAD (coronary artery disease) No anginal symptoms TTE reassuring. -Continue low-dose metoprolol    Code Status: Full code   Discharge Exam: BP 110/65 (BP Location: Right Arm)    Pulse 100    Temp 97.7 F (36.5 C) (Oral)    Resp 18    Ht 6' (1.829 m)    Wt 91.2 kg    SpO2 94%    BMI 27.26 kg/m  General: 71 y.o. year-old male well developed well nourished in no acute distress.  Alert and oriented x3. Cardiovascular: Regular rate and rhythm with no rubs or gallops.  No thyromegaly or JVD noted.   Respiratory: Clear to auscultation with no wheezes or rales. Good inspiratory effort. Abdomen: Soft nontender nondistended with normal bowel sounds x4  quadrants. Musculoskeletal: No lower extremity edema. 2/4 pulses in all 4 extremities. Skin: No ulcerative lesions noted or rashes, Psychiatry: Mood is appropriate for condition and setting  Discharge Instructions You were cared for by a hospitalist during your hospital stay. If you have any questions about your discharge medications or the care you received while you were in the hospital after you are discharged, you can call the unit and asked to speak with the hospitalist on call if the hospitalist that took care of you is not available. Once you are discharged, your primary care physician will handle any further medical issues. Please note that NO REFILLS for any discharge medications will be authorized once you are discharged, as it is imperative that you return to your primary care physician (or establish a relationship with a primary care physician if you do not have one) for your aftercare needs so that they can reassess your need for medications and monitor your lab values.   Allergies as of 05/16/2021       Reactions   Penicillins Anaphylaxis, Swelling   THE THROAT SWELLS!!!! Because of a history of documented adverse serious drug reaction;[name] Alert bracelet  is recommended        Medication List     STOP taking these medications    insulin glargine 100 UNIT/ML Solostar Pen Commonly known as: LANTUS   metoprolol succinate 50 MG 24 hr tablet Commonly known as: TOPROL-XL  nicotine 21 mg/24hr patch Commonly known as: NICODERM CQ - dosed in mg/24 hours   triamcinolone cream 0.1 % Commonly known as: KENALOG       TAKE these medications    aspirin EC 81 MG tablet Take 81 mg by mouth in the morning. Swallow whole.   blood glucose meter kit and supplies Kit Dispense based on patient and insurance preference. Use up to four times daily as directed.   Centrum Silver 50+Men Tabs Take 1 tablet by mouth daily with breakfast.   folic acid 1 MG tablet Commonly known as:  FOLVITE Take 1 tablet (1 mg total) by mouth daily.   FREESTYLE LITE test strip Generic drug: glucose blood USE TO TEST BLOOD SUGAR TWICE DAILY ICD E11.49   HYDROcodone-acetaminophen 5-325 MG tablet Commonly known as: Norco Take 1 tablet by mouth every 6 (six) hours as needed for moderate pain.   hydrocortisone 10 MG tablet Commonly known as: CORTEF Take 1 tablet (10 mg total) by mouth every evening.   hydrocortisone 5 MG tablet Commonly known as: CORTEF Take 3 tablets (15 mg total) by mouth daily with breakfast. Start taking on: May 17, 2021   ibuprofen 200 MG tablet Commonly known as: ADVIL Take 200-400 mg by mouth every 6 (six) hours as needed for headache or mild pain.   Melatonin 5 MG Chew Chew 5 mg by mouth at bedtime as needed (for sleep).   metFORMIN 1000 MG tablet Commonly known as: GLUCOPHAGE TAKE 1 TABLET BY MOUTH TWICE DAILY WITH A MEAL   metoprolol tartrate 25 MG tablet Commonly known as: LOPRESSOR Take 0.5 tablets (12.5 mg total) by mouth 2 (two) times daily.   midodrine 10 MG tablet Commonly known as: PROAMATINE Take 1 tablet (10 mg total) by mouth 3 (three) times daily with meals.   omeprazole 20 MG capsule Commonly known as: PRILOSEC TAKE 1 CAPSULE(20 MG) BY MOUTH TWICE DAILY BEFORE A MEAL What changed:  how much to take how to take this when to take this additional instructions   prochlorperazine 10 MG tablet Commonly known as: COMPAZINE Take 1 tablet (10 mg total) by mouth every 6 (six) hours as needed for nausea or vomiting.   simvastatin 40 MG tablet Commonly known as: ZOCOR Take 1 tablet (40 mg total) by mouth daily at 6 PM.   traZODone 50 MG tablet Commonly known as: DESYREL Take 1-2 tablets (50-100 mg total) by mouth at bedtime as needed for sleep. What changed: how much to take               Durable Medical Equipment  (From admission, onward)           Start     Ordered   05/15/21 1400  For home use only DME Tub  bench  Once        05/15/21 1359   05/14/21 1415  For home use only DME Walker rolling  Once       Question Answer Comment  Walker: With 5 Inch Wheels   Patient needs a walker to treat with the following condition Weakness      05/14/21 1414           Allergies  Allergen Reactions   Penicillins Anaphylaxis and Swelling    THE THROAT SWELLS!!!! Because of a history of documented adverse serious drug reaction;[name] Alert bracelet  is recommended    Follow-up Information     [name], MD. Go to.   Specialty: Internal Medicine Why:  Please keep your scheduled appointment.  An appointment has been made for you on Monday 05/20/2021 at 10 AM with your PCP Dr. [name]. Contact information: [place] [address] [phone]         Philemon Kingdom, MD Follow up.   Specialty: Internal Medicine Why: Please keep your scheduled appointment.  An appointment has been made for you on Tuesday 05/21/2021 at 8:20 AM with your endocrinologist Dr. Cruzita Lederer. Contact information: [address] [place] [address] [phone]                  The results of significant diagnostics from this hospitalization (including imaging, microbiology, ancillary and laboratory) are listed below for reference.    Significant Diagnostic Studies: CT Head Wo Contrast  Result Date: 05/10/2021 CLINICAL DATA:  Headache EXAM: CT HEAD WITHOUT CONTRAST TECHNIQUE: Contiguous axial images were obtained from the base of the skull through the vertex without intravenous contrast. RADIATION DOSE REDUCTION: This exam was performed according to the departmental dose-optimization program which includes automated exposure control, adjustment of the mA and/or kV according to patient size and/or use of iterative reconstruction technique. COMPARISON:  MRI brain 01/01/2021 FINDINGS: Brain: No acute intracranial hemorrhage, mass effect, or herniation. No extra-axial  fluid collections. No evidence of acute territorial infarct. No hydrocephalus. Moderate cortical volume loss. Mild patchy hypodensities in the periventricular and subcortical white matter, likely secondary to chronic microvascular ischemic changes. Vascular: No hyperdense vessel or unexpected calcification. Skull: Normal. Negative for fracture or focal lesion. Sinuses/Orbits: No acute finding. Other: None. IMPRESSION: Chronic changes with no acute intracranial process identified. Electronically Signed   By: Ofilia Neas M.D.   On: 05/10/2021 12:58   CT Angio Chest PE W and/or Wo Contrast  Result Date: 05/10/2021 CLINICAL DATA:  c/o SOB and dizziness, especially with movement x 2-3 days. Was seen here Monday for dizziness and nausea. Pt has lung CA, last received chemo and radiation Monday. EXAM: CT ANGIOGRAPHY CHEST WITH CONTRAST TECHNIQUE: Multidetector CT imaging of the chest was performed using the standard protocol during bolus administration of intravenous contrast. Multiplanar CT image reconstructions and MIPs were obtained to evaluate the vascular anatomy. RADIATION DOSE REDUCTION: This exam was performed according to the departmental dose-optimization program which includes automated exposure control, adjustment of the mA and/or kV according to patient size and/or use of iterative reconstruction technique. CONTRAST:  10m OMNIPAQUE IOHEXOL 350 MG/ML SOLN COMPARISON:  Chest CT, 03/07/2021. FINDINGS: Cardiovascular: Pulmonary arteries are well opacified. There is no evidence of a pulmonary embolism. Heart is normal in size and configuration. No pericardial effusion. Great vessels are normal in caliber. Mild aortic atherosclerosis. Mediastinum/Nodes: Normal thyroid. No neck base, mediastinal or hilar masses or enlarged lymph nodes. Trachea and esophagus are unremarkable. Lungs/Pleura: Pleural based ill-defined right lower lobe mass measures approximately 5.9 x 3.9 x 5 cm, decreased in size from the  prior exam. Ground-glass type opacities and linear opacities lie along the borders of the mass, and opacity and small cystic spaces extend superior to the mass, mildly decreased from the prior CT. No new lung masses. No suspicious nodules. No other areas of consolidation. Stable emphysema. No pleural effusion or pneumothorax. Upper Abdomen: No acute abnormality. Musculoskeletal: No fracture or acute finding.  No bone lesion. Review of the MIP images confirms the above findings. IMPRESSION: 1. No evidence of a pulmonary embolism. No acute findings in the chest. 2. Right lower lobe mass and adjacent opacities have decreased in size from  the prior CT consistent with a positive response to therapy for non-small cell lung carcinoma. Aortic Atherosclerosis (ICD10-I70.0) and Emphysema (ICD10-J43.9). Electronically Signed   By: Lajean Manes M.D.   On: 05/10/2021 13:25   MR BRAIN W WO CONTRAST  Result Date: 05/15/2021 CLINICAL DATA:  Adrenal insufficiency EXAM: MRI HEAD WITHOUT AND WITH CONTRAST TECHNIQUE: Multiplanar, multiecho pulse sequences of the brain and surrounding structures were obtained without and with intravenous contrast. CONTRAST:  13m GADAVIST GADOBUTROL 1 MMOL/ML IV SOLN COMPARISON:  01/01/2021 FINDINGS: Brain: There is no acute infarction or intracranial hemorrhage. There is no intracranial mass, mass effect, or edema. There is no hydrocephalus or extra-axial fluid collection. Ventricles and sulci are normal in size configuration. No abnormal enhancement. Vascular: Major vessel flow voids at the skull base are preserved. Skull and upper cervical spine: Normal marrow signal is preserved. Sinuses/Orbits: Paranasal sinuses are aerated. Orbits are unremarkable. Other: Sella is unremarkable.  Mastoid air cells are clear. IMPRESSION: No evidence of recent infarction, hemorrhage, mass. No abnormal enhancement. There is no sellar or suprasellar mass identified on this nondedicated examination Electronically  Signed   By: PMacy MisM.D.   On: 05/15/2021 11:16   CT ABDOMEN PELVIS W CONTRAST  Result Date: 05/10/2021 CLINICAL DATA:  Abdominal pain, nonlocalized EXAM: CT ABDOMEN AND PELVIS WITH CONTRAST TECHNIQUE: Multidetector CT imaging of the abdomen and pelvis was performed using the standard protocol following bolus administration of intravenous contrast. RADIATION DOSE REDUCTION: This exam was performed according to the departmental dose-optimization program which includes automated exposure control, adjustment of the mA and/or kV according to patient size and/or use of iterative reconstruction technique. CONTRAST:  850mOMNIPAQUE IOHEXOL 350 MG/ML SOLN COMPARISON:  None. FINDINGS: Lower chest: See separately dictated CT of the chest for discussion of findings above the diaphragm. Hepatobiliary: No focal liver abnormality is seen. No gallstones, gallbladder wall thickening, or biliary dilatation. Pancreas: Unremarkable. No pancreatic ductal dilatation or surrounding inflammatory changes. Spleen: Normal in size without focal abnormality. Adrenals/Urinary Tract: Bilateral adrenal glands are unremarkable. No hydronephrosis or nephrolithiasis. Simple cyst of the upper pole of the left kidney. Normal bladder. Stomach/Bowel: Stomach is within normal limits. Appendix appears normal. No evidence of bowel wall thickening, distention, or inflammatory changes. Vascular/Lymphatic: Aortic atherosclerosis. No enlarged abdominal or pelvic lymph nodes. Reproductive: Prostate calcifications, prostate otherwise unremarkable. Other: Small fat containing right inguinal hernia. No abdominopelvic ascites. Musculoskeletal: No acute or significant osseous findings. IMPRESSION: 1. No acute findings in the abdomen or pelvis. 2. See separately dictated CT of the chest for discussion of findings above the diaphragm. 3.  Aortic Atherosclerosis (ICD10-I70.0). Electronically Signed   By: LeYetta Glassman.D.   On: 05/10/2021 13:01   DG  Chest Port 1 View  Result Date: 05/10/2021 CLINICAL DATA:  Shortness of breath EXAM: PORTABLE CHEST 1 VIEW COMPARISON:  05/06/2021 FINDINGS: Increased right basilar opacity. Right lung mass and radiation changes are present on prior CT in this region. No pleural effusion or pneumothorax. Stable normal heart size. IMPRESSION: Increased right basilar opacity. May reflect progression of radiation changes or other atelectasis/consolidation. Electronically Signed   By: PrMacy Mis.D.   On: 05/10/2021 10:52   DG Chest Portable 1 View  Result Date: 05/06/2021 CLINICAL DATA:  Weak EXAM: PORTABLE CHEST 1 VIEW COMPARISON:  December 2022 FINDINGS: Increased density at the right lung base. Patchy atelectasis left lung base. No pleural effusion or pneumothorax. Similar cardiomediastinal contours. IMPRESSION: Increased density at the right lung base likely reflecting known mass  and radiation changes. Mild atelectasis left lung base. Electronically Signed   By: Macy Mis M.D.   On: 05/06/2021 13:31   ECHOCARDIOGRAM COMPLETE  Result Date: 05/11/2021    ECHOCARDIOGRAM REPORT   Patient Name:   Coston Mandato. Date of Exam: 05/11/2021 Medical Rec #:  [id number]             Height:       72.0 in Accession #:    [id number]            Weight:       201.0 lb Date of Birth:  [birth year]              BSA:          2.135 m Patient Age:    71 years              BP:           105/67 mmHg Patient Gender: M                     HR:           100 bpm. Exam Location:  Inpatient Procedure: 2D Echo Indications:    Syncope  History:        Patient has no prior history of Echocardiogram examinations.                 CAD; Risk Factors:Diabetes.  Sonographer:    Jefferey Pica Referring Phys: [id number] Charlesetta Ivory GONFA  Sonographer Comments: Image acquisition challenging due to respiratory motion. Patient refused contrast. IMPRESSIONS  1. Left ventricular ejection fraction, by estimation, is 60 to 65%. The left ventricle has normal  function. The left ventricle has no regional wall motion abnormalities. There is moderate concentric left ventricular hypertrophy. Left ventricular diastolic parameters were normal.  2. Right ventricular systolic function was not well visualized. The right ventricular size is not well visualized.  3. The mitral valve is grossly normal. No evidence of mitral valve regurgitation.  4. The aortic valve is normal in structure. Aortic valve regurgitation is not visualized. No aortic stenosis is present.  5. There is dilatation of the ascending aorta. FINDINGS  Left Ventricle: Left ventricular ejection fraction, by estimation, is 60 to 65%. The left ventricle has normal function. The left ventricle has no regional wall motion abnormalities. The left ventricular internal cavity size was normal in size. There is  moderate concentric left ventricular hypertrophy. Left ventricular diastolic parameters were normal. Right Ventricle: The right ventricular size is not well visualized. Right vetricular wall thickness was not well visualized. Right ventricular systolic function was not well visualized. Left Atrium: Left atrial size was normal in size. Right Atrium: Right atrial size was normal in size. Pericardium: There is no evidence of pericardial effusion. Mitral Valve: The mitral valve is grossly normal. No evidence of mitral valve regurgitation. Tricuspid Valve: The tricuspid valve is grossly normal. Tricuspid valve regurgitation is not demonstrated. Aortic Valve: The aortic valve is normal in structure. Aortic valve regurgitation is not visualized. No aortic stenosis is present. Aortic valve peak gradient measures 5.2 mmHg. Pulmonic Valve: The pulmonic valve was not well visualized. Pulmonic valve regurgitation is not visualized. Aorta: The aortic root and ascending aorta are structurally normal, with no evidence of dilitation. There is dilatation of the ascending aorta. IAS/Shunts: The atrial septum is grossly normal.  LEFT  VENTRICLE PLAX 2D LVIDd:         4.30 cm   Diastology  LVIDs:         3.80 cm   LV e' medial:    6.42 cm/s LV PW:         1.40 cm   LV E/e' medial:  7.1 LV IVS:        1.60 cm   LV e' lateral:   13.20 cm/s LVOT diam:     2.10 cm   LV E/e' lateral: 3.5 LV SV:         54 LV SV Index:   25 LVOT Area:     3.46 cm  IVC IVC diam: 1.40 cm LEFT ATRIUM             Index        RIGHT ATRIUM           Index LA diam:        3.40 cm 1.59 cm/m   RA Area:     10.20 cm LA Vol (A2C):   37.1 ml 17.38 ml/m  RA Volume:   21.30 ml  9.98 ml/m LA Vol (A4C):   28.8 ml 13.49 ml/m LA Biplane Vol: 35.4 ml 16.58 ml/m  AORTIC VALVE                 PULMONIC VALVE AV Area (Vmax): 3.17 cm     PV Vmax:       0.36 m/s AV Vmax:        113.50 cm/s  PV Peak grad:  0.5 mmHg AV Peak Grad:   5.2 mmHg LVOT Vmax:      104.00 cm/s LVOT Vmean:     65.200 cm/s LVOT VTI:       0.156 m  AORTA Ao Root diam: 4.00 cm Ao Asc diam:  3.90 cm MITRAL VALVE MV Area (PHT): 5.46 cm     SHUNTS MV Decel Time: 139 msec     Systemic VTI:  0.16 m MV E velocity: 45.80 cm/s   Systemic Diam: 2.10 cm MV A velocity: 101.00 cm/s MV E/A ratio:  0.45 Mertie Moores MD Electronically signed by Mertie Moores MD Signature Date/Time: 05/11/2021/1:03:14 PM    Final    VAS US CAROTID  Result Date: 05/12/2021 Carotid Arterial Duplex Study Patient Name:  Phu Record.  Date of Exam:   05/12/2021 Medical Rec #: [id number]              Accession #:    [id number] Date of Birth: [birth year]               Patient Gender: M Patient Age:   30 years Exam Location:  [facility] Procedure:      VAS US CAROTID Referring Phys: Bretta Bang GONFA --------------------------------------------------------------------------------  Indications:      Syncope. Risk Factors:     Hypertension, hyperlipidemia, Diabetes, coronary artery                   disease. Comparison Study: No prior studies. Performing Technologist: Oliver Hum RVT  Examination Guidelines: A complete evaluation includes B-mode  imaging, spectral Doppler, color Doppler, and power Doppler as needed of all accessible portions of each vessel. Bilateral testing is considered an integral part of a complete examination. Limited examinations for reoccurring indications may be performed as noted.  Right Carotid Findings: +----------+--------+--------+--------+-----------------------+--------+             PSV cm/s EDV cm/s Stenosis Plaque Description      Comments  +----------+--------+--------+--------+-----------------------+--------+  CCA Prox   90  21                smooth and heterogenous tortuous  +----------+--------+--------+--------+-----------------------+--------+  CCA Distal 77       20                smooth and heterogenous           +----------+--------+--------+--------+-----------------------+--------+  ICA Prox   65       16                smooth and heterogenous           +----------+--------+--------+--------+-----------------------+--------+  ICA Distal 90       29                                        tortuous  +----------+--------+--------+--------+-----------------------+--------+  ECA        65       10                                                  +----------+--------+--------+--------+-----------------------+--------+ +----------+--------+-------+--------+-------------------+             PSV cm/s EDV cms Describe Arm Pressure (mmHG)  +----------+--------+-------+--------+-------------------+  Subclavian 103                                            +----------+--------+-------+--------+-------------------+ +---------+--------+--+--------+--+---------+  Vertebral PSV cm/s 47 EDV cm/s 12 Antegrade  +---------+--------+--+--------+--+---------+  Left Carotid Findings: +----------+--------+--------+--------+-----------------------+--------+             PSV cm/s EDV cm/s Stenosis Plaque Description      Comments  +----------+--------+--------+--------+-----------------------+--------+  CCA Prox   116      32                 smooth and heterogenous           +----------+--------+--------+--------+-----------------------+--------+  CCA Distal 98       17                smooth and heterogenous           +----------+--------+--------+--------+-----------------------+--------+  ICA Prox   89       37                smooth and heterogenous tortuous  +----------+--------+--------+--------+-----------------------+--------+  ICA Distal 85       34                                        tortuous  +----------+--------+--------+--------+-----------------------+--------+  ECA        97       11                                                  +----------+--------+--------+--------+-----------------------+--------+ +----------+--------+--------+--------+-------------------+             PSV cm/s EDV cm/s Describe Arm Pressure (mmHG)  +----------+--------+--------+--------+-------------------+  Subclavian 140                                             +----------+--------+--------+--------+-------------------+ +---------+--------+--+--------+--+---------+  Vertebral PSV cm/s 70 EDV cm/s 27 Antegrade  +---------+--------+--+--------+--+---------+   Summary: Right Carotid: Velocities in the right ICA are consistent with a 1-39% stenosis. Left Carotid: Velocities in the left ICA are consistent with a 1-39% stenosis. Vertebrals: Bilateral vertebral arteries demonstrate antegrade flow. *See table(s) above for measurements and observations.  Electronically signed by Orlie Pollen on 05/12/2021 at 3:15:12 PM.    Final     Microbiology: Recent Results (from the past 240 hour(s))  Resp Panel by RT-PCR (Flu A&B, Covid) Nasopharyngeal Swab     Status: None   Collection Time: 05/10/21 10:16 AM   Specimen: Nasopharyngeal Swab; Nasopharyngeal(NP) swabs in vial transport medium  Result Value Ref Range Status   SARS Coronavirus 2 by RT PCR NEGATIVE NEGATIVE Final    Comment: (NOTE) SARS-CoV-2 target nucleic acids are NOT DETECTED.  The SARS-CoV-2 RNA  is generally detectable in upper respiratory specimens during the acute phase of infection. The lowest concentration of SARS-CoV-2 viral copies this assay can detect is 138 copies/mL. A negative result does not preclude SARS-Cov-2 infection and should not be used as the sole basis for treatment or other patient management decisions. A negative result may occur with  improper specimen collection/handling, submission of specimen other than nasopharyngeal swab, presence of viral mutation(s) within the areas targeted by this assay, and inadequate number of viral copies(<138 copies/mL). A negative result must be combined with clinical observations, patient history, and epidemiological information. The expected result is Negative.  Fact Sheet for Patients:  EntrepreneurPulse.com.au  Fact Sheet for Healthcare Providers:  IncredibleEmployment.be  This test is no t yet approved or cleared by the [place] FDA and  has been authorized for detection and/or diagnosis of SARS-CoV-2 by FDA under an Emergency Use Authorization (EUA). This EUA will remain  in effect (meaning this test can be used) for the duration of the COVID-19 declaration under Section 564(b)(1) of the Act, 21 U.S.C.section 360bbb-3(b)(1), unless the authorization is terminated  or revoked sooner.       Influenza A by PCR NEGATIVE NEGATIVE Final   Influenza B by PCR NEGATIVE NEGATIVE Final    Comment: (NOTE) The Xpert Xpress SARS-CoV-2/FLU/RSV plus assay is intended as an aid in the diagnosis of influenza from Nasopharyngeal swab specimens and should not be used as a sole basis for treatment. Nasal washings and aspirates are unacceptable for Xpert Xpress SARS-CoV-2/FLU/RSV testing.  Fact Sheet for Patients: EntrepreneurPulse.com.au  Fact Sheet for Healthcare Providers: IncredibleEmployment.be  This test is not yet approved or cleared by the  [place] FDA and has been authorized for detection and/or diagnosis of SARS-CoV-2 by FDA under an Emergency Use Authorization (EUA). This EUA will remain in effect (meaning this test can be used) for the duration of the COVID-19 declaration under Section 564(b)(1) of the Act, 21 U.S.C. section 360bbb-3(b)(1), unless the authorization is terminated or revoked.  Performed at [facility], [place] [address], [place], [place] [address]   Culture, blood (routine x 2)     Status: None   Collection Time: 05/10/21 10:17 AM   Specimen: BLOOD  Result Value Ref Range Status   Specimen Description   Final    BLOOD RIGHT ANTECUBITAL Performed at [place] [address], [place],  [address]    Special Requests   Final    BOTTLES DRAWN AEROBIC AND ANAEROBIC Blood Culture adequate volume Performed at [facility] [address], [place],  [address]    Culture  Final    NO GROWTH 5 DAYS Performed at [facility], [place] [address] [place], [place] [address]    Report Status 05/15/2021 FINAL  Final  Culture, blood (routine x 2)     Status: None   Collection Time: 05/11/21  5:27 AM   Specimen: BLOOD  Result Value Ref Range Status   Specimen Description   Final    BLOOD LEFT ANTECUBITAL Performed at [facility], [place] [address] [place], [place] [address]    Special Requests   Final    BOTTLES DRAWN AEROBIC ONLY Blood Culture adequate volume Performed at [place] [address] [place], [place] [address]    Culture   Final    NO GROWTH 5 DAYS Performed at [facility], [place] [address], [place], [place] [address]    Report Status 05/16/2021 FINAL  Final     Labs: Basic Metabolic Panel: Recent Labs  Lab 05/11/21 0527 05/12/21 0757 05/13/21 0712 05/14/21 0755 05/15/21 0553  NA 130* 133* 136 137 140  K 3.6 3.7 3.5 3.1* 3.7  CL 107 108 107 103 107  CO2 13* 15* 20*  26 26  GLUCOSE 91 84 95 104* 151*  BUN 8 <5* <5* 6* 8  CREATININE 0.73 0.59* 0.64 0.68 0.71  CALCIUM 8.2* 7.7* 8.3* 8.2* 8.6*  MG 1.5* 1.5* 1.6* 1.5* 1.8  PHOS  --  1.8* 2.1* 2.3* 2.9   Liver Function Tests: Recent Labs  Lab 05/10/21 1016 05/12/21 0757 05/13/21 0712 05/14/21 0755 05/15/21 0553  AST 17  --   --   --   --   ALT 14  --   --   --   --   ALKPHOS 64  --   --   --   --   BILITOT 0.8  --   --   --   --   PROT 8.0  --   --   --   --   ALBUMIN 4.1 3.0* 3.1* 3.1* 3.2*   No results for input(s): LIPASE, AMYLASE in the last 168 hours. No results for input(s): AMMONIA in the last 168 hours. CBC: Recent Labs  Lab 05/10/21 1016 05/11/21 0527 05/12/21 0757 05/13/21 0712 05/14/21 0755 05/15/21 0553 05/15/21 1438 05/16/21 0507  WBC 2.5* 1.8*   < > 1.8* 2.1* 1.7* 2.1* 4.5  NEUTROABS 1.5* 1.0*  --   --   --   --  1.2* 2.8  HGB 13.1 10.0*   < > 9.7* 9.7* 10.2* 9.7* 10.1*  HCT 38.3* 29.7*   < > 27.6* 27.2* 29.5* 28.5* 29.6*  MCV 94.8 95.5   < > 92.9 92.8 93.9 95.0 96.1  PLT 99* 90*   < > 116* 138* 137* 137* 139*   < > = values in this interval not displayed.   Cardiac Enzymes: No results for input(s): CKTOTAL, CKMB, CKMBINDEX, TROPONINI in the last 168 hours. BNP: BNP (last 3 results) Recent Labs    05/12/21 0757  BNP 25.2    ProBNP (last 3 results) No results for input(s): PROBNP in the last 8760 hours.  CBG: No results for input(s): GLUCAP in the last 168 hours.     Signed:  Kayleen Memos, MD [facility] 05/16/2021, 4:43 PM

## 2021-05-16 NOTE — Care Management Important Message (Signed)
Important Message ? ?Patient Details IM Letter placed in Patients room. ?Name: Brandon Rubio. ?MRN: [id number] ?Date of Birth: 1950/06/18 ? ? ?Medicare Important Message Given:  Yes ? ? ? ? ?Kerin Salen ?05/16/2021, 9:12 AM ?

## 2021-05-16 NOTE — Progress Notes (Signed)
Dr. Lisbeth Renshaw has reviewed his hospital course and is in agreement that he does not believe our treatment is playing a significant role in his metabolic issues or issues related to cytopenias. I called him to let him know this as well and he still does not wish to resume treatment until tomorrow.  ?

## 2021-05-17 ENCOUNTER — Ambulatory Visit: Payer: Medicare Other

## 2021-05-20 ENCOUNTER — Inpatient Hospital Stay: Payer: Medicare Other

## 2021-05-20 ENCOUNTER — Other Ambulatory Visit: Payer: Self-pay

## 2021-05-20 ENCOUNTER — Inpatient Hospital Stay: Payer: Medicare Other | Admitting: Internal Medicine

## 2021-05-20 ENCOUNTER — Ambulatory Visit: Payer: Medicare Other

## 2021-05-20 ENCOUNTER — Ambulatory Visit
Admission: RE | Admit: 2021-05-20 | Discharge: 2021-05-20 | Disposition: A | Discharge status: Home / Self Care | Payer: Medicare Other | Source: Ambulatory Visit | Attending: Radiation Oncology | Admitting: Radiation Oncology

## 2021-05-20 ENCOUNTER — Ambulatory Visit: Payer: Medicare Other | Admitting: Internal Medicine

## 2021-05-20 DIAGNOSIS — E1142 Type 2 diabetes mellitus with diabetic polyneuropathy: Secondary | ICD-10-CM | POA: Diagnosis present

## 2021-05-20 DIAGNOSIS — R42 Dizziness and giddiness: Secondary | ICD-10-CM | POA: Diagnosis not present

## 2021-05-20 DIAGNOSIS — I252 Old myocardial infarction: Secondary | ICD-10-CM | POA: Diagnosis not present

## 2021-05-20 DIAGNOSIS — E273 Drug-induced adrenocortical insufficiency: Secondary | ICD-10-CM | POA: Diagnosis not present

## 2021-05-20 DIAGNOSIS — E785 Hyperlipidemia, unspecified: Secondary | ICD-10-CM | POA: Diagnosis not present

## 2021-05-20 DIAGNOSIS — E119 Type 2 diabetes mellitus without complications: Secondary | ICD-10-CM | POA: Diagnosis not present

## 2021-05-20 DIAGNOSIS — E2749 Other adrenocortical insufficiency: Secondary | ICD-10-CM | POA: Diagnosis not present

## 2021-05-20 DIAGNOSIS — Z923 Personal history of irradiation: Secondary | ICD-10-CM | POA: Diagnosis not present

## 2021-05-20 DIAGNOSIS — Z7984 Long term (current) use of oral hypoglycemic drugs: Secondary | ICD-10-CM | POA: Diagnosis not present

## 2021-05-20 DIAGNOSIS — R0602 Shortness of breath: Secondary | ICD-10-CM | POA: Diagnosis not present

## 2021-05-20 DIAGNOSIS — E2740 Unspecified adrenocortical insufficiency: Secondary | ICD-10-CM | POA: Diagnosis not present

## 2021-05-20 DIAGNOSIS — R531 Weakness: Secondary | ICD-10-CM | POA: Diagnosis not present

## 2021-05-20 DIAGNOSIS — I2510 Atherosclerotic heart disease of native coronary artery without angina pectoris: Secondary | ICD-10-CM | POA: Diagnosis not present

## 2021-05-20 DIAGNOSIS — Z85828 Personal history of other malignant neoplasm of skin: Secondary | ICD-10-CM | POA: Diagnosis not present

## 2021-05-20 DIAGNOSIS — R21 Rash and other nonspecific skin eruption: Secondary | ICD-10-CM | POA: Diagnosis not present

## 2021-05-20 DIAGNOSIS — D649 Anemia, unspecified: Secondary | ICD-10-CM | POA: Diagnosis not present

## 2021-05-20 DIAGNOSIS — Z801 Family history of malignant neoplasm of trachea, bronchus and lung: Secondary | ICD-10-CM | POA: Diagnosis not present

## 2021-05-20 DIAGNOSIS — Z20822 Contact with and (suspected) exposure to covid-19: Secondary | ICD-10-CM | POA: Diagnosis present

## 2021-05-20 DIAGNOSIS — Z7982 Long term (current) use of aspirin: Secondary | ICD-10-CM | POA: Diagnosis not present

## 2021-05-20 DIAGNOSIS — E876 Hypokalemia: Secondary | ICD-10-CM | POA: Diagnosis not present

## 2021-05-20 DIAGNOSIS — T380X5A Adverse effect of glucocorticoids and synthetic analogues, initial encounter: Secondary | ICD-10-CM | POA: Diagnosis not present

## 2021-05-20 DIAGNOSIS — R627 Adult failure to thrive: Secondary | ICD-10-CM | POA: Diagnosis present

## 2021-05-20 DIAGNOSIS — I10 Essential (primary) hypertension: Secondary | ICD-10-CM | POA: Diagnosis not present

## 2021-05-20 DIAGNOSIS — C3431 Malignant neoplasm of lower lobe, right bronchus or lung: Secondary | ICD-10-CM | POA: Diagnosis not present

## 2021-05-20 DIAGNOSIS — I119 Hypertensive heart disease without heart failure: Secondary | ICD-10-CM | POA: Diagnosis present

## 2021-05-20 DIAGNOSIS — Z85118 Personal history of other malignant neoplasm of bronchus and lung: Secondary | ICD-10-CM | POA: Diagnosis not present

## 2021-05-20 DIAGNOSIS — Z6826 Body mass index (BMI) 26.0-26.9, adult: Secondary | ICD-10-CM | POA: Diagnosis not present

## 2021-05-20 DIAGNOSIS — R55 Syncope and collapse: Secondary | ICD-10-CM | POA: Diagnosis not present

## 2021-05-20 DIAGNOSIS — Z86718 Personal history of other venous thrombosis and embolism: Secondary | ICD-10-CM | POA: Diagnosis not present

## 2021-05-20 DIAGNOSIS — C3491 Malignant neoplasm of unspecified part of right bronchus or lung: Secondary | ICD-10-CM | POA: Diagnosis not present

## 2021-05-20 DIAGNOSIS — K219 Gastro-esophageal reflux disease without esophagitis: Secondary | ICD-10-CM | POA: Diagnosis present

## 2021-05-20 DIAGNOSIS — E1169 Type 2 diabetes mellitus with other specified complication: Secondary | ICD-10-CM | POA: Diagnosis not present

## 2021-05-20 DIAGNOSIS — Z79891 Long term (current) use of opiate analgesic: Secondary | ICD-10-CM | POA: Diagnosis not present

## 2021-05-20 DIAGNOSIS — F17210 Nicotine dependence, cigarettes, uncomplicated: Secondary | ICD-10-CM | POA: Diagnosis present

## 2021-05-20 DIAGNOSIS — Z794 Long term (current) use of insulin: Secondary | ICD-10-CM | POA: Diagnosis not present

## 2021-05-21 ENCOUNTER — Encounter: Payer: Self-pay | Admitting: Internal Medicine

## 2021-05-21 ENCOUNTER — Ambulatory Visit: Payer: Medicare Other

## 2021-05-21 ENCOUNTER — Ambulatory Visit
Admission: RE | Admit: 2021-05-21 | Discharge: 2021-05-21 | Disposition: A | Discharge status: Home / Self Care | Payer: Medicare Other | Source: Ambulatory Visit | Attending: Radiation Oncology | Admitting: Radiation Oncology

## 2021-05-21 ENCOUNTER — Other Ambulatory Visit: Payer: Self-pay

## 2021-05-21 ENCOUNTER — Ambulatory Visit (INDEPENDENT_AMBULATORY_CARE_PROVIDER_SITE_OTHER): Payer: Medicare Other | Admitting: Internal Medicine

## 2021-05-21 VITALS — BP 100/72 | HR 121 | Ht 72.0 in | Wt 197.2 lb

## 2021-05-21 DIAGNOSIS — E2749 Other adrenocortical insufficiency: Secondary | ICD-10-CM

## 2021-05-21 DIAGNOSIS — C3431 Malignant neoplasm of lower lobe, right bronchus or lung: Secondary | ICD-10-CM | POA: Diagnosis not present

## 2021-05-21 DIAGNOSIS — I2510 Atherosclerotic heart disease of native coronary artery without angina pectoris: Secondary | ICD-10-CM | POA: Diagnosis not present

## 2021-05-21 MED ORDER — HYDROCORTISONE SOD SUC (PF) 100 MG IJ SOLR: INTRAMUSCULAR | 99 refills | Status: DC

## 2021-05-21 MED ORDER — HYDROCORTISONE 10 MG PO TABS: 10.0000 mg | ORAL_TABLET | Freq: Two times a day (BID) | ORAL | 3 refills | Status: DC

## 2021-05-21 NOTE — Progress Notes (Signed)
Patient ID: Brandon Perry., male   DOB: 03/10/1951, 71 y.o.   MRN: [id number] ? ?This visit occurred during the SARS-CoV-2 public health emergency.  Safety protocols were in place, including screening questions prior to the visit, additional usage of staff PPE, and extensive cleaning of exam room while observing appropriate contact time as indicated for disinfecting solutions.  ? ?HPI  ?Brandon Rubio. is a 71 y.o.-year-old male, referred by his PCP, Dr. [name], for evaluation for management of adrenal insufficiency. ? ?Patient has a history of lung cancer stage III diagnosed 11/2020.  He was started on ChTx and Immuno Tx.  After 2 ChTx, he got very sick >> admitted. He then tried ChTx (but platelets decreased to low) and RxTx >> very sick >> admitted.  Of note, he got dexamethasone with his treatments.  He is now only on RxTx. ? ?During both admissions dizziness and orthostatic  hypotension and was found to have a low cortisol, which did not stimulate well with cosyntropin: ?Component ?    Latest Ref Rng & Units 05/11/2021 05/12/2021  ?Cortisol, Base ?    ug/dL  0.7  ?Cortisol, 30 Min ?    ug/dL  8.0  ?Cortisol, 60 Min ?    ug/dL  11.6  ?Cortisol, Plasma ?    ug/dL 1.1   ?C206 ACTH ?    7.2 - 63.3 pg/mL  <1.5 (L)  ? ?MRI (05/15/2021); no pituitary mass ? ?An adrenal insufficiency diagnosis was made and he was started on hydrocortisone: ?- 15 mg in am (7-8 am) ?- 10 mg in pm (6-7 pm) ? ?He is also on Midodrine. ? ?No h/o po ketoconazole, phenytoin, rifampin, chronic fluconazole use. ?No h/o autoimmune diseases in pt or family mbs. ?No excess use of NSAIDs. ?No h/o generalized infections or HIV. ?No IVDA. ?No h/o head injury or severe HA. ? ?Pt mentions: ?- + weight loss - lost 20 lbs in last 1.5 mo ?- + fatigue ?- + nausea ?- no vomiting ?- + abdominal pain ?- + mm aches (legs and back) ?- no palpitations ?- + mild HAs ?- + dizziness ?- + presyncopal episodes (when taking a shower) ?- + SOB with minimal  exertion ? ?No recent hyponatremia or hyperkalemia. ?  Chemistry   ?   ?Component Value Date/Time  ? NA 140 05/15/2021 0553  ? K 3.7 05/15/2021 0553  ? CL 107 05/15/2021 0553  ? CO2 26 05/15/2021 0553  ? BUN 8 05/15/2021 0553  ? CREATININE 0.71 05/15/2021 0553  ? CREATININE 0.94 05/06/2021 1055  ?    ?Component Value Date/Time  ? CALCIUM 8.6 (L) 05/15/2021 0553  ? ALKPHOS 64 05/10/2021 1016  ? AST 17 05/10/2021 1016  ? AST 17 05/06/2021 1055  ? ALT 14 05/10/2021 1016  ? ALT 12 05/06/2021 1055  ? BILITOT 0.8 05/10/2021 1016  ? BILITOT 0.7 05/06/2021 1055  ?  ? ?Pt. also has a history of DM2 with recent history of DKA.  However, an HbA1c was excellent at last check: ?Lab Results  ?Component Value Date  ? HGBA1C 5.9 (H) 05/11/2021  ? HGBA1C 9.2 (H) 03/07/2021  ? HGBA1C 8.3 (A) 10/02/2020  ? ?He is on: ?- Metformin 500 mg 2x a day ?- Lantus 15 units daily >> stopped due to good control ?- Jardiance 25 mg daily >> stopped due to good control ? ?He has a history of hyperlipidemia for which she is on Zocor 40 mg daily. ? ?He also has a history  of pancytopenia. ? ?He had COVID-19 in 02/2021. ? ?ROS: ?Constitutional: + See HPI ?Eyes: + blurry vision, no xerophthalmia ?ENT: no sore throat, no nodules palpated in throat, no dysphagia/odynophagia, no hoarseness ?Cardiovascular: no CP/+ SOB/no palpitations/leg swelling ?Respiratory: no cough/+ SOB ?Gastrointestinal: no N/V/D/C ?Musculoskeletal: no muscle/joint aches ?Skin: no rashes ?Neurological: no tremors/numbness/tingling/dizziness ?Psychiatric: no depression/anxiety ? ?Past Medical History:  ?Diagnosis Date  ? CAD (coronary artery disease)   ? Cancer [facility])   ? SKIN.[name]  ? Cataract   ? Diabetes mellitus   ? 2  ? DVT (deep venous thrombosis) ([facility]) 03/30/2001  ? RLE DVT, post ankle fracture  ? GERD (gastroesophageal reflux disease)   ? Hyperlipidemia   ? Hypertension   ? Lung cancer ([facility]) 12/10/2020  ? 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 Peripheral neuropathy   ?  toes  ? ?Past Surgical History:  ?Procedure Laterality Date  ? BRONCHIAL BIOPSY  12/10/2020  ? Procedure: BRONCHIAL BIOPSIES;  Surgeon: Collene Gobble, MD;  [facility] ENDOSCOPY;  Service: Pulmonary;;  ? BRONCHIAL BRUSHINGS  12/10/2020  ? Procedure: BRONCHIAL BRUSHINGS;  Surgeon: Collene Gobble, MD;  Location: [facility] ENDOSCOPY;  Service: Pulmonary;;  ? BRONCHIAL NEEDLE ASPIRATION BIOPSY  12/10/2020  ? Procedure: BRONCHIAL NEEDLE ASPIRATION BIOPSIES;  Surgeon: Collene Gobble, MD;  Location: [facility] ENDOSCOPY;  Service: Pulmonary;;  ? COLONOSCOPY    ? COLONOSCOPY W/ POLYPECTOMY  03/10/2009  ? LARYNGOSCOPY  03/10/1997  ? VIDEO BRONCHOSCOPY WITH ENDOBRONCHIAL NAVIGATION N/A 12/10/2020  ? Procedure: ROBOTIC VIDEO BRONCHOSCOPY WITH ENDOBRONCHIAL NAVIGATION;  Surgeon: Collene Gobble, MD;  Location: [facility] ENDOSCOPY;  Service: Pulmonary;  Laterality: N/A;  ? ?Social History  ? ?Socioeconomic History  ? Marital status: Divorced  ?  Spouse name: Not on file  ? Number of children: 0  ? Years of education: Not on file  ? Highest education level: Not on file  ?Occupational History  ? Occupation: Retired Clinical biochemist  ?Tobacco Use  ? Smoking status: Every Day  ?  Packs/day: 0.25  ?  Years: 58.00  ?  Pack years: 14.50  ?  Types: Cigarettes  ? Smokeless tobacco: Never  ? Tobacco comments:  ?  2+ packs smoked daily [facility] 12/05/20  ?Vaping Use  ? Vaping Use: Never used  ?Substance and Sexual Activity  ? Alcohol use: No  ?  Alcohol/week: 0.0 standard drinks  ?  Comment:  none since 04/2012  ? Drug use: Never  ? Sexual activity: Not on file  ?Other Topics Concern  ? Not on file  ?Social History Narrative  ? REG EXERCISE  ?   ?   ? ?Social Determinants of Health  ? ?Financial Resource Strain: Not on file  ?Food Insecurity: Not on file  ?Transportation Needs: Not on file  ?Physical Activity: Not on file  ?Stress: Not on file  ?Social Connections: Not on file  ?Intimate Partner Violence: Not At Risk  ? Fear of Current or Ex-Partner: No  ? Emotionally  Abused: No  ? Physically Abused: No  ? Sexually Abused: No  ? ?Current Outpatient Medications on File Prior to Visit  ?Medication Sig Dispense Refill  ? aspirin EC 81 MG tablet Take 81 mg by mouth in the morning. Swallow whole.    ? blood glucose meter kit and supplies KIT Dispense based on patient and insurance preference. Use up to four times daily as directed. 1 each 0  ? folic acid (FOLVITE) 1 MG tablet Take 1 tablet (1 mg total) by mouth  daily. 30 tablet 4  ? glucose blood (FREESTYLE LITE) test strip USE TO TEST BLOOD SUGAR TWICE DAILY ICD E11.49 100 each 0  ? HYDROcodone-acetaminophen (NORCO) 5-325 MG tablet Take 1 tablet by mouth every 6 (six) hours as needed for moderate pain. 30 tablet 0  ? hydrocortisone (CORTEF) 10 MG tablet Take 1 tablet (10 mg total) by mouth every evening. 30 tablet 0  ? hydrocortisone (CORTEF) 5 MG tablet Take 3 tablets (15 mg total) by mouth daily with breakfast. 90 tablet 0  ? ibuprofen (ADVIL) 200 MG tablet Take 200-400 mg by mouth every 6 (six) hours as needed for headache or mild pain.    ? Melatonin 5 MG CHEW Chew 5 mg by mouth at bedtime as needed (for sleep).    ? metFORMIN (GLUCOPHAGE) 1000 MG tablet TAKE 1 TABLET BY MOUTH TWICE DAILY WITH A MEAL (Patient taking differently: Take 1,000 mg by mouth 2 (two) times daily with a meal.) 180 tablet 1  ? metoprolol tartrate (LOPRESSOR) 25 MG tablet Take 0.5 tablets (12.5 mg total) by mouth 2 (two) times daily. 60 tablet 0  ? midodrine (PROAMATINE) 10 MG tablet Take 1 tablet (10 mg total) by mouth 3 (three) times daily with meals. 90 tablet 0  ? Multiple Vitamins-Minerals (CENTRUM SILVER 50+MEN) TABS Take 1 tablet by mouth daily with breakfast.    ? omeprazole (PRILOSEC) 20 MG capsule TAKE 1 CAPSULE(20 MG) BY MOUTH TWICE DAILY BEFORE A MEAL (Patient taking differently: Take 20 mg by mouth 2 (two) times daily before a meal.) 180 capsule 3  ? prochlorperazine (COMPAZINE) 10 MG tablet Take 1 tablet (10 mg total) by mouth every 6 (six)  hours as needed for nausea or vomiting. 30 tablet 0  ? simvastatin (ZOCOR) 40 MG tablet Take 1 tablet (40 mg total) by mouth daily at 6 PM. 90 tablet 3  ? traZODone (DESYREL) 50 MG tablet Take 1-2 tablets (50

## 2021-05-21 NOTE — Patient Instructions (Addendum)
Please continue hydrocortisone but try to reduce the doses as follows: ?- 10 mg in am and 10 mg in pm for the next month, then ?- 10 mg in am and 5 mg in pm ? ?Take the first dose when you wake up in the second dose around 2 to 3 PM, but no later than 6 PM. ? ?You absolutely need to take this medication every day and not skip doses. ? ?Please double the dose if you have a fever, for the duration of the fever.  Also, if you have significant other stress like dehydration, diarrhea, or otherwise unexplained weakness. ? ?If you cannot take anything by mouth (vomiting) or you have severe diarrhea so that you eliminate the hydrocortisone pills in your stool, please make sure that you get the hydrocortisone in the vein instead - go to the nearest emergency department/urgent care or you may go to your PCPs office. ? ?Please try to get a MedAlert bracelet or pendant indicating: "Adrenal insufficiency".  ? ?Please come back for a follow-up appointment in 3 months. ?

## 2021-05-22 ENCOUNTER — Ambulatory Visit: Payer: Medicare Other

## 2021-05-22 ENCOUNTER — Ambulatory Visit
Admission: RE | Admit: 2021-05-22 | Discharge: 2021-05-22 | Disposition: A | Discharge status: Home / Self Care | Payer: Medicare Other | Source: Ambulatory Visit | Attending: Radiation Oncology | Admitting: Radiation Oncology

## 2021-05-22 DIAGNOSIS — Z79899 Other long term (current) drug therapy: Secondary | ICD-10-CM | POA: Diagnosis not present

## 2021-05-22 DIAGNOSIS — Z510 Encounter for antineoplastic radiation therapy: Secondary | ICD-10-CM | POA: Diagnosis present

## 2021-05-22 DIAGNOSIS — Z5111 Encounter for antineoplastic chemotherapy: Secondary | ICD-10-CM | POA: Diagnosis present

## 2021-05-22 DIAGNOSIS — C3431 Malignant neoplasm of lower lobe, right bronchus or lung: Secondary | ICD-10-CM | POA: Diagnosis not present

## 2021-05-23 ENCOUNTER — Telehealth: Payer: Self-pay | Admitting: Radiation Oncology

## 2021-05-23 ENCOUNTER — Other Ambulatory Visit: Payer: Self-pay

## 2021-05-23 ENCOUNTER — Ambulatory Visit: Payer: Medicare Other

## 2021-05-23 ENCOUNTER — Telehealth: Payer: Self-pay | Admitting: Internal Medicine

## 2021-05-23 ENCOUNTER — Inpatient Hospital Stay (HOSPITAL_COMMUNITY)
Admission: EM | Admit: 2021-05-23 | Discharge: 2021-05-27 | DRG: 644 | Disposition: A | Discharge status: Home / Self Care | Payer: Medicare Other | Attending: Internal Medicine | Admitting: Internal Medicine

## 2021-05-23 ENCOUNTER — Encounter (HOSPITAL_COMMUNITY): Payer: Self-pay

## 2021-05-23 DIAGNOSIS — E273 Drug-induced adrenocortical insufficiency: Secondary | ICD-10-CM | POA: Diagnosis present

## 2021-05-23 DIAGNOSIS — K219 Gastro-esophageal reflux disease without esophagitis: Secondary | ICD-10-CM | POA: Diagnosis present

## 2021-05-23 DIAGNOSIS — R55 Syncope and collapse: Secondary | ICD-10-CM | POA: Diagnosis not present

## 2021-05-23 DIAGNOSIS — F17210 Nicotine dependence, cigarettes, uncomplicated: Secondary | ICD-10-CM | POA: Diagnosis present

## 2021-05-23 DIAGNOSIS — Z794 Long term (current) use of insulin: Secondary | ICD-10-CM | POA: Diagnosis not present

## 2021-05-23 DIAGNOSIS — R21 Rash and other nonspecific skin eruption: Secondary | ICD-10-CM | POA: Diagnosis not present

## 2021-05-23 DIAGNOSIS — Z923 Personal history of irradiation: Secondary | ICD-10-CM

## 2021-05-23 DIAGNOSIS — Z7982 Long term (current) use of aspirin: Secondary | ICD-10-CM

## 2021-05-23 DIAGNOSIS — E785 Hyperlipidemia, unspecified: Secondary | ICD-10-CM | POA: Diagnosis present

## 2021-05-23 DIAGNOSIS — Z79891 Long term (current) use of opiate analgesic: Secondary | ICD-10-CM | POA: Diagnosis not present

## 2021-05-23 DIAGNOSIS — E118 Type 2 diabetes mellitus with unspecified complications: Secondary | ICD-10-CM

## 2021-05-23 DIAGNOSIS — Z86718 Personal history of other venous thrombosis and embolism: Secondary | ICD-10-CM | POA: Diagnosis not present

## 2021-05-23 DIAGNOSIS — Z7984 Long term (current) use of oral hypoglycemic drugs: Secondary | ICD-10-CM

## 2021-05-23 DIAGNOSIS — R42 Dizziness and giddiness: Secondary | ICD-10-CM

## 2021-05-23 DIAGNOSIS — Z85118 Personal history of other malignant neoplasm of bronchus and lung: Secondary | ICD-10-CM | POA: Diagnosis not present

## 2021-05-23 DIAGNOSIS — I119 Hypertensive heart disease without heart failure: Secondary | ICD-10-CM | POA: Diagnosis present

## 2021-05-23 DIAGNOSIS — T380X5A Adverse effect of glucocorticoids and synthetic analogues, initial encounter: Secondary | ICD-10-CM

## 2021-05-23 DIAGNOSIS — I2510 Atherosclerotic heart disease of native coronary artery without angina pectoris: Secondary | ICD-10-CM | POA: Diagnosis present

## 2021-05-23 DIAGNOSIS — R627 Adult failure to thrive: Secondary | ICD-10-CM | POA: Diagnosis present

## 2021-05-23 DIAGNOSIS — Z79899 Other long term (current) drug therapy: Secondary | ICD-10-CM

## 2021-05-23 DIAGNOSIS — I252 Old myocardial infarction: Secondary | ICD-10-CM | POA: Diagnosis not present

## 2021-05-23 DIAGNOSIS — Z801 Family history of malignant neoplasm of trachea, bronchus and lung: Secondary | ICD-10-CM

## 2021-05-23 DIAGNOSIS — I10 Essential (primary) hypertension: Secondary | ICD-10-CM | POA: Diagnosis present

## 2021-05-23 DIAGNOSIS — E1142 Type 2 diabetes mellitus with diabetic polyneuropathy: Secondary | ICD-10-CM | POA: Diagnosis present

## 2021-05-23 DIAGNOSIS — R531 Weakness: Principal | ICD-10-CM

## 2021-05-23 DIAGNOSIS — E876 Hypokalemia: Secondary | ICD-10-CM | POA: Diagnosis not present

## 2021-05-23 DIAGNOSIS — R0602 Shortness of breath: Secondary | ICD-10-CM | POA: Diagnosis not present

## 2021-05-23 DIAGNOSIS — E2740 Unspecified adrenocortical insufficiency: Secondary | ICD-10-CM | POA: Diagnosis present

## 2021-05-23 DIAGNOSIS — Z20822 Contact with and (suspected) exposure to covid-19: Secondary | ICD-10-CM | POA: Diagnosis present

## 2021-05-23 DIAGNOSIS — E1169 Type 2 diabetes mellitus with other specified complication: Secondary | ICD-10-CM | POA: Diagnosis present

## 2021-05-23 DIAGNOSIS — C3491 Malignant neoplasm of unspecified part of right bronchus or lung: Secondary | ICD-10-CM | POA: Diagnosis present

## 2021-05-23 DIAGNOSIS — Z85828 Personal history of other malignant neoplasm of skin: Secondary | ICD-10-CM | POA: Diagnosis not present

## 2021-05-23 DIAGNOSIS — D649 Anemia, unspecified: Secondary | ICD-10-CM | POA: Diagnosis present

## 2021-05-23 DIAGNOSIS — E119 Type 2 diabetes mellitus without complications: Secondary | ICD-10-CM

## 2021-05-23 DIAGNOSIS — C3431 Malignant neoplasm of lower lobe, right bronchus or lung: Secondary | ICD-10-CM | POA: Diagnosis not present

## 2021-05-23 DIAGNOSIS — Z6826 Body mass index (BMI) 26.0-26.9, adult: Secondary | ICD-10-CM | POA: Diagnosis not present

## 2021-05-23 DIAGNOSIS — T490X5A Adverse effect of local antifungal, anti-infective and anti-inflammatory drugs, initial encounter: Secondary | ICD-10-CM | POA: Diagnosis present

## 2021-05-23 DIAGNOSIS — Z880 Allergy status to penicillin: Secondary | ICD-10-CM

## 2021-05-23 LAB — COMPREHENSIVE METABOLIC PANEL
ALT: 11 U/L (ref 0–44)
AST: 17 U/L (ref 15–41)
Albumin: 4 g/dL (ref 3.5–5.0)
Alkaline Phosphatase: 50 U/L (ref 38–126)
Anion gap: 12 (ref 5–15)
BUN: 12 mg/dL (ref 8–23)
CO2: 25 mmol/L (ref 22–32)
Calcium: 8.8 mg/dL — ABNORMAL LOW (ref 8.9–10.3)
Chloride: 99 mmol/L (ref 98–111)
Creatinine, Ser: 0.98 mg/dL (ref 0.61–1.24)
GFR, Estimated: >60 mL/min (ref >60)
Glucose, Bld: 143 mg/dL — ABNORMAL HIGH (ref 70–99)
Potassium: 3.5 mmol/L (ref 3.5–5.1)
Sodium: 136 mmol/L (ref 135–145)
Total Bilirubin: 0.4 mg/dL (ref 0.3–1.2)
Total Protein: 7.4 g/dL (ref 6.5–8.1)

## 2021-05-23 LAB — RETICULOCYTES
Immature Retic Fract: 23.3 % — ABNORMAL HIGH (ref 2.3–15.9)
RBC.: 3.23 MIL/uL — ABNORMAL LOW (ref 4.22–5.81)
Retic Count, Absolute: 91.4 10*3/uL (ref 19.0–186.0)
Retic Ct Pct: 2.8 % (ref 0.4–3.1)

## 2021-05-23 LAB — CBC WITH DIFFERENTIAL/PLATELET
Abs Immature Granulocytes: 0.08 10*3/uL — ABNORMAL HIGH (ref 0.00–0.07)
Basophils Absolute: 0.1 10*3/uL (ref 0.0–0.1)
Basophils Relative: 1 %
Eosinophils Absolute: 0.1 10*3/uL (ref 0.0–0.5)
Eosinophils Relative: 1 %
HCT: 31 % — ABNORMAL LOW (ref 39.0–52.0)
Hemoglobin: 10.7 g/dL — ABNORMAL LOW (ref 13.0–17.0)
Immature Granulocytes: 1 %
Lymphocytes Relative: 12 %
Lymphs Abs: 0.7 10*3/uL (ref 0.7–4.0)
MCH: 32.9 pg (ref 26.0–34.0)
MCHC: 34.5 g/dL (ref 30.0–36.0)
MCV: 95.4 fL (ref 80.0–100.0)
Monocytes Absolute: 0.6 10*3/uL (ref 0.1–1.0)
Monocytes Relative: 10 %
Neutro Abs: 4.5 10*3/uL (ref 1.7–7.7)
Neutrophils Relative %: 75 %
Platelets: 190 10*3/uL (ref 150–400)
RBC: 3.25 MIL/uL — ABNORMAL LOW (ref 4.22–5.81)
RDW: 15.9 % — ABNORMAL HIGH (ref 11.5–15.5)
WBC: 6 10*3/uL (ref 4.0–10.5)
nRBC: 0 % (ref 0.0–0.2)

## 2021-05-23 LAB — IRON AND TIBC
Iron: 54 ug/dL (ref 45–182)
Saturation Ratios: 18 % (ref 17.9–39.5)
TIBC: 303 ug/dL (ref 250–450)
UIBC: 249 ug/dL

## 2021-05-23 LAB — RESP PANEL BY RT-PCR (FLU A&B, COVID) ARPGX2
Influenza A by PCR: NEGATIVE
Influenza B by PCR: NEGATIVE
SARS Coronavirus 2 by RT PCR: NEGATIVE

## 2021-05-23 LAB — MAGNESIUM: Magnesium: 1.1 mg/dL — ABNORMAL LOW (ref 1.7–2.4)

## 2021-05-23 LAB — GLUCOSE, CAPILLARY
Glucose-Capillary: 152 mg/dL — ABNORMAL HIGH (ref 70–99)
Glucose-Capillary: 177 mg/dL — ABNORMAL HIGH (ref 70–99)

## 2021-05-23 LAB — FERRITIN: Ferritin: 375 ng/mL — ABNORMAL HIGH (ref 24–336)

## 2021-05-23 LAB — VITAMIN B12: Vitamin B-12: 601 pg/mL (ref 180–914)

## 2021-05-23 LAB — FOLATE: Folate: 71.5 ng/mL (ref >5.9)

## 2021-05-23 MED ORDER — ASPIRIN EC 81 MG PO TBEC
81.0000 mg | DELAYED_RELEASE_TABLET | Freq: Every morning | ORAL | Status: DC
  Administered 2021-05-24 – 2021-05-27 (×4): 81 mg via ORAL
  Filled 2021-05-23 (×3): qty 1

## 2021-05-23 MED ORDER — SODIUM CHLORIDE 0.9 % IV BOLUS
1000.0000 mL | Freq: Once | INTRAVENOUS | Status: AC
Start: 2021-05-23 — End: 2021-05-23
  Administered 2021-05-23: 1000 mL via INTRAVENOUS

## 2021-05-23 MED ORDER — FOLIC ACID 1 MG PO TABS
1.0000 mg | ORAL_TABLET | Freq: Every day | ORAL | Status: DC
  Administered 2021-05-24 – 2021-05-27 (×4): 1 mg via ORAL
  Filled 2021-05-23 (×4): qty 1

## 2021-05-23 MED ORDER — HYDROCORTISONE SOD SUC (PF) 100 MG IJ SOLR: 50.0000 mg | Freq: Two times a day (BID) | INTRAMUSCULAR | Status: DC

## 2021-05-23 MED ORDER — MAGNESIUM OXIDE -MG SUPPLEMENT 400 (240 MG) MG PO TABS
800.0000 mg | ORAL_TABLET | Freq: Once | ORAL | Status: AC
Start: 2021-05-23 — End: 2021-05-23
  Administered 2021-05-23: 800 mg via ORAL
  Filled 2021-05-23: qty 2

## 2021-05-23 MED ORDER — ENOXAPARIN SODIUM 40 MG/0.4ML IJ SOSY
40.0000 mg | PREFILLED_SYRINGE | Freq: Every day | INTRAMUSCULAR | Status: DC
  Administered 2021-05-23 – 2021-05-27 (×5): 40 mg via SUBCUTANEOUS
  Filled 2021-05-23 (×5): qty 0.4

## 2021-05-23 MED ORDER — INSULIN ASPART 100 UNIT/ML IJ SOLN
0.0000 [IU] | INTRAMUSCULAR | Status: DC
  Administered 2021-05-23: 3 [IU] via SUBCUTANEOUS
  Administered 2021-05-24 (×3): 2 [IU] via SUBCUTANEOUS
  Administered 2021-05-24: 3 [IU] via SUBCUTANEOUS
  Administered 2021-05-25 (×3): 2 [IU] via SUBCUTANEOUS
  Administered 2021-05-25: 3 [IU] via SUBCUTANEOUS
  Administered 2021-05-26 (×2): 2 [IU] via SUBCUTANEOUS
  Administered 2021-05-26: 3 [IU] via SUBCUTANEOUS
  Administered 2021-05-27: 2 [IU] via SUBCUTANEOUS
  Filled 2021-05-23: qty 0.15

## 2021-05-23 MED ORDER — HYDROCORTISONE SOD SUC (PF) 100 MG IJ SOLR
50.0000 mg | Freq: Once | INTRAMUSCULAR | Status: AC
  Administered 2021-05-23: 50 mg via INTRAVENOUS
  Filled 2021-05-23: qty 2

## 2021-05-23 MED ORDER — HYDROCORTISONE SOD SUC (PF) 100 MG IJ SOLR
50.0000 mg | Freq: Two times a day (BID) | INTRAMUSCULAR | Status: DC
  Administered 2021-05-24 – 2021-05-27 (×6): 50 mg via INTRAVENOUS
  Filled 2021-05-23 (×6): qty 2

## 2021-05-23 MED ORDER — HYDROCODONE-ACETAMINOPHEN 5-325 MG PO TABS
1.0000 | ORAL_TABLET | Freq: Four times a day (QID) | ORAL | Status: DC | PRN
  Administered 2021-05-23 – 2021-05-26 (×7): 1 via ORAL
  Filled 2021-05-23 (×7): qty 1

## 2021-05-23 MED ORDER — PROCHLORPERAZINE MALEATE 10 MG PO TABS: 10.0000 mg | ORAL_TABLET | Freq: Four times a day (QID) | ORAL | Status: DC | PRN

## 2021-05-23 MED ORDER — SIMVASTATIN 20 MG PO TABS
40.0000 mg | ORAL_TABLET | Freq: Every day | ORAL | Status: DC
  Administered 2021-05-24 – 2021-05-26 (×3): 40 mg via ORAL
  Filled 2021-05-23 (×3): qty 2

## 2021-05-23 MED ORDER — ADULT MULTIVITAMIN W/MINERALS CH
1.0000 | ORAL_TABLET | Freq: Every day | ORAL | Status: DC
  Administered 2021-05-24 – 2021-05-27 (×4): 1 via ORAL
  Filled 2021-05-23 (×4): qty 1

## 2021-05-23 NOTE — Progress Notes (Signed)
Pt arrived to 1436 in stable ambulatory condition. Coolidge Breeze, RN 05/23/2021 ? ?

## 2021-05-23 NOTE — Telephone Encounter (Signed)
Called patient regarding 03/21 appointment, patient is notified.  ?

## 2021-05-23 NOTE — H&P (Signed)
?[facility] ?History and Physical ? ?Brandon Rubio. GMW:[id number] DOB: Jun 30, 1950 DOA: 05/23/2021 ? ?Referring physician:  ?PCP: [name], MD  ? ?Chief Complaint: Generalized weakness ? ?HPI: Brandon Rubio. is a 71 y.o.  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  ?Presents emergency department for evaluation of weakness and fatigue.  Patient has had 2 prolonged hospitalizations for similar presentation and ultimately followed up with patient's outpatient endocrinologist Dr. Cruzita Lederer who recommended a slow transition down from his 15 mg a.m. and 10 mg p.m. to 10 mg and 10 mg.  He states that over the last 3 days he has noticed a noticeable change in his ability to move and states that he is so weak that he could not make his radiation appointment today.  He denies chest pain, shortness breath, abdominal pain, nausea, vomiting or other systemic symptoms. ? ? ? ?Review of Systems:  ?Covid vaccination; vaccinated ? ?Constitutional:  ?No weight loss, night sweats, Fevers, chills, fatigue.  positive change in taste ?HEENT:  ?No headaches, Difficulty swallowing,Tooth/dental problems,Sore throat,  ?No sneezing, itching, ear ache, nasal congestion, post nasal drip,  ?Cardio-vascular:  ?No chest pain, Orthopnea, PND, swelling in lower extremities, anasarca, positive dizziness, palpitations  ?GI:  ?No heartburn, indigestion, abdominal pain, nausea, vomiting, diarrhea, change in bowel habits, loss of appetite  ?Resp:  ?No shortness of breath with exertion or at rest. No excess mucus, no productive cough, No non-productive cough, No coughing up of blood.No change in color of mucus.No wheezing.No chest wall deformity  ?Skin:  ?no rash or lesions.  ?GU:  ?no dysuria, change in color of urine, no urgency or frequency. No flank pain.  ?Musculoskeletal:  ?No joint pain or  swelling. No decreased range of motion. No back pain.  ?Psych:  ?No change in mood or affect. No depression or anxiety. No memory loss.  ? ?Past Medical History:  ?Diagnosis Date  ? CAD (coronary artery disease)   ? Cancer [facility])   ? SKIN.[name]  ? Cataract   ? Diabetes mellitus   ? 2  ? DVT (deep venous thrombosis) ([place]) 03/30/2001  ? RLE DVT, post ankle fracture  ? GERD (gastroesophageal reflux disease)   ? Hyperlipidemia   ? Hypertension   ? Lung cancer ([place]) 12/10/2020  ? 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 Peripheral neuropathy   ? toes  ? ?Past Surgical History:  ?Procedure Laterality Date  ? BRONCHIAL BIOPSY  12/10/2020  ? Procedure: BRONCHIAL BIOPSIES;  Surgeon: Collene Gobble, MD;  Location: [facility] ENDOSCOPY;  Service: Pulmonary;;  ? BRONCHIAL BRUSHINGS  12/10/2020  ? Procedure: BRONCHIAL BRUSHINGS;  Surgeon: Collene Gobble, MD;  Location: [facility] ENDOSCOPY;  Service: Pulmonary;;  ? BRONCHIAL NEEDLE ASPIRATION BIOPSY  12/10/2020  ? Procedure: BRONCHIAL NEEDLE ASPIRATION BIOPSIES;  Surgeon: Collene Gobble, MD;  Location: [facility] ENDOSCOPY;  Service: Pulmonary;;  ? COLONOSCOPY    ? COLONOSCOPY W/ POLYPECTOMY  03/10/2009  ? LARYNGOSCOPY  03/10/1997  ? VIDEO BRONCHOSCOPY WITH ENDOBRONCHIAL NAVIGATION N/A 12/10/2020  ? Procedure: ROBOTIC VIDEO BRONCHOSCOPY WITH ENDOBRONCHIAL NAVIGATION;  Surgeon: Collene Gobble, MD;  Location: [facility] ENDOSCOPY;  Service: Pulmonary;  Laterality: N/A;  ? ?Social History:  reports that he has been smoking cigarettes. He has a 14.50 pack-year smoking history. He has never used smokeless tobacco. He reports that he does not drink alcohol and does not use drugs. ? ?  Allergies  ?Allergen Reactions  ? Penicillins Anaphylaxis and Swelling  ?  THE THROAT SWELLS!!!! ?Because of a history of documented adverse serious drug reaction;[name] Alert bracelet  is recommended  ? ? ?Family History  ?Problem Relation Age of Onset  ? Lung cancer Mother   ?     X 2  ? Colon polyps Father   ? Colon cancer Father  41  ? Coronary artery disease Father   ? Lung disease Father   ? Esophageal cancer Neg Hx   ? Stomach cancer Neg Hx   ? Rectal cancer Neg Hx   ? ? ?Prior to Admission medications   ?Medication Sig Start Date End Date Taking? Authorizing Provider  ?aspirin EC 81 MG tablet Take 81 mg by mouth in the morning. Swallow whole.   Yes [provider]  ?folic acid (FOLVITE) 1 MG tablet Take 1 tablet (1 mg total) by mouth daily. 01/16/21  Yes Curt Bears, MD  ?HYDROcodone-acetaminophen (NORCO) 5-325 MG tablet Take 1 tablet by mouth every 6 (six) hours as needed for moderate pain. 03/18/21  Yes Heilingoetter, [name], PA-C  ?hydrocortisone (CORTEF) 10 MG tablet Take 1 tablet (10 mg total) by mouth 2 (two) times daily. 05/21/21 06/20/21 Yes Philemon Kingdom, MD  ?metFORMIN (GLUCOPHAGE) 1000 MG tablet TAKE 1 TABLET BY MOUTH TWICE DAILY WITH A MEAL ?Patient taking differently: Take 1,000 mg by mouth 2 (two) times daily with a meal. 09/14/20  Yes [name], MD  ?midodrine (PROAMATINE) 10 MG tablet Take 1 tablet (10 mg total) by mouth 3 (three) times daily with meals. 05/16/21 06/15/21 Yes Kayleen Memos, DO  ?Multiple Vitamins-Minerals (CENTRUM SILVER 50+MEN) TABS Take 1 tablet by mouth daily with breakfast.   Yes [provider]  ?omeprazole (PRILOSEC) 20 MG capsule TAKE 1 CAPSULE(20 MG) BY MOUTH TWICE DAILY BEFORE A MEAL ?Patient taking differently: Take 20 mg by mouth 2 (two) times daily before a meal. 10/02/20  Yes [name], MD  ?prochlorperazine (COMPAZINE) 10 MG tablet Take 1 tablet (10 mg total) by mouth every 6 (six) hours as needed for nausea or vomiting. 02/21/21  Yes Curt Bears, MD  ?simvastatin (ZOCOR) 40 MG tablet Take 1 tablet (40 mg total) by mouth daily at 6 PM. 06/26/20  Yes [name], MD  ?hydrocortisone sodium succinate (SOLU-CORTEF) 100 MG injection Inject 100 mg in the muscle as needed for adrenal crisis. ?Patient not taking: Reported on 05/23/2021  05/21/21   Philemon Kingdom, MD  ?ibuprofen (ADVIL) 200 MG tablet Take 200-400 mg by mouth every 6 (six) hours as needed for headache or mild pain. ?Patient not taking: Reported on 05/23/2021    [provider]  ?Melatonin 5 MG CHEW Chew 5 mg by mouth at bedtime as needed (for sleep). ?Patient not taking: Reported on 05/23/2021    [provider]  ?metoprolol tartrate (LOPRESSOR) 25 MG tablet Take 0.5 tablets (12.5 mg total) by mouth 2 (two) times daily. ?Patient not taking: Reported on 05/23/2021 05/16/21 07/15/21  Kayleen Memos, DO  ?traZODone (DESYREL) 50 MG tablet Take 1-2 tablets (50-100 mg total) by mouth at bedtime as needed for sleep. ?Patient not taking: Reported on 05/23/2021 12/28/20   [name], MD  ? ? ? ?Consultants:  ? ? ?Procedures/Significant Events:  ? ? ?I have personally reviewed and interpreted all radiology studies and my findings are as above. ? ? ?VENTILATOR SETTINGS: ? ? ? ?Cultures ?3/16 influenza A/B negative ?3/16 SARS coronavirus negative ? ?  Antimicrobials: ? ? ? ?Devices ?  ? ?LINES / TUBES:  ? ? ? ? ?Continuous Infusions: ? ?Physical Exam: ?Vitals:  ? 05/23/21 1351 05/23/21 1500 05/23/21 1600  ?BP: 108/74 105/73 109/70  ?Pulse: (!) 117 97 94  ?Resp: 16 16 14   ?Temp: 98.1 ?F (36.7 ?C)    ?TempSrc: Oral    ?SpO2: 99% 93% 97%  ? ? ?Wt Readings from Last 3 Encounters:  ?05/21/21 89.4 kg  ?05/10/21 91.2 kg  ?05/07/21 91.9 kg  ? ? ?General: No acute respiratory distress positive fatigue ?Eyes: negative scleral hemorrhage, negative anisocoria, negative icterus ?ENT: Negative Runny nose, negative gingival bleeding, ?Neck:  Negative scars, masses, torticollis, lymphadenopathy, JVD ?Lungs: Clear to auscultation bilaterally without wheezes or crackles ?Cardiovascular: Regular rate and rhythm without murmur gallop or rub normal S1 and S2 ?Abdomen: negative abdominal pain, nondistended, positive soft, bowel sounds, no rebound, no ascites, no appreciable mass ?Extremities: No  significant cyanosis, clubbing, or edema bilateral lower extremities ?Skin: Negative rashes, lesions, ulcers ?Psychiatric:  Negative depression, negative anxiety, negative fatigue, negative mania  ?Central ner

## 2021-05-23 NOTE — ED Provider Triage Note (Signed)
Emergency Medicine Provider Triage Evaluation Note ? ?Brandon Rubio. , a 71 y.o. male  was evaluated in triage.  Pt complains of fatigue and back pain.  Reports recently being diagnosed with lung cancer.  He is following up chemotherapy and radiation however he is feeling increasingly weak and feels as though he cannot walk.  He is supposed to have chemo every week however has not had any in 3 weeks.  Supposed to have radiation today but did not feel as though he can make it.  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 ? ? ?Review of Systems  ?Positive: Weakness, back pain, dysuria ?Negative: Hematuria, abdominal pain, nausea, vomiting ? ?Physical Exam  ?BP 108/74 (BP Location: Left Arm)   Pulse (!) 117   Temp 98.1 ?F (36.7 ?C) (Oral)   Resp 16   SpO2 99%  ?Gen:   Awake, no distress   ?Resp:  Normal effort  ?MSK:   Moves extremities without difficulty  ?Other:  No CVA.  Alert and oriented x3.  Resting comfortably on stretcher.  Heart rate between 98 and 104. ? ?Medical Decision Making  ?Medically screening exam initiated at 2:34 PM.  Appropriate orders placed.  Brandon Rubio. was informed that the remainder of the evaluation will be completed by another provider, this initial triage assessment does not replace that evaluation, and the importance of remaining in the ED until their evaluation is complete. ? ? ?  ?Rhae Hammock, PA-C ?05/23/21 1444 ? ?

## 2021-05-23 NOTE — Telephone Encounter (Signed)
Patient stated he is going to cancel his treatment appointment scheduled 3/16. Patient is not feeling well, going to go to the ED department for evaluation.  ?

## 2021-05-23 NOTE — ED Triage Notes (Signed)
Pt presents with c/o weakness. Pt is a cancer patient, last chemo 3-4 weeks ago. Pt reports he was recently here and admitted to the hospital. Pt says he is having trouble ambulating and feels like he is dehydrated.  ?

## 2021-05-23 NOTE — ED Provider Notes (Signed)
?[facility] DEPT ?Provider Note ? ?CSN: [id number] ?Arrival date & time: 05/23/21 1326 ? ?Chief Complaint(s) ?Weakness ? ?HPI ?Brandon Rubio. is a 71 y.o. 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  Patient has had 2 prolonged hospitalizations for similar presentation and ultimately followed up with patient's outpatient endocrinologist Dr. Cruzita Lederer who recommended a slow transition down from his 15 mg a.m. and 10 mg p.m. to 10 mg and 10 mg.  He states that over the last 3 days he has noticed a noticeable change in his ability to move and states that he is so weak that he could not make his radiation appointment today.  He denies chest pain, shortness breath, abdominal pain, nausea, vomiting or other systemic symptoms. ? ? ?Weakness ? ?Past Medical History ?Past Medical History:  ?Diagnosis Date  ? CAD (coronary artery disease)   ? Cancer [facility])   ? SKIN.[name]  ? Cataract   ? Diabetes mellitus   ? 2  ? DVT (deep venous thrombosis) ([place]) 03/30/2001  ? RLE DVT, post ankle fracture  ? GERD (gastroesophageal reflux disease)   ? Hyperlipidemia   ? Hypertension   ? Lung cancer ([place]) 12/10/2020  ? 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 Peripheral neuropathy   ? toes  ? ?Patient Active Problem List  ? Diagnosis Date Noted  ? Hypophosphatemia 05/12/2021  ? Hypokalemia/hypomagnesemia/hypophosphatemia 05/11/2021  ? Low serum cortisol level 05/11/2021  ? Hypomagnesemia 05/11/2021  ? Pancytopenia ([place]) 05/11/2021  ? Metabolic acidosis 97/74/1423  ? Orthostatic hypotension 05/10/2021  ? FTT (failure to thrive) in adult 05/10/2021  ? Goals of care, counseling/discussion 03/18/2021  ? Hyponatremia 03/07/2021  ? Dehydration 03/07/2021  ? Dyspnea on exertion 03/07/2021  ? Encounter for antineoplastic  chemotherapy 01/16/2021  ? Encounter for antineoplastic immunotherapy 01/16/2021  ? Adenocarcinoma of right lung, stage 3 ([place]) 12/24/2020  ? Hemoptysis 12/11/2020  ? Rash 12/11/2020  ? Right lower lobe lung mass 12/05/2020  ? Insomnia 10/05/2020  ? Routine general medical examination at a health care facility 08/13/2016  ? Neck pain 11/26/2012  ? Physical deconditioning 08/24/2012  ? GERD (gastroesophageal reflux disease) 09/05/2010  ? Insulin dependent type 2 diabetes mellitus ([place]) 12/10/2009  ? Smokers' cough ([place]) 09/25/2008  ? SKIN CANCER, HX OF 09/25/2008  ? Essential hypertension 03/20/2008  ? CAD (coronary artery disease) 03/20/2008  ? Hyperlipidemia associated with type 2 diabetes mellitus ([place]) 12/23/2007  ? ?Home Medication(s) ?Prior to Admission medications   ?Medication Sig Start Date End Date Taking? Authorizing Provider  ?aspirin EC 81 MG tablet Take 81 mg by mouth in the morning. Swallow whole.    [provider]  ?blood glucose meter kit and supplies KIT Dispense based on patient and insurance preference. Use up to four times daily as directed. 03/09/21   Antonieta Pert, MD  ?folic acid (FOLVITE) 1 MG tablet Take 1 tablet (1 mg total) by mouth daily. 01/16/21   Curt Bears, MD  ?glucose blood (FREESTYLE LITE) test strip USE TO TEST BLOOD SUGAR TWICE DAILY ICD E11.49 05/16/21   Kayleen Memos, DO  ?HYDROcodone-acetaminophen (NORCO) 5-325 MG tablet Take 1 tablet by mouth every 6 (six) hours as needed for moderate pain. 03/18/21   Heilingoetter, [name], PA-C  ?hydrocortisone (CORTEF) 10 MG tablet Take 1 tablet (10 mg total) by mouth 2 (two) times daily. 05/21/21  06/20/21  Philemon Kingdom, MD  ?hydrocortisone sodium succinate (SOLU-CORTEF) 100 MG injection Inject 100 mg in the muscle as needed for adrenal crisis. 05/21/21   Philemon Kingdom, MD  ?ibuprofen (ADVIL) 200 MG tablet Take 200-400 mg by mouth every 6 (six) hours as needed for headache or mild pain.    [provider]   ?Melatonin 5 MG CHEW Chew 5 mg by mouth at bedtime as needed (for sleep).    [provider]  ?metFORMIN (GLUCOPHAGE) 1000 MG tablet TAKE 1 TABLET BY MOUTH TWICE DAILY WITH A MEAL ?Patient taking differently: Take 1,000 mg by mouth 2 (two) times daily with a meal. 09/14/20   [name], MD  ?metoprolol tartrate (LOPRESSOR) 25 MG tablet Take 0.5 tablets (12.5 mg total) by mouth 2 (two) times daily. 05/16/21 07/15/21  Kayleen Memos, DO  ?midodrine (PROAMATINE) 10 MG tablet Take 1 tablet (10 mg total) by mouth 3 (three) times daily with meals. 05/16/21 06/15/21  Kayleen Memos, DO  ?Multiple Vitamins-Minerals (CENTRUM SILVER 50+MEN) TABS Take 1 tablet by mouth daily with breakfast.    [provider]  ?omeprazole (PRILOSEC) 20 MG capsule TAKE 1 CAPSULE(20 MG) BY MOUTH TWICE DAILY BEFORE A MEAL ?Patient taking differently: Take 20 mg by mouth 2 (two) times daily before a meal. 10/02/20   [name], MD  ?prochlorperazine (COMPAZINE) 10 MG tablet Take 1 tablet (10 mg total) by mouth every 6 (six) hours as needed for nausea or vomiting. 02/21/21   Curt Bears, MD  ?simvastatin (ZOCOR) 40 MG tablet Take 1 tablet (40 mg total) by mouth daily at 6 PM. 06/26/20   [name], MD  ?traZODone (DESYREL) 50 MG tablet Take 1-2 tablets (50-100 mg total) by mouth at bedtime as needed for sleep. ?Patient taking differently: Take 100 mg by mouth at bedtime as needed for sleep. 12/28/20   [name], MD  ?                                                                                                                                  ?Past Surgical History ?Past Surgical History:  ?Procedure Laterality Date  ? BRONCHIAL BIOPSY  12/10/2020  ? Procedure: BRONCHIAL BIOPSIES;  Surgeon: Collene Gobble, MD;  Location: [facility] ENDOSCOPY;  Service: Pulmonary;;  ? BRONCHIAL BRUSHINGS  12/10/2020  ? Procedure: BRONCHIAL BRUSHINGS;  Surgeon: Collene Gobble, MD;  Location[facility] ENDOSCOPY;  Service:  Pulmonary;;  ? BRONCHIAL NEEDLE ASPIRATION BIOPSY  12/10/2020  ? Procedure: BRONCHIAL NEEDLE ASPIRATION BIOPSIES;  Surgeon: Collene Gobble, MD;  Location: [facility] ENDOSCOPY;  Service: Pulmonary;;  ? COLONOSCOPY    ? COLONOSCOPY W/ POLYPECTOMY  03/10/2009  ? LARYNGOSCOPY  03/10/1997  ? VIDEO BRONCHOSCOPY WITH ENDOBRONCHIAL NAVIGATION N/A 12/10/2020  ? Procedure: ROBOTIC VIDEO BRONCHOSCOPY WITH ENDOBRONCHIAL NAVIGATION;  Surgeon: Collene Gobble, MD;  Location: [place] ENDOSCOPY;  Service: Pulmonary;  Laterality: N/A;  ? ?Family  History ?Family History  ?Problem Relation Age of Onset  ? Lung cancer Mother   ?     X 2  ? Colon polyps Father   ? Colon cancer Father 42  ? Coronary artery disease Father   ? Lung disease Father   ? Esophageal cancer Neg Hx   ? Stomach cancer Neg Hx   ? Rectal cancer Neg Hx   ? ? ?Social History ?Social History  ? ?Tobacco Use  ? Smoking status: Every Day  ?  Packs/day: 0.25  ?  Years: 58.00  ?  Pack years: 14.50  ?  Types: Cigarettes  ? Smokeless tobacco: Never  ? Tobacco comments:  ?  2+ packs smoked daily [facility] 12/05/20  ?Vaping Use  ? Vaping Use: Never used  ?Substance Use Topics  ? Alcohol use: No  ?  Alcohol/week: 0.0 standard drinks  ?  Comment:  none since 04/2012  ? Drug use: Never  ? ?Allergies ?Penicillins ? ?Review of Systems ?Review of Systems  ?Neurological:  Positive for weakness.  ? ?Physical Exam ?Vital Signs  ?I have reviewed the triage vital signs ?BP 105/73   Pulse 97   Temp 98.1 ?F (36.7 ?C) (Oral)   Resp 16   SpO2 93%  ? ?Physical Exam ?Vitals and nursing note reviewed.  ?Constitutional:   ?   General: He is not in acute distress. ?   Appearance: He is well-developed.  ?HENT:  ?   Head: Normocephalic and atraumatic.  ?Eyes:  ?   Conjunctiva/sclera: Conjunctivae normal.  ?Cardiovascular:  ?   Rate and Rhythm: Normal rate and regular rhythm.  ?   Heart sounds: No murmur heard. ?Pulmonary:  ?   Effort: Pulmonary effort is normal. No respiratory distress.  ?   Breath sounds: Normal  breath sounds.  ?Abdominal:  ?   Palpations: Abdomen is soft.  ?   Tenderness: There is no abdominal tenderness.  ?Musculoskeletal:     ?   General: No swelling.  ?   Cervical back: Neck supple.  ?Skin: ?   General:

## 2021-05-24 ENCOUNTER — Ambulatory Visit: Payer: Medicare Other | Admitting: Internal Medicine

## 2021-05-24 ENCOUNTER — Ambulatory Visit: Payer: Medicare Other

## 2021-05-24 ENCOUNTER — Inpatient Hospital Stay (HOSPITAL_COMMUNITY): Payer: Medicare Other

## 2021-05-24 DIAGNOSIS — R42 Dizziness and giddiness: Secondary | ICD-10-CM

## 2021-05-24 DIAGNOSIS — C3491 Malignant neoplasm of unspecified part of right bronchus or lung: Secondary | ICD-10-CM | POA: Diagnosis not present

## 2021-05-24 DIAGNOSIS — R55 Syncope and collapse: Secondary | ICD-10-CM

## 2021-05-24 DIAGNOSIS — E2740 Unspecified adrenocortical insufficiency: Secondary | ICD-10-CM | POA: Diagnosis not present

## 2021-05-24 DIAGNOSIS — R531 Weakness: Secondary | ICD-10-CM | POA: Diagnosis not present

## 2021-05-24 DIAGNOSIS — E273 Drug-induced adrenocortical insufficiency: Secondary | ICD-10-CM | POA: Diagnosis not present

## 2021-05-24 LAB — LIPID PANEL
Cholesterol: 122 mg/dL (ref 0–200)
HDL: 35 mg/dL — ABNORMAL LOW (ref >40)
LDL Cholesterol: 55 mg/dL (ref 0–99)
Total CHOL/HDL Ratio: 3.5 RATIO
Triglycerides: 162 mg/dL — ABNORMAL HIGH (ref <150)
VLDL: 32 mg/dL (ref 0–40)

## 2021-05-24 LAB — COMPREHENSIVE METABOLIC PANEL
ALT: 11 U/L (ref 0–44)
AST: 14 U/L — ABNORMAL LOW (ref 15–41)
Albumin: 3.4 g/dL — ABNORMAL LOW (ref 3.5–5.0)
Alkaline Phosphatase: 42 U/L (ref 38–126)
Anion gap: 8 (ref 5–15)
BUN: 12 mg/dL (ref 8–23)
CO2: 23 mmol/L (ref 22–32)
Calcium: 8.6 mg/dL — ABNORMAL LOW (ref 8.9–10.3)
Chloride: 105 mmol/L (ref 98–111)
Creatinine, Ser: 0.7 mg/dL (ref 0.61–1.24)
GFR, Estimated: >60 mL/min (ref >60)
Glucose, Bld: 88 mg/dL (ref 70–99)
Potassium: 3.6 mmol/L (ref 3.5–5.1)
Sodium: 136 mmol/L (ref 135–145)
Total Bilirubin: 0.2 mg/dL — ABNORMAL LOW (ref 0.3–1.2)
Total Protein: 6.4 g/dL — ABNORMAL LOW (ref 6.5–8.1)

## 2021-05-24 LAB — GLUCOSE, CAPILLARY
Glucose-Capillary: 114 mg/dL — ABNORMAL HIGH (ref 70–99)
Glucose-Capillary: 138 mg/dL — ABNORMAL HIGH (ref 70–99)
Glucose-Capillary: 143 mg/dL — ABNORMAL HIGH (ref 70–99)
Glucose-Capillary: 149 mg/dL — ABNORMAL HIGH (ref 70–99)
Glucose-Capillary: 186 mg/dL — ABNORMAL HIGH (ref 70–99)
Glucose-Capillary: 83 mg/dL (ref 70–99)

## 2021-05-24 LAB — CBC
HCT: 28.9 % — ABNORMAL LOW (ref 39.0–52.0)
Hemoglobin: 9.8 g/dL — ABNORMAL LOW (ref 13.0–17.0)
MCH: 32.5 pg (ref 26.0–34.0)
MCHC: 33.9 g/dL (ref 30.0–36.0)
MCV: 95.7 fL (ref 80.0–100.0)
Platelets: 183 10*3/uL (ref 150–400)
RBC: 3.02 MIL/uL — ABNORMAL LOW (ref 4.22–5.81)
RDW: 15.8 % — ABNORMAL HIGH (ref 11.5–15.5)
WBC: 2.9 10*3/uL — ABNORMAL LOW (ref 4.0–10.5)
nRBC: 0 % (ref 0.0–0.2)

## 2021-05-24 LAB — CORTISOL-AM, BLOOD: Cortisol - AM: 8.3 ug/dL (ref 6.7–22.6)

## 2021-05-24 LAB — PHOSPHORUS: Phosphorus: 4 mg/dL (ref 2.5–4.6)

## 2021-05-24 LAB — MAGNESIUM: Magnesium: 1.4 mg/dL — ABNORMAL LOW (ref 1.7–2.4)

## 2021-05-24 LAB — TSH: TSH: 0.772 u[IU]/mL (ref 0.350–4.500)

## 2021-05-24 IMAGING — DX DG CHEST 1V PORT
1 series · 1 of 1 positions shown · non-contrast
Comparison: [DATE].

CLINICAL DATA: 71-year-old male presents for evaluation of
shortness of breath, history of lung cancer.

EXAM:
PORTABLE CHEST 1 VIEW

[chest ap]
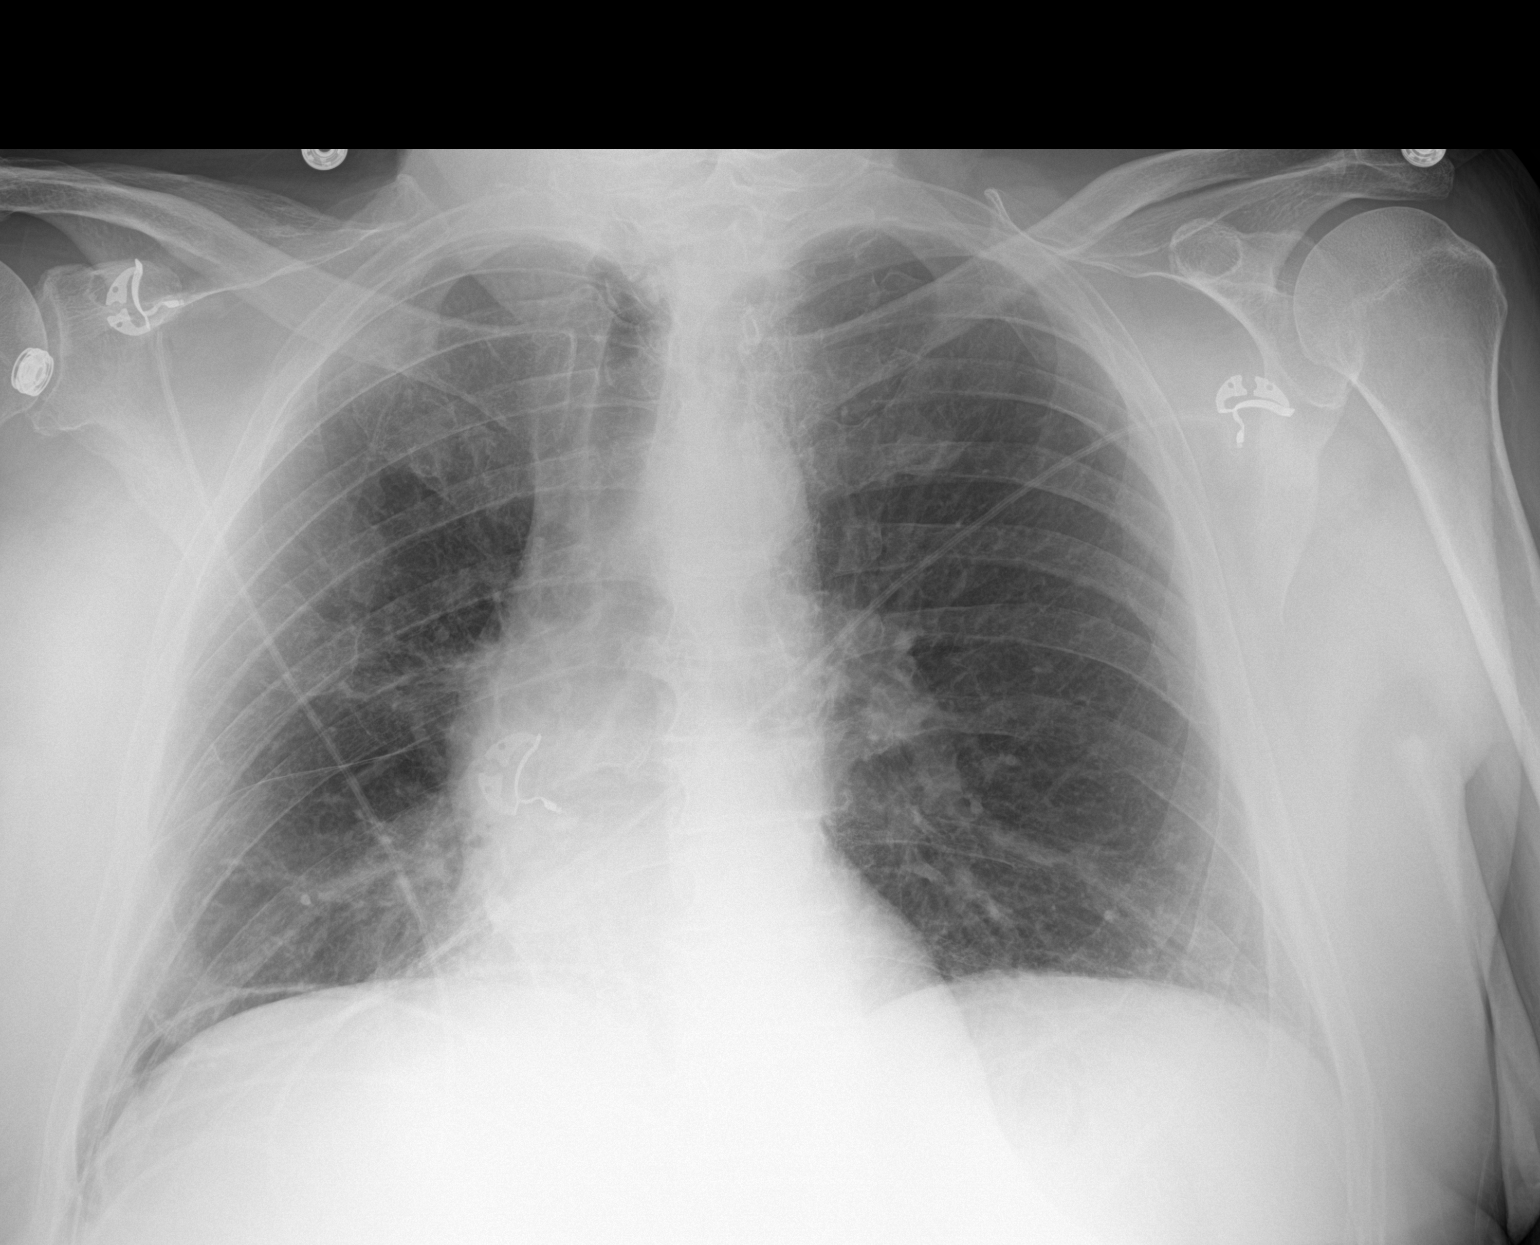

[1 of 1 positions shown; findings below may reference images not displayed]

FINDINGS: EKG leads project over the patient's chest.

Image rotated to the RIGHT. Accounting for this cardiomediastinal
contours and hilar structures are stable.

Mediastinal contours likely obscure the RIGHT lower lobe mass known
to be present and seen on prior imaging.

No lobar consolidation. No visible pneumothorax or pleural effusion.

On limited assessment there is no acute skeletal process.
IMPRESSION: No acute cardiopulmonary process.

Limited by rotation which likely obscures the known RIGHT lower lobe
mass.

## 2021-05-24 MED ORDER — MAGNESIUM SULFATE 50 % IJ SOLN
3.0000 g | Freq: Once | INTRAVENOUS | Status: AC
  Administered 2021-05-24: 3 g via INTRAVENOUS
  Filled 2021-05-24: qty 6

## 2021-05-24 MED ORDER — MIDODRINE HCL 5 MG PO TABS
10.0000 mg | ORAL_TABLET | Freq: Three times a day (TID) | ORAL | Status: DC
  Administered 2021-05-24 – 2021-05-27 (×8): 10 mg via ORAL
  Filled 2021-05-24 (×8): qty 2

## 2021-05-24 MED ORDER — SODIUM CHLORIDE 0.9 % IV SOLN: INTRAVENOUS | Status: DC

## 2021-05-24 MED ORDER — MIDODRINE HCL 5 MG PO TABS: 5.0000 mg | ORAL_TABLET | Freq: Three times a day (TID) | ORAL | Status: DC

## 2021-05-24 NOTE — Progress Notes (Signed)
?PROGRESS NOTE ? ? ? ?Brandon Rubio.  XTG:[id number] DOB: 12/22/1950 DOA: 05/23/2021 ?PCP: [name], MD  ? ? ? ?Brief Narrative:  ?Brandon Rubio. is a 71 y.o.  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  ?Presents emergency department for evaluation of weakness and fatigue.  Patient has had 2 prolonged hospitalizations for similar presentation and ultimately followed up with patient's outpatient endocrinologist Dr. Cruzita Lederer who recommended a slow transition down from his 15 mg a.m. and 10 mg p.m. to 10 mg and 10 mg.  He states that over the last 3 days he has noticed a noticeable change in his ability to move and states that he is so weak that he could not make his radiation appointment today.  He denies chest pain, shortness breath, abdominal pain, nausea, vomiting or other systemic symptoms ? ? ?Subjective: ?3/17 afebrile overnight states continued fatigue, lightheadedness upon standing.  Positive SOB ? ? ?Assessment & Plan: ?Covid vaccination; vaccinated ?  ?Principal Problem: ?  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  Insulin dependent type 2 diabetes mellitus (Brandon Rubio) ?  Hyperlipidemia associated with type 2 diabetes mellitus (Brandon Rubio) ?  Essential hypertension ?  CAD (coronary artery disease) ?  Adenocarcinoma of right lung, stage 3 (Brandon Rubio) ?  Adrenal insufficiency (Brandon Rubio) ?  Anemia, unspecified ?  Postural dizziness with presyncope ? ? ? 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 Renne Crigler Endocrinology 50 mg IV Solu-Cortef now,  ?3/17 Solu-Cortef 50 mg IV BID, then transition back to his old dosing of 15 and 10 if symptoms improved. ? ?Fatigue/SOB/presyncope ?- 3/17 discussed at length with patient that if not secondary to adrenal insufficiency, may be secondary to side effects of chemotherapy and  radiation treatment. ?- 3/17 will rule out any secondary causes. ?- 3/17 blood culture, urine culture pending ?- 3/17 PCXR pending ?- 3/17 orthostatic vitals ?-3/17 TSH ?- 3/17 continuous pulse ox ?  ?DM type 2 insulin-dependent ?-3/4 hemoglobin A1c= 5.9 ?-Will hold Metformin ?-Moderate SSI ?-3/17 hemoglobin A1c pending ? ?HLD associated with DM type II ?- 3/17 LDL= 55.  At goal ? ?Essential HTN/Orthostatic Hypotension ?-3/17 patient orthostatic and symptomatic. ?- 3/17 normal saline 130ml/hr ?-3/17 Midodrine 10 mg TID ? ?CAD ?- ?  ?Adenocarcinoma of right lung stage III ?- Per Dr. Earlie Server oncology note from 2/28,disease progression with enlarging mass and new right hilar and mediastinal lymph nodes in December 2022 ?-Current therapy as of 2/28 Concurrent chemoradiation with carboplatin for an AUC of 2 and paclitaxel 45 mg per metered squared.  First dose expected on 04/01/2021.  Status post 4 cycles ?- Currently receiving radiation per patient appears received last dose on 3/13 ? ?Hypomagnesmia ?- Magnesium goal> 2 ?- 3/17 Magnesium IV 3 g ? ? ?  ? ? ?Mobility Assessment (last 72 hours)   ? ? Mobility Assessment   ? ? Brandon Rubio Name 05/24/21 0815 05/23/21 2215 05/23/21 1820  ?  ?  ? Does patient have an order for bedrest or is patient medically unstable No - Continue assessment No - Continue assessment No - Continue assessment    ? What is the highest level of mobility based on the progressive mobility assessment? Level 6 (Walks independently in room and hall) - Balance while walking in room without assist - Complete Level 6 (Walks independently in room and hall) - Balance while  walking in room without assist - Complete Level 6 (Walks independently in room and hall) - Balance while walking in room without assist - Complete    ? Is the above level different from baseline mobility prior to current illness? No - Consider discontinuing PT/OT -- No - Consider discontinuing PT/OT    ? ?  ?  ? ?  ? ? ?DVT prophylaxis:  Lovenox ?Code Status: Full ?Family Communication:  ?Status is: Inpatient ? ? ? ?Dispo: The patient is from: Home ?             Anticipated d/c is to: Home ?             Anticipated d/c date is: 2 days ?             Patient currently is not medically stable to d/c. ? ? ? ? ? ?Consultants:  ?Dr. Cruzita Lederer Endocrinology ? ?Procedures/Significant Events:  ? ? ?I have personally reviewed and interpreted all radiology studies and my findings are as above. ? ?VENTILATOR SETTINGS: ? ? ? ?Cultures ?3/16 influenza A/B negative ? ?Antimicrobials: ? ? ? ?Devices ?  ? ?LINES / TUBES:  ? ? ? ? ?Continuous Infusions: ? sodium chloride    ? ? ? ?Objective: ?Vitals:  ? 05/23/21 1727 05/23/21 1822 05/23/21 1956 05/24/21 1311  ?BP:  129/68 113/65 112/65  ?Pulse:  (!) 102 (!) 105 (!) 104  ?Resp:  20 18 18   ?Temp: 98.5 ?F (36.9 ?C) 97.6 ?F (36.4 ?C) 98.2 ?F (36.8 ?C) 98 ?F (36.7 ?C)  ?TempSrc: Oral Oral Oral Oral  ?SpO2:  95% 96% 95%  ?Weight:  89.5 kg    ?Height:  6' (1.829 m)    ? ? ?Intake/Output Summary (Last 24 hours) at 05/24/2021 1900 ?Last data filed at 05/24/2021 1638 ?Gross per 24 hour  ?Intake 153.51 ml  ?Output --  ?Net 153.51 ml  ? ?Filed Weights  ? 05/23/21 1822  ?Weight: 89.5 kg  ? ? ?Examination: ? ?General: A/O x4, No acute respiratory distress (feels SOB) ?Eyes: negative scleral hemorrhage, negative anisocoria, negative icterus ?ENT: Negative Runny nose, negative gingival bleeding, ?Neck:  Negative scars, masses, torticollis, lymphadenopathy, JVD ?Lungs: Clear to auscultation bilaterally without wheezes or crackles ?Cardiovascular: Regular rate and rhythm without murmur gallop or rub normal S1 and S2 ?Abdomen: negative abdominal pain, nondistended, positive soft, bowel sounds, no rebound, no ascites, no appreciable mass ?Extremities: No significant cyanosis, clubbing, or edema bilateral lower extremities ?Skin: Negative rashes, lesions, ulcers ?Psychiatric:  Negative depression, negative anxiety, negative fatigue, negative  mania  ?Central nervous system:  Cranial nerves II through XII intact, tongue/uvula midline, all extremities muscle strength 5/5, sensation intact throughout, negative dysarthria, negative expressive aphasia, negative receptive aphasia. ? ?.  ? ? ? ?Data Reviewed: Care during the described time interval was provided by me .  I have reviewed this patient's available data, including medical history, events of note, physical examination, and all test results as part of my evaluation. ? ?CBC: ?Recent Labs  ?Lab 05/23/21 ?1416 05/24/21 ?0352  ?WBC 6.0 2.9*  ?NEUTROABS 4.5  --   ?HGB 10.7* 9.8*  ?HCT 31.0* 28.9*  ?MCV 95.4 95.7  ?PLT 190 183  ? ?Basic Metabolic Panel: ?Recent Labs  ?Lab 05/23/21 ?1416 05/24/21 ?0352  ?NA 136 136  ?K 3.5 3.6  ?CL 99 105  ?CO2 25 23  ?GLUCOSE 143* 88  ?BUN 12 12  ?CREATININE 0.98 0.70  ?CALCIUM 8.8* 8.6*  ?MG 1.1* 1.4*  ?PHOS  --  4.0  ? ?GFR: ?Estimated Creatinine Clearance: 90.3 mL/min (by C-G formula based on SCr of 0.7 mg/dL). ?Liver Function Tests: ?Recent Labs  ?Lab 05/23/21 ?1416 05/24/21 ?0352  ?AST 17 14*  ?ALT 11 11  ?ALKPHOS 50 42  ?BILITOT 0.4 0.2*  ?PROT 7.4 6.4*  ?ALBUMIN 4.0 3.4*  ? ?No results for input(s): LIPASE, AMYLASE in the last 168 hours. ?No results for input(s): AMMONIA in the last 168 hours. ?Coagulation Profile: ?No results for input(s): INR, PROTIME in the last 168 hours. ?Cardiac Enzymes: ?No results for input(s): CKTOTAL, CKMB, CKMBINDEX, TROPONINI in the last 168 hours. ?BNP (last 3 results) ?No results for input(s): PROBNP in the last 8760 hours. ?HbA1C: ?No results for input(s): HGBA1C in the last 72 hours. ?CBG: ?Recent Labs  ?Lab 05/24/21 ?0020 05/24/21 ?3354 05/24/21 ?0727 05/24/21 ?1139 05/24/21 ?1640  ?GLUCAP 114* 83 143* 186* 149*  ? ?Lipid Profile: ?Recent Labs  ?  05/24/21 ?0352  ?CHOL 122  ?HDL 35*  ?[place] 55  ?TRIG 162*  ?CHOLHDL 3.5  ? ?Thyroid Function Tests: ?Recent Labs  ?  05/24/21 ?1608  ?TSH 0.772  ? ?Anemia Panel: ?Recent Labs  ?   05/23/21 ?1416  ?VITAMINB12 601  ?FOLATE 71.5  ?FERRITIN 375*  ?TIBC 303  ?IRON 54  ?RETICCTPCT 2.8  ? ?Sepsis Labs: ?No results for input(s): PROCALCITON, LATICACIDVEN in the last 168 hours. ? ?Recent Results (from the pas

## 2021-05-25 ENCOUNTER — Other Ambulatory Visit: Payer: Self-pay | Admitting: Internal Medicine

## 2021-05-25 DIAGNOSIS — C3491 Malignant neoplasm of unspecified part of right bronchus or lung: Secondary | ICD-10-CM | POA: Diagnosis not present

## 2021-05-25 DIAGNOSIS — E2740 Unspecified adrenocortical insufficiency: Secondary | ICD-10-CM | POA: Diagnosis not present

## 2021-05-25 DIAGNOSIS — E273 Drug-induced adrenocortical insufficiency: Secondary | ICD-10-CM | POA: Diagnosis not present

## 2021-05-25 DIAGNOSIS — R531 Weakness: Secondary | ICD-10-CM | POA: Diagnosis not present

## 2021-05-25 LAB — CBC
HCT: 28.9 % — ABNORMAL LOW (ref 39.0–52.0)
Hemoglobin: 9.8 g/dL — ABNORMAL LOW (ref 13.0–17.0)
MCH: 32.7 pg (ref 26.0–34.0)
MCHC: 33.9 g/dL (ref 30.0–36.0)
MCV: 96.3 fL (ref 80.0–100.0)
Platelets: 188 10*3/uL (ref 150–400)
RBC: 3 MIL/uL — ABNORMAL LOW (ref 4.22–5.81)
RDW: 15.9 % — ABNORMAL HIGH (ref 11.5–15.5)
WBC: 3.7 10*3/uL — ABNORMAL LOW (ref 4.0–10.5)
nRBC: 0 % (ref 0.0–0.2)

## 2021-05-25 LAB — COMPREHENSIVE METABOLIC PANEL
ALT: 10 U/L (ref 0–44)
AST: 13 U/L — ABNORMAL LOW (ref 15–41)
Albumin: 3.3 g/dL — ABNORMAL LOW (ref 3.5–5.0)
Alkaline Phosphatase: 40 U/L (ref 38–126)
Anion gap: 7 (ref 5–15)
BUN: 16 mg/dL (ref 8–23)
CO2: 27 mmol/L (ref 22–32)
Calcium: 8.5 mg/dL — ABNORMAL LOW (ref 8.9–10.3)
Chloride: 104 mmol/L (ref 98–111)
Creatinine, Ser: 0.82 mg/dL (ref 0.61–1.24)
GFR, Estimated: >60 mL/min (ref >60)
Glucose, Bld: 90 mg/dL (ref 70–99)
Potassium: 3.4 mmol/L — ABNORMAL LOW (ref 3.5–5.1)
Sodium: 138 mmol/L (ref 135–145)
Total Bilirubin: 0.4 mg/dL (ref 0.3–1.2)
Total Protein: 6.3 g/dL — ABNORMAL LOW (ref 6.5–8.1)

## 2021-05-25 LAB — GLUCOSE, CAPILLARY
Glucose-Capillary: 100 mg/dL — ABNORMAL HIGH (ref 70–99)
Glucose-Capillary: 102 mg/dL — ABNORMAL HIGH (ref 70–99)
Glucose-Capillary: 108 mg/dL — ABNORMAL HIGH (ref 70–99)
Glucose-Capillary: 124 mg/dL — ABNORMAL HIGH (ref 70–99)
Glucose-Capillary: 130 mg/dL — ABNORMAL HIGH (ref 70–99)
Glucose-Capillary: 150 mg/dL — ABNORMAL HIGH (ref 70–99)
Glucose-Capillary: 174 mg/dL — ABNORMAL HIGH (ref 70–99)

## 2021-05-25 LAB — PHOSPHORUS: Phosphorus: 3.3 mg/dL (ref 2.5–4.6)

## 2021-05-25 LAB — MAGNESIUM: Magnesium: 2 mg/dL (ref 1.7–2.4)

## 2021-05-25 MED ORDER — ALBUMIN HUMAN 25 % IV SOLN
50.0000 g | Freq: Once | INTRAVENOUS | Status: AC
  Administered 2021-05-25: 25 g via INTRAVENOUS
  Filled 2021-05-25: qty 200

## 2021-05-25 MED ORDER — DIPHENHYDRAMINE HCL 25 MG PO CAPS
25.0000 mg | ORAL_CAPSULE | Freq: Two times a day (BID) | ORAL | Status: DC | PRN
  Administered 2021-05-25: 25 mg via ORAL
  Filled 2021-05-25: qty 1

## 2021-05-25 MED ORDER — POTASSIUM CHLORIDE CRYS ER 20 MEQ PO TBCR
50.0000 meq | EXTENDED_RELEASE_TABLET | Freq: Once | ORAL | Status: AC
  Administered 2021-05-25: 50 meq via ORAL
  Filled 2021-05-25: qty 1

## 2021-05-25 NOTE — Progress Notes (Signed)
?PROGRESS NOTE ? ? ? ?Brandon Rubio.  IPJ:[id number] DOB: Sep 06, 1950 DOA: 05/23/2021 ?PCP: [name], MD  ? ? ? ?Brief Narrative:  ?Brandon Mcnelly. is a 71 y.o.  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  ?Presents emergency department for evaluation of weakness and fatigue.  Patient has had 2 prolonged hospitalizations for similar presentation and ultimately followed up with patient's outpatient endocrinologist Dr. Cruzita Lederer who recommended a slow transition down from his 15 mg a.m. and 10 mg p.m. to 10 mg and 10 mg.  He states that over the last 3 days he has noticed a noticeable change in his ability to move and states that he is so weak that he could not make his radiation appointment today.  He denies chest pain, shortness breath, abdominal pain, nausea, vomiting or other systemic symptoms ? ? ?Subjective: ?3/18 A/O x4.  Patient having a reaction to albumin.  Breaking out in rash, hives.  Negative SOB ? ? ?Assessment & Plan: ?Covid vaccination; vaccinated ?  ?Principal Problem: ?  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  Insulin dependent type 2 diabetes mellitus ([place]) ?  Hyperlipidemia associated with type 2 diabetes mellitus ([place]) ?  Essential hypertension ?  CAD (coronary artery disease) ?  Adenocarcinoma of right lung, stage 3 ([facility]) ?  Adrenal insufficiency ([place]) ?  Anemia, unspecified ?  Postural dizziness with presyncope ? ? ? 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 Renne Crigler Endocrinology 50 mg IV Solu-Cortef now,  ?3/17 Solu-Cortef 50 mg IV BID, then transition back to his old dosing of 15 and 10 if symptoms improved. ? ?Fatigue/SOB/presyncope ?- 3/17 discussed at length with patient that if not secondary to adrenal insufficiency, may be secondary to side effects of chemotherapy and  radiation treatment. ?- 3/17 will rule out any secondary causes. ?- 3/17 blood culture, urine culture pending ?- 3/17 PCXR pending ?- 3/17 orthostatic vitals ?-3/17 TSH ?- 3/17 continuous pulse ox ?  ?DM type 2 insulin-dependent ?-3/4 hemoglobin A1c= 5.9 ?-Will hold Metformin ?-Moderate SSI ?-3/17 hemoglobin A1c pending ? ?HLD associated with DM type II ?- 3/17 LDL= 55.  At goal ? ?Essential HTN/Orthostatic Hypotension ?-3/17 patient orthostatic and symptomatic. ?- 3/17 normal saline 179ml/hr ?-3/17 Midodrine 10 mg TID ?-3/18 Albumin 50 g x 1 ? ?CAD ?- ?  ?Adenocarcinoma of right lung stage III ?- Per Dr. Earlie Server oncology note from 2/28,disease progression with enlarging mass and new right hilar and mediastinal lymph nodes in December 2022 ?-Current therapy as of 2/28 Concurrent chemoradiation with carboplatin for an AUC of 2 and paclitaxel 45 mg per metered squared.  First dose expected on 04/01/2021.  Status post 4 cycles ?- Currently receiving radiation per patient appears received last dose on 3/13 ? ?Hypomagnesmia ?- Magnesium goal> 2 ?- 3/17 Magnesium IV 3 g ? ?Hypokalemia ?- Potassium goal> 4 ?- 3/18 K-Dur 50 mEq ? ? ?  ? ? ?Mobility Assessment (last 72 hours)   ? ? Mobility Assessment   ? ? [place] Name 05/25/21 0900 05/24/21 2030 05/24/21 0815 05/23/21 2215 05/23/21 1820  ? Does patient have an order for bedrest or is patient medically unstable No - Continue assessment No - Continue assessment No - Continue assessment No - Continue assessment No - Continue assessment  ? What is the highest level of mobility based on the progressive mobility  assessment? Level 6 (Walks independently in room and hall) - Balance while walking in room without assist - Complete Level 6 (Walks independently in room and hall) - Balance while walking in room without assist - Complete Level 6 (Walks independently in room and hall) - Balance while walking in room without assist - Complete Level 6 (Walks independently in room and hall) -  Balance while walking in room without assist - Complete Level 6 (Walks independently in room and hall) - Balance while walking in room without assist - Complete  ? Is the above level different from baseline mobility prior to current illness? No - Consider discontinuing PT/OT -- No - Consider discontinuing PT/OT -- No - Consider discontinuing PT/OT  ? ?  ?  ? ?  ? ? ?DVT prophylaxis: Lovenox ?Code Status: Full ?Family Communication:  ?Status is: Inpatient ? ? ? ?Dispo: The patient is from: Home ?             Anticipated d/c is to: Home ?             Anticipated d/c date is: 2 days ?             Patient currently is not medically stable to d/c. ? ? ? ? ? ?Consultants:  ?Dr. Cruzita Lederer Endocrinology ? ?Procedures/Significant Events:  ?3/17 PCXR: ?Image rotated to the RIGHT. Accounting for this cardiomediastinal ?contours and hilar structures are stable. ? ?Mediastinal contours likely obscure the RIGHT lower lobe mass known ?to be present and seen on prior imaging. ? ?No lobar consolidation. No visible pneumothorax or pleural effusion. ? ?On limited assessment there is no acute skeletal process.  ? ? ? ?I have personally reviewed and interpreted all radiology studies and my findings are as above. ? ?VENTILATOR SETTINGS: ?Room air 3/18 ?SPO2 94% ? ? ?Cultures ?3/16 influenza A/B negative ?3/16 SARS coronavirus negative ?3/17 urine pending ?3/17 blood LEFT AC NGTD ?3/17 LEFT wrist NGTD ? ? ?Antimicrobials: ? ? ? ?Devices ?  ? ?LINES / TUBES:  ? ? ? ? ?Continuous Infusions: ? sodium chloride 100 mL/hr at 05/24/21 2050  ? ? ? ?Objective: ?Vitals:  ? 05/23/21 1956 05/24/21 1311 05/24/21 2008 05/25/21 0421  ?BP: 113/65 112/65 102/65 118/70  ?Pulse: (!) 105 (!) 104 88 82  ?Resp: 18 18 18 20   ?Temp: 98.2 ?F (36.8 ?C) 98 ?F (36.7 ?C) 98 ?F (36.7 ?C) 97.7 ?F (36.5 ?C)  ?TempSrc: Oral Oral Oral Oral  ?SpO2: 96% 95% 95% 94%  ?Weight:      ?Height:      ? ? ?Intake/Output Summary (Last 24 hours) at 05/25/2021 1053 ?Last data filed at  05/25/2021 0555 ?Gross per 24 hour  ?Intake 822.23 ml  ?Output --  ?Net 822.23 ml  ? ? ?Filed Weights  ? 05/23/21 1822  ?Weight: 89.5 kg  ? ? ?Examination: ? ?General: A/O x4, No acute respiratory distress (feels SOB) ?Eyes: negative scleral hemorrhage, negative anisocoria, negative icterus ?ENT: Negative Runny nose, negative gingival bleeding, ?Neck:  Negative scars, masses, torticollis, lymphadenopathy, JVD ?Lungs: Clear to auscultation bilaterally without wheezes or crackles ?Cardiovascular: Regular rate and rhythm without murmur gallop or rub normal S1 and S2 ?Abdomen: negative abdominal pain, nondistended, positive soft, bowel sounds, no rebound, no ascites, no appreciable mass ?Extremities: No significant cyanosis, clubbing, or edema bilateral lower extremities ?Skin: Negative rashes, lesions, ulcers ?Psychiatric:  Negative depression, negative anxiety, negative fatigue, negative mania  ?Central nervous system:  Cranial nerves II through XII intact, tongue/uvula midline, all  extremities muscle strength 5/5, sensation intact throughout, negative dysarthria, negative expressive aphasia, negative receptive aphasia. ? ?.  ? ? ? ?Data Reviewed: Care during the described time interval was provided by me .  I have reviewed this patient's available data, including medical history, events of note, physical examination, and all test results as part of my evaluation. ? ?CBC: ?Recent Labs  ?Lab 05/23/21 ?1416 05/24/21 ?0352 05/25/21 ?0336  ?WBC 6.0 2.9* 3.7*  ?NEUTROABS 4.5  --   --   ?HGB 10.7* 9.8* 9.8*  ?HCT 31.0* 28.9* 28.9*  ?MCV 95.4 95.7 96.3  ?PLT 190 183 188  ? ? ?Basic Metabolic Panel: ?Recent Labs  ?Lab 05/23/21 ?1416 05/24/21 ?0352 05/25/21 ?0336  ?NA 136 136 138  ?K 3.5 3.6 3.4*  ?CL 99 105 104  ?CO2 25 23 27   ?GLUCOSE 143* 88 90  ?BUN 12 12 16   ?CREATININE 0.98 0.70 0.82  ?CALCIUM 8.8* 8.6* 8.5*  ?MG 1.1* 1.4* 2.0  ?PHOS  --  4.0 3.3  ? ? ?GFR: ?Estimated Creatinine Clearance: 88.1 mL/min (by C-G formula  based on SCr of 0.82 mg/dL). ?Liver Function Tests: ?Recent Labs  ?Lab 05/23/21 ?1416 05/24/21 ?0352 05/25/21 ?0336  ?AST 17 14* 13*  ?ALT 11 11 10   ?ALKPHOS 50 42 40  ?BILITOT 0.4 0.2* 0.4  ?PROT 7.4 6.4* 6.3*  ?A

## 2021-05-25 NOTE — Progress Notes (Signed)
Pt. Complained of itching all over his body and face. Pt. Had develop hives and rashes on his arms, chest and back likely due to albumin infusion. Checked vitals, within the normal limits, no signs of distress, no SOB. Stopped Albumin infusion, notified provider and carried new orders. Reassessed, Itching and hives getting better. Pt. Lying comfortably in bed with No new complains. Will continue to monitor pt.  ?

## 2021-05-25 NOTE — Plan of Care (Signed)
  Problem: Clinical Measurements: Goal: Ability to maintain clinical measurements within normal limits will improve Outcome: Progressing   Problem: Activity: Goal: Risk for activity intolerance will decrease Outcome: Progressing   Problem: Safety: Goal: Ability to remain free from injury will improve Outcome: Progressing   

## 2021-05-26 DIAGNOSIS — C3491 Malignant neoplasm of unspecified part of right bronchus or lung: Secondary | ICD-10-CM | POA: Diagnosis not present

## 2021-05-26 DIAGNOSIS — E273 Drug-induced adrenocortical insufficiency: Secondary | ICD-10-CM | POA: Diagnosis not present

## 2021-05-26 DIAGNOSIS — E2740 Unspecified adrenocortical insufficiency: Secondary | ICD-10-CM | POA: Diagnosis not present

## 2021-05-26 DIAGNOSIS — R531 Weakness: Secondary | ICD-10-CM | POA: Diagnosis not present

## 2021-05-26 LAB — COMPREHENSIVE METABOLIC PANEL
ALT: 16 U/L (ref 0–44)
AST: 20 U/L (ref 15–41)
Albumin: 3.4 g/dL — ABNORMAL LOW (ref 3.5–5.0)
Alkaline Phosphatase: 39 U/L (ref 38–126)
Anion gap: 8 (ref 5–15)
BUN: 16 mg/dL (ref 8–23)
CO2: 22 mmol/L (ref 22–32)
Calcium: 8.7 mg/dL — ABNORMAL LOW (ref 8.9–10.3)
Chloride: 109 mmol/L (ref 98–111)
Creatinine, Ser: 0.81 mg/dL (ref 0.61–1.24)
GFR, Estimated: >60 mL/min (ref >60)
Glucose, Bld: 135 mg/dL — ABNORMAL HIGH (ref 70–99)
Potassium: 3.9 mmol/L (ref 3.5–5.1)
Sodium: 139 mmol/L (ref 135–145)
Total Bilirubin: 0.2 mg/dL — ABNORMAL LOW (ref 0.3–1.2)
Total Protein: 6.3 g/dL — ABNORMAL LOW (ref 6.5–8.1)

## 2021-05-26 LAB — CBC
HCT: 28 % — ABNORMAL LOW (ref 39.0–52.0)
Hemoglobin: 9.6 g/dL — ABNORMAL LOW (ref 13.0–17.0)
MCH: 33.4 pg (ref 26.0–34.0)
MCHC: 34.3 g/dL (ref 30.0–36.0)
MCV: 97.6 fL (ref 80.0–100.0)
Platelets: 199 10*3/uL (ref 150–400)
RBC: 2.87 MIL/uL — ABNORMAL LOW (ref 4.22–5.81)
RDW: 15.7 % — ABNORMAL HIGH (ref 11.5–15.5)
WBC: 5.1 10*3/uL (ref 4.0–10.5)
nRBC: 0 % (ref 0.0–0.2)

## 2021-05-26 LAB — PHOSPHORUS: Phosphorus: 3 mg/dL (ref 2.5–4.6)

## 2021-05-26 LAB — GLUCOSE, CAPILLARY
Glucose-Capillary: 137 mg/dL — ABNORMAL HIGH (ref 70–99)
Glucose-Capillary: 100 mg/dL — ABNORMAL HIGH (ref 70–99)
Glucose-Capillary: 188 mg/dL — ABNORMAL HIGH (ref 70–99)
Glucose-Capillary: 98 mg/dL (ref 70–99)
Glucose-Capillary: 133 mg/dL — ABNORMAL HIGH (ref 70–99)

## 2021-05-26 LAB — URINE CULTURE: Culture: NO GROWTH

## 2021-05-26 LAB — MAGNESIUM: Magnesium: 2 mg/dL (ref 1.7–2.4)

## 2021-05-26 MED ORDER — SODIUM CHLORIDE 0.9 % IV SOLN: INTRAVENOUS | Status: AC

## 2021-05-26 NOTE — Progress Notes (Addendum)
?PROGRESS NOTE ? ? ? ?Brandon Rubio.  MWN:[id number] DOB: [birth year] DOA: 05/23/2021 ?PCP: [name], MD  ? ? ? ?Brief Narrative:  ?Brandon Jamar. is a 71 y.o.  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  ?Presents emergency department for evaluation of weakness and fatigue.  Patient has had 2 prolonged hospitalizations for similar presentation and ultimately followed up with patient's outpatient endocrinologist Dr. Philemon Kingdom [facility] Endocrinology who recommended a slow transition down from his 15 mg a.m. and 10 mg p.m. to 10 mg and 10 mg.  He states that over the last 3 days he has noticed a noticeable change in his ability to move and states that he is so weak that he could not make his radiation appointment today.  He denies chest pain, shortness breath, abdominal pain, nausea, vomiting or other systemic symptoms ? ? ?Subjective: ?3/19 A/O x4.  Resting comfortably.  States has radiation therapy 3/20 ? ? ? ?Assessment & Plan: ?Covid vaccination; vaccinated ?  ?Principal Problem: ?  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  Insulin dependent type 2 diabetes mellitus ([place]) ?  Hyperlipidemia associated with type 2 diabetes mellitus ([place]) ?  Essential hypertension ?  CAD (coronary artery disease) ?  Adenocarcinoma of right lung, stage 3 (HCC) ?  Adrenal insufficiency ([place]) ?  Anemia, unspecified ?  Postural dizziness with presyncope ? ? ? 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 Renne Crigler Endocrinology 50 mg IV Solu-Cortef now,  ?3/17 Solu-Cortef 50 mg IV BID, then transition back to his old dosing of 15 and 10 if symptoms improved. ? ?Fatigue/SOB/presyncope ?- 3/17 discussed at length with patient that if not secondary to adrenal insufficiency, may be secondary to side effects of chemotherapy and  radiation treatment. ?- 3/17 will rule out any secondary causes. ?- 3/17 blood culture, urine culture in NGTD ?- 3/17 PCXR stable see results below ?- 3/17 orthostatic vitals ?-3/17 TSH= 0.77 ?- 3/17 continuous pulse ox ? ?Essential HTN/Orthostatic Hypotension ?-3/17 patient orthostatic and symptomatic. ?- 3/17 normal saline 169ml/hr ?-3/17 Midodrine 10 mg TID ?-3/18 Albumin 50 g x 1 ?-3/19 patient remains orthostatic hypotensive ?-3/19 bolus normal saline 3 L@250ml /hr----> normal saline 190ml/hr ? ?CAD ?  ?DM type 2 insulin-dependent ?-3/4 hemoglobin A1c= 5.9 ?-Will hold Metformin ?-Moderate SSI ?-3/17 hemoglobin A1c pending ? ?HLD associated with DM type II ?- 3/17 LDL= 55.  At goal ? ?Adenocarcinoma of right lung stage III ?- Per Dr. Earlie Server oncology note from 2/28,disease progression with enlarging mass and new right hilar and mediastinal lymph nodes in December 2022 ?-Current therapy as of 2/28 Concurrent chemoradiation with carboplatin for an AUC of 2 and paclitaxel 45 mg per metered squared.  First dose expected on 04/01/2021.  Status post 4 cycles ?- Currently receiving radiation per patient appears received last dose on 3/13 ? ?Hypomagnesmia ?- Magnesium goal> 2 ?- 3/17 Magnesium IV 3 g ? ?Hypokalemia ?- Potassium goal> 4 ?- 3/18 K-Dur 50 mEq ? ? ?  ? ? ?Mobility Assessment (last 72 hours)   ? ? Mobility Assessment   ? ? Richards Name 05/26/21 0930 05/25/21 0900 05/24/21 2030 05/24/21 0815 05/23/21 2215  ? Does patient have an order for bedrest or is patient medically unstable No - Continue assessment No - Continue assessment No - Continue assessment No - Continue assessment No - Continue assessment  ?  What is the highest level of mobility based on the progressive mobility assessment? Level 6 (Walks independently in room and hall) - Balance while walking in room without assist - Complete Level 6 (Walks independently in room and hall) - Balance while walking in room without assist - Complete Level 6 (Walks  independently in room and hall) - Balance while walking in room without assist - Complete Level 6 (Walks independently in room and hall) - Balance while walking in room without assist - Complete Level 6 (Walks independently in room and hall) - Balance while walking in room without assist - Complete  ? Is the above level different from baseline mobility prior to current illness? No - Consider discontinuing PT/OT No - Consider discontinuing PT/OT -- No - Consider discontinuing PT/OT --  ? ? Mercedes Name 05/23/21 1820  ?  ?  ?  ?  ? Does patient have an order for bedrest or is patient medically unstable No - Continue assessment      ? What is the highest level of mobility based on the progressive mobility assessment? Level 6 (Walks independently in room and hall) - Balance while walking in room without assist - Complete      ? Is the above level different from baseline mobility prior to current illness? No - Consider discontinuing PT/OT      ? ?  ?  ? ?  ? ? ?DVT prophylaxis: Lovenox ?Code Status: Full ?Family Communication:  ?Status is: Inpatient ? ? ? ?Dispo: The patient is from: Home ?             Anticipated d/c is to: Home ?             Anticipated d/c date is: 2 days ?             Patient currently is not medically stable to d/c. ? ? ? ? ? ?Consultants:  ?Dr. Peyton Najjar Endocrinology ? ? ?Procedures/Significant Events:  ?3/17 PCXR: ?Image rotated to the RIGHT. Accounting for this cardiomediastinal ?contours and hilar structures are stable. ? ?Mediastinal contours likely obscure the RIGHT lower lobe mass known ?to be present and seen on prior imaging. ? ?No lobar consolidation. No visible pneumothorax or pleural effusion. ? ?On limited assessment there is no acute skeletal process.  ? ? ? ?I have personally reviewed and interpreted all radiology studies and my findings are as above. ? ?VENTILATOR SETTINGS: ?Room air 3/18 ?SPO2 94% ? ? ?Cultures ?3/16 influenza A/B negative ?3/16 SARS coronavirus  negative ?3/17 urine pending ?3/17 blood LEFT AC NGTD ?3/17 LEFT wrist NGTD ? ? ?Antimicrobials: ? ? ? ?Devices ?  ? ?LINES / TUBES:  ? ? ? ? ?Continuous Infusions: ? sodium chloride 100 mL/hr at 05/26/21 1610  ? ? ? ?Objective: ?Vitals:  ? 05/25/21 1425 05/25/21 1526 05/25/21 1939 05/26/21 0401  ?BP: 126/64 126/74 112/63 116/68  ?Pulse: 72 77 82 84  ?Resp: 20 18 16 20   ?Temp: 98.4 ?F (36.9 ?C) 97.6 ?F (36.4 ?C) 98.3 ?F (36.8 ?C) (!) 97.5 ?F (36.4 ?C)  ?TempSrc: Oral Oral Oral Oral  ?SpO2: 94% 94% 95% 95%  ?Weight:      ?Height:      ? ? ?Intake/Output Summary (Last 24 hours) at 05/26/2021 1341 ?Last data filed at 05/26/2021 0853 ?Gross per 24 hour  ?Intake 2222.11 ml  ?Output --  ?Net 2222.11 ml  ? ? ?Filed Weights  ? 05/23/21 1822  ?Weight: 89.5 kg  ? ? ?Examination: ? ?  General: A/O x4, No acute respiratory distress  ?Eyes: negative scleral hemorrhage, negative anisocoria, negative icterus ?ENT: Negative Runny nose, negative gingival bleeding, ?Neck:  Negative scars, masses, torticollis, lymphadenopathy, JVD ?Lungs: Clear to auscultation bilaterally without wheezes or crackles ?Cardiovascular: Regular rate and rhythm without murmur gallop or rub normal S1 and S2 ?Abdomen: negative abdominal pain, nondistended, positive soft, bowel sounds, no rebound, no ascites, no appreciable mass ?Extremities: No significant cyanosis, clubbing, or edema bilateral lower extremities ?Skin: Negative rashes, lesions, ulcers ?Psychiatric:  Negative depression, negative anxiety, negative fatigue, negative mania  ?Central nervous system:  Cranial nerves II through XII intact, tongue/uvula midline, all extremities muscle strength 5/5, sensation intact throughout, negative dysarthria, negative expressive aphasia, negative receptive aphasia. ? ?.  ? ? ? ?Data Reviewed: Care during the described time interval was provided by me .  I have reviewed this patient's available data, including medical history, events of note, physical examination,  and all test results as part of my evaluation. ? ?CBC: ?Recent Labs  ?Lab 05/23/21 ?1416 05/24/21 ?0352 05/25/21 ?0336 05/26/21 ?0358  ?WBC 6.0 2.9* 3.7* 5.1  ?NEUTROABS 4.5  --   --   --   ?HGB 10.7* 9.8* 9.8* 9.6

## 2021-05-26 NOTE — TOC Initial Note (Signed)
Transition of Care (TOC) - Initial/Assessment Note  ? ? ?Patient Details  ?Name: Brandon Rubio. ?MRN: [id number] ?Date of Birth: 02/02/1951 ? ?Transition of Care (TOC) CM/SW Contact:    ?Tawanna Cooler, RN ?Phone Number: ?05/26/2021, 9:17 AM ? ?Clinical Narrative:                 ? ? ?Transition of Care (TOC) Screening Note ? ? ?Transition of Care Department [facility]) has reviewed patient and no TOC needs have been identified at this time. We will continue to monitor patient advancement through interdisciplinary progression rounds. If new patient transition needs arise, please place a TOC consult. ?  ? ?Expected Discharge Plan: Home/Self Care ?Barriers to Discharge: Continued Medical Work up ? ? ?Expected Discharge Plan and Services ?Expected Discharge Plan: Home/Self Care ?  ?  ?  ?Living arrangements for the past 2 months: [place] ?                ? ?Prior Living Arrangements/Services ?Living arrangements for the past 2 months: [place] ?Lives with:: Self ?Patient language and need for interpreter reviewed:: Yes ?       ?Need for Family Participation in Patient Care: Yes (Comment) ?Care giver support system in place?: Yes (comment) ?  ?Criminal Activity/Legal Involvement Pertinent to Current Situation/Hospitalization: No - Comment as needed ? ?Activities of Daily Living ?Home Assistive Devices/Equipment: None ?ADL Screening (condition at time of admission) ?Patient's cognitive ability adequate to safely complete daily activities?: Yes ?Is the patient deaf or have difficulty hearing?: No ?Does the patient have difficulty seeing, even when wearing glasses/contacts?: No ?Does the patient have difficulty concentrating, remembering, or making decisions?: No ?Patient able to express need for assistance with ADLs?: Yes ?Does the patient have difficulty dressing or bathing?: No ?Independently performs ADLs?: Yes (appropriate for developmental age) ?Does the patient have difficulty walking or  climbing stairs?: No ?Weakness of Legs: None ?Weakness of Arms/Hands: None ? ? ?Orientation: : Oriented to Self, Oriented to Place, Oriented to  Time, Oriented to Situation ?Alcohol / Substance Use: Not Applicable ?Psych Involvement: No (comment) ? ?Admission diagnosis:  Adrenal insufficiency ([place]) [E27.40] ?Weakness [R53.1] ?Patient Active Problem List  ? Diagnosis Date Noted  ? Postural dizziness with presyncope 05/24/2021  ? 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 Adrenal insufficiency ([place]) 05/23/2021  ? Anemia, unspecified 05/23/2021  ? Hypophosphatemia 05/12/2021  ? Hypokalemia/hypomagnesemia/hypophosphatemia 05/11/2021  ? Low serum cortisol level 05/11/2021  ? Hypomagnesemia 05/11/2021  ? Pancytopenia ([place]) 05/11/2021  ? Metabolic acidosis 87/86/7672  ? Orthostatic hypotension 05/10/2021  ? FTT (failure to thrive) in adult 05/10/2021  ? Goals of care, counseling/discussion 03/18/2021  ? Hyponatremia 03/07/2021  ? Dehydration 03/07/2021  ? Dyspnea on exertion 03/07/2021  ? Encounter for antineoplastic chemotherapy 01/16/2021  ? Encounter for antineoplastic immunotherapy 01/16/2021  ? Adenocarcinoma of right lung, stage 3 ([place]) 12/24/2020  ? Hemoptysis 12/11/2020  ? Rash 12/11/2020  ? Right lower lobe lung mass 12/05/2020  ? Insomnia 10/05/2020  ? Routine general medical examination at a health care facility 08/13/2016  ? Neck pain 11/26/2012  ? Physical deconditioning 08/24/2012  ? GERD (gastroesophageal reflux disease) 09/05/2010  ? Insulin dependent type 2 diabetes mellitus ([place]) 12/10/2009  ? Smokers' cough ([place]) 09/25/2008  ? SKIN CANCER, HX OF 09/25/2008  ? Essential hypertension 03/20/2008  ? CAD (coronary artery disease) 03/20/2008  ? Hyperlipidemia associated with type 2 diabetes mellitus ([place]) 12/23/2007  ? ?PCP:  [name],  MD ?Pharmacy:   ?[place] [place] [address] ?[address] ?[place] [address] ?Phone: [phone] Fax: [phone] ? ? ?Readmission Risk Interventions ?Readmission Risk Prevention Plan 05/26/2021  ?Transportation Screening Complete  ?PCP or Specialist Appt within 3-5 Days Complete  ?[place] or Home Care Consult Complete  ?Social Work Consult for [place] Planning/Counseling Complete  ?Palliative Care Screening Complete  ?Medication Review Press photographer) Complete  ?Some recent data might be hidden  ? ? ? ?

## 2021-05-27 ENCOUNTER — Ambulatory Visit
Admission: RE | Admit: 2021-05-27 | Discharge: 2021-05-27 | Disposition: A | Discharge status: Home / Self Care | Payer: Medicare Other | Source: Ambulatory Visit | Attending: Radiation Oncology | Admitting: Radiation Oncology

## 2021-05-27 ENCOUNTER — Ambulatory Visit: Payer: Medicare Other

## 2021-05-27 DIAGNOSIS — I2510 Atherosclerotic heart disease of native coronary artery without angina pectoris: Secondary | ICD-10-CM | POA: Diagnosis not present

## 2021-05-27 DIAGNOSIS — C3431 Malignant neoplasm of lower lobe, right bronchus or lung: Secondary | ICD-10-CM | POA: Diagnosis not present

## 2021-05-27 DIAGNOSIS — I10 Essential (primary) hypertension: Secondary | ICD-10-CM | POA: Diagnosis not present

## 2021-05-27 DIAGNOSIS — C3491 Malignant neoplasm of unspecified part of right bronchus or lung: Secondary | ICD-10-CM | POA: Diagnosis not present

## 2021-05-27 DIAGNOSIS — E273 Drug-induced adrenocortical insufficiency: Secondary | ICD-10-CM | POA: Diagnosis not present

## 2021-05-27 LAB — COMPREHENSIVE METABOLIC PANEL
ALT: 15 U/L (ref 0–44)
AST: 16 U/L (ref 15–41)
Albumin: 3.3 g/dL — ABNORMAL LOW (ref 3.5–5.0)
Alkaline Phosphatase: 36 U/L — ABNORMAL LOW (ref 38–126)
Anion gap: 5 (ref 5–15)
BUN: 11 mg/dL (ref 8–23)
CO2: 23 mmol/L (ref 22–32)
Calcium: 8.3 mg/dL — ABNORMAL LOW (ref 8.9–10.3)
Chloride: 111 mmol/L (ref 98–111)
Creatinine, Ser: 0.67 mg/dL (ref 0.61–1.24)
GFR, Estimated: >60 mL/min (ref >60)
Glucose, Bld: 144 mg/dL — ABNORMAL HIGH (ref 70–99)
Potassium: 3.7 mmol/L (ref 3.5–5.1)
Sodium: 139 mmol/L (ref 135–145)
Total Bilirubin: 0.4 mg/dL (ref 0.3–1.2)
Total Protein: 5.9 g/dL — ABNORMAL LOW (ref 6.5–8.1)

## 2021-05-27 LAB — CBC
HCT: 27.8 % — ABNORMAL LOW (ref 39.0–52.0)
Hemoglobin: 9.5 g/dL — ABNORMAL LOW (ref 13.0–17.0)
MCH: 33.1 pg (ref 26.0–34.0)
MCHC: 34.2 g/dL (ref 30.0–36.0)
MCV: 96.9 fL (ref 80.0–100.0)
Platelets: 186 10*3/uL (ref 150–400)
RBC: 2.87 MIL/uL — ABNORMAL LOW (ref 4.22–5.81)
RDW: 15.8 % — ABNORMAL HIGH (ref 11.5–15.5)
WBC: 5.2 10*3/uL (ref 4.0–10.5)
nRBC: 0 % (ref 0.0–0.2)

## 2021-05-27 LAB — MAGNESIUM: Magnesium: 1.7 mg/dL (ref 1.7–2.4)

## 2021-05-27 LAB — GLUCOSE, CAPILLARY
Glucose-Capillary: 105 mg/dL — ABNORMAL HIGH (ref 70–99)
Glucose-Capillary: 106 mg/dL — ABNORMAL HIGH (ref 70–99)
Glucose-Capillary: 125 mg/dL — ABNORMAL HIGH (ref 70–99)
Glucose-Capillary: 130 mg/dL — ABNORMAL HIGH (ref 70–99)

## 2021-05-27 LAB — PHOSPHORUS: Phosphorus: 3.4 mg/dL (ref 2.5–4.6)

## 2021-05-27 LAB — HEMOGLOBIN A1C
Hgb A1c MFr Bld: 6.1 % — ABNORMAL HIGH (ref 4.8–5.6)
Mean Plasma Glucose: 128.37 mg/dL

## 2021-05-27 MED ORDER — HYDROCORTISONE 5 MG PO TABS
5.0000 mg | ORAL_TABLET | Freq: Every day | ORAL | Status: DC
  Filled 2021-05-27: qty 1

## 2021-05-27 MED ORDER — HYDROCORTISONE 10 MG PO TABS: 10.0000 mg | ORAL_TABLET | Freq: Every day | ORAL | 0 refills | Status: DC

## 2021-05-27 MED ORDER — MIDODRINE HCL 10 MG PO TABS: 10.0000 mg | ORAL_TABLET | Freq: Three times a day (TID) | ORAL | 0 refills | Status: DC

## 2021-05-27 MED ORDER — HYDROCORTISONE 5 MG PO TABS
5.0000 mg | ORAL_TABLET | Freq: Every day | ORAL | 0 refills | Status: DC
Start: 2021-05-27 — End: 2021-05-30

## 2021-05-27 MED ORDER — SODIUM CHLORIDE 0.9 % IV SOLN: INTRAVENOUS | Status: DC

## 2021-05-27 NOTE — Discharge Summary (Signed)
Physician Discharge Summary  ?Brandon Rubio. IRJ:[id number] DOB: Apr 17, 1950 DOA: 05/23/2021 ? ?PCP: [name], MD ? ?Admit date: 05/23/2021 ?Discharge date: 05/27/2021 ? ?Time spent: 35 minutes ? ?Recommendations for Outpatient Follow-up:  ? ? 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 Renne Crigler Endocrinology 50 mg IV Solu-Cortef now,  ?3/17 Solu-Cortef 50 mg IV BID, then transition back to his old dosing of 15 and 10 if symptoms improved. ?-3/20 discussed case with Dr. Peyton Najjar Endocrinology who recommended that we discharge patient on Solu-Cortef 15 mg daily/10 mg qhs.  States does not need an immediate follow-up, however if there are any problems do not hesitate to contact our office for follow-up. ?  ?Fatigue/SOB/presyncope ?- 3/17 discussed at length with patient that if not secondary to adrenal insufficiency, may be secondary to side effects of chemotherapy and radiation treatment. ?- 3/17 will rule out any secondary causes. ?- 3/17 blood culture, urine culture in NGTD ?- 3/17 PCXR stable see results below ?- 3/17 orthostatic vitals ?-3/17 TSH= 0.77 ?- 3/17 continuous pulse ox ?  ?Essential HTN/Orthostatic Hypotension ?-3/17 patient orthostatic and symptomatic. ?- 3/17 normal saline 143ml/hr ?-3/17 Midodrine 10 mg TID ?-3/18 Albumin 50 g x 1 ?-3/19 patient remains orthostatic hypotensive ?-3/19 bolus normal saline 3 L@250ml /hr----> normal saline 150ml/hr ?-3/20 resolved ? ?CAD ?  ?DM type 2 insulin-dependent ?- 3/20 Hemoglobin A1c =6/1 ?-Restart home medication ? ?HLD associated with DM type II ?- 3/17 LDL= 55.  At goal ? ?Adenocarcinoma of right lung stage III ?- Per Dr. Earlie Server oncology note from 2/28,disease progression with enlarging mass and new right hilar and mediastinal lymph nodes in December 2022 ?-Current therapy as of 2/28 Concurrent chemoradiation with carboplatin for an AUC of 2 and paclitaxel 45 mg per metered squared.  First dose expected  on 04/01/2021.  Status post 4 cycles ?- Currently receiving radiation per patient appears received last dose on 3/20 ?  ?Hypomagnesmia ?- Magnesium goal> 2 ?- 3/17 Magnesium IV 3 g ?  ?Hypokalemia ?- Potassium goal> 4 ?- 3/18 K-Dur 50 mEq ? ? ? ?Discharge Diagnoses:  ?Principal Problem: ?  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  Insulin dependent type 2 diabetes mellitus ([place]) ?  Hyperlipidemia associated with type 2 diabetes mellitus ([place]) ?  Essential hypertension ?  CAD (coronary artery disease) ?  Adenocarcinoma of right lung, stage 3 (HCC) ?  Adrenal insufficiency ([place]) ?  Anemia, unspecified ?  Postural dizziness with presyncope ? ? ?Discharge Condition: Stable ? ?Diet recommendation: Carb modified ? ?Filed Weights  ? 05/23/21 1822  ?Weight: 89.5 kg  ? ? ?History of present illness:  ?Brandon Rubio. is a 71 y.o.  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  ?Presents emergency department for evaluation of weakness and fatigue.  Patient has had 2 prolonged hospitalizations for similar presentation and ultimately followed up with patient's outpatient endocrinologist Dr. Philemon Kingdom [facility] Endocrinology who recommended a slow transition down from his 15 mg a.m. and 10 mg p.m. to 10 mg and 10 mg.  He states that over the last 3 days he has noticed a noticeable change in his ability to move and states that he is so weak that he could not make his radiation appointment today.  He denies chest pain, shortness breath, abdominal pain, nausea, vomiting or other systemic symptoms ? ?Hospital Course:  ?  See above ? ?Procedures: ?3/17 PCXR: ?Image rotated to the RIGHT. Accounting for this cardiomediastinal ?contours and hilar structures are stable. ? ?Mediastinal contours likely obscure the RIGHT lower lobe mass known ?to be present and seen on  prior imaging. ? ?No lobar consolidation. No visible pneumothorax or pleural effusion. ? ?On limited assessment there is no acute skeletal process.  ? ? ? ?Consultations: ?Dr. Peyton Najjar Endocrinology ? ? ?Cultures  ?3/16 influenza A/B negative ?3/16 SARS coronavirus negative ?3/17 urine negative ?3/17 blood LEFT AC NGTD ?3/17 LEFT wrist NGTD ? ? ? ? ?Discharge Exam: ?Vitals:  ? 05/27/21 0514 05/27/21 1017 05/27/21 1021 05/27/21 1023  ?BP: 119/67 120/68 117/74 118/71  ?Pulse: 72 80 89 94  ?Resp: 20     ?Temp: 97.9 ?F (36.6 ?C)     ?TempSrc: Oral     ?SpO2: 96%     ?Weight:      ?Height:      ? ? ?General: A/O x4, No acute respiratory distress  ?Eyes: negative scleral hemorrhage, negative anisocoria, negative icterus ?ENT: Negative Runny nose, negative gingival bleeding, ?Neck:  Negative scars, masses, torticollis, lymphadenopathy, JVD ?Lungs: Clear to auscultation bilaterally without wheezes or crackles ? ?Discharge Instructions ? ? ?Allergies as of 05/27/2021   ? ?   Reactions  ? Penicillins Anaphylaxis, Swelling  ? THE THROAT SWELLS!!!! ?Because of a history of documented adverse serious drug reaction;[name] Alert bracelet  is recommended  ? Albumin (human) Hives, Itching  ? ?  ? ?  ?Medication List  ?  ? ?STOP taking these medications   ? ?hydrocortisone sodium succinate 100 MG injection ?Commonly known as: SOLU-CORTEF ?  ?ibuprofen 200 MG tablet ?Commonly known as: ADVIL ?  ?Melatonin 5 MG Chew ?  ?metoprolol tartrate 25 MG tablet ?Commonly known as: LOPRESSOR ?  ?traZODone 50 MG tablet ?Commonly known as: DESYREL ?  ? ?  ? ?TAKE these medications   ? ?aspirin EC 81 MG tablet ?Take 81 mg by mouth in the morning. Swallow whole. ?  ?Centrum Silver 50+Men Tabs ?Take 1 tablet by mouth daily with breakfast. ?  ?folic acid 1 MG tablet ?Commonly known as: FOLVITE ?Take 1 tablet (1 mg total) by mouth daily. ?  ?HYDROcodone-acetaminophen 5-325 MG tablet ?Commonly known as: Norco ?Take 1 tablet by mouth every 6  (six) hours as needed for moderate pain. ?  ?hydrocortisone 5 MG tablet ?Commonly known as: CORTEF ?Take 1 tablet (5 mg total) by mouth daily. ?What changed:  ?medication strength ?how much to take ?when to take this ?  ?hydrocortisone 10 MG tablet ?Commonly known as: Cortef ?Take 1 tablet (10 mg total) by mouth daily. ?What changed: You were already taking a medication with the same name, and this prescription was added. Make sure you understand how and when to take each. ?  ?hydrocortisone 10 MG tablet ?Commonly known as: CORTEF ?Take 1 tablet (10 mg total) by mouth at bedtime. ?What changed: You were already taking a medication with the same name, and this prescription was added. Make sure you understand how and when to take each. ?  ?metFORMIN 1000 MG tablet ?Commonly known as: GLUCOPHAGE ?TAKE 1 TABLET BY MOUTH TWICE DAILY WITH A MEAL ?  ?midodrine 10 MG tablet ?Commonly known as: PROAMATINE ?Take 1 tablet (10 mg total) by mouth 3 (three) times daily with meals. ?  ?omeprazole 20 MG capsule ?Commonly known as: PRILOSEC ?TAKE 1 CAPSULE(20 MG) BY MOUTH TWICE DAILY BEFORE A MEAL ?What changed:  ?how  much to take ?how to take this ?when to take this ?additional instructions ?  ?prochlorperazine 10 MG tablet ?Commonly known as: COMPAZINE ?Take 1 tablet (10 mg total) by mouth every 6 (six) hours as needed for nausea or vomiting. ?  ?simvastatin 40 MG tablet ?Commonly known as: ZOCOR ?Take 1 tablet (40 mg total) by mouth daily at 6 PM. ?  ? ?  ? ?Allergies  ?Allergen Reactions  ? Penicillins Anaphylaxis and Swelling  ?  THE THROAT SWELLS!!!! ?Because of a history of documented adverse serious drug reaction;[name] Alert bracelet  is recommended  ? Albumin (Human) Hives and Itching  ? ? ? ? ?The results of significant diagnostics from this hospitalization (including imaging, microbiology, ancillary and laboratory) are listed below for reference.   ? ?Significant Diagnostic Studies: ?CT Head Wo Contrast ? ?Result Date:  05/10/2021 ?CLINICAL DATA:  Headache EXAM: CT HEAD WITHOUT CONTRAST TECHNIQUE: Contiguous axial images were obtained from the base of the skull through the vertex without intravenous contrast. RADIATION DOS

## 2021-05-27 NOTE — Progress Notes (Signed)
Pt discharged to home instructions reviewed with pt; acknowledged understanding of instructions. SRP, RN ?

## 2021-05-28 ENCOUNTER — Other Ambulatory Visit: Payer: Self-pay | Admitting: Internal Medicine

## 2021-05-28 ENCOUNTER — Inpatient Hospital Stay: Payer: Medicare Other

## 2021-05-28 ENCOUNTER — Ambulatory Visit: Payer: Medicare Other

## 2021-05-28 ENCOUNTER — Encounter: Payer: Self-pay | Admitting: Radiation Oncology

## 2021-05-28 ENCOUNTER — Encounter: Payer: Self-pay | Admitting: Internal Medicine

## 2021-05-28 ENCOUNTER — Inpatient Hospital Stay: Payer: Medicare Other | Attending: Internal Medicine

## 2021-05-28 ENCOUNTER — Inpatient Hospital Stay (HOSPITAL_BASED_OUTPATIENT_CLINIC_OR_DEPARTMENT_OTHER): Payer: Medicare Other | Admitting: Internal Medicine

## 2021-05-28 ENCOUNTER — Telehealth: Payer: Medicare Other

## 2021-05-28 ENCOUNTER — Other Ambulatory Visit: Payer: Self-pay

## 2021-05-28 ENCOUNTER — Telehealth: Payer: Self-pay | Admitting: Radiation Oncology

## 2021-05-28 ENCOUNTER — Telehealth: Payer: Self-pay | Admitting: Medical Oncology

## 2021-05-28 VITALS — BP 131/85 | HR 114 | Temp 97.7°F | Resp 18 | Ht 72.0 in | Wt 199.9 lb

## 2021-05-28 DIAGNOSIS — K5900 Constipation, unspecified: Secondary | ICD-10-CM | POA: Diagnosis not present

## 2021-05-28 DIAGNOSIS — C3491 Malignant neoplasm of unspecified part of right bronchus or lung: Secondary | ICD-10-CM

## 2021-05-28 DIAGNOSIS — I252 Old myocardial infarction: Secondary | ICD-10-CM | POA: Diagnosis not present

## 2021-05-28 DIAGNOSIS — I700 Atherosclerosis of aorta: Secondary | ICD-10-CM | POA: Insufficient documentation

## 2021-05-28 DIAGNOSIS — R630 Anorexia: Secondary | ICD-10-CM | POA: Insufficient documentation

## 2021-05-28 DIAGNOSIS — Z880 Allergy status to penicillin: Secondary | ICD-10-CM | POA: Insufficient documentation

## 2021-05-28 DIAGNOSIS — C3431 Malignant neoplasm of lower lobe, right bronchus or lung: Secondary | ICD-10-CM | POA: Insufficient documentation

## 2021-05-28 DIAGNOSIS — Z86718 Personal history of other venous thrombosis and embolism: Secondary | ICD-10-CM | POA: Diagnosis not present

## 2021-05-28 DIAGNOSIS — E785 Hyperlipidemia, unspecified: Secondary | ICD-10-CM | POA: Insufficient documentation

## 2021-05-28 DIAGNOSIS — E119 Type 2 diabetes mellitus without complications: Secondary | ICD-10-CM | POA: Diagnosis not present

## 2021-05-28 DIAGNOSIS — R42 Dizziness and giddiness: Secondary | ICD-10-CM | POA: Diagnosis not present

## 2021-05-28 DIAGNOSIS — C3490 Malignant neoplasm of unspecified part of unspecified bronchus or lung: Secondary | ICD-10-CM

## 2021-05-28 DIAGNOSIS — M6281 Muscle weakness (generalized): Secondary | ICD-10-CM | POA: Insufficient documentation

## 2021-05-28 DIAGNOSIS — E2740 Unspecified adrenocortical insufficiency: Secondary | ICD-10-CM | POA: Insufficient documentation

## 2021-05-28 DIAGNOSIS — I10 Essential (primary) hypertension: Secondary | ICD-10-CM | POA: Insufficient documentation

## 2021-05-28 DIAGNOSIS — Z5111 Encounter for antineoplastic chemotherapy: Secondary | ICD-10-CM

## 2021-05-28 DIAGNOSIS — I2510 Atherosclerotic heart disease of native coronary artery without angina pectoris: Secondary | ICD-10-CM | POA: Insufficient documentation

## 2021-05-28 DIAGNOSIS — R634 Abnormal weight loss: Secondary | ICD-10-CM | POA: Diagnosis not present

## 2021-05-28 DIAGNOSIS — R5383 Other fatigue: Secondary | ICD-10-CM | POA: Diagnosis not present

## 2021-05-28 DIAGNOSIS — Z79899 Other long term (current) drug therapy: Secondary | ICD-10-CM | POA: Diagnosis not present

## 2021-05-28 DIAGNOSIS — R197 Diarrhea, unspecified: Secondary | ICD-10-CM | POA: Diagnosis not present

## 2021-05-28 DIAGNOSIS — J439 Emphysema, unspecified: Secondary | ICD-10-CM | POA: Insufficient documentation

## 2021-05-28 DIAGNOSIS — Z923 Personal history of irradiation: Secondary | ICD-10-CM | POA: Diagnosis not present

## 2021-05-28 DIAGNOSIS — Z9221 Personal history of antineoplastic chemotherapy: Secondary | ICD-10-CM | POA: Insufficient documentation

## 2021-05-28 DIAGNOSIS — R531 Weakness: Secondary | ICD-10-CM | POA: Insufficient documentation

## 2021-05-28 DIAGNOSIS — K4090 Unilateral inguinal hernia, without obstruction or gangrene, not specified as recurrent: Secondary | ICD-10-CM | POA: Diagnosis not present

## 2021-05-28 LAB — CBC WITH DIFFERENTIAL (CANCER CENTER ONLY)
Abs Immature Granulocytes: 0.03 10*3/uL (ref 0.00–0.07)
Basophils Absolute: 0.1 10*3/uL (ref 0.0–0.1)
Basophils Relative: 1 %
Eosinophils Absolute: 0.1 10*3/uL (ref 0.0–0.5)
Eosinophils Relative: 2 %
HCT: 30.4 % — ABNORMAL LOW (ref 39.0–52.0)
Hemoglobin: 10.6 g/dL — ABNORMAL LOW (ref 13.0–17.0)
Immature Granulocytes: 1 %
Lymphocytes Relative: 15 %
Lymphs Abs: 0.7 10*3/uL (ref 0.7–4.0)
MCH: 33 pg (ref 26.0–34.0)
MCHC: 34.9 g/dL (ref 30.0–36.0)
MCV: 94.7 fL (ref 80.0–100.0)
Monocytes Absolute: 0.4 10*3/uL (ref 0.1–1.0)
Monocytes Relative: 9 %
Neutro Abs: 3.4 10*3/uL (ref 1.7–7.7)
Neutrophils Relative %: 72 %
Platelet Count: 219 10*3/uL (ref 150–400)
RBC: 3.21 MIL/uL — ABNORMAL LOW (ref 4.22–5.81)
RDW: 16.2 % — ABNORMAL HIGH (ref 11.5–15.5)
WBC Count: 4.7 10*3/uL (ref 4.0–10.5)
nRBC: 0 % (ref 0.0–0.2)

## 2021-05-28 LAB — CMP (CANCER CENTER ONLY)
ALT: 14 U/L (ref 0–44)
AST: 17 U/L (ref 15–41)
Albumin: 3.8 g/dL (ref 3.5–5.0)
Alkaline Phosphatase: 42 U/L (ref 38–126)
Anion gap: 7 (ref 5–15)
BUN: 9 mg/dL (ref 8–23)
CO2: 25 mmol/L (ref 22–32)
Calcium: 9.3 mg/dL (ref 8.9–10.3)
Chloride: 108 mmol/L (ref 98–111)
Creatinine: 0.86 mg/dL (ref 0.61–1.24)
GFR, Estimated: >60 mL/min (ref >60)
Glucose, Bld: 152 mg/dL — ABNORMAL HIGH (ref 70–99)
Potassium: 3.3 mmol/L — ABNORMAL LOW (ref 3.5–5.1)
Sodium: 140 mmol/L (ref 135–145)
Total Bilirubin: 0.4 mg/dL (ref 0.3–1.2)
Total Protein: 6.5 g/dL (ref 6.5–8.1)

## 2021-05-28 NOTE — Telephone Encounter (Signed)
Sister Madaline Savage asked if  pt is a surgical candidate to remove lung cancer? ?

## 2021-05-28 NOTE — Telephone Encounter (Signed)
I have called pts sister back and advised as indicated. She expressed understanding of the information. ?

## 2021-05-28 NOTE — Progress Notes (Signed)
?    [place] ?[phone]   [phone] ? ?OFFICE PROGRESS NOTE ? ?Brandon Koch, MD ?[place] [address] ? ?DIAGNOSIS: Stage IIIA (T4, N0, M0) non-small cell lung cancer, favoring adenocarcinoma presented with large right lower lobe lung mass with no mediastinal lymphadenopathy or extrathoracic metastasis diagnosed in October 2022. Had disease progression with enlarging mass and new right hilar and mediastinal lymph nodes in December 2022.  ?  ?  ?PRIOR THERAPY: Neoadjuvant treatment with carboplatin for AUC of 5, Alimta 500 Mg/M2 and nivolumab 360 Mg IV every 3 weeks.  First dose January 23, 2021.  Status post 3 cycles. ?  ?CURRENT THERAPY: Concurrent chemoradiation with carboplatin for an AUC of 2 and paclitaxel 45 mg per metered squared.  First dose expected on 04/01/2021.  Status post 4 cycles ? ?INTERVAL HISTORY: ?Brandon Rubio. 71 y.o. male returns to the clinic today for follow-up visit accompanied by a family member.  The patient is complaining of increasing fatigue and weakness as well as dizzy spells.  He has been hospitalized several times recently because of adverse effect of the radiation therapy.  He denied having any current chest pain, shortness of breath but has cough with no hemoptysis.  He denied having any fever or chills.  He has no nausea, vomiting but has few episodes of diarrhea after taking laxative for constipation.  He had CT scan of the chest, abdomen pelvis performed during his hospitalization on May 10, 2021.  The patient is here today for evaluation and recommendation regarding his condition.  He has 2 more fraction of radiotherapy but he declined to finish the course. ? ? ?MEDICAL HISTORY: ?Past Medical History:  ?Diagnosis Date  ? CAD (coronary artery disease)   ? Cancer [facility])   ? SKIN.[name]  ? Cataract   ? Diabetes mellitus   ? 2  ? DVT (deep venous thrombosis) ([place]) 03/30/2001  ? RLE DVT, post ankle  fracture  ? GERD (gastroesophageal reflux disease)   ? Hyperlipidemia   ? Hypertension   ? Lung cancer ([place]) 12/10/2020  ? 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 Peripheral neuropathy   ? toes  ? ? ?ALLERGIES:  is allergic to penicillins and albumin (human). ? ?MEDICATIONS:  ?Current Outpatient Medications  ?Medication Sig Dispense Refill  ? aspirin EC 81 MG tablet Take 81 mg by mouth in the morning. Swallow whole.    ? folic acid (FOLVITE) 1 MG tablet Take 1 tablet (1 mg total) by mouth daily. 30 tablet 4  ? HYDROcodone-acetaminophen (NORCO) 5-325 MG tablet Take 1 tablet by mouth every 6 (six) hours as needed for moderate pain. 30 tablet 0  ? hydrocortisone (CORTEF) 10 MG tablet Take 1 tablet (10 mg total) by mouth daily. 30 tablet 0  ? hydrocortisone (CORTEF) 10 MG tablet Take 1 tablet (10 mg total) by mouth at bedtime. 30 tablet 0  ? hydrocortisone (CORTEF) 5 MG tablet Take 1 tablet (5 mg total) by mouth daily. 30 tablet 0  ? metFORMIN (GLUCOPHAGE) 1000 MG tablet TAKE 1 TABLET BY MOUTH TWICE DAILY WITH A MEAL (Patient taking differently: Take 1,000 mg by mouth 2 (two) times daily with a meal.) 180 tablet 1  ? midodrine (PROAMATINE) 10 MG tablet Take 1 tablet (10 mg total) by mouth 3 (three) times daily with meals. 90 tablet 0  ? Multiple Vitamins-Minerals (CENTRUM SILVER 50+MEN) TABS Take 1 tablet by mouth daily with breakfast.    ?  omeprazole (PRILOSEC) 20 MG capsule TAKE 1 CAPSULE(20 MG) BY MOUTH TWICE DAILY BEFORE A MEAL (Patient taking differently: Take 20 mg by mouth 2 (two) times daily before a meal.) 180 capsule 3  ? prochlorperazine (COMPAZINE) 10 MG tablet Take 1 tablet (10 mg total) by mouth every 6 (six) hours as needed for nausea or vomiting. 30 tablet 0  ? simvastatin (ZOCOR) 40 MG tablet Take 1 tablet (40 mg total) by mouth daily at 6 PM. 90 tablet 3  ? ?No current facility-administered medications for this visit.  ? ? ?SURGICAL HISTORY:  ?Past Surgical History:  ?Procedure Laterality Date  ?  BRONCHIAL BIOPSY  12/10/2020  ? Procedure: BRONCHIAL BIOPSIES;  Surgeon: Collene Gobble, MD;  Location: [facility] ENDOSCOPY;  Service: Pulmonary;;  ? BRONCHIAL BRUSHINGS  12/10/2020  ? Procedure: BRONCHIAL BRUSHINGS;  Surgeon: Collene Gobble, MD;  Location: [facility] ENDOSCOPY;  Service: Pulmonary;;  ? BRONCHIAL NEEDLE ASPIRATION BIOPSY  12/10/2020  ? Procedure: BRONCHIAL NEEDLE ASPIRATION BIOPSIES;  Surgeon: Collene Gobble, MD;  Location: [facility] ENDOSCOPY;  Service: Pulmonary;;  ? COLONOSCOPY    ? COLONOSCOPY W/ POLYPECTOMY  03/10/2009  ? LARYNGOSCOPY  03/10/1997  ? VIDEO BRONCHOSCOPY WITH ENDOBRONCHIAL NAVIGATION N/A 12/10/2020  ? Procedure: ROBOTIC VIDEO BRONCHOSCOPY WITH ENDOBRONCHIAL NAVIGATION;  Surgeon: Collene Gobble, MD;  Location: [facility] ENDOSCOPY;  Service: Pulmonary;  Laterality: N/A;  ? ? ?REVIEW OF SYSTEMS:  Constitutional: positive for anorexia, fatigue, and weight loss ?Eyes: negative ?Ears, nose, mouth, throat, and face: negative ?Respiratory: positive for cough ?Cardiovascular: negative ?Gastrointestinal: positive for diarrhea ?Genitourinary:negative ?Integument/breast: negative ?Hematologic/lymphatic: negative ?Musculoskeletal:positive for muscle weakness ?Neurological: positive for dizziness ?Behavioral/Psych: negative ?Endocrine: negative ?Allergic/Immunologic: negative  ? ?PHYSICAL EXAMINATION: General appearance: alert, cooperative, fatigued, and no distress ?Head: Normocephalic, without obvious abnormality, atraumatic ?Neck: no adenopathy, no JVD, supple, symmetrical, trachea midline, and thyroid not enlarged, symmetric, no tenderness/mass/nodules ?Lymph nodes: Cervical, supraclavicular, and axillary nodes normal. ?Resp: clear to auscultation bilaterally ?Back: symmetric, no curvature. ROM normal. No CVA tenderness. ?Cardio: regular rate and rhythm, S1, S2 normal, no murmur, click, rub or gallop ?GI: soft, non-tender; bowel sounds normal; no masses,  no organomegaly ?Extremities: extremities normal,  atraumatic, no cyanosis or edema ?Neurologic: Alert and oriented X 3, normal strength and tone. Normal symmetric reflexes. Normal coordination and gait ? ?ECOG PERFORMANCE STATUS: 1 - Symptomatic but completely ambulatory ? ?Blood pressure 131/85, pulse (!) 114, temperature 97.7 ?F (36.5 ?C), temperature source Tympanic, resp. rate 18, height 6' (1.829 m), weight 199 lb 14.4 oz (90.7 kg), SpO2 99 %. ? ?LABORATORY DATA: ?Lab Results  ?Component Value Date  ? WBC 4.7 05/28/2021  ? HGB 10.6 (L) 05/28/2021  ? HCT 30.4 (L) 05/28/2021  ? MCV 94.7 05/28/2021  ? PLT 219 05/28/2021  ? ? ?  Chemistry   ?   ?Component Value Date/Time  ? NA 139 05/27/2021 0408  ? K 3.7 05/27/2021 0408  ? CL 111 05/27/2021 0408  ? CO2 23 05/27/2021 0408  ? BUN 11 05/27/2021 0408  ? CREATININE 0.67 05/27/2021 0408  ? CREATININE 0.94 05/06/2021 1055  ?    ?Component Value Date/Time  ? CALCIUM 8.3 (L) 05/27/2021 0408  ? ALKPHOS 36 (L) 05/27/2021 0408  ? AST 16 05/27/2021 0408  ? AST 17 05/06/2021 1055  ? ALT 15 05/27/2021 0408  ? ALT 12 05/06/2021 1055  ? BILITOT 0.4 05/27/2021 0408  ? BILITOT 0.7 05/06/2021 1055  ?  ? ? ? ?RADIOGRAPHIC STUDIES: ?CT Head Wo Contrast ? ?Result  Date: 05/10/2021 ?CLINICAL DATA:  Headache EXAM: CT HEAD WITHOUT CONTRAST TECHNIQUE: Contiguous axial images were obtained from the base of the skull through the vertex without intravenous contrast. RADIATION DOSE REDUCTION: This exam was performed according to the departmental dose-optimization program which includes automated exposure control, adjustment of the mA and/or kV according to patient size and/or use of iterative reconstruction technique. COMPARISON:  MRI brain 01/01/2021 FINDINGS: Brain: No acute intracranial hemorrhage, mass effect, or herniation. No extra-axial fluid collections. No evidence of acute territorial infarct. No hydrocephalus. Moderate cortical volume loss. Mild patchy hypodensities in the periventricular and subcortical white matter, likely secondary  to chronic microvascular ischemic changes. Vascular: No hyperdense vessel or unexpected calcification. Skull: Normal. Negative for fracture or focal lesion. Sinuses/Orbits: No acute finding. Other: None. IMPRESSION: C

## 2021-05-28 NOTE — Patient Instructions (Signed)
Steps to Quit Smoking Smoking tobacco is the leading cause of preventable death. It can affect almost every organ in the body. Smoking puts you and people around you at risk for many serious, long-lasting (chronic) diseases. Quitting smoking can be hard, but it is one of the best things that you can do for your health. It is never too late to quit. How do I get ready to quit? When you decide to quit smoking, make a plan to help you succeed. Before you quit: Pick a date to quit. Set a date within the next 2 weeks to give you time to prepare. Write down the reasons why you are quitting. Keep this list in places where you will see it often. Tell your family, friends, and co-workers that you are quitting. Their support is important. Talk with your doctor about the choices that may help you quit. Find out if your health insurance will pay for these treatments. Know the people, places, things, and activities that make you want to smoke (triggers). Avoid them. What first steps can I take to quit smoking? Throw away all cigarettes at home, at work, and in your car. Throw away the things that you use when you smoke, such as ashtrays and lighters. Clean your car. Make sure to empty the ashtray. Clean your home, including curtains and carpets. What can I do to help me quit smoking? Talk with your doctor about taking medicines and seeing a counselor at the same time. You are more likely to succeed when you do both. If you are pregnant or breastfeeding, talk with your doctor about counseling or other ways to quit smoking. Do not take medicine to help you quit smoking unless your doctor tells you to do so. To quit smoking: Quit right away Quit smoking totally, instead of slowly cutting back on how much you smoke over a period of time. Go to counseling. You are more likely to quit if you go to counseling sessions regularly. Take medicine You may take medicines to help you quit. Some medicines need a  prescription, and some you can buy over-the-counter. Some medicines may contain a drug called nicotine to replace the nicotine in cigarettes. Medicines may: Help you to stop having the desire to smoke (cravings). Help to stop the problems that come when you stop smoking (withdrawal symptoms). Your doctor may ask you to use: Nicotine patches, gum, or lozenges. Nicotine inhalers or sprays. Non-nicotine medicine that is taken by mouth. Find resources Find resources and other ways to help you quit smoking and remain smoke-free after you quit. These resources are most helpful when you use them often. They include: Online chats with a counselor. Phone quitlines. Printed self-help materials. Support groups or group counseling. Text messaging programs. Mobile phone apps. Use apps on your mobile phone or tablet that can help you stick to your quit plan. There are many free apps for mobile phones and tablets as well as websites. Examples include Quit Guide from the CDC and smokefree.gov  What things can I do to make it easier to quit?  Talk to your family and friends. Ask them to support and encourage you. Call a phone quitline (1-800-QUIT-NOW), reach out to support groups, or work with a counselor. Ask people who smoke to not smoke around you. Avoid places that make you want to smoke, such as: Bars. Parties. Smoke-break areas at work. Spend time with people who do not smoke. Lower the stress in your life. Stress can make you want to   smoke. Try these things to help your stress: Getting regular exercise. Doing deep-breathing exercises. Doing yoga. Meditating. Doing a body scan. To do this, close your eyes, focus on one area of your body at a time from head to toe. Notice which parts of your body are tense. Try to relax the muscles in those areas. How will I feel when I quit smoking? Day 1 to 3 weeks Within the first 24 hours, you may start to have some problems that come from quitting tobacco.  These problems are very bad 2-3 days after you quit, but they do not often last for more than 2-3 weeks. You may get these symptoms: Mood swings. Feeling restless, nervous, angry, or annoyed. Trouble concentrating. Dizziness. Strong desire for high-sugar foods and nicotine. Weight gain. Trouble pooping (constipation). Feeling like you may vomit (nausea). Coughing or a sore throat. Changes in how the medicines that you take for other issues work in your body. Depression. Trouble sleeping (insomnia). Week 3 and afterward After the first 2-3 weeks of quitting, you may start to notice more positive results, such as: Better sense of smell and taste. Less coughing and sore throat. Slower heart rate. Lower blood pressure. Clearer skin. Better breathing. Fewer sick days. Quitting smoking can be hard. Do not give up if you fail the first time. Some people need to try a few times before they succeed. Do your best to stick to your quit plan, and talk with your doctor if you have any questions or concerns. Summary Smoking tobacco is the leading cause of preventable death. Quitting smoking can be hard, but it is one of the best things that you can do for your health. When you decide to quit smoking, make a plan to help you succeed. Quit smoking right away, not slowly over a period of time. When you start quitting, seek help from your doctor, family, or friends. This information is not intended to replace advice given to you by your health care provider. Make sure you discuss any questions you have with your health care provider. Document Revised: 11/02/2020 Document Reviewed: 05/15/2018 Elsevier Patient Education  2022 Elsevier Inc.  

## 2021-05-28 NOTE — Progress Notes (Signed)
Chemo and radiation treatment cancelled today. ?

## 2021-05-28 NOTE — Telephone Encounter (Signed)
We were notified by medical oncology that the patient wished to discontinue his treatment due to feeling well during radiation and chemotherapy. Dr. Lisbeth Renshaw is in agreement and we will cancel his two remaining sessions. ?

## 2021-05-29 ENCOUNTER — Telehealth: Payer: Self-pay

## 2021-05-29 ENCOUNTER — Ambulatory Visit: Payer: Medicare Other

## 2021-05-29 LAB — CULTURE, BLOOD (ROUTINE X 2)
Culture: NO GROWTH
Culture: NO GROWTH
Special Requests: ADEQUATE

## 2021-05-29 NOTE — Telephone Encounter (Signed)
Transition Care Management Follow-up Telephone Call ?Date of discharge and from where: 06/06/2021 from [facility] ?Diagnosis: Adrenal Insuffiency ?How have you been since you were released from the hospital? "Feeling pretty" ?Any questions or concerns? No ? ?Items Reviewed: ?Did the pt receive and understand the discharge instructions provided? Yes  ?Medications obtained and verified? Yes  ?Other? No  ?Any new allergies since your discharge? No  ?Dietary orders reviewed? No ?Do you have support at home? Yes  (sister lives near by) ? ?Home Care and Equipment/Supplies: ?Were home health services ordered? no ?If so, what is the name of the agency? none  ?Has the agency set up a time to come to the patient's home? not applicable ?Were any new equipment or medical supplies ordered?  No ?What is the name of the medical supply agency? none ?Were you able to get the supplies/equipment? not applicable ?Do you have any questions related to the use of the equipment or supplies? No ? ?Functional Questionnaire: (I = Independent and D = Dependent) ?ADLs: I ? ?Bathing/Dressing- I ? ?Meal Prep- I ? ?Eating- I ? ?Maintaining continence- I ? ?Transferring/Ambulation- I ? ?Managing Meds- I ? ?Follow up appointments reviewed: ? ?PCP Hospital f/u appt confirmed? Yes  Scheduled to see Pricilla Holm, MD on 05/31/2021 @ 2:40 pm. ?[facility] f/u appt confirmed? Yes  Scheduled to see [facility] on 05/30/2021 @ 10:00am. ?Are transportation arrangements needed? No  ?If their condition worsens, is the pt aware to call PCP or go to the Emergency Dept.? Yes ?Was the patient provided with contact information for the PCP's office or ED? Yes ?Was to pt encouraged to call back with questions or concerns? Yes ? ?

## 2021-05-30 ENCOUNTER — Other Ambulatory Visit: Payer: Self-pay

## 2021-05-30 ENCOUNTER — Ambulatory Visit: Payer: Medicare Other

## 2021-05-30 ENCOUNTER — Encounter: Payer: Self-pay | Admitting: Internal Medicine

## 2021-05-30 ENCOUNTER — Ambulatory Visit (INDEPENDENT_AMBULATORY_CARE_PROVIDER_SITE_OTHER): Payer: Medicare Other | Admitting: Internal Medicine

## 2021-05-30 VITALS — BP 120/68 | HR 114 | Ht 72.0 in | Wt 200.0 lb

## 2021-05-30 DIAGNOSIS — E2749 Other adrenocortical insufficiency: Secondary | ICD-10-CM

## 2021-05-30 DIAGNOSIS — I2510 Atherosclerotic heart disease of native coronary artery without angina pectoris: Secondary | ICD-10-CM

## 2021-05-30 MED ORDER — HYDROCORTISONE 10 MG PO TABS: 10.0000 mg | ORAL_TABLET | Freq: Every day | ORAL | 3 refills | Status: DC

## 2021-05-30 MED ORDER — HYDROCORTISONE 5 MG PO TABS: 15.0000 mg | ORAL_TABLET | Freq: Every day | ORAL | 3 refills | Status: DC

## 2021-05-30 NOTE — Progress Notes (Signed)
Patient ID: Brandon Hyle., male   DOB: 1950/10/21, 71 y.o.   MRN: [id number] ? ?This visit occurred during the SARS-CoV-2 public health emergency.  Safety protocols were in place, including screening questions prior to the visit, additional usage of staff PPE, and extensive cleaning of exam room while observing appropriate contact time as indicated for disinfecting solutions.  ? ?HPI  ?Brandon Halteman. is a 71 y.o.-year-old male, initially referred by his PCP, Dr. [name], returning for management of adrenal insufficiency.  Last visit 9 days ago. ? ?Interim history: ?Patient was recently admitted, 3 days after I last saw him with significant weakness.  B12, folate, TSH were normal, blood and urine cultures were negative.  He did have a low magnesium, however, otherwise, no other pathology was found. ?At that time I discussed with the ED physician and recommended IV steroids while in-house and to be discharged on his previous hydrocortisone dose of 15 mg in a.m. and 10 mg in p.m. I also discussed with Dr. Sherral Hammers, the admitting hospitalist, about this plan.  He mentions that this episode of weakness happened after radiation therapy. ?He is now feeling slightly better. He does have back pain. SOB improved. ?He had 31 of 33 radiation treatments >> stopped now as he was not able to tolerate the latest treatments. ? ?Reviewed history: ?Patient has a history of lung cancer stage III diagnosed 11/2020.  He was started on ChTx and Immuno Tx.  After 2 ChTx, he got very sick >> admitted. He then tried ChTx (but platelets decreased to low) and RxTx >> very sick >> admitted.  Of note, he got dexamethasone with his treatments.  He is now only on RxTx. ? ?During both admissions dizziness and orthostatic  hypotension and was found to have a low cortisol, which did not stimulate well with cosyntropin: ?Component ?    Latest Ref Rng & Units 05/11/2021 05/12/2021  ?Cortisol, Base ?    ug/dL  0.7  ?Cortisol, 30 Min ?    ug/dL   8.0  ?Cortisol, 60 Min ?    ug/dL  11.6  ?Cortisol, Plasma ?    ug/dL 1.1   ?C206 ACTH ?    7.2 - 63.3 pg/mL  <1.5 (L)  ? ?MRI (05/15/2021); no pituitary mass ? ?An adrenal insufficiency diagnosis was made and he was started on hydrocortisone: ?- 15 mg in am (7-8 am) ?- 10 mg in pm (6-7 pm) ? ?At last visit I advised him to decrease the dose to: ?- 10 mg in am ?- 10 mg in pm ?She was taking these doses before last admission. ? ?He is also on Midodrine. ? ?No h/o po ketoconazole, phenytoin, rifampin, chronic fluconazole use. ?No h/o autoimmune diseases in pt or family mbs. ?No excess use of NSAIDs. ?No h/o generalized infections or HIV. ?No IVDA. ?No h/o head injury or severe HA. ? ?At last visit, he mentioned: ?- + weight loss - lost 20 lbs in last 1.5 mo ?- + fatigue ?- + nausea ?- no vomiting ?- + abdominal pain ?- + mm aches (legs and back) ?- no palpitations ?- + mild HAs ?- + dizziness ?- + presyncopal episodes (when taking a shower) ?- + SOB with minimal exertion >> this improved ? ?He had slight hypokalemia lately, but no hyponatremia: ?  Chemistry   ?   ?Component Value Date/Time  ? NA 140 05/28/2021 0855  ? K 3.3 (L) 05/28/2021 0855  ? CL 108 05/28/2021 0855  ? CO2 25  05/28/2021 0855  ? BUN 9 05/28/2021 0855  ? CREATININE 0.86 05/28/2021 0855  ?    ?Component Value Date/Time  ? CALCIUM 9.3 05/28/2021 0855  ? ALKPHOS 42 05/28/2021 0855  ? AST 17 05/28/2021 0855  ? ALT 14 05/28/2021 0855  ? BILITOT 0.4 05/28/2021 0855  ?  ? ?Pt. also has a history of DM2 with recent history of DKA.  However, this is more controlled lately: ?Lab Results  ?Component Value Date  ? HGBA1C 6.1 (H) 05/27/2021  ? HGBA1C 5.9 (H) 05/11/2021  ? HGBA1C 9.2 (H) 03/07/2021  ? ?He is on: ?- Metformin 500 mg 2x a day ?- Lantus 15 units daily >> stopped due to good control ?- Jardiance 25 mg daily >> stopped due to good control ? ?He has a history of hyperlipidemia for which she is on Zocor 40 mg daily. ? ?He also has a history of  pancytopenia. ? ?He had COVID-19 in 02/2021. ? ?ROS: ?Constitutional: + See HPI ? ?Past Medical History:  ?Diagnosis Date  ? CAD (coronary artery disease)   ? Cancer [facility])   ? SKIN.[name]  ? Cataract   ? Diabetes mellitus   ? 2  ? DVT (deep venous thrombosis) ([place]) 03/30/2001  ? RLE DVT, post ankle fracture  ? GERD (gastroesophageal reflux disease)   ? Hyperlipidemia   ? Hypertension   ? Lung cancer ([place]) 12/10/2020  ? 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 Peripheral neuropathy   ? toes  ? ?Past Surgical History:  ?Procedure Laterality Date  ? BRONCHIAL BIOPSY  12/10/2020  ? Procedure: BRONCHIAL BIOPSIES;  Surgeon: Collene Gobble, MD;  Location: [facility] ENDOSCOPY;  Service: Pulmonary;;  ? BRONCHIAL BRUSHINGS  12/10/2020  ? Procedure: BRONCHIAL BRUSHINGS;  Surgeon: Collene Gobble, MD;  Location[facility] ENDOSCOPY;  Service: Pulmonary;;  ? BRONCHIAL NEEDLE ASPIRATION BIOPSY  12/10/2020  ? Procedure: BRONCHIAL NEEDLE ASPIRATION BIOPSIES;  Surgeon: Collene Gobble, MD;  Location: [facility] ENDOSCOPY;  Service: Pulmonary;;  ? COLONOSCOPY    ? COLONOSCOPY W/ POLYPECTOMY  03/10/2009  ? LARYNGOSCOPY  03/10/1997  ? VIDEO BRONCHOSCOPY WITH ENDOBRONCHIAL NAVIGATION N/A 12/10/2020  ? Procedure: ROBOTIC VIDEO BRONCHOSCOPY WITH ENDOBRONCHIAL NAVIGATION;  Surgeon: Collene Gobble, MD;  Location: [facility] ENDOSCOPY;  Service: Pulmonary;  Laterality: N/A;  ? ?Social History  ? ?Socioeconomic History  ? Marital status: Divorced  ?  Spouse name: Not on file  ? Number of children: 0  ? Years of education: Not on file  ? Highest education level: Not on file  ?Occupational History  ? Occupation: Retired Clinical biochemist  ?Tobacco Use  ? Smoking status: Every Day  ?  Packs/day: 0.25  ?  Years: 58.00  ?  Pack years: 14.50  ?  Types: Cigarettes  ? Smokeless tobacco: Never  ? Tobacco comments:  ?  2+ packs smoked daily [facility] 12/05/20  ?Vaping Use  ? Vaping Use: Never used  ?Substance and Sexual Activity  ? Alcohol use: No  ?  Alcohol/week: 0.0 standard drinks  ?   Comment:  none since 04/2012  ? Drug use: Never  ? Sexual activity: Not on file  ?Other Topics Concern  ? Not on file  ?Social History Narrative  ? REG EXERCISE  ?   ?   ? ?Social Determinants of Health  ? ?Financial Resource Strain: Not on file  ?Food Insecurity: Not on file  ?Transportation Needs: Not on file  ?Physical Activity: Not on file  ?Stress: Not on file  ?Social Connections:  Not on file  ?Intimate Partner Violence: Not At Risk  ? Fear of Current or Ex-Partner: No  ? Emotionally Abused: No  ? Physically Abused: No  ? Sexually Abused: No  ? ?Current Outpatient Medications on File Prior to Visit  ?Medication Sig Dispense Refill  ? aspirin EC 81 MG tablet Take 81 mg by mouth in the morning. Swallow whole.    ? bisacodyl (DULCOLAX) 5 MG EC tablet Take 5 mg by mouth daily as needed for moderate constipation.    ? folic acid (FOLVITE) 1 MG tablet Take 1 tablet (1 mg total) by mouth daily. 30 tablet 4  ? HYDROcodone-acetaminophen (NORCO) 5-325 MG tablet Take 1 tablet by mouth every 6 (six) hours as needed for moderate pain. 30 tablet 0  ? hydrocortisone (CORTEF) 10 MG tablet Take 1 tablet (10 mg total) by mouth daily. 30 tablet 0  ? hydrocortisone (CORTEF) 10 MG tablet Take 1 tablet (10 mg total) by mouth at bedtime. 30 tablet 0  ? hydrocortisone (CORTEF) 5 MG tablet Take 1 tablet (5 mg total) by mouth daily. 30 tablet 0  ? metFORMIN (GLUCOPHAGE) 1000 MG tablet TAKE 1 TABLET BY MOUTH TWICE DAILY WITH A MEAL (Patient taking differently: Take 1,000 mg by mouth 2 (two) times daily with a meal.) 180 tablet 1  ? midodrine (PROAMATINE) 10 MG tablet Take 1 tablet (10 mg total) by mouth 3 (three) times daily with meals. 90 tablet 0  ? Multiple Vitamins-Minerals (CENTRUM SILVER 50+MEN) TABS Take 1 tablet by mouth daily with breakfast.    ? omeprazole (PRILOSEC) 20 MG capsule TAKE 1 CAPSULE(20 MG) BY MOUTH TWICE DAILY BEFORE A MEAL (Patient taking differently: Take 20 mg by mouth 2 (two) times daily before a meal.) 180  capsule 3  ? prochlorperazine (COMPAZINE) 10 MG tablet Take 1 tablet (10 mg total) by mouth every 6 (six) hours as needed for nausea or vomiting. 30 tablet 0  ? simvastatin (ZOCOR) 40 MG tablet Take 1 tablet (40 mg t

## 2021-05-30 NOTE — Patient Instructions (Signed)
Please continue hydrocortisone: ?- 15 mg in am and 10 mg in pm ? ?Take the first dose when you wake up in the second dose around 2 to 3 PM, but no later than 6 PM. ? ?You absolutely need to take this medication every day and not skip doses. ? ?Please double the dose if you have a fever, for the duration of the fever.  Also, if you have significant other stress like dehydration, diarrhea, or otherwise unexplained weakness. ? ?If you cannot take anything by mouth (vomiting) or you have severe diarrhea so that you eliminate the hydrocortisone pills in your stool, please make sure that you get the hydrocortisone in the vein instead - go to the nearest emergency department/urgent care or you may go to your PCPs office. ? ?Please try to get a MedAlert bracelet or pendant indicating: "Adrenal insufficiency".  ? ?Please come back for a follow-up appointment in 3 months. ?

## 2021-05-31 ENCOUNTER — Ambulatory Visit (INDEPENDENT_AMBULATORY_CARE_PROVIDER_SITE_OTHER): Payer: Medicare Other | Admitting: Internal Medicine

## 2021-05-31 ENCOUNTER — Encounter: Payer: Self-pay | Admitting: Internal Medicine

## 2021-05-31 DIAGNOSIS — R0609 Other forms of dyspnea: Secondary | ICD-10-CM

## 2021-05-31 DIAGNOSIS — E273 Drug-induced adrenocortical insufficiency: Secondary | ICD-10-CM | POA: Diagnosis not present

## 2021-05-31 DIAGNOSIS — T380X5A Adverse effect of glucocorticoids and synthetic analogues, initial encounter: Secondary | ICD-10-CM

## 2021-05-31 DIAGNOSIS — I2510 Atherosclerotic heart disease of native coronary artery without angina pectoris: Secondary | ICD-10-CM | POA: Diagnosis not present

## 2021-05-31 DIAGNOSIS — C3491 Malignant neoplasm of unspecified part of right bronchus or lung: Secondary | ICD-10-CM

## 2021-05-31 DIAGNOSIS — E118 Type 2 diabetes mellitus with unspecified complications: Secondary | ICD-10-CM

## 2021-05-31 MED ORDER — HYDROCODONE-ACETAMINOPHEN 5-325 MG PO TABS: 1.0000 | ORAL_TABLET | Freq: Four times a day (QID) | ORAL | 0 refills | Status: DC | PRN

## 2021-05-31 NOTE — Assessment & Plan Note (Signed)
Taking hydrocortisone 15 mg qam and 10 mg qpm and will maintain that regimen. Depending on oncology course and repeated steroids may be able to attempt gradual taper once compete. Seeing endocrinology and they are managing his dosing and timeline to consider gradual taper of steroids. He understands that he needs to take regularly and if unable to eat/drink to contact doctor.  ?

## 2021-05-31 NOTE — Assessment & Plan Note (Signed)
Taking metformin alone currently. When he was on high dose steroids for chemotherapy he did require insulin briefly but is off this. Continue metformin 1000 mg BID as his most recent control is good. Some neuropathy which is stable.  ?

## 2021-05-31 NOTE — Progress Notes (Signed)
? ?  Subjective:  ? ?Patient ID: Brandon Rubio., male    DOB: Oct 26, 1950, 71 y.o.   MRN: [id number] ? ?HPI ?The patient is a 71 YO man coming in for hospital follow up (admitted several times for dehydration and weakness since our last visit due to new adrenal insufficiency and radiation side effects). He is taking hydrocortisone 15 mg and 10 mg daily per endo recommendations.  ? ?Review of Systems  ?Constitutional:  Positive for activity change, appetite change and fatigue.  ?HENT: Negative.    ?Eyes: Negative.   ?Respiratory:  Positive for shortness of breath. Negative for cough and chest tightness.   ?     Improved overall  ?Cardiovascular:  Negative for chest pain, palpitations and leg swelling.  ?Gastrointestinal:  Negative for abdominal distention, abdominal pain, constipation, diarrhea, nausea and vomiting.  ?Musculoskeletal:  Positive for back pain.  ?Skin: Negative.   ?Neurological: Negative.   ?Psychiatric/Behavioral: Negative.    ? ?Objective:  ?Physical Exam ?Constitutional:   ?   Appearance: He is well-developed.  ?HENT:  ?   Head: Normocephalic and atraumatic.  ?Cardiovascular:  ?   Rate and Rhythm: Normal rate and regular rhythm.  ?Pulmonary:  ?   Effort: Pulmonary effort is normal. No respiratory distress.  ?   Breath sounds: Normal breath sounds. No wheezing or rales.  ?Abdominal:  ?   General: Bowel sounds are normal. There is no distension.  ?   Palpations: Abdomen is soft.  ?   Tenderness: There is no abdominal tenderness. There is no rebound.  ?Musculoskeletal:     ?   General: Tenderness present.  ?   Cervical back: Normal range of motion.  ?Skin: ?   General: Skin is warm and dry.  ?Neurological:  ?   Mental Status: He is alert and oriented to person, place, and time.  ?   Coordination: Coordination normal.  ? ? ?Vitals:  ? 05/31/21 1434  ?BP: 118/70  ?Pulse: (!) 102  ?Temp: 98.7 ?F (37.1 ?C)  ?TempSrc: Oral  ?SpO2: 90%  ?Weight: 200 lb (90.7 kg)  ?Height: 6' (1.829 m)  ? ? ?This visit  occurred during the SARS-CoV-2 public health emergency.  Safety protocols were in place, including screening questions prior to the visit, additional usage of staff PPE, and extensive cleaning of exam room while observing appropriate contact time as indicated for disinfecting solutions.  ? ?Assessment & Plan:  ? ?

## 2021-05-31 NOTE — Assessment & Plan Note (Signed)
Overall is improving slightly and may have been related to his cancer and radiation pneumonitis. Will continue to monitor and recent CT chest is reviewed and encouraging.  ?

## 2021-05-31 NOTE — Assessment & Plan Note (Signed)
Rx hydrocodone for cancer related back pain. 5/325 every 6 hours as needed for #60 no refills. Reviewed [place] narcotic database.  ?

## 2021-05-31 NOTE — Patient Instructions (Signed)
We have refilled the hydrocodone for pain.  ?

## 2021-06-04 NOTE — Progress Notes (Signed)
?  Radiation Oncology         [phone] ?________________________________ ? ?Name: Brandon Rubio. MRN: [id number]  ?Date: 05/28/2021  DOB: 21-Dec-1950 ? ?End of Treatment Note ? ?Diagnosis:   Progressive Stage III, cT4N1,M0, NSCLC, adenocarcinoma of the RLL    ? ?Indication for treatment: curative ? ?Radiation treatment dates:   04/01/21-05/27/21 ? ?Site/planned dose:   The RLL target and regional nodes were treated to 60 Gy in 30 fractions, and 1 of the 3 fractions of his boost treatment, for 2 Gy per fraction. Total received was 62 Gy of the 66 Gy prescription. ? ?Narrative: The patient's status declined during his treatment and was hospitalized due to orthostatic hypotension, presyncope, and adrenal insufficiency. He declined multiple treatments due to feeling poorly despite encouragement to resume. Ultimately he decided he did not wish to complete the two remaining fractions.  ? ?Plan: The patient will receive a call in about one month from the radiation oncology department. He will continue follow up with Dr. Julien Nordmann as well.  ? ? ? ? ?Carola Rhine, PAC  ?

## 2021-06-10 ENCOUNTER — Telehealth: Payer: Self-pay

## 2021-06-10 NOTE — Progress Notes (Signed)
? ? ?Chronic Care Management ?Pharmacy Assistant  ? ?Name: Brandon Rubio.  MRN: [id number] DOB: 05/16/1950 ? ?Brandon Rubio. is an 71 y.o. year old male who was called today for his follow-up assessment call. ? ?Reason for Encounter: Disease State-General ?  ? ?Recent office visits:  ?05/31/21 [name], MD-PCP (Adenocarcinoma of right lung) No orders or med changes ? ?05/30/21 Philemon Kingdom, MD-Internal Medicine (Adrenal insufficiency) Med changes: continue hydrocortisone: 15 mg in am and 10 mg in pm ? ?05/21/21 Philemon Kingdom, MD-Internal Medicine (Adrenal insufficiency) Med changes: continue hydrocortisone: 10 mg in am and 10 mg in pm for the next month, then 10 mg in am and 5 mg in pm ? ?Recent consult visits:  ?05/28/21 Curt Bears, MD-Oncology (Malignant neoplasm of unspecified part of unspecified bronchus or lung) ?Orders placed: CBC, CMP, CT chest, No med changes ? ?05/07/21 Curt Bears, MD-Oncology (Dehydration) No orders or med changes ? ?05/02/21 Heilingoetter, [name], PA-C (Lightheadedness) Ordered Glucose,capillary, no med changes ? ?04/29/21, 04/15/21 Curt Bears, MD-Oncology ((Adenocarcinoma of right lung) No orders or med changes ? ?Hospital visits:  ?Medication Reconciliation was completed by comparing discharge summary, patient?s EMR and Pharmacy list, and upon discussion with patient. ? ?Admitted to the hospital on 05/23/21 due to adrenal insufficiency due to corticosteroid withdrawal. Discharge date was 05/27/21. Discharged from [facility].   ?Medication Changes at Hospital Discharge: ?-Changed hydrocortisone 5 mg ? ?Medications Discontinued at Hospital Discharge: ?-Stopped   ?Ibuprofen, melatonin, trazadone ? ?Admitted to the hospital on 05/10/21 due to Orthostatic hypotension. Discharge date was 05/16/21. Discharged from [facility].   ?Start:  ?Hydrocortisone ? ?Stop: ?insulin glargine 100 UNIT/ML Solostar Pen  (LANTUS) ?metoprolol succinate 50 MG 24 hr tablet (TOPROL-XL) ?nicotine 21 mg/24hr patch (NICODERM CQ - dosed in mg/24 hours) ?triamcinolone cream 0.1 % (KENALOG) ? ?Medications that remain the same after Hospital Discharge:??  ?-All other medications will remain the same.   ? ?Admitted to the hospital on 05/06/21 due to weakness, dizziness,and anorexia. Discharge date was 05/06/21. Discharged from [facility] ED.  ? ? Medications that remain the same after Hospital Discharge:??  ?-All other medications will remain the same.   ?Medications: ?Outpatient Encounter Medications as of 06/10/2021  ?Medication Sig  ? aspirin EC 81 MG tablet Take 81 mg by mouth in the morning. Swallow whole.  ? bisacodyl (DULCOLAX) 5 MG EC tablet Take 5 mg by mouth daily as needed for moderate constipation.  ? folic acid (FOLVITE) 1 MG tablet Take 1 tablet (1 mg total) by mouth daily.  ? HYDROcodone-acetaminophen (NORCO) 5-325 MG tablet Take 1 tablet by mouth every 6 (six) hours as needed for moderate pain.  ? hydrocortisone (CORTEF) 10 MG tablet Take 1 tablet (10 mg total) by mouth at bedtime.  ? hydrocortisone (CORTEF) 5 MG tablet Take 3 tablets (15 mg total) by mouth daily.  ? metFORMIN (GLUCOPHAGE) 1000 MG tablet TAKE 1 TABLET BY MOUTH TWICE DAILY WITH A MEAL (Patient taking differently: Take 1,000 mg by mouth 2 (two) times daily with a meal.)  ? midodrine (PROAMATINE) 10 MG tablet Take 1 tablet (10 mg total) by mouth 3 (three) times daily with meals.  ? Multiple Vitamins-Minerals (CENTRUM SILVER 50+MEN) TABS Take 1 tablet by mouth daily with breakfast.  ? omeprazole (PRILOSEC) 20 MG capsule TAKE 1 CAPSULE(20 MG) BY MOUTH TWICE DAILY BEFORE A MEAL (Patient taking differently: Take 20 mg by mouth 2 (two) times daily before a meal.)  ?  prochlorperazine (COMPAZINE) 10 MG tablet Take 1 tablet (10 mg total) by mouth every 6 (six) hours as needed for nausea or vomiting.  ? simvastatin (ZOCOR) 40 MG tablet Take 1 tablet (40 mg  total) by mouth daily at 6 PM.  ? ?No facility-administered encounter medications on file as of 06/10/2021.  ? ?Have you had any problems recently with your health?Patient states that he is having a lot of back pain since he saw Dr. [name], she prescribed hydrocodone which he states does not help much. He also states that he does not have any stamina to do anything. He said that just going to the grocery store wears him out. ? ?Have you had any problems with your pharmacy?Patient states that he does not have any problems with getting medications from the pharmacy. States that he just picked up his hyrdocortisone which was $88 ? ?What issues or side effects are you having with your medications?Patient states that the  Hydrocortisone is making him sick ? ?What would you like me to pass along to [facility] for them to help you with? Patient states that he would like to know what he can do to make him feel better when he takes the hydrocortisone ? ?What can we do to take care of you better? Patient states that he does not need anything at this time ? ?Care Gaps: ?Colonoscopy-12/31/20 ?Diabetic Foot Exam-06/14/20 ?Ophthalmology-06/09/20 ?Dexa Scan - NA ?Annual Well Visit - NA ?Micro albumin-06/14/20 ?Hemoglobin A1c- 05/27/21 ? ?Star Rating Drugs: ?Simvastatin 40 mg-last fill 12/23/20 90 ds ?Metformin 1000 mg-last fill 12/24/20 90 ds ? ?Ethelene Hal ?Clinical Pharmacist Assistant ?[phone]  ?

## 2021-06-11 ENCOUNTER — Telehealth: Payer: Self-pay | Admitting: Internal Medicine

## 2021-06-11 NOTE — Telephone Encounter (Signed)
Scheduled per 03/21 los, patient has been called and notified. 

## 2021-06-18 ENCOUNTER — Telehealth: Payer: Self-pay

## 2021-06-18 NOTE — Telephone Encounter (Signed)
Patient called complaining that he doesn't like how the hydrocortisone makes him feel.  Please advise. ?

## 2021-06-19 NOTE — Telephone Encounter (Signed)
Brandon Rubio, ?Can you please give me more information about this? ?Ty! ?CG ?

## 2021-06-20 ENCOUNTER — Other Ambulatory Visit: Payer: Self-pay | Admitting: Internal Medicine

## 2021-06-20 MED ORDER — PREDNISONE 5 MG PO TABS: 7.5000 mg | ORAL_TABLET | Freq: Every day | ORAL | 5 refills | Status: DC

## 2021-06-20 NOTE — Telephone Encounter (Signed)
OK, I sent the prescription for prednisone for him.  For now, I would advise him to take 1.5 tablets in the morning right before breakfast.  In time, I am hoping that we can decrease the dose to only 1 tablet daily.  If he tolerates it well, I can send more for him. ?

## 2021-06-20 NOTE — Telephone Encounter (Signed)
Patient says when he takes the hydrocortisone he can taste it, causes upset stomach and he feels like he is sweating it out of his pores.  He is taking 3, 5mg  during the day and 10mg  at night.  He is also doing radiation, started about 2 weeks ago.   ?

## 2021-06-24 ENCOUNTER — Ambulatory Visit
Admission: RE | Admit: 2021-06-24 | Discharge: 2021-06-24 | Disposition: A | Discharge status: Home / Self Care | Payer: Medicare Other | Source: Ambulatory Visit | Attending: Radiation Oncology | Admitting: Radiation Oncology

## 2021-06-24 DIAGNOSIS — C3491 Malignant neoplasm of unspecified part of right bronchus or lung: Secondary | ICD-10-CM | POA: Insufficient documentation

## 2021-06-24 NOTE — Progress Notes (Signed)
?  Radiation Oncology         [phone] ?________________________________ ? ?Name: Brandon Rubio. MRN: [id number]  ?Date of Service: 06/24/2021  DOB: December 27, 1950 ? ?Post Treatment Telephone Note ? ?Diagnosis:   Progressive Stage III, cT4N1,M0, NSCLC, adenocarcinoma of the RLL    ?  ?Indication for treatment: curative ?  ?Radiation treatment dates:   04/01/21-05/27/21 ?  ?Site/planned dose:   The RLL target and regional nodes were treated to 60 Gy in 30 fractions, and 1 of the 3 fractions of his boost treatment, for 2 Gy per fraction. Total received was 62 Gy of the 66 Gy prescription. ?  ?Narrative: The patient's status declined during his treatment and was hospitalized due to orthostatic hypotension, presyncope, and adrenal insufficiency. He declined multiple treatments due to feeling poorly despite encouragement to resume. Ultimately he decided he did not wish to complete the two remaining fractions. He's still having difficulty with nausea and is going to change steroids with Dr. Letta Median. He reports he's had more weight loss, but is starting to take in some soft foods.  ? ?Impression/Plan: ?1.  Progressive Stage III, cT4N1,M0, NSCLC, adenocarcinoma of the RLL. The patient has been doing well since completion of radiotherapy. We discussed that we would be happy to continue to follow him as needed, but he will also continue to follow up with Dr. Julien Nordmann in medical oncology.  ?2. Adrenal Insufficiency and DM2. He will follow up with Dr. Letta Median in endocrinology.  ? ? ? ? ?Carola Rhine, PAC  ? ? ? ? ?

## 2021-06-24 NOTE — Telephone Encounter (Signed)
Informed patient that prednisone was sent to pharmacy.  He will call back for an appointment if this does not help his symptoms. ?

## 2021-06-26 ENCOUNTER — Telehealth: Payer: Medicare Other

## 2021-06-26 ENCOUNTER — Other Ambulatory Visit: Payer: Self-pay | Admitting: Internal Medicine

## 2021-06-26 ENCOUNTER — Ambulatory Visit (INDEPENDENT_AMBULATORY_CARE_PROVIDER_SITE_OTHER): Payer: Medicare Other

## 2021-06-26 DIAGNOSIS — Z0000 Encounter for general adult medical examination without abnormal findings: Secondary | ICD-10-CM

## 2021-06-26 NOTE — Progress Notes (Addendum)
?I connected with Brandon Rubio. today by telephone and verified that I am speaking with the correct person using two identifiers. ?Location patient: home ?Location provider: work ?Persons participating in the virtual visit: patient, provider. ?  ?I discussed the limitations, risks, security and privacy concerns of performing an evaluation and management service by telephone and the availability of in person appointments. I also discussed with the patient that there may be a patient responsible charge related to this service. The patient expressed understanding and verbally consented to this telephonic visit.  ?  ?Interactive audio and video telecommunications were attempted between this provider and patient, however failed, due to patient having technical difficulties OR patient did not have access to video capability.  We continued and completed visit with audio only. ? ?Some vital signs may be absent or patient reported.  ? ?Time Spent with patient on telephone encounter: 30 minutes ? ?Subjective:  ? Brandon Rubio. is a 71 y.o. male who presents for Medicare Annual/Subsequent preventive examination. ? ?Review of Systems    ? ?Cardiac Risk Factors include: advanced age (>11men, >86 women);diabetes mellitus;dyslipidemia;family history of premature cardiovascular disease;hypertension;male gender ? ?   ?Objective:  ?  ?There were no vitals filed for this visit. ?There is no height or weight on file to calculate BMI. ? ? ?  06/26/2021  ? 11:48 AM 05/28/2021  ?  9:25 AM 05/23/2021  ?  1:54 PM 05/10/2021  ? 10:10 AM 05/06/2021  ? 12:23 PM 04/29/2021  ?  9:15 AM 03/26/2021  ?  8:41 AM  ?Advanced Directives  ?Does Patient Have a Medical Advance Directive? No No No No No No No  ?Does patient want to make changes to medical advance directive?       No - Patient declined  ?Would patient like information on creating a medical advance directive? No - Patient declined No - Patient declined No - Patient declined No -  Patient declined No - Patient declined No - Patient declined No - Patient declined  ? ? ?Current Medications (verified) ?Outpatient Encounter Medications as of 06/26/2021  ?Medication Sig  ? aspirin EC 81 MG tablet Take 81 mg by mouth in the morning. Swallow whole.  ? bisacodyl (DULCOLAX) 5 MG EC tablet Take 5 mg by mouth daily as needed for moderate constipation.  ? folic acid (FOLVITE) 1 MG tablet Take 1 tablet (1 mg total) by mouth daily.  ? HYDROcodone-acetaminophen (NORCO) 5-325 MG tablet Take 1 tablet by mouth every 6 (six) hours as needed for moderate pain.  ? metFORMIN (GLUCOPHAGE) 1000 MG tablet TAKE 1 TABLET BY MOUTH TWICE DAILY WITH A MEAL (Patient taking differently: Take 1,000 mg by mouth 2 (two) times daily with a meal.)  ? midodrine (PROAMATINE) 10 MG tablet Take 1 tablet (10 mg total) by mouth 3 (three) times daily with meals.  ? Multiple Vitamins-Minerals (CENTRUM SILVER 50+MEN) TABS Take 1 tablet by mouth daily with breakfast.  ? omeprazole (PRILOSEC) 20 MG capsule TAKE 1 CAPSULE(20 MG) BY MOUTH TWICE DAILY BEFORE A MEAL (Patient taking differently: Take 20 mg by mouth 2 (two) times daily before a meal.)  ? predniSONE (DELTASONE) 5 MG tablet Take 1.5 tablets (7.5 mg total) by mouth daily with breakfast.  ? prochlorperazine (COMPAZINE) 10 MG tablet Take 1 tablet (10 mg total) by mouth every 6 (six) hours as needed for nausea or vomiting.  ? simvastatin (ZOCOR) 40 MG tablet Take 1 tablet (40 mg total) by mouth daily at 6 PM.  ? ?  No facility-administered encounter medications on file as of 06/26/2021.  ? ? ?Allergies (verified) ?Penicillins and Albumin (human)  ? ?History: ?Past Medical History:  ?Diagnosis Date  ? CAD (coronary artery disease)   ? Cancer [facility])   ? SKIN.[name]  ? Cataract   ? Diabetes mellitus   ? 2  ? DVT (deep venous thrombosis) ([place]) 03/30/2001  ? RLE DVT, post ankle fracture  ? GERD (gastroesophageal reflux disease)   ? Hyperlipidemia   ? Hypertension   ? Lung cancer ([place])  12/10/2020  ? 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 Peripheral neuropathy   ? toes  ? ?Past Surgical History:  ?Procedure Laterality Date  ? BRONCHIAL BIOPSY  12/10/2020  ? Procedure: BRONCHIAL BIOPSIES;  Surgeon: Collene Gobble, MD;  Location: [facility] ENDOSCOPY;  Service: Pulmonary;;  ? BRONCHIAL BRUSHINGS  12/10/2020  ? Procedure: BRONCHIAL BRUSHINGS;  Surgeon: Collene Gobble, MD;  Location: [facility] ENDOSCOPY;  Service: Pulmonary;;  ? BRONCHIAL NEEDLE ASPIRATION BIOPSY  12/10/2020  ? Procedure: BRONCHIAL NEEDLE ASPIRATION BIOPSIES;  Surgeon: Collene Gobble, MD;  Location: [facility] ENDOSCOPY;  Service: Pulmonary;;  ? COLONOSCOPY    ? COLONOSCOPY W/ POLYPECTOMY  03/10/2009  ? LARYNGOSCOPY  03/10/1997  ? VIDEO BRONCHOSCOPY WITH ENDOBRONCHIAL NAVIGATION N/A 12/10/2020  ? Procedure: ROBOTIC VIDEO BRONCHOSCOPY WITH ENDOBRONCHIAL NAVIGATION;  Surgeon: Collene Gobble, MD;  Location: [facility] ENDOSCOPY;  Service: Pulmonary;  Laterality: N/A;  ? ?Family History  ?Problem Relation Age of Onset  ? Lung cancer Mother   ?     X 2  ? Colon polyps Father   ? Colon cancer Father 86  ? Coronary artery disease Father   ? Lung disease Father   ? Esophageal cancer Neg Hx   ? Stomach cancer Neg Hx   ? Rectal cancer Neg Hx   ? ?Social History  ? ?Socioeconomic History  ? Marital status: Divorced  ?  Spouse name: Not on file  ? Number of children: 0  ? Years of education: Not on file  ? Highest education level: Not on file  ?Occupational History  ? Occupation: Retired Clinical biochemist  ?Tobacco Use  ? Smoking status: Every Day  ?  Packs/day: 0.25  ?  Years: 58.00  ?  Pack years: 14.50  ?  Types: Cigarettes  ? Smokeless tobacco: Never  ? Tobacco comments:  ?  2+ packs smoked daily ARJ 12/05/20  ?Vaping Use  ? Vaping Use: Never used  ?Substance and Sexual Activity  ? Alcohol use: No  ?  Alcohol/week: 0.0 standard drinks  ?  Comment:  none since 04/2012  ? Drug use: Never  ? Sexual activity: Not on file  ?Other Topics Concern  ? Not on file  ?Social History  Narrative  ? REG EXERCISE  ?   ?   ? ?Social Determinants of Health  ? ?Financial Resource Strain: Low Risk   ? Difficulty of Paying Living Expenses: Not hard at all  ?Food Insecurity: No Food Insecurity  ? Worried About Charity fundraiser in the Last Year: Never true  ? Ran Out of Food in the Last Year: Never true  ?Transportation Needs: No Transportation Needs  ? Lack of Transportation (Medical): No  ? Lack of Transportation (Non-Medical): No  ?Physical Activity: Sufficiently Active  ? Days of Exercise per Week: 3 days  ? Minutes of Exercise per Session: 60 min  ?Stress: No Stress Concern Present  ? Feeling of Stress : Not at all  ?Social Connections: Socially  Isolated  ? Frequency of Communication with Friends and Family: More than three times a week  ? Frequency of Social Gatherings with Friends and Family: Three times a week  ? Attends Religious Services: Never  ? Active Member of Clubs or Organizations: No  ? Attends Archivist Meetings: Never  ? Marital Status: Divorced  ? ? ?Tobacco Counseling ?Ready to quit: Not Answered ?Counseling given: Not Answered ?Tobacco comments: 2+ packs smoked daily ARJ 12/05/20 ? ? ?Clinical Intake: ? ?Pre-visit preparation completed: Yes ? ?Pain : No/denies pain ? ?  ? ?Nutritional Risks: None ?Diabetes: Yes ?CBG done?: No ?Did pt. bring in CBG monitor from home?: No ? ?How often do you need to have someone help you when you read instructions, pamphlets, or other written materials from your doctor or pharmacy?: 1 - Never ?What is the last grade level you completed in school?: 1 year of collge, drafted into [employer] ? ?Diabetic? yes ? ?Interpreter Needed?: No ? ?Information entered by :: Lisette Abu, LPN ? ? ?Activities of Daily Living ? ?  06/26/2021  ? 11:52 AM 05/23/2021  ? 10:16 PM  ?In your present state of health, do you have any difficulty performing the following activities:  ?Hearing? 0 0  ?Vision? 0 0  ?Difficulty concentrating or making decisions? 0 0   ?Walking or climbing stairs? 0 0  ?Dressing or bathing? 0 0  ?Doing errands, shopping? 0 0  ?Preparing Food and eating ? N   ?Using the Toilet? N   ?In the past six months, have you accidently leaked urine? N   ?Do you

## 2021-06-26 NOTE — Patient Instructions (Signed)
Brandon Rubio , ?Thank you for taking time to come for your Medicare Wellness Visit. I appreciate your ongoing commitment to your health goals. Please review the following plan we discussed and let me know if I can assist you in the future.  ? ?Screening recommendations/referrals: ?Colonoscopy: 12/31/2020; due every 7 years ?Recommended yearly ophthalmology/optometry visit for glaucoma screening and checkup ?Recommended yearly dental visit for hygiene and checkup ? ?Vaccinations: ?Influenza vaccine: 01/16/2021 ?Pneumococcal vaccine: 09/02/2017 ?Tdap vaccine: 11/18/2017; due every 10 years ?Shingles vaccine: never done   ?Covid-19: 06/14/2019, 07/10/2019, 02/21/2020 ? ?Advanced directives: No; Advance directive discussed with you today. Even though you declined this today please call our office should you change your mind and we can give you the proper paperwork for you to fill out. ? ?Conditions/risks identified: Yes; Client understands the importance of follow-up with providers by attending scheduled visits ? ?Next appointment: Please schedule your next Medicare Wellness Visit with your Nurse Health Advisor in 1 year by calling [phone]. ? ?Preventive Care 73 Years and Older, Male ?Preventive care refers to lifestyle choices and visits with your health care provider that can promote health and wellness. ?What does preventive care include? ?A yearly physical exam. This is also called an annual well check. ?Dental exams once or twice a year. ?Routine eye exams. Ask your health care provider how often you should have your eyes checked. ?Personal lifestyle choices, including: ?Daily care of your teeth and gums. ?Regular physical activity. ?Eating a healthy diet. ?Avoiding tobacco and drug use. ?Limiting alcohol use. ?Practicing safe sex. ?Taking low doses of aspirin every day. ?Taking vitamin and mineral supplements as recommended by your health care provider. ?What happens during an annual well check? ?The services and  screenings done by your health care provider during your annual well check will depend on your age, overall health, lifestyle risk factors, and family history of disease. ?Counseling  ?Your health care provider may ask you questions about your: ?Alcohol use. ?Tobacco use. ?Drug use. ?Emotional well-being. ?Home and relationship well-being. ?Sexual activity. ?Eating habits. ?History of falls. ?Memory and ability to understand (cognition). ?Work and work Statistician. ?Screening  ?You may have the following tests or measurements: ?Height, weight, and BMI. ?Blood pressure. ?Lipid and cholesterol levels. These may be checked every 5 years, or more frequently if you are over 29 years old. ?Skin check. ?Lung cancer screening. You may have this screening every year starting at age 82 if you have a 30-pack-year history of smoking and currently smoke or have quit within the past 15 years. ?Fecal occult blood test (FOBT) of the stool. You may have this test every year starting at age 47. ?Flexible sigmoidoscopy or colonoscopy. You may have a sigmoidoscopy every 5 years or a colonoscopy every 10 years starting at age 22. ?Prostate cancer screening. Recommendations will vary depending on your family history and other risks. ?Hepatitis C blood test. ?Hepatitis B blood test. ?Sexually transmitted disease (STD) testing. ?Diabetes screening. This is done by checking your blood sugar (glucose) after you have not eaten for a while (fasting). You may have this done every 1-3 years. ?Abdominal aortic aneurysm (AAA) screening. You may need this if you are a current or former smoker. ?Osteoporosis. You may be screened starting at age 70 if you are at high risk. ?Talk with your health care provider about your test results, treatment options, and if necessary, the need for more tests. ?Vaccines  ?Your health care provider may recommend certain vaccines, such as: ?Influenza vaccine. This is  recommended every year. ?Tetanus, diphtheria, and  acellular pertussis (Tdap, Td) vaccine. You may need a Td booster every 10 years. ?Zoster vaccine. You may need this after age 62. ?Pneumococcal 13-valent conjugate (PCV13) vaccine. One dose is recommended after age 40. ?Pneumococcal polysaccharide (PPSV23) vaccine. One dose is recommended after age 57. ?Talk to your health care provider about which screenings and vaccines you need and how often you need them. ?This information is not intended to replace advice given to you by your health care provider. Make sure you discuss any questions you have with your health care provider. ?Document Released: 03/23/2015 Document Revised: 11/14/2015 Document Reviewed: 12/26/2014 ?Elsevier Interactive Patient Education ? 2017 [place]. ? ?Fall Prevention in the Home ?Falls can cause injuries. They can happen to people of all ages. There are many things you can do to make your home safe and to help prevent falls. ?What can I do on the outside of my home? ?Regularly fix the edges of walkways and driveways and fix any cracks. ?Remove anything that might make you trip as you walk through a door, such as a raised step or threshold. ?Trim any bushes or trees on the path to your home. ?Use bright outdoor lighting. ?Clear any walking paths of anything that might make someone trip, such as rocks or tools. ?Regularly check to see if handrails are loose or broken. Make sure that both sides of any steps have handrails. ?Any raised decks and porches should have guardrails on the edges. ?Have any leaves, snow, or ice cleared regularly. ?Use sand or salt on walking paths during winter. ?Clean up any spills in your garage right away. This includes oil or grease spills. ?What can I do in the bathroom? ?Use night lights. ?Install grab bars by the toilet and in the tub and shower. Do not use towel bars as grab bars. ?Use non-skid mats or decals in the tub or shower. ?If you need to sit down in the shower, use a plastic, non-slip stool. ?Keep  the floor dry. Clean up any water that spills on the floor as soon as it happens. ?Remove soap buildup in the tub or shower regularly. ?Attach bath mats securely with double-sided non-slip rug tape. ?Do not have throw rugs and other things on the floor that can make you trip. ?What can I do in the bedroom? ?Use night lights. ?Make sure that you have a light by your bed that is easy to reach. ?Do not use any sheets or blankets that are too big for your bed. They should not hang down onto the floor. ?Have a firm chair that has side arms. You can use this for support while you get dressed. ?Do not have throw rugs and other things on the floor that can make you trip. ?What can I do in the kitchen? ?Clean up any spills right away. ?Avoid walking on wet floors. ?Keep items that you use a lot in easy-to-reach places. ?If you need to reach something above you, use a strong step stool that has a grab bar. ?Keep electrical cords out of the way. ?Do not use floor polish or wax that makes floors slippery. If you must use wax, use non-skid floor wax. ?Do not have throw rugs and other things on the floor that can make you trip. ?What can I do with my stairs? ?Do not leave any items on the stairs. ?Make sure that there are handrails on both sides of the stairs and use them. Fix handrails that are  broken or loose. Make sure that handrails are as long as the stairways. ?Check any carpeting to make sure that it is firmly attached to the stairs. Fix any carpet that is loose or worn. ?Avoid having throw rugs at the top or bottom of the stairs. If you do have throw rugs, attach them to the floor with carpet tape. ?Make sure that you have a light switch at the top of the stairs and the bottom of the stairs. If you do not have them, ask someone to add them for you. ?What else can I do to help prevent falls? ?Wear shoes that: ?Do not have high heels. ?Have rubber bottoms. ?Are comfortable and fit you well. ?Are closed at the toe. Do not  wear sandals. ?If you use a stepladder: ?Make sure that it is fully opened. Do not climb a closed stepladder. ?Make sure that both sides of the stepladder are locked into place. ?Ask someone to hold it fo

## 2021-06-27 ENCOUNTER — Inpatient Hospital Stay: Payer: Medicare Other | Attending: Internal Medicine

## 2021-06-27 ENCOUNTER — Ambulatory Visit (HOSPITAL_COMMUNITY)
Admission: RE | Admit: 2021-06-27 | Discharge: 2021-06-27 | Disposition: A | Discharge status: Home / Self Care | Payer: Medicare Other | Source: Ambulatory Visit | Attending: Internal Medicine | Admitting: Internal Medicine

## 2021-06-27 ENCOUNTER — Other Ambulatory Visit: Payer: Self-pay

## 2021-06-27 DIAGNOSIS — C3490 Malignant neoplasm of unspecified part of unspecified bronchus or lung: Secondary | ICD-10-CM

## 2021-06-27 DIAGNOSIS — E109 Type 1 diabetes mellitus without complications: Secondary | ICD-10-CM | POA: Diagnosis not present

## 2021-06-27 DIAGNOSIS — Z7984 Long term (current) use of oral hypoglycemic drugs: Secondary | ICD-10-CM | POA: Insufficient documentation

## 2021-06-27 DIAGNOSIS — Z7952 Long term (current) use of systemic steroids: Secondary | ICD-10-CM | POA: Diagnosis not present

## 2021-06-27 DIAGNOSIS — C3431 Malignant neoplasm of lower lobe, right bronchus or lung: Secondary | ICD-10-CM | POA: Insufficient documentation

## 2021-06-27 DIAGNOSIS — Z79899 Other long term (current) drug therapy: Secondary | ICD-10-CM | POA: Insufficient documentation

## 2021-06-27 DIAGNOSIS — Z9221 Personal history of antineoplastic chemotherapy: Secondary | ICD-10-CM | POA: Diagnosis not present

## 2021-06-27 DIAGNOSIS — Z923 Personal history of irradiation: Secondary | ICD-10-CM | POA: Diagnosis not present

## 2021-06-27 DIAGNOSIS — Z86718 Personal history of other venous thrombosis and embolism: Secondary | ICD-10-CM | POA: Insufficient documentation

## 2021-06-27 DIAGNOSIS — I700 Atherosclerosis of aorta: Secondary | ICD-10-CM | POA: Diagnosis not present

## 2021-06-27 DIAGNOSIS — R918 Other nonspecific abnormal finding of lung field: Secondary | ICD-10-CM | POA: Diagnosis not present

## 2021-06-27 DIAGNOSIS — J439 Emphysema, unspecified: Secondary | ICD-10-CM | POA: Diagnosis not present

## 2021-06-27 LAB — CBC WITH DIFFERENTIAL (CANCER CENTER ONLY)
Abs Immature Granulocytes: 0.02 10*3/uL (ref 0.00–0.07)
Basophils Absolute: 0.1 10*3/uL (ref 0.0–0.1)
Basophils Relative: 1 %
Eosinophils Absolute: 0.3 10*3/uL (ref 0.0–0.5)
Eosinophils Relative: 3 %
HCT: 32.2 % — ABNORMAL LOW (ref 39.0–52.0)
Hemoglobin: 11.2 g/dL — ABNORMAL LOW (ref 13.0–17.0)
Immature Granulocytes: 0 %
Lymphocytes Relative: 9 %
Lymphs Abs: 0.8 10*3/uL (ref 0.7–4.0)
MCH: 31.8 pg (ref 26.0–34.0)
MCHC: 34.8 g/dL (ref 30.0–36.0)
MCV: 91.5 fL (ref 80.0–100.0)
Monocytes Absolute: 0.5 10*3/uL (ref 0.1–1.0)
Monocytes Relative: 6 %
Neutro Abs: 7 10*3/uL (ref 1.7–7.7)
Neutrophils Relative %: 81 %
Platelet Count: 182 10*3/uL (ref 150–400)
RBC: 3.52 MIL/uL — ABNORMAL LOW (ref 4.22–5.81)
RDW: 13.2 % (ref 11.5–15.5)
WBC Count: 8.7 10*3/uL (ref 4.0–10.5)
nRBC: 0 % (ref 0.0–0.2)

## 2021-06-27 LAB — CMP (CANCER CENTER ONLY)
ALT: 10 U/L (ref 0–44)
AST: 15 U/L (ref 15–41)
Albumin: 3.9 g/dL (ref 3.5–5.0)
Alkaline Phosphatase: 51 U/L (ref 38–126)
Anion gap: 7 (ref 5–15)
BUN: 12 mg/dL (ref 8–23)
CO2: 27 mmol/L (ref 22–32)
Calcium: 8.9 mg/dL (ref 8.9–10.3)
Chloride: 104 mmol/L (ref 98–111)
Creatinine: 0.77 mg/dL (ref 0.61–1.24)
GFR, Estimated: >60 mL/min (ref >60)
Glucose, Bld: 133 mg/dL — ABNORMAL HIGH (ref 70–99)
Potassium: 3.5 mmol/L (ref 3.5–5.1)
Sodium: 138 mmol/L (ref 135–145)
Total Bilirubin: 0.3 mg/dL (ref 0.3–1.2)
Total Protein: 6.7 g/dL (ref 6.5–8.1)

## 2021-06-27 IMAGING — CT CT CHEST W/ CM
2 of 4 series · 14 of 36 positions shown, 17 images · non-contrast
Comparison: Most recent CT chest [DATE].  [DATE] PET-CT.

CLINICAL DATA: Primary Cancer Type: Lung
TECHNIQUE: Multidetector CT imaging of the chest was performed during
intravenous contrast administration.

[Series 2: axial st · axial · 0.82mm/px · z∈[+1453,+1697]mm · 11 of 146 slices shown, 14 images]
[im 12/146  mediastinal]
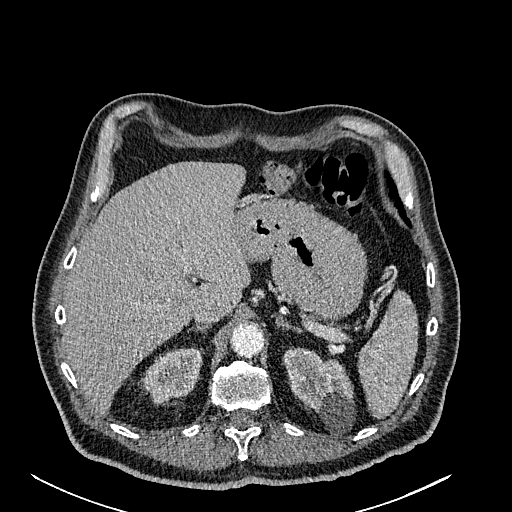
[im 12/146  lung]
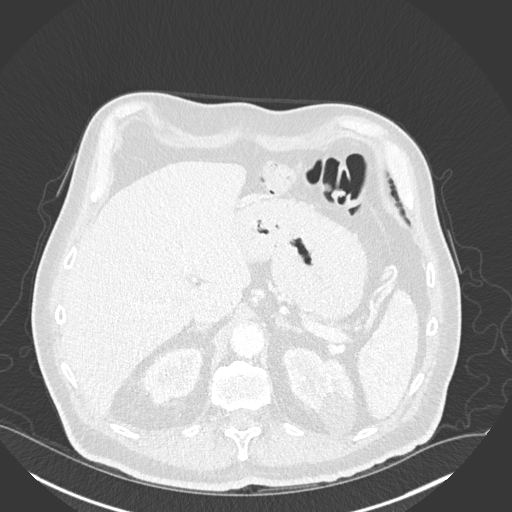
[im 23/146  lung]
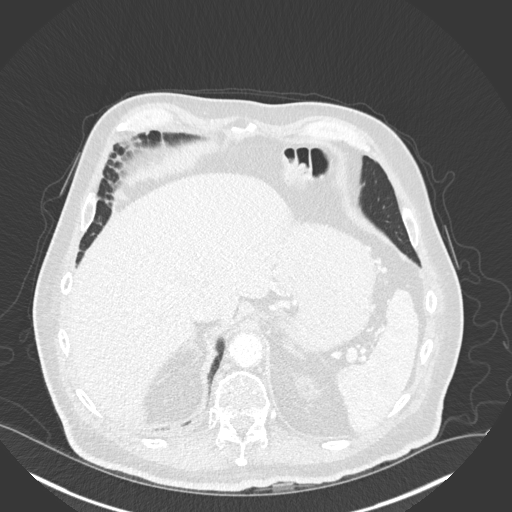
[im 34/146  lung]
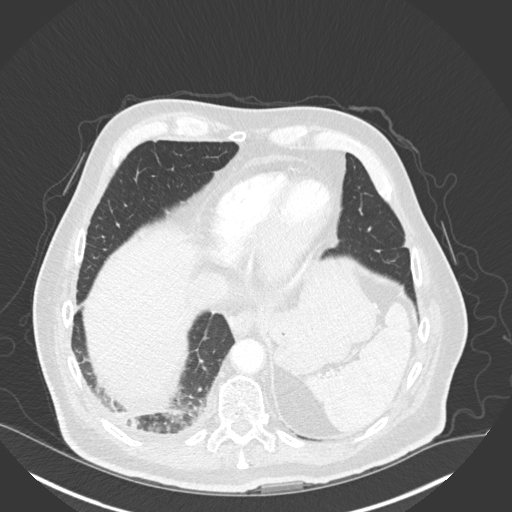
[im 45/146  lung]
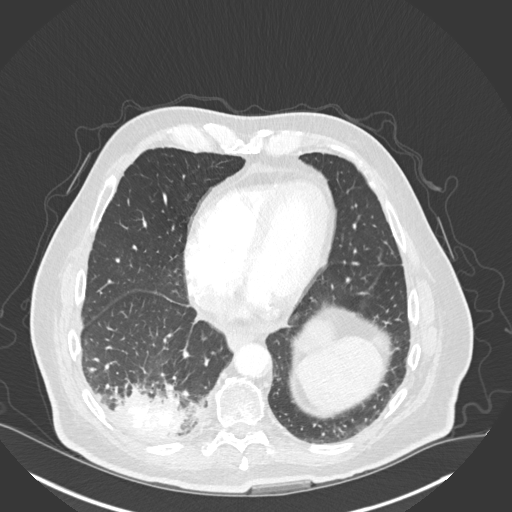
[im 56/146  mediastinal]
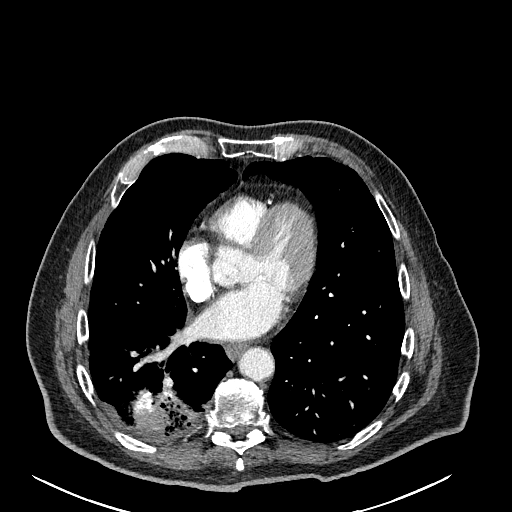
[im 56/146  lung]
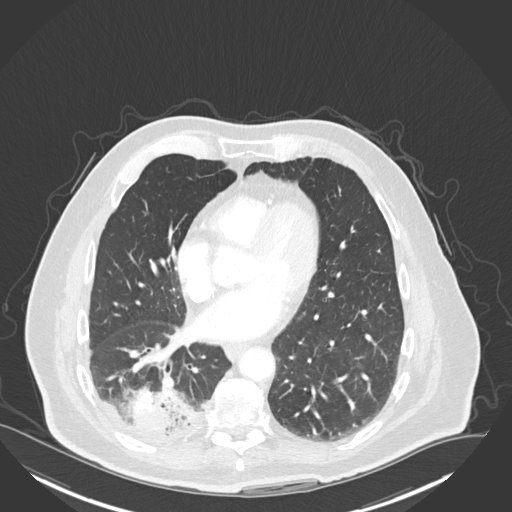
[im 79/146  lung]
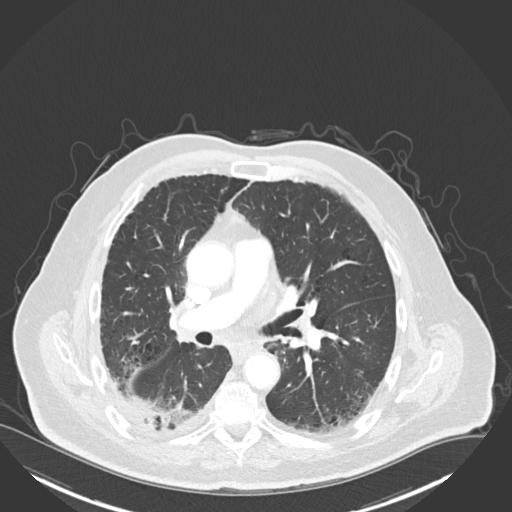
[im 90/146  lung]
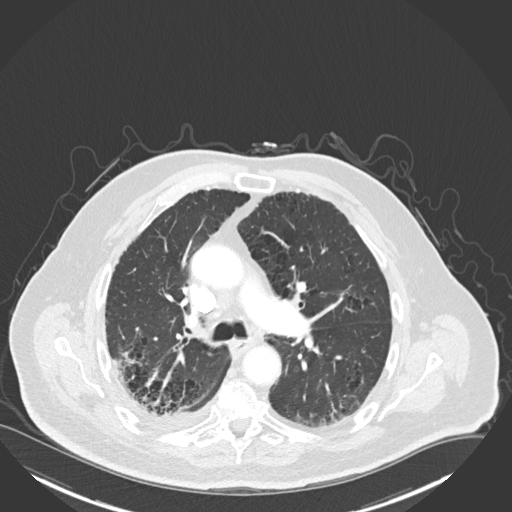
[im 101/146  lung]
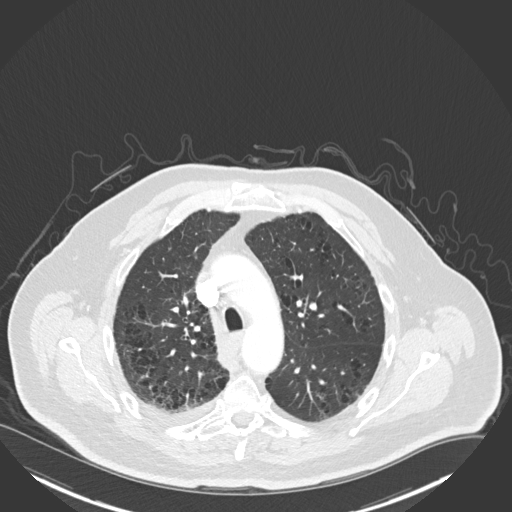
[im 112/146  mediastinal]
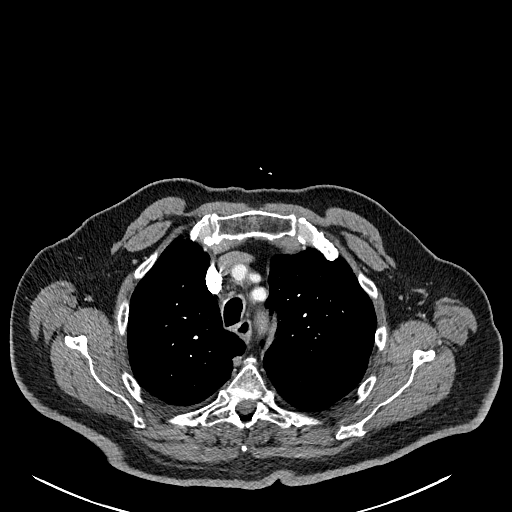
[im 112/146  lung]
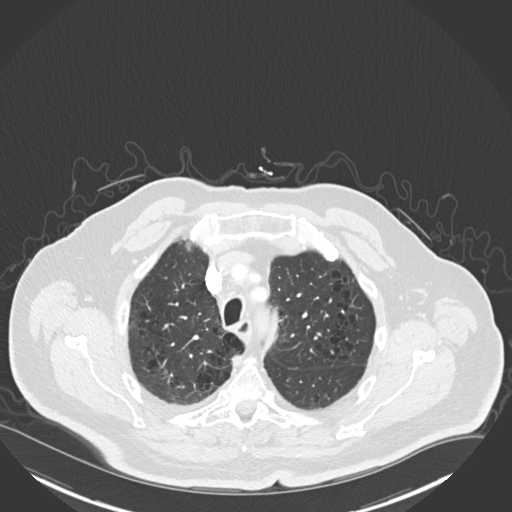
[im 123/146  lung]
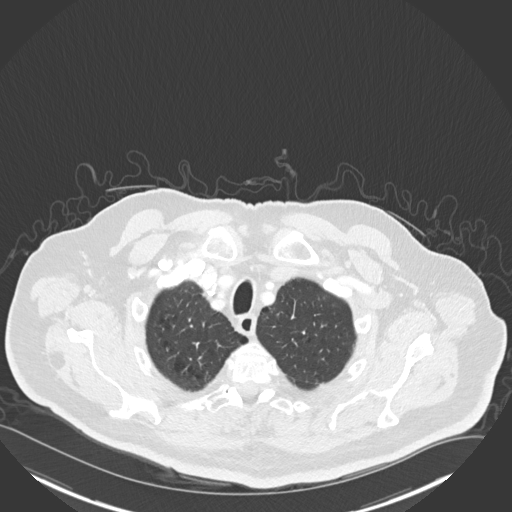
[im 134/146  lung]
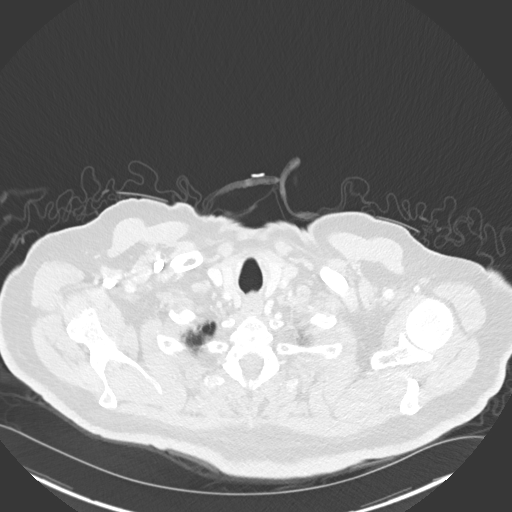

[Series 7: coronal · coronal · 0.61mm/px · 3 of 177 slices shown]
[im 36/177  lung]
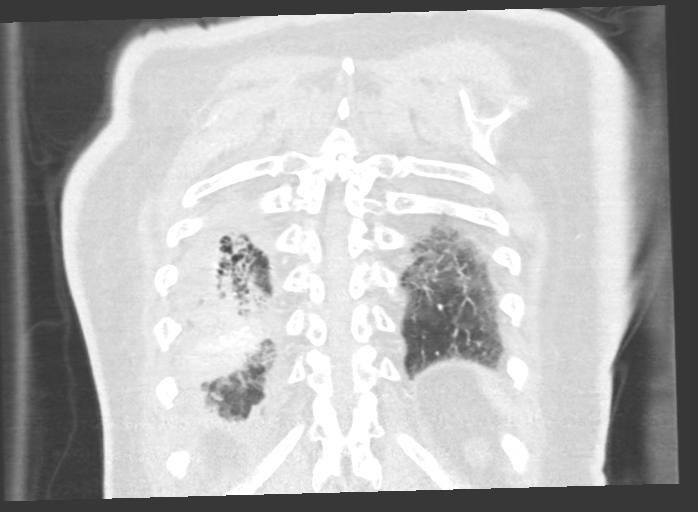
[im 71/177  lung]
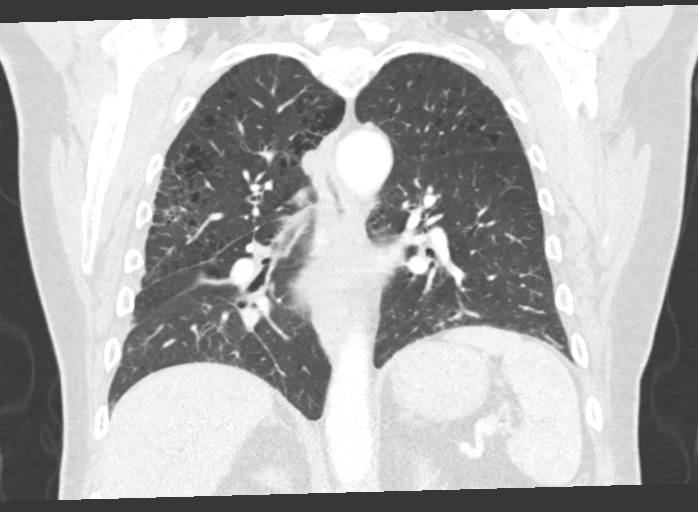
[im 106/177  lung]
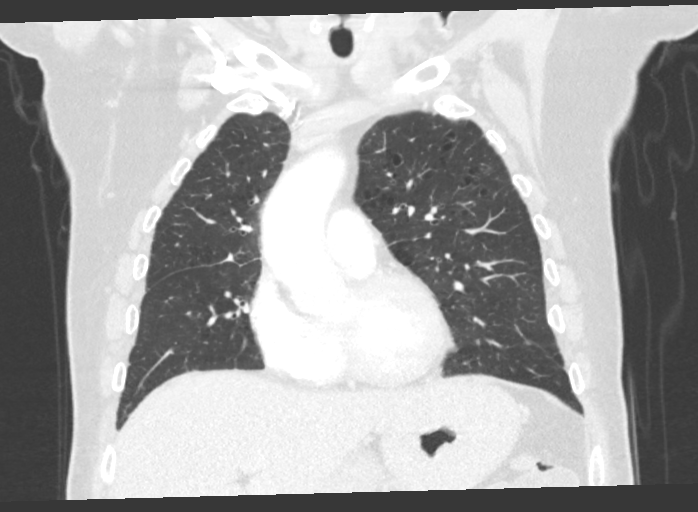

[14 of 36 positions shown; findings below may reference images not displayed]

Imaging Indication: Assess response to therapy

Interval therapy since last imaging? Yes

Initial Cancer Diagnosis

Date: [DATE]; Established by: Biopsy-proven

Detailed Pathology: Stage IIIA non-small cell lung cancer, favoring
adenocarcinoma.

Primary Tumor location: Right lower lobe.

Surgeries: No.  [DATE].

Chemotherapy: Yes; Ongoing? No; Most recent administration:
[DATE]

Immunotherapy? No

Radiation therapy? Yes; Date Range: [DATE] - [DATE]; Target:
Right lower lung

* Tracking Code: BO *

EXAM:
CT CHEST WITH CONTRAST
RADIATION DOSE REDUCTION: This exam was performed according to the
departmental dose-optimization program which includes automated
exposure control, adjustment of the mA and/or kV according to
patient size and/or use of iterative reconstruction technique.

CONTRAST:  75mL OMNIPAQUE IOHEXOL 300 MG/ML  SOLN
FINDINGS: Cardiovascular: Aortic atherosclerosis. Normal heart size, without
pericardial effusion. Three vessel coronary artery calcification. No
central pulmonary embolism, on this non-dedicated study.

Mediastinum/Nodes: No supraclavicular adenopathy. No mediastinal or
hilar adenopathy.

Lungs/Pleura: Trace right pleural fluid, similar.

Centrilobular and paraseptal emphysema.

Minimal nodularity along the right minor fissure is similar on 77/5.

Pleural-based right lower lobe lung mass measures 5.5 x 3.6 cm on
101/5 versus 5.9 x 3.9 cm on [DATE].

Slight increase in surrounding airspace disease and ground-glass,
likely radiation induced

Upper Abdomen: Normal imaged portions of the liver, spleen, adrenal
glands, right kidney. Upper pole left renal 2.9 cm low-density
lesion is likely a cyst, incompletely imaged.

The proximal stomach is thick walled, present back to [DATE].
Example 3.0 cm on 131/2.

Musculoskeletal: No acute osseous abnormality.
IMPRESSION: 1. Since [DATE], similar size of a right lower lobe
pleural-based lung mass with slight increase in surrounding
presumably radiation induced consolidation and ground-glass.
2. No thoracic adenopathy. Thoracic adenopathy on [DATE] has
resolved.
3. Aortic atherosclerosis ([PX]-[PX]), coronary artery
atherosclerosis and emphysema ([PX]-[PX]).
4. Gastric wall thickening back to [DATE]. Correlate with
symptoms of gastritis or other gastric pathology. Of note, this was
not significantly hypermetabolic on PET of [DATE].

* Tracking Code: BO *

## 2021-06-27 MED ORDER — SODIUM CHLORIDE (PF) 0.9 % IJ SOLN
INTRAMUSCULAR | Status: AC
  Filled 2021-06-27: qty 50

## 2021-06-27 MED ORDER — IOHEXOL 300 MG/ML  SOLN
75.0000 mL | Freq: Once | INTRAMUSCULAR | Status: AC | PRN
Start: 2021-06-27 — End: 2021-06-27
  Administered 2021-06-27: 75 mL via INTRAVENOUS

## 2021-07-01 ENCOUNTER — Encounter: Payer: Self-pay | Admitting: Internal Medicine

## 2021-07-01 ENCOUNTER — Encounter: Payer: Self-pay | Admitting: *Deleted

## 2021-07-01 ENCOUNTER — Inpatient Hospital Stay (HOSPITAL_BASED_OUTPATIENT_CLINIC_OR_DEPARTMENT_OTHER): Payer: Medicare Other | Admitting: Internal Medicine

## 2021-07-01 ENCOUNTER — Other Ambulatory Visit: Payer: Self-pay

## 2021-07-01 VITALS — BP 127/68 | HR 110 | Temp 98.7°F | Resp 17 | Wt 188.0 lb

## 2021-07-01 DIAGNOSIS — C3491 Malignant neoplasm of unspecified part of right bronchus or lung: Secondary | ICD-10-CM

## 2021-07-01 DIAGNOSIS — Z7952 Long term (current) use of systemic steroids: Secondary | ICD-10-CM | POA: Diagnosis not present

## 2021-07-01 DIAGNOSIS — R5382 Chronic fatigue, unspecified: Secondary | ICD-10-CM

## 2021-07-01 DIAGNOSIS — E109 Type 1 diabetes mellitus without complications: Secondary | ICD-10-CM | POA: Diagnosis not present

## 2021-07-01 DIAGNOSIS — Z9221 Personal history of antineoplastic chemotherapy: Secondary | ICD-10-CM | POA: Diagnosis not present

## 2021-07-01 DIAGNOSIS — Z923 Personal history of irradiation: Secondary | ICD-10-CM | POA: Diagnosis not present

## 2021-07-01 DIAGNOSIS — Z5112 Encounter for antineoplastic immunotherapy: Secondary | ICD-10-CM | POA: Diagnosis not present

## 2021-07-01 DIAGNOSIS — R110 Nausea: Secondary | ICD-10-CM

## 2021-07-01 DIAGNOSIS — C3431 Malignant neoplasm of lower lobe, right bronchus or lung: Secondary | ICD-10-CM | POA: Diagnosis not present

## 2021-07-01 DIAGNOSIS — Z86718 Personal history of other venous thrombosis and embolism: Secondary | ICD-10-CM | POA: Diagnosis not present

## 2021-07-01 MED ORDER — PROCHLORPERAZINE MALEATE 10 MG PO TABS: 10.0000 mg | ORAL_TABLET | Freq: Four times a day (QID) | ORAL | 0 refills | Status: DC | PRN

## 2021-07-01 NOTE — Progress Notes (Signed)
?    [place] ?[phone]   [phone] ? ?OFFICE PROGRESS NOTE ? ?[name], MD ?[place] [address] ? ?DIAGNOSIS: Stage IIIA (T4, N0, M0) non-small cell lung cancer, favoring adenocarcinoma presented with large right lower lobe lung mass with no mediastinal lymphadenopathy or extrathoracic metastasis diagnosed in October 2022. Had disease progression with enlarging mass and new right hilar and mediastinal lymph nodes in December 2022.  ?  ?  ?PRIOR THERAPY:  ?1) Neoadjuvant treatment with carboplatin for AUC of 5, Alimta 500 Mg/M2 and nivolumab 360 Mg IV every 3 weeks.  First dose January 23, 2021.  Status post 3 cycles. ?2) Concurrent chemoradiation with carboplatin for an AUC of 2 and paclitaxel 45 mg per metered squared.  First dose expected on 04/01/2021.  Status post 4 cycles ?  ?CURRENT THERAPY: Consolidation treatment with immunotherapy with Imfinzi 1500 Mg IV every 4 weeks.  First dose on Jul 08, 2021. ? ?INTERVAL HISTORY: ?Brandon Rubio. 71 y.o. male returns to the clinic today for follow-up visit.  The patient is feeling fine today with no concerning complaints except for dry heaves and fatigue.  He also complains of low back pain.  He has no nausea, vomiting, diarrhea or constipation.  He has no headache or visual changes.  He has no chest pain, shortness of breath, cough or hemoptysis.  He tolerated the previous course of concurrent chemoradiation well except for the fatigue and few hospitalization because of lack of appetite and dehydration.  He had repeat CT scan of the chest performed recently and he is here for evaluation and discussion of his scan results. ? ? ?MEDICAL HISTORY: ?Past Medical History:  ?Diagnosis Date  ? CAD (coronary artery disease)   ? Cancer [facility])   ? SKIN.[name]  ? Cataract   ? Diabetes mellitus   ? 2  ? DVT (deep venous thrombosis) ([place]) 03/30/2001  ? RLE DVT, post ankle fracture  ? GERD  (gastroesophageal reflux disease)   ? Hyperlipidemia   ? Hypertension   ? Lung cancer ([place]) 12/10/2020  ? 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 Peripheral neuropathy   ? toes  ? ? ?ALLERGIES:  is allergic to penicillins and albumin (human). ? ?MEDICATIONS:  ?Current Outpatient Medications  ?Medication Sig Dispense Refill  ? aspirin EC 81 MG tablet Take 81 mg by mouth in the morning. Swallow whole.    ? bisacodyl (DULCOLAX) 5 MG EC tablet Take 5 mg by mouth daily as needed for moderate constipation.    ? folic acid (FOLVITE) 1 MG tablet Take 1 tablet (1 mg total) by mouth daily. 30 tablet 4  ? HYDROcodone-acetaminophen (NORCO) 5-325 MG tablet Take 1 tablet by mouth every 6 (six) hours as needed for moderate pain. 60 tablet 0  ? metFORMIN (GLUCOPHAGE) 1000 MG tablet TAKE 1 TABLET(1000 MG) BY MOUTH TWICE DAILY WITH A MEAL 180 tablet 1  ? midodrine (PROAMATINE) 10 MG tablet Take 1 tablet (10 mg total) by mouth 3 (three) times daily with meals. 90 tablet 0  ? Multiple Vitamins-Minerals (CENTRUM SILVER 50+MEN) TABS Take 1 tablet by mouth daily with breakfast.    ? omeprazole (PRILOSEC) 20 MG capsule TAKE 1 CAPSULE(20 MG) BY MOUTH TWICE DAILY BEFORE A MEAL (Patient taking differently: Take 20 mg by mouth 2 (two) times daily before a meal.) 180 capsule 3  ? predniSONE (DELTASONE) 5 MG tablet Take 1.5 tablets (7.5 mg total) by mouth daily with breakfast.  60 tablet 5  ? prochlorperazine (COMPAZINE) 10 MG tablet Take 1 tablet (10 mg total) by mouth every 6 (six) hours as needed for nausea or vomiting. 30 tablet 0  ? simvastatin (ZOCOR) 40 MG tablet Take 1 tablet (40 mg total) by mouth daily at 6 PM. 90 tablet 3  ? ?No current facility-administered medications for this visit.  ? ? ?SURGICAL HISTORY:  ?Past Surgical History:  ?Procedure Laterality Date  ? BRONCHIAL BIOPSY  12/10/2020  ? Procedure: BRONCHIAL BIOPSIES;  Surgeon: Collene Gobble, MD;  Location: [facility] ENDOSCOPY;  Service: Pulmonary;;  ? BRONCHIAL BRUSHINGS  12/10/2020   ? Procedure: BRONCHIAL BRUSHINGS;  Surgeon: Collene Gobble, MD;  Location: [facility] ENDOSCOPY;  Service: Pulmonary;;  ? BRONCHIAL NEEDLE ASPIRATION BIOPSY  12/10/2020  ? Procedure: BRONCHIAL NEEDLE ASPIRATION BIOPSIES;  Surgeon: Collene Gobble, MD;  Location: [facility] ENDOSCOPY;  Service: Pulmonary;;  ? COLONOSCOPY    ? COLONOSCOPY W/ POLYPECTOMY  03/10/2009  ? LARYNGOSCOPY  03/10/1997  ? VIDEO BRONCHOSCOPY WITH ENDOBRONCHIAL NAVIGATION N/A 12/10/2020  ? Procedure: ROBOTIC VIDEO BRONCHOSCOPY WITH ENDOBRONCHIAL NAVIGATION;  Surgeon: Collene Gobble, MD;  Location: [place] [facility] ENDOSCOPY;  Service: Pulmonary;  Laterality: N/A;  ? ? ?REVIEW OF SYSTEMS:  Constitutional: positive for fatigue ?Eyes: negative ?Ears, nose, mouth, throat, and face: negative ?Respiratory: negative ?Cardiovascular: negative ?Gastrointestinal: positive for nausea ?Genitourinary:negative ?Integument/breast: negative ?Hematologic/lymphatic: negative ?Musculoskeletal:positive for back pain ?Neurological: positive for dizziness ?Behavioral/Psych: negative ?Endocrine: negative ?Allergic/Immunologic: negative  ? ?PHYSICAL EXAMINATION: General appearance: alert, cooperative, fatigued, and no distress ?Head: Normocephalic, without obvious abnormality, atraumatic ?Neck: no adenopathy, no JVD, supple, symmetrical, trachea midline, and thyroid not enlarged, symmetric, no tenderness/mass/nodules ?Lymph nodes: Cervical, supraclavicular, and axillary nodes normal. ?Resp: clear to auscultation bilaterally ?Back: symmetric, no curvature. ROM normal. No CVA tenderness. ?Cardio: regular rate and rhythm, S1, S2 normal, no murmur, click, rub or gallop ?GI: soft, non-tender; bowel sounds normal; no masses,  no organomegaly ?Extremities: extremities normal, atraumatic, no cyanosis or edema ?Neurologic: Alert and oriented X 3, normal strength and tone. Normal symmetric reflexes. Normal coordination and gait ? ?ECOG PERFORMANCE STATUS: 1 - Symptomatic but completely ambulatory ? ?Blood  pressure 127/68, pulse (!) 110, temperature 98.7 ?F (37.1 ?C), resp. rate 17, weight 188 lb (85.3 kg), SpO2 96 %. ? ?LABORATORY DATA: ?Lab Results  ?Component Value Date  ? WBC 8.7 06/27/2021  ? HGB 11.2 (L) 06/27/2021  ? HCT 32.2 (L) 06/27/2021  ? MCV 91.5 06/27/2021  ? PLT 182 06/27/2021  ? ? ?  Chemistry   ?   ?Component Value Date/Time  ? NA 138 06/27/2021 0824  ? K 3.5 06/27/2021 0824  ? CL 104 06/27/2021 0824  ? CO2 27 06/27/2021 0824  ? BUN 12 06/27/2021 0824  ? CREATININE 0.77 06/27/2021 0824  ?    ?Component Value Date/Time  ? CALCIUM 8.9 06/27/2021 0824  ? ALKPHOS 51 06/27/2021 0824  ? AST 15 06/27/2021 0824  ? ALT 10 06/27/2021 0824  ? BILITOT 0.3 06/27/2021 0824  ?  ? ? ? ?RADIOGRAPHIC STUDIES: ?CT Chest W Contrast ? ?Result Date: 06/27/2021 ?CLINICAL DATA:  Primary Cancer Type: Lung Imaging Indication: Assess response to therapy Interval therapy since last imaging? Yes Initial Cancer Diagnosis Date: 12/10/2020; Established by: Biopsy-proven Detailed Pathology: Stage IIIA non-small cell lung cancer, favoring adenocarcinoma. Primary Tumor location: Right lower lobe. Surgeries: No.  03/07/2021. Chemotherapy: Yes; Ongoing? No; Most recent administration: 04/22/2021 Immunotherapy? No Radiation therapy? Yes; Date Range: 04/01/2021 - 05/27/2021; Target: Right lower lung *  Tracking Code: BO * EXAM: CT CHEST WITH CONTRAST TECHNIQUE: Multidetector CT imaging of the chest was performed during intravenous contrast administration. RADIATION DOSE REDUCTION: This exam was performed according to the departmental dose-optimization program which includes automated exposure control, adjustment of the mA and/or kV according to patient size and/or use of iterative reconstruction technique. CONTRAST:  75mL OMNIPAQUE IOHEXOL 300 MG/ML  SOLN COMPARISON:  Most recent CT chest 05/10/2021.  11/28/2020 PET-CT. FINDINGS: Cardiovascular: Aortic atherosclerosis. Normal heart size, without pericardial effusion. Three vessel coronary  artery calcification. No central pulmonary embolism, on this non-dedicated study. Mediastinum/Nodes: No supraclavicular adenopathy. No mediastinal or hilar adenopathy. Lungs/Pleura: Trace right pleural fluid,

## 2021-07-01 NOTE — Progress Notes (Signed)
DISCONTINUE ON PATHWAY REGIMEN - Non-Small Cell Lung ? ? ?  Administer weekly: ?    Paclitaxel  ?    Carboplatin  ? ?**Always confirm dose/schedule in your pharmacy ordering system** ? ?REASON: Continuation Of Treatment ?PRIOR TREATMENT: [id number]: Carboplatin AUC=2 + Paclitaxel 45 mg/m2 Weekly During Radiation ?TREATMENT RESPONSE: Stable Disease (SD) ? ?START ON PATHWAY REGIMEN - Non-Small Cell Lung ? ? ?  A cycle is every 28 days: ?    Durvalumab  ? ?**Always confirm dose/schedule in your pharmacy ordering system** ? ?Patient Characteristics: ?Preoperative or Nonsurgical Candidate (Clinical Staging), Stage III - Nonsurgical Candidate (Nonsquamous and Squamous), PS = 0, 1 ?Therapeutic Status: Preoperative or Nonsurgical Candidate (Clinical Staging) ?AJCC T Category: cT4 ?AJCC N Category: cN2 ?AJCC M Category: cM0 ?AJCC 8 Stage Grouping: IIIB ?ECOG Performance Status: 1 ?Intent of Therapy: ?Curative Intent, Discussed with Patient ?

## 2021-07-01 NOTE — Patient Instructions (Addendum)
Steps to Quit Smoking ?Smoking tobacco is the leading cause of preventable death. It can affect almost every organ in the body. Smoking puts you and people around you at risk for many serious, long-lasting (chronic) diseases. Quitting smoking can be hard, but it is one of the best things that you can do for your health. It is never too late to quit. ?Do not give up if you cannot quit the first time. Some people need to try many times to quit. Do your best to stick to your quit plan, and talk with your doctor if you have any questions or concerns. ?How do I get ready to quit? ?Pick a date to quit. Set a date within the next 2 weeks to give you time to prepare. ?Write down the reasons why you are quitting. Keep this list in places where you will see it often. ?Tell your family, friends, and co-workers that you are quitting. Their support is important. ?Talk with your doctor about the choices that may help you quit. ?Find out if your health insurance will pay for these treatments. ?Know the people, places, things, and activities that make you want to smoke (triggers). Avoid them. ?What first steps can I take to quit smoking? ?Throw away all cigarettes at home, at work, and in your car. ?Throw away the things that you use when you smoke, such as ashtrays and lighters. ?Clean your car. Empty the ashtray. ?Clean your home, including curtains and carpets. ?What can I do to help me quit smoking? ?Talk with your doctor about taking medicines and seeing a counselor. You are more likely to succeed when you do both. ?If you are pregnant or breastfeeding: ?Talk with your doctor about counseling or other ways to quit smoking. ?Do not take medicine to help you quit smoking unless your doctor tells you to. ?Quit right away ?Quit smoking completely, instead of slowly cutting back on how much you smoke over a period of time. Stopping smoking right away may be more successful than slowly quitting. ?Go to counseling. In-person is best  if this is an option. You are more likely to quit if you go to counseling sessions regularly. ?Take medicine ?You may take medicines to help you quit. Some medicines need a prescription, and some you can buy over-the-counter. Some medicines may contain a drug called nicotine to replace the nicotine in cigarettes. Medicines may: ?Help you stop having the desire to smoke (cravings). ?Help to stop the problems that come when you stop smoking (withdrawal symptoms). ?Your doctor may ask you to use: ?Nicotine patches, gum, or lozenges. ?Nicotine inhalers or sprays. ?Non-nicotine medicine that you take by mouth. ?Find resources ?Find resources and other ways to help you quit smoking and remain smoke-free after you quit. They include: ?Online chats with a Social worker. ?Phone quitlines. ?Careers information officer. ?Support groups or group counseling. ?Text messaging programs. ?Mobile phone apps. Use apps on your mobile phone or tablet that can help you stick to your quit plan. Examples of free services include Quit Guide from the CDC and smokefree.gov ? ?What can I do to make it easier to quit? ? ?Talk to your family and friends. Ask them to support and encourage you. ?Call a phone quitline, such as 1-800-QUIT-NOW, reach out to support groups, or work with a Social worker. ?Ask people who smoke to not smoke around you. ?Avoid places that make you want to smoke, such as: ?Bars. ?Parties. ?Smoke-break areas at work. ?Spend time with people who do not smoke. ?Lower  the stress in your life. Stress can make you want to smoke. Try these things to lower stress: ?Getting regular exercise. ?Doing deep-breathing exercises. ?Doing yoga. ?Meditating. ?What benefits will I see if I quit smoking? ?Over time, you may have: ?A better sense of smell and taste. ?Less coughing and sore throat. ?A slower heart rate. ?Lower blood pressure. ?Clearer skin. ?Better breathing. ?Fewer sick days. ?Summary ?Quitting smoking can be hard, but it is one of  the best things that you can do for your health. ?Do not give up if you cannot quit the first time. Some people need to try many times to quit. ?When you decide to quit smoking, make a plan to help you succeed. ?Quit smoking right away, not slowly over a period of time. ?When you start quitting, get help and support to keep you smoke-free. ?This information is not intended to replace advice given to you by your health care provider. Make sure you discuss any questions you have with your health care provider. ?Document Revised: 02/15/2021 Document Reviewed: 02/15/2021 ?Elsevier Patient Education ? [place]. ?Durvalumab injection ?What is this medication? ?DURVALUMAB (dur VAL ue mab) is a monoclonal antibody. It is used to treat lung cancer. ?This medicine may be used for other purposes; ask your health care provider or pharmacist if you have questions. ?COMMON BRAND NAME(S): IMFINZI ?What should I tell my care team before I take this medication? ?They need to know if you have any of these conditions: ?autoimmune diseases like Crohn's disease, ulcerative colitis, or lupus ?have had or planning to have an allogeneic stem cell transplant (uses someone else's stem cells) ?history of organ transplant ?history of radiation to the chest ?nervous system problems like myasthenia gravis or Guillain-Barre syndrome ?an unusual or allergic reaction to durvalumab, other medicines, foods, dyes, or preservatives ?pregnant or trying to get pregnant ?breast-feeding ?How should I use this medication? ?This medicine is for infusion into a vein. It is given by a health care professional in a hospital or clinic setting. ?A special MedGuide will be given to you before each treatment. Be sure to read this information carefully each time. ?Talk to your pediatrician regarding the use of this medicine in children. Special care may be needed. ?Overdosage: If you think you have taken too much of this medicine contact a poison control  center or emergency room at once. ?NOTE: This medicine is only for you. Do not share this medicine with others. ?What if I miss a dose? ?It is important not to miss your dose. Call your doctor or health care professional if you are unable to keep an appointment. ?What may interact with this medication? ?Interactions have not been studied. ?This list may not describe all possible interactions. Give your health care provider a list of all the medicines, herbs, non-prescription drugs, or dietary supplements you use. Also tell them if you smoke, drink alcohol, or use illegal drugs. Some items may interact with your medicine. ?What should I watch for while using this medication? ?This medication may make you feel generally unwell. Continue your course of treatment even though you feel ill unless your care team tells you to stop. ?You may need blood work done while you are taking this medication. ?Do not become pregnant while taking this medication or for 3 months after stopping it. Women should inform their care team if they wish to become pregnant or think they might be pregnant. There is a potential for serious side effects to an unborn child. Talk  to your care team or pharmacist for more information. Do not breast-feed an infant while taking this medication or for 3 months after stopping it. ?What side effects may I notice from receiving this medication? ?Side effects that you should report to your care team as soon as possible: ?Allergic reactions--skin rash, itching, hives, swelling of the face, lips, tongue, or throat ?Bloody or watery diarrhea ?Dizziness, loss of balance or coordination, confusion or trouble speaking ?Dry cough, shortness of breath or trouble breathing ?Flushing, mostly over the face, neck, and chest, during injection ?High blood sugar (hyperglycemia)--increased thirst or amount of urine, unusual weakness or fatigue, blurry vision ?High thyroid levels (hyperthyroidism)--fast or irregular  heartbeat, weight loss, excessive sweating or sensitivity to heat, tremors or shaking, anxiety, nervousness, irregular menstrual cycle or spotting ?Infection--fever, chills, cough, or sore throat ?Liver injury--ri

## 2021-07-01 NOTE — Progress Notes (Signed)
Oncology Nurse Navigator Documentation ? ? ?  07/01/2021  ? 10:00 AM 05/07/2021  ?  9:00 AM 04/17/2021  ? 10:00 AM 04/15/2021  ?  9:00 AM 01/24/2021  ? 12:00 PM 01/18/2021  ? 10:00 AM 01/16/2021  ?  8:00 AM  ?Oncology Nurse Navigator Flowsheets  ?Abnormal Finding Date       11/05/2020  ?Confirmed Diagnosis Date       12/10/2020  ?Diagnosis Status       Confirmed Diagnosis Complete  ?Planned Course of Treatment    Chemo/Radiation Concurrent  Neo Chemo;Targeted Therapy Neo Chemo;Targeted Therapy  ?Phase of Treatment    Radiation  Targeted Therapy Targeted Therapy  ?Chemotherapy Actual Start Date:      01/24/2021   ?Radiation Actual Start Date:    03/27/2021     ?Targeted Therapy Actual Start Date:      01/24/2021   ?Navigator Follow Up Date:  05/13/2021  04/29/2021 02/06/2021 01/21/2021 01/18/2021  ?Navigator Follow Up Reason:  Follow-up Appointment  Follow-up Appointment Follow-up Appointment Education Appointment Review  ?Navigator Location [facility]  ?Navigator Encounter Type Follow-up Appt;Clinic/MDC Clinic/MDC;Follow-up Appt Lobby Clinic/MDC Other: Telephone Clinic/MDC  ?Telephone      Education;Outgoing Call;Asess Navigation Needs   ?Treatment Initiated Date      01/24/2021   ?Patient Visit Type MedOnc;Follow-up Follow-up;MedOnc  Follow-up;MedOnc Other Other MedOnc  ?Treatment Phase Other Treatment Treatment Treatment Treatment Pre-Tx/Tx Discussion Pre-Tx/Tx Discussion  ?Engineer, maintenance of Care;Education Education  ?Education Other Other  Other  Other Newly Diagnosed Cancer Education;Other  ?Interventions Education;Psycho-Social Support/I spoke with Brandon Rubio today. He is doing well.  He is going to have tx change with new medication. I help to explained tx plan and added information to his AVS. I noticed patient has lost weight during tx  and asked if he would like a dietitian to see him and he declined at this time.  Psycho-Social Support;Education Psycho-Social Support Psycho-Social Support;Education Coordination of Care Coordination of Care;Education;Psycho-Social Support Education;None Required  ?Acuity Level 2-Minimal Needs (1-2 Barriers Identified) Level 2-Minimal Needs (1-2 Barriers Identified)  Level 2-Minimal Needs (1-2 Barriers Identified) Level 2-Minimal Needs (1-2 Barriers Identified) Level 2-Minimal Needs (1-2 Barriers Identified) Level 2-Minimal Needs (1-2 Barriers Identified)  ?Coordination of Care     Pathology    ?Education Method Verbal;Other Verbal  Verbal  Verbal Verbal;Written  ?Time Spent with Patient 30 30 15 30 30 15 30   ?  ?

## 2021-07-08 ENCOUNTER — Other Ambulatory Visit: Payer: Self-pay | Admitting: Internal Medicine

## 2021-07-08 ENCOUNTER — Inpatient Hospital Stay: Payer: Medicare Other

## 2021-07-08 ENCOUNTER — Other Ambulatory Visit: Payer: Self-pay

## 2021-07-08 ENCOUNTER — Inpatient Hospital Stay: Payer: Medicare Other | Attending: Internal Medicine

## 2021-07-08 VITALS — BP 104/68 | HR 106 | Temp 97.9°F | Resp 17 | Wt 186.5 lb

## 2021-07-08 DIAGNOSIS — R5382 Chronic fatigue, unspecified: Secondary | ICD-10-CM

## 2021-07-08 DIAGNOSIS — R110 Nausea: Secondary | ICD-10-CM

## 2021-07-08 DIAGNOSIS — C3491 Malignant neoplasm of unspecified part of right bronchus or lung: Secondary | ICD-10-CM

## 2021-07-08 DIAGNOSIS — C3431 Malignant neoplasm of lower lobe, right bronchus or lung: Secondary | ICD-10-CM | POA: Insufficient documentation

## 2021-07-08 DIAGNOSIS — Z79899 Other long term (current) drug therapy: Secondary | ICD-10-CM | POA: Diagnosis not present

## 2021-07-08 DIAGNOSIS — Z5112 Encounter for antineoplastic immunotherapy: Secondary | ICD-10-CM | POA: Diagnosis not present

## 2021-07-08 LAB — CBC WITH DIFFERENTIAL (CANCER CENTER ONLY)
Abs Immature Granulocytes: 0.03 10*3/uL (ref 0.00–0.07)
Basophils Absolute: 0.1 10*3/uL (ref 0.0–0.1)
Basophils Relative: 1 %
Eosinophils Absolute: 0.2 10*3/uL (ref 0.0–0.5)
Eosinophils Relative: 2 %
HCT: 36.5 % — ABNORMAL LOW (ref 39.0–52.0)
Hemoglobin: 12.7 g/dL — ABNORMAL LOW (ref 13.0–17.0)
Immature Granulocytes: 0 %
Lymphocytes Relative: 7 %
Lymphs Abs: 0.7 10*3/uL (ref 0.7–4.0)
MCH: 31.1 pg (ref 26.0–34.0)
MCHC: 34.8 g/dL (ref 30.0–36.0)
MCV: 89.2 fL (ref 80.0–100.0)
Monocytes Absolute: 0.5 10*3/uL (ref 0.1–1.0)
Monocytes Relative: 5 %
Neutro Abs: 8.3 10*3/uL — ABNORMAL HIGH (ref 1.7–7.7)
Neutrophils Relative %: 85 %
Platelet Count: 203 10*3/uL (ref 150–400)
RBC: 4.09 MIL/uL — ABNORMAL LOW (ref 4.22–5.81)
RDW: 12.4 % (ref 11.5–15.5)
WBC Count: 9.7 10*3/uL (ref 4.0–10.5)
nRBC: 0 % (ref 0.0–0.2)

## 2021-07-08 LAB — TSH: TSH: 1.49 u[IU]/mL (ref 0.350–4.500)

## 2021-07-08 LAB — CMP (CANCER CENTER ONLY)
ALT: 11 U/L (ref 0–44)
AST: 14 U/L — ABNORMAL LOW (ref 15–41)
Albumin: 4 g/dL (ref 3.5–5.0)
Alkaline Phosphatase: 60 U/L (ref 38–126)
Anion gap: 8 (ref 5–15)
BUN: 12 mg/dL (ref 8–23)
CO2: 26 mmol/L (ref 22–32)
Calcium: 9.6 mg/dL (ref 8.9–10.3)
Chloride: 101 mmol/L (ref 98–111)
Creatinine: 0.82 mg/dL (ref 0.61–1.24)
GFR, Estimated: >60 mL/min (ref >60)
Glucose, Bld: 195 mg/dL — ABNORMAL HIGH (ref 70–99)
Potassium: 3.9 mmol/L (ref 3.5–5.1)
Sodium: 135 mmol/L (ref 135–145)
Total Bilirubin: 0.4 mg/dL (ref 0.3–1.2)
Total Protein: 7.1 g/dL (ref 6.5–8.1)

## 2021-07-08 MED ORDER — SODIUM CHLORIDE 0.9 % IV SOLN
1500.0000 mg | Freq: Once | INTRAVENOUS | Status: AC
  Administered 2021-07-08: 1500 mg via INTRAVENOUS
  Filled 2021-07-08: qty 30

## 2021-07-08 MED ORDER — SODIUM CHLORIDE 0.9 % IV SOLN: Freq: Once | INTRAVENOUS | Status: AC

## 2021-07-08 NOTE — Progress Notes (Signed)
Pt states his Prednisone dose is currently 7.5 mg/day. ? ?Kennith Center, Pharm.D., CPP ?07/08/2021@12 :10 PM ? ? ?

## 2021-07-08 NOTE — Progress Notes (Signed)
Per Julien Nordmann, ok to treat with HR 106. ?

## 2021-07-08 NOTE — Patient Instructions (Signed)
[facility]  Discharge Instructions: ?Thank you for choosing [facility] to provide your oncology and hematology care.  ? ?If you have a lab appointment with the [facility], please go directly to the [facility] and check in at the registration area. ?  ?Wear comfortable clothing and clothing appropriate for easy access to any Portacath or PICC line.  ? ?We strive to give you quality time with your provider. You may need to reschedule your appointment if you arrive late (15 or more minutes).  Arriving late affects you and other patients whose appointments are after yours.  Also, if you miss three or more appointments without notifying the office, you may be dismissed from the clinic at the provider?s discretion.    ?  ?For prescription refill requests, have your pharmacy contact our office and allow 72 hours for refills to be completed.   ? ?Today you received the following chemotherapy and/or immunotherapy agents: Imfinzi.    ?  ?To help prevent nausea and vomiting after your treatment, we encourage you to take your nausea medication as directed. ? ?BELOW ARE SYMPTOMS THAT SHOULD BE REPORTED IMMEDIATELY: ?*FEVER GREATER THAN 100.4 F (38 ?C) OR HIGHER ?*CHILLS OR SWEATING ?*NAUSEA AND VOMITING THAT IS NOT CONTROLLED WITH YOUR NAUSEA MEDICATION ?*UNUSUAL SHORTNESS OF BREATH ?*UNUSUAL BRUISING OR BLEEDING ?*URINARY PROBLEMS (pain or burning when urinating, or frequent urination) ?*BOWEL PROBLEMS (unusual diarrhea, constipation, pain near the anus) ?TENDERNESS IN MOUTH AND THROAT WITH OR WITHOUT PRESENCE OF ULCERS (sore throat, sores in mouth, or a toothache) ?UNUSUAL RASH, SWELLING OR PAIN  ?UNUSUAL VAGINAL DISCHARGE OR ITCHING  ? ?Items with * indicate a potential emergency and should be followed up as soon as possible or go to the Emergency Department if any problems should occur. ? ?Please show the CHEMOTHERAPY ALERT CARD or IMMUNOTHERAPY ALERT CARD at check-in to  the Emergency Department and triage nurse. ? ?Should you have questions after your visit or need to cancel or reschedule your appointment, please contact [facility]  Dept: [phone]  and follow the prompts.  Office hours are 8:00 a.m. to 4:30 p.m. Monday - Friday. Please note that voicemails left after 4:00 p.m. may not be returned until the following business day.  We are closed weekends and major holidays. You have access to a nurse at all times for urgent questions. Please call the main number to the clinic Dept: [phone] and follow the prompts. ? ? ?For any non-urgent questions, you may also contact your provider using MyChart. We now offer e-Visits for anyone 51 and older to request care online for non-urgent symptoms. For details visit [email or web address]. ?  ?Also download the MyChart app! Go to the app store, search "MyChart", open the app, select [facility], and log in with your MyChart username and password. ? ?Due to Covid, a mask is required upon entering the hospital/clinic. If you do not have a mask, one will be given to you upon arrival. For doctor visits, patients may have 1 support person aged 1 or older with them. For treatment visits, patients cannot have anyone with them due to current Covid guidelines and our immunocompromised population.  ? ?Durvalumab injection ?What is this medication? ?DURVALUMAB (dur VAL ue mab) is a monoclonal antibody. It is used to treat lung cancer. ?This medicine may be used for other purposes; ask your health care provider or pharmacist if you have questions. ?COMMON BRAND NAME(S): IMFINZI ?What should I tell  my care team before I take this medication? ?They need to know if you have any of these conditions: ?autoimmune diseases like Crohn's disease, ulcerative colitis, or lupus ?have had or planning to have an allogeneic stem cell transplant (uses someone else's stem cells) ?history of organ transplant ?history of radiation  to the chest ?nervous system problems like myasthenia gravis or Guillain-Barre syndrome ?an unusual or allergic reaction to durvalumab, other medicines, foods, dyes, or preservatives ?pregnant or trying to get pregnant ?breast-feeding ?How should I use this medication? ?This medicine is for infusion into a vein. It is given by a health care professional in a hospital or clinic setting. ?A special MedGuide will be given to you before each treatment. Be sure to read this information carefully each time. ?Talk to your pediatrician regarding the use of this medicine in children. Special care may be needed. ?Overdosage: If you think you have taken too much of this medicine contact a poison control center or emergency room at once. ?NOTE: This medicine is only for you. Do not share this medicine with others. ?What if I miss a dose? ?It is important not to miss your dose. Call your doctor or health care professional if you are unable to keep an appointment. ?What may interact with this medication? ?Interactions have not been studied. ?This list may not describe all possible interactions. Give your health care provider a list of all the medicines, herbs, non-prescription drugs, or dietary supplements you use. Also tell them if you smoke, drink alcohol, or use illegal drugs. Some items may interact with your medicine. ?What should I watch for while using this medication? ?This medication may make you feel generally unwell. Continue your course of treatment even though you feel ill unless your care team tells you to stop. ?You may need blood work done while you are taking this medication. ?Do not become pregnant while taking this medication or for 3 months after stopping it. Women should inform their care team if they wish to become pregnant or think they might be pregnant. There is a potential for serious side effects to an unborn child. Talk to your care team or pharmacist for more information. Do not breast-feed an infant  while taking this medication or for 3 months after stopping it. ?What side effects may I notice from receiving this medication? ?Side effects that you should report to your care team as soon as possible: ?Allergic reactions--skin rash, itching, hives, swelling of the face, lips, tongue, or throat ?Bloody or watery diarrhea ?Dizziness, loss of balance or coordination, confusion or trouble speaking ?Dry cough, shortness of breath or trouble breathing ?Flushing, mostly over the face, neck, and chest, during injection ?High blood sugar (hyperglycemia)--increased thirst or amount of urine, unusual weakness or fatigue, blurry vision ?High thyroid levels (hyperthyroidism)--fast or irregular heartbeat, weight loss, excessive sweating or sensitivity to heat, tremors or shaking, anxiety, nervousness, irregular menstrual cycle or spotting ?Infection--fever, chills, cough, or sore throat ?Liver injury--right upper belly pain, loss of appetite, nausea, light-colored stool, dark yellow or brown urine, yellowing skin or eyes, unusual weakness or fatigue ?Low adrenal gland function--nausea, vomiting, loss of appetite, unusual weakness or fatigue, dizziness, low blood pressure ?Low thyroid levels (hypothyroidism)--unusual weakness or fatigue, increased sensitivity to cold, constipation, hair loss, dry skin, weight gain, feelings of depression ?Pancreatitis--severe stomach pain that spreads to your back or gets worse after eating or when touched, fever, nausea, vomiting ?Rash, fever, and swollen lymph nodes ?Redness, blistering, peeling or loosening of the skin, including  inside the mouth ?Wheezing--trouble breathing with loud or whistling sounds ?Side effects that usually do not require medical attention (report these to your care team if they continue or are bothersome): ?Fatigue ?Hair loss ?This list may not describe all possible side effects. Call your doctor for medical advice about side effects. You may report side effects to  FDA at 1-800-FDA-1088. ?Where should I keep my medication? ?This medication is given in a hospital or clinic. It will not be stored at home. ?NOTE: This sheet is a summary. It may not cover all possible

## 2021-07-09 ENCOUNTER — Telehealth: Payer: Self-pay | Admitting: *Deleted

## 2021-07-09 NOTE — Telephone Encounter (Signed)
Called pt to see how he did with his recent treatment.  He reports doing well & denies side effects other that feeling tired & weak.  He has had some hospitalizations.  Discussed getting some exercise slowly to help boost energy & help with muscle tone & build up to what is tolerated.  Discussed IO rare side effects & reminded to call with any concerns.  ?

## 2021-07-09 NOTE — Telephone Encounter (Signed)
-----   Message from Rafael Bihari, RN sent at 07/08/2021  2:17 PM EDT ----- ?Regarding: Dr Julien Nordmann pt, first time Imfinzi. ?Pt came in 07/08/21 for first time Infinzi infusion. Tolerated well. Needs call back.  ? ?

## 2021-07-11 ENCOUNTER — Telehealth: Payer: Self-pay

## 2021-07-11 NOTE — Progress Notes (Signed)
? ? ?Chronic Care Management ?Pharmacy Assistant  ? ?Name: Brandon Rubio.  MRN: [id number] DOB: 04/28/1950 ? ? ?Reason for Encounter: Disease State-Adherence ?  ? ?Recent office visits:  ?None since last coordination call ? ?Recent consult visits:  ?06/09/21 Brandon Bears, MD-Oncology (Chronic fatigue) Ct chest scan 06/27/21; Med changes: Imfinzi 1500 Mg IV every 4 weeks for a total of 1 year. ? ?Hospital visits:  ?None since last coordination call ? ?Medications: ?Outpatient Encounter Medications as of 07/11/2021  ?Medication Sig Note  ? aspirin EC 81 MG tablet Take 81 mg by mouth in the morning. Swallow whole.   ? bisacodyl (DULCOLAX) 5 MG EC tablet Take 5 mg by mouth daily as needed for moderate constipation. (Patient not taking: Reported on 07/01/2021)   ? HYDROcodone-acetaminophen (NORCO) 5-325 MG tablet Take 1 tablet by mouth every 6 (six) hours as needed for moderate pain.   ? metFORMIN (GLUCOPHAGE) 1000 MG tablet TAKE 1 TABLET(1000 MG) BY MOUTH TWICE DAILY WITH A MEAL   ? midodrine (PROAMATINE) 10 MG tablet Take 1 tablet (10 mg total) by mouth 3 (three) times daily with meals. (Patient not taking: Reported on 07/01/2021) 07/01/2021: Pt does not want to take anymore b/c he has nausea and read that is a side effect of med. Message send to prescriber.  ? Multiple Vitamins-Minerals (CENTRUM SILVER 50+MEN) TABS Take 1 tablet by mouth daily with breakfast.   ? omeprazole (PRILOSEC) 20 MG capsule TAKE 1 CAPSULE(20 MG) BY MOUTH TWICE DAILY BEFORE A MEAL (Patient taking differently: Take 20 mg by mouth 2 (two) times daily before a meal.)   ? predniSONE (DELTASONE) 5 MG tablet Take 1.5 tablets (7.5 mg total) by mouth daily with breakfast.   ? prochlorperazine (COMPAZINE) 10 MG tablet TAKE 1 TABLET(10 MG) BY MOUTH EVERY 6 HOURS AS NEEDED FOR NAUSEA OR VOMITING   ? simvastatin (ZOCOR) 40 MG tablet Take 1 tablet (40 mg total) by mouth daily at 6 PM.   ? ?No facility-administered encounter medications on file as of  07/11/2021.  ? ?Contacted Brandon Rubio. for General Review Call ? ? ?Chart Review: ? ?Have there been any documented new, changed, or discontinued medications since last visit? Yes (If yes, include name, dose, frequency, date) Imfinzi 1500 Mg IV every 4 weeks.  First dose on Jul 08, 2021. ?Has there been any documented recent hospitalizations or ED visits since last visit with Clinical Pharmacist? No ?Brief Summary (including medication and/or Diagnosis changes): ? ? ?Adherence Review: ? ?Does the Clinical Pharmacist Assistant have access to adherence rates? Yes ?Adherence rates for STAR metric medications (List medication(s)/day supply/ last 2 fill dates). ?Adherence rates for medications indicated for disease state being reviewed (List medication(s)/day supply/ last 2 fill dates). ?Does the patient have >5 day gap between last estimated fill dates for any of the above medications or other medication gaps? Yes, simvastatin 40 mg ?Reason for medication gaps. ? ? ?Disease State Questions: ? ?Able to connect with Patient? Yes ?Did patient have any problems with their health recently? No ?Note problems and Concerns: ?Have you had any admissions or emergency room visits or worsening of your condition(s) since last visit? No ?Details of ED visit, hospital visit and/or worsening condition(s): ?Have you had any visits with new specialists or providers since your last visit? No ?Explain: ?Have you had any new health care problem(s) since your last visit? No ?New problem(s) reported: ?Have you run out of any of your medications since you last spoke with clinical  pharmacist? No ?What caused you to run out of your medications? ?Are there any medications you are not taking as prescribed? No ?What kept you from taking your medications as prescribed? ?Are you having any issues or side effects with your medications? No ?Note of issues or side effects: ?Do you have any other health concerns or questions you want to discuss  with your Clinical Pharmacist before your next visit? Yes ?Note additional concerns and questions from Patient.Patient would like to talk with pharmacist about medications. He was suppose to have a appt with clinical pharmacist on 06/26/21 but appt was cancelled, not sure why. He has appt coming up in Sept but would like a phone call before then if possible. ?Are there any health concerns that you feel we can do a better job addressing? No ?Note Patient's response. ?Are you having any problems with any of the following since the last visit: (select all that apply) ? None ? Details: ?12. Any falls since last visit? No ? Details: ?13. Any increased or uncontrolled pain since last visit? No ? Details: ?14. Next visit Type: telephone ?      Visit with: Clinical Pharmacist ?       Date:11/08/21 ?       Time: 1:45pm ? ?15. Additional Details? No ?   ?Care Gaps: ?Colonoscopy-12/31/20 ?Diabetic Foot Exam-06/14/20 ?Ophthalmology-06/28/20 ?Dexa Scan - NA ?Annual Well Visit -  ?Micro albumin-06/14/20 ?Hemoglobin A1c- 05/27/21 ? ?Star Rating Drugs: ?Metformin 1000 mg-last fill 06/26/21 90 ds ?Simvastatin 40 mg-last fill 12/23/20 90 ds ? ?Brandon Rubio ?Clinical Pharmacist Assistant ?[phone]  ?

## 2021-07-13 ENCOUNTER — Other Ambulatory Visit: Payer: Self-pay | Admitting: Internal Medicine

## 2021-07-13 DIAGNOSIS — R110 Nausea: Secondary | ICD-10-CM

## 2021-07-13 DIAGNOSIS — C3491 Malignant neoplasm of unspecified part of right bronchus or lung: Secondary | ICD-10-CM

## 2021-07-23 ENCOUNTER — Ambulatory Visit (INDEPENDENT_AMBULATORY_CARE_PROVIDER_SITE_OTHER): Payer: Medicare Other

## 2021-07-23 DIAGNOSIS — E118 Type 2 diabetes mellitus with unspecified complications: Secondary | ICD-10-CM

## 2021-07-23 DIAGNOSIS — F17200 Nicotine dependence, unspecified, uncomplicated: Secondary | ICD-10-CM

## 2021-07-23 DIAGNOSIS — I10 Essential (primary) hypertension: Secondary | ICD-10-CM

## 2021-07-23 DIAGNOSIS — I2510 Atherosclerotic heart disease of native coronary artery without angina pectoris: Secondary | ICD-10-CM

## 2021-07-23 DIAGNOSIS — E1169 Type 2 diabetes mellitus with other specified complication: Secondary | ICD-10-CM

## 2021-07-23 NOTE — Patient Instructions (Signed)
Visit Information ? ?Following are the goals we discussed today:  ? ?Track and Monitor My Blood Sugar  ? ?Timeframe:  Long-Range Goal ?Priority:  High ?Start Date:        07/19/20                     ?Expected End Date:    02/06/22                 ? ?Follow Up Date 10/2021 ?  ?-Continue metformin as prescribed ?-Reduce sugar and carbs in diet ?-Increase exercise to goal of 150 min/week ?  ?Why is this important?   ?Checking your blood sugar at home helps to keep it from getting very high or very low.  ?Writing the results in a diary or log helps the doctor know how to care for you.  ?Your blood sugar log should have the time, date and the results.  ?Also, write down the amount of insulin or other medicine that you take.  ?Other information, like what you ate, exercise done and how you were feeling, will also be helpful.   ? ?Plan: Telephone follow up appointment with care management team member scheduled for:  3 months ?The patient has been provided with contact information for the care management team and has been advised to call with any health related questions or concerns.  ? ?Tomasa Blase, PharmD ?Clinical Pharmacist, [facility]  ? ?Please call the care guide team at [phone] if you need to cancel or reschedule your appointment.  ? ?The patient verbalized understanding of instructions, educational materials, and care plan provided today and declined offer to receive copy of patient instructions, educational materials, and care plan.  ? ?

## 2021-07-23 NOTE — Progress Notes (Signed)
? ?Chronic Care Management ?Pharmacy Note ? ?07/23/2021 ?Name:  Brandon Rubio. MRN:  [id number] DOB:  Aug 15, 1950 ? ? ?Summary: ?-Pt reports that he feels he has been doing well in regards to his recent medication changes, since switching to prednisone, no longer having issues with his stomach / sweating as he was with hydrocortisone ?-BG controlled 120-140 on average taking metformin 1071m BID  ?-No longer taking metoprolol, BP with most recent office visits since stopping has been well controlled ?-Notes that biggest issue at this time is ongoing leg weakness / lower back pain - previously prescribed hydrocodone for, but feels the 522mdose may not be strong enough to manage ?-Pt reports that pain typically occurs after he starts walking (averages about 100 yards before feeling he needs to stop and take a break) ? ?Recommendations/Changes made from today's visit: ?-Follow up appointment was made with PCP to discuss ongoing back pain and options regarding management for pain, advised for patient to continue current dose of hydrocodone, recommended he not self increase dose and discuss with PCP before making any changes  ? ? ?Subjective: ?Brandon Pierreis an [age].o. year old male who is a primary patient of CrHoyt KochMD.  The CCM team was consulted for assistance with disease management and care coordination needs.   ? ?Engaged with patient by telephone for follow up visit in response to provider referral for pharmacy case management and/or care coordination services.  ? ?Consent to Services:  ?The patient was given information about Chronic Care Management services, agreed to services, and gave verbal consent prior to initiation of services.  Please see initial visit note for detailed documentation.  ? ?Patient Care Team: ?CrHoyt KochMD as PCP - General (Internal Medicine) ?JaMilus BanisterMD as Attending Physician (Gastroenterology) ?HeValrie HartRN as Oncology Nurse  Navigator (Oncology) ?SzTomasa BlaseRPV [facility] Pharmacist (Pharmacist) ?MoCurt BearsMD as Consulting Physician (Oncology) ?GhPhilemon KingdomMD as Consulting Physician (Internal Medicine) ?HeMonna FamMD as Consulting Physician (Ophthalmology) ? ?Recent office visits: ?05/31/2021 - Dr. CrSharlet [place] hospital follow up  - no changes to medications  ? ?Recent consult visits: ?07/01/2021 - Dr. MoJulien Nordmann Oncology - Imfinzi 1500 Mg IV every 4 weeks for a total of 1 year. ?05/30/2021 - Dr. GhCruzita Lederer Endocrinology - adrenal insufficiency - hydrocortisone 1554mn AM and 64m67m PM - f/u in 4 months  ?05/28/2021 - Dr. MohaJulien Nordmannncology - continue current treatment - consider consolidation treatment with immunotherapy / Imfinzi 1500mg27mq4 weeks  ?05/21/2021 - Dr. GhergCruzita Ledererdocrinology - continue hydrocortisone, plan to reduce and taper over the next 2 months  ?05/07/2021 - Dr. MohamJulien Nordmanncology  - continue delay on chemotherapy until improvement in his platelets - f/u in 1 week ?05/02/2021 - Cassandra Heilingoetter PA-C - Oncology - decreased fluid intake - IV fluids ordered , EKG ordered as well ?04/29/2021 - Dr. MohamJulien Nordmanncology - evaluation prior to starting cycle #5 of his treatment - f/u in 2 weeks  - The patient is currently undergoing a course of concurrent chemoradiation weekly carboplatin for AUC of 2 and paclitaxel 45 Mg/M2.  Status post 4 cycles. ? 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 ?Objective: ? ?Lab Results  ?Component Value Date  ? CREATININE 0.82 07/08/2021  ?  BUN 12 07/08/2021  ? GFR 77.05 10/02/2020  ? GFRNONAA >60 07/08/2021  ? GFRAA  12/14/2007  ?  >60        ?The eGFR has been calculated ?using the MDRD equation. ?This calculation has not been ?validated in all clinical  ? NA 135 07/08/2021  ? K 3.9 07/08/2021  ? CALCIUM 9.6 07/08/2021  ? CO2 26 07/08/2021  ? GLUCOSE 195 (H) 07/08/2021  ? ? ?Lab Results  ?Component Value Date/Time  ? HGBA1C 6.1 (H) 05/27/2021 04:08 AM  ? HGBA1C 5.9 (H) 05/11/2021 05:27 AM  ? GFR 77.05 10/02/2020 08:26 AM  ? GFR 79.15 06/14/2020 10:16 AM  ? MICROALBUR 1.4 06/14/2020 10:16 AM  ? MICROALBUR 0.8 08/11/2016 09:55 AM  ?  ?Last diabetic Eye exam:  ?Lab Results  ?Component Value Date/Time  ? HMDIABEYEEXA No Retinopathy 06/28/2020 10:45 AM  ?  ?Last diabetic Foot exam: No results found for: HMDIABFOOTEX  ? ?Lab Results  ?Component Value Date  ? CHOL 122 05/24/2021  ? HDL 35 (L) 05/24/2021  ? L[place]55 05/24/2021  ? LDLDIRECT 62.0 10/02/2020  ? TRIG 162 (H) 05/24/2021  ? CHOLHDL 3.5 05/24/2021  ? ? ? ?  Latest Ref Rng & Units 07/08/2021  ? 11:04 AM 06/27/2021  ?  8:24 AM 05/28/2021  ?  8:55 AM  ?Hepatic Function  ?Total Protein 6.5 - 8.1 g/dL 7.1   6.7   6.5    ?Albumin 3.5 - 5.0 g/dL 4.0   3.9   3.8    ?AST 15 - 41 U/L _0 ?ALT 0 - 44 U/L _1 ?Alk Phosphatase 38 - 126 U/L 60   51   42    ?Total Bilirubin 0.3 - 1.2 mg/dL 0.4   0.3   0.4    ? ? ?Lab Results  ?Component Value Date/Time  ? TSH 1.490 07/08/2021 11:05 AM  ? TSH 0.772 05/24/2021 04:08 PM  ? TSH 1.22 06/22/2014 08:10 AM  ? TSH 1.14 08/24/2012 10:03 AM  ? ? ? ?  Latest Ref Rng & Units 07/08/2021  ? 11:04 AM 06/27/2021  ?  8:24 AM 05/28/2021  ?  8:55 AM  ?CBC  ?WBC 4.0 - 10.5 K/uL 9.7   8.7   4.7    ?Hemoglobin 13.0 - 17.0 g/dL 12.7   11.2   10.6    ?Hematocrit 39.0 - 52.0 % 36.5   32.2   30.4    ?Platelets 150 - 400 K/uL 203   182   219    ? ? ?No results found for: VD25OH ? ?Clinical ASCVD: Yes  ?The ASCVD Risk score (Arnett DK, et al., 2019) failed to calculate for  the following reasons: ?  The patient has a prior MI or stroke diagnosis   ? ? ?  06/26/2021  ? 11:38 AM 06/14/2020  ?  9:40 AM 01/27/2019  ? 10:00 AM  ?Depression screen PHQ 2/9  ?Decreased Interest 0 0 0  ?Down, Depressed, Hopeless 0 0 0  ?PHQ - 2 Score 0 0 0  ?  ? ?Social History  ? ?Tobacco Use  ?Smoking Status Every Day  ? Packs/day: 0.25  ? Years: 58.00  ?  Pack years: 14.50  ? Types: Cigarettes  ?Smokeless Tobacco Never  ?Tobacco Comments  ? 2+ packs smoked daily [facility] 12/05/20  ? ?BP Readings from Last 3 Encounters:  ?07/08/21 104/68  ?07/01/21 127/68  ?05/31/21 118/70  ? ?Pulse Readings from Last 3 Encounters:  ?07/08/21 (!) 106  ?07/01/21 (!) 110  ?05/31/21 (!) 102  ? ?Wt Readings from Last 3 Encounters:  ?07/08/21 186 lb 8 oz (84.6 kg)  ?07/01/21 188 lb (85.3 kg)  ?05/31/21 200 lb (90.7 kg)  ? ?BMI Readings from Last 3 Encounters:  ?07/08/21 25.29 kg/m?  ?07/01/21 25.50 kg/m?  ?05/31/21 27.12 kg/m?  ? ? ?Assessment/Interventions: Review of patient past medical history, allergies, medications, health status, including review of consultants reports, laboratory and other test data, was performed as part of comprehensive evaluation and provision of chronic care management services.  ? ?SDOH:  (Social Determinants of Health) assessments and interventions performed: Yes ? ?SDOH Screenings  ? ?Alcohol Screen: Low Risk   ? Last Alcohol Screening Score (AUDIT): 0  ?Depression (PHQ2-9): Low Risk   ? PHQ-2 Score: 0  ?Financial Resource Strain: Low Risk   ? Difficulty of Paying Living Expenses: Not hard at all  ?Food Insecurity: No Food Insecurity  ? Worried About Charity fundraiser in the Last Year: Never true  ? Ran Out of Food in the Last Year: Never true  ?Housing: Low Risk   ? Last Housing Risk Score: 0  ?Physical Activity: Sufficiently Active  ? Days of Exercise per Week: 3 days  ? Minutes of Exercise per Session: 60 min  ?Social Connections: Socially Isolated  ? Frequency of Communication with Friends and Family:  More than three times a week  ? Frequency of Social Gatherings with Friends and Family: Three times a week  ? Attends Religious Services: Never  ? Active Member of Clubs or Organizations: No  ? Attends Club

## 2021-07-24 ENCOUNTER — Other Ambulatory Visit: Payer: Self-pay | Admitting: Internal Medicine

## 2021-07-30 ENCOUNTER — Ambulatory Visit: Payer: Medicare Other | Admitting: Internal Medicine

## 2021-08-01 ENCOUNTER — Encounter: Payer: Self-pay | Admitting: Internal Medicine

## 2021-08-01 ENCOUNTER — Ambulatory Visit (INDEPENDENT_AMBULATORY_CARE_PROVIDER_SITE_OTHER): Payer: Medicare Other

## 2021-08-01 ENCOUNTER — Ambulatory Visit (INDEPENDENT_AMBULATORY_CARE_PROVIDER_SITE_OTHER): Payer: Medicare Other | Admitting: Internal Medicine

## 2021-08-01 VITALS — BP 130/80 | HR 94 | Resp 18 | Ht 72.0 in | Wt 183.0 lb

## 2021-08-01 DIAGNOSIS — M545 Low back pain, unspecified: Secondary | ICD-10-CM

## 2021-08-01 DIAGNOSIS — G8929 Other chronic pain: Secondary | ICD-10-CM | POA: Diagnosis not present

## 2021-08-01 DIAGNOSIS — C3491 Malignant neoplasm of unspecified part of right bronchus or lung: Secondary | ICD-10-CM | POA: Diagnosis not present

## 2021-08-01 DIAGNOSIS — I2510 Atherosclerotic heart disease of native coronary artery without angina pectoris: Secondary | ICD-10-CM | POA: Diagnosis not present

## 2021-08-01 IMAGING — DX DG LUMBAR SPINE COMPLETE 4+V
5 series · 5 of 5 positions shown · non-contrast
Comparison: CT abdomen pelvis [DATE]

CLINICAL DATA: Back pain.  History of lung cancer.

EXAM:
LUMBAR SPINE - COMPLETE 4+ VIEW

[l-spine ap]
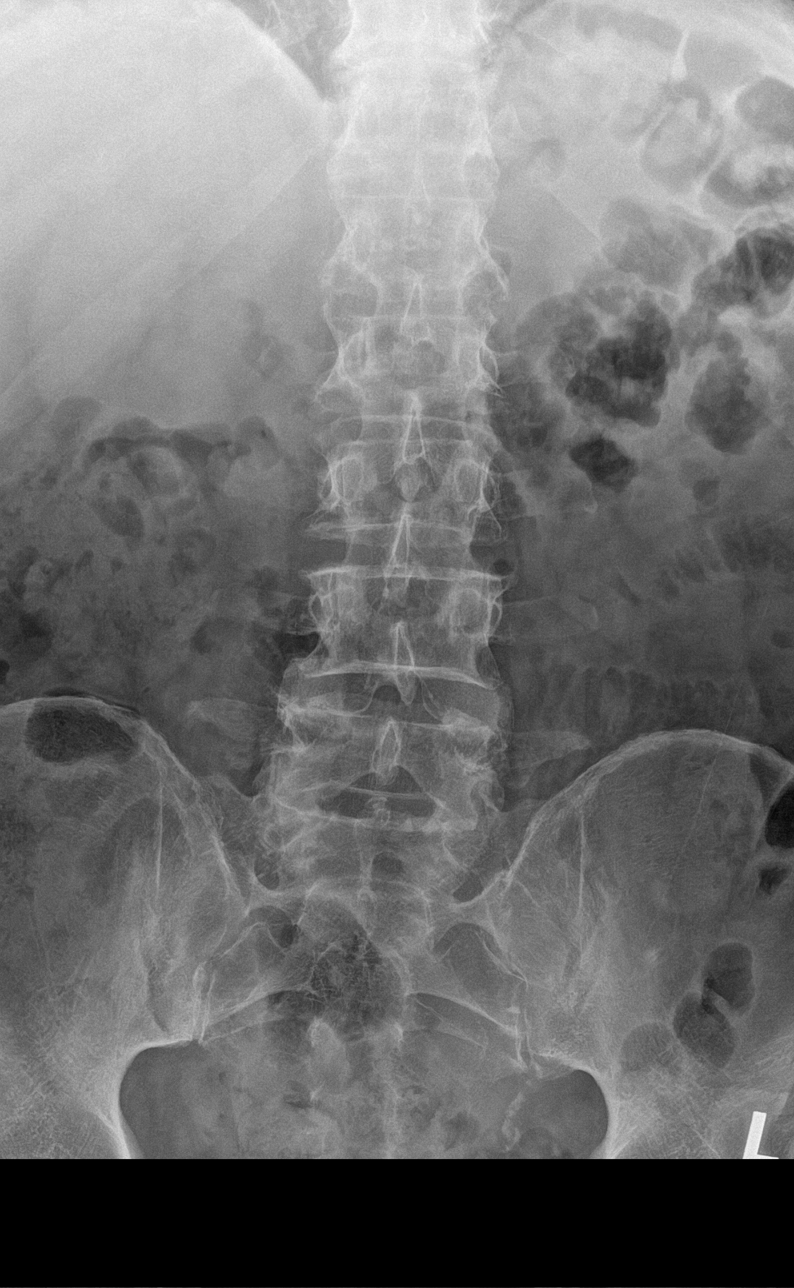

[l-spine obl (1 of 3)]
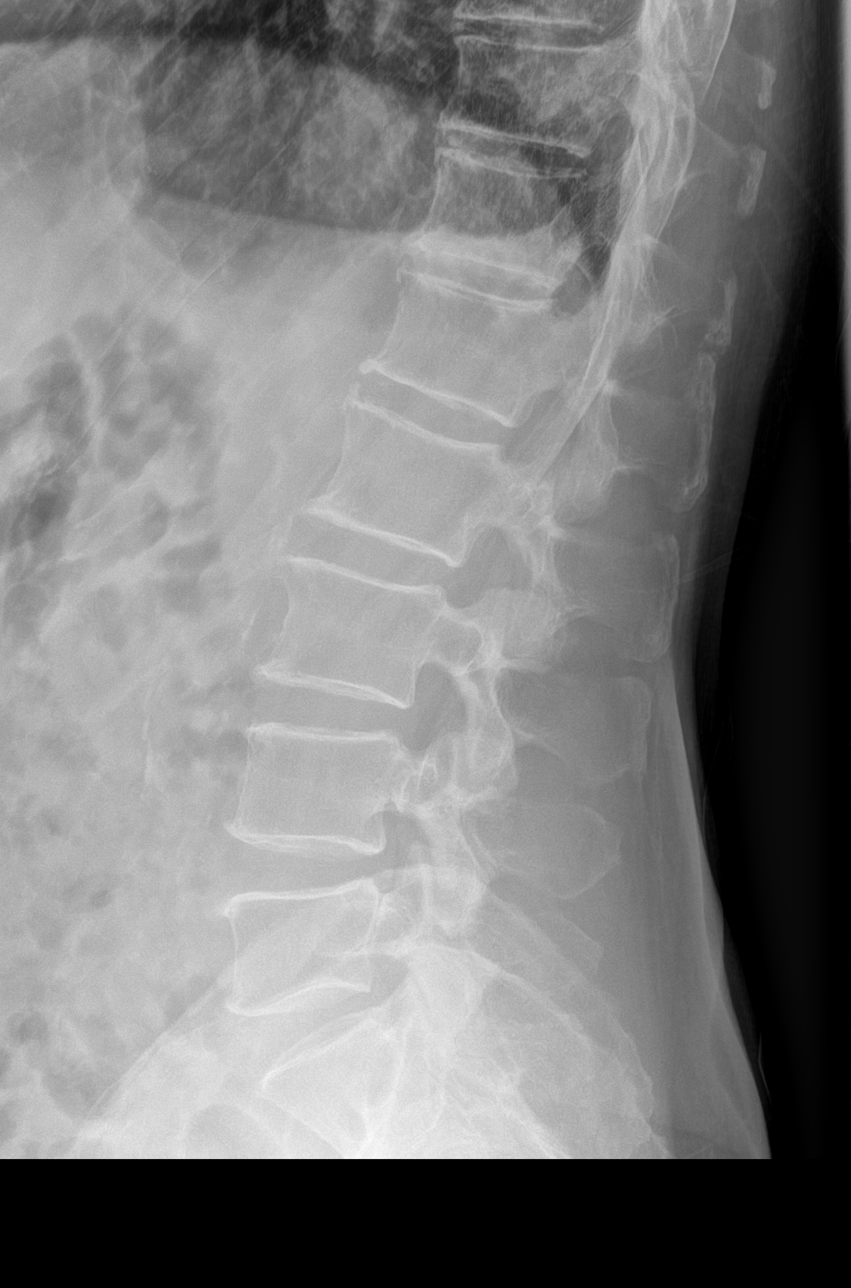

[l-spine obl (2 of 3)]
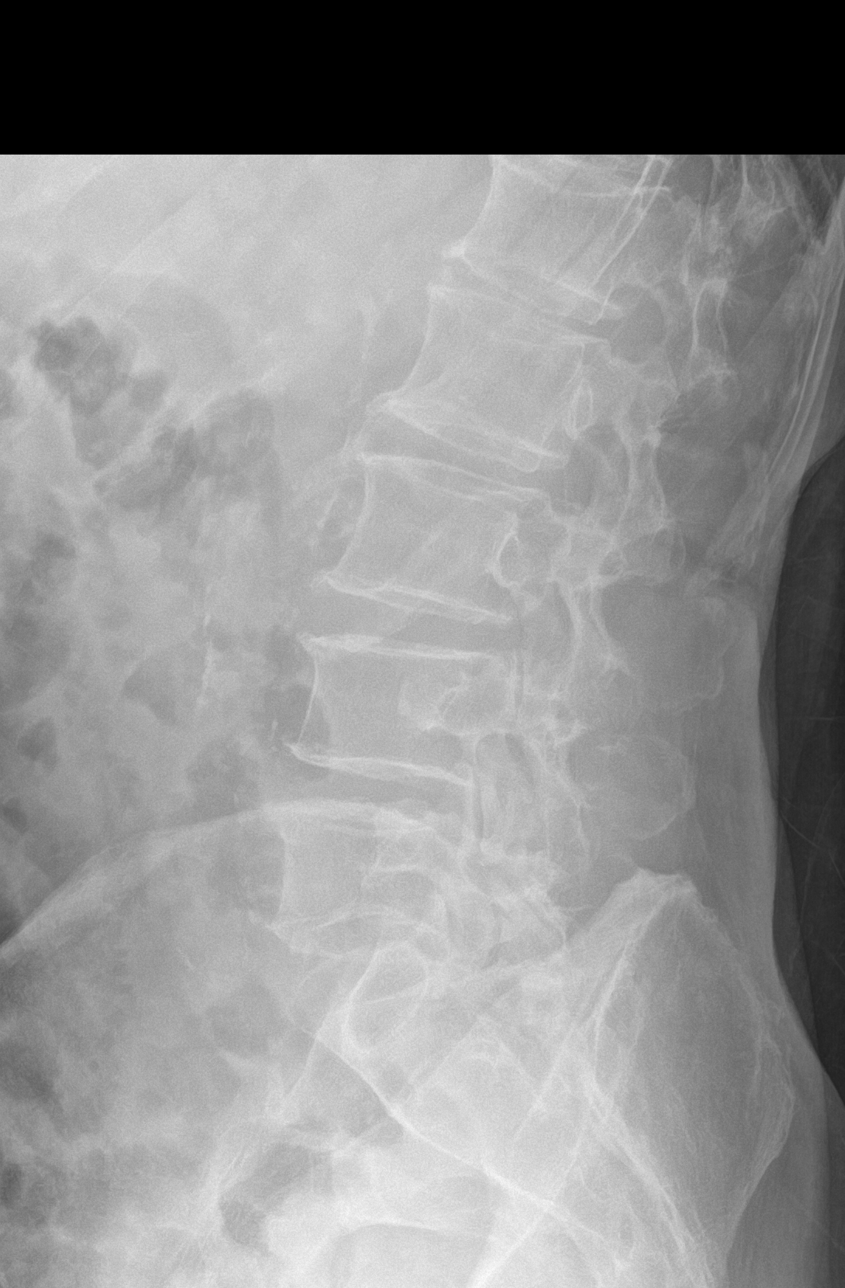

[l-spine spot]
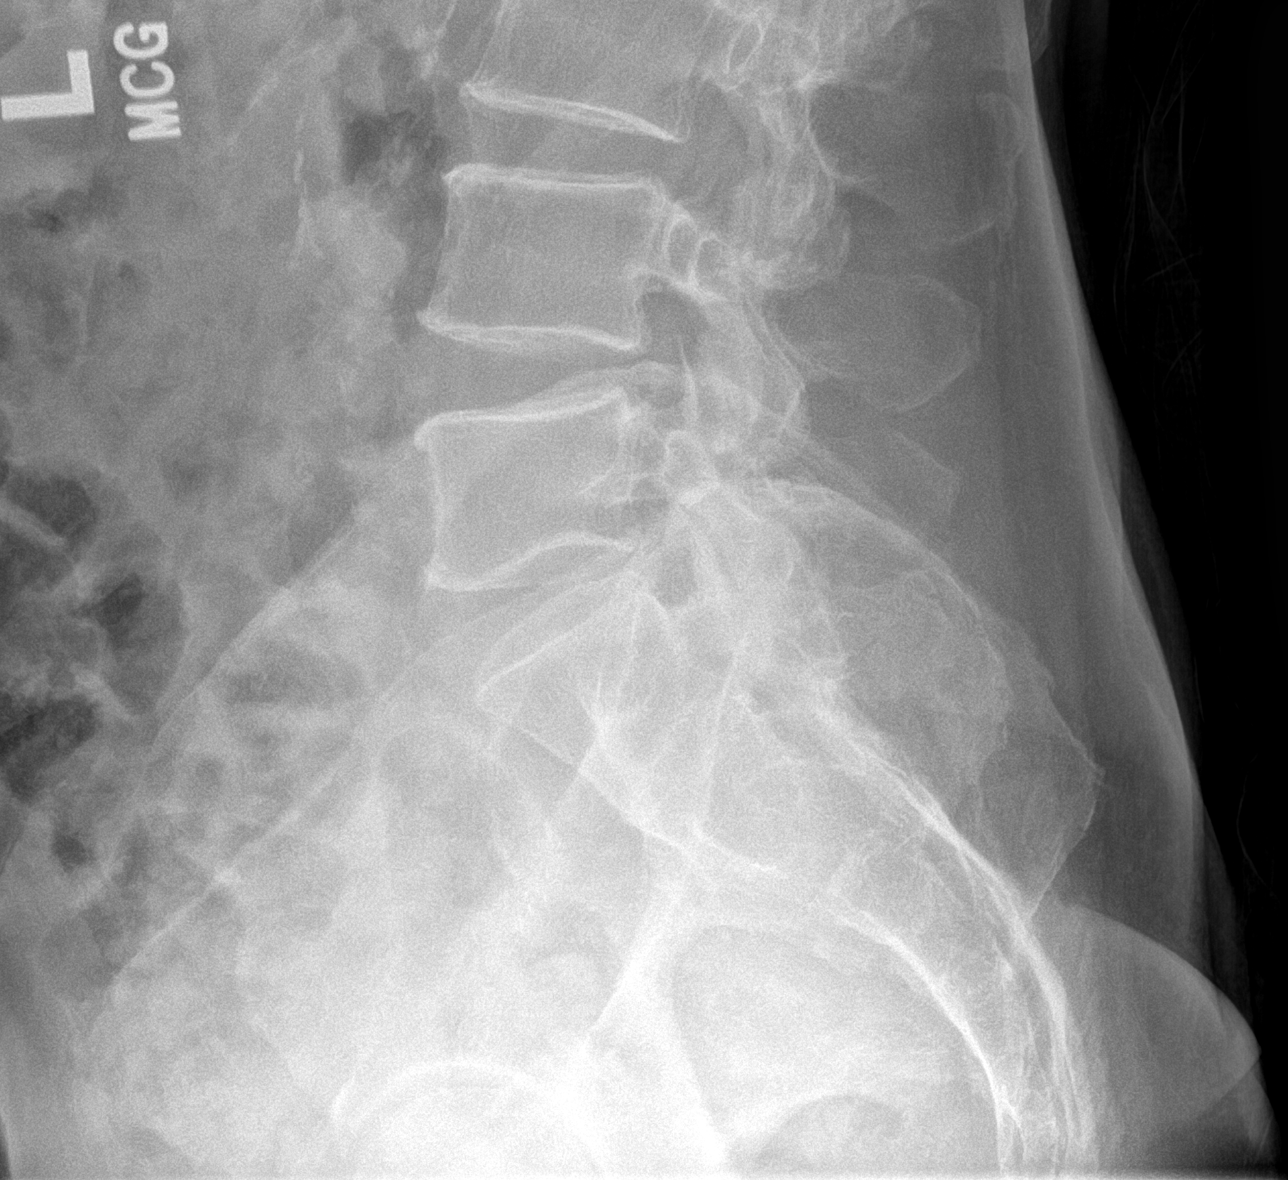

[l-spine obl (3 of 3)]
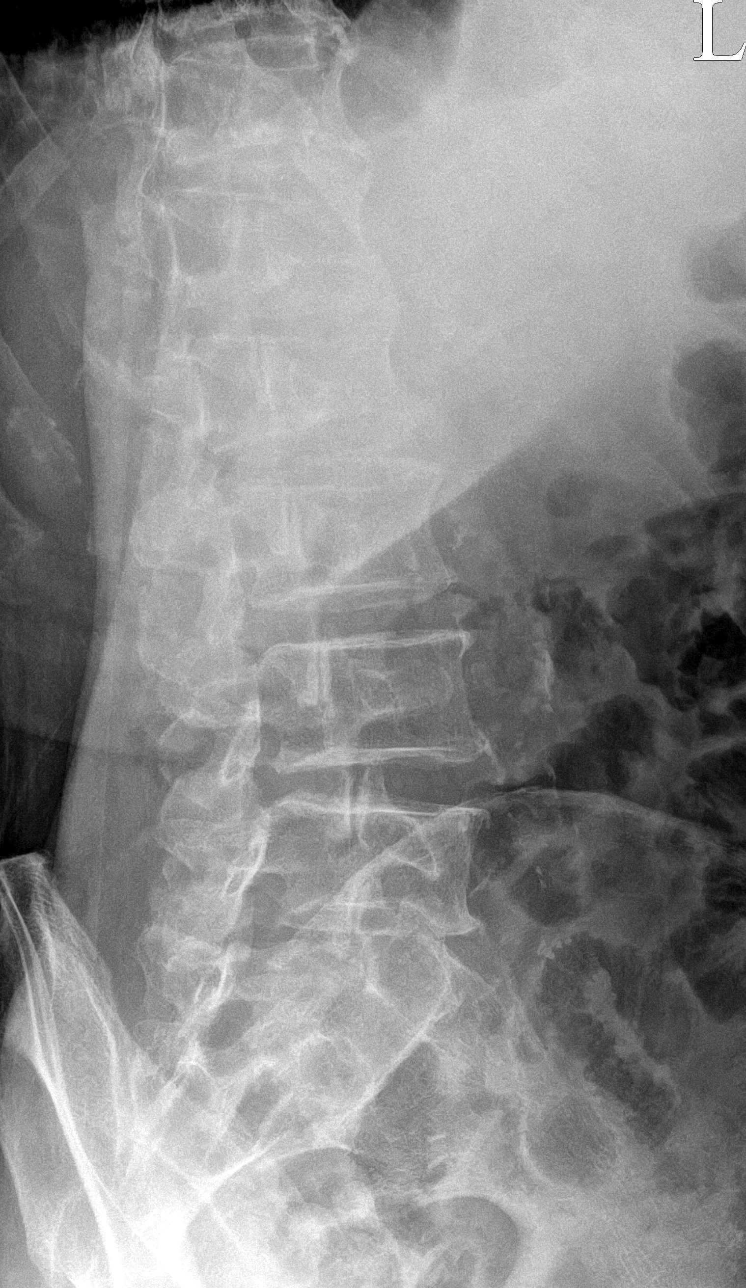

[5 of 5 positions shown; findings below may reference images not displayed]

FINDINGS: Multilevel anterior endplate osteophytosis involving the lumbar
spine. Preservation of the vertebral body and intervertebral disc
space heights. Lower lumbar spine facet degenerative changes. SI
joints unremarkable. No acute process.
IMPRESSION: No acute process.

Multilevel lower lumbar spine degenerative changes.

## 2021-08-01 MED ORDER — HYDROCODONE-ACETAMINOPHEN 5-325 MG PO TABS: 1.0000 | ORAL_TABLET | Freq: Four times a day (QID) | ORAL | 0 refills | Status: DC | PRN

## 2021-08-01 NOTE — Patient Instructions (Signed)
We will check an x-ray and if this looks good it is okay to do some exercise to help the back pain.

## 2021-08-01 NOTE — Assessment & Plan Note (Signed)
Going on for >6 weeks but in the setting of lung cancer need to exclude new process. X-ray lumbar ordered today. Refilled hydrocodone for pain and given exercises. If imaging normal okay to try exercise routine to see if this alleviates pain.

## 2021-08-01 NOTE — Progress Notes (Signed)
   Subjective:   Patient ID: Brandon Rubio., male    DOB: 08-07-1950, 71 y.o.   MRN: [id number]  HPI The patient is a 71 YO man coming in for low back pain when standing or walking for some time.  Review of Systems  Constitutional:  Positive for activity change, appetite change and fatigue.  HENT: Negative.    Eyes: Negative.   Respiratory:  Negative for cough, chest tightness and shortness of breath.   Cardiovascular:  Negative for chest pain, palpitations and leg swelling.  Gastrointestinal:  Negative for abdominal distention, abdominal pain, constipation, diarrhea, nausea and vomiting.  Musculoskeletal:  Positive for back pain.  Skin: Negative.   Neurological: Negative.   Psychiatric/Behavioral: Negative.     Objective:  Physical Exam Constitutional:      Appearance: He is well-developed.  HENT:     Head: Normocephalic and atraumatic.  Cardiovascular:     Rate and Rhythm: Normal rate and regular rhythm.  Pulmonary:     Effort: Pulmonary effort is normal. No respiratory distress.     Breath sounds: Normal breath sounds. No wheezing or rales.  Abdominal:     General: Bowel sounds are normal. There is no distension.     Palpations: Abdomen is soft.     Tenderness: There is no abdominal tenderness. There is no rebound.  Musculoskeletal:        General: Tenderness present.     Cervical back: Normal range of motion.     Comments: Lumbar tenderness paraspinal bilateral  Skin:    General: Skin is warm and dry.  Neurological:     Mental Status: He is alert and oriented to person, place, and time.     Coordination: Coordination normal.    Vitals:   08/01/21 0912  BP: 130/80  Pulse: 94  Resp: 18  SpO2: 96%  Weight: 183 lb (83 kg)  Height: 6' (1.829 m)    Assessment & Plan:

## 2021-08-01 NOTE — Assessment & Plan Note (Signed)
Refilled hydrocodone he is using for pain rarely.

## 2021-08-06 ENCOUNTER — Inpatient Hospital Stay: Payer: Medicare Other

## 2021-08-06 ENCOUNTER — Inpatient Hospital Stay: Payer: Medicare Other | Admitting: Dietician

## 2021-08-06 ENCOUNTER — Inpatient Hospital Stay (HOSPITAL_BASED_OUTPATIENT_CLINIC_OR_DEPARTMENT_OTHER): Payer: Medicare Other | Admitting: Internal Medicine

## 2021-08-06 ENCOUNTER — Other Ambulatory Visit: Payer: Self-pay

## 2021-08-06 ENCOUNTER — Encounter: Payer: Self-pay | Admitting: Internal Medicine

## 2021-08-06 VITALS — BP 98/67 | HR 92 | Temp 98.2°F | Resp 16 | Wt 186.8 lb

## 2021-08-06 DIAGNOSIS — Z5112 Encounter for antineoplastic immunotherapy: Secondary | ICD-10-CM | POA: Diagnosis not present

## 2021-08-06 DIAGNOSIS — Z79899 Other long term (current) drug therapy: Secondary | ICD-10-CM | POA: Diagnosis not present

## 2021-08-06 DIAGNOSIS — C3491 Malignant neoplasm of unspecified part of right bronchus or lung: Secondary | ICD-10-CM

## 2021-08-06 DIAGNOSIS — R5382 Chronic fatigue, unspecified: Secondary | ICD-10-CM

## 2021-08-06 DIAGNOSIS — C3431 Malignant neoplasm of lower lobe, right bronchus or lung: Secondary | ICD-10-CM | POA: Diagnosis not present

## 2021-08-06 LAB — CMP (CANCER CENTER ONLY)
ALT: 10 U/L (ref 0–44)
AST: 14 U/L — ABNORMAL LOW (ref 15–41)
Albumin: 3.8 g/dL (ref 3.5–5.0)
Alkaline Phosphatase: 59 U/L (ref 38–126)
Anion gap: 8 (ref 5–15)
BUN: 13 mg/dL (ref 8–23)
CO2: 26 mmol/L (ref 22–32)
Calcium: 9.2 mg/dL (ref 8.9–10.3)
Chloride: 101 mmol/L (ref 98–111)
Creatinine: 0.79 mg/dL (ref 0.61–1.24)
GFR, Estimated: >60 mL/min (ref >60)
Glucose, Bld: 147 mg/dL — ABNORMAL HIGH (ref 70–99)
Potassium: 3.7 mmol/L (ref 3.5–5.1)
Sodium: 135 mmol/L (ref 135–145)
Total Bilirubin: 0.4 mg/dL (ref 0.3–1.2)
Total Protein: 6.7 g/dL (ref 6.5–8.1)

## 2021-08-06 LAB — CBC WITH DIFFERENTIAL (CANCER CENTER ONLY)
Abs Immature Granulocytes: 0.03 10*3/uL (ref 0.00–0.07)
Basophils Absolute: 0.1 10*3/uL (ref 0.0–0.1)
Basophils Relative: 1 %
Eosinophils Absolute: 0.2 10*3/uL (ref 0.0–0.5)
Eosinophils Relative: 2 %
HCT: 34.4 % — ABNORMAL LOW (ref 39.0–52.0)
Hemoglobin: 12 g/dL — ABNORMAL LOW (ref 13.0–17.0)
Immature Granulocytes: 0 %
Lymphocytes Relative: 13 %
Lymphs Abs: 1.2 10*3/uL (ref 0.7–4.0)
MCH: 31 pg (ref 26.0–34.0)
MCHC: 34.9 g/dL (ref 30.0–36.0)
MCV: 88.9 fL (ref 80.0–100.0)
Monocytes Absolute: 0.6 10*3/uL (ref 0.1–1.0)
Monocytes Relative: 7 %
Neutro Abs: 7.1 10*3/uL (ref 1.7–7.7)
Neutrophils Relative %: 77 %
Platelet Count: 199 10*3/uL (ref 150–400)
RBC: 3.87 MIL/uL — ABNORMAL LOW (ref 4.22–5.81)
RDW: 12.4 % (ref 11.5–15.5)
WBC Count: 9.2 10*3/uL (ref 4.0–10.5)
nRBC: 0 % (ref 0.0–0.2)

## 2021-08-06 LAB — TSH: TSH: 2.022 u[IU]/mL (ref 0.350–4.500)

## 2021-08-06 MED ORDER — SODIUM CHLORIDE 0.9 % IV SOLN: Freq: Once | INTRAVENOUS | Status: AC

## 2021-08-06 MED ORDER — SODIUM CHLORIDE 0.9 % IV SOLN
1500.0000 mg | Freq: Once | INTRAVENOUS | Status: AC
  Administered 2021-08-06: 1500 mg via INTRAVENOUS
  Filled 2021-08-06: qty 30

## 2021-08-06 NOTE — Progress Notes (Signed)
Nutrition Assessment   Reason for Assessment: MST (wt loss, poor appetite)  ASSESSMENT: 71 year old male with stage IIIA (T4, N0, M0) non-small cell lung cancer diagnosed in October 2022. Disease progression with enlarging mass and new right hilar and mediastinal lymph nodes in December 2022. Patient is under the care of Dr. Earlie Server    PRIOR THERAPY:  1) Neoadjuvant treatment with carboplatin for AUC of 5, Alimta 500 Mg/M2 and nivolumab 360 Mg IV every 3 weeks.  First dose January 23, 2021.  Status post 3 cycles. 2) Concurrent chemoradiation with carboplatin for an AUC of 2 and paclitaxel 45 mg per metered squared.  First dose expected on 04/01/2021.  Status post 4 cycles   CURRENT THERAPY: Consolidation treatment with immunotherapy with Imfinzi 1500 Mg IV every 4 weeks.  First dose on Jul 08, 2021.  Past medical history includes CAD, DM2, DVT, GERD, HLD, HTN  Met with patient in infusion. He reports poor appetite. This has been ongoing since prior therapy. Recalls mostly eating yogurt and jello. It is not unusual for him to eat 2-3 of each instead of supper meal. Patient reports eating a halfway decent meal a couple time a week. Patient reports chronic lower back pain. He can not stand the thought of cooking because he knows this will hurt his back. Patient shares going to the grocery store is challenging, he is in significant pain before making it through the entire store. Patient is followed by PCP for this and is taking pain medication. Patient says pain medication is not the answer. He reports PCP recommended PT. He is not interested at this time. Patient plans to start completing exercises given to him by PCP. Patient reports constipation, endorses bowel movements every 2-3 days. He did have a bowel movement this morning. Patient is taking dulcolax as needed, reports sometimes taking 2 doses in one day. He has been advised to start taking stool softener per MD. Patient drinks little water,  recalls lots of coffee unsweet tea, diet soda, no added sugar juice. He does not like supplements. They all taste like chalk.   Nutrition Focused Physical Exam:   Orbital Region: moderate Buccal Region: moderate Upper Arm Region: moderate  Thoracic and Lumbar Region:  Temple Region: moderate Clavicle Bone Region: mild Shoulder and Acromion Bone Region: mild Scapular Bone Region: UTA Dorsal Hand: moderate Patellar Region: severe Anterior Thigh Region: UTA Posterior Calf Region: severe Edema (RD assessment): none Hair: reviewed Eyes: reviewed Mouth: reviewed  Skin: reviewed (dry, scaly)  Nails: reviewed    Medications: norco, compazine, metformin, prednisone, dulcolax, MVI, prilosec, zocar   Labs: glucose 147   Anthropometrics: Weights have decreased 7% (14 lbs) in 9 weeks. Patient has lost 24 lbs (11%) over the last 5 months and is ~34 lbs (15%) under usual body weight.   Height: 6' Weight: 186 lb 12.8 oz UBW: 217-220 lb (July-Nov 2022) BMI: 25.33  5/1 - 186 lb 8 oz 4/24 - 188 lb  3/24 - 200 lb  2/13 - 209 lb 8 oz 12/30 - 210 lb 8.6 oz 10/4 - 220 lb 12.8 oz    MALNUTRITION DIAGNOSIS: Patient meets criteria for severe malnutrition in the context of chronic disease as evidenced by moderate fat depletion, mild/moderate, severe muscle depletion, intake meeting less than <75% of estimated needs per recall, 11% (24 lb) wt loss in 5 months which is significant for time frame.    INTERVENTION:  Educated on importance of adequate calories and protein to maintain strength/weights  Encouraged small frequent meals and snacks including good sources of protein - handout with snack ideas, soft moist high protein foods, tips for poor appetite provided Discussed strategies for constipation, encouraged increased intake of fiber and importance of hydration - handout with tips provided Educated on ways to add calories/protein to foods (using whole milk vs water in creamy soups, oatmeal,  adding cheese) and recommended trying CIB as alternate oral nutrition supplement - wrote this down for patient on handout Suggested keeping variety of frozen/microwaved meals to reduce need for cooking Contact information provided    MONITORING, EVALUATION, GOAL: Patient will tolerate increased calories and protein to minimize further weight loss   Next Visit: Monday June 26 in infusion with Desmond Lope

## 2021-08-06 NOTE — Patient Instructions (Signed)
Steps to Quit Smoking Smoking tobacco is the leading cause of preventable death. It can affect almost every organ in the body. Smoking puts you and people around you at risk for many serious, long-lasting (chronic) diseases. Quitting smoking can be hard, but it is one of the best things that you can do for your health. It is never too late to quit. Do not give up if you cannot quit the first time. Some people need to try many times to quit. Do your best to stick to your quit plan, and talk with your doctor if you have any questions or concerns. How do I get ready to quit? Pick a date to quit. Set a date within the next 2 weeks to give you time to prepare. Write down the reasons why you are quitting. Keep this list in places where you will see it often. Tell your family, friends, and co-workers that you are quitting. Their support is important. Talk with your doctor about the choices that may help you quit. Find out if your health insurance will pay for these treatments. Know the people, places, things, and activities that make you want to smoke (triggers). Avoid them. What first steps can I take to quit smoking? Throw away all cigarettes at home, at work, and in your car. Throw away the things that you use when you smoke, such as ashtrays and lighters. Clean your car. Empty the ashtray. Clean your home, including curtains and carpets. What can I do to help me quit smoking? Talk with your doctor about taking medicines and seeing a counselor. You are more likely to succeed when you do both. If you are pregnant or breastfeeding: Talk with your doctor about counseling or other ways to quit smoking. Do not take medicine to help you quit smoking unless your doctor tells you to. Quit right away Quit smoking completely, instead of slowly cutting back on how much you smoke over a period of time. Stopping smoking right away may be more successful than slowly quitting. Go to counseling. In-person is best  if this is an option. You are more likely to quit if you go to counseling sessions regularly. Take medicine You may take medicines to help you quit. Some medicines need a prescription, and some you can buy over-the-counter. Some medicines may contain a drug called nicotine to replace the nicotine in cigarettes. Medicines may: Help you stop having the desire to smoke (cravings). Help to stop the problems that come when you stop smoking (withdrawal symptoms). Your doctor may ask you to use: Nicotine patches, gum, or lozenges. Nicotine inhalers or sprays. Non-nicotine medicine that you take by mouth. Find resources Find resources and other ways to help you quit smoking and remain smoke-free after you quit. They include: Online chats with a counselor. Phone quitlines. Printed self-help materials. Support groups or group counseling. Text messaging programs. Mobile phone apps. Use apps on your mobile phone or tablet that can help you stick to your quit plan. Examples of free services include Quit Guide from the CDC and smokefree.gov  What can I do to make it easier to quit?  Talk to your family and friends. Ask them to support and encourage you. Call a phone quitline, such as 1-800-QUIT-NOW, reach out to support groups, or work with a counselor. Ask people who smoke to not smoke around you. Avoid places that make you want to smoke, such as: Bars. Parties. Smoke-break areas at work. Spend time with people who do not smoke. Lower   the stress in your life. Stress can make you want to smoke. Try these things to lower stress: Getting regular exercise. Doing deep-breathing exercises. Doing yoga. Meditating. What benefits will I see if I quit smoking? Over time, you may have: A better sense of smell and taste. Less coughing and sore throat. A slower heart rate. Lower blood pressure. Clearer skin. Better breathing. Fewer sick days. Summary Quitting smoking can be hard, but it is one of  the best things that you can do for your health. Do not give up if you cannot quit the first time. Some people need to try many times to quit. When you decide to quit smoking, make a plan to help you succeed. Quit smoking right away, not slowly over a period of time. When you start quitting, get help and support to keep you smoke-free. This information is not intended to replace advice given to you by your health care provider. Make sure you discuss any questions you have with your health care provider. Document Revised: 02/15/2021 Document Reviewed: 02/15/2021 Elsevier Patient Education  2023 Elsevier Inc.  

## 2021-08-06 NOTE — Patient Instructions (Signed)
[facility]   Discharge Instructions: Thank you for choosing [facility] to provide your oncology and hematology care.   If you have a lab appointment with the Cancer Center, please go directly to the Cancer Center and check in at the registration area.   Wear comfortable clothing and clothing appropriate for easy access to any Portacath or PICC line.   We strive to give you quality time with your provider. You may need to reschedule your appointment if you arrive late (15 or more minutes).  Arriving late affects you and other patients whose appointments are after yours.  Also, if you miss three or more appointments without notifying the office, you may be dismissed from the clinic at the provider's discretion.      For prescription refill requests, have your pharmacy contact our office and allow 72 hours for refills to be completed.    Today you received the following chemotherapy and/or immunotherapy agents: durvalumab.      To help prevent nausea and vomiting after your treatment, we encourage you to take your nausea medication as directed.  BELOW ARE SYMPTOMS THAT SHOULD BE REPORTED IMMEDIATELY: *FEVER GREATER THAN 100.4 F (38 C) OR HIGHER *CHILLS OR SWEATING *NAUSEA AND VOMITING THAT IS NOT CONTROLLED WITH YOUR NAUSEA MEDICATION *UNUSUAL SHORTNESS OF BREATH *UNUSUAL BRUISING OR BLEEDING *URINARY PROBLEMS (pain or burning when urinating, or frequent urination) *BOWEL PROBLEMS (unusual diarrhea, constipation, pain near the anus) TENDERNESS IN MOUTH AND THROAT WITH OR WITHOUT PRESENCE OF ULCERS (sore throat, sores in mouth, or a toothache) UNUSUAL RASH, SWELLING OR PAIN  UNUSUAL VAGINAL DISCHARGE OR ITCHING   Items with * indicate a potential emergency and should be followed up as soon as possible or go to the Emergency Department if any problems should occur.  Please show the CHEMOTHERAPY ALERT CARD or IMMUNOTHERAPY ALERT CARD at check-in  to the Emergency Department and triage nurse.  Should you have questions after your visit or need to cancel or reschedule your appointment, please contact [facility]  Dept: [phone]  and follow the prompts.  Office hours are 8:00 a.m. to 4:30 p.m. Monday - Friday. Please note that voicemails left after 4:00 p.m. may not be returned until the following business day.  We are closed weekends and major holidays. You have access to a nurse at all times for urgent questions. Please call the main number to the clinic Dept: [phone] and follow the prompts.   For any non-urgent questions, you may also contact your provider using MyChart. We now offer e-Visits for anyone 18 and older to request care online for non-urgent symptoms. For details visit [email or web address].   Also download the MyChart app! Go to the app store, search "MyChart", open the app, select [facility], and log in with your MyChart username and password.  Due to Covid, a mask is required upon entering the hospital/clinic. If you do not have a mask, one will be given to you upon arrival. For doctor visits, patients may have 1 support person aged 18 or older with them. For treatment visits, patients cannot have anyone with them due to current Covid guidelines and our immunocompromised population.   

## 2021-08-06 NOTE — Progress Notes (Signed)
[facility] [phone]   [phone]  OFFICE PROGRESS NOTE  [name], MD [place] [address]  DIAGNOSIS: Stage IIIA (T4, N0, M0) non-small cell lung cancer, favoring adenocarcinoma presented with large right lower lobe lung mass with no mediastinal lymphadenopathy or extrathoracic metastasis diagnosed in October 2022. Had disease progression with enlarging mass and new right hilar and mediastinal lymph nodes in December 2022.      PRIOR THERAPY:  1) Neoadjuvant treatment with carboplatin for AUC of 5, Alimta 500 Mg/M2 and nivolumab 360 Mg IV every 3 weeks.  First dose January 23, 2021.  Status post 3 cycles. 2) Concurrent chemoradiation with carboplatin for an AUC of 2 and paclitaxel 45 mg per metered squared.  First dose expected on 04/01/2021.  Status post 4 cycles   CURRENT THERAPY: Consolidation treatment with immunotherapy with Imfinzi 1500 Mg IV every 4 weeks.  First dose on Jul 08, 2021.  Status post 1 cycle  INTERVAL HISTORY: Brandon Rubio. 71 y.o. male returns to the clinic today for follow-up visit.  The patient is feeling fine today with no concerning complaints except for chronic back pain secondary to degenerative disc disease and he is currently on pain medication by his primary care physician.  He also has constipation with the pain medication and takes Dulcolax on as-needed basis.  He denied having any chest pain, shortness of breath, cough or hemoptysis.  He denied having any fever or chills.  He has no nausea, vomiting, diarrhea but constipation.  He tolerated the first cycle of his treatment with immunotherapy fairly well.  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  MEDICATIONS:  Current Outpatient Medications  Medication Sig Dispense Refill   aspirin EC 81 MG tablet Take 81 mg by mouth in the morning. Swallow whole.     bisacodyl (DULCOLAX) 5 MG EC tablet Take 5 mg by mouth daily as needed for moderate constipation.     HYDROcodone-acetaminophen (NORCO) 5-325 MG tablet Take 1 tablet by mouth every 6 (six) hours as needed for moderate pain. 60 tablet 0   metFORMIN (GLUCOPHAGE) 1000 MG tablet TAKE 1 TABLET(1000 MG) BY MOUTH TWICE DAILY WITH A MEAL 180 tablet 1   Multiple Vitamins-Minerals (CENTRUM SILVER 50+MEN) TABS Take 1 tablet by mouth daily with breakfast.     omeprazole (PRILOSEC) 20 MG capsule TAKE 1 CAPSULE(20 MG) BY MOUTH TWICE DAILY BEFORE A MEAL 180 capsule 3   predniSONE (DELTASONE) 5 MG tablet Take 1.5 tablets (7.5 mg total) by mouth daily with breakfast. 60 tablet 5   simvastatin (ZOCOR) 40 MG tablet Take 1 tablet (40 mg total) by mouth daily at 6 PM. 90 tablet 3   midodrine (PROAMATINE) 10 MG tablet Take 1 tablet (10 mg total) by mouth 3 (three) times daily with meals. (Patient not taking: Reported on 08/06/2021)  90 tablet 0   prochlorperazine (COMPAZINE) 10 MG tablet TAKE 1 TABLET(10 MG) BY MOUTH EVERY 6 HOURS AS NEEDED FOR NAUSEA OR VOMITING (Patient not taking: Reported on 08/06/2021) 30 tablet 0   No current facility-administered medications for this visit.    SURGICAL HISTORY:  Past Surgical History:  Procedure Laterality Date   BRONCHIAL BIOPSY  12/10/2020   Procedure: BRONCHIAL BIOPSIES;  Surgeon: Collene Gobble, MD;  Location[facility] ENDOSCOPY;  Service: Pulmonary;;   BRONCHIAL BRUSHINGS  12/10/2020   Procedure: BRONCHIAL BRUSHINGS;  Surgeon: Collene Gobble, MD;  Location: [facility] ENDOSCOPY;  Service: Pulmonary;;   BRONCHIAL NEEDLE ASPIRATION BIOPSY  12/10/2020   Procedure: BRONCHIAL NEEDLE ASPIRATION BIOPSIES;  Surgeon: Collene Gobble, MD;  Location: [facility] ENDOSCOPY;  Service: Pulmonary;;   COLONOSCOPY     COLONOSCOPY W/ POLYPECTOMY  03/10/2009   LARYNGOSCOPY  03/10/1997   VIDEO BRONCHOSCOPY WITH ENDOBRONCHIAL NAVIGATION N/A 12/10/2020   Procedure: ROBOTIC [place];  Surgeon: Collene Gobble, MD;  Location: [facility] ENDOSCOPY;  Service: Pulmonary;  Laterality: N/A;    REVIEW OF SYSTEMS:  A comprehensive review of systems was negative except for: Constitutional: positive for fatigue Gastrointestinal: positive for constipation Musculoskeletal: positive for back pain   PHYSICAL EXAMINATION: General appearance: alert, cooperative, fatigued, and no distress Head: Normocephalic, without obvious abnormality, atraumatic Neck: no adenopathy, no JVD, supple, symmetrical, trachea midline, and thyroid not enlarged, symmetric, no tenderness/mass/nodules Lymph nodes: Cervical, supraclavicular, and axillary nodes normal. Resp: clear to auscultation bilaterally Back: symmetric, no curvature. ROM normal. No CVA tenderness. Cardio: regular rate and rhythm, S1, S2 normal, no murmur, click, rub or gallop GI: soft, non-tender; bowel sounds normal; no masses,  no organomegaly Extremities: extremities normal, atraumatic, no cyanosis or edema  ECOG PERFORMANCE STATUS: 1 - Symptomatic but completely ambulatory  Blood pressure 98/67, pulse 92, temperature 98.2 F (36.8 C), temperature source Tympanic, resp. rate 16, weight 186 lb 12.8 oz (84.7 kg), SpO2 97 %.  LABORATORY DATA: Lab Results  Component Value Date   WBC 9.2 08/06/2021   HGB 12.0 (L) 08/06/2021   HCT 34.4 (L) 08/06/2021   MCV 88.9 08/06/2021   PLT 199 08/06/2021      Chemistry      Component Value Date/Time   NA 135 07/08/2021 1104   K 3.9 07/08/2021  1104   CL 101 07/08/2021 1104   CO2 26 07/08/2021 1104   BUN 12 07/08/2021 1104   CREATININE 0.82 07/08/2021 1104      Component Value Date/Time   CALCIUM 9.6 07/08/2021 1104   ALKPHOS 60 07/08/2021 1104   AST 14 (L) 07/08/2021 1104   ALT 11 07/08/2021 1104   BILITOT 0.4 07/08/2021 1104       RADIOGRAPHIC STUDIES: DG Lumbar Spine Complete  Result Date: 08/02/2021 CLINICAL DATA:  Back pain.  History of lung cancer. EXAM: LUMBAR SPINE - COMPLETE 4+ VIEW COMPARISON:  CT abdomen pelvis May 10, 2021 FINDINGS: Multilevel anterior endplate osteophytosis involving the lumbar spine. Preservation of the vertebral body and intervertebral disc space heights. Lower lumbar spine facet degenerative changes. SI joints unremarkable. No acute process. IMPRESSION: No acute process. Multilevel lower lumbar spine degenerative changes. Electronically Signed   By: Lovey Newcomer M.D.   On: 08/02/2021 15:18    ASSESSMENT AND PLAN: This is a very pleasant 71 years old white male with history of a stage IIIA (T4, N1, M0) non-small cell lung cancer, adenocarcinoma presented with right lower lobe lung mass with a  right hilar and no mediastinal lymphadenopathy and no evidence of extrathoracic disease.  The patient was diagnosed in October 2022 status post 3 cycles of neoadjuvant systemic chemotherapy with carboplatin, Alimta and nivolumab but unfortunately has no evidence for significant improvement of his disease and actually has some evidence for progression. The patient underwent a course of concurrent chemoradiation weekly carboplatin for AUC of 2 and paclitaxel 45 Mg/M2.  Status post 4 cycles. The patient had a rough time with the course of concurrent chemoradiation and he was hospitalized several times during this course. Imaging studies after the course of concurrent chemoradiation showed no concerning findings for disease progression. The patient is currently undergoing treatment with immunotherapy with Imfinzi  1500 Mg IV every 4 weeks status post 1 cycle. The patient tolerated the first cycle of his treatment fairly well with no concerning adverse effects. I recommended for him to proceed with cycle #2 today as planned. For the constipation he will continue with Dulcolax and he was also advised to use stool softener and MiraLAX as needed. He will come back for follow-up visit in 4 weeks for evaluation before the next cycle of his treatment. The patient was advised to call immediately if he has any other concerning symptoms in the interval..The patient voices understanding of current disease status and treatment options and is in agreement with the current care plan.  All questions were answered. The patient knows to call the clinic with any problems, questions or concerns. We can certainly see the patient much sooner if necessary.  The total time spent in the appointment was 20 minutes.  Disclaimer: This note was dictated with voice recognition software. Similar sounding words can inadvertently be transcribed and may not be corrected upon review.

## 2021-08-07 ENCOUNTER — Encounter: Payer: Self-pay | Admitting: *Deleted

## 2021-08-07 DIAGNOSIS — Z7984 Long term (current) use of oral hypoglycemic drugs: Secondary | ICD-10-CM | POA: Diagnosis not present

## 2021-08-07 DIAGNOSIS — F17210 Nicotine dependence, cigarettes, uncomplicated: Secondary | ICD-10-CM

## 2021-08-07 DIAGNOSIS — I10 Essential (primary) hypertension: Secondary | ICD-10-CM

## 2021-08-07 DIAGNOSIS — I2510 Atherosclerotic heart disease of native coronary artery without angina pectoris: Secondary | ICD-10-CM | POA: Diagnosis not present

## 2021-08-07 DIAGNOSIS — E1159 Type 2 diabetes mellitus with other circulatory complications: Secondary | ICD-10-CM | POA: Diagnosis not present

## 2021-08-07 DIAGNOSIS — E785 Hyperlipidemia, unspecified: Secondary | ICD-10-CM

## 2021-08-07 NOTE — Progress Notes (Signed)
Oncology Nurse Navigator Documentation     08/07/2021   11:00 AM 07/01/2021   10:00 AM 05/07/2021    9:00 AM 04/17/2021   10:00 AM 04/15/2021    9:00 AM 01/24/2021   12:00 PM 01/18/2021   10:00 AM  Oncology Nurse Navigator Flowsheets  Planned Course of Treatment     Chemo/Radiation Concurrent  Neo Chemo;Targeted Therapy  Phase of Treatment     Radiation  Targeted Therapy  Chemotherapy Actual Start Date:       01/24/2021  Radiation Actual Start Date:     03/27/2021    Targeted Therapy Actual Start Date:       01/24/2021  Navigator Follow Up Date: 09/02/2021  05/13/2021  04/29/2021 02/06/2021 01/21/2021  Navigator Follow Up Reason: Follow-up Appointment  Follow-up Appointment  Follow-up Appointment Follow-up Appointment Education  Navigator Location [place]  Navigator Encounter Type Appt/Treatment Plan Review Follow-up Appt;Clinic/MDC Clinic/MDC;Follow-up Appt Lobby Clinic/MDC Other: Telephone  Telephone       Education;Outgoing Call;Asess Navigation Needs  Treatment Initiated Date       01/24/2021  Patient Visit Type Other MedOnc;Follow-up Follow-up;MedOnc  Follow-up;MedOnc Other Other  Treatment Phase Treatment Other Treatment Treatment Treatment Treatment Pre-Tx/Tx Discussion  Barriers/Navigation Needs Coordination of Care/I followed up on Brandon Rubio recent note on Brandon Rubio. He is doing well with first cycle IO therapy.  He is set up for his follow up cycle and appt with Brandon Rubio.  Education Production designer, theatre/television/film  Other Other  Other  Other  Interventions Coordination of Care Education;Psycho-Social Support Psycho-Social Support;Education Psycho-Social Support Psycho-Social Support;Education Coordination of Care Coordination of Care;Education;Psycho-Social Support  Acuity Level 2-Minimal Needs (1-2 Barriers Identified)  Level 2-Minimal Needs (1-2 Barriers Identified) Level 2-Minimal Needs (1-2 Barriers Identified)  Level 2-Minimal Needs (1-2 Barriers Identified) Level 2-Minimal Needs (1-2 Barriers Identified) Level 2-Minimal Needs (1-2 Barriers Identified)  Coordination of Care Other     Pathology   Education Method  Verbal;Other Verbal  Verbal  Verbal  Time Spent with Patient 30 30 30 15 30 30  15

## 2021-08-14 ENCOUNTER — Telehealth: Payer: Self-pay | Admitting: Internal Medicine

## 2021-08-14 NOTE — Telephone Encounter (Signed)
Scheduled per 05/30 los, patient has been called and notified. Canceled nutrition appointment per patient's request.

## 2021-08-14 NOTE — Telephone Encounter (Signed)
Scheduled per 05/30 los, patient has been called and notified.

## 2021-08-23 ENCOUNTER — Other Ambulatory Visit: Payer: Self-pay | Admitting: Internal Medicine

## 2021-08-29 ENCOUNTER — Encounter: Payer: Self-pay | Admitting: *Deleted

## 2021-08-29 NOTE — Progress Notes (Signed)
I followed up on Brandon Rubio treatment plan schedule and noted he is set up at this time.

## 2021-08-30 ENCOUNTER — Ambulatory Visit (INDEPENDENT_AMBULATORY_CARE_PROVIDER_SITE_OTHER): Payer: Medicare Other | Admitting: Internal Medicine

## 2021-08-30 ENCOUNTER — Encounter: Payer: Self-pay | Admitting: Internal Medicine

## 2021-08-30 VITALS — BP 118/68 | HR 105 | Resp 18 | Ht 72.0 in | Wt 179.2 lb

## 2021-08-30 DIAGNOSIS — M545 Low back pain, unspecified: Secondary | ICD-10-CM

## 2021-08-30 DIAGNOSIS — I2510 Atherosclerotic heart disease of native coronary artery without angina pectoris: Secondary | ICD-10-CM | POA: Diagnosis not present

## 2021-08-30 DIAGNOSIS — C3491 Malignant neoplasm of unspecified part of right bronchus or lung: Secondary | ICD-10-CM

## 2021-08-30 DIAGNOSIS — E118 Type 2 diabetes mellitus with unspecified complications: Secondary | ICD-10-CM

## 2021-08-30 DIAGNOSIS — G8929 Other chronic pain: Secondary | ICD-10-CM | POA: Diagnosis not present

## 2021-08-30 MED ORDER — HYDROCODONE-ACETAMINOPHEN 5-325 MG PO TABS: 1.0000 | ORAL_TABLET | Freq: Four times a day (QID) | ORAL | 0 refills | Status: DC | PRN

## 2021-08-30 NOTE — Assessment & Plan Note (Signed)
Refill hydrocodone 5/325 #60 per month. We did review x-ray findings and I suspect the reason his pain is worsening is continued loss of weight/muscle mass. He is down another 4 pounds since last visit and this is causing less support and strength in his spine. He has lidocaine patches at home advised to try these as well. Given exercises to help.

## 2021-09-02 ENCOUNTER — Other Ambulatory Visit: Payer: Self-pay

## 2021-09-02 ENCOUNTER — Inpatient Hospital Stay: Payer: Medicare Other

## 2021-09-02 ENCOUNTER — Inpatient Hospital Stay (HOSPITAL_BASED_OUTPATIENT_CLINIC_OR_DEPARTMENT_OTHER): Payer: Medicare Other | Admitting: Internal Medicine

## 2021-09-02 ENCOUNTER — Encounter: Payer: Self-pay | Admitting: Internal Medicine

## 2021-09-02 ENCOUNTER — Inpatient Hospital Stay: Payer: Medicare Other | Attending: Internal Medicine

## 2021-09-02 VITALS — BP 111/66 | HR 95 | Temp 98.6°F | Resp 18 | Wt 179.2 lb

## 2021-09-02 DIAGNOSIS — Z923 Personal history of irradiation: Secondary | ICD-10-CM | POA: Diagnosis not present

## 2021-09-02 DIAGNOSIS — Z5112 Encounter for antineoplastic immunotherapy: Secondary | ICD-10-CM | POA: Insufficient documentation

## 2021-09-02 DIAGNOSIS — Z79899 Other long term (current) drug therapy: Secondary | ICD-10-CM | POA: Insufficient documentation

## 2021-09-02 DIAGNOSIS — K5900 Constipation, unspecified: Secondary | ICD-10-CM | POA: Insufficient documentation

## 2021-09-02 DIAGNOSIS — C3490 Malignant neoplasm of unspecified part of unspecified bronchus or lung: Secondary | ICD-10-CM | POA: Diagnosis not present

## 2021-09-02 DIAGNOSIS — C3431 Malignant neoplasm of lower lobe, right bronchus or lung: Secondary | ICD-10-CM | POA: Insufficient documentation

## 2021-09-02 DIAGNOSIS — Z9221 Personal history of antineoplastic chemotherapy: Secondary | ICD-10-CM | POA: Diagnosis not present

## 2021-09-02 DIAGNOSIS — C3491 Malignant neoplasm of unspecified part of right bronchus or lung: Secondary | ICD-10-CM

## 2021-09-02 DIAGNOSIS — R5382 Chronic fatigue, unspecified: Secondary | ICD-10-CM

## 2021-09-02 LAB — CBC WITH DIFFERENTIAL (CANCER CENTER ONLY)
Abs Immature Granulocytes: 0.03 10*3/uL (ref 0.00–0.07)
Basophils Absolute: 0 10*3/uL (ref 0.0–0.1)
Basophils Relative: 0 %
Eosinophils Absolute: 0.2 10*3/uL (ref 0.0–0.5)
Eosinophils Relative: 2 %
HCT: 34.2 % — ABNORMAL LOW (ref 39.0–52.0)
Hemoglobin: 11.7 g/dL — ABNORMAL LOW (ref 13.0–17.0)
Immature Granulocytes: 0 %
Lymphocytes Relative: 13 %
Lymphs Abs: 1.3 10*3/uL (ref 0.7–4.0)
MCH: 29.9 pg (ref 26.0–34.0)
MCHC: 34.2 g/dL (ref 30.0–36.0)
MCV: 87.5 fL (ref 80.0–100.0)
Monocytes Absolute: 0.7 10*3/uL (ref 0.1–1.0)
Monocytes Relative: 7 %
Neutro Abs: 8.1 10*3/uL — ABNORMAL HIGH (ref 1.7–7.7)
Neutrophils Relative %: 78 %
Platelet Count: 206 10*3/uL (ref 150–400)
RBC: 3.91 MIL/uL — ABNORMAL LOW (ref 4.22–5.81)
RDW: 12.9 % (ref 11.5–15.5)
WBC Count: 10.4 10*3/uL (ref 4.0–10.5)
nRBC: 0 % (ref 0.0–0.2)

## 2021-09-02 LAB — CMP (CANCER CENTER ONLY)
ALT: 10 U/L (ref 0–44)
AST: 11 U/L — ABNORMAL LOW (ref 15–41)
Albumin: 3.9 g/dL (ref 3.5–5.0)
Alkaline Phosphatase: 57 U/L (ref 38–126)
Anion gap: 9 (ref 5–15)
BUN: 17 mg/dL (ref 8–23)
CO2: 25 mmol/L (ref 22–32)
Calcium: 9.1 mg/dL (ref 8.9–10.3)
Chloride: 105 mmol/L (ref 98–111)
Creatinine: 0.83 mg/dL (ref 0.61–1.24)
GFR, Estimated: >60 mL/min (ref >60)
Glucose, Bld: 149 mg/dL — ABNORMAL HIGH (ref 70–99)
Potassium: 3.6 mmol/L (ref 3.5–5.1)
Sodium: 139 mmol/L (ref 135–145)
Total Bilirubin: 0.3 mg/dL (ref 0.3–1.2)
Total Protein: 6.9 g/dL (ref 6.5–8.1)

## 2021-09-02 LAB — TSH: TSH: 1.552 u[IU]/mL (ref 0.350–4.500)

## 2021-09-02 MED ORDER — SODIUM CHLORIDE 0.9 % IV SOLN: Freq: Once | INTRAVENOUS | Status: AC

## 2021-09-02 MED ORDER — SODIUM CHLORIDE 0.9 % IV SOLN
1500.0000 mg | Freq: Once | INTRAVENOUS | Status: AC
  Administered 2021-09-02: 1500 mg via INTRAVENOUS
  Filled 2021-09-02: qty 30

## 2021-09-03 ENCOUNTER — Encounter: Payer: Self-pay | Admitting: *Deleted

## 2021-09-16 ENCOUNTER — Ambulatory Visit (INDEPENDENT_AMBULATORY_CARE_PROVIDER_SITE_OTHER): Payer: Medicare Other | Admitting: Internal Medicine

## 2021-09-16 ENCOUNTER — Encounter: Payer: Self-pay | Admitting: Internal Medicine

## 2021-09-16 ENCOUNTER — Other Ambulatory Visit: Payer: Self-pay | Admitting: Internal Medicine

## 2021-09-16 VITALS — BP 130/79 | HR 108 | Ht 72.0 in | Wt 179.0 lb

## 2021-09-16 DIAGNOSIS — I2510 Atherosclerotic heart disease of native coronary artery without angina pectoris: Secondary | ICD-10-CM

## 2021-09-16 DIAGNOSIS — E2749 Other adrenocortical insufficiency: Secondary | ICD-10-CM

## 2021-09-16 DIAGNOSIS — E118 Type 2 diabetes mellitus with unspecified complications: Secondary | ICD-10-CM | POA: Diagnosis not present

## 2021-09-16 NOTE — Patient Instructions (Addendum)
ease continue Prednisone mg in am   You absolutely need to take this medication every day and not skip doses.  Please double the dose if you have a fever, for the duration of the fever.  Also, if you have significant other stress like dehydration, diarrhea, or otherwise unexplained weakness.  If you cannot take anything by mouth (vomiting) or you have severe diarrhea so that you eliminate the Prednisone pills in your stool, please make sure that you get the steroid in the vein instead - go to the nearest emergency department/urgent care or you may go to your PCPs office.  Please try to get a MedAlert bracelet or pendant indicating: "Adrenal insufficiency".   Please come back for a follow-up appointment in  6 months.

## 2021-09-16 NOTE — Progress Notes (Signed)
Patient ID: Brandon Sheeler., male   DOB: 25-Feb-1951, 71 y.o.   MRN: [id number]  HPI  Brandon Rubio. is a 71 y.o.-year-old male, initially referred by his PCP, Dr. [name], returning for management of adrenal insufficiency.  Last visit months ago.  Interim history: Patient has a history of lung cancer, with history of immunotherapy chemotherapy (but cancer continued to grow) and then radiation treatment which was stopped due to intolerance.  However, after last visit, he restarted immunotherapy 1x a mo. At last visit, he presented soon after the previous appointment after an admission for significant weakness.  We increased his hydrocortisone dose.  However, he called afterwards due to upset stomach and increased sweating with hydrocortisone so we discussed about starting prednisone. At this visit, he still has weakness and fatigue. He has back pain.  No nausea or vomiting.  Reviewed and addended history: Patient has a history of lung cancer stage III diagnosed 11/2020.  He was started on ChTx and Immuno Tx.  After 2 ChTx, he got very sick >> admitted. He then tried ChTx (but platelets decreased to low) and RxTx >> very sick >> admitted.  Of note, he got dexamethasone with his treatments.  At last visit, he mentions that he completed 31/33 RxTx. This was resumed afterwards.  During both admissions, he was found to have dizziness and orthostatic  hypotension and had a low cortisol, which did not stimulate well with cosyntropin: Component     Latest Ref Rng & Units 05/11/2021 05/12/2021  Cortisol, Base     ug/dL  0.7  Cortisol, 30 Min     ug/dL  8.0  Cortisol, 60 Min     ug/dL  11.6  Cortisol, Plasma     ug/dL 1.1   C206 ACTH     7.2 - 63.3 pg/mL  <1.5 (L)   MRI (05/15/2021); no pituitary mass  An adrenal insufficiency diagnosis was made and he was started on hydrocortisone: - 15 mg in am (7-8 am) - 10 mg in pm (6-7 pm)  At our first visit, I advised him to decrease the dose  to: - 10 mg in am - 10 mg in pm  However, he was admitted again afterwards for the dizziness and weakness so we increased his hydrocortisone back to: - 15 mg in am - 10 mg in pm   In 06/2021, he contacted me with abdominal pain from hydrocortisone and also increased sweating.  I suggested prednisone: - 7.5 mg daily  He was also on Midodrine, now off.  No h/o po ketoconazole, phenytoin, rifampin, chronic fluconazole use. No h/o autoimmune diseases in pt or family mbs. No excess use of NSAIDs. No h/o generalized infections or HIV. No IVDA. No h/o head injury or severe HA.  At our first visit in 05/2021 he mentioned: - + weight loss - lost 20 lbs in last 1.5 mo - + fatigue - + nausea - no vomiting - + abdominal pain - + mm aches (legs and back) - no palpitations - + mild HAs - + dizziness - + presyncopal episodes (when taking a shower) - + SOB with minimal exertion >> this improved  He had slight hypokalemia lately, but no hyponatremia:   Chemistry      Component Value Date/Time   NA 139 09/02/2021 0854   K 3.6 09/02/2021 0854   CL 105 09/02/2021 0854   CO2 25 09/02/2021 0854   BUN 17 09/02/2021 0854   CREATININE 0.83 09/02/2021 0854  Component Value Date/Time   CALCIUM 9.1 09/02/2021 0854   ALKPHOS 57 09/02/2021 0854   AST 11 (L) 09/02/2021 0854   ALT 10 09/02/2021 0854   BILITOT 0.3 09/02/2021 0854     Pt. also has a history of DM2 with history of DKA.  However, this is more controlled lately: Lab Results  Component Value Date   HGBA1C 6.1 (H) 05/27/2021   HGBA1C 5.9 (H) 05/11/2021   HGBA1C 9.2 (H) 03/07/2021   He is on: - Metformin 500 >> 1000 mg 2x a day -  >> stopped due to good control -  >> stopped due to good control  He has a history of hyperlipidemia for which she is on Zocor 40 mg daily. He also has a history of pancytopenia. He had COVID-19 in 02/2021.  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   Peripheral neuropathy    toes   Past Surgical History:  Procedure Laterality Date   BRONCHIAL BIOPSY  12/10/2020   Procedure: BRONCHIAL BIOPSIES;  Surgeon: Collene Gobble, MD;  Location[facility] ENDOSCOPY;  Service: Pulmonary;;   BRONCHIAL BRUSHINGS  12/10/2020   Procedure: BRONCHIAL BRUSHINGS;  Surgeon: Collene Gobble, MD;  Location: [facility] ENDOSCOPY;  Service: Pulmonary;;   BRONCHIAL NEEDLE ASPIRATION BIOPSY  12/10/2020   Procedure: BRONCHIAL NEEDLE ASPIRATION BIOPSIES;  Surgeon: Collene Gobble, MD;  Location: [facility] ENDOSCOPY;  Service: Pulmonary;;   COLONOSCOPY     COLONOSCOPY W/ POLYPECTOMY  03/10/2009   LARYNGOSCOPY  03/10/1997   VIDEO BRONCHOSCOPY WITH ENDOBRONCHIAL NAVIGATION N/A 12/10/2020   Procedure: ROBOTIC [place];  Surgeon: Collene Gobble, MD;  Location: [facility] ENDOSCOPY;  Service: Pulmonary;  Laterality: N/A;   Social History   Socioeconomic History   Marital status: Divorced    Spouse name: Not on file   Number of children: 0   Years of education: Not on file   Highest education level: Not on file  Occupational History   Occupation: Retired Clinical biochemist  Tobacco Use   Smoking status: Every Day    Packs/day: 2.00    Years: 58.00    Total pack years: 116.00    Types: Cigarettes   Smokeless tobacco: Never   Tobacco comments:    2+ packs smoked daily ARJ 12/05/20  Vaping Use   Vaping Use: Never used  Substance and Sexual Activity   Alcohol use: No    Alcohol/week: 0.0 standard drinks of alcohol    Comment:  none since 04/2012   Drug use: Never   Sexual activity: Not on file  Other Topics Concern   Not on  file  Social History Narrative   REG EXERCISE         Social Determinants of Health   Financial Resource Strain: Low Risk  (06/26/2021)   Overall Financial Resource Strain (CARDIA)    Difficulty of Paying Living Expenses: Not hard at all  Food Insecurity: No Food Insecurity (06/26/2021)   Hunger Vital Sign    Worried About Running Out of Food in the Last Year: Never true  Ran Out of Food in the Last Year: Never true  Transportation Needs: No Transportation Needs (06/26/2021)   PRAPARE - Hydrologist (Medical): No    Lack of Transportation (Non-Medical): No  Physical Activity: Sufficiently Active (06/26/2021)   Exercise Vital Sign    Days of Exercise per Week: 3 days    Minutes of Exercise per Session: 60 min  Stress: No Stress Concern Present (06/26/2021)   [place]    Feeling of Stress : Not at all  Social Connections: Socially Isolated (06/26/2021)   Social Connection and Isolation Panel [NHANES]    Frequency of Communication with Friends and Family: More than three times a week    Frequency of Social Gatherings with Friends and Family: Three times a week    Attends Religious Services: Never    Active Member of Clubs or Organizations: No    Attends Archivist Meetings: Never    Marital Status: Divorced  Human resources officer Violence: Not At Risk (06/26/2021)   Humiliation, Afraid, Rape, and Kick questionnaire    Fear of Current or Ex-Partner: No    Emotionally Abused: No    Physically Abused: No    Sexually Abused: No   Current Outpatient Medications on File Prior to Visit  Medication Sig Dispense Refill   aspirin EC 81 MG tablet Take 81 mg by mouth in the morning. Swallow whole.     bisacodyl (DULCOLAX) 5 MG EC tablet Take 5 mg by mouth daily as needed for moderate constipation.     HYDROcodone-acetaminophen (NORCO) 5-325 MG tablet Take 1 tablet by mouth every 6 (six) hours  as needed for moderate pain. 60 tablet 0   metFORMIN (GLUCOPHAGE) 1000 MG tablet TAKE 1 TABLET(1000 MG) BY MOUTH TWICE DAILY WITH A MEAL 180 tablet 1   midodrine (PROAMATINE) 10 MG tablet Take 1 tablet (10 mg total) by mouth 3 (three) times daily with meals. (Patient not taking: Reported on 08/06/2021) 90 tablet 0   Multiple Vitamins-Minerals (CENTRUM SILVER 50+MEN) TABS Take 1 tablet by mouth daily with breakfast.     omeprazole (PRILOSEC) 20 MG capsule TAKE 1 CAPSULE BY MOUTH TWICE DAILY BEFORE A MEAL 120 capsule 3   predniSONE (DELTASONE) 5 MG tablet Take 1.5 tablets (7.5 mg total) by mouth daily with breakfast. 60 tablet 5   prochlorperazine (COMPAZINE) 10 MG tablet TAKE 1 TABLET(10 MG) BY MOUTH EVERY 6 HOURS AS NEEDED FOR NAUSEA OR VOMITING (Patient not taking: Reported on 08/06/2021) 30 tablet 0   simvastatin (ZOCOR) 40 MG tablet Take 1 tablet (40 mg total) by mouth daily at 6 PM. 90 tablet 3   No current facility-administered medications on file prior to visit.   Allergies  Allergen Reactions   Penicillins Anaphylaxis and Swelling    THE THROAT SWELLS!!!! Because of a history of documented adverse serious drug reaction;[name] Alert bracelet  is recommended   Albumin (Human) Hives and Itching   Family History  Problem Relation Age of Onset   Lung cancer Mother        X 2   Colon polyps Father    Colon cancer Father 68   Coronary artery disease Father    Lung disease Father    Esophageal cancer Neg Hx    Stomach cancer Neg Hx    Rectal cancer Neg Hx    PE: BP 130/79 (BP Location: Left Arm, Patient Position: Sitting, Cuff Size: Normal)   Pulse Marland Kitchen)  108   Ht 6' (1.829 m)   Wt 179 lb (81.2 kg)   SpO2 92%   BMI 24.28 kg/m  Wt Readings from Last 3 Encounters:  09/16/21 179 lb (81.2 kg)  09/02/21 179 lb 4 oz (81.3 kg)  08/30/21 179 lb 3.2 oz (81.3 kg)   Constitutional: overweight, in NAD Eyes: PERRLA, EOMI, no exophthalmos ENT: moist mucous membranes, no thyromegaly, no  cervical lymphadenopathy Cardiovascular: Tachycardia, RR, No MRG Respiratory: CTA B Musculoskeletal: no deformities, strength intact in all 4 Skin: moist, warm, no rashes; no dark discoloration of skin Neurological: no tremor with outstretched hands, DTR normal in all 4  ASSESSMENT: 1. Adrenal insufficiency  2. DM2, non-insulin-dependent  PLAN:  1. Adrenal insufficiency (AI) -Most likely due to steroid use (dexamethasone) used during chemotherapy for his lung cancer -His cortisol level did not stimulate well with cosyntropin (20 expected level of cortisol of >14.7), consistent with a diagnosis of adrenal insufficiency.  His ACTH was low, indicating a central etiology. He was started on hydrocortisone in the hospital -He has a history of significant dizziness, weakness, disequilibrium, which she experienced despite starting hydrocortisone.  At our first visit, he was taking 15 mg in a.m. and 10 mg in p.m.  We discussed about trying to decrease the dose slowly.  He tried 10 mg in a.m. and 10 mg in p.m. but again developed significant weakness and presented to the emergency room.  This could have been due to his radiation treatments since he presented with similar symptoms before.  However, due to this, we increased his hydrocortisone back to 15 mg in am and 10 mg in p.m. -Soon after last visit he contacted me about abdominal pain and swelling from hydrocortisone.  I suggested prednisone 7.5 mg daily.  He continues on this dose.  He tolerates it well. He feels MUCH better on this, but still has weakness which I suspect is due to deconditioning.  He agrees.  We discussed about possible exercises that he can do to build up his strength.  He felt he lost a lot of muscle mass lately.  He actually lost 21 pounds since last visit. - no N/V, but decreased appetite. He mostly eats 1 meal a day + some jello -At today's visit, we discussed about continuing prednisone.  Due to his weakness/fatigue, for now, I  would like to continue the same dose of prednisone.  I am not sure if he can come off the steroid. -We again discussed about sick day rules: If you cannot keep anything down, including your hydrocortisone medication, please go to the emergency room or your primary care physician office to get steroids injected in the muscle or vein. Alternatively, you can inject 100 mg hydrocortisone in the muscle at home. If you have a fever (more than 100 Fahrenheit) or gastroenteritis with nausea/vomiting and diarrhea, please double the dose of your hydrocortisone for the duration of the fever or the gastroenteritis. Do not run out of your hydrocortisone medication. -At last visit I sent hydrocortisone solution to his pharmacy for when he cannot take p.o. Unfortunately, hydrocortisone and dexamethasone are not available currently so he will need to go to the nearest emergency room or urgent care to get steroids parenterally if needed. -I advised him to get a med alert bracelet mentioning adrenal insufficiency.  He has a wallet card. -I plan to see him back in 6 months  2. DM -Reviewed latest HbA1c -at goal Lab Results  Component Value Date   HGBA1C  6.1 (H) 05/27/2021  -He was previously on insulin and SGLT2 inhibitor, but these were stopped due to good control -He is currently on metformin only -Followed by PCP  Philemon Kingdom, MD PhD [facility] Endocrinology

## 2021-09-27 ENCOUNTER — Encounter (HOSPITAL_COMMUNITY): Payer: Self-pay

## 2021-09-27 ENCOUNTER — Ambulatory Visit (HOSPITAL_COMMUNITY)
Admission: RE | Admit: 2021-09-27 | Discharge: 2021-09-27 | Disposition: A | Discharge status: Home / Self Care | Payer: Medicare Other | Source: Ambulatory Visit | Attending: Internal Medicine | Admitting: Internal Medicine

## 2021-09-27 DIAGNOSIS — C3490 Malignant neoplasm of unspecified part of unspecified bronchus or lung: Secondary | ICD-10-CM | POA: Diagnosis not present

## 2021-09-27 DIAGNOSIS — J432 Centrilobular emphysema: Secondary | ICD-10-CM | POA: Diagnosis not present

## 2021-09-27 DIAGNOSIS — J90 Pleural effusion, not elsewhere classified: Secondary | ICD-10-CM | POA: Diagnosis not present

## 2021-09-27 DIAGNOSIS — R911 Solitary pulmonary nodule: Secondary | ICD-10-CM | POA: Diagnosis not present

## 2021-09-27 MED ORDER — IOHEXOL 300 MG/ML  SOLN
75.0000 mL | Freq: Once | INTRAMUSCULAR | Status: AC | PRN
  Administered 2021-09-27: 75 mL via INTRAVENOUS

## 2021-09-27 MED ORDER — SODIUM CHLORIDE (PF) 0.9 % IJ SOLN
INTRAMUSCULAR | Status: AC
  Filled 2021-09-27: qty 50

## 2021-09-27 NOTE — Progress Notes (Unsigned)
[facility] OFFICE PROGRESS NOTE  Brandon Koch, MD [place] [id number]  DIAGNOSIS:  Stage IIIA (T4, N0, M0) non-small cell lung cancer, favoring adenocarcinoma presented with large right lower lobe lung mass with no mediastinal lymphadenopathy or extrathoracic metastasis diagnosed in October 2022. Had disease progression with enlarging mass and new right hilar and mediastinal lymph nodes in December 2022.   PRIOR THERAPY: 1) Neoadjuvant treatment with carboplatin for AUC of 5, Alimta 500 Mg/M2 and nivolumab 360 Mg IV every 3 weeks.  First dose January 23, 2021.  Status post 3 cycles. 2) Concurrent chemoradiation with carboplatin for an AUC of 2 and paclitaxel 45 mg per metered squared.  First dose expected on 04/01/2021.  Status post 4 cycles  CURRENT THERAPY: Consolidation treatment with immunotherapy with Imfinzi 1500 Mg IV every 4 weeks.  First dose on Jul 08, 2021.  Status post 3 cycles.   INTERVAL HISTORY: Brandon Rubio. 71 y.o. male returns to the clinic today for a follow-up visit.  The patient is feeling fairly well today without any concerning complaints except for fatigue.  He reports he used to be active previously but is not anymore.  He is concerned that if he increases his activity it is going to exacerbate back pain.  He reports that he saw his PCP recently for some back discomfort who also encouraged him to slowly build up his exercise tolerance and felt it was musculoskeletal in nature.Marland Kitchen  He takes a half a tablet of Norco if needed for back pain.  Of note the patient does not have any metastatic bone lesions.  Otherwise the patient mentions he has a broken tooth and needs to get dental work performed.  He is wondering when he is able to get that done while he is on immunotherapy.  The patient is followed closely by endocrinology for adrenal insufficiency.   The patient is currently undergoing immunotherapy with Imfinzi.  He is status  post 3 cycles and has been tolerating it fairly well without any concerning adverse side effects besides fatigue. Today he denies any fever or night sweats.  He is scheduled to see a member the nutritionist team while in the infusion room today for prior decreased appetite and weight loss.  He reports he has a cough which produces clear sputum.  He is not taking anything for cough or mucus production. He reported he feels like his lungs are "full of fluid".   He denies any chest pain or hemoptysis.  Reports stable dyspnea on exertion.  Denies any nausea, vomiting, diarrhea, or constipation.  Denies any headache or visual changes.  Denies any rashes or skin changes.  The patient recently had a restaging CT scan performed.  He is here today for evaluation to review his scan results before starting cycle #4.     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  MEDICATIONS:  Current Outpatient Medications  Medication Sig Dispense Refill  aspirin EC 81 MG tablet Take 81 mg by mouth in the morning. Swallow whole.     bisacodyl (DULCOLAX) 5 MG EC tablet Take 5 mg by mouth daily as needed for moderate constipation.     HYDROcodone-acetaminophen (NORCO) 5-325 MG tablet Take 1 tablet by mouth every 6 (six) hours as needed for moderate pain. 60 tablet 0   metFORMIN (GLUCOPHAGE) 1000 MG tablet TAKE 1 TABLET(1000 MG) BY MOUTH TWICE DAILY WITH A MEAL 180 tablet 1   midodrine (PROAMATINE) 10 MG tablet Take 1 tablet (10 mg total) by mouth 3 (three) times daily with meals.  (Patient not taking: Reported on 08/06/2021) 90 tablet 0   Multiple Vitamins-Minerals (CENTRUM SILVER 50+MEN) TABS Take 1 tablet by mouth daily with breakfast.     omeprazole (PRILOSEC) 20 MG capsule TAKE 1 CAPSULE BY MOUTH TWICE DAILY BEFORE A MEAL 120 capsule 3   predniSONE (DELTASONE) 5 MG tablet Take 1.5 tablets (7.5 mg total) by mouth daily with breakfast. 60 tablet 5   prochlorperazine (COMPAZINE) 10 MG tablet TAKE 1 TABLET(10 MG) BY MOUTH EVERY 6 HOURS AS NEEDED FOR NAUSEA OR VOMITING (Patient not taking: Reported on 08/06/2021) 30 tablet 0   simvastatin (ZOCOR) 40 MG tablet TAKE 1 TABLET(40 MG) BY MOUTH DAILY AT 6 PM 90 tablet 3   No current facility-administered medications for this visit.    SURGICAL HISTORY:  Past Surgical History:  Procedure Laterality Date   BRONCHIAL BIOPSY  12/10/2020   Procedure: BRONCHIAL BIOPSIES;  Surgeon: Collene Gobble, MD;  Location: [facility] ENDOSCOPY;  Service: Pulmonary;;   BRONCHIAL BRUSHINGS  12/10/2020   Procedure: BRONCHIAL BRUSHINGS;  Surgeon: Collene Gobble, MD;  Location: [facility] ENDOSCOPY;  Service: Pulmonary;;   BRONCHIAL NEEDLE ASPIRATION BIOPSY  12/10/2020   Procedure: BRONCHIAL NEEDLE ASPIRATION BIOPSIES;  Surgeon: Collene Gobble, MD;  Location: [facility] ENDOSCOPY;  Service: Pulmonary;;   COLONOSCOPY     COLONOSCOPY W/ POLYPECTOMY  03/10/2009   LARYNGOSCOPY  03/10/1997   VIDEO BRONCHOSCOPY WITH ENDOBRONCHIAL NAVIGATION N/A 12/10/2020   Procedure: ROBOTIC [place];  Surgeon: Collene Gobble, MD;  Location: [facility] ENDOSCOPY;  Service: Pulmonary;  Laterality: N/A;    REVIEW OF SYSTEMS:   Review of Systems  Constitutional: Positive for fatigue.  Negative for appetite change, chills,  fever and unexpected weight change.  HENT: Negative for mouth sores, nosebleeds, sore throat and trouble swallowing.   Eyes: Negative for eye problems and icterus.  Respiratory: Positive for cough and dyspnea exertion.  Negative for  hemoptysis and wheezing.   Cardiovascular: Negative for chest pain and leg swelling.  Gastrointestinal: Negative for abdominal pain, constipation, diarrhea, nausea and vomiting.  Genitourinary: Negative for bladder incontinence, difficulty urinating, dysuria, frequency and hematuria.   Musculoskeletal: Positive for occasional back and right shoulder pain.  Negative for gait problem, neck pain and neck stiffness.  Skin: Negative for itching and rash.  Neurological: Negative for dizziness, extremity weakness, gait problem, headaches, light-headedness and seizures.  Hematological: Negative for adenopathy. Does not bruise/bleed easily.  Psychiatric/Behavioral: Negative for confusion, depression and sleep disturbance. The patient is not nervous/anxious.     PHYSICAL EXAMINATION:  Blood pressure 115/68, pulse 96, temperature 97.7 F (36.5 C), temperature source Temporal, resp. rate 18, height 6' (1.829 m), weight 180 lb 8 oz (81.9 kg), SpO2 97 %.  ECOG PERFORMANCE STATUS: 1  Physical Exam  Constitutional: Oriented to person, place, and time and well-developed, well-nourished, and in no distress.  HENT:  Head: Normocephalic and  atraumatic.  Mouth/Throat: Oropharynx is clear and moist. No oropharyngeal exudate.  Eyes: Conjunctivae are normal. Right eye exhibits no discharge. Left eye exhibits no discharge. No scleral icterus.  Neck: Normal range of motion. Neck supple.  Cardiovascular: Normal rate, regular rhythm, normal heart sounds and intact distal pulses.   Pulmonary/Chest: Effort normal and breath sounds normal. No respiratory distress. No wheezes. No rales.  Abdominal: Soft. Bowel sounds are normal. Exhibits no distension and no mass. There is no tenderness.  Musculoskeletal: Normal range of motion. Exhibits no edema.  Lymphadenopathy:    No cervical adenopathy.  Neurological: Alert and oriented to person, place, and time. Exhibits normal muscle tone. Gait normal. Coordination normal.   Skin: Skin is warm and dry. No rash noted. Not diaphoretic. No erythema. No pallor.  Psychiatric: Mood, memory and judgment normal.  Vitals reviewed.  LABORATORY DATA: Lab Results  Component Value Date   WBC 8.5 10/01/2021   HGB 11.9 (L) 10/01/2021   HCT 34.1 (L) 10/01/2021   MCV 85.9 10/01/2021   PLT 221 10/01/2021      Chemistry      Component Value Date/Time   NA 138 10/01/2021 1026   K 3.6 10/01/2021 1026   CL 103 10/01/2021 1026   CO2 26 10/01/2021 1026   BUN 9 10/01/2021 1026   CREATININE 0.75 10/01/2021 1026      Component Value Date/Time   CALCIUM 9.2 10/01/2021 1026   ALKPHOS 58 10/01/2021 1026   AST 14 (L) 10/01/2021 1026   ALT 10 10/01/2021 1026   BILITOT 0.4 10/01/2021 1026       RADIOGRAPHIC STUDIES:  CT Chest W Contrast  Result Date: 09/27/2021 CLINICAL DATA:  Primary Cancer Type: Lung Imaging Indication: Assess response to therapy Interval therapy since last imaging? Yes Initial Cancer Diagnosis Date: 12/10/2020; Established by: Biopsy-proven Detailed Pathology: Stage IIIA non-small cell lung cancer, favoring adenocarcinoma. Primary Tumor location:  Right lower lobe. Surgeries: No. Chemotherapy: Yes; Ongoing? No; Most recent administration: 04/22/2021 Immunotherapy?  Yes; Type: Imfinzi; Ongoing? Yes Radiation therapy? Yes; Date Range: 04/01/2021 - 05/27/2021; Target: Right lower lung * Tracking Code: BO * EXAM: CT CHEST WITH CONTRAST TECHNIQUE: Multidetector CT imaging of the chest was performed during intravenous contrast administration. RADIATION DOSE REDUCTION: This exam was performed according to the departmental dose-optimization program which includes automated exposure control, adjustment of the mA and/or kV according to patient size and/or use of iterative reconstruction technique. CONTRAST:  52mL OMNIPAQUE IOHEXOL 300 MG/ML  SOLN COMPARISON:  Most recent CT chest 06/27/2021. 11/28/2020 PET-CT. FINDINGS: Cardiovascular: The heart size appears within  normal limits. Similar volume of trace pericardial fluid noted anteriorly. Aortic atherosclerosis and coronary artery calcifications. Mediastinum/Nodes: Thyroid gland, trachea and esophagus are unremarkable. No enlarged axillary, supraclavicular, mediastinal, or hilar lymph nodes. Lungs/Pleura: There is a small loculated right pleural effusion which appears increased in volume from the previous exam. Moderate to severe changes of centrilobular and paraseptal emphysema. Fibrosis and architectural distortion within the posterior right upper lobe and right lower lobe is again noted compatible with changes secondary to external beam radiation. The pleural base mass within the posterior right lower lobe measures 6.2 x 3.0 by 6.4 cm, image 91/7 and image 71/6. On the exam from 06/27/2021 this measured 6.6 x 4.1 by 8.9 cm. On the exam from 03/07/2021 this mass measured 1.0 x 6.4 x 13.8 cm. Over the course of these 3 exams the mass has become progressively less solid. However, along the inferior margin of this mass there  has been progressive soft tissue thickening of the peribronchovascular interstitium and interlobular septae, image 103/7. There is a new nodule within the left lower lobe measuring 3 mm, image 84/7. Nonspecific. Upper Abdomen: No acute abnormality. No adrenal mass identified. Benign Bosniak class 1 cyst arises off the posterior aspect of the left kidney measuring 3.3 cm. No follow-up imaging recommended, image 141/2. Musculoskeletal: No chest wall abnormality. No acute or significant osseous findings. IMPRESSION: 1. Continued interval decrease in size of the left lower lobe pleural base. This appears progressively less solid when compared with the previous imaging. 2. Increased volume of right pleural effusion which appears partially loculated. This now extends over the posterior basal right upper lobe and along the posterior major and minor fissures. Additionally, there has been progressive soft tissue  thickening of the interlobular septae and peribronchovascular interstitium within the right lung base. These findings are favored to reflect progressive changes of external beam radiation. However, progressive loculated pleural fluid along with progressive interstitial thickening may be seen in the setting of recurrent disease. For this reason continued close interval follow-up is recommended. 3. New tiny lung nodule within the left lower lobe measures 3 mm. Attention on follow-up imaging is advised. Electronically Signed   By: Kerby Moors M.D.   On: 09/27/2021 12:10     ASSESSMENT/PLAN:  This is a very pleasant 71 year old Caucasian male with a history of stage IIIa (T4, N1, M0) non-small cell lung cancer, adenocarcinoma.  He presented with right upper lobe lung mass with right hilar and no mediastinal lymphadenopathy.  He did not have any evidence of extrathoracic disease.  He was diagnosed October 2022.  He is status post 3 cycles of neoadjuvant systemic chemotherapy with carboplatin, Alimta, nivolumab.  Unfortunately he had no significant improvement in his disease and actually developed disease progression.  He then underwent a course of concurrent chemoradiation.  He is currently undergoing treatment with consolidation immunotherapy with Imfinzi 1500 mg IV every 4 weeks.  He is status post 3 cycles.  The patient recently had a restaging CT scan performed.  Dr. Julien Nordmann personally and independently reviewed the scan and discussed results with the patient today.  The scan did not show any evidence of disease progression.  The scan showed continued interval decrease in the right lower lobe pleural-based nodule. (We will reach out to radiology to add an addendum to the report which states left lower lobe).  There is increased volume of the right pleural effusion which appears partially loculated.  Overall, this is a very small volume of fluid which we will monitor on future imaging.  Radiology feels  this favor progressive changes from the radiation however warrants close monitoring for disease recurrence.  There is not enough fluid for thoracentesis.  Dr. Julien Nordmann discussed that this may cause some achiness in the right lung which may be referred to the right shoulder.  The patient is prescribed Norco by his PCP for arthritis.  For the patient's cough and clear mucus production, I discussed with him that he may use Mucinex or cough suppressant such as Delsym or Robitussin if needed.  No signs of infection were seen on CT scan.  Dr. Julien Nordmann recommends that he continue on the same treatment at the same dose.  He will proceed with cycle #4 today scheduled.  We will see him back for follow-up visit in 4 weeks for evaluation and repeat blood work before starting cycle #5.  We will continue to follow with endocrinology regarding his adrenal insufficiency.  He is scheduled to see a member the nutritionist team while in the infusion room today  The patient and I had a discussion about slowly increasing his activity for his fatigue.  Encouraged pool exercises, foot petal exercises, and walking.  If needed for his back, I also encouraged the patient to purchase a heating pad.  Dr. Julien Nordmann discussed with the patient that he can use lidocaine patches over-the-counter or Salonpas patches.  Regarding his dental work, if the patient is going to have this done I would encourage him to do this in the middle of his cycle.  The patient was advised to call immediately if he has any concerning symptoms in the interval. The patient voices understanding of current disease status and treatment options and is in agreement with the current care plan. All questions were answered. The patient knows to call the clinic with any problems, questions or concerns. We can certainly see the patient much sooner if necessary  No orders of the defined types were placed in this encounter.    Jmya Uliano L Kylar Speelman,  PA-C 10/01/21  ADDENDUM: Hematology/Oncology Attending: I had a face-to-face encounter with the patient today.  I reviewed his record, lab, scan and recommended his care plan.  This is a very pleasant 71 years old white male with history of stage IIIa (T4, N1, M0) non-small cell lung cancer, adenocarcinoma status post 3 cycles of neoadjuvant systemic chemotherapy with carboplatin, Alimta and nivolumab but the patient did not have response to this treatment.  He was then treated with a course of concurrent chemoradiation with weekly carboplatin and paclitaxel with partial response. He is currently undergoing consolidation treatment with immunotherapy with Imfinzi 1500 Mg IV every 4 weeks status post 3 cycles.  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 He had repeat CT scan of the chest performed recently.  I personally and independently reviewed the scan images and discussed results with the patient today. His scan showed no concerning findings for disease progression and there was some further decrease in the right lower lobe lung mass. I recommended for the patient to continue his current treatment with consolidation Imfinzi and he will proceed with cycle #4 today. I will see him back for follow-up visit in 4 weeks for evaluation before the next cycle of his treatment. The patient was advised to call immediately if he has any other concerning symptoms in the interval. The total time spent in the appointment was 30 minutes. Disclaimer: This note was dictated with voice recognition software. Similar sounding words can inadvertently be transcribed and may be missed upon review. Eilleen Kempf, MD

## 2021-09-30 ENCOUNTER — Other Ambulatory Visit: Payer: Self-pay

## 2021-10-01 ENCOUNTER — Inpatient Hospital Stay: Payer: Medicare Other | Attending: Internal Medicine

## 2021-10-01 ENCOUNTER — Inpatient Hospital Stay (HOSPITAL_BASED_OUTPATIENT_CLINIC_OR_DEPARTMENT_OTHER): Payer: Medicare Other | Admitting: Physician Assistant

## 2021-10-01 ENCOUNTER — Inpatient Hospital Stay: Payer: Medicare Other

## 2021-10-01 ENCOUNTER — Inpatient Hospital Stay: Payer: Medicare Other | Admitting: Dietician

## 2021-10-01 ENCOUNTER — Other Ambulatory Visit: Payer: Self-pay

## 2021-10-01 VITALS — BP 103/56 | HR 90 | Temp 97.9°F | Resp 16

## 2021-10-01 VITALS — BP 115/68 | HR 96 | Temp 97.7°F | Resp 18 | Ht 72.0 in | Wt 180.5 lb

## 2021-10-01 DIAGNOSIS — Z7182 Exercise counseling: Secondary | ICD-10-CM | POA: Insufficient documentation

## 2021-10-01 DIAGNOSIS — R5382 Chronic fatigue, unspecified: Secondary | ICD-10-CM

## 2021-10-01 DIAGNOSIS — C3491 Malignant neoplasm of unspecified part of right bronchus or lung: Secondary | ICD-10-CM

## 2021-10-01 DIAGNOSIS — Z923 Personal history of irradiation: Secondary | ICD-10-CM | POA: Diagnosis not present

## 2021-10-01 DIAGNOSIS — Z79899 Other long term (current) drug therapy: Secondary | ICD-10-CM | POA: Insufficient documentation

## 2021-10-01 DIAGNOSIS — Z5112 Encounter for antineoplastic immunotherapy: Secondary | ICD-10-CM | POA: Diagnosis not present

## 2021-10-01 DIAGNOSIS — E2740 Unspecified adrenocortical insufficiency: Secondary | ICD-10-CM | POA: Insufficient documentation

## 2021-10-01 DIAGNOSIS — C3431 Malignant neoplasm of lower lobe, right bronchus or lung: Secondary | ICD-10-CM | POA: Diagnosis not present

## 2021-10-01 LAB — CBC WITH DIFFERENTIAL (CANCER CENTER ONLY)
Abs Immature Granulocytes: 0.03 10*3/uL (ref 0.00–0.07)
Basophils Absolute: 0.1 10*3/uL (ref 0.0–0.1)
Basophils Relative: 1 %
Eosinophils Absolute: 0.2 10*3/uL (ref 0.0–0.5)
Eosinophils Relative: 2 %
HCT: 34.1 % — ABNORMAL LOW (ref 39.0–52.0)
Hemoglobin: 11.9 g/dL — ABNORMAL LOW (ref 13.0–17.0)
Immature Granulocytes: 0 %
Lymphocytes Relative: 13 %
Lymphs Abs: 1.1 10*3/uL (ref 0.7–4.0)
MCH: 30 pg (ref 26.0–34.0)
MCHC: 34.9 g/dL (ref 30.0–36.0)
MCV: 85.9 fL (ref 80.0–100.0)
Monocytes Absolute: 0.6 10*3/uL (ref 0.1–1.0)
Monocytes Relative: 7 %
Neutro Abs: 6.5 10*3/uL (ref 1.7–7.7)
Neutrophils Relative %: 77 %
Platelet Count: 221 10*3/uL (ref 150–400)
RBC: 3.97 MIL/uL — ABNORMAL LOW (ref 4.22–5.81)
RDW: 13.2 % (ref 11.5–15.5)
WBC Count: 8.5 10*3/uL (ref 4.0–10.5)
nRBC: 0 % (ref 0.0–0.2)

## 2021-10-01 LAB — CMP (CANCER CENTER ONLY)
ALT: 10 U/L (ref 0–44)
AST: 14 U/L — ABNORMAL LOW (ref 15–41)
Albumin: 3.9 g/dL (ref 3.5–5.0)
Alkaline Phosphatase: 58 U/L (ref 38–126)
Anion gap: 9 (ref 5–15)
BUN: 9 mg/dL (ref 8–23)
CO2: 26 mmol/L (ref 22–32)
Calcium: 9.2 mg/dL (ref 8.9–10.3)
Chloride: 103 mmol/L (ref 98–111)
Creatinine: 0.75 mg/dL (ref 0.61–1.24)
GFR, Estimated: >60 mL/min (ref >60)
Glucose, Bld: 126 mg/dL — ABNORMAL HIGH (ref 70–99)
Potassium: 3.6 mmol/L (ref 3.5–5.1)
Sodium: 138 mmol/L (ref 135–145)
Total Bilirubin: 0.4 mg/dL (ref 0.3–1.2)
Total Protein: 6.5 g/dL (ref 6.5–8.1)

## 2021-10-01 LAB — TSH: TSH: 1.785 u[IU]/mL (ref 0.350–4.500)

## 2021-10-01 MED ORDER — SODIUM CHLORIDE 0.9 % IV SOLN: Freq: Once | INTRAVENOUS | Status: AC

## 2021-10-01 MED ORDER — SODIUM CHLORIDE 0.9 % IV SOLN
1500.0000 mg | Freq: Once | INTRAVENOUS | Status: AC
  Administered 2021-10-01: 1500 mg via INTRAVENOUS
  Filled 2021-10-01: qty 30

## 2021-10-01 NOTE — Progress Notes (Signed)
Nutrition Follow-up:  Patient with small cell lung cancer. He is currently receiving Imfinzi q4w (first 07/08/21).  Met with patient in infusion. He reports pleasantly surprised and feeling good after MD visit today. Pt shares recent PET shows stable disease. He continues to have decreased appetite, says he is unable to eat much at one time. Patient reports he has implemented a few suggestions to improve overall intake including snacks and frozen entrees. He has ongoing chronic back pain and is followed by PCP for this. Patient has exercises he can complete at home but this causes him more pain. Patient denies nausea, vomiting, diarrhea, constipation  Medications: Norco  Labs: glucose 126  Anthropometrics: Weight 180 lb 8 oz today stable  7/10 - 179 lb  6/26 - 179 lb 4 oz  5/30 - 186 lb 12.8 oz    NUTRITION DIAGNOSIS: Severe malnutrition continues    INTERVENTION:  Continue strategies for increasing calories and protein with small frequent meals/snacks Suggested keeping high protein snacks within reach in rooms he spends time (protein bars, peanut butter crackers, trail mix) Encouraged pt to consider PCP offer to order PT for his back  Patient will work to increase intake of water  MONITORING, EVALUATION, GOAL: weight trends, intake    NEXT VISIT: Tuesday August 22 during infusion

## 2021-10-01 NOTE — Patient Instructions (Signed)
[facility]  Discharge Instructions: Thank you for choosing [facility] to provide your oncology and hematology care.   If you have a lab appointment with the [facility], please go directly to the [facility] and check in at the registration area.   Wear comfortable clothing and clothing appropriate for easy access to any Portacath or PICC line.   We strive to give you quality time with your provider. You may need to reschedule your appointment if you arrive late (15 or more minutes).  Arriving late affects you and other patients whose appointments are after yours.  Also, if you miss three or more appointments without notifying the office, you may be dismissed from the clinic at the provider's discretion.      For prescription refill requests, have your pharmacy contact our office and allow 72 hours for refills to be completed.    Today you received the following chemotherapy and/or immunotherapy agents: Durvalumab      To help prevent nausea and vomiting after your treatment, we encourage you to take your nausea medication as directed.  BELOW ARE SYMPTOMS THAT SHOULD BE REPORTED IMMEDIATELY: *FEVER GREATER THAN 100.4 F (38 C) OR HIGHER *CHILLS OR SWEATING *NAUSEA AND VOMITING THAT IS NOT CONTROLLED WITH YOUR NAUSEA MEDICATION *UNUSUAL SHORTNESS OF BREATH *UNUSUAL BRUISING OR BLEEDING *URINARY PROBLEMS (pain or burning when urinating, or frequent urination) *BOWEL PROBLEMS (unusual diarrhea, constipation, pain near the anus) TENDERNESS IN MOUTH AND THROAT WITH OR WITHOUT PRESENCE OF ULCERS (sore throat, sores in mouth, or a toothache) UNUSUAL RASH, SWELLING OR PAIN  UNUSUAL VAGINAL DISCHARGE OR ITCHING   Items with * indicate a potential emergency and should be followed up as soon as possible or go to the Emergency Department if any problems should occur.  Please show the CHEMOTHERAPY ALERT CARD or IMMUNOTHERAPY ALERT CARD at check-in to  the Emergency Department and triage nurse.  Should you have questions after your visit or need to cancel or reschedule your appointment, please contact [facility]  Dept: [phone]  and follow the prompts.  Office hours are 8:00 a.m. to 4:30 p.m. Monday - Friday. Please note that voicemails left after 4:00 p.m. may not be returned until the following business day.  We are closed weekends and major holidays. You have access to a nurse at all times for urgent questions. Please call the main number to the clinic Dept: [phone] and follow the prompts.   For any non-urgent questions, you may also contact your provider using MyChart. We now offer e-Visits for anyone 83 and older to request care online for non-urgent symptoms. For details visit [email or web address].   Also download the MyChart app! Go to the app store, search "MyChart", open the app, select [facility], and log in with your MyChart username and password.  Masks are optional in the cancer centers. If you would like for your care team to wear a mask while they are taking care of you, please let them know. For doctor visits, patients may have with them one support person who is at least 73 years old. At this time, visitors are not allowed in the infusion area. Durvalumab injection What is this medication? DURVALUMAB (dur VAL ue mab) is a monoclonal antibody. It is used to treat lung cancer. This medicine may be used for other purposes; ask your health care provider or pharmacist if you have questions. COMMON BRAND NAME(S): IMFINZI What should I tell my care  team before I take this medication? They need to know if you have any of these conditions: autoimmune diseases like Crohn's disease, ulcerative colitis, or lupus have had or planning to have an allogeneic stem cell transplant (uses someone else's stem cells) history of organ transplant history of radiation to the chest nervous system problems  like myasthenia gravis or Guillain-Barre syndrome an unusual or allergic reaction to durvalumab, other medicines, foods, dyes, or preservatives pregnant or trying to get pregnant breast-feeding How should I use this medication? This medicine is for infusion into a vein. It is given by a health care professional in a hospital or clinic setting. A special MedGuide will be given to you before each treatment. Be sure to read this information carefully each time. Talk to your pediatrician regarding the use of this medicine in children. Special care may be needed. Overdosage: If you think you have taken too much of this medicine contact a poison control center or emergency room at once. NOTE: This medicine is only for you. Do not share this medicine with others. What if I miss a dose? It is important not to miss your dose. Call your doctor or health care professional if you are unable to keep an appointment. What may interact with this medication? Interactions have not been studied. This list may not describe all possible interactions. Give your health care provider a list of all the medicines, herbs, non-prescription drugs, or dietary supplements you use. Also tell them if you smoke, drink alcohol, or use illegal drugs. Some items may interact with your medicine. What should I watch for while using this medication? This medication may make you feel generally unwell. Continue your course of treatment even though you feel ill unless your care team tells you to stop. You may need blood work done while you are taking this medication. Do not become pregnant while taking this medication or for 3 months after stopping it. Women should inform their care team if they wish to become pregnant or think they might be pregnant. There is a potential for serious side effects to an unborn child. Talk to your care team or pharmacist for more information. Do not breast-feed an infant while taking this medication or for 3  months after stopping it. What side effects may I notice from receiving this medication? Side effects that you should report to your care team as soon as possible: Allergic reactions--skin rash, itching, hives, swelling of the face, lips, tongue, or throat Bloody or watery diarrhea Dizziness, loss of balance or coordination, confusion or trouble speaking Dry cough, shortness of breath or trouble breathing Flushing, mostly over the face, neck, and chest, during injection High blood sugar (hyperglycemia)--increased thirst or amount of urine, unusual weakness or fatigue, blurry vision High thyroid levels (hyperthyroidism)--fast or irregular heartbeat, weight loss, excessive sweating or sensitivity to heat, tremors or shaking, anxiety, nervousness, irregular menstrual cycle or spotting Infection--fever, chills, cough, or sore throat Liver injury--right upper belly pain, loss of appetite, nausea, light-colored stool, dark yellow or brown urine, yellowing skin or eyes, unusual weakness or fatigue Low adrenal gland function--nausea, vomiting, loss of appetite, unusual weakness or fatigue, dizziness, low blood pressure Low thyroid levels (hypothyroidism)--unusual weakness or fatigue, increased sensitivity to cold, constipation, hair loss, dry skin, weight gain, feelings of depression Pancreatitis--severe stomach pain that spreads to your back or gets worse after eating or when touched, fever, nausea, vomiting Rash, fever, and swollen lymph nodes Redness, blistering, peeling or loosening of the skin, including inside the  mouth Wheezing--trouble breathing with loud or whistling sounds Side effects that usually do not require medical attention (report these to your care team if they continue or are bothersome): Fatigue Hair loss This list may not describe all possible side effects. Call your doctor for medical advice about side effects. You may report side effects to FDA at 1-800-FDA-1088. Where should I  keep my medication? This medication is given in a hospital or clinic. It will not be stored at home. NOTE: This sheet is a summary. It may not cover all possible information. If you have questions about this medicine, talk to your doctor, pharmacist, or health care provider.  2023 Elsevier/Gold Standard (2021-01-25 00:00:00)

## 2021-10-29 ENCOUNTER — Inpatient Hospital Stay: Payer: Medicare Other

## 2021-10-29 ENCOUNTER — Inpatient Hospital Stay: Payer: Medicare Other | Admitting: Dietician

## 2021-10-29 ENCOUNTER — Inpatient Hospital Stay (HOSPITAL_BASED_OUTPATIENT_CLINIC_OR_DEPARTMENT_OTHER): Payer: Medicare Other | Admitting: Internal Medicine

## 2021-10-29 ENCOUNTER — Other Ambulatory Visit: Payer: Self-pay

## 2021-10-29 ENCOUNTER — Inpatient Hospital Stay: Payer: Medicare Other | Attending: Internal Medicine

## 2021-10-29 VITALS — BP 110/92 | HR 95 | Temp 98.3°F | Resp 18 | Wt 181.2 lb

## 2021-10-29 DIAGNOSIS — Z9221 Personal history of antineoplastic chemotherapy: Secondary | ICD-10-CM | POA: Insufficient documentation

## 2021-10-29 DIAGNOSIS — Z5112 Encounter for antineoplastic immunotherapy: Secondary | ICD-10-CM | POA: Diagnosis not present

## 2021-10-29 DIAGNOSIS — C3491 Malignant neoplasm of unspecified part of right bronchus or lung: Secondary | ICD-10-CM

## 2021-10-29 DIAGNOSIS — C3431 Malignant neoplasm of lower lobe, right bronchus or lung: Secondary | ICD-10-CM | POA: Insufficient documentation

## 2021-10-29 DIAGNOSIS — R5382 Chronic fatigue, unspecified: Secondary | ICD-10-CM

## 2021-10-29 DIAGNOSIS — Z79899 Other long term (current) drug therapy: Secondary | ICD-10-CM | POA: Insufficient documentation

## 2021-10-29 LAB — CBC WITH DIFFERENTIAL (CANCER CENTER ONLY)
Abs Immature Granulocytes: 0.04 10*3/uL (ref 0.00–0.07)
Basophils Absolute: 0.1 10*3/uL (ref 0.0–0.1)
Basophils Relative: 1 %
Eosinophils Absolute: 0.2 10*3/uL (ref 0.0–0.5)
Eosinophils Relative: 2 %
HCT: 33.6 % — ABNORMAL LOW (ref 39.0–52.0)
Hemoglobin: 11.6 g/dL — ABNORMAL LOW (ref 13.0–17.0)
Immature Granulocytes: 0 %
Lymphocytes Relative: 10 %
Lymphs Abs: 1 10*3/uL (ref 0.7–4.0)
MCH: 29.9 pg (ref 26.0–34.0)
MCHC: 34.5 g/dL (ref 30.0–36.0)
MCV: 86.6 fL (ref 80.0–100.0)
Monocytes Absolute: 0.6 10*3/uL (ref 0.1–1.0)
Monocytes Relative: 6 %
Neutro Abs: 8.8 10*3/uL — ABNORMAL HIGH (ref 1.7–7.7)
Neutrophils Relative %: 81 %
Platelet Count: 225 10*3/uL (ref 150–400)
RBC: 3.88 MIL/uL — ABNORMAL LOW (ref 4.22–5.81)
RDW: 13.4 % (ref 11.5–15.5)
WBC Count: 10.7 10*3/uL — ABNORMAL HIGH (ref 4.0–10.5)
nRBC: 0 % (ref 0.0–0.2)

## 2021-10-29 LAB — CMP (CANCER CENTER ONLY)
ALT: 9 U/L (ref 0–44)
AST: 11 U/L — ABNORMAL LOW (ref 15–41)
Albumin: 4 g/dL (ref 3.5–5.0)
Alkaline Phosphatase: 47 U/L (ref 38–126)
Anion gap: 6 (ref 5–15)
BUN: 13 mg/dL (ref 8–23)
CO2: 27 mmol/L (ref 22–32)
Calcium: 9.2 mg/dL (ref 8.9–10.3)
Chloride: 105 mmol/L (ref 98–111)
Creatinine: 0.78 mg/dL (ref 0.61–1.24)
GFR, Estimated: >60 mL/min (ref >60)
Glucose, Bld: 138 mg/dL — ABNORMAL HIGH (ref 70–99)
Potassium: 3.8 mmol/L (ref 3.5–5.1)
Sodium: 138 mmol/L (ref 135–145)
Total Bilirubin: 0.3 mg/dL (ref 0.3–1.2)
Total Protein: 6.6 g/dL (ref 6.5–8.1)

## 2021-10-29 LAB — TSH: TSH: 1.607 u[IU]/mL (ref 0.350–4.500)

## 2021-10-29 MED ORDER — SODIUM CHLORIDE 0.9 % IV SOLN
1500.0000 mg | Freq: Once | INTRAVENOUS | Status: AC
  Administered 2021-10-29: 1500 mg via INTRAVENOUS
  Filled 2021-10-29: qty 30

## 2021-10-29 MED ORDER — SODIUM CHLORIDE 0.9 % IV SOLN: Freq: Once | INTRAVENOUS | Status: AC

## 2021-10-29 NOTE — Patient Instructions (Signed)
[facility]  Discharge Instructions: Thank you for choosing [facility] to provide your oncology and hematology care.   If you have a lab appointment with the [facility], please go directly to the [facility] and check in at the registration area.   Wear comfortable clothing and clothing appropriate for easy access to any Portacath or PICC line.   We strive to give you quality time with your provider. You may need to reschedule your appointment if you arrive late (15 or more minutes).  Arriving late affects you and other patients whose appointments are after yours.  Also, if you miss three or more appointments without notifying the office, you may be dismissed from the clinic at the provider's discretion.      For prescription refill requests, have your pharmacy contact our office and allow 72 hours for refills to be completed.    Today you received the following chemotherapy and/or immunotherapy agents: Durvalumab      To help prevent nausea and vomiting after your treatment, we encourage you to take your nausea medication as directed.  BELOW ARE SYMPTOMS THAT SHOULD BE REPORTED IMMEDIATELY: *FEVER GREATER THAN 100.4 F (38 C) OR HIGHER *CHILLS OR SWEATING *NAUSEA AND VOMITING THAT IS NOT CONTROLLED WITH YOUR NAUSEA MEDICATION *UNUSUAL SHORTNESS OF BREATH *UNUSUAL BRUISING OR BLEEDING *URINARY PROBLEMS (pain or burning when urinating, or frequent urination) *BOWEL PROBLEMS (unusual diarrhea, constipation, pain near the anus) TENDERNESS IN MOUTH AND THROAT WITH OR WITHOUT PRESENCE OF ULCERS (sore throat, sores in mouth, or a toothache) UNUSUAL RASH, SWELLING OR PAIN  UNUSUAL VAGINAL DISCHARGE OR ITCHING   Items with * indicate a potential emergency and should be followed up as soon as possible or go to the Emergency Department if any problems should occur.  Please show the CHEMOTHERAPY ALERT CARD or IMMUNOTHERAPY ALERT CARD at check-in to  the Emergency Department and triage nurse.  Should you have questions after your visit or need to cancel or reschedule your appointment, please contact [facility]  Dept: [phone]  and follow the prompts.  Office hours are 8:00 a.m. to 4:30 p.m. Monday - Friday. Please note that voicemails left after 4:00 p.m. may not be returned until the following business day.  We are closed weekends and major holidays. You have access to a nurse at all times for urgent questions. Please call the main number to the clinic Dept: [phone] and follow the prompts.   For any non-urgent questions, you may also contact your provider using MyChart. We now offer e-Visits for anyone 11 and older to request care online for non-urgent symptoms. For details visit [email or web address].   Also download the MyChart app! Go to the app store, search "MyChart", open the app, select [facility], and log in with your MyChart username and password.  Masks are optional in the cancer centers. If you would like for your care team to wear a mask while they are taking care of you, please let them know. For doctor visits, patients may have with them one support Brandon Rubio who is at least 73 years old. At this time, visitors are not allowed in the infusion area. Durvalumab injection What is this medication? DURVALUMAB (dur VAL ue mab) is a monoclonal antibody. It is used to treat lung cancer. This medicine may be used for other purposes; ask your health care provider or pharmacist if you have questions. COMMON BRAND NAME(S): IMFINZI What should I tell my care  team before I take this medication? They need to know if you have any of these conditions: autoimmune diseases like Crohn's disease, ulcerative colitis, or lupus have had or planning to have an allogeneic stem cell transplant (uses someone else's stem cells) history of organ transplant history of radiation to the chest nervous system problems  like myasthenia gravis or Guillain-Barre syndrome an unusual or allergic reaction to durvalumab, other medicines, foods, dyes, or preservatives pregnant or trying to get pregnant breast-feeding How should I use this medication? This medicine is for infusion into a vein. It is given by a health care professional in a hospital or clinic setting. A special MedGuide will be given to you before each treatment. Be sure to read this information carefully each time. Talk to your pediatrician regarding the use of this medicine in children. Special care may be needed. Overdosage: If you think you have taken too much of this medicine contact a poison control center or emergency room at once. NOTE: This medicine is only for you. Do not share this medicine with others. What if I miss a dose? It is important not to miss your dose. Call your doctor or health care professional if you are unable to keep an appointment. What may interact with this medication? Interactions have not been studied. This list may not describe all possible interactions. Give your health care provider a list of all the medicines, herbs, non-prescription drugs, or dietary supplements you use. Also tell them if you smoke, drink alcohol, or use illegal drugs. Some items may interact with your medicine. What should I watch for while using this medication? This medication may make you feel generally unwell. Continue your course of treatment even though you feel ill unless your care team tells you to stop. You may need blood work done while you are taking this medication. Do not become pregnant while taking this medication or for 3 months after stopping it. Women should inform their care team if they wish to become pregnant or think they might be pregnant. There is a potential for serious side effects to an unborn child. Talk to your care team or pharmacist for more information. Do not breast-feed an infant while taking this medication or for 3  months after stopping it. What side effects may I notice from receiving this medication? Side effects that you should report to your care team as soon as possible: Allergic reactions--skin rash, itching, hives, swelling of the face, lips, tongue, or throat Bloody or watery diarrhea Dizziness, loss of balance or coordination, confusion or trouble speaking Dry cough, shortness of breath or trouble breathing Flushing, mostly over the face, neck, and chest, during injection High blood sugar (hyperglycemia)--increased thirst or amount of urine, unusual weakness or fatigue, blurry vision High thyroid levels (hyperthyroidism)--fast or irregular heartbeat, weight loss, excessive sweating or sensitivity to heat, tremors or shaking, anxiety, nervousness, irregular menstrual cycle or spotting Infection--fever, chills, cough, or sore throat Liver injury--right upper belly pain, loss of appetite, nausea, light-colored stool, dark yellow or brown urine, yellowing skin or eyes, unusual weakness or fatigue Low adrenal gland function--nausea, vomiting, loss of appetite, unusual weakness or fatigue, dizziness, low blood pressure Low thyroid levels (hypothyroidism)--unusual weakness or fatigue, increased sensitivity to cold, constipation, hair loss, dry skin, weight gain, feelings of depression Pancreatitis--severe stomach pain that spreads to your back or gets worse after eating or when touched, fever, nausea, vomiting Rash, fever, and swollen lymph nodes Redness, blistering, peeling or loosening of the skin, including inside the  mouth Wheezing--trouble breathing with loud or whistling sounds Side effects that usually do not require medical attention (report these to your care team if they continue or are bothersome): Fatigue Hair loss This list may not describe all possible side effects. Call your doctor for medical advice about side effects. You may report side effects to FDA at 1-800-FDA-1088. Where should I  keep my medication? This medication is given in a hospital or clinic. It will not be stored at home. NOTE: This sheet is a summary. It may not cover all possible information. If you have questions about this medicine, talk to your doctor, pharmacist, or health care provider.  2023 Elsevier/Gold Standard (2021-01-25 00:00:00)

## 2021-10-29 NOTE — Progress Notes (Signed)
[facility] [phone]   [phone]  OFFICE PROGRESS NOTE  [name], MD [place] [address]  DIAGNOSIS: Stage IIIA (T4, N0, M0) non-small cell lung cancer, favoring adenocarcinoma presented with large right lower lobe lung mass with no mediastinal lymphadenopathy or extrathoracic metastasis diagnosed in October 2022. Had disease progression with enlarging mass and new right hilar and mediastinal lymph nodes in December 2022.      PRIOR THERAPY:  1) Neoadjuvant treatment with carboplatin for AUC of 5, Alimta 500 Mg/M2 and nivolumab 360 Mg IV every 3 weeks.  First dose January 23, 2021.  Status post 3 cycles. 2) Concurrent chemoradiation with carboplatin for an AUC of 2 and paclitaxel 45 mg per metered squared.  First dose expected on 04/01/2021.  Status post 4 cycles   CURRENT THERAPY: Consolidation treatment with immunotherapy with Imfinzi 1500 Mg IV every 4 weeks.  First dose on Jul 08, 2021.  Status post 4 cycles.  INTERVAL HISTORY: Brandon Rubio. 71 y.o. male returns to the clinic today for follow-up visit.  The patient is feeling fine today with no concerning complaints except for the generalized fatigue and lack of stamina.  He denied having any current chest pain, shortness of breath, cough or hemoptysis.  He has no nausea, vomiting, diarrhea or constipation.  He has no recent weight loss or night sweats.  He has no headache or visual changes.  He has been tolerating his treatment with Imfinzi fairly well.  He is followed by endocrinology Dr. Cruzita Lederer for adrenal insufficiency.  He is here today for evaluation before starting cycle #5.   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  MEDICATIONS:  Current Outpatient Medications  Medication Sig Dispense Refill   aspirin EC 81 MG tablet Take 81 mg by mouth in the morning. Swallow whole.     bisacodyl (DULCOLAX) 5 MG EC tablet Take 5 mg by mouth daily as needed for moderate constipation.     HYDROcodone-acetaminophen (NORCO) 5-325 MG tablet Take 1 tablet by mouth every 6 (six) hours as needed for moderate pain. 60 tablet 0   metFORMIN (GLUCOPHAGE) 1000 MG tablet TAKE 1 TABLET(1000 MG) BY MOUTH TWICE DAILY WITH A MEAL 180 tablet 1   midodrine (PROAMATINE) 10 MG tablet Take 1 tablet (10 mg total) by mouth 3 (three) times daily with meals. (Patient not taking: Reported on 08/06/2021) 90 tablet 0   Multiple Vitamins-Minerals (CENTRUM SILVER 50+MEN) TABS Take 1 tablet by mouth daily with breakfast.     omeprazole (PRILOSEC) 20 MG capsule TAKE 1 CAPSULE BY MOUTH TWICE DAILY BEFORE A MEAL 120 capsule 3   predniSONE (DELTASONE) 5 MG tablet Take 1.5 tablets (7.5 mg total) by mouth daily with breakfast. 60 tablet 5   prochlorperazine (COMPAZINE) 10 MG tablet TAKE 1 TABLET(10 MG) BY MOUTH EVERY 6 HOURS AS NEEDED FOR NAUSEA OR VOMITING (Patient not taking: Reported on 08/06/2021) 30  tablet 0   simvastatin (ZOCOR) 40 MG tablet TAKE 1 TABLET(40 MG) BY MOUTH DAILY AT 6 PM 90 tablet 3   No current facility-administered medications for this visit.    SURGICAL HISTORY:  Past Surgical History:  Procedure Laterality Date   BRONCHIAL BIOPSY  12/10/2020   Procedure: BRONCHIAL BIOPSIES;  Surgeon: Collene Gobble, MD;  Location: [facility] ENDOSCOPY;  Service: Pulmonary;;   BRONCHIAL BRUSHINGS  12/10/2020   Procedure: BRONCHIAL BRUSHINGS;  Surgeon: Collene Gobble, MD;  Location[facility]COPY;  Service: Pulmonary;;   BRONCHIAL NEEDLE  ASPIRATION BIOPSY  12/10/2020   Procedure: BRONCHIAL NEEDLE ASPIRATION BIOPSIES;  Surgeon: Collene Gobble, MD;  Location: [facility] ENDOSCOPY;  Service: Pulmonary;;   COLONOSCOPY     COLONOSCOPY W/ POLYPECTOMY  03/10/2009   LARYNGOSCOPY  03/10/1997   VIDEO BRONCHOSCOPY WITH ENDOBRONCHIAL NAVIGATION N/A 12/10/2020   Procedure: ROBOTIC [place];  Surgeon: Collene Gobble, MD;  Location: [facility] ENDOSCOPY;  Service: Pulmonary;  Laterality: N/A;    REVIEW OF SYSTEMS:  A comprehensive review of systems was negative except for: Constitutional: positive for fatigue   PHYSICAL EXAMINATION: General appearance: alert, cooperative, fatigued, and no distress Head: Normocephalic, without obvious abnormality, atraumatic Neck: no adenopathy, no JVD, supple, symmetrical, trachea midline, and thyroid not enlarged, symmetric, no tenderness/mass/nodules Lymph nodes: Cervical, supraclavicular, and axillary nodes normal. Resp: clear to auscultation bilaterally Back: symmetric, no curvature. ROM normal. No CVA tenderness. Cardio: regular rate and rhythm, S1, S2 normal, no murmur, click, rub or gallop GI: soft, non-tender; bowel sounds normal; no masses,  no organomegaly Extremities: extremities normal, atraumatic, no cyanosis or edema  ECOG PERFORMANCE STATUS: 1 - Symptomatic but completely ambulatory  Blood pressure (!) 110/92, pulse 95, temperature 98.3 F (36.8 C), temperature source Oral, resp. rate 18, weight 181 lb 4 oz (82.2 kg), SpO2 96 %.  LABORATORY DATA: Lab Results  Component Value Date   WBC 10.7 (H) 10/29/2021   HGB 11.6 (L) 10/29/2021   HCT 33.6 (L) 10/29/2021   MCV 86.6 10/29/2021   PLT 225 10/29/2021      Chemistry      Component Value Date/Time   NA 138 10/29/2021 1017   K 3.8 10/29/2021 1017   CL 105 10/29/2021 1017   CO2 27 10/29/2021 1017   BUN 13 10/29/2021 1017   CREATININE 0.78 10/29/2021 1017      Component Value Date/Time   CALCIUM 9.2  10/29/2021 1017   ALKPHOS 47 10/29/2021 1017   AST 11 (L) 10/29/2021 1017   ALT 9 10/29/2021 1017   BILITOT 0.3 10/29/2021 1017       RADIOGRAPHIC STUDIES: No results found.  ASSESSMENT AND PLAN: This is a very pleasant 71 years old white male with history of a stage IIIA (T4, N1, M0) non-small cell lung cancer, adenocarcinoma presented with right lower lobe lung mass with a right hilar and no mediastinal lymphadenopathy and no evidence of extrathoracic disease.  The patient was diagnosed in October 2022 status post 3 cycles of neoadjuvant systemic chemotherapy with carboplatin, Alimta and nivolumab but unfortunately has no evidence for significant improvement of his disease and actually has some evidence for progression. The patient underwent a course of concurrent chemoradiation weekly carboplatin for AUC of 2 and paclitaxel 45 Mg/M2.  Status post 4 cycles. The patient had a rough time with the course of concurrent chemoradiation and he was hospitalized several times during this course. Imaging studies after the course of concurrent chemoradiation showed no concerning  findings for disease progression. The patient is currently undergoing treatment with immunotherapy with Imfinzi 1500 Mg IV every 4 weeks status post 4 cycles. The patient has been tolerating this treatment fairly well except for fatigue. I recommended for him to proceed with cycle #5 today as planned. For the fatigue encouraged the patient to start exercising at regular basis and he will try to join one of the local fitness center. I will see him back for follow-up visit in 4 weeks for evaluation before the next cycle of his treatment. He was advised to call immediately if he has any other concerning symptoms in the interval. The patient voices understanding of current disease status and treatment options and is in agreement with the current care plan.  All questions were answered. The patient knows to call the clinic with any  problems, questions or concerns. We can certainly see the patient much sooner if necessary.   Disclaimer: This note was dictated with voice recognition software. Similar sounding words can inadvertently be transcribed and may not be corrected upon review.

## 2021-10-29 NOTE — Progress Notes (Signed)
Nutrition Follow-up:  Patient with small cell lung cancer. He is currently receiving Imfinzi q4w (first 07/08/21).  Patient completed treatment early. He had left infusion area prior to RD arrival. Called pt for nutrition follow-up via telephone. Pt reports tolerating treatment well. He denies nausea, vomiting, diarrhea, constipation. Patient reports appetite is about the same. He is trying to snack more. Patient reports his back "is still giving him a fit."    Medications: reviewed   Labs: glucose 138  Anthropometrics: Weight 181 lb 4 oz today stable   7/25 - 180 lb 8 oz    NUTRITION DIAGNOSIS: Severe malnutrition ongoing, but stable   INTERVENTION:  Continue strategies for increasing calories and protein with small frequent meals/snacks Continue working to increase intake of water    MONITORING, EVALUATION, GOAL: weight trends, intake   NEXT VISIT: Tuesday October 17 during infusion.

## 2021-11-05 ENCOUNTER — Telehealth: Payer: Self-pay

## 2021-11-05 DIAGNOSIS — C3491 Malignant neoplasm of unspecified part of right bronchus or lung: Secondary | ICD-10-CM

## 2021-11-05 MED ORDER — HYDROCODONE-ACETAMINOPHEN 5-325 MG PO TABS: 1.0000 | ORAL_TABLET | Freq: Four times a day (QID) | ORAL | 0 refills | Status: DC | PRN

## 2021-11-05 NOTE — Telephone Encounter (Signed)
Pt is requesting a refill: HYDROcodone-acetaminophen ([place]) 5-325 MG tablet  Pharmacy: [facility] [place], [place] - [address] AT [address] 08/30/21

## 2021-11-05 NOTE — Telephone Encounter (Signed)
Sent in refill

## 2021-11-08 ENCOUNTER — Telehealth: Payer: Medicare Other

## 2021-11-08 ENCOUNTER — Telehealth: Payer: Self-pay | Admitting: Internal Medicine

## 2021-11-08 NOTE — Telephone Encounter (Signed)
Called patient regarding upcoming appointments, patient is notified. 

## 2021-11-22 NOTE — Progress Notes (Unsigned)
[facility] OFFICE PROGRESS NOTE  [name], MD [place] [address]  DIAGNOSIS: Stage IIIA (T4, N0, M0) non-small cell lung cancer, favoring adenocarcinoma presented with large right lower lobe lung mass with no mediastinal lymphadenopathy or extrathoracic metastasis diagnosed in October 2022. Had disease progression with enlarging mass and new right hilar and mediastinal lymph nodes in December 2022.   PRIOR THERAPY: 1) Neoadjuvant treatment with carboplatin for AUC of 5, Alimta 500 Mg/M2 and nivolumab 360 Mg IV every 3 weeks.  First dose January 23, 2021.  Status post 3 cycles. 2) Concurrent chemoradiation with carboplatin for an AUC of 2 and paclitaxel 45 mg per metered squared.  First dose expected on 04/01/2021.  Status post 4 cycles  CURRENT THERAPY: Consolidation treatment with immunotherapy with Imfinzi 1500 Mg IV every 4 weeks.  First dose on Jul 08, 2021.  Status post 5 cycles.   INTERVAL HISTORY: Brandon Rubio. 71 y.o. male returns returns to the clinic today for a follow-up visit.  The patient is feeling fairly well today without any concerning complaints except for ongoing right shoulder pain. Sometimes it hurts to raise his right arm. He is prescribed norco by his PCP. He also uses OTC topical which helps. It does not hurt every day. He reports he was told previously that this is referred pain from his cancer in the right lung. 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 He has not had an x ray of the shoulder. The patient is followed closely by endocrinology for adrenal insufficiency.    The patient is currently undergoing immunotherapy with Imfinzi.  He is status post 5 cycles and has been tolerating it fairly well without any concerning adverse side effects besides fatigue. His PCP recommended PT for his back. The patient is  reluctant to perform any exercises as he is fearful it may exacerbate his back pain.  I previously encouraged him to perform lower extremity pedal exercises that we will not put weight on his back.  I also previously discussed pool exercises with him.  Today, he denies any fever or night sweats. Previously was seen by member the nutritionist team.  He gained 2 pounds since last being seen.  He reports his baseline cough which produces clear sputum in the AM.  The patient is not interested in taking any more medication.  He denies any chest pain or hemoptysis.  He reports stable dyspnea on exertion.  He states overall his breathing is "good".  Denies any nausea, vomiting, diarrhea, or constipation.  Denies any headache or visual changes.  Denies any rashes or skin changes.  The patient is here today for evaluation and repeat blood work before undergoing cycle #6.   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  MEDICATIONS:  Current Outpatient Medications  Medication Sig Dispense Refill  aspirin EC 81 MG tablet Take 81 mg by mouth in the morning. Swallow whole.     bisacodyl (DULCOLAX) 5 MG EC tablet Take 5 mg by mouth daily as needed for moderate constipation.     HYDROcodone-acetaminophen (NORCO) 5-325 MG tablet Take 1 tablet by mouth every 6 (six) hours as needed for moderate pain. 60 tablet 0   metFORMIN (GLUCOPHAGE) 1000 MG tablet TAKE 1 TABLET(1000 MG) BY MOUTH TWICE DAILY WITH A MEAL 180 tablet 1   midodrine (PROAMATINE) 10 MG tablet Take 1 tablet (10 mg  total) by mouth 3 (three) times daily with meals. (Patient not taking: Reported on 08/06/2021) 90 tablet 0   Multiple Vitamins-Minerals (CENTRUM SILVER 50+MEN) TABS Take 1 tablet by mouth daily with breakfast.     omeprazole (PRILOSEC) 20 MG capsule TAKE 1 CAPSULE BY MOUTH TWICE DAILY BEFORE A MEAL 120 capsule 3   predniSONE (DELTASONE) 5 MG tablet Take 1.5 tablets (7.5 mg total) by mouth daily with breakfast. 60 tablet 5   prochlorperazine (COMPAZINE) 10 MG tablet TAKE 1 TABLET(10 MG) BY MOUTH EVERY 6 HOURS AS NEEDED FOR NAUSEA OR VOMITING (Patient not taking: Reported on 08/06/2021) 30 tablet 0   simvastatin (ZOCOR) 40 MG tablet TAKE 1 TABLET(40 MG) BY MOUTH DAILY AT 6 PM 90 tablet 3   No current facility-administered medications for this visit.    SURGICAL HISTORY:  Past Surgical History:  Procedure Laterality Date   BRONCHIAL BIOPSY  12/10/2020   Procedure: BRONCHIAL BIOPSIES;  Surgeon: Collene Gobble, MD;  Location: [facility] ENDOSCOPY;  Service: Pulmonary;;   BRONCHIAL BRUSHINGS  12/10/2020   Procedure: BRONCHIAL BRUSHINGS;  Surgeon: Collene Gobble, MD;  Location: [facility] ENDOSCOPY;  Service: Pulmonary;;   BRONCHIAL NEEDLE ASPIRATION BIOPSY  12/10/2020   Procedure: BRONCHIAL NEEDLE ASPIRATION BIOPSIES;  Surgeon: Collene Gobble, MD;  Location: [facility] ENDOSCOPY;  Service: Pulmonary;;   COLONOSCOPY     COLONOSCOPY W/ POLYPECTOMY  03/10/2009   LARYNGOSCOPY  03/10/1997   VIDEO BRONCHOSCOPY WITH ENDOBRONCHIAL NAVIGATION N/A 12/10/2020   Procedure: ROBOTIC [place];  Surgeon: Collene Gobble, MD;  Location: [facility] ENDOSCOPY;  Service: Pulmonary;  Laterality: N/A;    REVIEW OF SYSTEMS:   Review of Systems  Constitutional: Positive for fatigue.  Negative for appetite change, chills,  fever and unexpected weight change.  HENT: Negative for mouth sores, nosebleeds, sore throat and trouble swallowing.   Eyes: Negative for eye problems and icterus.  Respiratory: Positive  for stable cough and dyspnea exertion.  Negative for hemoptysis and wheezing.   Cardiovascular: Negative for chest pain and leg swelling.  Gastrointestinal: Negative for abdominal pain, constipation, diarrhea, nausea and vomiting.  Genitourinary: Negative for bladder incontinence, difficulty urinating, dysuria, frequency and hematuria.   Musculoskeletal: Positive for occasional back and right shoulder pain.  Negative for gait problem, neck pain and neck stiffness.  Skin: Negative for itching and rash.  Neurological: Negative for dizziness, extremity weakness, gait problem, headaches, light-headedness and seizures.  Hematological: Negative for adenopathy. Does not bruise/bleed easily.  Psychiatric/Behavioral: Negative for confusion, depression and sleep disturbance. The patient is not nervous/anxious.     PHYSICAL EXAMINATION:  Blood pressure 111/63, pulse 90, temperature 98 F (36.7 C), temperature source Oral, resp. rate 14, height 6' (1.829 m), weight 183 lb 9.6 oz (83.3 kg), SpO2 96 %.  ECOG PERFORMANCE STATUS: 1  Physical Exam  Constitutional: Oriented to person, place, and time and well-developed, well-nourished, and in no distress.  HENT:  Head: Normocephalic  and atraumatic.  Mouth/Throat: Oropharynx is clear and moist. No oropharyngeal exudate.  Eyes: Conjunctivae are normal. Right eye exhibits no discharge. Left eye exhibits no discharge. No scleral icterus.  Neck: Normal range of motion. Neck supple.  Cardiovascular: Normal rate, regular rhythm, normal heart sounds and intact distal pulses.   Pulmonary/Chest: Effort normal and breath sounds normal. No respiratory distress. No wheezes. No rales.  Abdominal: Soft. Bowel sounds are normal. Exhibits no distension and no mass. There is no tenderness.  Musculoskeletal: Normal range of motion. Exhibits no edema.  Lymphadenopathy:    No cervical adenopathy.  Neurological: Alert and oriented to person, place, and time. Exhibits normal  muscle tone. Gait normal. Coordination normal.  Skin: Skin is warm and dry. No rash noted. Not diaphoretic. No erythema. No pallor.  Psychiatric: Mood, memory and judgment normal.  Vitals reviewed.  LABORATORY DATA: Lab Results  Component Value Date   WBC 11.0 (H) 11/25/2021   HGB 12.0 (L) 11/25/2021   HCT 35.2 (L) 11/25/2021   MCV 87.3 11/25/2021   PLT 196 11/25/2021      Chemistry      Component Value Date/Time   NA 137 11/25/2021 0929   K 3.7 11/25/2021 0929   CL 103 11/25/2021 0929   CO2 23 11/25/2021 0929   BUN 13 11/25/2021 0929   CREATININE 0.75 11/25/2021 0929      Component Value Date/Time   CALCIUM 9.0 11/25/2021 0929   ALKPHOS 53 11/25/2021 0929   AST 14 (L) 11/25/2021 0929   ALT 13 11/25/2021 0929   BILITOT 0.4 11/25/2021 0929       RADIOGRAPHIC STUDIES:  No results found.   ASSESSMENT/PLAN:  This is a very pleasant 71 year old Caucasian male with a history of stage IIIa (T4, N1, M0) non-small cell lung cancer, adenocarcinoma.  He presented with right upper lobe lung mass with right hilar and no mediastinal lymphadenopathy.  He did not have any evidence of extrathoracic disease.  He was diagnosed October 2022.   He is status post 3 cycles of neoadjuvant systemic chemotherapy with carboplatin, Alimta, nivolumab.  Unfortunately, he had no significant improvement in his disease and actually developed disease progression.  He then underwent a course of concurrent chemoradiation.  He is currently undergoing treatment with consolidation immunotherapy with Imfinzi 1500 mg IV every 4 weeks.  He is status post 5 cycles.   Labs were reviewed.  Recommend that he proceed with cycle #6 today's schedule.  We will see him back for follow-up visit in 4 weeks for evaluation and repeat blood work before starting cycle #7.   I will arrange for restaging CT scan of the chest prior to starting his next cycle of treatment.   He will continue to follow with endocrinology  regarding his adrenal insufficiency.  The patient I had a lengthy discussion about his shoulder pain.  The patient not interested in having any imaging of his shoulder at this time as it is not constant.  If he decides to evaluate this further, I encouraged him to see his PCP for possible x-ray.  This could be referred pain from the lung he does have multilevel degenerative changes in his back and it is not unlikely that he may have arthritis or musculoskeletal as in his right shoulder.  May be worthwhile to have that evaluated as well.  We will assess for disease progression in his lung on upcoming CT scan which sometimes gets the neck/shoulder.  He will continue using his Norco and topical  at this time.  I did ask him to reconsider seeing physical therapy for his back.  I do think exercise would help him.  I encouraged him to use his lower extremity foot pedal   Patient had some billing questions.  He was given the number to the billing department.  The patient was advised to call immediately if he has any concerning symptoms in the interval. The patient voices understanding of current disease status and treatment options and is in agreement with the current care plan. All questions were answered. The patient knows to call the clinic with any problems, questions or concerns. We can certainly see the patient much sooner if necessary         Orders Placed This Encounter  Procedures   CT Chest W Contrast    Standing Status:   Future    Standing Expiration Date:   11/25/2022    Order Specific Question:   If indicated for the ordered procedure, I authorize the administration of contrast media per Radiology protocol    Answer:   Yes    Order Specific Question:   Preferred imaging location?    Answer:   [facility]    The total time spent in the appointment was 20-29 minutes.   Tatsuo Musial L Savas Elvin, PA-C 11/25/21

## 2021-11-25 ENCOUNTER — Inpatient Hospital Stay (HOSPITAL_BASED_OUTPATIENT_CLINIC_OR_DEPARTMENT_OTHER): Payer: Medicare Other | Admitting: Physician Assistant

## 2021-11-25 ENCOUNTER — Other Ambulatory Visit: Payer: Self-pay

## 2021-11-25 ENCOUNTER — Inpatient Hospital Stay: Payer: Medicare Other

## 2021-11-25 ENCOUNTER — Inpatient Hospital Stay: Payer: Medicare Other | Attending: Internal Medicine

## 2021-11-25 VITALS — BP 111/63 | HR 90 | Temp 98.0°F | Resp 14 | Ht 72.0 in | Wt 183.6 lb

## 2021-11-25 DIAGNOSIS — Z79899 Other long term (current) drug therapy: Secondary | ICD-10-CM | POA: Insufficient documentation

## 2021-11-25 DIAGNOSIS — C3491 Malignant neoplasm of unspecified part of right bronchus or lung: Secondary | ICD-10-CM

## 2021-11-25 DIAGNOSIS — Z9221 Personal history of antineoplastic chemotherapy: Secondary | ICD-10-CM | POA: Insufficient documentation

## 2021-11-25 DIAGNOSIS — C3431 Malignant neoplasm of lower lobe, right bronchus or lung: Secondary | ICD-10-CM | POA: Diagnosis not present

## 2021-11-25 DIAGNOSIS — Z5112 Encounter for antineoplastic immunotherapy: Secondary | ICD-10-CM | POA: Diagnosis not present

## 2021-11-25 DIAGNOSIS — E2740 Unspecified adrenocortical insufficiency: Secondary | ICD-10-CM | POA: Insufficient documentation

## 2021-11-25 LAB — CMP (CANCER CENTER ONLY)
ALT: 13 U/L (ref 0–44)
AST: 14 U/L — ABNORMAL LOW (ref 15–41)
Albumin: 4 g/dL (ref 3.5–5.0)
Alkaline Phosphatase: 53 U/L (ref 38–126)
Anion gap: 11 (ref 5–15)
BUN: 13 mg/dL (ref 8–23)
CO2: 23 mmol/L (ref 22–32)
Calcium: 9 mg/dL (ref 8.9–10.3)
Chloride: 103 mmol/L (ref 98–111)
Creatinine: 0.75 mg/dL (ref 0.61–1.24)
GFR, Estimated: >60 mL/min (ref >60)
Glucose, Bld: 145 mg/dL — ABNORMAL HIGH (ref 70–99)
Potassium: 3.7 mmol/L (ref 3.5–5.1)
Sodium: 137 mmol/L (ref 135–145)
Total Bilirubin: 0.4 mg/dL (ref 0.3–1.2)
Total Protein: 6.7 g/dL (ref 6.5–8.1)

## 2021-11-25 LAB — CBC WITH DIFFERENTIAL (CANCER CENTER ONLY)
Abs Immature Granulocytes: 0.06 10*3/uL (ref 0.00–0.07)
Basophils Absolute: 0.1 10*3/uL (ref 0.0–0.1)
Basophils Relative: 1 %
Eosinophils Absolute: 0.2 10*3/uL (ref 0.0–0.5)
Eosinophils Relative: 2 %
HCT: 35.2 % — ABNORMAL LOW (ref 39.0–52.0)
Hemoglobin: 12 g/dL — ABNORMAL LOW (ref 13.0–17.0)
Immature Granulocytes: 1 %
Lymphocytes Relative: 12 %
Lymphs Abs: 1.3 10*3/uL (ref 0.7–4.0)
MCH: 29.8 pg (ref 26.0–34.0)
MCHC: 34.1 g/dL (ref 30.0–36.0)
MCV: 87.3 fL (ref 80.0–100.0)
Monocytes Absolute: 0.7 10*3/uL (ref 0.1–1.0)
Monocytes Relative: 6 %
Neutro Abs: 8.7 10*3/uL — ABNORMAL HIGH (ref 1.7–7.7)
Neutrophils Relative %: 78 %
Platelet Count: 196 10*3/uL (ref 150–400)
RBC: 4.03 MIL/uL — ABNORMAL LOW (ref 4.22–5.81)
RDW: 13.4 % (ref 11.5–15.5)
WBC Count: 11 10*3/uL — ABNORMAL HIGH (ref 4.0–10.5)
nRBC: 0 % (ref 0.0–0.2)

## 2021-11-25 LAB — TSH: TSH: 1.5 u[IU]/mL (ref 0.350–4.500)

## 2021-11-25 MED ORDER — SODIUM CHLORIDE 0.9 % IV SOLN
1500.0000 mg | Freq: Once | INTRAVENOUS | Status: AC
  Administered 2021-11-25: 1500 mg via INTRAVENOUS
  Filled 2021-11-25: qty 30

## 2021-11-25 MED ORDER — SODIUM CHLORIDE 0.9 % IV SOLN: Freq: Once | INTRAVENOUS | Status: AC

## 2021-11-25 NOTE — Patient Instructions (Signed)
[facility]  Discharge Instructions: Thank you for choosing [facility] to provide your oncology and hematology care.   If you have a lab appointment with the [facility], please go directly to the [facility] and check in at the registration area.   Wear comfortable clothing and clothing appropriate for easy access to any Portacath or PICC line.   We strive to give you quality time with your provider. You may need to reschedule your appointment if you arrive late (15 or more minutes).  Arriving late affects you and other patients whose appointments are after yours.  Also, if you miss three or more appointments without notifying the office, you may be dismissed from the clinic at the provider's discretion.      For prescription refill requests, have your pharmacy contact our office and allow 72 hours for refills to be completed.    Today you received the following chemotherapy and/or immunotherapy agents: Durvalumab      To help prevent nausea and vomiting after your treatment, we encourage you to take your nausea medication as directed.  BELOW ARE SYMPTOMS THAT SHOULD BE REPORTED IMMEDIATELY: *FEVER GREATER THAN 100.4 F (38 C) OR HIGHER *CHILLS OR SWEATING *NAUSEA AND VOMITING THAT IS NOT CONTROLLED WITH YOUR NAUSEA MEDICATION *UNUSUAL SHORTNESS OF BREATH *UNUSUAL BRUISING OR BLEEDING *URINARY PROBLEMS (pain or burning when urinating, or frequent urination) *BOWEL PROBLEMS (unusual diarrhea, constipation, pain near the anus) TENDERNESS IN MOUTH AND THROAT WITH OR WITHOUT PRESENCE OF ULCERS (sore throat, sores in mouth, or a toothache) UNUSUAL RASH, SWELLING OR PAIN  UNUSUAL VAGINAL DISCHARGE OR ITCHING   Items with * indicate a potential emergency and should be followed up as soon as possible or go to the Emergency Department if any problems should occur.  Please show the CHEMOTHERAPY ALERT CARD or IMMUNOTHERAPY ALERT CARD at check-in to  the Emergency Department and triage nurse.  Should you have questions after your visit or need to cancel or reschedule your appointment, please contact [facility]  Dept: [phone]  and follow the prompts.  Office hours are 8:00 a.m. to 4:30 p.m. Monday - Friday. Please note that voicemails left after 4:00 p.m. may not be returned until the following business day.  We are closed weekends and major holidays. You have access to a nurse at all times for urgent questions. Please call the main number to the clinic Dept: [phone] and follow the prompts.   For any non-urgent questions, you may also contact your provider using MyChart. We now offer e-Visits for anyone 62 and older to request care online for non-urgent symptoms. For details visit [email or web address].   Also download the MyChart app! Go to the app store, search "MyChart", open the app, select [facility], and log in with your MyChart username and password.  Masks are optional in the cancer centers. If you would like for your care team to wear a mask while they are taking care of you, please let them know. For doctor visits, patients may have with them one support person who is at least 73 years old. At this time, visitors are not allowed in the infusion area. Durvalumab injection What is this medication? DURVALUMAB (dur VAL ue mab) is a monoclonal antibody. It is used to treat lung cancer. This medicine may be used for other purposes; ask your health care provider or pharmacist if you have questions. COMMON BRAND NAME(S): IMFINZI What should I tell my care  team before I take this medication? They need to know if you have any of these conditions: autoimmune diseases like Crohn's disease, ulcerative colitis, or lupus have had or planning to have an allogeneic stem cell transplant (uses someone else's stem cells) history of organ transplant history of radiation to the chest nervous system problems  like myasthenia gravis or Guillain-Barre syndrome an unusual or allergic reaction to durvalumab, other medicines, foods, dyes, or preservatives pregnant or trying to get pregnant breast-feeding How should I use this medication? This medicine is for infusion into a vein. It is given by a health care professional in a hospital or clinic setting. A special MedGuide will be given to you before each treatment. Be sure to read this information carefully each time. Talk to your pediatrician regarding the use of this medicine in children. Special care may be needed. Overdosage: If you think you have taken too much of this medicine contact a poison control center or emergency room at once. NOTE: This medicine is only for you. Do not share this medicine with others. What if I miss a dose? It is important not to miss your dose. Call your doctor or health care professional if you are unable to keep an appointment. What may interact with this medication? Interactions have not been studied. This list may not describe all possible interactions. Give your health care provider a list of all the medicines, herbs, non-prescription drugs, or dietary supplements you use. Also tell them if you smoke, drink alcohol, or use illegal drugs. Some items may interact with your medicine. What should I watch for while using this medication? This medication may make you feel generally unwell. Continue your course of treatment even though you feel ill unless your care team tells you to stop. You may need blood work done while you are taking this medication. Do not become pregnant while taking this medication or for 3 months after stopping it. Women should inform their care team if they wish to become pregnant or think they might be pregnant. There is a potential for serious side effects to an unborn child. Talk to your care team or pharmacist for more information. Do not breast-feed an infant while taking this medication or for 3  months after stopping it. What side effects may I notice from receiving this medication? Side effects that you should report to your care team as soon as possible: Allergic reactions--skin rash, itching, hives, swelling of the face, lips, tongue, or throat Bloody or watery diarrhea Dizziness, loss of balance or coordination, confusion or trouble speaking Dry cough, shortness of breath or trouble breathing Flushing, mostly over the face, neck, and chest, during injection High blood sugar (hyperglycemia)--increased thirst or amount of urine, unusual weakness or fatigue, blurry vision High thyroid levels (hyperthyroidism)--fast or irregular heartbeat, weight loss, excessive sweating or sensitivity to heat, tremors or shaking, anxiety, nervousness, irregular menstrual cycle or spotting Infection--fever, chills, cough, or sore throat Liver injury--right upper belly pain, loss of appetite, nausea, light-colored stool, dark yellow or brown urine, yellowing skin or eyes, unusual weakness or fatigue Low adrenal gland function--nausea, vomiting, loss of appetite, unusual weakness or fatigue, dizziness, low blood pressure Low thyroid levels (hypothyroidism)--unusual weakness or fatigue, increased sensitivity to cold, constipation, hair loss, dry skin, weight gain, feelings of depression Pancreatitis--severe stomach pain that spreads to your back or gets worse after eating or when touched, fever, nausea, vomiting Rash, fever, and swollen lymph nodes Redness, blistering, peeling or loosening of the skin, including inside the  mouth Wheezing--trouble breathing with loud or whistling sounds Side effects that usually do not require medical attention (report these to your care team if they continue or are bothersome): Fatigue Hair loss This list may not describe all possible side effects. Call your doctor for medical advice about side effects. You may report side effects to FDA at 1-800-FDA-1088. Where should I  keep my medication? This medication is given in a hospital or clinic. It will not be stored at home. NOTE: This sheet is a summary. It may not cover all possible information. If you have questions about this medicine, talk to your doctor, pharmacist, or health care provider.  2023 Elsevier/Gold Standard (2021-01-25 00:00:00)

## 2021-11-26 LAB — T4: T4, Total: 8.3 ug/dL (ref 4.5–12.0)

## 2021-12-20 ENCOUNTER — Telehealth: Payer: Self-pay | Admitting: Internal Medicine

## 2021-12-20 ENCOUNTER — Ambulatory Visit (HOSPITAL_COMMUNITY)
Admission: RE | Admit: 2021-12-20 | Discharge: 2021-12-20 | Disposition: A | Discharge status: Home / Self Care | Payer: Medicare Other | Source: Ambulatory Visit | Attending: Physician Assistant | Admitting: Physician Assistant

## 2021-12-20 DIAGNOSIS — J90 Pleural effusion, not elsewhere classified: Secondary | ICD-10-CM | POA: Diagnosis not present

## 2021-12-20 DIAGNOSIS — C3491 Malignant neoplasm of unspecified part of right bronchus or lung: Secondary | ICD-10-CM | POA: Diagnosis not present

## 2021-12-20 DIAGNOSIS — J432 Centrilobular emphysema: Secondary | ICD-10-CM | POA: Diagnosis not present

## 2021-12-20 DIAGNOSIS — C3490 Malignant neoplasm of unspecified part of unspecified bronchus or lung: Secondary | ICD-10-CM | POA: Diagnosis not present

## 2021-12-20 DIAGNOSIS — J181 Lobar pneumonia, unspecified organism: Secondary | ICD-10-CM | POA: Diagnosis not present

## 2021-12-20 MED ORDER — HYDROCODONE-ACETAMINOPHEN 5-325 MG PO TABS: 1.0000 | ORAL_TABLET | Freq: Four times a day (QID) | ORAL | 0 refills | Status: DC | PRN

## 2021-12-20 MED ORDER — IOHEXOL 300 MG/ML  SOLN
75.0000 mL | Freq: Once | INTRAMUSCULAR | Status: AC | PRN
  Administered 2021-12-20: 75 mL via INTRAVENOUS

## 2021-12-20 NOTE — Telephone Encounter (Signed)
Patient needs his hydrocodone refilled - he is going to make an appointment when he has all his cancer appointments set - Please send this to [facility] on [address], [place]

## 2021-12-20 NOTE — Telephone Encounter (Signed)
Sent in

## 2021-12-23 ENCOUNTER — Telehealth: Payer: Self-pay | Admitting: Medical Oncology

## 2021-12-24 ENCOUNTER — Inpatient Hospital Stay: Payer: Medicare Other | Admitting: Internal Medicine

## 2021-12-24 ENCOUNTER — Inpatient Hospital Stay: Payer: Medicare Other

## 2021-12-24 ENCOUNTER — Other Ambulatory Visit: Payer: Self-pay | Admitting: Medical Oncology

## 2021-12-24 ENCOUNTER — Inpatient Hospital Stay: Payer: Medicare Other | Attending: Internal Medicine

## 2021-12-24 ENCOUNTER — Telehealth: Payer: Self-pay | Admitting: Internal Medicine

## 2021-12-24 ENCOUNTER — Other Ambulatory Visit: Payer: Self-pay

## 2021-12-24 ENCOUNTER — Inpatient Hospital Stay: Payer: Medicare Other | Admitting: Dietician

## 2021-12-24 ENCOUNTER — Inpatient Hospital Stay (HOSPITAL_BASED_OUTPATIENT_CLINIC_OR_DEPARTMENT_OTHER): Payer: Medicare Other | Admitting: Internal Medicine

## 2021-12-24 ENCOUNTER — Encounter: Payer: Self-pay | Admitting: Internal Medicine

## 2021-12-24 VITALS — BP 119/76 | HR 91 | Resp 17

## 2021-12-24 VITALS — BP 120/65 | HR 111 | Temp 97.8°F | Resp 17 | Wt 186.7 lb

## 2021-12-24 DIAGNOSIS — Z5112 Encounter for antineoplastic immunotherapy: Secondary | ICD-10-CM | POA: Diagnosis not present

## 2021-12-24 DIAGNOSIS — Z79899 Other long term (current) drug therapy: Secondary | ICD-10-CM | POA: Diagnosis not present

## 2021-12-24 DIAGNOSIS — C3491 Malignant neoplasm of unspecified part of right bronchus or lung: Secondary | ICD-10-CM

## 2021-12-24 DIAGNOSIS — C3431 Malignant neoplasm of lower lobe, right bronchus or lung: Secondary | ICD-10-CM | POA: Insufficient documentation

## 2021-12-24 DIAGNOSIS — R5382 Chronic fatigue, unspecified: Secondary | ICD-10-CM

## 2021-12-24 DIAGNOSIS — R1310 Dysphagia, unspecified: Secondary | ICD-10-CM | POA: Diagnosis not present

## 2021-12-24 DIAGNOSIS — C7802 Secondary malignant neoplasm of left lung: Secondary | ICD-10-CM | POA: Diagnosis not present

## 2021-12-24 DIAGNOSIS — C7801 Secondary malignant neoplasm of right lung: Secondary | ICD-10-CM | POA: Insufficient documentation

## 2021-12-24 LAB — CMP (CANCER CENTER ONLY)
ALT: 12 U/L (ref 0–44)
AST: 14 U/L — ABNORMAL LOW (ref 15–41)
Albumin: 4.1 g/dL (ref 3.5–5.0)
Alkaline Phosphatase: 58 U/L (ref 38–126)
Anion gap: 10 (ref 5–15)
BUN: 13 mg/dL (ref 8–23)
CO2: 24 mmol/L (ref 22–32)
Calcium: 9.3 mg/dL (ref 8.9–10.3)
Chloride: 102 mmol/L (ref 98–111)
Creatinine: 0.89 mg/dL (ref 0.61–1.24)
GFR, Estimated: >60 mL/min (ref >60)
Glucose, Bld: 181 mg/dL — ABNORMAL HIGH (ref 70–99)
Potassium: 4 mmol/L (ref 3.5–5.1)
Sodium: 136 mmol/L (ref 135–145)
Total Bilirubin: 0.4 mg/dL (ref 0.3–1.2)
Total Protein: 7 g/dL (ref 6.5–8.1)

## 2021-12-24 LAB — CBC WITH DIFFERENTIAL (CANCER CENTER ONLY)
Abs Immature Granulocytes: 0.02 10*3/uL (ref 0.00–0.07)
Basophils Absolute: 0.1 10*3/uL (ref 0.0–0.1)
Basophils Relative: 0 %
Eosinophils Absolute: 0.2 10*3/uL (ref 0.0–0.5)
Eosinophils Relative: 1 %
HCT: 37.6 % — ABNORMAL LOW (ref 39.0–52.0)
Hemoglobin: 13 g/dL (ref 13.0–17.0)
Immature Granulocytes: 0 %
Lymphocytes Relative: 9 %
Lymphs Abs: 1.1 10*3/uL (ref 0.7–4.0)
MCH: 29.9 pg (ref 26.0–34.0)
MCHC: 34.6 g/dL (ref 30.0–36.0)
MCV: 86.4 fL (ref 80.0–100.0)
Monocytes Absolute: 0.6 10*3/uL (ref 0.1–1.0)
Monocytes Relative: 5 %
Neutro Abs: 9.5 10*3/uL — ABNORMAL HIGH (ref 1.7–7.7)
Neutrophils Relative %: 85 %
Platelet Count: 202 10*3/uL (ref 150–400)
RBC: 4.35 MIL/uL (ref 4.22–5.81)
RDW: 12.9 % (ref 11.5–15.5)
WBC Count: 11.4 10*3/uL — ABNORMAL HIGH (ref 4.0–10.5)
nRBC: 0 % (ref 0.0–0.2)

## 2021-12-24 LAB — TSH: TSH: 2.09 u[IU]/mL (ref 0.350–4.500)

## 2021-12-24 MED ORDER — SODIUM CHLORIDE 0.9 % IV SOLN: Freq: Once | INTRAVENOUS | Status: AC

## 2021-12-24 MED ORDER — HEPARIN SOD (PORK) LOCK FLUSH 100 UNIT/ML IV SOLN: 500.0000 [IU] | Freq: Once | INTRAVENOUS | Status: DC | PRN

## 2021-12-24 MED ORDER — SODIUM CHLORIDE 0.9% FLUSH
10.0000 mL | INTRAVENOUS | Status: DC | PRN
  Administered 2021-12-24: 10 mL

## 2021-12-24 MED ORDER — SODIUM CHLORIDE 0.9 % IV SOLN
1500.0000 mg | Freq: Once | INTRAVENOUS | Status: AC
  Administered 2021-12-24: 1500 mg via INTRAVENOUS
  Filled 2021-12-24: qty 30

## 2021-12-24 NOTE — Telephone Encounter (Signed)
Scheduled per 10/17 los, patient received updated calender of upcoming appointments.

## 2021-12-24 NOTE — Progress Notes (Signed)
Per Dr. Julien Nordmann , it is ok to treat pt today with Imfinzi and heart rate of 111.

## 2021-12-24 NOTE — Progress Notes (Signed)
[facility] [phone]   [phone]  OFFICE PROGRESS NOTE  [name], MD [place] [address]  DIAGNOSIS: Stage IIIA (T4, N0, M0) non-small cell lung cancer, favoring adenocarcinoma presented with large right lower lobe lung mass with no mediastinal lymphadenopathy or extrathoracic metastasis diagnosed in October 2022. Had disease progression with enlarging mass and new right hilar and mediastinal lymph nodes in December 2022.      PRIOR THERAPY:  1) Neoadjuvant treatment with carboplatin for AUC of 5, Alimta 500 Mg/M2 and nivolumab 360 Mg IV every 3 weeks.  First dose January 23, 2021.  Status post 3 cycles. 2) Concurrent chemoradiation with carboplatin for an AUC of 2 and paclitaxel 45 mg per metered squared.  First dose expected on 04/01/2021.  Status post 4 cycles   CURRENT THERAPY: Consolidation treatment with immunotherapy with Imfinzi 1500 Mg IV every 4 weeks.  First dose on Jul 08, 2021.  Status post 6 cycles.  INTERVAL HISTORY: Brandon Rubio. 71 y.o. male returns to the clinic today for follow-up visit.  The patient is feeling fine today with no concerning complaints except for mild chest congestion.  He denied having any current chest pain but has mild cough with no hemoptysis.  He also has some mild pain in the right shoulder area.  He denied having any nausea, vomiting, diarrhea or constipation but some mild dysphagia.  He denied having any recent weight loss or night sweats.  He has no headache or visual changes.  He has been tolerating his treatment with immunotherapy with Imfinzi fairly well.  He had repeat CT scan of the chest performed recently and he is here for evaluation and discussion of his scan results.  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  MEDICATIONS:  Current Outpatient Medications  Medication Sig Dispense Refill   aspirin EC 81 MG tablet Take 81 mg by mouth in the morning. Swallow whole.     bisacodyl (DULCOLAX) 5 MG EC tablet Take 5 mg by mouth daily as needed for moderate constipation.     HYDROcodone-acetaminophen (NORCO) 5-325 MG tablet Take 1 tablet by mouth every 6 (six) hours as needed for moderate pain. 60 tablet 0   metFORMIN (GLUCOPHAGE) 1000 MG tablet TAKE 1 TABLET(1000 MG) BY MOUTH TWICE DAILY WITH A MEAL 180 tablet 1   Multiple Vitamins-Minerals (CENTRUM SILVER 50+MEN) TABS Take 1 tablet by mouth daily with breakfast.     omeprazole (PRILOSEC) 20 MG capsule TAKE 1 CAPSULE BY MOUTH TWICE DAILY BEFORE A MEAL 120 capsule 3   predniSONE (DELTASONE) 5 MG tablet Take 1.5 tablets (7.5 mg total) by mouth daily with breakfast. 60 tablet 5   simvastatin (ZOCOR) 40 MG tablet TAKE 1 TABLET(40 MG) BY MOUTH DAILY AT 6 PM 90 tablet 3   prochlorperazine (COMPAZINE) 10 MG tablet TAKE 1 TABLET(10 MG) BY MOUTH EVERY 6 HOURS AS NEEDED FOR NAUSEA OR VOMITING (  Patient not taking: Reported on 08/06/2021) 30 tablet 0   No current facility-administered medications for this visit.    SURGICAL HISTORY:  Past Surgical History:  Procedure Laterality Date   BRONCHIAL BIOPSY  12/10/2020   Procedure: BRONCHIAL BIOPSIES;  Surgeon: Collene Gobble, MD;  Location: [facility] ENDOSCOPY;  Service: Pulmonary;;   BRONCHIAL BRUSHINGS  12/10/2020   Procedure: BRONCHIAL BRUSHINGS;  Surgeon: Collene Gobble, MD;  Location: [facility] ENDOSCOPY;  Service: Pulmonary;;   BRONCHIAL NEEDLE ASPIRATION BIOPSY  12/10/2020   Procedure: BRONCHIAL NEEDLE ASPIRATION BIOPSIES;   Surgeon: Collene Gobble, MD;  Location: [facility] ENDOSCOPY;  Service: Pulmonary;;   COLONOSCOPY     COLONOSCOPY W/ POLYPECTOMY  03/10/2009   LARYNGOSCOPY  03/10/1997   VIDEO BRONCHOSCOPY WITH ENDOBRONCHIAL NAVIGATION N/A 12/10/2020   Procedure: ROBOTIC [place];  Surgeon: Collene Gobble, MD;  Location: [facility] ENDOSCOPY;  Service: Pulmonary;  Laterality: N/A;    REVIEW OF SYSTEMS:  Constitutional: positive for fatigue Eyes: negative Ears, nose, mouth, throat, and face: negative Respiratory: positive for cough and dyspnea on exertion Cardiovascular: negative Gastrointestinal: negative Genitourinary:negative Integument/breast: negative Hematologic/lymphatic: negative Musculoskeletal:negative Neurological: negative Behavioral/Psych: negative Endocrine: negative Allergic/Immunologic: negative   PHYSICAL EXAMINATION: General appearance: alert, cooperative, fatigued, and no distress Head: Normocephalic, without obvious abnormality, atraumatic Neck: no adenopathy, no JVD, supple, symmetrical, trachea midline, and thyroid not enlarged, symmetric, no tenderness/mass/nodules Lymph nodes: Cervical, supraclavicular, and axillary nodes normal. Resp: clear to auscultation bilaterally Back: symmetric, no curvature. ROM normal. No CVA tenderness. Cardio: regular rate and rhythm, S1, S2 normal, no murmur, click, rub or gallop GI: soft, non-tender; bowel sounds normal; no masses,  no organomegaly Extremities: extremities normal, atraumatic, no cyanosis or edema Neurologic: Alert and oriented X 3, normal strength and tone. Normal symmetric reflexes. Normal coordination and gait  ECOG PERFORMANCE STATUS: 1 - Symptomatic but completely ambulatory  Blood pressure 120/65, pulse (!) 111, temperature 97.8 F (36.6 C), temperature source Oral, resp. rate 17, weight 186 lb 11.2 oz (84.7 kg), SpO2 94 %.  LABORATORY DATA: Lab Results  Component Value Date   WBC 11.4 (H)  12/24/2021   HGB 13.0 12/24/2021   HCT 37.6 (L) 12/24/2021   MCV 86.4 12/24/2021   PLT 202 12/24/2021      Chemistry      Component Value Date/Time   NA 136 12/24/2021 0955   K 4.0 12/24/2021 0955   CL 102 12/24/2021 0955   CO2 24 12/24/2021 0955   BUN 13 12/24/2021 0955   CREATININE 0.89 12/24/2021 0955      Component Value Date/Time   CALCIUM 9.3 12/24/2021 0955   ALKPHOS 58 12/24/2021 0955   AST 14 (L) 12/24/2021 0955   ALT 12 12/24/2021 0955   BILITOT 0.4 12/24/2021 0955       RADIOGRAPHIC STUDIES: CT Chest W Contrast  Result Date: 12/23/2021 CLINICAL DATA:  Non-small cell lung cancer.  * Tracking Code: BO * EXAM: CT CHEST WITH CONTRAST TECHNIQUE: Multidetector CT imaging of the chest was performed during intravenous contrast administration. RADIATION DOSE REDUCTION: This exam was performed according to the departmental dose-optimization program which includes automated exposure control, adjustment of the mA and/or kV according to patient size and/or use of iterative reconstruction technique. CONTRAST:  72mL OMNIPAQUE IOHEXOL 300 MG/ML  SOLN COMPARISON:  09/27/2021. FINDINGS: Cardiovascular: Atherosclerotic calcification of the aorta and coronary arteries. Heart size normal. No pericardial effusion. Mediastinum/Nodes: Mediastinal and hilar lymph nodes are not enlarged by CT size criteria. No axillary adenopathy.  Esophagus is grossly unremarkable. Lungs/Pleura: Centrilobular and paraseptal emphysema. Similar, partially calcified consolidation and ground-glass in the peripheral right lower lobe, with surrounding septal thickening and architectural distortion. Findings extend minimally into the posterior segment right upper lobe, as before. No measurable associated lesion. Small, partially loculated right pleural effusion, stable. There is a new 5 mm nodule in the right lower lobe (5/66) and new 7 mm nodule in the posteromedial right middle lobe (5/82). 6 mm nodular lesion in the  anterior left upper lobe (5/63) and clustered peribronchovascular nodularity in the posterior left upper lobe and lingula, more prominent than on 09/27/2021. Additional new or progressive peribronchovascular ill-defined nodules in the left lower lobe, measuring up to 7 mm (5/77). No left pleural fluid. Airway is unremarkable. Upper Abdomen: Visualized portions of the liver, gallbladder and adrenal glands are unremarkable. Scarring in the kidneys bilaterally. 3.8 cm low-attenuation lesion in the left kidney, compatible with a cyst. No specific follow-up necessary. Visualized portions of the spleen and pancreas are unremarkable. Stomach is decompressed with a does appear to be wall thickening, similar to 09/27/2021. Upper abdominal lymph nodes are not enlarged by CT size criteria. Musculoskeletal: Degenerative changes in the spine. No worrisome lytic or sclerotic lesions. IMPRESSION: 1. Post treatment consolidation, ground-glass and architectural distortion in the posterior segment right upper lobe and right lower lobe, unchanged from 09/27/2021. No discrete measurable lesion. Associated small loculated right pleural effusion. 2. New/increasing peribronchovascular nodular lesions in the lungs bilaterally may be due to an atypical/fungal etiology. Metastatic disease is also a consideration. 3. Persistent gastric wall thickening.  Please correlate clinically. 4. Aortic atherosclerosis (ICD10-I70.0). Coronary artery calcification. 5.  Emphysema (ICD10-J43.9). Electronically Signed   By: Lorin Picket M.D.   On: 12/23/2021 13:28    ASSESSMENT AND PLAN: This is a very pleasant 71 years old white male with history of a stage IIIA (T4, N1, M0) non-small cell lung cancer, adenocarcinoma presented with right lower lobe lung mass with a right hilar and no mediastinal lymphadenopathy and no evidence of extrathoracic disease.  The patient was diagnosed in October 2022 status post 3 cycles of neoadjuvant systemic chemotherapy  with carboplatin, Alimta and nivolumab but unfortunately has no evidence for significant improvement of his disease and actually has some evidence for progression. The patient underwent a course of concurrent chemoradiation weekly carboplatin for AUC of 2 and paclitaxel 45 Mg/M2.  Status post 4 cycles. The patient had a rough time with the course of concurrent chemoradiation and he was hospitalized several times during this course. Imaging studies after the course of concurrent chemoradiation showed no concerning findings for disease progression. The patient is currently undergoing treatment with immunotherapy with Imfinzi 1500 Mg IV every 4 weeks status post 6 cycles. He has been tolerating this treatment well with no concerning adverse effects. He had repeat CT scan of the chest performed recently.  I personally and independently reviewed the scan images and discussed results with the patient today. His scan showed no concerning findings for disease progression but there was new/increasing peribronchovascular nodular lesions in the lungs bilaterally likely to be secondary to atypical/fungal etiology but metastatic disease could not be completely excluded. I recommended for the patient to continue his current treatment with Imfinzi and he will proceed with cycle #7 today. For the dysphagia, I recommended for the patient to see gastroenterology for consideration of evaluation and esophageal dilatation but he declined. I will see him back for follow-up visit in 4 weeks for evaluation before the next cycle  of his treatment. He was advised to call immediately if he has any other concerning symptoms in the interval. The patient voices understanding of current disease status and treatment options and is in agreement with the current care plan.  All questions were answered. The patient knows to call the clinic with any problems, questions or concerns. We can certainly see the patient much sooner if  necessary.   Disclaimer: This note was dictated with voice recognition software. Similar sounding words can inadvertently be transcribed and may not be corrected upon review.

## 2021-12-25 LAB — T4: T4, Total: 9.2 ug/dL (ref 4.5–12.0)

## 2022-01-21 ENCOUNTER — Ambulatory Visit: Payer: Medicare Other

## 2022-01-21 ENCOUNTER — Inpatient Hospital Stay (HOSPITAL_BASED_OUTPATIENT_CLINIC_OR_DEPARTMENT_OTHER): Payer: Medicare Other | Admitting: Internal Medicine

## 2022-01-21 ENCOUNTER — Inpatient Hospital Stay: Payer: Medicare Other

## 2022-01-21 ENCOUNTER — Other Ambulatory Visit: Payer: Medicare Other

## 2022-01-21 ENCOUNTER — Ambulatory Visit: Payer: Medicare Other | Admitting: Internal Medicine

## 2022-01-21 ENCOUNTER — Other Ambulatory Visit: Payer: Self-pay

## 2022-01-21 ENCOUNTER — Inpatient Hospital Stay: Payer: Medicare Other | Attending: Internal Medicine

## 2022-01-21 VITALS — BP 104/55 | HR 88 | Resp 18

## 2022-01-21 DIAGNOSIS — C3491 Malignant neoplasm of unspecified part of right bronchus or lung: Secondary | ICD-10-CM

## 2022-01-21 DIAGNOSIS — R5382 Chronic fatigue, unspecified: Secondary | ICD-10-CM

## 2022-01-21 DIAGNOSIS — Z9221 Personal history of antineoplastic chemotherapy: Secondary | ICD-10-CM | POA: Insufficient documentation

## 2022-01-21 DIAGNOSIS — C3431 Malignant neoplasm of lower lobe, right bronchus or lung: Secondary | ICD-10-CM | POA: Insufficient documentation

## 2022-01-21 DIAGNOSIS — Z79899 Other long term (current) drug therapy: Secondary | ICD-10-CM | POA: Diagnosis not present

## 2022-01-21 DIAGNOSIS — Z5112 Encounter for antineoplastic immunotherapy: Secondary | ICD-10-CM | POA: Insufficient documentation

## 2022-01-21 LAB — CMP (CANCER CENTER ONLY)
ALT: 14 U/L (ref 0–44)
AST: 16 U/L (ref 15–41)
Albumin: 4.3 g/dL (ref 3.5–5.0)
Alkaline Phosphatase: 56 U/L (ref 38–126)
Anion gap: 10 (ref 5–15)
BUN: 11 mg/dL (ref 8–23)
CO2: 24 mmol/L (ref 22–32)
Calcium: 9.3 mg/dL (ref 8.9–10.3)
Chloride: 103 mmol/L (ref 98–111)
Creatinine: 0.87 mg/dL (ref 0.61–1.24)
GFR, Estimated: >60 mL/min (ref >60)
Glucose, Bld: 163 mg/dL — ABNORMAL HIGH (ref 70–99)
Potassium: 3.6 mmol/L (ref 3.5–5.1)
Sodium: 137 mmol/L (ref 135–145)
Total Bilirubin: 0.4 mg/dL (ref 0.3–1.2)
Total Protein: 7.2 g/dL (ref 6.5–8.1)

## 2022-01-21 LAB — CBC WITH DIFFERENTIAL (CANCER CENTER ONLY)
Abs Immature Granulocytes: 0.02 10*3/uL (ref 0.00–0.07)
Basophils Absolute: 0.1 10*3/uL (ref 0.0–0.1)
Basophils Relative: 1 %
Eosinophils Absolute: 0.2 10*3/uL (ref 0.0–0.5)
Eosinophils Relative: 2 %
HCT: 37.2 % — ABNORMAL LOW (ref 39.0–52.0)
Hemoglobin: 13 g/dL (ref 13.0–17.0)
Immature Granulocytes: 0 %
Lymphocytes Relative: 12 %
Lymphs Abs: 1.2 10*3/uL (ref 0.7–4.0)
MCH: 30.4 pg (ref 26.0–34.0)
MCHC: 34.9 g/dL (ref 30.0–36.0)
MCV: 86.9 fL (ref 80.0–100.0)
Monocytes Absolute: 0.6 10*3/uL (ref 0.1–1.0)
Monocytes Relative: 6 %
Neutro Abs: 8.1 10*3/uL — ABNORMAL HIGH (ref 1.7–7.7)
Neutrophils Relative %: 79 %
Platelet Count: 191 10*3/uL (ref 150–400)
RBC: 4.28 MIL/uL (ref 4.22–5.81)
RDW: 12.8 % (ref 11.5–15.5)
WBC Count: 10.2 10*3/uL (ref 4.0–10.5)
nRBC: 0 % (ref 0.0–0.2)

## 2022-01-21 LAB — TSH: TSH: 1.151 u[IU]/mL (ref 0.350–4.500)

## 2022-01-21 MED ORDER — SODIUM CHLORIDE 0.9 % IV SOLN: Freq: Once | INTRAVENOUS | Status: AC

## 2022-01-21 MED ORDER — SODIUM CHLORIDE 0.9 % IV SOLN
1500.0000 mg | Freq: Once | INTRAVENOUS | Status: AC
  Administered 2022-01-21: 1500 mg via INTRAVENOUS
  Filled 2022-01-21: qty 30

## 2022-01-21 NOTE — Patient Instructions (Signed)
[facility]COL[facility]  Discharge Instructions: Thank you for choosing [facility] to provide your oncology and hematology care.   If you have a lab appointment with the Cancer Center, please go directly to the Cancer Center and check in at the registration area.   Wear comfortable clothing and clothing appropriate for easy access to any Portacath or PICC line.   We strive to give you quality time with your provider. You may need to reschedule your appointment if you arrive late (15 or more minutes).  Arriving late affects you and other patients whose appointments are after yours.  Also, if you miss three or more appointments without notifying the office, you may be dismissed from the clinic at the provider's discretion.      For prescription refill requests, have your pharmacy contact our office and allow 72 hours for refills to be completed.    Today you received the following chemotherapy and/or immunotherapy agents; Imfinzi      To help prevent nausea and vomiting after your treatment, we encourage you to take your nausea medication as directed.  BELOW ARE SYMPTOMS THAT SHOULD BE REPORTED IMMEDIATELY: *FEVER GREATER THAN 100.4 F (38 C) OR HIGHER *CHILLS OR SWEATING *NAUSEA AND VOMITING THAT IS NOT CONTROLLED WITH YOUR NAUSEA MEDICATION *UNUSUAL SHORTNESS OF BREATH *UNUSUAL BRUISING OR BLEEDING *URINARY PROBLEMS (pain or burning when urinating, or frequent urination) *BOWEL PROBLEMS (unusual diarrhea, constipation, pain near the anus) TENDERNESS IN MOUTH AND THROAT WITH OR WITHOUT PRESENCE OF ULCERS (sore throat, sores in mouth, or a toothache) UNUSUAL RASH, SWELLING OR PAIN  UNUSUAL VAGINAL DISCHARGE OR ITCHING   Items with * indicate a potential emergency and should be followed up as soon as possible or go to the Emergency Department if any problems should occur.  Please show the CHEMOTHERAPY ALERT CARD or IMMUNOTHERAPY ALERT CARD at check-in to  the Emergency Department and triage nurse.  Should you have questions after your visit or need to cancel or reschedule your appointment, please contact [facility]COL[facility]  Dept: [phone]  and follow the prompts.  Office hours are 8:00 a.m. to 4:30 p.m. Monday - Friday. Please note that voicemails left after 4:00 p.m. may not be returned until the following business day.  We are closed weekends and major holidays. You have access to a nurse at all times for urgent questions. Please call the main number to the clinic Dept: [phone] and follow the prompts.   For any non-urgent questions, you may also contact your provider using MyChart. We now offer e-Visits for anyone 18 and older to request care online for non-urgent symptoms. For details visit [email or web address].   Also download the MyChart app! Go to the app store, search "MyChart", open the app, select [facility], and log in with your MyChart username and password.  Masks are optional in the cancer centers. If you would like for your care team to wear a mask while they are taking care of you, please let them know. You may have one support person who is at least 73 years old accompany you for your appointments. 

## 2022-01-21 NOTE — Progress Notes (Signed)
[facility] [phone]   Fax:([phone]  OFFICE PROGRESS NOTE  [name], MD [place] [address]  DIAGNOSIS: Stage IIIA (T4, N0, M0) non-small cell lung cancer, favoring adenocarcinoma presented with large right lower lobe lung mass with no mediastinal lymphadenopathy or extrathoracic metastasis diagnosed in October 2022. Had disease progression with enlarging mass and new right hilar and mediastinal lymph nodes in December 2022.      PRIOR THERAPY:  1) Neoadjuvant treatment with carboplatin for AUC of 5, Alimta 500 Mg/M2 and nivolumab 360 Mg IV every 3 weeks.  First dose January 23, 2021.  Status post 3 cycles. 2) Concurrent chemoradiation with carboplatin for an AUC of 2 and paclitaxel 45 mg per metered squared.  First dose expected on 04/01/2021.  Status post 4 cycles   CURRENT THERAPY: Consolidation treatment with immunotherapy with Imfinzi 1500 Mg IV every 4 weeks.  First dose on Jul 08, 2021.  Status post 7 cycles.  INTERVAL HISTORY: Brandon Rubio. 71 y.o. male returns to the clinic today for follow-up visit.  The patient is feeling fine today with no concerning complaints except for right shoulder pain.  He denied having any current chest pain, shortness of breath, cough or hemoptysis.  He has no nausea, vomiting, diarrhea or constipation.  He has no headache or visual changes.  He denied having any recent weight loss or night sweats.  He is here today for evaluation before starting cycle #8 of his treatment.  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  MEDICATIONS:  Current Outpatient Medications  Medication Sig Dispense Refill   aspirin EC 81 MG tablet Take 81 mg by mouth in the morning. Swallow whole.     bisacodyl (DULCOLAX) 5 MG EC tablet Take 5 mg by mouth daily as needed for moderate constipation.     HYDROcodone-acetaminophen (NORCO) 5-325 MG tablet Take 1 tablet by mouth every 6 (six) hours as needed for moderate pain. 60 tablet 0   metFORMIN (GLUCOPHAGE) 1000 MG tablet TAKE 1 TABLET(1000 MG) BY MOUTH TWICE DAILY WITH A MEAL 180 tablet 1   Multiple Vitamins-Minerals (CENTRUM SILVER 50+MEN) TABS Take 1 tablet by mouth daily with breakfast.     omeprazole (PRILOSEC) 20 MG capsule TAKE 1 CAPSULE BY MOUTH TWICE DAILY BEFORE A MEAL 120 capsule 3   predniSONE (DELTASONE) 5 MG tablet Take 1.5 tablets (7.5 mg total) by mouth daily with breakfast. 60 tablet 5   prochlorperazine (COMPAZINE) 10 MG tablet TAKE 1 TABLET(10 MG) BY MOUTH EVERY 6 HOURS AS NEEDED FOR NAUSEA OR VOMITING (Patient not taking: Reported on 08/06/2021) 30 tablet 0   simvastatin (ZOCOR) 40 MG tablet TAKE 1 TABLET(40 MG) BY MOUTH DAILY AT 6 PM 90 tablet 3   No current facility-administered medications for this visit.   Facility-Administered Medications Ordered in Other Visits  Medication Dose Route Frequency Provider Last Rate Last Admin   heparin lock  flush 100 unit/mL  500 Units Intracatheter Once PRN Curt Bears, MD       sodium chloride flush (NS) 0.9 % injection 10 mL  10 mL Intracatheter PRN Curt Bears, MD   10 mL at 12/24/21 1125    SURGICAL HISTORY:  Past Surgical History:  Procedure Laterality Date   BRONCHIAL BIOPSY  12/10/2020   Procedure: BRONCHIAL BIOPSIES;  Surgeon: Collene Gobble, MD;  Location: [facility];  Service: Pulmonary;;   BRONCHIAL BRUSHINGS  12/10/2020   Procedure: BRONCHIAL BRUSHINGS;  Surgeon: Collene Gobble, MD;  Location: [facility] ENDOSCOPY;   Service: Pulmonary;;   BRONCHIAL NEEDLE ASPIRATION BIOPSY  12/10/2020   Procedure: BRONCHIAL NEEDLE ASPIRATION BIOPSIES;  Surgeon: Collene Gobble, MD;  Location: [facility] ENDOSCOPY;  Service: Pulmonary;;   COLONOSCOPY     COLONOSCOPY W/ POLYPECTOMY  03/10/2009   LARYNGOSCOPY  03/10/1997   VIDEO BRONCHOSCOPY WITH ENDOBRONCHIAL NAVIGATION N/A 12/10/2020   Procedure: ROBOTIC [place];  Surgeon: Collene Gobble, MD;  Location: [facility] ENDOSCOPY;  Service: Pulmonary;  Laterality: N/A;    REVIEW OF SYSTEMS:  A comprehensive review of systems was negative except for: Musculoskeletal: positive for arthralgias   PHYSICAL EXAMINATION: General appearance: alert, cooperative, fatigued, and no distress Head: Normocephalic, without obvious abnormality, atraumatic Neck: no adenopathy, no JVD, supple, symmetrical, trachea midline, and thyroid not enlarged, symmetric, no tenderness/mass/nodules Lymph nodes: Cervical, supraclavicular, and axillary nodes normal. Resp: clear to auscultation bilaterally Back: symmetric, no curvature. ROM normal. No CVA tenderness. Cardio: regular rate and rhythm, S1, S2 normal, no murmur, click, rub or gallop GI: soft, non-tender; bowel sounds normal; no masses,  no organomegaly Extremities: extremities normal, atraumatic, no cyanosis or edema  ECOG PERFORMANCE STATUS: 1 - Symptomatic but completely ambulatory  Blood pressure 121/70, pulse (!) 109, temperature 97.8 F (36.6 C), temperature source Oral, resp. rate 17, weight 189 lb 3 oz (85.8 kg), SpO2 95 %.  LABORATORY DATA: Lab Results  Component Value Date   WBC 10.2 01/21/2022   HGB 13.0 01/21/2022   HCT 37.2 (L) 01/21/2022   MCV 86.9 01/21/2022   PLT 191 01/21/2022      Chemistry      Component Value Date/Time   NA 136 12/24/2021 0955   K 4.0 12/24/2021 0955   CL 102 12/24/2021 0955   CO2 24 12/24/2021 0955   BUN 13 12/24/2021 0955   CREATININE 0.89 12/24/2021 0955       Component Value Date/Time   CALCIUM 9.3 12/24/2021 0955   ALKPHOS 58 12/24/2021 0955   AST 14 (L) 12/24/2021 0955   ALT 12 12/24/2021 0955   BILITOT 0.4 12/24/2021 0955       RADIOGRAPHIC STUDIES: No results found.  ASSESSMENT AND PLAN: This is a very pleasant 71 years old white male with history of a stage IIIA (T4, N1, M0) non-small cell lung cancer, adenocarcinoma presented with right lower lobe lung mass with a right hilar and no mediastinal lymphadenopathy and no evidence of extrathoracic disease.  The patient was diagnosed in October 2022 status post 3 cycles of neoadjuvant systemic chemotherapy with carboplatin, Alimta and nivolumab but unfortunately has no evidence for significant improvement of his disease and actually has some evidence for progression. The patient underwent a course of concurrent chemoradiation weekly carboplatin for AUC of 2 and paclitaxel 45 Mg/M2.  Status post 4 cycles. The patient had a rough time with the course of concurrent chemoradiation and he was hospitalized several times during this course. Imaging studies  after the course of concurrent chemoradiation showed no concerning findings for disease progression. The patient is currently undergoing treatment with immunotherapy with Imfinzi 1500 Mg IV every 4 weeks status post 7 cycles. The patient has been tolerating this treatment well with no concerning adverse effects. I recommended for him to proceed with cycle #8 today as planned. For the right shoulder arthralgia, I recommended for him to see his primary care physician.  He may need referral to orthopedic surgery for further evaluation and to rule out right shoulder injury. The patient will come back for follow-up visit in 4 weeks for evaluation before the next cycle of his treatment. He was advised to call immediately if he has any other concerning symptoms in the interval. The patient voices understanding of current disease status and treatment options  and is in agreement with the current care plan.  All questions were answered. The patient knows to call the clinic with any problems, questions or concerns. We can certainly see the patient much sooner if necessary.   Disclaimer: This note was dictated with voice recognition software. Similar sounding words can inadvertently be transcribed and may not be corrected upon review.

## 2022-02-12 ENCOUNTER — Other Ambulatory Visit: Payer: Self-pay

## 2022-02-12 DIAGNOSIS — E2749 Other adrenocortical insufficiency: Secondary | ICD-10-CM

## 2022-02-12 MED ORDER — PREDNISONE 5 MG PO TABS: 7.5000 mg | ORAL_TABLET | Freq: Every day | ORAL | 2 refills | Status: DC

## 2022-02-16 NOTE — Progress Notes (Unsigned)
[facility] OFFICE PROGRESS NOTE  Brandon Koch, MD [place] [address]  DIAGNOSIS: Stage IIIA (T4, N0, M0) non-small cell lung cancer, favoring adenocarcinoma presented with large right lower lobe lung mass with no mediastinal lymphadenopathy or extrathoracic metastasis diagnosed in October 2022. Had disease progression with enlarging mass and new right hilar and mediastinal lymph nodes in December 2022.    PRIOR THERAPY:  1) Neoadjuvant treatment with carboplatin for AUC of 5, Alimta 500 Mg/M2 and nivolumab 360 Mg IV every 3 weeks.  First dose January 23, 2021.  Status post 3 cycles. 2) Concurrent chemoradiation with carboplatin for an AUC of 2 and paclitaxel 45 mg per metered squared.  First dose expected on 04/01/2021.  Status post 4 cycles  CURRENT THERAPY: Consolidation treatment with immunotherapy with Imfinzi 1500 Mg IV every 4 weeks.  First dose on Jul 08, 2021.  Status post 8 cycles.    INTERVAL HISTORY: Brandon Rubio. 71 y.o. male returns to the clinic today for a follow up visit. The patient is feeling fairly well today without any concerning complaints except for ongoing right shoulder pain. Sometimes it hurts to raise his right arm. He is prescribed norco by his PCP. He also uses OTC topical which helps.  This pain has been ongoing for ***   The patient is followed closely by endocrinology for adrenal insufficiency.   The patient is currently undergoing immunotherapy with Imfinzi.  He is status post 8 cycles and has been tolerating it fairly well without any concerning adverse side effects besides fatigue. His PCP previously recommended PT for his back***. The patient is reluctant to perform any exercises as he is fearful it may exacerbate his back pain. Today, he denies any fever or night sweats. Previously was seen by member the nutritionist team.  He gained *** pounds since last being seen.  He reports his baseline cough which  produces clear sputum in the AM.  The patient is not interested in taking any more medication.  He denies any chest pain or hemoptysis.  He reports stable dyspnea on exertion.  He states overall his breathing is "***".  Denies any nausea, vomiting, diarrhea, or constipation.  Denies any headache or visual changes.  Denies any rashes or skin changes.  The patient is here today for evaluation and repeat blood work before undergoing cycle #9.   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  MEDICATIONS:  Current Outpatient Medications  Medication Sig Dispense Refill   aspirin EC 81 MG tablet Take 81 mg by mouth in the morning. Swallow whole.     bisacodyl (DULCOLAX) 5 MG EC tablet Take 5 mg by mouth daily as needed for moderate constipation.     HYDROcodone-acetaminophen (NORCO) 5-325 MG tablet Take 1 tablet by mouth every 6 (six) hours as needed for moderate pain. 60 tablet 0   metFORMIN (GLUCOPHAGE) 1000 MG tablet TAKE 1 TABLET(1000 MG) BY MOUTH TWICE DAILY WITH  A MEAL 180 tablet 1   Multiple Vitamins-Minerals (CENTRUM SILVER 50+MEN) TABS Take 1 tablet by mouth daily with breakfast.     omeprazole (PRILOSEC) 20 MG capsule TAKE 1 CAPSULE BY MOUTH TWICE DAILY BEFORE A MEAL 120 capsule 3   predniSONE (DELTASONE) 5 MG tablet Take 1.5 tablets (7.5 mg total) by mouth daily with breakfast. 135 tablet 2   prochlorperazine (COMPAZINE) 10 MG tablet TAKE 1 TABLET(10 MG) BY MOUTH EVERY 6 HOURS AS NEEDED FOR NAUSEA OR VOMITING (Patient not taking: Reported on 08/06/2021) 30  tablet 0   simvastatin (ZOCOR) 40 MG tablet TAKE 1 TABLET(40 MG) BY MOUTH DAILY AT 6 PM 90 tablet 3   No current facility-administered medications for this visit.   Facility-Administered Medications Ordered in Other Visits  Medication Dose Route Frequency Provider Last Rate Last Admin   heparin lock flush 100 unit/mL  500 Units Intracatheter Once PRN Curt Bears, MD       sodium chloride flush (NS) 0.9 % injection 10 mL  10 mL Intracatheter PRN Curt Bears, MD   10 mL at 12/24/21 1125    SURGICAL HISTORY:  Past Surgical History:  Procedure Laterality Date   BRONCHIAL BIOPSY  12/10/2020   Procedure: BRONCHIAL BIOPSIES;  Surgeon: Collene Gobble, MD;  Location: [facility];  Service: Pulmonary;;   BRONCHIAL BRUSHINGS  12/10/2020   Procedure: BRONCHIAL BRUSHINGS;  Surgeon: Collene Gobble, MD;  Location: [facility];  Service: Pulmonary;;   BRONCHIAL NEEDLE ASPIRATION BIOPSY  12/10/2020   Procedure: BRONCHIAL NEEDLE ASPIRATION BIOPSIES;  Surgeon: Collene Gobble, MD;  Location[facility] ENDOSCOPY;  Service: Pulmonary;;   COLONOSCOPY     COLONOSCOPY W/ POLYPECTOMY  03/10/2009   LARYNGOSCOPY  03/10/1997   VIDEO BRONCHOSCOPY WITH ENDOBRONCHIAL NAVIGATION N/A 12/10/2020   Procedure: ROBOTIC [place];  Surgeon: Collene Gobble, MD;  Location: [facility] ENDOSCOPY;  Service: Pulmonary;  Laterality: N/A;    REVIEW OF SYSTEMS:   Review of Systems  Constitutional: Negative for appetite change, chills, fatigue, fever and unexpected weight change.  HENT:   Negative for mouth sores, nosebleeds, sore throat and trouble swallowing.   Eyes: Negative for eye problems and icterus.  Respiratory: Negative for cough, hemoptysis, shortness of breath and wheezing.   Cardiovascular: Negative for chest pain and leg swelling.  Gastrointestinal: Negative for abdominal pain, constipation, diarrhea, nausea and vomiting.  Genitourinary: Negative for bladder incontinence,  difficulty urinating, dysuria, frequency and hematuria.   Musculoskeletal: Negative for back pain, gait problem, neck pain and neck stiffness.  Skin: Negative for itching and rash.  Neurological: Negative for dizziness, extremity weakness, gait problem, headaches, light-headedness and seizures.  Hematological: Negative for adenopathy. Does not bruise/bleed easily.  Psychiatric/Behavioral: Negative for confusion, depression and sleep disturbance. The patient is not nervous/anxious.     PHYSICAL EXAMINATION:  There were no vitals taken for this visit.  ECOG PERFORMANCE STATUS: {CHL ONC ECOG [id number]  Physical Exam  Constitutional: Oriented to person, place, and time and well-developed, well-nourished, and in no distress. No distress.  HENT:  Head: Normocephalic and atraumatic.  Mouth/Throat: Oropharynx is clear and moist. No oropharyngeal exudate.  Eyes: Conjunctivae are normal. Right eye exhibits no discharge. Left eye exhibits no discharge. No scleral icterus.  Neck: Normal range of motion. Neck supple.  Cardiovascular: Normal rate, regular rhythm, normal heart sounds and intact distal pulses.   Pulmonary/Chest: Effort normal and breath sounds normal. No respiratory distress. No wheezes. No rales.  Abdominal: Soft. Bowel sounds are normal.  Exhibits no distension and no mass. There is no tenderness.  Musculoskeletal: Normal range of motion. Exhibits no edema.  Lymphadenopathy:    No cervical adenopathy.  Neurological: Alert and oriented to person, place, and time. Exhibits normal muscle tone. Gait normal. Coordination normal.  Skin: Skin is warm and dry. No rash noted. Not diaphoretic. No erythema. No pallor.  Psychiatric: Mood, memory and judgment normal.  Vitals reviewed.  LABORATORY DATA: Lab Results  Component Value Date   WBC 10.2 01/21/2022   HGB 13.0 01/21/2022   HCT 37.2 (L) 01/21/2022   MCV 86.9 01/21/2022   PLT 191 01/21/2022      Chemistry      Component  Value Date/Time   NA 137 01/21/2022 1255   K 3.6 01/21/2022 1255   CL 103 01/21/2022 1255   CO2 24 01/21/2022 1255   BUN 11 01/21/2022 1255   CREATININE 0.87 01/21/2022 1255      Component Value Date/Time   CALCIUM 9.3 01/21/2022 1255   ALKPHOS 56 01/21/2022 1255   AST 16 01/21/2022 1255   ALT 14 01/21/2022 1255   BILITOT 0.4 01/21/2022 1255       RADIOGRAPHIC STUDIES:  No results found.   ASSESSMENT/PLAN:  This is a very pleasant 71 year old Caucasian male with a history of stage IIIa (T4, N1, M0) non-small cell lung cancer, adenocarcinoma.  He presented with right upper lobe lung mass with right hilar and no mediastinal lymphadenopathy.  He did not have any evidence of extrathoracic disease.  He was diagnosed October 2022.   He is status post 3 cycles of neoadjuvant systemic chemotherapy with carboplatin, Alimta, nivolumab.  Unfortunately, he had no significant improvement in his disease and actually developed disease progression.  He then underwent a course of concurrent chemoradiation.  He is currently undergoing treatment with consolidation immunotherapy with Imfinzi 1500 mg IV every 4 weeks.  He is status post 6 cycles.    Labs were reviewed.  Recommend that he proceed with cycle #9 today's schedule.  I will arrange for a restaging CT scan of the chest prior to his next  cycle of treatment.   We will see him back for follow-up visit in 4 weeks for evaluation and repeat blood work before starting cycle #10   He will continue to follow with endocrinology regarding his adrenal insufficiency.   The patient was advised to call immediately if she has any concerning symptoms in the interval. The patient voices understanding of current disease status and treatment options and is in agreement with the current care plan. All questions were answered. The patient knows to call the clinic with any problems, questions or concerns. We can certainly see the patient much sooner if  necessary     No orders of the defined types were placed in this encounter.    I spent {CHL ONC TIME VISIT - RDEYC:[id number]} counseling the patient face to face. The total time spent in the appointment was {CHL ONC TIME VISIT - SHFWY:[id number]}.  Abhiram Criado L Julius Boniface, PA-C 02/16/22

## 2022-02-17 ENCOUNTER — Ambulatory Visit (INDEPENDENT_AMBULATORY_CARE_PROVIDER_SITE_OTHER): Payer: Medicare Other | Admitting: Internal Medicine

## 2022-02-17 ENCOUNTER — Ambulatory Visit (INDEPENDENT_AMBULATORY_CARE_PROVIDER_SITE_OTHER): Payer: Medicare Other

## 2022-02-17 ENCOUNTER — Encounter: Payer: Self-pay | Admitting: Internal Medicine

## 2022-02-17 VITALS — BP 140/60 | HR 95 | Temp 98.3°F | Ht 72.0 in | Wt 190.0 lb

## 2022-02-17 DIAGNOSIS — C3491 Malignant neoplasm of unspecified part of right bronchus or lung: Secondary | ICD-10-CM

## 2022-02-17 DIAGNOSIS — M25511 Pain in right shoulder: Secondary | ICD-10-CM

## 2022-02-17 DIAGNOSIS — I2510 Atherosclerotic heart disease of native coronary artery without angina pectoris: Secondary | ICD-10-CM | POA: Diagnosis not present

## 2022-02-17 DIAGNOSIS — G8929 Other chronic pain: Secondary | ICD-10-CM

## 2022-02-17 DIAGNOSIS — Z23 Encounter for immunization: Secondary | ICD-10-CM

## 2022-02-17 MED ORDER — HYDROCODONE-ACETAMINOPHEN 5-325 MG PO TABS: 1.0000 | ORAL_TABLET | Freq: Four times a day (QID) | ORAL | 0 refills | Status: DC | PRN

## 2022-02-17 NOTE — Assessment & Plan Note (Signed)
Checking x-ray right shoulder to rule out metastatic disease. Suspect arthritis or referred pain from cancer treatment to right lung. Using hydrocodone/apap 5/325 bid prn for pain which is renewed. If x-ray consistent with arthritis will refer to sports medicine for injection to see if this helps. This does limit ROM and mobility.

## 2022-02-17 NOTE — Assessment & Plan Note (Signed)
With ongoing pain in the right shoulder  (may be referred from right lung) refill hydrocodone 5/325 #60 per month. Advised he can call for refill when needed. He is currently undergoing chemotherapy with oncology.

## 2022-02-17 NOTE — Progress Notes (Signed)
   Subjective:   Patient ID: Brandon Killian., male    DOB: Mar 12, 1950, 71 y.o.   MRN: [id number]  HPI The patient is a 71 YO man coming in for worsening right shoulder pain. Right lung radiation and still undergoing chemotherapy treatment. Most recent CT scan Oct 2023 reviewed.   Review of Systems  Constitutional:  Positive for activity change. Negative for appetite change, fatigue, fever and unexpected weight change.  Respiratory: Negative.    Cardiovascular: Negative.   Musculoskeletal:  Positive for arthralgias, back pain and myalgias.  Skin: Negative.   Neurological:  Negative for syncope, weakness and numbness.    Objective:  Physical Exam Constitutional:      Appearance: He is well-developed.  HENT:     Head: Normocephalic and atraumatic.  Cardiovascular:     Rate and Rhythm: Normal rate and regular rhythm.  Pulmonary:     Effort: Pulmonary effort is normal. No respiratory distress.     Breath sounds: Normal breath sounds. No wheezing or rales.  Abdominal:     General: Bowel sounds are normal. There is no distension.     Palpations: Abdomen is soft.     Tenderness: There is no abdominal tenderness. There is no rebound.  Musculoskeletal:        General: Tenderness present.     Cervical back: Normal range of motion.     Comments: Pain right AC joint and pain with ROM which is limited  Skin:    General: Skin is warm and dry.  Neurological:     Mental Status: He is alert and oriented to person, place, and time.     Coordination: Coordination normal.     Vitals:   02/17/22 0817  BP: (!) 140/60  Pulse: 95  Temp: 98.3 F (36.8 C)  TempSrc: Oral  SpO2: 91%  Weight: 190 lb (86.2 kg)  Height: 6' (1.829 m)   Visit time 20 minutes in face to face communication with patient and coordination of care, additional 10 minutes spent in record review, coordination or care, ordering tests, communicating/referring to other healthcare professionals, documenting in medical  records all on the same day of the visit for total time 30 minutes spent on the visit.   Assessment & Plan:  Flu shot given at visit

## 2022-02-17 NOTE — Patient Instructions (Signed)
We have refilled the hydrocodone and will get the x-ray of the shoulder.

## 2022-02-18 ENCOUNTER — Other Ambulatory Visit: Payer: Self-pay

## 2022-02-18 ENCOUNTER — Inpatient Hospital Stay: Payer: Medicare Other

## 2022-02-18 ENCOUNTER — Inpatient Hospital Stay (HOSPITAL_BASED_OUTPATIENT_CLINIC_OR_DEPARTMENT_OTHER): Payer: Medicare Other | Admitting: Physician Assistant

## 2022-02-18 ENCOUNTER — Inpatient Hospital Stay: Payer: Medicare Other | Attending: Internal Medicine

## 2022-02-18 VITALS — BP 110/59 | HR 105 | Temp 98.3°F | Resp 18 | Ht 72.0 in | Wt 190.7 lb

## 2022-02-18 VITALS — BP 154/77 | HR 99

## 2022-02-18 DIAGNOSIS — Z923 Personal history of irradiation: Secondary | ICD-10-CM | POA: Diagnosis not present

## 2022-02-18 DIAGNOSIS — Z5112 Encounter for antineoplastic immunotherapy: Secondary | ICD-10-CM | POA: Insufficient documentation

## 2022-02-18 DIAGNOSIS — C3431 Malignant neoplasm of lower lobe, right bronchus or lung: Secondary | ICD-10-CM | POA: Insufficient documentation

## 2022-02-18 DIAGNOSIS — C3491 Malignant neoplasm of unspecified part of right bronchus or lung: Secondary | ICD-10-CM | POA: Diagnosis not present

## 2022-02-18 DIAGNOSIS — Z79899 Other long term (current) drug therapy: Secondary | ICD-10-CM | POA: Diagnosis not present

## 2022-02-18 DIAGNOSIS — Z9221 Personal history of antineoplastic chemotherapy: Secondary | ICD-10-CM | POA: Insufficient documentation

## 2022-02-18 LAB — CBC WITH DIFFERENTIAL (CANCER CENTER ONLY)
Abs Immature Granulocytes: 0.02 10*3/uL (ref 0.00–0.07)
Basophils Absolute: 0.1 10*3/uL (ref 0.0–0.1)
Basophils Relative: 1 %
Eosinophils Absolute: 0.2 10*3/uL (ref 0.0–0.5)
Eosinophils Relative: 2 %
HCT: 37.9 % — ABNORMAL LOW (ref 39.0–52.0)
Hemoglobin: 12.9 g/dL — ABNORMAL LOW (ref 13.0–17.0)
Immature Granulocytes: 0 %
Lymphocytes Relative: 15 %
Lymphs Abs: 1.4 10*3/uL (ref 0.7–4.0)
MCH: 29.9 pg (ref 26.0–34.0)
MCHC: 34 g/dL (ref 30.0–36.0)
MCV: 87.7 fL (ref 80.0–100.0)
Monocytes Absolute: 0.8 10*3/uL (ref 0.1–1.0)
Monocytes Relative: 8 %
Neutro Abs: 7.2 10*3/uL (ref 1.7–7.7)
Neutrophils Relative %: 74 %
Platelet Count: 210 10*3/uL (ref 150–400)
RBC: 4.32 MIL/uL (ref 4.22–5.81)
RDW: 12.8 % (ref 11.5–15.5)
WBC Count: 9.6 10*3/uL (ref 4.0–10.5)
nRBC: 0 % (ref 0.0–0.2)

## 2022-02-18 LAB — CMP (CANCER CENTER ONLY)
ALT: 12 U/L (ref 0–44)
AST: 14 U/L — ABNORMAL LOW (ref 15–41)
Albumin: 4.1 g/dL (ref 3.5–5.0)
Alkaline Phosphatase: 52 U/L (ref 38–126)
Anion gap: 10 (ref 5–15)
BUN: 13 mg/dL (ref 8–23)
CO2: 26 mmol/L (ref 22–32)
Calcium: 9.4 mg/dL (ref 8.9–10.3)
Chloride: 104 mmol/L (ref 98–111)
Creatinine: 0.82 mg/dL (ref 0.61–1.24)
GFR, Estimated: >60 mL/min (ref >60)
Glucose, Bld: 142 mg/dL — ABNORMAL HIGH (ref 70–99)
Potassium: 3.5 mmol/L (ref 3.5–5.1)
Sodium: 140 mmol/L (ref 135–145)
Total Bilirubin: 0.5 mg/dL (ref 0.3–1.2)
Total Protein: 6.6 g/dL (ref 6.5–8.1)

## 2022-02-18 LAB — TSH: TSH: 1.309 u[IU]/mL (ref 0.350–4.500)

## 2022-02-18 MED ORDER — SODIUM CHLORIDE 0.9 % IV SOLN: Freq: Once | INTRAVENOUS | Status: AC

## 2022-02-18 MED ORDER — SODIUM CHLORIDE 0.9 % IV SOLN
1500.0000 mg | Freq: Once | INTRAVENOUS | Status: AC
  Administered 2022-02-18: 1500 mg via INTRAVENOUS
  Filled 2022-02-18: qty 30

## 2022-02-18 NOTE — Patient Instructions (Signed)
[facility]  Discharge Instructions: Thank you for choosing [facility] to provide your oncology and hematology care.   If you have a lab appointment with the Cancer Center, please go directly to the Cancer Center and check in at the registration area.   Wear comfortable clothing and clothing appropriate for easy access to any Portacath or PICC line.   We strive to give you quality time with your provider. You may need to reschedule your appointment if you arrive late (15 or more minutes).  Arriving late affects you and other patients whose appointments are after yours.  Also, if you miss three or more appointments without notifying the office, you may be dismissed from the clinic at the provider's discretion.      For prescription refill requests, have your pharmacy contact our office and allow 72 hours for refills to be completed.    Today you received the following chemotherapy and/or immunotherapy agents; Imfinzi      To help prevent nausea and vomiting after your treatment, we encourage you to take your nausea medication as directed.  BELOW ARE SYMPTOMS THAT SHOULD BE REPORTED IMMEDIATELY: *FEVER GREATER THAN 100.4 F (38 C) OR HIGHER *CHILLS OR SWEATING *NAUSEA AND VOMITING THAT IS NOT CONTROLLED WITH YOUR NAUSEA MEDICATION *UNUSUAL SHORTNESS OF BREATH *UNUSUAL BRUISING OR BLEEDING *URINARY PROBLEMS (pain or burning when urinating, or frequent urination) *BOWEL PROBLEMS (unusual diarrhea, constipation, pain near the anus) TENDERNESS IN MOUTH AND THROAT WITH OR WITHOUT PRESENCE OF ULCERS (sore throat, sores in mouth, or a toothache) UNUSUAL RASH, SWELLING OR PAIN  UNUSUAL VAGINAL DISCHARGE OR ITCHING   Items with * indicate a potential emergency and should be followed up as soon as possible or go to the Emergency Department if any problems should occur.  Please show the CHEMOTHERAPY ALERT CARD or IMMUNOTHERAPY ALERT CARD at check-in to  the Emergency Department and triage nurse.  Should you have questions after your visit or need to cancel or reschedule your appointment, please contact [facility]  Dept: [phone]  and follow the prompts.  Office hours are 8:00 a.m. to 4:30 p.m. Monday - Friday. Please note that voicemails left after 4:00 p.m. may not be returned until the following business day.  We are closed weekends and major holidays. You have access to a nurse at all times for urgent questions. Please call the main number to the clinic Dept: [phone] and follow the prompts.   For any non-urgent questions, you may also contact your provider using MyChart. We now offer e-Visits for anyone 18 and older to request care online for non-urgent symptoms. For details visit [email or web address].   Also download the MyChart app! Go to the app store, search "MyChart", open the app, select [facility], and log in with your MyChart username and password.  Masks are optional in the cancer centers. If you would like for your care team to wear a mask while they are taking care of you, please let them know. You may have one support person who is at least 73 years old accompany you for your appointments. 

## 2022-02-20 ENCOUNTER — Other Ambulatory Visit: Payer: Self-pay

## 2022-02-20 LAB — T4: T4, Total: 9.5 ug/dL (ref 4.5–12.0)

## 2022-02-21 ENCOUNTER — Other Ambulatory Visit: Payer: Self-pay

## 2022-02-23 ENCOUNTER — Other Ambulatory Visit: Payer: Self-pay

## 2022-02-23 ENCOUNTER — Other Ambulatory Visit: Payer: Self-pay | Admitting: Internal Medicine

## 2022-03-07 ENCOUNTER — Ambulatory Visit (HOSPITAL_COMMUNITY)
Admission: RE | Admit: 2022-03-07 | Discharge: 2022-03-07 | Disposition: A | Discharge status: Home / Self Care | Payer: Medicare Other | Source: Ambulatory Visit | Attending: Physician Assistant | Admitting: Physician Assistant

## 2022-03-07 DIAGNOSIS — C3491 Malignant neoplasm of unspecified part of right bronchus or lung: Secondary | ICD-10-CM | POA: Diagnosis not present

## 2022-03-07 DIAGNOSIS — J90 Pleural effusion, not elsewhere classified: Secondary | ICD-10-CM | POA: Diagnosis not present

## 2022-03-07 DIAGNOSIS — R918 Other nonspecific abnormal finding of lung field: Secondary | ICD-10-CM | POA: Diagnosis not present

## 2022-03-07 DIAGNOSIS — J439 Emphysema, unspecified: Secondary | ICD-10-CM | POA: Diagnosis not present

## 2022-03-07 MED ORDER — SODIUM CHLORIDE (PF) 0.9 % IJ SOLN
INTRAMUSCULAR | Status: AC
  Filled 2022-03-07: qty 50

## 2022-03-07 MED ORDER — IOHEXOL 300 MG/ML  SOLN
75.0000 mL | Freq: Once | INTRAMUSCULAR | Status: AC | PRN
  Administered 2022-03-07: 75 mL via INTRAVENOUS

## 2022-03-12 ENCOUNTER — Telehealth: Payer: Self-pay | Admitting: Internal Medicine

## 2022-03-12 NOTE — Telephone Encounter (Signed)
Called patient regarding upcoming January and February appointments, patient is notified.

## 2022-03-18 ENCOUNTER — Inpatient Hospital Stay: Payer: Medicare Other | Attending: Internal Medicine | Admitting: Internal Medicine

## 2022-03-18 ENCOUNTER — Inpatient Hospital Stay: Payer: Medicare Other

## 2022-03-18 ENCOUNTER — Encounter: Payer: Self-pay | Admitting: Internal Medicine

## 2022-03-18 ENCOUNTER — Other Ambulatory Visit: Payer: Self-pay

## 2022-03-18 VITALS — BP 127/77 | HR 100 | Temp 98.3°F | Resp 17 | Wt 193.2 lb

## 2022-03-18 DIAGNOSIS — Z923 Personal history of irradiation: Secondary | ICD-10-CM | POA: Insufficient documentation

## 2022-03-18 DIAGNOSIS — C3431 Malignant neoplasm of lower lobe, right bronchus or lung: Secondary | ICD-10-CM | POA: Insufficient documentation

## 2022-03-18 DIAGNOSIS — C3491 Malignant neoplasm of unspecified part of right bronchus or lung: Secondary | ICD-10-CM | POA: Diagnosis not present

## 2022-03-18 DIAGNOSIS — Z9221 Personal history of antineoplastic chemotherapy: Secondary | ICD-10-CM | POA: Insufficient documentation

## 2022-03-18 DIAGNOSIS — Z79899 Other long term (current) drug therapy: Secondary | ICD-10-CM | POA: Insufficient documentation

## 2022-03-18 DIAGNOSIS — R5382 Chronic fatigue, unspecified: Secondary | ICD-10-CM

## 2022-03-18 DIAGNOSIS — C3490 Malignant neoplasm of unspecified part of unspecified bronchus or lung: Secondary | ICD-10-CM | POA: Diagnosis not present

## 2022-03-18 LAB — CBC WITH DIFFERENTIAL (CANCER CENTER ONLY)
Abs Immature Granulocytes: 0.04 10*3/uL (ref 0.00–0.07)
Basophils Absolute: 0.1 10*3/uL (ref 0.0–0.1)
Basophils Relative: 1 %
Eosinophils Absolute: 0.2 10*3/uL (ref 0.0–0.5)
Eosinophils Relative: 2 %
HCT: 36.9 % — ABNORMAL LOW (ref 39.0–52.0)
Hemoglobin: 12.9 g/dL — ABNORMAL LOW (ref 13.0–17.0)
Immature Granulocytes: 0 %
Lymphocytes Relative: 13 %
Lymphs Abs: 1.5 10*3/uL (ref 0.7–4.0)
MCH: 30.2 pg (ref 26.0–34.0)
MCHC: 35 g/dL (ref 30.0–36.0)
MCV: 86.4 fL (ref 80.0–100.0)
Monocytes Absolute: 0.8 10*3/uL (ref 0.1–1.0)
Monocytes Relative: 7 %
Neutro Abs: 9.5 10*3/uL — ABNORMAL HIGH (ref 1.7–7.7)
Neutrophils Relative %: 77 %
Platelet Count: 193 10*3/uL (ref 150–400)
RBC: 4.27 MIL/uL (ref 4.22–5.81)
RDW: 12.6 % (ref 11.5–15.5)
WBC Count: 12.2 10*3/uL — ABNORMAL HIGH (ref 4.0–10.5)
nRBC: 0 % (ref 0.0–0.2)

## 2022-03-18 LAB — CMP (CANCER CENTER ONLY)
ALT: 13 U/L (ref 0–44)
AST: 14 U/L — ABNORMAL LOW (ref 15–41)
Albumin: 4 g/dL (ref 3.5–5.0)
Alkaline Phosphatase: 51 U/L (ref 38–126)
Anion gap: 9 (ref 5–15)
BUN: 14 mg/dL (ref 8–23)
CO2: 25 mmol/L (ref 22–32)
Calcium: 9.2 mg/dL (ref 8.9–10.3)
Chloride: 104 mmol/L (ref 98–111)
Creatinine: 1 mg/dL (ref 0.61–1.24)
GFR, Estimated: >60 mL/min (ref >60)
Glucose, Bld: 130 mg/dL — ABNORMAL HIGH (ref 70–99)
Potassium: 3.9 mmol/L (ref 3.5–5.1)
Sodium: 138 mmol/L (ref 135–145)
Total Bilirubin: 0.4 mg/dL (ref 0.3–1.2)
Total Protein: 7 g/dL (ref 6.5–8.1)

## 2022-03-18 LAB — TSH: TSH: 1.644 u[IU]/mL (ref 0.350–4.500)

## 2022-03-18 MED ORDER — DOXYCYCLINE HYCLATE 100 MG PO TABS: 100.0000 mg | ORAL_TABLET | Freq: Two times a day (BID) | ORAL | 0 refills | Status: DC

## 2022-03-18 NOTE — Progress Notes (Signed)
[facility] Telephone:([phone]) [phone]   Fax:(336) [phone]  OFFICE PROGRESS NOTE  [name], MD [place] [address]  DIAGNOSIS: Stage IIIA (T4, N0, M0) non-small cell lung cancer, favoring adenocarcinoma presented with large right lower lobe lung mass with no mediastinal lymphadenopathy or extrathoracic metastasis diagnosed in October 2022. Had disease progression with enlarging mass and new right hilar and mediastinal lymph nodes in December 2022.      PRIOR THERAPY:  1) Neoadjuvant treatment with carboplatin for AUC of 5, Alimta 500 Mg/M2 and nivolumab 360 Mg IV every 3 weeks.  First dose January 23, 2021.  Status post 3 cycles. 2) Concurrent chemoradiation with carboplatin for an AUC of 2 and paclitaxel 45 mg per metered squared.  First dose expected on 04/01/2021.  Status post 4 cycles   CURRENT THERAPY: Consolidation treatment with immunotherapy with Imfinzi 1500 Mg IV every 4 weeks.  First dose on Jul 08, 2021.  Status post 9 cycles.  INTERVAL HISTORY: Brandon Rubio. 72 y.o. male returns to the clinic today for follow-up visit.  The patient is feeling fine today with no concerning complaints except for mild cough but no significant chest pain, shortness of breath or hemoptysis.  He denied having any nausea, vomiting, diarrhea or constipation.  He has no headache or visual changes.  He denied having any recent weight loss or night sweats.  He has been tolerating his treatment with consolidation immunotherapy fairly well.  The patient is here today for evaluation with repeat CT scan of the chest for restaging of his disease.  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  MEDICATIONS:  Current Outpatient Medications  Medication Sig Dispense Refill   aspirin EC 81 MG tablet Take 81 mg by mouth in the morning. Swallow whole.     bisacodyl (DULCOLAX) 5 MG EC tablet Take 5 mg by mouth daily as needed for moderate constipation.     HYDROcodone-acetaminophen (NORCO) 5-325 MG tablet Take 1 tablet by mouth every 6 (six) hours as needed for moderate pain. 60 tablet 0   metFORMIN (GLUCOPHAGE) 1000 MG tablet TAKE 1 TABLET(1000 MG) BY MOUTH TWICE DAILY WITH A MEAL 180 tablet 1   Multiple Vitamins-Minerals (CENTRUM SILVER 50+MEN) TABS Take 1 tablet by mouth daily with breakfast.     omeprazole (PRILOSEC) 20 MG capsule TAKE 1 CAPSULE BY MOUTH TWICE DAILY BEFORE A MEAL 180 capsule 1   predniSONE (DELTASONE) 5 MG tablet Take 1.5 tablets (7.5 mg total) by mouth daily with breakfast. 135 tablet 2   prochlorperazine (COMPAZINE) 10 MG tablet TAKE 1 TABLET(10 MG) BY MOUTH EVERY 6 HOURS AS NEEDED FOR NAUSEA OR VOMITING 30 tablet 0   simvastatin (ZOCOR) 40 MG tablet TAKE 1 TABLET(40 MG) BY MOUTH DAILY AT 6 PM 90 tablet 3   No current facility-administered medications for this visit.   Facility-Administered Medications Ordered in Other Visits  Medication Dose Route Frequency Provider  Last Rate Last Admin   heparin lock flush 100 unit/mL  500 Units Intracatheter Once PRN Curt Bears, MD       sodium chloride flush (NS) 0.9 % injection 10 mL  10 mL Intracatheter PRN Curt Bears, MD   10 mL at 12/24/21 1125    SURGICAL HISTORY:  Past Surgical History:  Procedure Laterality Date   BRONCHIAL BIOPSY  12/10/2020   Procedure: BRONCHIAL BIOPSIES;  Surgeon: Collene Gobble, MD;  Location: [facility];  Service: Pulmonary;;   BRONCHIAL BRUSHINGS  12/10/2020   Procedure: BRONCHIAL BRUSHINGS;   Surgeon: Collene Gobble, MD;  Location: [facility];  Service: Pulmonary;;   BRONCHIAL NEEDLE ASPIRATION BIOPSY  12/10/2020   Procedure: BRONCHIAL NEEDLE ASPIRATION BIOPSIES;  Surgeon: Collene Gobble, MD;  Location[facility];  Service: Pulmonary;;   COLONOSCOPY     COLONOSCOPY W/ POLYPECTOMY  03/10/2009   LARYNGOSCOPY  03/10/1997   VIDEO BRONCHOSCOPY WITH ENDOBRONCHIAL NAVIGATION N/A 12/10/2020   Procedure: ROBOTIC [place];  Surgeon: Collene Gobble, MD;  Location: [facility] ENDOSCOPY;  Service: Pulmonary;  Laterality: N/A;    REVIEW OF SYSTEMS:  Constitutional: positive for fatigue Eyes: negative Ears, nose, mouth, throat, and face: negative Respiratory: positive for cough Cardiovascular: negative Gastrointestinal: negative Genitourinary:negative Integument/breast: negative Hematologic/lymphatic: negative Musculoskeletal:negative Neurological: negative Behavioral/Psych: negative Endocrine: negative Allergic/Immunologic: negative   PHYSICAL EXAMINATION: General appearance: alert, cooperative, fatigued, and no distress Head: Normocephalic, without obvious abnormality, atraumatic Neck: no adenopathy, no JVD, supple, symmetrical, trachea midline, and thyroid not enlarged, symmetric, no tenderness/mass/nodules Lymph nodes: Cervical, supraclavicular, and axillary nodes normal. Resp: clear to auscultation bilaterally Back: symmetric, no curvature. ROM normal. No CVA tenderness. Cardio: regular rate and rhythm, S1, S2 normal, no murmur, click, rub or gallop GI: soft, non-tender; bowel sounds normal; no masses,  no organomegaly Extremities: extremities normal, atraumatic, no cyanosis or edema Neurologic: Alert and oriented X 3, normal strength and tone. Normal symmetric reflexes. Normal coordination and gait  ECOG PERFORMANCE STATUS: 1 - Symptomatic but completely ambulatory  Blood pressure 127/77, pulse 100, temperature 98.3 F (36.8 C),  temperature source Oral, resp. rate 17, weight 193 lb 4 oz (87.7 kg), SpO2 93 %.  LABORATORY DATA: Lab Results  Component Value Date   WBC 12.2 (H) 03/18/2022   HGB 12.9 (L) 03/18/2022   HCT 36.9 (L) 03/18/2022   MCV 86.4 03/18/2022   PLT 193 03/18/2022      Chemistry      Component Value Date/Time   NA 140 02/18/2022 0837   K 3.5 02/18/2022 0837   CL 104 02/18/2022 0837   CO2 26 02/18/2022 0837   BUN 13 02/18/2022 0837   CREATININE 0.82 02/18/2022 0837      Component Value Date/Time   CALCIUM 9.4 02/18/2022 0837   ALKPHOS 52 02/18/2022 0837   AST 14 (L) 02/18/2022 0837   ALT 12 02/18/2022 0837   BILITOT 0.5 02/18/2022 0837       RADIOGRAPHIC STUDIES: CT Chest W Contrast  Result Date: 03/10/2022 CLINICAL DATA:  72 year old male with history of non-small cell lung cancer (adenocarcinoma of the right lung), status post chemotherapy and radiation therapy. Evaluate for treatment response. * Tracking Code: BO * EXAM: CT CHEST WITH CONTRAST TECHNIQUE: Multidetector CT imaging of the chest was performed during intravenous contrast administration. RADIATION DOSE REDUCTION: This exam was performed according to the departmental dose-optimization program which includes automated exposure control, adjustment of the mA and/or kV according to patient size and/or use of  iterative reconstruction technique. CONTRAST:  65mL OMNIPAQUE IOHEXOL 300 MG/ML  SOLN COMPARISON:  Multiple priors, most recently chest CT 12/20/2021. FINDINGS: Cardiovascular: Heart size is normal. There is no significant pericardial fluid, thickening or pericardial calcification. There is aortic atherosclerosis, as well as atherosclerosis of the great vessels of the mediastinum and the coronary arteries, including calcified atherosclerotic plaque in the left main, left anterior descending, left circumflex and right coronary arteries. Mediastinum/Nodes: No pathologically enlarged mediastinal or hilar lymph nodes. Esophagus is  unremarkable in appearance. No axillary lymphadenopathy. Lungs/Pleura: Again noted is a region of chronic volume loss, extensive architectural distortion with markedly irregular nodular septal thickening and widespread spiculations throughout the right lower lobe. There is a trace adjacent right pleural effusion (unchanged). This region is grossly similar to the most recent prior study, likely an area of chronic postradiation mass-like fibrosis, although some degree of lymphangitic spread of tumor in this region is strongly suspected. Adjacent nodule in the posterior aspect of the lateral segment of the right middle lobe (axial image 91 of series 5) currently measures 8 mm (previously 7 mm). Multiple other aggressive appearing pulmonary nodules are noted elsewhere in the lungs, particularly in the left lung, increased in size compared to the prior study, concerning for progressive metastatic disease, with the largest of these lesions in the anterior aspect of the left upper lobe (axial image 65 of series 5) measuring 1.3 x 1.1 cm on today's examination (previously 7 mm). The largest lesion in the left lower lobe (axial image 75 of series 5) currently measures 1.1 x 1.4 cm (previously only 8 mm). Diffuse bronchial wall thickening with moderate to severe centrilobular and paraseptal emphysema. No left pleural effusion. Upper Abdomen: Exophytic 3.9 cm low-attenuation lesion in the posterior aspect of the upper pole of the left kidney, compatible with a simple (Bosniak class 1) cysts, no imaging follow-up recommended. Musculoskeletal: There are no aggressive appearing lytic or blastic lesions noted in the visualized portions of the skeleton. IMPRESSION: 1. Today's study demonstrates increased size of numerous pulmonary nodules bilaterally, concerning for progressive metastatic disease in the lungs. Atypical infection is a differential consideration, but is not strongly favored at this time. 2. Relatively stable  appearance of right lower lobe which may simply represent chronic postradiation fibrosis, however, some degree of underlying lymphangitic spread of tumor in this region is strongly suspected. 3. Stable trace chronic right pleural effusion. 4. Diffuse bronchial wall thickening with moderate to severe centrilobular and paraseptal emphysema; imaging findings suggestive of underlying COPD. 5. Aortic atherosclerosis, in addition to left main and three-vessel coronary artery disease. Assessment for potential risk factor modification, dietary therapy or pharmacologic therapy may be warranted, if clinically indicated. Aortic Atherosclerosis (ICD10-I70.0) and Emphysema (ICD10-J43.9). Electronically Signed   By: Vinnie Langton M.D.   On: 03/10/2022 13:54   DG Shoulder Right  Result Date: 02/17/2022 CLINICAL DATA:  Right shoulder pain for 3-4 months. Diagnosis of right lung cancer currently undergoing treatment. EXAM: RIGHT SHOULDER - 2+ VIEW COMPARISON:  CT chest 12/20/2021 FINDINGS: Mildly decreased bone mineralization. Mild glenohumeral joint space narrowing. Mild inferior, anterior, and posterior peripheral glenoid degenerative osteophytes. Mild acromioclavicular joint space narrowing and peripheral osteophytosis. Mild calcification superolateral humeral head, possibly chronic calcific tendinosis of the superior rotator cuff. No acute fracture is seen. No dislocation. There is mild lucency overlying the visualized lateral right mid ribs. On frontal view there may be mild cortical step-off of the lateral right rib in this region. Horizontal linear densities within the right mid  lung corresponding to the scarring and posttreatment changes for lung cancer seen on prior CT. IMPRESSION: 1. Mild glenohumeral and acromioclavicular osteoarthritis. 2. Mild chronic calcific tendinosis of the superior rotator cuff. 3. Possible mild cortical step-off of the lateral aspect of the visualized right mid ribs. Recommend clinical  correlation for prior possible surgery such as thoracotomy to this region. Electronically Signed   By: Yvonne Kendall M.D.   On: 02/17/2022 09:01    ASSESSMENT AND PLAN: This is a very pleasant 72 years old white male with history of a stage IIIA (T4, N1, M0) non-small cell lung cancer, adenocarcinoma presented with right lower lobe lung mass with a right hilar and no mediastinal lymphadenopathy and no evidence of extrathoracic disease.  The patient was diagnosed in October 2022 status post 3 cycles of neoadjuvant systemic chemotherapy with carboplatin, Alimta and nivolumab but unfortunately has no evidence for significant improvement of his disease and actually has some evidence for progression. The patient underwent a course of concurrent chemoradiation weekly carboplatin for AUC of 2 and paclitaxel 45 Mg/M2.  Status post 4 cycles. The patient had a rough time with the course of concurrent chemoradiation and he was hospitalized several times during this course. Imaging studies after the course of concurrent chemoradiation showed no concerning findings for disease progression. The patient is currently undergoing treatment with immunotherapy with Imfinzi 1500 Mg IV every 4 weeks status post 9 cycles. The patient has been tolerating his treatment well with no concerning adverse effect except for mild fatigue. He had repeat CT scan of the chest performed recently.  I personally and independently reviewed the scan images and discussed the results with the patient and showed him the images today. Unfortunately his scan showed increased size of numerous pulmonary nodules bilaterally concerning for progressive disease but atypical infection is also in the consideration. I recommended for the patient to have a PET scan for further evaluation of this nodule and to rule out any disease progression but in the meantime I will start him on doxycycline 100 mg p.o. twice daily for the remote possibility of inflammatory  process. He will come back for follow-up visit in 1 months for evaluation with the PET scan. I will hold his treatment with immunotherapy for now until the result of the PET scan. The patient was advised to call immediately if he has any other concerning symptoms in the interval. The patient voices understanding of current disease status and treatment options and is in agreement with the current care plan.  All questions were answered. The patient knows to call the clinic with any problems, questions or concerns. We can certainly see the patient much sooner if necessary.   Disclaimer: This note was dictated with voice recognition software. Similar sounding words can inadvertently be transcribed and may not be corrected upon review.

## 2022-03-21 ENCOUNTER — Telehealth: Payer: Self-pay | Admitting: Internal Medicine

## 2022-03-21 DIAGNOSIS — C3491 Malignant neoplasm of unspecified part of right bronchus or lung: Secondary | ICD-10-CM

## 2022-03-21 MED ORDER — HYDROCODONE-ACETAMINOPHEN 5-325 MG PO TABS: 1.0000 | ORAL_TABLET | Freq: Four times a day (QID) | ORAL | 0 refills | Status: DC | PRN

## 2022-03-21 NOTE — Telephone Encounter (Signed)
MEDICATION: HYDROcodone-acetaminophen (NORCO) 5-325 MG tablet   PHARMACY: [facility] on [address]  Comments:   **Let patient know to contact pharmacy at the end of the day to make sure medication is ready. **  ** Please notify patient to allow 48-72 hours to process**  **Encourage patient to contact the pharmacy for refills or they can request refills through [facility]**

## 2022-03-21 NOTE — Telephone Encounter (Signed)
Sent in

## 2022-03-24 ENCOUNTER — Encounter: Payer: Self-pay | Admitting: Internal Medicine

## 2022-03-24 ENCOUNTER — Ambulatory Visit (INDEPENDENT_AMBULATORY_CARE_PROVIDER_SITE_OTHER): Payer: Medicare Other | Admitting: Internal Medicine

## 2022-03-24 VITALS — BP 110/64 | HR 104 | Ht 72.0 in | Wt 194.4 lb

## 2022-03-24 DIAGNOSIS — E1165 Type 2 diabetes mellitus with hyperglycemia: Secondary | ICD-10-CM | POA: Diagnosis not present

## 2022-03-24 DIAGNOSIS — E1159 Type 2 diabetes mellitus with other circulatory complications: Secondary | ICD-10-CM | POA: Diagnosis not present

## 2022-03-24 DIAGNOSIS — E2749 Other adrenocortical insufficiency: Secondary | ICD-10-CM

## 2022-03-24 LAB — POCT GLYCOSYLATED HEMOGLOBIN (HGB A1C): Hemoglobin A1C: 6.8 % — AB (ref 4.0–5.6)

## 2022-03-24 NOTE — Patient Instructions (Addendum)
Please continue Prednisone 7.5 mg in am.  You absolutely need to take this medication every day and not skip doses.  Please double the dose if you have a fever, for the duration of the fever.  Also, if you have significant other stress like dehydration, diarrhea, or otherwise unexplained weakness.  If you cannot take anything by mouth (vomiting) or you have severe diarrhea so that you eliminate the Prednisone pills in your stool, please make sure that you get the steroid in the vein instead - go to the nearest emergency department/urgent care or you may go to your PCPs office.  Please get a MedAlert bracelet or pendant indicating: "Adrenal insufficiency".   Please come back for a follow-up appointment in  6 months.

## 2022-03-24 NOTE — Progress Notes (Signed)
Patient ID: Brandon Soules., male   DOB: 12/29/1950, 72 y.o.   MRN: [id number]  HPI  Brandon Stapel. is a 72 y.o.-year-old male, initially referred by his PCP, Dr. Okey Dupre, returning for management of adrenal insufficiency.  Last visit months ago.  Interim history: Patient has a history of lung cancer, with history of immunotherapy, chemotherapy and then radiation treatment which was stopped due to intolerance.  He then restarted immunotherapy 1x a mo before last visit.  Unfortunately, a CT scan on 03/10/2022 showed increased size of multiple pulmonary nodules, concerning for progressive metastatic disease. At last visit, he had decreased appetite but no nausea or vomiting.  He lost 21 pounds before last visit.  The symptoms improved and he actually gained 14 pounds since then. However, he still feels weak and tired.  He occasionally misses his prednisone dose but he usually remembers to take it later in the night, when he starts feeling more tired.  Reviewed and addended history: Patient has a history of lung cancer stage III diagnosed 11/2020.  He was started on ChTx and Immuno Tx.  After 2 ChTx, he got very sick >> admitted. He then tried ChTx (but platelets decreased to low) and RxTx >> very sick >> admitted.  Of note, he got dexamethasone with his treatments. He completed 31/33 RxTx. This was resumed afterwards.  During both admissions, he was found to have dizziness and orthostatic  hypotension and had a low cortisol, which did not stimulate well with cosyntropin: Component     Latest Ref Rng & Units 05/11/2021 05/12/2021  Cortisol, Base     ug/dL  0.7  Cortisol, 30 Min     ug/dL  8.0  Cortisol, 60 Min     ug/dL  79.1  Cortisol, Plasma     ug/dL 1.1   Y728 ACTH     7.2 - 63.3 pg/mL  <1.5 (L)   MRI (05/15/2021); no pituitary mass  An adrenal insufficiency diagnosis was made and he was started on hydrocortisone: - 15 mg in am (7-8 am) - 10 mg in pm (6-7 pm)  At our first  visit, I advised him to decrease the dose to: - 10 mg in am - 10 mg in pm  However, he was admitted again afterwards for the dizziness and weakness so we increased his hydrocortisone back to: - 15 mg in am - 10 mg in pm   In 06/2021, he contacted me with abdominal pain from hydrocortisone and also increased sweating.  I suggested prednisone: -Currently 7.5 mg daily  He was also on Midodrine, now off.  No h/o po ketoconazole, phenytoin, rifampin, chronic fluconazole use. No h/o autoimmune diseases in pt or family mbs. No excess use of NSAIDs. No h/o generalized infections or HIV. No IVDA. No h/o head injury or severe HA.  At our first visit in 05/2021 he mentioned: - + weight loss - lost 20 lbs in last 1.5 mo - + fatigue - + nausea - no vomiting - + abdominal pain - + mm aches (legs and back) - no palpitations - + mild HAs - + dizziness - + presyncopal episodes (when taking a shower) - + SOB with minimal exertion  At today's visit, he still has fatigue and weakness, but the rest of the symptoms improved.  No hyponatremia:   Chemistry      Component Value Date/Time   NA 138 03/18/2022 0951   K 3.9 03/18/2022 0951   CL 104 03/18/2022 0951   CO2  25 03/18/2022 0951   BUN 14 03/18/2022 0951   CREATININE 1.00 03/18/2022 0951      Component Value Date/Time   CALCIUM 9.2 03/18/2022 0951   ALKPHOS 51 03/18/2022 0951   AST 14 (L) 03/18/2022 0951   ALT 13 03/18/2022 0951   BILITOT 0.4 03/18/2022 0951     Pt. also has a history of DM2 with history of DKA.  However, this is more controlled lately: Lab Results  Component Value Date   HGBA1C 6.1 (H) 05/27/2021   HGBA1C 5.9 (H) 05/11/2021   HGBA1C 9.2 (H) 03/07/2021   He is on: - Metformin 500 >> 1000 mg 2x a day -  >> stopped due to good control -  >> stopped due to good control  He has a history of hyperlipidemia for which she is on Zocor 40 mg daily. He was found to have aortic atherosclerosis on the CT scan from  03/10/2022. He also has a history of pancytopenia. He had COVID-19 in 02/2021.  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   Peripheral neuropathy    toes   Past Surgical History:  Procedure Laterality Date   BRONCHIAL BIOPSY  12/10/2020   Procedure: BRONCHIAL BIOPSIES;  Surgeon: Leslye Peer, MD;  Location: [facility] ENDOSCOPY;  Service: Pulmonary;;   BRONCHIAL BRUSHINGS  12/10/2020   Procedure: BRONCHIAL BRUSHINGS;  Surgeon: Leslye Peer, MD;  Location: [facility] ENDOSCOPY;  Service: Pulmonary;;   BRONCHIAL NEEDLE ASPIRATION BIOPSY  12/10/2020   Procedure: BRONCHIAL NEEDLE ASPIRATION BIOPSIES;  Surgeon: Leslye Peer, MD;  Location: [facility] ENDOSCOPY;  Service: Pulmonary;;   COLONOSCOPY     COLONOSCOPY W/ POLYPECTOMY  03/10/2009   LARYNGOSCOPY  03/10/1997   VIDEO BRONCHOSCOPY WITH ENDOBRONCHIAL NAVIGATION N/A 12/10/2020   Procedure: ROBOTIC VIDEO BRONCHOSCOPY WITH ENDOBRONCHIAL NAVIGATION;  Surgeon: Leslye Peer, MD;  Location: [facility] ENDOSCOPY;  Service: Pulmonary;  Laterality: N/A;   Social History   Socioeconomic History   Marital status: Divorced    Spouse name: Not on file   Number of children: 0   Years of education: Not on file   Highest education level: Not on file  Occupational History   Occupation: Retired Personnel officer  Tobacco Use   Smoking status: Every Day    Packs/day: 2.00    Years: 58.00    Total pack years: 116.00    Types: Cigarettes   Smokeless tobacco: Never   Tobacco comments:    2+ packs smoked daily ARJ 12/05/20  Vaping Use   Vaping Use: Never used  Substance and Sexual Activity   Alcohol use: No     Alcohol/week: 0.0 standard drinks of alcohol    Comment:  none since 04/2012   Drug use: Never   Sexual activity: Not on file  Other Topics Concern   Not on file  Social History Narrative   REG EXERCISE         Social Determinants of Health   Financial Resource Strain: Low Risk  (06/26/2021)   Overall Financial Resource Strain (CARDIA)    Difficulty of Paying Living  Expenses: Not hard at all  Food Insecurity: No Food Insecurity (06/26/2021)   Hunger Vital Sign    Worried About Running Out of Food in the Last Year: Never true    Ran Out of Food in the Last Year: Never true  Transportation Needs: No Transportation Needs (06/26/2021)   PRAPARE - Administrator, Civil Service (Medical): No    Lack of Transportation (Non-Medical): No  Physical Activity: Sufficiently Active (06/26/2021)   Exercise Vital Sign    Days of Exercise per Week: 3 days    Minutes of Exercise per Session: 60 min  Stress: No Stress Concern Present (06/26/2021)   [employer] of Occupational Health - Occupational Stress Questionnaire    Feeling of Stress : Not at all  Social Connections: Socially Isolated (06/26/2021)   Social Connection and Isolation Panel [NHANES]    Frequency of Communication with Friends and Family: More than three times a week    Frequency of Social Gatherings with Friends and Family: Three times a week    Attends Religious Services: Never    Active Member of Clubs or Organizations: No    Attends Banker Meetings: Never    Marital Status: Divorced  Catering manager Violence: Not At Risk (06/26/2021)   Humiliation, Afraid, Rape, and Kick questionnaire    Fear of Current or Ex-Partner: No    Emotionally Abused: No    Physically Abused: No    Sexually Abused: No   Current Outpatient Medications on File Prior to Visit  Medication Sig Dispense Refill   aspirin EC 81 MG tablet Take 81 mg by mouth in the morning. Swallow whole.     bisacodyl (DULCOLAX) 5 MG EC  tablet Take 5 mg by mouth daily as needed for moderate constipation.     doxycycline (VIBRA-TABS) 100 MG tablet Take 1 tablet (100 mg total) by mouth 2 (two) times daily. 30 tablet 0   HYDROcodone-acetaminophen (NORCO) 5-325 MG tablet Take 1 tablet by mouth every 6 (six) hours as needed for moderate pain. 60 tablet 0   metFORMIN (GLUCOPHAGE) 1000 MG tablet TAKE 1 TABLET(1000 MG) BY MOUTH TWICE DAILY WITH A MEAL 180 tablet 1   Multiple Vitamins-Minerals (CENTRUM SILVER 50+MEN) TABS Take 1 tablet by mouth daily with breakfast.     omeprazole (PRILOSEC) 20 MG capsule TAKE 1 CAPSULE BY MOUTH TWICE DAILY BEFORE A MEAL 180 capsule 1   predniSONE (DELTASONE) 5 MG tablet Take 1.5 tablets (7.5 mg total) by mouth daily with breakfast. 135 tablet 2   prochlorperazine (COMPAZINE) 10 MG tablet TAKE 1 TABLET(10 MG) BY MOUTH EVERY 6 HOURS AS NEEDED FOR NAUSEA OR VOMITING 30 tablet 0   simvastatin (ZOCOR) 40 MG tablet TAKE 1 TABLET(40 MG) BY MOUTH DAILY AT 6 PM 90 tablet 3   Current Facility-Administered Medications on File Prior to Visit  Medication Dose Route Frequency Provider Last Rate Last Admin   heparin lock flush 100 unit/mL  500 Units Intracatheter Once PRN [name], MD       sodium chloride flush (NS) 0.9 % injection 10 mL  10 mL Intracatheter PRN [name], MD   10 mL at 12/24/21 1125   Allergies  Allergen Reactions   Penicillins Anaphylaxis and Swelling    THE THROAT SWELLS!!!! Because of a history of documented adverse serious drug reaction;[name] Alert bracelet  is recommended   Albumin (Human) Hives and Itching   Family History  Problem Relation Age of Onset   Lung cancer Mother  X 2   Colon polyps Father    Colon cancer Father 51   Coronary artery disease Father    Lung disease Father    Esophageal cancer Neg Hx    Stomach cancer Neg Hx    Rectal cancer Neg Hx    PE: BP 110/64 (BP Location: Right Arm, Patient Position: Sitting, Cuff Size: Normal)   Pulse (!) 104    Ht 6' (1.829 m)   Wt 194 lb 6.4 oz (88.2 kg)   SpO2 97%   BMI 26.37 kg/m  Wt Readings from Last 3 Encounters:  03/24/22 194 lb 6.4 oz (88.2 kg)  03/18/22 193 lb 4 oz (87.7 kg)  02/18/22 190 lb 11.2 oz (86.5 kg)   Constitutional: overweight, in NAD Eyes:  EOMI, no exophthalmos ENT: no neck masses, no cervical lymphadenopathy Cardiovascular: tachycardia, RR, No MRG Respiratory: CTA L lung, faint wheezes R lower lung Musculoskeletal: no deformities Skin:no rashes Neurological: no tremor with outstretched hands  ASSESSMENT: 1. Adrenal insufficiency  2. DM2, non-insulin-dependent, with complications - Ao atherosclerosis  PLAN:  1. Adrenal insufficiency (AI) -Most likely due to steroid use (dexamethasone) used during chemotherapy for his lung cancer -His cortisol level did not stimulate well with cosyntropin (expected level of cortisol of >14.7), consistent with a diagnosis of adrenal insufficiency.  His ACTH was low, indicating a central etiology.  He was started on hydrocortisone in the hospital. -He has a history of significant dizziness, weakness, disequilibrium, which she experienced despite starting hydrocortisone.  At our first visit, he was taking 15 mg in a.m. and 10 mg in p.m.  We discussed about trying to decrease the dose slowly.  He tried 10 mg in a.m. and 10 mg in p.m. but again developed significant weakness and presented to the emergency room.  This could have been due to his radiation treatments since he presented with similar symptoms before.  However, due to this, we increased his hydrocortisone back to 15 mg in am and 10 mg in p.m. however, he could not tolerate hydrocortisone due to abdominal pain and swelling -Before last visit, we switched to prednisone and he was feeling much better on 7.5 mg daily. -No nausea and vomiting but he does have decreased appetite.  At last visit he was telling me that he was eating 1 meal a day + some Jell-O.  Before last visit, he lost  21 pounds.  He was also quite weak and I suspected deconditioning.  We discussed about trying to build up muscle and I recommended strength exercises.  Since last visit, however, he gained 14 pounds.  He did not start exercise and I again recommended to do so.  He mentions back pain and we discussed about possibly using a stationary bike or try swimming.  He will think about it. -At last visit, we discussed about continuing prednisone.  Due to the weakness/fatigue, I advised him to stay on the same dose.  I doubt that he can come off the steroid in the near future. -I again reiterated sick day rules: - You absolutely need to take this medication every day and not skip doses. - Please double the dose if you have a fever, for the duration of the fever. - If you cannot take anything by mouth (vomiting) or you have severe diarrhea so that you eliminate the prednisone pills in your stool, please make sure that you get the steroid in the vein instead - go to the nearest emergency department/urgent care or you may go  to your PCPs office  - Please try to get a MedAlert bracelet or pendant indicating: "Adrenal insufficiency".  -I previously sent hydrocortisone solution to his pharmacy for situations when he could not take p.o.  Unfortunately, hydrocortisone and dexamethasone were not available and we discussed about going to the nearest emergency room or an urgent care to get steroids parenterally if needed.  At today's visit, I offered to send an iv hydrocortisone prescription to his pharmacy, but he mentions that he would prefer to go to urgent care if he cannot take p.o. steroids. -I did advise him to get a med alert bracelet or pendant mentioning adrenal insufficiency.  He only has a wallet card. -I plan to see him back in 6 months  2. DM -Reviewed latest HbA1c-at goal: Lab Results  Component Value Date   HGBA1C 6.1 (H) 05/27/2021  -He was previously on insulin and SGLT2 inhibitor, but these were stopped  due to good control -His currently on metformin only -This is followed by PCP.  However, he did not have an HbA1c in the last 10 months, so we checked one today: 6.8%, higher, as he does mention that he relaxed his diet.  We discussed about the need to improve it, but for now, we can continue metformin without escalating the treatment.  [name], MD PhD [facility]

## 2022-03-25 ENCOUNTER — Other Ambulatory Visit: Payer: Self-pay

## 2022-04-07 ENCOUNTER — Ambulatory Visit (HOSPITAL_COMMUNITY)
Admission: RE | Admit: 2022-04-07 | Discharge: 2022-04-07 | Disposition: A | Discharge status: Home / Self Care | Payer: Medicare Other | Source: Ambulatory Visit | Attending: Internal Medicine | Admitting: Internal Medicine

## 2022-04-07 DIAGNOSIS — C3490 Malignant neoplasm of unspecified part of unspecified bronchus or lung: Secondary | ICD-10-CM | POA: Insufficient documentation

## 2022-04-07 DIAGNOSIS — J439 Emphysema, unspecified: Secondary | ICD-10-CM | POA: Diagnosis not present

## 2022-04-07 DIAGNOSIS — I700 Atherosclerosis of aorta: Secondary | ICD-10-CM | POA: Insufficient documentation

## 2022-04-07 DIAGNOSIS — I2510 Atherosclerotic heart disease of native coronary artery without angina pectoris: Secondary | ICD-10-CM | POA: Insufficient documentation

## 2022-04-07 DIAGNOSIS — J90 Pleural effusion, not elsewhere classified: Secondary | ICD-10-CM | POA: Diagnosis not present

## 2022-04-07 DIAGNOSIS — J432 Centrilobular emphysema: Secondary | ICD-10-CM | POA: Diagnosis not present

## 2022-04-07 DIAGNOSIS — R911 Solitary pulmonary nodule: Secondary | ICD-10-CM | POA: Diagnosis not present

## 2022-04-07 LAB — GLUCOSE, CAPILLARY: Glucose-Capillary: 140 mg/dL — ABNORMAL HIGH (ref 70–99)

## 2022-04-07 MED ORDER — FLUDEOXYGLUCOSE F - 18 (FDG) INJECTION
9.7000 | Freq: Once | INTRAVENOUS | Status: AC | PRN
  Administered 2022-04-07: 9.73 via INTRAVENOUS

## 2022-04-08 ENCOUNTER — Encounter: Payer: Self-pay | Admitting: Internal Medicine

## 2022-04-12 NOTE — Progress Notes (Unsigned)
[facility] OFFICE PROGRESS NOTE  Brandon Koch, MD [place] [address]  DIAGNOSIS: Recurrent/metastatic Non-Small Cell Lung Cancer, adenocarcinoma. He initially presented as  Stage IIIA (T4, N0, M0) non-small cell lung cancer, favoring adenocarcinoma presented with large right lower lobe lung mass with no mediastinal lymphadenopathy or extrathoracic metastasis diagnosed in October 2022. Had disease progression with enlarging mass and new right hilar and mediastinal lymph nodes in December 2022. He was found to have bilateral pulmonary nodules indicative of metastatic disease in January 2024  Foundation One: No actionable mutations  PRIOR THERAPY: 1) Neoadjuvant treatment with carboplatin for AUC of 5, Alimta 500 Mg/M2 and nivolumab 360 Mg IV every 3 weeks.  First dose January 23, 2021.  Status post 3 cycles. 2) Concurrent chemoradiation with carboplatin for an AUC of 2 and paclitaxel 45 mg per metered squared.  First dose expected on 04/01/2021.  Status post 4 cycles 3) Consolidation treatment with immunotherapy with Imfinzi 1500 Mg IV every 4 weeks.  First dose on Jul 08, 2021.  Status post 9 cycles.   CURRENT THERAPY: Palliative systemic chemotherapy with carboplatin for an AUC of 5 Alimta 500 mg/m, Keytruda 200 mg IV every 3 weeks.  First dose expected on 04/22/2022.  INTERVAL HISTORY: Brandon Rubio. 72 y.o. male returns to the clinic today for a follow-up visit.  The patient was last seen by Dr. Julien Nordmann on 03/18/2022.  At that point in time, the patient had a restaging CT scan which showed concern for disease progression with enlarging bilateral pulmonary nodules.  Therefore, Dr. Julien Nordmann recommended holding his immunotherapy and start him on doxycycline for possible inflammatory process and arrange for PET scan to further evaluate this.  Since last being seen the patient denies any major changes in his health.  He did see his endocrinologist for  his adrenal insufficiency.  He denies any new fevers, chills, night sweats, or unexplained weight loss.   He reports his baseline cough which produces clear sputum in the AM.  He denies any chest pain or hemoptysis.  He reports stable dyspnea on exertion. He states overall his breathing is the same. Denies any nausea, vomiting, diarrhea, or constipation.  Denies any headache or visual changes.  Denies any rashes or skin changes.  He is here today for evaluation to review his PET scan and for a more detailed discussion about his current condition and recommended treatment options.  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  MEDICATIONS:  Current Outpatient Medications  Medication Sig Dispense Refill   folic acid (FOLVITE) 1 MG tablet Take 1 tablet (1 mg total) by mouth daily. 30 tablet 2   aspirin EC 81 MG tablet Take 81 mg by mouth in the morning. Swallow whole.     bisacodyl (DULCOLAX) 5 MG EC tablet Take 5 mg by mouth daily as needed for moderate constipation.     HYDROcodone-acetaminophen (NORCO) 5-325 MG tablet Take 1 tablet by mouth  every 6 (six) hours as needed for moderate pain. 60 tablet 0   metFORMIN (GLUCOPHAGE) 1000 MG tablet TAKE 1 TABLET(1000 MG) BY MOUTH TWICE DAILY WITH A MEAL 180 tablet 1   Multiple Vitamins-Minerals (CENTRUM SILVER 50+MEN) TABS Take 1 tablet by mouth daily with breakfast.     omeprazole (PRILOSEC) 20 MG capsule TAKE 1 CAPSULE BY MOUTH TWICE DAILY BEFORE A MEAL 180 capsule 1   predniSONE (DELTASONE) 5 MG tablet Take 1.5  tablets (7.5 mg total) by mouth daily with breakfast. 135 tablet 2   prochlorperazine (COMPAZINE) 10 MG tablet TAKE 1 TABLET(10 MG) BY MOUTH EVERY 6 HOURS AS NEEDED FOR NAUSEA OR VOMITING (Patient not taking: Reported on 04/15/2022) 30 tablet 0   simvastatin (ZOCOR) 40 MG tablet TAKE 1 TABLET(40 MG) BY MOUTH DAILY AT 6 PM 90 tablet 3   No current facility-administered medications for this visit.   Facility-Administered Medications Ordered in Other Visits  Medication Dose Route Frequency Provider Last Rate Last Admin   cyanocobalamin (VITAMIN B12) injection 1,000 mcg  1,000 mcg Intramuscular Once Raylee Adamec L, PA-C        SURGICAL HISTORY:  Past Surgical History:  Procedure Laterality Date   BRONCHIAL BIOPSY  12/10/2020   Procedure: BRONCHIAL BIOPSIES;  Surgeon: Collene Gobble, MD;  Location: [facility] ENDOSCOPY;  Service: Pulmonary;;   BRONCHIAL BRUSHINGS  12/10/2020   Procedure: BRONCHIAL BRUSHINGS;  Surgeon: Collene Gobble, MD;  Location: [facility] ENDOSCOPY;  Service: Pulmonary;;   BRONCHIAL NEEDLE ASPIRATION BIOPSY  12/10/2020   Procedure: BRONCHIAL NEEDLE ASPIRATION BIOPSIES;  Surgeon: Collene Gobble, MD;  Location: [facility] ENDOSCOPY;  Service: Pulmonary;;   COLONOSCOPY     COLONOSCOPY W/ POLYPECTOMY  03/10/2009   LARYNGOSCOPY  03/10/1997   VIDEO BRONCHOSCOPY WITH ENDOBRONCHIAL NAVIGATION N/A 12/10/2020   Procedure: ROBOTIC VIDEO BRONCHOSCOPY WITH ENDOBRONCHIAL NAVIGATION;  Surgeon: Collene Gobble, MD;  Location: [facility] ENDOSCOPY;  Service: Pulmonary;  Laterality: N/A;    REVIEW OF SYSTEMS:   Constitutional: Positive for stable fatigue.  Negative for appetite change, chills,  fever and unexpected weight change.  HENT: Negative for mouth sores, nosebleeds, sore throat and trouble swallowing.   Eyes: Negative for eye problems and icterus.  Respiratory: Positive for stable mild cough and dyspnea exertion.  Negative for hemoptysis and wheezing.   Cardiovascular: Negative for chest pain and  leg swelling.  Gastrointestinal: Negative for abdominal pain, constipation, diarrhea, nausea and vomiting.  Genitourinary: Negative for bladder incontinence, difficulty urinating, dysuria, frequency and hematuria.   Musculoskeletal: Positive for chronic occasional back and right shoulder pain.  Negative for gait problem, neck pain and neck stiffness.  Skin: Negative for itching and rash.  Neurological: Negative for dizziness, extremity weakness, gait problem, headaches, light-headedness and seizures.  Hematological: Negative for adenopathy. Does not bruise/bleed easily.  Psychiatric/Behavioral: Negative for confusion, depression and sleep disturbance. The patient is not nervous/anxious.     PHYSICAL EXAMINATION:  Blood pressure 131/76, pulse 93, temperature 97.7 F (36.5 C), temperature source Oral, resp. rate 18, height 6' (1.829 m), weight 196 lb 4.8 oz (89 kg), SpO2 93 %.  ECOG PERFORMANCE STATUS: 1  Physical Exam  Constitutional: Oriented to person, place, and time and well-developed, well-nourished, and in no distress.  HENT:  Head: Normocephalic and atraumatic.  Mouth/Throat: Oropharynx is clear and moist. No oropharyngeal exudate.  Eyes: Conjunctivae are normal. Right eye exhibits no discharge. Left eye exhibits no discharge. No scleral icterus.  Neck: Normal range of motion. Neck supple.  Cardiovascular: Normal rate,  regular rhythm, normal heart sounds and intact distal pulses.   Pulmonary/Chest: Effort normal. Quiet breath sounds bilaterally. No respiratory distress. No wheezes. No rales.  Abdominal: Soft. Bowel sounds are normal. Exhibits no distension and no mass. There is no tenderness.  Musculoskeletal: Normal range of motion. Exhibits no edema.  Lymphadenopathy:    No cervical adenopathy.  Neurological: Alert and oriented to person, place, and time. Exhibits normal muscle tone. Gait normal. Coordination normal.  Skin: Skin is warm and dry. No rash noted. Not diaphoretic.  No erythema. No pallor.  Psychiatric: Mood, memory and judgment normal.  Vitals reviewed.  LABORATORY DATA: Lab Results  Component Value Date   WBC 8.2 04/15/2022   HGB 13.1 04/15/2022   HCT 37.1 (L) 04/15/2022   MCV 86.3 04/15/2022   PLT 175 04/15/2022      Chemistry      Component Value Date/Time   NA 138 04/15/2022 0819   K 3.7 04/15/2022 0819   CL 103 04/15/2022 0819   CO2 25 04/15/2022 0819   BUN 10 04/15/2022 0819   CREATININE 0.87 04/15/2022 0819      Component Value Date/Time   CALCIUM 9.3 04/15/2022 0819   ALKPHOS 47 04/15/2022 0819   AST 13 (L) 04/15/2022 0819   ALT 13 04/15/2022 0819   BILITOT 0.4 04/15/2022 0819       RADIOGRAPHIC STUDIES:  NM PET Image Restage (PS) Skull Base to Thigh (F-18 FDG)  Result Date: 04/08/2022 CLINICAL DATA:  Subsequent treatment strategy for non-small cell lung cancer. EXAM: NUCLEAR MEDICINE PET SKULL BASE TO THIGH TECHNIQUE: 9.7 mCi F-18 FDG was injected intravenously. Full-ring PET imaging was performed from the skull base to thigh after the radiotracer. CT data was obtained and used for attenuation correction and anatomic localization. Fasting blood glucose: 140 mg/dl COMPARISON:  CT chest 03/07/2022 and PET 11/28/2020. FINDINGS: Mediastinal blood pool activity: SUV max 2.6 Liver activity: SUV max NA NECK: No abnormal hypermetabolism. Incidental CT findings: None. CHEST: Hypermetabolic pulmonary nodules, left greater than right. Index nodule in the left lower lobe measures approximately 1.2 x 1.3 cm (7/35), SUV max 2.8. Subpleural consolidation in the superior segment right lower lobe and adjacent small loculated right pleural effusion are mildly hypermetabolic (6/54), SUV max 4.0. No definite hypermetabolic mediastinal, hilar or axillary lymph nodes. Incidental CT findings: Atherosclerotic calcification of the aorta, aortic valve and coronary arteries. Heart is at the upper limits of normal in size to mildly enlarged. No pericardial  effusion. Centrilobular emphysema. Partially calcified post treatment scarring in the inferior right lower lobe. ABDOMEN/PELVIS: No abnormal hypermetabolism. Incidental CT findings: Liver, gallbladder, adrenal glands and right kidney are unremarkable. 4.1 cm low-attenuation lesion in the left kidney. No specific follow-up necessary. Spleen, pancreas, stomach and bowel are grossly unremarkable. SKELETON: No abnormal hypermetabolism. Incidental CT findings: Degenerative changes in the spine. IMPRESSION: 1. Hypermetabolic bilateral pulmonary nodules, indicative of metastatic disease. 2. Mildly hypermetabolic consolidation in the superior segment right lower lobe, above previously treated disease, also worrisome for disease recurrence. 3. Small loculated right pleural effusion. 4.  Aortic atherosclerosis (ICD10-I70.0). Coronary artery calcification. 5.  Emphysema (ICD10-J43.9). Electronically Signed   By: Lorin Picket M.D.   On: 04/08/2022 10:23     ASSESSMENT/PLAN:  This is a very pleasant 72 year old Caucasian male with recurrent and metastatic non-small cell lung cancer, adenocarcinoma.  He was initially diagnosed as a stage IIIa (T4, N1, M0).  He presented with a right upper lobe mass and right hilar and mediastinal lymphadenopathy.  He was initially diagnosed in October 2022.  The patient developed metastatic disease with bilateral pulmonary nodules in January 2024. He does not have any actionable mutations by foundation one.   The patient initially underwent 3 cycles of neoadjuvant systemic chemotherapy with carboplatin, Alimta, nivolumab.  Unfortunately the patient did not have significant improvement in his disease and actually developed disease progression.  He then underwent a course of concurrent chemoradiation.  He is currently on consolidation immunotherapy with Imfinzi 1500 mg IV every 4 weeks and is status post 9 cycles.  At the patient's last appointment, he had a restaging CT scan that  showed suspicious findings for disease progression.  He had a PET scan to further evaluate this.  The patient was seen with Dr. Julien Nordmann today.  Dr. Julien Nordmann personally independently reviewed the PET scan discussed the results with the patient today.  The scan showed disease progression with bilateral pulmonary nodules.   Dr. Julien Nordmann had a lengthly discussion with the patient today about her current condition and treatment options. The patient was given the option of treatment with systemic chemotherapy with carboplatin for an AUC of 5, Alimta 500 mg/m, and Keytruda 200 mg IV every 3 weeks.  The patient is interested in proceeding with systemic chemotherapy.  He is expected to start her first dose of this treatment on 04/22/22.   We discussed the adverse side effects of treatment including but not limited to alopecia, myelosuppression, nausea and vomiting, peripheral neuropathy, liver or renal dysfunction as well as immunotherapy mediated adverse effects. He has been on these medications in the past/similar medications.    We will arrange for the patient to have a B12 injection while in the clinic today.     I sent prescriptions for 1 mg folic acid p.o. daily. He states he has enough compazine at home, therefore, I did not send this prescription.   The patient will follow-up in 4 weeks for evaluation and repeat blood work before starting cycle #2.   He knows to call us in the interval if he needs to be evaluated sooner than his scheduled appointment on 05/13/22.   The patient was advised to call immediately if she has any concerning symptoms in the interval. The patient voices understanding of current disease status and treatment options and is in agreement with the current care plan. All questions were answered. The patient knows to call the clinic with any problems, questions or concerns. We can certainly see the patient much sooner if necessary  Orders Placed This Encounter  Procedures   CBC  with Differential ([facility] Only)    Standing Status:   Future    Standing Expiration Date:   04/23/2023   CMP ([facility] only)    Standing Status:   Future    Standing Expiration Date:   04/23/2023   T4    Standing Status:   Future    Standing Expiration Date:   04/23/2023   TSH    Standing Status:   Future    Standing Expiration Date:   04/23/2023   CBC with Differential ([facility] Only)    Standing Status:   Future    Standing Expiration Date:   05/14/2023   CMP ([facility] only)    Standing Status:   Future    Standing Expiration Date:   05/14/2023   CBC with Differential ([facility] Only)    Standing Status:   Future    Standing Expiration Date:   06/04/2023   CMP (  [place] [place] only)    Standing Status:   Future    Standing Expiration Date:   06/04/2023   T4    Standing Status:   Future    Standing Expiration Date:   06/04/2023   TSH    Standing Status:   Future    Standing Expiration Date:   06/04/2023   CBC with Differential ([facility] Only)    Standing Status:   Future    Standing Expiration Date:   06/25/2023   CMP ([facility] only)    Standing Status:   Future    Standing Expiration Date:   06/25/2023   CBC with Differential ([facility] Only)    Standing Status:   Standing    Number of Occurrences:   12    Standing Expiration Date:   04/16/2023   CMP ([facility] only)    Standing Status:   Standing    Number of Occurrences:   12    Standing Expiration Date:   04/16/2023      Tobe Sos Riyad Keena, PA-C 04/15/22  ADDENDUM: Hematology/Oncology Attending: I had a face-to-face encounter with the patient today.  I reviewed his record, lab, scans and recommended his care plan.  This is a very pleasant 72 years old white male with recurrent/metastatic non-small cell lung cancer, adenocarcinoma with no actionable mutations that was initially diagnosed as stage IIIa non-small cell lung cancer and October 2022.  The patient had neoadjuvant  treatment at that time with carboplatin, Alimta and nivolumab but he has no shrinkage of his tumor to become a surgical candidate.  We proceeded with a course of concurrent chemoradiation with weekly carboplatin and paclitaxel followed by 9 cycles of consolidation treatment with immunotherapy with Imfinzi but this treatment was discontinued recently secondary to disease progression. The patient was found on recent imaging studies to have new and enlarging pulmonary nodules.  I ordered a PET scan which was performed recently and the patient is here today for evaluation and discussion of his PET scan results and treatment options.  I personally and independently reviewed the PET scan imaging and discussed the result with the patient today.  The PET scan showed hypermetabolic bilateral pulmonary nodules indicative of metastatic disease in addition to mildly hypermetabolic consolidation in the superior segment of the right lower lobe with suspicious disease recurrence locally in that area as well as a small loculated right pleural effusion. I recommended for the patient to discontinue his current treatment with Imfinzi. I discussed with him other treatment options and recommended for him first-line treatment with systemic chemotherapy in combination with immunotherapy in the form of carboplatin for AUC of 5, Alimta 500 Mg/M2 and Keytruda 200 Mg IV every 3 weeks. I discussed with the patient the adverse effect of this treatment including but not limited to alopecia, myelosuppression, nausea and vomiting, peripheral neuropathy, liver or renal dysfunction. The patient is interested in the treatment.  He was also given the option of palliative care but he would like to proceed with the chemotherapy. He is expected to start the first cycle of this treatment next week. The patient will receive vitamin B12 injection today and will call his pharmacy with prescription for folic acid 1 mg p.o. daily in addition to nausea  medicine. The patient will come back for follow-up visit in 4 weeks for evaluation with the start of cycle #2. He was advised to call immediately if he has any concerning symptoms in the interval. The total time spent in the  appointment was 30 minutes. Disclaimer: This note was dictated with voice recognition software. Similar sounding words can inadvertently be transcribed and may be missed upon review. Eilleen Kempf, MD

## 2022-04-15 ENCOUNTER — Inpatient Hospital Stay: Payer: Medicare Other

## 2022-04-15 ENCOUNTER — Inpatient Hospital Stay (HOSPITAL_BASED_OUTPATIENT_CLINIC_OR_DEPARTMENT_OTHER): Payer: Medicare Other | Admitting: Physician Assistant

## 2022-04-15 ENCOUNTER — Other Ambulatory Visit: Payer: Self-pay

## 2022-04-15 ENCOUNTER — Inpatient Hospital Stay: Payer: Medicare Other | Attending: Internal Medicine

## 2022-04-15 VITALS — BP 131/76 | HR 93 | Temp 97.7°F | Resp 18 | Ht 72.0 in | Wt 196.3 lb

## 2022-04-15 DIAGNOSIS — Z7189 Other specified counseling: Secondary | ICD-10-CM

## 2022-04-15 DIAGNOSIS — C3491 Malignant neoplasm of unspecified part of right bronchus or lung: Secondary | ICD-10-CM | POA: Diagnosis not present

## 2022-04-15 DIAGNOSIS — C3431 Malignant neoplasm of lower lobe, right bronchus or lung: Secondary | ICD-10-CM | POA: Insufficient documentation

## 2022-04-15 DIAGNOSIS — C3490 Malignant neoplasm of unspecified part of unspecified bronchus or lung: Secondary | ICD-10-CM | POA: Diagnosis not present

## 2022-04-15 LAB — CBC WITH DIFFERENTIAL (CANCER CENTER ONLY)
Abs Immature Granulocytes: 0.02 10*3/uL (ref 0.00–0.07)
Basophils Absolute: 0.1 10*3/uL (ref 0.0–0.1)
Basophils Relative: 1 %
Eosinophils Absolute: 0.3 10*3/uL (ref 0.0–0.5)
Eosinophils Relative: 3 %
HCT: 37.1 % — ABNORMAL LOW (ref 39.0–52.0)
Hemoglobin: 13.1 g/dL (ref 13.0–17.0)
Immature Granulocytes: 0 %
Lymphocytes Relative: 23 %
Lymphs Abs: 1.9 10*3/uL (ref 0.7–4.0)
MCH: 30.5 pg (ref 26.0–34.0)
MCHC: 35.3 g/dL (ref 30.0–36.0)
MCV: 86.3 fL (ref 80.0–100.0)
Monocytes Absolute: 0.6 10*3/uL (ref 0.1–1.0)
Monocytes Relative: 8 %
Neutro Abs: 5.3 10*3/uL (ref 1.7–7.7)
Neutrophils Relative %: 65 %
Platelet Count: 175 10*3/uL (ref 150–400)
RBC: 4.3 MIL/uL (ref 4.22–5.81)
RDW: 12.5 % (ref 11.5–15.5)
WBC Count: 8.2 10*3/uL (ref 4.0–10.5)
nRBC: 0 % (ref 0.0–0.2)

## 2022-04-15 LAB — CMP (CANCER CENTER ONLY)
ALT: 13 U/L (ref 0–44)
AST: 13 U/L — ABNORMAL LOW (ref 15–41)
Albumin: 3.8 g/dL (ref 3.5–5.0)
Alkaline Phosphatase: 47 U/L (ref 38–126)
Anion gap: 10 (ref 5–15)
BUN: 10 mg/dL (ref 8–23)
CO2: 25 mmol/L (ref 22–32)
Calcium: 9.3 mg/dL (ref 8.9–10.3)
Chloride: 103 mmol/L (ref 98–111)
Creatinine: 0.87 mg/dL (ref 0.61–1.24)
GFR, Estimated: >60 mL/min (ref >60)
Glucose, Bld: 122 mg/dL — ABNORMAL HIGH (ref 70–99)
Potassium: 3.7 mmol/L (ref 3.5–5.1)
Sodium: 138 mmol/L (ref 135–145)
Total Bilirubin: 0.4 mg/dL (ref 0.3–1.2)
Total Protein: 6.4 g/dL — ABNORMAL LOW (ref 6.5–8.1)

## 2022-04-15 MED ORDER — FOLIC ACID 1 MG PO TABS: 1.0000 mg | ORAL_TABLET | Freq: Every day | ORAL | 2 refills | Status: DC

## 2022-04-15 MED ORDER — CYANOCOBALAMIN 1000 MCG/ML IJ SOLN
1000.0000 ug | Freq: Once | INTRAMUSCULAR | Status: AC
  Administered 2022-04-15: 1000 ug via INTRAMUSCULAR
  Filled 2022-04-15: qty 1

## 2022-04-15 NOTE — Progress Notes (Signed)
Pharmacist Chemotherapy Monitoring - Initial Assessment    Anticipated start date: 04/22/22   The following has been reviewed per standard work regarding the patient's treatment regimen: The patient's diagnosis, treatment plan and drug doses, and organ/hematologic function Lab orders and baseline tests specific to treatment regimen  The treatment plan start date, drug sequencing, and pre-medications Prior authorization status  Patient's documented medication list, including drug-drug interaction screen and prescriptions for anti-emetics and supportive care specific to the treatment regimen The drug concentrations, fluid compatibility, administration routes, and timing of the medications to be used The patient's access for treatment and lifetime cumulative dose history, if applicable  The patient's medication allergies and previous infusion related reactions, if applicable   Changes made to treatment plan:  N/A  Follow up needed:  Pending authorization for treatment    Brandon Rubio, [place], 04/15/2022  1:14 PM

## 2022-04-15 NOTE — Progress Notes (Signed)
DISCONTINUE ON PATHWAY REGIMEN - Non-Small Cell Lung     A cycle is every 28 days:     Durvalumab   **Always confirm dose/schedule in your pharmacy ordering system**  REASON: Disease Progression PRIOR TREATMENT: [id number]: Durvalumab 1,500 mg q28 Days x up to 12 Months TREATMENT RESPONSE: Stable Disease (SD)  START ON PATHWAY REGIMEN - Non-Small Cell Lung     A cycle is every 21 days:     Pembrolizumab      Pemetrexed      Carboplatin   **Always confirm dose/schedule in your pharmacy ordering system**  Patient Characteristics: Stage IV Metastatic, Nonsquamous, Molecular Analysis Completed, Molecular Alteration Present and Targeted Therapy Exhausted OR EGFR Exon 20+ or KRAS G12C+ or HER2+ Present and No Prior Chemo/Immunotherapy OR No Alteration Present, Initial  Chemotherapy/Immunotherapy, PS = 0, 1, No Alteration Present, No Alteration Present, Candidate for Immunotherapy, PD-L1 Expression Positive 1-49% (TPS) / Negative / Not Tested / Awaiting Test Results and Immunotherapy Candidate Therapeutic Status: Stage IV Metastatic Histology: Nonsquamous Cell Broad Molecular Profiling Status: Molecular Analysis Completed Molecular Analysis Results: No Alteration Present ECOG Performance Status: 1 Chemotherapy/Immunotherapy Line of Therapy: Initial Chemotherapy/Immunotherapy EGFR Exons 18-21 Mutation Testing Status: Completed and Negative ALK Fusion/Rearrangement Testing Status: Completed and Negative BRAF V600 Mutation Testing Status: Completed and Negative KRAS G12C Mutation Testing Status: Completed and Negative MET Exon 14 Mutation Testing Status: Completed and Negative RET Fusion/Rearrangement Testing Status: Completed and Negative HER2 Mutation Testing Status: Completed and Negative NTRK Fusion/Rearrangement Testing Status: Completed and Negative ROS1 Fusion/Rearrangement Testing Status: Completed and Negative Immunotherapy Candidate Status: Candidate for Immunotherapy PD-L1  Expression Status: Did Not Order Test Intent of Therapy: Non-Curative / Palliative Intent, Discussed with Patient

## 2022-04-15 NOTE — Patient Instructions (Addendum)

## 2022-04-17 ENCOUNTER — Telehealth: Payer: Self-pay | Admitting: Internal Medicine

## 2022-04-17 NOTE — Telephone Encounter (Signed)
Scheduled per 02/06 los, patient has been called and notified.

## 2022-04-21 ENCOUNTER — Telehealth: Payer: Self-pay

## 2022-04-21 ENCOUNTER — Other Ambulatory Visit: Payer: Self-pay | Admitting: Physician Assistant

## 2022-04-21 MED FILL — Fosaprepitant Dimeglumine For IV Infusion 150 MG (Base Eq): INTRAVENOUS | Qty: 5 | Status: AC

## 2022-04-21 MED FILL — Dexamethasone Sodium Phosphate Inj 100 MG/10ML: INTRAMUSCULAR | Qty: 1 | Status: AC

## 2022-04-21 NOTE — Telephone Encounter (Signed)
Patient reached out with dental concerns and his treatment. He stated he has an appointment with the dentist tomorrow (04/22/2022) for a cracked tooth and filling and he thinks they may have to pull the tooth possibly. Per Cassandra H. PA, to delay treatment by a week. This LPN reached out to the scheduler Lacretia Nicks to reschedule the patient for next week. The patient knows he can contact the office if anything changes. No further concerns to note.

## 2022-04-22 ENCOUNTER — Inpatient Hospital Stay: Payer: Medicare Other

## 2022-04-23 NOTE — Progress Notes (Addendum)
Pharmacist Chemotherapy Monitoring - Initial Assessment    Anticipated start date: 04/30/22   The following has been reviewed per standard work regarding the patient's treatment regimen: The patient's diagnosis, treatment plan and drug doses, and organ/hematologic function Lab orders and baseline tests specific to treatment regimen  The treatment plan start date, drug sequencing, and pre-medications Prior authorization status  Patient's documented medication list, including drug-drug interaction screen and prescriptions for anti-emetics and supportive care specific to the treatment regimen The drug concentrations, fluid compatibility, administration routes, and timing of the medications to be used The patient's access for treatment and lifetime cumulative dose history, if applicable  The patient's medication allergies and previous infusion related reactions, if applicable   Changes made to treatment plan:  Added Apap and Ben to Cycle 4 premedications since it will be ~dose 7 for Carboplatin AUC 5-6.  Follow up needed:  N/A   Acquanetta Belling, [facility], BCPS, BCOP 04/23/2022  1:55 PM

## 2022-04-24 ENCOUNTER — Telehealth: Payer: Self-pay | Admitting: Internal Medicine

## 2022-04-24 DIAGNOSIS — C3491 Malignant neoplasm of unspecified part of right bronchus or lung: Secondary | ICD-10-CM

## 2022-04-24 MED ORDER — HYDROCODONE-ACETAMINOPHEN 5-325 MG PO TABS: 1.0000 | ORAL_TABLET | Freq: Four times a day (QID) | ORAL | 0 refills | Status: DC | PRN

## 2022-04-24 NOTE — Telephone Encounter (Signed)
Caller & Relationship to patient:  Self   Call back number:  [phone]   Date of last office visit:02/2022   Date of next office visit:   Medication(s) to be refilled:Hydrocodone        Preferred Pharmacy:  [facility] on [address] and [address]

## 2022-04-24 NOTE — Telephone Encounter (Signed)
Sent in

## 2022-04-25 ENCOUNTER — Telehealth: Payer: Self-pay | Admitting: Medical Oncology

## 2022-04-25 NOTE — Telephone Encounter (Signed)
Brandon Rubio needs dental extractions on 2 teeth for broken and painful teeth. Monday - He has an appt  for oral surgery consult only  ,with Dr Dione Housekeeper at [facility].  Tuesday-1st chemo /immunotherapy scheduled for Tuesday. Per Dr Julien Nordmann. If the procedure is next week then postpone infusion by 1 week. I told Art to call us after he sees Psychologist, sport and exercise .

## 2022-04-29 ENCOUNTER — Telehealth: Payer: Self-pay | Admitting: Medical Oncology

## 2022-04-29 MED FILL — Fosaprepitant Dimeglumine For IV Infusion 150 MG (Base Eq): INTRAVENOUS | Qty: 5 | Status: AC

## 2022-04-29 MED FILL — Dexamethasone Sodium Phosphate Inj 100 MG/10ML: INTRAMUSCULAR | Qty: 1 | Status: AC

## 2022-04-29 NOTE — Telephone Encounter (Signed)
Pt said to cancel his infusion tomorrow. He has an oral surgery appt @ 830 to extract  two teeth .

## 2022-04-30 ENCOUNTER — Inpatient Hospital Stay: Payer: Medicare Other

## 2022-04-30 ENCOUNTER — Other Ambulatory Visit: Payer: Self-pay | Admitting: Physician Assistant

## 2022-05-01 ENCOUNTER — Other Ambulatory Visit: Payer: Self-pay | Admitting: Physician Assistant

## 2022-05-02 ENCOUNTER — Telehealth: Payer: Self-pay | Admitting: *Deleted

## 2022-05-02 NOTE — Telephone Encounter (Signed)
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  Per C. Heilingoetter PA, unless patient has some type of complication with his tooth extraction such as infection, etc., he is to keep his scheduled infusion on March 4.  He may have his eye exam at any time - if he needs cataracts removed, this needs to be done on his off week from infusion.  She wishes to further discuss his basal cell situation at his appointment with her on March 25.  Patient verbalizes understanding.

## 2022-05-09 ENCOUNTER — Telehealth: Payer: Self-pay | Admitting: Medical Oncology

## 2022-05-09 MED FILL — Dexamethasone Sodium Phosphate Inj 100 MG/10ML: INTRAMUSCULAR | Qty: 1 | Status: AC

## 2022-05-09 MED FILL — Fosaprepitant Dimeglumine For IV Infusion 150 MG (Base Eq): INTRAVENOUS | Qty: 5 | Status: AC

## 2022-05-09 NOTE — Telephone Encounter (Signed)
Appts confirmed with pt for lab on 02/4 and infusion on 02/05.

## 2022-05-12 ENCOUNTER — Inpatient Hospital Stay: Payer: Medicare Other | Attending: Internal Medicine

## 2022-05-12 ENCOUNTER — Ambulatory Visit: Payer: Medicare Other | Admitting: Internal Medicine

## 2022-05-12 ENCOUNTER — Other Ambulatory Visit: Payer: Medicare Other

## 2022-05-12 ENCOUNTER — Other Ambulatory Visit: Payer: Self-pay

## 2022-05-12 ENCOUNTER — Inpatient Hospital Stay: Payer: Medicare Other

## 2022-05-12 DIAGNOSIS — Z79899 Other long term (current) drug therapy: Secondary | ICD-10-CM | POA: Insufficient documentation

## 2022-05-12 DIAGNOSIS — C3431 Malignant neoplasm of lower lobe, right bronchus or lung: Secondary | ICD-10-CM | POA: Insufficient documentation

## 2022-05-12 DIAGNOSIS — Z5111 Encounter for antineoplastic chemotherapy: Secondary | ICD-10-CM | POA: Insufficient documentation

## 2022-05-12 DIAGNOSIS — H40013 Open angle with borderline findings, low risk, bilateral: Secondary | ICD-10-CM | POA: Diagnosis not present

## 2022-05-12 DIAGNOSIS — C3491 Malignant neoplasm of unspecified part of right bronchus or lung: Secondary | ICD-10-CM

## 2022-05-12 DIAGNOSIS — E119 Type 2 diabetes mellitus without complications: Secondary | ICD-10-CM | POA: Diagnosis not present

## 2022-05-12 DIAGNOSIS — H2513 Age-related nuclear cataract, bilateral: Secondary | ICD-10-CM | POA: Diagnosis not present

## 2022-05-12 DIAGNOSIS — H524 Presbyopia: Secondary | ICD-10-CM | POA: Diagnosis not present

## 2022-05-12 DIAGNOSIS — H353132 Nonexudative age-related macular degeneration, bilateral, intermediate dry stage: Secondary | ICD-10-CM | POA: Diagnosis not present

## 2022-05-12 LAB — CBC WITH DIFFERENTIAL (CANCER CENTER ONLY)
Abs Immature Granulocytes: 0.03 10*3/uL (ref 0.00–0.07)
Basophils Absolute: 0.1 10*3/uL (ref 0.0–0.1)
Basophils Relative: 1 %
Eosinophils Absolute: 0.2 10*3/uL (ref 0.0–0.5)
Eosinophils Relative: 2 %
HCT: 38.2 % — ABNORMAL LOW (ref 39.0–52.0)
Hemoglobin: 13.6 g/dL (ref 13.0–17.0)
Immature Granulocytes: 0 %
Lymphocytes Relative: 9 %
Lymphs Abs: 0.9 10*3/uL (ref 0.7–4.0)
MCH: 30.4 pg (ref 26.0–34.0)
MCHC: 35.6 g/dL (ref 30.0–36.0)
MCV: 85.5 fL (ref 80.0–100.0)
Monocytes Absolute: 0.6 10*3/uL (ref 0.1–1.0)
Monocytes Relative: 6 %
Neutro Abs: 8.1 10*3/uL — ABNORMAL HIGH (ref 1.7–7.7)
Neutrophils Relative %: 82 %
Platelet Count: 197 10*3/uL (ref 150–400)
RBC: 4.47 MIL/uL (ref 4.22–5.81)
RDW: 12.3 % (ref 11.5–15.5)
WBC Count: 9.9 10*3/uL (ref 4.0–10.5)
nRBC: 0 % (ref 0.0–0.2)

## 2022-05-12 LAB — CMP (CANCER CENTER ONLY)
ALT: 18 U/L (ref 0–44)
AST: 18 U/L (ref 15–41)
Albumin: 4.2 g/dL (ref 3.5–5.0)
Alkaline Phosphatase: 55 U/L (ref 38–126)
Anion gap: 8 (ref 5–15)
BUN: 18 mg/dL (ref 8–23)
CO2: 26 mmol/L (ref 22–32)
Calcium: 9.2 mg/dL (ref 8.9–10.3)
Chloride: 103 mmol/L (ref 98–111)
Creatinine: 0.85 mg/dL (ref 0.61–1.24)
GFR, Estimated: >60 mL/min (ref >60)
Glucose, Bld: 148 mg/dL — ABNORMAL HIGH (ref 70–99)
Potassium: 4 mmol/L (ref 3.5–5.1)
Sodium: 137 mmol/L (ref 135–145)
Total Bilirubin: 0.5 mg/dL (ref 0.3–1.2)
Total Protein: 7 g/dL (ref 6.5–8.1)

## 2022-05-12 LAB — HM DIABETES EYE EXAM

## 2022-05-12 LAB — TSH: TSH: 1.058 u[IU]/mL (ref 0.350–4.500)

## 2022-05-13 ENCOUNTER — Ambulatory Visit: Payer: Medicare Other | Admitting: Internal Medicine

## 2022-05-13 ENCOUNTER — Ambulatory Visit: Payer: Medicare Other

## 2022-05-13 ENCOUNTER — Inpatient Hospital Stay: Payer: Medicare Other

## 2022-05-13 ENCOUNTER — Other Ambulatory Visit: Payer: Medicare Other

## 2022-05-13 VITALS — BP 123/68 | HR 89 | Temp 98.1°F | Resp 18 | Wt 196.2 lb

## 2022-05-13 DIAGNOSIS — Z79899 Other long term (current) drug therapy: Secondary | ICD-10-CM | POA: Diagnosis not present

## 2022-05-13 DIAGNOSIS — Z5111 Encounter for antineoplastic chemotherapy: Secondary | ICD-10-CM | POA: Diagnosis not present

## 2022-05-13 DIAGNOSIS — C3431 Malignant neoplasm of lower lobe, right bronchus or lung: Secondary | ICD-10-CM | POA: Diagnosis not present

## 2022-05-13 DIAGNOSIS — C3491 Malignant neoplasm of unspecified part of right bronchus or lung: Secondary | ICD-10-CM

## 2022-05-13 LAB — T4: T4, Total: 9.6 ug/dL (ref 4.5–12.0)

## 2022-05-13 MED ORDER — FAMOTIDINE IN NACL 20-0.9 MG/50ML-% IV SOLN
20.0000 mg | Freq: Once | INTRAVENOUS | Status: DC
  Filled 2022-05-13: qty 50

## 2022-05-13 MED ORDER — SODIUM CHLORIDE 0.9 % IV SOLN
150.0000 mg | Freq: Once | INTRAVENOUS | Status: DC
  Filled 2022-05-13 (×3): qty 5

## 2022-05-13 MED ORDER — PALONOSETRON HCL INJECTION 0.25 MG/5ML
0.2500 mg | Freq: Once | INTRAVENOUS | Status: AC
  Administered 2022-05-13: 0.25 mg via INTRAVENOUS
  Filled 2022-05-13: qty 5

## 2022-05-13 MED ORDER — SODIUM CHLORIDE 0.9 % IV SOLN
500.0000 mg/m2 | Freq: Once | INTRAVENOUS | Status: AC
  Administered 2022-05-13: 1100 mg via INTRAVENOUS
  Filled 2022-05-13: qty 40

## 2022-05-13 MED ORDER — SODIUM CHLORIDE 0.9 % IV SOLN
10.0000 mg | Freq: Once | INTRAVENOUS | Status: DC
  Filled 2022-05-13 (×3): qty 1

## 2022-05-13 MED ORDER — SODIUM CHLORIDE 0.9 % IV SOLN
533.0000 mg | Freq: Once | INTRAVENOUS | Status: AC
  Administered 2022-05-13: 550 mg via INTRAVENOUS
  Filled 2022-05-13: qty 55

## 2022-05-13 MED ORDER — SODIUM CHLORIDE 0.9 % IV SOLN: Freq: Once | INTRAVENOUS | Status: AC

## 2022-05-13 MED ORDER — SODIUM CHLORIDE 0.9 % IV SOLN
200.0000 mg | Freq: Once | INTRAVENOUS | Status: AC
  Administered 2022-05-13: 200 mg via INTRAVENOUS
  Filled 2022-05-13: qty 8

## 2022-05-13 MED ORDER — SODIUM CHLORIDE 0.9 % IV SOLN
150.0000 mg | Freq: Once | INTRAVENOUS | Status: AC
  Administered 2022-05-13: 150 mg via INTRAVENOUS
  Filled 2022-05-13: qty 150

## 2022-05-13 MED ORDER — DIPHENHYDRAMINE HCL 25 MG PO CAPS
25.0000 mg | ORAL_CAPSULE | Freq: Once | ORAL | Status: DC
  Filled 2022-05-13: qty 1

## 2022-05-13 MED ORDER — SODIUM CHLORIDE 0.9 % IV SOLN
10.0000 mg | Freq: Once | INTRAVENOUS | Status: AC
  Administered 2022-05-13: 10 mg via INTRAVENOUS
  Filled 2022-05-13: qty 10

## 2022-05-13 NOTE — Patient Instructions (Signed)
[facility]  Discharge Instructions: Thank you for choosing [facility] to provide your oncology and hematology care.   If you have a lab appointment with the [facility], please go directly to the [facility] and check in at the registration area.   Wear comfortable clothing and clothing appropriate for easy access to any Portacath or PICC line.   We strive to give you quality time with your provider. You may need to reschedule your appointment if you arrive late (15 or more minutes).  Arriving late affects you and other patients whose appointments are after yours.  Also, if you miss three or more appointments without notifying the office, you may be dismissed from the clinic at the provider's discretion.      For prescription refill requests, have your pharmacy contact our office and allow 72 hours for refills to be completed.    Today you received the following chemotherapy and/or immunotherapy agents Keytruda, Alimta & Carboplatin      To help prevent nausea and vomiting after your treatment, we encourage you to take your nausea medication as directed.  BELOW ARE SYMPTOMS THAT SHOULD BE REPORTED IMMEDIATELY: *FEVER GREATER THAN 100.4 F (38 C) OR HIGHER *CHILLS OR SWEATING *NAUSEA AND VOMITING THAT IS NOT CONTROLLED WITH YOUR NAUSEA MEDICATION *UNUSUAL SHORTNESS OF BREATH *UNUSUAL BRUISING OR BLEEDING *URINARY PROBLEMS (pain or burning when urinating, or frequent urination) *BOWEL PROBLEMS (unusual diarrhea, constipation, pain near the anus) TENDERNESS IN MOUTH AND THROAT WITH OR WITHOUT PRESENCE OF ULCERS (sore throat, sores in mouth, or a toothache) UNUSUAL RASH, SWELLING OR PAIN  UNUSUAL VAGINAL DISCHARGE OR ITCHING   Items with * indicate a potential emergency and should be followed up as soon as possible or go to the Emergency Department if any problems should occur.  Please show the CHEMOTHERAPY ALERT CARD or IMMUNOTHERAPY  ALERT CARD at check-in to the Emergency Department and triage nurse.  Should you have questions after your visit or need to cancel or reschedule your appointment, please contact [facility]  Dept: [phone]  and follow the prompts.  Office hours are 8:00 a.m. to 4:30 p.m. Monday - Friday. Please note that voicemails left after 4:00 p.m. may not be returned until the following business day.  We are closed weekends and major holidays. You have access to a nurse at all times for urgent questions. Please call the main number to the clinic Dept: [phone] and follow the prompts.   For any non-urgent questions, you may also contact your provider using MyChart. We now offer e-Visits for anyone 36 and older to request care online for non-urgent symptoms. For details visit [email or web address].   Also download the MyChart app! Go to the app store, search "MyChart", open the app, select [facility], and log in with your MyChart username and password.  Pembrolizumab Injection What is this medication? PEMBROLIZUMAB (PEM broe LIZ ue mab) treats some types of cancer. It works by helping your immune system slow or stop the spread of cancer cells. It is a monoclonal antibody. This medicine may be used for other purposes; ask your health care provider or pharmacist if you have questions. COMMON BRAND NAME(S): Keytruda What should I tell my care team before I take this medication? They need to know if you have any of these conditions: Allogeneic stem cell transplant (uses someone else's stem cells) Autoimmune diseases, such as Crohn disease, ulcerative colitis, lupus History of  chest radiation Nervous system problems, such as Guillain-Barre syndrome, myasthenia gravis Organ transplant An unusual or allergic reaction to pembrolizumab, other medications, foods, dyes, or preservatives Pregnant or trying to get pregnant Breast-feeding How should I use this  medication? This medication is injected into a vein. It is given by your care team in a hospital or clinic setting. A special MedGuide will be given to you before each treatment. Be sure to read this information carefully each time. Talk to your care team about the use of this medication in children. While it may be prescribed for children as young as 6 months for selected conditions, precautions do apply. Overdosage: If you think you have taken too much of this medicine contact a poison control center or emergency room at once. NOTE: This medicine is only for you. Do not share this medicine with others. What if I miss a dose? Keep appointments for follow-up doses. It is important not to miss your dose. Call your care team if you are unable to keep an appointment. What may interact with this medication? Interactions have not been studied. This list may not describe all possible interactions. Give your health care provider a list of all the medicines, herbs, non-prescription drugs, or dietary supplements you use. Also tell them if you smoke, drink alcohol, or use illegal drugs. Some items may interact with your medicine. What should I watch for while using this medication? Your condition will be monitored carefully while you are receiving this medication. You may need blood work while taking this medication. This medication may cause serious skin reactions. They can happen weeks to months after starting the medication. Contact your care team right away if you notice fevers or flu-like symptoms with a rash. The rash may be red or purple and then turn into blisters or peeling of the skin. You may also notice a red rash with swelling of the face, lips, or lymph nodes in your neck or under your arms. Tell your care team right away if you have any change in your eyesight. Talk to your care team if you may be pregnant. Serious birth defects can occur if you take this medication during pregnancy and for 4  months after the last dose. You will need a negative pregnancy test before starting this medication. Contraception is recommended while taking this medication and for 4 months after the last dose. Your care team can help you find the option that works for you. Do not breastfeed while taking this medication and for 4 months after the last dose. What side effects may I notice from receiving this medication? Side effects that you should report to your care team as soon as possible: Allergic reactions--skin rash, itching, hives, swelling of the face, lips, tongue, or throat Dry cough, shortness of breath or trouble breathing Eye pain, redness, irritation, or discharge with blurry or decreased vision Heart muscle inflammation--unusual weakness or fatigue, shortness of breath, chest pain, fast or irregular heartbeat, dizziness, swelling of the ankles, feet, or hands Hormone gland problems--headache, sensitivity to light, unusual weakness or fatigue, dizziness, fast or irregular heartbeat, increased sensitivity to cold or heat, excessive sweating, constipation, hair loss, increased thirst or amount of urine, tremors or shaking, irritability Infusion reactions--chest pain, shortness of breath or trouble breathing, feeling faint or lightheaded Kidney injury (glomerulonephritis)--decrease in the amount of urine, red or dark brown urine, foamy or bubbly urine, swelling of the ankles, hands, or feet Liver injury--right upper belly pain, loss of appetite, nausea, light-colored stool,  dark yellow or brown urine, yellowing skin or eyes, unusual weakness or fatigue Pain, tingling, or numbness in the hands or feet, muscle weakness, change in vision, confusion or trouble speaking, loss of balance or coordination, trouble walking, seizures Rash, fever, and swollen lymph nodes Redness, blistering, peeling, or loosening of the skin, including inside the mouth Sudden or severe stomach pain, bloody diarrhea, fever, nausea,  vomiting Side effects that usually do not require medical attention (report to your care team if they continue or are bothersome): Bone, joint, or muscle pain Diarrhea Fatigue Loss of appetite Nausea Skin rash This list may not describe all possible side effects. Call your doctor for medical advice about side effects. You may report side effects to FDA at 1-800-FDA-1088. Where should I keep my medication? This medication is given in a hospital or clinic. It will not be stored at home. NOTE: This sheet is a summary. It may not cover all possible information. If you have questions about this medicine, talk to your doctor, pharmacist, or health care provider.  2023 Elsevier/Gold Standard (2021-07-09 00:00:00)  Pemetrexed Injection What is this medication? PEMETREXED (PEM e TREX ed) treats some types of cancer. It works by slowing down the growth of cancer cells. This medicine may be used for other purposes; ask your health care provider or pharmacist if you have questions. COMMON BRAND NAME(S): Alimta, PEMFEXY What should I tell my care team before I take this medication? They need to know if you have any of these conditions: Infection, such as chickenpox, cold sores, or herpes Kidney disease Low blood cell levels (white cells, red cells, and platelets) Lung or breathing disease, such as asthma Radiation therapy An unusual or allergic reaction to pemetrexed, other medications, foods, dyes, or preservatives If you or your partner are pregnant or trying to get pregnant Breast-feeding How should I use this medication? This medication is injected into a vein. It is given by your care team in a hospital or clinic setting. Talk to your care team about the use of this medication in children. Special care may be needed. Overdosage: If you think you have taken too much of this medicine contact a poison control center or emergency room at once. NOTE: This medicine is only for you. Do not share  this medicine with others. What if I miss a dose? Keep appointments for follow-up doses. It is important not to miss your dose. Call your care team if you are unable to keep an appointment. What may interact with this medication? Do not take this medication with any of the following: Live virus vaccines This medication may also interact with the following: Ibuprofen This list may not describe all possible interactions. Give your health care provider a list of all the medicines, herbs, non-prescription drugs, or dietary supplements you use. Also tell them if you smoke, drink alcohol, or use illegal drugs. Some items may interact with your medicine. What should I watch for while using this medication? Your condition will be monitored carefully while you are receiving this medication. This medication may make you feel generally unwell. This is not uncommon as chemotherapy can affect healthy cells as well as cancer cells. Report any side effects. Continue your course of treatment even though you feel ill unless your care team tells you to stop. This medication can cause serious side effects. To reduce the risk, your care team may give you other medications to take before receiving this one. Be sure to follow the directions from your care  team. This medication can cause a rash or redness in areas of the body that have previously had radiation therapy. If you have had radiation therapy, tell your care team if you notice a rash in this area. This medication may increase your risk of getting an infection. Call your care team for advice if you get a fever, chills, sore throat, or other symptoms of a cold or flu. Do not treat yourself. Try to avoid being around people who are sick. Be careful brushing or flossing your teeth or using a toothpick because you may get an infection or bleed more easily. If you have any dental work done, tell your dentist you are receiving this medication. Avoid taking medications  that contain aspirin, acetaminophen, ibuprofen, naproxen, or ketoprofen unless instructed by your care team. These medications may hide a fever. Check with your care team if you have severe diarrhea, nausea, and vomiting, or if you sweat a lot. The loss of too much body fluid may make it dangerous for you to take this medication. Talk to your care team if you or your partner wish to become pregnant or think either of you might be pregnant. This medication can cause serious birth defects if taken during pregnancy and for 6 months after the last dose. A negative pregnancy test is required before starting this medication. A reliable form of contraception is recommended while taking this medication and for 6 months after the last dose. Talk to your care team about reliable forms of contraception. Do not father a child while taking this medication and for 3 months after the last dose. Use a condom while having sex during this time period. Do not breastfeed while taking this medication and for 1 week after the last dose. This medication may cause infertility. Talk to your care team if you are concerned about your fertility. What side effects may I notice from receiving this medication? Side effects that you should report to your care team as soon as possible: Allergic reactions--skin rash, itching, hives, swelling of the face, lips, tongue, or throat Dry cough, shortness of breath or trouble breathing Infection--fever, chills, cough, sore throat, wounds that don't heal, pain or trouble when passing urine, general feeling of discomfort or being unwell Kidney injury--decrease in the amount of urine, swelling of the ankles, hands, or feet Low red blood cell level--unusual weakness or fatigue, dizziness, headache, trouble breathing Redness, blistering, peeling, or loosening of the skin, including inside the mouth Unusual bruising or bleeding Side effects that usually do not require medical attention (report to  your care team if they continue or are bothersome): Fatigue Loss of appetite Nausea Vomiting This list may not describe all possible side effects. Call your doctor for medical advice about side effects. You may report side effects to FDA at 1-800-FDA-1088. Where should I keep my medication? This medication is given in a hospital or clinic. It will not be stored at home. NOTE: This sheet is a summary. It may not cover all possible information. If you have questions about this medicine, talk to your doctor, pharmacist, or health care provider.  2023 Elsevier/Gold Standard (2021-07-01 00:00:00)  Carboplatin Injection What is this medication? CARBOPLATIN (KAR boe pla tin) treats some types of cancer. It works by slowing down the growth of cancer cells. This medicine may be used for other purposes; ask your health care provider or pharmacist if you have questions. COMMON BRAND NAME(S): Paraplatin What should I tell my care team before I take this medication?  They need to know if you have any of these conditions: Blood disorders Hearing problems Kidney disease Recent or ongoing radiation therapy An unusual or allergic reaction to carboplatin, cisplatin, other medications, foods, dyes, or preservatives Pregnant or trying to get pregnant Breast-feeding How should I use this medication? This medication is injected into a vein. It is given by your care team in a hospital or clinic setting. Talk to your care team about the use of this medication in children. Special care may be needed. Overdosage: If you think you have taken too much of this medicine contact a poison control center or emergency room at once. NOTE: This medicine is only for you. Do not share this medicine with others. What if I miss a dose? Keep appointments for follow-up doses. It is important not to miss your dose. Call your care team if you are unable to keep an appointment. What may interact with this  medication? Medications for seizures Some antibiotics, such as amikacin, gentamicin, neomycin, streptomycin, tobramycin Vaccines This list may not describe all possible interactions. Give your health care provider a list of all the medicines, herbs, non-prescription drugs, or dietary supplements you use. Also tell them if you smoke, drink alcohol, or use illegal drugs. Some items may interact with your medicine. What should I watch for while using this medication? Your condition will be monitored carefully while you are receiving this medication. You may need blood work while taking this medication. This medication may make you feel generally unwell. This is not uncommon, as chemotherapy can affect healthy cells as well as cancer cells. Report any side effects. Continue your course of treatment even though you feel ill unless your care team tells you to stop. In some cases, you may be given additional medications to help with side effects. Follow all directions for their use. This medication may increase your risk of getting an infection. Call your care team for advice if you get a fever, chills, sore throat, or other symptoms of a cold or flu. Do not treat yourself. Try to avoid being around people who are sick. Avoid taking medications that contain aspirin, acetaminophen, ibuprofen, naproxen, or ketoprofen unless instructed by your care team. These medications may hide a fever. Be careful brushing or flossing your teeth or using a toothpick because you may get an infection or bleed more easily. If you have any dental work done, tell your dentist you are receiving this medication. Talk to your care team if you wish to become pregnant or think you might be pregnant. This medication can cause serious birth defects. Talk to your care team about effective forms of contraception. Do not breast-feed while taking this medication. What side effects may I notice from receiving this medication? Side effects  that you should report to your care team as soon as possible: Allergic reactions--skin rash, itching, hives, swelling of the face, lips, tongue, or throat Infection--fever, chills, cough, sore throat, wounds that don't heal, pain or trouble when passing urine, general feeling of discomfort or being unwell Low red blood cell level--unusual weakness or fatigue, dizziness, headache, trouble breathing Pain, tingling, or numbness in the hands or feet, muscle weakness, change in vision, confusion or trouble speaking, loss of balance or coordination, trouble walking, seizures Unusual bruising or bleeding Side effects that usually do not require medical attention (report to your care team if they continue or are bothersome): Hair loss Nausea Unusual weakness or fatigue Vomiting This list may not describe all possible side effects. Call  your doctor for medical advice about side effects. You may report side effects to FDA at 1-800-FDA-1088. Where should I keep my medication? This medication is given in a hospital or clinic. It will not be stored at home. NOTE: This sheet is a summary. It may not cover all possible information. If you have questions about this medicine, talk to your doctor, pharmacist, or health care provider.  2023 Elsevier/Gold Standard (2007-04-17 00:00:00)

## 2022-05-14 ENCOUNTER — Telehealth: Payer: Self-pay

## 2022-05-14 NOTE — Telephone Encounter (Signed)
-----   Message from Rolene Course, RN sent at 05/13/2022 11:06 AM EST ----- Regarding: Brandon Rubio 1st Tx F/U call - Keytruda/Alimta/Carbo Mohamed 1st Tx F/U call - Keytruda/Alimta/Carbo.  Tolerated infusion well.

## 2022-05-14 NOTE — Telephone Encounter (Signed)
This nurse spoke with patient about his first Keytruda/Alimta/Carbo treatment.  Patient states he tolerated well he states the only thing he noticed is he was very sleepy.  He has not had any other side effects as of yet.  Patient was made aware to give [place] a call if he develops any side effects or symptoms.  Patient acknowledged understanding.  No further questions or concerns at this time.

## 2022-05-19 ENCOUNTER — Inpatient Hospital Stay: Payer: Medicare Other

## 2022-05-20 ENCOUNTER — Ambulatory Visit: Payer: Medicare Other

## 2022-05-20 ENCOUNTER — Other Ambulatory Visit: Payer: Medicare Other

## 2022-05-20 ENCOUNTER — Ambulatory Visit: Payer: Medicare Other | Admitting: Internal Medicine

## 2022-05-21 ENCOUNTER — Inpatient Hospital Stay: Payer: Medicare Other

## 2022-05-21 ENCOUNTER — Other Ambulatory Visit: Payer: Self-pay

## 2022-05-21 DIAGNOSIS — Z79899 Other long term (current) drug therapy: Secondary | ICD-10-CM | POA: Diagnosis not present

## 2022-05-21 DIAGNOSIS — C3490 Malignant neoplasm of unspecified part of unspecified bronchus or lung: Secondary | ICD-10-CM

## 2022-05-21 DIAGNOSIS — Z5111 Encounter for antineoplastic chemotherapy: Secondary | ICD-10-CM | POA: Diagnosis not present

## 2022-05-21 DIAGNOSIS — C3431 Malignant neoplasm of lower lobe, right bronchus or lung: Secondary | ICD-10-CM | POA: Diagnosis not present

## 2022-05-21 LAB — CBC WITH DIFFERENTIAL (CANCER CENTER ONLY)
Abs Immature Granulocytes: 0 10*3/uL (ref 0.00–0.07)
Basophils Absolute: 0 10*3/uL (ref 0.0–0.1)
Basophils Relative: 1 %
Eosinophils Absolute: 0.1 10*3/uL (ref 0.0–0.5)
Eosinophils Relative: 4 %
HCT: 36.4 % — ABNORMAL LOW (ref 39.0–52.0)
Hemoglobin: 12.7 g/dL — ABNORMAL LOW (ref 13.0–17.0)
Immature Granulocytes: 0 %
Lymphocytes Relative: 47 %
Lymphs Abs: 1.8 10*3/uL (ref 0.7–4.0)
MCH: 30.2 pg (ref 26.0–34.0)
MCHC: 34.9 g/dL (ref 30.0–36.0)
MCV: 86.5 fL (ref 80.0–100.0)
Monocytes Absolute: 0.4 10*3/uL (ref 0.1–1.0)
Monocytes Relative: 9 %
Neutro Abs: 1.6 10*3/uL — ABNORMAL LOW (ref 1.7–7.7)
Neutrophils Relative %: 39 %
Platelet Count: 125 10*3/uL — ABNORMAL LOW (ref 150–400)
RBC: 4.21 MIL/uL — ABNORMAL LOW (ref 4.22–5.81)
RDW: 11.9 % (ref 11.5–15.5)
Smear Review: NORMAL
WBC Count: 3.9 10*3/uL — ABNORMAL LOW (ref 4.0–10.5)
nRBC: 0 % (ref 0.0–0.2)

## 2022-05-21 LAB — CMP (CANCER CENTER ONLY)
ALT: 25 U/L (ref 0–44)
AST: 20 U/L (ref 15–41)
Albumin: 4.1 g/dL (ref 3.5–5.0)
Alkaline Phosphatase: 58 U/L (ref 38–126)
Anion gap: 9 (ref 5–15)
BUN: 15 mg/dL (ref 8–23)
CO2: 28 mmol/L (ref 22–32)
Calcium: 8.7 mg/dL — ABNORMAL LOW (ref 8.9–10.3)
Chloride: 102 mmol/L (ref 98–111)
Creatinine: 0.93 mg/dL (ref 0.61–1.24)
GFR, Estimated: >60 mL/min (ref >60)
Glucose, Bld: 138 mg/dL — ABNORMAL HIGH (ref 70–99)
Potassium: 3.9 mmol/L (ref 3.5–5.1)
Sodium: 139 mmol/L (ref 135–145)
Total Bilirubin: 0.4 mg/dL (ref 0.3–1.2)
Total Protein: 7.1 g/dL (ref 6.5–8.1)

## 2022-05-26 ENCOUNTER — Inpatient Hospital Stay: Payer: Medicare Other

## 2022-05-26 DIAGNOSIS — C3431 Malignant neoplasm of lower lobe, right bronchus or lung: Secondary | ICD-10-CM | POA: Diagnosis not present

## 2022-05-26 DIAGNOSIS — C3491 Malignant neoplasm of unspecified part of right bronchus or lung: Secondary | ICD-10-CM

## 2022-05-26 DIAGNOSIS — R5382 Chronic fatigue, unspecified: Secondary | ICD-10-CM

## 2022-05-26 DIAGNOSIS — Z5111 Encounter for antineoplastic chemotherapy: Secondary | ICD-10-CM | POA: Diagnosis not present

## 2022-05-26 DIAGNOSIS — Z79899 Other long term (current) drug therapy: Secondary | ICD-10-CM | POA: Diagnosis not present

## 2022-05-26 LAB — CBC WITH DIFFERENTIAL (CANCER CENTER ONLY)
Abs Immature Granulocytes: 0.01 10*3/uL (ref 0.00–0.07)
Basophils Absolute: 0 10*3/uL (ref 0.0–0.1)
Basophils Relative: 0 %
Eosinophils Absolute: 0.1 10*3/uL (ref 0.0–0.5)
Eosinophils Relative: 1 %
HCT: 35 % — ABNORMAL LOW (ref 39.0–52.0)
Hemoglobin: 12.4 g/dL — ABNORMAL LOW (ref 13.0–17.0)
Immature Granulocytes: 0 %
Lymphocytes Relative: 17 %
Lymphs Abs: 0.8 10*3/uL (ref 0.7–4.0)
MCH: 30.7 pg (ref 26.0–34.0)
MCHC: 35.4 g/dL (ref 30.0–36.0)
MCV: 86.6 fL (ref 80.0–100.0)
Monocytes Absolute: 0.4 10*3/uL (ref 0.1–1.0)
Monocytes Relative: 9 %
Neutro Abs: 3.2 10*3/uL (ref 1.7–7.7)
Neutrophils Relative %: 73 %
Platelet Count: 115 10*3/uL — ABNORMAL LOW (ref 150–400)
RBC: 4.04 MIL/uL — ABNORMAL LOW (ref 4.22–5.81)
RDW: 11.9 % (ref 11.5–15.5)
WBC Count: 4.4 10*3/uL (ref 4.0–10.5)
nRBC: 0 % (ref 0.0–0.2)

## 2022-05-26 LAB — CMP (CANCER CENTER ONLY)
ALT: 27 U/L (ref 0–44)
AST: 21 U/L (ref 15–41)
Albumin: 4.1 g/dL (ref 3.5–5.0)
Alkaline Phosphatase: 58 U/L (ref 38–126)
Anion gap: 11 (ref 5–15)
BUN: 12 mg/dL (ref 8–23)
CO2: 24 mmol/L (ref 22–32)
Calcium: 9.2 mg/dL (ref 8.9–10.3)
Chloride: 102 mmol/L (ref 98–111)
Creatinine: 0.88 mg/dL (ref 0.61–1.24)
GFR, Estimated: >60 mL/min (ref >60)
Glucose, Bld: 195 mg/dL — ABNORMAL HIGH (ref 70–99)
Potassium: 3.9 mmol/L (ref 3.5–5.1)
Sodium: 137 mmol/L (ref 135–145)
Total Bilirubin: 0.3 mg/dL (ref 0.3–1.2)
Total Protein: 7.1 g/dL (ref 6.5–8.1)

## 2022-05-26 LAB — TSH: TSH: 1.002 u[IU]/mL (ref 0.350–4.500)

## 2022-05-29 NOTE — Progress Notes (Deleted)
Brandon Rubio OFFICE PROGRESS NOTE  Brandon Koch, MD [place] [address]  DIAGNOSIS: Recurrent/metastatic Non-Small Cell Lung Cancer, adenocarcinoma. He initially presented as  Stage IIIA (T4, N0, M0) non-small cell lung cancer, favoring adenocarcinoma presented with large right lower lobe lung mass with no mediastinal lymphadenopathy or extrathoracic metastasis diagnosed in October 2022. Had disease progression with enlarging mass and new right hilar and mediastinal lymph nodes in December 2022. He was found to have bilateral pulmonary nodules indicative of metastatic disease in January 2024   Foundation One: No actionable mutations  PRIOR THERAPY: 1) Neoadjuvant treatment with carboplatin for AUC of 5, Alimta 500 Mg/M2 and nivolumab 360 Mg IV every 3 weeks.  First dose January 23, 2021.  Status post 3 cycles. 2) Concurrent chemoradiation with carboplatin for an AUC of 2 and paclitaxel 45 mg per metered squared.  First dose expected on 04/01/2021.  Status post 4 cycles 3) Consolidation treatment with immunotherapy with Imfinzi 1500 Mg IV every 4 weeks.  First dose on Jul 08, 2021.  Status post 9 cycles.   CURRENT THERAPY: Palliative systemic chemotherapy with carboplatin for an AUC of 5 Alimta 500 mg/m, Keytruda 200 mg IV every 3 weeks. First dose expected on 05/13/2022.   INTERVAL HISTORY: Brandon Rubio. 72 y.o. male returns  to the clinic today for a follow-up visit.  The patient was last seen by myself and Dr. Julien Rubio on 04/15/2022. Marland Kitchen At that point in time, the patient had a restaging imaging studies showing disease progression with enlarging bilateral pulmonary nodules. Therefore, he started systemic chemotherapy and immunotherapy on 05/13/22. He tolerated it well except for fatigue. He had a tooth extraction since last being seen. He denies any new fevers, chills, night sweats, or unexplained weight loss.   He reports his baseline cough which  produces clear sputum in the AM.  He denies any chest pain or hemoptysis.  He reports stable dyspnea on exertion. He states overall his breathing is the same. Denies any nausea, vomiting, diarrhea, or constipation.  Denies any headache or visual changes.  Denies any rashes or skin changes.  He is here today for evaluation and to review his labs before starting cycle #2.      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  MEDICATIONS:  Current Outpatient Medications  Medication Sig Dispense Refill   aspirin EC 81 MG tablet Take 81 mg by mouth in the morning. Swallow whole.     bisacodyl (DULCOLAX) 5 MG EC tablet Take 5 mg by mouth daily as needed for moderate constipation.     folic acid (FOLVITE) 1 MG tablet Take 1 tablet (1 mg total) by mouth daily. 30 tablet 2   HYDROcodone-acetaminophen (NORCO) 5-325 MG tablet Take 1 tablet by mouth every 6 (six) hours as needed for moderate pain. 60 tablet 0   metFORMIN (GLUCOPHAGE) 1000 MG tablet TAKE 1 TABLET(1000 MG) BY MOUTH TWICE DAILY WITH A MEAL 180  tablet 1   Multiple Vitamins-Minerals (CENTRUM SILVER 50+MEN) TABS Take 1 tablet by mouth daily with breakfast.     omeprazole (PRILOSEC) 20 MG capsule TAKE 1 CAPSULE BY MOUTH TWICE DAILY BEFORE A MEAL 180 capsule 1   predniSONE (DELTASONE) 5 MG tablet Take 1.5 tablets (7.5 mg total) by mouth daily with breakfast. 135 tablet 2   prochlorperazine (COMPAZINE) 10 MG tablet TAKE 1 TABLET(10 MG) BY MOUTH EVERY 6 HOURS AS NEEDED FOR NAUSEA OR VOMITING (Patient not taking:  Reported on 04/15/2022) 30 tablet 0   simvastatin (ZOCOR) 40 MG tablet TAKE 1 TABLET(40 MG) BY MOUTH DAILY AT 6 PM 90 tablet 3   No current facility-administered medications for this visit.    SURGICAL HISTORY:  Past Surgical History:  Procedure Laterality Date   BRONCHIAL BIOPSY  12/10/2020   Procedure: BRONCHIAL BIOPSIES;  Surgeon: Brandon Gobble, MD;  Location: [facility] ENDOSCOPY;  Service: Pulmonary;;   BRONCHIAL BRUSHINGS  12/10/2020   Procedure: BRONCHIAL BRUSHINGS;  Surgeon: Brandon Gobble, MD;  Location: [facility] ENDOSCOPY;  Service: Pulmonary;;   BRONCHIAL NEEDLE ASPIRATION BIOPSY  12/10/2020   Procedure: BRONCHIAL NEEDLE ASPIRATION BIOPSIES;  Surgeon: Brandon Gobble, MD;  Location: [facility] ENDOSCOPY;  Service: Pulmonary;;   COLONOSCOPY     COLONOSCOPY W/ POLYPECTOMY  03/10/2009   LARYNGOSCOPY  03/10/1997   VIDEO BRONCHOSCOPY WITH ENDOBRONCHIAL NAVIGATION N/A 12/10/2020   Procedure: ROBOTIC [place];  Surgeon: Brandon Gobble, MD;  Location: [place] ENDOSCOPY;  Service: Pulmonary;  Laterality: N/A;    REVIEW OF SYSTEMS:   Review of Systems  Constitutional: Negative for appetite change, chills, fatigue, fever and unexpected weight change.  HENT:   Negative for mouth sores, nosebleeds, sore throat and trouble swallowing.   Eyes: Negative for eye problems and icterus.  Respiratory: Negative for cough, hemoptysis, shortness of breath and wheezing.   Cardiovascular: Negative for chest pain and leg swelling.  Gastrointestinal: Negative for abdominal pain, constipation, diarrhea, nausea and vomiting.  Genitourinary: Negative for bladder incontinence, difficulty urinating, dysuria, frequency and hematuria.   Musculoskeletal: Negative for back pain, gait problem, neck pain and neck stiffness.  Skin: Negative for itching and rash.  Neurological: Negative for dizziness, extremity weakness, gait problem, headaches, light-headedness and seizures.  Hematological:  Negative for adenopathy. Does not bruise/bleed easily.  Psychiatric/Behavioral: Negative for confusion, depression and sleep disturbance. The patient is not nervous/anxious.     PHYSICAL EXAMINATION:  There were no vitals taken for this visit.  ECOG PERFORMANCE STATUS: {CHL ONC ECOG [id number]  Physical Exam  Constitutional: Oriented to person, place, and time and well-developed, well-nourished, and in no distress. No distress.  HENT:  Head: Normocephalic and atraumatic.  Mouth/Throat: Oropharynx is clear and moist. No oropharyngeal exudate.  Eyes: Conjunctivae are normal. Right eye exhibits no discharge. Left eye exhibits no discharge. No scleral icterus.  Neck: Normal range of motion. Neck supple.  Cardiovascular: Normal rate, regular rhythm, normal heart sounds and intact distal pulses.   Pulmonary/Chest: Effort normal and breath sounds normal. No respiratory distress. No wheezes. No rales.  Abdominal: Soft. Bowel sounds are normal. Exhibits no distension and no mass. There is no tenderness.  Musculoskeletal: Normal range of motion. Exhibits no edema.  Lymphadenopathy:    No cervical adenopathy.  Neurological: Alert and oriented to person, place, and time. Exhibits normal muscle tone. Gait normal. Coordination normal.  Skin: Skin is warm and dry. No rash noted. Not diaphoretic. No erythema. No pallor.  Psychiatric: Mood, memory and judgment  normal.  Vitals reviewed.  LABORATORY DATA: Lab Results  Component Value Date   WBC 4.4 05/26/2022   HGB 12.4 (L) 05/26/2022   HCT 35.0 (L) 05/26/2022   MCV 86.6 05/26/2022   PLT 115 (L) 05/26/2022      Chemistry      Component Value Date/Time   NA 137 05/26/2022 1229   K 3.9 05/26/2022 1229   CL 102 05/26/2022 1229   CO2 24 05/26/2022 1229   BUN 12 05/26/2022 1229   CREATININE 0.88 05/26/2022 1229      Component Value Date/Time   CALCIUM 9.2 05/26/2022 1229   ALKPHOS 58 05/26/2022 1229   AST 21 05/26/2022 1229   ALT 27  05/26/2022 1229   BILITOT 0.3 05/26/2022 1229       RADIOGRAPHIC STUDIES:  No results found.   ASSESSMENT/PLAN:  This is a very pleasant 72 year old Caucasian male with recurrent and metastatic non-small cell lung cancer, adenocarcinoma.  He was initially diagnosed as a stage IIIa (T4, N1, M0).  He presented with a right upper lobe mass and right hilar and mediastinal lymphadenopathy.  He was initially diagnosed in October 2022.  The patient developed metastatic disease with bilateral pulmonary nodules in January 2024. He does not have any actionable mutations by foundation one.   The patient initially underwent 3 cycles of neoadjuvant systemic chemotherapy with carboplatin, Alimta, nivolumab.  Unfortunately the patient did not have significant improvement in his disease and actually developed disease progression.   He then underwent a course of concurrent chemoradiation.   He is currently on consolidation immunotherapy with Imfinzi 1500 mg IV every 4 weeks and is status post 9 cycles.  At the patient's last appointment, he had a restaging CT scan that showed suspicious findings for disease progression. He had a PET scan to further evaluate this, showing  progression with bilateral pulmonary nodules.  Therefore, he was started on systemic chemotherapy with carboplatin for an AUC of 5, Alimta 500 mg/m, and Keytruda 200 mg IV every 3 weeks. He underwent his first dose of treatment on 05/13/22. He is status post 1 cycle and tolerated it well.   Labs were reviewed. Recommend he *** proceed with cycle #2 today as scheduled.   We will see him back for a follow up visit in 3 weeks for evaluation and repeat blood work before starting cycle #3.   He will continue to follow with his endocrinologist for his adrenal insuffiencey.   The patient was advised to call immediately if she has any concerning symptoms in the interval. The patient voices understanding of current disease status and treatment  options and is in agreement with the current care plan. All questions were answered. The patient knows to call the clinic with any problems, questions or concerns. We can certainly see the patient much sooner if necessary        No orders of the defined types were placed in this encounter.    I spent {CHL ONC TIME VISIT - [id number] counseling the patient face to face. The total time spent in the appointment was {CHL ONC TIME VISIT - [id number].  Makinzie Considine L Farrin Shadle, PA-C 05/29/22

## 2022-05-30 MED FILL — Fosaprepitant Dimeglumine For IV Infusion 150 MG (Base Eq): INTRAVENOUS | Qty: 5 | Status: AC

## 2022-05-30 MED FILL — Dexamethasone Sodium Phosphate Inj 100 MG/10ML: INTRAMUSCULAR | Qty: 1 | Status: AC

## 2022-06-02 ENCOUNTER — Inpatient Hospital Stay: Payer: Medicare Other

## 2022-06-02 ENCOUNTER — Telehealth: Payer: Self-pay | Admitting: Internal Medicine

## 2022-06-02 ENCOUNTER — Inpatient Hospital Stay: Payer: Medicare Other | Admitting: Physician Assistant

## 2022-06-02 NOTE — Telephone Encounter (Signed)
Rescheduled all upcoming appointments per 03/25 scheduling message. Patient is notified.

## 2022-06-03 ENCOUNTER — Ambulatory Visit: Payer: Medicare Other | Admitting: Internal Medicine

## 2022-06-03 ENCOUNTER — Other Ambulatory Visit: Payer: Medicare Other

## 2022-06-03 ENCOUNTER — Ambulatory Visit: Payer: Medicare Other

## 2022-06-05 NOTE — Progress Notes (Signed)
[facility] OFFICE PROGRESS NOTE  [name], MD [place] [id number]  DIAGNOSIS: Recurrent/metastatic Non-Small Cell Lung Cancer, adenocarcinoma. He initially presented as  Stage IIIA (T4, N0, M0) non-small cell lung cancer, favoring adenocarcinoma presented with large right lower lobe lung mass with no mediastinal lymphadenopathy or extrathoracic metastasis diagnosed in October 2022. Had disease progression with enlarging mass and new right hilar and mediastinal lymph nodes in December 2022. He was found to have bilateral pulmonary nodules indicative of metastatic disease in January 2024   Foundation One: No actionable mutations  PRIOR THERAPY: 1) Neoadjuvant treatment with carboplatin for AUC of 5, Alimta 500 Mg/M2 and nivolumab 360 Mg IV every 3 weeks.  First dose January 23, 2021.  Status post 3 cycles. 2) Concurrent chemoradiation with carboplatin for an AUC of 2 and paclitaxel 45 mg per metered squared.  First dose expected on 04/01/2021.  Status post 4 cycles 3) Consolidation treatment with immunotherapy with Imfinzi 1500 Mg IV every 4 weeks.  First dose on Jul 08, 2021.  Status post 9 cycles.   CURRENT THERAPY: Palliative systemic chemotherapy with carboplatin for an AUC of 5 Alimta 500 mg/m, Keytruda 200 mg IV every 3 weeks. First dose expected on 05/13/2022.    INTERVAL HISTORY: Brandon Rubio. 72 y.o. male returns to the clinic today for a follow-up visit.  The patient was last seen by myself and Dr. Julien Nordmann on 04/15/2022. At that point in time, the patient had a restaging imaging studies showing disease progression with enlarging bilateral pulmonary nodules. Therefore, he started systemic chemotherapy and immunotherapy on 05/13/22.  The patient states that he was "sick as a dog" after the first infusion which lasted approximately 8 days.  When asked to clarify his symptoms, he states that he had significant queasiness without any vomiting.   He was only taking his antiemetic 1 time per day as opposed to 6 hours as needed for nausea and vomiting.  He did not call the clinic to be seen in the interval.  He was supposed to undergo cycle #2 last week but had an upper respiratory infection.  Today he is feeling fairly well.  He denies any fevers or unexplained weight loss.  He denies any chest pain, cough, or hemoptysis.  He reports stable dyspnea on exertion. He states overall his breathing is the same and "pretty good". Denies any vomiting, diarrhea, or constipation.  Denies any headache or visual changes.  Denies any rashes or skin changes.  He is here today for evaluation and to review his labs before starting cycle #2.    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  MEDICATIONS:  Current Outpatient Medications  Medication Sig Dispense Refill   ondansetron (ZOFRAN) 8 MG tablet Take 1 tablet (8 mg total) by mouth every 8 (eight) hours as needed for nausea or vomiting. 30 tablet 2   aspirin EC 81 MG tablet Take 81 mg by mouth in  the morning. Swallow whole.     bisacodyl (DULCOLAX) 5 MG EC tablet Take 5 mg by mouth daily as needed for moderate constipation.     folic acid (FOLVITE) 1 MG tablet Take 1 tablet (1 mg total) by mouth daily. 30 tablet 2   HYDROcodone-acetaminophen (NORCO) 5-325 MG tablet Take 1 tablet by mouth every 6 (six) hours as needed for moderate pain. 60 tablet 0   metFORMIN (GLUCOPHAGE) 1000 MG tablet TAKE 1 TABLET(1000 MG) BY MOUTH TWICE DAILY WITH A MEAL 180 tablet 1   Multiple  Vitamins-Minerals (CENTRUM SILVER 50+MEN) TABS Take 1 tablet by mouth daily with breakfast.     omeprazole (PRILOSEC) 20 MG capsule TAKE 1 CAPSULE BY MOUTH TWICE DAILY BEFORE A MEAL 180 capsule 1   predniSONE (DELTASONE) 5 MG tablet Take 1.5 tablets (7.5 mg total) by mouth daily with breakfast. 135 tablet 2   prochlorperazine (COMPAZINE) 10 MG tablet TAKE 1 TABLET(10 MG) BY MOUTH EVERY 6 HOURS AS NEEDED FOR NAUSEA OR VOMITING (Patient not taking: Reported on 04/15/2022) 30 tablet 0   simvastatin (ZOCOR) 40 MG tablet TAKE 1 TABLET(40 MG) BY MOUTH DAILY AT 6 PM 90 tablet 3   No current facility-administered medications for this visit.    SURGICAL HISTORY:  Past Surgical History:  Procedure Laterality Date   BRONCHIAL BIOPSY  12/10/2020   Procedure: BRONCHIAL BIOPSIES;  Surgeon: Collene Gobble, MD;  Location: [facility] ENDOSCOPY;  Service: Pulmonary;;   BRONCHIAL BRUSHINGS  12/10/2020   Procedure: BRONCHIAL BRUSHINGS;  Surgeon: Collene Gobble, MD;  Location: [facility] ENDOSCOPY;  Service: Pulmonary;;   BRONCHIAL NEEDLE ASPIRATION BIOPSY  12/10/2020   Procedure: BRONCHIAL NEEDLE ASPIRATION BIOPSIES;  Surgeon: Collene Gobble, MD;  Location: [facility] ENDOSCOPY;  Service: Pulmonary;;   COLONOSCOPY     COLONOSCOPY W/ POLYPECTOMY  03/10/2009   LARYNGOSCOPY  03/10/1997   VIDEO BRONCHOSCOPY WITH ENDOBRONCHIAL NAVIGATION N/A 12/10/2020   Procedure: ROBOTIC [place];  Surgeon: Collene Gobble, MD;  Location: [place] ENDOSCOPY;  Service: Pulmonary;  Laterality: N/A;    REVIEW OF SYSTEMS:   Review of Systems  Constitutional: Positive for stable fatigue. Negative for appetite change, chills,  fever and unexpected weight change.  HENT: Negative for mouth sores, nosebleeds, sore throat and trouble swallowing.   Eyes: Negative for eye problems and icterus.  Respiratory: Negative for cough, hemoptysis, shortness of breath and wheezing.   Cardiovascular: Negative for chest pain and leg  swelling.  Gastrointestinal: Negative for abdominal pain, constipation, diarrhea, nausea (none at this time) and vomiting.  Genitourinary: Negative for bladder incontinence, difficulty urinating, dysuria, frequency and hematuria.   Musculoskeletal: Negative for back pain, gait problem, neck pain and neck stiffness.  Skin: Negative for itching and rash.  Neurological: Negative for dizziness, extremity weakness, gait problem, headaches, light-headedness and seizures.  Hematological: Negative for adenopathy. Does not bruise/bleed easily.  Psychiatric/Behavioral: Negative for confusion, depression and sleep disturbance. The patient is not nervous/anxious.     PHYSICAL EXAMINATION:  Blood pressure 132/70, pulse 95, temperature 97.7 F (36.5 C), resp. rate 20, weight 196 lb (88.9 kg), SpO2 97 %.  ECOG PERFORMANCE STATUS: 1  Physical Exam  Constitutional: Oriented to person, place, and time and well-developed, well-nourished, and in no distress.  HENT:  Head: Normocephalic and atraumatic.  Mouth/Throat: Oropharynx is clear and moist. No oropharyngeal exudate.  Eyes: Conjunctivae are normal. Right eye exhibits no discharge. Left eye exhibits no discharge. No scleral icterus.  Neck: Normal range of motion. Neck  supple.  Cardiovascular: Normal rate, regular rhythm, normal heart sounds and intact distal pulses.   Pulmonary/Chest: Effort normal and breath sounds normal. No respiratory distress. No wheezes. No rales.  Abdominal: Soft. Bowel sounds are normal. Exhibits no distension and no mass. There is no tenderness.  Musculoskeletal: Normal range of motion. Exhibits no edema.  Lymphadenopathy:    No cervical adenopathy.  Neurological: Alert and oriented to person, place, and time. Exhibits normal muscle tone. Gait normal. Coordination normal.  Skin: Skin is warm and dry. No rash noted. Not diaphoretic. No erythema. No pallor.  Psychiatric: Mood, memory and judgment normal.  Vitals  reviewed.  LABORATORY DATA: Lab Results  Component Value Date   WBC 9.8 06/09/2022   HGB 12.1 (L) 06/09/2022   HCT 35.1 (L) 06/09/2022   MCV 87.3 06/09/2022   PLT 191 06/09/2022      Chemistry      Component Value Date/Time   NA 140 06/09/2022 1024   K 3.7 06/09/2022 1024   CL 106 06/09/2022 1024   CO2 24 06/09/2022 1024   BUN 13 06/09/2022 1024   CREATININE 0.93 06/09/2022 1024      Component Value Date/Time   CALCIUM 9.0 06/09/2022 1024   ALKPHOS 56 06/09/2022 1024   AST 15 06/09/2022 1024   ALT 16 06/09/2022 1024   BILITOT 0.4 06/09/2022 1024       RADIOGRAPHIC STUDIES:  No results found.   ASSESSMENT/PLAN:  This is a very pleasant 72 year old Caucasian male with recurrent and metastatic non-small cell lung cancer, adenocarcinoma.  He was initially diagnosed as a stage IIIa (T4, N1, M0).  He presented with a right upper lobe mass and right hilar and mediastinal lymphadenopathy.  He was initially diagnosed in October 2022.  The patient developed metastatic disease with bilateral pulmonary nodules in January 2024. He does not have any actionable mutations by foundation one.   The patient initially underwent 3 cycles of neoadjuvant systemic chemotherapy with carboplatin, Alimta, nivolumab.  Unfortunately the patient did not have significant improvement in his disease and actually developed disease progression.   He then underwent a course of concurrent chemoradiation.   He is currently on consolidation immunotherapy with Imfinzi 1500 mg IV every 4 weeks and is status post 9 cycles.  At the patient's last appointment, he had a restaging CT scan that showed suspicious findings for disease progression. He had a PET scan to further evaluate this, showing  progression with bilateral pulmonary nodules.  Therefore, he was started on systemic chemotherapy with carboplatin for an AUC of 5, Alimta 500 mg/m, and Keytruda 200 mg IV every 3 weeks. He underwent his first dose of  treatment on 05/13/22. He is status post 1 cycle and tolerated it well.   Labs were reviewed.  He discussed that if his treatment is affecting his quality of life significantly, we can always adjust the dose.  However it sounds like he experienced nausea without vomiting and was not taking antiemetics as prescribed.  The patient was in agreement to proceed with cycle #2 at the same dose today with adjustments in his symptom management. He will take his Compazine in the a.m. for the next 5 days to see if he can stay ahead of his nausea. I also sent him Zofran to alternate with Compazine if needed starting day 3 after chemotherapy.  I also strongly encouraged him to call us in the interval if he needs to be seen sooner for IV fluids and IV antiemetics.  Recommend he  proceed with cycle #2 today as scheduled.   We will see him back for a follow up visit in 3 weeks for evaluation and repeat blood work before starting cycle #3.   He will continue to follow with his endocrinologist for his adrenal insufficiency.   We will monitor his labs on a weekly basis.  I also discussed with the patient that starting from cycle #5, he will start maintenance treatment with Alimta and Keytruda which will likely be more tolerable.   The patient was advised to call immediately if she has any concerning symptoms in the interval. The patient voices understanding of current disease status and treatment options and is in agreement with the current care plan. All questions were answered. The patient knows to call the clinic with any problems, questions or concerns. We can certainly see the patient much sooner if necessary   No orders of the defined types were placed in this encounter.    The total time spent in the appointment was 20-29 minutes.   Aiysha Jillson L Wing Schoch, PA-C 06/09/22

## 2022-06-06 MED FILL — Dexamethasone Sodium Phosphate Inj 100 MG/10ML: INTRAMUSCULAR | Qty: 1 | Status: AC

## 2022-06-06 MED FILL — Fosaprepitant Dimeglumine For IV Infusion 150 MG (Base Eq): INTRAVENOUS | Qty: 5 | Status: AC

## 2022-06-09 ENCOUNTER — Inpatient Hospital Stay (HOSPITAL_BASED_OUTPATIENT_CLINIC_OR_DEPARTMENT_OTHER): Payer: Medicare Other | Admitting: Physician Assistant

## 2022-06-09 ENCOUNTER — Inpatient Hospital Stay: Payer: Medicare Other | Attending: Internal Medicine

## 2022-06-09 ENCOUNTER — Other Ambulatory Visit: Payer: Self-pay

## 2022-06-09 ENCOUNTER — Inpatient Hospital Stay: Payer: Medicare Other

## 2022-06-09 VITALS — BP 132/70 | HR 95 | Temp 97.7°F | Resp 20 | Wt 196.0 lb

## 2022-06-09 DIAGNOSIS — C3491 Malignant neoplasm of unspecified part of right bronchus or lung: Secondary | ICD-10-CM | POA: Diagnosis not present

## 2022-06-09 DIAGNOSIS — Z5112 Encounter for antineoplastic immunotherapy: Secondary | ICD-10-CM

## 2022-06-09 DIAGNOSIS — C3431 Malignant neoplasm of lower lobe, right bronchus or lung: Secondary | ICD-10-CM | POA: Diagnosis not present

## 2022-06-09 DIAGNOSIS — Z5111 Encounter for antineoplastic chemotherapy: Secondary | ICD-10-CM | POA: Diagnosis not present

## 2022-06-09 DIAGNOSIS — Z79899 Other long term (current) drug therapy: Secondary | ICD-10-CM | POA: Diagnosis not present

## 2022-06-09 DIAGNOSIS — D61818 Other pancytopenia: Secondary | ICD-10-CM | POA: Insufficient documentation

## 2022-06-09 DIAGNOSIS — R110 Nausea: Secondary | ICD-10-CM | POA: Diagnosis not present

## 2022-06-09 LAB — CBC WITH DIFFERENTIAL (CANCER CENTER ONLY)
Abs Immature Granulocytes: 0.04 10*3/uL (ref 0.00–0.07)
Basophils Absolute: 0.1 10*3/uL (ref 0.0–0.1)
Basophils Relative: 1 %
Eosinophils Absolute: 0.1 10*3/uL (ref 0.0–0.5)
Eosinophils Relative: 1 %
HCT: 35.1 % — ABNORMAL LOW (ref 39.0–52.0)
Hemoglobin: 12.1 g/dL — ABNORMAL LOW (ref 13.0–17.0)
Immature Granulocytes: 0 %
Lymphocytes Relative: 13 %
Lymphs Abs: 1.2 10*3/uL (ref 0.7–4.0)
MCH: 30.1 pg (ref 26.0–34.0)
MCHC: 34.5 g/dL (ref 30.0–36.0)
MCV: 87.3 fL (ref 80.0–100.0)
Monocytes Absolute: 0.8 10*3/uL (ref 0.1–1.0)
Monocytes Relative: 8 %
Neutro Abs: 7.6 10*3/uL (ref 1.7–7.7)
Neutrophils Relative %: 77 %
Platelet Count: 191 10*3/uL (ref 150–400)
RBC: 4.02 MIL/uL — ABNORMAL LOW (ref 4.22–5.81)
RDW: 13.6 % (ref 11.5–15.5)
WBC Count: 9.8 10*3/uL (ref 4.0–10.5)
nRBC: 0 % (ref 0.0–0.2)

## 2022-06-09 LAB — CMP (CANCER CENTER ONLY)
ALT: 16 U/L (ref 0–44)
AST: 15 U/L (ref 15–41)
Albumin: 4 g/dL (ref 3.5–5.0)
Alkaline Phosphatase: 56 U/L (ref 38–126)
Anion gap: 10 (ref 5–15)
BUN: 13 mg/dL (ref 8–23)
CO2: 24 mmol/L (ref 22–32)
Calcium: 9 mg/dL (ref 8.9–10.3)
Chloride: 106 mmol/L (ref 98–111)
Creatinine: 0.93 mg/dL (ref 0.61–1.24)
GFR, Estimated: >60 mL/min (ref >60)
Glucose, Bld: 151 mg/dL — ABNORMAL HIGH (ref 70–99)
Potassium: 3.7 mmol/L (ref 3.5–5.1)
Sodium: 140 mmol/L (ref 135–145)
Total Bilirubin: 0.4 mg/dL (ref 0.3–1.2)
Total Protein: 7 g/dL (ref 6.5–8.1)

## 2022-06-09 MED ORDER — SODIUM CHLORIDE 0.9 % IV SOLN
10.0000 mg | Freq: Once | INTRAVENOUS | Status: AC
  Administered 2022-06-09: 10 mg via INTRAVENOUS
  Filled 2022-06-09: qty 1
  Filled 2022-06-09: qty 10

## 2022-06-09 MED ORDER — SODIUM CHLORIDE 0.9 % IV SOLN
500.0000 mg/m2 | Freq: Once | INTRAVENOUS | Status: AC
  Administered 2022-06-09: 1100 mg via INTRAVENOUS
  Filled 2022-06-09: qty 40

## 2022-06-09 MED ORDER — SODIUM CHLORIDE 0.9 % IV SOLN
533.0000 mg | Freq: Once | INTRAVENOUS | Status: AC
  Administered 2022-06-09: 530 mg via INTRAVENOUS
  Filled 2022-06-09: qty 53

## 2022-06-09 MED ORDER — ONDANSETRON HCL 8 MG PO TABS: 8.0000 mg | ORAL_TABLET | Freq: Three times a day (TID) | ORAL | 2 refills | Status: DC | PRN

## 2022-06-09 MED ORDER — SODIUM CHLORIDE 0.9 % IV SOLN
150.0000 mg | Freq: Once | INTRAVENOUS | Status: AC
  Administered 2022-06-09: 150 mg via INTRAVENOUS
  Filled 2022-06-09: qty 5
  Filled 2022-06-09: qty 150

## 2022-06-09 MED ORDER — SODIUM CHLORIDE 0.9 % IV SOLN: Freq: Once | INTRAVENOUS | Status: AC

## 2022-06-09 MED ORDER — PALONOSETRON HCL INJECTION 0.25 MG/5ML
0.2500 mg | Freq: Once | INTRAVENOUS | Status: AC
  Administered 2022-06-09: 0.25 mg via INTRAVENOUS
  Filled 2022-06-09: qty 5

## 2022-06-09 MED ORDER — FAMOTIDINE IN NACL 20-0.9 MG/50ML-% IV SOLN
20.0000 mg | Freq: Once | INTRAVENOUS | Status: AC
  Administered 2022-06-09: 20 mg via INTRAVENOUS
  Filled 2022-06-09: qty 50

## 2022-06-09 MED ORDER — DIPHENHYDRAMINE HCL 50 MG/ML IJ SOLN
25.0000 mg | Freq: Once | INTRAMUSCULAR | Status: AC
  Administered 2022-06-09: 25 mg via INTRAVENOUS
  Filled 2022-06-09: qty 1

## 2022-06-09 MED ORDER — SODIUM CHLORIDE 0.9 % IV SOLN
200.0000 mg | Freq: Once | INTRAVENOUS | Status: AC
  Administered 2022-06-09: 200 mg via INTRAVENOUS
  Filled 2022-06-09: qty 8

## 2022-06-09 NOTE — Patient Instructions (Signed)
[facility]  Discharge Instructions: Thank you for choosing [facility] to provide your oncology and hematology care.   If you have a lab appointment with the [facility], please go directly to the [facility] and check in at the registration area.   Wear comfortable clothing and clothing appropriate for easy access to any Portacath or PICC line.   We strive to give you quality time with your provider. You may need to reschedule your appointment if you arrive late (15 or more minutes).  Arriving late affects you and other patients whose appointments are after yours.  Also, if you miss three or more appointments without notifying the office, you may be dismissed from the clinic at the provider's discretion.      For prescription refill requests, have your pharmacy contact our office and allow 72 hours for refills to be completed.    Today you received the following chemotherapy and/or immunotherapy agents carboplatin, alimta, keytruda      To help prevent nausea and vomiting after your treatment, we encourage you to take your nausea medication as directed.  BELOW ARE SYMPTOMS THAT SHOULD BE REPORTED IMMEDIATELY: *FEVER GREATER THAN 100.4 F (38 C) OR HIGHER *CHILLS OR SWEATING *NAUSEA AND VOMITING THAT IS NOT CONTROLLED WITH YOUR NAUSEA MEDICATION *UNUSUAL SHORTNESS OF BREATH *UNUSUAL BRUISING OR BLEEDING *URINARY PROBLEMS (pain or burning when urinating, or frequent urination) *BOWEL PROBLEMS (unusual diarrhea, constipation, pain near the anus) TENDERNESS IN MOUTH AND THROAT WITH OR WITHOUT PRESENCE OF ULCERS (sore throat, sores in mouth, or a toothache) UNUSUAL RASH, SWELLING OR PAIN  UNUSUAL VAGINAL DISCHARGE OR ITCHING   Items with * indicate a potential emergency and should be followed up as soon as possible or go to the Emergency Department if any problems should occur.  Please show the CHEMOTHERAPY ALERT CARD or IMMUNOTHERAPY  ALERT CARD at check-in to the Emergency Department and triage nurse.  Should you have questions after your visit or need to cancel or reschedule your appointment, please contact [facility]  Dept: [phone]  and follow the prompts.  Office hours are 8:00 a.m. to 4:30 p.m. Monday - Friday. Please note that voicemails left after 4:00 p.m. may not be returned until the following business day.  We are closed weekends and major holidays. You have access to a nurse at all times for urgent questions. Please call the main number to the clinic Dept: [phone] and follow the prompts.   For any non-urgent questions, you may also contact your provider using MyChart. We now offer e-Visits for anyone 33 and older to request care online for non-urgent symptoms. For details visit [email or web address].   Also download the MyChart app! Go to the app store, search "MyChart", open the app, select [facility], and log in with your MyChart username and password.

## 2022-06-09 NOTE — Progress Notes (Signed)
Patient seen by Cassie Heilingoetter, PA-C  Vitals are within treatment parameters.  Labs reviewed: and are within treatment parameters.  Per physician team, patient is ready for treatment and there are NO modifications to the treatment plan.  

## 2022-06-10 ENCOUNTER — Ambulatory Visit: Payer: Medicare Other

## 2022-06-10 ENCOUNTER — Ambulatory Visit: Payer: Medicare Other | Admitting: Internal Medicine

## 2022-06-10 ENCOUNTER — Other Ambulatory Visit: Payer: Medicare Other

## 2022-06-11 ENCOUNTER — Telehealth: Payer: Self-pay | Admitting: Internal Medicine

## 2022-06-11 DIAGNOSIS — C3491 Malignant neoplasm of unspecified part of right bronchus or lung: Secondary | ICD-10-CM

## 2022-06-11 MED ORDER — HYDROCODONE-ACETAMINOPHEN 5-325 MG PO TABS
1.0000 | ORAL_TABLET | Freq: Four times a day (QID) | ORAL | 0 refills | Status: DC | PRN
Start: 2022-06-11 — End: 2022-07-15

## 2022-06-11 NOTE — Telephone Encounter (Signed)
Refill done.  

## 2022-06-11 NOTE — Telephone Encounter (Signed)
Prescription Request  06/11/2022  LOV: 02/17/2022  What is the name of the medication or equipment? HYDROcodone-acetaminophen (NORCO) 5-325 MG tablet   Have you contacted your pharmacy to request a refill? No   Which pharmacy would you like this sent to?  [facility] [place], [place] AT [facility] OF [address] [place] [address] Phone: [phone] Fax: [phone]    Patient notified that their request is being sent to the clinical staff for review and that they should receive a response within 2 business days.   Please advise at [facility]

## 2022-06-16 ENCOUNTER — Inpatient Hospital Stay: Payer: Medicare Other

## 2022-06-16 ENCOUNTER — Other Ambulatory Visit: Payer: Self-pay

## 2022-06-16 DIAGNOSIS — R110 Nausea: Secondary | ICD-10-CM | POA: Diagnosis not present

## 2022-06-16 DIAGNOSIS — C3490 Malignant neoplasm of unspecified part of unspecified bronchus or lung: Secondary | ICD-10-CM

## 2022-06-16 DIAGNOSIS — Z5111 Encounter for antineoplastic chemotherapy: Secondary | ICD-10-CM | POA: Diagnosis not present

## 2022-06-16 DIAGNOSIS — C3431 Malignant neoplasm of lower lobe, right bronchus or lung: Secondary | ICD-10-CM | POA: Diagnosis not present

## 2022-06-16 DIAGNOSIS — Z79899 Other long term (current) drug therapy: Secondary | ICD-10-CM | POA: Diagnosis not present

## 2022-06-16 DIAGNOSIS — D61818 Other pancytopenia: Secondary | ICD-10-CM | POA: Diagnosis not present

## 2022-06-16 LAB — CMP (CANCER CENTER ONLY)
ALT: 43 U/L (ref 0–44)
AST: 36 U/L (ref 15–41)
Albumin: 3.9 g/dL (ref 3.5–5.0)
Alkaline Phosphatase: 54 U/L (ref 38–126)
Anion gap: 12 (ref 5–15)
BUN: 23 mg/dL (ref 8–23)
CO2: 23 mmol/L (ref 22–32)
Calcium: 8.8 mg/dL — ABNORMAL LOW (ref 8.9–10.3)
Chloride: 102 mmol/L (ref 98–111)
Creatinine: 1.01 mg/dL (ref 0.61–1.24)
GFR, Estimated: >60 mL/min (ref >60)
Glucose, Bld: 256 mg/dL — ABNORMAL HIGH (ref 70–99)
Potassium: 4 mmol/L (ref 3.5–5.1)
Sodium: 137 mmol/L (ref 135–145)
Total Bilirubin: 0.5 mg/dL (ref 0.3–1.2)
Total Protein: 6.8 g/dL (ref 6.5–8.1)

## 2022-06-16 LAB — CBC WITH DIFFERENTIAL (CANCER CENTER ONLY)
Abs Immature Granulocytes: 0.01 10*3/uL (ref 0.00–0.07)
Basophils Absolute: 0 10*3/uL (ref 0.0–0.1)
Basophils Relative: 1 %
Eosinophils Absolute: 0.1 10*3/uL (ref 0.0–0.5)
Eosinophils Relative: 3 %
HCT: 32.3 % — ABNORMAL LOW (ref 39.0–52.0)
Hemoglobin: 11.5 g/dL — ABNORMAL LOW (ref 13.0–17.0)
Immature Granulocytes: 0 %
Lymphocytes Relative: 37 %
Lymphs Abs: 0.9 10*3/uL (ref 0.7–4.0)
MCH: 30.7 pg (ref 26.0–34.0)
MCHC: 35.6 g/dL (ref 30.0–36.0)
MCV: 86.4 fL (ref 80.0–100.0)
Monocytes Absolute: 0.2 10*3/uL (ref 0.1–1.0)
Monocytes Relative: 8 %
Neutro Abs: 1.2 10*3/uL — ABNORMAL LOW (ref 1.7–7.7)
Neutrophils Relative %: 51 %
Platelet Count: 102 10*3/uL — ABNORMAL LOW (ref 150–400)
RBC: 3.74 MIL/uL — ABNORMAL LOW (ref 4.22–5.81)
RDW: 13 % (ref 11.5–15.5)
WBC Count: 2.4 10*3/uL — ABNORMAL LOW (ref 4.0–10.5)
nRBC: 0 % (ref 0.0–0.2)

## 2022-06-20 ENCOUNTER — Other Ambulatory Visit: Payer: Self-pay | Admitting: Internal Medicine

## 2022-06-23 ENCOUNTER — Inpatient Hospital Stay (HOSPITAL_BASED_OUTPATIENT_CLINIC_OR_DEPARTMENT_OTHER): Payer: Medicare Other | Admitting: Physician Assistant

## 2022-06-23 ENCOUNTER — Inpatient Hospital Stay: Payer: Medicare Other

## 2022-06-23 ENCOUNTER — Telehealth: Payer: Self-pay

## 2022-06-23 ENCOUNTER — Telehealth: Payer: Self-pay | Admitting: Medical Oncology

## 2022-06-23 ENCOUNTER — Ambulatory Visit: Payer: Medicare Other

## 2022-06-23 ENCOUNTER — Other Ambulatory Visit: Payer: Medicare Other

## 2022-06-23 ENCOUNTER — Ambulatory Visit: Payer: Medicare Other | Admitting: Physician Assistant

## 2022-06-23 ENCOUNTER — Other Ambulatory Visit: Payer: Self-pay | Admitting: Physician Assistant

## 2022-06-23 ENCOUNTER — Other Ambulatory Visit: Payer: Self-pay

## 2022-06-23 VITALS — BP 122/76 | HR 84 | Temp 98.1°F | Resp 18 | Wt 191.8 lb

## 2022-06-23 DIAGNOSIS — C3490 Malignant neoplasm of unspecified part of unspecified bronchus or lung: Secondary | ICD-10-CM

## 2022-06-23 DIAGNOSIS — D61818 Other pancytopenia: Secondary | ICD-10-CM | POA: Diagnosis not present

## 2022-06-23 DIAGNOSIS — C3491 Malignant neoplasm of unspecified part of right bronchus or lung: Secondary | ICD-10-CM

## 2022-06-23 DIAGNOSIS — Z79899 Other long term (current) drug therapy: Secondary | ICD-10-CM | POA: Diagnosis not present

## 2022-06-23 DIAGNOSIS — R110 Nausea: Secondary | ICD-10-CM | POA: Diagnosis not present

## 2022-06-23 DIAGNOSIS — E860 Dehydration: Secondary | ICD-10-CM

## 2022-06-23 DIAGNOSIS — R42 Dizziness and giddiness: Secondary | ICD-10-CM

## 2022-06-23 DIAGNOSIS — R5382 Chronic fatigue, unspecified: Secondary | ICD-10-CM

## 2022-06-23 DIAGNOSIS — Z5111 Encounter for antineoplastic chemotherapy: Secondary | ICD-10-CM | POA: Diagnosis not present

## 2022-06-23 DIAGNOSIS — C3431 Malignant neoplasm of lower lobe, right bronchus or lung: Secondary | ICD-10-CM | POA: Diagnosis not present

## 2022-06-23 LAB — CMP (CANCER CENTER ONLY)
ALT: 47 U/L — ABNORMAL HIGH (ref 0–44)
AST: 33 U/L (ref 15–41)
Albumin: 4 g/dL (ref 3.5–5.0)
Alkaline Phosphatase: 59 U/L (ref 38–126)
Anion gap: 11 (ref 5–15)
BUN: 16 mg/dL (ref 8–23)
CO2: 23 mmol/L (ref 22–32)
Calcium: 8.4 mg/dL — ABNORMAL LOW (ref 8.9–10.3)
Chloride: 102 mmol/L (ref 98–111)
Creatinine: 1.02 mg/dL (ref 0.61–1.24)
GFR, Estimated: >60 mL/min (ref >60)
Glucose, Bld: 244 mg/dL — ABNORMAL HIGH (ref 70–99)
Potassium: 3.8 mmol/L (ref 3.5–5.1)
Sodium: 136 mmol/L (ref 135–145)
Total Bilirubin: 0.3 mg/dL (ref 0.3–1.2)
Total Protein: 7 g/dL (ref 6.5–8.1)

## 2022-06-23 LAB — CBC WITH DIFFERENTIAL (CANCER CENTER ONLY)
Abs Immature Granulocytes: 0.01 10*3/uL (ref 0.00–0.07)
Basophils Absolute: 0 10*3/uL (ref 0.0–0.1)
Basophils Relative: 0 %
Eosinophils Absolute: 0 10*3/uL (ref 0.0–0.5)
Eosinophils Relative: 1 %
HCT: 28.4 % — ABNORMAL LOW (ref 39.0–52.0)
Hemoglobin: 10.3 g/dL — ABNORMAL LOW (ref 13.0–17.0)
Immature Granulocytes: 0 %
Lymphocytes Relative: 32 %
Lymphs Abs: 0.8 10*3/uL (ref 0.7–4.0)
MCH: 30.7 pg (ref 26.0–34.0)
MCHC: 36.3 g/dL — ABNORMAL HIGH (ref 30.0–36.0)
MCV: 84.5 fL (ref 80.0–100.0)
Monocytes Absolute: 0.4 10*3/uL (ref 0.1–1.0)
Monocytes Relative: 15 %
Neutro Abs: 1.3 10*3/uL — ABNORMAL LOW (ref 1.7–7.7)
Neutrophils Relative %: 52 %
Platelet Count: 57 10*3/uL — ABNORMAL LOW (ref 150–400)
RBC: 3.36 MIL/uL — ABNORMAL LOW (ref 4.22–5.81)
RDW: 12.7 % (ref 11.5–15.5)
WBC Count: 2.5 10*3/uL — ABNORMAL LOW (ref 4.0–10.5)
nRBC: 0 % (ref 0.0–0.2)

## 2022-06-23 LAB — TSH: TSH: 0.921 u[IU]/mL (ref 0.350–4.500)

## 2022-06-23 MED ORDER — PROCHLORPERAZINE MALEATE 10 MG PO TABS
10.0000 mg | ORAL_TABLET | Freq: Once | ORAL | Status: AC
  Administered 2022-06-23: 10 mg via ORAL
  Filled 2022-06-23: qty 1

## 2022-06-23 MED ORDER — PROCHLORPERAZINE MALEATE 10 MG PO TABS: 10.0000 mg | ORAL_TABLET | Freq: Four times a day (QID) | ORAL | 0 refills | Status: DC | PRN

## 2022-06-23 MED ORDER — SODIUM CHLORIDE 0.9 % IV SOLN: Freq: Once | INTRAVENOUS | Status: AC

## 2022-06-23 NOTE — Telephone Encounter (Signed)
This RN called patient in regards to voicemail left requesting IV fluids. Per patient, he has been feeling bad since his last chemo on April 1. He reports dizziness, nausea, fatigue and constipation. Denies fevers or vomiting. He is concerned that he will not be feeling well enough to take his next chemo and is wondering if he needs a change in his treatment. [facility] appointment made and patient informed that he will be able to address his concerns with Namon Cirri, PA-C, during his [facility] visit. Patient aware of time and location of [facility] appointment.

## 2022-06-23 NOTE — Progress Notes (Signed)
Symptom Management Consult Note [facility]    Patient Care Team: [name], MD as PCP - General (Internal Medicine) Rachael Fee, MD as Attending Physician (Gastroenterology) Szabat, Vinnie Level, [facility] (Inactive) as Pharmacist (Pharmacist) [name], MD as Consulting Physician (Oncology) [name], MD as Consulting Physician (Internal Medicine) Mateo Flow, MD as Consulting Physician (Ophthalmology)    Name / MRN / DOB: Brandon Rubio  [id number]  [birth year]   Date of visit: 06/23/2022   Chief Complaint/Reason for visit: nausea and dizziness   Current Therapy: carboplatin, pembrolizumab, pemetrexed  Last treatment:  Day 1   Cycle 2 on 06/09/22   ASSESSMENT & PLAN: Patient is a 72 y.o. 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 Arbutus Ped.  I have viewed most recent oncology note and lab work.    #Recurrent and metastatic non-small cell lung cancer, adenocarcinoma  - Next appointment with oncologist is 07/01/22   #Nausea - Lengthy discussion about anti-emetics.  Based on our discussion, patient is only taking Zofran. His compazine prescription is expired, was written 07/2021 per chart review. I sent refill to the pharmacy for compazine today. Encouraged patient to use a pill box. - Patient given dose of Compazine here in clinic. He was given 1L NS for hydration support. He was tolerating PO fluids during the exam. - CMP without significant electrolyte derangement.   #Pancytopenia - CBC today with WBC 2.5, HgB 10.3, ANC 1.3, platelets 57k. No active bleeding, not anticoagulated. - Low counts related to treatment. Discussed neutropenic and bleeding precautions.   #Dizziness - Neuro exam is normal. No focal weakness.  Chart review shows no recent head imaging. Offered patient MR brain to r/o metastasis given complaint of persistent dizziness w/ nausea. He  politely declines. Discussed reason for imaging however patient "does not what to know if his cancer spreads." - Dizziness improved after IVF, dehydration could be playing role in symptoms.  -Strict ED precautions discussed should symptoms worsen.   Heme/Onc History: Oncology History  Adenocarcinoma of right lung, stage 3  12/24/2020 Initial Diagnosis   Adenocarcinoma of right lung, stage 3 (HCC)   12/24/2020 Cancer Staging   Staging form: Lung, AJCC 8th Edition - Clinical: Stage IIIA (cT4, cN0, cM0) - Signed by [name], MD on 12/24/2020   01/24/2021 - 02/14/2021 Chemotherapy   Patient is on Treatment Plan : LUNG NSCLC Neoadjuvant Pemetrexed + Carboplatin q21d x 3 Cycles     04/01/2021 - 04/22/2021 Chemotherapy   Patient is on Treatment Plan : LUNG Carboplatin / Paclitaxel + XRT q7d     07/08/2021 - 02/18/2022 Chemotherapy   Patient is on Treatment Plan : LUNG NSCLC Durvalumab (1500) q28d     05/13/2022 -  Chemotherapy   Patient is on Treatment Plan : LUNG Carboplatin (5) + Pemetrexed (500) + Pembrolizumab (200) D1 q21d Induction x 4 cycles / Maintenance Pemetrexed (500) + Pembrolizumab (200) D1 q21d         Interval history-: Brandon Rubio. is a 72 y.o. male with oncologic history as above presenting to [facility] today with chief complaint of nausea and dizziness. He presents unaccompanied to clinic.  Patient states symptoms have been ongoing x 2 weeks. He is frustrated because he has felt sick since "that 3 day medicine" wore off. (Patient is referring to the Aloxi he receives as a pre medication.) He has been taking zofran at home however nausea only mildly improves.  He has another nausea medication Compazine from an old prescription that he is taking once per day. He has nausea without vomiting. He is tolerating fluids and sips on liquids all day. Unsure how many ounces per day. His appetite is low, Patient also reports feeling dizzy.  He states it happens constantly.  He  does notice that it is worse when changing positions.  He has not had any falls or head injury.  He has no visual changes.  He is able to ambulate without assistance.  He describes the dizziness as feeling like his brain is spinning.  He states this has been ongoing since he started chemotherapy. No OTC medications taken prior to arrival.  He denies any fever, chills, URI symptoms, abdominal pain, urinary symptoms, diarrhea.     ROS  All other systems are reviewed and are negative for acute change except as noted in the HPI.    Allergies  Allergen Reactions   Penicillins Anaphylaxis and Swelling    THE THROAT SWELLS!!!! 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   Peripheral neuropathy    toes     Past Surgical History:  Procedure Laterality Date   BRONCHIAL BIOPSY  12/10/2020   Procedure: BRONCHIAL BIOPSIES;  Surgeon: Leslye Peer, MD;  Location: [facility] ENDOSCOPY;  Service: Pulmonary;;   BRONCHIAL BRUSHINGS  12/10/2020   Procedure: BRONCHIAL BRUSHINGS;  Surgeon: Leslye Peer, MD;  Location: [facility] ENDOSCOPY;  Service: Pulmonary;;   BRONCHIAL NEEDLE ASPIRATION BIOPSY  12/10/2020   Procedure: BRONCHIAL NEEDLE ASPIRATION BIOPSIES;  Surgeon: Leslye Peer, MD;  Location: [facility] ENDOSCOPY;  Service: Pulmonary;;   COLONOSCOPY     COLONOSCOPY W/ POLYPECTOMY  03/10/2009   LARYNGOSCOPY  03/10/1997   VIDEO BRONCHOSCOPY WITH ENDOBRONCHIAL NAVIGATION N/A 12/10/2020   Procedure: ROBOTIC VIDEO BRONCHOSCOPY WITH ENDOBRONCHIAL  NAVIGATION;  Surgeon: Leslye Peer, MD;  Location: [facility] ENDOSCOPY;  Service: Pulmonary;  Laterality: N/A;    Social History   Socioeconomic History   Marital status: Divorced    Spouse name: Not on file   Number of children: 0   Years of education: Not on file   Highest education level: Not on file  Occupational History   Occupation: Retired Personnel officer  Tobacco Use   Smoking status: Every Day    Packs/day: 2.00    Years: 58.00    Additional pack years: 0.00    Total pack years: 116.00    Types: Cigarettes   Smokeless tobacco: Never   Tobacco comments:    2+ packs smoked daily ARJ 12/05/20  Vaping Use   Vaping Use: Never used  Substance and Sexual Activity   Alcohol use: No    Alcohol/week: 0.0 standard drinks of alcohol    Comment:  none since 04/2012   Drug use: Never   Sexual activity: Not on file  Other Topics Concern   Not on file  Social History Narrative   REG EXERCISE  Social Determinants of Health   Financial Resource Strain: Low Risk  (06/26/2021)   Overall Financial Resource Strain (CARDIA)    Difficulty of Paying Living Expenses: Not hard at all  Food Insecurity: No Food Insecurity (06/26/2021)   Hunger Vital Sign    Worried About Running Out of Food in the Last Year: Never true    Ran Out of Food in the Last Year: Never true  Transportation Needs: No Transportation Needs (06/26/2021)   PRAPARE - Administrator, Civil Service (Medical): No    Lack of Transportation (Non-Medical): No  Physical Activity: Sufficiently Active (06/26/2021)   Exercise Vital Sign    Days of Exercise per Week: 3 days    Minutes of Exercise per Session: 60 min  Stress: No Stress Concern Present (06/26/2021)   [employer] of Occupational Health - Occupational Stress Questionnaire    Feeling of Stress : Not at all  Social Connections: Socially Isolated (06/26/2021)   Social Connection and Isolation Panel [NHANES]    Frequency of Communication with Friends  and Family: More than three times a week    Frequency of Social Gatherings with Friends and Family: Three times a week    Attends Religious Services: Never    Active Member of Clubs or Organizations: No    Attends Banker Meetings: Never    Marital Status: Divorced  Catering manager Violence: Not At Risk (06/26/2021)   Humiliation, Afraid, Rape, and Kick questionnaire    Fear of Current or Ex-Partner: No    Emotionally Abused: No    Physically Abused: No    Sexually Abused: No    Family History  Problem Relation Age of Onset   Lung cancer Mother        X 2   Colon polyps Father    Colon cancer Father 86   Coronary artery disease Father    Lung disease Father    Esophageal cancer Neg Hx    Stomach cancer Neg Hx    Rectal cancer Neg Hx      Current Outpatient Medications:    prochlorperazine (COMPAZINE) 10 MG tablet, Take 1 tablet (10 mg total) by mouth every 6 (six) hours as needed for up to 14 days for nausea or vomiting., Disp: 56 tablet, Rfl: 0   aspirin EC 81 MG tablet, Take 81 mg by mouth in the morning. Swallow whole., Disp: , Rfl:    bisacodyl (DULCOLAX) 5 MG EC tablet, Take 5 mg by mouth daily as needed for moderate constipation., Disp: , Rfl:    folic acid (FOLVITE) 1 MG tablet, Take 1 tablet (1 mg total) by mouth daily., Disp: 30 tablet, Rfl: 2   HYDROcodone-acetaminophen (NORCO) 5-325 MG tablet, Take 1 tablet by mouth every 6 (six) hours as needed for moderate pain., Disp: 60 tablet, Rfl: 0   metFORMIN (GLUCOPHAGE) 1000 MG tablet, TAKE 1 TABLET(1000 MG) BY MOUTH TWICE DAILY WITH A MEAL, Disp: 180 tablet, Rfl: 1   Multiple Vitamins-Minerals (CENTRUM SILVER 50+MEN) TABS, Take 1 tablet by mouth daily with breakfast., Disp: , Rfl:    omeprazole (PRILOSEC) 20 MG capsule, TAKE 1 CAPSULE BY MOUTH TWICE DAILY BEFORE A MEAL, Disp: 180 capsule, Rfl: 1   ondansetron (ZOFRAN) 8 MG tablet, Take 1 tablet (8 mg total) by mouth every 8 (eight) hours as needed for nausea or  vomiting., Disp: 30 tablet, Rfl: 2   predniSONE (DELTASONE) 5 MG tablet, Take 1.5 tablets (7.5 mg total) by mouth daily with breakfast.,  Disp: 135 tablet, Rfl: 2   simvastatin (ZOCOR) 40 MG tablet, TAKE 1 TABLET(40 MG) BY MOUTH DAILY AT 6 PM, Disp: 90 tablet, Rfl: 3  PHYSICAL EXAM: ECOG FS:1 - Symptomatic but completely ambulatory    Vitals:   06/23/22 1346 06/23/22 1512  BP: 104/79 122/76  Pulse: (!) 103 84  Resp: 20 18  Temp: 98.1 F (36.7 C)   TempSrc: Oral   SpO2: 98% 98%  Weight: 191 lb 12.8 oz (87 kg)    Physical Exam Vitals and nursing note reviewed.  Constitutional:      Appearance: He is not ill-appearing or toxic-appearing.  HENT:     Head: Normocephalic.     Mouth/Throat:     Mouth: Mucous membranes are dry.  Eyes:     Extraocular Movements: Extraocular movements intact.     Conjunctiva/sclera: Conjunctivae normal.  Cardiovascular:     Rate and Rhythm: Normal rate and regular rhythm.     Pulses: Normal pulses.     Heart sounds: Normal heart sounds.     Comments: HR in the 90s in the exam Pulmonary:     Effort: Pulmonary effort is normal.     Breath sounds: Normal breath sounds.  Abdominal:     General: There is no distension.  Musculoskeletal:     Cervical back: Normal range of motion.  Skin:    General: Skin is warm and dry.  Neurological:     General: No focal deficit present.     Mental Status: He is alert.     Comments: Speech is clear and goal oriented, follows commands CN III-XII intact, no facial droop Normal strength in upper and lower extremities bilaterally including dorsiflexion and plantar flexion, strong and equal grip strength Sensation normal to light and sharp touch Moves extremities without ataxia, coordination intact Normal finger to nose and rapid alternating movements Normal gait and balance          LABORATORY DATA: I have reviewed the data as listed    Latest Ref Rng & Units 06/23/2022   12:53 PM 06/16/2022    1:15 PM  06/09/2022   10:24 AM  CBC  WBC 4.0 - 10.5 K/uL 2.5  2.4  9.8   Hemoglobin 13.0 - 17.0 g/dL 83.4  19.6  22.2   Hematocrit 39.0 - 52.0 % 28.4  32.3  35.1   Platelets 150 - 400 K/uL 57  102  191         Latest Ref Rng & Units 06/23/2022   12:53 PM 06/16/2022    1:15 PM 06/09/2022   10:24 AM  CMP  Glucose 70 - 99 mg/dL 979  892  119   BUN 8 - 23 mg/dL 16  23  13    Creatinine 0.61 - 1.24 mg/dL 4.17  4.08  1.44   Sodium 135 - 145 mmol/L 136  137  140   Potassium 3.5 - 5.1 mmol/L 3.8  4.0  3.7   Chloride 98 - 111 mmol/L 102  102  106   CO2 22 - 32 mmol/L 23  23  24    Calcium 8.9 - 10.3 mg/dL 8.4  8.8  9.0   Total Protein 6.5 - 8.1 g/dL 7.0  6.8  7.0   Total Bilirubin 0.3 - 1.2 mg/dL 0.3  0.5  0.4   Alkaline Phos 38 - 126 U/L 59  54  56   AST 15 - 41 U/L 33  36  15   ALT 0 - 44 U/L  47  43  16        RADIOGRAPHIC STUDIES (from last 24 hours if applicable) I have personally reviewed the radiological images as listed and agreed with the findings in the report. No results found.      Visit Diagnosis: 1. Lightheadedness   2. Adenocarcinoma of lung, stage 4, unspecified laterality   3. Nausea without vomiting      No orders of the defined types were placed in this encounter.   All questions were answered. The patient knows to call the clinic with any problems, questions or concerns. No barriers to learning was detected.  I have spent a total of 30 minutes minutes of face-to-face and non-face-to-face time, preparing to see the patient, obtaining and/or reviewing separately obtained history, performing a medically appropriate examination, counseling and educating the patient, ordering tests, documenting clinical information in the electronic health record.    Thank you for allowing me to participate in the care of this patient.    Shanon Ace, PA-C Department of Hematology/Oncology [facility] Phone: [phone]  [phone]  [phone]    06/24/2022 11:03 AM

## 2022-06-23 NOTE — Telephone Encounter (Signed)
Not feeling well x 2 weeks. " Since last treatment ". [facility] appt today .

## 2022-06-30 ENCOUNTER — Other Ambulatory Visit: Payer: Medicare Other

## 2022-06-30 MED FILL — Dexamethasone Sodium Phosphate Inj 100 MG/10ML: INTRAMUSCULAR | Qty: 1 | Status: AC

## 2022-06-30 MED FILL — Fosaprepitant Dimeglumine For IV Infusion 150 MG (Base Eq): INTRAVENOUS | Qty: 5 | Status: AC

## 2022-07-01 ENCOUNTER — Encounter: Payer: Self-pay | Admitting: Medical Oncology

## 2022-07-01 ENCOUNTER — Inpatient Hospital Stay: Payer: Medicare Other

## 2022-07-01 ENCOUNTER — Inpatient Hospital Stay (HOSPITAL_BASED_OUTPATIENT_CLINIC_OR_DEPARTMENT_OTHER): Payer: Medicare Other | Admitting: Internal Medicine

## 2022-07-01 ENCOUNTER — Other Ambulatory Visit: Payer: Self-pay

## 2022-07-01 VITALS — BP 133/79 | HR 90 | Resp 16

## 2022-07-01 VITALS — BP 122/84 | HR 102 | Temp 97.3°F | Resp 15 | Wt 194.6 lb

## 2022-07-01 DIAGNOSIS — C3490 Malignant neoplasm of unspecified part of unspecified bronchus or lung: Secondary | ICD-10-CM

## 2022-07-01 DIAGNOSIS — C3491 Malignant neoplasm of unspecified part of right bronchus or lung: Secondary | ICD-10-CM | POA: Diagnosis not present

## 2022-07-01 DIAGNOSIS — D61818 Other pancytopenia: Secondary | ICD-10-CM | POA: Diagnosis not present

## 2022-07-01 DIAGNOSIS — Z5111 Encounter for antineoplastic chemotherapy: Secondary | ICD-10-CM | POA: Diagnosis not present

## 2022-07-01 DIAGNOSIS — R110 Nausea: Secondary | ICD-10-CM | POA: Diagnosis not present

## 2022-07-01 DIAGNOSIS — C3431 Malignant neoplasm of lower lobe, right bronchus or lung: Secondary | ICD-10-CM | POA: Diagnosis not present

## 2022-07-01 DIAGNOSIS — Z79899 Other long term (current) drug therapy: Secondary | ICD-10-CM | POA: Diagnosis not present

## 2022-07-01 LAB — CBC WITH DIFFERENTIAL (CANCER CENTER ONLY)
Abs Immature Granulocytes: 0.05 10*3/uL (ref 0.00–0.07)
Basophils Absolute: 0 10*3/uL (ref 0.0–0.1)
Basophils Relative: 1 %
Eosinophils Absolute: 0 10*3/uL (ref 0.0–0.5)
Eosinophils Relative: 1 %
HCT: 29.2 % — ABNORMAL LOW (ref 39.0–52.0)
Hemoglobin: 10.3 g/dL — ABNORMAL LOW (ref 13.0–17.0)
Immature Granulocytes: 1 %
Lymphocytes Relative: 16 %
Lymphs Abs: 1 10*3/uL (ref 0.7–4.0)
MCH: 30.9 pg (ref 26.0–34.0)
MCHC: 35.3 g/dL (ref 30.0–36.0)
MCV: 87.7 fL (ref 80.0–100.0)
Monocytes Absolute: 0.7 10*3/uL (ref 0.1–1.0)
Monocytes Relative: 11 %
Neutro Abs: 4.8 10*3/uL (ref 1.7–7.7)
Neutrophils Relative %: 70 %
Platelet Count: 171 10*3/uL (ref 150–400)
RBC: 3.33 MIL/uL — ABNORMAL LOW (ref 4.22–5.81)
RDW: 15.1 % (ref 11.5–15.5)
WBC Count: 6.7 10*3/uL (ref 4.0–10.5)
nRBC: 0 % (ref 0.0–0.2)

## 2022-07-01 LAB — CMP (CANCER CENTER ONLY)
ALT: 28 U/L (ref 0–44)
AST: 24 U/L (ref 15–41)
Albumin: 4 g/dL (ref 3.5–5.0)
Alkaline Phosphatase: 56 U/L (ref 38–126)
Anion gap: 11 (ref 5–15)
BUN: 10 mg/dL (ref 8–23)
CO2: 25 mmol/L (ref 22–32)
Calcium: 8.3 mg/dL — ABNORMAL LOW (ref 8.9–10.3)
Chloride: 102 mmol/L (ref 98–111)
Creatinine: 0.99 mg/dL (ref 0.61–1.24)
GFR, Estimated: >60 mL/min (ref >60)
Glucose, Bld: 160 mg/dL — ABNORMAL HIGH (ref 70–99)
Potassium: 3.3 mmol/L — ABNORMAL LOW (ref 3.5–5.1)
Sodium: 138 mmol/L (ref 135–145)
Total Bilirubin: 0.4 mg/dL (ref 0.3–1.2)
Total Protein: 7 g/dL (ref 6.5–8.1)

## 2022-07-01 LAB — TSH: TSH: 1.678 u[IU]/mL (ref 0.350–4.500)

## 2022-07-01 MED ORDER — SODIUM CHLORIDE 0.9 % IV SOLN
426.4000 mg | Freq: Once | INTRAVENOUS | Status: AC
  Administered 2022-07-01: 430 mg via INTRAVENOUS
  Filled 2022-07-01: qty 43

## 2022-07-01 MED ORDER — FOLIC ACID 1 MG PO TABS: 1.0000 mg | ORAL_TABLET | Freq: Every day | ORAL | 2 refills | Status: DC

## 2022-07-01 MED ORDER — PALONOSETRON HCL INJECTION 0.25 MG/5ML
0.2500 mg | Freq: Once | INTRAVENOUS | Status: AC
  Administered 2022-07-01: 0.25 mg via INTRAVENOUS
  Filled 2022-07-01: qty 5

## 2022-07-01 MED ORDER — SODIUM CHLORIDE 0.9 % IV SOLN
10.0000 mg | Freq: Once | INTRAVENOUS | Status: AC
  Administered 2022-07-01: 10 mg via INTRAVENOUS
  Filled 2022-07-01: qty 10

## 2022-07-01 MED ORDER — DIPHENHYDRAMINE HCL 50 MG/ML IJ SOLN
25.0000 mg | Freq: Once | INTRAMUSCULAR | Status: AC
  Administered 2022-07-01: 25 mg via INTRAVENOUS
  Filled 2022-07-01: qty 1

## 2022-07-01 MED ORDER — SODIUM CHLORIDE 0.9 % IV SOLN
200.0000 mg | Freq: Once | INTRAVENOUS | Status: AC
  Administered 2022-07-01: 200 mg via INTRAVENOUS
  Filled 2022-07-01: qty 200

## 2022-07-01 MED ORDER — SODIUM CHLORIDE 0.9 % IV SOLN: Freq: Once | INTRAVENOUS | Status: AC

## 2022-07-01 MED ORDER — CYANOCOBALAMIN 1000 MCG/ML IJ SOLN
1000.0000 ug | Freq: Once | INTRAMUSCULAR | Status: AC
  Administered 2022-07-01: 1000 ug via INTRAMUSCULAR
  Filled 2022-07-01: qty 1

## 2022-07-01 MED ORDER — FAMOTIDINE IN NACL 20-0.9 MG/50ML-% IV SOLN
20.0000 mg | Freq: Once | INTRAVENOUS | Status: AC
  Administered 2022-07-01: 20 mg via INTRAVENOUS
  Filled 2022-07-01: qty 50

## 2022-07-01 MED ORDER — SODIUM CHLORIDE 0.9 % IV SOLN
150.0000 mg | Freq: Once | INTRAVENOUS | Status: AC
  Administered 2022-07-01: 150 mg via INTRAVENOUS
  Filled 2022-07-01: qty 150

## 2022-07-01 MED ORDER — SODIUM CHLORIDE 0.9 % IV SOLN
400.0000 mg/m2 | Freq: Once | INTRAVENOUS | Status: AC
  Administered 2022-07-01: 900 mg via INTRAVENOUS
  Filled 2022-07-01: qty 20

## 2022-07-01 NOTE — Progress Notes (Signed)
Patient seen by Dr. Gypsy Balsam are NOT within treatment parameters.Per Dr. Arbutus Ped ,it is ok to treat pt today with Carboplatin ,alimta and keytruda and heart rate of 102.  Labs reviewed: and are within treatment parameters.  Per physician team, patient is ready for treatment and there are NO modifications to the treatment plan.

## 2022-07-01 NOTE — Patient Instructions (Signed)
[facility]  Discharge Instructions: Thank you for choosing [facility] to provide your oncology and hematology care.   If you have a lab appointment with the Cancer Center, please go directly to the Cancer Center and check in at the registration area.   Wear comfortable clothing and clothing appropriate for easy access to any Portacath or PICC line.   We strive to give you quality time with your provider. You may need to reschedule your appointment if you arrive late (15 or more minutes).  Arriving late affects you and other patients whose appointments are after yours.  Also, if you miss three or more appointments without notifying the office, you may be dismissed from the clinic at the provider's discretion.      For prescription refill requests, have your pharmacy contact our office and allow 72 hours for refills to be completed.    Today you received the following chemotherapy and/or immunotherapy agents carboplatin, alimta, keytruda      To help prevent nausea and vomiting after your treatment, we encourage you to take your nausea medication as directed.  BELOW ARE SYMPTOMS THAT SHOULD BE REPORTED IMMEDIATELY: *FEVER GREATER THAN 100.4 F (38 C) OR HIGHER *CHILLS OR SWEATING *NAUSEA AND VOMITING THAT IS NOT CONTROLLED WITH YOUR NAUSEA MEDICATION *UNUSUAL SHORTNESS OF BREATH *UNUSUAL BRUISING OR BLEEDING *URINARY PROBLEMS (pain or burning when urinating, or frequent urination) *BOWEL PROBLEMS (unusual diarrhea, constipation, pain near the anus) TENDERNESS IN MOUTH AND THROAT WITH OR WITHOUT PRESENCE OF ULCERS (sore throat, sores in mouth, or a toothache) UNUSUAL RASH, SWELLING OR PAIN  UNUSUAL VAGINAL DISCHARGE OR ITCHING   Items with * indicate a potential emergency and should be followed up as soon as possible or go to the Emergency Department if any problems should occur.  Please show the CHEMOTHERAPY ALERT CARD or IMMUNOTHERAPY  ALERT CARD at check-in to the Emergency Department and triage nurse.  Should you have questions after your visit or need to cancel or reschedule your appointment, please contact [facility]  Dept: [phone]  and follow the prompts.  Office hours are 8:00 a.m. to 4:30 p.m. Monday - Friday. Please note that voicemails left after 4:00 p.m. may not be returned until the following business day.  We are closed weekends and major holidays. You have access to a nurse at all times for urgent questions. Please call the main number to the clinic Dept: [phone] and follow the prompts.   For any non-urgent questions, you may also contact your provider using MyChart. We now offer e-Visits for anyone 18 and older to request care online for non-urgent symptoms. For details visit [email or web address].   Also download the MyChart app! Go to the app store, search "MyChart", open the app, select [facility], and log in with your MyChart username and password.   

## 2022-07-01 NOTE — Progress Notes (Signed)
[facility] [phone]   [phone]  OFFICE PROGRESS NOTE  [name], MD [address] [place] [address]  DIAGNOSIS: Stage IIIA (T4, N0, M0) non-small cell lung cancer, favoring adenocarcinoma presented with large right lower lobe lung mass with no mediastinal lymphadenopathy or extrathoracic metastasis diagnosed in October 2022. Had disease progression with enlarging mass and new right hilar and mediastinal lymph nodes in December 2022.      PRIOR THERAPY:  1) Neoadjuvant treatment with carboplatin for AUC of 5, Alimta 500 Mg/M2 and nivolumab 360 Mg IV every 3 weeks.  First dose January 23, 2021.  Status post 3 cycles. 2) Concurrent chemoradiation with carboplatin for an AUC of 2 and paclitaxel 45 mg per metered squared.  First dose expected on 04/01/2021.  Status post 4 cycles 3) Consolidation treatment with immunotherapy with Imfinzi 1500 Mg IV every 4 weeks.  First dose on Jul 08, 2021.  Status post 9 cycles.    CURRENT THERAPY: Palliative systemic chemotherapy with carboplatin for an AUC of 5, Alimta 500 mg/m, Keytruda 200 mg IV every 3 weeks. First dose on 05/13/2022.  Status post 2 cycles.  Starting him cycle #3 his dose of carboplatin will be reduced to AUC of 4 and Alimta 400 Mg/M2 secondary to intolerance.  INTERVAL HISTORY: Brandon Rubio. 72 y.o. male returns to the clinic today for follow-up visit.  The patient is feeling fine today with no concerning complaints except for persistent fatigue and weakness.  He mentioned that he has around 17 days of weakness and fatigue after the second cycle of his treatment.  He had to come to the cancer center for IV hydration.  He denied having any significant weight loss or night sweats.  He has no current nausea, vomiting, diarrhea or constipation.  He has no headache or visual changes.  He has no chest pain, shortness of breath, cough or hemoptysis.  He is here today for  evaluation before starting cycle #3.  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  MEDICATIONS:  Current Outpatient Medications  Medication Sig Dispense Refill   aspirin EC 81 MG tablet Take 81 mg by mouth in the morning. Swallow whole.     bisacodyl (DULCOLAX) 5 MG EC tablet Take 5 mg by mouth daily as needed for moderate constipation.     folic acid (FOLVITE) 1 MG tablet Take 1 tablet (1 mg total) by mouth daily. 30 tablet 2   HYDROcodone-acetaminophen (NORCO) 5-325 MG tablet Take 1 tablet by mouth every 6 (six) hours as needed for moderate pain. 60 tablet 0   metFORMIN (GLUCOPHAGE) 1000 MG tablet TAKE 1 TABLET(1000 MG) BY MOUTH TWICE DAILY WITH A MEAL 180 tablet 1   Multiple Vitamins-Minerals (CENTRUM SILVER 50+MEN) TABS Take 1 tablet by mouth daily with breakfast.     omeprazole (PRILOSEC) 20 MG capsule TAKE 1 CAPSULE BY MOUTH TWICE DAILY BEFORE A MEAL 180 capsule 1  ondansetron (ZOFRAN) 8 MG tablet Take 1 tablet (8 mg total) by mouth every 8 (eight) hours as needed for nausea or vomiting. 30 tablet 2   predniSONE (DELTASONE) 5 MG tablet Take 1.5 tablets (7.5 mg total) by mouth daily with breakfast. 135 tablet 2   prochlorperazine (COMPAZINE) 10 MG tablet Take 1 tablet (10 mg total) by mouth every 6 (six) hours as needed for up to 14 days for nausea or vomiting. 56 tablet 0   simvastatin (ZOCOR) 40 MG tablet TAKE 1 TABLET(40 MG) BY MOUTH DAILY AT 6 PM 90 tablet 3   No current facility-administered medications for this visit.    SURGICAL  HISTORY:  Past Surgical History:  Procedure Laterality Date   BRONCHIAL BIOPSY  12/10/2020   Procedure: BRONCHIAL BIOPSIES;  Surgeon: Leslye Peer, MD;  Location: [facility] ENDOSCOPY;  Service: Pulmonary;;   BRONCHIAL BRUSHINGS  12/10/2020   Procedure: BRONCHIAL BRUSHINGS;  Surgeon: Leslye Peer, MD;  Location: [facility] ENDOSCOPY;  Service: Pulmonary;;   BRONCHIAL NEEDLE ASPIRATION BIOPSY  12/10/2020   Procedure: BRONCHIAL NEEDLE ASPIRATION BIOPSIES;  Surgeon: Leslye Peer, MD;  Location: [facility] ENDOSCOPY;  Service: Pulmonary;;   COLONOSCOPY     COLONOSCOPY W/ POLYPECTOMY  03/10/2009   LARYNGOSCOPY  03/10/1997   VIDEO BRONCHOSCOPY WITH ENDOBRONCHIAL NAVIGATION N/A 12/10/2020   Procedure: ROBOTIC VIDEO BRONCHOSCOPY WITH ENDOBRONCHIAL NAVIGATION;  Surgeon: Leslye Peer, MD;  Location: [facility] ENDOSCOPY;  Service: Pulmonary;  Laterality: N/A;    REVIEW OF SYSTEMS:  A comprehensive review of systems was negative except for: Constitutional: positive for fatigue   PHYSICAL EXAMINATION: General appearance: alert, cooperative, fatigued, and no distress Head: Normocephalic, without obvious abnormality, atraumatic Neck: no adenopathy, no JVD, supple, symmetrical, trachea midline, and thyroid not enlarged, symmetric, no tenderness/mass/nodules Lymph nodes: Cervical, supraclavicular, and axillary nodes normal. Resp: clear to auscultation bilaterally Back: symmetric, no curvature. ROM normal. No CVA tenderness. Cardio: regular rate and rhythm, S1, S2 normal, no murmur, click, rub or gallop GI: soft, non-tender; bowel sounds normal; no masses,  no organomegaly Extremities: extremities normal, atraumatic, no cyanosis or edema  ECOG PERFORMANCE STATUS: 1 - Symptomatic but completely ambulatory  Blood pressure 122/84, pulse (!) 102, temperature (!) 97.3 F (36.3 C), temperature source Oral, resp. rate 15, weight 194 lb 9.6 oz (88.3 kg), SpO2 96 %.  LABORATORY DATA: Lab Results  Component Value Date   WBC  6.7 07/01/2022   HGB 10.3 (L) 07/01/2022   HCT 29.2 (L) 07/01/2022   MCV 87.7 07/01/2022   PLT 171 07/01/2022      Chemistry      Component Value Date/Time   NA 138 07/01/2022 1001   K 3.3 (L) 07/01/2022 1001   CL 102 07/01/2022 1001   CO2 25 07/01/2022 1001   BUN 10 07/01/2022 1001   CREATININE 0.99 07/01/2022 1001      Component Value Date/Time   CALCIUM 8.3 (L) 07/01/2022 1001   ALKPHOS 56 07/01/2022 1001   AST 24 07/01/2022 1001   ALT 28 07/01/2022 1001   BILITOT 0.4 07/01/2022 1001       RADIOGRAPHIC STUDIES: No results found.  ASSESSMENT AND PLAN: This is a very pleasant 72 years old white male with history of a stage IIIA (T4, N1, M0) non-small cell lung cancer, adenocarcinoma presented with right lower lobe lung mass with a right hilar and no mediastinal lymphadenopathy and no evidence of extrathoracic disease.  The patient was diagnosed in October 2022 status post 3 cycles of neoadjuvant systemic  chemotherapy with carboplatin, Alimta and nivolumab but unfortunately has no evidence for significant improvement of his disease and actually has some evidence for progression. The patient underwent a course of concurrent chemoradiation weekly carboplatin for AUC of 2 and paclitaxel 45 Mg/M2.  Status post 4 cycles. The patient had a rough time with the course of concurrent chemoradiation and he was hospitalized several times during this course. Imaging studies after the course of concurrent chemoradiation showed no concerning findings for disease progression. The patient underwent treatment with immunotherapy with Imfinzi 1500 Mg IV every 4 weeks status post 9 cycles.  This treatment was discontinued secondary to disease progression. The patient is currently on systemic chemotherapy with carboplatin, Alimta and Keytruda status post 2 cycles.  He has been tolerating the treatment well except for worsening fatigue especially after cycle #2. I recommended for the patient to proceed  with cycle #3 today but I will reduce the dose of carboplatin to AUC of 4 and Alimta 400 Mg/M2 with the standard dose of Keytruda 200 Mg IV every 3 weeks. I will arrange for the patient to have repeat CT scan of the chest, abdomen and pelvis for restaging of his disease after this cycle. He was advised to call immediately if he has any concerning symptoms in the interval. The patient voices understanding of current disease status and treatment options and is in agreement with the current care plan.  All questions were answered. The patient knows to call the clinic with any problems, questions or concerns. We can certainly see the patient much sooner if necessary.   Disclaimer: This note was dictated with voice recognition software. Similar sounding words can inadvertently be transcribed and may not be corrected upon review.

## 2022-07-02 LAB — T4: T4, Total: 10 ug/dL (ref 4.5–12.0)

## 2022-07-07 ENCOUNTER — Other Ambulatory Visit: Payer: Self-pay

## 2022-07-07 ENCOUNTER — Inpatient Hospital Stay: Payer: Medicare Other

## 2022-07-07 ENCOUNTER — Telehealth: Payer: Self-pay

## 2022-07-07 ENCOUNTER — Other Ambulatory Visit: Payer: Self-pay | Admitting: Physician Assistant

## 2022-07-07 DIAGNOSIS — Z5111 Encounter for antineoplastic chemotherapy: Secondary | ICD-10-CM | POA: Diagnosis not present

## 2022-07-07 DIAGNOSIS — C3431 Malignant neoplasm of lower lobe, right bronchus or lung: Secondary | ICD-10-CM | POA: Diagnosis not present

## 2022-07-07 DIAGNOSIS — Z79899 Other long term (current) drug therapy: Secondary | ICD-10-CM | POA: Diagnosis not present

## 2022-07-07 DIAGNOSIS — C3490 Malignant neoplasm of unspecified part of unspecified bronchus or lung: Secondary | ICD-10-CM

## 2022-07-07 DIAGNOSIS — R110 Nausea: Secondary | ICD-10-CM | POA: Diagnosis not present

## 2022-07-07 DIAGNOSIS — D61818 Other pancytopenia: Secondary | ICD-10-CM | POA: Diagnosis not present

## 2022-07-07 LAB — CBC WITH DIFFERENTIAL (CANCER CENTER ONLY)
Abs Immature Granulocytes: 0.01 10*3/uL (ref 0.00–0.07)
Basophils Absolute: 0 10*3/uL (ref 0.0–0.1)
Basophils Relative: 1 %
Eosinophils Absolute: 0 10*3/uL (ref 0.0–0.5)
Eosinophils Relative: 0 %
HCT: 28.7 % — ABNORMAL LOW (ref 39.0–52.0)
Hemoglobin: 10.4 g/dL — ABNORMAL LOW (ref 13.0–17.0)
Immature Granulocytes: 0 %
Lymphocytes Relative: 33 %
Lymphs Abs: 1 10*3/uL (ref 0.7–4.0)
MCH: 31.7 pg (ref 26.0–34.0)
MCHC: 36.2 g/dL — ABNORMAL HIGH (ref 30.0–36.0)
MCV: 87.5 fL (ref 80.0–100.0)
Monocytes Absolute: 0.2 10*3/uL (ref 0.1–1.0)
Monocytes Relative: 5 %
Neutro Abs: 1.8 10*3/uL (ref 1.7–7.7)
Neutrophils Relative %: 61 %
Platelet Count: 147 10*3/uL — ABNORMAL LOW (ref 150–400)
RBC: 3.28 MIL/uL — ABNORMAL LOW (ref 4.22–5.81)
RDW: 14.6 % (ref 11.5–15.5)
Smear Review: NORMAL
WBC Count: 3 10*3/uL — ABNORMAL LOW (ref 4.0–10.5)
nRBC: 0 % (ref 0.0–0.2)

## 2022-07-07 LAB — CMP (CANCER CENTER ONLY)
ALT: 41 U/L (ref 0–44)
AST: 30 U/L (ref 15–41)
Albumin: 4.1 g/dL (ref 3.5–5.0)
Alkaline Phosphatase: 59 U/L (ref 38–126)
Anion gap: 13 (ref 5–15)
BUN: 17 mg/dL (ref 8–23)
CO2: 23 mmol/L (ref 22–32)
Calcium: 8.6 mg/dL — ABNORMAL LOW (ref 8.9–10.3)
Chloride: 102 mmol/L (ref 98–111)
Creatinine: 1.09 mg/dL (ref 0.61–1.24)
GFR, Estimated: >60 mL/min (ref >60)
Glucose, Bld: 172 mg/dL — ABNORMAL HIGH (ref 70–99)
Potassium: 4.6 mmol/L (ref 3.5–5.1)
Sodium: 138 mmol/L (ref 135–145)
Total Bilirubin: 0.4 mg/dL (ref 0.3–1.2)
Total Protein: 7.1 g/dL (ref 6.5–8.1)

## 2022-07-07 NOTE — Telephone Encounter (Signed)
Called patient to schedule Medicare Annual Wellness Visit (AWV). Unable to reach patient.  Last date of AWV: 06/26/21  Please schedule an appointment at any time On Annual Wellness Visit Schedule.

## 2022-07-10 ENCOUNTER — Other Ambulatory Visit: Payer: Self-pay | Admitting: Physician Assistant

## 2022-07-10 DIAGNOSIS — T451X5A Adverse effect of antineoplastic and immunosuppressive drugs, initial encounter: Secondary | ICD-10-CM

## 2022-07-10 MED ORDER — PROCHLORPERAZINE MALEATE 10 MG PO TABS
10.0000 mg | ORAL_TABLET | Freq: Four times a day (QID) | ORAL | 0 refills | Status: DC | PRN
Start: 2022-07-10 — End: 2022-09-01

## 2022-07-14 ENCOUNTER — Ambulatory Visit: Payer: Medicare Other

## 2022-07-14 ENCOUNTER — Telehealth: Payer: Self-pay | Admitting: Internal Medicine

## 2022-07-14 ENCOUNTER — Inpatient Hospital Stay: Payer: Medicare Other

## 2022-07-14 ENCOUNTER — Other Ambulatory Visit: Payer: Medicare Other

## 2022-07-14 ENCOUNTER — Ambulatory Visit: Payer: Medicare Other | Admitting: Internal Medicine

## 2022-07-14 DIAGNOSIS — C3491 Malignant neoplasm of unspecified part of right bronchus or lung: Secondary | ICD-10-CM

## 2022-07-14 NOTE — Progress Notes (Signed)
[facility] OFFICE PROGRESS NOTE  [name], MD [address] [place] [address]  DIAGNOSIS: Recurrent/metastatic Non-Small Cell Lung Cancer, adenocarcinoma. He initially presented as  Stage IIIA (T4, N0, M0) non-small cell lung cancer, favoring adenocarcinoma presented with large right lower lobe lung mass with no mediastinal lymphadenopathy or extrathoracic metastasis diagnosed in October 2022. Had disease progression with enlarging mass and new right hilar and mediastinal lymph nodes in December 2022. He was found to have bilateral pulmonary nodules indicative of metastatic disease in January 2024   Foundation One: No actionable mutations  PRIOR THERAPY: 1) Neoadjuvant treatment with carboplatin for AUC of 5, Alimta 500 Mg/M2 and nivolumab 360 Mg IV every 3 weeks.  First dose January 23, 2021.  Status post 3 cycles. 2) Concurrent chemoradiation with carboplatin for an AUC of 2 and paclitaxel 45 mg per metered squared.  First dose expected on 04/01/2021.  Status post 4 cycles 3) Consolidation treatment with immunotherapy with Imfinzi 1500 Mg IV every 4 weeks.  First dose on Jul 08, 2021.  Status post 9 cycles.   CURRENT THERAPY: Palliative systemic chemotherapy with carboplatin for an AUC of 5 Alimta 500 mg/m, Keytruda 200 mg IV every 3 weeks. First dose expected on 05/13/2022. Status post 2 cycles.  Starting him cycle #3 his dose of carboplatin will be reduced to AUC of 4 and Alimta 400 Mg/M2 secondary to intolerance.   INTERVAL HISTORY: Brandon Rubio. 72 y.o. male returns to the clinic today for a follow-up visit.  The patient was last seen by Dr. Arbutus Ped 3 weeks ago.  At that point time, the patient was having some intolerance to his chemotherapy with protracted fatigue and receiving additional IV fluids.  Therefore, Dr. Arbutus Ped slightly reduced the dose of his chemotherapy which he found more a little more tolerable. He did not have as much queasiness.  He still has fatigue but was not as severe as before. He is prescribed Compazine and Zofran or antiemetics. He felt like he had a fever last week and chills about 7 days ago but did not take his temperature. He states his breathing has progressively gotten more labored with exertion. He denies major cough except he may have a mild cough first thing in the AM. Denies any chest pain or hemoptysis. Denies any diarrhea or constipation.  Denies any headache or visual changes. He feels like he is more foregetful. He states he goes to a room and cannot remember why he went there. He politely is refusing any repeat neuro-imaging.  Denies any rashes or skin changes.  He recently had a restaging CT scan.  He is here today for evaluation to review his scan results before starting cycle #4.  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  MEDICATIONS:  Current Outpatient Medications  Medication Sig Dispense Refill   aspirin EC 81 MG tablet Take 81 mg by mouth in the morning. Swallow whole.     bisacodyl (  DULCOLAX) 5 MG EC tablet Take 5 mg by mouth daily as needed for moderate constipation.     folic acid (FOLVITE) 1 MG tablet Take 1 tablet (1 mg total) by mouth daily. 30 tablet 2   HYDROcodone-acetaminophen (NORCO) 5-325 MG tablet Take 1 tablet by mouth every 6 (six) hours as needed for moderate pain. 60 tablet 0   metFORMIN (GLUCOPHAGE) 1000 MG tablet TAKE 1 TABLET(1000 MG) BY MOUTH TWICE DAILY WITH A MEAL 180 tablet 1   Multiple Vitamins-Minerals (CENTRUM SILVER 50+MEN)  TABS Take 1 tablet by mouth daily with breakfast.     omeprazole (PRILOSEC) 20 MG capsule TAKE 1 CAPSULE BY MOUTH TWICE DAILY BEFORE A MEAL 180 capsule 1   ondansetron (ZOFRAN) 8 MG tablet Take 1 tablet (8 mg total) by mouth every 8 (eight) hours as needed for nausea or vomiting. 30 tablet 2   predniSONE (DELTASONE) 5 MG tablet Take 1.5 tablets (7.5 mg total) by mouth daily with breakfast. 135 tablet 2   prochlorperazine (COMPAZINE) 10 MG tablet Take 1 tablet (10 mg total) by mouth every 6 (six) hours as needed for up to 14 days for nausea or vomiting. 60 tablet 0   simvastatin (ZOCOR) 40 MG tablet TAKE 1 TABLET(40 MG) BY MOUTH DAILY AT 6 PM 90 tablet 3   No current facility-administered medications for this visit.    SURGICAL HISTORY:  Past Surgical History:  Procedure Laterality Date   BRONCHIAL BIOPSY  12/10/2020   Procedure: BRONCHIAL BIOPSIES;  Surgeon: Leslye Peer, MD;  Location: [facility] ENDOSCOPY;  Service: Pulmonary;;   BRONCHIAL BRUSHINGS  12/10/2020   Procedure: BRONCHIAL BRUSHINGS;  Surgeon: Leslye Peer, MD;  Location: [facility] ENDOSCOPY;  Service: Pulmonary;;   BRONCHIAL NEEDLE ASPIRATION BIOPSY  12/10/2020   Procedure: BRONCHIAL NEEDLE ASPIRATION BIOPSIES;  Surgeon: Leslye Peer, MD;  Location: [facility] ENDOSCOPY;  Service: Pulmonary;;   COLONOSCOPY     COLONOSCOPY W/ POLYPECTOMY  03/10/2009   LARYNGOSCOPY  03/10/1997   VIDEO BRONCHOSCOPY WITH ENDOBRONCHIAL NAVIGATION N/A 12/10/2020   Procedure: ROBOTIC VIDEO BRONCHOSCOPY WITH ENDOBRONCHIAL NAVIGATION;  Surgeon: Leslye Peer, MD;  Location: [facility] ENDOSCOPY;  Service: Pulmonary;  Laterality: N/A;    REVIEW OF SYSTEMS:   Review of Systems  Constitutional: Positive for stable fatigue and decreased appetite following treatment.  Negative for chills, fever and unexpected weight change.  HENT: Negative for mouth sores, nosebleeds, sore throat and trouble swallowing.   Eyes: Negative for eye problems and icterus.  Respiratory: Positive  for dyspnea on exertion.  Negative for cough, hemoptysis,  and wheezing.   Cardiovascular: Negative for chest pain and leg swelling.  Gastrointestinal: Negative for abdominal pain, constipation, diarrhea, nausea and vomiting (none at this time).  Genitourinary: Negative for bladder incontinence, difficulty urinating, dysuria, frequency and hematuria.   Musculoskeletal: Negative for back pain, gait problem, neck pain and neck stiffness.  Skin: Negative for itching and rash.  Neurological: Negative for dizziness, extremity weakness, gait problem, headaches, light-headedness and seizures.  Hematological: Negative for adenopathy. Does not bruise/bleed easily.  Psychiatric/Behavioral: Negative for confusion, depression and sleep disturbance. The patient is not nervous/anxious.     PHYSICAL EXAMINATION:  Blood pressure (!) 125/59, pulse 97, temperature 97.9 F (36.6 C), temperature source Oral, resp. rate 17, height 6' (1.829 m), weight 191 lb 6 oz (86.8 kg), SpO2 96 %.  ECOG PERFORMANCE STATUS: 1  Physical Exam  Constitutional: Oriented to person, place, and time and well-developed, well-nourished, and in no distress.  HENT:  Head:  Normocephalic and atraumatic.  Mouth/Throat: Oropharynx is clear and moist. No oropharyngeal exudate.  Eyes: Conjunctivae are normal. Right eye exhibits no discharge. Left eye exhibits no discharge. No scleral icterus.  Neck: Normal range of motion. Neck supple.  Cardiovascular: Normal rate, regular rhythm, normal heart sounds and intact distal pulses.   Pulmonary/Chest: Effort normal and breath sounds normal. No respiratory distress. No wheezes. No rales.  Abdominal: Soft. Bowel sounds are normal. Exhibits no distension and no mass. There is no tenderness.  Musculoskeletal: Normal range of motion. Exhibits no edema.  Lymphadenopathy:    No cervical adenopathy.  Neurological: Alert and oriented to person, place, and time. Exhibits normal muscle tone. Gait normal.  Coordination normal.  Skin: Skin is warm and dry. No rash noted. Not diaphoretic. No erythema. No pallor.  Psychiatric: Mood, memory and judgment normal.  Vitals reviewed.  LABORATORY DATA: Lab Results  Component Value Date   WBC 5.9 07/21/2022   HGB 10.0 (L) 07/21/2022   HCT 28.8 (L) 07/21/2022   MCV 92.0 07/21/2022   PLT 229 07/21/2022      Chemistry      Component Value Date/Time   NA 139 07/21/2022 0832   K 3.7 07/21/2022 0832   CL 104 07/21/2022 0832   CO2 25 07/21/2022 0832   BUN 9 07/21/2022 0832   CREATININE 0.89 07/21/2022 0832      Component Value Date/Time   CALCIUM 8.5 (L) 07/21/2022 0832   ALKPHOS 56 07/21/2022 0832   AST 21 07/21/2022 0832   ALT 25 07/21/2022 0832   BILITOT 0.4 07/21/2022 0832       RADIOGRAPHIC STUDIES:  CT Chest W Contrast  Result Date: 07/21/2022 CLINICAL DATA:  Restaging non-small cell lung cancer. Chemotherapy and immunotherapy in progress. Radiation therapy completed. * Tracking Code: BO * EXAM: CT CHEST, ABDOMEN, AND PELVIS WITH CONTRAST TECHNIQUE: Multidetector CT imaging of the chest, abdomen and pelvis was performed following the standard protocol during bolus administration of intravenous contrast. RADIATION DOSE REDUCTION: This exam was performed according to the departmental dose-optimization program which includes automated exposure control, adjustment of the mA and/or kV according to patient size and/or use of iterative reconstruction technique. CONTRAST:  OMNIPAQUE IOHEXOL 300 MG/ML  SOLN COMPARISON:  PET-CT 04/07/2022, chest CT 03/07/2022 and abdominopelvic CT 05/10/2021. FINDINGS: CT CHEST FINDINGS Cardiovascular: No acute vascular findings. There is atherosclerosis of the aorta, great vessels and coronary arteries. The heart size is normal. There is no pericardial effusion. Mediastinum/Nodes: There are no enlarged mediastinal, hilar or axillary lymph nodes. The thyroid gland, trachea and esophagus demonstrate no  significant findings. Lungs/Pleura: Stable small right pleural effusion. There is stable chronic volume loss, architectural distortion and calcification within the right lower lobe. Multiple part solid nodules in both lungs demonstrate progressive enlargement. For example, there is a 1.3 x 1.4 cm nodule in the lingula on image 65/6 which previously measured 1.3 x 1.1 cm. Another lingular nodule measuring 2.4 x 1.4 cm on image 71/6 previously measured 1.7 x 0.9 cm. A left lower lobe nodule measuring 1.8 x 1.2 cm on image 79/6 previously measured 1.4 x 1.1 cm. Underlying moderate centrilobular and paraseptal emphysema. Musculoskeletal/Chest wall: No chest wall mass or suspicious osseous findings. CT ABDOMEN AND PELVIS FINDINGS Hepatobiliary: The liver is normal in density without suspicious focal abnormality. No evidence of gallstones, gallbladder wall thickening or biliary dilatation. 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 No pancreatic ductal dilatation or surrounding inflammation. Spleen: Normal in size without focal abnormality. Adrenals/Urinary Tract: Both adrenal glands appear normal. No evidence of urinary tract calculus, suspicious renal lesion or hydronephrosis. Unchanged cyst in the upper pole of the left kidney for which no follow-up imaging is recommended. The bladder appears unremarkable for its degree of distention. Stomach/Bowel: No enteric contrast administered. The stomach appears unremarkable for its degree of distension. No evidence of bowel wall thickening, distention or surrounding inflammatory change. The appendix appears normal. Mildly prominent stool throughout the colon. Vascular/Lymphatic: There are no enlarged abdominal or pelvic lymph nodes. 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 No evidence of large vessel occlusion. The portal, superior mesenteric and splenic veins are patent. Reproductive: The prostate gland appears stable, without significant findings. Other: No ascites, peritoneal nodularity or pneumoperitoneum. Intact abdominal wall. Musculoskeletal: No acute or significant osseous findings. IMPRESSION: 1. Progressive enlargement of multiple part solid nodules in both lungs, remaining most consistent with progressive metastatic disease. 2. No evidence of metastatic disease within the abdomen or pelvis. 3. 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 Attention on follow-up recommended. 4. Aortic Atherosclerosis (ICD10-I70.0) and Emphysema (ICD10-J43.9). Electronically Signed   By: Carey Bullocks M.D.   On: 07/21/2022 09:23   CT Abdomen Pelvis W Contrast  Result Date: 07/21/2022 CLINICAL DATA:  Restaging non-small cell lung cancer. Chemotherapy and immunotherapy in progress. Radiation therapy completed. * Tracking Code: BO * EXAM: CT CHEST, ABDOMEN, AND PELVIS WITH CONTRAST TECHNIQUE: Multidetector CT imaging of the chest, abdomen and pelvis was performed following the standard protocol during bolus administration of intravenous contrast. RADIATION DOSE REDUCTION: This exam was performed according to the departmental dose-optimization program which includes automated exposure control, adjustment of the mA and/or kV according to patient size and/or use of iterative reconstruction technique. CONTRAST:  OMNIPAQUE IOHEXOL 300 MG/ML  SOLN COMPARISON:  PET-CT 04/07/2022, chest CT 03/07/2022 and abdominopelvic CT 05/10/2021. FINDINGS: CT CHEST FINDINGS Cardiovascular: No acute vascular findings. There is atherosclerosis of the aorta, great vessels and coronary arteries. The heart size is normal. There is no pericardial effusion. Mediastinum/Nodes: There are no enlarged  mediastinal, hilar or axillary lymph nodes. The thyroid gland, trachea and esophagus demonstrate no significant findings. Lungs/Pleura: Stable small right pleural effusion. There is stable chronic volume loss, architectural distortion and calcification within the right lower lobe. Multiple part solid nodules in both lungs demonstrate progressive enlargement. For example, there is a 1.3 x 1.4 cm nodule in the lingula on image 65/6 which previously measured 1.3 x 1.1 cm. Another lingular nodule measuring 2.4 x 1.4 cm on image 71/6 previously measured 1.7 x 0.9 cm. A left lower lobe nodule measuring 1.8 x 1.2 cm on image 79/6 previously measured 1.4 x 1.1 cm. Underlying moderate centrilobular and paraseptal emphysema. Musculoskeletal/Chest wall: No chest wall mass or suspicious osseous findings. CT ABDOMEN AND PELVIS FINDINGS Hepatobiliary: The liver is normal in density without suspicious focal abnormality. No evidence of gallstones, gallbladder wall thickening or biliary dilatation. 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 No pancreatic ductal dilatation or surrounding inflammation. Spleen: Normal in size without focal abnormality. Adrenals/Urinary Tract: Both adrenal glands appear normal. No evidence of urinary tract calculus, suspicious renal  lesion or hydronephrosis. Unchanged cyst in the upper pole of the left kidney for which no follow-up imaging is recommended. The bladder appears unremarkable for its degree of distention. Stomach/Bowel: No enteric contrast administered. The stomach appears unremarkable for its degree of distension. No evidence of bowel wall thickening, distention or surrounding inflammatory change. The appendix appears normal. Mildly prominent stool throughout the colon. Vascular/Lymphatic: There are no enlarged abdominal or pelvic lymph nodes. 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 No evidence of large vessel occlusion. The portal, superior mesenteric and splenic veins are patent. Reproductive: The prostate gland appears stable, without significant findings. Other: No ascites, peritoneal nodularity or pneumoperitoneum. Intact abdominal wall. Musculoskeletal: No acute or significant osseous findings. IMPRESSION: 1. Progressive enlargement of multiple part solid nodules in both lungs, remaining most consistent with progressive metastatic disease. 2. No evidence of metastatic disease within the abdomen or pelvis. 3. 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 Attention on follow-up recommended. 4. Aortic Atherosclerosis (ICD10-I70.0) and Emphysema (ICD10-J43.9). Electronically Signed   By: Carey Bullocks M.D.   On: 07/21/2022 09:23     ASSESSMENT/PLAN:  This is a very pleasant 73 year old Caucasian male with recurrent and metastatic non-small cell lung cancer, adenocarcinoma.  He was initially diagnosed as a stage IIIa (T4, N1, M0).  He presented with a right upper lobe mass and right hilar and mediastinal lymphadenopathy.  He was initially diagnosed in October 2022.  The patient developed metastatic disease with bilateral pulmonary nodules in January 2024. He does not have any actionable mutations by foundation one.    The patient initially underwent 3 cycles of neoadjuvant systemic chemotherapy with carboplatin, Alimta, nivolumab.  Unfortunately the patient did not have significant improvement in his disease and actually developed disease progression.   He then underwent a course of concurrent chemoradiation.  He is currently on consolidation immunotherapy with Imfinzi 1500 mg IV every 4 weeks and is status post 9 cycles.   At the patient's last appointment, he had a restaging CT scan  that showed suspicious findings for disease progression. He had a PET scan to further evaluate this, showing  progression with bilateral pulmonary nodules.   Therefore, he was started on systemic chemotherapy with carboplatin for an AUC of 5, Alimta 500 mg/m, and Keytruda 200 mg IV every 3 weeks. He underwent his first dose of treatment on 05/13/22.  He is status post 3 cycles.  His dose of carboplatin was reduced to an AUC of 5 and Alimta to 400 mg/m2 starting from cycle #3 due to intolerance.  The patient recently had a restaging CT scan.  Dr. Arbutus Ped personally and independently reviewed the scan discussed results with the patient today.  The scan showed some slight increase in some of the pulmonary nodules.  Dr. Arbutus Ped does not recommend changing treatment at this time but recommends watching this closely.  Will arrange for short interval follow-up CT scan in 9 weeks.  The patient was wondering why he cannot have radiation to all of the lung nodules.  Dr. Arbutus Ped discussed that radiation is local treatment and would not prevent further areas of cancer from occurring and that radiating multiple nodules can cause progressively worsening shortness of breath/issues for which the patient already has some labored breathing from his emphysema and prior radiation..  Dr. Arbutus Ped recommends he continue on the same treatment at the same  dose for now.  We will see him back for follow-up visit in 3 weeks for evaluation repeat blood work before undergoing cycle number 5  I also discussed with the patient that starting from cycle #5, he will start maintenance treatment with Alimta and Keytruda which will likely be more tolerable.    Regarding his increased forgetfulness, I strongly encouraged the patient to consider repeat brain MRI.  He politely refused MRI.  I discussed alternatives such as CT scan of the head which he also declined.  We will revisit possibly adding CT scan of the head at his next restaging CT of  the chest, abdomen, pelvis.  Reviewed how to take his antiemetics.  He will continue to follow with his endocrinologist for his adrenal insufficiency.    We will monitor his labs on a weekly basis.  The patient was advised to call immediately if she has any concerning symptoms in the interval. The patient voices understanding of current disease status and treatment options and is in agreement with the current care plan. All questions were answered. The patient knows to call the clinic with any problems, questions or concerns. We can certainly see the patient much sooner if necessary    Orders Placed This Encounter  Procedures   CBC with Differential (Cancer Center Only)    Standing Status:   Future    Standing Expiration Date:   09/22/2023   CMP (Cancer Center only)    Standing Status:   Future    Standing Expiration Date:   09/22/2023   CBC with Differential (Cancer Center Only)    Standing Status:   Future    Standing Expiration Date:   10/13/2023   CMP (Cancer Center only)    Standing Status:   Future    Standing Expiration Date:   10/13/2023   CBC with Differential (Cancer Center Only)    Standing Status:   Future    Standing Expiration Date:   11/03/2023   CMP (Cancer Center only)    Standing Status:   Future    Standing Expiration Date:   11/03/2023   T4    Standing Status:   Future    Standing Expiration Date:   11/03/2023   TSH    Standing Status:   Future    Standing Expiration Date:   11/03/2023   CBC with Differential (Cancer Center Only)    Standing Status:   Future    Standing Expiration Date:   11/24/2023   CMP (Cancer Center only)    Standing Status:   Future    Standing Expiration Date:   11/24/2023      Johnette Abraham Miya Luviano, PA-C 07/21/22  ADDENDUM:: Hematology/Oncology Attending: I had a face-to-face encounter with the patient today.  I reviewed his record, lab, scan and recommended his care plan.  This is a very pleasant 72 years old white male with  recurrent/metastatic non-small cell lung cancer that was initially diagnosed as stage IIIa in October 2022 status post neoadjuvant systemic chemotherapy followed by a course of concurrent chemoradiation followed by consolidation immunotherapy with Imfinzi for 9 cycles discontinued secondary to disease progression.  The patient is currently undergoing systemic chemotherapy with carboplatin, Alimta and Keytruda status post 3 cycles.  He has been tolerating the treatment well except for fatigue.  He had repeat CT scan of the chest, abdomen and pelvis performed recently.  I personally and independently reviewed the scan images and discussed the result and showed the images to the patient today.  His scan showed in general stable disease except for slight increase of some of the left lung pulmonary nodules that need close monitoring. I recommended for the patient to continue his current treatment with the same regimen for now.  His other option would be chemotherapy with more adverse effect and the patient would like to avoid this for now.  We may also consider him for palliative radiotherapy for any significantly enlarging nodules. He will proceed with cycle #4 today as planned. I will see him back for follow-up visit in 3 weeks for evaluation before starting cycle #5.  Starting from cycle #5 he will be on maintenance treatment with Alimta and Keytruda every 3 weeks. The patient is in agreement with the current plan. He was advised to call immediately if he has any other concerning symptoms in the interval. The total time spent in the appointment was 30 minutes. Disclaimer: This note was dictated with voice recognition software. Similar sounding words can inadvertently be transcribed and may be missed upon review. Lajuana Matte, MD

## 2022-07-14 NOTE — Telephone Encounter (Signed)
Prescription Request  07/14/2022  LOV: 02/17/2022  What is the name of the medication or equipment?  HYDROcodone-acetaminophen (NORCO) 5-325 MG tablet   Have you contacted your pharmacy to request a refill? No   Which pharmacy would you like this sent to?  [id number] G[place] Otto, [place] - [address] AT [facility] [address] [facility] [place] [address] Phone: [phone] Fax: [phone]    Patient notified that their request is being sent to the clinical staff for review and that they should receive a response within 2 business days.   Please advise at [facility] [phone]   Patient said he would schedule an appointment if PCP told him. He said he was told not to come in if it was only for refill for above medication.

## 2022-07-15 MED ORDER — HYDROCODONE-ACETAMINOPHEN 5-325 MG PO TABS
1.0000 | ORAL_TABLET | Freq: Four times a day (QID) | ORAL | 0 refills | Status: DC | PRN
Start: 2022-07-15 — End: 2022-08-25

## 2022-07-15 NOTE — Telephone Encounter (Signed)
Refilled

## 2022-07-18 ENCOUNTER — Ambulatory Visit (HOSPITAL_COMMUNITY)
Admission: RE | Admit: 2022-07-18 | Discharge: 2022-07-18 | Disposition: A | Discharge status: Home / Self Care | Payer: Medicare Other | Source: Ambulatory Visit | Attending: Internal Medicine | Admitting: Internal Medicine

## 2022-07-18 ENCOUNTER — Encounter (HOSPITAL_COMMUNITY): Payer: Self-pay

## 2022-07-18 DIAGNOSIS — N281 Cyst of kidney, acquired: Secondary | ICD-10-CM | POA: Diagnosis not present

## 2022-07-18 DIAGNOSIS — J90 Pleural effusion, not elsewhere classified: Secondary | ICD-10-CM | POA: Diagnosis not present

## 2022-07-18 DIAGNOSIS — C3490 Malignant neoplasm of unspecified part of unspecified bronchus or lung: Secondary | ICD-10-CM | POA: Insufficient documentation

## 2022-07-18 MED ORDER — SODIUM CHLORIDE (PF) 0.9 % IJ SOLN
INTRAMUSCULAR | Status: AC
  Filled 2022-07-18: qty 50

## 2022-07-18 MED ORDER — IOHEXOL 300 MG/ML  SOLN
100.0000 mL | Freq: Once | INTRAMUSCULAR | Status: AC | PRN
  Administered 2022-07-18: 100 mL via INTRAVENOUS

## 2022-07-18 MED FILL — Dexamethasone Sodium Phosphate Inj 100 MG/10ML: INTRAMUSCULAR | Qty: 1 | Status: AC

## 2022-07-18 MED FILL — Fosaprepitant Dimeglumine For IV Infusion 150 MG (Base Eq): INTRAVENOUS | Qty: 5 | Status: AC

## 2022-07-21 ENCOUNTER — Inpatient Hospital Stay: Payer: Medicare Other

## 2022-07-21 ENCOUNTER — Inpatient Hospital Stay (HOSPITAL_BASED_OUTPATIENT_CLINIC_OR_DEPARTMENT_OTHER): Payer: Medicare Other | Admitting: Physician Assistant

## 2022-07-21 ENCOUNTER — Inpatient Hospital Stay: Payer: Medicare Other | Attending: Internal Medicine

## 2022-07-21 ENCOUNTER — Other Ambulatory Visit: Payer: Self-pay

## 2022-07-21 VITALS — BP 125/59 | HR 97 | Temp 97.9°F | Resp 17 | Ht 72.0 in | Wt 191.4 lb

## 2022-07-21 VITALS — BP 105/60 | HR 87 | Resp 16

## 2022-07-21 DIAGNOSIS — C7800 Secondary malignant neoplasm of unspecified lung: Secondary | ICD-10-CM | POA: Diagnosis not present

## 2022-07-21 DIAGNOSIS — Z923 Personal history of irradiation: Secondary | ICD-10-CM | POA: Insufficient documentation

## 2022-07-21 DIAGNOSIS — C3431 Malignant neoplasm of lower lobe, right bronchus or lung: Secondary | ICD-10-CM | POA: Insufficient documentation

## 2022-07-21 DIAGNOSIS — Z5112 Encounter for antineoplastic immunotherapy: Secondary | ICD-10-CM

## 2022-07-21 DIAGNOSIS — C3491 Malignant neoplasm of unspecified part of right bronchus or lung: Secondary | ICD-10-CM

## 2022-07-21 DIAGNOSIS — Z79899 Other long term (current) drug therapy: Secondary | ICD-10-CM | POA: Diagnosis not present

## 2022-07-21 DIAGNOSIS — E2740 Unspecified adrenocortical insufficiency: Secondary | ICD-10-CM | POA: Diagnosis not present

## 2022-07-21 DIAGNOSIS — Z5111 Encounter for antineoplastic chemotherapy: Secondary | ICD-10-CM | POA: Diagnosis not present

## 2022-07-21 LAB — CMP (CANCER CENTER ONLY)
ALT: 25 U/L (ref 0–44)
AST: 21 U/L (ref 15–41)
Albumin: 4.1 g/dL (ref 3.5–5.0)
Alkaline Phosphatase: 56 U/L (ref 38–126)
Anion gap: 10 (ref 5–15)
BUN: 9 mg/dL (ref 8–23)
CO2: 25 mmol/L (ref 22–32)
Calcium: 8.5 mg/dL — ABNORMAL LOW (ref 8.9–10.3)
Chloride: 104 mmol/L (ref 98–111)
Creatinine: 0.89 mg/dL (ref 0.61–1.24)
GFR, Estimated: >60 mL/min (ref >60)
Glucose, Bld: 141 mg/dL — ABNORMAL HIGH (ref 70–99)
Potassium: 3.7 mmol/L (ref 3.5–5.1)
Sodium: 139 mmol/L (ref 135–145)
Total Bilirubin: 0.4 mg/dL (ref 0.3–1.2)
Total Protein: 7.2 g/dL (ref 6.5–8.1)

## 2022-07-21 LAB — CBC WITH DIFFERENTIAL (CANCER CENTER ONLY)
Abs Immature Granulocytes: 0.05 10*3/uL (ref 0.00–0.07)
Basophils Absolute: 0 10*3/uL (ref 0.0–0.1)
Basophils Relative: 1 %
Eosinophils Absolute: 0.1 10*3/uL (ref 0.0–0.5)
Eosinophils Relative: 1 %
HCT: 28.8 % — ABNORMAL LOW (ref 39.0–52.0)
Hemoglobin: 10 g/dL — ABNORMAL LOW (ref 13.0–17.0)
Immature Granulocytes: 1 %
Lymphocytes Relative: 22 %
Lymphs Abs: 1.3 10*3/uL (ref 0.7–4.0)
MCH: 31.9 pg (ref 26.0–34.0)
MCHC: 34.7 g/dL (ref 30.0–36.0)
MCV: 92 fL (ref 80.0–100.0)
Monocytes Absolute: 0.8 10*3/uL (ref 0.1–1.0)
Monocytes Relative: 14 %
Neutro Abs: 3.6 10*3/uL (ref 1.7–7.7)
Neutrophils Relative %: 61 %
Platelet Count: 229 10*3/uL (ref 150–400)
RBC: 3.13 MIL/uL — ABNORMAL LOW (ref 4.22–5.81)
RDW: 18 % — ABNORMAL HIGH (ref 11.5–15.5)
WBC Count: 5.9 10*3/uL (ref 4.0–10.5)
nRBC: 0 % (ref 0.0–0.2)

## 2022-07-21 MED ORDER — FAMOTIDINE 20 MG IN NS 100 ML IVPB
20.0000 mg | Freq: Once | INTRAVENOUS | Status: AC
  Administered 2022-07-21: 20 mg via INTRAVENOUS
  Filled 2022-07-21: qty 100

## 2022-07-21 MED ORDER — SODIUM CHLORIDE 0.9 % IV SOLN
150.0000 mg | Freq: Once | INTRAVENOUS | Status: AC
  Administered 2022-07-21: 150 mg via INTRAVENOUS
  Filled 2022-07-21: qty 150

## 2022-07-21 MED ORDER — PALONOSETRON HCL INJECTION 0.25 MG/5ML
0.2500 mg | Freq: Once | INTRAVENOUS | Status: AC
  Administered 2022-07-21: 0.25 mg via INTRAVENOUS
  Filled 2022-07-21: qty 5

## 2022-07-21 MED ORDER — DIPHENHYDRAMINE HCL 50 MG/ML IJ SOLN
25.0000 mg | Freq: Once | INTRAMUSCULAR | Status: AC
  Administered 2022-07-21: 25 mg via INTRAVENOUS
  Filled 2022-07-21: qty 1

## 2022-07-21 MED ORDER — SODIUM CHLORIDE 0.9 % IV SOLN
10.0000 mg | Freq: Once | INTRAVENOUS | Status: AC
  Administered 2022-07-21: 10 mg via INTRAVENOUS
  Filled 2022-07-21: qty 10

## 2022-07-21 MED ORDER — SODIUM CHLORIDE 0.9 % IV SOLN: Freq: Once | INTRAVENOUS | Status: AC

## 2022-07-21 MED ORDER — SODIUM CHLORIDE 0.9 % IV SOLN
400.0000 mg/m2 | Freq: Once | INTRAVENOUS | Status: AC
  Administered 2022-07-21: 900 mg via INTRAVENOUS
  Filled 2022-07-21: qty 20

## 2022-07-21 MED ORDER — SODIUM CHLORIDE 0.9 % IV SOLN
200.0000 mg | Freq: Once | INTRAVENOUS | Status: AC
  Administered 2022-07-21: 200 mg via INTRAVENOUS
  Filled 2022-07-21: qty 200

## 2022-07-21 MED ORDER — SODIUM CHLORIDE 0.9 % IV SOLN
426.4000 mg | Freq: Once | INTRAVENOUS | Status: AC
  Administered 2022-07-21: 430 mg via INTRAVENOUS
  Filled 2022-07-21: qty 43

## 2022-07-21 NOTE — Patient Instructions (Signed)
[facility]  Discharge Instructions: Thank you for choosing [facility] to provide your oncology and hematology care.   If you have a lab appointment with the Cancer Center, please go directly to the Cancer Center and check in at the registration area.   Wear comfortable clothing and clothing appropriate for easy access to any Portacath or PICC line.   We strive to give you quality time with your provider. You may need to reschedule your appointment if you arrive late (15 or more minutes).  Arriving late affects you and other patients whose appointments are after yours.  Also, if you miss three or more appointments without notifying the office, you may be dismissed from the clinic at the provider's discretion.      For prescription refill requests, have your pharmacy contact our office and allow 72 hours for refills to be completed.    Today you received the following chemotherapy and/or immunotherapy agents carboplatin, alimta, keytruda      To help prevent nausea and vomiting after your treatment, we encourage you to take your nausea medication as directed.  BELOW ARE SYMPTOMS THAT SHOULD BE REPORTED IMMEDIATELY: *FEVER GREATER THAN 100.4 F (38 C) OR HIGHER *CHILLS OR SWEATING *NAUSEA AND VOMITING THAT IS NOT CONTROLLED WITH YOUR NAUSEA MEDICATION *UNUSUAL SHORTNESS OF BREATH *UNUSUAL BRUISING OR BLEEDING *URINARY PROBLEMS (pain or burning when urinating, or frequent urination) *BOWEL PROBLEMS (unusual diarrhea, constipation, pain near the anus) TENDERNESS IN MOUTH AND THROAT WITH OR WITHOUT PRESENCE OF ULCERS (sore throat, sores in mouth, or a toothache) UNUSUAL RASH, SWELLING OR PAIN  UNUSUAL VAGINAL DISCHARGE OR ITCHING   Items with * indicate a potential emergency and should be followed up as soon as possible or go to the Emergency Department if any problems should occur.  Please show the CHEMOTHERAPY ALERT CARD or IMMUNOTHERAPY  ALERT CARD at check-in to the Emergency Department and triage nurse.  Should you have questions after your visit or need to cancel or reschedule your appointment, please contact [facility]  Dept: [phone]  and follow the prompts.  Office hours are 8:00 a.m. to 4:30 p.m. Monday - Friday. Please note that voicemails left after 4:00 p.m. may not be returned until the following business day.  We are closed weekends and major holidays. You have access to a nurse at all times for urgent questions. Please call the main number to the clinic Dept: [phone] and follow the prompts.   For any non-urgent questions, you may also contact your provider using MyChart. We now offer e-Visits for anyone 18 and older to request care online for non-urgent symptoms. For details visit [email or web address].   Also download the MyChart app! Go to the app store, search "MyChart", open the app, select [place], and log in with your MyChart username and password.   

## 2022-07-23 ENCOUNTER — Other Ambulatory Visit: Payer: Self-pay

## 2022-07-28 ENCOUNTER — Inpatient Hospital Stay: Payer: Medicare Other

## 2022-08-08 NOTE — Progress Notes (Unsigned)
[facility] OFFICE PROGRESS NOTE  [name], MD [address] [place] [address]  DIAGNOSIS:  Recurrent/metastatic Non-Small Cell Lung Cancer, adenocarcinoma. He initially presented as  Stage IIIA (T4, N0, M0) non-small cell lung cancer, favoring adenocarcinoma presented with large right lower lobe lung mass with no mediastinal lymphadenopathy or extrathoracic metastasis diagnosed in October 2022. Had disease progression with enlarging mass and new right hilar and mediastinal lymph nodes in December 2022. He was found to have bilateral pulmonary nodules indicative of metastatic disease in January 2024   Foundation One: No actionable mutations  PRIOR THERAPY: 1) Neoadjuvant treatment with carboplatin for AUC of 5, Alimta 500 Mg/M2 and nivolumab 360 Mg IV every 3 weeks.  First dose January 23, 2021.  Status post 3 cycles. 2) Concurrent chemoradiation with carboplatin for an AUC of 2 and paclitaxel 45 mg per metered squared.  First dose expected on 04/01/2021.  Status post 4 cycles 3) Consolidation treatment with immunotherapy with Imfinzi 1500 Mg IV every 4 weeks.  First dose on Jul 08, 2021.  Status post 9 cycles.   CURRENT THERAPY: Palliative systemic chemotherapy with carboplatin for an AUC of 5 Alimta 500 mg/m, Keytruda 200 mg IV every 3 weeks. First dose expected on 05/13/2022. Status post 4 cycles.  Starting him cycle #3 his dose of carboplatin will be reduced to AUC of 4 and Alimta 400 Mg/M2 secondary to intolerance. Starting from cycle #5, he will start maintenance treatment with Alimta 400 mg/m2 and Keytruda 200 mg IV every 3 weeks.   INTERVAL HISTORY: Tzvi Pott. 72 y.o. male returns to the clinic today for a follow-up visit.  The patient was last seen by myself and Dr. Arbutus Ped 3 weeks ago. The patient is currently undergoing chemotherapy and immunotherapy.  He had had a tough time with treatment initially with fatigue and queasiness.  His dose of  chemotherapy was reduced starting from cycle #3 which was a little more tolerable.  Starting from cycle #5 (today) he will start maintenance treatment with Alimta and Keytruda which will hopefully be easier as he will no longer receive carboplatin.  Since last being seen he denies any major changes in his health.  He still has fatigue but is not as severe as it was previously. He did not have as much queasiness. He still has fatigue but was not as severe as before. He is prescribed Compazine and Zofran or antiemetics.  Denies any fevers. He states his breathing has progressively gotten more labored with exertion. He denies major cough except he may have a mild cough first thing in the AM. Denies any chest pain or hemoptysis. Denies any diarrhea or constipation.  Denies any headache or visual changes. He feels like he is more foregetful. He states he goes to a room and cannot remember why he went there.  At his last appointment, I offered repeat neuroimaging which the patient politely refused.  The patient denies any rashes or skin changes.  He is here today for evaluation repeat blood work before undergoing cycle #5.    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  MEDICATIONS:  Current Outpatient Medications  Medication Sig Dispense Refill   aspirin EC 81 MG tablet Take 81 mg by mouth in the morning. Swallow whole.     bisacodyl (DULCOLAX) 5 MG EC tablet Take 5 mg by mouth daily as needed for moderate constipation.     folic acid (FOLVITE) 1 MG tablet Take 1  tablet (1 mg total) by mouth daily. 30 tablet 2   HYDROcodone-acetaminophen (NORCO) 5-325 MG tablet Take 1 tablet by mouth every 6 (six) hours as needed for moderate pain. 60 tablet 0   metFORMIN (GLUCOPHAGE) 1000 MG tablet TAKE 1 TABLET(1000 MG) BY MOUTH TWICE DAILY WITH A MEAL 180 tablet 1   Multiple Vitamins-Minerals (CENTRUM SILVER 50+MEN) TABS Take 1 tablet by mouth daily with breakfast.     omeprazole (PRILOSEC) 20 MG capsule TAKE 1 CAPSULE BY MOUTH TWICE DAILY BEFORE A MEAL 180 capsule 1   ondansetron (ZOFRAN) 8 MG tablet Take 1 tablet (8 mg total) by mouth every 8 (eight) hours as needed for nausea or vomiting. 30 tablet 2   predniSONE (DELTASONE) 5 MG tablet Take 1.5 tablets (7.5 mg total) by mouth daily with breakfast. 135 tablet 2   prochlorperazine (COMPAZINE) 10 MG tablet Take 1 tablet (10 mg total) by mouth every 6 (six) hours as needed for up to 14 days for nausea or vomiting. 60 tablet 0   simvastatin (ZOCOR) 40 MG tablet TAKE 1 TABLET(40 MG) BY MOUTH DAILY AT 6 PM 90 tablet 3   No current facility-administered medications for this visit.    SURGICAL HISTORY:  Past Surgical History:  Procedure Laterality Date   BRONCHIAL BIOPSY  12/10/2020   Procedure: BRONCHIAL BIOPSIES;  Surgeon: Leslye Peer, MD;  Location: [facility] ENDOSCOPY;  Service: Pulmonary;;   BRONCHIAL BRUSHINGS  12/10/2020   Procedure: BRONCHIAL BRUSHINGS;  Surgeon: Leslye Peer, MD;  Location[facility] ENDOSCOPY;  Service: Pulmonary;;   BRONCHIAL NEEDLE ASPIRATION BIOPSY  12/10/2020   Procedure: BRONCHIAL NEEDLE ASPIRATION BIOPSIES;  Surgeon: Leslye Peer, MD;  Location: [facility] ENDOSCOPY;  Service: Pulmonary;;   COLONOSCOPY     COLONOSCOPY W/ POLYPECTOMY  03/10/2009   LARYNGOSCOPY  03/10/1997   VIDEO BRONCHOSCOPY WITH ENDOBRONCHIAL NAVIGATION N/A 12/10/2020   Procedure: ROBOTIC VIDEO BRONCHOSCOPY WITH ENDOBRONCHIAL NAVIGATION;  Surgeon: Leslye Peer, MD;  Location: [facility] ENDOSCOPY;  Service: Pulmonary;  Laterality:  N/A;    REVIEW OF SYSTEMS:   Review of Systems  Constitutional: Negative for appetite change, chills, fatigue, fever and unexpected weight change.  HENT:   Negative for mouth sores, nosebleeds, sore throat and trouble swallowing.   Eyes: Negative for eye problems and icterus.  Respiratory: Negative for cough, hemoptysis, shortness of breath and wheezing.   Cardiovascular: Negative for chest pain and leg swelling.  Gastrointestinal: Negative for abdominal pain, constipation, diarrhea, nausea and vomiting.  Genitourinary: Negative for bladder incontinence, difficulty urinating, dysuria, frequency and hematuria.   Musculoskeletal: Negative for back pain, gait problem, neck pain and neck stiffness.  Skin: Negative for itching and rash.  Neurological: Negative for dizziness, extremity weakness, gait problem, headaches, light-headedness and seizures.  Hematological: Negative for adenopathy. Does not bruise/bleed easily.  Psychiatric/Behavioral: Negative for confusion, depression and sleep disturbance. The patient is not nervous/anxious.     PHYSICAL EXAMINATION:  There were no vitals taken for this visit.  ECOG PERFORMANCE  STATUS: {CHL ONC ECOG [id number]  Physical Exam  Constitutional: Oriented to person, place, and time and well-developed, well-nourished, and in no distress. No distress.  HENT:  Head: Normocephalic and atraumatic.  Mouth/Throat: Oropharynx is clear and moist. No oropharyngeal exudate.  Eyes: Conjunctivae are normal. Right eye exhibits no discharge. Left eye exhibits no discharge. No scleral icterus.  Neck: Normal range of motion. Neck supple.  Cardiovascular: Normal rate, regular rhythm, normal heart sounds and intact distal pulses.   Pulmonary/Chest: Effort normal and breath sounds normal. No respiratory distress. No wheezes. No rales.  Abdominal: Soft. Bowel sounds are normal. Exhibits no distension and no mass. There is no tenderness.  Musculoskeletal: Normal  range of motion. Exhibits no edema.  Lymphadenopathy:    No cervical adenopathy.  Neurological: Alert and oriented to person, place, and time. Exhibits normal muscle tone. Gait normal. Coordination normal.  Skin: Skin is warm and dry. No rash noted. Not diaphoretic. No erythema. No pallor.  Psychiatric: Mood, memory and judgment normal.  Vitals reviewed.  LABORATORY DATA: Lab Results  Component Value Date   WBC 5.9 07/21/2022   HGB 10.0 (L) 07/21/2022   HCT 28.8 (L) 07/21/2022   MCV 92.0 07/21/2022   PLT 229 07/21/2022      Chemistry      Component Value Date/Time   NA 139 07/21/2022 0832   K 3.7 07/21/2022 0832   CL 104 07/21/2022 0832   CO2 25 07/21/2022 0832   BUN 9 07/21/2022 0832   CREATININE 0.89 07/21/2022 0832      Component Value Date/Time   CALCIUM 8.5 (L) 07/21/2022 0832   ALKPHOS 56 07/21/2022 0832   AST 21 07/21/2022 0832   ALT 25 07/21/2022 0832   BILITOT 0.4 07/21/2022 0832       RADIOGRAPHIC STUDIES:  CT Chest W Contrast  Result Date: 07/21/2022 CLINICAL DATA:  Restaging non-small cell lung cancer. Chemotherapy and immunotherapy in progress. Radiation therapy completed. * Tracking Code: BO * EXAM: CT CHEST, ABDOMEN, AND PELVIS WITH CONTRAST TECHNIQUE: Multidetector CT imaging of the chest, abdomen and pelvis was performed following the standard protocol during bolus administration of intravenous contrast. RADIATION DOSE REDUCTION: This exam was performed according to the departmental dose-optimization program which includes automated exposure control, adjustment of the mA and/or kV according to patient size and/or use of iterative reconstruction technique. CONTRAST:  OMNIPAQUE IOHEXOL 300 MG/ML  SOLN COMPARISON:  PET-CT 04/07/2022, chest CT 03/07/2022 and abdominopelvic CT 05/10/2021. FINDINGS: CT CHEST FINDINGS Cardiovascular: No acute vascular findings. There is atherosclerosis of the aorta, great vessels and coronary arteries. The heart size is  normal. There is no pericardial effusion. Mediastinum/Nodes: There are no enlarged mediastinal, hilar or axillary lymph nodes. The thyroid gland, trachea and esophagus demonstrate no significant findings. Lungs/Pleura: Stable small right pleural effusion. There is stable chronic volume loss, architectural distortion and calcification within the right lower lobe. Multiple part solid nodules in both lungs demonstrate progressive enlargement. For example, there is a 1.3 x 1.4 cm nodule in the lingula on image 65/6 which previously measured 1.3 x 1.1 cm. Another lingular nodule measuring 2.4 x 1.4 cm on image 71/6 previously measured 1.7 x 0.9 cm. A left lower lobe nodule measuring 1.8 x 1.2 cm on image 79/6 previously measured 1.4 x 1.1 cm. Underlying moderate centrilobular and paraseptal emphysema. Musculoskeletal/Chest wall: No chest wall mass or suspicious osseous findings. CT ABDOMEN AND PELVIS FINDINGS Hepatobiliary: The liver is normal in density without suspicious focal abnormality. No evidence of  gallstones, gallbladder wall thickening or biliary dilatation. 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 No pancreatic ductal dilatation or surrounding inflammation. Spleen: Normal in size without focal abnormality. Adrenals/Urinary Tract: Both adrenal glands appear normal. No evidence of urinary tract calculus, suspicious renal lesion or hydronephrosis. Unchanged cyst in the upper pole of the left kidney for which no follow-up imaging is recommended. The bladder appears unremarkable for its degree of distention. Stomach/Bowel: No enteric contrast administered. The stomach appears unremarkable for its degree of distension. No evidence of bowel wall thickening, distention or surrounding inflammatory change. The appendix appears normal. Mildly prominent stool throughout the colon.  Vascular/Lymphatic: There are no enlarged abdominal or pelvic lymph nodes. 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 No evidence of large vessel occlusion. The portal, superior mesenteric and splenic veins are patent. Reproductive: The prostate gland appears stable, without significant findings. Other: No ascites, peritoneal nodularity or pneumoperitoneum. Intact abdominal wall. Musculoskeletal: No acute or significant osseous findings. IMPRESSION: 1. Progressive enlargement of multiple part solid nodules in both lungs, remaining most consistent with progressive metastatic disease. 2. No evidence of metastatic disease within the abdomen or pelvis. 3. 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 Attention on follow-up recommended. 4. Aortic Atherosclerosis (ICD10-I70.0) and Emphysema (ICD10-J43.9). Electronically Signed   By: Carey Bullocks M.D.   On: 07/21/2022 09:23   CT Abdomen Pelvis W Contrast  Result Date: 07/21/2022 CLINICAL DATA:  Restaging non-small cell lung cancer. Chemotherapy and immunotherapy in progress. Radiation therapy completed. * Tracking Code: BO * EXAM: CT CHEST, ABDOMEN, AND PELVIS WITH CONTRAST TECHNIQUE: Multidetector CT imaging of the chest, abdomen and pelvis was performed following the standard protocol during bolus administration of intravenous contrast. RADIATION DOSE REDUCTION: This exam was performed according to the departmental dose-optimization program which includes automated exposure control, adjustment of the mA and/or kV according to patient size and/or use of iterative reconstruction technique. CONTRAST:  OMNIPAQUE IOHEXOL 300 MG/ML  SOLN COMPARISON:  PET-CT 04/07/2022, chest CT 03/07/2022 and abdominopelvic CT 05/10/2021. FINDINGS: CT CHEST FINDINGS Cardiovascular: No acute vascular findings. There is  atherosclerosis of the aorta, great vessels and coronary arteries. The heart size is normal. There is no pericardial effusion. Mediastinum/Nodes: There are no enlarged mediastinal, hilar or axillary lymph nodes. The thyroid gland, trachea and esophagus demonstrate no significant findings. Lungs/Pleura: Stable small right pleural effusion. There is stable chronic volume loss, architectural distortion and calcification within the right lower lobe. Multiple part solid nodules in both lungs demonstrate progressive enlargement. For example, there is a 1.3 x 1.4 cm nodule in the lingula on image 65/6 which previously measured 1.3 x 1.1 cm. Another lingular nodule measuring 2.4 x 1.4 cm on image 71/6 previously measured 1.7 x 0.9 cm. A left lower lobe nodule measuring 1.8 x 1.2 cm on image 79/6 previously measured 1.4 x 1.1 cm. Underlying moderate centrilobular and paraseptal emphysema. Musculoskeletal/Chest wall: No chest wall mass or suspicious osseous findings. CT ABDOMEN AND PELVIS FINDINGS Hepatobiliary: The liver is normal in density without suspicious focal abnormality. No evidence of gallstones, gallbladder wall thickening or biliary dilatation. 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  No pancreatic ductal dilatation or surrounding inflammation. Spleen: Normal in size without focal abnormality. Adrenals/Urinary Tract: Both adrenal glands appear normal. No evidence of urinary tract calculus, suspicious renal lesion or hydronephrosis. Unchanged cyst in the upper pole of the left kidney for which no follow-up imaging is recommended. The bladder appears unremarkable for its degree of distention. Stomach/Bowel: No enteric contrast administered. The stomach appears unremarkable for its degree of distension. No evidence of bowel wall thickening, distention or surrounding inflammatory  change. The appendix appears normal. Mildly prominent stool throughout the colon. Vascular/Lymphatic: There are no enlarged abdominal or pelvic lymph nodes. 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 No evidence of large vessel occlusion. The portal, superior mesenteric and splenic veins are patent. Reproductive: The prostate gland appears stable, without significant findings. Other: No ascites, peritoneal nodularity or pneumoperitoneum. Intact abdominal wall. Musculoskeletal: No acute or significant osseous findings. IMPRESSION: 1. Progressive enlargement of multiple part solid nodules in both lungs, remaining most consistent with progressive metastatic disease. 2. No evidence of metastatic disease within the abdomen or pelvis. 3. 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 Attention on follow-up recommended. 4. Aortic Atherosclerosis (ICD10-I70.0) and Emphysema (ICD10-J43.9). Electronically Signed   By: Carey Bullocks M.D.   On: 07/21/2022 09:23     ASSESSMENT/PLAN:  This is a very pleasant 72 year old Caucasian male with recurrent and metastatic non-small cell lung cancer, adenocarcinoma.  He was initially diagnosed as a stage IIIa (T4, N1, M0).  He presented with a right upper lobe mass and right hilar and mediastinal lymphadenopathy.  He was initially diagnosed in October 2022.  The patient developed metastatic disease with bilateral pulmonary nodules in January 2024. He does not have any actionable mutations by foundation one.    The patient initially underwent 3 cycles of neoadjuvant systemic chemotherapy with carboplatin, Alimta, nivolumab.  Unfortunately the patient did not have significant improvement in his disease and actually developed disease progression.  He then underwent a course of concurrent chemoradiation.   He is currently  on consolidation immunotherapy with Imfinzi 1500 mg IV every 4 weeks and is status post 9 cycles.   At the patient's last appointment, he had a restaging CT scan that showed suspicious findings for disease progression. He had a PET scan to further evaluate this, showing  progression with bilateral pulmonary nodules.  Therefore, he was started on systemic chemotherapy with carboplatin for an AUC of 5, Alimta 500 mg/m, and Keytruda 200 mg IV every 3 weeks. He underwent his first dose of treatment on 05/13/22.  He is status post 4 cycles.  His dose of carboplatin was reduced to an AUC of 5 and Alimta to 400 mg/m2 starting from cycle #3 due to intolerance.   From cycle #5, the patient will start maintenance Alimta and Keytruda.  Labs were reviewed.  Recommend that he ***with cycle #5 today's schedule.  We will see her back for follow-up visit in 3 weeks for evaluation and repeat blood work before starting cycle #6.  Reviewed how to take his antiemetics.  He will continue to follow with endocrinology for his adrenal insufficiency.  Right MRI  The patient was advised to call immediately if he has any concerning symptoms in the interval. The patient voices understanding of current disease status and treatment options and is in agreement with the current care plan. All questions were answered. The  patient knows to call the clinic with any problems, questions or concerns. We can certainly see the patient much sooner if necessary          No orders of the defined types were placed in this encounter.    I spent {CHL ONC TIME VISIT - ZOXWR:[id number]} counseling the patient face to face. The total time spent in the appointment was {CHL ONC TIME VISIT - BJYNW:[id number]}.  Evelene Roussin L Sheetal Lyall, PA-C 08/08/22

## 2022-08-11 ENCOUNTER — Inpatient Hospital Stay: Payer: Medicare Other | Attending: Internal Medicine

## 2022-08-11 ENCOUNTER — Other Ambulatory Visit: Payer: Self-pay

## 2022-08-11 ENCOUNTER — Inpatient Hospital Stay: Payer: Medicare Other

## 2022-08-11 ENCOUNTER — Inpatient Hospital Stay (HOSPITAL_BASED_OUTPATIENT_CLINIC_OR_DEPARTMENT_OTHER): Payer: Medicare Other | Admitting: Physician Assistant

## 2022-08-11 VITALS — BP 134/65 | HR 98 | Temp 98.2°F | Resp 17 | Wt 190.4 lb

## 2022-08-11 DIAGNOSIS — Z5111 Encounter for antineoplastic chemotherapy: Secondary | ICD-10-CM | POA: Diagnosis not present

## 2022-08-11 DIAGNOSIS — Z79899 Other long term (current) drug therapy: Secondary | ICD-10-CM | POA: Insufficient documentation

## 2022-08-11 DIAGNOSIS — C3491 Malignant neoplasm of unspecified part of right bronchus or lung: Secondary | ICD-10-CM

## 2022-08-11 DIAGNOSIS — Z5112 Encounter for antineoplastic immunotherapy: Secondary | ICD-10-CM | POA: Diagnosis not present

## 2022-08-11 DIAGNOSIS — C7800 Secondary malignant neoplasm of unspecified lung: Secondary | ICD-10-CM | POA: Diagnosis not present

## 2022-08-11 DIAGNOSIS — E2740 Unspecified adrenocortical insufficiency: Secondary | ICD-10-CM | POA: Insufficient documentation

## 2022-08-11 DIAGNOSIS — C3431 Malignant neoplasm of lower lobe, right bronchus or lung: Secondary | ICD-10-CM | POA: Diagnosis not present

## 2022-08-11 LAB — CBC WITH DIFFERENTIAL (CANCER CENTER ONLY)
Abs Immature Granulocytes: 0.04 10*3/uL (ref 0.00–0.07)
Basophils Absolute: 0.1 10*3/uL (ref 0.0–0.1)
Basophils Relative: 1 %
Eosinophils Absolute: 0.1 10*3/uL (ref 0.0–0.5)
Eosinophils Relative: 1 %
HCT: 29.4 % — ABNORMAL LOW (ref 39.0–52.0)
Hemoglobin: 10 g/dL — ABNORMAL LOW (ref 13.0–17.0)
Immature Granulocytes: 1 %
Lymphocytes Relative: 12 %
Lymphs Abs: 1 10*3/uL (ref 0.7–4.0)
MCH: 32.8 pg (ref 26.0–34.0)
MCHC: 34 g/dL (ref 30.0–36.0)
MCV: 96.4 fL (ref 80.0–100.0)
Monocytes Absolute: 0.9 10*3/uL (ref 0.1–1.0)
Monocytes Relative: 11 %
Neutro Abs: 6.2 10*3/uL (ref 1.7–7.7)
Neutrophils Relative %: 74 %
Platelet Count: 161 10*3/uL (ref 150–400)
RBC: 3.05 MIL/uL — ABNORMAL LOW (ref 4.22–5.81)
RDW: 17.5 % — ABNORMAL HIGH (ref 11.5–15.5)
WBC Count: 8.2 10*3/uL (ref 4.0–10.5)
nRBC: 0 % (ref 0.0–0.2)

## 2022-08-11 LAB — CMP (CANCER CENTER ONLY)
ALT: 24 U/L (ref 0–44)
AST: 22 U/L (ref 15–41)
Albumin: 4.2 g/dL (ref 3.5–5.0)
Alkaline Phosphatase: 55 U/L (ref 38–126)
Anion gap: 12 (ref 5–15)
BUN: 16 mg/dL (ref 8–23)
CO2: 22 mmol/L (ref 22–32)
Calcium: 8.9 mg/dL (ref 8.9–10.3)
Chloride: 103 mmol/L (ref 98–111)
Creatinine: 1.01 mg/dL (ref 0.61–1.24)
GFR, Estimated: >60 mL/min (ref >60)
Glucose, Bld: 147 mg/dL — ABNORMAL HIGH (ref 70–99)
Potassium: 3.8 mmol/L (ref 3.5–5.1)
Sodium: 137 mmol/L (ref 135–145)
Total Bilirubin: 0.4 mg/dL (ref 0.3–1.2)
Total Protein: 7.4 g/dL (ref 6.5–8.1)

## 2022-08-11 MED ORDER — PROCHLORPERAZINE MALEATE 10 MG PO TABS
10.0000 mg | ORAL_TABLET | Freq: Once | ORAL | Status: AC
  Administered 2022-08-11: 10 mg via ORAL
  Filled 2022-08-11: qty 1

## 2022-08-11 MED ORDER — SODIUM CHLORIDE 0.9 % IV SOLN
200.0000 mg | Freq: Once | INTRAVENOUS | Status: AC
  Administered 2022-08-11: 200 mg via INTRAVENOUS
  Filled 2022-08-11: qty 200

## 2022-08-11 MED ORDER — SODIUM CHLORIDE 0.9 % IV SOLN: Freq: Once | INTRAVENOUS | Status: AC

## 2022-08-11 MED ORDER — SODIUM CHLORIDE 0.9 % IV SOLN
400.0000 mg/m2 | Freq: Once | INTRAVENOUS | Status: AC
  Administered 2022-08-11: 900 mg via INTRAVENOUS
  Filled 2022-08-11: qty 20

## 2022-08-11 NOTE — Patient Instructions (Signed)
[facility]  Discharge Instructions: Thank you for choosing [facility] to provide your oncology and hematology care.   If you have a lab appointment with the Cancer Center, please go directly to the Cancer Center and check in at the registration area.   Wear comfortable clothing and clothing appropriate for easy access to any Portacath or PICC line.   We strive to give you quality time with your provider. You may need to reschedule your appointment if you arrive late (15 or more minutes).  Arriving late affects you and other patients whose appointments are after yours.  Also, if you miss three or more appointments without notifying the office, you may be dismissed from the clinic at the provider's discretion.      For prescription refill requests, have your pharmacy contact our office and allow 72 hours for refills to be completed.    Today you received the following chemotherapy and/or immunotherapy agents: Keytruda/Alimta      To help prevent nausea and vomiting after your treatment, we encourage you to take your nausea medication as directed.  BELOW ARE SYMPTOMS THAT SHOULD BE REPORTED IMMEDIATELY: *FEVER GREATER THAN 100.4 F (38 C) OR HIGHER *CHILLS OR SWEATING *NAUSEA AND VOMITING THAT IS NOT CONTROLLED WITH YOUR NAUSEA MEDICATION *UNUSUAL SHORTNESS OF BREATH *UNUSUAL BRUISING OR BLEEDING *URINARY PROBLEMS (pain or burning when urinating, or frequent urination) *BOWEL PROBLEMS (unusual diarrhea, constipation, pain near the anus) TENDERNESS IN MOUTH AND THROAT WITH OR WITHOUT PRESENCE OF ULCERS (sore throat, sores in mouth, or a toothache) UNUSUAL RASH, SWELLING OR PAIN  UNUSUAL VAGINAL DISCHARGE OR ITCHING   Items with * indicate a potential emergency and should be followed up as soon as possible or go to the Emergency Department if any problems should occur.  Please show the CHEMOTHERAPY ALERT CARD or IMMUNOTHERAPY ALERT CARD at  check-in to the Emergency Department and triage nurse.  Should you have questions after your visit or need to cancel or reschedule your appointment, please contact [facility]  Dept: [phone]  and follow the prompts.  Office hours are 8:00 a.m. to 4:30 p.m. Monday - Friday. Please note that voicemails left after 4:00 p.m. may not be returned until the following business day.  We are closed weekends and major holidays. You have access to a nurse at all times for urgent questions. Please call the main number to the clinic Dept: [phone] and follow the prompts.   For any non-urgent questions, you may also contact your provider using MyChart. We now offer e-Visits for anyone 18 and older to request care online for non-urgent symptoms. For details visit [email or web address].   Also download the MyChart app! Go to the app store, search "MyChart", open the app, select [facility], and log in with your MyChart username and password.   

## 2022-08-19 ENCOUNTER — Other Ambulatory Visit: Payer: Self-pay

## 2022-08-21 ENCOUNTER — Other Ambulatory Visit: Payer: Self-pay | Admitting: Internal Medicine

## 2022-08-25 ENCOUNTER — Encounter: Payer: Self-pay | Admitting: Internal Medicine

## 2022-08-25 ENCOUNTER — Ambulatory Visit (INDEPENDENT_AMBULATORY_CARE_PROVIDER_SITE_OTHER): Payer: Medicare Other | Admitting: Internal Medicine

## 2022-08-25 VITALS — BP 118/80 | HR 91 | Temp 98.5°F | Ht 72.0 in | Wt 188.0 lb

## 2022-08-25 DIAGNOSIS — E785 Hyperlipidemia, unspecified: Secondary | ICD-10-CM

## 2022-08-25 DIAGNOSIS — D649 Anemia, unspecified: Secondary | ICD-10-CM

## 2022-08-25 DIAGNOSIS — C3491 Malignant neoplasm of unspecified part of right bronchus or lung: Secondary | ICD-10-CM

## 2022-08-25 DIAGNOSIS — E118 Type 2 diabetes mellitus with unspecified complications: Secondary | ICD-10-CM | POA: Diagnosis not present

## 2022-08-25 DIAGNOSIS — E2749 Other adrenocortical insufficiency: Secondary | ICD-10-CM | POA: Diagnosis not present

## 2022-08-25 DIAGNOSIS — Z7984 Long term (current) use of oral hypoglycemic drugs: Secondary | ICD-10-CM

## 2022-08-25 DIAGNOSIS — J410 Simple chronic bronchitis: Secondary | ICD-10-CM | POA: Diagnosis not present

## 2022-08-25 DIAGNOSIS — E1169 Type 2 diabetes mellitus with other specified complication: Secondary | ICD-10-CM | POA: Diagnosis not present

## 2022-08-25 MED ORDER — HYDROCODONE-ACETAMINOPHEN 10-325 MG PO TABS: 1.0000 | ORAL_TABLET | Freq: Two times a day (BID) | ORAL | 0 refills | Status: DC | PRN

## 2022-08-25 NOTE — Assessment & Plan Note (Signed)
Last lipid panel 05/2021 under good control given current cancer are forgoing unnecessary labs. Continue simvastatin 40 mg daily.

## 2022-08-25 NOTE — Progress Notes (Signed)
   Subjective:   Patient ID: Brandon Rubio., male    DOB: Mar 09, 1951, 72 y.o.   MRN: [id number]  HPI The patient is a 72 YO man coming in for chronic pain in the setting of lung cancer. Change in chemo recently and scan upcoming Friday.   Review of Systems  Constitutional:  Positive for activity change. Negative for appetite change, fatigue, fever and unexpected weight change.  Respiratory:  Positive for shortness of breath.   Cardiovascular: Negative.   Musculoskeletal:  Positive for back pain and myalgias. Negative for arthralgias.  Skin: Negative.   Neurological:  Negative for syncope, weakness and numbness.    Objective:  Physical Exam Constitutional:      Appearance: He is well-developed.  HENT:     Head: Normocephalic and atraumatic.  Cardiovascular:     Rate and Rhythm: Normal rate and regular rhythm.  Pulmonary:     Effort: Pulmonary effort is normal. No respiratory distress.     Breath sounds: Normal breath sounds. No wheezing or rales.  Abdominal:     General: Bowel sounds are normal. There is no distension.     Palpations: Abdomen is soft.     Tenderness: There is no abdominal tenderness. There is no rebound.  Musculoskeletal:        General: Tenderness present.     Cervical back: Normal range of motion.  Skin:    General: Skin is warm and dry.  Neurological:     Mental Status: He is alert and oriented to person, place, and time.     Coordination: Coordination normal.     Vitals:   08/25/22 1030  BP: 118/80  Pulse: 91  Temp: 98.5 F (36.9 C)  TempSrc: Oral  SpO2: 97%  Weight: 188 lb (85.3 kg)  Height: 6' (1.829 m)    Assessment & Plan:  Visit time 20 minutes in face to face communication with patient and coordination of care, additional 10 minutes spent in record review, coordination or care, ordering tests, communicating/referring to other healthcare professionals, documenting in medical records all on the same day of the visit for total time 30  minutes spent on the visit.

## 2022-08-25 NOTE — Assessment & Plan Note (Signed)
Change in chemo recently due to progressive lesions. He is due for scan on Friday. Will review results. Chronic cancer pain and taking hydrodocone 5/325 up to bid and pain is not being relieved as well. Will increase dose to 10/325 keep bid for pain hydrocodone.

## 2022-08-25 NOTE — Assessment & Plan Note (Signed)
Last HgA1c 6.8 and is on chronic prednisone for adrenal insufficiency. Is taking metformin 1000 mg BID and will continue. Is on statin. We have elected to forgo yearly microalbumin to creatinine and lipid panel given ongoing active lung cancer diagnosis.

## 2022-08-25 NOTE — Assessment & Plan Note (Signed)
Current smoker and struggling with lung cancer currently. Reminded he can try to quit.

## 2022-08-25 NOTE — Patient Instructions (Signed)
We have refilled the hydrocodone today and increased the dose some.

## 2022-08-25 NOTE — Assessment & Plan Note (Signed)
Stable and seeing endo for dosing of prednisone.

## 2022-08-25 NOTE — Assessment & Plan Note (Signed)
Recent Hg stable at 10. No clinical signs of bleeding.

## 2022-08-29 ENCOUNTER — Encounter (HOSPITAL_COMMUNITY): Payer: Self-pay

## 2022-08-29 ENCOUNTER — Ambulatory Visit (HOSPITAL_COMMUNITY)
Admission: RE | Admit: 2022-08-29 | Discharge: 2022-08-29 | Disposition: A | Discharge status: Home / Self Care | Payer: Medicare Other | Source: Ambulatory Visit | Attending: Physician Assistant | Admitting: Physician Assistant

## 2022-08-29 DIAGNOSIS — C7800 Secondary malignant neoplasm of unspecified lung: Secondary | ICD-10-CM

## 2022-08-29 DIAGNOSIS — R918 Other nonspecific abnormal finding of lung field: Secondary | ICD-10-CM | POA: Diagnosis not present

## 2022-08-29 DIAGNOSIS — C3490 Malignant neoplasm of unspecified part of unspecified bronchus or lung: Secondary | ICD-10-CM | POA: Diagnosis not present

## 2022-08-29 DIAGNOSIS — J432 Centrilobular emphysema: Secondary | ICD-10-CM | POA: Diagnosis not present

## 2022-08-29 MED ORDER — SODIUM CHLORIDE (PF) 0.9 % IJ SOLN
INTRAMUSCULAR | Status: AC
  Filled 2022-08-29: qty 50

## 2022-08-29 MED ORDER — IOHEXOL 300 MG/ML  SOLN
75.0000 mL | Freq: Once | INTRAMUSCULAR | Status: AC | PRN
  Administered 2022-08-29: 75 mL via INTRAVENOUS

## 2022-09-01 ENCOUNTER — Inpatient Hospital Stay (HOSPITAL_BASED_OUTPATIENT_CLINIC_OR_DEPARTMENT_OTHER): Payer: Medicare Other | Admitting: Internal Medicine

## 2022-09-01 ENCOUNTER — Inpatient Hospital Stay: Payer: Medicare Other

## 2022-09-01 ENCOUNTER — Other Ambulatory Visit: Payer: Self-pay

## 2022-09-01 VITALS — BP 120/65 | HR 100 | Resp 16

## 2022-09-01 VITALS — BP 113/71 | HR 108 | Temp 98.4°F | Resp 18 | Ht 72.0 in | Wt 190.7 lb

## 2022-09-01 DIAGNOSIS — C7800 Secondary malignant neoplasm of unspecified lung: Secondary | ICD-10-CM | POA: Diagnosis not present

## 2022-09-01 DIAGNOSIS — C3491 Malignant neoplasm of unspecified part of right bronchus or lung: Secondary | ICD-10-CM

## 2022-09-01 DIAGNOSIS — T451X5A Adverse effect of antineoplastic and immunosuppressive drugs, initial encounter: Secondary | ICD-10-CM | POA: Diagnosis not present

## 2022-09-01 DIAGNOSIS — R112 Nausea with vomiting, unspecified: Secondary | ICD-10-CM

## 2022-09-01 DIAGNOSIS — Z79899 Other long term (current) drug therapy: Secondary | ICD-10-CM | POA: Diagnosis not present

## 2022-09-01 DIAGNOSIS — C3431 Malignant neoplasm of lower lobe, right bronchus or lung: Secondary | ICD-10-CM | POA: Diagnosis not present

## 2022-09-01 DIAGNOSIS — E2740 Unspecified adrenocortical insufficiency: Secondary | ICD-10-CM | POA: Diagnosis not present

## 2022-09-01 DIAGNOSIS — Z5111 Encounter for antineoplastic chemotherapy: Secondary | ICD-10-CM | POA: Diagnosis not present

## 2022-09-01 LAB — CBC WITH DIFFERENTIAL (CANCER CENTER ONLY)
Abs Immature Granulocytes: 0.05 10*3/uL (ref 0.00–0.07)
Basophils Absolute: 0.1 10*3/uL (ref 0.0–0.1)
Basophils Relative: 1 %
Eosinophils Absolute: 0.2 10*3/uL (ref 0.0–0.5)
Eosinophils Relative: 2 %
HCT: 31.2 % — ABNORMAL LOW (ref 39.0–52.0)
Hemoglobin: 10.7 g/dL — ABNORMAL LOW (ref 13.0–17.0)
Immature Granulocytes: 1 %
Lymphocytes Relative: 9 %
Lymphs Abs: 1 10*3/uL (ref 0.7–4.0)
MCH: 33.8 pg (ref 26.0–34.0)
MCHC: 34.3 g/dL (ref 30.0–36.0)
MCV: 98.4 fL (ref 80.0–100.0)
Monocytes Absolute: 0.9 10*3/uL (ref 0.1–1.0)
Monocytes Relative: 8 %
Neutro Abs: 8.2 10*3/uL — ABNORMAL HIGH (ref 1.7–7.7)
Neutrophils Relative %: 79 %
Platelet Count: 179 10*3/uL (ref 150–400)
RBC: 3.17 MIL/uL — ABNORMAL LOW (ref 4.22–5.81)
RDW: 15.1 % (ref 11.5–15.5)
WBC Count: 10.3 10*3/uL (ref 4.0–10.5)
nRBC: 0 % (ref 0.0–0.2)

## 2022-09-01 LAB — CMP (CANCER CENTER ONLY)
ALT: 24 U/L (ref 0–44)
AST: 27 U/L (ref 15–41)
Albumin: 3.7 g/dL (ref 3.5–5.0)
Alkaline Phosphatase: 50 U/L (ref 38–126)
Anion gap: 10 (ref 5–15)
BUN: 9 mg/dL (ref 8–23)
CO2: 23 mmol/L (ref 22–32)
Calcium: 8.7 mg/dL — ABNORMAL LOW (ref 8.9–10.3)
Chloride: 104 mmol/L (ref 98–111)
Creatinine: 0.93 mg/dL (ref 0.61–1.24)
GFR, Estimated: >60 mL/min (ref >60)
Glucose, Bld: 148 mg/dL — ABNORMAL HIGH (ref 70–99)
Potassium: 4 mmol/L (ref 3.5–5.1)
Sodium: 137 mmol/L (ref 135–145)
Total Bilirubin: 0.4 mg/dL (ref 0.3–1.2)
Total Protein: 6.5 g/dL (ref 6.5–8.1)

## 2022-09-01 LAB — TSH: TSH: 2.42 u[IU]/mL (ref 0.350–4.500)

## 2022-09-01 MED ORDER — PROCHLORPERAZINE MALEATE 10 MG PO TABS
10.0000 mg | ORAL_TABLET | Freq: Four times a day (QID) | ORAL | 1 refills | Status: DC | PRN
Start: 2022-09-01 — End: 2022-10-10

## 2022-09-01 MED ORDER — SODIUM CHLORIDE 0.9 % IV SOLN: Freq: Once | INTRAVENOUS | Status: AC

## 2022-09-01 MED ORDER — CYANOCOBALAMIN 1000 MCG/ML IJ SOLN
1000.0000 ug | Freq: Once | INTRAMUSCULAR | Status: AC
  Administered 2022-09-01: 1000 ug via INTRAMUSCULAR
  Filled 2022-09-01: qty 1

## 2022-09-01 MED ORDER — SODIUM CHLORIDE 0.9 % IV SOLN
200.0000 mg | Freq: Once | INTRAVENOUS | Status: AC
  Administered 2022-09-01: 200 mg via INTRAVENOUS
  Filled 2022-09-01: qty 100

## 2022-09-01 MED ORDER — PROCHLORPERAZINE MALEATE 10 MG PO TABS
10.0000 mg | ORAL_TABLET | Freq: Once | ORAL | Status: AC
  Administered 2022-09-01: 10 mg via ORAL
  Filled 2022-09-01: qty 1

## 2022-09-01 MED ORDER — SODIUM CHLORIDE 0.9 % IV SOLN
400.0000 mg/m2 | Freq: Once | INTRAVENOUS | Status: AC
  Administered 2022-09-01: 900 mg via INTRAVENOUS
  Filled 2022-09-01: qty 20

## 2022-09-01 NOTE — Progress Notes (Signed)
[facility] [phone]   [phone]  OFFICE PROGRESS NOTE  [name], MD [address] [place] [address]  DIAGNOSIS: Stage IIIA (T4, N0, M0) non-small cell lung cancer, favoring adenocarcinoma presented with large right lower lobe lung mass with no mediastinal lymphadenopathy or extrathoracic metastasis diagnosed in October 2022. Had disease progression with enlarging mass and new right hilar and mediastinal lymph nodes in December 2022.      PRIOR THERAPY:  1) Neoadjuvant treatment with carboplatin for AUC of 5, Alimta 500 Mg/M2 and nivolumab 360 Mg IV every 3 weeks.  First dose January 23, 2021.  Status post 3 cycles. 2) Concurrent chemoradiation with carboplatin for an AUC of 2 and paclitaxel 45 mg per metered squared.  First dose expected on 04/01/2021.  Status post 4 cycles 3) Consolidation treatment with immunotherapy with Imfinzi 1500 Mg IV every 4 weeks.  First dose on Jul 08, 2021.  Status post 9 cycles.    CURRENT THERAPY: Palliative systemic chemotherapy with carboplatin for an AUC of 5, Alimta 500 mg/m, Keytruda 200 mg IV every 3 weeks. First dose on 05/13/2022.  Status post 5 cycles.  Starting him cycle #3 his dose of carboplatin will be reduced to AUC of 4 and Alimta 400 Mg/M2 secondary to intolerance.  INTERVAL HISTORY: Brandon Rubio. 72 y.o. male returns to the clinic today for follow-up visit.  The patient is feeling fine today with no concerning complaints except for intermittent nausea and he is requesting refill of Compazine.  He denied having any current chest pain but has shortness of breath with exertion with no cough or hemoptysis.  He has no nausea, vomiting, diarrhea or constipation.  He has no headache or visual changes.  He has no recent weight loss or night sweats.  He has been tolerating his maintenance treatment with Alimta and Keytruda fairly well.  He had repeat CT scan of the chest performed  recently and is here for evaluation and discussion of his scan results before starting cycle #6.  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  MEDICATIONS:  Current Outpatient Medications  Medication Sig Dispense Refill   aspirin EC 81 MG tablet Take 81 mg by mouth in the morning. Swallow whole.     bisacodyl (DULCOLAX) 5 MG EC tablet Take 5 mg by mouth daily as needed for moderate constipation.     folic acid (FOLVITE) 1 MG tablet Take 1 tablet (1 mg total) by mouth daily. 30 tablet 2   HYDROcodone-acetaminophen (NORCO) 10-325 MG tablet Take 1 tablet by mouth 2 (two) times daily as needed. 60 tablet 0   metFORMIN (GLUCOPHAGE) 1000 MG tablet TAKE 1 TABLET(1000 MG) BY MOUTH TWICE DAILY WITH A MEAL 180 tablet 1   Multiple Vitamins-Minerals (CENTRUM SILVER 50+MEN) TABS Take 1 tablet by mouth daily with breakfast.     omeprazole (PRILOSEC) 20 MG capsule TAKE 1 CAPSULE BY MOUTH TWICE DAILY BEFORE  A MEAL 180 capsule 1   ondansetron (ZOFRAN) 8 MG tablet Take 1 tablet (8 mg total) by mouth every 8 (eight) hours as needed for nausea or vomiting. 30 tablet 2   predniSONE (DELTASONE) 5 MG tablet Take 1.5 tablets (7.5 mg total) by mouth daily with breakfast. 135 tablet 2   prochlorperazine (COMPAZINE) 10 MG tablet Take 1 tablet (10 mg total) by mouth every 6 (six) hours as needed for up to 14 days for nausea or vomiting. 60 tablet 0   simvastatin (ZOCOR) 40 MG tablet TAKE 1 TABLET(40 MG) BY MOUTH DAILY AT 6 PM 90 tablet 3   No current facility-administered  medications for this visit.    SURGICAL HISTORY:  Past Surgical History:  Procedure Laterality Date   BRONCHIAL BIOPSY  12/10/2020   Procedure: BRONCHIAL BIOPSIES;  Surgeon: Leslye Peer, MD;  Location: [facility] ENDOSCOPY;  Service: Pulmonary;;   BRONCHIAL BRUSHINGS  12/10/2020   Procedure: BRONCHIAL BRUSHINGS;  Surgeon: Leslye Peer, MD;  Location[facility] END[facility]COPY;  Service: Pulmonary;;   BRONCHIAL NEEDLE ASPIRATION BIOPSY  12/10/2020   Procedure: BRONCHIAL NEEDLE ASPIRATION BIOPSIES;  Surgeon: Leslye Peer, MD;  Location: [facility] ENDOSCOPY;  Service: Pulmonary;;   COLONOSCOPY     COLONOSCOPY W/ POLYPECTOMY  03/10/2009   LARYNGOSCOPY  03/10/1997   VIDEO BRONCHOSCOPY WITH ENDOBRONCHIAL NAVIGATION N/A 12/10/2020   Procedure: ROBOTIC VIDEO BRONCHOSCOPY WITH ENDOBRONCHIAL NAVIGATION;  Surgeon: Leslye Peer, MD;  Location: [facility] ENDOSCOPY;  Service: Pulmonary;  Laterality: N/A;    REVIEW OF SYSTEMS:  Constitutional: positive for fatigue and weight loss Eyes: negative Ears, nose, mouth, throat, and face: negative Respiratory: positive for dyspnea on exertion Cardiovascular: negative Gastrointestinal: positive for nausea Genitourinary:negative Integument/breast: negative Hematologic/lymphatic: negative Musculoskeletal:negative Neurological: negative Behavioral/Psych: negative Endocrine: negative Allergic/Immunologic: negative   PHYSICAL EXAMINATION: General appearance: alert, cooperative, fatigued, and no distress Head: Normocephalic, without obvious abnormality, atraumatic Neck: no adenopathy, no JVD, supple, symmetrical, trachea midline, and thyroid not enlarged, symmetric, no tenderness/mass/nodules Lymph nodes: Cervical, supraclavicular, and axillary nodes normal. Resp: clear to auscultation bilaterally Back: symmetric, no curvature. ROM normal. No CVA tenderness. Cardio: regular rate and rhythm, S1, S2 normal, no murmur, click, rub or gallop GI: soft, non-tender; bowel sounds  normal; no masses,  no organomegaly Extremities: extremities normal, atraumatic, no cyanosis or edema Neurologic: Alert and oriented X 3, normal strength and tone. Normal symmetric reflexes. Normal coordination and gait  ECOG PERFORMANCE STATUS: 1 - Symptomatic but completely ambulatory  Blood pressure 113/71, pulse (!) 108, temperature 98.4 F (36.9 C), temperature source Oral, resp. rate 18, height 6' (1.829 m), weight 190 lb 11.2 oz (86.5 kg), SpO2 97 %.  LABORATORY DATA: Lab Results  Component Value Date   WBC 10.3 09/01/2022   HGB 10.7 (L) 09/01/2022   HCT 31.2 (L) 09/01/2022   MCV 98.4 09/01/2022   PLT 179 09/01/2022      Chemistry      Component Value Date/Time   NA 137 08/11/2022 1016   K 3.8 08/11/2022 1016   CL 103 08/11/2022 1016   CO2 22 08/11/2022 1016   BUN 16 08/11/2022 1016   CREATININE 1.01 08/11/2022 1016      Component Value Date/Time   CALCIUM 8.9 08/11/2022 1016   ALKPHOS 55 08/11/2022 1016   AST 22 08/11/2022 1016   ALT 24 08/11/2022 1016   BILITOT 0.4 08/11/2022 1016       RADIOGRAPHIC STUDIES: CT Chest W Contrast  Result Date: 09/01/2022 CLINICAL DATA:  Non-small cell  lung cancer; * Tracking Code: BO * EXAM: CT CHEST WITH CONTRAST TECHNIQUE: Multidetector CT imaging of the chest was performed during intravenous contrast administration. RADIATION DOSE REDUCTION: This exam was performed according to the departmental dose-optimization program which includes automated exposure control, adjustment of the mA and/or kV according to patient size and/or use of iterative reconstruction technique. CONTRAST:  75mL OMNIPAQUE IOHEXOL 300 MG/ML  SOLN COMPARISON:  CT chest, abdomen and pelvis dated Jul 18, 2022 FINDINGS: Cardiovascular: Normal heart size. Trace pericardial effusion. Normal caliber thoracic aorta with moderate atherosclerotic disease. Severe coronary artery calcifications. Mediastinum/Nodes: Esophagus and thyroid are unremarkable. No enlarged lymph  nodes seen in the chest. Lungs/Pleura: Central airways are patent. Moderate centrilobular emphysema. Stable postradiation change of the right lower lobe. Numerous bilateral solid and subsolid nodules are stable when compared with most recent prior exam, but have increased in size when compared with more remote priors. Reference irregular solid right middle lobe nodule measuring 12 x 8 mm on series 6, image 77, unchanged. Reference left upper lobe part solid nodule measuring 17 x 16 mm with 12 mm solid component, unchanged. Upper Abdomen: No acute abnormality. Musculoskeletal: No chest wall abnormality. No acute or significant osseous findings. IMPRESSION: 1. Stable right lower lobe postradiation change. 2. Numerous bilateral solid and subsolid nodules are stable when compared with most recent prior exam, but have increased in size when compared with more remote priors, consistent with metastatic disease. 3. Aortic Atherosclerosis (ICD10-I70.0) and Emphysema (ICD10-J43.9). Electronically Signed   By: Allegra Lai M.D.   On: 09/01/2022 10:00    ASSESSMENT AND PLAN: This is a very pleasant 72 years old white male with history of a stage IIIA (T4, N1, M0) non-small cell lung cancer, adenocarcinoma presented with right lower lobe lung mass with a right hilar and no mediastinal lymphadenopathy and no evidence of extrathoracic disease.  The patient was diagnosed in October 2022 status post 3 cycles of neoadjuvant systemic chemotherapy with carboplatin, Alimta and nivolumab but unfortunately has no evidence for significant improvement of his disease and actually has some evidence for progression. The patient underwent a course of concurrent chemoradiation weekly carboplatin for AUC of 2 and paclitaxel 45 Mg/M2.  Status post 4 cycles. The patient had a rough time with the course of concurrent chemoradiation and he was hospitalized several times during this course. Imaging studies after the course of concurrent  chemoradiation showed no concerning findings for disease progression. The patient underwent treatment with immunotherapy with Imfinzi 1500 Mg IV every 4 weeks status post 9 cycles.  This treatment was discontinued secondary to disease progression. The patient is currently on systemic chemotherapy with carboplatin, Alimta and Keytruda status post 5 cycles.  Starting from cycle #5 he has been on treatment with maintenance Alimta and Keytruda every 3 weeks. The patient tolerated the last cycle of his treatment much better after discontinuation of the carboplatin. He had repeat CT scan of the chest performed recently.  I personally and independently reviewed the scan and discussed the result with the patient today. His scan showed a stable disease compared to the previous study with no concerning findings for progression. I recommended for him to continue his current maintenance treatment with Alimta and Keytruda and he will proceed with cycle #6 today. For the nausea, I will give him refill of Compazine. I will see the patient back for follow-up visit in 3 weeks for evaluation before the next cycle of his treatment. He was advised to call immediately if he has  any other concerning symptoms in the interval.  The patient voices understanding of current disease status and treatment options and is in agreement with the current care plan.  All questions were answered. The patient knows to call the clinic with any problems, questions or concerns. We can certainly see the patient much sooner if necessary.   Disclaimer: This note was dictated with voice recognition software. Similar sounding words can inadvertently be transcribed and may not be corrected upon review.

## 2022-09-01 NOTE — Patient Instructions (Signed)
[facility]  Discharge Instructions: Thank you for choosing [facility] to provide your oncology and hematology care.   If you have a lab appointment with the Cancer Center, please go directly to the Cancer Center and check in at the registration area.   Wear comfortable clothing and clothing appropriate for easy access to any Portacath or PICC line.   We strive to give you quality time with your provider. You may need to reschedule your appointment if you arrive late (15 or more minutes).  Arriving late affects you and other patients whose appointments are after yours.  Also, if you miss three or more appointments without notifying the office, you may be dismissed from the clinic at the provider's discretion.      For prescription refill requests, have your pharmacy contact our office and allow 72 hours for refills to be completed.    Today you received the following chemotherapy and/or immunotherapy agents: Keytruda/Alimta      To help prevent nausea and vomiting after your treatment, we encourage you to take your nausea medication as directed.  BELOW ARE SYMPTOMS THAT SHOULD BE REPORTED IMMEDIATELY: *FEVER GREATER THAN 100.4 F (38 C) OR HIGHER *CHILLS OR SWEATING *NAUSEA AND VOMITING THAT IS NOT CONTROLLED WITH YOUR NAUSEA MEDICATION *UNUSUAL SHORTNESS OF BREATH *UNUSUAL BRUISING OR BLEEDING *URINARY PROBLEMS (pain or burning when urinating, or frequent urination) *BOWEL PROBLEMS (unusual diarrhea, constipation, pain near the anus) TENDERNESS IN MOUTH AND THROAT WITH OR WITHOUT PRESENCE OF ULCERS (sore throat, sores in mouth, or a toothache) UNUSUAL RASH, SWELLING OR PAIN  UNUSUAL VAGINAL DISCHARGE OR ITCHING   Items with * indicate a potential emergency and should be followed up as soon as possible or go to the Emergency Department if any problems should occur.  Please show the CHEMOTHERAPY ALERT CARD or IMMUNOTHERAPY ALERT CARD at  check-in to the Emergency Department and triage nurse.  Should you have questions after your visit or need to cancel or reschedule your appointment, please contact [place] [facility]  Dept: [phone]  and follow the prompts.  Office hours are 8:00 a.m. to 4:30 p.m. Monday - Friday. Please note that voicemails left after 4:00 p.m. may not be returned until the following business day.  We are closed weekends and major holidays. You have access to a nurse at all times for urgent questions. Please call the main number to the clinic Dept: [phone] and follow the prompts.   For any non-urgent questions, you may also contact your provider using MyChart. We now offer e-Visits for anyone 18 and older to request care online for non-urgent symptoms. For details visit [email or web address].   Also download the MyChart app! Go to the app store, search "MyChart", open the app, select , and log in with your MyChart username and password.   

## 2022-09-02 ENCOUNTER — Telehealth: Payer: Self-pay | Admitting: Internal Medicine

## 2022-09-02 LAB — T4: T4, Total: 9.2 ug/dL (ref 4.5–12.0)

## 2022-09-02 NOTE — Telephone Encounter (Signed)
Called patient regarding July and August appointments, patient is notified.  

## 2022-09-06 ENCOUNTER — Other Ambulatory Visit: Payer: Self-pay

## 2022-09-08 ENCOUNTER — Other Ambulatory Visit: Payer: Medicare Other

## 2022-09-11 ENCOUNTER — Other Ambulatory Visit: Payer: Self-pay | Admitting: Internal Medicine

## 2022-09-15 ENCOUNTER — Other Ambulatory Visit: Payer: Medicare Other

## 2022-09-15 ENCOUNTER — Ambulatory Visit (INDEPENDENT_AMBULATORY_CARE_PROVIDER_SITE_OTHER): Payer: Medicare Other

## 2022-09-15 ENCOUNTER — Telehealth: Payer: Self-pay | Admitting: Internal Medicine

## 2022-09-15 VITALS — Ht 72.0 in | Wt 190.0 lb

## 2022-09-15 DIAGNOSIS — Z0000 Encounter for general adult medical examination without abnormal findings: Secondary | ICD-10-CM | POA: Diagnosis not present

## 2022-09-15 NOTE — Patient Instructions (Signed)
Brandon Rubio , Thank you for taking time to come for your Medicare Wellness Visit. I appreciate your ongoing commitment to your health goals. Please review the following plan we discussed and let me know if I can assist you in the future.   These are the goals we discussed:  Goals      Client understands the importance of follow-up with providers by attending scheduled visits     Monitor and Manage My Blood Sugar-Diabetes Type 2     Timeframe:  Long-Range Goal Priority:  High Start Date:        07/19/20                     Expected End Date:    02/06/22                  Follow Up Date 10/2021   -Continue metformin as prescribed -Reduce sugar and carbs in diet -Increase exercise to goal of 150 min/week   Why is this important?   Checking your blood sugar at home helps to keep it from getting very high or very low.  Writing the results in a diary or log helps the doctor know how to care for you.  Your blood sugar log should have the time, date and the results.  Also, write down the amount of insulin or other medicine that you take.  Other information, like what you ate, exercise done and how you were feeling, will also be helpful.     Notes:      Patient Stated     I want to increase my physical by walking more and playing golf.     Stop or Cut Down Tobacco Use     Timeframe:  Long-Range Goal Priority:  High Start Date:     12/28/20                        Expected End Date:    12/28/21                   Follow Up Date Jan 2023   - change or avoid triggers like smoky places, drinking alcohol and other smokers - cut down number of cigarettes by one-half - use over-the-counter gum, patch or lozenges - use Quit Line 1-800-Quit Now    Why is this important?   To stop or cut down it is important to have support from a person or group of people who you can count on.  You will also need to think about the things that make you feel like smoking, then plan for how to handle them.     Notes:         This is a list of the screening recommended for you and due dates:  Health Maintenance  Topic Date Due   Zoster (Shingles) Vaccine (1 of 2) Never done   Pneumonia Vaccine (2 of 2 - PPSV23 or PCV20) 10/28/2017   Yearly kidney health urinalysis for diabetes  06/14/2021   COVID-19 Vaccine (4 - 2023-24 season) 11/08/2021   Complete foot exam   08/31/2022   Hemoglobin A1C  09/22/2022   Flu Shot  10/09/2022   Eye exam for diabetics  05/12/2023   Yearly kidney function blood test for diabetes  09/01/2023   Medicare Annual Wellness Visit  09/15/2023   DTaP/Tdap/Td vaccine (2 - Td or Tdap) 11/19/2027   Colon Cancer Screening  01/01/2028   Hepatitis C  Screening  Completed   HPV Vaccine  Aged Out    Advanced directives: Advance directive discussed with you today. Even though you declined this today, please call our office should you change your mind, and we can give you the proper paperwork for you to fill out.   Conditions/risks identified: Each day, aim for 6 glasses of water, plenty of protein in your diet and try to get up and walk/ stretch every hour for 5-10 minutes at a time.    Next appointment: Follow up in one year for your annual wellness visit.   Preventive Care 42 Years and Older, Male  Preventive care refers to lifestyle choices and visits with your health care provider that can promote health and wellness. What does preventive care include? A yearly physical exam. This is also called an annual well check. Dental exams once or twice a year. Routine eye exams. Ask your health care provider how often you should have your eyes checked. Personal lifestyle choices, including: Daily care of your teeth and gums. Regular physical activity. Eating a healthy diet. Avoiding tobacco and drug use. Limiting alcohol use. Practicing safe sex. Taking low doses of aspirin every day. Taking vitamin and mineral supplements as recommended by your health care  provider. What happens during an annual well check? The services and screenings done by your health care provider during your annual well check will depend on your age, overall health, lifestyle risk factors, and family history of disease. Counseling  Your health care provider may ask you questions about your: Alcohol use. Tobacco use. Drug use. Emotional well-being. Home and relationship well-being. Sexual activity. Eating habits. History of falls. Memory and ability to understand (cognition). Work and work Astronomer. Screening  You may have the following tests or measurements: Height, weight, and BMI. Blood pressure. Lipid and cholesterol levels. These may be checked every 5 years, or more frequently if you are over 51 years old. Skin check. Lung cancer screening. You may have this screening every year starting at age 50 if you have a 30-pack-year history of smoking and currently smoke or have quit within the past 15 years. Fecal occult blood test (FOBT) of the stool. You may have this test every year starting at age 20. Flexible sigmoidoscopy or colonoscopy. You may have a sigmoidoscopy every 5 years or a colonoscopy every 10 years starting at age 61. Prostate cancer screening. Recommendations will vary depending on your family history and other risks. Hepatitis C blood test. Hepatitis B blood test. Sexually transmitted disease (STD) testing. Diabetes screening. This is done by checking your blood sugar (glucose) after you have not eaten for a while (fasting). You may have this done every 1-3 years. Abdominal aortic aneurysm (AAA) screening. You may need this if you are a current or former smoker. Osteoporosis. You may be screened starting at age 67 if you are at high risk. Talk with your health care provider about your test results, treatment options, and if necessary, the need for more tests. Vaccines  Your health care provider may recommend certain vaccines, such  as: Influenza vaccine. This is recommended every year. Tetanus, diphtheria, and acellular pertussis (Tdap, Td) vaccine. You may need a Td booster every 10 years. Zoster vaccine. You may need this after age 61. Pneumococcal 13-valent conjugate (PCV13) vaccine. One dose is recommended after age 25. Pneumococcal polysaccharide (PPSV23) vaccine. One dose is recommended after age 28. Talk to your health care provider about which screenings and vaccines you need and how often you  need them. This information is not intended to replace advice given to you by your health care provider. Make sure you discuss any questions you have with your health care provider. Document Released: 03/23/2015 Document Revised: 11/14/2015 Document Reviewed: 12/26/2014 Elsevier Interactive Patient Education  2017 [employer].  Fall Prevention in the Home Falls can cause injuries. They can happen to people of all ages. There are many things you can do to make your home safe and to help prevent falls. What can I do on the outside of my home? Regularly fix the edges of walkways and driveways and fix any cracks. Remove anything that might make you trip as you walk through a door, such as a raised step or threshold. Trim any bushes or trees on the path to your home. Use bright outdoor lighting. Clear any walking paths of anything that might make someone trip, such as rocks or tools. Regularly check to see if handrails are loose or broken. Make sure that both sides of any steps have handrails. Any raised decks and porches should have guardrails on the edges. Have any leaves, snow, or ice cleared regularly. Use sand or salt on walking paths during winter. Clean up any spills in your garage right away. This includes oil or grease spills. What can I do in the bathroom? Use night lights. Install grab bars by the toilet and in the tub and shower. Do not use towel bars as grab bars. Use non-skid mats or decals in the tub or  shower. If you need to sit down in the shower, use a plastic, non-slip stool. Keep the floor dry. Clean up any water that spills on the floor as soon as it happens. Remove soap buildup in the tub or shower regularly. Attach bath mats securely with double-sided non-slip rug tape. Do not have throw rugs and other things on the floor that can make you trip. What can I do in the bedroom? Use night lights. Make sure that you have a light by your bed that is easy to reach. Do not use any sheets or blankets that are too big for your bed. They should not hang down onto the floor. Have a firm chair that has side arms. You can use this for support while you get dressed. Do not have throw rugs and other things on the floor that can make you trip. What can I do in the kitchen? Clean up any spills right away. Avoid walking on wet floors. Keep items that you use a lot in easy-to-reach places. If you need to reach something above you, use a strong step stool that has a grab bar. Keep electrical cords out of the way. Do not use floor polish or wax that makes floors slippery. If you must use wax, use non-skid floor wax. Do not have throw rugs and other things on the floor that can make you trip. What can I do with my stairs? Do not leave any items on the stairs. Make sure that there are handrails on both sides of the stairs and use them. Fix handrails that are broken or loose. Make sure that handrails are as long as the stairways. Check any carpeting to make sure that it is firmly attached to the stairs. Fix any carpet that is loose or worn. Avoid having throw rugs at the top or bottom of the stairs. If you do have throw rugs, attach them to the floor with carpet tape. Make sure that you have a light switch at  the top of the stairs and the bottom of the stairs. If you do not have them, ask someone to add them for you. What else can I do to help prevent falls? Wear shoes that: Do not have high heels. Have  rubber bottoms. Are comfortable and fit you well. Are closed at the toe. Do not wear sandals. If you use a stepladder: Make sure that it is fully opened. Do not climb a closed stepladder. Make sure that both sides of the stepladder are locked into place. Ask someone to hold it for you, if possible. Clearly mark and make sure that you can see: Any grab bars or handrails. First and last steps. Where the edge of each step is. Use tools that help you move around (mobility aids) if they are needed. These include: Canes. Walkers. Scooters. Crutches. Turn on the lights when you go into a dark area. Replace any light bulbs as soon as they burn out. Set up your furniture so you have a clear path. Avoid moving your furniture around. If any of your floors are uneven, fix them. If there are any pets around you, be aware of where they are. Review your medicines with your doctor. Some medicines can make you feel dizzy. This can increase your chance of falling. Ask your doctor what other things that you can do to help prevent falls. This information is not intended to replace advice given to you by your health care provider. Make sure you discuss any questions you have with your health care provider. Document Released: 12/21/2008 Document Revised: 08/02/2015 Document Reviewed: 03/31/2014 Elsevier Interactive Patient Education  2017 [employer].

## 2022-09-15 NOTE — Progress Notes (Signed)
Subjective:   Brandon Rubio. is a 72 y.o. male who presents for Medicare Annual/Subsequent preventive examination.  Visit Complete: Virtual  I connected with  Shirley Muscat. on 09/15/22 by a audio enabled telemedicine application and verified that I am speaking with the correct person using two identifiers.  Patient Location: Home  Provider Location: Home Office  I discussed the limitations of evaluation and management by telemedicine. The patient expressed understanding and agreed to proceed.  Review of Systems    Cardiac Risk Factors include: advanced age (>31men, >37 women);diabetes mellitus;dyslipidemia;hypertension;Other (see comment), Risk factor comments: CAD, Adenocarinoma, Rt lung     Objective:    Today's Vitals   09/15/22 1402  Weight: 190 lb (86.2 kg)  Height: 6' (1.829 m)   Body mass index is 25.77 kg/m.     09/15/2022    2:14 PM 08/11/2022   11:59 AM 03/18/2022   10:36 AM 02/18/2022    9:24 AM 02/18/2022    8:59 AM 12/24/2021   11:31 AM 10/29/2021   11:39 AM  Advanced Directives  Does Patient Have a Medical Advance Directive? No No No No No No No  Does patient want to make changes to medical advance directive?  No - Patient declined  No - Patient declined No - Patient declined    Would patient like information on creating a medical advance directive? No - Patient declined    No - Patient declined      Current Medications (verified) Outpatient Encounter Medications as of 09/15/2022  Medication Sig   aspirin EC 81 MG tablet Take 81 mg by mouth in the morning. Swallow whole.   bisacodyl (DULCOLAX) 5 MG EC tablet Take 5 mg by mouth daily as needed for moderate constipation.   folic acid (FOLVITE) 1 MG tablet Take 1 tablet (1 mg total) by mouth daily.   HYDROcodone-acetaminophen (NORCO) 10-325 MG tablet Take 1 tablet by mouth 2 (two) times daily as needed.   metFORMIN (GLUCOPHAGE) 1000 MG tablet TAKE 1 TABLET(1000 MG) BY MOUTH TWICE DAILY WITH A MEAL    Multiple Vitamins-Minerals (CENTRUM SILVER 50+MEN) TABS Take 1 tablet by mouth daily with breakfast.   omeprazole (PRILOSEC) 20 MG capsule TAKE 1 CAPSULE BY MOUTH TWICE DAILY BEFORE A MEAL   ondansetron (ZOFRAN) 8 MG tablet Take 1 tablet (8 mg total) by mouth every 8 (eight) hours as needed for nausea or vomiting.   predniSONE (DELTASONE) 5 MG tablet Take 1.5 tablets (7.5 mg total) by mouth daily with breakfast.   simvastatin (ZOCOR) 40 MG tablet TAKE 1 TABLET BY MOUTH EVERY EVENING AT 6 PM.   prochlorperazine (COMPAZINE) 10 MG tablet Take 1 tablet (10 mg total) by mouth every 6 (six) hours as needed for nausea or vomiting. (Patient not taking: Reported on 09/15/2022)   No facility-administered encounter medications on file as of 09/15/2022.    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   Peripheral neuropathy    toes   Past Surgical History:  Procedure Laterality Date   BRONCHIAL BIOPSY  12/10/2020   Procedure: BRONCHIAL BIOPSIES;  Surgeon: Leslye Peer, MD;  Location: [facility] ENDOSCOPY;  Service: Pulmonary;;   BRONCHIAL BRUSHINGS  12/10/2020   Procedure: BRONCHIAL BRUSHINGS;  Surgeon: Leslye Peer, MD;  Location: [facility] ENDOSCOPY;  Service: Pulmonary;;   BRONCHIAL NEEDLE ASPIRATION BIOPSY  12/10/2020   Procedure: BRONCHIAL NEEDLE ASPIRATION BIOPSIES;  Surgeon: Leslye Peer, MD;  Location: [facility] ENDOSCOPY;  Service: Pulmonary;;   COLONOSCOPY     COLONOSCOPY W/ POLYPECTOMY  03/10/2009   LARYNGOSCOPY  03/10/1997   VIDEO BRONCHOSCOPY WITH ENDOBRONCHIAL NAVIGATION N/A 12/10/2020   Procedure: ROBOTIC  VIDEO BRONCHOSCOPY WITH ENDOBRONCHIAL NAVIGATION;  Surgeon: Leslye Peer, MD;  Location: [facility] ENDOSCOPY;  Service: Pulmonary;  Laterality: N/A;   Family History  Problem Relation Age of Onset   Lung cancer Mother        X 2   Colon polyps Father    Colon cancer Father 45   Coronary artery disease Father    Lung disease Father    Esophageal cancer Neg Hx    Stomach cancer Neg Hx    Rectal cancer Neg Hx    Social History   Socioeconomic History   Marital status: Divorced    Spouse name: Not on file   Number of children: 0   Years of education: Not on file   Highest education level: Not on file  Occupational History   Occupation: Retired Personnel officer  Tobacco Use   Smoking status: Every Day    Packs/day: 1.50    Years: 58.00    Additional pack years: 0.00    Total pack years: 87.00    Types: Cigarettes   Smokeless tobacco: Never   Tobacco comments:    2+ packs smoked daily ARJ 12/05/20  Vaping Use   Vaping Use: Never used  Substance and Sexual Activity   Alcohol use: No    Alcohol/week: 0.0 standard drinks of alcohol    Comment:  none since 04/2012   Drug use: Never   Sexual activity: Not on file  Other Topics Concern   Not on file  Social History Narrative   REG EXERCISE         Social Determinants of Health   Financial Resource Strain: Low Risk  (09/15/2022)   Overall Financial Resource Strain (CARDIA)    Difficulty of Paying Living Expenses: Not hard at all  Food Insecurity: No Food Insecurity (09/15/2022)   Hunger Vital Sign    Worried About Running Out of Food in the Last Year: Never true    Ran Out of Food in the Last Year: Never true  Transportation Needs: No Transportation Needs (09/15/2022)   PRAPARE - Administrator, Civil Service (Medical): No    Lack of Transportation (Non-Medical): No  Physical Activity: Inactive (09/15/2022)   Exercise Vital Sign    Days of Exercise per Week: 0 days    Minutes of Exercise per Session: 0 min  Stress: No  Stress Concern Present (09/15/2022)   [employer] of Occupational Health - Occupational Stress Questionnaire    Feeling of Stress : Not at all  Social Connections: Socially Isolated (09/15/2022)   Social Connection and Isolation Panel [NHANES]    Frequency of Communication with Friends and Family: More than three times a week    Frequency of Social Gatherings with Friends and Family: Three times a week    Attends Religious Services: Never    Active Member of Clubs or Organizations: No    Attends Club or  Organization Meetings: Never    Marital Status: Divorced    Tobacco Counseling Ready to quit: Not Answered Counseling given: Not Answered Tobacco comments: 2+ packs smoked daily ARJ 12/05/20   Clinical Intake:  Pre-visit preparation completed: Yes  Pain : No/denies pain     BMI - recorded: 25.77 Nutritional Status: BMI 25 -29 Overweight Nutritional Risks: None Diabetes: Yes CBG done?: No Did pt. bring in CBG monitor from home?: No  How often do you need to have someone help you when you read instructions, pamphlets, or other written materials from your doctor or pharmacy?: 1 - Never  Interpreter Needed?: No  Information entered by :: Venancio Chenier, RMA   Activities of Daily Living    09/15/2022    2:06 PM  In your present state of health, do you have any difficulty performing the following activities:  Hearing? 0  Vision? 0  Difficulty concentrating or making decisions? 0  Walking or climbing stairs? 0  Dressing or bathing? 0  Doing errands, shopping? 0  Preparing Food and eating ? N  Using the Toilet? N  In the past six months, have you accidently leaked urine? N  Do you have problems with loss of bowel control? N  Managing your Medications? N  Managing your Finances? N  Housekeeping or managing your Housekeeping? N    Patient Care Team: [name], MD as PCP - General (Internal Medicine) Rachael Fee, MD as Attending Physician  (Gastroenterology) Szabat, Vinnie Level, [facility] (Inactive) as Pharmacist (Pharmacist) [name], MD as Consulting Physician (Oncology) [name], MD as Consulting Physician (Internal Medicine) Mateo Flow, MD as Consulting Physician (Ophthalmology)  Indicate any recent Medical Services you may have received from other than [facility] providers in the past year (date may be approximate).     Assessment:   This is a routine wellness examination for Brandon Rubio.  Hearing/Vision screen Hearing Screening - Comments:: Denies hearing issues.  Dietary issues and exercise activities discussed:     Goals Addressed             This Visit's Progress    Client understands the importance of follow-up with providers by attending scheduled visits   On track     Depression Screen    09/15/2022    2:21 PM 08/25/2022   10:32 AM 08/11/2022   10:46 AM 02/17/2022    8:24 AM 08/01/2021    9:18 AM 06/26/2021   11:38 AM 06/14/2020    9:40 AM  PHQ 2/9 Scores  PHQ - 2 Score 0 0 0 0 0 0 0  PHQ- 9 Score 1   0 0      Fall Risk    09/15/2022    2:15 PM 08/25/2022   10:32 AM 02/17/2022    8:24 AM 08/01/2021    9:18 AM 06/26/2021   11:50 AM  Fall Risk   Falls in the past year? 0 0 0 0 0  Number falls in past yr: 0 0 0 0 0  Injury with Fall? 0 0 0 0 0  Risk for fall due to : No Fall Risks    No Fall Risks  Follow up Falls prevention discussed Falls evaluation completed Falls evaluation completed      MEDICARE RISK AT HOME:  Medicare Risk at Home - 09/15/22 1445     Any stairs in or around the home? No    If so, are there any without handrails? No    Home free of loose throw  rugs in walkways, pet beds, electrical cords, etc? Yes    Adequate lighting in your home to reduce risk of falls? Yes    Life alert? No    Use of a cane, walker or w/c? Yes   cane   Grab bars in the bathroom? Yes    Shower chair or bench in shower? Yes    Elevated toilet seat or a handicapped toilet? Yes              TIMED UP AND GO:  Was the test performed?  No    Cognitive Function:        09/15/2022    2:15 PM 06/26/2021   11:54 AM  6CIT Screen  What Year? 0 points 0 points  What month? 0 points 0 points  What time? 0 points 0 points  Count back from 20 0 points 0 points  Months in reverse 0 points 0 points  Repeat phrase 4 points 0 points  Total Score 4 points 0 points    Immunizations Immunization History  Administered Date(s) Administered   Fluad Quad(high Dose 65+) 01/16/2021, 02/17/2022   Influenza, High Dose Seasonal PF 11/18/2017, 11/23/2018   Influenza,inj,Quad PF,6+ Mos 05/11/2015   Influenza-Unspecified 01/02/2013, 01/03/2014, 12/23/2018   PFIZER(Purple Top)SARS-COV-2 Vaccination 06/14/2019, 07/10/2019, 02/21/2020   Pneumococcal Conjugate-13 09/02/2017   Tdap 11/18/2017   Zoster, Live 01/03/2014    TDAP status: Up to date  Flu Vaccine status: Up to date  Pneumococcal vaccine status: Declined,  Education has been provided regarding the importance of this vaccine but patient still declined. Advised may receive this vaccine at local pharmacy or Health Dept. Aware to provide a copy of the vaccination record if obtained from local pharmacy or Health Dept. Verbalized acceptance and understanding.   Covid-19 vaccine status: Completed vaccines  Qualifies for Shingles Vaccine? Yes   Zostavax completed No   Shingrix Completed?: Yes  Screening Tests Health Maintenance  Topic Date Due   Zoster Vaccines- Shingrix (1 of 2) Never done   Pneumonia Vaccine 49+ Years old (2 of 2 - PPSV23 or PCV20) 10/28/2017   Diabetic kidney evaluation - Urine ACR  06/14/2021   COVID-19 Vaccine (4 - 2023-24 season) 11/08/2021   FOOT EXAM  08/31/2022   HEMOGLOBIN A1C  09/22/2022   INFLUENZA VACCINE  10/09/2022   OPHTHALMOLOGY EXAM  05/12/2023   Diabetic kidney evaluation - eGFR measurement  09/01/2023   Medicare Annual Wellness (AWV)  09/15/2023   DTaP/Tdap/Td (2 - Td or Tdap) 11/19/2027    Colonoscopy  01/01/2028   Hepatitis C Screening  Completed   HPV VACCINES  Aged Out    Health Maintenance  Health Maintenance Due  Topic Date Due   Zoster Vaccines- Shingrix (1 of 2) Never done   Pneumonia Vaccine 44+ Years old (2 of 2 - PPSV23 or PCV20) 10/28/2017   Diabetic kidney evaluation - Urine ACR  06/14/2021   COVID-19 Vaccine (4 - 2023-24 season) 11/08/2021   FOOT EXAM  08/31/2022    Colorectal cancer screening: Type of screening: Colonoscopy. Completed 12/31/20. Repeat every 7 years  Lung Cancer Screening: (Low Dose CT Chest recommended if Age 59-80 years, 20 pack-year currently smoking OR have quit w/in 15years.) does not qualify.   Lung Cancer Screening Referral: N/A  Additional Screening:  Hepatitis C Screening: does qualify; Completed 05/11/2015  Vision Screening: Recommended annual ophthalmology exams for early detection of glaucoma and other disorders of the eye. Is the patient up to date with their annual eye exam?  Yes  Who is the provider or what is the name of the office in which the patient attends annual eye exams? [facility] If pt is not established with a provider, would they like to be referred to a provider to establish care? No .   Dental Screening: Recommended annual dental exams for proper oral hygiene  Diabetic Foot Exam: Diabetic Foot Exam: Completed 08/30/21  Community Resource Referral / Chronic Care Management: CRR required this visit?  No   CCM required this visit?  No     Plan:     I have personally reviewed and noted the following in the patient's chart:   Medical and social history Use of alcohol, tobacco or illicit drugs  Current medications and supplements including opioid prescriptions. Patient is not currently taking opioid prescriptions. Functional ability and status Nutritional status Physical activity Advanced directives List of other physicians Hospitalizations, surgeries, and ER visits in previous 12  months Vitals Screenings to include cognitive, depression, and falls Referrals and appointments  In addition, I have reviewed and discussed with patient certain preventive protocols, quality metrics, and best practice recommendations. A written personalized care plan for preventive services as well as general preventive health recommendations were provided to patient.     Tiffanie Blassingame L Makaiyah Schweiger, CMA   09/15/2022   After Visit Summary: (MyChart) Due to this being a telephonic visit, the after visit summary with patients personalized plan was offered to patient via MyChart   Nurse Notes: Declined PNA and further Covid shots.

## 2022-09-15 NOTE — Telephone Encounter (Signed)
Prescription Request  09/15/2022  LOV: 08/25/2022  What is the name of the medication or equipment? HYDROcodone-acetaminophen (NORCO) 10-325 MG tablet   Have you contacted your pharmacy to request a refill? No   Which pharmacy would you like this sent to?  [id number] [place] Otto, [place] - [address] AT [facility] [address] & [facility] [address]  [place] [address] Phone: [phone] Fax: [phone]    Patient notified that their request is being sent to the clinical staff for review and that they should receive a response within 2 business days.   Please advise at Mobile There is no such number on file (mobile).

## 2022-09-16 ENCOUNTER — Other Ambulatory Visit: Payer: Self-pay

## 2022-09-16 MED ORDER — HYDROCODONE-ACETAMINOPHEN 10-325 MG PO TABS: 1.0000 | ORAL_TABLET | Freq: Two times a day (BID) | ORAL | 0 refills | Status: DC | PRN

## 2022-09-22 ENCOUNTER — Telehealth: Payer: Medicare Other | Admitting: Internal Medicine

## 2022-09-22 ENCOUNTER — Encounter: Payer: Self-pay | Admitting: Internal Medicine

## 2022-09-22 DIAGNOSIS — E2749 Other adrenocortical insufficiency: Secondary | ICD-10-CM | POA: Diagnosis not present

## 2022-09-22 DIAGNOSIS — E119 Type 2 diabetes mellitus without complications: Secondary | ICD-10-CM

## 2022-09-22 DIAGNOSIS — E1165 Type 2 diabetes mellitus with hyperglycemia: Secondary | ICD-10-CM | POA: Diagnosis not present

## 2022-09-22 DIAGNOSIS — E1159 Type 2 diabetes mellitus with other circulatory complications: Secondary | ICD-10-CM | POA: Diagnosis not present

## 2022-09-22 DIAGNOSIS — Z7984 Long term (current) use of oral hypoglycemic drugs: Secondary | ICD-10-CM

## 2022-09-22 MED ORDER — PREDNISONE 5 MG PO TABS
5.0000 mg | ORAL_TABLET | Freq: Every day | ORAL | 3 refills | Status: DC
Start: 2022-09-22 — End: 2023-06-29

## 2022-09-22 NOTE — Progress Notes (Signed)
Patient ID: Brandon Rubio., male   DOB: [birth year], 72 y.o.   MRN: [id number]  Patient location: Home My location: Office Persons participating in the virtual visit: patient, provider  Referring Provider: [name], MD  I connected with the patient on 09/22/22 at  8:26 AM EDT by telephone and verified that I am speaking with the correct person.   I discussed the limitations of evaluation and management by telephone and the availability of in person appointments. The patient expressed understanding and agreed to proceed.   Details of the encounter are shown below.  HPI  Brandon Rubio. is a 72 y.o.-year-old male, initially referred by his PCP, Dr. Okey Dupre, returning for management of adrenal insufficiency.  Last visit months ago.  Interim history: Patient has a history of lung cancer, with history of immunotherapy, chemotherapy and then radiation treatment which was stopped due to intolerance.  He then restarted immunotherapy 1x a mo before last visit.  Unfortunately, a CT scan on 03/10/2022 showed increased size of multiple pulmonary nodules, concerning for progressive metastatic disease.  The most recent chest CT from 07/21/2022 showed a progression of lung nodules and also aortic atherosclerosis. He was feeling tired and weak at last visit >> much improved after lowering ChTx doses.  No nausea or vomiting.  No significant weight loss. Back pain is improved on pain medicine (hydrocodone).  Reviewed history: Patient has a history of lung cancer stage III diagnosed 11/2020.  He was started on ChTx and Immuno Tx.  After 2 ChTx, he got very sick >> admitted. He then tried ChTx (but platelets decreased to low) and RxTx >> very sick >> admitted.  Of note, he got dexamethasone with his treatments. He completed 31/33 RxTx. This was resumed afterwards.  During both admissions, he was found to have dizziness and orthostatic  hypotension and had a low cortisol, which did not  stimulate well with cosyntropin: Component     Latest Ref Rng & Units 05/11/2021 05/12/2021  Cortisol, Base     ug/dL  0.7  Cortisol, 30 Min     ug/dL  8.0  Cortisol, 60 Min     ug/dL  08.6  Cortisol, Plasma     ug/dL 1.1   V784 ACTH     7.2 - 63.3 pg/mL  <1.5 (L)   MRI (05/15/2021); no pituitary mass  An adrenal insufficiency diagnosis was made and he was started on hydrocortisone: - 15 mg in am (7-8 am) - 10 mg in pm (6-7 pm)  At our first visit, I advised him to decrease the dose to: - 10 mg in am - 10 mg in pm  However, he was admitted again afterwards for the dizziness and weakness so we increased his hydrocortisone back to: - 15 mg in am - 10 mg in pm   In 06/2021, he contacted me with abdominal pain from hydrocortisone and also increased sweating.  I suggested prednisone: -Currently 7.5 mg daily  He was also on Midodrine, now off.  No h/o po ketoconazole, phenytoin, rifampin, chronic fluconazole use. No h/o autoimmune diseases in pt or family mbs. No excess use of NSAIDs. No h/o generalized infections or HIV. No IVDA. No h/o head injury or severe HA.  At our first visit in 05/2021 he mentioned: - + weight loss - lost 20 lbs in last 1.5 mo - + fatigue - + nausea - no vomiting - + abdominal pain - + mm aches (legs and back) - no palpitations - + mild  HAs - + dizziness - + presyncopal episodes (when taking a shower) - + SOB with minimal exertion  At today's visit, he still has fatigue and weakness, but the rest of the symptoms improved.  No hyponatremia:   Chemistry      Component Value Date/Time   NA 137 09/01/2022 0950   K 4.0 09/01/2022 0950   CL 104 09/01/2022 0950   CO2 23 09/01/2022 0950   BUN 9 09/01/2022 0950   CREATININE 0.93 09/01/2022 0950      Component Value Date/Time   CALCIUM 8.7 (L) 09/01/2022 0950   ALKPHOS 50 09/01/2022 0950   AST 27 09/01/2022 0950   ALT 24 09/01/2022 0950   BILITOT 0.4 09/01/2022 0950     Pt. also has a  history of DM2 with history of DKA.  However, this is more controlled lately: Lab Results  Component Value Date   HGBA1C 6.8 (A) 03/24/2022   HGBA1C 6.1 (H) 05/27/2021   HGBA1C 5.9 (H) 05/11/2021   He is on: - Metformin 500 >> 1000 mg 2x a day -  >> stopped due to good control -  >> stopped due to good control  CBGs: - am: 120-130, occas. 140  He has a history of hyperlipidemia for which she is on Zocor 40 mg daily. He was found to have aortic atherosclerosis on the CT scan from 03/10/2022. He also has a history of pancytopenia. He had COVID-19 in 02/2021.  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   Peripheral neuropathy    toes   Past Surgical History:  Procedure Laterality Date   BRONCHIAL BIOPSY  12/10/2020   Procedure: BRONCHIAL BIOPSIES;  Surgeon: Leslye Peer, MD;  Location: [facility] ENDOSCOPY;  Service: Pulmonary;;   BRONCHIAL BRUSHINGS  12/10/2020   Procedure: BRONCHIAL BRUSHINGS;  Surgeon: Leslye Peer, MD;  Location: [facility] ENDOSCOPY;  Service: Pulmonary;;   BRONCHIAL NEEDLE ASPIRATION BIOPSY  12/10/2020   Procedure: BRONCHIAL NEEDLE ASPIRATION BIOPSIES;  Surgeon: Leslye Peer, MD;  Location: [facility] ENDOSCOPY;  Service: Pulmonary;;   COLONOSCOPY     COLONOSCOPY W/ POLYPECTOMY  03/10/2009   LARYNGOSCOPY  03/10/1997   VIDEO BRONCHOSCOPY WITH ENDOBRONCHIAL NAVIGATION N/A 12/10/2020   Procedure: ROBOTIC VIDEO BRONCHOSCOPY WITH ENDOBRONCHIAL NAVIGATION;  Surgeon: Leslye Peer, MD;  Location: [facility] ENDOSCOPY;  Service: Pulmonary;  Laterality: N/A;   Social History   Socioeconomic History   Marital status:  Divorced    Spouse name: Not on file   Number of children: 0   Years of education: Not on file   Highest education level: Not on file  Occupational History   Occupation: Retired Personnel officer  Tobacco Use   Smoking status: Every Day    Current packs/day: 1.50    Average packs/day: 1.5 packs/day for 58.0 years (87.0 ttl pk-yrs)    Types: Cigarettes   Smokeless tobacco: Never   Tobacco comments:    2+ packs smoked daily ARJ 12/05/20  Vaping Use   Vaping status: Never Used  Substance and Sexual Activity   Alcohol use: No  Alcohol/week: 0.0 standard drinks of alcohol    Comment:  none since 04/2012   Drug use: Never   Sexual activity: Not on file  Other Topics Concern   Not on file  Social History Narrative   REG EXERCISE         Social Determinants of Health   Financial Resource Strain: Low Risk  (09/15/2022)   Overall Financial Resource Strain (CARDIA)    Difficulty of Paying Living Expenses: Not hard at all  Food Insecurity: No Food Insecurity (09/15/2022)   Hunger Vital Sign    Worried About Running Out of Food in the Last Year: Never true    Ran Out of Food in the Last Year: Never true  Transportation Needs: No Transportation Needs (09/15/2022)   PRAPARE - Administrator, Civil Service (Medical): No    Lack of Transportation (Non-Medical): No  Physical Activity: Inactive (09/15/2022)   Exercise Vital Sign    Days of Exercise per Week: 0 days    Minutes of Exercise per Session: 0 min  Stress: No Stress Concern Present (09/15/2022)   [employer] of Occupational Health - Occupational Stress Questionnaire    Feeling of Stress : Not at all  Social Connections: Socially Isolated (09/15/2022)   Social Connection and Isolation Panel [NHANES]    Frequency of Communication with Friends and Family: More than three times a week    Frequency of Social Gatherings with Friends and Family: Three times a week    Attends Religious Services: Never    Active Member of Clubs or  Organizations: No    Attends Banker Meetings: Never    Marital Status: Divorced  Catering manager Violence: Not At Risk (09/15/2022)   Humiliation, Afraid, Rape, and Kick questionnaire    Fear of Current or Ex-Partner: No    Emotionally Abused: No    Physically Abused: No    Sexually Abused: No   Current Outpatient Medications on File Prior to Visit  Medication Sig Dispense Refill   aspirin EC 81 MG tablet Take 81 mg by mouth in the morning. Swallow whole.     bisacodyl (DULCOLAX) 5 MG EC tablet Take 5 mg by mouth daily as needed for moderate constipation.     folic acid (FOLVITE) 1 MG tablet Take 1 tablet (1 mg total) by mouth daily. 30 tablet 2   HYDROcodone-acetaminophen (NORCO) 10-325 MG tablet Take 1 tablet by mouth 2 (two) times daily as needed. 60 tablet 0   HYDROcodone-acetaminophen (NORCO) 10-325 MG tablet Take 1 tablet by mouth every 12 (twelve) hours as needed. 60 tablet 0   metFORMIN (GLUCOPHAGE) 1000 MG tablet TAKE 1 TABLET(1000 MG) BY MOUTH TWICE DAILY WITH A MEAL 180 tablet 1   Multiple Vitamins-Minerals (CENTRUM SILVER 50+MEN) TABS Take 1 tablet by mouth daily with breakfast.     omeprazole (PRILOSEC) 20 MG capsule TAKE 1 CAPSULE BY MOUTH TWICE DAILY BEFORE A MEAL 180 capsule 1   ondansetron (ZOFRAN) 8 MG tablet Take 1 tablet (8 mg total) by mouth every 8 (eight) hours as needed for nausea or vomiting. 30 tablet 2   predniSONE (DELTASONE) 5 MG tablet Take 1.5 tablets (7.5 mg total) by mouth daily with breakfast. 135 tablet 2   prochlorperazine (COMPAZINE) 10 MG tablet Take 1 tablet (10 mg total) by mouth every 6 (six) hours as needed for nausea or vomiting. (Patient not taking: Reported on 09/15/2022) 60 tablet 1   simvastatin (ZOCOR) 40 MG tablet TAKE 1 TABLET BY  MOUTH EVERY EVENING AT 6 PM. 90 tablet 3   No current facility-administered medications on file prior to visit.   Allergies  Allergen Reactions   Penicillins Anaphylaxis and Swelling    THE THROAT  SWELLS!!!! Because of a history of documented adverse serious drug reaction;[name] Alert bracelet  is recommended   Albumin (Human) Hives and Itching   Family History  Problem Relation Age of Onset   Lung cancer Mother        X 2   Colon polyps Father    Colon cancer Father 16   Coronary artery disease Father    Lung disease Father    Esophageal cancer Neg Hx    Stomach cancer Neg Hx    Rectal cancer Neg Hx    PE: There were no vitals taken for this visit. Wt Readings from Last 3 Encounters:  09/15/22 190 lb (86.2 kg)  09/01/22 190 lb 11.2 oz (86.5 kg)  08/25/22 188 lb (85.3 kg)   Constitutional:  in NAD  The physical exam was not performed (virtual visit).   ASSESSMENT: 1. Adrenal insufficiency  2. DM2, non-insulin-dependent, with complications - Ao atherosclerosis  PLAN:  1. Adrenal insufficiency (AI) -Most likely due to steroid use (dexamethasone) during chemotherapy for his lung cancer -His cortisol level did not stimulate well with cosyntropin (expected level of cortisol of >14.7), consistent with a diagnosis of adrenal insufficiency.  His ACTH was low, indicating a central etiology.  He was started on hydrocortisone in the hospital and we continued this.  -He had significant dizziness, weakness, this equilibrium, despite hydrocortisone 15 mg in a.m. and 10 mg in p.m.  We did discuss about trying to decrease the dose slowly but she was not able to tolerate this due to significant weakness-had to go to the emergency room.  This effect could have been related to his radiation treatments, since he presented with similar symptoms before, however, due to this, we went back to the previous dose of 15 mg in a.m. and 10 in p.m.  He developed abdominal pain and swelling so we ended up switching to prednisone 7.5 mg daily.  On this, he started to feel better. -At last visit, he had decreased appetite but no nausea or vomiting.  He lost a significant amount of weight, approximately 20  pounds, but before last visit, he gained 14.  We did discuss about trying to build up strength as I suspect deconditioning.  He had back pain and I advised him to use a stationary bike or to try swimming.  At today's visit, he tells me that he feels much better, not as weak after his chemotherapy doses were reduced.  He still has back pain, but this is helped by pain medication.  He can still not exercise due to feeling poorly approximately 10 days after his chemotherapy sessions every 3 weeks. -Since he is feeling better, we discussed about possibly reducing the prednisone dose.  I advised him to first alternate between 7.5 and 5 m every other day, and then reducing the dose to 5 mg daily if g he feels well.  He agrees to try this. -I reiterated sick day rules: - You absolutely need to take this medication every day and not skip doses. - Please double the dose if you have a fever, for the duration of the fever. - If you cannot take anything by mouth (vomiting) or you have severe diarrhea so that you eliminate the prednisone pills in your stool, please make sure  that you get the steroid in the vein instead - go to the nearest emergency department/urgent care or you may go to your PCPs office  - Please try to get a MedAlert bracelet or pendant indicating: "Adrenal insufficiency".  -I previously sent hydrocortisone solution to his pharmacy for situations when he could not take p.o.  Unfortunately, hydrocortisone and dexamethasone were not available and we discussed about going to the nearest emergency room or an urgent care to get steroids parenterally if needed.  Last visit, I offered to send an IV hydrocortisone prescription to his pharmacy but he preferred to go to urgent care if he could not take p.o. steroids -I again advised him to get a med alert bracelet or pendant.  He only has a wallet card. -I plan to see him back in 6 months  2. DM -Reviewed latest HbA1c-at goal, higher than before, as he  mentions that he relaxed his diet before last visit: Lab Results  Component Value Date   HGBA1C 6.8 (A) 03/24/2022  -He was previously on insulin and SGLT2 inhibitor, but these were stopped due to good control -He continues only on metformin 1000 mg twice a day, tolerated well -This is also followed by PCP  - time spent on the with the patient: 16 min, of which >50% was spent in obtaining information about his adrenal insufficiency and diabetes, reviewing previous labs, office visit notes, and treatments, counseling him about the above conditions (please see the discussed topics above). We also discussed about proper steroid dosing.  [name], MD PhD [facility]

## 2022-09-22 NOTE — Patient Instructions (Addendum)
Please decrease the dose of Prednisone 5 mg in am.  You absolutely need to take this medication every day and not skip doses.  Please double the dose if you have a fever, for the duration of the fever.  Also, if you have significant other stress like dehydration, diarrhea, or otherwise unexplained weakness.  If you cannot take anything by mouth (vomiting) or you have severe diarrhea so that you eliminate the Prednisone pills in your stool, please make sure that you get the steroid in the vein instead - go to the nearest emergency department/urgent care or you may go to your PCPs office.  Please get a MedAlert bracelet or pendant indicating: "Adrenal insufficiency".   Please come back for a follow-up appointment in  6 months.

## 2022-09-23 ENCOUNTER — Inpatient Hospital Stay: Payer: Medicare Other

## 2022-09-23 ENCOUNTER — Inpatient Hospital Stay: Payer: Medicare Other | Attending: Internal Medicine

## 2022-09-23 ENCOUNTER — Other Ambulatory Visit: Payer: Self-pay

## 2022-09-23 ENCOUNTER — Inpatient Hospital Stay: Payer: Medicare Other | Admitting: Internal Medicine

## 2022-09-23 DIAGNOSIS — C3431 Malignant neoplasm of lower lobe, right bronchus or lung: Secondary | ICD-10-CM | POA: Diagnosis not present

## 2022-09-23 DIAGNOSIS — Z5111 Encounter for antineoplastic chemotherapy: Secondary | ICD-10-CM | POA: Diagnosis not present

## 2022-09-23 DIAGNOSIS — Z79899 Other long term (current) drug therapy: Secondary | ICD-10-CM | POA: Insufficient documentation

## 2022-09-23 DIAGNOSIS — C3491 Malignant neoplasm of unspecified part of right bronchus or lung: Secondary | ICD-10-CM

## 2022-09-23 LAB — CBC WITH DIFFERENTIAL (CANCER CENTER ONLY)
Abs Immature Granulocytes: 0.03 10*3/uL (ref 0.00–0.07)
Basophils Absolute: 0.1 10*3/uL (ref 0.0–0.1)
Basophils Relative: 1 %
Eosinophils Absolute: 0.3 10*3/uL (ref 0.0–0.5)
Eosinophils Relative: 3 %
HCT: 34.8 % — ABNORMAL LOW (ref 39.0–52.0)
Hemoglobin: 11.9 g/dL — ABNORMAL LOW (ref 13.0–17.0)
Immature Granulocytes: 0 %
Lymphocytes Relative: 21 %
Lymphs Abs: 2 10*3/uL (ref 0.7–4.0)
MCH: 33.5 pg (ref 26.0–34.0)
MCHC: 34.2 g/dL (ref 30.0–36.0)
MCV: 98 fL (ref 80.0–100.0)
Monocytes Absolute: 1 10*3/uL (ref 0.1–1.0)
Monocytes Relative: 10 %
Neutro Abs: 6 10*3/uL (ref 1.7–7.7)
Neutrophils Relative %: 65 %
Platelet Count: 192 10*3/uL (ref 150–400)
RBC: 3.55 MIL/uL — ABNORMAL LOW (ref 4.22–5.81)
RDW: 13.6 % (ref 11.5–15.5)
WBC Count: 9.4 10*3/uL (ref 4.0–10.5)
nRBC: 0 % (ref 0.0–0.2)

## 2022-09-23 LAB — CMP (CANCER CENTER ONLY)
ALT: 30 U/L (ref 0–44)
AST: 28 U/L (ref 15–41)
Albumin: 4.1 g/dL (ref 3.5–5.0)
Alkaline Phosphatase: 65 U/L (ref 38–126)
Anion gap: 12 (ref 5–15)
BUN: 12 mg/dL (ref 8–23)
CO2: 22 mmol/L (ref 22–32)
Calcium: 9.6 mg/dL (ref 8.9–10.3)
Chloride: 104 mmol/L (ref 98–111)
Creatinine: 1.02 mg/dL (ref 0.61–1.24)
GFR, Estimated: >60 mL/min (ref >60)
Glucose, Bld: 125 mg/dL — ABNORMAL HIGH (ref 70–99)
Potassium: 3.8 mmol/L (ref 3.5–5.1)
Sodium: 138 mmol/L (ref 135–145)
Total Bilirubin: 0.4 mg/dL (ref 0.3–1.2)
Total Protein: 7.6 g/dL (ref 6.5–8.1)

## 2022-09-23 MED ORDER — PROCHLORPERAZINE MALEATE 10 MG PO TABS
10.0000 mg | ORAL_TABLET | Freq: Once | ORAL | Status: AC
  Administered 2022-09-23: 10 mg via ORAL
  Filled 2022-09-23: qty 1

## 2022-09-23 MED ORDER — SODIUM CHLORIDE 0.9 % IV SOLN: Freq: Once | INTRAVENOUS | Status: AC

## 2022-09-23 MED ORDER — SODIUM CHLORIDE 0.9 % IV SOLN
400.0000 mg/m2 | Freq: Once | INTRAVENOUS | Status: AC
  Administered 2022-09-23: 900 mg via INTRAVENOUS
  Filled 2022-09-23: qty 20

## 2022-09-23 MED ORDER — SODIUM CHLORIDE 0.9 % IV SOLN
200.0000 mg | Freq: Once | INTRAVENOUS | Status: AC
  Administered 2022-09-23: 200 mg via INTRAVENOUS
  Filled 2022-09-23: qty 200

## 2022-09-23 NOTE — Patient Instructions (Signed)
 [facility]  Discharge Instructions: Thank you for choosing [facility] to provide your oncology and hematology care.   If you have a lab appointment with the [facility], please go directly to the [facility] and check in at the registration area.   Wear comfortable clothing and clothing appropriate for easy access to any Portacath or PICC line.   We strive to give you quality time with your provider. You may need to reschedule your appointment if you arrive late (15 or more minutes).  Arriving late affects you and other patients whose appointments are after yours.  Also, if you miss three or more appointments without notifying the office, you may be dismissed from the clinic at the provider's discretion.      For prescription refill requests, have your pharmacy contact our office and allow 72 hours for refills to be completed.    Today you received the following chemotherapy and/or immunotherapy agents: Keytruda, Alimta.       To help prevent nausea and vomiting after your treatment, we encourage you to take your nausea medication as directed.  BELOW ARE SYMPTOMS THAT SHOULD BE REPORTED IMMEDIATELY: *FEVER GREATER THAN 100.4 F (38 C) OR HIGHER *CHILLS OR SWEATING *NAUSEA AND VOMITING THAT IS NOT CONTROLLED WITH YOUR NAUSEA MEDICATION *UNUSUAL SHORTNESS OF BREATH *UNUSUAL BRUISING OR BLEEDING *URINARY PROBLEMS (pain or burning when urinating, or frequent urination) *BOWEL PROBLEMS (unusual diarrhea, constipation, pain near the anus) TENDERNESS IN MOUTH AND THROAT WITH OR WITHOUT PRESENCE OF ULCERS (sore throat, sores in mouth, or a toothache) UNUSUAL RASH, SWELLING OR PAIN  UNUSUAL VAGINAL DISCHARGE OR ITCHING   Items with * indicate a potential emergency and should be followed up as soon as possible or go to the Emergency Department if any problems should occur.  Please show the CHEMOTHERAPY ALERT CARD or IMMUNOTHERAPY ALERT CARD  at check-in to the Emergency Department and triage nurse.  Should you have questions after your visit or need to cancel or reschedule your appointment, please contact [facility]  Dept: [phone]  and follow the prompts.  Office hours are 8:00 a.m. to 4:30 p.m. Monday - Friday. Please note that voicemails left after 4:00 p.m. may not be returned until the following business day.  We are closed weekends and major holidays. You have access to a nurse at all times for urgent questions. Please call the main number to the clinic Dept: [phone] and follow the prompts.   For any non-urgent questions, you may also contact your provider using MyChart. We now offer e-Visits for anyone 34 and older to request care online for non-urgent symptoms. For details visit [email or web address].   Also download the MyChart app! Go to the app store, search "MyChart", open the app, select [facility], and log in with your MyChart username and password.

## 2022-09-23 NOTE — Progress Notes (Signed)
OK to treat with HR 112 per Dr Arbutus Ped

## 2022-09-23 NOTE — Progress Notes (Signed)
[facility] [phone]   [phone]  OFFICE PROGRESS NOTE  [name], MD [address] [place] [address]  DIAGNOSIS: Stage IIIA (T4, N0, M0) non-small cell lung cancer, favoring adenocarcinoma presented with large right lower lobe lung mass with no mediastinal lymphadenopathy or extrathoracic metastasis diagnosed in October 2022. Had disease progression with enlarging mass and new right hilar and mediastinal lymph nodes in December 2022.      PRIOR THERAPY:  1) Neoadjuvant treatment with carboplatin for AUC of 5, Alimta 500 Mg/M2 and nivolumab 360 Mg IV every 3 weeks.  First dose January 23, 2021.  Status post 3 cycles. 2) Concurrent chemoradiation with carboplatin for an AUC of 2 and paclitaxel 45 mg per metered squared.  First dose expected on 04/01/2021.  Status post 4 cycles 3) Consolidation treatment with immunotherapy with Imfinzi 1500 Mg IV every 4 weeks.  First dose on Jul 08, 2021.  Status post 9 cycles.    CURRENT THERAPY: Palliative systemic chemotherapy with carboplatin for an AUC of 5, Alimta 500 mg/m, Keytruda 200 mg IV every 3 weeks. First dose on 05/13/2022.  Status post 6 cycles.  Starting him cycle #3 his dose of carboplatin will be reduced to AUC of 4 and Alimta 400 Mg/M2 secondary to intolerance.  INTERVAL HISTORY: Brandon Rubio. 72 y.o. male returns to the clinic today for follow-up visit.  The patient is feeling fine today with no concerning complaints.  He denied having any chest pain, shortness of breath, cough or hemoptysis.  He has no nausea, vomiting, diarrhea or constipation.  He has no headache or visual changes.  He denied having any fever or chills.  He has been tolerating his treatment with maintenance Alimta and Keytruda fairly well.  He is here today for evaluation before starting cycle #7.  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  MEDICATIONS:  Current Outpatient Medications  Medication Sig Dispense Refill   aspirin EC 81 MG tablet Take 81 mg by mouth in the morning. Swallow whole.     bisacodyl (DULCOLAX) 5 MG EC tablet Take 5 mg by mouth daily as needed for moderate constipation.     folic acid (FOLVITE) 1 MG tablet Take 1 tablet (1 mg total) by mouth daily. 30 tablet 2   HYDROcodone-acetaminophen (NORCO) 10-325 MG tablet Take 1 tablet by mouth 2 (two) times daily as needed. 60 tablet 0   HYDROcodone-acetaminophen (NORCO) 10-325 MG tablet Take 1 tablet by mouth every 12 (twelve) hours as needed. 60 tablet 0   metFORMIN (GLUCOPHAGE) 1000 MG tablet TAKE 1 TABLET(1000 MG) BY MOUTH TWICE DAILY WITH A MEAL 180 tablet 1   Multiple Vitamins-Minerals (CENTRUM SILVER 50+MEN) TABS Take 1 tablet by mouth daily with breakfast.     omeprazole (PRILOSEC) 20 MG capsule TAKE 1 CAPSULE BY MOUTH TWICE DAILY BEFORE A MEAL 180 capsule 1   ondansetron (ZOFRAN) 8 MG tablet Take 1  tablet (8 mg total) by mouth every 8 (eight) hours as needed for nausea or vomiting. 30 tablet 2   predniSONE (DELTASONE) 5 MG tablet Take 1-1.5 tablets (5-7.5 mg total) by mouth daily with breakfast. 135 tablet 3   prochlorperazine (COMPAZINE) 10 MG tablet Take 1 tablet (10 mg total) by mouth every 6 (six) hours as needed for nausea or vomiting. 60 tablet 1   simvastatin (ZOCOR) 40 MG tablet TAKE 1 TABLET BY MOUTH EVERY EVENING AT 6 PM. 90 tablet 3   No current facility-administered medications for this visit.    SURGICAL HISTORY:  Past Surgical History:  Procedure  Laterality Date   BRONCHIAL BIOPSY  12/10/2020   Procedure: BRONCHIAL BIOPSIES;  Surgeon: Leslye Peer, MD;  Location: [facility] ENDOSCOPY;  Service: Pulmonary;;   BRONCHIAL BRUSHINGS  12/10/2020   Procedure: BRONCHIAL BRUSHINGS;  Surgeon: Leslye Peer, MD;  Location[facility] ENDOSCOPY;  Service: Pulmonary;;   BRONCHIAL NEEDLE ASPIRATION BIOPSY  12/10/2020   Procedure: BRONCHIAL NEEDLE ASPIRATION BIOPSIES;  Surgeon: Leslye Peer, MD;  Location: [facility] ENDOSCOPY;  Service: Pulmonary;;   COLONOSCOPY     COLONOSCOPY W/ POLYPECTOMY  03/10/2009   LARYNGOSCOPY  03/10/1997   VIDEO BRONCHOSCOPY WITH ENDOBRONCHIAL NAVIGATION N/A 12/10/2020   Procedure: ROBOTIC VIDEO BRONCHOSCOPY WITH ENDOBRONCHIAL NAVIGATION;  Surgeon: Leslye Peer, MD;  Location: [facility] ENDOSCOPY;  Service: Pulmonary;  Laterality: N/A;    REVIEW OF SYSTEMS:  A comprehensive review of systems was negative.   PHYSICAL EXAMINATION: General appearance: alert, cooperative, and no distress Head: Normocephalic, without obvious abnormality, atraumatic Neck: no adenopathy, no JVD, supple, symmetrical, trachea midline, and thyroid not enlarged, symmetric, no tenderness/mass/nodules Lymph nodes: Cervical, supraclavicular, and axillary nodes normal. Resp: clear to auscultation bilaterally Back: symmetric, no curvature. ROM normal. No CVA tenderness. Cardio: regular rate and rhythm, S1, S2 normal, no murmur, click, rub or gallop GI: soft, non-tender; bowel sounds normal; no masses,  no organomegaly Extremities: extremities normal, atraumatic, no cyanosis or edema  ECOG PERFORMANCE STATUS: 1 - Symptomatic but completely ambulatory  Blood pressure 124/76, pulse (!) 112, temperature 98.3 F (36.8 C), temperature source Oral, resp. rate 17, height 6' (1.829 m), weight 190 lb 14.4 oz (86.6 kg), SpO2 97%.  LABORATORY DATA: Lab Results  Component Value Date   WBC 9.4 09/23/2022   HGB 11.9 (L) 09/23/2022   HCT 34.8 (L) 09/23/2022   MCV 98.0  09/23/2022   PLT 192 09/23/2022      Chemistry      Component Value Date/Time   NA 137 09/01/2022 0950   K 4.0 09/01/2022 0950   CL 104 09/01/2022 0950   CO2 23 09/01/2022 0950   BUN 9 09/01/2022 0950   CREATININE 0.93 09/01/2022 0950      Component Value Date/Time   CALCIUM 8.7 (L) 09/01/2022 0950   ALKPHOS 50 09/01/2022 0950   AST 27 09/01/2022 0950   ALT 24 09/01/2022 0950   BILITOT 0.4 09/01/2022 0950       RADIOGRAPHIC STUDIES: CT Chest W Contrast  Result Date: 09/01/2022 CLINICAL DATA:  Non-small cell lung cancer; * Tracking Code: BO * EXAM: CT CHEST WITH CONTRAST TECHNIQUE: Multidetector CT imaging of the chest was performed during intravenous contrast administration. RADIATION DOSE REDUCTION: This exam was performed according to the departmental dose-optimization program which includes automated exposure control, adjustment of the mA and/or kV according to patient size and/or use of iterative reconstruction technique. CONTRAST:  75mL OMNIPAQUE IOHEXOL 300 MG/ML  SOLN COMPARISON:  CT chest,  abdomen and pelvis dated Jul 18, 2022 FINDINGS: Cardiovascular: Normal heart size. Trace pericardial effusion. Normal caliber thoracic aorta with moderate atherosclerotic disease. Severe coronary artery calcifications. Mediastinum/Nodes: Esophagus and thyroid are unremarkable. No enlarged lymph nodes seen in the chest. Lungs/Pleura: Central airways are patent. Moderate centrilobular emphysema. Stable postradiation change of the right lower lobe. Numerous bilateral solid and subsolid nodules are stable when compared with most recent prior exam, but have increased in size when compared with more remote priors. Reference irregular solid right middle lobe nodule measuring 12 x 8 mm on series 6, image 77, unchanged. Reference left upper lobe part solid nodule measuring 17 x 16 mm with 12 mm solid component, unchanged. Upper Abdomen: No acute abnormality. Musculoskeletal: No chest wall abnormality. No  acute or significant osseous findings. IMPRESSION: 1. Stable right lower lobe postradiation change. 2. Numerous bilateral solid and subsolid nodules are stable when compared with most recent prior exam, but have increased in size when compared with more remote priors, consistent with metastatic disease. 3. Aortic Atherosclerosis (ICD10-I70.0) and Emphysema (ICD10-J43.9). Electronically Signed   By: Allegra Lai M.D.   On: 09/01/2022 10:00    ASSESSMENT AND PLAN: This is a very pleasant 72 years old white male with history of a stage IIIA (T4, N1, M0) non-small cell lung cancer, adenocarcinoma presented with right lower lobe lung mass with a right hilar and no mediastinal lymphadenopathy and no evidence of extrathoracic disease.  The patient was diagnosed in October 2022 status post 3 cycles of neoadjuvant systemic chemotherapy with carboplatin, Alimta and nivolumab but unfortunately has no evidence for significant improvement of his disease and actually has some evidence for progression. The patient underwent a course of concurrent chemoradiation weekly carboplatin for AUC of 2 and paclitaxel 45 Mg/M2.  Status post 4 cycles. The patient had a rough time with the course of concurrent chemoradiation and he was hospitalized several times during this course. Imaging studies after the course of concurrent chemoradiation showed no concerning findings for disease progression. The patient underwent treatment with immunotherapy with Imfinzi 1500 Mg IV every 4 weeks status post 9 cycles.  This treatment was discontinued secondary to disease progression. The patient is currently on systemic chemotherapy with carboplatin, Alimta and Keytruda status post 6 cycles.  Starting from cycle #5 he has been on treatment with maintenance Alimta and Keytruda every 3 weeks. The patient tolerated the last cycle of his treatment much better after discontinuation of the carboplatin. The patient is feeling fine today with no  concerning complaints. I recommended for him to proceed with cycle #7 today as planned. I will see him back for follow-up visit in 3 weeks for evaluation before the next cycle of his treatment. He was advised to call immediately if he has any other concerning symptoms in the interval. The patient voices understanding of current disease status and treatment options and is in agreement with the current care plan.  All questions were answered. The patient knows to call the clinic with any problems, questions or concerns. We can certainly see the patient much sooner if necessary.   Disclaimer: This note was dictated with voice recognition software. Similar sounding words can inadvertently be transcribed and may not be corrected upon review.

## 2022-09-27 ENCOUNTER — Other Ambulatory Visit: Payer: Self-pay | Admitting: Internal Medicine

## 2022-09-29 ENCOUNTER — Other Ambulatory Visit: Payer: Medicare Other

## 2022-10-02 ENCOUNTER — Other Ambulatory Visit: Payer: Self-pay

## 2022-10-02 ENCOUNTER — Encounter (HOSPITAL_COMMUNITY): Payer: Self-pay | Admitting: Internal Medicine

## 2022-10-02 ENCOUNTER — Telehealth: Payer: Self-pay

## 2022-10-02 ENCOUNTER — Inpatient Hospital Stay: Payer: Medicare Other

## 2022-10-02 ENCOUNTER — Inpatient Hospital Stay (HOSPITAL_BASED_OUTPATIENT_CLINIC_OR_DEPARTMENT_OTHER): Payer: Medicare Other | Admitting: Physician Assistant

## 2022-10-02 ENCOUNTER — Inpatient Hospital Stay (HOSPITAL_COMMUNITY)
Admission: EM | Admit: 2022-10-02 | Discharge: 2022-10-07 | DRG: 641 | Disposition: A | Discharge status: Home / Self Care | Payer: Medicare Other | Attending: Internal Medicine | Admitting: Internal Medicine

## 2022-10-02 ENCOUNTER — Emergency Department (HOSPITAL_COMMUNITY): Payer: Medicare Other

## 2022-10-02 VITALS — BP 117/87 | HR 116 | Temp 97.5°F | Resp 18 | Wt 177.6 lb

## 2022-10-02 DIAGNOSIS — K30 Functional dyspepsia: Secondary | ICD-10-CM | POA: Diagnosis present

## 2022-10-02 DIAGNOSIS — C3491 Malignant neoplasm of unspecified part of right bronchus or lung: Secondary | ICD-10-CM

## 2022-10-02 DIAGNOSIS — I10 Essential (primary) hypertension: Secondary | ICD-10-CM | POA: Diagnosis not present

## 2022-10-02 DIAGNOSIS — Z7984 Long term (current) use of oral hypoglycemic drugs: Secondary | ICD-10-CM | POA: Diagnosis not present

## 2022-10-02 DIAGNOSIS — R531 Weakness: Principal | ICD-10-CM

## 2022-10-02 DIAGNOSIS — Z91199 Patient's noncompliance with other medical treatment and regimen due to unspecified reason: Secondary | ICD-10-CM

## 2022-10-02 DIAGNOSIS — Z880 Allergy status to penicillin: Secondary | ICD-10-CM | POA: Diagnosis not present

## 2022-10-02 DIAGNOSIS — E1165 Type 2 diabetes mellitus with hyperglycemia: Secondary | ICD-10-CM | POA: Diagnosis not present

## 2022-10-02 DIAGNOSIS — E785 Hyperlipidemia, unspecified: Secondary | ICD-10-CM | POA: Diagnosis present

## 2022-10-02 DIAGNOSIS — F17210 Nicotine dependence, cigarettes, uncomplicated: Secondary | ICD-10-CM | POA: Diagnosis present

## 2022-10-02 DIAGNOSIS — E871 Hypo-osmolality and hyponatremia: Secondary | ICD-10-CM | POA: Diagnosis not present

## 2022-10-02 DIAGNOSIS — E872 Acidosis, unspecified: Secondary | ICD-10-CM | POA: Diagnosis not present

## 2022-10-02 DIAGNOSIS — I252 Old myocardial infarction: Secondary | ICD-10-CM

## 2022-10-02 DIAGNOSIS — E2740 Unspecified adrenocortical insufficiency: Secondary | ICD-10-CM | POA: Diagnosis not present

## 2022-10-02 DIAGNOSIS — I2510 Atherosclerotic heart disease of native coronary artery without angina pectoris: Secondary | ICD-10-CM | POA: Diagnosis present

## 2022-10-02 DIAGNOSIS — E1169 Type 2 diabetes mellitus with other specified complication: Secondary | ICD-10-CM | POA: Diagnosis present

## 2022-10-02 DIAGNOSIS — E861 Hypovolemia: Secondary | ICD-10-CM | POA: Diagnosis not present

## 2022-10-02 DIAGNOSIS — Z6825 Body mass index (BMI) 25.0-25.9, adult: Secondary | ICD-10-CM

## 2022-10-02 DIAGNOSIS — K219 Gastro-esophageal reflux disease without esophagitis: Secondary | ICD-10-CM | POA: Diagnosis present

## 2022-10-02 DIAGNOSIS — Z9189 Other specified personal risk factors, not elsewhere classified: Secondary | ICD-10-CM

## 2022-10-02 DIAGNOSIS — R000 Tachycardia, unspecified: Secondary | ICD-10-CM

## 2022-10-02 DIAGNOSIS — E876 Hypokalemia: Secondary | ICD-10-CM | POA: Diagnosis present

## 2022-10-02 DIAGNOSIS — E118 Type 2 diabetes mellitus with unspecified complications: Secondary | ICD-10-CM | POA: Diagnosis present

## 2022-10-02 DIAGNOSIS — R627 Adult failure to thrive: Principal | ICD-10-CM | POA: Diagnosis present

## 2022-10-02 DIAGNOSIS — E2749 Other adrenocortical insufficiency: Secondary | ICD-10-CM | POA: Diagnosis present

## 2022-10-02 DIAGNOSIS — E440 Moderate protein-calorie malnutrition: Secondary | ICD-10-CM | POA: Diagnosis not present

## 2022-10-02 DIAGNOSIS — I493 Ventricular premature depolarization: Secondary | ICD-10-CM | POA: Diagnosis present

## 2022-10-02 DIAGNOSIS — R7989 Other specified abnormal findings of blood chemistry: Secondary | ICD-10-CM | POA: Diagnosis not present

## 2022-10-02 DIAGNOSIS — R918 Other nonspecific abnormal finding of lung field: Secondary | ICD-10-CM | POA: Diagnosis not present

## 2022-10-02 DIAGNOSIS — Z86718 Personal history of other venous thrombosis and embolism: Secondary | ICD-10-CM

## 2022-10-02 DIAGNOSIS — R9431 Abnormal electrocardiogram [ECG] [EKG]: Secondary | ICD-10-CM | POA: Diagnosis not present

## 2022-10-02 DIAGNOSIS — R42 Dizziness and giddiness: Secondary | ICD-10-CM

## 2022-10-02 DIAGNOSIS — Z7982 Long term (current) use of aspirin: Secondary | ICD-10-CM

## 2022-10-02 DIAGNOSIS — E1142 Type 2 diabetes mellitus with diabetic polyneuropathy: Secondary | ICD-10-CM | POA: Diagnosis present

## 2022-10-02 DIAGNOSIS — E860 Dehydration: Secondary | ICD-10-CM

## 2022-10-02 DIAGNOSIS — Z8249 Family history of ischemic heart disease and other diseases of the circulatory system: Secondary | ICD-10-CM

## 2022-10-02 DIAGNOSIS — R5381 Other malaise: Secondary | ICD-10-CM | POA: Diagnosis present

## 2022-10-02 DIAGNOSIS — J9811 Atelectasis: Secondary | ICD-10-CM | POA: Diagnosis not present

## 2022-10-02 DIAGNOSIS — Z79899 Other long term (current) drug therapy: Secondary | ICD-10-CM

## 2022-10-02 DIAGNOSIS — Z801 Family history of malignant neoplasm of trachea, bronchus and lung: Secondary | ICD-10-CM

## 2022-10-02 DIAGNOSIS — I700 Atherosclerosis of aorta: Secondary | ICD-10-CM | POA: Diagnosis not present

## 2022-10-02 DIAGNOSIS — I471 Supraventricular tachycardia, unspecified: Secondary | ICD-10-CM | POA: Diagnosis not present

## 2022-10-02 DIAGNOSIS — Z83719 Family history of colon polyps, unspecified: Secondary | ICD-10-CM

## 2022-10-02 DIAGNOSIS — Z7952 Long term (current) use of systemic steroids: Secondary | ICD-10-CM

## 2022-10-02 DIAGNOSIS — Z800 Family history of malignant neoplasm of digestive organs: Secondary | ICD-10-CM

## 2022-10-02 DIAGNOSIS — Z9221 Personal history of antineoplastic chemotherapy: Secondary | ICD-10-CM

## 2022-10-02 DIAGNOSIS — R002 Palpitations: Secondary | ICD-10-CM | POA: Diagnosis present

## 2022-10-02 LAB — URINALYSIS, ROUTINE W REFLEX MICROSCOPIC
Bacteria, UA: NONE SEEN
Bilirubin Urine: NEGATIVE
Glucose, UA: NEGATIVE mg/dL
Hgb urine dipstick: NEGATIVE
Ketones, ur: 80 mg/dL — AB
Leukocytes,Ua: NEGATIVE
Nitrite: NEGATIVE
Protein, ur: 30 mg/dL — AB
Specific Gravity, Urine: 1.019 (ref 1.005–1.030)
pH: 5 (ref 5.0–8.0)

## 2022-10-02 LAB — CMP (CANCER CENTER ONLY)
ALT: 22 U/L (ref 0–44)
AST: 29 U/L (ref 15–41)
Albumin: 4.1 g/dL (ref 3.5–5.0)
Alkaline Phosphatase: 62 U/L (ref 38–126)
Anion gap: 18 — ABNORMAL HIGH (ref 5–15)
BUN: 24 mg/dL — ABNORMAL HIGH (ref 8–23)
CO2: 17 mmol/L — ABNORMAL LOW (ref 22–32)
Calcium: 9.8 mg/dL (ref 8.9–10.3)
Chloride: 99 mmol/L (ref 98–111)
Creatinine: 1.19 mg/dL (ref 0.61–1.24)
GFR, Estimated: >60 mL/min (ref >60)
Glucose, Bld: 144 mg/dL — ABNORMAL HIGH (ref 70–99)
Potassium: 3.7 mmol/L (ref 3.5–5.1)
Sodium: 134 mmol/L — ABNORMAL LOW (ref 135–145)
Total Bilirubin: 0.5 mg/dL (ref 0.3–1.2)
Total Protein: 7.7 g/dL (ref 6.5–8.1)

## 2022-10-02 LAB — TROPONIN I (HIGH SENSITIVITY)
Troponin I (High Sensitivity): 7 ng/L (ref <18)
Troponin I (High Sensitivity): 6 ng/L (ref <18)

## 2022-10-02 LAB — CBC WITH DIFFERENTIAL (CANCER CENTER ONLY)
Abs Immature Granulocytes: 0.03 10*3/uL (ref 0.00–0.07)
Basophils Absolute: 0 10*3/uL (ref 0.0–0.1)
Basophils Relative: 1 %
Eosinophils Absolute: 0.3 10*3/uL (ref 0.0–0.5)
Eosinophils Relative: 6 %
HCT: 33.8 % — ABNORMAL LOW (ref 39.0–52.0)
Hemoglobin: 12 g/dL — ABNORMAL LOW (ref 13.0–17.0)
Immature Granulocytes: 1 %
Lymphocytes Relative: 22 %
Lymphs Abs: 1 10*3/uL (ref 0.7–4.0)
MCH: 33.2 pg (ref 26.0–34.0)
MCHC: 35.5 g/dL (ref 30.0–36.0)
MCV: 93.6 fL (ref 80.0–100.0)
Monocytes Absolute: 0.8 10*3/uL (ref 0.1–1.0)
Monocytes Relative: 19 %
Neutro Abs: 2.3 10*3/uL (ref 1.7–7.7)
Neutrophils Relative %: 51 %
Platelet Count: 150 10*3/uL (ref 150–400)
RBC: 3.61 MIL/uL — ABNORMAL LOW (ref 4.22–5.81)
RDW: 12.5 % (ref 11.5–15.5)
WBC Count: 4.4 10*3/uL (ref 4.0–10.5)
nRBC: 0 % (ref 0.0–0.2)

## 2022-10-02 LAB — CORTISOL: Cortisol, Plasma: 4.1 ug/dL

## 2022-10-02 LAB — GLUCOSE, CAPILLARY: Glucose-Capillary: 212 mg/dL — ABNORMAL HIGH (ref 70–99)

## 2022-10-02 LAB — MAGNESIUM: Magnesium: 1.3 mg/dL — ABNORMAL LOW (ref 1.7–2.4)

## 2022-10-02 MED ORDER — MELATONIN 5 MG PO TABS: 5.0000 mg | ORAL_TABLET | Freq: Every evening | ORAL | Status: DC | PRN

## 2022-10-02 MED ORDER — FOLIC ACID 1 MG PO TABS
1.0000 mg | ORAL_TABLET | Freq: Every day | ORAL | Status: DC
  Administered 2022-10-03 – 2022-10-07 (×5): 1 mg via ORAL
  Filled 2022-10-02 (×5): qty 1

## 2022-10-02 MED ORDER — PREDNISONE 5 MG PO TABS
7.5000 mg | ORAL_TABLET | Freq: Every day | ORAL | Status: DC
  Administered 2022-10-03 – 2022-10-07 (×5): 7.5 mg via ORAL
  Filled 2022-10-02 (×5): qty 2

## 2022-10-02 MED ORDER — POLYETHYLENE GLYCOL 3350 17 G PO PACK: 17.0000 g | PACK | Freq: Every day | ORAL | Status: DC | PRN

## 2022-10-02 MED ORDER — INSULIN ASPART 100 UNIT/ML IJ SOLN
0.0000 [IU] | Freq: Three times a day (TID) | INTRAMUSCULAR | Status: DC
  Administered 2022-10-03: 2 [IU] via SUBCUTANEOUS
  Administered 2022-10-03: 1 [IU] via SUBCUTANEOUS
  Administered 2022-10-04: 3 [IU] via SUBCUTANEOUS
  Administered 2022-10-04: 2 [IU] via SUBCUTANEOUS
  Administered 2022-10-05: 1 [IU] via SUBCUTANEOUS
  Administered 2022-10-05: 2 [IU] via SUBCUTANEOUS
  Administered 2022-10-05: 1 [IU] via SUBCUTANEOUS
  Administered 2022-10-06 (×2): 2 [IU] via SUBCUTANEOUS
  Administered 2022-10-07: 1 [IU] via SUBCUTANEOUS
  Filled 2022-10-02: qty 0.09

## 2022-10-02 MED ORDER — ADULT MULTIVITAMIN W/MINERALS CH
1.0000 | ORAL_TABLET | Freq: Every day | ORAL | Status: DC
  Administered 2022-10-03 – 2022-10-07 (×5): 1 via ORAL
  Filled 2022-10-02 (×5): qty 1

## 2022-10-02 MED ORDER — MAGNESIUM SULFATE 2 GM/50ML IV SOLN
2.0000 g | Freq: Once | INTRAVENOUS | Status: AC
  Administered 2022-10-02: 2 g via INTRAVENOUS
  Filled 2022-10-02: qty 50

## 2022-10-02 MED ORDER — PROCHLORPERAZINE EDISYLATE 10 MG/2ML IJ SOLN: 5.0000 mg | Freq: Four times a day (QID) | INTRAMUSCULAR | Status: DC | PRN

## 2022-10-02 MED ORDER — PANTOPRAZOLE SODIUM 40 MG PO TBEC
40.0000 mg | DELAYED_RELEASE_TABLET | Freq: Every day | ORAL | Status: DC
  Administered 2022-10-03 – 2022-10-07 (×5): 40 mg via ORAL
  Filled 2022-10-02 (×5): qty 1

## 2022-10-02 MED ORDER — HYDROCORTISONE SOD SUC (PF) 100 MG IJ SOLR
60.0000 mg | Freq: Once | INTRAMUSCULAR | Status: AC
  Administered 2022-10-02: 60 mg via INTRAVENOUS
  Filled 2022-10-02: qty 2

## 2022-10-02 MED ORDER — SIMVASTATIN 40 MG PO TABS
40.0000 mg | ORAL_TABLET | Freq: Every day | ORAL | Status: DC
  Administered 2022-10-03 – 2022-10-06 (×4): 40 mg via ORAL
  Filled 2022-10-02: qty 1
  Filled 2022-10-02 (×2): qty 2
  Filled 2022-10-02: qty 1

## 2022-10-02 MED ORDER — SODIUM CHLORIDE 0.9 % IV SOLN: INTRAVENOUS | Status: DC

## 2022-10-02 MED ORDER — HYDROCODONE-ACETAMINOPHEN 10-325 MG PO TABS
1.0000 | ORAL_TABLET | Freq: Two times a day (BID) | ORAL | Status: DC | PRN
  Administered 2022-10-03 – 2022-10-07 (×8): 1 via ORAL
  Filled 2022-10-02 (×8): qty 1

## 2022-10-02 MED ORDER — ACETAMINOPHEN 325 MG PO TABS: 650.0000 mg | ORAL_TABLET | Freq: Four times a day (QID) | ORAL | Status: DC | PRN

## 2022-10-02 MED ORDER — ENSURE ENLIVE PO LIQD
237.0000 mL | Freq: Two times a day (BID) | ORAL | Status: DC
  Administered 2022-10-05 – 2022-10-07 (×2): 237 mL via ORAL

## 2022-10-02 MED ORDER — INSULIN ASPART 100 UNIT/ML IJ SOLN
0.0000 [IU] | Freq: Every day | INTRAMUSCULAR | Status: DC
  Administered 2022-10-02: 2 [IU] via SUBCUTANEOUS
  Filled 2022-10-02: qty 0.05

## 2022-10-02 MED ORDER — SODIUM CHLORIDE 0.9 % IV SOLN: Freq: Once | INTRAVENOUS | Status: AC

## 2022-10-02 MED ORDER — ASPIRIN 81 MG PO TBEC
81.0000 mg | DELAYED_RELEASE_TABLET | Freq: Every morning | ORAL | Status: DC
  Administered 2022-10-03 – 2022-10-07 (×5): 81 mg via ORAL
  Filled 2022-10-02 (×5): qty 1

## 2022-10-02 MED ORDER — ENOXAPARIN SODIUM 40 MG/0.4ML IJ SOSY
40.0000 mg | PREFILLED_SYRINGE | INTRAMUSCULAR | Status: DC
  Administered 2022-10-02 – 2022-10-06 (×5): 40 mg via SUBCUTANEOUS
  Filled 2022-10-02 (×5): qty 0.4

## 2022-10-02 NOTE — Progress Notes (Signed)
Symptom Management Consult Note Brandon Rubio    Patient Care Team: Brandon Broker, MD as PCP - General (Internal Medicine) Brandon Fee, MD as Attending Physician (Gastroenterology) Brandon Rubio, Brandon Rubio, [facility] (Inactive) as Pharmacist (Pharmacist) Brandon Gaul, MD as Consulting Physician (Oncology) Brandon Pavlov, MD as Consulting Physician (Internal Medicine) Brandon Flow, MD as Consulting Physician (Ophthalmology)    Name / MRN / DOB: Brandon Rubio  [id number]  January 09, 1951   Date of visit: 10/02/2022   Chief Complaint/Reason for visit: poor PO intake, dizziness   Current Therapy: carboplatin, Rande Lawman, alimta  Last treatment:  Day 1   Cycle 7 on 09/23/22   ASSESSMENT & PLAN: Patient is a 72 y.o. male  with oncologic history of recurrent and metastatic non-small cell lung cancer, adenocarcinoma followed by Brandon Rubio.  I have viewed most recent oncology note and lab work.    #Recurrent and metastatic non-small cell lung cancer, adenocarcinoma  - Next appointment with oncologist is 10/13/22   #Symptom management: Dizziness, fatigue, weight loss -Patient reporting profound fatigue, has been 6 days in bed, has lost 13 pounds in x 9 days. -Exam patient clinically appears dehydrated.  No focal weakness appreciated. -Blood pressure WNL at 117/87, tachycardic to 116 at rest.  With standing tachycardia worsens to 134.  Patient received 1L NS and when vital signs rechecked patient is 110 at rest and 128 with standing. No improvement in dizziness. -Labs today with low magnesium at 1.3.  Patient received 2 g IV mag in clinic. -He does not feel as if he could manage his symptoms at home and is very worried about adrenal insufficiency contributing to his symptoms.  CMP also with metabolic acidosis with bicarb of 17 and anion gap of 18.  Normal renal function.  CBC showing stable anemia. -Discussed case with Brandon Rubio who agrees with plan for ED  evaluation and likely admission for ongoing symptom management. Unfortunately patient is unstable to admit from clinic with tachycardia. -Report given to ED RN and accepting APP.   Heme/Onc History: Oncology History  Adenocarcinoma of right lung, stage 3 (HCC)  12/24/2020 Initial Diagnosis   Adenocarcinoma of right lung, stage 3 (HCC)   12/24/2020 Cancer Staging   Staging form: Lung, AJCC 8th Edition - Clinical: Stage IIIA (cT4, cN0, cM0) - Signed by Brandon Gaul, MD on 12/24/2020   01/24/2021 - 02/14/2021 Chemotherapy   Patient is on Treatment Plan : LUNG NSCLC Neoadjuvant Pemetrexed + Carboplatin q21d x 3 Cycles     04/01/2021 - 04/22/2021 Chemotherapy   Patient is on Treatment Plan : LUNG Carboplatin / Paclitaxel + XRT q7d     07/08/2021 - 02/18/2022 Chemotherapy   Patient is on Treatment Plan : LUNG NSCLC Durvalumab (1500) q28d     05/13/2022 -  Chemotherapy   Patient is on Treatment Plan : LUNG Carboplatin (5) + Pemetrexed (500) + Pembrolizumab (200) D1 q21d Induction x 4 cycles / Maintenance Pemetrexed (500) + Pembrolizumab (200) D1 q21d         Interval history-: Brandon Nou. is a 72 y.o. male with oncologic history as above presenting to [facility] today with chief complaint of fatigue, dizziness and weight loss.  He is accompanied by his sister who provides additional history.  Patient states symptoms have been ongoing x 1 week.  They started 2 days after his last treatment.  Patient states that is normal for him to feel nauseous and fatigued after treatment however this time his symptoms are  different.  The symptoms are very similar to when he was admitted in the past for adrenal insufficiency.  Patient last saw endocrinologist Brandon Rubio 09/22/2022.  Plan per chart review was to: alternate between 7.5 and 5 m every other day, and then reducing the dose to 5 mg daily if he feels well.  Patient wonders if he missed any doses of his prednisone because that is how he felt in the  past when this happened.  He thinks he has been taking his medications as prescribed however is not confident.  Patient has had little to no p.o. intake in the last x 6 days.  He states he has eaten no food, may be consumed a total of 20 ounces of fluids.  Patient states he has just been so fatigued and had no strength or energy to get out of bed.  His sister has been checking on him frequently and encouraging him to seek evaluation which she did not agree to do until today.  Has not had any fever or infectious symptoms that he can recall.    ROS  All other systems are reviewed and are negative for acute change except as noted in the HPI.    Allergies  Allergen Reactions   Penicillins Anaphylaxis and Swelling    THE THROAT SWELLS!!!! 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   Peripheral neuropathy    toes     Past Surgical History:  Procedure Laterality Date   BRONCHIAL BIOPSY  12/10/2020   Procedure: BRONCHIAL BIOPSIES;  Surgeon: Leslye Peer, MD;  Location: [facility] ENDOSCOPY;  Service: Pulmonary;;   BRONCHIAL BRUSHINGS  12/10/2020   Procedure: BRONCHIAL BRUSHINGS;  Surgeon: Leslye Peer, MD;  Location: [facility] ENDOSCOPY;  Service: Pulmonary;;   BRONCHIAL NEEDLE ASPIRATION BIOPSY  12/10/2020   Procedure: BRONCHIAL NEEDLE ASPIRATION BIOPSIES;  Surgeon: Leslye Peer, MD;  Location: [facility] ENDOSCOPY;  Service: Pulmonary;;   COLONOSCOPY     COLONOSCOPY W/ POLYPECTOMY  03/10/2009   LARYNGOSCOPY  03/10/1997   VIDEO  BRONCHOSCOPY WITH ENDOBRONCHIAL NAVIGATION N/A 12/10/2020   Procedure: ROBOTIC VIDEO BRONCHOSCOPY WITH ENDOBRONCHIAL NAVIGATION;  Surgeon: Leslye Peer, MD;  Location: [facility] ENDOSCOPY;  Service: Pulmonary;  Laterality: N/A;    Social History   Socioeconomic History   Marital status: Divorced    Spouse name: Not on file   Number of children: 0   Years of education: Not on file   Highest education Rubio: Not on file  Occupational History   Occupation: Retired Personnel officer  Tobacco Use   Smoking status: Every Day    Current packs/day: 1.50    Average packs/day: 1.5 packs/day for 58.0 years (87.0 ttl pk-yrs)    Types: Cigarettes   Smokeless tobacco: Never   Tobacco comments:    2+ packs smoked daily ARJ 12/05/20  Vaping Use   Vaping status: Never Used  Substance and Sexual Activity   Alcohol use: No    Alcohol/week:  0.0 standard drinks of alcohol    Comment:  none since 04/2012   Drug use: Never   Sexual activity: Not on file  Other Topics Concern   Not on file  Social History Narrative   REG EXERCISE         Social Determinants of Health   Financial Resource Strain: Low Risk  (09/15/2022)   Overall Financial Resource Strain (CARDIA)    Difficulty of Paying Living Expenses: Not hard at all  Food Insecurity: No Food Insecurity (09/15/2022)   Hunger Vital Sign    Worried About Running Out of Food in the Last Year: Never true    Ran Out of Food in the Last Year: Never true  Transportation Needs: No Transportation Needs (09/15/2022)   PRAPARE - Administrator, Civil Service (Medical): No    Lack of Transportation (Non-Medical): No  Physical Activity: Inactive (09/15/2022)   Exercise Vital Sign    Days of Exercise per Week: 0 days    Minutes of Exercise per Session: 0 min  Stress: No Stress Concern Present (09/15/2022)   [employer] of Occupational Health - Occupational Stress Questionnaire    Feeling of Stress : Not at all  Social Connections: Socially Isolated  (09/15/2022)   Social Connection and Isolation Panel [NHANES]    Frequency of Communication with Friends and Family: More than three times a week    Frequency of Social Gatherings with Friends and Family: Three times a week    Attends Religious Services: Never    Active Member of Clubs or Organizations: No    Attends Banker Meetings: Never    Marital Status: Divorced  Catering manager Violence: Not At Risk (09/15/2022)   Humiliation, Afraid, Rape, and Kick questionnaire    Fear of Current or Ex-Partner: No    Emotionally Abused: No    Physically Abused: No    Sexually Abused: No    Family History  Problem Relation Age of Onset   Lung cancer Mother        X 2   Colon polyps Father    Colon cancer Father 96   Coronary artery disease Father    Lung disease Father    Esophageal cancer Neg Hx    Stomach cancer Neg Hx    Rectal cancer Neg Hx      Current Outpatient Medications:    aspirin EC 81 MG tablet, Take 81 mg by mouth in the morning. Swallow whole., Disp: , Rfl:    bisacodyl (DULCOLAX) 5 MG EC tablet, Take 5 mg by mouth daily as needed for moderate constipation., Disp: , Rfl:    folic acid (FOLVITE) 1 MG tablet, TAKE 1 TABLET BY MOUTH EVERY DAY, Disp: 30 tablet, Rfl: 2   HYDROcodone-acetaminophen (NORCO) 10-325 MG tablet, Take 1 tablet by mouth 2 (two) times daily as needed., Disp: 60 tablet, Rfl: 0   HYDROcodone-acetaminophen (NORCO) 10-325 MG tablet, Take 1 tablet by mouth every 12 (twelve) hours as needed., Disp: 60 tablet, Rfl: 0   metFORMIN (GLUCOPHAGE) 1000 MG tablet, TAKE 1 TABLET(1000 MG) BY MOUTH TWICE DAILY WITH A MEAL, Disp: 180 tablet, Rfl: 1   Multiple Vitamins-Minerals (CENTRUM SILVER 50+MEN) TABS, Take 1 tablet by mouth daily with breakfast., Disp: , Rfl:    omeprazole (PRILOSEC) 20 MG capsule, TAKE 1 CAPSULE BY MOUTH TWICE DAILY BEFORE A MEAL, Disp: 180 capsule, Rfl: 1   ondansetron (ZOFRAN) 8 MG tablet, Take 1 tablet (8 mg total) by mouth every 8  (  eight) hours as needed for nausea or vomiting., Disp: 30 tablet, Rfl: 2   predniSONE (DELTASONE) 5 MG tablet, Take 1-1.5 tablets (5-7.5 mg total) by mouth daily with breakfast., Disp: 135 tablet, Rfl: 3   prochlorperazine (COMPAZINE) 10 MG tablet, Take 1 tablet (10 mg total) by mouth every 6 (six) hours as needed for nausea or vomiting., Disp: 60 tablet, Rfl: 1   simvastatin (ZOCOR) 40 MG tablet, TAKE 1 TABLET BY MOUTH EVERY EVENING AT 6 PM., Disp: 90 tablet, Rfl: 3  PHYSICAL EXAM: ECOG FS:3 - Symptomatic, >50% confined to bed    Vitals:   10/02/22 1251  BP: 117/87  Pulse: (!) 116  Resp: 18  Temp: (!) 97.5 F (36.4 C)  TempSrc: Oral  SpO2: 100%  Weight: 177 lb 9.6 oz (80.6 kg)   Physical Exam Vitals and nursing note reviewed.  Constitutional:      Appearance: He is ill-appearing (chronically ill appearing). He is not toxic-appearing.  HENT:     Head: Normocephalic.     Mouth/Throat:     Mouth: Mucous membranes are dry.  Eyes:     Conjunctiva/sclera: Conjunctivae normal.     Comments: No nystagmus  Cardiovascular:     Rate and Rhythm: Regular rhythm. Tachycardia present.     Pulses: Normal pulses.     Heart sounds: Normal heart sounds.  Pulmonary:     Effort: Pulmonary effort is normal.     Breath sounds: Normal breath sounds.  Abdominal:     General: There is no distension.  Musculoskeletal:     Cervical back: Normal range of motion.  Skin:    General: Skin is warm and dry.     Coloration: Skin is pale.  Neurological:     Mental Status: He is alert and oriented to person, place, and time.     Comments: Speech is clear and goal oriented, follows commands CN III-XII intact, no facial droop Normal strength in upper and lower extremities bilaterally including dorsiflexion and plantar flexion, strong and equal grip strength Sensation normal to light and sharp touch Moves extremities without ataxia, coordination intact Normal finger to nose and rapid alternating  movements  Per patient unable to ambulate because of dizziness.         LABORATORY DATA: I have reviewed the data as listed    Latest Ref Rng & Units 10/02/2022   12:55 PM 09/23/2022    8:42 AM 09/01/2022    9:50 AM  CBC  WBC 4.0 - 10.5 K/uL 4.4  9.4  10.3   Hemoglobin 13.0 - 17.0 g/dL 02.5  85.2  77.8   Hematocrit 39.0 - 52.0 % 33.8  34.8  31.2   Platelets 150 - 400 K/uL 150  192  179         Latest Ref Rng & Units 10/02/2022   12:55 PM 09/23/2022    8:42 AM 09/01/2022    9:50 AM  CMP  Glucose 70 - 99 mg/dL 242  353  614   BUN 8 - 23 mg/dL 24  12  9    Creatinine 0.61 - 1.24 mg/dL 4.31  5.40  0.86   Sodium 135 - 145 mmol/L 134  138  137   Potassium 3.5 - 5.1 mmol/L 3.7  3.8  4.0   Chloride 98 - 111 mmol/L 99  104  104   CO2 22 - 32 mmol/L 17  22  23    Calcium 8.9 - 10.3 mg/dL 9.8  9.6  8.7  Total Protein 6.5 - 8.1 g/dL 7.7  7.6  6.5   Total Bilirubin 0.3 - 1.2 mg/dL 0.5  0.4  0.4   Alkaline Phos 38 - 126 U/L 62  65  50   AST 15 - 41 U/L 29  28  27    ALT 0 - 44 U/L 22  30  24         RADIOGRAPHIC STUDIES (from last 24 hours if applicable) I have personally reviewed the radiological images as listed and agreed with the findings in the report. No results found.      Visit Diagnosis: 1. Adenocarcinoma of right lung, stage 3 (HCC)   2. Weakness   3. Dizziness   4. Tachycardia   5. At risk for dehydration due to poor fluid intake      No orders of the defined types were placed in this encounter.   All questions were answered. The patient knows to call the clinic with any problems, questions or concerns. No barriers to learning was detected.  A total of more than 30 minutes were spent on this encounter with face-to-face time and non-face-to-face time, including preparing to see the patient, ordering tests and/or medications, counseling the patient and coordination of care as outlined above.    Thank you for allowing me to participate in the care of this  patient.    Shanon Ace, PA-C Department of Hematology/Oncology [facility] Phone: [phone]  [phone]    10/02/2022 3:57 PM

## 2022-10-02 NOTE — H&P (Signed)
History and Physical  Brandon Rubio. NGE:[id number] DOB: 01-30-1951 DOA: 10/02/2022  Referring physician: [name], PA-EDP  PCP: [name], MD  Outpatient Specialists: Medical oncology. Patient coming from: Home through the cancer center.  Chief Complaint: Generalized weakness, shortness of breath.  HPI: Brandon Rubio. is a 72 y.o. 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  Has had poor oral intake for the past week.  No reported subjective fevers or chills.  Has been difficult to ambulate due to progressive dyspnea for the past few days.  Last chemotherapy was on 09/24/2022.  No reported chest pain.  The patient continues to smoke and has cut down on the quantity of cigarettes use.  The patient received IV fluid and IV magnesium at the cancer center prior to being sent to the ED.  In the ED, tachycardic and tachypneic without hypoxia.  Lab studies notable for electrolyte derangement and hyperglycemia.  UA negative for pyuria.  Chest x-ray nonacute.  EDP requested admission due to failure to thrive.  The patient was admitted by [facility], hospitalist service, as observation status.  ED Course: Temperature 98.1.  BP 112/73, pulse 101, respiration rate 26, O2 saturation 99% on room air.  Lab studies remarkable for, serum sodium 134, serum bicarb 17, glucose 144, BUN 24, creatinine 1.19 with GFR greater than 60.  Anion gap 15.  Magnesium 1.3.  Review of Systems: Review of systems as noted in the HPI. All other systems reviewed and are negative.   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   Peripheral neuropathy    toes   Past Surgical History:  Procedure Laterality Date   BRONCHIAL BIOPSY  12/10/2020   Procedure: BRONCHIAL BIOPSIES;  Surgeon: Leslye Peer, MD;  Location: [facility] ENDOSCOPY;  Service: Pulmonary;;   BRONCHIAL BRUSHINGS  12/10/2020   Procedure: BRONCHIAL BRUSHINGS;  Surgeon: Leslye Peer, MD;  Location: [facility] ENDOSCOPY;  Service: Pulmonary;;   BRONCHIAL NEEDLE ASPIRATION BIOPSY  12/10/2020   Procedure: BRONCHIAL NEEDLE ASPIRATION BIOPSIES;  Surgeon: Leslye Peer, MD;  Location: [facility] ENDOSCOPY;  Service: Pulmonary;;   COLONOSCOPY     COLONOSCOPY W/ POLYPECTOMY  03/10/2009   LARYNGOSCOPY  03/10/1997   VIDEO BRONCHOSCOPY WITH ENDOBRONCHIAL NAVIGATION N/A 12/10/2020   Procedure: ROBOTIC VIDEO BRONCHOSCOPY WITH ENDOBRONCHIAL NAVIGATION;  Surgeon: Leslye Peer, MD;  Location: [facility] ENDOSCOPY;  Service: Pulmonary;  Laterality: N/A;    Social History:  reports that he has been smoking cigarettes. He has a 87 pack-year smoking history. He has never used smokeless tobacco. He reports that he does not drink alcohol and does not use drugs.   Allergies  Allergen Reactions   Penicillins Anaphylaxis and Swelling    THE THROAT SWELLS!!!! Because of a history of documented adverse serious drug reaction;[name] Alert bracelet  is recommended   Albumin (Human) Hives and Itching  Family History  Problem Relation Age of Onset   Lung cancer Mother        X 2   Colon polyps Father    Colon cancer Father 55   Coronary artery disease Father    Lung disease Father    Esophageal cancer Neg Hx    Stomach cancer Neg Hx    Rectal cancer Neg Hx       Prior to Admission medications   Medication Sig Start Date  End Date Taking? Authorizing Provider  aspirin EC 81 MG tablet Take 81 mg by mouth in the morning. Swallow whole.    [provider]  bisacodyl (DULCOLAX) 5 MG EC tablet Take 5 mg by mouth daily as needed for moderate constipation.    [provider]  folic acid (FOLVITE) 1 MG tablet TAKE 1 TABLET BY MOUTH EVERY DAY 09/27/22   [name], MD  HYDROcodone-acetaminophen [facility]) 10-325 MG tablet Take 1 tablet by mouth 2 (two) times daily as needed. 08/25/22   [name], MD  HYDROcodone-acetaminophen (NORCO) 10-325 MG tablet Take 1 tablet by mouth every 12 (twelve) hours as needed. 09/16/22   Crain, Whitney L, PA  metFORMIN (GLUCOPHAGE) 1000 MG tablet TAKE 1 TABLET(1000 MG) BY MOUTH TWICE DAILY WITH A MEAL 06/20/22   [name], MD  Multiple Vitamins-Minerals (CENTRUM SILVER 50+MEN) TABS Take 1 tablet by mouth daily with breakfast.    [provider]  omeprazole (PRILOSEC) 20 MG capsule TAKE 1 CAPSULE BY MOUTH TWICE DAILY BEFORE A MEAL 08/21/22   [name], MD  ondansetron (ZOFRAN) 8 MG tablet Take 1 tablet (8 mg total) by mouth every 8 (eight) hours as needed for nausea or vomiting. 06/09/22   Heilingoetter, [name], PA-C  predniSONE (DELTASONE) 5 MG tablet Take 1-1.5 tablets (5-7.5 mg total) by mouth daily with breakfast. 09/22/22   [name], MD  prochlorperazine (COMPAZINE) 10 MG tablet Take 1 tablet (10 mg total) by mouth every 6 (six) hours as needed for nausea or vomiting. 09/01/22 10/31/22  [name], MD  simvastatin (ZOCOR) 40 MG tablet TAKE 1 TABLET BY MOUTH EVERY EVENING AT 6 PM. 09/12/22   [name], MD    Physical Exam: BP 112/73   Pulse 100   Temp 98.1 F (36.7 C) (Oral)   Resp 19   Ht 6' (1.829 m)   Wt 80.5 kg   SpO2 98%   BMI 24.07 kg/m   General: 72 y.o. year-old male frail-appearing in no acute distress.  Alert and oriented x3. Cardiovascular: Regular rate and rhythm with no rubs or  gallops.  No thyromegaly or JVD noted.  No lower extremity edema. 2/4 pulses in all 4 extremities. Respiratory: Mild rales at bases with no wheezing noted.  Poor inspiratory effort. Abdomen: Soft nontender nondistended with normal bowel sounds x4 quadrants. Muskuloskeletal: No cyanosis, clubbing or edema noted bilaterally Neuro: CN II-XII intact, strength, sensation, reflexes Skin: No ulcerative lesions noted or rashes Psychiatry: Judgement and insight appear normal. Mood is appropriate for condition and setting          Labs on Admission:  Basic Metabolic Panel: Recent Labs  Lab 10/02/22 1255  NA 134*  K 3.7  CL 99  CO2 17*  GLUCOSE 144*  BUN 24*  CREATININE 1.19  CALCIUM 9.8  MG 1.3*   Liver Function Tests: Recent Labs  Lab 10/02/22 1255  AST 29  ALT 22  ALKPHOS 62  BILITOT 0.5  PROT 7.7  ALBUMIN 4.1   No results for input(s): "LIPASE", "AMYLASE" in the last 168 hours. No results for input(s): "AMMONIA" in the last 168 hours. CBC: Recent Labs  Lab 10/02/22 1255  WBC 4.4  NEUTROABS 2.3  HGB 12.0*  HCT 33.8*  MCV 93.6  PLT 150   Cardiac Enzymes: No results for input(s): "CKTOTAL", "CKMB", "CKMBINDEX", "TROPONINI" in the last 168 hours.  BNP (last 3 results) No results for input(s): "BNP" in the last 8760 hours.  ProBNP (last 3 results) No results for input(s): "PROBNP" in the last 8760 hours.  CBG: No results for input(s): "GLUCAP" in the last 168 hours.  Radiological Exams on Admission: DG Chest 2 View  Result Date: 10/02/2022 CLINICAL DATA:  Weakness EXAM: CHEST - 2 VIEW COMPARISON:  CT chest 08/29/2022 and radiograph 05/24/2021 FINDINGS: Stable cardiomediastinal silhouette given patient rotation. Aortic atherosclerotic calcification. Right midlung scarring/atelectasis. Nodular opacities in the left mid and lower lung better assessed on recent CT chest 08/29/2022. No pleural effusion or pneumothorax. No displaced rib fractures. IMPRESSION: Nodular  opacities in the left mid and lower lung are grossly similar to CT 08/29/2022. Electronically Signed   By: Minerva Fester M.D.   On: 10/02/2022 16:48    EKG: I independently viewed the EKG done and my findings are as followed: Sinus rhythm rate of 97.  Nonspecific ST changes.  QTc 440.  Assessment/Plan Present on Admission:  Failure to thrive in adult  Principal Problem:   Failure to thrive in adult  Failure to thrive in an adult in the setting of recurrent metastatic non-small cell lung cancer, adenocarcinoma Encourage oral protein calorie intake Dietitian consult IV fluid hydration PT OT evaluation Fall precautions.  Hypovolemic hyponatremia, mild Serum sodium 134 NS at 50 cc/h x 1 day Encourage increase oral protein calorie intake as tolerated. Repeat BMP in the morning  Hypomagnesemia Serum magnesium 1.3 Repleted intravenously. Repeat magnesium level in the morning.  High anion gap metabolic acidosis in the setting of malignancy Serum bicarb 17, anion gap 18 IV fluid hydration Repeat BMP in the morning.  Type 2 diabetes with hyperglycemia Last hemoglobin A1c 6.8 on 03/24/2022 The patient requested a regular diet, agree with liberalizing his diet. Insulin sliding scale  GERD Resume home PPI  Hyperlipidemia Resume home simvastatin  Recurrent metastatic non-small cell lung cancer, adenocarcinoma Currently on palliative chemotherapy, last chemotherapy 09/24/2022.  Adrenal insufficiency Resume home regimen  Tobacco use disorder The patient continues to smoke Has cut down on tobacco use, smokes 2 to 3 cigarettes/day Declined Chantix and nicotine patch for tobacco cessation, wants to quit cold [place].   Time: 75 minutes.    DVT prophylaxis: Subcu Lovenox daily  Code Status: Full code, per the patient himself.  Family Communication: None at bedside  Disposition Plan: Admitted to telemetry unit  Consults called: PT OT, dietitian  Admission status:  Observation status   Status is: Observation    Darlin Drop MD [facility] Pager [phone]  If 7PM-7AM, please contact night-coverage [email or web address] Password [facility]  10/02/2022, 7:40 PM

## 2022-10-02 NOTE — Patient Instructions (Signed)

## 2022-10-02 NOTE — Telephone Encounter (Signed)
Pt not doing well. Pt's sister Brandon Rubio called office to report that pt has been extremely weak for the past several days and has barely been out of bed. She also states that he hasn't eaten in 5 days. TC to patient. Brandon Rubio confirms these symptoms and also says he has intermittent SOB. He also says he is dizzy upon standing and has intermittent cough. Denies fever and N/V. Discussed w/ Dr. Arbutus Ped who recommends pt be seen by [facility] Appt scheduled at 1230 w/ [facility] and sister made aware. Advised to call EMS and have pt transported to the ED if she is unable to safely transport him to appt.

## 2022-10-02 NOTE — ED Triage Notes (Signed)
Pt to ed from cancer center c/o fatigue, loss of appetite and tachycardia.

## 2022-10-02 NOTE — ED Notes (Signed)
ED TO INPATIENT HANDOFF REPORT  ED Nurse Name and Phone #: Zorita Pang [phone]   S Name/Age/Gender Brandon Rubio. 72 y.o. male Room/Bed: WA13/WA13  Code Status   Code Status: Full Code  Home/SNF/Other Home Patient oriented to: self, place, time, and situation Is this baseline? Yes   Triage Complete: Triage complete  Chief Complaint Failure to thrive in adult [R62.7]  Triage Note Pt to ed from cancer center c/o fatigue, loss of appetite and tachycardia.   Allergies Allergies  Allergen Reactions   Penicillins Anaphylaxis and Swelling    THE THROAT SWELLS!!!! Because of a history of documented adverse serious drug reaction;[name] Alert bracelet  is recommended   Albumin (Human) Hives and Itching    Level of Care/Admitting Diagnosis ED Disposition     ED Disposition  Admit   Condition  --   Comment  Hospital Area: [facility] [[id number]]  Level of Care: Telemetry [5]  Admit to tele based on following criteria: Other see comments  Comments: high risk  May place patient in observation at [facility] or [facility] if equivalent level of care is available:: No  Covid Evaluation: Asymptomatic - no recent exposure (last 10 days) testing not required  Diagnosis: Failure to thrive in adult [358490]  Admitting Physician: Darlin Drop [[id number]]  Attending Physician: Darlin Drop [[id number]]          B Medical/Surgery History Past Medical History:  Diagnosis Date   CAD (coronary artery disease)    Cancer (HCC)    SKIN.[name]   Cataract    Diabetes mellitus    2   DVT (deep venous thrombosis) (HCC) 03/30/2001   RLE DVT, post ankle fracture   GERD (gastroesophageal reflux disease)    Hyperlipidemia    Hypertension    Lung cancer (HCC) 12/10/2020   Myocardial infarct, 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   Peripheral neuropathy    toes   Past Surgical History:  Procedure Laterality Date   BRONCHIAL BIOPSY  12/10/2020   Procedure: BRONCHIAL  BIOPSIES;  Surgeon: Leslye Peer, MD;  Location: [facility] ENDOSCOPY;  Service: Pulmonary;;   BRONCHIAL BRUSHINGS  12/10/2020   Procedure: BRONCHIAL BRUSHINGS;  Surgeon: Leslye Peer, MD;  Location: [facility] ENDOSCOPY;  Service: Pulmonary;;   BRONCHIAL NEEDLE ASPIRATION BIOPSY  12/10/2020   Procedure: BRONCHIAL NEEDLE ASPIRATION BIOPSIES;  Surgeon: Leslye Peer, MD;  Location: [facility] ENDOSCOPY;  Service: Pulmonary;;   COLONOSCOPY     COLONOSCOPY W/ POLYPECTOMY  03/10/2009   LARYNGOSCOPY  03/10/1997   VIDEO BRONCHOSCOPY WITH ENDOBRONCHIAL NAVIGATION N/A 12/10/2020   Procedure: ROBOTIC VIDEO BRONCHOSCOPY WITH ENDOBRONCHIAL NAVIGATION;  Surgeon: Leslye Peer, MD;  Location: [facility] ENDOSCOPY;  Service: Pulmonary;  Laterality: N/A;     A IV Location/Drains/Wounds Patient Lines/Drains/Airways Status     Active Line/Drains/Airways     Name Placement date Placement time Site Days   Peripheral IV 10/02/22 22 G Anterior;Right Forearm 10/02/22  1308  Forearm  less than 1            Intake/Output Last 24 hours No intake or output data in the 24 hours ending 10/02/22 1951  Labs/Imaging Results for orders placed or performed during the hospital encounter of 10/02/22 (from the past 48 hour(s))  Troponin I (High Sensitivity)     Status: None   Collection Time: 10/02/22  4:40 PM  Result Value Ref Range   Troponin I (High Sensitivity) 7 <18 ng/L    Comment: (NOTE) Elevated high sensitivity troponin  I (hsTnI) values and significant  changes across serial measurements may suggest ACS but many other  chronic and acute conditions are known to elevate hsTnI results.  Refer to the "Links" section for chest pain algorithms and additional  guidance. Performed at [facility], [address] [place], [place] [address]   Urinalysis, Routine w reflex microscopic -Urine, Clean Catch     Status: Abnormal   Collection Time: 10/02/22  5:16 PM  Result Value Ref Range   Color, Urine YELLOW  YELLOW   APPearance CLEAR CLEAR   Specific Gravity, Urine 1.019 1.005 - 1.030   pH 5.0 5.0 - 8.0   Glucose, UA NEGATIVE NEGATIVE mg/dL   Hgb urine dipstick NEGATIVE NEGATIVE   Bilirubin Urine NEGATIVE NEGATIVE   Ketones, ur 80 (A) NEGATIVE mg/dL   Protein, ur 30 (A) NEGATIVE mg/dL   Nitrite NEGATIVE NEGATIVE   Leukocytes,Ua NEGATIVE NEGATIVE   RBC / HPF 0-5 0 - 5 RBC/hpf   WBC, UA 0-5 0 - 5 WBC/hpf   Bacteria, UA NONE SEEN NONE SEEN   Squamous Epithelial / HPF 0-5 0 - 5 /HPF   Mucus PRESENT    Hyaline Casts, UA PRESENT     Comment: Performed at [facility], [address], [place], [place] [address]   DG Chest 2 View  Result Date: 10/02/2022 CLINICAL DATA:  Weakness EXAM: CHEST - 2 VIEW COMPARISON:  CT chest 08/29/2022 and radiograph 05/24/2021 FINDINGS: Stable cardiomediastinal silhouette given patient rotation. Aortic atherosclerotic calcification. Right midlung scarring/atelectasis. Nodular opacities in the left mid and lower lung better assessed on recent CT chest 08/29/2022. No pleural effusion or pneumothorax. No displaced rib fractures. IMPRESSION: Nodular opacities in the left mid and lower lung are grossly similar to CT 08/29/2022. Electronically Signed   By: Minerva Fester M.D.   On: 10/02/2022 16:48    Pending Labs Unresulted Labs (From admission, onward)     Start     Ordered   10/09/22 0500  Creatinine, serum  (enoxaparin (LOVENOX)    CrCl >/= 30 ml/min)  Weekly,   R     Comments: while on enoxaparin therapy    10/02/22 1938   10/02/22 1940  Hemoglobin A1c  Once,   R       Comments: To assess prior glycemic control    10/02/22 1940   10/02/22 1938  CBC  (enoxaparin (LOVENOX)    CrCl >/= 30 ml/min)  Once,   R       Comments: Baseline for enoxaparin therapy IF NOT ALREADY DRAWN.  Notify MD if PLT < 100 K.    10/02/22 1938   10/02/22 1938  Creatinine, serum  (enoxaparin (LOVENOX)    CrCl >/= 30 ml/min)  Once,   R       Comments: Baseline for  enoxaparin therapy IF NOT ALREADY DRAWN.    10/02/22 1938   10/02/22 1643  Cortisol  Once,   URGENT        10/02/22 1642   10/02/22 1607  Brain natriuretic peptide  Once,   URGENT        10/02/22 1606            Vitals/Pain Today's Vitals   10/02/22 1543 10/02/22 1544 10/02/22 1800 10/02/22 1850  BP:  (!) 136/90 112/73   Pulse:  93 100   Resp:  18 19   Temp:  (!) 97.5 F (36.4 C)  98.1 F (36.7 C)  TempSrc:  Oral  Oral  SpO2:  99% 98%   Weight: 177 lb 7.5 oz (80.5 kg)     Height: 6' (1.829 m)     PainSc: 0-No pain       Isolation Precautions No active isolations  Medications Medications  enoxaparin (LOVENOX) injection 40 mg (has no administration in time range)  0.9 %  sodium chloride infusion (has no administration in time range)  insulin aspart (novoLOG) injection 0-9 Units (has no administration in time range)  insulin aspart (novoLOG) injection 0-5 Units (has no administration in time range)  aspirin EC tablet 81 mg (has no administration in time range)  folic acid (FOLVITE) tablet 1 mg (has no administration in time range)  HYDROcodone-acetaminophen (NORCO) 10-325 MG per tablet 1 tablet (has no administration in time range)  Centrum Silver 50+Men TABS 1 tablet (has no administration in time range)  pantoprazole (PROTONIX) EC tablet 40 mg (has no administration in time range)  predniSONE (DELTASONE) tablet 5-7.5 mg (has no administration in time range)  simvastatin (ZOCOR) tablet 40 mg (has no administration in time range)  hydrocortisone sodium succinate (SOLU-CORTEF) 100 MG injection 60 mg (60 mg Intravenous Given 10/02/22 1757)    Mobility Has been needing assistance with wheelchair when needing to ambulate. Normally gets around at home good with no devices or assistance.      Focused Assessments Patient is Alert and oriented x4. Patient usually gets around at home good, but with the new onset of weakness he has needed assistance when getting up and  ambulating.    R Recommendations: See Admitting Provider Note  Report given to:   Additional Notes:

## 2022-10-02 NOTE — ED Provider Notes (Signed)
[place] EMERGENCY DEPARTMENT AT [facility] Provider Note   CSN: [id number] Arrival date & time: 10/02/22  1531     History Non small cell lung cancer Stage IIIA (T4,n0M0) on palliative chemotherapy.  Chief Complaint  Patient presents with   Weakness    Brandon Rubio. is a 72 y.o. male.  72 year old male with a past medical history of Stage IIIA (T4, N0, M0) non-small cell lung cancer, favoring adenocarcinoma presented with large right lower lobe lung mass with no mediastinal lymphadenopathy or extrathoracic metastasis diagnosed in October 2022 presents to the ED from the cancer center for weakness. Patient reports feeling dizzy, lightheaded, weak for the past week.  Feels that he has had a decrease in oral intake and has not been getting out of his chair.  He endorses shortness of breath with any type of exertion.  Reports he gets very winded from walking from 1 place to another.  He also has a history of adrenal insufficiency, is on chronic steroids takes a total of 7 mg daily, he is unsure whether he missed 1 of these doses.  His last chemotherapy treatment was on 17 July, he thought he was likely having side effects to the chemotherapy, but reports he never recovered from this.  He received a liter bolus at the cancer center along with a round of magnesium without any improvement in his symptoms.  Denies any fevers, coughs, nausea, vomiting.  The history is provided by the patient.       Home Medications Prior to Admission medications   Medication Sig Start Date End Date Taking? Authorizing Provider  aspirin EC 81 MG tablet Take 81 mg by mouth in the morning. Swallow whole.    [provider]  bisacodyl (DULCOLAX) 5 MG EC tablet Take 5 mg by mouth daily as needed for moderate constipation.    [provider]  folic acid (FOLVITE) 1 MG tablet TAKE 1 TABLET BY MOUTH EVERY DAY 09/27/22   [name], MD  HYDROcodone-acetaminophen [facility])  10-325 MG tablet Take 1 tablet by mouth 2 (two) times daily as needed. 08/25/22   [name], MD  HYDROcodone-acetaminophen (NORCO) 10-325 MG tablet Take 1 tablet by mouth every 12 (twelve) hours as needed. 09/16/22   Crain, Whitney L, PA  metFORMIN (GLUCOPHAGE) 1000 MG tablet TAKE 1 TABLET(1000 MG) BY MOUTH TWICE DAILY WITH A MEAL 06/20/22   [name], MD  Multiple Vitamins-Minerals (CENTRUM SILVER 50+MEN) TABS Take 1 tablet by mouth daily with breakfast.    [provider]  omeprazole (PRILOSEC) 20 MG capsule TAKE 1 CAPSULE BY MOUTH TWICE DAILY BEFORE A MEAL 08/21/22   [name], MD  ondansetron (ZOFRAN) 8 MG tablet Take 1 tablet (8 mg total) by mouth every 8 (eight) hours as needed for nausea or vomiting. 06/09/22   Heilingoetter, [name], PA-C  predniSONE (DELTASONE) 5 MG tablet Take 1-1.5 tablets (5-7.5 mg total) by mouth daily with breakfast. 09/22/22   [name], MD  prochlorperazine (COMPAZINE) 10 MG tablet Take 1 tablet (10 mg total) by mouth every 6 (six) hours as needed for nausea or vomiting. 09/01/22 10/31/22  [name], MD  simvastatin (ZOCOR) 40 MG tablet TAKE 1 TABLET BY MOUTH EVERY EVENING AT 6 PM. 09/12/22   [name], MD      Allergies    Penicillins and Albumin (human)    Review of Systems   Review of Systems  Constitutional:  Negative for fever.  Respiratory:  Negative for shortness of breath.   Cardiovascular:  Negative for chest pain.  All other systems reviewed and are negative.   Physical Exam Updated Vital Signs BP 112/73   Pulse 100   Temp 98.1 F (36.7 C) (Oral)   Resp 19   Ht 6' (1.829 m)   Wt 80.5 kg   SpO2 98%   BMI 24.07 kg/m  Physical Exam Vitals and nursing note reviewed.  Constitutional:      Appearance: Normal appearance. He is ill-appearing.     Comments: Chronically ill appearing.   HENT:     Head: Normocephalic and atraumatic.     Mouth/Throat:     Mouth: Mucous membranes  are dry.  Eyes:     Pupils: Pupils are equal, round, and reactive to light.  Cardiovascular:     Rate and Rhythm: Normal rate.     Comments: No pitting edema, no calf tenderness BL.  Pulmonary:     Effort: Pulmonary effort is normal.     Comments: Diminished to auscultation.  Abdominal:     General: Abdomen is flat.     Palpations: Abdomen is soft.     Tenderness: There is no abdominal tenderness.     Comments: Soft non tender to palpation   Musculoskeletal:     Cervical back: Normal range of motion and neck supple.     Right lower leg: No edema.     Left lower leg: No edema.  Skin:    General: Skin is warm and dry.  Neurological:     Mental Status: He is alert and oriented to person, place, and time.     ED Results / Procedures / Treatments   Labs (all labs ordered are listed, but only abnormal results are displayed) Labs Reviewed  URINALYSIS, ROUTINE W REFLEX MICROSCOPIC - Abnormal; Notable for the following components:      Result Value   Ketones, ur 80 (*)    Protein, ur 30 (*)    All other components within normal limits  BRAIN NATRIURETIC PEPTIDE  CORTISOL  CBC  CREATININE, SERUM  HEMOGLOBIN A1C  TROPONIN I (HIGH SENSITIVITY)  TROPONIN I (HIGH SENSITIVITY)    EKG None  Radiology DG Chest 2 View  Result Date: 10/02/2022 CLINICAL DATA:  Weakness EXAM: CHEST - 2 VIEW COMPARISON:  CT chest 08/29/2022 and radiograph 05/24/2021 FINDINGS: Stable cardiomediastinal silhouette given patient rotation. Aortic atherosclerotic calcification. Right midlung scarring/atelectasis. Nodular opacities in the left mid and lower lung better assessed on recent CT chest 08/29/2022. No pleural effusion or pneumothorax. No displaced rib fractures. IMPRESSION: Nodular opacities in the left mid and lower lung are grossly similar to CT 08/29/2022. Electronically Signed   By: Minerva Fester M.D.   On: 10/02/2022 16:48    Procedures Procedures    Medications Ordered in ED Medications   enoxaparin (LOVENOX) injection 40 mg (has no administration in time range)  0.9 %  sodium chloride infusion (has no administration in time range)  insulin aspart (novoLOG) injection 0-9 Units (has no administration in time range)  insulin aspart (novoLOG) injection 0-5 Units (has no administration in time range)  hydrocortisone sodium succinate (SOLU-CORTEF) 100 MG injection 60 mg (60 mg Intravenous Given 10/02/22 1757)    ED Course/ Medical Decision Making/ A&P Clinical Course as of 10/02/22 1942  Thu Oct 02, 2022  1711 Stable  59 YOF with a chief complaint with generalized weakness x1 week. On 7.5mg  of prednisone daily.  CCX SOB/Malaise. [CC]  1723 Evaluated at  bedside.  No acute pathology on exam but patient is diffusely fatigued and suffering from myalgias.  He states that the last time he felt like this was when he was suffering from adrenal insufficiency and required 5 days of IV steroids. He states he does not feel safe at home because he feels he is going to fall due to lightheadedness and weakness. [CC]    Clinical Course User Index [CC] Glyn Ade, MD                             Medical Decision Making Amount and/or Complexity of Data Reviewed Labs: ordered. Radiology: ordered.  Risk Prescription drug management. Decision regarding hospitalization.  This patient presents to the ED for concern of weakness, this involves a number of treatment options, and is a complaint that carries with high  risk of complications and morbidity.  The differential diagnosis includes infection, post chemotherapy, versus PE.   Co morbidities: Discussed in HPI   Brief History:  See HPI.   EMR reviewed including pt PMHx, past surgical history and past visits to ER.   See HPI for more details   Lab Tests:  I ordered and independently interpreted labs.  The pertinent results include:   CBC obtained at the cancer center today with no leukocytosis.  Hemoglobin is 12 and stable  at this time denies any source of bleeding.  CMP with slight decrease in his sodium at 134, glucose is slightly elevated but he is on chronic steroids.  BUN is 24, he did receive 2 L of normal saline while at the cancer center.  Magnesium at 1.3, had magnesium replaced at his cancer center appointment as well.   Imaging Studies:  Xray of the chest: IMPRESSION:  Nodular opacities in the left mid and lower lung are grossly similar  to CT 08/29/2022.    Cardiac Monitoring:  The patient was maintained on a cardiac monitor.  I personally viewed and interpreted the cardiac monitored which showed an underlying rhythm of: NSR 97 EKG non-ischemic   Medicines ordered:  I ordered medication including solu-cortef  for adrenal crisis  Reevaluation of the patient after these medicines showed that the patient stayed the same I have reviewed the patients home medicines and have made adjustments as needed   Consults:  I requested consultation with endocrinology, unable to reach as no person is on call.   Reevaluation:  After the interventions noted above I re-evaluated patient and found that they have :improved   Social Determinants of Health:  The patient's social determinants of health were a factor in the care of this patient   Problem List / ED Course:  Patient with a prior history of non-small cell adenocarcinoma stage III currently on palliative care arriving from the cancer center due to generalized weakness for the past week.  Given 1 L bolus, also given magnesium replacement without any improvement in his symptoms.  He does have also a history of adrenal insufficiency, feels that he likely missed a dose of his steroids, does not recall this, however he did take his medication today.  He endorses feeling more short of breath specially with any type of activity.  He does not appear fluid overloaded despite his 1 L bolus.  UA without any nitrites, leukocytes or signs of infection.  Chest  x-ray is stable without any new onset pneumonia.  He denies any fever.  He was tachycardic at the cancer center with  a heart rate in the 130s, given a liter now heart rate has come down to 100.  Some suspicion for pulmonary embolism in the setting of cancer.  Vitals are otherwise within normal limits.  He does have a care plan of getting Solu-Cortef 60 mg IV, plan established by Dr. Elvera Lennox endocrinologist with [place].  I did attempt to reach the endocrinology team, however I have discussed this case with my attending Dr. Doran Durand who has also seen patient and agrees with plan and management. Spoke to Dr. Margo Aye, hospitalist service who will admit patient at this time for ongoing weakness.   Dispostion:  After consideration of the diagnostic results and the patients response to treatment, I feel that the patent would benefit from admission for weakness.    Portions of this note were generated with Scientist, clinical (histocompatibility and immunogenetics). Dictation errors may occur despite best attempts at proofreading.   Final Clinical Impression(s) / ED Diagnoses Final diagnoses:  Weakness    Rx / DC Orders ED Discharge Orders     None         Claude Manges, PA-C 10/02/22 1942    Glyn Ade, MD 10/02/22 2258

## 2022-10-03 DIAGNOSIS — I252 Old myocardial infarction: Secondary | ICD-10-CM | POA: Diagnosis not present

## 2022-10-03 DIAGNOSIS — Z8249 Family history of ischemic heart disease and other diseases of the circulatory system: Secondary | ICD-10-CM | POA: Diagnosis not present

## 2022-10-03 DIAGNOSIS — Z79899 Other long term (current) drug therapy: Secondary | ICD-10-CM | POA: Diagnosis not present

## 2022-10-03 DIAGNOSIS — Z7984 Long term (current) use of oral hypoglycemic drugs: Secondary | ICD-10-CM | POA: Diagnosis not present

## 2022-10-03 DIAGNOSIS — R7989 Other specified abnormal findings of blood chemistry: Secondary | ICD-10-CM | POA: Diagnosis not present

## 2022-10-03 DIAGNOSIS — R531 Weakness: Secondary | ICD-10-CM | POA: Diagnosis not present

## 2022-10-03 DIAGNOSIS — E2740 Unspecified adrenocortical insufficiency: Secondary | ICD-10-CM | POA: Diagnosis present

## 2022-10-03 DIAGNOSIS — E871 Hypo-osmolality and hyponatremia: Secondary | ICD-10-CM | POA: Diagnosis present

## 2022-10-03 DIAGNOSIS — R9431 Abnormal electrocardiogram [ECG] [EKG]: Secondary | ICD-10-CM | POA: Diagnosis not present

## 2022-10-03 DIAGNOSIS — E118 Type 2 diabetes mellitus with unspecified complications: Secondary | ICD-10-CM

## 2022-10-03 DIAGNOSIS — F17210 Nicotine dependence, cigarettes, uncomplicated: Secondary | ICD-10-CM | POA: Diagnosis present

## 2022-10-03 DIAGNOSIS — K219 Gastro-esophageal reflux disease without esophagitis: Secondary | ICD-10-CM | POA: Diagnosis present

## 2022-10-03 DIAGNOSIS — E1169 Type 2 diabetes mellitus with other specified complication: Secondary | ICD-10-CM | POA: Diagnosis present

## 2022-10-03 DIAGNOSIS — C3491 Malignant neoplasm of unspecified part of right bronchus or lung: Secondary | ICD-10-CM | POA: Diagnosis present

## 2022-10-03 DIAGNOSIS — E1165 Type 2 diabetes mellitus with hyperglycemia: Secondary | ICD-10-CM | POA: Diagnosis present

## 2022-10-03 DIAGNOSIS — Z86718 Personal history of other venous thrombosis and embolism: Secondary | ICD-10-CM | POA: Diagnosis not present

## 2022-10-03 DIAGNOSIS — E785 Hyperlipidemia, unspecified: Secondary | ICD-10-CM | POA: Diagnosis present

## 2022-10-03 DIAGNOSIS — R627 Adult failure to thrive: Secondary | ICD-10-CM | POA: Diagnosis present

## 2022-10-03 DIAGNOSIS — E861 Hypovolemia: Secondary | ICD-10-CM | POA: Diagnosis present

## 2022-10-03 DIAGNOSIS — E2749 Other adrenocortical insufficiency: Secondary | ICD-10-CM | POA: Diagnosis not present

## 2022-10-03 DIAGNOSIS — I2510 Atherosclerotic heart disease of native coronary artery without angina pectoris: Secondary | ICD-10-CM | POA: Diagnosis present

## 2022-10-03 DIAGNOSIS — E440 Moderate protein-calorie malnutrition: Secondary | ICD-10-CM | POA: Diagnosis present

## 2022-10-03 DIAGNOSIS — E1142 Type 2 diabetes mellitus with diabetic polyneuropathy: Secondary | ICD-10-CM | POA: Diagnosis present

## 2022-10-03 DIAGNOSIS — E872 Acidosis, unspecified: Secondary | ICD-10-CM | POA: Diagnosis present

## 2022-10-03 DIAGNOSIS — I471 Supraventricular tachycardia, unspecified: Secondary | ICD-10-CM | POA: Diagnosis present

## 2022-10-03 DIAGNOSIS — Z880 Allergy status to penicillin: Secondary | ICD-10-CM | POA: Diagnosis not present

## 2022-10-03 DIAGNOSIS — E876 Hypokalemia: Secondary | ICD-10-CM | POA: Diagnosis present

## 2022-10-03 DIAGNOSIS — I10 Essential (primary) hypertension: Secondary | ICD-10-CM | POA: Diagnosis present

## 2022-10-03 LAB — GLUCOSE, CAPILLARY
Glucose-Capillary: 96 mg/dL (ref 70–99)
Glucose-Capillary: 138 mg/dL — ABNORMAL HIGH (ref 70–99)
Glucose-Capillary: 178 mg/dL — ABNORMAL HIGH (ref 70–99)
Glucose-Capillary: 156 mg/dL — ABNORMAL HIGH (ref 70–99)

## 2022-10-03 LAB — CREATININE, URINE, RANDOM: Creatinine, Urine: 153 mg/dL

## 2022-10-03 LAB — SODIUM, URINE, RANDOM: Sodium, Ur: 19 mmol/L

## 2022-10-03 MED ORDER — PREDNISONE 5 MG PO TABS
5.0000 mg | ORAL_TABLET | Freq: Every evening | ORAL | Status: DC
  Administered 2022-10-03: 5 mg via ORAL
  Filled 2022-10-03: qty 1

## 2022-10-03 MED ORDER — SODIUM CHLORIDE 0.9 % IV SOLN: INTRAVENOUS | Status: AC

## 2022-10-03 NOTE — Progress Notes (Signed)
[facility] PROGRESS NOTE    Progress Note  Brandon Rubio.  VQQ:[id number] DOB: [birth year] DOA: 10/02/2022 PCP: [name], MD     Brief Narrative:   Brandon Rubio. is an 72 y.o. 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  Except for his white blood cell count which was 2.9 and a hemoglobin of 12    Assessment/Plan:   Failure to thrive in adult/with a history of noncompliance with the steroids in the past: Of unclear etiology question with adrenal insufficiency chemotherapy. He did receive a bolus normal saline at the cancer center before being sent to the ED, in the ED he was hyponatremic and his chloride of 99 creatinine of 1.1. Was started on gentle IV fluids creatinine has decreased continues to be hyponatremic. Will go ahead and continue higher dose of steroids. Out of bed to chair, consult physical therapy.  Hyponatremia: Specific gravity in the UA was 1020, will go ahead and start him on IV fluids recheck basic metabolic panel in the morning. BNP was 37 troponins were flat, unfortunately urine lites were not obtained on admission. Check urinary sodium urinary creatinine, try to add them to previous lab.  Hypomagnesemia: 1.3 on admission was given IV mag, this morning is 2.1.  High anion gap metabolic acidosis: Setting of malignancy, on admission anion gap was 18 after IV fluids and now is 12.   Diabetes mellitus type 2 with complications (HCC) Last A1c of 6.8 with hyperglycemia. Continue sliding scale  insulin.  GERD: Continue PPI.  Hyperlipidemia: continue statins.  Recurrent metastatic non-small cell adenocarcinoma: On palliative chemotherapy last treatment on 09/24/2022.  Relative adrenal insufficiency: Resume home dose of steroids she was given a stress dose in the ED.  Tobacco disorder: Continues to smoke he has been counseled declined Chantix and nicotine patch.    DVT prophylaxis: lovenox Family Communication:none Status is: Observation The patient remains OBS appropriate and will d/c before 2 midnights.    Code Status:     Code Status Orders  (From admission, onward)           Start     Ordered   10/02/22 1939  Full code  Continuous       Question:  By:  Answer:  Consent: discussion documented in EHR   10/02/22 1938           Code Status History     Date Active Date Inactive Code Status Order ID Comments User Context   05/23/2021 1642 05/27/2021 1959 Full Code [id number]  Drema Dallas, MD ED   05/10/2021 1919 05/16/2021 1706 Full Code [id number]  Albertine Grates, MD Inpatient   03/07/2021 2043 03/09/2021 1831 Full Code [id number]  Howerter, Chaney Born, DO Inpatient         IV Access:   Peripheral IV   Procedures and diagnostic studies:   DG Chest 2 View  Result Date: 10/02/2022 CLINICAL DATA:  Weakness EXAM: CHEST - 2 VIEW COMPARISON:  CT chest 08/29/2022 and radiograph 05/24/2021 FINDINGS: Stable cardiomediastinal silhouette given patient rotation. Aortic atherosclerotic calcification. Right midlung scarring/atelectasis. Nodular opacities in the left mid and lower  lung better assessed on recent CT chest 08/29/2022. No pleural effusion or pneumothorax. No displaced rib fractures. IMPRESSION: Nodular opacities in the left mid and lower lung are grossly similar to CT 08/29/2022. Electronically Signed   By: Minerva Fester M.D.   On: 10/02/2022 16:48     Medical Consultants:   None.   Subjective:    Brandon Rubio. manage feels  better.  Objective:    Vitals:   10/02/22 2009 10/02/22 2034 10/03/22 0041 10/03/22 0439  BP:  127/67 99/63 102/64  Pulse:  (!) 101 93 93  Resp:  16 16 16   Temp: 97.8 F (36.6 C) 98 F (36.7 C) (!) 97.5 F (36.4 C) 98 F (36.7 C)  TempSrc: Oral     SpO2:  98% 98% 98%  Weight:      Height:       SpO2: 98 %   Intake/Output Summary (Last 24 hours) at 10/03/2022 0735 Last data filed at 10/03/2022 0500 Gross per 24 hour  Intake --  Output 300 ml  Net -300 ml   Filed Weights   10/02/22 1543  Weight: 80.5 kg    Exam: General exam: In no acute distress. Respiratory system: Good air movement and clear to auscultation. Cardiovascular system: S1 & S2 heard, RRR. No JVD. Gastrointestinal system: Abdomen is nondistended, soft and nontender.  Extremities: No pedal edema. Skin: No rashes, lesions or ulcers Psychiatry: Judgement and insight appear normal. Mood & affect appropriate.    Data Reviewed:    Labs: Basic Metabolic Panel: Recent Labs  Lab 10/02/22 1255 10/03/22 0452  NA 134* 132*  K 3.7 3.7  CL 99 99  CO2 17* 21*  GLUCOSE 144* 122*  BUN 24* 20  CREATININE 1.19 0.90  CALCIUM 9.8 8.9  MG 1.3* 2.1  PHOS  --  3.1   GFR Estimated Creatinine Clearance: 79 mL/min (by C-G formula based on SCr of 0.9 mg/dL). Liver Function Tests: Recent Labs  Lab 10/02/22 1255  AST 29  ALT 22  ALKPHOS 62  BILITOT 0.5  PROT 7.7  ALBUMIN 4.1   No results for input(s): "LIPASE", "AMYLASE" in the last 168 hours. No results for input(s): "AMMONIA" in the last 168 hours. Coagulation profile No results for input(s): "INR", "PROTIME" in the last 168 hours. COVID-19 Labs  No results for input(s): "DDIMER", "FERRITIN", "LDH", "CRP" in the last 72 hours.  Lab Results  Component Value Date   SARSCOV2NAA NEGATIVE 05/23/2021   SARSCOV2NAA NEGATIVE 05/10/2021   SARSCOV2NAA NEGATIVE 05/06/2021   SARSCOV2NAA NEGATIVE 03/07/2021    CBC: Recent Labs  Lab 10/02/22 1255  10/03/22 0452  WBC 4.4 2.9*  NEUTROABS 2.3  --   HGB 12.0* 10.1*  HCT 33.8* 29.7*  MCV 93.6 95.5  PLT 150 150   Cardiac Enzymes: No results for input(s): "CKTOTAL", "CKMB", "CKMBINDEX", "TROPONINI" in the last 168 hours. BNP (last 3 results) No results for input(s): "PROBNP" in the last 8760 hours. CBG: Recent Labs  Lab 10/02/22 2043  GLUCAP 212*   D-Dimer: No results for input(s): "DDIMER" in the last 72 hours. Hgb A1c: No results for input(s): "HGBA1C" in the last 72 hours. Lipid Profile: No results for input(s): "CHOL", "HDL", "LDLCALC", "TRIG", "CHOLHDL", "LDLDIRECT" in the last 72 hours. Thyroid function studies: No results for input(s): "TSH", "T4TOTAL", "T3FREE", "THYROIDAB" in the last 72 hours.  Invalid input(s): "FREET3" Anemia work up: No results for input(s): "VITAMINB12", "FOLATE", "FERRITIN", "TIBC", "IRON", "RETICCTPCT" in the last 72 hours. Sepsis Labs: Recent  Labs  Lab 10/02/22 1255 10/03/22 0452  WBC 4.4 2.9*   Microbiology No results found for this or any previous visit (from the past 240 hour(s)).   Medications:    aspirin EC  81 mg Oral q AM   enoxaparin (LOVENOX) injection  40 mg Subcutaneous Q24H   feeding supplement  237 mL Oral BID BM   folic acid  1 mg Oral Daily   insulin aspart  0-5 Units Subcutaneous QHS   insulin aspart  0-9 Units Subcutaneous TID WC   multivitamin with minerals  1 tablet Oral Q breakfast   pantoprazole  40 mg Oral Daily   predniSONE  7.5 mg Oral Q breakfast   simvastatin  40 mg Oral q1800   Continuous Infusions:  sodium chloride        LOS: 0 days   Marinda Elk  [facility]  10/03/2022, 7:35 AM

## 2022-10-03 NOTE — Progress Notes (Signed)
Initial Nutrition Assessment  DOCUMENTATION CODES:   Non-severe (moderate) malnutrition in context of chronic illness  INTERVENTION:   -Ensure Plus High Protein po BID, each supplement provides 350 kcal and 20 grams of protein. -likely to refuse  -Magic cup TID with meals, each supplement provides 290 kcal and 9 grams of protein   -Encouraged PO intakes -improving  NUTRITION DIAGNOSIS:   Moderate Malnutrition related to chronic illness, cancer and cancer related treatments as evidenced by moderate fat depletion, severe muscle depletion.  GOAL:   Patient will meet greater than or equal to 90% of their needs  MONITOR:   Supplement acceptance, PO intake, Weight trends, Labs, I & O's  REASON FOR ASSESSMENT:   Consult Assessment of nutrition requirement/status  ASSESSMENT:   72 y.o. 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  Patient in room, looking at menu about to order lunch. Pt states he is feeling better today. Ate an omelet with a piece of a bagel this morning for breakfast.  Pt underwent chemotherapy 7/17 and since that time pt was feeling bad and did not eat for 5-6 days following. Pt states today is the best he has eaten in a week. Pt does not like or want Ensure supplements. Feels that he will eat better now and plans to walk more today. Denies issues with swallowing, chewing or taste. Will order Magic cup for patient to try.  Last weight recorded PTA, 181 lbs on 10/29/21.  Current weight: 177 lbs.  Medications: Folic acid, Multivitamin with  minerals daily  Labs reviewed: CBGs: 96-212 Low Na   NUTRITION - FOCUSED PHYSICAL EXAM:  Flowsheet Row Most Recent Value  Orbital Region Mild depletion  Upper Arm Region Moderate depletion  Thoracic and Lumbar Region Unable to assess  Buccal Region Moderate depletion  Temple Region Moderate depletion  Clavicle Bone Region Severe depletion  Clavicle and Acromion Bone Region Severe depletion  Scapular Bone Region Severe depletion  Dorsal Hand Mild depletion  Patellar Region Severe depletion  Anterior Thigh Region Severe depletion  Posterior Calf Region Severe depletion  Edema (RD Assessment) None  Hair Reviewed  Eyes Reviewed  Mouth Reviewed  Skin Reviewed       Diet Order:   Diet Order             Diet regular Fluid consistency: Thin  Diet effective now                   EDUCATION NEEDS:   No education needs have been identified at this time  Skin:  Skin Assessment: Reviewed RN Assessment  Last BM:  7/24  Height:   Ht Readings from Last 1 Encounters:  10/02/22 6' (1.829 m)    Weight:   Wt Readings from Last 1 Encounters:  10/02/22 80.5 kg    BMI:  Body mass index is 24.07 kg/m.  Estimated Nutritional Needs:   Kcal:  2300-2500  Protein:  115-125g  Fluid:  2.3L/day  Tilda Franco, MS, RD, LDN Inpatient Clinical Dietitian Contact information available via Amion

## 2022-10-03 NOTE — Evaluation (Signed)
Physical Therapy Evaluation Patient Details Name: Brandon Rubio. MRN: [id number] DOB: 06/26/1950 Today's Date: 10/03/2022  History of Present Illness  72 y.o. 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 Dx of hyponatremia, metabolic acidosis.  Clinical Impression  Pt ambulated 180' without an assistive device, no loss of balance, SpO2 97% on room air walking, HR 127 walking. He is mobilizing well at an independent level. PT signing off, will have mobility team follow.         Assistance Recommended at Discharge None  If plan is discharge home, recommend the following:  Can travel by private vehicle           Equipment Recommendations None recommended by PT  Recommendations for Other Services       Functional Status Assessment Patient has not had a recent decline in their functional status     Precautions / Restrictions Precautions Precautions: None Restrictions Weight Bearing Restrictions: No      Mobility  Bed Mobility               General bed mobility comments: up in recliner    Transfers Overall transfer level: Modified independent   Transfers: Sit to/from Stand Sit to Stand: Modified independent (Device/Increase time)           General transfer comment: used armrests to push up    Ambulation/Gait Ambulation/Gait assistance: Independent Gait Distance (Feet): 180 Feet Assistive device: None Gait Pattern/deviations: WFL(Within Functional Limits) Gait velocity: WFL     General Gait Details: steady, no loss of balance, HR 127 with walking, SpO2 97% on room air walking  Stairs            Wheelchair Mobility     Tilt Bed    Modified Rankin (Stroke  Patients Only)       Balance Overall balance assessment: No apparent balance deficits (not formally assessed)                                           Pertinent Vitals/Pain Pain Assessment Pain Assessment: No/denies pain    Home Living Family/patient expects to be discharged to:: Private residence Living Arrangements: Alone Available Help at Discharge: Available 24 hours/day Type of Home: House Home Access: Stairs to enter Entrance Stairs-Rails: None Entrance Stairs-Number of Steps: 1   Home Layout: One level Home Equipment: Agricultural consultant (2 wheels)      Prior Function Prior Level of Function : Independent/Modified Independent;Driving                     Hand Dominance   Dominant Hand: Right    Extremity/Trunk Assessment   Upper Extremity Assessment Upper Extremity Assessment: Defer to OT evaluation    Lower Extremity Assessment Lower Extremity Assessment: Overall WFL for tasks assessed    Cervical / Trunk Assessment Cervical / Trunk Assessment: Normal  Communication   Communication: No difficulties  Cognition Arousal/Alertness: Awake/alert Behavior During Therapy: WFL for tasks assessed/performed Overall Cognitive Status: Within Functional Limits for tasks assessed  General Comments      Exercises     Assessment/Plan    PT Assessment Patient does not need any further PT services  PT Problem List         PT Treatment Interventions      PT Goals (Current goals can be found in the Care Plan section)  Acute Rehab PT Goals Patient Stated Goal: likes to fish PT Goal Formulation: All assessment and education complete, DC therapy    Frequency       Co-evaluation               AM-PAC PT "6 Clicks" Mobility  Outcome Measure Help needed turning from your back to your side while in a flat bed without using bedrails?: None Help needed moving from lying on  your back to sitting on the side of a flat bed without using bedrails?: None Help needed moving to and from a bed to a chair (including a wheelchair)?: None Help needed standing up from a chair using your arms (e.g., wheelchair or bedside chair)?: None Help needed to walk in hospital room?: None Help needed climbing 3-5 steps with a railing? : None 6 Click Score: 24    End of Session Equipment Utilized During Treatment: Gait belt Activity Tolerance: Patient tolerated treatment well Patient left: in chair;with call bell/phone within reach Nurse Communication: Mobility status      Time: 1026-1040 PT Time Calculation (min) (ACUTE ONLY): 14 min   Charges:   PT Evaluation $PT Eval Low Complexity: 1 Low   PT General Charges $$ ACUTE PT VISIT: 1 Visit         Tamala Ser PT 10/03/2022  Acute Rehabilitation Services  Office [phone]

## 2022-10-03 NOTE — Progress Notes (Signed)
   10/03/22 1303  TOC Brief Assessment  Insurance and Status Reviewed  Patient has primary care physician Yes  Home environment has been reviewed Home alone  Prior level of function: independent  Prior/Current Home Services No current home services  Social Determinants of Health Reivew SDOH reviewed no interventions necessary  Readmission risk has been reviewed Yes  Transition of care needs no transition of care needs at this time

## 2022-10-03 NOTE — Evaluation (Signed)
Occupational Therapy Evaluation Patient Details Name: Brandon Rubio. MRN: [id number] DOB: [birth year] Today's Date: 10/03/2022   History of Present Illness 72 y.o. 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 Dx of hyponatremia, metabolic acidosis.   Clinical Impression   PTA pt lives alone independently and has assistance from his family as needed. Art states managing his self care was very difficult PTA due to fatigue. Art feels "much better" today and abe to ambulate and complete ADL tasks with S with VSS on RA; Max HR 128 during activity. Acute OT will follow to educate on energy conservation and fall prevention strategies. No OT follow up needed after DC. Pt agreeable with POC.      Recommendations for follow up therapy are one component of a multi-disciplinary discharge planning process, led by the attending physician.  Recommendations may be updated based on patient status, additional functional criteria and insurance authorization.   Assistance Recommended at Discharge Intermittent Supervision/Assistance  Patient can return home with the following Assistance with cooking/housework    Functional Status Assessment  Patient has had a recent decline in their functional status and demonstrates the ability to make significant improvements in function in a reasonable and predictable amount of time.  Equipment Recommendations  Tub/shower seat    Recommendations for Other Services       Precautions / Restrictions        Mobility Bed Mobility Overal bed mobility: Independent                  Transfers Overall transfer level: Needs assistance   Transfers: Sit to/from Stand Sit to Stand:  Supervision                  Balance Overall balance assessment: No apparent balance deficits (not formally assessed)                                         ADL either performed or assessed with clinical judgement   ADL Overall ADL's : Needs assistance/impaired                                     Functional mobility during ADLs: Supervision/safety General ADL Comments: S with ADL tasks without AD; states he feels "much better"; no SOB; would benefit from use of shower chair and ed on E cons     Vision Baseline Vision/History: 1 Wears glasses Ability to See in Adequate Light: 0 Adequate       Perception     Praxis      Pertinent Vitals/Pain Pain Assessment Pain Assessment: No/denies pain     Hand Dominance Right   Extremity/Trunk Assessment Upper Extremity Assessment Upper Extremity Assessment: Generalized weakness   Lower Extremity Assessment Lower Extremity Assessment: Defer to PT evaluation   Cervical / Trunk Assessment Cervical / Trunk Assessment: Normal   Communication Communication Communication: No difficulties   Cognition Arousal/Alertness: Awake/alert Behavior During Therapy: WFL for tasks assessed/performed Overall Cognitive Status: Within Functional Limits for tasks assessed  General Comments       Exercises     Shoulder Instructions      Home Living Family/patient expects to be discharged to:: Private residence Living Arrangements: Alone Available Help at Discharge: Available 24 hours/day Type of Home: House Home Access: Stairs to enter [employer] of Steps: 1 Entrance Stairs-Rails: None Home Layout: One level     Bathroom Shower/Tub: Producer, television/film/video: Standard Bathroom Accessibility: Yes How Accessible: Accessible via walker Home Equipment: Rolling Walker (2 wheels)          Prior Functioning/Environment  Prior Level of Function : Independent/Modified Independent;Driving                        OT Problem List: Decreased activity tolerance;Cardiopulmonary status limiting activity      OT Treatment/Interventions: Self-care/ADL training;Energy conservation;Therapeutic exercise;Therapeutic activities;Patient/family education    OT Goals(Current goals can be found in the care plan section) Acute Rehab OT Goals Patient Stated Goal: to feel better OT Goal Formulation: With patient Time For Goal Achievement: 10/17/22 Potential to Achieve Goals: Good  OT Frequency: Min 1X/week    Co-evaluation              AM-PAC OT "6 Clicks" Daily Activity     Outcome Measure Help from another person eating meals?: None Help from another person taking care of personal grooming?: A Little Help from another person toileting, which includes using toliet, bedpan, or urinal?: A Little Help from another person bathing (including washing, rinsing, drying)?: A Little Help from another person to put on and taking off regular upper body clothing?: A Little Help from another person to put on and taking off regular lower body clothing?: A Little 6 Click Score: 19   End of Session Equipment Utilized During Treatment: Gait belt Nurse Communication: Mobility status  Activity Tolerance: Patient tolerated treatment well Patient left: in chair;with call bell/phone within reach;Other (comment) (working with PT)  OT Visit Diagnosis: Muscle weakness (generalized) (M62.81)                Time: 1010-1030 OT Time Calculation (min): 20 min Charges:  OT General Charges $OT Visit: 1 Visit OT Evaluation $OT Eval Low Complexity: 1 Low  Keivon Garden, OT/L   Acute OT Clinical Specialist Acute Rehabilitation Services Pager [phone] Office [phone]   [facility] 10/03/2022, 10:37 AM

## 2022-10-04 DIAGNOSIS — E440 Moderate protein-calorie malnutrition: Secondary | ICD-10-CM | POA: Insufficient documentation

## 2022-10-04 DIAGNOSIS — R531 Weakness: Secondary | ICD-10-CM | POA: Diagnosis not present

## 2022-10-04 DIAGNOSIS — R627 Adult failure to thrive: Secondary | ICD-10-CM | POA: Diagnosis not present

## 2022-10-04 LAB — GLUCOSE, CAPILLARY
Glucose-Capillary: 116 mg/dL — ABNORMAL HIGH (ref 70–99)
Glucose-Capillary: 212 mg/dL — ABNORMAL HIGH (ref 70–99)
Glucose-Capillary: 185 mg/dL — ABNORMAL HIGH (ref 70–99)
Glucose-Capillary: 169 mg/dL — ABNORMAL HIGH (ref 70–99)

## 2022-10-04 LAB — BASIC METABOLIC PANEL
Anion gap: 8 (ref 5–15)
BUN: 20 mg/dL (ref 8–23)
CO2: 24 mmol/L (ref 22–32)
Calcium: 8.7 mg/dL — ABNORMAL LOW (ref 8.9–10.3)
Chloride: 106 mmol/L (ref 98–111)
Creatinine, Ser: 0.78 mg/dL (ref 0.61–1.24)
GFR, Estimated: >60 mL/min (ref >60)
Glucose, Bld: 159 mg/dL — ABNORMAL HIGH (ref 70–99)
Potassium: 3.5 mmol/L (ref 3.5–5.1)
Sodium: 138 mmol/L (ref 135–145)

## 2022-10-04 LAB — MAGNESIUM: Magnesium: 1.7 mg/dL (ref 1.7–2.4)

## 2022-10-04 LAB — PHOSPHORUS: Phosphorus: 1.7 mg/dL — ABNORMAL LOW (ref 2.5–4.6)

## 2022-10-04 MED ORDER — K PHOS MONO-SOD PHOS DI & MONO 155-852-130 MG PO TABS
500.0000 mg | ORAL_TABLET | Freq: Two times a day (BID) | ORAL | Status: AC
  Administered 2022-10-04 (×2): 500 mg via ORAL
  Filled 2022-10-04 (×2): qty 2

## 2022-10-04 MED ORDER — POTASSIUM PHOSPHATES 15 MMOLE/5ML IV SOLN
30.0000 mmol | Freq: Once | INTRAVENOUS | Status: AC
  Administered 2022-10-04: 30 mmol via INTRAVENOUS
  Filled 2022-10-04: qty 10

## 2022-10-04 MED ORDER — PREDNISONE 5 MG PO TABS
2.5000 mg | ORAL_TABLET | Freq: Every evening | ORAL | Status: DC
  Administered 2022-10-04: 2.5 mg via ORAL
  Filled 2022-10-04: qty 1

## 2022-10-04 MED ORDER — MAGNESIUM SULFATE 2 GM/50ML IV SOLN
2.0000 g | Freq: Once | INTRAVENOUS | Status: AC
  Administered 2022-10-04: 2 g via INTRAVENOUS
  Filled 2022-10-04: qty 50

## 2022-10-04 NOTE — Progress Notes (Signed)
Sustained SVT 170's lasting approx 3 minutes pt sleeping during this time Asymptomatic converted to Ns at 0323  Seen by RR/On call. Mg\Phos\Bmp EKG completed vvs resting quietly

## 2022-10-04 NOTE — Plan of Care (Signed)

## 2022-10-04 NOTE — Progress Notes (Signed)
    Patient Name: Brandon Rubio.           DOB: 1950/10/08  MRN: [id number]      Admission Date: 10/02/2022  Attending Provider: Marinda Elk, MD  Primary Diagnosis: Failure to thrive in adult   Level of care: Med-Surg    CROSS COVER NOTE   Date of Service   10/04/2022   Brandon Muscat., 72 y.o. male, was admitted on 10/02/2022 for Failure to thrive in adult.    HPI/Events of Note   Supraventricular tachycardia, HR 170s for appx 3 minutes.  Self converted back to NSR.  SVT only captured by telemetry, occurred while pt was asleep.  Asymptomatic, hemodynamically stable.  No acute changes. Mentation at baseline.  Denies lightheadedness, CP, palpitations, SOB, abdominal discomfort, N/ V   Interventions/ Plan   EKG --> NSR (HR 80-90) without ST seg changes. Labs- bmp, mag, phosp        Anthoney Harada, DNP, [employer] [facility] [employer]

## 2022-10-04 NOTE — Progress Notes (Signed)
[facility] PROGRESS NOTE    Progress Note  Brandon Rubio.  ZOX:[id number] DOB: 06/19/1950 DOA: 10/02/2022 PCP: [name], MD     Brief Narrative:   Chrisotpher Rubio. is an 72 y.o. 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  Except for his white blood cell count which was 2.9 and a hemoglobin of 12  Assessment/Plan:   Failure to thrive in adult/with a history of noncompliance with the steroids in the past: Of unclear etiology question with adrenal insufficiency in the setting of chemotherapy. Was started on gentle IV fluids creatinine has decreased. Titrate down steroids. Out of bed to chair, consult physical therapy.  Hyponatremia: Specific gravity in the UA was 1020. Started on IV fluids sodium has corrected. Question of due to noncompliance with the steroids.  Hypomagnesemia: 1.3 on admission was given IV mag, this morning is 2.1.  High anion gap metabolic acidosis: Setting of malignancy, on admission anion gap was 18 after IV fluids and now is 12.   Diabetes mellitus type 2 with complications (HCC) Last A1c of 6.8 with hyperglycemia. Continue sliding scale insulin.  GERD: Continue PPI.  Hyperlipidemia: continue statins.  Recurrent metastatic non-small cell adenocarcinoma: On palliative chemotherapy last treatment on 09/24/2022.  Relative adrenal insufficiency: Resume home dose of steroids she was given a stress dose in the ED.  Tobacco disorder: Continues to smoke he has been counseled declined Chantix and  nicotine patch.    DVT prophylaxis: lovenox Family Communication:none Status is: Observation The patient remains OBS appropriate and will d/c before 2 midnights.    Code Status:     Code Status Orders  (From admission, onward)           Start     Ordered   10/02/22 1939  Full code  Continuous       Question:  By:  Answer:  Consent: discussion documented in EHR   10/02/22 1938           Code Status History     Date Active Date Inactive Code Status Order ID Comments User Context   05/23/2021 1642 05/27/2021 1959 Full Code [id number]  Drema Dallas, MD ED   05/10/2021 1919 05/16/2021 1706 Full Code [id number]  Albertine Grates, MD Inpatient   03/07/2021 2043 03/09/2021 1831 Full Code [id number]  Howerter, Chaney Born, DO Inpatient         IV Access:   Peripheral IV   Procedures and diagnostic studies:   DG Chest 2 View  Result Date: 10/02/2022 CLINICAL DATA:  Weakness EXAM: CHEST - 2 VIEW COMPARISON:  CT chest 08/29/2022 and radiograph 05/24/2021 FINDINGS: Stable cardiomediastinal silhouette given patient rotation. Aortic atherosclerotic calcification. Right midlung scarring/atelectasis. Nodular opacities in the left mid and lower lung better assessed on recent CT chest 08/29/2022. No pleural effusion or pneumothorax. No displaced rib fractures. IMPRESSION: Nodular opacities in the left mid and lower lung are grossly similar to CT 08/29/2022. Electronically Signed   By: Minerva Fester M.D.   On: 10/02/2022 16:48     Medical Consultants:   None.   Subjective:    Brandon Rubio. relates  he feels better.  Objective:    Vitals:   10/03/22 0810 10/03/22 1344 10/03/22 1948 10/04/22 0406  BP: 126/83 103/64 (!) 94/58 123/82  Pulse: (!) 103 98 92 82  Resp: 17 18 18 18   Temp: (!) 97.5 F (36.4 C) 97.6 F (36.4 C) 97.6 F (36.4 C) (!) 97.4 F (36.3 C)  TempSrc: Oral  Oral Oral  SpO2: 100% 98% 99% 99%  Weight:      Height:       SpO2: 99 %   Intake/Output  Summary (Last 24 hours) at 10/04/2022 0915 Last data filed at 10/04/2022 0815 Gross per 24 hour  Intake 720.1 ml  Output 750 ml  Net -29.9 ml   Filed Weights   10/02/22 1543  Weight: 80.5 kg    Exam: General exam: In no acute distress. Respiratory system: Good air movement and clear to auscultation. Cardiovascular system: S1 & S2 heard, RRR. No JVD. Gastrointestinal system: Abdomen is nondistended, soft and nontender.  Extremities: No pedal edema. Skin: No rashes, lesions or ulcers Psychiatry: Judgement and insight appear normal. Mood & affect appropriate.    Data Reviewed:    Labs: Basic Metabolic Panel: Recent Labs  Lab 10/02/22 1255 10/03/22 0452 10/04/22 0350  NA 134* 132* 138  K 3.7 3.7 3.5  CL 99 99 106  CO2 17* 21* 24  GLUCOSE 144* 122* 159*  BUN 24* 20 20  CREATININE 1.19 0.90 0.78  CALCIUM 9.8 8.9 8.7*  MG 1.3* 2.1 1.7  PHOS  --  3.1 1.7*   GFR Estimated Creatinine Clearance: 88.9 mL/min (by C-G formula based on SCr of 0.78 mg/dL). Liver Function Tests: Recent Labs  Lab 10/02/22 1255  AST 29  ALT 22  ALKPHOS 62  BILITOT 0.5  PROT 7.7  ALBUMIN 4.1   No results for input(s): "LIPASE", "AMYLASE" in the last 168 hours. No results for input(s): "AMMONIA" in the last 168 hours. Coagulation profile No results for input(s): "INR", "PROTIME" in the last 168 hours. COVID-19 Labs  No results for input(s): "DDIMER", "FERRITIN", "LDH", "CRP" in the last 72 hours.  Lab Results  Component Value Date   SARSCOV2NAA NEGATIVE 05/23/2021   SARSCOV2NAA NEGATIVE 05/10/2021   SARSCOV2NAA NEGATIVE 05/06/2021   SARSCOV2NAA NEGATIVE 03/07/2021    CBC: Recent Labs  Lab 10/02/22 1255 10/03/22 0452  WBC 4.4 2.9*  NEUTROABS 2.3  --   HGB 12.0* 10.1*  HCT 33.8* 29.7*  MCV 93.6 95.5  PLT 150 150   Cardiac Enzymes: No results for input(s): "CKTOTAL", "CKMB", "CKMBINDEX", "TROPONINI" in the last 168 hours. BNP (last 3 results) No results for input(s):  "PROBNP" in the last 8760 hours. CBG: Recent Labs  Lab 10/03/22 0752 10/03/22 1150 10/03/22 1733 10/03/22 2057 10/04/22 0726  GLUCAP 96 138* 178* 156* 116*   D-Dimer: No results for input(s): "DDIMER" in the last 72 hours. Hgb A1c: No results for input(s): "HGBA1C" in the last 72 hours. Lipid Profile: No results for input(s): "CHOL", "HDL", "LDLCALC", "TRIG", "CHOLHDL", "LDLDIRECT" in the last 72 hours. Thyroid function studies: No results for input(s): "TSH", "T4TOTAL", "T3FREE", "THYROIDAB" in the last 72 hours.  Invalid input(s): "FREET3" Anemia work up: No results for input(s): "VITAMINB12", "FOLATE", "FERRITIN", "TIBC", "IRON", "RETICCTPCT" in the last 72 hours. Sepsis Labs: Recent Labs  Lab 10/02/22 1255 10/03/22 0452  WBC 4.4 2.9*   Microbiology No results found for this or any previous visit (from the past 240 hour(s)).   Medications:    aspirin EC  81  mg Oral q AM   enoxaparin (LOVENOX) injection  40 mg Subcutaneous Q24H   feeding supplement  237 mL Oral BID BM   folic acid  1 mg Oral Daily   insulin aspart  0-5 Units Subcutaneous QHS   insulin aspart  0-9 Units Subcutaneous TID WC   multivitamin with minerals  1 tablet Oral Q breakfast   pantoprazole  40 mg Oral Daily   predniSONE  5 mg Oral QPM   predniSONE  7.5 mg Oral Q breakfast   simvastatin  40 mg Oral q1800   Continuous Infusions:  potassium PHOSPHATE IVPB (in mmol) 30 mmol (10/04/22 0750)      LOS: 1 day   Marinda Elk  [facility]  10/04/2022, 9:15 AM

## 2022-10-05 ENCOUNTER — Encounter (HOSPITAL_COMMUNITY): Payer: Self-pay | Admitting: Internal Medicine

## 2022-10-05 DIAGNOSIS — R531 Weakness: Secondary | ICD-10-CM | POA: Diagnosis not present

## 2022-10-05 DIAGNOSIS — I471 Supraventricular tachycardia, unspecified: Secondary | ICD-10-CM

## 2022-10-05 DIAGNOSIS — R7989 Other specified abnormal findings of blood chemistry: Secondary | ICD-10-CM | POA: Diagnosis not present

## 2022-10-05 DIAGNOSIS — E118 Type 2 diabetes mellitus with unspecified complications: Secondary | ICD-10-CM | POA: Diagnosis not present

## 2022-10-05 DIAGNOSIS — R627 Adult failure to thrive: Secondary | ICD-10-CM | POA: Diagnosis not present

## 2022-10-05 LAB — GLUCOSE, CAPILLARY
Glucose-Capillary: 176 mg/dL — ABNORMAL HIGH (ref 70–99)
Glucose-Capillary: 125 mg/dL — ABNORMAL HIGH (ref 70–99)
Glucose-Capillary: 169 mg/dL — ABNORMAL HIGH (ref 70–99)
Glucose-Capillary: 139 mg/dL — ABNORMAL HIGH (ref 70–99)
Glucose-Capillary: 191 mg/dL — ABNORMAL HIGH (ref 70–99)
Glucose-Capillary: 185 mg/dL — ABNORMAL HIGH (ref 70–99)

## 2022-10-05 LAB — BASIC METABOLIC PANEL
Anion gap: 11 (ref 5–15)
Anion gap: 7 (ref 5–15)
BUN: 24 mg/dL — ABNORMAL HIGH (ref 8–23)
BUN: 21 mg/dL (ref 8–23)
CO2: 21 mmol/L — ABNORMAL LOW (ref 22–32)
CO2: 26 mmol/L (ref 22–32)
Calcium: 8 mg/dL — ABNORMAL LOW (ref 8.9–10.3)
Calcium: 8.4 mg/dL — ABNORMAL LOW (ref 8.9–10.3)
Chloride: 105 mmol/L (ref 98–111)
Chloride: 106 mmol/L (ref 98–111)
Creatinine, Ser: 1.06 mg/dL (ref 0.61–1.24)
Creatinine, Ser: 0.78 mg/dL (ref 0.61–1.24)
GFR, Estimated: >60 mL/min (ref >60)
GFR, Estimated: >60 mL/min (ref >60)
Glucose, Bld: 215 mg/dL — ABNORMAL HIGH (ref 70–99)
Glucose, Bld: 148 mg/dL — ABNORMAL HIGH (ref 70–99)
Potassium: 3.2 mmol/L — ABNORMAL LOW (ref 3.5–5.1)
Potassium: 4.1 mmol/L (ref 3.5–5.1)
Sodium: 137 mmol/L (ref 135–145)
Sodium: 139 mmol/L (ref 135–145)

## 2022-10-05 LAB — MAGNESIUM
Magnesium: 1.4 mg/dL — ABNORMAL LOW (ref 1.7–2.4)
Magnesium: 2 mg/dL (ref 1.7–2.4)

## 2022-10-05 LAB — TROPONIN I (HIGH SENSITIVITY): Troponin I (High Sensitivity): 20 ng/L — ABNORMAL HIGH (ref <18)

## 2022-10-05 LAB — MRSA NEXT GEN BY PCR, NASAL: MRSA by PCR Next Gen: NOT DETECTED

## 2022-10-05 LAB — PHOSPHORUS: Phosphorus: 3 mg/dL (ref 2.5–4.6)

## 2022-10-05 MED ORDER — AMIODARONE LOAD VIA INFUSION
150.0000 mg | INTRAVENOUS | Status: DC
Start: 2022-10-05 — End: 2022-10-05

## 2022-10-05 MED ORDER — PREDNISONE 5 MG PO TABS
5.0000 mg | ORAL_TABLET | Freq: Every evening | ORAL | Status: DC
  Administered 2022-10-05 – 2022-10-06 (×2): 5 mg via ORAL
  Filled 2022-10-05 (×2): qty 1

## 2022-10-05 MED ORDER — POTASSIUM CHLORIDE CRYS ER 20 MEQ PO TBCR
40.0000 meq | EXTENDED_RELEASE_TABLET | Freq: Two times a day (BID) | ORAL | Status: AC
  Administered 2022-10-05 (×2): 40 meq via ORAL
  Filled 2022-10-05 (×2): qty 2

## 2022-10-05 MED ORDER — AMIODARONE HCL IN DEXTROSE 360-4.14 MG/200ML-% IV SOLN
30.0000 mg/h | INTRAVENOUS | Status: DC
  Filled 2022-10-05: qty 200

## 2022-10-05 MED ORDER — METOPROLOL TARTRATE 25 MG PO TABS
25.0000 mg | ORAL_TABLET | Freq: Two times a day (BID) | ORAL | Status: DC
  Administered 2022-10-05 – 2022-10-07 (×5): 25 mg via ORAL
  Filled 2022-10-05 (×5): qty 1

## 2022-10-05 MED ORDER — ORAL CARE MOUTH RINSE: 15.0000 mL | OROMUCOSAL | Status: DC | PRN

## 2022-10-05 MED ORDER — AMIODARONE IV BOLUS ONLY 150 MG/100ML
150.0000 mg | Freq: Once | INTRAVENOUS | Status: DC
  Filled 2022-10-05: qty 100

## 2022-10-05 MED ORDER — ALUM & MAG HYDROXIDE-SIMETH 200-200-20 MG/5ML PO SUSP
30.0000 mL | ORAL | Status: DC | PRN
  Filled 2022-10-05: qty 30

## 2022-10-05 MED ORDER — MAGNESIUM SULFATE 4 GM/100ML IV SOLN
4.0000 g | Freq: Once | INTRAVENOUS | Status: AC
  Administered 2022-10-05: 4 g via INTRAVENOUS
  Filled 2022-10-05: qty 100

## 2022-10-05 MED ORDER — AMIODARONE HCL IN DEXTROSE 360-4.14 MG/200ML-% IV SOLN
60.0000 mg/h | INTRAVENOUS | Status: DC
  Filled 2022-10-05: qty 200

## 2022-10-05 MED ORDER — METOPROLOL TARTRATE 12.5 MG HALF TABLET: 12.5000 mg | ORAL_TABLET | Freq: Two times a day (BID) | ORAL | Status: DC

## 2022-10-05 MED ORDER — SODIUM CHLORIDE 0.9 % IV BOLUS
500.0000 mL | Freq: Once | INTRAVENOUS | Status: AC
  Administered 2022-10-05: 500 mL via INTRAVENOUS

## 2022-10-05 MED ORDER — MAGNESIUM OXIDE -MG SUPPLEMENT 400 (240 MG) MG PO TABS
400.0000 mg | ORAL_TABLET | Freq: Two times a day (BID) | ORAL | Status: AC
  Administered 2022-10-05 (×2): 400 mg via ORAL
  Filled 2022-10-05 (×2): qty 1

## 2022-10-05 MED ORDER — AMIODARONE LOAD VIA INFUSION
150.0000 mg | Freq: Once | INTRAVENOUS | Status: AC
  Administered 2022-10-05: 150 mg via INTRAVENOUS
  Filled 2022-10-05: qty 83.34

## 2022-10-05 MED ORDER — CHLORHEXIDINE GLUCONATE CLOTH 2 % EX PADS
6.0000 | MEDICATED_PAD | Freq: Every day | CUTANEOUS | Status: DC
  Administered 2022-10-05 – 2022-10-06 (×3): 6 via TOPICAL

## 2022-10-05 NOTE — Progress Notes (Signed)
Rapid Response Event Note   Reason for Call : pt HR sustaining 190's   Initial Focused Assessment: [facility], NP at the bedside.  Pt doesn't appear to be in distress.  Pt A/O x 4, co right arm pain.  Rated 2/10 states it's new pain for him.  Denies any other pain.  VS (HR 198,  Sats 100% on RA, rr 16, b/p 98/60(69).     Interventions:  see [facility] NP orders.   Plan of Care: Transfer to ICU per NP orders for closer monitoring and management of HR.    Event Summary:  Upon transferring pt to new room of 1232 in ICU pt converted back to SR on the monitor.   Conley Rolls, RN

## 2022-10-05 NOTE — Plan of Care (Signed)

## 2022-10-05 NOTE — Consult Note (Addendum)
Cardiology Consultation   Patient ID: Brandon Rubio. MRN: [id number]; DOB: 1950/12/05  Admit date: 10/02/2022 Date of Consult: 10/05/2022  PCP:  [name], MD   [facility] Providers Cardiologist:  None        Patient Profile:   Brandon Rubio. is a 72 y.o. male with a hx of NSTEMI in 2009 and cath with 99% distal LAD treated medically (Dr. 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 David Stall .  History of Present Illness:   [name] with above hx and cardiac hx of prior cath with distal LAD disease, 40% stenosis in ramus of LCX and 50% stenosis in PDA, irregularities in RCA.  EF at cath 60% in 2009 and Echo last year with EF 60-65% no RWMA, mod concentric LVH and valves normal.     Pt has recurrent and metastatic nn small cell lung cancer adenocarcinoma on palliative chemo.  Last chemo on 09/24/22.    Presented to Er from cancer center 10/02/22 for weakness and failure to thrive.  Afebrile, was hypovolemic, with hyponatremia, hypomagnesemia, DM2 with hyperglycemia   He developed SVT at 170s for 3 min on 10/04/22 and converted spontaneously to SR. On 10/05/22 he again had SVT to 190 for 60 min.  No acute symptoms.  He was given IV NS 500 cc and then amiodarone 150 mg bolus he converted back to SR 30 mni after beginning amio.  He was started on low dose BB.  Mg+ 1.4   EKG:  The EKG was personally reviewed and demonstrates:  SR with non specific T wave abnormalities.  EKG 10/05/22  SVT at 195 and ST depression most likely due to rate.  Follow up EKG SR with PVC   Telemetry:  Telemetry was personally reviewed and demonstrates:  SR now Labs today Na 137  K+ 3.2 glucose 215  Mg+ 1.4  HS troponin 20 today (rapid heart rate driver) on admit on the 40JW was 6 and 7.    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   Peripheral neuropathy    toes    Past Surgical History:  Procedure Laterality Date   BRONCHIAL BIOPSY  12/10/2020   Procedure: BRONCHIAL BIOPSIES;  Surgeon: Leslye Peer, MD;  Location: [facility] ENDOSCOPY;  Service: Pulmonary;;   BRONCHIAL BRUSHINGS  12/10/2020   Procedure: BRONCHIAL BRUSHINGS;  Surgeon: Leslye Peer, MD;  Location: [facility] ENDOSCOPY;  Service: Pulmonary;;   BRONCHIAL NEEDLE ASPIRATION BIOPSY  12/10/2020   Procedure: BRONCHIAL NEEDLE ASPIRATION BIOPSIES;  Surgeon: Leslye Peer, MD;  Location: [facility] ENDOSCOPY;  Service: Pulmonary;;   COLONOSCOPY     COLONOSCOPY W/ POLYPECTOMY  03/10/2009   LARYNGOSCOPY  03/10/1997   VIDEO BRONCHOSCOPY WITH ENDOBRONCHIAL NAVIGATION N/A 12/10/2020   Procedure: ROBOTIC VIDEO BRONCHOSCOPY WITH ENDOBRONCHIAL NAVIGATION;  Surgeon: Leslye Peer, MD;  Location: [facility] ENDOSCOPY;  Service: Pulmonary;  Laterality: N/A;     Home Medications:  Prior to Admission medications   Medication Sig Start Date End Date Taking? Authorizing Provider  aspirin EC 81 MG tablet Take 81 mg by mouth in the morning. Swallow whole.   Yes [provider]  bisacodyl (DULCOLAX) 5 MG EC tablet Take 5 mg by mouth daily as needed for moderate constipation.   Yes [provider]  folic acid (FOLVITE)  1 MG tablet TAKE 1 TABLET BY MOUTH EVERY DAY 09/27/22  Yes [name], MD  HYDROcodone-acetaminophen [facility]) 10-325 MG tablet Take 1 tablet by mouth every 12 (twelve) hours as needed. Patient taking differently: Take 1 tablet by mouth every 6 (six) hours. 09/16/22  Yes Crain, Whitney L, PA  metFORMIN (GLUCOPHAGE) 1000 MG tablet TAKE 1 TABLET(1000 MG) BY MOUTH TWICE DAILY WITH A MEAL Patient taking differently: Take 1,000 mg by mouth 2 (two) times daily with a meal. 06/20/22  Yes [name], MD  Multiple Vitamins-Minerals (CENTRUM SILVER 50+MEN) TABS Take 1 tablet by mouth daily with breakfast.   Yes [provider]  omeprazole (PRILOSEC) 20 MG capsule TAKE 1 CAPSULE BY MOUTH TWICE DAILY BEFORE A MEAL Patient taking differently: Take 20 mg by mouth 2 (two) times daily before a meal. 08/21/22  Yes [name], MD  ondansetron (ZOFRAN) 8 MG tablet Take 1 tablet (8 mg total) by mouth every 8 (eight) hours as needed for nausea or vomiting. 06/09/22  Yes Heilingoetter, [name], PA-C  predniSONE (DELTASONE) 5 MG tablet Take 1-1.5 tablets (5-7.5 mg total) by mouth daily with breakfast. Patient taking differently: Take 7.5 mg by mouth daily with breakfast. 09/22/22  Yes [name], MD  simvastatin (ZOCOR) 40 MG tablet TAKE 1 TABLET BY MOUTH EVERY EVENING AT 6 PM. Patient taking differently: Take 40 mg by mouth in the morning. 09/12/22  Yes [name], MD  HYDROcodone-acetaminophen [facility]) 10-325 MG tablet Take 1 tablet by mouth 2 (two) times daily as needed. Patient not taking: Reported on 10/02/2022 08/25/22   [name], MD  prochlorperazine (COMPAZINE) 10 MG tablet Take 1 tablet (10 mg total) by mouth every 6 (six) hours as needed for nausea or vomiting. Patient not taking: Reported on 10/02/2022 09/01/22 10/31/22  [name], MD    Inpatient Medications: Scheduled Meds:  aspirin EC  81 mg Oral q AM   Chlorhexidine Gluconate Cloth  6 each  Topical Daily   enoxaparin (LOVENOX) injection  40 mg Subcutaneous Q24H   feeding supplement  237 mL Oral BID BM   folic acid  1 mg Oral Daily   insulin aspart  0-5 Units Subcutaneous QHS   insulin aspart  0-9 Units Subcutaneous TID WC   magnesium oxide  400 mg Oral BID   metoprolol tartrate  12.5 mg Oral BID   multivitamin with minerals  1 tablet Oral Q breakfast   pantoprazole  40 mg Oral Daily   potassium chloride  40 mEq Oral BID   predniSONE  5 mg Oral QPM   predniSONE  7.5 mg Oral Q breakfast   simvastatin  40 mg Oral q1800   Continuous Infusions:  PRN Meds: acetaminophen, alum & mag hydroxide-simeth, HYDROcodone-acetaminophen,  melatonin, mouth rinse, polyethylene glycol, prochlorperazine  Allergies:    Allergies  Allergen Reactions   Penicillins Anaphylaxis, Swelling and Other (See Comments)    THE THROAT SWELLS!!!! Because of a history of documented adverse serious drug reaction;[name] Alert bracelet  is recommended   Albumin (Human) Hives and Itching    Social History:   Social History   Socioeconomic History   Marital status: Divorced    Spouse name: Not on file   Number of children: 0   Years of education: Not on file   Highest education level: Not on file  Occupational History   Occupation: Retired Personnel officer  Tobacco Use   Smoking status: Every Day    Current packs/day: 1.50    Average packs/day: 1.5 packs/day for 58.0 years (87.0 ttl pk-yrs)    Types: Cigarettes   Smokeless tobacco: Never   Tobacco comments:    2+ packs smoked daily [facility] 12/05/20  Vaping Use   Vaping status: Never Used  Substance and Sexual Activity   Alcohol use: No    Alcohol/week: 0.0 standard drinks of alcohol    Comment:  none since 04/2012   Drug use: Never   Sexual activity: Not on file  Other Topics Concern   Not on file  Social History Narrative   REG EXERCISE         Social Determinants of Health   Financial Resource Strain: Low Risk  (09/15/2022)   Overall Financial  Resource Strain (CARDIA)    Difficulty of Paying Living Expenses: Not hard at all  Food Insecurity: No Food Insecurity (10/02/2022)   Hunger Vital Sign    Worried About Running Out of Food in the Last Year: Never true    Ran Out of Food in the Last Year: Never true  Transportation Needs: No Transportation Needs (10/02/2022)   PRAPARE - Administrator, Civil Service (Medical): No    Lack of Transportation (Non-Medical): No  Physical Activity: Inactive (09/15/2022)   Exercise Vital Sign    Days of Exercise per Week: 0 days    Minutes of Exercise per Session: 0 min  Stress: No Stress Concern Present (09/15/2022)   [employer] of Occupational Health - Occupational Stress Questionnaire    Feeling of Stress : Not at all  Social Connections: Socially Isolated (09/15/2022)   Social Connection and Isolation Panel [NHANES]    Frequency of Communication with Friends and Family: More than three times a week    Frequency of Social Gatherings with Friends and Family: Three times a week    Attends Religious Services: Never    Active Member of Clubs or Organizations: No    Attends Banker Meetings: Never    Marital Status: Divorced  Catering manager Violence: Not At Risk (10/02/2022)   Humiliation, Afraid, Rape, and Kick questionnaire    Fear of Current or Ex-Partner: No    Emotionally Abused: No    Physically Abused: No    Sexually Abused: No    Family History:    Family History  Problem Relation Age of Onset   Lung cancer Mother        X 2   Colon polyps Father    Colon cancer Father 7   Coronary artery disease Father    Lung disease Father    Esophageal cancer Neg Hx    Stomach cancer Neg Hx    Rectal cancer Neg Hx      ROS:  Please see the history of present illness.  General:no colds or fevers, no weight changes Skin:no rashes or ulcers HEENT:no blurred vision, no congestion CV:see HPI PUL:see HPI GI:no diarrhea constipation or melena, no  indigestion GU:no hematuria, no dysuria MS:no joint pain, no claudication Neuro:no syncope, no lightheadedness Endo:+ diabetes, no thyroid disease  All other ROS reviewed and negative.     Physical Exam/Data:   Vitals:   10/05/22 0405 10/05/22 0500 10/05/22 0600 10/05/22 0700  BP:  109/62 104/86 (!) 117/57  Pulse:  76 67 71  Resp:  (!) 0 (!) 22 19  Temp: 97.8 F (36.6 C)     TempSrc: Oral     SpO2:  100% 100% 100%  Weight:      Height:        Intake/Output Summary (Last 24 hours) at 10/05/2022 0749 Last data filed at 10/05/2022 7829 Gross per 24 hour  Intake 299.02 ml  Output --  Net 299.02 ml      10/05/2022    2:20 AM 10/02/2022    3:43 PM 10/02/2022   12:51 PM  Last 3 Weights  Weight (lbs) 185 lb 6.5 oz 177 lb 7.5 oz 177 lb 9.6 oz  Weight (kg) 84.1 kg 80.5 kg 80.559 kg     Body mass index is 25.15 kg/m.    GEN: Well nourished, well developed, in no acute distress HEENT: normal Neck: no JVD, carotid bruits, or masses Cardiac: RRR; no murmurs, rubs, or gallops,no edema  Respiratory:  clear to auscultation bilaterally, normal work of breathing GI: soft, nontender, nondistended, + BS MS: no deformity or atrophy Skin: warm and dry, no rash Neuro:  Alert and Oriented x 3, Strength and sensation are intact Psych: euthymic mood, full affect    Relevant CV Studies: Cardiac cath 2009 with  distal LAD disease of 99%, 40% stenosis in ramus of LCX and 50% stenosis in PDA, irregularities in RCA.  EF at cath 60%.  medically managed   Echo 05/2021 IMPRESSIONS     1. Left ventricular ejection fraction, by estimation, is 60 to 65%. The  left ventricle has normal function. The left ventricle has no regional  wall motion abnormalities. There is moderate concentric left ventricular  hypertrophy. Left ventricular  diastolic parameters were normal.   2. Right ventricular systolic function was not well visualized. The right  ventricular size is not well visualized.   3. The  mitral valve is grossly normal. No evidence of mitral valve  regurgitation.   4. The aortic valve is normal in structure. Aortic valve regurgitation is  not visualized. No aortic stenosis is present.   5. There is dilatation of the ascending aorta.   FINDINGS   Left Ventricle: Left ventricular ejection fraction, by estimation, is 60  to 65%. The left ventricle has normal function. The left ventricle has no  regional wall motion abnormalities. The left ventricular internal cavity  size was normal in size. There is   moderate concentric left ventricular hypertrophy. Left ventricular  diastolic parameters were normal.   Right Ventricle: The right ventricular size is not well visualized. Right  vetricular wall thickness was not well visualized. Right ventricular  systolic function was not well visualized.   Left Atrium: Left atrial size was normal in size.   Right Atrium: Right atrial size was normal in size.   Pericardium: There is no evidence of pericardial effusion.   Mitral Valve: The mitral valve is grossly normal. No evidence of mitral  valve regurgitation.   Tricuspid Valve: The tricuspid valve is grossly  normal. Tricuspid valve  regurgitation is not demonstrated.   Aortic Valve: The aortic valve is normal in structure. Aortic valve  regurgitation is not visualized. No aortic stenosis is present. Aortic  valve peak gradient measures 5.2 mmHg.   Pulmonic Valve: The pulmonic valve was not well visualized. Pulmonic valve  regurgitation is not visualized.   Aorta: The aortic root and ascending aorta are structurally normal, with  no evidence of dilitation. There is dilatation of the ascending aorta.   IAS/Shunts: The atrial septum is grossly normal.   Laboratory Data:  High Sensitivity Troponin:   Recent Labs  Lab 10/02/22 1640 10/02/22 2102 10/05/22 0235  TROPONINIHS 7 6 20*     Chemistry Recent Labs  Lab 10/03/22 0452 10/04/22 0350 10/05/22 0235  NA 132* 138  137  K 3.7 3.5 3.2*  CL 99 106 105  CO2 21* 24 21*  GLUCOSE 122* 159* 215*  BUN 20 20 24*  CREATININE 0.90 0.78 1.06  CALCIUM 8.9 8.7* 8.0*  MG 2.1 1.7 1.4*  GFRNONAA >60 >60 >60  ANIONGAP 12 8 11     Recent Labs  Lab 10/02/22 1255  PROT 7.7  ALBUMIN 4.1  AST 29  ALT 22  ALKPHOS 62  BILITOT 0.5   Lipids No results for input(s): "CHOL", "TRIG", "HDL", "LABVLDL", "LDLCALC", "CHOLHDL" in the last 168 hours.  Hematology Recent Labs  Lab 10/02/22 1255 10/03/22 0452  WBC 4.4 2.9*  RBC 3.61* 3.11*  HGB 12.0* 10.1*  HCT 33.8* 29.7*  MCV 93.6 95.5  MCH 33.2 32.5  MCHC 35.5 34.0  RDW 12.5 12.5  PLT 150 150   Thyroid No results for input(s): "TSH", "FREET4" in the last 168 hours.  BNP Recent Labs  Lab 10/02/22 1640  BNP 37.6    DDimer No results for input(s): "DDIMER" in the last 168 hours.   Radiology/Studies:  DG Chest 2 View  Result Date: 10/02/2022 CLINICAL DATA:  Weakness EXAM: CHEST - 2 VIEW COMPARISON:  CT chest 08/29/2022 and radiograph 05/24/2021 FINDINGS: Stable cardiomediastinal silhouette given patient rotation. Aortic atherosclerotic calcification. Right midlung scarring/atelectasis. Nodular opacities in the left mid and lower lung better assessed on recent CT chest 08/29/2022. No pleural effusion or pneumothorax. No displaced rib fractures. IMPRESSION: Nodular opacities in the left mid and lower lung are grossly similar to CT 08/29/2022. Electronically Signed   By: Minerva Fester M.D.   On: 10/02/2022 16:48     Assessment and Plan:   PSVT with rates to 195.  Treated with amiodarone 150 mg bolus and converted to SR.  + electrolyte abnormality with hypo k+ and Mg+  now on low dose BB, which I increased to 25 mg twice daily from 12.5..  Will recheck echo. Previously has been normal EF.  Will try to avoid use of amiodarone long-term.  Hopefully beta-blocker will do the trick for him.  Obviously keeping magnesium and potassium in range would be helpful as  well. Mild elevated tropnin with no chest pain.  Most likely elevated with rapid HR. this is not acute coronary syndrome. CAD with prior NSTEMI in 2009 and occluded distal LAD treated medically and some LCX disease, no anginal symptoms Recurrent metastatic lung cancer with palliative Chemo last dose 09/24/22.  Per oncology, notes reviewed Hypokalemia and hypomagnesemia per IM, appreciate Dr. David Stall Failure to thrive/DM2/GERD/HLD/Adrenal insuff/tobacco disorder per IM   Risk Assessment/Risk Scores:                For questions or updates, please  contact [facility] Please consult [email or web address] for contact info under   Note prepped with assistance from Nada Boozer, NP  Signed, Donato Schultz, MD 10/05/2022 7:49 AM

## 2022-10-05 NOTE — Progress Notes (Signed)
[facility] PROGRESS NOTE    Progress Note  Brandon Rubio.  GLO:[id number] DOB: 1950/06/30 DOA: 10/02/2022 PCP: [name], MD     Brief Narrative:   Brandon Shroff. is an 72 y.o. 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  Except for his white blood cell count which was 2.9 and a hemoglobin of 12  Assessment/Plan:   Failure to thrive in adult/with a history of noncompliance with the steroids in the past: Of unclear etiology question with adrenal insufficiency in the setting of chemotherapy. Creatinine has improved. Out of bed to chair, no PT follow-up.  Hyponatremia: Specific gravity in the UA was 1020. Improved. Question of due to noncompliance with the steroids.  Sustained supraventricular tachycardia: Heart rate in the floor yesterday was 190 endorses feeling cold, denies chest pain or palpitations.  Last year 2D echo showed an EF of 60% no wall motion abnormality diastolic parameters unremarkable. Started on amiodarone bolus converted back to sinus rhythm 30 minutes after starting the bolus, hemodynamically stable. Try to keep potassium greater than 4 magnesium greater than 2. Magnesium is 1.4 will replete orally recheck tomorrow morning. Continue replete potassium orally recheck tomorrow morning. Blood pressure stable started on low-dose beta-blockers. Consult cardiology.  Hypomagnesemia: Magnesium was 1.4, start oral repletion.  Hypokalemia: Replete orally recheck in the morning.  High anion gap metabolic  acidosis: Setting of malignancy, on admission anion gap was 18 after IV fluids and now is 12.   Diabetes mellitus type 2 with complications (HCC) Last A1c of 6.8 with hyperglycemia. Continue sliding scale insulin.  GERD: Continue PPI.  Hyperlipidemia: continue statins.  Recurrent metastatic non-small cell adenocarcinoma: On palliative chemotherapy last treatment on 09/24/2022.  Relative adrenal insufficiency: Resume home dose of steroids she was given a stress dose in the ED.  Tobacco disorder: Continues to smoke he has been counseled declined Chantix and nicotine patch.    DVT prophylaxis: lovenox Family Communication:none Status is: Observation The patient remains OBS appropriate and will d/c before 2 midnights.    Code Status:     Code Status Orders  (From admission, onward)           Start     Ordered   10/02/22 1939  Full code  Continuous       Question:  By:  Answer:  Consent: discussion documented in EHR   10/02/22 1938           Code Status History     Date Active Date Inactive Code Status Order ID Comments User Context   05/23/2021 1642 05/27/2021 1959 Full Code [id number]  Drema Dallas, MD ED   05/10/2021 1919 05/16/2021 1706 Full Code [id number]  Albertine Grates, MD Inpatient   03/07/2021 2043 03/09/2021 1831 Full Code [id number]  Howerter, Chaney Born, DO Inpatient         IV Access:   Peripheral IV   Procedures and diagnostic studies:   No results found.   Medical Consultants:   None.   Subjective:    Brandon Rubio. denies any chest pain feels better this morning.  Objective:    Vitals:  10/05/22 0405 10/05/22 0500 10/05/22 0600 10/05/22 0700  BP:  109/62 104/86 (!) 117/57  Pulse:  76 67 71  Resp:  (!) 0 (!) 22 19  Temp: 97.8 F (36.6 C)     TempSrc: Oral     SpO2:  100% 100% 100%  Weight:      Height:       SpO2: 100 % O2 Flow Rate (L/min): 2 L/min   Intake/Output Summary (Last 24 hours) at 10/05/2022 0721 Last  data filed at 10/05/2022 1308 Gross per 24 hour  Intake 299.02 ml  Output --  Net 299.02 ml   Filed Weights   10/02/22 1543 10/05/22 0220  Weight: 80.5 kg 84.1 kg    Exam: General exam: In no acute distress. Respiratory system: Good air movement and clear to auscultation. Cardiovascular system: S1 & S2 heard, RRR. No JVD. Gastrointestinal system: Abdomen is nondistended, soft and nontender.  Extremities: No pedal edema. Skin: No rashes, lesions or ulcers Psychiatry: Judgement and insight appear normal. Mood & affect appropriate.  Data Reviewed:    Labs: Basic Metabolic Panel: Recent Labs  Lab 10/02/22 1255 10/03/22 0452 10/04/22 0350 10/05/22 0235  NA 134* 132* 138 137  K 3.7 3.7 3.5 3.2*  CL 99 99 106 105  CO2 17* 21* 24 21*  GLUCOSE 144* 122* 159* 215*  BUN 24* 20 20 24*  CREATININE 1.19 0.90 0.78 1.06  CALCIUM 9.8 8.9 8.7* 8.0*  MG 1.3* 2.1 1.7 1.4*  PHOS  --  3.1 1.7* 3.0   GFR Estimated Creatinine Clearance: 67.1 mL/min (by C-G formula based on SCr of 1.06 mg/dL). Liver Function Tests: Recent Labs  Lab 10/02/22 1255  AST 29  ALT 22  ALKPHOS 62  BILITOT 0.5  PROT 7.7  ALBUMIN 4.1   No results for input(s): "LIPASE", "AMYLASE" in the last 168 hours. No results for input(s): "AMMONIA" in the last 168 hours. Coagulation profile No results for input(s): "INR", "PROTIME" in the last 168 hours. COVID-19 Labs  No results for input(s): "DDIMER", "FERRITIN", "LDH", "CRP" in the last 72 hours.  Lab Results  Component Value Date   SARSCOV2NAA NEGATIVE 05/23/2021   SARSCOV2NAA NEGATIVE 05/10/2021   SARSCOV2NAA NEGATIVE 05/06/2021   SARSCOV2NAA NEGATIVE 03/07/2021    CBC: Recent Labs  Lab 10/02/22 1255 10/03/22 0452  WBC 4.4 2.9*  NEUTROABS 2.3  --   HGB 12.0* 10.1*  HCT 33.8* 29.7*  MCV 93.6 95.5  PLT 150 150   Cardiac Enzymes: No results for input(s): "CKTOTAL", "CKMB", "CKMBINDEX", "TROPONINI" in the last 168 hours. BNP (last 3  results) No results for input(s): "PROBNP" in the last 8760 hours. CBG: Recent Labs  Lab 10/04/22 0726 10/04/22 1144 10/04/22 1654 10/04/22 2057 10/05/22 0117  GLUCAP 116* 212* 185* 169* 176*   D-Dimer: No results for input(s): "DDIMER" in the last 72 hours. Hgb A1c: No results for input(s): "HGBA1C" in the last 72 hours. Lipid Profile: No results for input(s): "CHOL", "HDL", "LDLCALC", "TRIG", "CHOLHDL", "LDLDIRECT" in the last 72 hours. Thyroid function studies: No results for input(s): "TSH", "T4TOTAL", "T3FREE", "THYROIDAB" in the last 72 hours.  Invalid input(s): "FREET3" Anemia work up: No results for input(s): "VITAMINB12", "FOLATE", "FERRITIN", "TIBC", "IRON", "RETICCTPCT" in the last 72 hours. Sepsis Labs: Recent Labs  Lab 10/02/22 1255 10/03/22 0452  WBC 4.4 2.9*   Microbiology Recent Results (from the past 240 hour(s))  MRSA Next Gen by PCR, Nasal     Status: None   Collection Time: 10/05/22  2:40 AM   Specimen: Nasal Mucosa; Nasal Swab  Result Value Ref Range Status   MRSA by PCR Next Gen NOT DETECTED NOT DETECTED Final    Comment: (NOTE) The GeneXpert MRSA Assay (FDA approved for NASAL specimens only), is one component of a comprehensive MRSA colonization surveillance program. It is not intended to diagnose MRSA infection nor to guide or monitor treatment for MRSA infections. Test performance is not FDA approved in patients less than 97 years old. Performed at [facility], [address], [place], [place] [address]      Medications:    aspirin EC  81 mg Oral q AM   Chlorhexidine Gluconate Cloth  6 each Topical Daily   enoxaparin (LOVENOX) injection  40 mg Subcutaneous Q24H   feeding supplement  237 mL Oral BID BM   folic acid  1 mg Oral Daily   insulin aspart  0-5 Units Subcutaneous QHS   insulin aspart  0-9 Units Subcutaneous TID WC   multivitamin with minerals  1 tablet Oral Q breakfast   pantoprazole  40 mg Oral Daily    potassium chloride  40 mEq Oral BID   predniSONE  2.5 mg Oral QPM   predniSONE  7.5 mg Oral Q breakfast   simvastatin  40 mg Oral q1800   Continuous Infusions:      LOS: 2 days   Marinda Elk  [facility]  10/05/2022, 7:21 AM

## 2022-10-05 NOTE — Plan of Care (Signed)
PT has not had tachycardic episodes and has completed large portion of his meals. PT is relatively independent and did his own bath and personal care. PT meets goals for discharge.

## 2022-10-05 NOTE — Progress Notes (Addendum)
    Patient Name: Brandon Rubio.           DOB: 02/17/1951  MRN: [id number]      Admission Date: 10/02/2022  Attending Provider: Marinda Elk, MD  Primary Diagnosis: Failure to thrive in adult   Level of care: Med-Surg    CROSS COVER NOTE   Date of Service   10/05/2022   Brandon Muscat., 72 y.o. male, was admitted on 10/02/2022 for Failure to thrive in adult.    HPI/Events of Note   Supraventricular tachycardia, sustaining HR 190s for > 60 minutes.  At bedside pt does not appear to be in acute distress.  Endorses feeling cool, indigestion, palpitations. Denies lightheadedness, CP, SOB, abdominal discomfort, N/ V.  No other acute changes reported, mentation at baseline. SBP 70-90's.  No IV access at this time.  Nursing staff working on IV placement so fluids and amiodarone can be started. Requiring higher level of care, will transfer to SDU.   Update: While finishing up amiodarone bolus, pt converted back to NSR with PVC's no ST segment changes (captured on EKG). Hemodynamically stable. No further c/o at this time.     Interventions/ Plan   EKG--> SVT with HR 195 Maalox  NS Bolus, 500 cc Amiodarone bolus, 150 mg Transfer to SDU Labs- bmp, mag. Phosph         Anthoney Harada, DNP, ACNPC- AG [facility]

## 2022-10-06 ENCOUNTER — Inpatient Hospital Stay (HOSPITAL_COMMUNITY): Payer: Medicare Other

## 2022-10-06 DIAGNOSIS — C3491 Malignant neoplasm of unspecified part of right bronchus or lung: Secondary | ICD-10-CM | POA: Diagnosis not present

## 2022-10-06 DIAGNOSIS — I471 Supraventricular tachycardia, unspecified: Secondary | ICD-10-CM | POA: Diagnosis not present

## 2022-10-06 DIAGNOSIS — R531 Weakness: Secondary | ICD-10-CM | POA: Diagnosis not present

## 2022-10-06 DIAGNOSIS — R9431 Abnormal electrocardiogram [ECG] [EKG]: Secondary | ICD-10-CM | POA: Diagnosis not present

## 2022-10-06 DIAGNOSIS — R627 Adult failure to thrive: Secondary | ICD-10-CM | POA: Diagnosis not present

## 2022-10-06 LAB — GLUCOSE, CAPILLARY
Glucose-Capillary: 118 mg/dL — ABNORMAL HIGH (ref 70–99)
Glucose-Capillary: 185 mg/dL — ABNORMAL HIGH (ref 70–99)
Glucose-Capillary: 196 mg/dL — ABNORMAL HIGH (ref 70–99)
Glucose-Capillary: 180 mg/dL — ABNORMAL HIGH (ref 70–99)

## 2022-10-06 LAB — ECHOCARDIOGRAM COMPLETE
Area-P 1/2: 1.87 cm2
Calc EF: 56.5 %
Height: 72 in
MV VTI: 3.22 cm2
S' Lateral: 3.4 cm
Single Plane A2C EF: 56.1 %
Single Plane A4C EF: 56.5 %
Weight: 2966.51 oz

## 2022-10-06 MED ORDER — MAGNESIUM OXIDE -MG SUPPLEMENT 400 (240 MG) MG PO TABS
400.0000 mg | ORAL_TABLET | Freq: Two times a day (BID) | ORAL | Status: AC
  Administered 2022-10-06 (×2): 400 mg via ORAL
  Filled 2022-10-06 (×2): qty 1

## 2022-10-06 MED ORDER — PERFLUTREN LIPID MICROSPHERE
1.0000 mL | INTRAVENOUS | Status: AC | PRN
  Administered 2022-10-06: 2 mL via INTRAVENOUS

## 2022-10-06 MED ORDER — MAGNESIUM SULFATE 4 GM/100ML IV SOLN
4.0000 g | Freq: Once | INTRAVENOUS | Status: AC
  Administered 2022-10-06: 4 g via INTRAVENOUS
  Filled 2022-10-06: qty 100

## 2022-10-06 NOTE — Progress Notes (Signed)
Occupational Therapy Treatment Patient Details Name: Brandon Rubio. MRN: [id number] DOB: 08/02/1950 Today's Date: 10/06/2022   History of present illness 72 y.o. 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 Dx of hyponatremia, metabolic acidosis.   OT comments  All goals were met. Patient reported being at baseline at this time. Patient provided with ECT handouts. D/c skilled OT services on this date. OT signing off.    Recommendations for follow up therapy are one component of a multi-disciplinary discharge planning process, led by the attending physician.  Recommendations may be updated based on patient status, additional functional criteria and insurance authorization.       Patient can return home with the following  Assistance with cooking/housework   Equipment Recommendations  Tub/shower seat       Precautions / Restrictions Precautions Precautions: None Restrictions Weight Bearing Restrictions: No       Mobility Bed Mobility Overal bed mobility: Independent                                     ADL either performed or assessed with clinical judgement   ADL         General ADL Comments: patient was educated on ECT and fall prevention strategies. patient reported that he is "back to normal" and was ready to go home. patient was provided with ECt hand outs to review. patient was able to complete shower transfer with MI in room. patient up in room with MI at end of session.      Cognition Arousal/Alertness: Awake/alert Behavior During Therapy: WFL for tasks assessed/performed Overall Cognitive Status: Within Functional Limits for tasks assessed                            Pertinent Vitals/ Pain       Pain Assessment Pain Assessment: No/denies pain         Frequency  Min 1X/week        Progress Toward Goals  OT Goals(current goals can now be found in the care plan section)  Progress towards OT goals: Goals met/education completed, patient discharged from OT     Plan All goals met and education completed, patient discharged from OT services       AM-PAC OT "6 Clicks" Daily Activity     Outcome Measure   Help from another person eating meals?: None Help from another person taking care of personal grooming?: None Help from another person toileting, which includes using toliet, bedpan, or urinal?: None Help from another person bathing (including washing, rinsing, drying)?: None Help from another person to put on and taking off regular upper body clothing?: None Help from another person to put on and taking off regular lower body clothing?: None 6 Click Score: 24    End of Session    OT Visit Diagnosis: Muscle weakness (generalized) (M62.81)   Activity Tolerance Patient tolerated treatment well   Patient Left in bed;with call bell/phone within reach   Nurse Communication Other (comment) (ok to participate in session)        Time: 4098-1191 OT Time Calculation (min): 10 min  Charges: OT General Charges $OT Visit: 1  Visit OT Treatments $Self Care/Home Management : 8-22 mins  Rosalio Loud, MS Acute Rehabilitation Department Office# [phone]   Selinda Flavin 10/06/2022, 3:48 PM

## 2022-10-06 NOTE — Progress Notes (Signed)
[facility] PROGRESS NOTE    Progress Note  Brandon Rubio.  AOZ:[id number] DOB: 11-Jul-1950 DOA: 10/02/2022 PCP: [name], MD     Brief Narrative:   Brandon Burrowes. is an 72 y.o. 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  Except for his white blood cell count which was 2.9 and a hemoglobin of 12  Assessment/Plan:   Failure to thrive in adult/with a history of noncompliance with the steroids in the past: Of unclear etiology question with adrenal insufficiency in the setting of chemotherapy. Creatinine has improved. Out of bed to chair, no PT follow-up.  Hyponatremia: Specific gravity in the UA was 1020. Improved. Question of due to noncompliance with the steroids.  Sustained supraventricular tachycardia: Started on metoprolol, cardiology was consulted agree with management. No further events overnight. Try to keep potassium greater than 4 magnesium greater than 2. Blood pressure stable transferred to MedSurg.  Hypomagnesemia: Mag 1.7 try to keep greater than 2.  Hypokalemia: Replete orally recheck in the morning.  High anion gap metabolic acidosis: Setting of malignancy, on admission anion gap was 18 after IV fluids and now is 12.   Diabetes mellitus type 2 with complications (HCC) Last A1c of 6.8 with hyperglycemia. Continue sliding scale insulin.  GERD: Continue PPI.  Hyperlipidemia: continue statins.  Recurrent metastatic non-small cell adenocarcinoma: On palliative chemotherapy last treatment on  09/24/2022.  Relative adrenal insufficiency: Resume home dose of steroids she was given a stress dose in the ED.  Tobacco disorder: Continues to smoke he has been counseled declined Chantix and nicotine patch.    DVT prophylaxis: lovenox Family Communication:none Status is: Observation The patient remains OBS appropriate and will d/c before 2 midnights.    Code Status:     Code Status Orders  (From admission, onward)           Start     Ordered   10/02/22 1939  Full code  Continuous       Question:  By:  Answer:  Consent: discussion documented in EHR   10/02/22 1938           Code Status History     Date Active Date Inactive Code Status Order ID Comments User Context   05/23/2021 1642 05/27/2021 1959 Full Code [id number]  Drema Dallas, MD ED   05/10/2021 1919 05/16/2021 1706 Full Code [id number]  Albertine Grates, MD Inpatient   03/07/2021 2043 03/09/2021 1831 Full Code [id number]  Howerter, Chaney Born, DO Inpatient         IV Access:   Peripheral IV   Procedures and diagnostic studies:   No results found.   Medical Consultants:   None.   Subjective:    Brandon Rubio. no complaints this morning feels great.  Objective:    Vitals:   10/06/22 0100 10/06/22 0200 10/06/22 0400 10/06/22 0600  BP:  119/63 126/66 113/64  Pulse: 70 69 66 64  Resp: 14 14 14 17   Temp:   98.1 F (36.7 C)   TempSrc:   Oral   SpO2: 95% 96% 98% 93%  Weight:      Height:  SpO2: 93 % O2 Flow Rate (L/min): 2 L/min  No intake or output data in the 24 hours ending 10/06/22 0708  Filed Weights   10/02/22 1543 10/05/22 0220  Weight: 80.5 kg 84.1 kg    Exam: General exam: In no acute distress. Respiratory system: Good air movement and clear to auscultation. Cardiovascular system: S1 & S2 heard, RRR. No JVD. Gastrointestinal system: Abdomen is nondistended, soft and nontender.  Extremities: No pedal edema. Skin: No rashes, lesions or ulcers Psychiatry:  Judgement and insight appear normal. Mood & affect appropriate.  Data Reviewed:    Labs: Basic Metabolic Panel: Recent Labs  Lab 10/03/22 0452 10/04/22 0350 10/05/22 0235 10/05/22 1750 10/06/22 0251  NA 132* 138 137 139 138  K 3.7 3.5 3.2* 4.1 4.3  CL 99 106 105 106 105  CO2 21* 24 21* 26 26  GLUCOSE 122* 159* 215* 148* 157*  BUN 20 20 24* 21 19  CREATININE 0.90 0.78 1.06 0.78 0.74  CALCIUM 8.9 8.7* 8.0* 8.4* 8.5*  MG 2.1 1.7 1.4* 2.0 1.7  PHOS 3.1 1.7* 3.0  --   --    GFR Estimated Creatinine Clearance: 88.9 mL/min (by C-G formula based on SCr of 0.74 mg/dL). Liver Function Tests: Recent Labs  Lab 10/02/22 1255  AST 29  ALT 22  ALKPHOS 62  BILITOT 0.5  PROT 7.7  ALBUMIN 4.1   No results for input(s): "LIPASE", "AMYLASE" in the last 168 hours. No results for input(s): "AMMONIA" in the last 168 hours. Coagulation profile No results for input(s): "INR", "PROTIME" in the last 168 hours. COVID-19 Labs  No results for input(s): "DDIMER", "FERRITIN", "LDH", "CRP" in the last 72 hours.  Lab Results  Component Value Date   SARSCOV2NAA NEGATIVE 05/23/2021   SARSCOV2NAA NEGATIVE 05/10/2021   SARSCOV2NAA NEGATIVE 05/06/2021   SARSCOV2NAA NEGATIVE 03/07/2021    CBC: Recent Labs  Lab 10/02/22 1255 10/03/22 0452  WBC 4.4 2.9*  NEUTROABS 2.3  --   HGB 12.0* 10.1*  HCT 33.8* 29.7*  MCV 93.6 95.5  PLT 150 150   Cardiac Enzymes: No results for input(s): "CKTOTAL", "CKMB", "CKMBINDEX", "TROPONINI" in the last 168 hours. BNP (last 3 results) No results for input(s): "PROBNP" in the last 8760 hours. CBG: Recent Labs  Lab 10/05/22 0755 10/05/22 1158 10/05/22 1625 10/05/22 2103 10/05/22 2302  GLUCAP 125* 169* 139* 191* 185*   D-Dimer: No results for input(s): "DDIMER" in the last 72 hours. Hgb A1c: No results for input(s): "HGBA1C" in the last 72 hours. Lipid Profile: No results for input(s): "CHOL", "HDL", "LDLCALC", "TRIG", "CHOLHDL", "LDLDIRECT" in the  last 72 hours. Thyroid function studies: No results for input(s): "TSH", "T4TOTAL", "T3FREE", "THYROIDAB" in the last 72 hours.  Invalid input(s): "FREET3" Anemia work up: No results for input(s): "VITAMINB12", "FOLATE", "FERRITIN", "TIBC", "IRON", "RETICCTPCT" in the last 72 hours. Sepsis Labs: Recent Labs  Lab 10/02/22 1255 10/03/22 0452  WBC 4.4 2.9*   Microbiology Recent Results (from the past 240 hour(s))  MRSA Next Gen by PCR, Nasal     Status: None   Collection Time: 10/05/22  2:40 AM   Specimen: Nasal Mucosa; Nasal Swab  Result Value Ref Range Status   MRSA by PCR Next Gen NOT DETECTED NOT DETECTED Final    Comment: (NOTE) The GeneXpert MRSA Assay (FDA approved for NASAL specimens only), is one component of a comprehensive MRSA colonization surveillance program. It is not intended to diagnose MRSA infection nor to guide or monitor treatment for MRSA infections.  Test performance is not FDA approved in patients less than 85 years old. Performed at [facility], [address], [place], [place] [address]      Medications:    aspirin EC  81 mg Oral q AM   Chlorhexidine Gluconate Cloth  6 each Topical Daily   enoxaparin (LOVENOX) injection  40 mg Subcutaneous Q24H   feeding supplement  237 mL Oral BID BM   folic acid  1 mg Oral Daily   insulin aspart  0-5 Units Subcutaneous QHS   insulin aspart  0-9 Units Subcutaneous TID WC   metoprolol tartrate  25 mg Oral BID   multivitamin with minerals  1 tablet Oral Q breakfast   pantoprazole  40 mg Oral Daily   predniSONE  5 mg Oral QPM   predniSONE  7.5 mg Oral Q breakfast   simvastatin  40 mg Oral q1800   Continuous Infusions:      LOS: 3 days   Marinda Elk  [facility]  10/06/2022, 7:08 AM

## 2022-10-06 NOTE — Progress Notes (Signed)
Rounding Note    Patient Name: Brandon Rubio. Date of Encounter: 10/06/2022  [facility] Cardiologist: Donato Schultz, MD   Subjective   No cardiology complaints, anticipating moving to the floor today.  Inpatient Medications    Scheduled Meds:  aspirin EC  81 mg Oral q AM   Chlorhexidine Gluconate Cloth  6 each Topical Daily   enoxaparin (LOVENOX) injection  40 mg Subcutaneous Q24H   feeding supplement  237 mL Oral BID BM   folic acid  1 mg Oral Daily   insulin aspart  0-5 Units Subcutaneous QHS   insulin aspart  0-9 Units Subcutaneous TID WC   magnesium oxide  400 mg Oral BID   metoprolol tartrate  25 mg Oral BID   multivitamin with minerals  1 tablet Oral Q breakfast   pantoprazole  40 mg Oral Daily   predniSONE  5 mg Oral QPM   predniSONE  7.5 mg Oral Q breakfast   simvastatin  40 mg Oral q1800   Continuous Infusions:  PRN Meds: acetaminophen, alum & mag hydroxide-simeth, HYDROcodone-acetaminophen, melatonin, mouth rinse, polyethylene glycol, prochlorperazine   Vital Signs    Vitals:   10/06/22 0100 10/06/22 0200 10/06/22 0400 10/06/22 0600  BP:  119/63 126/66 113/64  Pulse: 70 69 66 64  Resp: 14 14 14 17   Temp:   98.1 F (36.7 C)   TempSrc:   Oral   SpO2: 95% 96% 98% 93%  Weight:      Height:       No intake or output data in the 24 hours ending 10/06/22 0746    10/05/2022    2:20 AM 10/02/2022    3:43 PM 10/02/2022   12:51 PM  Last 3 Weights  Weight (lbs) 185 lb 6.5 oz 177 lb 7.5 oz 177 lb 9.6 oz  Weight (kg) 84.1 kg 80.5 kg 80.559 kg      Telemetry    Sinus rhythm 60-80s, PVCs, PACs, no further SVT - Personally Reviewed  ECG    No new tracings - Personally Reviewed  Physical Exam   GEN: No acute distress.   Neck: No JVD Cardiac: RRR, no murmurs, rubs, or gallops.  Respiratory: no cracklees GI: Soft, nontender, non-distended  MS: No edema; No deformity. Neuro:  Nonfocal  Psych: Normal affect   Labs    High  Sensitivity Troponin:   Recent Labs  Lab 10/02/22 1640 10/02/22 2102 10/05/22 0235  TROPONINIHS 7 6 20*     Chemistry Recent Labs  Lab 10/02/22 1255 10/03/22 0452 10/05/22 0235 10/05/22 1750 10/06/22 0251  NA 134*   < > 137 139 138  K 3.7   < > 3.2* 4.1 4.3  CL 99   < > 105 106 105  CO2 17*   < > 21* 26 26  GLUCOSE 144*   < > 215* 148* 157*  BUN 24*   < > 24* 21 19  CREATININE 1.19   < > 1.06 0.78 0.74  CALCIUM 9.8   < > 8.0* 8.4* 8.5*  MG 1.3*   < > 1.4* 2.0 1.7  PROT 7.7  --   --   --   --   ALBUMIN 4.1  --   --   --   --   AST 29  --   --   --   --   ALT 22  --   --   --   --   ALKPHOS 62  --   --   --   --  BILITOT 0.5  --   --   --   --   GFRNONAA >60   < > >60 >60 >60  ANIONGAP 18*   < > 11 7 7    < > = values in this interval not displayed.    Lipids No results for input(s): "CHOL", "TRIG", "HDL", "LABVLDL", "LDLCALC", "CHOLHDL" in the last 168 hours.  Hematology Recent Labs  Lab 10/02/22 1255 10/03/22 0452  WBC 4.4 2.9*  RBC 3.61* 3.11*  HGB 12.0* 10.1*  HCT 33.8* 29.7*  MCV 93.6 95.5  MCH 33.2 32.5  MCHC 35.5 34.0  RDW 12.5 12.5  PLT 150 150   Thyroid No results for input(s): "TSH", "FREET4" in the last 168 hours.  BNP Recent Labs  Lab 10/02/22 1640  BNP 37.6    DDimer No results for input(s): "DDIMER" in the last 168 hours.   Radiology    No results found.  Cardiac Studies   Echo pending  Echo 05/2021:  1. Left ventricular ejection fraction, by estimation, is 60 to 65%. The  left ventricle has normal function. The left ventricle has no regional  wall motion abnormalities. There is moderate concentric left ventricular  hypertrophy. Left ventricular  diastolic parameters were normal.   2. Right ventricular systolic function was not well visualized. The right  ventricular size is not well visualized.   3. The mitral valve is grossly normal. No evidence of mitral valve  regurgitation.   4. The aortic valve is normal in structure.  Aortic valve regurgitation is  not visualized. No aortic stenosis is present.   5. There is dilatation of the ascending aorta.   Patient Profile     72 y.o. male with a hx of NSTEMI in 2009 and cath with 99% distal LAD treated medically (Dr. Juanda Chance), recurrent metastatic non small cell lung cancer, adenocarcinoma on palliative chemo, + tobacco use, DM2, HTN, HLD, adrenal insuff.on chronic prednisone who was sent to ER from [facility] for failure to thrive and weakness and.is being seen 10/05/2022 for the evaluation of SVT, heart rate 190. He converted from SVT to SR after 150 mg amiodarone bolus, now on BB.  Assessment & Plan    PSVT - converted with amiodarone 150 mg bolus - hypokalemia and hypomagnesemia replaced - metoprolol 25 mg BID - echo pending, ordered this morning - would prefer to avoid long term amiodarone given lung cancer - Mg 1.7 this morning, will replace with 4 g IV, already started on PO replacement - agree - K 4.3 - telemetry with occasional PVCs and PACs, no further SVT   Recurrent metastatic non small cell adenocarcinoma - on palliative chemo, last treatment 09/24/22   FTT Adrenal insufficiency GERD Current smoker - per primary   I will arrange cardiology follow up. Additional cardiology workup pending echo results.      For questions or updates, please contact [facility] Please consult [email or web address] for contact info under        Signed, Marcelino Duster, PA  10/06/2022, 7:46 AM

## 2022-10-06 NOTE — Progress Notes (Signed)
  Echocardiogram 2D Echocardiogram has been performed.  Milda Smart 10/06/2022, 3:02 PM

## 2022-10-06 NOTE — Progress Notes (Signed)
OT Cancellation Note  Patient Details Name: Brandon Rubio. MRN: [id number] DOB: May 03, 1950   Cancelled Treatment:    Reason Eval/Treat Not Completed: Patient at procedure or test/ unavailable Patient in room with ultrasound and pending transfer to other floor. OT to continue to follow and check back as schedule will allow.  Rosalio Loud, MS Acute Rehabilitation Department Office# [phone]  10/06/2022, 2:26 PM

## 2022-10-07 DIAGNOSIS — R531 Weakness: Secondary | ICD-10-CM | POA: Diagnosis not present

## 2022-10-07 DIAGNOSIS — E2749 Other adrenocortical insufficiency: Secondary | ICD-10-CM

## 2022-10-07 DIAGNOSIS — E118 Type 2 diabetes mellitus with unspecified complications: Secondary | ICD-10-CM | POA: Diagnosis not present

## 2022-10-07 DIAGNOSIS — R627 Adult failure to thrive: Secondary | ICD-10-CM | POA: Diagnosis not present

## 2022-10-07 LAB — GLUCOSE, CAPILLARY
Glucose-Capillary: 131 mg/dL — ABNORMAL HIGH (ref 70–99)
Glucose-Capillary: 177 mg/dL — ABNORMAL HIGH (ref 70–99)

## 2022-10-07 MED ORDER — MAGNESIUM OXIDE -MG SUPPLEMENT 400 (240 MG) MG PO TABS
400.0000 mg | ORAL_TABLET | Freq: Two times a day (BID) | ORAL | Status: DC
  Administered 2022-10-07: 400 mg via ORAL
  Filled 2022-10-07: qty 1

## 2022-10-07 MED ORDER — METOPROLOL TARTRATE 25 MG PO TABS: 25.0000 mg | ORAL_TABLET | Freq: Two times a day (BID) | ORAL | 3 refills | Status: DC

## 2022-10-07 NOTE — Plan of Care (Signed)

## 2022-10-07 NOTE — Plan of Care (Signed)
  Problem: Metabolic: Goal: Ability to maintain appropriate glucose levels will improve Outcome: Progressing   

## 2022-10-07 NOTE — Care Management Important Message (Signed)
Important Message  Patient Details IM Letter given. Name: Brandon Rubio. MRN: [id number] Date of Birth: 12/18/1950   Medicare Important Message Given:  Yes     Caren Macadam 10/07/2022, 10:14 AM

## 2022-10-07 NOTE — Discharge Summary (Signed)
Physician Discharge Summary  Brandon Rubio. WGN:[id number] DOB: 1950-11-23 DOA: 10/02/2022  PCP: [name], MD  Admit date: 10/02/2022 Discharge date: 10/07/2022  Admitted From: Home Disposition:  Home  Recommendations for Outpatient Follow-up:  Follow up with PCP in 1-2 weeks Please obtain BMP/CBC in one week   Home Health:No Equipment/Devices:None  Discharge Condition:Stable CODE STATUS:Full Diet recommendation: Heart Healthy   Brief/Interim Summary:  72 y.o. 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  Except for his white blood cell count which was 2.9 and a hemoglobin of 12    Discharge Diagnoses:  Principal Problem:   Failure to thrive in adult Active Problems:   Diabetes mellitus type 2 with complications (HCC)   Hyperlipidemia associated with type 2 diabetes mellitus (HCC)   Essential hypertension   Adenocarcinoma of right lung, stage 3 (HCC)   Adrenal insufficiency (HCC)   Generalized weakness   Malnutrition of moderate degree   SVT (supraventricular tachycardia)  Failure to thrive in adult with a history of noncompliance steroids in the past: Of unclear etiology question adrenal insufficiency in the setting of chemotherapy.  Versus complaints. He was resuscitated put on stress dose steroids and creatinine improved. His appetite also improved. He will go home back on his home dose of steroids.  Hyponatremia: Question due to noncompliance with steroids he was given stress dose steroids and IV fluids and his sodium returned  to normal levels.  SVT: He was started on metoprolol cardiology was consulted his potassium was kept greater than 4 magnesium greater than 2. Blood pressures has remained stable. He will follow-up with cardiology as an outpatient. SVT is also been explanation for his generalized weakness and shortness of breath that he was having episodically  Hypomagnesemia: Mag was 1.7 it was kept above 2.  Hypokalemia: Replete orally now resolved.  High anion gap metabolic acidosis: In the setting of malignancy resolved with IV fluids.  Diabetes mellitus type 2 with complications: With a last A1c of 6.8 no changes made to his medication continue current regimen.  GERD: Continue PPI.  Hyperlipidemia: Continue statins.  Recurrent metastatic non-small cell carcinoma: On palliative chemotherapy last treatment on 09/24/2022 follow-up with oncology as an outpatient.  Tobacco disorder: He has been counseled.  Discharge Instructions  Discharge Instructions     Diet - low sodium heart healthy   Complete by: As directed    Increase activity slowly   Complete by: As directed       Allergies as of 10/07/2022       Reactions   Penicillins Anaphylaxis, Swelling, Other (See Comments)   THE THROAT SWELLS!!!! Because of a history of documented adverse serious drug reaction;[name] Alert bracelet  is recommended   Albumin (human) Hives, Itching        Medication List     TAKE these medications    aspirin EC 81 MG tablet Take 81 mg by mouth in the morning. Swallow whole.   bisacodyl 5 MG EC tablet Commonly known as: DULCOLAX Take 5 mg by mouth daily as needed for moderate constipation.   Centrum Silver 50+Men Tabs Take 1 tablet by mouth daily  with breakfast.   folic acid 1 MG tablet Commonly known as: FOLVITE TAKE 1 TABLET BY MOUTH EVERY DAY   HYDROcodone-acetaminophen 10-325 MG tablet Commonly known as: NORCO Take 1 tablet by mouth 2 (two) times daily as needed. What changed:  Another medication with the same name was changed. Make sure you understand how and when to take each.   HYDROcodone-acetaminophen 10-325 MG tablet Commonly known as: NORCO Take 1 tablet by mouth every 12 (twelve) hours as needed. What changed: when to take this   metFORMIN 1000 MG tablet Commonly known as: GLUCOPHAGE TAKE 1 TABLET(1000 MG) BY MOUTH TWICE DAILY WITH A MEAL What changed: See the new instructions.   metoprolol tartrate 25 MG tablet Commonly known as: LOPRESSOR Take 1 tablet (25 mg total) by mouth 2 (two) times daily.   omeprazole 20 MG capsule Commonly known as: PRILOSEC TAKE 1 CAPSULE BY MOUTH TWICE DAILY BEFORE A MEAL What changed: See the new instructions.   ondansetron 8 MG tablet Commonly known as: ZOFRAN Take 1 tablet (8 mg total) by mouth every 8 (eight) hours as needed for nausea or vomiting.   predniSONE 5 MG tablet Commonly known as: DELTASONE Take 1-1.5 tablets (5-7.5 mg total) by mouth daily with breakfast. What changed: how much to take   prochlorperazine 10 MG tablet Commonly known as: COMPAZINE Take 1 tablet (10 mg total) by mouth every 6 (six) hours as needed for nausea or vomiting.   simvastatin 40 MG tablet Commonly known as: ZOCOR TAKE 1 TABLET BY MOUTH EVERY EVENING AT 6 PM. What changed: See the new instructions.        Allergies  Allergen Reactions   Penicillins Anaphylaxis, Swelling and Other (See Comments)    THE THROAT SWELLS!!!! Because of a history of documented adverse serious drug reaction;[name] Alert bracelet  is recommended   Albumin (Human) Hives and Itching    Consultations: Cardiology   Procedures/Studies: ECHOCARDIOGRAM COMPLETE  Result Date: 10/06/2022    ECHOCARDIOGRAM REPORT   Patient Name:   Brandon Rubio. Date of Exam: 10/06/2022 Medical Rec #:  [id number]             Height:       72.0 in Accession #:    [id number]            Weight:       185.4 lb Date of Birth:  01/26/1951              BSA:           2.063 m Patient Age:    72 years              BP:           112/57 mmHg Patient Gender: M                     HR:           73 bpm. Exam Location:  Inpatient Procedure: 2D Echo, Cardiac Doppler, Color Doppler and Intracardiac            Opacification Agent Indications:    Abnormal ECG  History:        Patient has prior history of Echocardiogram examinations, most                 recent 05/11/2021. Previous Myocardial Infarction and CAD, DVT;                 Risk Factors:Hypertension, Dyslipidemia and Diabetes.  Lung CA.  Sonographer:    Milda Smart Referring Phys: [id number] ANGELA NICOLE DUKE  Sonographer Comments: Image acquisition challenging due to patient body habitus and Image acquisition challenging due to respiratory motion. IMPRESSIONS  1. Left ventricular ejection fraction, by estimation, is 60 to 65%. The left ventricle has normal function. The left ventricle has no regional wall motion abnormalities. Left ventricular diastolic parameters are indeterminate.  2. Right ventricular systolic function is normal. The right ventricular size is normal.  3. The mitral valve is normal in structure. No evidence of mitral valve regurgitation. No evidence of mitral stenosis.  4. The aortic valve is normal in structure. Aortic valve regurgitation is not visualized. No aortic stenosis is present.  5. There is mild dilatation of the ascending aorta, measuring 40 mm. There is borderline dilatation of the aortic root, measuring 38 mm.  6. The inferior vena cava is normal in size with greater than 50% respiratory variability, suggesting right atrial pressure of 3 mmHg. FINDINGS  Left Ventricle: Left ventricular ejection fraction, by estimation, is 60 to 65%. The left ventricle has normal function. The left ventricle has no regional wall motion abnormalities. The left ventricular internal cavity size was normal in size. There is  no left ventricular hypertrophy. Left ventricular diastolic parameters are indeterminate. Right  Ventricle: The right ventricular size is normal. No increase in right ventricular wall thickness. Right ventricular systolic function is normal. Left Atrium: Left atrial size was normal in size. Right Atrium: Right atrial size was normal in size. Pericardium: There is no evidence of pericardial effusion. Mitral Valve: The mitral valve is normal in structure. No evidence of mitral valve regurgitation. No evidence of mitral valve stenosis. MV peak gradient, 3.2 mmHg. The mean mitral valve gradient is 1.0 mmHg. Tricuspid Valve: The tricuspid valve is normal in structure. Tricuspid valve regurgitation is not demonstrated. No evidence of tricuspid stenosis. Aortic Valve: The aortic valve is normal in structure. Aortic valve regurgitation is not visualized. No aortic stenosis is present. Pulmonic Valve: The pulmonic valve was normal in structure. Pulmonic valve regurgitation is not visualized. No evidence of pulmonic stenosis. Aorta: The aortic root is normal in size and structure. There is mild dilatation of the ascending aorta, measuring 40 mm. There is borderline dilatation of the aortic root, measuring 38 mm. Venous: The inferior vena cava is normal in size with greater than 50% respiratory variability, suggesting right atrial pressure of 3 mmHg. IAS/Shunts: No atrial level shunt detected by color flow Doppler.  LEFT VENTRICLE PLAX 2D LVIDd:         4.50 cm     Diastology LVIDs:         3.40 cm     LV e' medial:    4.35 cm/s LV PW:         0.90 cm     LV E/e' medial:  9.7 LV IVS:        0.80 cm     LV e' lateral:   7.40 cm/s LVOT diam:     2.30 cm     LV E/e' lateral: 5.7 LV SV:         76 LV SV Index:   37 LVOT Area:     4.15 cm  LV Volumes (MOD) LV vol d, MOD A2C: 65.8 ml LV vol d, MOD A4C: 61.2 ml LV vol s, MOD A2C: 28.9 ml LV vol s, MOD A4C: 26.6 ml LV SV MOD A2C:     36.9 ml LV SV MOD  A4C:     61.2 ml LV SV MOD BP:      35.8 ml RIGHT VENTRICLE             IVC RV Basal diam:  3.50 cm     IVC diam: 2.00 cm RV S  prime:     10.00 cm/s TAPSE (M-mode): 1.7 cm LEFT ATRIUM             Index        RIGHT ATRIUM           Index LA diam:        3.50 cm 1.70 cm/m   RA Area:     15.40 cm LA Vol (A2C):   44.4 ml 21.52 ml/m  RA Volume:   37.90 ml  18.37 ml/m LA Vol (A4C):   46.5 ml 22.54 ml/m LA Biplane Vol: 45.9 ml 22.25 ml/m  AORTIC VALVE LVOT Vmax:   98.60 cm/s LVOT Vmean:  68.800 cm/s LVOT VTI:    0.184 m  AORTA Ao Root diam: 3.80 cm Ao Asc diam:  4.00 cm MITRAL VALVE               TRICUSPID VALVE MV Area (PHT): 1.87 cm    TR Peak grad:   10.8 mmHg MV Area VTI:   3.22 cm    TR Vmax:        164.00 cm/s MV Peak grad:  3.2 mmHg MV Mean grad:  1.0 mmHg    SHUNTS MV Vmax:       0.89 m/s    Systemic VTI:  0.18 m MV Vmean:      53.3 cm/s   Systemic Diam: 2.30 cm MV Decel Time: 405 msec MV E velocity: 42.20 cm/s MV A velocity: 62.70 cm/s MV E/A ratio:  0.67 Aditya Sabharwal Electronically signed by Dorthula Nettles Signature Date/Time: 10/06/2022/5:27:49 PM    Final    DG Chest 2 View  Result Date: 10/02/2022 CLINICAL DATA:  Weakness EXAM: CHEST - 2 VIEW COMPARISON:  CT chest 08/29/2022 and radiograph 05/24/2021 FINDINGS: Stable cardiomediastinal silhouette given patient rotation. Aortic atherosclerotic calcification. Right midlung scarring/atelectasis. Nodular opacities in the left mid and lower lung better assessed on recent CT chest 08/29/2022. No pleural effusion or pneumothorax. No displaced rib fractures. IMPRESSION: Nodular opacities in the left mid and lower lung are grossly similar to CT 08/29/2022. Electronically Signed   By: Minerva Fester M.D.   On: 10/02/2022 16:48   (Echo, Carotid, EGD, Colonoscopy, ERCP)    Subjective: No complaints feels great  Discharge Exam: Vitals:   10/06/22 2349 10/07/22 0502  BP: (!) 131/59 109/69  Pulse: 70 75  Resp: 19   Temp: 98 F (36.7 C) 98 F (36.7 C)  SpO2: 97% 96%   Vitals:   10/06/22 1519 10/06/22 1948 10/06/22 2349 10/07/22 0502  BP: 105/61 108/69 (!) 131/59  109/69  Pulse: 76 81 70 75  Resp: 16  19   Temp: 97.7 F (36.5 C) 98.2 F (36.8 C) 98 F (36.7 C) 98 F (36.7 C)  TempSrc: Oral Oral Oral Oral  SpO2: 96% 96% 97% 96%  Weight: 85.1 kg     Height: 6' (1.829 m)       General: Pt is alert, awake, not in acute distress Cardiovascular: RRR, S1/S2 +, no rubs, no gallops Respiratory: CTA bilaterally, no wheezing, no rhonchi Abdominal: Soft, NT, ND, bowel sounds + Extremities: no edema, no cyanosis    The results of significant diagnostics from  this hospitalization (including imaging, microbiology, ancillary and laboratory) are listed below for reference.     Microbiology: Recent Results (from the past 240 hour(s))  MRSA Next Gen by PCR, Nasal     Status: None   Collection Time: 10/05/22  2:40 AM   Specimen: Nasal Mucosa; Nasal Swab  Result Value Ref Range Status   MRSA by PCR Next Gen NOT DETECTED NOT DETECTED Final    Comment: (NOTE) The GeneXpert MRSA Assay (FDA approved for NASAL specimens only), is one component of a comprehensive MRSA colonization surveillance program. It is not intended to diagnose MRSA infection nor to guide or monitor treatment for MRSA infections. Test performance is not FDA approved in patients less than 31 years old. Performed at [facility], [address], [place], [place] [address]      Labs: BNP (last 3 results) Recent Labs    10/02/22 1640  BNP 37.6   Basic Metabolic Panel: Recent Labs  Lab 10/03/22 0452 10/04/22 0350 10/05/22 0235 10/05/22 1750 10/06/22 0251  NA 132* 138 137 139 138  K 3.7 3.5 3.2* 4.1 4.3  CL 99 106 105 106 105  CO2 21* 24 21* 26 26  GLUCOSE 122* 159* 215* 148* 157*  BUN 20 20 24* 21 19  CREATININE 0.90 0.78 1.06 0.78 0.74  CALCIUM 8.9 8.7* 8.0* 8.4* 8.5*  MG 2.1 1.7 1.4* 2.0 1.7  PHOS 3.1 1.7* 3.0  --   --    Liver Function Tests: Recent Labs  Lab 10/02/22 1255  AST 29  ALT 22  ALKPHOS 62  BILITOT 0.5  PROT 7.7  ALBUMIN 4.1    No results for input(s): "LIPASE", "AMYLASE" in the last 168 hours. No results for input(s): "AMMONIA" in the last 168 hours. CBC: Recent Labs  Lab 10/02/22 1255 10/03/22 0452  WBC 4.4 2.9*  NEUTROABS 2.3  --   HGB 12.0* 10.1*  HCT 33.8* 29.7*  MCV 93.6 95.5  PLT 150 150   Cardiac Enzymes: No results for input(s): "CKTOTAL", "CKMB", "CKMBINDEX", "TROPONINI" in the last 168 hours. BNP: Invalid input(s): "POCBNP" CBG: Recent Labs  Lab 10/06/22 0736 10/06/22 1148 10/06/22 1659 10/06/22 2107 10/07/22 0729  GLUCAP 118* 185* 196* 180* 131*   D-Dimer No results for input(s): "DDIMER" in the last 72 hours. Hgb A1c No results for input(s): "HGBA1C" in the last 72 hours. Lipid Profile No results for input(s): "CHOL", "HDL", "LDLCALC", "TRIG", "CHOLHDL", "LDLDIRECT" in the last 72 hours. Thyroid function studies No results for input(s): "TSH", "T4TOTAL", "T3FREE", "THYROIDAB" in the last 72 hours.  Invalid input(s): "FREET3" Anemia work up No results for input(s): "VITAMINB12", "FOLATE", "FERRITIN", "TIBC", "IRON", "RETICCTPCT" in the last 72 hours. Urinalysis    Component Value Date/Time   COLORURINE YELLOW 10/02/2022 1716   APPEARANCEUR CLEAR 10/02/2022 1716   LABSPEC 1.019 10/02/2022 1716   PHURINE 5.0 10/02/2022 1716   GLUCOSEU NEGATIVE 10/02/2022 1716   HGBUR NEGATIVE 10/02/2022 1716   BILIRUBINUR NEGATIVE 10/02/2022 1716   KETONESUR 80 (A) 10/02/2022 1716   PROTEINUR 30 (A) 10/02/2022 1716   UROBILINOGEN 0.2 12/11/2007 2313   NITRITE NEGATIVE 10/02/2022 1716   LEUKOCYTESUR NEGATIVE 10/02/2022 1716   Sepsis Labs Recent Labs  Lab 10/02/22 1255 10/03/22 0452  WBC 4.4 2.9*   Microbiology Recent Results (from the past 240 hour(s))  MRSA Next Gen by PCR, Nasal     Status: None   Collection Time: 10/05/22  2:40 AM   Specimen: Nasal Mucosa; Nasal Swab  Result  Value Ref Range Status   MRSA by PCR Next Gen NOT DETECTED NOT DETECTED Final    Comment:  (NOTE) The GeneXpert MRSA Assay (FDA approved for NASAL specimens only), is one component of a comprehensive MRSA colonization surveillance program. It is not intended to diagnose MRSA infection nor to guide or monitor treatment for MRSA infections. Test performance is not FDA approved in patients less than 66 years old. Performed at [facility], [address], [place], [place] [address]     SIGNED:   Marinda Elk, MD  [facility] 10/07/2022, 9:48 AM Pager   If 7PM-7AM, please contact night-coverage [email or web address] Password [email or web address]

## 2022-10-08 ENCOUNTER — Encounter: Payer: Self-pay | Admitting: *Deleted

## 2022-10-08 ENCOUNTER — Telehealth: Payer: Self-pay | Admitting: *Deleted

## 2022-10-08 ENCOUNTER — Ambulatory Visit: Payer: Self-pay

## 2022-10-08 NOTE — Transitions of Care (Post Inpatient/ED Visit) (Signed)
10/08/2022  Name: Brandon Rubio. MRN: [id number] DOB: 06/18/1950  Today's TOC FU Call Status: Today's TOC FU Call Status:: Successful TOC FU Call Competed TOC FU Call Complete Date: 10/08/22  Transition Care Management Follow-up Telephone Call Date of Discharge: 10/07/22 Discharge Facility: [facility]) Type of Discharge: Inpatient Admission Primary Inpatient Discharge Diagnosis:: weakness, FTT in setting of lung CA; SVT How have you been since you were released from the hospital?: Better ("I am much better, back to my normal self, not having any problems so far") Any questions or concerns?: Yes Patient Questions/Concerns:: has not yet been notified from outpatient pharmacy that newly prescribed Rx (metoprolol) is ready for pick up Patient Questions/Concerns Addressed: Other: (advised to contact outpatient pharmacy to confirm receipt of new Rx; provided my direct phone number should he have subsequent questions/ concerns around this new Rx)  Items Reviewed: Did you receive and understand the discharge instructions provided?: Yes (thoroughly reviewed with patient who verbalizes good understanding of same) Medications obtained,verified, and reconciled?: Yes (Medications Reviewed) (Full medication reconciliation/ review completed; no concerns or discrepancies identified; confirmed patient obtained/ is taking all newly Rx'd medications as instructed; self-manages medications and denies questions/ concerns around medications today) Any new allergies since your discharge?: No Dietary orders reviewed?: Yes Type of Diet Ordered:: "as healthy as possible, my appetite is not very good" Do you have support at home?: Yes People in Home: alone Name of Support/Comfort Primary Source: Reports resides alone; independent in self-care activities; supportive family/ friends assists as/ if needed/ indicated  Medications Reviewed Today: Medications Reviewed Today     Reviewed by Michaela Corner,  RN (Registered Nurse) on 10/08/22 at 1144  Med List Status: <None>   Medication Order Taking? Sig Documenting Provider Last Dose Status Informant  aspirin EC 81 MG tablet [id number] Yes Take 81 mg by mouth in the morning. Swallow whole. [provider] Taking Active Self  bisacodyl (DULCOLAX) 5 MG EC tablet [id number] Yes Take 5 mg by mouth daily as needed for moderate constipation. [provider] Taking Active Self  folic acid (FOLVITE) 1 MG tablet [id number] Yes TAKE 1 TABLET BY MOUTH EVERY DAY [name], MD Taking Active Self  HYDROcodone-acetaminophen [facility]) 10-325 MG tablet [id number] Yes Take 1 tablet by mouth 2 (two) times daily as needed. [name], MD Taking Active Self  HYDROcodone-acetaminophen [facility]) 10-325 MG tablet [id number] Yes Take 1 tablet by mouth every 12 (twelve) hours as needed.  Patient taking differently: Take 1 tablet by mouth every 6 (six) hours.   Guy Sandifer L, PA Taking Active Self  metFORMIN (GLUCOPHAGE) 1000 MG tablet [id number] Yes TAKE 1 TABLET(1000 MG) BY MOUTH TWICE DAILY WITH A MEAL  Patient taking differently: Take 1,000 mg by mouth 2 (two) times daily with a meal.   [name], MD Taking Active Self  metoprolol tartrate (LOPRESSOR) 25 MG tablet [id number] Yes Take 1 tablet (25 mg total) by mouth 2 (two) times daily. Marinda Elk, MD Taking Active            Med Note Otelia Limes Oct 08, 2022 11:22 AM) 10/08/22: Reports during TOC call today, he has not yet been notified by outpatient pharmacy that this medication is ready for pick up- confirmed he has phone number for pharmacy- he will call after TOC call to confirm receipt of Rx; verbalizes good understanding of purpose/ scheduling of new medication  Multiple Vitamins-Minerals (CENTRUM SILVER 50+MEN) TABS [id number] Yes Take 1  tablet by mouth daily with breakfast. [provider] Taking Active Self  omeprazole (PRILOSEC) 20 MG capsule  [id number] Yes TAKE 1 CAPSULE BY MOUTH TWICE DAILY BEFORE A MEAL  Patient taking differently: Take 20 mg by mouth 2 (two) times daily before a meal.   [name], MD Taking Active Self  ondansetron (ZOFRAN) 8 MG tablet [id number] Yes Take 1 tablet (8 mg total) by mouth every 8 (eight) hours as needed for nausea or vomiting. Heilingoetter, [name], PA-C Taking Active Self  predniSONE (DELTASONE) 5 MG tablet [id number] Yes Take 1-1.5 tablets (5-7.5 mg total) by mouth daily with breakfast.  Patient taking differently: Take 7.5 mg by mouth daily with breakfast.   [name], MD Taking Active Self  prochlorperazine (COMPAZINE) 10 MG tablet [id number] Yes Take 1 tablet (10 mg total) by mouth every 6 (six) hours as needed for nausea or vomiting. [name], MD Taking Active Self           Med Note Otelia Limes Oct 08, 2022 11:23 AM) 10/08/22: Reports during TOC call has medication, but has not needed/ taken recently  simvastatin (ZOCOR) 40 MG tablet [id number] Yes TAKE 1 TABLET BY MOUTH EVERY EVENING AT 6 PM.  Patient taking differently: Take 40 mg by mouth in the morning.   [name], MD Taking Active Self            Home Care and Equipment/Supplies: Were Home Health Services Ordered?: No Any new equipment or medical supplies ordered?: No  Functional Questionnaire: Do you need assistance with bathing/showering or dressing?: No Do you need assistance with meal preparation?: No Do you need assistance with eating?: No Do you have difficulty maintaining continence: No Do you need assistance with getting out of bed/getting out of a chair/moving?: No Do you have difficulty managing or taking your medications?: No  Follow up appointments reviewed: PCP Follow-up appointment confirmed?: Yes Date of PCP follow-up appointment?: 10/16/22 Follow-up Provider: PCP Specialist Hospital Follow-up appointment confirmed?: Yes Date of Specialist follow-up  appointment?: 10/13/22 Follow-Up Specialty Provider:: oncology provider on 10/13/22; cardiology provider on 10/24/22 Do you need transportation to your follow-up appointment?: No Do you understand care options if your condition(s) worsen?: Yes-patient verbalized understanding  SDOH Interventions Today    Flowsheet Row Most Recent Value  SDOH Interventions   Food Insecurity Interventions Intervention Not Indicated  Transportation Interventions Intervention Not Indicated  [drives self]      TOC Interventions Today    Flowsheet Row Most Recent Value  TOC Interventions   TOC Interventions Discussed/Reviewed TOC Interventions Discussed, Arranged PCP follow up within 7 days/Care Guide scheduled  [Patient declines need for ongoing/ further care coordination outreach,  no care coordination needs identified at time of TOC call today,  provided my direct contact information should questions/ concerns/ needs arise post-TOC call]      Interventions Today    Flowsheet Row Most Recent Value  Chronic Disease   Chronic disease during today's visit Other  [weakness/ FTT/ SVT]  General Interventions   General Interventions Discussed/Reviewed General Interventions Discussed, Durable Medical Equipment (DME), Doctor Visits  Doctor Visits Discussed/Reviewed Doctor Visits Discussed, PCP, Specialist  Durable Medical Equipment (DME) Other  [confirmed not currently requiring/ using assistive devices]  PCP/Specialist Visits Compliance with follow-up visit  Education Interventions   Education Provided Provided Education  Provided Verbal Education On Medication  [purpose of newly prescribed metoprolol,  reinforced scheduling and dosing of new medication]  Nutrition Interventions  Nutrition Discussed/Reviewed Nutrition Discussed  Pharmacy Interventions   Pharmacy Dicussed/Reviewed Pharmacy Topics Discussed  [Full medication review with updating medication list in EHR per patient report]      Caryl Pina, RN, BSN, CCRN Alumnus RN CM Care Coordination/ Transition of Care- [facility] Care Management [phone]: direct office

## 2022-10-08 NOTE — Chronic Care Management (AMB) (Signed)
   10/08/2022  Brandon Rubio. [birth year] [id number]   Reason for Encounter: Patient is not currently enrolled in the CCM program. CCM enrollment status changed to Previously enrolled.   [facility]/Chronic Care Management [phone]

## 2022-10-10 ENCOUNTER — Other Ambulatory Visit: Payer: Self-pay | Admitting: Internal Medicine

## 2022-10-10 DIAGNOSIS — T451X5A Adverse effect of antineoplastic and immunosuppressive drugs, initial encounter: Secondary | ICD-10-CM

## 2022-10-13 ENCOUNTER — Encounter: Payer: Self-pay | Admitting: Internal Medicine

## 2022-10-13 ENCOUNTER — Inpatient Hospital Stay: Payer: Medicare Other

## 2022-10-13 ENCOUNTER — Inpatient Hospital Stay (HOSPITAL_BASED_OUTPATIENT_CLINIC_OR_DEPARTMENT_OTHER): Payer: Medicare Other | Admitting: Internal Medicine

## 2022-10-13 ENCOUNTER — Inpatient Hospital Stay: Payer: Medicare Other | Attending: Internal Medicine

## 2022-10-13 ENCOUNTER — Other Ambulatory Visit: Payer: Self-pay

## 2022-10-13 VITALS — BP 107/61 | HR 78 | Temp 98.6°F | Resp 14 | Ht 72.0 in | Wt 184.0 lb

## 2022-10-13 DIAGNOSIS — C3490 Malignant neoplasm of unspecified part of unspecified bronchus or lung: Secondary | ICD-10-CM

## 2022-10-13 DIAGNOSIS — Z923 Personal history of irradiation: Secondary | ICD-10-CM | POA: Diagnosis not present

## 2022-10-13 DIAGNOSIS — C3491 Malignant neoplasm of unspecified part of right bronchus or lung: Secondary | ICD-10-CM

## 2022-10-13 DIAGNOSIS — C3431 Malignant neoplasm of lower lobe, right bronchus or lung: Secondary | ICD-10-CM | POA: Insufficient documentation

## 2022-10-13 DIAGNOSIS — Z5111 Encounter for antineoplastic chemotherapy: Secondary | ICD-10-CM | POA: Diagnosis not present

## 2022-10-13 DIAGNOSIS — Z79899 Other long term (current) drug therapy: Secondary | ICD-10-CM | POA: Diagnosis not present

## 2022-10-13 DIAGNOSIS — Z5112 Encounter for antineoplastic immunotherapy: Secondary | ICD-10-CM | POA: Insufficient documentation

## 2022-10-13 LAB — CBC WITH DIFFERENTIAL (CANCER CENTER ONLY)
Abs Immature Granulocytes: 0.04 10*3/uL (ref 0.00–0.07)
Basophils Absolute: 0.1 10*3/uL (ref 0.0–0.1)
Basophils Relative: 1 %
Eosinophils Absolute: 0.1 10*3/uL (ref 0.0–0.5)
Eosinophils Relative: 1 %
HCT: 29.7 % — ABNORMAL LOW (ref 39.0–52.0)
Hemoglobin: 10.5 g/dL — ABNORMAL LOW (ref 13.0–17.0)
Immature Granulocytes: 0 %
Lymphocytes Relative: 10 %
Lymphs Abs: 1 10*3/uL (ref 0.7–4.0)
MCH: 33.7 pg (ref 26.0–34.0)
MCHC: 35.4 g/dL (ref 30.0–36.0)
MCV: 95.2 fL (ref 80.0–100.0)
Monocytes Absolute: 0.6 10*3/uL (ref 0.1–1.0)
Monocytes Relative: 6 %
Neutro Abs: 8.2 10*3/uL — ABNORMAL HIGH (ref 1.7–7.7)
Neutrophils Relative %: 82 %
Platelet Count: 200 10*3/uL (ref 150–400)
RBC: 3.12 MIL/uL — ABNORMAL LOW (ref 4.22–5.81)
RDW: 13.5 % (ref 11.5–15.5)
WBC Count: 10 10*3/uL (ref 4.0–10.5)
nRBC: 0 % (ref 0.0–0.2)

## 2022-10-13 LAB — CMP (CANCER CENTER ONLY)
ALT: 26 U/L (ref 0–44)
AST: 24 U/L (ref 15–41)
Albumin: 3.8 g/dL (ref 3.5–5.0)
Alkaline Phosphatase: 56 U/L (ref 38–126)
Anion gap: 10 (ref 5–15)
BUN: 13 mg/dL (ref 8–23)
CO2: 24 mmol/L (ref 22–32)
Calcium: 8.6 mg/dL — ABNORMAL LOW (ref 8.9–10.3)
Chloride: 104 mmol/L (ref 98–111)
Creatinine: 1.05 mg/dL (ref 0.61–1.24)
GFR, Estimated: >60 mL/min (ref >60)
Glucose, Bld: 141 mg/dL — ABNORMAL HIGH (ref 70–99)
Potassium: 3.8 mmol/L (ref 3.5–5.1)
Sodium: 138 mmol/L (ref 135–145)
Total Bilirubin: 0.3 mg/dL (ref 0.3–1.2)
Total Protein: 7 g/dL (ref 6.5–8.1)

## 2022-10-13 MED ORDER — SODIUM CHLORIDE 0.9 % IV SOLN: Freq: Once | INTRAVENOUS | Status: AC

## 2022-10-13 MED ORDER — PALONOSETRON HCL INJECTION 0.25 MG/5ML
0.2500 mg | Freq: Once | INTRAVENOUS | Status: AC
  Administered 2022-10-13: 0.25 mg via INTRAVENOUS
  Filled 2022-10-13: qty 5

## 2022-10-13 MED ORDER — SODIUM CHLORIDE 0.9 % IV SOLN
400.0000 mg/m2 | Freq: Once | INTRAVENOUS | Status: AC
  Administered 2022-10-13: 900 mg via INTRAVENOUS
  Filled 2022-10-13: qty 20

## 2022-10-13 MED ORDER — SODIUM CHLORIDE 0.9 % IV SOLN
200.0000 mg | Freq: Once | INTRAVENOUS | Status: AC
  Administered 2022-10-13: 200 mg via INTRAVENOUS
  Filled 2022-10-13: qty 200

## 2022-10-13 NOTE — Progress Notes (Signed)
[facility] [phone]   [phone]  OFFICE PROGRESS NOTE  [name], MD [address] [place] [address]  DIAGNOSIS: Stage IIIA (T4, N0, M0) non-small cell lung cancer, favoring adenocarcinoma presented with large right lower lobe lung mass with no mediastinal lymphadenopathy or extrathoracic metastasis diagnosed in October 2022. Had disease progression with enlarging mass and new right hilar and mediastinal lymph nodes in December 2022.      PRIOR THERAPY:  1) Neoadjuvant treatment with carboplatin for AUC of 5, Alimta 500 Mg/M2 and nivolumab 360 Mg IV every 3 weeks.  First dose January 23, 2021.  Status post 3 cycles. 2) Concurrent chemoradiation with carboplatin for an AUC of 2 and paclitaxel 45 mg per metered squared.  First dose expected on 04/01/2021.  Status post 4 cycles 3) Consolidation treatment with immunotherapy with Imfinzi 1500 Mg IV every 4 weeks.  First dose on Jul 08, 2021.  Status post 9 cycles.    CURRENT THERAPY: Palliative systemic chemotherapy with carboplatin for an AUC of 5, Alimta 500 mg/m, Keytruda 200 mg IV every 3 weeks. First dose on 05/13/2022.  Status post 7 cycles.  Starting him cycle #3 his dose of carboplatin will be reduced to AUC of 4 and Alimta 400 Mg/M2 secondary to intolerance.  INTERVAL HISTORY: Brandon Rubio. 72 y.o. male returns to the clinic today for follow-up visit.  The patient is feeling fine today with no concerning complaints except for fatigue.  He was seen at the emergency department and admitted to the hospital a week ago with significant weakness and fatigue as well as failure to thrive and hyponatremia.  He also had SVT during his admission and started treatment on metoprolol by cardiology.  He has a follow-up with his cardiologist next week.  He is feeling much better today with no significant chest pain but has shortness of breath with exertion with no cough or hemoptysis.   He has no nausea, vomiting, diarrhea or constipation.  He has no headache or visual changes.  He is here today for evaluation before starting cycle #8 of his treatment.   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  MEDICATIONS:  Current Outpatient Medications  Medication Sig Dispense Refill   aspirin EC 81 MG tablet Take 81 mg by mouth in the morning. Swallow whole.     bisacodyl (DULCOLAX) 5 MG EC tablet Take 5 mg by mouth daily as needed for moderate constipation.     folic acid (FOLVITE) 1 MG tablet TAKE 1 TABLET BY MOUTH EVERY DAY 30 tablet 2   HYDROcodone-acetaminophen (NORCO) 10-325 MG tablet Take 1 tablet by mouth 2 (two) times daily as needed. 60 tablet 0   HYDROcodone-acetaminophen (NORCO) 10-325 MG tablet Take 1 tablet by mouth every 12 (twelve) hours as needed. (Patient taking differently: Take 1 tablet by mouth every 6 (six) hours.) 60 tablet  0   metFORMIN (GLUCOPHAGE) 1000 MG tablet TAKE 1 TABLET(1000 MG) BY MOUTH TWICE DAILY WITH A MEAL (Patient taking differently: Take 1,000 mg by mouth 2 (two) times daily with a meal.) 180 tablet 1   metoprolol tartrate (LOPRESSOR) 25 MG tablet Take 1 tablet (25 mg total) by mouth 2 (two) times daily. 60 tablet 3   Multiple Vitamins-Minerals (CENTRUM SILVER 50+MEN) TABS Take 1 tablet by mouth daily with breakfast.     omeprazole (PRILOSEC) 20 MG capsule TAKE 1 CAPSULE BY MOUTH TWICE DAILY BEFORE A MEAL (Patient taking differently: Take 20 mg by mouth 2 (two)  times daily before a meal.) 180 capsule 1   ondansetron (ZOFRAN) 8 MG tablet Take 1 tablet (8 mg total) by mouth every 8 (eight) hours as needed for nausea or vomiting. 30 tablet 2   predniSONE (DELTASONE) 5 MG tablet Take 1-1.5 tablets (5-7.5 mg total) by mouth daily with breakfast. (Patient taking differently: Take 7.5 mg by mouth daily with breakfast.) 135 tablet 3   prochlorperazine (COMPAZINE) 10 MG tablet TAKE 1 TABLET(10 MG) BY MOUTH EVERY 6 HOURS AS NEEDED FOR NAUSEA OR VOMITING 60 tablet 1   simvastatin (ZOCOR) 40 MG tablet TAKE 1 TABLET BY MOUTH EVERY EVENING AT 6 PM. (Patient taking differently: Take 40 mg by mouth in the morning.) 90 tablet 3   No current facility-administered medications for this visit.    SURGICAL HISTORY:  Past Surgical History:  Procedure Laterality Date   BRONCHIAL BIOPSY  12/10/2020   Procedure: BRONCHIAL BIOPSIES;  Surgeon: Leslye Peer, MD;  Location[facility] ENDOSCOPY;  Service: Pulmonary;;   BRONCHIAL BRUSHINGS  12/10/2020   Procedure: BRONCHIAL BRUSHINGS;  Surgeon: Leslye Peer, MD;  Location: [facility] ENDOSCOPY;  Service: Pulmonary;;   BRONCHIAL NEEDLE ASPIRATION BIOPSY  12/10/2020   Procedure: BRONCHIAL NEEDLE ASPIRATION BIOPSIES;  Surgeon: Leslye Peer, MD;  Location: [facility] ENDOSCOPY;  Service: Pulmonary;;   COLONOSCOPY     COLONOSCOPY W/ POLYPECTOMY  03/10/2009   LARYNGOSCOPY  03/10/1997   VIDEO BRONCHOSCOPY WITH ENDOBRONCHIAL NAVIGATION N/A 12/10/2020   Procedure: ROBOTIC VIDEO BRONCHOSCOPY WITH ENDOBRONCHIAL NAVIGATION;  Surgeon: Leslye Peer, MD;  Location: [facility] ENDOSCOPY;  Service: Pulmonary;  Laterality: N/A;    REVIEW OF SYSTEMS:  A comprehensive review of systems was negative except for: Constitutional: positive for fatigue   PHYSICAL EXAMINATION: General appearance: alert, cooperative, and no distress Head: Normocephalic, without obvious abnormality, atraumatic Neck: no adenopathy, no JVD, supple, symmetrical, trachea midline, and thyroid  not enlarged, symmetric, no tenderness/mass/nodules Lymph nodes: Cervical, supraclavicular, and axillary nodes normal. Resp: clear to auscultation bilaterally Back: symmetric, no curvature. ROM normal. No CVA tenderness. Cardio: regular rate and rhythm, S1, S2 normal, no murmur, click, rub or gallop GI: soft, non-tender; bowel sounds normal; no masses,  no organomegaly Extremities: extremities normal, atraumatic, no cyanosis or edema  ECOG PERFORMANCE STATUS: 1 - Symptomatic but completely ambulatory  Blood pressure 107/61, pulse 78, temperature 98.6 F (37 C), temperature source Oral, resp. rate 14, height 6' (1.829 m), weight 184 lb (83.5 kg), SpO2 95%.  LABORATORY DATA: Lab Results  Component Value Date   WBC 10.0 10/13/2022   HGB 10.5 (L) 10/13/2022   HCT 29.7 (L) 10/13/2022   MCV 95.2 10/13/2022   PLT 200 10/13/2022      Chemistry      Component Value Date/Time   NA 138 10/06/2022 0251   K 4.3 10/06/2022 0251   CL 105 10/06/2022 0251   CO2 26 10/06/2022 0251  BUN 19 10/06/2022 0251   CREATININE 0.74 10/06/2022 0251   CREATININE 1.19 10/02/2022 1255      Component Value Date/Time   CALCIUM 8.5 (L) 10/06/2022 0251   ALKPHOS 62 10/02/2022 1255   AST 29 10/02/2022 1255   ALT 22 10/02/2022 1255   BILITOT 0.5 10/02/2022 1255       RADIOGRAPHIC STUDIES: ECHOCARDIOGRAM COMPLETE  Result Date: 10/06/2022    ECHOCARDIOGRAM REPORT   Patient Name:   Brandon Rubio. Date of Exam: 10/06/2022 Medical Rec #:  [id number]             Height:       72.0 in Accession #:    [id number]            Weight:       185.4 lb Date of Birth:  1950-08-22              BSA:          2.063 m Patient Age:    72 years              BP:           112/57 mmHg Patient Gender: M                     HR:           73 bpm. Exam Location:  Inpatient Procedure: 2D Echo, Cardiac Doppler, Color Doppler and Intracardiac            Opacification Agent Indications:    Abnormal ECG  History:        Patient has  prior history of Echocardiogram examinations, most                 recent 05/11/2021. Previous Myocardial Infarction and CAD, DVT;                 Risk Factors:Hypertension, Dyslipidemia and Diabetes. Lung CA.  Sonographer:    Milda Smart Referring Phys: [id number] ANGELA NICOLE DUKE  Sonographer Comments: Image acquisition challenging due to patient body habitus and Image acquisition challenging due to respiratory motion. IMPRESSIONS  1. Left ventricular ejection fraction, by estimation, is 60 to 65%. The left ventricle has normal function. The left ventricle has no regional wall motion abnormalities. Left ventricular diastolic parameters are indeterminate.  2. Right ventricular systolic function is normal. The right ventricular size is normal.  3. The mitral valve is normal in structure. No evidence of mitral valve regurgitation. No evidence of mitral stenosis.  4. The aortic valve is normal in structure. Aortic valve regurgitation is not visualized. No aortic stenosis is present.  5. There is mild dilatation of the ascending aorta, measuring 40 mm. There is borderline dilatation of the aortic root, measuring 38 mm.  6. The inferior vena cava is normal in size with greater than 50% respiratory variability, suggesting right atrial pressure of 3 mmHg. FINDINGS  Left Ventricle: Left ventricular ejection fraction, by estimation, is 60 to 65%. The left ventricle has normal function. The left ventricle has no regional wall motion abnormalities. The left ventricular internal cavity size was normal in size. There is  no left ventricular hypertrophy. Left ventricular diastolic parameters are indeterminate. Right Ventricle: The right ventricular size is normal. No increase in right ventricular wall thickness. Right ventricular systolic function is normal. Left Atrium: Left atrial size was normal in size. Right Atrium: Right atrial size was normal in size. Pericardium: There is no evidence of pericardial  effusion. Mitral  Valve: The mitral valve is normal in structure. No evidence of mitral valve regurgitation. No evidence of mitral valve stenosis. MV peak gradient, 3.2 mmHg. The mean mitral valve gradient is 1.0 mmHg. Tricuspid Valve: The tricuspid valve is normal in structure. Tricuspid valve regurgitation is not demonstrated. No evidence of tricuspid stenosis. Aortic Valve: The aortic valve is normal in structure. Aortic valve regurgitation is not visualized. No aortic stenosis is present. Pulmonic Valve: The pulmonic valve was normal in structure. Pulmonic valve regurgitation is not visualized. No evidence of pulmonic stenosis. Aorta: The aortic root is normal in size and structure. There is mild dilatation of the ascending aorta, measuring 40 mm. There is borderline dilatation of the aortic root, measuring 38 mm. Venous: The inferior vena cava is normal in size with greater than 50% respiratory variability, suggesting right atrial pressure of 3 mmHg. IAS/Shunts: No atrial level shunt detected by color flow Doppler.  LEFT VENTRICLE PLAX 2D LVIDd:         4.50 cm     Diastology LVIDs:         3.40 cm     LV e' medial:    4.35 cm/s LV PW:         0.90 cm     LV E/e' medial:  9.7 LV IVS:        0.80 cm     LV e' lateral:   7.40 cm/s LVOT diam:     2.30 cm     LV E/e' lateral: 5.7 LV SV:         76 LV SV Index:   37 LVOT Area:     4.15 cm  LV Volumes (MOD) LV vol d, MOD A2C: 65.8 ml LV vol d, MOD A4C: 61.2 ml LV vol s, MOD A2C: 28.9 ml LV vol s, MOD A4C: 26.6 ml LV SV MOD A2C:     36.9 ml LV SV MOD A4C:     61.2 ml LV SV MOD BP:      35.8 ml RIGHT VENTRICLE             IVC RV Basal diam:  3.50 cm     IVC diam: 2.00 cm RV S prime:     10.00 cm/s TAPSE (M-mode): 1.7 cm LEFT ATRIUM             Index        RIGHT ATRIUM           Index LA diam:        3.50 cm 1.70 cm/m   RA Area:     15.40 cm LA Vol (A2C):   44.4 ml 21.52 ml/m  RA Volume:   37.90 ml  18.37 ml/m LA Vol (A4C):   46.5 ml 22.54 ml/m LA Biplane Vol: 45.9 ml 22.25 ml/m   AORTIC VALVE LVOT Vmax:   98.60 cm/s LVOT Vmean:  68.800 cm/s LVOT VTI:    0.184 m  AORTA Ao Root diam: 3.80 cm Ao Asc diam:  4.00 cm MITRAL VALVE               TRICUSPID VALVE MV Area (PHT): 1.87 cm    TR Peak grad:   10.8 mmHg MV Area VTI:   3.22 cm    TR Vmax:        164.00 cm/s MV Peak grad:  3.2 mmHg MV Mean grad:  1.0 mmHg    SHUNTS MV Vmax:       0.89  m/s    Systemic VTI:  0.18 m MV Vmean:      53.3 cm/s   Systemic Diam: 2.30 cm MV Decel Time: 405 msec MV E velocity: 42.20 cm/s MV A velocity: 62.70 cm/s MV E/A ratio:  0.67 Aditya Sabharwal Electronically signed by Dorthula Nettles Signature Date/Time: 10/06/2022/5:27:49 PM    Final    DG Chest 2 View  Result Date: 10/02/2022 CLINICAL DATA:  Weakness EXAM: CHEST - 2 VIEW COMPARISON:  CT chest 08/29/2022 and radiograph 05/24/2021 FINDINGS: Stable cardiomediastinal silhouette given patient rotation. Aortic atherosclerotic calcification. Right midlung scarring/atelectasis. Nodular opacities in the left mid and lower lung better assessed on recent CT chest 08/29/2022. No pleural effusion or pneumothorax. No displaced rib fractures. IMPRESSION: Nodular opacities in the left mid and lower lung are grossly similar to CT 08/29/2022. Electronically Signed   By: Minerva Fester M.D.   On: 10/02/2022 16:48    ASSESSMENT AND PLAN: This is a very pleasant 72 years old white male with history of a stage IIIA (T4, N1, M0) non-small cell lung cancer, adenocarcinoma presented with right lower lobe lung mass with a right hilar and no mediastinal lymphadenopathy and no evidence of extrathoracic disease.  The patient was diagnosed in October 2022 status post 3 cycles of neoadjuvant systemic chemotherapy with carboplatin, Alimta and nivolumab but unfortunately has no evidence for significant improvement of his disease and actually has some evidence for progression. The patient underwent a course of concurrent chemoradiation weekly carboplatin for AUC of 2 and paclitaxel 45  Mg/M2.  Status post 4 cycles. The patient had a rough time with the course of concurrent chemoradiation and he was hospitalized several times during this course. Imaging studies after the course of concurrent chemoradiation showed no concerning findings for disease progression. The patient underwent treatment with immunotherapy with Imfinzi 1500 Mg IV every 4 weeks status post 9 cycles.  This treatment was discontinued secondary to disease progression. The patient is currently on systemic chemotherapy with carboplatin, Alimta and Keytruda status post 7 cycles.  Starting from cycle #5 he has been on treatment with maintenance Alimta and Keytruda every 3 weeks. The patient tolerated the last cycle of his treatment much better after discontinuation of the carboplatin. Because of his recent admission to the hospital I discussed with the patient delaying his treatment by 1 or 2 weeks until improvement of his condition and seeing his cardiologist but the patient would like to proceed with treatment today as planned. Will continue to monitor him closely and I encouraged him to increase his p.o. intake and to keep taking his prednisone 7.5 mg p.o. daily as recommended by his endocrinologist. He will come back for follow-up visit in 3 weeks for evaluation and repeat CT scan of the chest, abdomen and pelvis for restaging of his disease. The patient voices understanding of current disease status and treatment options and is in agreement with the current care plan.  All questions were answered. The patient knows to call the clinic with any problems, questions or concerns. We can certainly see the patient much sooner if necessary.   Disclaimer: This note was dictated with voice recognition software. Similar sounding words can inadvertently be transcribed and may not be corrected upon review.

## 2022-10-16 ENCOUNTER — Inpatient Hospital Stay: Payer: Medicare Other | Admitting: Internal Medicine

## 2022-10-24 ENCOUNTER — Encounter: Payer: Self-pay | Admitting: Physician Assistant

## 2022-10-24 ENCOUNTER — Ambulatory Visit: Payer: Medicare Other | Attending: Physician Assistant | Admitting: Physician Assistant

## 2022-10-24 VITALS — BP 110/78 | HR 78 | Ht 72.0 in | Wt 183.6 lb

## 2022-10-24 DIAGNOSIS — I471 Supraventricular tachycardia, unspecified: Secondary | ICD-10-CM | POA: Diagnosis not present

## 2022-10-24 DIAGNOSIS — E876 Hypokalemia: Secondary | ICD-10-CM

## 2022-10-24 DIAGNOSIS — E785 Hyperlipidemia, unspecified: Secondary | ICD-10-CM

## 2022-10-24 NOTE — Patient Instructions (Signed)
Medication Instructions:  Your physician recommends that you continue on your current medications as directed. Please refer to the Current Medication list given to you today.  *If you need a refill on your cardiac medications before your next appointment, please call your pharmacy*  Lab Work: CMET, MAG, TSH-TODAY If you have labs (blood work) drawn today and your tests are completely normal, you will receive your results only by: MyChart Message (if you have MyChart) OR A paper copy in the mail If you have any lab test that is abnormal or we need to change your treatment, we will call you to review the results.  Follow-Up: At [facility], you and your health needs are our priority.  As part of our continuing mission to provide you with exceptional heart care, we have created designated Provider Care Teams.  These Care Teams include your primary Cardiologist (physician) and Advanced Practice Providers (APPs -  Physician Assistants and Nurse Practitioners) who all work together to provide you with the care you need, when you need it.  Your next appointment:   6 month(s)  Provider:   Donato Schultz, MD     Heart-Healthy Eating Plan Many factors influence your heart health, including eating and exercise habits. Heart health is also called coronary health. Coronary risk increases with abnormal blood fat (lipid) levels. A heart-healthy eating plan includes limiting unhealthy fats, increasing healthy fats, limiting salt (sodium) intake, and making other diet and lifestyle changes. What is my plan? Your health care provider may recommend that: You limit your fat intake to _________% or less of your total calories each day. You limit your saturated fat intake to _________% or less of your total calories each day. You limit the amount of cholesterol in your diet to less than _________ mg per day. You limit the amount of sodium in your diet to less than _________ mg per day. What are tips for  following this plan? Cooking Cook foods using methods other than frying. Baking, boiling, grilling, and broiling are all good options. Other ways to reduce fat include: Removing the skin from poultry. Removing all visible fats from meats. Steaming vegetables in water or broth. Meal planning  At meals, imagine dividing your plate into fourths: Fill one-half of your plate with vegetables and green salads. Fill one-fourth of your plate with whole grains. Fill one-fourth of your plate with lean protein foods. Eat 2-4 cups of vegetables per day. One cup of vegetables equals 1 cup (91 g) broccoli or cauliflower florets, 2 medium carrots, 1 large bell pepper, 1 large sweet potato, 1 large tomato, 1 medium white potato, 2 cups (150 g) raw leafy greens. Eat 1-2 cups of fruit per day. One cup of fruit equals 1 small apple, 1 large banana, 1 cup (237 g) mixed fruit, 1 large orange,  cup (82 g) dried fruit, 1 cup (240 mL) 100% fruit juice. Eat more foods that contain soluble fiber. Examples include apples, broccoli, carrots, beans, peas, and barley. Aim to get 25-30 g of fiber per day. Increase your consumption of legumes, nuts, and seeds to 4-5 servings per week. One serving of dried beans or legumes equals  cup (90 g) cooked, 1 serving of nuts is  oz (12 almonds, 24 pistachios, or 7 walnut halves), and 1 serving of seeds equals  oz (8 g). Fats Choose healthy fats more often. Choose monounsaturated and polyunsaturated fats, such as olive and canola oils, avocado oil, flaxseeds, walnuts, almonds, and seeds. Eat more omega-3 fats.  Choose salmon, mackerel, sardines, tuna, flaxseed oil, and ground flaxseeds. Aim to eat fish at least 2 times each week. Check food labels carefully to identify foods with trans fats or high amounts of saturated fat. Limit saturated fats. These are found in animal products, such as meats, butter, and cream. Plant sources of saturated fats include palm oil, palm kernel oil,  and coconut oil. Avoid foods with partially hydrogenated oils in them. These contain trans fats. Examples are stick margarine, some tub margarines, cookies, crackers, and other baked goods. Avoid fried foods. General information Eat more home-cooked food and less restaurant, buffet, and fast food. Limit or avoid alcohol. Limit foods that are high in added sugar and simple starches such as foods made using white refined flour (white breads, pastries, sweets). Lose weight if you are overweight. Losing just 5-10% of your body weight can help your overall health and prevent diseases such as diabetes and heart disease. Monitor your sodium intake, especially if you have high blood pressure. Talk with your health care provider about your sodium intake. Try to incorporate more vegetarian meals weekly. What foods should I eat? Fruits All fresh, canned (in natural juice), or frozen fruits. Vegetables Fresh or frozen vegetables (raw, steamed, roasted, or grilled). Green salads. Grains Most grains. Choose whole wheat and whole grains most of the time. Rice and pasta, including brown rice and pastas made with whole wheat. Meats and other proteins Lean, well-trimmed beef, veal, pork, and lamb. Chicken and [place] without skin. All fish and shellfish. Wild duck, rabbit, pheasant, and venison. Egg whites or low-cholesterol egg substitutes. Dried beans, peas, lentils, and tofu. Seeds and most nuts. Dairy Low-fat or nonfat cheeses, including ricotta and mozzarella. Skim or 1% milk (liquid, powdered, or evaporated). Buttermilk made with low-fat milk. Nonfat or low-fat yogurt. Fats and oils Non-hydrogenated (trans-free) margarines. Vegetable oils, including soybean, sesame, sunflower, olive, avocado, peanut, safflower, corn, canola, and cottonseed. Salad dressings or mayonnaise made with a vegetable oil. Beverages Water (mineral or sparkling). Coffee and tea. Unsweetened ice tea. Diet beverages. Sweets and  desserts Sherbet, gelatin, and fruit ice. Small amounts of dark chocolate. Limit all sweets and desserts. Seasonings and condiments All seasonings and condiments. The items listed above may not be a complete list of foods and beverages you can eat. Contact a dietitian for more options. What foods should I avoid? Fruits Canned fruit in heavy syrup. Fruit in cream or butter sauce. Fried fruit. Limit coconut. Vegetables Vegetables cooked in cheese, cream, or butter sauce. Fried vegetables. Grains Breads made with saturated or trans fats, oils, or whole milk. Croissants. Sweet rolls. Donuts. High-fat crackers, such as cheese crackers and chips. Meats and other proteins Fatty meats, such as hot dogs, ribs, sausage, bacon, rib-eye roast or steak. High-fat deli meats, such as salami and bologna. Caviar. Domestic duck and goose. Organ meats, such as liver. Dairy Cream, sour cream, cream cheese, and creamed cottage cheese. Whole-milk cheeses. Whole or 2% milk (liquid, evaporated, or condensed). Whole buttermilk. Cream sauce or high-fat cheese sauce. Whole-milk yogurt. Fats and oils Meat fat, or shortening. Cocoa butter, hydrogenated oils, palm oil, coconut oil, palm kernel oil. Solid fats and shortenings, including bacon fat, salt pork, lard, and butter. Nondairy cream substitutes. Salad dressings with cheese or sour cream. Beverages Regular sodas and any drinks with added sugar. Sweets and desserts Frosting. Pudding. Cookies. Cakes. Pies. Milk chocolate or white chocolate. Buttered syrups. Full-fat ice cream or ice cream drinks. The items listed above may not be a  complete list of foods and beverages to avoid. Contact a dietitian for more information. Summary Heart-healthy meal planning includes limiting unhealthy fats, increasing healthy fats, limiting salt (sodium) intake and making other diet and lifestyle changes. Lose weight if you are overweight. Losing just 5-10% of your body weight can help  your overall health and prevent diseases such as diabetes and heart disease. Focus on eating a balance of foods, including fruits and vegetables, low-fat or nonfat dairy, lean protein, nuts and legumes, whole grains, and heart-healthy oils and fats. This information is not intended to replace advice given to you by your health care provider. Make sure you discuss any questions you have with your health care provider. Document Revised: 04/01/2021 Document Reviewed: 04/01/2021 Elsevier Patient Education  2024 [employer] Low-Sodium Eating Plan Salt (sodium) helps you keep a healthy balance of fluids in your body. Too much sodium can raise your blood pressure. It can also cause fluid and waste to be held in your body. Your health care provider or dietitian may recommend a low-sodium eating plan if you have high blood pressure (hypertension), kidney disease, liver disease, or heart failure. Eating less sodium can help lower your blood pressure and reduce swelling. It can also protect your heart, liver, and kidneys. What are tips for following this plan? Reading food labels  Check food labels for the amount of sodium per serving. If you eat more than one serving, you must multiply the listed amount by the number of servings. Choose foods with less than 140 milligrams (mg) of sodium per serving. Avoid foods with 300 mg of sodium or more per serving. Always check how much sodium is in a product, even if the label says "unsalted" or "no salt added." Shopping  Buy products labeled as "low-sodium" or "no salt added." Buy fresh foods. Avoid canned foods and pre-made or frozen meals. Avoid canned, cured, or processed meats. Buy breads that have less than 80 mg of sodium per slice. Cooking  Eat more home-cooked food. Try to eat less restaurant, buffet, and fast food. Try not to add salt when you cook. Use salt-free seasonings or herbs instead of table salt or sea salt. Check with your provider or  pharmacist before using salt substitutes. Cook with plant-based oils, such as canola, sunflower, or olive oil. Meal planning When eating at a restaurant, ask if your food can be made with less salt or no salt. Avoid dishes labeled as brined, pickled, cured, or smoked. Avoid dishes made with soy sauce, miso, or teriyaki sauce. Avoid foods that have monosodium glutamate (MSG) in them. MSG may be added to some restaurant food, sauces, soups, bouillon, and canned foods. Make meals that can be grilled, baked, poached, roasted, or steamed. These are often made with less sodium. General information Try to limit your sodium intake to 1,500-2,300 mg each day, or the amount told by your provider. What foods should I eat? Fruits Fresh, frozen, or canned fruit. Fruit juice. Vegetables Fresh or frozen vegetables. "No salt added" canned vegetables. "No salt added" tomato sauce and paste. Low-sodium or reduced-sodium tomato and vegetable juice. Grains Low-sodium cereals, such as oats, puffed wheat and rice, and shredded wheat. Low-sodium crackers. Unsalted rice. Unsalted pasta. Low-sodium bread. Whole grain breads and whole grain pasta. Meats and other proteins Fresh or frozen meat, poultry, seafood, and fish. These should have no added salt. Low-sodium canned tuna and salmon. Unsalted nuts. Dried peas, beans, and lentils without added salt. Unsalted canned beans. Eggs. Unsalted nut butters.  Dairy Milk. Soy milk. Cheese that is naturally low in sodium, such as ricotta cheese, fresh mozzarella, or Swiss cheese. Low-sodium or reduced-sodium cheese. Cream cheese. Yogurt. Seasonings and condiments Fresh and dried herbs and spices. Salt-free seasonings. Low-sodium mustard and ketchup. Sodium-free salad dressing. Sodium-free light mayonnaise. Fresh or refrigerated horseradish. Lemon juice. Vinegar. Other foods Homemade, reduced-sodium, or low-sodium soups. Unsalted popcorn and pretzels. Low-salt or salt-free  chips. The items listed above may not be all the foods and drinks you can have. Talk to a dietitian to learn more. What foods should I avoid? Vegetables Sauerkraut, pickled vegetables, and relishes. Olives. [place] fries. Onion rings. Regular canned vegetables, except low-sodium or reduced-sodium items. Regular canned tomato sauce and paste. Regular tomato and vegetable juice. Frozen vegetables in sauces. Grains Instant hot cereals. Bread stuffing, pancake, and biscuit mixes. Croutons. Seasoned rice or pasta mixes. Noodle soup cups. Boxed or frozen macaroni and cheese. Regular salted crackers. Self-rising flour. Meats and other proteins Meat or fish that is salted, canned, smoked, spiced, or pickled. Precooked or cured meat, such as sausages or meat loaves. Tomasa Blase. Ham. Pepperoni. Hot dogs. Corned beef. Chipped beef. Salt pork. Jerky. Pickled herring, anchovies, and sardines. Regular canned tuna. Salted nuts. Dairy Processed cheese and cheese spreads. Hard cheeses. Cheese curds. Blue cheese. Feta cheese. String cheese. Regular cottage cheese. Buttermilk. Canned milk. Fats and oils Salted butter. Regular margarine. Ghee. Bacon fat. Seasonings and condiments Onion salt, garlic salt, seasoned salt, table salt, and sea salt. Canned and packaged gravies. Worcestershire sauce. Tartar sauce. Barbecue sauce. Teriyaki sauce. Soy sauce, including reduced-sodium soy sauce. Steak sauce. Fish sauce. Oyster sauce. Cocktail sauce. Horseradish that you find on the shelf. Regular ketchup and mustard. Meat flavorings and tenderizers. Bouillon cubes. Hot sauce. Pre-made or packaged marinades. Pre-made or packaged taco seasonings. Relishes. Regular salad dressings. Salsa. Other foods Salted popcorn and pretzels. Corn chips and puffs. Potato and tortilla chips. Canned or dried soups. Pizza. Frozen entrees and pot pies. The items listed above may not be all the foods and drinks you should avoid. Talk to a dietitian to  learn more. This information is not intended to replace advice given to you by your health care provider. Make sure you discuss any questions you have with your health care provider. Document Revised: 03/13/2022 Document Reviewed: 03/13/2022 Elsevier Patient Education  2024 [employer].

## 2022-10-24 NOTE — Progress Notes (Signed)
Cardiology Office Note:  .   Date:  10/24/2022  ID:  Brandon Muscat., DOB Aug 07, 1950, MRN [id number] PCP: [name], MD  [facility] Providers Cardiologist:  Donato Schultz, MD {  History of Present Illness: .   Brandon Jean. is a 72 y.o. 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  Last chemotherapy was on 09/24/2022.  Patient had SVT while in the hospital and her potassium was Greater than 4 magnesium was Greater than 2.  SVT has been the explanation for generalized weakness and shortness of breath.  Started on metoprolol.  Ultimately, this is his reason for his office visit today.  Today, he tells me he does not feel like he is having any fast or irregular heartbeats.  Asymptomatic at this time.  He states that unfortunately the radiation "cooked" his adrenal glands and now he is on chronic prednisone.  He thinks that his recent hospitalization was mainly because he missed a dose of his prednisone.  He does take 7.5 mg daily and was told never to miss this medication.  Heart rate 78 today and blood pressure well-controlled.  He remains on low-dose metoprolol 25 mg twice a day and feels like his symptoms have improved on this low dose.  Since his electrolytes were all low in the hospital, we have ordered repeat basic metabolic panel and magnesium as well as TSH today.  Reports no shortness of breath nor dyspnea on exertion. Reports no chest pain, pressure, or tightness. No edema, orthopnea, PND. Reports no palpitations.    ROS: Pertinent ROS in HPI  Studies Reviewed: .       Echocardiogram 10/06/2022 IMPRESSIONS     1. Left ventricular ejection fraction, by estimation, is 60 to 65%. The   left ventricle has normal function. The left ventricle has no regional  wall motion abnormalities. Left ventricular diastolic parameters are  indeterminate.   2. Right ventricular systolic function is normal. The right ventricular  size is normal.   3. The mitral valve is normal in structure. No evidence of mitral valve  regurgitation. No evidence of mitral stenosis.   4. The aortic valve is normal in structure. Aortic valve regurgitation is  not visualized. No aortic stenosis is present.   5. There is mild dilatation of the ascending aorta, measuring 40 mm.  There is borderline dilatation of the aortic root, measuring 38 mm.   6. The inferior vena cava is normal in size with greater than 50%  respiratory variability, suggesting right atrial pressure of 3 mmHg.   FINDINGS   Left Ventricle: Left ventricular ejection fraction, by estimation, is 60  to 65%. The left ventricle has normal function. The left ventricle has no  regional wall motion abnormalities. The left ventricular internal cavity  size was normal in size. There is   no left ventricular hypertrophy. Left ventricular diastolic parameters  are indeterminate.   Right Ventricle: The right ventricular size is normal. No increase in  right ventricular wall thickness. Right ventricular systolic function is  normal.   Left Atrium: Left atrial size was normal in size.   Right Atrium: Right atrial size was normal in size.   Pericardium: There is no evidence of pericardial effusion.   Mitral Valve: The mitral valve  is normal in structure. No evidence of  mitral valve regurgitation. No evidence of mitral valve stenosis. MV peak  gradient, 3.2 mmHg. The mean mitral valve gradient is 1.0 mmHg.   Tricuspid Valve: The tricuspid valve is normal in structure. Tricuspid  valve regurgitation is not demonstrated. No evidence of tricuspid  stenosis.   Aortic Valve: The aortic valve is normal in structure. Aortic valve  regurgitation  is not visualized. No aortic stenosis is present.   Pulmonic Valve: The pulmonic valve was normal in structure. Pulmonic valve  regurgitation is not visualized. No evidence of pulmonic stenosis.   Aorta: The aortic root is normal in size and structure. There is mild  dilatation of the ascending aorta, measuring 40 mm. There is borderline  dilatation of the aortic root, measuring 38 mm.   Venous: The inferior vena cava is normal in size with greater than 50%  respiratory variability, suggesting right atrial pressure of 3 mmHg.   IAS/Shunts: No atrial level shunt detected by color flow Doppler.       Physical Exam:   VS:  BP 110/78   Pulse 78   Ht 6' (1.829 m)   Wt 183 lb 9.6 oz (83.3 kg)   SpO2 96%   BMI 24.90 kg/m    Wt Readings from Last 3 Encounters:  10/24/22 183 lb 9.6 oz (83.3 kg)  10/13/22 184 lb (83.5 kg)  10/06/22 187 lb 9.8 oz (85.1 kg)    GEN: Well nourished, well developed in no acute distress NECK: No JVD; No carotid bruits CARDIAC: RRR, no murmurs, rubs, gallops RESPIRATORY:  Clear to auscultation without rales, wheezing or rhonchi  ABDOMEN: Soft, non-tender, non-distended EXTREMITIES:  No edema; No deformity   ASSESSMENT AND PLAN: .   1.  SVT -no heart monitor at this time indicated -continue to monitor and is symptomatic he is to call our office  2.  Hypokalemia -check BMP and mag -TSH today  3.  Hypomagnesium -check BMP and mag  -TSH today  4.  Hyperlipidemia -lipid panel ordered for next follow-up  5.  Recurrent metastatic non-small cell Carcinoma -per heme/onc     Dispo: Would recommend 9-month follow-up with Dr. Anne Fu  Signed, Sharlene Dory, PA-C

## 2022-10-25 ENCOUNTER — Other Ambulatory Visit: Payer: Self-pay | Admitting: Physician Assistant

## 2022-10-25 ENCOUNTER — Encounter: Payer: Self-pay | Admitting: Internal Medicine

## 2022-10-25 DIAGNOSIS — R112 Nausea with vomiting, unspecified: Secondary | ICD-10-CM

## 2022-10-25 LAB — TSH: TSH: 1.09 u[IU]/mL (ref 0.450–4.500)

## 2022-10-25 LAB — COMPREHENSIVE METABOLIC PANEL
ALT: 47 IU/L — ABNORMAL HIGH (ref 0–44)
AST: 57 IU/L — ABNORMAL HIGH (ref 0–40)
Albumin: 3.9 g/dL (ref 3.8–4.8)
Alkaline Phosphatase: 61 IU/L (ref 44–121)
BUN/Creatinine Ratio: 8 — ABNORMAL LOW (ref 10–24)
BUN: 9 mg/dL (ref 8–27)
Bilirubin Total: 0.2 mg/dL (ref 0.0–1.2)
CO2: 22 mmol/L (ref 20–29)
Calcium: 8.2 mg/dL — ABNORMAL LOW (ref 8.6–10.2)
Chloride: 100 mmol/L (ref 96–106)
Creatinine, Ser: 1.12 mg/dL (ref 0.76–1.27)
Globulin, Total: 3 g/dL (ref 1.5–4.5)
Glucose: 133 mg/dL — ABNORMAL HIGH (ref 70–99)
Potassium: 4.1 mmol/L (ref 3.5–5.2)
Sodium: 137 mmol/L (ref 134–144)
Total Protein: 6.9 g/dL (ref 6.0–8.5)
eGFR: 69 mL/min/{1.73_m2} (ref >59)

## 2022-10-25 LAB — MAGNESIUM: Magnesium: 0.9 mg/dL — CL (ref 1.6–2.3)

## 2022-10-27 ENCOUNTER — Other Ambulatory Visit: Payer: Self-pay | Admitting: Internal Medicine

## 2022-10-27 DIAGNOSIS — C3491 Malignant neoplasm of unspecified part of right bronchus or lung: Secondary | ICD-10-CM

## 2022-10-29 ENCOUNTER — Ambulatory Visit (HOSPITAL_COMMUNITY)
Admission: RE | Admit: 2022-10-29 | Discharge: 2022-10-29 | Disposition: A | Discharge status: Home / Self Care | Payer: Medicare Other | Source: Ambulatory Visit | Attending: Internal Medicine | Admitting: Internal Medicine

## 2022-10-29 DIAGNOSIS — I700 Atherosclerosis of aorta: Secondary | ICD-10-CM | POA: Diagnosis not present

## 2022-10-29 DIAGNOSIS — C3490 Malignant neoplasm of unspecified part of unspecified bronchus or lung: Secondary | ICD-10-CM | POA: Insufficient documentation

## 2022-10-29 DIAGNOSIS — J432 Centrilobular emphysema: Secondary | ICD-10-CM | POA: Diagnosis not present

## 2022-10-29 MED ORDER — SODIUM CHLORIDE (PF) 0.9 % IJ SOLN
INTRAMUSCULAR | Status: AC
  Filled 2022-10-29: qty 50

## 2022-10-29 MED ORDER — IOHEXOL 300 MG/ML  SOLN
100.0000 mL | Freq: Once | INTRAMUSCULAR | Status: AC | PRN
  Administered 2022-10-29: 100 mL via INTRAVENOUS

## 2022-10-31 ENCOUNTER — Encounter: Payer: Self-pay | Admitting: Internal Medicine

## 2022-10-31 ENCOUNTER — Ambulatory Visit (INDEPENDENT_AMBULATORY_CARE_PROVIDER_SITE_OTHER): Payer: Medicare Other | Admitting: Internal Medicine

## 2022-10-31 VITALS — BP 112/80 | HR 68 | Temp 97.8°F | Ht 72.0 in | Wt 183.0 lb

## 2022-10-31 DIAGNOSIS — C3491 Malignant neoplasm of unspecified part of right bronchus or lung: Secondary | ICD-10-CM | POA: Diagnosis not present

## 2022-10-31 MED ORDER — HYDROCODONE-ACETAMINOPHEN 10-325 MG PO TABS: 1.0000 | ORAL_TABLET | Freq: Two times a day (BID) | ORAL | 0 refills | Status: DC | PRN

## 2022-10-31 NOTE — Progress Notes (Signed)
   Subjective:   Patient ID: Brandon Muscat., male    DOB: March 21, 1950, 72 y.o.   MRN: [id number]  Medication Refill Associated symptoms include arthralgias and fatigue. Pertinent negatives include no abdominal pain, chest pain, coughing, nausea or vomiting.   The patient is a 72 YO man coming in for hospital follow up. Discharged end of July for weakness. Recent repeat CT abdomen/pelvis and chest not read yet. Seeing oncology Monday. No changes to health and overall stable.   Review of Systems  Constitutional:  Positive for fatigue.  HENT: Negative.    Eyes: Negative.   Respiratory:  Negative for cough, chest tightness and shortness of breath.   Cardiovascular:  Negative for chest pain, palpitations and leg swelling.  Gastrointestinal:  Negative for abdominal distention, abdominal pain, constipation, diarrhea, nausea and vomiting.  Musculoskeletal:  Positive for arthralgias and back pain.  Skin: Negative.   Neurological: Negative.   Psychiatric/Behavioral: Negative.      Objective:  Physical Exam Constitutional:      Appearance: He is well-developed.  HENT:     Head: Normocephalic and atraumatic.  Cardiovascular:     Rate and Rhythm: Normal rate and regular rhythm.  Pulmonary:     Effort: Pulmonary effort is normal. No respiratory distress.     Breath sounds: Rhonchi present. No wheezing or rales.     Comments: Stable lung exam Abdominal:     General: Bowel sounds are normal. There is no distension.     Palpations: Abdomen is soft.     Tenderness: There is no abdominal tenderness. There is no rebound.  Musculoskeletal:     Cervical back: Normal range of motion.  Skin:    General: Skin is warm and dry.  Neurological:     Mental Status: He is alert and oriented to person, place, and time.     Coordination: Coordination normal.     Vitals:   10/31/22 0835  BP: 112/80  Pulse: 68  Temp: 97.8 F (36.6 C)  TempSrc: Oral  SpO2: 94%  Weight: 183 lb (83 kg)  Height:  6' (1.829 m)    Assessment & Plan:  Visit time 15 minutes in face to face communication with patient and coordination of care, additional 5 minutes spent in record review, coordination or care, ordering tests, communicating/referring to other healthcare professionals, documenting in medical records all on the same day of the visit for total time 20 minutes spent on the visit.

## 2022-10-31 NOTE — Assessment & Plan Note (Signed)
With pain and refilled his hydrocodone. He does use 5/325 #60 per month. Reviewed [place] database and appropriate.

## 2022-11-03 ENCOUNTER — Inpatient Hospital Stay: Payer: Medicare Other

## 2022-11-03 ENCOUNTER — Other Ambulatory Visit: Payer: Self-pay | Admitting: Internal Medicine

## 2022-11-03 ENCOUNTER — Inpatient Hospital Stay (HOSPITAL_BASED_OUTPATIENT_CLINIC_OR_DEPARTMENT_OTHER): Payer: Medicare Other | Admitting: Internal Medicine

## 2022-11-03 VITALS — BP 110/60 | HR 75 | Temp 97.7°F | Resp 17 | Ht 72.0 in | Wt 184.0 lb

## 2022-11-03 DIAGNOSIS — Z5112 Encounter for antineoplastic immunotherapy: Secondary | ICD-10-CM | POA: Diagnosis not present

## 2022-11-03 DIAGNOSIS — C3491 Malignant neoplasm of unspecified part of right bronchus or lung: Secondary | ICD-10-CM | POA: Diagnosis not present

## 2022-11-03 DIAGNOSIS — Z5111 Encounter for antineoplastic chemotherapy: Secondary | ICD-10-CM | POA: Diagnosis not present

## 2022-11-03 DIAGNOSIS — Z79899 Other long term (current) drug therapy: Secondary | ICD-10-CM | POA: Diagnosis not present

## 2022-11-03 DIAGNOSIS — Z923 Personal history of irradiation: Secondary | ICD-10-CM | POA: Diagnosis not present

## 2022-11-03 DIAGNOSIS — C3431 Malignant neoplasm of lower lobe, right bronchus or lung: Secondary | ICD-10-CM | POA: Diagnosis not present

## 2022-11-03 LAB — CBC WITH DIFFERENTIAL (CANCER CENTER ONLY)
Abs Immature Granulocytes: 0.04 10*3/uL (ref 0.00–0.07)
Basophils Absolute: 0.1 10*3/uL (ref 0.0–0.1)
Basophils Relative: 1 %
Eosinophils Absolute: 0.3 10*3/uL (ref 0.0–0.5)
Eosinophils Relative: 3 %
HCT: 33.1 % — ABNORMAL LOW (ref 39.0–52.0)
Hemoglobin: 11.1 g/dL — ABNORMAL LOW (ref 13.0–17.0)
Immature Granulocytes: 1 %
Lymphocytes Relative: 20 %
Lymphs Abs: 1.7 10*3/uL (ref 0.7–4.0)
MCH: 32.3 pg (ref 26.0–34.0)
MCHC: 33.5 g/dL (ref 30.0–36.0)
MCV: 96.2 fL (ref 80.0–100.0)
Monocytes Absolute: 0.7 10*3/uL (ref 0.1–1.0)
Monocytes Relative: 8 %
Neutro Abs: 5.9 10*3/uL (ref 1.7–7.7)
Neutrophils Relative %: 67 %
Platelet Count: 220 10*3/uL (ref 150–400)
RBC: 3.44 MIL/uL — ABNORMAL LOW (ref 4.22–5.81)
RDW: 14.6 % (ref 11.5–15.5)
WBC Count: 8.7 10*3/uL (ref 4.0–10.5)
nRBC: 0 % (ref 0.0–0.2)

## 2022-11-03 LAB — CMP (CANCER CENTER ONLY)
ALT: 19 U/L (ref 0–44)
AST: 26 U/L (ref 15–41)
Albumin: 3.9 g/dL (ref 3.5–5.0)
Alkaline Phosphatase: 53 U/L (ref 38–126)
Anion gap: 11 (ref 5–15)
BUN: 8 mg/dL (ref 8–23)
CO2: 23 mmol/L (ref 22–32)
Calcium: 7.6 mg/dL — ABNORMAL LOW (ref 8.9–10.3)
Chloride: 106 mmol/L (ref 98–111)
Creatinine: 0.97 mg/dL (ref 0.61–1.24)
GFR, Estimated: >60 mL/min (ref >60)
Glucose, Bld: 109 mg/dL — ABNORMAL HIGH (ref 70–99)
Potassium: 3.5 mmol/L (ref 3.5–5.1)
Sodium: 140 mmol/L (ref 135–145)
Total Bilirubin: 0.4 mg/dL (ref 0.3–1.2)
Total Protein: 7.3 g/dL (ref 6.5–8.1)

## 2022-11-03 LAB — TSH: TSH: 3.695 u[IU]/mL (ref 0.350–4.500)

## 2022-11-03 MED ORDER — MAGNESIUM GLUCONATE 550 MG PO TABS: 1.0000 | ORAL_TABLET | Freq: Two times a day (BID) | ORAL | 3 refills | Status: DC

## 2022-11-03 NOTE — Progress Notes (Signed)
[facility] [phone]   [phone]  OFFICE PROGRESS NOTE  Brandon Broker, MD [address] [place] [address]  DIAGNOSIS: Stage IIIA (T4, N0, M0) non-small Rubio lung cancer, favoring adenocarcinoma presented with large right lower lobe lung mass with no mediastinal lymphadenopathy or extrathoracic metastasis diagnosed in October 2022. Had disease progression with enlarging mass and new right hilar and mediastinal lymph nodes in December 2022.      PRIOR THERAPY:  1) Neoadjuvant treatment with carboplatin for AUC of 5, Alimta 500 Mg/M2 and nivolumab 360 Mg IV every 3 weeks.  First dose January 23, 2021.  Status post 3 cycles. 2) Concurrent chemoradiation with carboplatin for an AUC of 2 and paclitaxel 45 mg per metered squared.  First dose expected on 04/01/2021.  Status post 4 cycles 3) Consolidation treatment with immunotherapy with Imfinzi 1500 Mg IV every 4 weeks.  First dose on Jul 08, 2021.  Status post 9 cycles.    CURRENT THERAPY: Palliative systemic chemotherapy with carboplatin for an AUC of 5, Alimta 500 mg/m, Keytruda 200 mg IV every 3 weeks. First dose on 05/13/2022.  Status post 8 cycles.  Starting him cycle #3 his dose of carboplatin will be reduced to AUC of 4 and Alimta 400 Mg/M2 secondary to intolerance.  INTERVAL HISTORY: Brandon Rubio. 72 y.o. male returns to the clinic today for follow-up visit.  The patient is feeling fine today with no concerning complaints except for fatigue.  He has a rough time after cycle #8 with significant fatigue and weakness as well as diarrhea that lasted for around 17 days.  He is worried about taking his treatment today.  He denied having any current chest pain, shortness of breath, cough or hemoptysis.  He has no current nausea, vomiting, diarrhea or constipation.  He has no headache or visual changes.  He has no recent weight loss or night sweats.  He is here today for evaluation with  repeat CT scan of the chest, abdomen and pelvis for restaging of his disease.   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  MEDICATIONS:  Current Outpatient Medications  Medication Sig Dispense Refill   aspirin EC 81 MG tablet Take 81 mg by mouth in the morning. Swallow whole.     bisacodyl (DULCOLAX) 5 MG EC tablet Take 5 mg by mouth daily as needed for moderate constipation.     folic acid (FOLVITE) 1 MG tablet TAKE 1 TABLET BY MOUTH EVERY DAY 30 tablet 2   HYDROcodone-acetaminophen (NORCO) 10-325 MG tablet Take 1 tablet by mouth every 12 (twelve) hours as needed. 60 tablet 0   Magnesium Gluconate 550 MG TABS Take 1 tablet (550 mg total) by mouth 2 (two) times daily. 60 tablet 3   metFORMIN (GLUCOPHAGE) 1000 MG tablet TAKE 1 TABLET(1000 MG) BY MOUTH TWICE DAILY WITH A MEAL (Patient taking differently: Take  1,000 mg by mouth 2 (two) times daily with a meal.) 180 tablet 1   metoprolol tartrate (LOPRESSOR) 25 MG tablet Take 1 tablet (25 mg total) by mouth 2 (two) times daily. 60 tablet 3   Multiple Vitamins-Minerals (CENTRUM SILVER 50+MEN) TABS Take 1 tablet by mouth daily with breakfast.     omeprazole (PRILOSEC) 20 MG capsule TAKE 1 CAPSULE BY MOUTH TWICE DAILY BEFORE A MEAL (Patient taking differently: Take 20 mg by mouth 2 (two) times daily before a meal.) 180 capsule 1   ondansetron (ZOFRAN) 8 MG tablet Take 1 tablet (8 mg total) by mouth every 8 (eight) hours as needed for nausea or vomiting. 30 tablet 2    predniSONE (DELTASONE) 5 MG tablet Take 1-1.5 tablets (5-7.5 mg total) by mouth daily with breakfast. (Patient taking differently: Take 7.5 mg by mouth daily with breakfast.) 135 tablet 3   prochlorperazine (COMPAZINE) 10 MG tablet TAKE 1 TABLET(10 MG) BY MOUTH EVERY 6 HOURS FOR UP TO 14 DAYS AS NEEDED FOR NAUSEA OR VOMITING 60 tablet 1   simvastatin (ZOCOR) 40 MG tablet TAKE 1 TABLET BY MOUTH EVERY EVENING AT 6 PM. (Patient taking differently: Take 40 mg by mouth in the morning.) 90 tablet 3   No current facility-administered medications for this visit.    SURGICAL HISTORY:  Past Surgical History:  Procedure Laterality Date   BRONCHIAL BIOPSY  12/10/2020   Procedure: BRONCHIAL BIOPSIES;  Surgeon: Leslye Peer, MD;  Location: [facility] ENDOSCOPY;  Service: Pulmonary;;   BRONCHIAL BRUSHINGS  12/10/2020   Procedure: BRONCHIAL BRUSHINGS;  Surgeon: Leslye Peer, MD;  Location: [facility] ENDOSCOPY;  Service: Pulmonary;;   BRONCHIAL NEEDLE ASPIRATION BIOPSY  12/10/2020   Procedure: BRONCHIAL NEEDLE ASPIRATION BIOPSIES;  Surgeon: Leslye Peer, MD;  Location: [facility] ENDOSCOPY;  Service: Pulmonary;;   COLONOSCOPY     COLONOSCOPY W/ POLYPECTOMY  03/10/2009   LARYNGOSCOPY  03/10/1997   VIDEO BRONCHOSCOPY WITH ENDOBRONCHIAL NAVIGATION N/A 12/10/2020   Procedure: ROBOTIC VIDEO BRONCHOSCOPY WITH ENDOBRONCHIAL NAVIGATION;  Surgeon: Leslye Peer, MD;  Location: [facility] ENDOSCOPY;  Service: Pulmonary;  Laterality: N/A;    REVIEW OF SYSTEMS:  Constitutional: positive for fatigue Eyes: negative Ears, nose, mouth, throat, and face: negative Respiratory: negative Cardiovascular: negative Gastrointestinal: negative Genitourinary:negative Integument/breast: negative Hematologic/lymphatic: negative Musculoskeletal:negative Neurological: negative Behavioral/Psych: negative Endocrine: negative Allergic/Immunologic: negative   PHYSICAL EXAMINATION: General appearance: alert, cooperative, and no distress Head:  Normocephalic, without obvious abnormality, atraumatic Neck: no adenopathy, no JVD, supple, symmetrical, trachea midline, and thyroid not enlarged, symmetric, no tenderness/mass/nodules Lymph nodes: Cervical, supraclavicular, and axillary nodes normal. Resp: clear to auscultation bilaterally Back: symmetric, no curvature. ROM normal. No CVA tenderness. Cardio: regular rate and rhythm, S1, S2 normal, no murmur, click, rub or gallop GI: soft, non-tender; bowel sounds normal; no masses,  no organomegaly Extremities: extremities normal, atraumatic, no cyanosis or edema Neurologic: Alert and oriented X 3, normal strength and tone. Normal symmetric reflexes. Normal coordination and gait  ECOG PERFORMANCE STATUS: 1 - Symptomatic but completely ambulatory  Blood pressure 110/60, pulse 75, temperature 97.7 F (36.5 C), temperature source Oral, resp. rate 17, height 6' (1.829 m), weight 184 lb (83.5 kg), SpO2 99%.  LABORATORY DATA: Lab Results  Component Value Date   WBC 8.7 11/03/2022   HGB 11.1 (L) 11/03/2022   HCT 33.1 (L) 11/03/2022   MCV 96.2 11/03/2022   PLT 220 11/03/2022      Chemistry      Component Value Date/Time   NA 137 10/24/2022  1540   K 4.1 10/24/2022 1540   CL 100 10/24/2022 1540   CO2 22 10/24/2022 1540   BUN 9 10/24/2022 1540   CREATININE 1.12 10/24/2022 1540   CREATININE 1.05 10/13/2022 0952      Component Value Date/Time   CALCIUM 8.2 (L) 10/24/2022 1540   ALKPHOS 61 10/24/2022 1540   AST 57 (H) 10/24/2022 1540   AST 24 10/13/2022 0952   ALT 47 (H) 10/24/2022 1540   ALT 26 10/13/2022 0952   BILITOT 0.2 10/24/2022 1540   BILITOT 0.3 10/13/2022 2841       RADIOGRAPHIC STUDIES: CT Chest W Contrast  Result Date: 10/31/2022 CLINICAL DATA:  Non-small Rubio lung cancer restaging * Tracking Code: BO * EXAM: CT CHEST, ABDOMEN, AND PELVIS WITH CONTRAST TECHNIQUE: Multidetector CT imaging of the chest, abdomen and pelvis was performed following the standard protocol  during bolus administration of intravenous contrast. RADIATION DOSE REDUCTION: This exam was performed according to the departmental dose-optimization program which includes automated exposure control, adjustment of the mA and/or kV according to patient size and/or use of iterative reconstruction technique. CONTRAST:  OMNIPAQUE IOHEXOL 300 MG/ML  SOLN COMPARISON:  CT chest, 08/29/2022, CT chest abdomen pelvis, 07/18/2022 FINDINGS: CT CHEST FINDINGS Cardiovascular: Aortic atherosclerosis. Normal heart size. Three-vessel coronary artery calcifications. No pericardial effusion. Mediastinum/Nodes: No enlarged mediastinal, hilar, or axillary lymph nodes. Thyroid gland, trachea, and esophagus demonstrate no significant findings. Lungs/Pleura: Moderate centrilobular and paraseptal emphysema. Unchanged post treatment appearance of the dependent right lung with very extensive consolidation and volume loss as well as heterogeneous internal calcification (series 6, image 74). Numerous bilateral subsolid pulmonary nodules are again seen throughout the lungs, large index nodule in the posterior left upper lobe unchanged measuring 2.4 x 1.4 cm (series 6, image 73). A large nodule in the dependent left lower lobe is however somewhat enlarged, largest axial component measuring 3.0 x 2.4 cm, previously 2.3 x 1.3 cm when measured similarly, growth further evident in comparison to prior examination dated 07/18/2018 (series 6, image 85). No pleural effusion or pneumothorax. Musculoskeletal: No chest wall abnormality. No acute osseous findings. CT ABDOMEN PELVIS FINDINGS Hepatobiliary: No solid liver abnormality is seen. No gallstones, gallbladder wall thickening, or biliary dilatation. Pancreas: Unchanged small cystic lesions of the pancreatic uncinate, not well delineated on this CT examination (series 2, image 66). No pancreatic ductal dilatation or surrounding inflammatory changes. Spleen: Normal in size without significant  abnormality. Adrenals/Urinary Tract: Adrenal glands are unremarkable. Kidneys are normal, without renal calculi, solid lesion, or hydronephrosis. Bladder is unremarkable. Stomach/Bowel: Stomach is within normal limits. Appendix appears normal. No evidence of bowel wall thickening, distention, or inflammatory changes. Vascular/Lymphatic: Aortic atherosclerosis. No enlarged abdominal or pelvic lymph nodes. Reproductive: No mass or other abnormality. Other: No abdominal wall hernia or abnormality. No ascites. Musculoskeletal: No acute osseous findings. IMPRESSION: 1. Unchanged post treatment appearance of the dependent right lung with very extensive post treatment/post radiation consolidation and volume loss. 2. Numerous bilateral subsolid pulmonary nodules are again seen throughout the lungs, the majority unchanged. A large nodule in the dependent left lower lobe is however somewhat enlarged, consistent with worsened multifocal adenocarcinoma or intrapulmonary metastatic disease. 3. No evidence of lymphadenopathy or metastatic disease in the abdomen or pelvis. 4. 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 Attention on follow-up. 5. Coronary artery disease. Aortic Atherosclerosis (ICD10-I70.0)  and Emphysema (ICD10-J43.9). Electronically Signed   By: Jearld Lesch M.D.   On: 10/31/2022 16:17   CT ABDOMEN PELVIS W CONTRAST  Result Date: 10/31/2022 CLINICAL DATA:  Non-small Rubio lung cancer restaging * Tracking Code: BO * EXAM: CT CHEST, ABDOMEN, AND PELVIS WITH CONTRAST TECHNIQUE: Multidetector CT imaging of the chest, abdomen and pelvis was performed following the standard protocol during bolus administration of intravenous contrast. RADIATION DOSE REDUCTION: This exam was performed according to the departmental dose-optimization program  which includes automated exposure control, adjustment of the mA and/or kV according to patient size and/or use of iterative reconstruction technique. CONTRAST:  OMNIPAQUE IOHEXOL 300 MG/ML  SOLN COMPARISON:  CT chest, 08/29/2022, CT chest abdomen pelvis, 07/18/2022 FINDINGS: CT CHEST FINDINGS Cardiovascular: Aortic atherosclerosis. Normal heart size. Three-vessel coronary artery calcifications. No pericardial effusion. Mediastinum/Nodes: No enlarged mediastinal, hilar, or axillary lymph nodes. Thyroid gland, trachea, and esophagus demonstrate no significant findings. Lungs/Pleura: Moderate centrilobular and paraseptal emphysema. Unchanged post treatment appearance of the dependent right lung with very extensive consolidation and volume loss as well as heterogeneous internal calcification (series 6, image 74). Numerous bilateral subsolid pulmonary nodules are again seen throughout the lungs, large index nodule in the posterior left upper lobe unchanged measuring 2.4 x 1.4 cm (series 6, image 73). A large nodule in the dependent left lower lobe is however somewhat enlarged, largest axial component measuring 3.0 x 2.4 cm, previously 2.3 x 1.3 cm when measured similarly, growth further evident in comparison to prior examination dated 07/18/2018 (series 6, image 85). No pleural effusion or pneumothorax. Musculoskeletal: No chest wall abnormality. No acute osseous findings. CT ABDOMEN PELVIS FINDINGS Hepatobiliary: No solid liver abnormality is seen. No gallstones, gallbladder wall thickening, or biliary dilatation. Pancreas: Unchanged small cystic lesions of the pancreatic uncinate, not well delineated on this CT examination (series 2, image 66). No pancreatic ductal dilatation or surrounding inflammatory changes. Spleen: Normal in size without significant abnormality. Adrenals/Urinary Tract: Adrenal glands are unremarkable. Kidneys are normal, without renal calculi, solid lesion, or hydronephrosis. Bladder is  unremarkable. Stomach/Bowel: Stomach is within normal limits. Appendix appears normal. No evidence of bowel wall thickening, distention, or inflammatory changes. Vascular/Lymphatic: Aortic atherosclerosis. No enlarged abdominal or pelvic lymph nodes. Reproductive: No mass or other abnormality. Other: No abdominal wall hernia or abnormality. No ascites. Musculoskeletal: No acute osseous findings. IMPRESSION: 1. Unchanged post treatment appearance of the dependent right lung with very extensive post treatment/post radiation consolidation and volume loss. 2. Numerous bilateral subsolid pulmonary nodules are again seen throughout the lungs, the majority unchanged. A large nodule in the dependent left lower lobe is however somewhat enlarged, consistent with worsened multifocal adenocarcinoma or intrapulmonary metastatic disease. 3. No evidence of lymphadenopathy or metastatic disease in the abdomen or pelvis. 4. 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 Attention on follow-up. 5. Coronary artery disease. Aortic Atherosclerosis (ICD10-I70.0) and Emphysema (ICD10-J43.9). Electronically Signed   By: Jearld Lesch M.D.   On: 10/31/2022 16:17   ECHOCARDIOGRAM COMPLETE  Result Date: 10/06/2022    ECHOCARDIOGRAM REPORT   Patient Name:   Deanthony Dize. Date of Exam: 10/06/2022 Medical Rec #:  [id number]             Height:       72.0 in Accession #:    [id number]  Weight:       185.4 lb Date of Birth:  February 04, 1951              BSA:          2.063 m Patient Age:    72 years              BP:           112/57 mmHg Patient Gender: M                     HR:           73 bpm. Exam Location:  Inpatient Procedure: 2D Echo, Cardiac Doppler, Color Doppler and Intracardiac            Opacification Agent Indications:    Abnormal ECG  History:         Patient has prior history of Echocardiogram examinations, most                 recent 05/11/2021. Previous Myocardial Infarction and CAD, DVT;                 Risk Factors:Hypertension, Dyslipidemia and Diabetes. Lung CA.  Sonographer:    Milda Smart Referring Phys: [id number] ANGELA NICOLE DUKE  Sonographer Comments: Image acquisition challenging due to patient body habitus and Image acquisition challenging due to respiratory motion. IMPRESSIONS  1. Left ventricular ejection fraction, by estimation, is 60 to 65%. The left ventricle has normal function. The left ventricle has no regional wall motion abnormalities. Left ventricular diastolic parameters are indeterminate.  2. Right ventricular systolic function is normal. The right ventricular size is normal.  3. The mitral valve is normal in structure. No evidence of mitral valve regurgitation. No evidence of mitral stenosis.  4. The aortic valve is normal in structure. Aortic valve regurgitation is not visualized. No aortic stenosis is present.  5. There is mild dilatation of the ascending aorta, measuring 40 mm. There is borderline dilatation of the aortic root, measuring 38 mm.  6. The inferior vena cava is normal in size with greater than 50% respiratory variability, suggesting right atrial pressure of 3 mmHg. FINDINGS  Left Ventricle: Left ventricular ejection fraction, by estimation, is 60 to 65%. The left ventricle has normal function. The left ventricle has no regional wall motion abnormalities. The left ventricular internal cavity size was normal in size. There is  no left ventricular hypertrophy. Left ventricular diastolic parameters are indeterminate. Right Ventricle: The right ventricular size is normal. No increase in right ventricular wall thickness. Right ventricular systolic function is normal. Left Atrium: Left atrial size was normal in size. Right Atrium: Right atrial size was normal in size. Pericardium: There is no evidence of pericardial effusion.  Mitral Valve: The mitral valve is normal in structure. No evidence of mitral valve regurgitation. No evidence of mitral valve stenosis. MV peak gradient, 3.2 mmHg. The mean mitral valve gradient is 1.0 mmHg. Tricuspid Valve: The tricuspid valve is normal in structure. Tricuspid valve regurgitation is not demonstrated. No evidence of tricuspid stenosis. Aortic Valve: The aortic valve is normal in structure. Aortic valve regurgitation is not visualized. No aortic stenosis is present. Pulmonic Valve: The pulmonic valve was normal in structure. Pulmonic valve regurgitation is not visualized. No evidence of pulmonic stenosis. Aorta: The aortic root is normal in size and structure. There is mild dilatation of the ascending aorta, measuring 40 mm. There is borderline dilatation of the aortic root, measuring 38 mm. Venous: The  inferior vena cava is normal in size with greater than 50% respiratory variability, suggesting right atrial pressure of 3 mmHg. IAS/Shunts: No atrial level shunt detected by color flow Doppler.  LEFT VENTRICLE PLAX 2D LVIDd:         4.50 cm     Diastology LVIDs:         3.40 cm     LV e' medial:    4.35 cm/s LV PW:         0.90 cm     LV E/e' medial:  9.7 LV IVS:        0.80 cm     LV e' lateral:   7.40 cm/s LVOT diam:     2.30 cm     LV E/e' lateral: 5.7 LV SV:         76 LV SV Index:   37 LVOT Area:     4.15 cm  LV Volumes (MOD) LV vol d, MOD A2C: 65.8 ml LV vol d, MOD A4C: 61.2 ml LV vol s, MOD A2C: 28.9 ml LV vol s, MOD A4C: 26.6 ml LV SV MOD A2C:     36.9 ml LV SV MOD A4C:     61.2 ml LV SV MOD BP:      35.8 ml RIGHT VENTRICLE             IVC RV Basal diam:  3.50 cm     IVC diam: 2.00 cm RV S prime:     10.00 cm/s TAPSE (M-mode): 1.7 cm LEFT ATRIUM             Index        RIGHT ATRIUM           Index LA diam:        3.50 cm 1.70 cm/m   RA Area:     15.40 cm LA Vol (A2C):   44.4 ml 21.52 ml/m  RA Volume:   37.90 ml  18.37 ml/m LA Vol (A4C):   46.5 ml 22.54 ml/m LA Biplane Vol: 45.9 ml 22.25  ml/m  AORTIC VALVE LVOT Vmax:   98.60 cm/s LVOT Vmean:  68.800 cm/s LVOT VTI:    0.184 m  AORTA Ao Root diam: 3.80 cm Ao Asc diam:  4.00 cm MITRAL VALVE               TRICUSPID VALVE MV Area (PHT): 1.87 cm    TR Peak grad:   10.8 mmHg MV Area VTI:   3.22 cm    TR Vmax:        164.00 cm/s MV Peak grad:  3.2 mmHg MV Mean grad:  1.0 mmHg    SHUNTS MV Vmax:       0.89 m/s    Systemic VTI:  0.18 m MV Vmean:      53.3 cm/s   Systemic Diam: 2.30 cm MV Decel Time: 405 msec MV E velocity: 42.20 cm/s MV A velocity: 62.70 cm/s MV E/A ratio:  0.67 Aditya Sabharwal Electronically signed by Dorthula Nettles Signature Date/Time: 10/06/2022/5:27:49 PM    Final     ASSESSMENT AND PLAN: This is a very pleasant 72 years old white male with history of a stage IIIA (T4, N1, M0) non-small Rubio lung cancer, adenocarcinoma presented with right lower lobe lung mass with a right hilar and no mediastinal lymphadenopathy and no evidence of extrathoracic disease.  The patient was diagnosed in October 2022 status post 3 cycles of neoadjuvant systemic chemotherapy with carboplatin, Alimta  and nivolumab but unfortunately has no evidence for significant improvement of his disease and actually has some evidence for progression. The patient underwent a course of concurrent chemoradiation weekly carboplatin for AUC of 2 and paclitaxel 45 Mg/M2.  Status post 4 cycles. The patient had a rough time with the course of concurrent chemoradiation and he was hospitalized several times during this course. Imaging studies after the course of concurrent chemoradiation showed no concerning findings for disease progression. The patient underwent treatment with immunotherapy with Imfinzi 1500 Mg IV every 4 weeks status post 9 cycles.  This treatment was discontinued secondary to disease progression. The patient is currently on systemic chemotherapy with carboplatin, Alimta and Keytruda status post 8 cycles.  Starting from cycle #5 he has been on treatment  with maintenance Alimta and Keytruda every 3 weeks. He has been tolerating his treatment well but the last cycle he has significant fatigue and weakness as well as nausea, vomiting and diarrhea that lasted for more than 2 weeks. He had repeat CT scan of the chest, abdomen and pelvis performed recently.  I personally and independently reviewed the scan images and discussed the result with the patient today. His scan showed stable disease except for a slightly increased nodule in the dependent left lower lobe. I recommended for the patient to continue his current treatment with the same regimen but he would like to delay the start of cycle number 9 x 3 weeks to recover from the adverse effect of the last treatment. I will arrange for him to come back for follow-up visit in 3 weeks for evaluation before resuming his treatment. We Will continue to monitor him closely and I encouraged him to increase his p.o. intake and to keep taking his prednisone 7.5 mg p.o. daily as recommended by his endocrinologist. The patient was advised to call immediately if he has any other concerning symptoms in the interval. The patient voices understanding of current disease status and treatment options and is in agreement with the current care plan.  All questions were answered. The patient knows to call the clinic with any problems, questions or concerns. We can certainly see the patient much sooner if necessary.   Disclaimer: This note was dictated with voice recognition software. Similar sounding words can inadvertently be transcribed and may not be corrected upon review.

## 2022-11-04 LAB — T4: T4, Total: 10.8 ug/dL (ref 4.5–12.0)

## 2022-11-20 NOTE — Progress Notes (Signed)
[facility] OFFICE PROGRESS NOTE  [name], MD [address] [place] [address]  DIAGNOSIS: Stage IIIA (T4, N0, M0) non-small cell lung cancer, favoring adenocarcinoma presented with large right lower lobe lung mass with no mediastinal lymphadenopathy or extrathoracic metastasis diagnosed in October 2022. Had disease progression with enlarging mass and new right hilar and mediastinal lymph nodes in December 2022.    PRIOR THERAPY:  1) Neoadjuvant treatment with carboplatin for AUC of 5, Alimta 500 Mg/M2 and nivolumab 360 Mg IV every 3 weeks.  First dose January 23, 2021.  Status post 3 cycles. 2) Concurrent chemoradiation with carboplatin for an AUC of 2 and paclitaxel 45 mg per metered squared.  First dose expected on 04/01/2021.  Status post 4 cycles 3) Consolidation treatment with immunotherapy with Imfinzi 1500 Mg IV every 4 weeks.  First dose on Jul 08, 2021.  Status post 9 cycles.  CURRENT THERAPY:  Palliative systemic chemotherapy with carboplatin for an AUC of 5 Alimta 500 mg/m, Keytruda 200 mg IV every 3 weeks. First dose expected on 05/13/2022. Status post 8 cycles.  Starting him cycle #3 his dose of carboplatin will be reduced to AUC of 4 and Alimta 400 Mg/M2 secondary to intolerance. Starting from cycle #5, he will start maintenance treatment with Alimta 400 mg/m2 and Keytruda 200 mg IV every 3 weeks.   INTERVAL HISTORY: Brandon Rubio. 72 y.o. male returns to the clinic today for a follow-up visit.  The patient was last seen by Dr. Arbutus Ped on 11/03/2022.  The patient is feeling fairly well today except for persistent fatigue.  He had significant fatigue and weakness and diarrhea lasting for 17 days after cycle #8.  He had a restaging CT scan that was reviewed at his last appointment that showed slight increase nodule noted in the dependent left lower lobe.  Dr. Arbutus Ped recommended that he continue on the same treatment.  The patient was agreeable to  start treatment but opted to delay cycle #9 by 3 weeks to recover from the adverse side effects of treatment from his prior treatment.  Dr. Arbutus Ped also start him on 7.5 mg of prednisone daily as recommended by his endocrinologist.   Overall, the patient states he is feeling "pretty good" today.  The fatigue is still present but better than it was when he was last seen.  The patient states he does not have energy like he used to.  The patient mentioned that he had a hospitalization a few weeks ago and was told it was secondary to missing his steroids.  He does not use a pillbox for his medications.  Denies any fever, chills, or night sweats.  He continues to have baseline dyspnea on is the same. Denies any major cough except he may have mild cough which is worsening in the morning secondary to postnasal drainage. He does not take anything for this because he does not want to make any more medications than what he is currently on. Denies any chest pain or hemoptysis.  Denies any nausea, vomiting, diarrhea, or constipation.  He was previously having constipation but this resolved with MiraLAX. Denies any headache or visual changes.  Denies any rashes or skin changes.  He is here today for evaluation repeat blood work before considering starting cycle #9.     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  MEDICATIONS:  Current Outpatient Medications  Medication Sig Dispense Refill   aspirin EC 81 MG tablet Take 81  mg by mouth in the morning. Swallow whole.     bisacodyl (DULCOLAX) 5 MG EC tablet Take 5 mg by mouth daily as needed for moderate constipation.     folic acid (FOLVITE) 1 MG tablet TAKE 1 TABLET BY MOUTH EVERY DAY 30 tablet 2   HYDROcodone-acetaminophen (NORCO) 10-325 MG tablet Take 1 tablet by mouth every 12 (twelve) hours as needed. 60 tablet 0   Magnesium Gluconate 550 MG TABS Take 1 tablet (550 mg total) by mouth 2 (two) times daily. 60 tablet 3   metFORMIN (GLUCOPHAGE) 1000 MG tablet TAKE 1 TABLET(1000 MG) BY MOUTH TWICE DAILY WITH A MEAL (Patient taking differently: Take 1,000 mg by mouth 2 (two) times daily with a meal.) 180 tablet 1   metoprolol tartrate (LOPRESSOR) 25 MG tablet Take 1 tablet (25 mg total) by mouth 2 (two) times daily. 60 tablet 3   Multiple Vitamins-Minerals (CENTRUM SILVER 50+MEN) TABS Take 1 tablet by mouth daily with breakfast.     omeprazole (PRILOSEC) 20 MG capsule TAKE 1 CAPSULE BY MOUTH TWICE DAILY BEFORE A MEAL (Patient taking differently: Take 20 mg by mouth 2 (two) times daily before a meal.) 180 capsule 1   ondansetron (ZOFRAN) 8 MG tablet Take 1 tablet (8 mg total) by mouth every 8 (eight) hours as needed for nausea or vomiting. 30 tablet 2   predniSONE (DELTASONE) 5 MG tablet Take 1-1.5 tablets (5-7.5 mg total) by mouth daily with breakfast. (Patient taking differently: Take 7.5 mg by mouth daily with breakfast.) 135 tablet 3   prochlorperazine (COMPAZINE) 10 MG tablet TAKE 1 TABLET(10 MG) BY MOUTH EVERY 6 HOURS FOR UP TO 14 DAYS AS NEEDED FOR NAUSEA OR VOMITING 60 tablet 1   simvastatin (ZOCOR) 40 MG tablet TAKE 1 TABLET BY MOUTH EVERY EVENING AT 6 PM. (Patient taking differently: Take 40 mg by mouth in the morning.) 90 tablet 3   No current facility-administered medications for this visit.    SURGICAL HISTORY:  Past Surgical History:  Procedure Laterality Date   BRONCHIAL BIOPSY  12/10/2020   Procedure: BRONCHIAL BIOPSIES;  Surgeon: Leslye Peer, MD;   Location: [facility] ENDOSCOPY;  Service: Pulmonary;;   BRONCHIAL BRUSHINGS  12/10/2020   Procedure: BRONCHIAL BRUSHINGS;  Surgeon: Leslye Peer, MD;  Location: [facility] ENDOSCOPY;  Service: Pulmonary;;   BRONCHIAL NEEDLE ASPIRATION BIOPSY  12/10/2020   Procedure: BRONCHIAL NEEDLE ASPIRATION BIOPSIES;  Surgeon: Leslye Peer, MD;  Location: [facility] ENDOSCOPY;  Service: Pulmonary;;   COLONOSCOPY     COLONOSCOPY W/ POLYPECTOMY  03/10/2009   LARYNGOSCOPY  03/10/1997   VIDEO BRONCHOSCOPY WITH ENDOBRONCHIAL NAVIGATION N/A 12/10/2020   Procedure: ROBOTIC VIDEO BRONCHOSCOPY WITH ENDOBRONCHIAL NAVIGATION;  Surgeon: Leslye Peer, MD;  Location: [facility] ENDOSCOPY;  Service: Pulmonary;  Laterality: N/A;    REVIEW OF SYSTEMS:   Review of Systems  Constitutional: Positive for fatigue. Negative for appetite change, chills, fever and unexpected weight change.  HENT:  Negative for mouth sores, nosebleeds, sore throat and trouble swallowing.   Eyes: Negative for eye problems and icterus.  Respiratory: Positive for baseline dyspnea on exertion and occasional cough negative for  hemoptysis and wheezing.   Cardiovascular: Negative for chest pain and leg swelling.  Gastrointestinal: Negative for abdominal pain, constipation, diarrhea, nausea and vomiting.  Genitourinary: Negative for bladder incontinence, difficulty urinating, dysuria, frequency and hematuria.   Musculoskeletal: Negative for back pain, gait problem, neck pain and neck stiffness.  Skin: Negative for itching and rash.  Neurological: Negative for dizziness, extremity weakness, gait problem, headaches, light-headedness and seizures.  Hematological: Negative for adenopathy. Does not bruise/bleed easily.  Psychiatric/Behavioral: Negative for confusion, depression and sleep disturbance. The patient is not nervous/anxious.     PHYSICAL EXAMINATION:  Blood pressure 126/81, pulse 82, temperature 97.7 F (36.5 C), temperature source Temporal, resp. rate 18, height  6' (1.829 m), weight 182 lb 5 oz (82.7 kg), SpO2 95%.  ECOG PERFORMANCE STATUS: 1  Physical Exam  Constitutional: Oriented to person, place, and time and well-developed, well-nourished, and in no distress.  HENT:  Head: Normocephalic and atraumatic.  Mouth/Throat: Oropharynx is clear and moist. No oropharyngeal exudate.  Eyes: Conjunctivae are normal. Right eye exhibits no discharge. Left eye exhibits no discharge. No scleral icterus.  Neck: Normal range of motion. Neck supple.  Cardiovascular: Normal rate, regular rhythm, normal heart sounds and intact distal pulses.   Pulmonary/Chest: Effort normal and breath sounds normal. No respiratory distress. No wheezes. No rales.  Abdominal: Soft. Bowel sounds are normal. Exhibits no distension and no mass. There is no tenderness.  Musculoskeletal: Normal range of motion. Exhibits no edema.  Lymphadenopathy:    No cervical adenopathy.  Neurological: Alert and oriented to person, place, and time. Exhibits muscle wasting.  Gait normal. Coordination normal.  Skin: Skin is warm and dry. No rash noted. Not diaphoretic. No erythema. No pallor.  Psychiatric: Mood, memory and judgment normal.  Vitals reviewed.  LABORATORY DATA: Lab Results  Component Value Date   WBC 7.3 11/24/2022   HGB 11.5 (L) 11/24/2022   HCT 32.5 (L) 11/24/2022   MCV 93.7 11/24/2022   PLT 186 11/24/2022      Chemistry      Component Value Date/Time   NA 140 11/03/2022 0948   NA 137 10/24/2022 1540   K 3.5 11/03/2022 0948   CL 106 11/03/2022 0948   CO2 23 11/03/2022 0948   BUN 8 11/03/2022 0948   BUN 9 10/24/2022 1540   CREATININE 0.97 11/03/2022 0948      Component Value Date/Time   CALCIUM 7.6 (L) 11/03/2022 0948   ALKPHOS 53 11/03/2022 0948   AST 26 11/03/2022 0948   ALT 19 11/03/2022 0948   BILITOT 0.4 11/03/2022 0948       RADIOGRAPHIC STUDIES:  CT Chest W Contrast  Result Date: 10/31/2022 CLINICAL DATA:  Non-small cell lung cancer restaging *  Tracking Code: BO * EXAM: CT CHEST, ABDOMEN, AND PELVIS WITH CONTRAST TECHNIQUE: Multidetector CT imaging of the chest, abdomen and pelvis was performed following the standard protocol during bolus administration of intravenous contrast. RADIATION DOSE REDUCTION: This exam was performed according to the departmental dose-optimization program which includes automated exposure control, adjustment of the mA and/or kV according to patient size and/or use of iterative reconstruction technique. CONTRAST:  OMNIPAQUE IOHEXOL 300 MG/ML  SOLN COMPARISON:  CT chest, 08/29/2022, CT chest abdomen pelvis, 07/18/2022 FINDINGS: CT CHEST FINDINGS Cardiovascular: Aortic atherosclerosis. Normal heart size. Three-vessel coronary artery  calcifications. No pericardial effusion. Mediastinum/Nodes: No enlarged mediastinal, hilar, or axillary lymph nodes. Thyroid gland, trachea, and esophagus demonstrate no significant findings. Lungs/Pleura: Moderate centrilobular and paraseptal emphysema. Unchanged post treatment appearance of the dependent right lung with very extensive consolidation and volume loss as well as heterogeneous internal calcification (series 6, image 74). Numerous bilateral subsolid pulmonary nodules are again seen throughout the lungs, large index nodule in the posterior left upper lobe unchanged measuring 2.4 x 1.4 cm (series 6, image 73). A large nodule in the dependent left lower lobe is however somewhat enlarged, largest axial component measuring 3.0 x 2.4 cm, previously 2.3 x 1.3 cm when measured similarly, growth further evident in comparison to prior examination dated 07/18/2018 (series 6, image 85). No pleural effusion or pneumothorax. Musculoskeletal: No chest wall abnormality. No acute osseous findings. CT ABDOMEN PELVIS FINDINGS Hepatobiliary: No solid liver abnormality is seen. No gallstones, gallbladder wall thickening, or biliary dilatation. Pancreas: Unchanged small cystic lesions of the pancreatic  uncinate, not well delineated on this CT examination (series 2, image 66). No pancreatic ductal dilatation or surrounding inflammatory changes. Spleen: Normal in size without significant abnormality. Adrenals/Urinary Tract: Adrenal glands are unremarkable. Kidneys are normal, without renal calculi, solid lesion, or hydronephrosis. Bladder is unremarkable. Stomach/Bowel: Stomach is within normal limits. Appendix appears normal. No evidence of bowel wall thickening, distention, or inflammatory changes. Vascular/Lymphatic: Aortic atherosclerosis. No enlarged abdominal or pelvic lymph nodes. Reproductive: No mass or other abnormality. Other: No abdominal wall hernia or abnormality. No ascites. Musculoskeletal: No acute osseous findings. IMPRESSION: 1. Unchanged post treatment appearance of the dependent right lung with very extensive post treatment/post radiation consolidation and volume loss. 2. Numerous bilateral subsolid pulmonary nodules are again seen throughout the lungs, the majority unchanged. A large nodule in the dependent left lower lobe is however somewhat enlarged, consistent with worsened multifocal adenocarcinoma or intrapulmonary metastatic disease. 3. No evidence of lymphadenopathy or metastatic disease in the abdomen or pelvis. 4. 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 Attention on follow-up. 5. Coronary artery disease. Aortic Atherosclerosis (ICD10-I70.0) and Emphysema (ICD10-J43.9). Electronically Signed   By: Jearld Lesch M.D.   On: 10/31/2022 16:17   CT ABDOMEN PELVIS W CONTRAST  Result Date: 10/31/2022 CLINICAL DATA:  Non-small cell lung cancer restaging * Tracking Code: BO * EXAM: CT CHEST, ABDOMEN, AND PELVIS WITH CONTRAST TECHNIQUE: Multidetector CT imaging of the chest, abdomen and pelvis was performed  following the standard protocol during bolus administration of intravenous contrast. RADIATION DOSE REDUCTION: This exam was performed according to the departmental dose-optimization program which includes automated exposure control, adjustment of the mA and/or kV according to patient size and/or use of iterative reconstruction technique. CONTRAST:  OMNIPAQUE IOHEXOL 300 MG/ML  SOLN COMPARISON:  CT chest, 08/29/2022, CT chest abdomen pelvis, 07/18/2022 FINDINGS: CT CHEST FINDINGS Cardiovascular: Aortic atherosclerosis. Normal heart size. Three-vessel coronary artery calcifications. No pericardial effusion. Mediastinum/Nodes: No enlarged mediastinal, hilar, or axillary lymph nodes. Thyroid gland, trachea, and esophagus demonstrate no significant findings. Lungs/Pleura: Moderate centrilobular and paraseptal emphysema. Unchanged post treatment appearance of the dependent right lung with very extensive consolidation and volume loss as well as heterogeneous internal calcification (series 6, image 74). Numerous bilateral subsolid pulmonary nodules are again seen throughout the lungs, large index nodule in the posterior left upper lobe unchanged measuring 2.4 x 1.4 cm (series 6, image 73).  A large nodule in the dependent left lower lobe is however somewhat enlarged, largest axial component measuring 3.0 x 2.4 cm, previously 2.3 x 1.3 cm when measured similarly, growth further evident in comparison to prior examination dated 07/18/2018 (series 6, image 85). No pleural effusion or pneumothorax. Musculoskeletal: No chest wall abnormality. No acute osseous findings. CT ABDOMEN PELVIS FINDINGS Hepatobiliary: No solid liver abnormality is seen. No gallstones, gallbladder wall thickening, or biliary dilatation. Pancreas: Unchanged small cystic lesions of the pancreatic uncinate, not well delineated on this CT examination (series 2, image 66). No pancreatic ductal dilatation or surrounding inflammatory changes. Spleen:  Normal in size without significant abnormality. Adrenals/Urinary Tract: Adrenal glands are unremarkable. Kidneys are normal, without renal calculi, solid lesion, or hydronephrosis. Bladder is unremarkable. Stomach/Bowel: Stomach is within normal limits. Appendix appears normal. No evidence of bowel wall thickening, distention, or inflammatory changes. Vascular/Lymphatic: Aortic atherosclerosis. No enlarged abdominal or pelvic lymph nodes. Reproductive: No mass or other abnormality. Other: No abdominal wall hernia or abnormality. No ascites. Musculoskeletal: No acute osseous findings. IMPRESSION: 1. Unchanged post treatment appearance of the dependent right lung with very extensive post treatment/post radiation consolidation and volume loss. 2. Numerous bilateral subsolid pulmonary nodules are again seen throughout the lungs, the majority unchanged. A large nodule in the dependent left lower lobe is however somewhat enlarged, consistent with worsened multifocal adenocarcinoma or intrapulmonary metastatic disease. 3. No evidence of lymphadenopathy or metastatic disease in the abdomen or pelvis. 4. 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 Attention on follow-up. 5. Coronary artery disease. Aortic Atherosclerosis (ICD10-I70.0) and Emphysema (ICD10-J43.9). Electronically Signed   By: Jearld Lesch M.D.   On: 10/31/2022 16:17     ASSESSMENT/PLAN:  This is a very pleasant 72 year old Caucasian male with recurrent and metastatic non-small cell lung cancer, adenocarcinoma.  He was initially diagnosed as a stage IIIa (T4, N1, M0).  He presented with a right upper lobe mass and right hilar and mediastinal lymphadenopathy.  He was initially diagnosed in October 2022.  The patient developed metastatic disease with bilateral pulmonary nodules in  January 2024. He does not have any actionable mutations by foundation one.    The patient initially underwent 3 cycles of neoadjuvant systemic chemotherapy with carboplatin, Alimta, nivolumab.  Unfortunately the patient did not have significant improvement in his disease and actually developed disease progression.   He then underwent a course of concurrent chemoradiation.   He is currently on consolidation immunotherapy with Imfinzi 1500 mg IV every 4 weeks and is status post 9 cycles.  Therefore, he was started on systemic chemotherapy with carboplatin for an AUC of 5, Alimta 500 mg/m, and Keytruda 200 mg IV every 3 weeks. He underwent his first dose of treatment on 05/13/22.  He is status post 8 cycles.  His dose of carboplatin was reduced to an AUC of 5 and Alimta to 400 mg/m2 starting from cycle #3 due to intolerance.  He started maintenance treatment with dose reduced Alimta 400 mg/m and Keytruda 200 mg IV every 3 weeks starting from cycle #5.  Labs were reviewed.  Recommend that he proceed with cycle #9 today scheduled.  We will see him back for follow-up visit in 3 weeks for evaluation repeat blood work before undergoing cycle #10.  Will continue to take 7.5 mg of prednisone for his adrenal insufficiency as recommended by his  endocrinologist.  I encouraged the patient to purchase a pillbox and to this up on a weekly basis.  This is a better way to monitor to make sure he is not missing any doses of his medications.  We also discussed the importance of some level of activity to help with fatigue and deconditioning.  I encouraged him to purchase foot pedals for some activity when he is sedentary.   The patient was advised to call immediately if he has any concerning symptoms in the interval. The patient voices understanding of current disease status and treatment options and is in agreement with the current care plan. All questions were answered. The patient knows to call the clinic with any  problems, questions or concerns. We can certainly see the patient much sooner if necessary         No orders of the defined types were placed in this encounter.    The total time spent in the appointment was 20-29 minutes  Journiee Feldkamp L Claude Swendsen, PA-C 11/24/22

## 2022-11-24 ENCOUNTER — Inpatient Hospital Stay (HOSPITAL_BASED_OUTPATIENT_CLINIC_OR_DEPARTMENT_OTHER): Payer: Medicare Other | Admitting: Physician Assistant

## 2022-11-24 ENCOUNTER — Inpatient Hospital Stay: Payer: Medicare Other | Attending: Internal Medicine

## 2022-11-24 ENCOUNTER — Inpatient Hospital Stay: Payer: Medicare Other

## 2022-11-24 VITALS — BP 126/81 | HR 82 | Temp 97.7°F | Resp 18 | Ht 72.0 in | Wt 182.3 lb

## 2022-11-24 DIAGNOSIS — Z5111 Encounter for antineoplastic chemotherapy: Secondary | ICD-10-CM | POA: Diagnosis not present

## 2022-11-24 DIAGNOSIS — C3431 Malignant neoplasm of lower lobe, right bronchus or lung: Secondary | ICD-10-CM | POA: Diagnosis not present

## 2022-11-24 DIAGNOSIS — C3491 Malignant neoplasm of unspecified part of right bronchus or lung: Secondary | ICD-10-CM

## 2022-11-24 DIAGNOSIS — Z79899 Other long term (current) drug therapy: Secondary | ICD-10-CM | POA: Diagnosis not present

## 2022-11-24 DIAGNOSIS — C7800 Secondary malignant neoplasm of unspecified lung: Secondary | ICD-10-CM | POA: Insufficient documentation

## 2022-11-24 DIAGNOSIS — Z5112 Encounter for antineoplastic immunotherapy: Secondary | ICD-10-CM | POA: Insufficient documentation

## 2022-11-24 DIAGNOSIS — Z923 Personal history of irradiation: Secondary | ICD-10-CM | POA: Diagnosis not present

## 2022-11-24 LAB — CBC WITH DIFFERENTIAL (CANCER CENTER ONLY)
Abs Immature Granulocytes: 0.02 10*3/uL (ref 0.00–0.07)
Basophils Absolute: 0.1 10*3/uL (ref 0.0–0.1)
Basophils Relative: 1 %
Eosinophils Absolute: 0.3 10*3/uL (ref 0.0–0.5)
Eosinophils Relative: 4 %
HCT: 32.5 % — ABNORMAL LOW (ref 39.0–52.0)
Hemoglobin: 11.5 g/dL — ABNORMAL LOW (ref 13.0–17.0)
Immature Granulocytes: 0 %
Lymphocytes Relative: 25 %
Lymphs Abs: 1.8 10*3/uL (ref 0.7–4.0)
MCH: 33.1 pg (ref 26.0–34.0)
MCHC: 35.4 g/dL (ref 30.0–36.0)
MCV: 93.7 fL (ref 80.0–100.0)
Monocytes Absolute: 0.6 10*3/uL (ref 0.1–1.0)
Monocytes Relative: 8 %
Neutro Abs: 4.5 10*3/uL (ref 1.7–7.7)
Neutrophils Relative %: 62 %
Platelet Count: 186 10*3/uL (ref 150–400)
RBC: 3.47 MIL/uL — ABNORMAL LOW (ref 4.22–5.81)
RDW: 13.7 % (ref 11.5–15.5)
WBC Count: 7.3 10*3/uL (ref 4.0–10.5)
nRBC: 0 % (ref 0.0–0.2)

## 2022-11-24 LAB — CMP (CANCER CENTER ONLY)
ALT: 11 U/L (ref 0–44)
AST: 17 U/L (ref 15–41)
Albumin: 3.9 g/dL (ref 3.5–5.0)
Alkaline Phosphatase: 54 U/L (ref 38–126)
Anion gap: 8 (ref 5–15)
BUN: 9 mg/dL (ref 8–23)
CO2: 27 mmol/L (ref 22–32)
Calcium: 9.3 mg/dL (ref 8.9–10.3)
Chloride: 104 mmol/L (ref 98–111)
Creatinine: 0.95 mg/dL (ref 0.61–1.24)
GFR, Estimated: >60 mL/min (ref >60)
Glucose, Bld: 113 mg/dL — ABNORMAL HIGH (ref 70–99)
Potassium: 3.6 mmol/L (ref 3.5–5.1)
Sodium: 139 mmol/L (ref 135–145)
Total Bilirubin: 0.4 mg/dL (ref 0.3–1.2)
Total Protein: 7.3 g/dL (ref 6.5–8.1)

## 2022-11-24 LAB — TSH: TSH: 1.614 u[IU]/mL (ref 0.350–4.500)

## 2022-11-24 MED ORDER — SODIUM CHLORIDE 0.9 % IV SOLN
200.0000 mg | Freq: Once | INTRAVENOUS | Status: AC
  Administered 2022-11-24: 200 mg via INTRAVENOUS
  Filled 2022-11-24: qty 200

## 2022-11-24 MED ORDER — SODIUM CHLORIDE 0.9 % IV SOLN
400.0000 mg/m2 | Freq: Once | INTRAVENOUS | Status: AC
  Administered 2022-11-24: 900 mg via INTRAVENOUS
  Filled 2022-11-24: qty 20

## 2022-11-24 MED ORDER — CYANOCOBALAMIN 1000 MCG/ML IJ SOLN
1000.0000 ug | Freq: Once | INTRAMUSCULAR | Status: AC
  Administered 2022-11-24: 1000 ug via INTRAMUSCULAR
  Filled 2022-11-24: qty 1

## 2022-11-24 MED ORDER — PALONOSETRON HCL INJECTION 0.25 MG/5ML
0.2500 mg | Freq: Once | INTRAVENOUS | Status: AC
  Administered 2022-11-24: 0.25 mg via INTRAVENOUS
  Filled 2022-11-24: qty 5

## 2022-11-24 MED ORDER — SODIUM CHLORIDE 0.9 % IV SOLN: Freq: Once | INTRAVENOUS | Status: AC

## 2022-11-25 LAB — T4: T4, Total: 10.2 ug/dL (ref 4.5–12.0)

## 2022-12-03 ENCOUNTER — Other Ambulatory Visit: Payer: Self-pay | Admitting: Physician Assistant

## 2022-12-03 DIAGNOSIS — R112 Nausea with vomiting, unspecified: Secondary | ICD-10-CM

## 2022-12-08 ENCOUNTER — Telehealth: Payer: Self-pay | Admitting: Internal Medicine

## 2022-12-08 MED ORDER — HYDROCODONE-ACETAMINOPHEN 10-325 MG PO TABS: 1.0000 | ORAL_TABLET | Freq: Two times a day (BID) | ORAL | 0 refills | Status: DC | PRN

## 2022-12-08 NOTE — Telephone Encounter (Signed)
Prescription Request  12/08/2022  LOV: 10/31/2022  What is the name of the medication or equipment? HYDROcodone-acetaminophen (NORCO) 10-325 MG tablet   Have you contacted your pharmacy to request a refill? No   Which pharmacy would you like this sent to?  [id number] Gin[place] Otto,  - [address] AT [facility] [address] [facility] [address] [place] [address] Phone: [phone] Fax: [phone]    Patient notified that their request is being sent to the clinical staff for review and that they should receive a response within 2 business days.   Please advise at [facility] [phone]

## 2022-12-08 NOTE — Telephone Encounter (Signed)
Refilled

## 2022-12-12 ENCOUNTER — Telehealth: Payer: Self-pay

## 2022-12-12 NOTE — Telephone Encounter (Signed)
Patient left VM stating pharmacy needed an "approval" RN called pharmacy and rep made RN aware they missed seeing that the refills were already present. Pharmacy yo call patient ac to fill RX. Nothing else needed from office

## 2022-12-15 ENCOUNTER — Inpatient Hospital Stay (HOSPITAL_BASED_OUTPATIENT_CLINIC_OR_DEPARTMENT_OTHER): Payer: Medicare Other | Admitting: Internal Medicine

## 2022-12-15 ENCOUNTER — Inpatient Hospital Stay: Payer: Medicare Other | Attending: Internal Medicine

## 2022-12-15 ENCOUNTER — Inpatient Hospital Stay: Payer: Medicare Other

## 2022-12-15 VITALS — BP 116/59 | HR 65 | Resp 16

## 2022-12-15 VITALS — BP 124/71 | HR 65 | Temp 98.2°F | Resp 17 | Ht 72.0 in | Wt 182.4 lb

## 2022-12-15 DIAGNOSIS — Z23 Encounter for immunization: Secondary | ICD-10-CM | POA: Insufficient documentation

## 2022-12-15 DIAGNOSIS — Z5111 Encounter for antineoplastic chemotherapy: Secondary | ICD-10-CM | POA: Diagnosis present

## 2022-12-15 DIAGNOSIS — C3491 Malignant neoplasm of unspecified part of right bronchus or lung: Secondary | ICD-10-CM

## 2022-12-15 DIAGNOSIS — D649 Anemia, unspecified: Secondary | ICD-10-CM | POA: Diagnosis not present

## 2022-12-15 DIAGNOSIS — Z5112 Encounter for antineoplastic immunotherapy: Secondary | ICD-10-CM | POA: Insufficient documentation

## 2022-12-15 DIAGNOSIS — C3431 Malignant neoplasm of lower lobe, right bronchus or lung: Secondary | ICD-10-CM | POA: Insufficient documentation

## 2022-12-15 DIAGNOSIS — Z79899 Other long term (current) drug therapy: Secondary | ICD-10-CM | POA: Insufficient documentation

## 2022-12-15 LAB — CBC WITH DIFFERENTIAL (CANCER CENTER ONLY)
Abs Immature Granulocytes: 0.03 10*3/uL (ref 0.00–0.07)
Basophils Absolute: 0.1 10*3/uL (ref 0.0–0.1)
Basophils Relative: 1 %
Eosinophils Absolute: 0.3 10*3/uL (ref 0.0–0.5)
Eosinophils Relative: 5 %
HCT: 31.8 % — ABNORMAL LOW (ref 39.0–52.0)
Hemoglobin: 10.7 g/dL — ABNORMAL LOW (ref 13.0–17.0)
Immature Granulocytes: 0 %
Lymphocytes Relative: 25 %
Lymphs Abs: 1.7 10*3/uL (ref 0.7–4.0)
MCH: 32.1 pg (ref 26.0–34.0)
MCHC: 33.6 g/dL (ref 30.0–36.0)
MCV: 95.5 fL (ref 80.0–100.0)
Monocytes Absolute: 0.6 10*3/uL (ref 0.1–1.0)
Monocytes Relative: 9 %
Neutro Abs: 4 10*3/uL (ref 1.7–7.7)
Neutrophils Relative %: 60 %
Platelet Count: 182 10*3/uL (ref 150–400)
RBC: 3.33 MIL/uL — ABNORMAL LOW (ref 4.22–5.81)
RDW: 13.7 % (ref 11.5–15.5)
WBC Count: 6.7 10*3/uL (ref 4.0–10.5)
nRBC: 0 % (ref 0.0–0.2)

## 2022-12-15 LAB — CMP (CANCER CENTER ONLY)
ALT: 11 U/L (ref 0–44)
AST: 19 U/L (ref 15–41)
Albumin: 3.8 g/dL (ref 3.5–5.0)
Alkaline Phosphatase: 49 U/L (ref 38–126)
Anion gap: 9 (ref 5–15)
BUN: 12 mg/dL (ref 8–23)
CO2: 26 mmol/L (ref 22–32)
Calcium: 8.7 mg/dL — ABNORMAL LOW (ref 8.9–10.3)
Chloride: 105 mmol/L (ref 98–111)
Creatinine: 0.98 mg/dL (ref 0.61–1.24)
GFR, Estimated: >60 mL/min (ref >60)
Glucose, Bld: 118 mg/dL — ABNORMAL HIGH (ref 70–99)
Potassium: 3.5 mmol/L (ref 3.5–5.1)
Sodium: 140 mmol/L (ref 135–145)
Total Bilirubin: 0.4 mg/dL (ref 0.3–1.2)
Total Protein: 6.9 g/dL (ref 6.5–8.1)

## 2022-12-15 MED ORDER — SODIUM CHLORIDE 0.9 % IV SOLN
400.0000 mg/m2 | Freq: Once | INTRAVENOUS | Status: AC
  Administered 2022-12-15: 900 mg via INTRAVENOUS
  Filled 2022-12-15: qty 20

## 2022-12-15 MED ORDER — SODIUM CHLORIDE 0.9 % IV SOLN
200.0000 mg | Freq: Once | INTRAVENOUS | Status: AC
  Administered 2022-12-15: 200 mg via INTRAVENOUS
  Filled 2022-12-15: qty 200

## 2022-12-15 MED ORDER — SODIUM CHLORIDE 0.9 % IV SOLN: Freq: Once | INTRAVENOUS | Status: AC

## 2022-12-15 MED ORDER — PALONOSETRON HCL INJECTION 0.25 MG/5ML
0.2500 mg | Freq: Once | INTRAVENOUS | Status: AC
  Administered 2022-12-15: 0.25 mg via INTRAVENOUS
  Filled 2022-12-15: qty 5

## 2022-12-15 NOTE — Patient Instructions (Addendum)
[facility]   Discharge Instructions: Thank you for choosing [facility] to provide your oncology and hematology care.   If you have a lab appointment with the Cancer Center, please go directly to the Cancer Center and check in at the registration area.   Wear comfortable clothing and clothing appropriate for easy access to any Portacath or PICC line.   We strive to give you quality time with your provider. You may need to reschedule your appointment if you arrive late (15 or more minutes).  Arriving late affects you and other patients whose appointments are after yours.  Also, if you miss three or more appointments without notifying the office, you may be dismissed from the clinic at the provider's discretion.      For prescription refill requests, have your pharmacy contact our office and allow 72 hours for refills to be completed.    Today you received the following chemotherapy and/or immunotherapy agents: Pembrolizumab (Keytruda) and Pemetrexed (Alimta)      To help prevent nausea and vomiting after your treatment, we encourage you to take your nausea medication as directed.  BELOW ARE SYMPTOMS THAT SHOULD BE REPORTED IMMEDIATELY: *FEVER GREATER THAN 100.4 F (38 C) OR HIGHER *CHILLS OR SWEATING *NAUSEA AND VOMITING THAT IS NOT CONTROLLED WITH YOUR NAUSEA MEDICATION *UNUSUAL SHORTNESS OF BREATH *UNUSUAL BRUISING OR BLEEDING *URINARY PROBLEMS (pain or burning when urinating, or frequent urination) *BOWEL PROBLEMS (unusual diarrhea, constipation, pain near the anus) TENDERNESS IN MOUTH AND THROAT WITH OR WITHOUT PRESENCE OF ULCERS (sore throat, sores in mouth, or a toothache) UNUSUAL RASH, SWELLING OR PAIN  UNUSUAL VAGINAL DISCHARGE OR ITCHING   Items with * indicate a potential emergency and should be followed up as soon as possible or go to the Emergency Department if any problems should occur.  Please show the CHEMOTHERAPY ALERT  CARD or IMMUNOTHERAPY ALERT CARD at check-in to the Emergency Department and triage nurse.  Should you have questions after your visit or need to cancel or reschedule your appointment, please contact [facility]  Dept: [phone]  and follow the prompts.  Office hours are 8:00 a.m. to 4:30 p.m. Monday - Friday. Please note that voicemails left after 4:00 p.m. may not be returned until the following business day.  We are closed weekends and major holidays. You have access to a nurse at all times for urgent questions. Please call the main number to the clinic Dept: [phone] and follow the prompts.   For any non-urgent questions, you may also contact your provider using MyChart. We now offer e-Visits for anyone 18 and older to request care online for non-urgent symptoms. For details visit [email or web address].   Also download the MyChart app! Go to the app store, search "MyChart", open the app, select [facility], and log in with your MyChart username and password. 

## 2022-12-15 NOTE — Progress Notes (Signed)
[facility] Telephone:(336) [phone]   [phone]  OFFICE PROGRESS NOTE  [name], MD [address] [place] [address]  DIAGNOSIS: Stage IIIA (T4, N0, M0) non-small cell lung cancer, favoring adenocarcinoma presented with large right lower lobe lung mass with no mediastinal lymphadenopathy or extrathoracic metastasis diagnosed in October 2022. Had disease progression with enlarging mass and new right hilar and mediastinal lymph nodes in December 2022.      PRIOR THERAPY:  1) Neoadjuvant treatment with carboplatin for AUC of 5, Alimta 500 Mg/M2 and nivolumab 360 Mg IV every 3 weeks.  First dose January 23, 2021.  Status post 3 cycles. 2) Concurrent chemoradiation with carboplatin for an AUC of 2 and paclitaxel 45 mg per metered squared.  First dose expected on 04/01/2021.  Status post 4 cycles 3) Consolidation treatment with immunotherapy with Imfinzi 1500 Mg IV every 4 weeks.  First dose on Jul 08, 2021.  Status post 9 cycles.    CURRENT THERAPY: Palliative systemic chemotherapy with carboplatin for an AUC of 5, Alimta 500 mg/m, Keytruda 200 mg IV every 3 weeks. First dose on 05/13/2022.  Status post 9 cycles.  Starting him cycle #3 his dose of carboplatin will be reduced to AUC of 4 and Alimta 400 Mg/M2 secondary to intolerance.  INTERVAL HISTORY: Brandon Rubio. 72 y.o. male returns to the clinic today for follow-up visit.Discussed the use of AI scribe software for clinical note transcription with the patient, who gave verbal consent to proceed.  History of Present Illness   Brandon Rubio, a 72 year old patient with a history of metastatic non-small cell lung cancer (adenocarcinoma), initially diagnosed as stage 3A in October 2022, has been receiving maintenance treatment with Alimta and Keytruda. Recently, the dosage of Alimta was reduced, which resulted in a significant improvement in his overall well-being. Initially, he experienced  a period of discomfort, but after about a week, he started feeling better.  The patient reported having diarrhea, which was attributed to the use of a laxative. However, this issue has since resolved. His weight has remained stable at around 182 pounds, and he has not reported any nausea or vomiting. Despite the presence of anemia, his blood work has been satisfactory.  The patient has been maintaining an active lifestyle, including yard work and tree cutting. This level of activity is impressive given his age and medical condition.         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  MEDICATIONS:  Current Outpatient Medications  Medication Sig Dispense Refill   aspirin EC 81 MG tablet Take 81 mg by mouth in the morning. Swallow whole.     bisacodyl (DULCOLAX) 5 MG EC tablet Take 5 mg by mouth daily as needed for moderate constipation.     folic acid (FOLVITE) 1 MG tablet TAKE 1 TABLET BY MOUTH EVERY DAY 30 tablet 2  HYDROcodone-acetaminophen (NORCO) 10-325 MG tablet Take 1 tablet by mouth every 12 (twelve) hours as needed. 60 tablet 0   Magnesium Gluconate 550 MG TABS Take 1 tablet (550 mg total) by mouth 2 (two) times daily. 60 tablet 3   metFORMIN (GLUCOPHAGE) 1000 MG tablet TAKE 1 TABLET(1000 MG) BY MOUTH TWICE DAILY WITH A MEAL (Patient taking differently: Take 1,000 mg by mouth 2 (two) times daily with a meal.) 180 tablet 1   metoprolol tartrate (LOPRESSOR) 25 MG tablet Take 1 tablet (25 mg total)  by mouth 2 (two) times daily. 60 tablet 3   Multiple Vitamins-Minerals (CENTRUM SILVER 50+MEN) TABS Take 1 tablet by mouth daily with breakfast.     omeprazole (PRILOSEC) 20 MG capsule TAKE 1 CAPSULE BY MOUTH TWICE DAILY BEFORE A MEAL (Patient taking differently: Take 20 mg by mouth 2 (two) times daily before a meal.) 180 capsule 1   ondansetron (ZOFRAN) 8 MG tablet Take 1 tablet (8 mg total) by mouth every 8 (eight) hours as needed for nausea or vomiting. 30 tablet 2   predniSONE (DELTASONE) 5 MG tablet Take 1-1.5 tablets (5-7.5 mg total) by mouth daily with breakfast. (Patient taking differently: Take 7.5 mg by mouth daily with breakfast.) 135 tablet 3   prochlorperazine (COMPAZINE) 10 MG tablet TAKE 1 TABLET(10 MG) BY MOUTH EVERY 6 HOURS FOR UP TO 14 DAYS AS NEEDED FOR NAUSEA OR VOMITING 60 tablet 1   simvastatin (ZOCOR) 40 MG tablet TAKE 1 TABLET BY MOUTH EVERY EVENING AT 6 PM. (Patient taking differently: Take 40 mg by mouth in the morning.) 90 tablet 3   No current facility-administered medications for this visit.    SURGICAL HISTORY:  Past Surgical History:  Procedure Laterality Date   BRONCHIAL BIOPSY  12/10/2020   Procedure: BRONCHIAL BIOPSIES;  Surgeon: Leslye Peer, MD;  Location: [facility] ENDOSCOPY;  Service: Pulmonary;;   BRONCHIAL BRUSHINGS  12/10/2020   Procedure: BRONCHIAL BRUSHINGS;  Surgeon: Leslye Peer, MD;  Location: [facility] ENDOSCOPY;  Service: Pulmonary;;   BRONCHIAL NEEDLE ASPIRATION BIOPSY  12/10/2020   Procedure: BRONCHIAL NEEDLE ASPIRATION BIOPSIES;  Surgeon: Leslye Peer, MD;  Location[facility] ENDOSCOPY;  Service: Pulmonary;;   COLONOSCOPY     COLONOSCOPY W/ POLYPECTOMY  03/10/2009   LARYNGOSCOPY  03/10/1997   VIDEO BRONCHOSCOPY WITH ENDOBRONCHIAL NAVIGATION N/A 12/10/2020   Procedure: ROBOTIC VIDEO BRONCHOSCOPY WITH ENDOBRONCHIAL NAVIGATION;  Surgeon: Leslye Peer, MD;  Location: [facility] ENDOSCOPY;  Service: Pulmonary;  Laterality: N/A;    REVIEW OF SYSTEMS:  A  comprehensive review of systems was negative except for: Constitutional: positive for fatigue   PHYSICAL EXAMINATION: General appearance: alert, cooperative, and no distress Head: Normocephalic, without obvious abnormality, atraumatic Neck: no adenopathy, no JVD, supple, symmetrical, trachea midline, and thyroid not enlarged, symmetric, no tenderness/mass/nodules Lymph nodes: Cervical, supraclavicular, and axillary nodes normal. Resp: clear to auscultation bilaterally Back: symmetric, no curvature. ROM normal. No CVA tenderness. Cardio: regular rate and rhythm, S1, S2 normal, no murmur, click, rub or gallop GI: soft, non-tender; bowel sounds normal; no masses,  no organomegaly Extremities: extremities normal, atraumatic, no cyanosis or edema  ECOG PERFORMANCE STATUS: 1 - Symptomatic but completely ambulatory  Blood pressure 124/71, pulse 65, temperature 98.2 F (36.8 C), temperature source Oral, resp. rate 17, height 6' (1.829 m), weight 182 lb 6.4 oz (82.7 kg), SpO2 98%.  LABORATORY DATA: Lab Results  Component Value Date   WBC 6.7 12/15/2022   HGB 10.7 (L) 12/15/2022   HCT 31.8 (L) 12/15/2022  MCV 95.5 12/15/2022   PLT 182 12/15/2022      Chemistry      Component Value Date/Time   NA 139 11/24/2022 1030   NA 137 10/24/2022 1540   K 3.6 11/24/2022 1030   CL 104 11/24/2022 1030   CO2 27 11/24/2022 1030   BUN 9 11/24/2022 1030   BUN 9 10/24/2022 1540   CREATININE 0.95 11/24/2022 1030      Component Value Date/Time   CALCIUM 9.3 11/24/2022 1030   ALKPHOS 54 11/24/2022 1030   AST 17 11/24/2022 1030   ALT 11 11/24/2022 1030   BILITOT 0.4 11/24/2022 1030       RADIOGRAPHIC STUDIES: No results found.  ASSESSMENT AND PLAN: This is a very pleasant 72 years old white male with history of a stage IIIA (T4, N1, M0) non-small cell lung cancer, adenocarcinoma presented with right lower lobe lung mass with a right hilar and no mediastinal lymphadenopathy and no evidence of  extrathoracic disease.  The patient was diagnosed in October 2022 status post 3 cycles of neoadjuvant systemic chemotherapy with carboplatin, Alimta and nivolumab but unfortunately has no evidence for significant improvement of his disease and actually has some evidence for progression. The patient underwent a course of concurrent chemoradiation weekly carboplatin for AUC of 2 and paclitaxel 45 Mg/M2.  Status post 4 cycles. The patient had a rough time with the course of concurrent chemoradiation and he was hospitalized several times during this course. Imaging studies after the course of concurrent chemoradiation showed no concerning findings for disease progression. The patient underwent treatment with immunotherapy with Imfinzi 1500 Mg IV every 4 weeks status post 9 cycles.  This treatment was discontinued secondary to disease progression. The patient is currently on systemic chemotherapy with carboplatin, Alimta and Keytruda status post 9 cycles.  Starting from cycle #5 he has been on treatment with maintenance Alimta and Keytruda every 3 weeks.  The patient has been tolerating this treatment fairly well.    Metastatic Non-Small Cell Lung Cancer (Adenocarcinoma) Patient reports improved symptoms after reduction of Alimta dose. No new symptoms reported. Stable weight and blood pressure. Blood work shows persistent anemia but otherwise unremarkable. -Continue maintenance treatment with Alimta (400mg /m^2) and Keytruda. -Continue to monitor for side effects and response to treatment. -Next treatment scheduled for today, follow-up in three weeks.  Anemia Persistent, likely secondary to chemotherapy. -Monitor closely, no specific intervention at this time.     The patient voices understanding of current disease status and treatment options and is in agreement with the current care plan.  All questions were answered. The patient knows to call the clinic with any problems, questions or concerns. We  can certainly see the patient much sooner if necessary.   Disclaimer: This note was dictated with voice recognition software. Similar sounding words can inadvertently be transcribed and may not be corrected upon review.

## 2022-12-22 ENCOUNTER — Other Ambulatory Visit: Payer: Self-pay | Admitting: Internal Medicine

## 2023-01-04 ENCOUNTER — Other Ambulatory Visit: Payer: Self-pay | Admitting: Internal Medicine

## 2023-01-05 ENCOUNTER — Other Ambulatory Visit: Payer: Self-pay

## 2023-01-05 ENCOUNTER — Inpatient Hospital Stay: Payer: Medicare Other

## 2023-01-05 ENCOUNTER — Other Ambulatory Visit: Payer: Self-pay | Admitting: Medical Oncology

## 2023-01-05 ENCOUNTER — Inpatient Hospital Stay (HOSPITAL_BASED_OUTPATIENT_CLINIC_OR_DEPARTMENT_OTHER): Payer: Medicare Other | Admitting: Internal Medicine

## 2023-01-05 VITALS — BP 104/81 | HR 76 | Temp 97.9°F | Resp 17 | Ht 72.0 in | Wt 184.7 lb

## 2023-01-05 VITALS — BP 112/59 | HR 72 | Temp 97.7°F | Resp 16

## 2023-01-05 DIAGNOSIS — C3491 Malignant neoplasm of unspecified part of right bronchus or lung: Secondary | ICD-10-CM

## 2023-01-05 DIAGNOSIS — Z79899 Other long term (current) drug therapy: Secondary | ICD-10-CM | POA: Diagnosis not present

## 2023-01-05 DIAGNOSIS — D649 Anemia, unspecified: Secondary | ICD-10-CM | POA: Diagnosis not present

## 2023-01-05 DIAGNOSIS — Z5112 Encounter for antineoplastic immunotherapy: Secondary | ICD-10-CM | POA: Diagnosis not present

## 2023-01-05 DIAGNOSIS — C3490 Malignant neoplasm of unspecified part of unspecified bronchus or lung: Secondary | ICD-10-CM

## 2023-01-05 DIAGNOSIS — Z23 Encounter for immunization: Secondary | ICD-10-CM

## 2023-01-05 DIAGNOSIS — C3431 Malignant neoplasm of lower lobe, right bronchus or lung: Secondary | ICD-10-CM | POA: Diagnosis not present

## 2023-01-05 LAB — CMP (CANCER CENTER ONLY)
ALT: 21 U/L (ref 0–44)
AST: 28 U/L (ref 15–41)
Albumin: 3.9 g/dL (ref 3.5–5.0)
Alkaline Phosphatase: 56 U/L (ref 38–126)
Anion gap: 10 (ref 5–15)
BUN: 15 mg/dL (ref 8–23)
CO2: 24 mmol/L (ref 22–32)
Calcium: 9.1 mg/dL (ref 8.9–10.3)
Chloride: 103 mmol/L (ref 98–111)
Creatinine: 1.02 mg/dL (ref 0.61–1.24)
GFR, Estimated: >60 mL/min (ref >60)
Glucose, Bld: 172 mg/dL — ABNORMAL HIGH (ref 70–99)
Potassium: 4 mmol/L (ref 3.5–5.1)
Sodium: 137 mmol/L (ref 135–145)
Total Bilirubin: 0.5 mg/dL (ref 0.3–1.2)
Total Protein: 7.3 g/dL (ref 6.5–8.1)

## 2023-01-05 LAB — CBC WITH DIFFERENTIAL (CANCER CENTER ONLY)
Abs Immature Granulocytes: 0.03 10*3/uL (ref 0.00–0.07)
Basophils Absolute: 0.1 10*3/uL (ref 0.0–0.1)
Basophils Relative: 1 %
Eosinophils Absolute: 0.2 10*3/uL (ref 0.0–0.5)
Eosinophils Relative: 2 %
HCT: 31.6 % — ABNORMAL LOW (ref 39.0–52.0)
Hemoglobin: 10.9 g/dL — ABNORMAL LOW (ref 13.0–17.0)
Immature Granulocytes: 0 %
Lymphocytes Relative: 9 %
Lymphs Abs: 0.8 10*3/uL (ref 0.7–4.0)
MCH: 32.7 pg (ref 26.0–34.0)
MCHC: 34.5 g/dL (ref 30.0–36.0)
MCV: 94.9 fL (ref 80.0–100.0)
Monocytes Absolute: 0.5 10*3/uL (ref 0.1–1.0)
Monocytes Relative: 6 %
Neutro Abs: 6.8 10*3/uL (ref 1.7–7.7)
Neutrophils Relative %: 82 %
Platelet Count: 176 10*3/uL (ref 150–400)
RBC: 3.33 MIL/uL — ABNORMAL LOW (ref 4.22–5.81)
RDW: 14 % (ref 11.5–15.5)
WBC Count: 8.3 10*3/uL (ref 4.0–10.5)
nRBC: 0 % (ref 0.0–0.2)

## 2023-01-05 MED ORDER — PALONOSETRON HCL INJECTION 0.25 MG/5ML
0.2500 mg | Freq: Once | INTRAVENOUS | Status: AC
  Administered 2023-01-05: 0.25 mg via INTRAVENOUS
  Filled 2023-01-05: qty 5

## 2023-01-05 MED ORDER — SODIUM CHLORIDE 0.9 % IV SOLN: Freq: Once | INTRAVENOUS | Status: AC

## 2023-01-05 MED ORDER — SODIUM CHLORIDE 0.9% FLUSH: 10.0000 mL | INTRAVENOUS | Status: DC | PRN

## 2023-01-05 MED ORDER — HEPARIN SOD (PORK) LOCK FLUSH 100 UNIT/ML IV SOLN
500.0000 [IU] | Freq: Once | INTRAVENOUS | Status: DC | PRN
Start: 2023-01-05 — End: 2023-01-05

## 2023-01-05 MED ORDER — INFLUENZA VAC A&B SURF ANT ADJ 0.5 ML IM SUSY
0.5000 mL | PREFILLED_SYRINGE | Freq: Once | INTRAMUSCULAR | Status: AC
  Administered 2023-01-05: 0.5 mL via INTRAMUSCULAR
  Filled 2023-01-05: qty 0.5

## 2023-01-05 MED ORDER — SODIUM CHLORIDE 0.9 % IV SOLN
200.0000 mg | Freq: Once | INTRAVENOUS | Status: AC
Start: 2023-01-05 — End: 2023-01-05
  Administered 2023-01-05: 200 mg via INTRAVENOUS
  Filled 2023-01-05: qty 200

## 2023-01-05 MED ORDER — SODIUM CHLORIDE 0.9 % IV SOLN
400.0000 mg/m2 | Freq: Once | INTRAVENOUS | Status: AC
  Administered 2023-01-05: 900 mg via INTRAVENOUS
  Filled 2023-01-05: qty 20

## 2023-01-05 NOTE — Progress Notes (Signed)
[facility] Telephone:(336) [phone]   Fax:([phone]  OFFICE PROGRESS NOTE  Brandon Broker, MD [address] [place] [address]  DIAGNOSIS: Stage IIIA (T4, N0, M0) non-small cell lung cancer, favoring adenocarcinoma presented with large right lower lobe lung mass with no mediastinal lymphadenopathy or extrathoracic metastasis diagnosed in October 2022. Had disease progression with enlarging mass and new right hilar and mediastinal lymph nodes in December 2022.      PRIOR THERAPY:  1) Neoadjuvant treatment with carboplatin for AUC of 5, Alimta 500 Mg/M2 and nivolumab 360 Mg IV every 3 weeks.  First dose January 23, 2021.  Status post 3 cycles. 2) Concurrent chemoradiation with carboplatin for an AUC of 2 and paclitaxel 45 mg per metered squared.  First dose expected on 04/01/2021.  Status post 4 cycles 3) Consolidation treatment with immunotherapy with Imfinzi 1500 Mg IV every 4 weeks.  First dose on Jul 08, 2021.  Status post 9 cycles.    CURRENT THERAPY: Palliative systemic chemotherapy with carboplatin for an AUC of 5, Alimta 500 mg/m, Keytruda 200 mg IV every 3 weeks. First dose on 05/13/2022.  Status post 10 cycles.  Starting him cycle #3 his dose of carboplatin will be reduced to AUC of 4 and Alimta 400 Mg/M2 secondary to intolerance.  INTERVAL HISTORY: Brandon Rubio. 72 y.o. male returns to the clinic today for follow-up visit. Discussed the use of AI scribe software for clinical note transcription with the patient, who gave verbal consent to proceed.  History of Present Illness   Brandon Rubio, a 72 year old patient diagnosed with non-small cell lung cancer in October 2022, has been undergoing maintenance treatment with Keytruda and Alimta 400 mg/m2 . He has completed ten cycles of treatment so far. Since the last consultation three weeks ago, the patient reports feeling better after the reduction of one of the drugs, Alimta, from his  regimen. He describes the change as making 'all the difference in the world,' indicating a significant improvement in his condition.  The patient also reports mild symptoms such as nausea, vomiting, diarrhea, headaches, changes in vision, chest pain, and shortness of breath, but does not consider these to be severe. He is also on prednisone, alternating between 5mg  and 7.5mg  doses, and has expressed concerns about discrepancies in his medication list.  In addition to his cancer treatment, the patient has expressed a need for a flu shot and a pneumonia shot, indicating an awareness of his overall health needs. He has previously received a pneumonia shot, but is unsure of the exact timing.       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  MEDICATIONS:  Current Outpatient Medications  Medication Sig Dispense Refill   aspirin EC 81 MG tablet Take 81 mg by mouth in the morning. Swallow whole.     bisacodyl (DULCOLAX) 5 MG EC tablet Take 5 mg by  mouth daily as needed for moderate constipation.     folic acid (FOLVITE) 1 MG tablet TAKE 1 TABLET BY MOUTH EVERY DAY 30 tablet 2   HYDROcodone-acetaminophen (NORCO) 10-325 MG tablet Take 1 tablet by mouth every 12 (twelve) hours as needed. 60 tablet 0   Magnesium Gluconate 550 MG TABS Take 1 tablet (550 mg total) by mouth 2 (two) times daily. 60 tablet 3   metFORMIN (GLUCOPHAGE) 1000 MG tablet TAKE 1 TABLET(1000 MG) BY MOUTH TWICE DAILY WITH A MEAL 180 tablet 1    metoprolol tartrate (LOPRESSOR) 25 MG tablet Take 1 tablet (25 mg total) by mouth 2 (two) times daily. 60 tablet 3   Multiple Vitamins-Minerals (CENTRUM SILVER 50+MEN) TABS Take 1 tablet by mouth daily with breakfast.     omeprazole (PRILOSEC) 20 MG capsule TAKE 1 CAPSULE BY MOUTH TWICE DAILY BEFORE A MEAL (Patient taking differently: Take 20 mg by mouth 2 (two) times daily before a meal.) 180 capsule 1   ondansetron (ZOFRAN) 8 MG tablet Take 1 tablet (8 mg total) by mouth every 8 (eight) hours as needed for nausea or vomiting. 30 tablet 2   predniSONE (DELTASONE) 5 MG tablet Take 1-1.5 tablets (5-7.5 mg total) by mouth daily with breakfast. (Patient taking differently: Take 7.5 mg by mouth daily with breakfast.) 135 tablet 3   prochlorperazine (COMPAZINE) 10 MG tablet TAKE 1 TABLET(10 MG) BY MOUTH EVERY 6 HOURS FOR UP TO 14 DAYS AS NEEDED FOR NAUSEA OR VOMITING 60 tablet 1   simvastatin (ZOCOR) 40 MG tablet TAKE 1 TABLET BY MOUTH EVERY EVENING AT 6 PM. (Patient taking differently: Take 40 mg by mouth in the morning.) 90 tablet 3   No current facility-administered medications for this visit.    SURGICAL HISTORY:  Past Surgical History:  Procedure Laterality Date   BRONCHIAL BIOPSY  12/10/2020   Procedure: BRONCHIAL BIOPSIES;  Surgeon: Leslye Peer, MD;  Location: [facility] ENDOSCOPY;  Service: Pulmonary;;   BRONCHIAL BRUSHINGS  12/10/2020   Procedure: BRONCHIAL BRUSHINGS;  Surgeon: Leslye Peer, MD;  Location: [facility] ENDOSCOPY;  Service: Pulmonary;;   BRONCHIAL NEEDLE ASPIRATION BIOPSY  12/10/2020   Procedure: BRONCHIAL NEEDLE ASPIRATION BIOPSIES;  Surgeon: Leslye Peer, MD;  Location: [facility] ENDOSCOPY;  Service: Pulmonary;;   COLONOSCOPY     COLONOSCOPY W/ POLYPECTOMY  03/10/2009   LARYNGOSCOPY  03/10/1997   VIDEO BRONCHOSCOPY WITH ENDOBRONCHIAL NAVIGATION N/A 12/10/2020   Procedure: ROBOTIC VIDEO BRONCHOSCOPY WITH ENDOBRONCHIAL NAVIGATION;  Surgeon: Leslye Peer, MD;  Location: [facility] ENDOSCOPY;   Service: Pulmonary;  Laterality: N/A;    REVIEW OF SYSTEMS:  A comprehensive review of systems was negative except for: Constitutional: positive for fatigue   PHYSICAL EXAMINATION: General appearance: alert, cooperative, and no distress Head: Normocephalic, without obvious abnormality, atraumatic Neck: no adenopathy, no JVD, supple, symmetrical, trachea midline, and thyroid not enlarged, symmetric, no tenderness/mass/nodules Lymph nodes: Cervical, supraclavicular, and axillary nodes normal. Resp: clear to auscultation bilaterally Back: symmetric, no curvature. ROM normal. No CVA tenderness. Cardio: regular rate and rhythm, S1, S2 normal, no murmur, click, rub or gallop GI: soft, non-tender; bowel sounds normal; no masses,  no organomegaly Extremities: extremities normal, atraumatic, no cyanosis or edema  ECOG PERFORMANCE STATUS: 1 - Symptomatic but completely ambulatory  Blood pressure 104/81, pulse 76, temperature 97.9 F (36.6 C), temperature source Oral, resp. rate 17, height 6' (1.829 m), weight 184 lb 11.2 oz (83.8 kg), SpO2 98%.  LABORATORY DATA: Lab Results  Component Value Date   WBC 6.7  12/15/2022   HGB 10.7 (L) 12/15/2022   HCT 31.8 (L) 12/15/2022   MCV 95.5 12/15/2022   PLT 182 12/15/2022      Chemistry      Component Value Date/Time   NA 140 12/15/2022 0743   NA 137 10/24/2022 1540   K 3.5 12/15/2022 0743   CL 105 12/15/2022 0743   CO2 26 12/15/2022 0743   BUN 12 12/15/2022 0743   BUN 9 10/24/2022 1540   CREATININE 0.98 12/15/2022 0743      Component Value Date/Time   CALCIUM 8.7 (L) 12/15/2022 0743   ALKPHOS 49 12/15/2022 0743   AST 19 12/15/2022 0743   ALT 11 12/15/2022 0743   BILITOT 0.4 12/15/2022 0743       RADIOGRAPHIC STUDIES: No results found.  ASSESSMENT AND PLAN: This is a very pleasant 72 years old white male with history of a stage IIIA (T4, N1, M0) non-small cell lung cancer, adenocarcinoma presented with right lower lobe lung mass with  a right hilar and no mediastinal lymphadenopathy and no evidence of extrathoracic disease.  The patient was diagnosed in October 2022 status post 3 cycles of neoadjuvant systemic chemotherapy with carboplatin, Alimta and nivolumab but unfortunately has no evidence for significant improvement of his disease and actually has some evidence for progression. The patient underwent a course of concurrent chemoradiation weekly carboplatin for AUC of 2 and paclitaxel 45 Mg/M2.  Status post 4 cycles. The patient had a rough time with the course of concurrent chemoradiation and he was hospitalized several times during this course. Imaging studies after the course of concurrent chemoradiation showed no concerning findings for disease progression. The patient underwent treatment with immunotherapy with Imfinzi 1500 Mg IV every 4 weeks status post 9 cycles.  This treatment was discontinued secondary to disease progression. The patient is currently on systemic chemotherapy with carboplatin, Alimta and Keytruda status post 10 cycles.  Starting from cycle #5 he has been on treatment with maintenance Alimta and Keytruda every 3 weeks.  The patient has been tolerating this treatment fairly well.    Non-Small Cell Lung Cancer Patient is currently on reduced dose Alimta and Keytruda, with a total of 10 cycles completed. Patient reports improvement since reducing the dose of Alimta. Blood counts are satisfactory, with mild anemia noted. -Continue Maintenance Alimta and Keytruda. -Order CT scan in two weeks to assess treatment response. -Review patient in three weeks.  Medication Management Patient reports discrepancy in medication list, specifically with Prednisone dosing. Patient is currently alternating between 5mg  and 7.5mg , not 20mg  as listed. -Review and correct medication list with nursing staff. -Advise patient to bring all medications to next appointment for review.  Immunizations Patient inquires about flu and  pneumonia vaccinations. -Administer flu vaccine today. -Advise patient to receive pneumonia vaccine from primary care provider, if not received within the last five years.  The patient was advised to call immediately if he has any concerning symptoms in the interval.    The patient voices understanding of current disease status and treatment options and is in agreement with the current care plan.  All questions were answered. The patient knows to call the clinic with any problems, questions or concerns. We can certainly see the patient much sooner if necessary.   Disclaimer: This note was dictated with voice recognition software. Similar sounding words can inadvertently be transcribed and may not be corrected upon review.

## 2023-01-05 NOTE — Progress Notes (Signed)
Patient told this RN that he felt dehydrated. Discussed with Dr. Arbutus Ped. Per MD- patient to drink 60 oz. Of non-caffeinated fluids. Patient aware.

## 2023-01-05 NOTE — Patient Instructions (Signed)
[place] [facility]   Discharge Instructions: Thank you for choosing [facility] to provide your oncology and hematology care.   If you have a lab appointment with the Cancer Center, please go directly to the Cancer Center and check in at the registration area.   Wear comfortable clothing and clothing appropriate for easy access to any Portacath or PICC line.   We strive to give you quality time with your provider. You may need to reschedule your appointment if you arrive late (15 or more minutes).  Arriving late affects you and other patients whose appointments are after yours.  Also, if you miss three or more appointments without notifying the office, you may be dismissed from the clinic at the provider's discretion.      For prescription refill requests, have your pharmacy contact our office and allow 72 hours for refills to be completed.    Today you received the following chemotherapy and/or immunotherapy agents: Pembrolizumab (Keytruda) and Pemetrexed (Alimta)      To help prevent nausea and vomiting after your treatment, we encourage you to take your nausea medication as directed.  BELOW ARE SYMPTOMS THAT SHOULD BE REPORTED IMMEDIATELY: *FEVER GREATER THAN 100.4 F (38 C) OR HIGHER *CHILLS OR SWEATING *NAUSEA AND VOMITING THAT IS NOT CONTROLLED WITH YOUR NAUSEA MEDICATION *UNUSUAL SHORTNESS OF BREATH *UNUSUAL BRUISING OR BLEEDING *URINARY PROBLEMS (pain or burning when urinating, or frequent urination) *BOWEL PROBLEMS (unusual diarrhea, constipation, pain near the anus) TENDERNESS IN MOUTH AND THROAT WITH OR WITHOUT PRESENCE OF ULCERS (sore throat, sores in mouth, or a toothache) UNUSUAL RASH, SWELLING OR PAIN  UNUSUAL VAGINAL DISCHARGE OR ITCHING   Items with * indicate a potential emergency and should be followed up as soon as possible or go to the Emergency Department if any problems should occur.  Please show the CHEMOTHERAPY ALERT  CARD or IMMUNOTHERAPY ALERT CARD at check-in to the Emergency Department and triage nurse.  Should you have questions after your visit or need to cancel or reschedule your appointment, please contact [facility] [facility]  Dept: [phone]  and follow the prompts.  Office hours are 8:00 a.m. to 4:30 p.m. Monday - Friday. Please note that voicemails left after 4:00 p.m. may not be returned until the following business day.  We are closed weekends and major holidays. You have access to a nurse at all times for urgent questions. Please call the main number to the clinic Dept: [phone] and follow the prompts.   For any non-urgent questions, you may also contact your provider using MyChart. We now offer e-Visits for anyone 18 and older to request care online for non-urgent symptoms. For details visit [email or web address].   Also download the MyChart app! Go to the app store, search "MyChart", open the app, select [facility], and log in with your MyChart username and password. 

## 2023-01-12 ENCOUNTER — Telehealth: Payer: Self-pay | Admitting: Internal Medicine

## 2023-01-12 NOTE — Telephone Encounter (Signed)
Prescription Request  01/12/2023  LOV: 10/31/2022  What is the name of the medication or equipment? hydrocodone  Have you contacted your pharmacy to request a refill? No   Which pharmacy would you like this sent to?  [facility] #[id number] [place], [place] - [address] AT [facility] [address] & [facility] [address] [place] [address] Phone: [phone] Fax: [phone]    Patient notified that their request is being sent to the clinical staff for review and that they should receive a response within 2 business days.   Please advise at Mobile There is no such number on file (mobile).

## 2023-01-13 MED ORDER — HYDROCODONE-ACETAMINOPHEN 10-325 MG PO TABS: 1.0000 | ORAL_TABLET | Freq: Two times a day (BID) | ORAL | 0 refills | Status: DC | PRN

## 2023-01-13 NOTE — Telephone Encounter (Signed)
Sent in

## 2023-01-21 ENCOUNTER — Ambulatory Visit (HOSPITAL_COMMUNITY)
Admission: RE | Admit: 2023-01-21 | Discharge: 2023-01-21 | Disposition: A | Discharge status: Home / Self Care | Payer: Medicare Other | Source: Ambulatory Visit | Attending: Internal Medicine | Admitting: Internal Medicine

## 2023-01-21 ENCOUNTER — Other Ambulatory Visit: Payer: Self-pay | Admitting: Internal Medicine

## 2023-01-21 DIAGNOSIS — C3490 Malignant neoplasm of unspecified part of unspecified bronchus or lung: Secondary | ICD-10-CM | POA: Insufficient documentation

## 2023-01-21 DIAGNOSIS — C3491 Malignant neoplasm of unspecified part of right bronchus or lung: Secondary | ICD-10-CM

## 2023-01-21 DIAGNOSIS — N281 Cyst of kidney, acquired: Secondary | ICD-10-CM | POA: Diagnosis not present

## 2023-01-21 DIAGNOSIS — I700 Atherosclerosis of aorta: Secondary | ICD-10-CM | POA: Diagnosis not present

## 2023-01-21 MED ORDER — IOHEXOL 300 MG/ML  SOLN
100.0000 mL | Freq: Once | INTRAMUSCULAR | Status: AC | PRN
  Administered 2023-01-21: 100 mL via INTRAVENOUS

## 2023-01-21 NOTE — Progress Notes (Signed)
[facility] OFFICE PROGRESS NOTE  [name], MD [address] [place] [address]  DIAGNOSIS: Recurrent lung cancer, initially diagnosed as Stage IIIA (T4, N0, M0) non-small cell lung cancer, favoring adenocarcinoma presented with large right lower lobe lung mass with no mediastinal lymphadenopathy or extrathoracic metastasis diagnosed in October 2022. Had disease progression with enlarging mass and new right hilar and mediastinal lymph nodes in December 2022.    PRIOR THERAPY: 1) Neoadjuvant treatment with carboplatin for AUC of 5, Alimta 500 Mg/M2 and nivolumab 360 Mg IV every 3 weeks.  First dose January 23, 2021.  Status post 3 cycles. 2) Concurrent chemoradiation with carboplatin for an AUC of 2 and paclitaxel 45 mg per metered squared.  First dose expected on 04/01/2021.  Status post 4 cycles 3) Consolidation treatment with immunotherapy with Imfinzi 1500 Mg IV every 4 weeks.  First dose on Jul 08, 2021.  Status post 9 cycles.  CURRENT THERAPY: Palliative systemic chemotherapy with carboplatin for an AUC of 5 Alimta 500 mg/m, Keytruda 200 mg IV every 3 weeks. First dose expected on 05/13/2022. Status post 11 cycles.  Starting him cycle #3 his dose of carboplatin will be reduced to AUC of 4 and Alimta 400 Mg/M2 secondary to intolerance. Starting from cycle #5, he will start maintenance treatment with Alimta 400 mg/m2 and Keytruda 200 mg IV every 3 weeks.   INTERVAL HISTORY: Brandon Rubio. 72 y.o. male returns to the clinic today for a follow-up visit.  The patient was last seen by Dr. Arbutus Ped on 01/05/23.  The patient is feeling fairly well today.  The patient typically struggles with fatigue but he states that today is the best he has felt in 3 months and he is unsure why.   He is followed by endocrinology for adrenal insufficiency for which she alternates between 5 mg of prednisone daily and 7.5 mg of prednisone daily.  He has diabetes but is  well-controlled.     The fatigue is still present but better than it was when he was last seen. Denies any fever, chills, or night sweats.  He continues to have baseline dyspnea on exertion which is the same.  He continues to have mild cough which is about the same as it has been for the last 3 or 4 months per patient report. Denies any chest pain or hemoptysis.  He continues to smoke about 1 pack of cigarettes per day.  Denies any nausea, vomiting, diarrhea, or constipation.  Denies any headache or visual changes.  Denies any rashes or skin changes. The patient recently had a restaging CT scan.  He is here today for evaluation and to review his scan before starting cycle #12.   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  MEDICATIONS:  Current Outpatient Medications  Medication Sig Dispense Refill   aspirin EC 81 MG tablet Take 81 mg by mouth in the  morning. Swallow whole.     bisacodyl (DULCOLAX) 5 MG EC tablet Take 5 mg by mouth daily as needed for moderate constipation.     folic acid (FOLVITE) 1 MG tablet TAKE 1 TABLET BY MOUTH EVERY DAY 30 tablet 2   HYDROcodone-acetaminophen (NORCO) 10-325 MG tablet Take 1 tablet by mouth every 12 (twelve) hours as needed. 60 tablet 0   Magnesium Gluconate 550 MG TABS Take 1 tablet (550 mg total) by mouth 2 (two) times daily. 60 tablet 3   metFORMIN (GLUCOPHAGE) 1000 MG tablet TAKE 1 TABLET(1000 MG) BY MOUTH TWICE DAILY WITH A MEAL 180 tablet 1   metoprolol tartrate  (LOPRESSOR) 25 MG tablet Take 1 tablet (25 mg total) by mouth 2 (two) times daily. 60 tablet 3   Multiple Vitamins-Minerals (CENTRUM SILVER 50+MEN) TABS Take 1 tablet by mouth daily with breakfast.     omeprazole (PRILOSEC) 20 MG capsule TAKE 1 CAPSULE BY MOUTH TWICE DAILY BEFORE A MEAL (Patient taking differently: Take 20 mg by mouth 2 (two) times daily before a meal.) 180 capsule 1   ondansetron (ZOFRAN) 8 MG tablet Take 1 tablet (8 mg total) by mouth every 8 (eight) hours as needed for nausea or vomiting. 30 tablet 2   predniSONE (DELTASONE) 20 MG tablet Take 4 tablets (80 mg) daily in the morning for 1 week, followed by 3 tablets (60mg ) daily for 7 days, followed by 2 tablets (40 mg) daily for 1 week, followed by 1 tablet (20 mg) daily for 1 week, followed by 1/2 a tablet (10 mg) daily for 1 week. Then return to your usual prednisone dose (5 mg alternating with 7.5 mg) 74 tablet 0   predniSONE (DELTASONE) 5 MG tablet Take 1-1.5 tablets (5-7.5 mg total) by mouth daily with breakfast. (Patient taking differently: Take 7.5 mg by mouth daily with breakfast.) 135 tablet 3   prochlorperazine (COMPAZINE) 10 MG tablet TAKE 1 TABLET(10 MG) BY MOUTH EVERY 6 HOURS FOR UP TO 14 DAYS AS NEEDED FOR NAUSEA OR VOMITING 60 tablet 1   simvastatin (ZOCOR) 40 MG tablet TAKE 1 TABLET BY MOUTH EVERY EVENING AT 6 PM. (Patient taking differently: Take 40 mg by mouth in the morning.) 90 tablet 3   No current facility-administered medications for this visit.    SURGICAL HISTORY:  Past Surgical History:  Procedure Laterality Date   BRONCHIAL BIOPSY  12/10/2020   Procedure: BRONCHIAL BIOPSIES;  Surgeon: Leslye Peer, MD;  Location: [facility] ENDOSCOPY;  Service: Pulmonary;;   BRONCHIAL BRUSHINGS  12/10/2020   Procedure: BRONCHIAL BRUSHINGS;  Surgeon: Leslye Peer, MD;  Location: [facility] ENDOSCOPY;  Service: Pulmonary;;   BRONCHIAL NEEDLE ASPIRATION BIOPSY  12/10/2020   Procedure: BRONCHIAL NEEDLE ASPIRATION BIOPSIES;   Surgeon: Leslye Peer, MD;  Location: [facility] ENDOSCOPY;  Service: Pulmonary;;   COLONOSCOPY     COLONOSCOPY W/ POLYPECTOMY  03/10/2009   LARYNGOSCOPY  03/10/1997   VIDEO BRONCHOSCOPY WITH ENDOBRONCHIAL NAVIGATION N/A 12/10/2020   Procedure: ROBOTIC VIDEO BRONCHOSCOPY WITH ENDOBRONCHIAL NAVIGATION;  Surgeon: Leslye Peer, MD;  Location: [facility] ENDOSCOPY;  Service: Pulmonary;  Laterality: N/A;    REVIEW OF SYSTEMS:   Constitutional: Positive for fatigue but improved compared to baseline. Negative for appetite change, chills, fever and unexpected weight change.  HENT: Negative for mouth sores, nosebleeds, sore throat and trouble swallowing.   Eyes: Negative for eye problems and icterus.  Respiratory: Positive for baseline dyspnea on exertion and occasional cough negative for  hemoptysis and wheezing.   Cardiovascular: Negative  for chest pain and leg swelling.  Gastrointestinal: Negative for abdominal pain, constipation, diarrhea, nausea and vomiting.  Genitourinary: Negative for bladder incontinence, difficulty urinating, dysuria, frequency and hematuria.   Musculoskeletal: Negative for back pain, gait problem, neck pain and neck stiffness.  Skin: Negative for itching and rash.  Neurological: Negative for dizziness, extremity weakness, gait problem, headaches, light-headedness and seizures.  Hematological: Negative for adenopathy. Does not bruise/bleed easily.  Psychiatric/Behavioral: Negative for confusion, depression and sleep disturbance. The patient is not nervous/anxious.     PHYSICAL EXAMINATION:  Blood pressure 116/66, pulse 67, temperature 98.1 F (36.7 C), temperature source Temporal, resp. rate 17, height 6' (1.829 m), weight 184 lb 14.4 oz (83.9 kg), SpO2 96%.  ECOG PERFORMANCE STATUS: 1  Physical Exam  Constitutional: Oriented to person, place, and time and well-developed, well-nourished, and in no distress.  HENT:  Head: Normocephalic and atraumatic.  Mouth/Throat: Oropharynx  is clear and moist. No oropharyngeal exudate.  Eyes: Conjunctivae are normal. Right eye exhibits no discharge. Left eye exhibits no discharge. No scleral icterus.  Neck: Normal range of motion. Neck supple.  Cardiovascular: Normal rate, regular rhythm, normal heart sounds and intact distal pulses.   Pulmonary/Chest: Effort normal and breath sounds normal. No respiratory distress. No wheezes. No rales.  Abdominal: Soft. Bowel sounds are normal. Exhibits no distension and no mass. There is no tenderness.  Musculoskeletal: Normal range of motion. Exhibits no edema.  Lymphadenopathy:    No cervical adenopathy.  Neurological: Alert and oriented to person, place, and time. Exhibits normal muscle tone. Gait normal. Coordination normal.  Skin: Skin is warm and dry. No rash noted. Not diaphoretic. No erythema. No pallor.  Psychiatric: Mood, memory and judgment normal.  Vitals reviewed.  LABORATORY DATA: Lab Results  Component Value Date   WBC 9.7 01/26/2023   HGB 11.0 (L) 01/26/2023   HCT 32.5 (L) 01/26/2023   MCV 95.9 01/26/2023   PLT 193 01/26/2023      Chemistry      Component Value Date/Time   NA 141 01/26/2023 1045   NA 137 10/24/2022 1540   K 3.6 01/26/2023 1045   CL 105 01/26/2023 1045   CO2 27 01/26/2023 1045   BUN 10 01/26/2023 1045   BUN 9 10/24/2022 1540   CREATININE 0.99 01/26/2023 1045      Component Value Date/Time   CALCIUM 8.5 (L) 01/26/2023 1045   ALKPHOS 45 01/26/2023 1045   AST 23 01/26/2023 1045   ALT 13 01/26/2023 1045   BILITOT 0.4 01/26/2023 1045       RADIOGRAPHIC STUDIES:  CT CHEST ABDOMEN PELVIS W CONTRAST  Addendum Date: 01/26/2023   ADDENDUM REPORT: 01/26/2023 09:04 ADDENDUM: As noted in the body of the report, the stomach is underdistended, but gastric wall thickness appears at upper portion to the degree of underdistention. While similar to prior study, gastritis cannot be excluded. Electronically Signed   By: Kennith Center M.D.   On: 01/26/2023  09:04   Result Date: 01/26/2023 CLINICAL DATA:  Non-small-cell lung cancer. Restaging. * Tracking Code: BO * EXAM: CT CHEST, ABDOMEN, AND PELVIS WITH CONTRAST TECHNIQUE: Multidetector CT imaging of the chest, abdomen and pelvis was performed following the standard protocol during bolus administration of intravenous contrast. RADIATION DOSE REDUCTION: This exam was performed according to the departmental dose-optimization program which includes automated exposure control, adjustment of the mA and/or kV according to patient size and/or use of iterative reconstruction technique. CONTRAST:  OMNIPAQUE IOHEXOL 300 MG/ML  SOLN COMPARISON:  10/29/2022 FINDINGS: CT CHEST FINDINGS Cardiovascular: The heart size is normal. No substantial pericardial effusion. Coronary artery calcification is evident. Mild atherosclerotic calcification is noted in the wall of the thoracic aorta. Mediastinum/Nodes: No mediastinal lymphadenopathy. There is no hilar lymphadenopathy. The esophagus has normal imaging features. There is no axillary lymphadenopathy. Lungs/Pleura: Centrilobular and paraseptal emphysema evident. Right parahilar parenchymal scarring extends into the posterior right lower lobe, similar in the interval. Numerous ground-glass opacities again noted bilaterally index right parahilar ground-glass opacity seen previously has become more confluent in the interval (72/5) measuring 16 x 10 mm. Index nodule measured posterior left upper lobe previously at 2.4 x 1.4 cm is now 3.2 x 2.0 cm (72/5). Index 3.0 x 2.4 cm ground-glass opacity in the left lower lobe measured previously is now on the order of 2.8 x 3.0 cm when measured at the same level. There is a cluster of nodules in this region on the previous study which have now become in essentially multi lobular confluent ground-glass opacity. Tiny right pleural effusion is similar to prior. Musculoskeletal: No worrisome lytic or sclerotic osseous abnormality. CT ABDOMEN  PELVIS FINDINGS Hepatobiliary: No suspicious focal abnormality within the liver parenchyma. There is no evidence for gallstones, gallbladder wall thickening, or pericholecystic fluid. No intrahepatic or extrahepatic biliary dilation. Pancreas: Similar tiny foci of hypo attenuation in the pancreatic head. No peripancreatic edema. Spleen: No splenomegaly. No suspicious focal mass lesion. Adrenals/Urinary Tract: No adrenal nodule or mass. Right kidney unremarkable. Left renal cyst evident. No evidence for hydroureter. The urinary bladder appears normal for the degree of distention. Stomach/Bowel: Diffuse gastric wall thickening suspected despite underdistention. Duodenum is normally positioned as is the ligament of Treitz. No small bowel wall thickening. No small bowel dilatation. The terminal ileum is normal. The appendix is normal. No gross colonic mass. No colonic wall thickening. Vascular/Lymphatic: There is moderate atherosclerotic calcification of the abdominal aorta without aneurysm. There is no gastrohepatic or hepatoduodenal ligament lymphadenopathy. No retroperitoneal or mesenteric lymphadenopathy. No pelvic sidewall lymphadenopathy. 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 Reproductive: Borderline prostatomegaly. Other: No intraperitoneal free fluid. Musculoskeletal: No worrisome lytic or sclerotic osseous abnormality. IMPRESSION: 1. Interval progression of disease. Numerous ground-glass opacities again noted bilaterally. Index nodule measured posterior left upper lobe previously at 2.4 x 1.4 cm is now 3.2 x 2.0 cm. There is a cluster of nodules in the left lower lobe on the previous study which have now become an essentially multi lobular confluent ground-glass opacity. Findings are concerning for progressive disease. 2. Right parahilar ground-glass nodule seen previously has become more confluent in the interval. 3. No evidence for metastatic disease in the abdomen or pelvis. 4. Diffuse  gastric wall thickening suspected despite underdistention. Imaging features could be related to gastritis. 5. Similar appearance low-density foci in the pancreatic head. Continued attention on follow-up recommended. 6. Aortic Atherosclerosis (ICD10-I70.0) and Emphysema (ICD10-J43.9). Electronically Signed: By: Kennith Center M.D. On: 01/26/2023 08:52     ASSESSMENT/PLAN:  This is a very pleasant 72 year old Caucasian male with recurrent and metastatic non-small cell lung cancer, adenocarcinoma.  He was initially diagnosed as a stage IIIa (T4, N1, M0).  He presented with a right upper lobe mass and right hilar and mediastinal lymphadenopathy.  He was initially diagnosed in October 2022.  The patient developed metastatic disease with bilateral pulmonary nodules in January 2024. He does not have any actionable mutations by foundation one.    The patient initially underwent 3 cycles of neoadjuvant systemic chemotherapy with carboplatin,  Alimta, nivolumab.  Unfortunately the patient did not have significant improvement in his disease and actually developed disease progression.   He then underwent a course of concurrent chemoradiation.  He is currently on consolidation immunotherapy with Imfinzi 1500 mg IV every 4 weeks and is status post 9 cycles.   Therefore, he was started on systemic chemotherapy with carboplatin for an AUC of 5, Alimta 500 mg/m, and Keytruda 200 mg IV every 3 weeks. He underwent his first dose of treatment on 05/13/22.  He is status post 11 cycles.  His dose of carboplatin was reduced to an AUC of 5 and Alimta to 400 mg/m2 starting from cycle #3 due to intolerance.  He started maintenance treatment with dose reduced Alimta 400 mg/m and Keytruda 200 mg IV every 3 weeks starting from cycle #5.  The patient was seen with Dr. Arbutus Ped today.  Dr. Arbutus Ped personally and independently reviewed the scan and discussed results with the patient today.  The scan showed interval growth of numerous  groundglass opacities bilaterally.  Dr. Arbutus Ped discussed this could be related to immunotherapy mediated versus progressive disease.  Dr. Arbutus Ped recommends a high-dose prednisone taper followed by a repeat scan in about 4 to 5 weeks.  If improvement in the groundglass opacities, this is likely secondary to his immunotherapy.  If those remain stable to progressive, then he likely has progressive disease and we will need to discuss alternative treatment options.  I have sent a high-dose prednisone taper to the patient's pharmacy.  He will start taking 80 mg daily for 1 week followed by 60 mg daily for 1 week followed by 40 mg daily for 1 week followed by 20 mg daily for 1 week followed by 10 mg daily for 1 week followed by his normal maintenance dose of prednisone where he alternates 5 mg and 7.5 mg.  Patient does have diabetes that is well-controlled.  I let the patient know that high-dose steroids can increase his blood sugar and to monitor for signs and symptoms of hyperglycemia.  He was instructed to hold off on taking his current prescription of prednisone (7.5 and 5 mg).   I will place an order for repeat CT scan of the chest next month.  We will see him back for a follow-up visit a few days later.  He will proceed with treatment today as scheduled with single agent chemotherapy with Alimta.  I have reviewed Keytruda from his care plan from today.   We will remove his chemotherapy that is scheduled for December.  I will put his care plan on hold after his infusion today.   The patient was advised to call immediately if he has any concerning symptoms in the interval. The patient voices understanding of current disease status and treatment options and is in agreement with the current care plan. All questions were answered. The patient knows to call the clinic with any problems, questions or concerns. We can certainly see the patient much sooner if necessary   Orders Placed This Encounter   Procedures   CT Chest W Contrast    Standing Status:   Future    Standing Expiration Date:   01/26/2024    Order Specific Question:   If indicated for the ordered procedure, I authorize the administration of contrast media per Radiology protocol    Answer:   Yes    Order Specific Question:   Does the patient have a contrast media/X-ray dye allergy?    Answer:   No  Order Specific Question:   Preferred imaging location?    Answer:   [facility]      Rhianna Raulerson L Brannon Levene, PA-C 01/26/23  ADDENDUM: Hematology/Oncology Attending: I had a face-to-face encounter with the patient today.  I reviewed his record, lab, scan and recommended his care plan.  This is a very pleasant 72 years old white male with recurrent non-small cell lung cancer diagnosed in October 2022 initially as stage IIIa disease status post neoadjuvant systemic chemotherapy with carboplatin, Alimta and nivolumab with no improvement of his disease.  This was followed by a course of concurrent chemoradiation followed by consolidation treatment with immunotherapy for 9 cycles discontinued secondary to disease progression. The patient started systemic chemotherapy with carboplatin, Alimta and Keytruda for 4 cycles and has been on maintenance treatment with reduced dose of Alimta and Keytruda status post total of 11 cycles.  He has been tolerating his treatment fairly well and recently he feels very good. He had repeat CT scan of the chest, abdomen and pelvis performed recently.  I personally and independently reviewed the scan images and discussed the result with the patient today. His scan showed interval progression of disease with numerous groundglass opacities again noted bilaterally with the index nodule measured 2.4 x 1.4 cm previously in the posterior left upper lobe and currently 3.2 x 2.0 cm.  There are several other areas of groundglass opacity again suspicious for disease progression but immunotherapy mediated  pneumonitis could not be completely excluded based on these images. I had a lengthy discussion with the patient today about his current condition and treatment options. I recommended for him to hold his treatment with [facility] for now.  He will proceed with Alimta as a single agent for this cycle.  I will start him on a tapered dose of prednisone over the next 4-5 weeks followed by repeat CT scan of the chest for reevaluation of these groundglass opacities and to rule out the possibility of immunotherapy mediated pneumonitis. If the upcoming scan showed persistent disease progression, I will discuss with him switching his treatment to a different regimen. The patient is in agreement with the current plan. He was advised to call immediately if he has any other concerning symptoms in the interval. The total time spent in the appointment was 30 minutes. Disclaimer: This note was dictated with voice recognition software. Similar sounding words can inadvertently be transcribed and may be missed upon review. Lajuana Matte, MD

## 2023-01-26 ENCOUNTER — Inpatient Hospital Stay (HOSPITAL_BASED_OUTPATIENT_CLINIC_OR_DEPARTMENT_OTHER): Payer: Medicare Other | Admitting: Physician Assistant

## 2023-01-26 ENCOUNTER — Other Ambulatory Visit: Payer: Self-pay

## 2023-01-26 ENCOUNTER — Inpatient Hospital Stay: Payer: Medicare Other | Attending: Internal Medicine

## 2023-01-26 ENCOUNTER — Inpatient Hospital Stay: Payer: Medicare Other

## 2023-01-26 ENCOUNTER — Other Ambulatory Visit: Payer: Self-pay | Admitting: Physician Assistant

## 2023-01-26 VITALS — BP 116/66 | HR 67 | Temp 98.1°F | Resp 17 | Ht 72.0 in | Wt 184.9 lb

## 2023-01-26 DIAGNOSIS — Z5112 Encounter for antineoplastic immunotherapy: Secondary | ICD-10-CM | POA: Diagnosis not present

## 2023-01-26 DIAGNOSIS — Z5111 Encounter for antineoplastic chemotherapy: Secondary | ICD-10-CM

## 2023-01-26 DIAGNOSIS — Z85118 Personal history of other malignant neoplasm of bronchus and lung: Secondary | ICD-10-CM | POA: Insufficient documentation

## 2023-01-26 DIAGNOSIS — C3490 Malignant neoplasm of unspecified part of unspecified bronchus or lung: Secondary | ICD-10-CM | POA: Diagnosis not present

## 2023-01-26 DIAGNOSIS — C3491 Malignant neoplasm of unspecified part of right bronchus or lung: Secondary | ICD-10-CM

## 2023-01-26 DIAGNOSIS — C3411 Malignant neoplasm of upper lobe, right bronchus or lung: Secondary | ICD-10-CM | POA: Diagnosis not present

## 2023-01-26 DIAGNOSIS — R918 Other nonspecific abnormal finding of lung field: Secondary | ICD-10-CM | POA: Diagnosis not present

## 2023-01-26 DIAGNOSIS — F17210 Nicotine dependence, cigarettes, uncomplicated: Secondary | ICD-10-CM | POA: Insufficient documentation

## 2023-01-26 DIAGNOSIS — C7800 Secondary malignant neoplasm of unspecified lung: Secondary | ICD-10-CM | POA: Diagnosis not present

## 2023-01-26 DIAGNOSIS — C3431 Malignant neoplasm of lower lobe, right bronchus or lung: Secondary | ICD-10-CM | POA: Diagnosis present

## 2023-01-26 DIAGNOSIS — Z79899 Other long term (current) drug therapy: Secondary | ICD-10-CM | POA: Insufficient documentation

## 2023-01-26 LAB — CBC WITH DIFFERENTIAL (CANCER CENTER ONLY)
Abs Immature Granulocytes: 0.04 10*3/uL (ref 0.00–0.07)
Basophils Absolute: 0.1 10*3/uL (ref 0.0–0.1)
Basophils Relative: 1 %
Eosinophils Absolute: 0.2 10*3/uL (ref 0.0–0.5)
Eosinophils Relative: 2 %
HCT: 32.5 % — ABNORMAL LOW (ref 39.0–52.0)
Hemoglobin: 11 g/dL — ABNORMAL LOW (ref 13.0–17.0)
Immature Granulocytes: 0 %
Lymphocytes Relative: 15 %
Lymphs Abs: 1.4 10*3/uL (ref 0.7–4.0)
MCH: 32.4 pg (ref 26.0–34.0)
MCHC: 33.8 g/dL (ref 30.0–36.0)
MCV: 95.9 fL (ref 80.0–100.0)
Monocytes Absolute: 0.7 10*3/uL (ref 0.1–1.0)
Monocytes Relative: 8 %
Neutro Abs: 7.2 10*3/uL (ref 1.7–7.7)
Neutrophils Relative %: 74 %
Platelet Count: 193 10*3/uL (ref 150–400)
RBC: 3.39 MIL/uL — ABNORMAL LOW (ref 4.22–5.81)
RDW: 14 % (ref 11.5–15.5)
WBC Count: 9.7 10*3/uL (ref 4.0–10.5)
nRBC: 0 % (ref 0.0–0.2)

## 2023-01-26 LAB — CMP (CANCER CENTER ONLY)
ALT: 13 U/L (ref 0–44)
AST: 23 U/L (ref 15–41)
Albumin: 3.8 g/dL (ref 3.5–5.0)
Alkaline Phosphatase: 45 U/L (ref 38–126)
Anion gap: 9 (ref 5–15)
BUN: 10 mg/dL (ref 8–23)
CO2: 27 mmol/L (ref 22–32)
Calcium: 8.5 mg/dL — ABNORMAL LOW (ref 8.9–10.3)
Chloride: 105 mmol/L (ref 98–111)
Creatinine: 0.99 mg/dL (ref 0.61–1.24)
GFR, Estimated: >60 mL/min (ref >60)
Glucose, Bld: 104 mg/dL — ABNORMAL HIGH (ref 70–99)
Potassium: 3.6 mmol/L (ref 3.5–5.1)
Sodium: 141 mmol/L (ref 135–145)
Total Bilirubin: 0.4 mg/dL (ref <1.2)
Total Protein: 7 g/dL (ref 6.5–8.1)

## 2023-01-26 LAB — TSH: TSH: 3.237 u[IU]/mL (ref 0.350–4.500)

## 2023-01-26 MED ORDER — PREDNISONE 20 MG PO TABS: ORAL_TABLET | ORAL | 0 refills | Status: DC

## 2023-01-26 MED ORDER — PALONOSETRON HCL INJECTION 0.25 MG/5ML
0.2500 mg | Freq: Once | INTRAVENOUS | Status: AC
  Administered 2023-01-26: 0.25 mg via INTRAVENOUS
  Filled 2023-01-26: qty 5

## 2023-01-26 MED ORDER — CYANOCOBALAMIN 1000 MCG/ML IJ SOLN
1000.0000 ug | Freq: Once | INTRAMUSCULAR | Status: AC
  Administered 2023-01-26: 1000 ug via INTRAMUSCULAR
  Filled 2023-01-26: qty 1

## 2023-01-26 MED ORDER — SODIUM CHLORIDE 0.9 % IV SOLN: Freq: Once | INTRAVENOUS | Status: AC

## 2023-01-26 MED ORDER — SODIUM CHLORIDE 0.9 % IV SOLN
400.0000 mg/m2 | Freq: Once | INTRAVENOUS | Status: AC
  Administered 2023-01-26: 900 mg via INTRAVENOUS
  Filled 2023-01-26: qty 20

## 2023-01-27 LAB — T4: T4, Total: 9.7 ug/dL (ref 4.5–12.0)

## 2023-02-09 ENCOUNTER — Telehealth: Payer: Self-pay | Admitting: Internal Medicine

## 2023-02-09 MED ORDER — HYDROCODONE-ACETAMINOPHEN 10-325 MG PO TABS: 1.0000 | ORAL_TABLET | Freq: Two times a day (BID) | ORAL | 0 refills | Status: DC | PRN

## 2023-02-09 NOTE — Telephone Encounter (Signed)
Prescription Request  02/09/2023  LOV: 10/31/2022  What is the name of the medication or equipment? HYDROcodone-acetaminophen (NORCO) 10-325 MG tablet   Have you contacted your pharmacy to request a refill? No   Which pharmacy would you like this sent to?  [facility] #[id number] [place], [place] - [address] AT [facility] OF [address] & [facility] [place] [address] Phone: [phone] Fax: [phone]    Patient notified that their request is being sent to the clinical staff for review and that they should receive a response within 2 business days.   Please advise at Mobile There is no such number on file (mobile).

## 2023-02-09 NOTE — Telephone Encounter (Signed)
Sent in

## 2023-02-16 ENCOUNTER — Other Ambulatory Visit: Payer: Medicare Other

## 2023-02-16 ENCOUNTER — Ambulatory Visit: Payer: Medicare Other | Admitting: Physician Assistant

## 2023-02-16 ENCOUNTER — Ambulatory Visit: Payer: Medicare Other

## 2023-02-16 ENCOUNTER — Other Ambulatory Visit: Payer: Self-pay | Admitting: Internal Medicine

## 2023-02-16 DIAGNOSIS — C3491 Malignant neoplasm of unspecified part of right bronchus or lung: Secondary | ICD-10-CM

## 2023-02-17 ENCOUNTER — Telehealth: Payer: Self-pay | Admitting: Medical Oncology

## 2023-02-17 NOTE — Telephone Encounter (Signed)
Future appts confirmed with pt.

## 2023-03-02 ENCOUNTER — Ambulatory Visit (HOSPITAL_COMMUNITY): Payer: Medicare Other

## 2023-03-09 ENCOUNTER — Ambulatory Visit: Payer: Medicare Other

## 2023-03-09 ENCOUNTER — Inpatient Hospital Stay: Payer: Medicare Other

## 2023-03-09 ENCOUNTER — Ambulatory Visit (HOSPITAL_COMMUNITY)
Admission: RE | Admit: 2023-03-09 | Discharge: 2023-03-09 | Disposition: A | Discharge status: Home / Self Care | Payer: Medicare Other | Source: Ambulatory Visit | Attending: Physician Assistant | Admitting: Physician Assistant

## 2023-03-09 ENCOUNTER — Encounter (HOSPITAL_COMMUNITY): Payer: Self-pay

## 2023-03-09 ENCOUNTER — Other Ambulatory Visit: Payer: Medicare Other

## 2023-03-09 ENCOUNTER — Ambulatory Visit: Payer: Medicare Other | Admitting: Physician Assistant

## 2023-03-09 DIAGNOSIS — J439 Emphysema, unspecified: Secondary | ICD-10-CM | POA: Diagnosis not present

## 2023-03-09 DIAGNOSIS — R918 Other nonspecific abnormal finding of lung field: Secondary | ICD-10-CM | POA: Diagnosis not present

## 2023-03-09 DIAGNOSIS — E118 Type 2 diabetes mellitus with unspecified complications: Secondary | ICD-10-CM | POA: Insufficient documentation

## 2023-03-09 DIAGNOSIS — C3490 Malignant neoplasm of unspecified part of unspecified bronchus or lung: Secondary | ICD-10-CM | POA: Diagnosis not present

## 2023-03-09 DIAGNOSIS — I700 Atherosclerosis of aorta: Secondary | ICD-10-CM | POA: Diagnosis not present

## 2023-03-09 LAB — POCT I-STAT CREATININE: Creatinine, Ser: 1.2 mg/dL (ref 0.61–1.24)

## 2023-03-09 MED ORDER — IOHEXOL 300 MG/ML  SOLN
75.0000 mL | Freq: Once | INTRAMUSCULAR | Status: AC | PRN
  Administered 2023-03-09: 75 mL via INTRAVENOUS

## 2023-03-16 ENCOUNTER — Telehealth: Payer: Self-pay

## 2023-03-16 ENCOUNTER — Other Ambulatory Visit: Payer: Self-pay | Admitting: Internal Medicine

## 2023-03-16 MED ORDER — HYDROCODONE-ACETAMINOPHEN 10-325 MG PO TABS: 1.0000 | ORAL_TABLET | Freq: Two times a day (BID) | ORAL | 0 refills | Status: DC | PRN

## 2023-03-16 NOTE — Telephone Encounter (Signed)
 Copied from CRM [id number]. Topic: Clinical - Medication Refill >> Mar 16, 2023 10:52 AM Jasmine D wrote: Most Recent Primary Care Visit:  Provider: ROLLENE NORRIS A  Department: [facility]  Visit Type: HOSPITAL FU  Date: 10/31/2022  Medication: HYDROcodone -acetaminophen  (NORCO) 10-325 MG tablet  Has the patient contacted their pharmacy? No (Agent: If no, request that the patient contact the pharmacy for the refill. If patient does not wish to contact the pharmacy document the reason why and proceed with request.) (Agent: If yes, when and what did the pharmacy advise?)  Is this the correct pharmacy for this prescription? Yes If no, delete pharmacy and type the correct one.  This is the patient's preferred pharmacy:  [facility] #[id number] [place], [place] - [address] AT [facility] OF [address] & [facility] [address] [place] [address] Phone: [phone] Fax: [phone]   Has the prescription been filled recently? No  Is the patient out of the medication? No  Has the patient been seen for an appointment in the last year OR does the patient have an upcoming appointment? Yes  Can we respond through MyChart? Yes  Agent: Please be advised that Rx refills may take up to 3 business days. We ask that you follow-up with your pharmacy.

## 2023-03-16 NOTE — Telephone Encounter (Signed)
 This will be sent in when provider review this came in through rx refill

## 2023-03-17 ENCOUNTER — Inpatient Hospital Stay: Payer: Medicare Other

## 2023-03-17 ENCOUNTER — Inpatient Hospital Stay: Payer: Medicare Other | Attending: Internal Medicine | Admitting: Internal Medicine

## 2023-03-17 VITALS — BP 112/67 | HR 81 | Temp 97.6°F | Resp 17 | Ht 72.0 in | Wt 193.0 lb

## 2023-03-17 DIAGNOSIS — Z5111 Encounter for antineoplastic chemotherapy: Secondary | ICD-10-CM | POA: Insufficient documentation

## 2023-03-17 DIAGNOSIS — Z5112 Encounter for antineoplastic immunotherapy: Secondary | ICD-10-CM | POA: Diagnosis not present

## 2023-03-17 DIAGNOSIS — C3490 Malignant neoplasm of unspecified part of unspecified bronchus or lung: Secondary | ICD-10-CM

## 2023-03-17 DIAGNOSIS — Z79899 Other long term (current) drug therapy: Secondary | ICD-10-CM | POA: Diagnosis not present

## 2023-03-17 DIAGNOSIS — C3431 Malignant neoplasm of lower lobe, right bronchus or lung: Secondary | ICD-10-CM | POA: Diagnosis not present

## 2023-03-17 DIAGNOSIS — C778 Secondary and unspecified malignant neoplasm of lymph nodes of multiple regions: Secondary | ICD-10-CM | POA: Insufficient documentation

## 2023-03-17 DIAGNOSIS — Z5189 Encounter for other specified aftercare: Secondary | ICD-10-CM | POA: Diagnosis not present

## 2023-03-17 LAB — CMP (CANCER CENTER ONLY)
ALT: 12 U/L (ref 0–44)
AST: 16 U/L (ref 15–41)
Albumin: 4 g/dL (ref 3.5–5.0)
Alkaline Phosphatase: 50 U/L (ref 38–126)
Anion gap: 8 (ref 5–15)
BUN: 13 mg/dL (ref 8–23)
CO2: 27 mmol/L (ref 22–32)
Calcium: 9.1 mg/dL (ref 8.9–10.3)
Chloride: 106 mmol/L (ref 98–111)
Creatinine: 1.04 mg/dL (ref 0.61–1.24)
GFR, Estimated: >60 mL/min (ref >60)
Glucose, Bld: 134 mg/dL — ABNORMAL HIGH (ref 70–99)
Potassium: 3.8 mmol/L (ref 3.5–5.1)
Sodium: 141 mmol/L (ref 135–145)
Total Bilirubin: 0.4 mg/dL (ref 0.0–1.2)
Total Protein: 7.2 g/dL (ref 6.5–8.1)

## 2023-03-17 LAB — CBC WITH DIFFERENTIAL (CANCER CENTER ONLY)
Abs Immature Granulocytes: 0.02 10*3/uL (ref 0.00–0.07)
Basophils Absolute: 0.1 10*3/uL (ref 0.0–0.1)
Basophils Relative: 1 %
Eosinophils Absolute: 0.2 10*3/uL (ref 0.0–0.5)
Eosinophils Relative: 3 %
HCT: 35.6 % — ABNORMAL LOW (ref 39.0–52.0)
Hemoglobin: 12.1 g/dL — ABNORMAL LOW (ref 13.0–17.0)
Immature Granulocytes: 0 %
Lymphocytes Relative: 28 %
Lymphs Abs: 1.9 10*3/uL (ref 0.7–4.0)
MCH: 32.4 pg (ref 26.0–34.0)
MCHC: 34 g/dL (ref 30.0–36.0)
MCV: 95.2 fL (ref 80.0–100.0)
Monocytes Absolute: 0.5 10*3/uL (ref 0.1–1.0)
Monocytes Relative: 8 %
Neutro Abs: 4 10*3/uL (ref 1.7–7.7)
Neutrophils Relative %: 60 %
Platelet Count: 172 10*3/uL (ref 150–400)
RBC: 3.74 MIL/uL — ABNORMAL LOW (ref 4.22–5.81)
RDW: 12.6 % (ref 11.5–15.5)
WBC Count: 6.7 10*3/uL (ref 4.0–10.5)
nRBC: 0 % (ref 0.0–0.2)

## 2023-03-17 NOTE — Progress Notes (Signed)
 [facility] [phone]   [phone]  OFFICE PROGRESS NOTE  Brandon Almarie LABOR, MD [address] [place] [address]  DIAGNOSIS: Stage IIIA (T4, N0, M0) non-small cell lung cancer, favoring adenocarcinoma presented with large right lower lobe lung mass with no mediastinal lymphadenopathy or extrathoracic metastasis diagnosed in October 2022. Had disease progression with enlarging mass and new right hilar and mediastinal lymph nodes in December 2022.      PRIOR THERAPY:  1) Neoadjuvant treatment with carboplatin  for AUC of 5, Alimta  500 Mg/M2 and nivolumab  360 Mg IV every 3 weeks.  First dose January 23, 2021.  Status post 3 cycles. 2) Concurrent chemoradiation with carboplatin  for an AUC of 2 and paclitaxel  45 mg per metered squared.  First dose expected on 04/01/2021.  Status post 4 cycles 3) Consolidation treatment with immunotherapy with Imfinzi  1500 Mg IV every 4 weeks.  First dose on Jul 08, 2021.  Status post 9 cycles.    CURRENT THERAPY: Palliative systemic chemotherapy with carboplatin  for an AUC of 5, Alimta  500 mg/m, Keytruda  200 mg IV every 3 weeks. First dose on 05/13/2022.  Status post 12 cycles.  Starting him cycle #3 his dose of carboplatin  will be reduced to AUC of 4 and Alimta  400 Mg/M2 secondary to intolerance.  INTERVAL HISTORY: Brandon Rubio. 73 y.o. male returns to the clinic today for follow-up visit.Discussed the use of AI scribe software for clinical note transcription with the patient, who gave verbal consent to proceed.  History of Present Illness   Brandon Rubio, a 73 year old patient, was initially diagnosed with stage 3A metastatic non-small cell lung cancer, adenocarcinoma, in October 2022. The patient underwent neoadjuvant chemotherapy with immunotherapy, followed by a course of chemotherapy and radiation due to cancer growth. The patient was then placed on consolidation treatment with Imfinizi for 9  cycles before it was discontinued secondary to disease progression. He then started Palliative systemic chemotherapy with carboplatin  for an AUC of 5, Alimta  500 mg/m, Keytruda  200 mg IV every 3 weeks. First dose on 05/13/2022.  Status post 12 cycles.  Starting him cycle #3 his dose of carboplatin  will be reduced to AUC of 4 and Alimta  400 Mg/M2 secondary to intolerance. .  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 A long course of Tabor prednisone  was administered, which the patient completed about two weeks prior to the current visit.  The patient reported feeling physically fine, albeit more fatigued than usual, with no new symptoms such as nausea, vomiting, diarrhea, chest pain, or increased shortness of breath. The patient had recovered from a stomach virus a few weeks prior.  The patient also underwent radiation therapy for the right lung, the effectiveness of which is yet to be determined. The patient expressed concerns about the progression of cancer, suspecting that the current chemotherapy might not be working.        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  MEDICATIONS:  Current Outpatient Medications  Medication Sig Dispense Refill   aspirin  EC 81 MG tablet Take 81 mg by mouth in the morning. Swallow whole.     bisacodyl (DULCOLAX) 5 MG EC tablet Take 5 mg by mouth daily as needed  for moderate constipation.     folic acid  (FOLVITE ) 1 MG tablet TAKE 1 TABLET BY MOUTH EVERY DAY 30 tablet 2   HYDROcodone -acetaminophen  (NORCO) 10-325 MG tablet Take 1 tablet by mouth every 12 (twelve) hours as needed. 60 tablet 0   Magnesium  Gluconate 550 MG TABS Take 1 tablet (550 mg total) by mouth 2 (two) times daily. 60 tablet 3   metFORMIN  (GLUCOPHAGE ) 1000 MG tablet TAKE 1 TABLET(1000 MG) BY MOUTH TWICE DAILY WITH A MEAL 180 tablet 1   metoprolol  tartrate (LOPRESSOR ) 25 MG tablet Take 1 tablet (25 mg total) by mouth 2 (two) times daily. 60 tablet 3   Multiple Vitamins-Minerals (CENTRUM SILVER 50+MEN) TABS Take 1 tablet by mouth daily with breakfast.     omeprazole  (PRILOSEC) 20 MG capsule TAKE 1 CAPSULE BY MOUTH TWICE DAILY BEFORE A MEAL 180 capsule 1   ondansetron  (ZOFRAN ) 8 MG tablet Take 1 tablet (8 mg total) by mouth every 8 (eight) hours as needed for nausea or vomiting. 30 tablet 2   predniSONE  (DELTASONE ) 20 MG tablet Take 4 tablets (80 mg) daily in the morning for 1 week, followed by 3 tablets (60mg ) daily for 7 days, followed by 2 tablets (40 mg) daily for 1 week, followed by 1 tablet (20 mg) daily for 1 week, followed by 1/2 a tablet (10 mg) daily for 1 week. Then return to your usual prednisone  dose (5 mg alternating with 7.5 mg) 74 tablet 0   predniSONE  (DELTASONE ) 5 MG tablet Take 1-1.5 tablets (5-7.5 mg total) by mouth daily with breakfast. (Patient taking differently: Take 7.5 mg by mouth daily with breakfast.) 135 tablet 3   prochlorperazine  (COMPAZINE ) 10 MG tablet TAKE 1 TABLET(10 MG) BY MOUTH EVERY 6 HOURS FOR UP TO 14 DAYS AS NEEDED FOR NAUSEA OR VOMITING 60 tablet 1   simvastatin  (ZOCOR ) 40 MG tablet TAKE 1 TABLET BY MOUTH EVERY EVENING AT 6 PM. (Patient taking differently: Take 40 mg by mouth in the morning.) 90 tablet 3   No current facility-administered medications for this visit.    SURGICAL HISTORY:  Past Surgical History:  Procedure Laterality Date   BRONCHIAL  BIOPSY  12/10/2020   Procedure: BRONCHIAL BIOPSIES;  Surgeon: Shelah Lamar RAMAN, MD;  Location: [facility] ENDOSCOPY;  Service: Pulmonary;;   BRONCHIAL BRUSHINGS  12/10/2020   Procedure: BRONCHIAL BRUSHINGS;  Surgeon: Shelah Lamar RAMAN, MD;  Location: [facility] ENDOSCOPY;  Service: Pulmonary;;   BRONCHIAL NEEDLE ASPIRATION BIOPSY  12/10/2020   Procedure: BRONCHIAL NEEDLE ASPIRATION BIOPSIES;  Surgeon: Shelah Lamar RAMAN, MD;  Location: [facility] ENDOSCOPY;  Service: Pulmonary;;   COLONOSCOPY     COLONOSCOPY W/ POLYPECTOMY  03/10/2009   LARYNGOSCOPY  03/10/1997   VIDEO BRONCHOSCOPY WITH ENDOBRONCHIAL NAVIGATION N/A 12/10/2020   Procedure: ROBOTIC VIDEO BRONCHOSCOPY WITH ENDOBRONCHIAL NAVIGATION;  Surgeon: Shelah Lamar RAMAN, MD;  Location: [facility] ENDOSCOPY;  Service: Pulmonary;  Laterality: N/A;    REVIEW OF SYSTEMS:  Constitutional: positive for fatigue Eyes: negative Ears, nose, mouth, throat, and face: negative Respiratory: positive for dyspnea on exertion Cardiovascular: negative Gastrointestinal: negative Genitourinary:negative Integument/breast: negative Hematologic/lymphatic: negative  Musculoskeletal:negative Neurological: negative Behavioral/Psych: negative Endocrine: negative Allergic/Immunologic: negative   PHYSICAL EXAMINATION: General appearance: alert, cooperative, fatigued, and no distress Head: Normocephalic, without obvious abnormality, atraumatic Neck: no adenopathy, no JVD, supple, symmetrical, trachea midline, and thyroid  not enlarged, symmetric, no tenderness/mass/nodules Lymph nodes: Cervical, supraclavicular, and axillary nodes normal. Resp: clear to auscultation bilaterally Back: symmetric, no curvature. ROM normal. No CVA tenderness. Cardio: regular rate and rhythm, S1, S2 normal, no murmur, click, rub or gallop GI: soft, non-tender; bowel sounds normal; no masses,  no organomegaly Extremities: extremities normal, atraumatic, no cyanosis or edema Neurologic: Alert and oriented X 3, normal  strength and tone. Normal symmetric reflexes. Normal coordination and gait  ECOG PERFORMANCE STATUS: 1 - Symptomatic but completely ambulatory  Blood pressure 112/67, pulse 81, temperature 97.6 F (36.4 C), temperature source Temporal, resp. rate 17, height 6' (1.829 m), weight 193 lb (87.5 kg), SpO2 95%.  LABORATORY DATA: Lab Results  Component Value Date   WBC 6.7 03/17/2023   HGB 12.1 (L) 03/17/2023   HCT 35.6 (L) 03/17/2023   MCV 95.2 03/17/2023   PLT 172 03/17/2023      Chemistry      Component Value Date/Time   NA 141 01/26/2023 1045   NA 137 10/24/2022 1540   K 3.6 01/26/2023 1045   CL 105 01/26/2023 1045   CO2 27 01/26/2023 1045   BUN 10 01/26/2023 1045   BUN 9 10/24/2022 1540   CREATININE 1.20 03/09/2023 1347   CREATININE 0.99 01/26/2023 1045      Component Value Date/Time   CALCIUM  8.5 (L) 01/26/2023 1045   ALKPHOS 45 01/26/2023 1045   AST 23 01/26/2023 1045   ALT 13 01/26/2023 1045   BILITOT 0.4 01/26/2023 1045       RADIOGRAPHIC STUDIES: No results found.  ASSESSMENT AND PLAN: This is a very pleasant 73 years old white male with history of a stage IIIA (T4, N1, M0) non-small cell lung cancer, adenocarcinoma presented with right lower lobe lung mass with a right hilar and no mediastinal lymphadenopathy and no evidence of extrathoracic disease.  The patient was diagnosed in October 2022 status post 3 cycles of neoadjuvant systemic chemotherapy with carboplatin , Alimta  and nivolumab  but unfortunately has no evidence for significant improvement of his disease and actually has some evidence for progression. The patient underwent a course of concurrent chemoradiation weekly carboplatin  for AUC of 2 and paclitaxel  45 Mg/M2.  Status post 4 cycles. The patient had a rough time with the course of concurrent chemoradiation and he was hospitalized several times during this course. Imaging studies after the course of concurrent chemoradiation showed no concerning findings  for disease progression. The patient underwent treatment with immunotherapy with Imfinzi  1500 Mg IV every 4 weeks status post 9 cycles.  This treatment was discontinued secondary to disease progression. The patient is currently on systemic chemotherapy with carboplatin , Alimta  and Keytruda  status post 12 cycles.  Starting from cycle #5 he has been on treatment with maintenance Alimta  and Keytruda  every 3 weeks.  The patient has been tolerating this treatment fairly well.  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  He had repeat CT scan of the chest performed 8 days ago but the final report is still pending.  I personally and independently reviewed the scan images and I still see the diffuse opacity in the lungs bilaterally more on the left suspicious for disease progression  rather than immunotherapy mediated pneumonitis. I recommended for the patient to have a PET scan for further evaluation of this lesion.    Metastatic Non-Small Cell Lung Cancer (NSCLC), Adenocarcinoma   Metastatic NSCLC, adenocarcinoma, initially diagnosed in October 2022 as stage IIIA. Received neoadjuvant chemotherapy with immunotherapy, followed by chemotherapy and radiation due to progression. Currently on Palliative systemic chemotherapy with carboplatin  for an AUC of 5, Alimta  500 mg/m, Keytruda  200 mg IV every 3 weeks. First dose on 05/13/2022.  Status post 12 cycles.  Starting him cycle #3 his dose of carboplatin  will be reduced to AUC of 4 and Alimta  400 Mg/M2 secondary to intolerance. Recent scans show bilateral lung opacities, more pronounced on the left, initially suspected to be immunotherapy-mediated pneumonitis. Completed prednisone  two weeks ago. Persistent opacities on recent scans raise concern for cancer progression rather than pneumonitis. Discussed PET scan to determine if opacities are cancerous. If  progression is confirmed, current chemotherapy will be deemed ineffective and a new regimen considered. Radiation therapy not recommended due to risk of lung damage from treating multiple areas. Reports well-managed fatigue, no new chest pain, dyspnea, cough, nausea, vomiting, or diarrhea.   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 Persistent lung opacities on recent scan raise concern for cancer progression rather than pneumonitis.   - Monitor for symptoms of pneumonitis and reassess based on PET scan results    General Health Maintenance   No specific issues discussed.    Follow-up   - Schedule follow-up in 2-3 weeks to review PET scan results   - Advise patient to contact radiology if he does not receive a call for PET scan scheduling.   The patient was advised to call immediately if he has any other concerning symptoms in the interval. The patient voices understanding of current disease status and treatment options and is in agreement with the current care plan.  All questions were answered. The patient knows to call the clinic with any problems, questions or concerns. We can certainly see the patient much sooner if necessary.   Disclaimer: This note was dictated with voice recognition software. Similar sounding words can inadvertently be transcribed and may not be corrected upon review.

## 2023-03-20 ENCOUNTER — Encounter (HOSPITAL_COMMUNITY)
Admission: RE | Admit: 2023-03-20 | Discharge: 2023-03-20 | Disposition: A | Discharge status: Home / Self Care | Payer: Medicare Other | Source: Ambulatory Visit | Attending: Internal Medicine | Admitting: Internal Medicine

## 2023-03-20 DIAGNOSIS — I723 Aneurysm of iliac artery: Secondary | ICD-10-CM | POA: Diagnosis not present

## 2023-03-20 DIAGNOSIS — I2510 Atherosclerotic heart disease of native coronary artery without angina pectoris: Secondary | ICD-10-CM | POA: Diagnosis not present

## 2023-03-20 DIAGNOSIS — C3490 Malignant neoplasm of unspecified part of unspecified bronchus or lung: Secondary | ICD-10-CM | POA: Insufficient documentation

## 2023-03-20 DIAGNOSIS — J439 Emphysema, unspecified: Secondary | ICD-10-CM | POA: Insufficient documentation

## 2023-03-20 DIAGNOSIS — I700 Atherosclerosis of aorta: Secondary | ICD-10-CM | POA: Insufficient documentation

## 2023-03-20 DIAGNOSIS — C3431 Malignant neoplasm of lower lobe, right bronchus or lung: Secondary | ICD-10-CM | POA: Diagnosis not present

## 2023-03-20 LAB — GLUCOSE, CAPILLARY: Glucose-Capillary: 160 mg/dL — ABNORMAL HIGH (ref 70–99)

## 2023-03-20 MED ORDER — FLUDEOXYGLUCOSE F - 18 (FDG) INJECTION
8.7000 | Freq: Once | INTRAVENOUS | Status: AC | PRN
Start: 2023-03-20 — End: 2023-03-20
  Administered 2023-03-20: 9.6 via INTRAVENOUS

## 2023-03-20 MED ORDER — FLUDEOXYGLUCOSE F - 18 (FDG) INJECTION: 9.6000 | Freq: Once | INTRAVENOUS | Status: DC | PRN

## 2023-04-02 ENCOUNTER — Inpatient Hospital Stay: Payer: Medicare Other

## 2023-04-02 ENCOUNTER — Inpatient Hospital Stay: Payer: Medicare Other | Admitting: Internal Medicine

## 2023-04-02 VITALS — BP 111/87 | HR 96 | Temp 97.5°F | Resp 16 | Ht 72.0 in | Wt 192.2 lb

## 2023-04-02 DIAGNOSIS — C3491 Malignant neoplasm of unspecified part of right bronchus or lung: Secondary | ICD-10-CM

## 2023-04-02 DIAGNOSIS — Z79899 Other long term (current) drug therapy: Secondary | ICD-10-CM | POA: Diagnosis not present

## 2023-04-02 DIAGNOSIS — C778 Secondary and unspecified malignant neoplasm of lymph nodes of multiple regions: Secondary | ICD-10-CM | POA: Diagnosis not present

## 2023-04-02 DIAGNOSIS — Z5112 Encounter for antineoplastic immunotherapy: Secondary | ICD-10-CM | POA: Diagnosis not present

## 2023-04-02 DIAGNOSIS — Z5111 Encounter for antineoplastic chemotherapy: Secondary | ICD-10-CM | POA: Diagnosis not present

## 2023-04-02 DIAGNOSIS — Z5189 Encounter for other specified aftercare: Secondary | ICD-10-CM | POA: Diagnosis not present

## 2023-04-02 DIAGNOSIS — C3431 Malignant neoplasm of lower lobe, right bronchus or lung: Secondary | ICD-10-CM | POA: Diagnosis not present

## 2023-04-02 LAB — CMP (CANCER CENTER ONLY)
ALT: 12 U/L (ref 0–44)
AST: 18 U/L (ref 15–41)
Albumin: 4.1 g/dL (ref 3.5–5.0)
Alkaline Phosphatase: 50 U/L (ref 38–126)
Anion gap: 10 (ref 5–15)
BUN: 12 mg/dL (ref 8–23)
CO2: 27 mmol/L (ref 22–32)
Calcium: 9.3 mg/dL (ref 8.9–10.3)
Chloride: 101 mmol/L (ref 98–111)
Creatinine: 1.08 mg/dL (ref 0.61–1.24)
GFR, Estimated: >60 mL/min (ref >60)
Glucose, Bld: 130 mg/dL — ABNORMAL HIGH (ref 70–99)
Potassium: 3.8 mmol/L (ref 3.5–5.1)
Sodium: 138 mmol/L (ref 135–145)
Total Bilirubin: 0.4 mg/dL (ref 0.0–1.2)
Total Protein: 7.2 g/dL (ref 6.5–8.1)

## 2023-04-02 LAB — CBC WITH DIFFERENTIAL (CANCER CENTER ONLY)
Abs Immature Granulocytes: 0.02 10*3/uL (ref 0.00–0.07)
Basophils Absolute: 0.1 10*3/uL (ref 0.0–0.1)
Basophils Relative: 1 %
Eosinophils Absolute: 0.3 10*3/uL (ref 0.0–0.5)
Eosinophils Relative: 4 %
HCT: 37.6 % — ABNORMAL LOW (ref 39.0–52.0)
Hemoglobin: 12.8 g/dL — ABNORMAL LOW (ref 13.0–17.0)
Immature Granulocytes: 0 %
Lymphocytes Relative: 29 %
Lymphs Abs: 2.2 10*3/uL (ref 0.7–4.0)
MCH: 32.2 pg (ref 26.0–34.0)
MCHC: 34 g/dL (ref 30.0–36.0)
MCV: 94.5 fL (ref 80.0–100.0)
Monocytes Absolute: 0.6 10*3/uL (ref 0.1–1.0)
Monocytes Relative: 8 %
Neutro Abs: 4.5 10*3/uL (ref 1.7–7.7)
Neutrophils Relative %: 58 %
Platelet Count: 169 10*3/uL (ref 150–400)
RBC: 3.98 MIL/uL — ABNORMAL LOW (ref 4.22–5.81)
RDW: 12.1 % (ref 11.5–15.5)
WBC Count: 7.7 10*3/uL (ref 4.0–10.5)
nRBC: 0 % (ref 0.0–0.2)

## 2023-04-02 LAB — TSH: TSH: 1.755 u[IU]/mL (ref 0.350–4.500)

## 2023-04-02 MED ORDER — DEXAMETHASONE 4 MG PO TABS: ORAL_TABLET | ORAL | 0 refills | Status: DC

## 2023-04-02 NOTE — Progress Notes (Signed)
[facility] [phone]   [phone]  OFFICE PROGRESS NOTE  [name], MD [address] [place] [address]  DIAGNOSIS: Metastatic non-small cell lung cancer initially diagnosed as stage IIIA (T4, N0, M0) non-small cell lung cancer, favoring adenocarcinoma presented with large right lower lobe lung mass with no mediastinal lymphadenopathy or extrathoracic metastasis diagnosed in October 2022. Had disease progression with enlarging mass and new right hilar and mediastinal lymph nodes in December 2022.      PRIOR THERAPY:  1) Neoadjuvant treatment with carboplatin for AUC of 5, Alimta 500 Mg/M2 and nivolumab 360 Mg IV every 3 weeks.  First dose January 23, 2021.  Status post 3 cycles. 2) Concurrent chemoradiation with carboplatin for an AUC of 2 and paclitaxel 45 mg per metered squared.  First dose expected on 04/01/2021.  Status post 4 cycles 3) Consolidation treatment with immunotherapy with Imfinzi 1500 Mg IV every 4 weeks.  First dose on Jul 08, 2021.  Status post 9 cycles.  4) Palliative systemic chemotherapy with carboplatin for an AUC of 5, Alimta 500 mg/m, Keytruda 200 mg IV every 3 weeks. First dose on 05/13/2022.  Status post 12 cycles.  Starting him cycle #3 his dose of carboplatin will be reduced to AUC of 4 and Alimta 400 Mg/M2 secondary to intolerance.  Last dose was given January 26, 2023 discontinued secondary to disease progression.    CURRENT THERAPY: Second line systemic chemotherapy with docetaxel 65 Mg/M2 and ramucirumab 10 Mg/KG every 3 weeks with Neulasta support.  First dose April 08, 2023.  INTERVAL HISTORY: Brandon Rubio. 73 y.o. male returns to the clinic today for follow-up visit.Discussed the use of AI scribe software for clinical note transcription with the patient, who gave verbal consent to proceed.  History of Present Illness   Brandon Rubio, a soon-to-be 73 year old patient, was initially  diagnosed with stage 3A non-small cell lung cancer, adenocarcinoma, in October 2022. He underwent neoadjuvant treatment at that time. However, recent PET scan results showed activity in multiple areas of both the left and right lungs, raising concerns of disease progression. The current regimen of Alimta and Keytruda appears to be ineffective.  The patient was informed about the option of a two-drug chemotherapy regimen, including docetaxel and ramucirumab, administered every three weeks. This treatment requires an additional visit two days post-infusion for a white blood cell growth factor injection to counteract the significant drop in white blood cell count caused by docetaxel. The patient was also informed about the potential side effects of this regimen, including hair loss, changes in taste, and a decrease in white blood cell count. There is also a risk of bleeding due to ramucirumab, which can manifest as nose bleeds, gum bleeds, hemorrhoidal bleeds, or even coughing up blood.  The patient was also prescribed a steroid, Decadron, to be taken for three days - the day before, the day of, and the day after chemotherapy. He was already on a low dose of prednisone, prescribed by another doctor, which he was advised to continue taking. The patient was scheduled to start the new treatment regimen the following week.       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  MEDICATIONS:  Current Outpatient Medications   Medication Sig Dispense Refill   aspirin EC 81 MG tablet Take 81 mg by mouth in the morning. Swallow whole.     bisacodyl (DULCOLAX) 5 MG EC tablet Take 5 mg by mouth daily as needed for moderate constipation.     folic acid (FOLVITE) 1 MG tablet TAKE 1 TABLET BY MOUTH EVERY DAY 30 tablet 2   HYDROcodone-acetaminophen (NORCO) 10-325 MG tablet Take 1 tablet by mouth every 12 (twelve) hours as needed. 60 tablet 0   Magnesium Gluconate 550 MG TABS Take 1 tablet (550 mg total) by mouth 2 (two) times daily. 60 tablet 3   metFORMIN (GLUCOPHAGE) 1000 MG tablet TAKE 1 TABLET(1000 MG) BY MOUTH TWICE DAILY WITH A MEAL 180 tablet 1   metoprolol tartrate (LOPRESSOR) 25 MG tablet Take 1 tablet (25 mg total) by mouth 2 (two) times daily. 60 tablet 3   Multiple Vitamins-Minerals (CENTRUM SILVER 50+MEN) TABS Take 1 tablet by mouth daily with breakfast.     omeprazole (PRILOSEC) 20 MG capsule TAKE 1 CAPSULE BY MOUTH TWICE DAILY BEFORE A MEAL 180 capsule 1   ondansetron (ZOFRAN) 8 MG tablet Take 1 tablet (8 mg total) by mouth every 8 (eight) hours as needed for nausea or vomiting. 30 tablet 2   predniSONE (DELTASONE) 20 MG tablet Take 4 tablets (80 mg) daily in the morning for 1 week, followed by 3 tablets (60mg ) daily for 7 days, followed by 2 tablets (40 mg) daily for 1 week, followed by 1 tablet (20 mg) daily for 1 week, followed by 1/2 a tablet (10 mg) daily for 1 week. Then return to your usual prednisone dose (5 mg alternating with 7.5 mg) 74 tablet 0   predniSONE (DELTASONE) 5 MG tablet Take 1-1.5 tablets (5-7.5 mg total) by mouth daily with breakfast. (Patient taking differently: Take 7.5 mg by mouth daily with breakfast.) 135 tablet 3   prochlorperazine (COMPAZINE) 10 MG tablet TAKE 1 TABLET(10 MG) BY MOUTH EVERY 6 HOURS FOR UP TO 14 DAYS AS NEEDED FOR NAUSEA OR VOMITING 60 tablet 1   simvastatin (ZOCOR) 40 MG tablet TAKE 1 TABLET BY MOUTH EVERY EVENING AT 6 PM. (Patient taking differently: Take 40 mg by  mouth in the morning.) 90 tablet 3   No current facility-administered medications for this visit.    SURGICAL HISTORY:  Past Surgical History:  Procedure Laterality Date   BRONCHIAL BIOPSY  12/10/2020   Procedure: BRONCHIAL BIOPSIES;  Surgeon: Leslye Peer, MD;  Location: [facility] ENDOSCOPY;  Service: Pulmonary;;   BRONCHIAL BRUSHINGS  12/10/2020   Procedure: BRONCHIAL BRUSHINGS;  Surgeon: Leslye Peer, MD;  Location: [facility] ENDOSCOPY;  Service: Pulmonary;;   BRONCHIAL NEEDLE ASPIRATION BIOPSY  12/10/2020   Procedure: BRONCHIAL NEEDLE ASPIRATION BIOPSIES;  Surgeon: Leslye Peer, MD;  Location[facility] ENDOSCOPY;  Service: Pulmonary;;   COLONOSCOPY     COLONOSCOPY W/ POLYPECTOMY  03/10/2009   LARYNGOSCOPY  03/10/1997   VIDEO BRONCHOSCOPY WITH ENDOBRONCHIAL NAVIGATION N/A 12/10/2020   Procedure: ROBOTIC VIDEO BRONCHOSCOPY WITH ENDOBRONCHIAL NAVIGATION;  Surgeon: Leslye Peer, MD;  Location: [facility] ENDOSCOPY;  Service: Pulmonary;  Laterality: N/A;    REVIEW  OF SYSTEMS:  Constitutional: positive for fatigue Eyes: negative Ears, nose, mouth, throat, and face: negative Respiratory: positive for dyspnea on exertion Cardiovascular: negative Gastrointestinal: negative Genitourinary:negative Integument/breast: negative Hematologic/lymphatic: negative Musculoskeletal:negative Neurological: negative Behavioral/Psych: negative Endocrine: negative Allergic/Immunologic: negative   PHYSICAL EXAMINATION: General appearance: alert, cooperative, fatigued, and no distress Head: Normocephalic, without obvious abnormality, atraumatic Neck: no adenopathy, no JVD, supple, symmetrical, trachea midline, and thyroid not enlarged, symmetric, no tenderness/mass/nodules Lymph nodes: Cervical, supraclavicular, and axillary nodes normal. Resp: clear to auscultation bilaterally Back: symmetric, no curvature. ROM normal. No CVA tenderness. Cardio: regular rate and rhythm, S1, S2 normal, no murmur, click, rub or  gallop GI: soft, non-tender; bowel sounds normal; no masses,  no organomegaly Extremities: extremities normal, atraumatic, no cyanosis or edema Neurologic: Alert and oriented X 3, normal strength and tone. Normal symmetric reflexes. Normal coordination and gait  ECOG PERFORMANCE STATUS: 1 - Symptomatic but completely ambulatory  Blood pressure 111/87, pulse 96, temperature (!) 97.5 F (36.4 C), temperature source Temporal, resp. rate 16, height 6' (1.829 m), weight 192 lb 3.2 oz (87.2 kg), SpO2 (!) 71%.  LABORATORY DATA: Lab Results  Component Value Date   WBC 7.7 04/02/2023   HGB 12.8 (L) 04/02/2023   HCT 37.6 (L) 04/02/2023   MCV 94.5 04/02/2023   PLT 169 04/02/2023      Chemistry      Component Value Date/Time   NA 138 04/02/2023 0931   NA 137 10/24/2022 1540   K 3.8 04/02/2023 0931   CL 101 04/02/2023 0931   CO2 27 04/02/2023 0931   BUN 12 04/02/2023 0931   BUN 9 10/24/2022 1540   CREATININE 1.08 04/02/2023 0931      Component Value Date/Time   CALCIUM 9.3 04/02/2023 0931   ALKPHOS 50 04/02/2023 0931   AST 18 04/02/2023 0931   ALT 12 04/02/2023 0931   BILITOT 0.4 04/02/2023 0931       RADIOGRAPHIC STUDIES: NM PET Image Restage (PS) Skull Base to Thigh (F-18 FDG) Result Date: 03/30/2023 CLINICAL DATA:  Subsequent treatment strategy for non-small cell lung cancer. EXAM: NUCLEAR MEDICINE PET SKULL BASE TO THIGH TECHNIQUE: 9.6 mCi F-18 FDG was injected intravenously. Full-ring PET imaging was performed from the skull base to thigh after the radiotracer. CT data was obtained and used for attenuation correction and anatomic localization. Fasting blood glucose: 160 mg/dl COMPARISON:  CT chest 16/12/9602, CT chest abdomen pelvis 01/21/2023, PET 04/07/2022. FINDINGS: Mediastinal blood pool activity: SUV max 2.8 Liver activity: SUV max NA NECK: No abnormal hypermetabolism. Incidental CT findings: None. CHEST: Peribronchovascular subsolid nodules and masses bilaterally. Dominant  left lower lobe mass measures 3.2 x 4.0 cm, SUV max 6.9, enlarged slightly from 3.4 x 3.5 cm on 01/21/2023. Findings are progressive from PET 04/07/2022. Partially calcified consolidation in the posterior right lower lobe, increasingly organized in appearance, indicative of prior treatment. Incidental CT findings: Coronary artery calcification. Heart is at the upper limits of normal in size to mildly enlarged. No pericardial effusion. Trace right pleural fluid. Centrilobular and paraseptal emphysema. ABDOMEN/PELVIS: No abnormal hypermetabolism. Incidental CT findings: Low-attenuation left renal lesion. No specific follow-up necessary. Gastric wall thickening. Atherosclerotic calcification of the aorta. 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 SKELETON: No abnormal hypermetabolism. Incidental CT findings: Degenerative changes in the spine. IMPRESSION: 1. Multifocal adenocarcinoma, progressive from comparison examinations. 2. Presumed evolving post treatment changes in the right lower lobe. 3. Chronic gastric wall thickening. 4. Aortic atherosclerosis (ICD10-I70.0). Coronary artery calcification. Distal right common iliac  artery aneurysm. 5.  Emphysema (ICD10-J43.9). Electronically Signed   By: Leanna Battles M.D.   On: 03/30/2023 16:14   CT Chest W Contrast Result Date: 03/17/2023 CLINICAL DATA:  Non-small cell lung cancer (NSCLC), non-metastatic, assess treatment response. * Tracking Code: BO * EXAM: CT CHEST WITH CONTRAST TECHNIQUE: Multidetector CT imaging of the chest was performed during intravenous contrast administration. RADIATION DOSE REDUCTION: This exam was performed according to the departmental dose-optimization program which includes automated exposure control, adjustment of the mA and/or kV according to patient size and/or use of iterative reconstruction technique. CONTRAST:  75mL OMNIPAQUE IOHEXOL 300 MG/ML  SOLN COMPARISON:  CT scan chest from 01/21/2023. FINDINGS:  Cardiovascular: Normal cardiac size. No pericardial effusion. No aortic aneurysm. There are coronary artery calcifications, in keeping with coronary artery disease. There are also mild peripheral atherosclerotic vascular calcifications of thoracic aorta and its major branches. Mediastinum/Nodes: Visualized thyroid gland appears grossly unremarkable. No solid / cystic mediastinal masses. The esophagus is nondistended precluding optimal assessment. No axillary, mediastinal or hilar lymphadenopathy by size criteria. Lungs/Pleura: The central tracheo-bronchial tree is patent. Redemonstration of upper lobe predominant mild-to-moderate emphysematous changes. There is redemonstration of partial collapse of the right lower lobe with associated bronchiectatic changes and hyperdense material, which may represent aspiration. There is continued gradual interval increase in the density of multiple predominantly peribronchovascular opacities throughout bilateral lungs. When compared to prior 2 exams, the lesion appears more solid than ground-glass and also show slow interval increase in size. Many individual lesions exhibits slow growth over time and eventually merge and forms a confluent mass. The appearance can be seen with immune check point inhibitor therapy related pneumonitis. Correlate clinically. No new mass or consolidation.  No pleural effusion or pneumothorax. Upper Abdomen: Apparent gastric wall thickening appears grossly similar to the prior studies. There is a partially imaged simple cyst arising from the left kidney upper pole. Remaining visualized upper abdominal viscera are within normal limits. Musculoskeletal: The visualized soft tissues of the chest wall are grossly unremarkable. No suspicious osseous lesions. There are mild multilevel degenerative changes in the visualized spine. IMPRESSION: 1. Continued gradual interval increase in the density and size of multiple bilateral predominantly peribronchovascular  opacities, as described above, which can be seen with immune checkpoint inhibitor therapy related pneumonitis. Correlate clinically. 2. Multiple other nonacute observations, as described above. Aortic Atherosclerosis (ICD10-I70.0) and Emphysema (ICD10-J43.9). Electronically Signed   By: Jules Schick M.D.   On: 03/17/2023 08:50    ASSESSMENT AND PLAN: This is a very pleasant 73 years old white male with history of a stage IIIA (T4, N1, M0) non-small cell lung cancer, adenocarcinoma presented with right lower lobe lung mass with a right hilar and no mediastinal lymphadenopathy and no evidence of extrathoracic disease.  The patient was diagnosed in October 2022 status post 3 cycles of neoadjuvant systemic chemotherapy with carboplatin, Alimta and nivolumab but unfortunately has no evidence for significant improvement of his disease and actually has some evidence for progression. The patient underwent a course of concurrent chemoradiation weekly carboplatin for AUC of 2 and paclitaxel 45 Mg/M2.  Status post 4 cycles. The patient had a rough time with the course of concurrent chemoradiation and he was hospitalized several times during this course. Imaging studies after the course of concurrent chemoradiation showed no concerning findings for disease progression. The patient underwent treatment with immunotherapy with Imfinzi 1500 Mg IV every 4 weeks status post 9 cycles.  This treatment was discontinued secondary to disease progression. The patient  is currently on systemic chemotherapy with carboplatin, Alimta and Keytruda status post 12 cycles.  Starting from cycle #5 he has been on treatment with maintenance Alimta and Keytruda every 3 weeks.  The patient has been tolerating this treatment fairly well.  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  Repeat CT scan of the chest showed the diffuse  opacity in the lungs bilaterally more on the left suspicious for disease progression rather than immunotherapy mediated pneumonitis. The patient had a PET scan performed recently and it showed multifocal adenocarcinoma progressive from comparison examination.    Metastatic Non-Small Cell Lung Cancer (NSCLC) - Adenocarcinoma Metastatic NSCLC, adenocarcinoma, initially diagnosed in October 2022 as stage IIIA. Recent PET scan indicates disease progression in both lungs. Current treatment with Alimta and Keytruda is ineffective. Not a candidate for further radiation due to prior treatments and bilateral disease. Discussed alternative chemotherapy with docetaxel and ramucirumab, noting increased side effects (hair loss, nail changes, fatigue, bleeding issues). Emphasized close monitoring due to potential serious side effects. Decided to reduce initial docetaxel dose to 65 mg/m to minimize bone marrow suppression. Informed about the necessity of a white blood cell growth factor injection two days post-chemotherapy. Discussed Decadron (dexamethasone) 4 mg tablets, to be taken two in the morning and two in the evening, the day before, the day of, and the day after chemotherapy. Agreed to discontinue prednisone during Decadron administration. - Initiate chemotherapy with docetaxel and ramucirumab every three weeks. - Administer white blood cell growth factor injection two days post-chemotherapy. - Monitor for side effects: hair loss, nail changes, fatigue, bleeding issues (epistaxis, gum bleeding, hemorrhoidal bleeding, hemoptysis). - Reduce initial docetaxel dose to 65 mg/m. - Prescribe Decadron 4 mg tablets, two in the morning and two in the evening, the day before, the day of, and the day after chemotherapy. - Discontinue prednisone during Decadron administration. - Schedule chemotherapy to start next Wednesday, with the white blood cell growth factor injection on Friday.  Follow-up - Start chemotherapy on  Wednesday, April 08, 2023. - Administer white blood cell growth factor injection on Friday, April 10, 2023. - Ensure Decadron administration starting Tuesday, April 07, 2023, for three days.   The patient was advised to call immediately if she has any other concerning symptoms in the interval. The patient voices understanding of current disease status and treatment options and is in agreement with the current care plan.  All questions were answered. The patient knows to call the clinic with any problems, questions or concerns. We can certainly see the patient much sooner if necessary.   Disclaimer: This note was dictated with voice recognition software. Similar sounding words can inadvertently be transcribed and may not be corrected upon review.

## 2023-04-02 NOTE — Progress Notes (Signed)
DISCONTINUE ON PATHWAY REGIMEN - Non-Small Cell Lung     A cycle is every 21 days:     Pembrolizumab      Pemetrexed      Carboplatin   **Always confirm dose/schedule in your pharmacy ordering system**  PRIOR TREATMENT: [id number]: Pembrolizumab 200 mg + Pemetrexed 500 mg/m2 + Carboplatin AUC=5 q21 Days x 4 Cycles  START ON PATHWAY REGIMEN - Non-Small Cell Lung     A cycle is every 21 days:     Ramucirumab      Docetaxel   **Always confirm dose/schedule in your pharmacy ordering system**  Patient Characteristics: Stage IV Metastatic, Nonsquamous, Molecular Analysis Completed, Molecular Alteration Present and Targeted Therapy Exhausted OR KRAS G12C+ or HER2+ Present and No Prior Chemo/Immunotherapy OR No Alteration Present, Second Line -  Chemotherapy/Immunotherapy, PS = 0, 1, No Prior PD-1/PD-L1  Inhibitor or Prior PD-1/PD-L1 Inhibitor + Chemotherapy, and Not a Candidate for Immunotherapy Therapeutic Status: Stage IV Metastatic Histology: Nonsquamous Cell Broad Molecular Profiling Status: Animal nutritionist Analysis Results: No Alteration Present ECOG Performance Status: 1 Chemotherapy/Immunotherapy Line of Therapy: Second Line Chemotherapy/Immunotherapy Immunotherapy Candidate Status: Not a Candidate for Immunotherapy Prior Immunotherapy Status: Prior PD-1/PD-L1 Inhibitor + Chemotherapy Intent of Therapy: Non-Curative / Palliative Intent, Discussed with Patient

## 2023-04-03 ENCOUNTER — Encounter: Payer: Self-pay | Admitting: Internal Medicine

## 2023-04-03 LAB — T4: T4, Total: 10.8 ug/dL (ref 4.5–12.0)

## 2023-04-06 ENCOUNTER — Telehealth: Payer: Self-pay | Admitting: Internal Medicine

## 2023-04-08 ENCOUNTER — Inpatient Hospital Stay: Payer: Medicare Other

## 2023-04-08 ENCOUNTER — Other Ambulatory Visit: Payer: Self-pay | Admitting: Internal Medicine

## 2023-04-08 ENCOUNTER — Inpatient Hospital Stay (HOSPITAL_BASED_OUTPATIENT_CLINIC_OR_DEPARTMENT_OTHER): Payer: Medicare Other | Admitting: Physician Assistant

## 2023-04-08 VITALS — BP 120/70 | HR 85 | Resp 18

## 2023-04-08 VITALS — BP 120/72 | HR 81 | Temp 98.2°F | Resp 18 | Wt 192.5 lb

## 2023-04-08 DIAGNOSIS — T50905A Adverse effect of unspecified drugs, medicaments and biological substances, initial encounter: Secondary | ICD-10-CM

## 2023-04-08 DIAGNOSIS — Z5189 Encounter for other specified aftercare: Secondary | ICD-10-CM | POA: Diagnosis not present

## 2023-04-08 DIAGNOSIS — C3491 Malignant neoplasm of unspecified part of right bronchus or lung: Secondary | ICD-10-CM

## 2023-04-08 DIAGNOSIS — Z79899 Other long term (current) drug therapy: Secondary | ICD-10-CM | POA: Diagnosis not present

## 2023-04-08 DIAGNOSIS — C778 Secondary and unspecified malignant neoplasm of lymph nodes of multiple regions: Secondary | ICD-10-CM | POA: Diagnosis not present

## 2023-04-08 DIAGNOSIS — Z5111 Encounter for antineoplastic chemotherapy: Secondary | ICD-10-CM | POA: Diagnosis not present

## 2023-04-08 DIAGNOSIS — Z5112 Encounter for antineoplastic immunotherapy: Secondary | ICD-10-CM | POA: Diagnosis not present

## 2023-04-08 DIAGNOSIS — C3431 Malignant neoplasm of lower lobe, right bronchus or lung: Secondary | ICD-10-CM | POA: Diagnosis not present

## 2023-04-08 LAB — CBC WITH DIFFERENTIAL (CANCER CENTER ONLY)
Abs Immature Granulocytes: 0.06 K/uL (ref 0.00–0.07)
Basophils Absolute: 0 K/uL (ref 0.0–0.1)
Basophils Relative: 0 %
Eosinophils Absolute: 0 K/uL (ref 0.0–0.5)
Eosinophils Relative: 0 %
HCT: 35.5 % — ABNORMAL LOW (ref 39.0–52.0)
Hemoglobin: 12.3 g/dL — ABNORMAL LOW (ref 13.0–17.0)
Immature Granulocytes: 1 %
Lymphocytes Relative: 5 %
Lymphs Abs: 0.7 K/uL (ref 0.7–4.0)
MCH: 31.5 pg (ref 26.0–34.0)
MCHC: 34.6 g/dL (ref 30.0–36.0)
MCV: 91 fL (ref 80.0–100.0)
Monocytes Absolute: 0.4 K/uL (ref 0.1–1.0)
Monocytes Relative: 3 %
Neutro Abs: 11.2 K/uL — ABNORMAL HIGH (ref 1.7–7.7)
Neutrophils Relative %: 91 %
Platelet Count: 200 K/uL (ref 150–400)
RBC: 3.9 MIL/uL — ABNORMAL LOW (ref 4.22–5.81)
RDW: 12.1 % (ref 11.5–15.5)
WBC Count: 12.4 K/uL — ABNORMAL HIGH (ref 4.0–10.5)
nRBC: 0 % (ref 0.0–0.2)

## 2023-04-08 LAB — CMP (CANCER CENTER ONLY)
ALT: 12 U/L (ref 0–44)
AST: 15 U/L (ref 15–41)
Albumin: 4.2 g/dL (ref 3.5–5.0)
Alkaline Phosphatase: 48 U/L (ref 38–126)
Anion gap: 12 (ref 5–15)
BUN: 17 mg/dL (ref 8–23)
CO2: 22 mmol/L (ref 22–32)
Calcium: 9.3 mg/dL (ref 8.9–10.3)
Chloride: 104 mmol/L (ref 98–111)
Creatinine: 1.06 mg/dL (ref 0.61–1.24)
GFR, Estimated: >60 mL/min
Glucose, Bld: 176 mg/dL — ABNORMAL HIGH (ref 70–99)
Potassium: 4.2 mmol/L (ref 3.5–5.1)
Sodium: 138 mmol/L (ref 135–145)
Total Bilirubin: 0.4 mg/dL (ref 0.0–1.2)
Total Protein: 7.4 g/dL (ref 6.5–8.1)

## 2023-04-08 LAB — TOTAL PROTEIN, URINE DIPSTICK: Protein, ur: NEGATIVE mg/dL

## 2023-04-08 LAB — TSH: TSH: 0.494 u[IU]/mL (ref 0.350–4.500)

## 2023-04-08 MED ORDER — METHYLPREDNISOLONE SODIUM SUCC 125 MG IJ SOLR
125.0000 mg | Freq: Once | INTRAMUSCULAR | Status: AC | PRN
  Administered 2023-04-08: 40 mg via INTRAVENOUS

## 2023-04-08 MED ORDER — SODIUM CHLORIDE 0.9 % IV SOLN
10.0000 mg/kg | Freq: Once | INTRAVENOUS | Status: AC
  Administered 2023-04-08: 900 mg via INTRAVENOUS
  Filled 2023-04-08: qty 40

## 2023-04-08 MED ORDER — DEXAMETHASONE SODIUM PHOSPHATE 10 MG/ML IJ SOLN
10.0000 mg | Freq: Once | INTRAMUSCULAR | Status: AC
  Administered 2023-04-08: 10 mg via INTRAVENOUS
  Filled 2023-04-08: qty 1

## 2023-04-08 MED ORDER — DIPHENHYDRAMINE HCL 50 MG/ML IJ SOLN
50.0000 mg | Freq: Once | INTRAMUSCULAR | Status: AC
  Administered 2023-04-08: 50 mg via INTRAVENOUS
  Filled 2023-04-08: qty 1

## 2023-04-08 MED ORDER — FAMOTIDINE IN NACL 20-0.9 MG/50ML-% IV SOLN
20.0000 mg | Freq: Once | INTRAVENOUS | Status: AC | PRN
  Administered 2023-04-08: 20 mg via INTRAVENOUS

## 2023-04-08 MED ORDER — SODIUM CHLORIDE 0.9 % IV SOLN
65.0000 mg/m2 | Freq: Once | INTRAVENOUS | Status: AC
  Administered 2023-04-08: 137 mg via INTRAVENOUS
  Filled 2023-04-08: qty 13.7

## 2023-04-08 MED ORDER — SODIUM CHLORIDE 0.9 % IV SOLN: INTRAVENOUS | Status: DC

## 2023-04-08 MED ORDER — ACETAMINOPHEN 325 MG PO TABS
650.0000 mg | ORAL_TABLET | Freq: Once | ORAL | Status: AC
  Administered 2023-04-08: 650 mg via ORAL
  Filled 2023-04-08: qty 2

## 2023-04-08 MED ORDER — METHYLPREDNISOLONE SODIUM SUCC 125 MG IJ SOLR
125.0000 mg | Freq: Once | INTRAMUSCULAR | Status: AC
Start: 2023-04-08 — End: 2023-04-08
  Administered 2023-04-08: 85 mg via INTRAVENOUS

## 2023-04-08 NOTE — Progress Notes (Signed)
1845- Pt refused to stay longer to complete treatment.  Approx 50ml left in bag.  Pt line was flushed and PIV taken out.  Pt walked out to lobby in stable condition

## 2023-04-08 NOTE — Progress Notes (Unsigned)
    DATE:  04/08/23                                        X CHEMO/IMMUNOTHERAPY REACTION              MD: Shirline Frees    AGENT/BLOOD PRODUCT RECEIVING TODAY:              cyramza and taxotere   AGENT/BLOOD PRODUCT RECEIVING IMMEDIATELY PRIOR TO REACTION:          cyramza    Vitals:   04/08/23 1321 04/08/23 1328  BP: 120/70 120/70  Pulse: 83 85  Resp: 18 18  SpO2: 96% 97%      REACTION(S):           pruritus    PREMEDS:      Decadron 10 mg IV, Benadryl 50 mg IV, Tylenol 650 mg PO  INTERVENTION: Pepcid 20 mg IV, solu-medrol 125 mg IV   Review of Systems  Review of Systems  Constitutional:        Pruritus   HENT:  Negative for trouble swallowing.   Respiratory:  Negative for shortness of breath.   Cardiovascular:  Negative for chest pain.  Skin:  Negative for rash.     Physical Exam  Physical Exam Vitals and nursing note reviewed.  Constitutional:      Appearance: He is not ill-appearing or toxic-appearing.  HENT:     Head: Normocephalic.  Eyes:     Conjunctiva/sclera: Conjunctivae normal.  Cardiovascular:     Rate and Rhythm: Normal rate and regular rhythm.     Pulses: Normal pulses.     Heart sounds: Normal heart sounds.  Pulmonary:     Effort: Pulmonary effort is normal.     Breath sounds: Normal breath sounds.  Abdominal:     General: There is no distension.  Musculoskeletal:     Cervical back: Normal range of motion.  Skin:    General: Skin is warm and dry.     Findings: No rash. Rash is not urticarial.  Neurological:     Mental Status: He is alert.     OUTCOME:                Patient became symptomatic 20 minutes into first time Cyramza infusion. Emergency medications were administered as documented above. Patient returned to baseline. Oncologist notified and agrees to resume treatment. Patient tolerated remainder of treatment.

## 2023-04-08 NOTE — Progress Notes (Signed)
Hypersensitivity Reaction note  Date of event: 04/08/23 Time of event: 1314 Generic name of drug involved: Cyramza Name of provider notified of the hypersensitivity reaction: Jae Dire, [place] & Dr Arbutus Ped Was agent that likely caused hypersensitivity reaction added to Allergies List within EMR? No, see note Chain of events including reaction signs/symptoms, treatment administered, and outcome (e.g., drug resumed; drug discontinued; sent to Emergency Department; etc.) 1314: Patient notified this RN that he was itching, all over body. This RN stopped Cyramza infusion at that time and contacted [place], [place]. No difficulty breathing per patient, no signs of hives on body. 1318: Pepcid administered IV. Itching continued. 1326: 40 mg solu-medrol administered. Per patient, itching still present, no changes 1335: 85 mg solu-medrol administered per Jae Dire. Patient states itching still present but slight improvement. This RN continued to monitor patient. 1416: Per patient, symptoms "75% better", Cyramza re-started at slower rate, 125 mg per direction of Dr Arbutus Ped.  Patient tolerated remainder of treatment well with no complaints.  Gailen Shelter, RN 04/08/2023 5:12 PM

## 2023-04-08 NOTE — Progress Notes (Signed)
Ok to start treatment without Urine protein results per Dr. Arbutus Ped.

## 2023-04-08 NOTE — Patient Instructions (Signed)
[facility] - A DEPT OF [facility]  Discharge Instructions: Thank you for choosing [facility] to provide your oncology and hematology care.   If you have a lab appointment with the Cancer Center, please go directly to the Cancer Center and check in at the registration area.   Wear comfortable clothing and clothing appropriate for easy access to any Portacath or PICC line.   We strive to give you quality time with your provider. You may need to reschedule your appointment if you arrive late (15 or more minutes).  Arriving late affects you and other patients whose appointments are after yours.  Also, if you miss three or more appointments without notifying the office, you may be dismissed from the clinic at the provider's discretion.      For prescription refill requests, have your pharmacy contact our office and allow 72 hours for refills to be completed.    Today you received the following chemotherapy and/or immunotherapy agents: Cyramza, Taxotere      To help prevent nausea and vomiting after your treatment, we encourage you to take your nausea medication as directed.  BELOW ARE SYMPTOMS THAT SHOULD BE REPORTED IMMEDIATELY: *FEVER GREATER THAN 100.4 F (38 C) OR HIGHER *CHILLS OR SWEATING *NAUSEA AND VOMITING THAT IS NOT CONTROLLED WITH YOUR NAUSEA MEDICATION *UNUSUAL SHORTNESS OF BREATH *UNUSUAL BRUISING OR BLEEDING *URINARY PROBLEMS (pain or burning when urinating, or frequent urination) *BOWEL PROBLEMS (unusual diarrhea, constipation, pain near the anus) TENDERNESS IN MOUTH AND THROAT WITH OR WITHOUT PRESENCE OF ULCERS (sore throat, sores in mouth, or a toothache) UNUSUAL RASH, SWELLING OR PAIN  UNUSUAL VAGINAL DISCHARGE OR ITCHING   Items with * indicate a potential emergency and should be followed up as soon as possible or go to the Emergency Department if any problems should occur.  Please show the CHEMOTHERAPY ALERT CARD or  IMMUNOTHERAPY ALERT CARD at check-in to the Emergency Department and triage nurse.  Should you have questions after your visit or need to cancel or reschedule your appointment, please contact [facility] - A DEPT [facility]  Dept: [phone]  and follow the prompts.  Office hours are 8:00 a.m. to 4:30 p.m. Monday - Friday. Please note that voicemails left after 4:00 p.m. may not be returned until the following business day.  We are closed weekends and major holidays. You have access to a nurse at all times for urgent questions. Please call the main number to the clinic Dept: [phone] and follow the prompts.   For any non-urgent questions, you may also contact your provider using MyChart. We now offer e-Visits for anyone 67 and older to request care online for non-urgent symptoms. For details visit [email or web address].   Also download the MyChart app! Go to the app store, search "MyChart", open the app, select [place], and log in with your MyChart username and password.

## 2023-04-09 ENCOUNTER — Other Ambulatory Visit: Payer: Self-pay | Admitting: Physician Assistant

## 2023-04-09 ENCOUNTER — Encounter: Payer: Self-pay | Admitting: Internal Medicine

## 2023-04-09 LAB — T4: T4, Total: 10.2 ug/dL (ref 4.5–12.0)

## 2023-04-10 ENCOUNTER — Inpatient Hospital Stay: Payer: Medicare Other

## 2023-04-10 VITALS — BP 129/69 | HR 73 | Temp 97.7°F | Resp 78

## 2023-04-10 DIAGNOSIS — C3491 Malignant neoplasm of unspecified part of right bronchus or lung: Secondary | ICD-10-CM

## 2023-04-10 DIAGNOSIS — Z5189 Encounter for other specified aftercare: Secondary | ICD-10-CM | POA: Diagnosis not present

## 2023-04-10 DIAGNOSIS — C3431 Malignant neoplasm of lower lobe, right bronchus or lung: Secondary | ICD-10-CM | POA: Diagnosis not present

## 2023-04-10 DIAGNOSIS — C778 Secondary and unspecified malignant neoplasm of lymph nodes of multiple regions: Secondary | ICD-10-CM | POA: Diagnosis not present

## 2023-04-10 DIAGNOSIS — Z79899 Other long term (current) drug therapy: Secondary | ICD-10-CM | POA: Diagnosis not present

## 2023-04-10 DIAGNOSIS — Z5111 Encounter for antineoplastic chemotherapy: Secondary | ICD-10-CM | POA: Diagnosis not present

## 2023-04-10 DIAGNOSIS — Z5112 Encounter for antineoplastic immunotherapy: Secondary | ICD-10-CM | POA: Diagnosis not present

## 2023-04-10 MED ORDER — PEGFILGRASTIM-FPGK 6 MG/0.6ML ~~LOC~~ SOSY
6.0000 mg | PREFILLED_SYRINGE | Freq: Once | SUBCUTANEOUS | Status: AC
  Administered 2023-04-10: 6 mg via SUBCUTANEOUS

## 2023-04-16 ENCOUNTER — Other Ambulatory Visit: Payer: Self-pay | Admitting: Internal Medicine

## 2023-04-16 NOTE — Telephone Encounter (Signed)
 Copied from CRM [id number]. Topic: Clinical - Medication Refill >> Apr 16, 2023  9:23 AM Cherylynn B wrote: Most Recent Primary Care Visit:  Provider: ROLLENE NORRIS A  Department: [facility]  Visit Type: HOSPITAL FU  Date: 10/31/2022  Medication: HYDROcodone -acetaminophen  (NORCO) 10-325 MG tablet  Has the patient contacted their pharmacy? No, no refills, advised to call each month for refill per Dr Rollene (Agent: If no, request that the patient contact the pharmacy for the refill. If patient does not wish to contact the pharmacy document the reason why and proceed with request.) (Agent: If yes, when and what did the pharmacy advise?)  Is this the correct pharmacy for this prescription? Yes If no, delete pharmacy and type the correct one.  This is the patient's preferred pharmacy:   [id number] [place], [place] - [address] AT [facility] [address] [facility] [address] [place] [address] Phone: [phone] Fax: [phone]   Has the prescription been filled recently? Yes  Is the patient out of the medication? Yes, 1 left  Has the patient been seen for an appointment in the last year OR does the patient have an upcoming appointment? Yes  Can we respond through MyChart? Yes  Agent: Please be advised that Rx refills may take up to 3 business days. We ask that you follow-up with your pharmacy.

## 2023-04-17 MED ORDER — HYDROCODONE-ACETAMINOPHEN 10-325 MG PO TABS: 1.0000 | ORAL_TABLET | Freq: Two times a day (BID) | ORAL | 0 refills | Status: DC | PRN

## 2023-04-22 ENCOUNTER — Other Ambulatory Visit: Payer: Self-pay | Admitting: Internal Medicine

## 2023-04-22 DIAGNOSIS — C3491 Malignant neoplasm of unspecified part of right bronchus or lung: Secondary | ICD-10-CM

## 2023-04-24 NOTE — Progress Notes (Signed)
 [facility] OFFICE PROGRESS NOTE  [name], MD [address] [place] [address]  DIAGNOSIS: Metastatic non-small cell lung cancer initially diagnosed as stage IIIA (T4, N0, M0) non-small cell lung cancer, favoring adenocarcinoma presented with large right lower lobe lung mass with no mediastinal lymphadenopathy or extrathoracic metastasis diagnosed in October 2022. Had disease progression with enlarging mass and new right hilar and mediastinal lymph nodes in December 2022.     PRIOR THERAPY: 1) Neoadjuvant treatment with carboplatin for AUC of 5, Alimta 500 Mg/M2 and nivolumab 360 Mg IV every 3 weeks.  First dose January 23, 2021.  Status post 3 cycles. 2) Concurrent chemoradiation with carboplatin for an AUC of 2 and paclitaxel 45 mg per metered squared.  First dose expected on 04/01/2021.  Status post 4 cycles 3) Consolidation treatment with immunotherapy with Imfinzi 1500 Mg IV every 4 weeks.  First dose on Jul 08, 2021.  Status post 9 cycles.  4) Palliative systemic chemotherapy with carboplatin for an AUC of 5, Alimta 500 mg/m, Keytruda 200 mg IV every 3 weeks. First dose on 05/13/2022.  Status post 12 cycles.  Starting him cycle #3 his dose of carboplatin will be reduced to AUC of 4 and Alimta 400 Mg/M2 secondary to intolerance.  Last dose was given January 26, 2023 discontinued secondary to disease progression.  CURRENT THERAPY: Second line systemic chemotherapy with docetaxel 65 Mg/M2 and ramucirumab 10 Mg/KG every 3 weeks with Neulasta support. First dose April 08, 2023. He had a reaction to cyramza, therefore, this is run at a slower rate.   INTERVAL HISTORY: Brandon Rubio. 73 y.o. male returns to the clinic today for a follow-up visit.  The patient was last seen in clinic by Dr. Arbutus Ped on 04/02/2023.   In November 2024, the scan showed questionable pneumonitis versus disease progression.  The patient was placed on a high-dose steroid taper.  He  had a follow-up scan that showed suspected disease progression.  Therefore Dr. Arbutus Ped recommended second line chemotherapy with docetaxel slightly dose reduced and Cyramza.  The patient did have a reaction to Cyramza with itching.  He received additional premedications with Solu-Medrol and Pepcid.  He then resumed treatment at a slower rate and tolerated the remainder of his infusion.  The patient has had a challenging time with other chemotherapy regimens in the past.  The patient states that with his most recent chemotherapy with docetaxel and Cyramza that he felt "terrible" 2 of the 3 weeks since he was last seen.  He reports fatigue, decreased appetite, and polyarthralgia after giving the Neulasta injection.  Of note the patient is receiving pain medication with Norco by one of his providers, Dr. Okey Dupre.  He denies any fever, chills, or night sweats.  He reports he occasionally gets shortness of breath when he gets up but then it subsides.  He denies any cough, chest pain, or hemoptysis.  He denies any nausea, vomiting, diarrhea, or constipation.  Denies any headache or visual changes.  He is here today for evaluation and consideration of proceeding with cycle #2 today.    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  MEDICATIONS:  Current Outpatient Medications  Medication Sig Dispense Refill   aspirin EC 81 MG tablet Take 81 mg by mouth in the morning. Swallow whole.     bisacodyl  (DULCOLAX) 5 MG EC tablet Take 5 mg by mouth daily as needed for moderate constipation.     dexamethasone (DECADRON) 4 MG tablet 2 tablet p.o. twice daily the day before, day of and day after chemotherapy every 3 weeks 60 tablet 0   folic acid (FOLVITE) 1 MG tablet TAKE 1 TABLET BY MOUTH EVERY DAY 30 tablet 2   HYDROcodone-acetaminophen (NORCO) 10-325 MG tablet Take 1 tablet by mouth every 12 (twelve) hours as needed. 60 tablet 0   Magnesium Gluconate 550 MG TABS Take 1 tablet (550 mg total) by mouth 2 (two) times daily. 60 tablet 3   metFORMIN (GLUCOPHAGE) 1000 MG tablet TAKE 1 TABLET(1000 MG) BY MOUTH TWICE DAILY WITH A MEAL 180 tablet 1   metoprolol tartrate (LOPRESSOR) 25 MG tablet Take 1 tablet (25 mg total) by mouth 2 (two) times daily. 60 tablet 3   Multiple Vitamins-Minerals (CENTRUM SILVER 50+MEN) TABS Take 1 tablet by mouth daily with breakfast.     omeprazole (PRILOSEC) 20 MG capsule TAKE 1 CAPSULE BY MOUTH TWICE DAILY BEFORE A MEAL 180 capsule 1   ondansetron (ZOFRAN) 8 MG tablet Take 1 tablet (8 mg total) by mouth every 8 (eight) hours as needed for nausea or vomiting. 30 tablet 2   predniSONE (DELTASONE) 5 MG tablet Take 1-1.5 tablets (5-7.5 mg total) by mouth daily with breakfast. (Patient taking differently: Take 7.5 mg by mouth daily with breakfast.) 135 tablet 3   prochlorperazine (COMPAZINE) 10 MG tablet TAKE 1 TABLET(10 MG) BY MOUTH EVERY 6 HOURS FOR UP TO 14 DAYS AS NEEDED FOR NAUSEA OR VOMITING 60 tablet 1   simvastatin (ZOCOR) 40 MG tablet TAKE 1 TABLET BY MOUTH EVERY EVENING AT 6 PM. (Patient taking differently: Take 40 mg by mouth in the morning.) 90 tablet 3   No current facility-administered medications for this visit.   Facility-Administered Medications Ordered in Other Visits  Medication Dose Route Frequency Provider Last Rate Last Admin   0.9 %  sodium chloride infusion   Intravenous Continuous [name], MD 10 mL/hr at 04/29/23 1103 New Bag at 04/29/23 1103    DOCEtaxel (TAXOTERE) 137 mg in sodium chloride 0.9 % 250 mL chemo infusion  65 mg/m2 (Treatment Plan Recorded) Intravenous Once [name], MD       ramucirumab [facility]) 900 mg in sodium chloride 0.9 % 160 mL chemo infusion  10 mg/kg (Treatment Plan Recorded) Intravenous Once [name], MD 125 mL/hr at 04/29/23 1223 900 mg at 04/29/23 1223    SURGICAL HISTORY:  Past Surgical History:  Procedure Laterality Date   BRONCHIAL BIOPSY  12/10/2020   Procedure: BRONCHIAL BIOPSIES;  Surgeon: Leslye Peer, MD;  Location: [facility] ENDOSCOPY;  Service: Pulmonary;;   BRONCHIAL BRUSHINGS  12/10/2020   Procedure: BRONCHIAL BRUSHINGS;  Surgeon: Leslye Peer, MD;  Location: [facility] ENDOSCOPY;  Service: Pulmonary;;   BRONCHIAL NEEDLE ASPIRATION BIOPSY  12/10/2020   Procedure: BRONCHIAL NEEDLE ASPIRATION BIOPSIES;  Surgeon: Leslye Peer, MD;  Location: [facility] ENDOSCOPY;  Service: Pulmonary;;   COLONOSCOPY     COLONOSCOPY W/  POLYPECTOMY  03/10/2009   LARYNGOSCOPY  03/10/1997   VIDEO BRONCHOSCOPY WITH ENDOBRONCHIAL NAVIGATION N/A 12/10/2020   Procedure: ROBOTIC VIDEO BRONCHOSCOPY WITH ENDOBRONCHIAL NAVIGATION;  Surgeon: Leslye Peer, MD;  Location: [facility] ENDOSCOPY;  Service: Pulmonary;  Laterality: N/A;    REVIEW OF SYSTEMS:   Review of Systems  Constitutional: Positive for fatigue and decreased appetite. Negative for chills, fever and unexpected weight change.  HENT: Negative for mouth sores, nosebleeds, sore throat and trouble swallowing.   Eyes: Negative for eye problems and icterus.  Respiratory: Positive for intermittent shortness of breath. Negative for cough, hemoptysis, and wheezing.   Cardiovascular: Negative for chest pain and leg swelling.  Gastrointestinal: Negative for abdominal pain, constipation, diarrhea, nausea and vomiting.  Genitourinary: Negative for bladder incontinence, difficulty urinating, dysuria, frequency and hematuria.   Musculoskeletal: Positive for multifocal joint pain.  Negative for gait problem Skin: Negative for itching and rash.  Neurological: Negative for dizziness, extremity weakness, gait problem, headaches, light-headedness and seizures.  Hematological: Negative for adenopathy. Does not bruise/bleed easily.  Psychiatric/Behavioral: Negative for confusion, depression and sleep disturbance. The patient is not nervous/anxious.     PHYSICAL EXAMINATION:  There were no vitals taken for this visit.  ECOG PERFORMANCE STATUS: 1  Physical Exam  Constitutional: Oriented to person, place, and time and well-developed, well-nourished, and in no distress.  Head: Normocephalic and atraumatic.  Mouth/Throat: Oropharynx is clear and moist. No oropharyngeal exudate.  Eyes: Conjunctivae are normal. Right eye exhibits no discharge. Left eye exhibits no discharge. No scleral icterus.  Neck: Normal range of motion. Neck supple.  Cardiovascular: Normal rate, regular rhythm, normal heart sounds and intact distal pulses.   Pulmonary/Chest: Effort normal and breath sounds normal. No respiratory distress. No wheezes. No rales.  Abdominal: Soft. Bowel sounds are normal. Exhibits no distension and no mass. There is no tenderness.  Musculoskeletal: Normal range of motion. Exhibits no edema.  Lymphadenopathy:    No cervical adenopathy.  Neurological: Alert and oriented to person, place, and time. Exhibits normal muscle tone. Gait normal. Coordination normal.  Skin: Skin is warm and dry. No rash noted. Not diaphoretic. No erythema. No pallor.  Psychiatric: Mood, memory and judgment normal.  Vitals reviewed.  LABORATORY DATA: Lab Results  Component Value Date   WBC 14.3 (H) 04/29/2023   HGB 12.7 (L) 04/29/2023   HCT 37.3 (L) 04/29/2023   MCV 93.3 04/29/2023   PLT 206 04/29/2023      Chemistry      Component Value Date/Time   NA 139 04/29/2023 0957   NA 137 10/24/2022 1540   K 3.7 04/29/2023 0957   CL 106 04/29/2023 0957   CO2 23 04/29/2023 0957   BUN 12  04/29/2023 0957   BUN 9 10/24/2022 1540   CREATININE 0.97 04/29/2023 0957      Component Value Date/Time   CALCIUM 9.1 04/29/2023 0957   ALKPHOS 60 04/29/2023 0957   AST 13 (L) 04/29/2023 0957   ALT 11 04/29/2023 0957   BILITOT 0.3 04/29/2023 0957       RADIOGRAPHIC STUDIES:  No results found.   ASSESSMENT/PLAN:  This is a very pleasant 73 year old Caucasian male with recurrent and metastatic non-small cell lung cancer, adenocarcinoma. He was initially diagnosed as a stage IIIa (T4, N1, M0). He presented with a right upper lobe mass and right hilar and mediastinal lymphadenopathy. He was initially diagnosed in October 2022. The patient developed metastatic disease with bilateral pulmonary nodules in January 2024. He does not have any actionable  mutations by foundation one.   The patient initially underwent 3 cycles of neoadjuvant systemic chemotherapy with carboplatin, Alimta, nivolumab.  Unfortunately the patient did not have significant improvement in his disease and actually developed disease progression.    He then underwent a course of concurrent chemoradiation.   He is currently on consolidation immunotherapy with Imfinzi 1500 mg IV every 4 weeks and is status post 9 cycles.   Therefore, he was started on systemic chemotherapy with carboplatin for an AUC of 5, Alimta 500 mg/m, and Keytruda 200 mg IV every 3 weeks. He underwent his first dose of treatment on 05/13/22.  He is status post 11 cycles.  His dose of carboplatin was reduced to an AUC of 5 and Alimta to 400 mg/m2 starting from cycle #3 due to intolerance.  He started maintenance treatment with dose reduced Alimta 400 mg/m and Keytruda 200 mg IV every 3 weeks starting from cycle #5. This was discontinued due to disease progression.   The patient is currently on second line chemotherapy with dose reduced docetaxel at 65 mg/m and Cyramza 10 mg/kg IV every 3 weeks.  First dose on 04/02/23.    He had a reaction to Cyramza  with itching.  Moving forward his premedications included Tylenol, Benadryl, Pepcid, and Decadron.  He will also receive his treatment at a slower rate.  Patient was seen with Dr. Arbutus Ped today.  We discussed his symptoms.  After discussion the patient was agreeable to proceed with cycle #2 today as scheduled with the same dose, which is slightly dose reduced docetaxel 65 mg/m2.  We discussed using his pain medication and taking Claritin for 7 days following his Neulasta injection which may cause arthralgias and myalgias.   We will see him back for follow-up visit in 3 weeks for evaluation repeat blood work before undergoing cycle #3.  I will arrange for weekly lab appointments.  The patient was advised to call immediately if he has any concerning symptoms in the interval. The patient voices understanding of current disease status and treatment options and is in agreement with the current care plan. All questions were answered. The patient knows to call the clinic with any problems, questions or concerns. We can certainly see the patient much sooner if necessary    No orders of the defined types were placed in this encounter.    Aayliah Rotenberry L Linda Grimmer, PA-C 04/29/23  ADDENDUM: Hematology/Oncology Attending:  I had a face-to-face encounter with the patient today.  I reviewed his record, lab and recommended his care plan.  This is a very pleasant 73 years old white male with metastatic non-small cell lung cancer, adenocarcinoma initially diagnosed as stage IIIa in October 2022 treated with neoadjuvant systemic chemotherapy with no improvement of his condition.  This was followed by a course of concurrent chemoradiation followed by consolidation treatment with immunotherapy with Imfinzi for 9 cycles.  The patient had evidence for disease progression in February 2024 and he started treatment with systemic chemotherapy with carboplatin, Alimta and Keytruda for 4 cycles followed by 8 cycles of  maintenance treatment discontinued in November 2024 secondary to disease progression. He is currently undergoing second line systemic chemotherapy with reduced dose docetaxel and Cyramza status post 1 cycle.  He has a rough time with the treatment specially after the Neulasta injection with significant aching pain and arthralgia in his shoulder and knees.  He was reluctant to consider proceeding with cycle #2 but after discussion of the pros and cons of this treatment he  decided to proceed with the treatment as planned.  He will use pain medication in addition to occasional ibuprofen and Claritin after the Neulasta injection to help with his aching pain and fatigue. We will see him back for follow-up visit in 3 weeks for evaluation before starting cycle #3. The patient was advised to call immediately if he has any other concerning symptoms in the interval. The total time spent in the appointment was 30 minutes. Disclaimer: This note was dictated with voice recognition software. Similar sounding words can inadvertently be transcribed and may be missed upon review. Lajuana Matte, MD .

## 2023-04-29 ENCOUNTER — Inpatient Hospital Stay (HOSPITAL_BASED_OUTPATIENT_CLINIC_OR_DEPARTMENT_OTHER): Payer: Medicare Other | Admitting: Physician Assistant

## 2023-04-29 ENCOUNTER — Other Ambulatory Visit: Payer: Self-pay | Admitting: Pharmacist

## 2023-04-29 ENCOUNTER — Inpatient Hospital Stay: Payer: Medicare Other

## 2023-04-29 ENCOUNTER — Inpatient Hospital Stay: Payer: Medicare Other | Attending: Internal Medicine

## 2023-04-29 VITALS — BP 107/64 | HR 80 | Temp 97.6°F | Resp 16 | Wt 188.5 lb

## 2023-04-29 DIAGNOSIS — C3491 Malignant neoplasm of unspecified part of right bronchus or lung: Secondary | ICD-10-CM

## 2023-04-29 DIAGNOSIS — Z5112 Encounter for antineoplastic immunotherapy: Secondary | ICD-10-CM | POA: Diagnosis not present

## 2023-04-29 DIAGNOSIS — C3431 Malignant neoplasm of lower lobe, right bronchus or lung: Secondary | ICD-10-CM | POA: Diagnosis not present

## 2023-04-29 DIAGNOSIS — Z79899 Other long term (current) drug therapy: Secondary | ICD-10-CM | POA: Insufficient documentation

## 2023-04-29 DIAGNOSIS — Z5111 Encounter for antineoplastic chemotherapy: Secondary | ICD-10-CM | POA: Insufficient documentation

## 2023-04-29 DIAGNOSIS — Z923 Personal history of irradiation: Secondary | ICD-10-CM | POA: Insufficient documentation

## 2023-04-29 DIAGNOSIS — C7800 Secondary malignant neoplasm of unspecified lung: Secondary | ICD-10-CM | POA: Insufficient documentation

## 2023-04-29 LAB — CMP (CANCER CENTER ONLY)
ALT: 11 U/L (ref 0–44)
AST: 13 U/L — ABNORMAL LOW (ref 15–41)
Albumin: 4 g/dL (ref 3.5–5.0)
Alkaline Phosphatase: 60 U/L (ref 38–126)
Anion gap: 10 (ref 5–15)
BUN: 12 mg/dL (ref 8–23)
CO2: 23 mmol/L (ref 22–32)
Calcium: 9.1 mg/dL (ref 8.9–10.3)
Chloride: 106 mmol/L (ref 98–111)
Creatinine: 0.97 mg/dL (ref 0.61–1.24)
GFR, Estimated: >60 mL/min (ref >60)
Glucose, Bld: 138 mg/dL — ABNORMAL HIGH (ref 70–99)
Potassium: 3.7 mmol/L (ref 3.5–5.1)
Sodium: 139 mmol/L (ref 135–145)
Total Bilirubin: 0.3 mg/dL (ref 0.0–1.2)
Total Protein: 6.7 g/dL (ref 6.5–8.1)

## 2023-04-29 LAB — CBC WITH DIFFERENTIAL (CANCER CENTER ONLY)
Abs Immature Granulocytes: 0.07 10*3/uL (ref 0.00–0.07)
Basophils Absolute: 0.1 10*3/uL (ref 0.0–0.1)
Basophils Relative: 1 %
Eosinophils Absolute: 0 10*3/uL (ref 0.0–0.5)
Eosinophils Relative: 0 %
HCT: 37.3 % — ABNORMAL LOW (ref 39.0–52.0)
Hemoglobin: 12.7 g/dL — ABNORMAL LOW (ref 13.0–17.0)
Immature Granulocytes: 1 %
Lymphocytes Relative: 12 %
Lymphs Abs: 1.7 10*3/uL (ref 0.7–4.0)
MCH: 31.8 pg (ref 26.0–34.0)
MCHC: 34 g/dL (ref 30.0–36.0)
MCV: 93.3 fL (ref 80.0–100.0)
Monocytes Absolute: 1.2 10*3/uL — ABNORMAL HIGH (ref 0.1–1.0)
Monocytes Relative: 9 %
Neutro Abs: 11.2 10*3/uL — ABNORMAL HIGH (ref 1.7–7.7)
Neutrophils Relative %: 77 %
Platelet Count: 206 10*3/uL (ref 150–400)
RBC: 4 MIL/uL — ABNORMAL LOW (ref 4.22–5.81)
RDW: 12.7 % (ref 11.5–15.5)
WBC Count: 14.3 10*3/uL — ABNORMAL HIGH (ref 4.0–10.5)
nRBC: 0 % (ref 0.0–0.2)

## 2023-04-29 MED ORDER — SODIUM CHLORIDE 0.9 % IV SOLN
65.0000 mg/m2 | Freq: Once | INTRAVENOUS | Status: AC
  Administered 2023-04-29: 137 mg via INTRAVENOUS
  Filled 2023-04-29: qty 13.7

## 2023-04-29 MED ORDER — FAMOTIDINE IN NACL 20-0.9 MG/50ML-% IV SOLN
20.0000 mg | Freq: Once | INTRAVENOUS | Status: AC
  Administered 2023-04-29: 20 mg via INTRAVENOUS
  Filled 2023-04-29: qty 50

## 2023-04-29 MED ORDER — ACETAMINOPHEN 325 MG PO TABS
650.0000 mg | ORAL_TABLET | Freq: Once | ORAL | Status: AC
Start: 2023-04-29 — End: 2023-04-29
  Administered 2023-04-29: 650 mg via ORAL
  Filled 2023-04-29: qty 2

## 2023-04-29 MED ORDER — SODIUM CHLORIDE 0.9 % IV SOLN: INTRAVENOUS | Status: DC

## 2023-04-29 MED ORDER — DIPHENHYDRAMINE HCL 50 MG/ML IJ SOLN
50.0000 mg | Freq: Once | INTRAMUSCULAR | Status: AC
  Administered 2023-04-29: 50 mg via INTRAVENOUS
  Filled 2023-04-29: qty 1

## 2023-04-29 MED ORDER — SODIUM CHLORIDE 0.9 % IV SOLN
10.0000 mg/kg | Freq: Once | INTRAVENOUS | Status: AC
  Administered 2023-04-29: 900 mg via INTRAVENOUS
  Filled 2023-04-29: qty 40

## 2023-04-29 MED ORDER — DEXAMETHASONE SODIUM PHOSPHATE 10 MG/ML IJ SOLN
10.0000 mg | Freq: Once | INTRAMUSCULAR | Status: AC
  Administered 2023-04-29: 10 mg via INTRAVENOUS
  Filled 2023-04-29: qty 1

## 2023-04-29 NOTE — Patient Instructions (Signed)
 [facility]  Discharge Instructions: Thank you for choosing [facility] to provide your oncology and hematology care.   If you have a lab appointment with the Cancer Center, please go directly to the Cancer Center and check in at the registration area.   Wear comfortable clothing and clothing appropriate for easy access to any Portacath or PICC line.   We strive to give you quality time with your provider. You may need to reschedule your appointment if you arrive late (15 or more minutes).  Arriving late affects you and other patients whose appointments are after yours.  Also, if you miss three or more appointments without notifying the office, you may be dismissed from the clinic at the provider's discretion.      For prescription refill requests, have your pharmacy contact our office and allow 72 hours for refills to be completed.    Today you received the following chemotherapy and/or immunotherapy agents Cyramza & Taxotere      To help prevent nausea and vomiting after your treatment, we encourage you to take your nausea medication as directed.  BELOW ARE SYMPTOMS THAT SHOULD BE REPORTED IMMEDIATELY: *FEVER GREATER THAN 100.4 F (38 C) OR HIGHER *CHILLS OR SWEATING *NAUSEA AND VOMITING THAT IS NOT CONTROLLED WITH YOUR NAUSEA MEDICATION *UNUSUAL SHORTNESS OF BREATH *UNUSUAL BRUISING OR BLEEDING *URINARY PROBLEMS (pain or burning when urinating, or frequent urination) *BOWEL PROBLEMS (unusual diarrhea, constipation, pain near the anus) TENDERNESS IN MOUTH AND THROAT WITH OR WITHOUT PRESENCE OF ULCERS (sore throat, sores in mouth, or a toothache) UNUSUAL RASH, SWELLING OR PAIN  UNUSUAL VAGINAL DISCHARGE OR ITCHING   Items with * indicate a potential emergency and should be followed up as soon as possible or go to the Emergency Department if any problems should occur.  Please show the CHEMOTHERAPY ALERT CARD or  IMMUNOTHERAPY ALERT CARD at check-in to the Emergency Department and triage nurse.  Should you have questions after your visit or need to cancel or reschedule your appointment, please contact [facility] A [facility]  Dept: [phone]  and follow the prompts.  Office hours are 8:00 a.m. to 4:30 p.m. Monday - Friday. Please note that voicemails left after 4:00 p.m. may not be returned until the following business day.  We are closed weekends and major holidays. You have access to a nurse at all times for urgent questions. Please call the main number to the clinic Dept: [phone] and follow the prompts.   For any non-urgent questions, you may also contact your provider using MyChart. We now offer e-Visits for anyone 26 and older to request care online for non-urgent symptoms. For details visit [email or web address].   Also download the MyChart app! Go to the app store, search "MyChart", open the app, select [place], and log in with your MyChart username and password.

## 2023-04-30 ENCOUNTER — Telehealth: Payer: Self-pay | Admitting: Physician Assistant

## 2023-04-30 MED ORDER — METOPROLOL TARTRATE 25 MG PO TABS: 25.0000 mg | ORAL_TABLET | Freq: Two times a day (BID) | ORAL | 2 refills | Status: DC

## 2023-04-30 NOTE — Telephone Encounter (Signed)
*  STAT* If patient is at the pharmacy, call can be transferred to refill team.   1. Which medications need to be refilled? (please list name of each medication and dose if known)   metoprolol tartrate (LOPRESSOR) 25 MG tablet     4. Which pharmacy/location (including street and city if local pharmacy) is medication to be sent to? [id number] - [place],  - [address]     5. Do they need a 30 day or 90 day supply? 90     Pt scheduled for f/u 08/06/23

## 2023-05-01 ENCOUNTER — Inpatient Hospital Stay: Payer: Medicare Other

## 2023-05-01 DIAGNOSIS — C3491 Malignant neoplasm of unspecified part of right bronchus or lung: Secondary | ICD-10-CM

## 2023-05-01 DIAGNOSIS — Z923 Personal history of irradiation: Secondary | ICD-10-CM | POA: Diagnosis not present

## 2023-05-01 DIAGNOSIS — C3431 Malignant neoplasm of lower lobe, right bronchus or lung: Secondary | ICD-10-CM | POA: Diagnosis not present

## 2023-05-01 DIAGNOSIS — Z79899 Other long term (current) drug therapy: Secondary | ICD-10-CM | POA: Diagnosis not present

## 2023-05-01 DIAGNOSIS — C7800 Secondary malignant neoplasm of unspecified lung: Secondary | ICD-10-CM | POA: Diagnosis not present

## 2023-05-01 DIAGNOSIS — Z5112 Encounter for antineoplastic immunotherapy: Secondary | ICD-10-CM | POA: Diagnosis not present

## 2023-05-01 DIAGNOSIS — Z5111 Encounter for antineoplastic chemotherapy: Secondary | ICD-10-CM | POA: Diagnosis not present

## 2023-05-01 MED ORDER — PEGFILGRASTIM-FPGK 6 MG/0.6ML ~~LOC~~ SOSY
6.0000 mg | PREFILLED_SYRINGE | Freq: Once | SUBCUTANEOUS | Status: AC
Start: 2023-05-01 — End: 2023-05-01
  Administered 2023-05-01: 6 mg via SUBCUTANEOUS
  Filled 2023-05-01: qty 0.6

## 2023-05-05 ENCOUNTER — Telehealth: Payer: Self-pay

## 2023-05-05 ENCOUNTER — Encounter: Payer: Self-pay | Admitting: Internal Medicine

## 2023-05-05 NOTE — Telephone Encounter (Signed)
 Spoke with patient this afternoon about folic acid medication.  Per Cassie, PA- patient does not need to take folic acid anymore since he is not on Alimta anymore. Patient reports that he has a few more left and will stop taking it after that.  Patient asked why he has a lab only appt tomorrow.  Informed patient that since his treatment changed to Cyramza and Taxotere, weekly labs are required to maintain levels. Patient verbalized understanding.

## 2023-05-06 ENCOUNTER — Other Ambulatory Visit: Payer: Self-pay

## 2023-05-06 ENCOUNTER — Inpatient Hospital Stay: Payer: Medicare Other

## 2023-05-06 ENCOUNTER — Telehealth: Payer: Self-pay

## 2023-05-06 DIAGNOSIS — C3491 Malignant neoplasm of unspecified part of right bronchus or lung: Secondary | ICD-10-CM

## 2023-05-06 NOTE — Telephone Encounter (Signed)
 Scheduler spoke with patient this morning and the patient stated that he wasn't feeling well and wouldn't make it to his lab appt today.  Tried to reach patient to check in.  Spoke with grand-daughter and she states that he is having some aches and joint pains and is taking Vicodin for pain relief. She offered to take him to his appt today but he refused.  Informed grand-daughter that he can call in the morning and make lab appt for tomorrow if he would like.  Informed grand-daughter I will call and see how he is doing tomorrow. Grand-daughter verbalized understanding.

## 2023-05-12 ENCOUNTER — Other Ambulatory Visit: Payer: Self-pay | Admitting: Internal Medicine

## 2023-05-12 ENCOUNTER — Encounter: Payer: Self-pay | Admitting: Internal Medicine

## 2023-05-12 MED ORDER — HYDROCODONE-ACETAMINOPHEN 10-325 MG PO TABS
1.0000 | ORAL_TABLET | Freq: Two times a day (BID) | ORAL | 0 refills | Status: DC | PRN
Start: 2023-05-12 — End: 2023-06-16

## 2023-05-12 NOTE — Telephone Encounter (Signed)
 Copied from CRM [id number]. Topic: Clinical - Medication Refill >> May 12, 2023 10:12 AM Desma Mcgregor wrote: Most Recent Primary Care Visit:  Provider: Hillard Danker A  Department: [facility]  Visit Type: HOSPITAL FU  Date: 10/31/2022  Medication: HYDROcodone-acetaminophen (NORCO) 10-325 MG tablet  Has the patient contacted their pharmacy? No, but stated that the bottles always have no refills.  Is this the correct pharmacy for this prescription? Yes If no, delete pharmacy and type the correct one.  This is the patient's preferred pharmacy:  [id number] [place], [place] - [address] AT [facility] [address] [facility] [address] [place] [address] Phone: [phone] Fax: [phone]   Has the prescription been filled recently? Yes  Is the patient out of the medication? No, but has a few tablets left  Has the patient been seen for an appointment in the last year OR does the patient have an upcoming appointment? Yes  Can we respond through MyChart? No  Agent: Please be advised that Rx refills may take up to 3 business days. We ask that you follow-up with your pharmacy.

## 2023-05-12 NOTE — Telephone Encounter (Signed)
 Requested Prescriptions   Pending Prescriptions Disp Refills   HYDROcodone-acetaminophen (NORCO) 10-325 MG tablet 60 tablet 0    Sig: Take 1 tablet by mouth every 12 (twelve) hours as needed.     Date of patient request: 05/12/2023 Last office visit: 10/31/2022 Upcoming visit: Visit date not found Date of last refill: 04/17/2023 Last refill amount: 60

## 2023-05-12 NOTE — Telephone Encounter (Signed)
 Last Fill: 04/17/23 60 tabs/0 RF  Last OV: 10/31/22 Next OV: 09/17/23 AWV  Routing to provider for review/authorization.

## 2023-05-12 NOTE — Telephone Encounter (Signed)
 Spoke with patient to check in and see how he is doing. Patient states "I haven't been out of the house since last treatment because of aches and pains in the neck, hips and back but since yesterday, my hips are getting better so I'm able to walk better, I think I'm allergic to the immunotherapy because I was itching a little bit around my neck and thighs, I've had cold sweats, and Ive been taking half of the 800 mg ibuprofen because that helps my pain."  Informed patient that the WBC booster injection can cause some joint pain and its recommended to take Claritin the day of injection and for 5-7 days afterward.  Patient states that he has been taking the Claritin everyday. Overall, patient states that he is feeling better and is coming to his lab appt tomorrow. Informed patient that if any concerns from lab work that our office will give him a call, otherwise to call with any concerns or questions. Patient verbalized understanding.

## 2023-05-13 ENCOUNTER — Inpatient Hospital Stay: Payer: Medicare Other | Attending: Internal Medicine

## 2023-05-13 DIAGNOSIS — C3431 Malignant neoplasm of lower lobe, right bronchus or lung: Secondary | ICD-10-CM | POA: Diagnosis not present

## 2023-05-13 DIAGNOSIS — E1165 Type 2 diabetes mellitus with hyperglycemia: Secondary | ICD-10-CM | POA: Insufficient documentation

## 2023-05-13 DIAGNOSIS — Z923 Personal history of irradiation: Secondary | ICD-10-CM | POA: Diagnosis not present

## 2023-05-13 DIAGNOSIS — C3491 Malignant neoplasm of unspecified part of right bronchus or lung: Secondary | ICD-10-CM

## 2023-05-13 DIAGNOSIS — Z79899 Other long term (current) drug therapy: Secondary | ICD-10-CM | POA: Insufficient documentation

## 2023-05-13 DIAGNOSIS — Z5112 Encounter for antineoplastic immunotherapy: Secondary | ICD-10-CM | POA: Diagnosis not present

## 2023-05-13 DIAGNOSIS — Z5111 Encounter for antineoplastic chemotherapy: Secondary | ICD-10-CM | POA: Insufficient documentation

## 2023-05-13 LAB — CBC WITH DIFFERENTIAL (CANCER CENTER ONLY)
Abs Immature Granulocytes: 0.31 10*3/uL — ABNORMAL HIGH (ref 0.00–0.07)
Basophils Absolute: 0.2 10*3/uL — ABNORMAL HIGH (ref 0.0–0.1)
Basophils Relative: 1 %
Eosinophils Absolute: 0 10*3/uL (ref 0.0–0.5)
Eosinophils Relative: 0 %
HCT: 37.9 % — ABNORMAL LOW (ref 39.0–52.0)
Hemoglobin: 12.4 g/dL — ABNORMAL LOW (ref 13.0–17.0)
Immature Granulocytes: 2 %
Lymphocytes Relative: 15 %
Lymphs Abs: 2.5 10*3/uL (ref 0.7–4.0)
MCH: 31.3 pg (ref 26.0–34.0)
MCHC: 32.7 g/dL (ref 30.0–36.0)
MCV: 95.7 fL (ref 80.0–100.0)
Monocytes Absolute: 0.8 10*3/uL (ref 0.1–1.0)
Monocytes Relative: 4 %
Neutro Abs: 13.5 10*3/uL — ABNORMAL HIGH (ref 1.7–7.7)
Neutrophils Relative %: 78 %
Platelet Count: 149 10*3/uL — ABNORMAL LOW (ref 150–400)
RBC: 3.96 MIL/uL — ABNORMAL LOW (ref 4.22–5.81)
RDW: 13.7 % (ref 11.5–15.5)
WBC Count: 17.2 10*3/uL — ABNORMAL HIGH (ref 4.0–10.5)
nRBC: 0 % (ref 0.0–0.2)

## 2023-05-13 LAB — CMP (CANCER CENTER ONLY)
ALT: 12 U/L (ref 0–44)
AST: 15 U/L (ref 15–41)
Albumin: 3.9 g/dL (ref 3.5–5.0)
Alkaline Phosphatase: 80 U/L (ref 38–126)
Anion gap: 10 (ref 5–15)
BUN: 14 mg/dL (ref 8–23)
CO2: 27 mmol/L (ref 22–32)
Calcium: 8.5 mg/dL — ABNORMAL LOW (ref 8.9–10.3)
Chloride: 105 mmol/L (ref 98–111)
Creatinine: 0.98 mg/dL (ref 0.61–1.24)
GFR, Estimated: >60 mL/min (ref >60)
Glucose, Bld: 123 mg/dL — ABNORMAL HIGH (ref 70–99)
Potassium: 3.5 mmol/L (ref 3.5–5.1)
Sodium: 142 mmol/L (ref 135–145)
Total Bilirubin: 0.4 mg/dL (ref 0.0–1.2)
Total Protein: 6.9 g/dL (ref 6.5–8.1)

## 2023-05-15 ENCOUNTER — Telehealth: Payer: Self-pay | Admitting: Medical Oncology

## 2023-05-15 NOTE — Telephone Encounter (Addendum)
 Overall pain -Occurred after Neulasta injections.    "My legs , back ,shoulder and neck hurt and I can't walk good. I don't want to get up to urinate or get mail because my legs hurt . I looked like a drunk person walking to parking lot after labs wed  ".  Denies falls or near falls. This is the second episode of the pain he described . Pain resolved several days after first injection.    Chills earlier in week . Art understands to call for chills with or with out fever and call for a temp =/> 100.4  Congestion nose bleeds after blowing his nose in am . -Claritin helped a lot.   I explained to pt his pain  may be from the neulasta injections and told him to take the pain med ordered by PCP if needed.   He said " I am not taking the injections any more". He will discuss with Provider at next visit.

## 2023-05-20 ENCOUNTER — Inpatient Hospital Stay: Payer: Medicare Other

## 2023-05-20 ENCOUNTER — Inpatient Hospital Stay (HOSPITAL_BASED_OUTPATIENT_CLINIC_OR_DEPARTMENT_OTHER): Payer: Medicare Other | Admitting: Internal Medicine

## 2023-05-20 VITALS — BP 116/72 | HR 82 | Temp 97.3°F | Resp 16 | Ht 72.0 in | Wt 192.5 lb

## 2023-05-20 DIAGNOSIS — Z79899 Other long term (current) drug therapy: Secondary | ICD-10-CM | POA: Diagnosis not present

## 2023-05-20 DIAGNOSIS — Z923 Personal history of irradiation: Secondary | ICD-10-CM | POA: Diagnosis not present

## 2023-05-20 DIAGNOSIS — Z5111 Encounter for antineoplastic chemotherapy: Secondary | ICD-10-CM | POA: Diagnosis not present

## 2023-05-20 DIAGNOSIS — Z5112 Encounter for antineoplastic immunotherapy: Secondary | ICD-10-CM | POA: Diagnosis not present

## 2023-05-20 DIAGNOSIS — C3490 Malignant neoplasm of unspecified part of unspecified bronchus or lung: Secondary | ICD-10-CM

## 2023-05-20 DIAGNOSIS — C3491 Malignant neoplasm of unspecified part of right bronchus or lung: Secondary | ICD-10-CM

## 2023-05-20 DIAGNOSIS — E1165 Type 2 diabetes mellitus with hyperglycemia: Secondary | ICD-10-CM | POA: Diagnosis not present

## 2023-05-20 DIAGNOSIS — C3431 Malignant neoplasm of lower lobe, right bronchus or lung: Secondary | ICD-10-CM | POA: Diagnosis not present

## 2023-05-20 LAB — CBC WITH DIFFERENTIAL (CANCER CENTER ONLY)
Abs Immature Granulocytes: 0.1 10*3/uL — ABNORMAL HIGH (ref 0.00–0.07)
Basophils Absolute: 0 10*3/uL (ref 0.0–0.1)
Basophils Relative: 0 %
Eosinophils Absolute: 0.3 10*3/uL (ref 0.0–0.5)
Eosinophils Relative: 2 %
HCT: 34.3 % — ABNORMAL LOW (ref 39.0–52.0)
Hemoglobin: 11.4 g/dL — ABNORMAL LOW (ref 13.0–17.0)
Immature Granulocytes: 1 %
Lymphocytes Relative: 7 %
Lymphs Abs: 1.2 10*3/uL (ref 0.7–4.0)
MCH: 31.3 pg (ref 26.0–34.0)
MCHC: 33.2 g/dL (ref 30.0–36.0)
MCV: 94.2 fL (ref 80.0–100.0)
Monocytes Absolute: 0.9 10*3/uL (ref 0.1–1.0)
Monocytes Relative: 5 %
Neutro Abs: 14.4 10*3/uL — ABNORMAL HIGH (ref 1.7–7.7)
Neutrophils Relative %: 85 %
Platelet Count: 247 10*3/uL (ref 150–400)
RBC: 3.64 MIL/uL — ABNORMAL LOW (ref 4.22–5.81)
RDW: 13.9 % (ref 11.5–15.5)
Smear Review: NORMAL
WBC Count: 16.9 10*3/uL — ABNORMAL HIGH (ref 4.0–10.5)
nRBC: 0 % (ref 0.0–0.2)

## 2023-05-20 LAB — TOTAL PROTEIN, URINE DIPSTICK: Protein, ur: NEGATIVE mg/dL

## 2023-05-20 LAB — CMP (CANCER CENTER ONLY)
ALT: 12 U/L (ref 0–44)
AST: 13 U/L — ABNORMAL LOW (ref 15–41)
Albumin: 4 g/dL (ref 3.5–5.0)
Alkaline Phosphatase: 56 U/L (ref 38–126)
Anion gap: 10 (ref 5–15)
BUN: 16 mg/dL (ref 8–23)
CO2: 22 mmol/L (ref 22–32)
Calcium: 8.7 mg/dL — ABNORMAL LOW (ref 8.9–10.3)
Chloride: 107 mmol/L (ref 98–111)
Creatinine: 0.95 mg/dL (ref 0.61–1.24)
GFR, Estimated: >60 mL/min (ref >60)
Glucose, Bld: 219 mg/dL — ABNORMAL HIGH (ref 70–99)
Potassium: 4 mmol/L (ref 3.5–5.1)
Sodium: 139 mmol/L (ref 135–145)
Total Bilirubin: 0.3 mg/dL (ref 0.0–1.2)
Total Protein: 6.7 g/dL (ref 6.5–8.1)

## 2023-05-20 LAB — TSH: TSH: 0.963 u[IU]/mL (ref 0.350–4.500)

## 2023-05-20 MED ORDER — SODIUM CHLORIDE 0.9 % IV SOLN: INTRAVENOUS | Status: DC

## 2023-05-20 MED ORDER — DEXAMETHASONE SODIUM PHOSPHATE 10 MG/ML IJ SOLN
10.0000 mg | Freq: Once | INTRAMUSCULAR | Status: AC
  Administered 2023-05-20: 10 mg via INTRAVENOUS
  Filled 2023-05-20: qty 1

## 2023-05-20 MED ORDER — DIPHENHYDRAMINE HCL 50 MG/ML IJ SOLN
50.0000 mg | Freq: Once | INTRAMUSCULAR | Status: AC
  Administered 2023-05-20: 50 mg via INTRAVENOUS
  Filled 2023-05-20: qty 1

## 2023-05-20 MED ORDER — FAMOTIDINE IN NACL 20-0.9 MG/50ML-% IV SOLN
20.0000 mg | Freq: Once | INTRAVENOUS | Status: AC
  Administered 2023-05-20: 20 mg via INTRAVENOUS
  Filled 2023-05-20: qty 50

## 2023-05-20 MED ORDER — SODIUM CHLORIDE 0.9 % IV SOLN
10.0000 mg/kg | Freq: Once | INTRAVENOUS | Status: AC
  Administered 2023-05-20: 900 mg via INTRAVENOUS
  Filled 2023-05-20: qty 40

## 2023-05-20 MED ORDER — ACETAMINOPHEN 325 MG PO TABS
650.0000 mg | ORAL_TABLET | Freq: Once | ORAL | Status: AC
Start: 2023-05-20 — End: 2023-05-20
  Administered 2023-05-20: 650 mg via ORAL
  Filled 2023-05-20: qty 2

## 2023-05-20 MED ORDER — SODIUM CHLORIDE 0.9 % IV SOLN
65.0000 mg/m2 | Freq: Once | INTRAVENOUS | Status: AC
  Administered 2023-05-20: 137 mg via INTRAVENOUS
  Filled 2023-05-20: qty 13.7

## 2023-05-20 NOTE — Patient Instructions (Signed)
 [facility] - A DEPT OF [facility]  Discharge Instructions: Thank you for choosing [facility] to provide your oncology and hematology care.   If you have a lab appointment with the Cancer Center, please go directly to the Cancer Center and check in at the registration area.   Wear comfortable clothing and clothing appropriate for easy access to any Portacath or PICC line.   We strive to give you quality time with your provider. You may need to reschedule your appointment if you arrive late (15 or more minutes).  Arriving late affects you and other patients whose appointments are after yours.  Also, if you miss three or more appointments without notifying the office, you may be dismissed from the clinic at the provider's discretion.      For prescription refill requests, have your pharmacy contact our office and allow 72 hours for refills to be completed.    Today you received the following chemotherapy and/or immunotherapy agents: Cyramza/Docetaxel      To help prevent nausea and vomiting after your treatment, we encourage you to take your nausea medication as directed.  BELOW ARE SYMPTOMS THAT SHOULD BE REPORTED IMMEDIATELY: *FEVER GREATER THAN 100.4 F (38 C) OR HIGHER *CHILLS OR SWEATING *NAUSEA AND VOMITING THAT IS NOT CONTROLLED WITH YOUR NAUSEA MEDICATION *UNUSUAL SHORTNESS OF BREATH *UNUSUAL BRUISING OR BLEEDING *URINARY PROBLEMS (pain or burning when urinating, or frequent urination) *BOWEL PROBLEMS (unusual diarrhea, constipation, pain near the anus) TENDERNESS IN MOUTH AND THROAT WITH OR WITHOUT PRESENCE OF ULCERS (sore throat, sores in mouth, or a toothache) UNUSUAL RASH, SWELLING OR PAIN  UNUSUAL VAGINAL DISCHARGE OR ITCHING   Items with * indicate a potential emergency and should be followed up as soon as possible or go to the Emergency Department if any problems should occur.  Please show the CHEMOTHERAPY ALERT CARD or  IMMUNOTHERAPY ALERT CARD at check-in to the Emergency Department and triage nurse.  Should you have questions after your visit or need to cancel or reschedule your appointment, please contact [facility] - A DEPT OF [facility]  Dept: [phone]  and follow the prompts.  Office hours are 8:00 a.m. to 4:30 p.m. Monday - Friday. Please note that voicemails left after 4:00 p.m. may not be returned until the following business day.  We are closed weekends and major holidays. You have access to a nurse at all times for urgent questions. Please call the main number to the clinic Dept: [phone] and follow the prompts.   For any non-urgent questions, you may also contact your provider using MyChart. We now offer e-Visits for anyone 92 and older to request care online for non-urgent symptoms. For details visit [email or web address].   Also download the MyChart app! Go to the app store, search "MyChart", open the app, select [facility], and log in with your MyChart username and password.

## 2023-05-20 NOTE — Progress Notes (Signed)
 [facility] [phone]   [phone]  OFFICE PROGRESS NOTE  [name], MD [address] [place] [address]  DIAGNOSIS: Metastatic non-small cell lung cancer initially diagnosed as stage IIIA (T4, N0, M0) non-small cell lung cancer, favoring adenocarcinoma presented with large right lower lobe lung mass with no mediastinal lymphadenopathy or extrathoracic metastasis diagnosed in October 2022. Had disease progression with enlarging mass and new right hilar and mediastinal lymph nodes in December 2022.      PRIOR THERAPY:  1) Neoadjuvant treatment with carboplatin for AUC of 5, Alimta 500 Mg/M2 and nivolumab 360 Mg IV every 3 weeks.  First dose January 23, 2021.  Status post 3 cycles. 2) Concurrent chemoradiation with carboplatin for an AUC of 2 and paclitaxel 45 mg per metered squared.  First dose expected on 04/01/2021.  Status post 4 cycles 3) Consolidation treatment with immunotherapy with Imfinzi 1500 Mg IV every 4 weeks.  First dose on Jul 08, 2021.  Status post 9 cycles.  4) Palliative systemic chemotherapy with carboplatin for an AUC of 5, Alimta 500 mg/m, Keytruda 200 mg IV every 3 weeks. First dose on 05/13/2022.  Status post 12 cycles.  Starting him cycle #3 his dose of carboplatin will be reduced to AUC of 4 and Alimta 400 Mg/M2 secondary to intolerance.  Last dose was given January 26, 2023 discontinued secondary to disease progression.    CURRENT THERAPY: Second line systemic chemotherapy with docetaxel 65 Mg/M2 and ramucirumab 10 Mg/KG every 3 weeks with Neulasta support.  First dose April 08, 2023.  INTERVAL HISTORY: Brandon Rubio. 73 y.o. male returns to the clinic today for follow-up visit.Discussed the use of AI scribe software for clinical note transcription with the patient, who gave verbal consent to proceed.  History of Present Illness   The patient is a 73 year old with metastatic non-small cell lung  cancer who presents with side effects from chemotherapy and supportive treatments.  He is undergoing treatment for metastatic non-small cell lung cancer, adenocarcinoma, initially diagnosed in October 2022. He is currently on a reduced dose of docetaxel at 65 mg/m2 and ramucirumab at 10 mg/kg every three weeks, with Neulasta support. He has completed two cycles of this regimen.  He experiences significant side effects following the last cycle of treatment, particularly after receiving the Neulasta injection. He has severe fatigue, body aches, and chills, which are debilitating enough to prevent him from leaving the house for two weeks. The pain starts in the small of his back and radiates upwards, and he considered calling an ambulance due to the severity. His eyes have been watering persistently, and he notes that yesterday and today are the best he has felt since the last treatment.  He recalls an allergic reaction to a medication during his first treatment, which was managed with premedication. He is currently taking Pepcid and Silanetriol as part of his premedication regimen. He also takes Decadron, two tablets in the morning and two in the evening, the day before, the day of, and the day after treatment. Additionally, he is on daily prednisone for other conditions.  He mentions a concern about a misreading of his lab results, specifically regarding his pancreas, but he does not have detailed information. His granddaughter informed him of this issue. He also reports difficulty urinating during a recent test for protein in his urine.        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  MEDICATIONS:  Current Outpatient Medications  Medication Sig Dispense Refill   aspirin EC 81 MG tablet Take 81 mg by mouth in the morning. Swallow whole.     bisacodyl (DULCOLAX) 5 MG EC tablet Take 5 mg by mouth daily as needed for moderate constipation.     dexamethasone (DECADRON) 4 MG tablet 2 tablet p.o. twice daily the day before, day of and day after chemotherapy every 3 weeks 60 tablet 0   folic acid (FOLVITE) 1 MG tablet TAKE 1 TABLET BY MOUTH EVERY DAY 30 tablet 2   HYDROcodone-acetaminophen (NORCO) 10-325 MG tablet Take 1 tablet by mouth every 12 (twelve) hours as needed. 60 tablet 0   Magnesium Gluconate 550 MG TABS Take 1 tablet (550 mg total) by mouth 2 (two) times daily. 60 tablet 3   metFORMIN (GLUCOPHAGE) 1000 MG tablet TAKE 1 TABLET(1000 MG) BY MOUTH TWICE DAILY WITH A MEAL 180 tablet 1   metoprolol tartrate (LOPRESSOR) 25 MG tablet Take 1 tablet (25 mg total) by mouth 2 (two) times daily. 180 tablet 2   Multiple Vitamins-Minerals (CENTRUM SILVER 50+MEN) TABS Take 1 tablet by mouth daily with breakfast.     omeprazole (PRILOSEC) 20 MG capsule TAKE 1 CAPSULE BY MOUTH TWICE DAILY BEFORE A MEAL 180 capsule 1   ondansetron (ZOFRAN) 8 MG tablet Take 1 tablet (8 mg total) by mouth every 8 (eight) hours as needed for nausea or vomiting. 30 tablet 2   predniSONE (DELTASONE) 5 MG tablet Take 1-1.5 tablets (5-7.5 mg total) by mouth daily with breakfast. (Patient taking differently: Take 7.5 mg by mouth daily with breakfast.) 135 tablet 3   prochlorperazine (COMPAZINE) 10 MG tablet TAKE 1 TABLET(10 MG) BY MOUTH EVERY 6 HOURS FOR UP TO 14 DAYS AS NEEDED FOR NAUSEA OR VOMITING 60 tablet 1   simvastatin (ZOCOR) 40 MG tablet TAKE 1 TABLET BY MOUTH EVERY EVENING AT 6 PM. (Patient taking  differently: Take 40 mg by mouth in the morning.) 90 tablet 3   No current facility-administered medications for this visit.    SURGICAL HISTORY:  Past Surgical History:  Procedure Laterality Date   BRONCHIAL BIOPSY  12/10/2020   Procedure: BRONCHIAL BIOPSIES;  Surgeon: Leslye Peer, MD;  Location: [facility] ENDOSCOPY;  Service: Pulmonary;;   BRONCHIAL BRUSHINGS  12/10/2020   Procedure: BRONCHIAL BRUSHINGS;  Surgeon: Leslye Peer, MD;  Location: [facility] ENDOSCOPY;  Service: Pulmonary;;   BRONCHIAL NEEDLE ASPIRATION BIOPSY  12/10/2020   Procedure: BRONCHIAL NEEDLE ASPIRATION BIOPSIES;  Surgeon: Leslye Peer, MD;  Location: [facility] ENDOSCOPY;  Service: Pulmonary;;   COLONOSCOPY     COLONOSCOPY W/ POLYPECTOMY  03/10/2009   LARYNGOSCOPY  03/10/1997   VIDEO BRONCHOSCOPY WITH ENDOBRONCHIAL NAVIGATION N/A 12/10/2020   Procedure: ROBOTIC VIDEO BRONCHOSCOPY WITH ENDOBRONCHIAL NAVIGATION;  Surgeon: Leslye Peer, MD;  Location: [facility] ENDOSCOPY;  Service: Pulmonary;  Laterality: N/A;    REVIEW OF SYSTEMS:  Constitutional:  positive for fatigue Eyes: negative Ears, nose, mouth, throat, and face: negative Respiratory: positive for dyspnea on exertion Cardiovascular: negative Gastrointestinal: negative Genitourinary:negative Integument/breast: negative Hematologic/lymphatic: negative Musculoskeletal:negative Neurological: negative Behavioral/Psych: negative Endocrine: negative Allergic/Immunologic: negative   PHYSICAL EXAMINATION: General appearance: alert, cooperative, fatigued, and no distress Head: Normocephalic, without obvious abnormality, atraumatic Neck: no adenopathy, no JVD, supple, symmetrical, trachea midline, and thyroid not enlarged, symmetric, no tenderness/mass/nodules Lymph nodes: Cervical, supraclavicular, and axillary nodes normal. Resp: clear to auscultation bilaterally Back: symmetric, no curvature. ROM normal. No CVA tenderness. Cardio: regular rate and rhythm, S1, S2 normal,  no murmur, click, rub or gallop GI: soft, non-tender; bowel sounds normal; no masses,  no organomegaly Extremities: extremities normal, atraumatic, no cyanosis or edema Neurologic: Alert and oriented X 3, normal strength and tone. Normal symmetric reflexes. Normal coordination and gait  ECOG PERFORMANCE STATUS: 1 - Symptomatic but completely ambulatory  Blood pressure 116/72, pulse 82, temperature (!) 97.3 F (36.3 C), temperature source Temporal, resp. rate 16, height 6' (1.829 m), weight 192 lb 8 oz (87.3 kg), SpO2 98%.  LABORATORY DATA: Lab Results  Component Value Date   WBC 16.9 (H) 05/20/2023   HGB 11.4 (L) 05/20/2023   HCT 34.3 (L) 05/20/2023   MCV 94.2 05/20/2023   PLT 247 05/20/2023      Chemistry      Component Value Date/Time   NA 142 05/13/2023 1012   NA 137 10/24/2022 1540   K 3.5 05/13/2023 1012   CL 105 05/13/2023 1012   CO2 27 05/13/2023 1012   BUN 14 05/13/2023 1012   BUN 9 10/24/2022 1540   CREATININE 0.98 05/13/2023 1012      Component Value Date/Time   CALCIUM 8.5 (L) 05/13/2023 1012   ALKPHOS 80 05/13/2023 1012   AST 15 05/13/2023 1012   ALT 12 05/13/2023 1012   BILITOT 0.4 05/13/2023 1012       RADIOGRAPHIC STUDIES: No results found.   ASSESSMENT AND PLAN: This is a very pleasant 74 years old white male with history of a stage IIIA (T4, N1, M0) non-small cell lung cancer, adenocarcinoma presented with right lower lobe lung mass with a right hilar and no mediastinal lymphadenopathy and no evidence of extrathoracic disease.  The patient was diagnosed in October 2022 status post 3 cycles of neoadjuvant systemic chemotherapy with carboplatin, Alimta and nivolumab but unfortunately has no evidence for significant improvement of his disease and actually has some evidence for progression. The patient underwent a course of concurrent chemoradiation weekly carboplatin for AUC of 2 and paclitaxel 45 Mg/M2.  Status post 4 cycles. The patient had a rough time  with the course of concurrent chemoradiation and he was hospitalized several times during this course. Imaging studies after the course of concurrent chemoradiation showed no concerning findings for disease progression. The patient underwent treatment with immunotherapy with Imfinzi 1500 Mg IV every 4 weeks status post 9 cycles.  This treatment was discontinued secondary to disease progression. The patient is currently on systemic chemotherapy with carboplatin, Alimta and Keytruda status post 12 cycles.  Starting from cycle #5 he has been on treatment with maintenance Alimta and Keytruda every 3 weeks.  The patient has been tolerating this treatment fairly well.  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  Repeat CT scan of the chest showed the diffuse opacity in the lungs bilaterally more on the left suspicious for disease  progression rather than immunotherapy mediated pneumonitis. The patient had a PET scan performed recently and it showed multifocal adenocarcinoma progressive from comparison examination. He is currently on systemic chemotherapy with reduced dose docetaxel 65 Mg/M2 and ramucirumab 10 Mg/KG every 3 weeks status post 2 cycles with Neulasta support. Assessment and Plan    Metastatic non-small cell lung cancer, adenocarcinoma Metastatic non-small cell lung cancer, adenocarcinoma, initially diagnosed in October 2022. Currently on reduced dose docetaxel (65 mg/m2) and ramucirumab (10 mg/kg) every three weeks with Neulasta support. Reports significant side effects, including fatigue, musculoskeletal pain, chills, and watery eyes, likely related to Neulasta injection rather than chemotherapy. Discussed potential side effects of docetaxel, including watery eyes and runny nose. Plan to skip Neulasta injection this cycle to assess if symptoms are due to the injection or chemotherapy.  Explained that chemotherapy agents are excreted from the body within 24 to 48 hours. - Administer chemotherapy without Neulasta injection this cycle - Continue docetaxel and ramucirumab regimen - Monitor for side effects and adjust treatment as necessary  Allergic reaction to premedication Allergic reaction to premedication during initial treatment. Premedication includes Pepcid and Silanetriol to prevent allergic reactions. Concerned about the duration and effect of premedication. Explained that premedication is intended to prevent allergic reactions during infusion and does not persist long-term. Premedication prevents allergic reactions by inhibiting allergic cells during infusion. - Continue premedication regimen with Pepcid and Silanetriol - Educate on the purpose and duration of premedication  Elevated blood glucose Elevated blood glucose likely due to current treatment regimen, including steroids. On prednisone for other conditions and Decadron as part of chemotherapy premedication. Discussed expected changes in lab values due to treatment. - Monitor blood glucose levels - Adjust steroid regimen as needed, with Decadron 8 mg twice daily for three days around chemotherapy  Follow-up Lab results show elevated white blood count due to Neulasta injection and steroids. No concerning findings in recent labs. Expressed confusion about lab results and appointment schedule. Reassured that unless contacted, lab results are not concerning. - Cancel Neulasta injection appointment for Friday - Reassure about lab results and provide education on expected changes due to treatment - Schedule regular follow-up appointments to monitor treatment progress     He will have repeat CT scan of the chest, abdomen and pelvis in 2 weeks for restaging of his disease before the next visit and treatment in 3 weeks. The patient was advised to call if he has any other concerning symptoms in the interval.  The patient  voices understanding of current disease status and treatment options and is in agreement with the current care plan.  All questions were answered. The patient knows to call the clinic with any problems, questions or concerns. We can certainly see the patient much sooner if necessary.   Disclaimer: This note was dictated with voice recognition software. Similar sounding words can inadvertently be transcribed and may not be corrected upon review.

## 2023-05-21 LAB — T4: T4, Total: 9.1 ug/dL (ref 4.5–12.0)

## 2023-05-22 ENCOUNTER — Inpatient Hospital Stay: Payer: Medicare Other

## 2023-05-27 ENCOUNTER — Other Ambulatory Visit: Payer: Self-pay | Admitting: Physician Assistant

## 2023-05-27 ENCOUNTER — Inpatient Hospital Stay: Payer: Medicare Other

## 2023-05-27 ENCOUNTER — Telehealth: Payer: Self-pay

## 2023-05-27 DIAGNOSIS — C3431 Malignant neoplasm of lower lobe, right bronchus or lung: Secondary | ICD-10-CM | POA: Diagnosis not present

## 2023-05-27 DIAGNOSIS — Z5112 Encounter for antineoplastic immunotherapy: Secondary | ICD-10-CM | POA: Diagnosis not present

## 2023-05-27 DIAGNOSIS — Z5111 Encounter for antineoplastic chemotherapy: Secondary | ICD-10-CM | POA: Diagnosis not present

## 2023-05-27 DIAGNOSIS — C3490 Malignant neoplasm of unspecified part of unspecified bronchus or lung: Secondary | ICD-10-CM

## 2023-05-27 DIAGNOSIS — Z923 Personal history of irradiation: Secondary | ICD-10-CM | POA: Diagnosis not present

## 2023-05-27 DIAGNOSIS — E1165 Type 2 diabetes mellitus with hyperglycemia: Secondary | ICD-10-CM | POA: Diagnosis not present

## 2023-05-27 DIAGNOSIS — Z79899 Other long term (current) drug therapy: Secondary | ICD-10-CM | POA: Diagnosis not present

## 2023-05-27 LAB — CMP (CANCER CENTER ONLY)
ALT: 18 U/L (ref 0–44)
AST: 17 U/L (ref 15–41)
Albumin: 3.6 g/dL (ref 3.5–5.0)
Alkaline Phosphatase: 59 U/L (ref 38–126)
Anion gap: 8 (ref 5–15)
BUN: 10 mg/dL (ref 8–23)
CO2: 27 mmol/L (ref 22–32)
Calcium: 8.3 mg/dL — ABNORMAL LOW (ref 8.9–10.3)
Chloride: 103 mmol/L (ref 98–111)
Creatinine: 0.81 mg/dL (ref 0.61–1.24)
GFR, Estimated: >60 mL/min (ref >60)
Glucose, Bld: 150 mg/dL — ABNORMAL HIGH (ref 70–99)
Potassium: 4.2 mmol/L (ref 3.5–5.1)
Sodium: 138 mmol/L (ref 135–145)
Total Bilirubin: 0.6 mg/dL (ref 0.0–1.2)
Total Protein: 6.3 g/dL — ABNORMAL LOW (ref 6.5–8.1)

## 2023-05-27 LAB — CBC WITH DIFFERENTIAL (CANCER CENTER ONLY)
Abs Immature Granulocytes: 0.02 10*3/uL (ref 0.00–0.07)
Basophils Absolute: 0.1 10*3/uL (ref 0.0–0.1)
Basophils Relative: 2 %
Eosinophils Absolute: 0 10*3/uL (ref 0.0–0.5)
Eosinophils Relative: 1 %
HCT: 33.7 % — ABNORMAL LOW (ref 39.0–52.0)
Hemoglobin: 11.2 g/dL — ABNORMAL LOW (ref 13.0–17.0)
Immature Granulocytes: 1 %
Lymphocytes Relative: 39 %
Lymphs Abs: 0.8 10*3/uL (ref 0.7–4.0)
MCH: 31.5 pg (ref 26.0–34.0)
MCHC: 33.2 g/dL (ref 30.0–36.0)
MCV: 94.7 fL (ref 80.0–100.0)
Monocytes Absolute: 0.1 10*3/uL (ref 0.1–1.0)
Monocytes Relative: 7 %
Neutro Abs: 1.1 10*3/uL — ABNORMAL LOW (ref 1.7–7.7)
Neutrophils Relative %: 50 %
Platelet Count: 113 10*3/uL — ABNORMAL LOW (ref 150–400)
RBC: 3.56 MIL/uL — ABNORMAL LOW (ref 4.22–5.81)
RDW: 13.8 % (ref 11.5–15.5)
WBC Count: 2.1 10*3/uL — ABNORMAL LOW (ref 4.0–10.5)
nRBC: 0 % (ref 0.0–0.2)

## 2023-05-27 NOTE — Telephone Encounter (Signed)
 Spoke with patient in regards to lab results.  Per Cassie, PA- call patient and review neutropenic precautions.  Reviewed with patient. Informed  to call if any new symptoms. Patient verbalized understanding.

## 2023-06-03 ENCOUNTER — Other Ambulatory Visit: Payer: Self-pay | Admitting: Internal Medicine

## 2023-06-03 ENCOUNTER — Ambulatory Visit (HOSPITAL_COMMUNITY)
Admission: RE | Admit: 2023-06-03 | Discharge: 2023-06-03 | Disposition: A | Discharge status: Home / Self Care | Source: Ambulatory Visit | Attending: Internal Medicine | Admitting: Internal Medicine

## 2023-06-03 ENCOUNTER — Inpatient Hospital Stay: Payer: Medicare Other

## 2023-06-03 DIAGNOSIS — C3431 Malignant neoplasm of lower lobe, right bronchus or lung: Secondary | ICD-10-CM | POA: Diagnosis not present

## 2023-06-03 DIAGNOSIS — C3490 Malignant neoplasm of unspecified part of unspecified bronchus or lung: Secondary | ICD-10-CM | POA: Insufficient documentation

## 2023-06-03 DIAGNOSIS — E1165 Type 2 diabetes mellitus with hyperglycemia: Secondary | ICD-10-CM | POA: Diagnosis not present

## 2023-06-03 DIAGNOSIS — Z79899 Other long term (current) drug therapy: Secondary | ICD-10-CM | POA: Diagnosis not present

## 2023-06-03 DIAGNOSIS — Z5112 Encounter for antineoplastic immunotherapy: Secondary | ICD-10-CM | POA: Diagnosis not present

## 2023-06-03 DIAGNOSIS — K760 Fatty (change of) liver, not elsewhere classified: Secondary | ICD-10-CM | POA: Diagnosis not present

## 2023-06-03 DIAGNOSIS — Z923 Personal history of irradiation: Secondary | ICD-10-CM | POA: Diagnosis not present

## 2023-06-03 DIAGNOSIS — I723 Aneurysm of iliac artery: Secondary | ICD-10-CM | POA: Diagnosis not present

## 2023-06-03 DIAGNOSIS — Z5111 Encounter for antineoplastic chemotherapy: Secondary | ICD-10-CM | POA: Diagnosis not present

## 2023-06-03 LAB — CMP (CANCER CENTER ONLY)
ALT: 12 U/L (ref 0–44)
AST: 16 U/L (ref 15–41)
Albumin: 3.9 g/dL (ref 3.5–5.0)
Alkaline Phosphatase: 56 U/L (ref 38–126)
Anion gap: 11 (ref 5–15)
BUN: 10 mg/dL (ref 8–23)
CO2: 26 mmol/L (ref 22–32)
Calcium: 9.4 mg/dL (ref 8.9–10.3)
Chloride: 103 mmol/L (ref 98–111)
Creatinine: 1.03 mg/dL (ref 0.61–1.24)
GFR, Estimated: >60 mL/min (ref >60)
Glucose, Bld: 152 mg/dL — ABNORMAL HIGH (ref 70–99)
Potassium: 4 mmol/L (ref 3.5–5.1)
Sodium: 140 mmol/L (ref 135–145)
Total Bilirubin: 0.5 mg/dL (ref 0.0–1.2)
Total Protein: 7 g/dL (ref 6.5–8.1)

## 2023-06-03 LAB — CBC WITH DIFFERENTIAL (CANCER CENTER ONLY)
Abs Immature Granulocytes: 0.03 10*3/uL (ref 0.00–0.07)
Basophils Absolute: 0.1 10*3/uL (ref 0.0–0.1)
Basophils Relative: 3 %
Eosinophils Absolute: 0 10*3/uL (ref 0.0–0.5)
Eosinophils Relative: 1 %
HCT: 35.5 % — ABNORMAL LOW (ref 39.0–52.0)
Hemoglobin: 11.8 g/dL — ABNORMAL LOW (ref 13.0–17.0)
Immature Granulocytes: 1 %
Lymphocytes Relative: 32 %
Lymphs Abs: 1.4 10*3/uL (ref 0.7–4.0)
MCH: 30.6 pg (ref 26.0–34.0)
MCHC: 33.2 g/dL (ref 30.0–36.0)
MCV: 92.2 fL (ref 80.0–100.0)
Monocytes Absolute: 1 10*3/uL (ref 0.1–1.0)
Monocytes Relative: 22 %
Neutro Abs: 1.8 10*3/uL (ref 1.7–7.7)
Neutrophils Relative %: 41 %
Platelet Count: 201 10*3/uL (ref 150–400)
RBC: 3.85 MIL/uL — ABNORMAL LOW (ref 4.22–5.81)
RDW: 13.9 % (ref 11.5–15.5)
WBC Count: 4.4 10*3/uL (ref 4.0–10.5)
nRBC: 0 % (ref 0.0–0.2)

## 2023-06-03 MED ORDER — IOHEXOL 300 MG/ML  SOLN
100.0000 mL | Freq: Once | INTRAMUSCULAR | Status: AC | PRN
  Administered 2023-06-03: 100 mL via INTRAVENOUS

## 2023-06-10 ENCOUNTER — Inpatient Hospital Stay (HOSPITAL_BASED_OUTPATIENT_CLINIC_OR_DEPARTMENT_OTHER): Payer: Medicare Other | Admitting: Internal Medicine

## 2023-06-10 ENCOUNTER — Inpatient Hospital Stay: Payer: Medicare Other | Attending: Internal Medicine

## 2023-06-10 ENCOUNTER — Inpatient Hospital Stay: Payer: Medicare Other

## 2023-06-10 VITALS — BP 134/94 | HR 82 | Temp 97.7°F | Resp 17 | Ht 72.0 in | Wt 195.2 lb

## 2023-06-10 DIAGNOSIS — C3491 Malignant neoplasm of unspecified part of right bronchus or lung: Secondary | ICD-10-CM

## 2023-06-10 DIAGNOSIS — C3431 Malignant neoplasm of lower lobe, right bronchus or lung: Secondary | ICD-10-CM | POA: Diagnosis not present

## 2023-06-10 DIAGNOSIS — Z79899 Other long term (current) drug therapy: Secondary | ICD-10-CM | POA: Insufficient documentation

## 2023-06-10 DIAGNOSIS — Z5112 Encounter for antineoplastic immunotherapy: Secondary | ICD-10-CM | POA: Diagnosis not present

## 2023-06-10 DIAGNOSIS — Z5111 Encounter for antineoplastic chemotherapy: Secondary | ICD-10-CM | POA: Diagnosis not present

## 2023-06-10 LAB — CMP (CANCER CENTER ONLY)
ALT: 10 U/L (ref 0–44)
AST: 12 U/L — ABNORMAL LOW (ref 15–41)
Albumin: 3.9 g/dL (ref 3.5–5.0)
Alkaline Phosphatase: 44 U/L (ref 38–126)
Anion gap: 12 (ref 5–15)
BUN: 22 mg/dL (ref 8–23)
CO2: 22 mmol/L (ref 22–32)
Calcium: 8.9 mg/dL (ref 8.9–10.3)
Chloride: 107 mmol/L (ref 98–111)
Creatinine: 0.98 mg/dL (ref 0.61–1.24)
GFR, Estimated: >60 mL/min (ref >60)
Glucose, Bld: 184 mg/dL — ABNORMAL HIGH (ref 70–99)
Potassium: 4.1 mmol/L (ref 3.5–5.1)
Sodium: 141 mmol/L (ref 135–145)
Total Bilirubin: 0.4 mg/dL (ref 0.0–1.2)
Total Protein: 6.7 g/dL (ref 6.5–8.1)

## 2023-06-10 LAB — CBC WITH DIFFERENTIAL (CANCER CENTER ONLY)
Abs Immature Granulocytes: 0.27 10*3/uL — ABNORMAL HIGH (ref 0.00–0.07)
Basophils Absolute: 0 10*3/uL (ref 0.0–0.1)
Basophils Relative: 0 %
Eosinophils Absolute: 0 10*3/uL (ref 0.0–0.5)
Eosinophils Relative: 0 %
HCT: 34.5 % — ABNORMAL LOW (ref 39.0–52.0)
Hemoglobin: 11.6 g/dL — ABNORMAL LOW (ref 13.0–17.0)
Immature Granulocytes: 2 %
Lymphocytes Relative: 7 %
Lymphs Abs: 1.3 10*3/uL (ref 0.7–4.0)
MCH: 31.3 pg (ref 26.0–34.0)
MCHC: 33.6 g/dL (ref 30.0–36.0)
MCV: 93 fL (ref 80.0–100.0)
Monocytes Absolute: 1 10*3/uL (ref 0.1–1.0)
Monocytes Relative: 6 %
Neutro Abs: 15.3 10*3/uL — ABNORMAL HIGH (ref 1.7–7.7)
Neutrophils Relative %: 85 %
Platelet Count: 220 10*3/uL (ref 150–400)
RBC: 3.71 MIL/uL — ABNORMAL LOW (ref 4.22–5.81)
RDW: 14.2 % (ref 11.5–15.5)
WBC Count: 17.9 10*3/uL — ABNORMAL HIGH (ref 4.0–10.5)
nRBC: 0 % (ref 0.0–0.2)

## 2023-06-10 MED ORDER — SODIUM CHLORIDE 0.9 % IV SOLN
10.0000 mg/kg | Freq: Once | INTRAVENOUS | Status: AC
  Administered 2023-06-10: 900 mg via INTRAVENOUS
  Filled 2023-06-10: qty 40

## 2023-06-10 MED ORDER — FAMOTIDINE IN NACL 20-0.9 MG/50ML-% IV SOLN
20.0000 mg | Freq: Once | INTRAVENOUS | Status: AC
Start: 2023-06-10 — End: 2023-06-10
  Administered 2023-06-10: 20 mg via INTRAVENOUS
  Filled 2023-06-10: qty 50

## 2023-06-10 MED ORDER — SODIUM CHLORIDE 0.9 % IV SOLN: INTRAVENOUS | Status: DC

## 2023-06-10 MED ORDER — DEXAMETHASONE SODIUM PHOSPHATE 10 MG/ML IJ SOLN
10.0000 mg | Freq: Once | INTRAMUSCULAR | Status: AC
Start: 2023-06-10 — End: 2023-06-10
  Administered 2023-06-10: 10 mg via INTRAVENOUS
  Filled 2023-06-10: qty 1

## 2023-06-10 MED ORDER — SODIUM CHLORIDE 0.9 % IV SOLN
65.0000 mg/m2 | Freq: Once | INTRAVENOUS | Status: AC
  Administered 2023-06-10: 137 mg via INTRAVENOUS
  Filled 2023-06-10: qty 13.7

## 2023-06-10 MED ORDER — ACETAMINOPHEN 325 MG PO TABS
650.0000 mg | ORAL_TABLET | Freq: Once | ORAL | Status: AC
  Administered 2023-06-10: 650 mg via ORAL
  Filled 2023-06-10: qty 2

## 2023-06-10 MED ORDER — DIPHENHYDRAMINE HCL 50 MG/ML IJ SOLN
50.0000 mg | Freq: Once | INTRAMUSCULAR | Status: AC
  Administered 2023-06-10: 50 mg via INTRAVENOUS
  Filled 2023-06-10: qty 1

## 2023-06-10 NOTE — Progress Notes (Signed)
 [facility] [phone]   [phone]  OFFICE PROGRESS NOTE  [name], MD [address] [place] [address]  DIAGNOSIS: Metastatic non-small cell lung cancer initially diagnosed as stage IIIA (T4, N0, M0) non-small cell lung cancer, favoring adenocarcinoma presented with large right lower lobe lung mass with no mediastinal lymphadenopathy or extrathoracic metastasis diagnosed in October 2022. Had disease progression with enlarging mass and new right hilar and mediastinal lymph nodes in December 2022.      PRIOR THERAPY:  1) Neoadjuvant treatment with carboplatin for AUC of 5, Alimta 500 Mg/M2 and nivolumab 360 Mg IV every 3 weeks.  First dose January 23, 2021.  Status post 3 cycles. 2) Concurrent chemoradiation with carboplatin for an AUC of 2 and paclitaxel 45 mg per metered squared.  First dose expected on 04/01/2021.  Status post 4 cycles 3) Consolidation treatment with immunotherapy with Imfinzi 1500 Mg IV every 4 weeks.  First dose on Jul 08, 2021.  Status post 9 cycles.  4) Palliative systemic chemotherapy with carboplatin for an AUC of 5, Alimta 500 mg/m, Keytruda 200 mg IV every 3 weeks. First dose on 05/13/2022.  Status post 12 cycles.  Starting him cycle #3 his dose of carboplatin will be reduced to AUC of 4 and Alimta 400 Mg/M2 secondary to intolerance.  Last dose was given January 26, 2023 discontinued secondary to disease progression.    CURRENT THERAPY: Second line systemic chemotherapy with docetaxel 65 Mg/M2 and ramucirumab 10 Mg/KG every 3 weeks with Neulasta support.  First dose April 08, 2023.  Status post 3 cycles.  INTERVAL HISTORY: Brandon Rubio. 73 y.o. male returns to the clinic today for follow-up visit.Discussed the use of AI scribe software for clinical note transcription with the patient, who gave verbal consent to proceed.  History of Present Illness   Brandon Rubio. is a 73 year old male  with metastatic non-small cell lung cancer who presents for evaluation before starting cycle number four of chemotherapy.  He is undergoing treatment for metastatic non-small cell lung cancer, favoring adenocarcinoma, initially diagnosed in October 2022. He is currently on second-line systemic chemotherapy with reduced dose docetaxel (65 mg/m2) and ramucirumab (10 mg/kg) every three weeks. He has completed three cycles and is here for evaluation before starting the fourth cycle, including a repeat CT scan of the chest, abdomen, and pelvis for restaging.  Following the last chemotherapy cycle, he experienced severe symptoms two weeks post-treatment, including pain in the stomach, thighs, and hips, which significantly impaired his mobility. He described the experience as 'terrible' and noted that he 'couldn't walk.' To manage these symptoms, he self-administered dexamethasone (Decadron) twice daily for three to four days, which provided relief. He did not receive the Neulasta injection after the last cycle, and his blood counts remained stable. Typically, chemotherapy side effects manifest around a week after treatment, but this time the severe symptoms occurred two weeks later.  He mentions feeling significantly better in the days leading up to this visit, stating he felt 'as good as I felt in months.' He also notes difficulty with physical activity due to pain in his thighs and hips, which has limited his ability to perform daily activities such as checking the mail.        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  MEDICATIONS:  Current Outpatient Medications  Medication Sig Dispense Refill   aspirin EC 81 MG tablet Take 81 mg by mouth in the morning. Swallow whole.     bisacodyl (DULCOLAX) 5 MG EC tablet Take 5 mg by mouth daily as needed for moderate constipation.     dexamethasone (DECADRON) 4 MG tablet 2 tablet p.o. twice daily the day before, day of and day after chemotherapy every 3 weeks 60 tablet 0   folic acid (FOLVITE) 1 MG tablet TAKE 1 TABLET BY MOUTH EVERY DAY 30 tablet 2   HYDROcodone-acetaminophen (NORCO) 10-325 MG tablet Take 1 tablet by mouth every 12 (twelve) hours as needed. 60 tablet 0   Magnesium Gluconate 550 MG TABS Take 1 tablet (550 mg total) by mouth 2 (two) times daily. 60 tablet 3   metFORMIN (GLUCOPHAGE) 1000 MG tablet TAKE 1 TABLET(1000 MG) BY MOUTH TWICE DAILY WITH A MEAL 180 tablet 1   metoprolol tartrate (LOPRESSOR) 25 MG tablet Take 1 tablet (25 mg total) by mouth 2 (two) times daily. 180 tablet 2   Multiple Vitamins-Minerals (CENTRUM SILVER 50+MEN) TABS Take 1 tablet by mouth daily with breakfast.     omeprazole (PRILOSEC) 20 MG capsule TAKE 1 CAPSULE BY MOUTH TWICE DAILY BEFORE A MEAL 180 capsule 1   ondansetron (ZOFRAN) 8 MG tablet Take 1 tablet (8 mg total) by mouth every 8 (eight) hours as needed for nausea or vomiting. 30 tablet 2   predniSONE (DELTASONE) 5 MG tablet Take 1-1.5 tablets (5-7.5 mg total) by mouth daily with breakfast. (Patient taking differently: Take 7.5 mg by mouth daily with breakfast.) 135 tablet 3   prochlorperazine (COMPAZINE) 10 MG tablet TAKE 1 TABLET(10 MG) BY MOUTH EVERY 6 HOURS FOR UP TO 14 DAYS AS NEEDED FOR NAUSEA OR VOMITING 60 tablet 1   simvastatin (ZOCOR) 40 MG tablet TAKE 1 TABLET BY MOUTH EVERY EVENING AT 6 PM. (Patient taking differently: Take 40 mg by mouth in the morning.) 90 tablet 3   No current  facility-administered medications for this visit.    SURGICAL HISTORY:  Past Surgical History:  Procedure Laterality Date   BRONCHIAL BIOPSY  12/10/2020   Procedure: BRONCHIAL BIOPSIES;  Surgeon: Leslye Peer, MD;  Location: [facility] ENDOSCOPY;  Service: Pulmonary;;   BRONCHIAL BRUSHINGS  12/10/2020   Procedure: BRONCHIAL BRUSHINGS;  Surgeon: Leslye Peer, MD;  Location: [facility] ENDOSCOPY;  Service: Pulmonary;;   BRONCHIAL NEEDLE ASPIRATION BIOPSY  12/10/2020   Procedure: BRONCHIAL NEEDLE ASPIRATION BIOPSIES;  Surgeon: Leslye Peer, MD;  Location[facility] ENDOSCOPY;  Service: Pulmonary;;   COLONOSCOPY     COLONOSCOPY W/ POLYPECTOMY  03/10/2009   LARYNGOSCOPY  03/10/1997   VIDEO BRONCHOSCOPY WITH ENDOBRONCHIAL NAVIGATION N/A 12/10/2020   Procedure: ROBOTIC VIDEO BRONCHOSCOPY WITH ENDOBRONCHIAL NAVIGATION;  Surgeon: Leslye Peer, MD;  Location: [facility] ENDOSCOPY;  Service: Pulmonary;  Laterality: N/A;    REVIEW OF SYSTEMS:  Constitutional: positive for fatigue Eyes: negative Ears, nose, mouth, throat, and face: negative Respiratory: negative Cardiovascular: negative Gastrointestinal: negative Genitourinary:negative Integument/breast: negative  Hematologic/lymphatic: negative Musculoskeletal:positive for arthralgias and muscle weakness Neurological: negative Behavioral/Psych: negative Endocrine: negative Allergic/Immunologic: negative   PHYSICAL EXAMINATION: General appearance: alert, cooperative, fatigued, and no distress Head: Normocephalic, without obvious abnormality, atraumatic Neck: no adenopathy, no JVD, supple, symmetrical, trachea midline, and thyroid not enlarged, symmetric, no tenderness/mass/nodules Lymph nodes: Cervical, supraclavicular, and axillary nodes normal. Resp: clear to auscultation bilaterally Back: symmetric, no curvature. ROM normal. No CVA tenderness. Cardio: regular rate and rhythm, S1, S2 normal, no murmur, click, rub or gallop GI: soft, non-tender; bowel  sounds normal; no masses,  no organomegaly Extremities: extremities normal, atraumatic, no cyanosis or edema Neurologic: Alert and oriented X 3, normal strength and tone. Normal symmetric reflexes. Normal coordination and gait  ECOG PERFORMANCE STATUS: 1 - Symptomatic but completely ambulatory  Blood pressure (!) 134/94, pulse 82, temperature 97.7 F (36.5 C), temperature source Temporal, resp. rate 17, height 6' (1.829 m), weight 195 lb 3.2 oz (88.5 kg), SpO2 98%.  LABORATORY DATA: Lab Results  Component Value Date   WBC 17.9 (H) 06/10/2023   HGB 11.6 (L) 06/10/2023   HCT 34.5 (L) 06/10/2023   MCV 93.0 06/10/2023   PLT 220 06/10/2023      Chemistry      Component Value Date/Time   NA 140 06/03/2023 1135   NA 137 10/24/2022 1540   K 4.0 06/03/2023 1135   CL 103 06/03/2023 1135   CO2 26 06/03/2023 1135   BUN 10 06/03/2023 1135   BUN 9 10/24/2022 1540   CREATININE 1.03 06/03/2023 1135      Component Value Date/Time   CALCIUM 9.4 06/03/2023 1135   ALKPHOS 56 06/03/2023 1135   AST 16 06/03/2023 1135   ALT 12 06/03/2023 1135   BILITOT 0.5 06/03/2023 1135       RADIOGRAPHIC STUDIES: CT CHEST ABDOMEN PELVIS W CONTRAST Result Date: 06/09/2023 CLINICAL DATA:  Non-small-cell lung cancer.  * Tracking Code: BO * EXAM: CT CHEST, ABDOMEN, AND PELVIS WITH CONTRAST TECHNIQUE: Multidetector CT imaging of the chest, abdomen and pelvis was performed following the standard protocol during bolus administration of intravenous contrast. RADIATION DOSE REDUCTION: This exam was performed according to the departmental dose-optimization program which includes automated exposure control, adjustment of the mA and/or kV according to patient size and/or use of iterative reconstruction technique. CONTRAST:  OMNIPAQUE IOHEXOL 300 MG/ML  SOLN COMPARISON:  PET-CT scan 03/20/2023.  Chest CT 03/09/2023 and older. FINDINGS: CT CHEST FINDINGS Cardiovascular: The thoracic aorta has a normal course and  caliber with partially calcified atherosclerotic plaque. Coronary artery calcifications are seen. Trace pericardial fluid. Heart is nonenlarged. Mediastinum/Nodes: Preserved thyroid gland. Slightly patulous thoracic esophagus. No discrete abnormal lymph node enlargement identified in the axillary regions, hilum or mediastinum. Lungs/Pleura: Centrilobular and paraseptal emphysematous changes are seen. There also scattered areas of interstitial septal thickening, scarring and fibrotic changes. In the right lower lobe once again is an area of consolidative opacity with some high-density material. Associated volume loss and adjacent pleural thickening and tiny effusion. The extent and distribution of this area is similar to previous there is some adjacent nodular ground-glass just medial to this area on series 4, image 102 which is also stable compared to older examinations. Similar foci posteriorly in the right upper lobe such as series 4, image 91. More bandlike area of opacity seen in the inferior aspect right upper lobe near the margin of the minor fissure. No new dominant right-sided lung lesion. Left lung also has scattered areas of nodular ground-glass. Largest is  seen in the left lower lobe and somewhat geographic in appearance. This also is similar to previous examination. On PET-CT this was measured at 4.0 x 3.2 cm. On the older standard CT this measured 7.4 x 3.6 cm. Today area measures 6.4 x 3.7 cm on series 4, image 84. Dominant area posteriorly in the lingula which on the remote CT scan from December 2024 head measured 3.6 x 1.6 cm and on today's examination when measured in a similar fashion 3.4 by 1.7 cm series 4, image 72. Other areas are also similar in distribution. No left-sided pleural effusion or pneumothorax. Musculoskeletal: Scattered degenerative changes seen along the spine. CT ABDOMEN PELVIS FINDINGS Hepatobiliary: Fatty liver infiltration. No space-occupying liver lesion. Gallbladder is  present. Patent portal vein. Pancreas: Moderate atrophy of the pancreas. No obvious mass. There is a tiny cystic area in the uncinate process measuring 7 mm. Unchanged from November 2024. Simple attention on follow-up for the patient's thoracic neoplasm. Spleen: Normal in size without focal abnormality. Adrenals/Urinary Tract: Adrenal glands are preserved. Moderate atrophy of the kidneys. No collecting system dilatation or perinephric fluid. Preserved contour to the urinary bladder. Left-sided renal cyst is seen measuring 4 cm in diameter with Hounsfield unit of 4. Bosniak 1 lesion. Unchanged. Stomach/Bowel: On this non oral contrast exam large bowel has a normal course and caliber with diffuse colonic stool. Normal retrocecal appendix in the right hemipelvis. Once again there is significant fold thickening along the stomach. Please correlate with symptoms of gastritis or other process. Small bowel is nondilated. Vascular/Lymphatic: Diffuse vascular calcifications. Normal caliber aorta and IVC. 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 This is not seen with changed from previous. There is some mild mural plaque and thrombus as well. This is just above the right common iliac bifurcation. No specific abnormal lymph node enlargement identified in the abdomen and pelvis. Reproductive: Mildly prominent prostate. Other: No free air or free fluid. Musculoskeletal: Degenerative changes seen of the spine and pelvis. IMPRESSION: Overall similar appearance to the previous examination. Confluent up consolidative opacity along the right lower lobe with volume loss, calcification and adjacent fluid with pleural thickening is again seen and similar. The bilateral ground-glass nodular areas in both lungs are again noted and have a similar distribution to previous examination. Going back to the prior standard CT scan of December 2024, some of these areas appear smaller. Others are  similar. No developing new mass lesion, fluid collection or lymph node enlargement. Fatty liver infiltration. Persistent gastric fold thickening. Please correlate with symptoms. 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 Electronically Signed   By: Karen Kays M.D.   On: 06/09/2023 16:39     ASSESSMENT AND PLAN: This is a very pleasant 73 years old white male with history of a stage IIIA (T4, N1, M0) non-small cell lung cancer, adenocarcinoma presented with right lower lobe lung mass with a right hilar and no mediastinal lymphadenopathy and no evidence of extrathoracic disease.  The patient was diagnosed in October 2022 status post 3 cycles of neoadjuvant systemic chemotherapy with carboplatin, Alimta and nivolumab but unfortunately has no evidence for significant improvement of his disease and actually has some evidence for progression. The patient underwent a course of concurrent chemoradiation weekly carboplatin for AUC of 2 and paclitaxel 45 Mg/M2.  Status post 4 cycles. The patient had a rough time with the course of concurrent chemoradiation and he was hospitalized several times  during this course. Imaging studies after the course of concurrent chemoradiation showed no concerning findings for disease progression. The patient underwent treatment with immunotherapy with Imfinzi 1500 Mg IV every 4 weeks status post 9 cycles.  This treatment was discontinued secondary to disease progression. The patient is currently on systemic chemotherapy with carboplatin, Alimta and Keytruda status post 12 cycles.  Starting from cycle #5 he has been on treatment with maintenance Alimta and Keytruda every 3 weeks.  The patient has been tolerating this treatment fairly well.  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  Repeat CT scan of the chest showed the diffuse  opacity in the lungs bilaterally more on the left suspicious for disease progression rather than immunotherapy mediated pneumonitis. The patient had a PET scan performed recently and it showed multifocal adenocarcinoma progressive from comparison examination. He is currently on systemic chemotherapy with reduced dose docetaxel 65 Mg/M2 and ramucirumab 10 Mg/KG every 3 weeks status post 3 cycles. The Neulasta injection was discontinued secondary to intolerance.     Metastatic non-small cell lung cancer (NSCLC) Metastatic NSCLC, favoring adenocarcinoma, initially diagnosed in October 2022. Currently on second-line systemic chemotherapy with reduced dose docetaxel and ramucirumab every three weeks. Post three cycles, recent CT scan shows no disease progression, indicating treatment efficacy. Reports severe side effects two weeks post-treatment, including pain in the stomach, thighs, and hips, improved with additional steroid use. No Neulasta injection was given last cycle, and blood counts remain stable. Treatment is effective but challenging. - Continue chemotherapy with docetaxel and ramucirumab - Omit Neulasta injection and monitor blood counts - Allow use of additional Decadron for a few days if severe side effects occur - Encourage exercise to maintain strength  Chemotherapy-induced side effects Severe side effects reported two weeks after chemotherapy, including pain in the stomach, thighs, and hips. Symptoms improved with additional Decadron. Timing of side effects is atypical, usually occurring one week post-treatment. Possible coincidental factors or interactions with other medications considered. Decadron confirmed to reduce some chemotherapy side effects. - Use Decadron 1 tablet in the morning and 1 at night for a few days if severe side effects occur  Follow-up Requires a new schedule for upcoming appointments and lab work. - Schedule lab work for April 9th at 10:00 AM - Provide updated  schedule   He was advised to call immediately if he has any other concerning symptoms in the interval. The patient voices understanding of current disease status and treatment options and is in agreement with the current care plan.  All questions were answered. The patient knows to call the clinic with any problems, questions or concerns. We can certainly see the patient much sooner if necessary.   Disclaimer: This note was dictated with voice recognition software. Similar sounding words can inadvertently be transcribed and may not be corrected upon review.

## 2023-06-10 NOTE — Patient Instructions (Signed)
 [facility] - A DEPT OF [facility]  Discharge Instructions: Thank you for choosing [facility] to provide your oncology and hematology care.   If you have a lab appointment with the Cancer Center, please go directly to the Cancer Center and check in at the registration area.   Wear comfortable clothing and clothing appropriate for easy access to any Portacath or PICC line.   We strive to give you quality time with your provider. You may need to reschedule your appointment if you arrive late (15 or more minutes).  Arriving late affects you and other patients whose appointments are after yours.  Also, if you miss three or more appointments without notifying the office, you may be dismissed from the clinic at the provider's discretion.      For prescription refill requests, have your pharmacy contact our office and allow 72 hours for refills to be completed.    Today you received the following chemotherapy and/or immunotherapy agents: Cyramza, Docetaxel      To help prevent nausea and vomiting after your treatment, we encourage you to take your nausea medication as directed.  BELOW ARE SYMPTOMS THAT SHOULD BE REPORTED IMMEDIATELY: *FEVER GREATER THAN 100.4 F (38 C) OR HIGHER *CHILLS OR SWEATING *NAUSEA AND VOMITING THAT IS NOT CONTROLLED WITH YOUR NAUSEA MEDICATION *UNUSUAL SHORTNESS OF BREATH *UNUSUAL BRUISING OR BLEEDING *URINARY PROBLEMS (pain or burning when urinating, or frequent urination) *BOWEL PROBLEMS (unusual diarrhea, constipation, pain near the anus) TENDERNESS IN MOUTH AND THROAT WITH OR WITHOUT PRESENCE OF ULCERS (sore throat, sores in mouth, or a toothache) UNUSUAL RASH, SWELLING OR PAIN  UNUSUAL VAGINAL DISCHARGE OR ITCHING   Items with * indicate a potential emergency and should be followed up as soon as possible or go to the Emergency Department if any problems should occur.  Please show the CHEMOTHERAPY ALERT CARD or  IMMUNOTHERAPY ALERT CARD at check-in to the Emergency Department and triage nurse.  Should you have questions after your visit or need to cancel or reschedule your appointment, please contact [facility] - A DEPT [facility]  Dept: [phone]  and follow the prompts.  Office hours are 8:00 a.m. to 4:30 p.m. Monday - Friday. Please note that voicemails left after 4:00 p.m. may not be returned until the following business day.  We are closed weekends and major holidays. You have access to a nurse at all times for urgent questions. Please call the main number to the clinic Dept: [phone] and follow the prompts.   For any non-urgent questions, you may also contact your provider using MyChart. We now offer e-Visits for anyone 34 and older to request care online for non-urgent symptoms. For details visit [email or web address].   Also download the MyChart app! Go to the app store, search "MyChart", open the app, select [facility], and log in with your MyChart username and password.

## 2023-06-12 ENCOUNTER — Telehealth: Payer: Self-pay

## 2023-06-12 ENCOUNTER — Inpatient Hospital Stay

## 2023-06-12 ENCOUNTER — Ambulatory Visit: Payer: Medicare Other

## 2023-06-12 NOTE — Telephone Encounter (Signed)
 Spoke with patient this morning and he reports that his schedule does not look correct.  Informed patient I would relay message to scheduler to fix.  Patient stated that he will get the new schedule next week when he is here.

## 2023-06-16 ENCOUNTER — Other Ambulatory Visit: Payer: Self-pay | Admitting: Internal Medicine

## 2023-06-16 NOTE — Telephone Encounter (Signed)
 Copied from CRM [id number]. Topic: Clinical - Medication Refill >> Jun 16, 2023 11:44 AM Elizebeth Brooking wrote: Most Recent Primary Care Visit:  Provider: Hillard Danker A  Department: [facility]  Visit Type: HOSPITAL FU  Date: 10/31/2022  Medication: HYDROcodone-acetaminophen (NORCO) 10-325 MG tablet   Has the patient contacted their pharmacy? Yes (Agent: If no, request that the patient contact the pharmacy for the refill. If patient does not wish to contact the pharmacy document the reason why and proceed with request.) (Agent: If yes, when and what did the pharmacy advise?)  Is this the correct pharmacy for this prescription? Yes If no, delete pharmacy and type the correct one.  This is the patient's preferred pharmacy:  [id number] [place], [place] - [address] AT [facility] [address] [facility] [address] [place] [address] Phone: [phone] Fax: [phone]   Has the prescription been filled recently? No  Is the patient out of the medication? Yes  Has the patient been seen for an appointment in the last year OR does the patient have an upcoming appointment? Yes  Can we respond through MyChart? Yes  Agent: Please be advised that Rx refills may take up to 3 business days. We ask that you follow-up with your pharmacy.

## 2023-06-17 ENCOUNTER — Inpatient Hospital Stay: Payer: Medicare Other

## 2023-06-17 DIAGNOSIS — Z79899 Other long term (current) drug therapy: Secondary | ICD-10-CM | POA: Diagnosis not present

## 2023-06-17 DIAGNOSIS — C3431 Malignant neoplasm of lower lobe, right bronchus or lung: Secondary | ICD-10-CM | POA: Diagnosis not present

## 2023-06-17 DIAGNOSIS — Z5112 Encounter for antineoplastic immunotherapy: Secondary | ICD-10-CM | POA: Diagnosis not present

## 2023-06-17 DIAGNOSIS — Z5111 Encounter for antineoplastic chemotherapy: Secondary | ICD-10-CM | POA: Diagnosis not present

## 2023-06-17 DIAGNOSIS — C3490 Malignant neoplasm of unspecified part of unspecified bronchus or lung: Secondary | ICD-10-CM

## 2023-06-17 LAB — CBC WITH DIFFERENTIAL (CANCER CENTER ONLY)
Abs Immature Granulocytes: 0.03 10*3/uL (ref 0.00–0.07)
Basophils Absolute: 0 10*3/uL (ref 0.0–0.1)
Basophils Relative: 1 %
Eosinophils Absolute: 0 10*3/uL (ref 0.0–0.5)
Eosinophils Relative: 1 %
HCT: 32 % — ABNORMAL LOW (ref 39.0–52.0)
Hemoglobin: 11 g/dL — ABNORMAL LOW (ref 13.0–17.0)
Immature Granulocytes: 1 %
Lymphocytes Relative: 37 %
Lymphs Abs: 0.9 10*3/uL (ref 0.7–4.0)
MCH: 31 pg (ref 26.0–34.0)
MCHC: 34.4 g/dL (ref 30.0–36.0)
MCV: 90.1 fL (ref 80.0–100.0)
Monocytes Absolute: 0.1 10*3/uL (ref 0.1–1.0)
Monocytes Relative: 4 %
Neutro Abs: 1.4 10*3/uL — ABNORMAL LOW (ref 1.7–7.7)
Neutrophils Relative %: 56 %
Platelet Count: 106 10*3/uL — ABNORMAL LOW (ref 150–400)
RBC: 3.55 MIL/uL — ABNORMAL LOW (ref 4.22–5.81)
RDW: 13.9 % (ref 11.5–15.5)
WBC Count: 2.5 10*3/uL — ABNORMAL LOW (ref 4.0–10.5)
nRBC: 0 % (ref 0.0–0.2)

## 2023-06-17 LAB — CMP (CANCER CENTER ONLY)
ALT: 20 U/L (ref 0–44)
AST: 16 U/L (ref 15–41)
Albumin: 3.6 g/dL (ref 3.5–5.0)
Alkaline Phosphatase: 42 U/L (ref 38–126)
Anion gap: 7 (ref 5–15)
BUN: 23 mg/dL (ref 8–23)
CO2: 29 mmol/L (ref 22–32)
Calcium: 8.3 mg/dL — ABNORMAL LOW (ref 8.9–10.3)
Chloride: 104 mmol/L (ref 98–111)
Creatinine: 0.93 mg/dL (ref 0.61–1.24)
GFR, Estimated: >60 mL/min (ref >60)
Glucose, Bld: 199 mg/dL — ABNORMAL HIGH (ref 70–99)
Potassium: 3.9 mmol/L (ref 3.5–5.1)
Sodium: 140 mmol/L (ref 135–145)
Total Bilirubin: 0.6 mg/dL (ref 0.0–1.2)
Total Protein: 6.2 g/dL — ABNORMAL LOW (ref 6.5–8.1)

## 2023-06-18 MED ORDER — HYDROCODONE-ACETAMINOPHEN 10-325 MG PO TABS: 1.0000 | ORAL_TABLET | Freq: Two times a day (BID) | ORAL | 0 refills | Status: DC | PRN

## 2023-06-21 ENCOUNTER — Other Ambulatory Visit: Payer: Self-pay | Admitting: Internal Medicine

## 2023-06-23 ENCOUNTER — Telehealth: Payer: Self-pay | Admitting: Medical Oncology

## 2023-06-23 ENCOUNTER — Encounter: Payer: Self-pay | Admitting: Medical Oncology

## 2023-06-23 NOTE — Telephone Encounter (Signed)
 Steroids dosing-I returned pt call.  He says he is feeling good because he is taking two different steroids.   He is taking decadron 4 mg /day and prednisone 5 mg hs and " I have not been sick since I started taking decadron 4 mg /day and the prednisone hs".    He also takes  decadron 2 tablets the day before , the day of and the day after treatment and then goes back to decadron 4 mg daily.  Requests Decadron refill -He wants the decadron prescription refill to say decadron 4 mg daily and 2 tabs day before , day of and day after and with refills.

## 2023-06-24 ENCOUNTER — Inpatient Hospital Stay: Payer: Medicare Other

## 2023-06-24 DIAGNOSIS — Z79899 Other long term (current) drug therapy: Secondary | ICD-10-CM | POA: Diagnosis not present

## 2023-06-24 DIAGNOSIS — C3431 Malignant neoplasm of lower lobe, right bronchus or lung: Secondary | ICD-10-CM | POA: Diagnosis not present

## 2023-06-24 DIAGNOSIS — C3490 Malignant neoplasm of unspecified part of unspecified bronchus or lung: Secondary | ICD-10-CM

## 2023-06-24 DIAGNOSIS — Z5111 Encounter for antineoplastic chemotherapy: Secondary | ICD-10-CM | POA: Diagnosis not present

## 2023-06-24 DIAGNOSIS — Z5112 Encounter for antineoplastic immunotherapy: Secondary | ICD-10-CM | POA: Diagnosis not present

## 2023-06-24 LAB — CMP (CANCER CENTER ONLY)
ALT: 24 U/L (ref 0–44)
AST: 15 U/L (ref 15–41)
Albumin: 3.5 g/dL (ref 3.5–5.0)
Alkaline Phosphatase: 44 U/L (ref 38–126)
Anion gap: 10 (ref 5–15)
BUN: 20 mg/dL (ref 8–23)
CO2: 24 mmol/L (ref 22–32)
Calcium: 8.5 mg/dL — ABNORMAL LOW (ref 8.9–10.3)
Chloride: 106 mmol/L (ref 98–111)
Creatinine: 0.9 mg/dL (ref 0.61–1.24)
GFR, Estimated: >60 mL/min (ref >60)
Glucose, Bld: 227 mg/dL — ABNORMAL HIGH (ref 70–99)
Potassium: 3.8 mmol/L (ref 3.5–5.1)
Sodium: 140 mmol/L (ref 135–145)
Total Bilirubin: 0.5 mg/dL (ref 0.0–1.2)
Total Protein: 5.9 g/dL — ABNORMAL LOW (ref 6.5–8.1)

## 2023-06-24 LAB — CBC WITH DIFFERENTIAL (CANCER CENTER ONLY)
Abs Immature Granulocytes: 0.18 10*3/uL — ABNORMAL HIGH (ref 0.00–0.07)
Basophils Absolute: 0.1 10*3/uL (ref 0.0–0.1)
Basophils Relative: 1 %
Eosinophils Absolute: 0 10*3/uL (ref 0.0–0.5)
Eosinophils Relative: 0 %
HCT: 33.3 % — ABNORMAL LOW (ref 39.0–52.0)
Hemoglobin: 11.2 g/dL — ABNORMAL LOW (ref 13.0–17.0)
Immature Granulocytes: 5 %
Lymphocytes Relative: 40 %
Lymphs Abs: 1.5 10*3/uL (ref 0.7–4.0)
MCH: 30.7 pg (ref 26.0–34.0)
MCHC: 33.6 g/dL (ref 30.0–36.0)
MCV: 91.2 fL (ref 80.0–100.0)
Monocytes Absolute: 1.1 10*3/uL — ABNORMAL HIGH (ref 0.1–1.0)
Monocytes Relative: 29 %
Neutro Abs: 0.9 10*3/uL — ABNORMAL LOW (ref 1.7–7.7)
Neutrophils Relative %: 25 %
Platelet Count: 128 10*3/uL — ABNORMAL LOW (ref 150–400)
RBC: 3.65 MIL/uL — ABNORMAL LOW (ref 4.22–5.81)
RDW: 15.6 % — ABNORMAL HIGH (ref 11.5–15.5)
WBC Count: 3.7 10*3/uL — ABNORMAL LOW (ref 4.0–10.5)
nRBC: 1.6 % — ABNORMAL HIGH (ref 0.0–0.2)

## 2023-06-29 ENCOUNTER — Ambulatory Visit (INDEPENDENT_AMBULATORY_CARE_PROVIDER_SITE_OTHER): Admitting: Internal Medicine

## 2023-06-29 ENCOUNTER — Encounter: Payer: Self-pay | Admitting: Internal Medicine

## 2023-06-29 VITALS — BP 120/70 | HR 89 | Ht 72.0 in | Wt 202.2 lb

## 2023-06-29 DIAGNOSIS — E2749 Other adrenocortical insufficiency: Secondary | ICD-10-CM | POA: Diagnosis not present

## 2023-06-29 DIAGNOSIS — Z7984 Long term (current) use of oral hypoglycemic drugs: Secondary | ICD-10-CM

## 2023-06-29 DIAGNOSIS — E1159 Type 2 diabetes mellitus with other circulatory complications: Secondary | ICD-10-CM

## 2023-06-29 DIAGNOSIS — E1165 Type 2 diabetes mellitus with hyperglycemia: Secondary | ICD-10-CM

## 2023-06-29 MED ORDER — PREDNISONE 5 MG PO TABS: 5.0000 mg | ORAL_TABLET | Freq: Every day | ORAL | 3 refills | Status: DC

## 2023-06-29 NOTE — Patient Instructions (Addendum)
 Please continue Prednisone  5 mg in am, but for 2-3 days after Dexamethasone  (take them both in am).  You absolutely need to take this medication every day and not skip doses.  Please double the dose if you have a fever, for the duration of the fever.  Also, if you have significant other stress like dehydration, diarrhea, or otherwise unexplained weakness.  If you cannot take anything by mouth (vomiting) or you have severe diarrhea so that you eliminate the Prednisone  pills in your stool, please make sure that you get the steroid in the vein instead - go to the nearest emergency department/urgent care or you may go to your PCPs office.  Please get a MedAlert bracelet or pendant indicating: "Adrenal insufficiency".   Restart checking blood sugars 1x a day.  Please come back for a follow-up appointment in  6 months.

## 2023-06-29 NOTE — Progress Notes (Addendum)
 Patient ID: Brandon Rubio., male   DOB: [birth year], 73 y.o.   MRN: [id number]  HPI  Brandon Rubio. is a 73 y.o.-year-old male, initially referred by his PCP, Dr. Nicolette Barrio, returning for management of adrenal insufficiency.  Last visit 9 months ago (virtual).  Interim history: Patient has a history of lung cancer, with history of immunotherapy, chemotherapy and then radiation treatment which was stopped due to intolerance.  He had immunotherapy and currently undergoing chemotherapy.   Unfortunately, this year he had another PET CT showing progression of the cancer. He had significant fatigue and weakness  >> much improved after lowering ChTx doses.  He lost weight after our last visit but then gained most of it back. He is now on higher ChTx doses again. He takes 8 mg Dexamethasone  x3 days around the time of ChTx.  He is not feeling well, again feeling weak and has shortness of breath. Back pain is improved on pain medicine (hydrocodone ).  Reviewed history: Patient has a history of lung cancer stage III diagnosed 11/2020.  He was started on ChTx and Immuno Tx.  After 2 ChTx, he got very sick >> admitted. He then tried ChTx (but platelets decreased to low) and RxTx >> very sick >> admitted.  Of note, he got dexamethasone  with his treatments. He completed 31/33 RxTx. This was resumed afterwards.  During both admissions, he was found to have dizziness and orthostatic  hypotension and had a low cortisol, which did not stimulate well with cosyntropin : Component     Latest Ref Rng & Units 05/11/2021 05/12/2021  Cortisol, Base     ug/dL  0.7  Cortisol, 30 Min     ug/dL  8.0  Cortisol, 60 Min     ug/dL  40.9  Cortisol, Plasma     ug/dL 1.1   W119 ACTH     7.2 - 63.3 pg/mL  <1.5 (L)   MRI (05/15/2021); no pituitary mass  An adrenal insufficiency diagnosis was made and he was started on hydrocortisone : - 15 mg in am (7-8 am) - 10 mg in pm (6-7 pm)  At our first visit, I advised him to  decrease the dose to: - 10 mg in am - 10 mg in pm  However, he was admitted again afterwards for the dizziness and weakness so we increased his hydrocortisone  back to: - 15 mg in am - 10 mg in pm   In 06/2021, he contacted me with abdominal pain from hydrocortisone  and also increased sweating.  I suggested prednisone : - 7.5 mg daily  09/2022, we decreased the dose of prednisone : - 5 mg daily  He recently contacted me about starting maintenance treatment with dexamethasone , which he got at higher doses with his chemotherapy.  He was also on Midodrine , now off.  No h/o po ketoconazole, phenytoin, rifampin, chronic fluconazole use. No h/o autoimmune diseases in pt or family mbs. No excess use of NSAIDs. No h/o generalized infections or HIV. No IVDA. No h/o head injury or severe HA.  At our first visit in 05/2021 he mentioned: - + weight loss - lost 20 lbs in last 1.5 mo - + fatigue - + nausea - no vomiting - + abdominal pain - + mm aches (legs and back) - no palpitations - + mild HAs - + dizziness - + presyncopal episodes (when taking a shower) - + SOB with minimal exertion  No hyponatremia:   Chemistry      Component Value Date/Time   NA 140 06/24/2023  1020   NA 137 10/24/2022 1540   K 3.8 06/24/2023 1020   CL 106 06/24/2023 1020   CO2 24 06/24/2023 1020   BUN 20 06/24/2023 1020   BUN 9 10/24/2022 1540   CREATININE 0.90 06/24/2023 1020      Component Value Date/Time   CALCIUM  8.5 (L) 06/24/2023 1020   ALKPHOS 44 06/24/2023 1020   AST 15 06/24/2023 1020   ALT 24 06/24/2023 1020   BILITOT 0.5 06/24/2023 1020     Pt. also has a history of DM2 with history of DKA.  However, this is more controlled lately: Lab Results  Component Value Date   HGBA1C 6.8 (A) 03/24/2022   HGBA1C 6.1 (H) 05/27/2021   HGBA1C 5.9 (H) 05/11/2021   He is on: - Metformin  500 >> 1000 mg 2x a day -  >> stopped due to good control -  >> stopped due to good control  CBGs - not  checking now, prev.: - am: 120-130, occas. 140   He has a history of hyperlipidemia for which he is on Zocor  40 mg daily: Lab Results  Component Value Date   CHOL 122 05/24/2021   HDL 35 (L) 05/24/2021   LDLCALC 55 05/24/2021   LDLDIRECT 62.0 10/02/2020   TRIG 162 (H) 05/24/2021   CHOLHDL 3.5 05/24/2021    Last eye exam:06/2022: No DR reportedly.  He was found to have aortic atherosclerosis on the CT scan from 03/10/2022. He also has a history of pancytopenia. He had COVID-19 in 02/2021.  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   Peripheral neuropathy    toes   Past Surgical History:  Procedure Laterality Date   BRONCHIAL BIOPSY  12/10/2020   Procedure: BRONCHIAL BIOPSIES;  Surgeon: Denson Flake, MD;  Location: [facility] ENDOSCOPY;  Service: Pulmonary;;   BRONCHIAL BRUSHINGS  12/10/2020   Procedure: BRONCHIAL BRUSHINGS;  Surgeon: Denson Flake, MD;  Location: [facility] ENDOSCOPY;  Service: Pulmonary;;   BRONCHIAL NEEDLE ASPIRATION BIOPSY  12/10/2020   Procedure: BRONCHIAL NEEDLE ASPIRATION BIOPSIES;  Surgeon: Denson Flake, MD;  Location: [facility] ENDOSCOPY;  Service: Pulmonary;;   COLONOSCOPY     COLONOSCOPY W/ POLYPECTOMY  03/10/2009   LARYNGOSCOPY  03/10/1997   VIDEO BRONCHOSCOPY WITH ENDOBRONCHIAL NAVIGATION N/A 12/10/2020   Procedure: ROBOTIC VIDEO BRONCHOSCOPY WITH ENDOBRONCHIAL NAVIGATION;  Surgeon: Denson Flake, MD;  Location: [facility] ENDOSCOPY;  Service: Pulmonary;  Laterality: N/A;   Social History   Socioeconomic History   Marital status: Divorced    Spouse name: Not on  file   Number of children: 0   Years of education: Not on file   Highest education level: Not on file  Occupational History   Occupation: Retired Personnel officer  Tobacco Use   Smoking status: Every Day    Current packs/day: 1.50    Average packs/day: 1.5 packs/day for 58.0 years (87.0 ttl pk-yrs)    Types: Cigarettes   Smokeless tobacco: Never   Tobacco comments:    2+ packs smoked daily ARJ 12/05/20  Vaping Use   Vaping status: Never Used  Substance and Sexual Activity   Alcohol use: No    Alcohol/week: 0.0 standard drinks of alcohol    Comment:  none since 04/2012   Drug use: Never   Sexual activity: Not on file  Other Topics Concern   Not on file  Social History Narrative   REG EXERCISE         Social Drivers of Health   Financial Resource Strain: Low Risk  (09/15/2022)   Overall Financial Resource Strain (CARDIA)    Difficulty of Paying Living Expenses: Not hard at all  Food Insecurity: No Food Insecurity (10/08/2022)   Hunger Vital Sign    Worried About Running Out of Food in the Last Year: Never true    Ran Out of Food in the Last Year: Never true  Transportation Needs: No Transportation Needs (10/08/2022)   PRAPARE - Administrator, Civil Service (Medical): No    Lack of Transportation (Non-Medical): No  Physical Activity: Inactive (09/15/2022)   Exercise Vital Sign    Days of Exercise per Week: 0 days    Minutes of Exercise per Session: 0 min  Stress: No Stress Concern Present (09/15/2022)   [employer] of Occupational Health - Occupational Stress Questionnaire    Feeling of Stress : Not at all  Social Connections: Socially Isolated (09/15/2022)   Social Connection and Isolation Panel [NHANES]    Frequency of Communication with Friends and Family: More than three times a week    Frequency of Social Gatherings with Friends and Family: Three times a week    Attends Religious Services: Never    Active Member of Clubs or Organizations: No    Attends Occupational hygienist Meetings: Never    Marital Status: Divorced  Catering manager Violence: Not At Risk (10/02/2022)   Humiliation, Afraid, Rape, and Kick questionnaire    Fear of Current or Ex-Partner: No    Emotionally Abused: No    Physically Abused: No    Sexually Abused: No   Current Outpatient Medications on File Prior to Visit  Medication Sig Dispense Refill   aspirin  EC 81 MG tablet Take 81 mg by mouth in the morning. Swallow whole.     bisacodyl (DULCOLAX) 5 MG EC tablet Take 5 mg by mouth daily as needed for moderate constipation.     dexamethasone  (DECADRON ) 4 MG tablet 2 tablet p.o. twice daily the day before, day of and day after chemotherapy every 3 weeks 60 tablet 0   folic acid  (FOLVITE ) 1 MG tablet TAKE 1 TABLET BY MOUTH EVERY DAY 30 tablet 2   HYDROcodone -acetaminophen  (NORCO) 10-325 MG tablet Take 1 tablet by mouth every 12 (twelve) hours as needed. 60 tablet 0   Magnesium  Gluconate 550 MG TABS Take 1 tablet (550 mg total) by mouth 2 (two) times daily. 60 tablet 3   metFORMIN  (GLUCOPHAGE ) 1000 MG tablet TAKE 1 TABLET(1000 MG) BY MOUTH TWICE DAILY WITH A MEAL 180 tablet 0   metoprolol  tartrate (LOPRESSOR ) 25 MG tablet Take 1 tablet (25 mg total) by mouth 2 (two) times daily. 180 tablet 2   Multiple Vitamins-Minerals (CENTRUM SILVER 50+MEN) TABS Take 1 tablet by mouth daily with breakfast.     omeprazole  (PRILOSEC) 20 MG capsule TAKE 1 CAPSULE BY MOUTH TWICE DAILY BEFORE A MEAL 180 capsule 1   ondansetron  (ZOFRAN ) 8 MG tablet Take 1 tablet (8 mg total) by mouth every 8 (eight) hours as needed for nausea or  vomiting. 30 tablet 2   predniSONE  (DELTASONE ) 5 MG tablet Take 1-1.5 tablets (5-7.5 mg total) by mouth daily with breakfast. (Patient taking differently: Take 7.5 mg by mouth daily with breakfast.) 135 tablet 3   prochlorperazine  (COMPAZINE ) 10 MG tablet TAKE 1 TABLET(10 MG) BY MOUTH EVERY 6 HOURS FOR UP TO 14 DAYS AS NEEDED FOR NAUSEA OR VOMITING 60 tablet 1   simvastatin   (ZOCOR ) 40 MG tablet TAKE 1 TABLET BY MOUTH EVERY EVENING AT 6 PM. (Patient taking differently: Take 40 mg by mouth in the morning.) 90 tablet 3   No current facility-administered medications on file prior to visit.   Allergies  Allergen Reactions   Penicillins Anaphylaxis, Swelling and Other (See Comments)    THE THROAT SWELLS!!!! Because of a history of documented adverse serious drug reaction;[name] Alert bracelet  is recommended   Albumin  (Human) Hives and Itching   Cyramza  [Ramucirumab ] Itching   Family History  Problem Relation Age of Onset   Lung cancer Mother        X 2   Colon polyps Father    Colon cancer Father 34   Coronary artery disease Father    Lung disease Father    Esophageal cancer Neg Hx    Stomach cancer Neg Hx    Rectal cancer Neg Hx    PE: BP 120/70   Pulse 89   Ht 6' (1.829 m)   Wt 202 lb 3.2 oz (91.7 kg)   SpO2 92%   BMI 27.42 kg/m  Wt Readings from Last 15 Encounters:  06/29/23 202 lb 3.2 oz (91.7 kg)  06/10/23 195 lb 3.2 oz (88.5 kg)  05/20/23 192 lb 8 oz (87.3 kg)  04/29/23 188 lb 8 oz (85.5 kg)  04/08/23 192 lb 8 oz (87.3 kg)  04/02/23 192 lb 3.2 oz (87.2 kg)  03/17/23 193 lb (87.5 kg)  01/26/23 184 lb 14.4 oz (83.9 kg)  01/05/23 184 lb 11.2 oz (83.8 kg)  12/15/22 182 lb 6.4 oz (82.7 kg)  11/24/22 182 lb 5 oz (82.7 kg)  11/03/22 184 lb (83.5 kg)  10/31/22 183 lb (83 kg)  10/24/22 183 lb 9.6 oz (83.3 kg)  10/13/22 184 lb (83.5 kg)   Constitutional: overweight, in NAD Eyes:  EOMI, no exophthalmos ENT: no neck masses, no cervical lymphadenopathy Cardiovascular: RRR, No MRG, + B leg swelling - pitting Respiratory: CTA B Musculoskeletal: no deformities Skin:no rashes Neurological: no tremor with outstretched hands Diabetic Foot Exam - Simple   Simple Foot Form Diabetic Foot exam was performed with the following findings: Yes 06/29/2023  1:25 PM  Visual Inspection No deformities, no ulcerations, no other skin breakdown bilaterally:  Yes Sensation Testing Intact to touch and monofilament testing bilaterally: Yes Pulse Check Posterior Tibialis and Dorsalis pulse intact bilaterally: Yes Comments + signif. B LE edema - pitting    ASSESSMENT: 1. Adrenal insufficiency  2. DM2, non-insulin -dependent, with complications - Ao atherosclerosis  PLAN:  1. Adrenal insufficiency (AI) -Most likely due to steroid use (dexamethasone ) during chemotherapy for his lung cancer - His cortisol level did not stimulate well with cosyntropin , consistent with a diagnosis of adrenal insufficiency.  His ACTH was low, indicating a central etiology.  He was started on hydrocortisone  in the hospital and we continued this. - he had significant dizziness, weakness and disequilibrium despite hydrocortisone  15 mg in a.m. and 10 mg in p.m.  We discussed about decreasing the dose but he could not tolerate the lower dose due to significant weakness.  He did had to go to the emergency room for this.  However, the symptom could have been related to his radiation treatments, also, as he presented with similar symptoms before.  However, he ended up going back to the previous dose of 15 mg in a.m. and 10 mg in p.m.  He developed abdominal pain and swelling so we ended up switching to prednisone  7.5 mg daily.  He started to feel better on this dose. -However, afterwards, he developed decreased appetite (without nausea and vomiting).  He lost a significant amount of weight, approximately 20 pounds initially, but then gained 14 back.  We did discuss at previous visits about trying to build up his strength by doing weightbearing exercises, as I suspected the degree of deconditioning.  At last visit, he started to feel much better, not as weak as before or after his chemotherapy doses were reduced.  He still had back pain and was on pain medications, which helped.  He was telling me at last visit that he was not able to exercise due to feeling poorly for approximately 10  days after his chemotherapy sessions scheduled every 3 weeks.  At that time, I recommended to try to gradually decrease the dose of his prednisone  to 5 mg weekly.  He agreed to try this. -He recently received 4 mg of the dexamethasone  for his chemotherapy treatment and he contacted the office to see if he can stay on dexamethasone  long-term instead of prednisone .  At today's visit we discussed that dexamethasone  is a corticosteroid with a longer duration of action and more side effects compared to prednisone , so it is not usually recommended for long-term adrenal insufficiency treatment.  Even if we decide to switch to dexamethasone , the corresponding dose of dexamethasone  is 0.75 mg daily, and not 2 mg as he tried to take.  After discussion about benefits and possible side effects, he agrees to remain on prednisone .  I did advise him to increase the dose to 2 tablets when he is coming off dexamethasone , for 2 to 3 days.  I advised him to take this in the morning.  He has occasionally been taking 2 tablets/day but he was splitting the dose.  I advised him not to do so as this may impair his sleep.  He does admit that he is not sleeping well at all.  I did advise him that after the 2 to 3 days on the higher dose, to decrease the dose back to 5 mg of prednisone , which is his maintenance dose. -I reiterated sick day rules: - You absolutely need to take this medication every day and not skip doses. - Please double the dose if you have a fever, for the duration of the fever. - If you cannot take anything by mouth (vomiting) or you have severe diarrhea so that you eliminate the prednisone  pills in your stool, please make sure that you get the steroid in the vein instead - go to the nearest emergency department/urgent care or you may go to your PCPs office  - Please try to get a MedAlert bracelet or pendant indicating: "Adrenal insufficiency".  -I previously sent hydrocortisone  solution to his pharmacy for  situations when he could not take p.o.  Unfortunately, hydrocortisone  and dexamethasone  were not available and we discussed about going to the nearest emergency room or an urgent care to get steroids parenterally if needed.  At previous visits, I offered to send in IM solution of hydrocortisone  to his pharmacy but he preferred to  go to urgent care if he could not take p.o. steroids. -I recommended to get a med alert bracelet or pendant he has a wallet card. -I plan to see him back in 6 months  2. DM - he mentions that he relaxed his diet since last visit and he is eating many sweets. - Latest HbA1c is from more than a year ago: Lab Results  Component Value Date   HGBA1C 6.8 (A) 03/24/2022  - He was previously on insulin  and SGLT2 inhibitor but these were stopped due to good control - he continues on metformin  1000 mg twice a day, tolerated well - will recheck his HbA1c today - a foot exam today was normal except for the significant swelling - I advised him to get the new eye exam  Orders Placed This Encounter  Procedures   Lipid Panel w/reflex Direct LDL   Microalbumin / creatinine urine ratio   Hemoglobin A1c   Component     Latest Ref Rng 06/29/2023  Hemoglobin A1C     <5.7 % 9.2 (H)   Cholesterol     <200 mg/dL 161   Triglycerides     <150 mg/dL 096 (H)   HDL Cholesterol     > OR = 40 mg/dL 62   Total CHOL/HDL Ratio     <5.0 (calc) 3.1   Mean Plasma Glucose     mg/dL 045   LDL Cholesterol (Calc)     mg/dL (calc) 82   Non-HDL Cholesterol (Calc)     <130 mg/dL (calc) 409 (H)   eAG (mmol/L)     mmol/L 12.0   A1c is much higher, increased from 6.8% to 9.2%.  He does mention that he relaxed his diet since last visit.  Steroids can also contribute.  I would suggest to start back on the low-dose of Lantus , 10 units at bedtime and increase as needed until sugars in the morning decrease under 130.  I will discuss with him.  He absolutely needs to check blood sugars while on  insulin . Cholesterol levels are also worse possibly due to the uncontrolled diabetes.  Component     Latest Ref Rng 06/30/2023  Microalb, Ur     mg/dL 4.4   MICROALB/CREAT RATIO     <30 mg/g creat 46 (H)   Mean Plasma Glucose     mg/dL   Creatinine, Urine     20 - 320 mg/dL 95   Urinary proteins are elevated, possibly aligning with his uncontrolled diabetes.  Will recheck at next visit when diabetes control is hopefully better.  Addendum: Patient called 07/02/2023 not remembering our conversation about continuing with prednisone .  He insisted that he would like to use dexamethasone  at the lower dose, 0.75 mg daily, rather than prednisone .  I called this into his pharmacy.  Emilie Harden, MD PhD [facility] Endocrinology

## 2023-06-30 ENCOUNTER — Other Ambulatory Visit

## 2023-06-30 ENCOUNTER — Encounter: Payer: Self-pay | Admitting: Internal Medicine

## 2023-06-30 ENCOUNTER — Other Ambulatory Visit: Payer: Self-pay

## 2023-06-30 DIAGNOSIS — E1159 Type 2 diabetes mellitus with other circulatory complications: Secondary | ICD-10-CM | POA: Diagnosis not present

## 2023-06-30 DIAGNOSIS — E1165 Type 2 diabetes mellitus with hyperglycemia: Secondary | ICD-10-CM | POA: Diagnosis not present

## 2023-06-30 LAB — LIPID PANEL W/REFLEX DIRECT LDL
Cholesterol: 193 mg/dL (ref <200)
HDL: 62 mg/dL (ref >=40)
LDL Cholesterol (Calc): 82 mg/dL
Non-HDL Cholesterol (Calc): 131 mg/dL — ABNORMAL HIGH (ref <130)
Total CHOL/HDL Ratio: 3.1 (calc) (ref <5.0)
Triglycerides: 368 mg/dL — ABNORMAL HIGH (ref <150)

## 2023-06-30 LAB — HEMOGLOBIN A1C
Hgb A1c MFr Bld: 9.2 % — ABNORMAL HIGH (ref <5.7)
Mean Plasma Glucose: 217 mg/dL
eAG (mmol/L): 12 mmol/L

## 2023-07-01 ENCOUNTER — Inpatient Hospital Stay (HOSPITAL_BASED_OUTPATIENT_CLINIC_OR_DEPARTMENT_OTHER): Payer: Medicare Other | Admitting: Internal Medicine

## 2023-07-01 ENCOUNTER — Inpatient Hospital Stay: Payer: Medicare Other

## 2023-07-01 VITALS — BP 123/99 | HR 86 | Temp 97.4°F | Resp 17 | Ht 72.0 in | Wt 202.1 lb

## 2023-07-01 DIAGNOSIS — Z5111 Encounter for antineoplastic chemotherapy: Secondary | ICD-10-CM | POA: Diagnosis not present

## 2023-07-01 DIAGNOSIS — C3491 Malignant neoplasm of unspecified part of right bronchus or lung: Secondary | ICD-10-CM

## 2023-07-01 DIAGNOSIS — C3431 Malignant neoplasm of lower lobe, right bronchus or lung: Secondary | ICD-10-CM | POA: Diagnosis not present

## 2023-07-01 DIAGNOSIS — Z5112 Encounter for antineoplastic immunotherapy: Secondary | ICD-10-CM | POA: Diagnosis not present

## 2023-07-01 DIAGNOSIS — Z79899 Other long term (current) drug therapy: Secondary | ICD-10-CM | POA: Diagnosis not present

## 2023-07-01 LAB — CBC WITH DIFFERENTIAL (CANCER CENTER ONLY)
Abs Immature Granulocytes: 0.08 10*3/uL — ABNORMAL HIGH (ref 0.00–0.07)
Basophils Absolute: 0 10*3/uL (ref 0.0–0.1)
Basophils Relative: 0 %
Eosinophils Absolute: 0 10*3/uL (ref 0.0–0.5)
Eosinophils Relative: 0 %
HCT: 36.4 % — ABNORMAL LOW (ref 39.0–52.0)
Hemoglobin: 12.1 g/dL — ABNORMAL LOW (ref 13.0–17.0)
Immature Granulocytes: 1 %
Lymphocytes Relative: 7 %
Lymphs Abs: 0.8 10*3/uL (ref 0.7–4.0)
MCH: 30.9 pg (ref 26.0–34.0)
MCHC: 33.2 g/dL (ref 30.0–36.0)
MCV: 92.9 fL (ref 80.0–100.0)
Monocytes Absolute: 0.3 10*3/uL (ref 0.1–1.0)
Monocytes Relative: 3 %
Neutro Abs: 10.5 10*3/uL — ABNORMAL HIGH (ref 1.7–7.7)
Neutrophils Relative %: 89 %
Platelet Count: 125 10*3/uL — ABNORMAL LOW (ref 150–400)
RBC: 3.92 MIL/uL — ABNORMAL LOW (ref 4.22–5.81)
RDW: 16.2 % — ABNORMAL HIGH (ref 11.5–15.5)
WBC Count: 11.7 10*3/uL — ABNORMAL HIGH (ref 4.0–10.5)
nRBC: 0 % (ref 0.0–0.2)

## 2023-07-01 LAB — CMP (CANCER CENTER ONLY)
ALT: 30 U/L (ref 0–44)
AST: 19 U/L (ref 15–41)
Albumin: 3.7 g/dL (ref 3.5–5.0)
Alkaline Phosphatase: 57 U/L (ref 38–126)
Anion gap: 11 (ref 5–15)
BUN: 19 mg/dL (ref 8–23)
CO2: 21 mmol/L — ABNORMAL LOW (ref 22–32)
Calcium: 8.3 mg/dL — ABNORMAL LOW (ref 8.9–10.3)
Chloride: 105 mmol/L (ref 98–111)
Creatinine: 1 mg/dL (ref 0.61–1.24)
GFR, Estimated: >60 mL/min (ref >60)
Glucose, Bld: 438 mg/dL — ABNORMAL HIGH (ref 70–99)
Potassium: 4.5 mmol/L (ref 3.5–5.1)
Sodium: 137 mmol/L (ref 135–145)
Total Bilirubin: 0.5 mg/dL (ref 0.0–1.2)
Total Protein: 6.3 g/dL — ABNORMAL LOW (ref 6.5–8.1)

## 2023-07-01 LAB — MICROALBUMIN / CREATININE URINE RATIO
Creatinine, Urine: 95 mg/dL (ref 20–320)
Microalb Creat Ratio: 46 mg/g{creat} — ABNORMAL HIGH (ref <30)
Microalb, Ur: 4.4 mg/dL

## 2023-07-01 MED ORDER — SODIUM CHLORIDE 0.9 % IV SOLN
65.0000 mg/m2 | Freq: Once | INTRAVENOUS | Status: AC
  Administered 2023-07-01: 137 mg via INTRAVENOUS
  Filled 2023-07-01: qty 13.7

## 2023-07-01 MED ORDER — SODIUM CHLORIDE 0.9 % IV SOLN
10.0000 mg/kg | Freq: Once | INTRAVENOUS | Status: AC
  Administered 2023-07-01: 900 mg via INTRAVENOUS
  Filled 2023-07-01: qty 40

## 2023-07-01 MED ORDER — ACETAMINOPHEN 325 MG PO TABS
650.0000 mg | ORAL_TABLET | Freq: Once | ORAL | Status: AC
  Administered 2023-07-01: 650 mg via ORAL
  Filled 2023-07-01: qty 2

## 2023-07-01 MED ORDER — SODIUM CHLORIDE 0.9 % IV SOLN: INTRAVENOUS | Status: DC

## 2023-07-01 MED ORDER — DEXAMETHASONE 4 MG PO TABS: ORAL_TABLET | ORAL | 0 refills | Status: DC

## 2023-07-01 MED ORDER — DIPHENHYDRAMINE HCL 50 MG/ML IJ SOLN
50.0000 mg | Freq: Once | INTRAMUSCULAR | Status: AC
  Administered 2023-07-01: 50 mg via INTRAVENOUS
  Filled 2023-07-01: qty 1

## 2023-07-01 MED ORDER — FAMOTIDINE IN NACL 20-0.9 MG/50ML-% IV SOLN
20.0000 mg | Freq: Once | INTRAVENOUS | Status: AC
  Administered 2023-07-01: 20 mg via INTRAVENOUS
  Filled 2023-07-01: qty 50

## 2023-07-01 MED ORDER — DEXAMETHASONE SODIUM PHOSPHATE 10 MG/ML IJ SOLN
10.0000 mg | Freq: Once | INTRAMUSCULAR | Status: AC
  Administered 2023-07-01: 10 mg via INTRAVENOUS
  Filled 2023-07-01: qty 1

## 2023-07-01 NOTE — Progress Notes (Signed)
 [facility] [phone]   [phone]  OFFICE PROGRESS NOTE  Adelia Homestead, MD [address] [place] [address]  DIAGNOSIS: Metastatic non-small cell lung cancer initially diagnosed as stage IIIA (T4, N0, M0) non-small cell lung cancer, favoring adenocarcinoma presented with large right lower lobe lung mass with no mediastinal lymphadenopathy or extrathoracic metastasis diagnosed in October 2022. Had disease progression with enlarging mass and new right hilar and mediastinal lymph nodes in December 2022.      PRIOR THERAPY:  1) Neoadjuvant treatment with carboplatin  for AUC of 5, Alimta 500 Mg/M2 and nivolumab  360 Mg IV every 3 weeks.  First dose January 23, 2021.  Status post 3 cycles. 2) Concurrent chemoradiation with carboplatin  for an AUC of 2 and paclitaxel  45 mg per metered squared.  First dose expected on 04/01/2021.  Status post 4 cycles 3) Consolidation treatment with immunotherapy with Imfinzi  1500 Mg IV every 4 weeks.  First dose on Jul 08, 2021.  Status post 9 cycles.  4) Palliative systemic chemotherapy with carboplatin  for an AUC of 5, Alimta 500 mg/m, Keytruda  200 mg IV every 3 weeks. First dose on 05/13/2022.  Status post 12 cycles.  Starting him cycle #3 his dose of carboplatin  will be reduced to AUC of 4 and Alimta 400 Mg/M2 secondary to intolerance.  Last dose was given January 26, 2023 discontinued secondary to disease progression.    CURRENT THERAPY: Second line systemic chemotherapy with docetaxel  65 Mg/M2 and ramucirumab  10 Mg/KG every 3 weeks with Neulasta  support.  First dose April 08, 2023.  Status post 4 cycles.  INTERVAL HISTORY: Brandon Rubio. 73 y.o. male returns to the clinic today for follow-up visit. Discussed the use of AI scribe software for clinical note transcription with the patient, who gave verbal consent to proceed.  History of Present Illness   Brandon Rubio is a 73 year old male with  metastatic non-small cell lung cancer who presents for evaluation before starting cycle number five of chemotherapy.  He is currently on second-line treatment with systemic chemotherapy, receiving reduced dose docetaxel  65 mg/m and ramucirumab  10 mg/kg every three weeks. He has completed four cycles of this regimen.  This cycle of chemotherapy went much better than previous cycles, which he attributes to the discontinuation of the Neulasta  injection, previously causing significant issues. He has been using dexamethasone  as a premedication, taking 8 mg twice a day the day before, the day of, and the day after treatment. He extended the use of dexamethasone  for about five to eight days, taking one pill in the morning and evening, which he feels helped him avoid severe sickness and hospitalization.  He experiences dizziness, particularly when changing positions, and once felt like he was going to faint while driving.  His diabetes management has been taken over by an endocrinologist, who recently conducted an A1c test.       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  MEDICATIONS:  Current Outpatient Medications  Medication Sig Dispense Refill   aspirin  EC 81 MG tablet Take 81 mg by mouth in the morning. Swallow whole.     bisacodyl (DULCOLAX) 5 MG EC tablet Take 5 mg by mouth daily as needed for moderate constipation.     dexamethasone  (DECADRON ) 4 MG tablet 2 tablet p.o. twice daily  the day before, day of and day after chemotherapy every 3 weeks 60 tablet 0   folic acid  (FOLVITE ) 1 MG tablet TAKE 1 TABLET BY MOUTH EVERY DAY 30 tablet 2   HYDROcodone -acetaminophen  (NORCO) 10-325 MG tablet Take 1 tablet by mouth every 12 (twelve) hours as needed. 60 tablet 0   Magnesium  Gluconate 550 MG TABS Take 1 tablet (550 mg total) by mouth 2 (two) times daily. 60 tablet 3   metFORMIN  (GLUCOPHAGE ) 1000 MG tablet TAKE 1 TABLET(1000 MG) BY MOUTH TWICE DAILY WITH A MEAL 180 tablet 0   metoprolol  tartrate (LOPRESSOR ) 25 MG tablet Take 1 tablet (25 mg total) by mouth 2 (two) times daily. 180 tablet 2   Multiple Vitamins-Minerals (CENTRUM SILVER 50+MEN) TABS Take 1 tablet by mouth daily with breakfast.     omeprazole  (PRILOSEC) 20 MG capsule TAKE 1 CAPSULE BY MOUTH TWICE DAILY BEFORE A MEAL 180 capsule 1   ondansetron  (ZOFRAN ) 8 MG tablet Take 1 tablet (8 mg total) by mouth every 8 (eight) hours as needed for nausea or vomiting. 30 tablet 2   predniSONE  (DELTASONE ) 5 MG tablet Take 1 tablet (5 mg total) by mouth daily with breakfast. 200 tablet 3   prochlorperazine  (COMPAZINE ) 10 MG tablet TAKE 1 TABLET(10 MG) BY MOUTH EVERY 6 HOURS FOR UP TO 14 DAYS AS NEEDED FOR NAUSEA OR VOMITING 60 tablet 1   simvastatin  (ZOCOR ) 40 MG tablet TAKE 1 TABLET BY MOUTH EVERY EVENING AT 6 PM. (Patient taking differently: Take 40 mg by mouth in the morning.) 90 tablet 3   No current facility-administered medications for this visit.    SURGICAL HISTORY:  Past Surgical History:  Procedure Laterality Date   BRONCHIAL BIOPSY  12/10/2020   Procedure: BRONCHIAL BIOPSIES;  Surgeon: Denson Flake, MD;  Location: [facility] ENDOSCOPY;  Service: Pulmonary;;   BRONCHIAL BRUSHINGS  12/10/2020   Procedure: BRONCHIAL BRUSHINGS;  Surgeon: Denson Flake, MD;  Location: [facility] ENDOSCOPY;  Service: Pulmonary;;   BRONCHIAL NEEDLE ASPIRATION BIOPSY  12/10/2020   Procedure: BRONCHIAL NEEDLE ASPIRATION BIOPSIES;  Surgeon: Denson Flake,  MD;  Location: [facility] ENDOSCOPY;  Service: Pulmonary;;   COLONOSCOPY     COLONOSCOPY W/ POLYPECTOMY  03/10/2009   LARYNGOSCOPY  03/10/1997   VIDEO BRONCHOSCOPY WITH ENDOBRONCHIAL NAVIGATION N/A 12/10/2020   Procedure: ROBOTIC VIDEO BRONCHOSCOPY WITH ENDOBRONCHIAL NAVIGATION;  Surgeon: Denson Flake, MD;  Location: [facility] ENDOSCOPY;  Service: Pulmonary;  Laterality: N/A;    REVIEW OF SYSTEMS:  Constitutional: positive for fatigue Eyes: negative Ears, nose, mouth, throat, and face: negative Respiratory: positive for cough Cardiovascular: negative Gastrointestinal: negative Genitourinary:negative Integument/breast: negative Hematologic/lymphatic: negative Musculoskeletal:negative Neurological: negative Behavioral/Psych: negative Endocrine: negative Allergic/Immunologic: negative   PHYSICAL EXAMINATION: General appearance: alert, cooperative, fatigued, and no distress Head: Normocephalic, without obvious abnormality, atraumatic Neck: no adenopathy, no JVD, supple, symmetrical, trachea midline, and thyroid  not enlarged, symmetric, no tenderness/mass/nodules Lymph nodes: Cervical, supraclavicular, and axillary nodes normal. Resp: clear to auscultation bilaterally Back: symmetric, no curvature. ROM normal. No CVA tenderness. Cardio: regular rate and rhythm, S1, S2 normal, no murmur, click,  rub or gallop GI: soft, non-tender; bowel sounds normal; no masses,  no organomegaly Extremities: extremities normal, atraumatic, no cyanosis or edema Neurologic: Alert and oriented X 3, normal strength and tone. Normal symmetric reflexes. Normal coordination and gait  ECOG PERFORMANCE STATUS: 1 - Symptomatic but completely ambulatory  Blood pressure (!) 123/99, pulse 86, temperature (!) 97.4 F (36.3 C), temperature source Temporal, resp. rate 17, height 6' (1.829 m), weight 202 lb 1.6 oz (91.7 kg), SpO2 97%.  LABORATORY DATA: Lab Results  Component Value Date   WBC 11.7 (H) 07/01/2023   HGB 12.1 (L)  07/01/2023   HCT 36.4 (L) 07/01/2023   MCV 92.9 07/01/2023   PLT 125 (L) 07/01/2023      Chemistry      Component Value Date/Time   NA 137 07/01/2023 0901   NA 137 10/24/2022 1540   K 4.5 07/01/2023 0901   CL 105 07/01/2023 0901   CO2 21 (L) 07/01/2023 0901   BUN 19 07/01/2023 0901   BUN 9 10/24/2022 1540   CREATININE 1.00 07/01/2023 0901      Component Value Date/Time   CALCIUM  8.3 (L) 07/01/2023 0901   ALKPHOS 57 07/01/2023 0901   AST 19 07/01/2023 0901   ALT 30 07/01/2023 0901   BILITOT 0.5 07/01/2023 0901       RADIOGRAPHIC STUDIES: CT CHEST ABDOMEN PELVIS W CONTRAST Result Date: 06/09/2023 CLINICAL DATA:  Non-small-cell lung cancer.  * Tracking Code: BO * EXAM: CT CHEST, ABDOMEN, AND PELVIS WITH CONTRAST TECHNIQUE: Multidetector CT imaging of the chest, abdomen and pelvis was performed following the standard protocol during bolus administration of intravenous contrast. RADIATION DOSE REDUCTION: This exam was performed according to the departmental dose-optimization program which includes automated exposure control, adjustment of the mA and/or kV according to patient size and/or use of iterative reconstruction technique. CONTRAST:  OMNIPAQUE  IOHEXOL  300 MG/ML  SOLN COMPARISON:  PET-CT scan 03/20/2023.  Chest CT 03/09/2023 and older. FINDINGS: CT CHEST FINDINGS Cardiovascular: The thoracic aorta has a normal course and caliber with partially calcified atherosclerotic plaque. Coronary artery calcifications are seen. Trace pericardial fluid. Heart is nonenlarged. Mediastinum/Nodes: Preserved thyroid  gland. Slightly patulous thoracic esophagus. No discrete abnormal lymph node enlargement identified in the axillary regions, hilum or mediastinum. Lungs/Pleura: Centrilobular and paraseptal emphysematous changes are seen. There also scattered areas of interstitial septal thickening, scarring and fibrotic changes. In the right lower lobe once again is an area of consolidative opacity  with some high-density material. Associated volume loss and adjacent pleural thickening and tiny effusion. The extent and distribution of this area is similar to previous there is some adjacent nodular ground-glass just medial to this area on series 4, image 102 which is also stable compared to older examinations. Similar foci posteriorly in the right upper lobe such as series 4, image 91. More bandlike area of opacity seen in the inferior aspect right upper lobe near the margin of the minor fissure. No new dominant right-sided lung lesion. Left lung also has scattered areas of nodular ground-glass. Largest is seen in the left lower lobe and somewhat geographic in appearance. This also is similar to previous examination. On PET-CT this was measured at 4.0 x 3.2 cm. On the older standard CT this measured 7.4 x 3.6 cm. Today area measures 6.4 x 3.7 cm on series 4, image 84. Dominant area posteriorly in the lingula which on the remote CT scan from December 2024 head measured 3.6 x 1.6 cm and on today's examination when measured in  a similar fashion 3.4 by 1.7 cm series 4, image 72. Other areas are also similar in distribution. No left-sided pleural effusion or pneumothorax. Musculoskeletal: Scattered degenerative changes seen along the spine. CT ABDOMEN PELVIS FINDINGS Hepatobiliary: Fatty liver infiltration. No space-occupying liver lesion. Gallbladder is present. Patent portal vein. Pancreas: Moderate atrophy of the pancreas. No obvious mass. There is a tiny cystic area in the uncinate process measuring 7 mm. Unchanged from November 2024. Simple attention on follow-up for the patient's thoracic neoplasm. Spleen: Normal in size without focal abnormality. Adrenals/Urinary Tract: Adrenal glands are preserved. Moderate atrophy of the kidneys. No collecting system dilatation or perinephric fluid. Preserved contour to the urinary bladder. Left-sided renal cyst is seen measuring 4 cm in diameter with Hounsfield unit of 4.  Bosniak 1 lesion. Unchanged. Stomach/Bowel: On this non oral contrast exam large bowel has a normal course and caliber with diffuse colonic stool. Normal retrocecal appendix in the right hemipelvis. Once again there is significant fold thickening along the stomach. Please correlate with symptoms of gastritis or other process. Small bowel is nondilated. Vascular/Lymphatic: Diffuse vascular calcifications. Normal caliber aorta and IVC. 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 This is not seen with changed from previous. There is some mild mural plaque and thrombus as well. This is just above the right common iliac bifurcation. No specific abnormal lymph node enlargement identified in the abdomen and pelvis. Reproductive: Mildly prominent prostate. Other: No free air or free fluid. Musculoskeletal: Degenerative changes seen of the spine and pelvis. IMPRESSION: Overall similar appearance to the previous examination. Confluent up consolidative opacity along the right lower lobe with volume loss, calcification and adjacent fluid with pleural thickening is again seen and similar. The bilateral ground-glass nodular areas in both lungs are again noted and have a similar distribution to previous examination. Going back to the prior standard CT scan of December 2024, some of these areas appear smaller. Others are similar. No developing new mass lesion, fluid collection or lymph node enlargement. Fatty liver infiltration. Persistent gastric fold thickening. Please correlate with symptoms. 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 Electronically Signed   By: Adrianna Horde M.D.   On: 06/09/2023 16:39     ASSESSMENT AND PLAN: This is a very pleasant 73 years old white male with history of a stage IIIA (T4, N1, M0) non-small cell lung cancer, adenocarcinoma presented with right lower lobe lung mass with a right hilar and no mediastinal  lymphadenopathy and no evidence of extrathoracic disease.  The patient was diagnosed in October 2022 status post 3 cycles of neoadjuvant systemic chemotherapy with carboplatin , Alimta and nivolumab  but unfortunately has no evidence for significant improvement of his disease and actually has some evidence for progression. The patient underwent a course of concurrent chemoradiation weekly carboplatin  for AUC of 2 and paclitaxel  45 Mg/M2.  Status post 4 cycles. The patient had a rough time with the course of concurrent chemoradiation and he was hospitalized several times during this course. Imaging studies after the course of concurrent chemoradiation showed no concerning findings for disease progression. The patient underwent treatment with immunotherapy with Imfinzi  1500 Mg IV every 4 weeks status post 9 cycles.  This treatment was discontinued secondary to disease progression. The patient is currently on systemic chemotherapy with carboplatin , Alimta and Keytruda  status post 12 cycles.  Starting from cycle #5 he has been on treatment with maintenance Alimta and Keytruda  every 3 weeks.  The patient has been tolerating this treatment fairly well.  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  Repeat CT scan of the chest showed the diffuse opacity in the lungs bilaterally more on the left suspicious for disease progression rather than immunotherapy mediated pneumonitis. The patient had a PET scan performed recently and it showed multifocal adenocarcinoma progressive from comparison examination. He is currently on systemic chemotherapy with reduced dose docetaxel  65 Mg/M2 and ramucirumab  10 Mg/KG every 3 weeks status post 3 cycles. The Neulasta  injection was discontinued secondary to intolerance.    Metastatic non-small cell lung cancer Metastatic non-small cell lung cancer, adenocarcinoma  subtype, on second-line systemic chemotherapy with reduced dose docetaxel  (65 mg/m2) and ramucirumab  (10 mg/kg) every three weeks. He has completed four cycles and reports improved tolerance due to discontinuation of Neulasta  injections. He uses dexamethasone  as premedication and has not experienced severe sickness or required hospitalization. Occasional dizziness may be related to chemotherapy or other conditions. - Administer cycle five of chemotherapy with docetaxel  and ramucirumab . - Prescribe dexamethasone  for premedication and provide a refill. - Monitor for dizziness and advise caution when changing positions. - Plan for a scan after cycle six to assess treatment response.  Type 2 diabetes mellitus Type 2 diabetes mellitus managed by Dr. Mccoy Spell, endocrinologist. Recent A1c test conducted as part of diabetes management. Transitioning diabetes care to Dr. Mccoy Spell from a previous provider. - Continue diabetes management under Dr. Mccoy Spell, endocrinologist.   The patient was advised to call immediately if he has any concerning symptoms in the interval. The patient voices understanding of current disease status and treatment options and is in agreement with the current care plan.  All questions were answered. The patient knows to call the clinic with any problems, questions or concerns. We can certainly see the patient much sooner if necessary.   Disclaimer: This note was dictated with voice recognition software. Similar sounding words can inadvertently be transcribed and may not be corrected upon review.

## 2023-07-01 NOTE — Patient Instructions (Signed)
 [facility] - A DEPT OF [facility]  Discharge Instructions: Thank you for choosing [facility] to provide your oncology and hematology care.   If you have a lab appointment with the Cancer Center, please go directly to the Cancer Center and check in at the registration area.   Wear comfortable clothing and clothing appropriate for easy access to any Portacath or PICC line.   We strive to give you quality time with your provider. You may need to reschedule your appointment if you arrive late (15 or more minutes).  Arriving late affects you and other patients whose appointments are after yours.  Also, if you miss three or more appointments without notifying the office, you may be dismissed from the clinic at the provider's discretion.      For prescription refill requests, have your pharmacy contact our office and allow 72 hours for refills to be completed.    Today you received the following chemotherapy and/or immunotherapy agents :  ramucirumab  & Docetaxel       To help prevent nausea and vomiting after your treatment, we encourage you to take your nausea medication as directed.  BELOW ARE SYMPTOMS THAT SHOULD BE REPORTED IMMEDIATELY: *FEVER GREATER THAN 100.4 F (38 C) OR HIGHER *CHILLS OR SWEATING *NAUSEA AND VOMITING THAT IS NOT CONTROLLED WITH YOUR NAUSEA MEDICATION *UNUSUAL SHORTNESS OF BREATH *UNUSUAL BRUISING OR BLEEDING *URINARY PROBLEMS (pain or burning when urinating, or frequent urination) *BOWEL PROBLEMS (unusual diarrhea, constipation, pain near the anus) TENDERNESS IN MOUTH AND THROAT WITH OR WITHOUT PRESENCE OF ULCERS (sore throat, sores in mouth, or a toothache) UNUSUAL RASH, SWELLING OR PAIN  UNUSUAL VAGINAL DISCHARGE OR ITCHING   Items with * indicate a potential emergency and should be followed up as soon as possible or go to the Emergency Department if any problems should occur.  Please show the CHEMOTHERAPY ALERT CARD  or IMMUNOTHERAPY ALERT CARD at check-in to the Emergency Department and triage nurse.  Should you have questions after your visit or need to cancel or reschedule your appointment, please contact [facility] - A DEPT OF [facility]  Dept: [phone]  and follow the prompts.  Office hours are 8:00 a.m. to 4:30 p.m. Monday - Friday. Please note that voicemails left after 4:00 p.m. may not be returned until the following business day.  We are closed weekends and major holidays. You have access to a nurse at all times for urgent questions. Please call the main number to the clinic Dept: [phone] and follow the prompts.   For any non-urgent questions, you may also contact your provider using MyChart. We now offer e-Visits for anyone 54 and older to request care online for non-urgent symptoms. For details visit [email or web address].   Also download the MyChart app! Go to the app store, search "MyChart", open the app, select [place], and log in with your MyChart username and password.

## 2023-07-02 MED ORDER — DEXAMETHASONE 0.75 MG PO TABS: 0.7500 mg | ORAL_TABLET | Freq: Every day | ORAL | 1 refills | Status: DC

## 2023-07-02 NOTE — Addendum Note (Signed)
 Addended by: Emilie Harden on: 07/02/2023 12:04 PM   Modules accepted: Orders

## 2023-07-03 ENCOUNTER — Ambulatory Visit

## 2023-07-03 ENCOUNTER — Ambulatory Visit: Payer: Medicare Other

## 2023-07-05 ENCOUNTER — Encounter (HOSPITAL_COMMUNITY): Payer: Self-pay

## 2023-07-05 ENCOUNTER — Emergency Department (HOSPITAL_COMMUNITY)

## 2023-07-05 ENCOUNTER — Inpatient Hospital Stay (HOSPITAL_COMMUNITY)
Admission: EM | Admit: 2023-07-05 | Discharge: 2023-07-12 | DRG: 871 | Disposition: A | Discharge status: Home Health | Attending: Internal Medicine | Admitting: Internal Medicine

## 2023-07-05 ENCOUNTER — Other Ambulatory Visit: Payer: Self-pay

## 2023-07-05 DIAGNOSIS — K219 Gastro-esophageal reflux disease without esophagitis: Secondary | ICD-10-CM | POA: Diagnosis present

## 2023-07-05 DIAGNOSIS — E785 Hyperlipidemia, unspecified: Secondary | ICD-10-CM | POA: Diagnosis present

## 2023-07-05 DIAGNOSIS — E872 Acidosis, unspecified: Secondary | ICD-10-CM | POA: Diagnosis present

## 2023-07-05 DIAGNOSIS — Z7984 Long term (current) use of oral hypoglycemic drugs: Secondary | ICD-10-CM | POA: Diagnosis not present

## 2023-07-05 DIAGNOSIS — G629 Polyneuropathy, unspecified: Secondary | ICD-10-CM | POA: Diagnosis present

## 2023-07-05 DIAGNOSIS — E1165 Type 2 diabetes mellitus with hyperglycemia: Secondary | ICD-10-CM | POA: Diagnosis present

## 2023-07-05 DIAGNOSIS — I5A Non-ischemic myocardial injury (non-traumatic): Secondary | ICD-10-CM | POA: Diagnosis not present

## 2023-07-05 DIAGNOSIS — G8929 Other chronic pain: Secondary | ICD-10-CM | POA: Diagnosis present

## 2023-07-05 DIAGNOSIS — I472 Ventricular tachycardia, unspecified: Secondary | ICD-10-CM

## 2023-07-05 DIAGNOSIS — I4719 Other supraventricular tachycardia: Secondary | ICD-10-CM | POA: Diagnosis present

## 2023-07-05 DIAGNOSIS — I471 Supraventricular tachycardia, unspecified: Secondary | ICD-10-CM | POA: Diagnosis not present

## 2023-07-05 DIAGNOSIS — A419 Sepsis, unspecified organism: Principal | ICD-10-CM | POA: Diagnosis present

## 2023-07-05 DIAGNOSIS — E118 Type 2 diabetes mellitus with unspecified complications: Secondary | ICD-10-CM | POA: Diagnosis present

## 2023-07-05 DIAGNOSIS — I493 Ventricular premature depolarization: Secondary | ICD-10-CM | POA: Diagnosis present

## 2023-07-05 DIAGNOSIS — G9340 Encephalopathy, unspecified: Secondary | ICD-10-CM | POA: Diagnosis not present

## 2023-07-05 DIAGNOSIS — J189 Pneumonia, unspecified organism: Secondary | ICD-10-CM | POA: Diagnosis present

## 2023-07-05 DIAGNOSIS — J181 Lobar pneumonia, unspecified organism: Secondary | ICD-10-CM | POA: Diagnosis not present

## 2023-07-05 DIAGNOSIS — Z1152 Encounter for screening for COVID-19: Secondary | ICD-10-CM | POA: Diagnosis not present

## 2023-07-05 DIAGNOSIS — Z5111 Encounter for antineoplastic chemotherapy: Secondary | ICD-10-CM

## 2023-07-05 DIAGNOSIS — Z79899 Other long term (current) drug therapy: Secondary | ICD-10-CM | POA: Diagnosis not present

## 2023-07-05 DIAGNOSIS — C771 Secondary and unspecified malignant neoplasm of intrathoracic lymph nodes: Secondary | ICD-10-CM | POA: Diagnosis present

## 2023-07-05 DIAGNOSIS — D61810 Antineoplastic chemotherapy induced pancytopenia: Secondary | ICD-10-CM | POA: Diagnosis present

## 2023-07-05 DIAGNOSIS — D84821 Immunodeficiency due to drugs: Secondary | ICD-10-CM | POA: Diagnosis present

## 2023-07-05 DIAGNOSIS — D696 Thrombocytopenia, unspecified: Secondary | ICD-10-CM | POA: Insufficient documentation

## 2023-07-05 DIAGNOSIS — R197 Diarrhea, unspecified: Secondary | ICD-10-CM | POA: Diagnosis not present

## 2023-07-05 DIAGNOSIS — Z86718 Personal history of other venous thrombosis and embolism: Secondary | ICD-10-CM

## 2023-07-05 DIAGNOSIS — I2510 Atherosclerotic heart disease of native coronary artery without angina pectoris: Secondary | ICD-10-CM | POA: Diagnosis present

## 2023-07-05 DIAGNOSIS — R6520 Severe sepsis without septic shock: Secondary | ICD-10-CM | POA: Diagnosis present

## 2023-07-05 DIAGNOSIS — Z7982 Long term (current) use of aspirin: Secondary | ICD-10-CM

## 2023-07-05 DIAGNOSIS — E871 Hypo-osmolality and hyponatremia: Secondary | ICD-10-CM | POA: Diagnosis present

## 2023-07-05 DIAGNOSIS — G9341 Metabolic encephalopathy: Secondary | ICD-10-CM | POA: Diagnosis not present

## 2023-07-05 DIAGNOSIS — R4182 Altered mental status, unspecified: Principal | ICD-10-CM

## 2023-07-05 DIAGNOSIS — E2749 Other adrenocortical insufficiency: Secondary | ICD-10-CM | POA: Diagnosis not present

## 2023-07-05 DIAGNOSIS — J90 Pleural effusion, not elsewhere classified: Secondary | ICD-10-CM | POA: Diagnosis not present

## 2023-07-05 DIAGNOSIS — I252 Old myocardial infarction: Secondary | ICD-10-CM

## 2023-07-05 DIAGNOSIS — F17210 Nicotine dependence, cigarettes, uncomplicated: Secondary | ICD-10-CM | POA: Diagnosis present

## 2023-07-05 DIAGNOSIS — Z8249 Family history of ischemic heart disease and other diseases of the circulatory system: Secondary | ICD-10-CM

## 2023-07-05 DIAGNOSIS — D649 Anemia, unspecified: Secondary | ICD-10-CM | POA: Diagnosis present

## 2023-07-05 DIAGNOSIS — Z800 Family history of malignant neoplasm of digestive organs: Secondary | ICD-10-CM

## 2023-07-05 DIAGNOSIS — I10 Essential (primary) hypertension: Secondary | ICD-10-CM | POA: Diagnosis not present

## 2023-07-05 DIAGNOSIS — Z888 Allergy status to other drugs, medicaments and biological substances status: Secondary | ICD-10-CM

## 2023-07-05 DIAGNOSIS — Z880 Allergy status to penicillin: Secondary | ICD-10-CM

## 2023-07-05 DIAGNOSIS — R110 Nausea: Secondary | ICD-10-CM | POA: Diagnosis not present

## 2023-07-05 DIAGNOSIS — D61818 Other pancytopenia: Secondary | ICD-10-CM | POA: Diagnosis not present

## 2023-07-05 DIAGNOSIS — R531 Weakness: Secondary | ICD-10-CM | POA: Diagnosis not present

## 2023-07-05 DIAGNOSIS — C3491 Malignant neoplasm of unspecified part of right bronchus or lung: Secondary | ICD-10-CM | POA: Diagnosis present

## 2023-07-05 DIAGNOSIS — T380X5A Adverse effect of glucocorticoids and synthetic analogues, initial encounter: Secondary | ICD-10-CM | POA: Diagnosis present

## 2023-07-05 DIAGNOSIS — R0609 Other forms of dyspnea: Secondary | ICD-10-CM | POA: Diagnosis not present

## 2023-07-05 DIAGNOSIS — C3490 Malignant neoplasm of unspecified part of unspecified bronchus or lung: Secondary | ICD-10-CM

## 2023-07-05 DIAGNOSIS — R222 Localized swelling, mass and lump, trunk: Secondary | ICD-10-CM | POA: Diagnosis not present

## 2023-07-05 DIAGNOSIS — R000 Tachycardia, unspecified: Secondary | ICD-10-CM | POA: Diagnosis not present

## 2023-07-05 DIAGNOSIS — T451X5A Adverse effect of antineoplastic and immunosuppressive drugs, initial encounter: Secondary | ICD-10-CM | POA: Diagnosis present

## 2023-07-05 DIAGNOSIS — I6782 Cerebral ischemia: Secondary | ICD-10-CM | POA: Diagnosis not present

## 2023-07-05 DIAGNOSIS — R569 Unspecified convulsions: Secondary | ICD-10-CM | POA: Diagnosis not present

## 2023-07-05 DIAGNOSIS — R918 Other nonspecific abnormal finding of lung field: Secondary | ICD-10-CM | POA: Diagnosis not present

## 2023-07-05 DIAGNOSIS — E2740 Unspecified adrenocortical insufficiency: Secondary | ICD-10-CM | POA: Diagnosis present

## 2023-07-05 DIAGNOSIS — Z801 Family history of malignant neoplasm of trachea, bronchus and lung: Secondary | ICD-10-CM

## 2023-07-05 LAB — COMPREHENSIVE METABOLIC PANEL WITH GFR
ALT: 30 U/L (ref 0–44)
AST: 26 U/L (ref 15–41)
Albumin: 2.7 g/dL — ABNORMAL LOW (ref 3.5–5.0)
Alkaline Phosphatase: 50 U/L (ref 38–126)
Anion gap: 12 (ref 5–15)
BUN: 15 mg/dL (ref 8–23)
CO2: 23 mmol/L (ref 22–32)
Calcium: 8.6 mg/dL — ABNORMAL LOW (ref 8.9–10.3)
Chloride: 98 mmol/L (ref 98–111)
Creatinine, Ser: 1.07 mg/dL (ref 0.61–1.24)
GFR, Estimated: >60 mL/min (ref >60)
Glucose, Bld: 178 mg/dL — ABNORMAL HIGH (ref 70–99)
Potassium: 3.9 mmol/L (ref 3.5–5.1)
Sodium: 133 mmol/L — ABNORMAL LOW (ref 135–145)
Total Bilirubin: 1.3 mg/dL — ABNORMAL HIGH (ref 0.0–1.2)
Total Protein: 6.3 g/dL — ABNORMAL LOW (ref 6.5–8.1)

## 2023-07-05 LAB — CBC WITH DIFFERENTIAL/PLATELET
Abs Immature Granulocytes: 0.12 10*3/uL — ABNORMAL HIGH (ref 0.00–0.07)
Basophils Absolute: 0.1 10*3/uL (ref 0.0–0.1)
Basophils Relative: 1 %
Eosinophils Absolute: 0 10*3/uL (ref 0.0–0.5)
Eosinophils Relative: 0 %
HCT: 37.5 % — ABNORMAL LOW (ref 39.0–52.0)
Hemoglobin: 12.7 g/dL — ABNORMAL LOW (ref 13.0–17.0)
Immature Granulocytes: 2 %
Lymphocytes Relative: 29 %
Lymphs Abs: 1.5 10*3/uL (ref 0.7–4.0)
MCH: 31.4 pg (ref 26.0–34.0)
MCHC: 33.9 g/dL (ref 30.0–36.0)
MCV: 92.8 fL (ref 80.0–100.0)
Monocytes Absolute: 0.1 10*3/uL (ref 0.1–1.0)
Monocytes Relative: 1 %
Neutro Abs: 3.4 10*3/uL (ref 1.7–7.7)
Neutrophils Relative %: 67 %
Platelets: 74 10*3/uL — ABNORMAL LOW (ref 150–400)
RBC: 4.04 MIL/uL — ABNORMAL LOW (ref 4.22–5.81)
RDW: 15.7 % — ABNORMAL HIGH (ref 11.5–15.5)
Smear Review: DECREASED
WBC: 5.1 10*3/uL (ref 4.0–10.5)
nRBC: 0 % (ref 0.0–0.2)

## 2023-07-05 LAB — RESP PANEL BY RT-PCR (RSV, FLU A&B, COVID)  RVPGX2
Influenza A by PCR: NEGATIVE
Influenza B by PCR: NEGATIVE
Resp Syncytial Virus by PCR: NEGATIVE
SARS Coronavirus 2 by RT PCR: NEGATIVE

## 2023-07-05 LAB — C DIFFICILE QUICK SCREEN W PCR REFLEX
C Diff antigen: NEGATIVE
C Diff interpretation: NOT DETECTED
C Diff toxin: NEGATIVE

## 2023-07-05 LAB — TROPONIN I (HIGH SENSITIVITY)
Troponin I (High Sensitivity): 42 ng/L — ABNORMAL HIGH (ref <18)
Troponin I (High Sensitivity): 24 ng/L — ABNORMAL HIGH (ref <18)

## 2023-07-05 LAB — ETHANOL: Alcohol, Ethyl (B): <15 mg/dL (ref <15)

## 2023-07-05 LAB — PROTIME-INR
INR: 1.1 (ref 0.8–1.2)
Prothrombin Time: 14.3 s (ref 11.4–15.2)

## 2023-07-05 LAB — I-STAT CG4 LACTIC ACID, ED
Lactic Acid, Venous: 2.5 mmol/L (ref 0.5–1.9)
Lactic Acid, Venous: 0.7 mmol/L (ref 0.5–1.9)

## 2023-07-05 LAB — CK: Total CK: 39 U/L — ABNORMAL LOW (ref 49–397)

## 2023-07-05 MED ORDER — ACETAMINOPHEN 650 MG RE SUPP
650.0000 mg | Freq: Once | RECTAL | Status: AC
  Administered 2023-07-05: 650 mg via RECTAL
  Filled 2023-07-05: qty 1

## 2023-07-05 MED ORDER — SODIUM CHLORIDE 0.9 % IV SOLN
2.0000 g | Freq: Three times a day (TID) | INTRAVENOUS | Status: DC
  Administered 2023-07-06 – 2023-07-09 (×10): 2 g via INTRAVENOUS
  Filled 2023-07-05 (×12): qty 10

## 2023-07-05 MED ORDER — AMIODARONE LOAD VIA INFUSION
150.0000 mg | Freq: Once | INTRAVENOUS | Status: AC
  Administered 2023-07-05: 150 mg via INTRAVENOUS
  Filled 2023-07-05: qty 83.34

## 2023-07-05 MED ORDER — METRONIDAZOLE 500 MG/100ML IV SOLN
500.0000 mg | Freq: Once | INTRAVENOUS | Status: AC
  Administered 2023-07-05: 500 mg via INTRAVENOUS
  Filled 2023-07-05: qty 100

## 2023-07-05 MED ORDER — HYDROCORTISONE SOD SUC (PF) 100 MG IJ SOLR
100.0000 mg | Freq: Once | INTRAMUSCULAR | Status: AC
  Administered 2023-07-05: 100 mg via INTRAVENOUS
  Filled 2023-07-05: qty 2

## 2023-07-05 MED ORDER — LACTATED RINGERS IV BOLUS (SEPSIS)
1000.0000 mL | Freq: Once | INTRAVENOUS | Status: AC
  Administered 2023-07-05: 1000 mL via INTRAVENOUS

## 2023-07-05 MED ORDER — METRONIDAZOLE 500 MG/100ML IV SOLN
500.0000 mg | Freq: Two times a day (BID) | INTRAVENOUS | Status: DC
  Administered 2023-07-06 (×2): 500 mg via INTRAVENOUS
  Filled 2023-07-05 (×2): qty 100

## 2023-07-05 MED ORDER — INSULIN ASPART 100 UNIT/ML IJ SOLN
0.0000 [IU] | INTRAMUSCULAR | Status: DC
Start: 2023-07-05 — End: 2023-07-07
  Administered 2023-07-06 (×3): 2 [IU] via SUBCUTANEOUS
  Administered 2023-07-06: 1 [IU] via SUBCUTANEOUS
  Administered 2023-07-06: 2 [IU] via SUBCUTANEOUS
  Administered 2023-07-07 (×2): 1 [IU] via SUBCUTANEOUS

## 2023-07-05 MED ORDER — LACTATED RINGERS IV SOLN: INTRAVENOUS | Status: AC

## 2023-07-05 MED ORDER — AMIODARONE HCL IN DEXTROSE 360-4.14 MG/200ML-% IV SOLN
30.0000 mg/h | INTRAVENOUS | Status: DC
  Administered 2023-07-06 (×2): 30 mg/h via INTRAVENOUS
  Filled 2023-07-05: qty 200

## 2023-07-05 MED ORDER — ACETAMINOPHEN 500 MG PO TABS
1000.0000 mg | ORAL_TABLET | Freq: Once | ORAL | Status: DC
  Filled 2023-07-05: qty 2

## 2023-07-05 MED ORDER — VANCOMYCIN HCL 2000 MG/400ML IV SOLN
2000.0000 mg | Freq: Once | INTRAVENOUS | Status: AC
  Administered 2023-07-06: 2000 mg via INTRAVENOUS
  Filled 2023-07-05: qty 400

## 2023-07-05 MED ORDER — SODIUM CHLORIDE 0.9 % IV SOLN
2.0000 g | Freq: Once | INTRAVENOUS | Status: AC
  Administered 2023-07-05: 2 g via INTRAVENOUS
  Filled 2023-07-05: qty 10

## 2023-07-05 MED ORDER — AMIODARONE HCL IN DEXTROSE 360-4.14 MG/200ML-% IV SOLN
60.0000 mg/h | INTRAVENOUS | Status: DC
Start: 2023-07-05 — End: 2023-07-06
  Administered 2023-07-05 – 2023-07-06 (×2): 60 mg/h via INTRAVENOUS
  Filled 2023-07-05 (×2): qty 200

## 2023-07-05 NOTE — ED Notes (Signed)
 Pt converted to ST. Pt remains on [facility]. New orders placed.

## 2023-07-05 NOTE — ED Provider Notes (Signed)
  EMERGENCY DEPARTMENT AT [facility] ([place] Provider Note   CSN: [id number] Arrival date & time: 07/05/23  1721     History  Chief Complaint  Patient presents with   Altered Mental Status    Brandon Rubio. is a 73 y.o. male with PMH as listed below who presents BIB [facility] from home after friend found PT lying on cough soiled and extremely confused. Friend last saw well on Wed the 23rd. PT normally Aox4, currently altered alert to self and situation only. Hx of lung cancer. Denies any pain anywhere, endorses some mild SOB. Unsure what happened today. Level 5 caveat.  Currently receiving chemotherapy for metastatic non-small cell lung cancer, favoring adenocarcinoma with large right lower lobe lung mass metastatic to right hilar and mediastinal lymph nodes in December 2022.  Currently receiving Second line systemic chemotherapy with docetaxel  65 Mg/M2 and ramucirumab  10 Mg/KG every 3 weeks with Neulasta  support.  First dose April 08, 2023.  Status post 4 cycles. Last infusion 07/01/22.  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 Authorizing Provider  aspirin  EC 81 MG tablet Take 81 mg by mouth in the morning. Swallow whole.   Yes [provider]  bisacodyl (DULCOLAX) 5 MG EC tablet Take 5 mg by mouth daily as needed for moderate constipation.   Yes [provider]  dexamethasone  (DECADRON ) 0.75 MG tablet Take 1 tablet (0.75 mg total) by mouth  daily. 07/02/23  Yes Emilie Harden, MD  dexamethasone  (DECADRON ) 4 MG tablet 2 tablet p.o. twice daily the day before, day of and day after chemotherapy every 3 weeks 07/01/23  Yes Marlene Simas, MD  folic acid  (FOLVITE ) 1 MG tablet TAKE 1 TABLET BY MOUTH EVERY DAY 01/04/23  Yes Marlene Simas, MD  HYDROcodone -acetaminophen  (NORCO) 10-325 MG tablet Take 1 tablet by mouth every 12 (twelve) hours as needed. 06/18/23  Yes Adelia Homestead, MD  Magnesium  Gluconate 550 MG TABS Take 1 tablet (550 mg total) by mouth 2 (two) times daily. 11/03/22  Yes Adelia Homestead, MD  metFORMIN  (GLUCOPHAGE ) 1000 MG tablet TAKE 1 TABLET(1000 MG) BY MOUTH TWICE DAILY WITH A MEAL 06/22/23  Yes Adelia Homestead, MD  metoprolol  tartrate (LOPRESSOR ) 25 MG tablet Take 1 tablet (25 mg total) by mouth 2 (two) times daily. 04/30/23  Yes Hugh Madura, MD  Multiple Vitamins-Minerals (CENTRUM SILVER 50+MEN) TABS Take 1 tablet by mouth daily with breakfast.   Yes [provider]  omeprazole  (PRILOSEC) 20 MG capsule TAKE 1 CAPSULE BY MOUTH TWICE DAILY BEFORE A MEAL 02/17/23  Yes Marlene Simas, MD  ondansetron  (ZOFRAN ) 8 MG tablet Take 1 tablet (8 mg total) by mouth every 8 (eight) hours as needed for nausea or vomiting. 06/09/22  Yes Heilingoetter, [name], PA-C  prochlorperazine  (COMPAZINE ) 10 MG tablet TAKE 1 TABLET(10 MG) BY MOUTH EVERY 6 HOURS FOR UP TO 14  DAYS AS NEEDED FOR NAUSEA OR VOMITING 12/03/22  Yes Heilingoetter, [name], PA-C  simvastatin  (ZOCOR ) 40 MG tablet TAKE 1 TABLET BY MOUTH EVERY EVENING AT 6 PM. Patient taking differently: Take 40 mg by mouth in the morning. 09/12/22  Yes Adelia Homestead, MD      Allergies    Penicillins, Albumin  (human), and Cyramza  [ramucirumab ]    Review of Systems   Review of Systems A 10 point review of systems was performed and is negative unless otherwise reported in HPI.  Physical Exam Updated Vital Signs BP (!) 149/76   Pulse (!) 127    Temp (!) 102.2 F (39 C) (Rectal)   Resp (!) 24   Ht 6' (1.829 m)   Wt 90.7 kg   SpO2 96%   BMI 27.12 kg/m  Physical Exam General: acutely-ill appearing elderly male, lying in bed.  HEENT: NCAT, PERRLA, Sclera anicteric, dry mucous membranes, trachea midline.  Cardiology: Regular tachycaridc rate, no murmurs/rubs/gallops.  Resp: mildly tachypneic with no increased WOB or resp distress. Decreased lung sounds on the left.   Abd: Soft, non-tender, non-distended. No rebound tenderness or guarding.  GU: Deferred. MSK: No peripheral edema or signs of trauma. Extremities without deformity or TTP. No cyanosis or clubbing. Skin: warm, dry. Neuro: A&Ox1, CNs II-XII grossly intact. MAEs. Sensation grossly intact.   ED Results / Procedures / Treatments   Labs (all labs ordered are listed, but only abnormal results are displayed) Labs Reviewed  COMPREHENSIVE METABOLIC PANEL WITH GFR - Abnormal; Notable for the following components:      Result Value   Sodium 133 (*)    Glucose, Bld 178 (*)    Calcium 8.6 (*)    Total Protein 6.3 (*)    Albumin  2.7 (*)    Total Bilirubin 1.3 (*)    All other components within normal limits  CBC WITH DIFFERENTIAL/PLATELET - Abnormal; Notable for the following components:   RBC 4.04 (*)    Hemoglobin 12.7 (*)    HCT 37.5 (*)    RDW 15.7 (*)    Platelets 74 (*)    Abs Immature Granulocytes 0.12 (*)    All other components within normal limits  I-STAT CG4 LACTIC ACID, ED - Abnormal; Notable for the following components:   Lactic Acid, Venous 2.5 (*)    All other components within normal limits  TROPONIN I (HIGH SENSITIVITY) - Abnormal; Notable for the following components:   Troponin I (High Sensitivity) 42 (*)    All other components within normal limits  RESP PANEL BY RT-PCR (RSV, FLU A&B, COVID)  RVPGX2  CULTURE, BLOOD (ROUTINE X 2)  CULTURE, BLOOD (ROUTINE X 2)  C DIFFICILE QUICK SCREEN W PCR REFLEX    ETHANOL  PROTIME-INR  RAPID URINE DRUG  SCREEN, HOSP PERFORMED  URINALYSIS, W/ REFLEX TO CULTURE (INFECTION SUSPECTED)  CK  MAGNESIUM   CALCIUM, IONIZED  CBG MONITORING, ED  I-STAT CG4 LACTIC ACID, ED  TROPONIN I (HIGH SENSITIVITY)    EKG None  Radiology CT Head Wo Contrast Result Date: 07/05/2023 CLINICAL DATA:  Mental status change, unknown cause EXAM: CT HEAD WITHOUT CONTRAST TECHNIQUE: Contiguous axial images were obtained from the base of the skull through the vertex without intravenous contrast. RADIATION DOSE REDUCTION: This exam was performed according to the departmental dose-optimization program which includes automated exposure control, adjustment of the mA and/or kV according to patient size and/or use of iterative reconstruction technique. COMPARISON:  05/10/2021. FINDINGS: Brain: There is periventricular white matter decreased  attenuation consistent with small vessel ischemic changes. Ventricles, sulci and cisterns are prominent consistent with age related involutional changes. No acute intracranial hemorrhage, mass effect or shift. No hydrocephalus. Vascular: No hyperdense vessel or unexpected calcification. Skull: Normal. Negative for fracture or focal lesion. Sinuses/Orbits: No acute finding. IMPRESSION: Atrophy and chronic small vessel ischemic changes. No acute intracranial process identified. Electronically Signed   By: Sydell Eva M.D.   On: 07/05/2023 19:58   DG Chest Portable 1 View Result Date: 07/05/2023 CLINICAL DATA:  Altered mental status.  Found down. EXAM: PORTABLE CHEST 1 VIEW COMPARISON:  10/02/2022. FINDINGS: Dense consolidation at the left base is somewhat mass-like appearing. The this could represent pneumonia and follow up is recommended to confirm resolution. There is linear subsegmental atelectasis or scarring in the right perihilar region. There is density consistent with consolidation or volume loss in the medial right base. No pneumothorax. No definite pleural effusion. Normal pulmonary  vasculature. IMPRESSION: Bibasilar opacities, left greater than right. Follow-up recommended to confirm resolution. Electronically Signed   By: Sydell Eva M.D.   On: 07/05/2023 19:56    Procedures .Critical Care  Performed by: Merdis Stalling, MD Authorized by: Merdis Stalling, MD   Critical care provider statement:    Critical care time (minutes):  60   Critical care was necessary to treat or prevent imminent or life-threatening deterioration of the following conditions:  Sepsis and circulatory failure   Critical care was time spent personally by me on the following activities:  Development of treatment plan with patient or surrogate, discussions with consultants, evaluation of patient's response to treatment, examination of patient, ordering and review of laboratory studies, ordering and review of radiographic studies, ordering and performing treatments and interventions, pulse oximetry, re-evaluation of patient's condition, review of old charts and obtaining history from patient or surrogate   Care discussed with: admitting provider       Medications Ordered in ED Medications  lactated ringers  infusion ( Intravenous New Bag/Given 07/05/23 2042)  amiodarone  (NEXTERONE ) 1.8 mg/mL load via infusion 150 mg (150 mg Intravenous Bolus from Bag 07/05/23 2110)    Followed by  amiodarone  (NEXTERONE  PREMIX) 360-4.14 MG/200ML-% (1.8 mg/mL) IV infusion (has no administration in time range)    Followed by  amiodarone  (NEXTERONE  PREMIX) 360-4.14 MG/200ML-% (1.8 mg/mL) IV infusion (has no administration in time range)  acetaminophen  (TYLENOL ) tablet 1,000 mg (has no administration in time range)  aztreonam (AZACTAM) 2 g in sodium chloride  0.9 % 100 mL IVPB (0 g Intravenous Stopped 07/05/23 1846)  metroNIDAZOLE (FLAGYL) IVPB 500 mg (0 mg Intravenous Stopped 07/05/23 1932)  lactated ringers  bolus 1,000 mL (0 mLs Intravenous Stopped 07/05/23 1855)    ED Course/ Medical Decision Making/ A&P                           Medical Decision Making Amount and/or Complexity of Data Reviewed Labs: ordered. Decision-making details documented in ED Course. Radiology: ordered. Decision-making details documented in ED Course.  Risk OTC drugs. Prescription drug management. Decision regarding hospitalization.    This patient presents to the ED for concern of AMS, fever/tachycardia, this involves an extensive number of treatment options, and is a complaint that carries with it a high risk of complications and morbidity.  I considered the following differential and admission for this acute, potentially life threatening condition.   MDM:    Ddx of acute altered mental status or encephalopathy considered but not limited to: -Intracranial  abnormalities such as ICH, hydrocephalus, head trauma - reassuringly CTH neg. Also did not show obvious mets in s/o metastatic lung CA.  -Infection such as UTI, PNA - febrile, tachycardic, tachypneic - code sepsis called and patient ordered blood cx's, fluids, abx. Penicillin allergic. CXR does show PNA.  -Toxic ingestion - etoh neg, consider polypharmacy -Electrolyte abnormalities or hyper/hypoglycemia -No significant hypoxia, stable on RA -ACS or arrhythmia - EKG not ischemic, trop mildly elevated will trend    Clinical Course as of 07/05/23 2114  Sun Jul 05, 2023  1808 Lactic Acid, Venous(!!): 2.5 Receiving fluids [HN]  1808 DG Chest Portable 1 View C/f LLL consolidation vs mass? [HN]  1909 Troponin I (High Sensitivity)(!): 42 Mildly elevated, likely demand from tachycardia, will trend [HN]  1909 Platelets(!): 74 Worsening thrombocytopenia [HN]  1909 Coags wnl [HN]  1910 Altered patient w/ thrombocytopenia needs head CT, called CT to expedite. [HN]  2002 CT Head Wo Contrast Atrophy and chronic small vessel ischemic changes. No acute intracranial process identified.   [HN]  2002 DG Chest Portable 1 View Bibasilar opacities, left greater than right.  Follow-up recommended to confirm resolution.   [HN]  2003 WBC: 5.1 No leukocytosis  [HN]  2052 Patient with an episode of sustained vtach, rate 230 bpm on telemetry. Pads were placed on patient and during pad placement/preparation to cardiovert, patient converted back to sinus tachycardia. Multiple PACs/PVCs noted. Patient's granddaughter now at bedside and unaware of any history of VT. Does have h/o per chart review of SVT. Will give IV amio bolus/drip and consult with cardiology. [HN]  2102 Patient with continued diarrhea, throughout his visit here.  [HN]  2106 Temp(!): 102.2 F (39 C) Will give tylenol  [HN]  2106 D/w cardiology who agrees w/ amio and will see patient. Likely in s/o sepsis. Heart appears structurally normal on echo. Also w/ significant coronary calcifications on prior CT scan.  [HN]    Clinical Course User Index [HN] Merdis Stalling, MD    Labs: I Ordered, and personally interpreted labs.  The pertinent results include:  those listed above  Imaging Studies ordered: I ordered imaging studies including CTH, CXR I independently visualized and interpreted imaging. I agree with the radiologist interpretation  Additional history obtained from chart review, granddaughter at bedside.    Cardiac Monitoring: The patient was maintained on a cardiac monitor.  I personally viewed and interpreted the cardiac monitored which showed an underlying rhythm of: sinus tachycardia, VT  Reevaluation: After the interventions noted above, I reevaluated the patient and found that they have :improved  Social Determinants of Health: Lives independently  Disposition:  admitted to medicine  Co morbidities that complicate the patient evaluation  Past Medical History:  Diagnosis Date   CAD (coronary artery disease) 10/2007   NSTEMI w/ dLAD occlusion >> med rx   Cancer (HCC)    SKIN.Aaron AasBASAL CELL   Cataract    Diabetes mellitus    2   DVT (deep venous thrombosis) (HCC) 03/30/2001    RLE DVT, post ankle fracture   GERD (gastroesophageal reflux disease)    Hyperlipidemia    Hypertension    Lung cancer (HCC) 12/10/2020   Myocardial infarct, 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   metroNIDAZOLE (FLAGYL) IVPB 500 mg    Antibiotic Indication::   Other Indication (list below)    Other Indication::   Unknown Source.   lactated ringers  bolus 1,000 mL    Reason 30 mL/kg dose is not being ordered:   First Lactic Acid Pending   FOLLOWED BY Linked Order Group    amiodarone  (NEXTERONE ) 1.8 mg/mL load via infusion 150 mg    amiodarone  (NEXTERONE  PREMIX) 360-4.14 MG/200ML-% (1.8 mg/mL) IV infusion    amiodarone  (NEXTERONE  PREMIX) 360-4.14 MG/200ML-% (1.8 mg/mL) IV infusion   acetaminophen  (TYLENOL ) tablet 1,000 mg    I have reviewed the patients home medicines and have made adjustments as needed  Problem List / ED Course: Problem List Items Addressed This Visit   None Visit Diagnoses       Altered mental status, unspecified altered mental status type    -  Primary     Pneumonia of both lower lobes due to infectious organism       Relevant Medications   aztreonam (AZACTAM) 2 g in sodium chloride  0.9 % 100 mL IVPB (Completed)   metroNIDAZOLE (FLAGYL) IVPB 500 mg (Completed)     Sustained ventricular tachycardia (HCC)       Relevant Medications   amiodarone  (NEXTERONE ) 1.8 mg/mL load via infusion 150 mg   amiodarone  (NEXTERONE  PREMIX) 360-4.14 MG/200ML-% (1.8 mg/mL) IV infusion   amiodarone  (NEXTERONE  PREMIX) 360-4.14 MG/200ML-% (1.8 mg/mL) IV infusion (Start on 07/06/2023  3:00 AM)                   This note was created using dictation software, which may contain spelling or grammatical errors.     Merdis Stalling, MD 07/05/23 2114

## 2023-07-05 NOTE — Consult Note (Signed)
 Cardiology Consultation:   Patient ID: Brandon Rubio. MRN: [id number]; DOB: 01-21-1951  Admit date: 07/05/2023 Date of Consult: 07/05/2023  Primary Care Provider: Adelia Homestead, MD [facility] Cardiologist: Dorothye Gathers, MD  [facility] Electrophysiologist:  None    Patient Profile:   Brandon Rubio. is a 73 y.o. male with a hx of NSTEMI in 2009 and cath with 99% distal LAD treated medically (Dr. 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  History of Present Illness:   [name] was brought in from home after being found by a friend lying on the couch soiled and confused.  Friend last saw the patient on Wednesday 4/23.  He was extremely confused.  EMS was called, and he was brought to the emergency department.  In the ED, he was found to be very altered, thought to have pneumonia and sepsis.  While in the ED, reportedly went into tachycardia to the 220s.  The patient was prepped for emergent cardioversion, however converted back to normal sinus rhythm immediately before DCCV.  The patient is on palliative chemotherapy for metastatic small non-small cell lung cancer.  He is  on docetaxel  and ramucirumab .  On my examination, the patient is completely unresponsive and unable to provide any history.  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 ENDOSCOPY;  Service: Pulmonary;;   BRONCHIAL BRUSHINGS  12/10/2020   Procedure: BRONCHIAL BRUSHINGS;  Surgeon: Denson Flake, MD;  Location: [facility] ENDOSCOPY;  Service: Pulmonary;;   BRONCHIAL NEEDLE ASPIRATION BIOPSY  12/10/2020   Procedure: BRONCHIAL NEEDLE ASPIRATION BIOPSIES;  Surgeon: Denson Flake, MD;  Location: [facility] ENDOSCOPY;  Service: Pulmonary;;   COLONOSCOPY     COLONOSCOPY W/ POLYPECTOMY  03/10/2009   LARYNGOSCOPY  03/10/1997   VIDEO BRONCHOSCOPY WITH ENDOBRONCHIAL NAVIGATION N/A 12/10/2020   Procedure: ROBOTIC VIDEO BRONCHOSCOPY WITH ENDOBRONCHIAL NAVIGATION;  Surgeon: Denson Flake, MD;  Location: [facility] ENDOSCOPY;  Service: Pulmonary;  Laterality: N/A;     Home Medications:  Prior to Admission medications   Medication Sig Start Date End Date Taking? Authorizing Provider  aspirin  EC 81 MG tablet Take 81 mg by mouth in the morning. Swallow whole.   Yes [provider]  bisacodyl (DULCOLAX) 5 MG EC tablet Take 5 mg by mouth daily as needed for moderate constipation.   Yes [provider]  dexamethasone  (DECADRON ) 0.75 MG tablet Take 1 tablet (0.75 mg total) by mouth daily. 07/02/23  Yes Emilie Harden, MD  dexamethasone  (DECADRON ) 4 MG tablet 2 tablet p.o. twice daily the day before, day of and day after chemotherapy every 3 weeks 07/01/23  Yes Marlene Simas, MD  folic acid  (FOLVITE ) 1 MG tablet TAKE 1 TABLET BY MOUTH EVERY DAY 01/04/23  Yes Marlene Simas, MD  HYDROcodone -acetaminophen  (NORCO) 10-325 MG tablet Take 1 tablet by mouth every 12 (twelve) hours as needed. 06/18/23  Yes Adelia Homestead, MD  Magnesium  Gluconate 550 MG TABS Take 1 tablet (550 mg total) by mouth 2 (two) times daily. 11/03/22  Yes Adelia Homestead, MD  metFORMIN  (GLUCOPHAGE ) 1000 MG tablet TAKE 1 TABLET(1000 MG)  BY MOUTH TWICE DAILY WITH A MEAL 06/22/23  Yes Adelia Homestead, MD  metoprolol  tartrate (LOPRESSOR ) 25 MG tablet Take 1 tablet (25 mg total) by mouth 2 (two) times daily. 04/30/23  Yes Hugh Madura, MD  Multiple Vitamins-Minerals (CENTRUM SILVER 50+MEN) TABS Take 1 tablet by mouth daily with breakfast.   Yes [provider]  omeprazole  (PRILOSEC) 20 MG capsule TAKE 1 CAPSULE BY MOUTH TWICE DAILY BEFORE A MEAL 02/17/23  Yes Marlene Simas, MD  ondansetron  (ZOFRAN ) 8 MG tablet Take 1 tablet (8 mg total) by mouth every 8 (eight) hours as needed for nausea or vomiting. 06/09/22  Yes Heilingoetter, [name], PA-C  prednisoLONE 5 MG TABS tablet Take 5 mg by mouth. Unknown Frequency.Aaron AasAaron AasDo not take on days when taking Decadron .   Yes [provider]  prochlorperazine  (COMPAZINE ) 10 MG tablet TAKE 1 TABLET(10 MG) BY MOUTH EVERY 6 HOURS FOR UP TO 14 DAYS AS NEEDED FOR NAUSEA OR VOMITING 12/03/22  Yes Heilingoetter, [name], PA-C  simvastatin  (ZOCOR ) 40 MG tablet TAKE 1 TABLET BY MOUTH EVERY EVENING AT 6 PM. Patient taking differently: Take 40 mg by mouth in the morning. 09/12/22  Yes Adelia Homestead, MD    Inpatient Medications: Scheduled Meds:  insulin  aspart  0-6 Units Subcutaneous Q4H   Continuous Infusions:  amiodarone  60 mg/hr (07/05/23 2133)   Followed by   Cecily Cohen ON 07/06/2023] amiodarone      [START ON 07/06/2023] aztreonam     lactated ringers  150 mL/hr at 07/05/23 2042   [START ON 07/06/2023] metronidazole     vancomycin     PRN Meds:   Allergies:    Allergies  Allergen Reactions   Penicillins Anaphylaxis, Swelling and Other (See Comments)    THE THROAT SWELLS!!!! Because of a history of documented adverse serious drug reaction;[name] Alert bracelet  is recommended   Albumin  (Human) Hives and Itching   Cyramza  [Ramucirumab ] Itching    Social History:   Social History   Socioeconomic History   Marital status: Divorced    Spouse name: Not on file    Number of children: 0   Years of education: Not on file   Highest education level: Not on file  Occupational History   Occupation: Retired Personnel officer  Tobacco Use   Smoking status: Every Day    Current packs/day: 1.50    Average packs/day: 1.5 packs/day for 58.0 years (87.0 ttl pk-yrs)    Types: Cigarettes   Smokeless tobacco: Never   Tobacco comments:    2+ packs smoked daily ARJ 12/05/20  Vaping Use   Vaping status: Never Used  Substance and Sexual Activity   Alcohol use: No    Alcohol/week: 0.0 standard drinks of alcohol    Comment:  none since 04/2012   Drug use: Never   Sexual activity: Not on file  Other Topics Concern   Not on file  Social History Narrative   REG EXERCISE         Social Drivers of Health   Financial Resource Strain: Low  Risk  (09/15/2022)   Overall Financial Resource Strain (CARDIA)    Difficulty of Paying Living Expenses: Not hard at all  Food Insecurity: No Food Insecurity (10/08/2022)   Hunger Vital Sign    Worried About Running Out of Food in the Last Year: Never true    Ran Out of Food in the Last Year: Never true  Transportation Needs: No Transportation Needs (10/08/2022)   PRAPARE - Administrator, Civil Service (Medical): No    Lack of Transportation (Non-Medical): No  Physical Activity: Inactive (09/15/2022)   Exercise Vital Sign    Days of Exercise per Week: 0 days    Minutes of Exercise per Session: 0 min  Stress: No Stress Concern Present (09/15/2022)   [employer] of Occupational Health - Occupational Stress Questionnaire    Feeling of Stress : Not at all  Social Connections: Socially Isolated (09/15/2022)   Social Connection and Isolation Panel [NHANES]    Frequency of Communication with Friends and Family: More than three times a week    Frequency of Social Gatherings with Friends and Family: Three times a week    Attends Religious Services: Never    Active Member of Clubs or Organizations: No    Attends Tax inspector Meetings: Never    Marital Status: Divorced  Catering manager Violence: Not At Risk (10/02/2022)   Humiliation, Afraid, Rape, and Kick questionnaire    Fear of Current or Ex-Partner: No    Emotionally Abused: No    Physically Abused: No    Sexually Abused: No    Family History:    Family History  Problem Relation Age of Onset   Lung cancer Mother        X 2   Colon polyps Father    Colon cancer Father 38   Coronary artery disease Father    Lung disease Father    Esophageal cancer Neg Hx    Stomach cancer Neg Hx    Rectal cancer Neg Hx      ROS:  Review of systems unable to be obtained as patient is completely unresponsive on my examination Review of Systems: [y] = yes, [ ]  = no      General: Weight gain [ ] ; Weight loss [ ] ; Anorexia [ ] ; Fatigue [ ] ; Fever [ ] ; Chills [ ] ; Weakness [ ]    Cardiac: Chest pain/pressure [ ] ; Resting SOB [ ] ; Exertional SOB [ ] ; Orthopnea [ ] ; Pedal Edema [ ] ; Palpitations [ ] ; Syncope [ ] ; Presyncope [ ] ; Paroxysmal nocturnal dyspnea [ ]    Pulmonary: Cough [ ] ; Wheezing [ ] ; Hemoptysis [ ] ; Sputum [ ] ; Snoring [ ]    GI: Vomiting [ ] ; Dysphagia [ ] ; Melena [ ] ; Hematochezia [ ] ; Heartburn [ ] ; Abdominal pain [ ] ; Constipation [ ] ; Diarrhea [ ] ; BRBPR [ ]    GU: Hematuria [ ] ; Dysuria [ ] ; Nocturia [ ]  Vascular: Pain in legs with walking [ ] ; Pain in feet with lying flat [ ] ; Non-healing sores [ ] ; Stroke [ ] ; TIA [ ] ; Slurred speech [ ] ;   Neuro: Headaches [ ] ; Vertigo [ ] ; Seizures [ ] ; Paresthesias [ ] ;Blurred vision [ ] ; Diplopia [ ] ; Vision changes [ ]    Ortho/Skin: Arthritis [ ] ; Joint pain [ ] ; Muscle pain [ ] ; Joint swelling [ ] ; Back Pain [ ] ; Rash [ ]    Psych: Depression [ ] ; Anxiety [ ]    Heme: Bleeding problems [ ] ; Clotting disorders [ ] ;  Anemia [ ]    Endocrine: Diabetes [ ] ; Thyroid  dysfunction [ ]    Physical Exam/Data:   Vitals:   07/05/23 2049 07/05/23 2130 07/05/23 2200 07/05/23 2215  BP:  (!) 141/84 138/60   Pulse:  (!) 235 (!) 122 (!) 119   Resp: (!) 23 18    Temp:      TempSrc:      SpO2: 93% 94%  94%  Weight:      Height:        Intake/Output Summary (Last 24 hours) at 07/05/2023 2354 Last data filed at 07/05/2023 1932 Gross per 24 hour  Intake 1169.58 ml  Output --  Net 1169.58 ml      07/05/2023    5:57 PM 07/01/2023    9:36 AM 06/29/2023    1:05 PM  Last 3 Weights  Weight (lbs) 200 lb 202 lb 1.6 oz 202 lb 3.2 oz  Weight (kg) 90.719 kg 91.672 kg 91.717 kg     Body mass index is 27.12 kg/m.  General: Appears critically ill sprawled out on emergency department bed Endocrine:  No thryomegaly Vascular: No carotid bruits; FA pulses 2+ bilaterally without bruits  Cardiac:  normal S1, S2; tachycardic, regular rate; no murmur  Lungs: Coarse breath sounds Abd: soft, no hepatomegaly  Ext: no edema Musculoskeletal:  No deformities, BUE and BLE strength normal and equal Skin: warm and dry  Neuro:  CNs 2-12 intact, no focal abnormalities noted Psych:  Normal affect   EKG:  The EKG was personally reviewed and demonstrates: Sinus tachycardia at 130 bpm with multiform premature ventricular contractions Telemetry:  Telemetry was personally reviewed and demonstrates: An episode of SVT versus VT at ventricular rate 230 bpm.  Only a single lead is available for review, but shows unchanged axis from his prior sinus beats, and is not quite as wide as PVCs on the telemetry strip before that.  Relevant CV Studies: 1. Left ventricular ejection fraction, by estimation, is 60 to 65%. The  left ventricle has normal function. The left ventricle has no regional  wall motion abnormalities. Left ventricular diastolic parameters are  indeterminate.   2. Right ventricular systolic function is normal. The right ventricular  size is normal.   3. The mitral valve is normal in structure. No evidence of mitral valve  regurgitation. No evidence of mitral stenosis.   4. The aortic valve is normal in structure. Aortic  valve regurgitation is  not visualized. No aortic stenosis is present.   5. There is mild dilatation of the ascending aorta, measuring 40 mm.  There is borderline dilatation of the aortic root, measuring 38 mm.   6. The inferior vena cava is normal in size with greater than 50%  respiratory variability, suggesting right atrial pressure of 3 mmHg.   Laboratory Data:  High Sensitivity Troponin:   Recent Labs  Lab 07/05/23 1744 07/05/23 2035  TROPONINIHS 42* 24*     Chemistry Recent Labs  Lab 07/01/23 0901 07/05/23 1744  NA 137 133*  K 4.5 3.9  CL 105 98  CO2 21* 23  GLUCOSE 438* 178*  BUN 19 15  CREATININE 1.00 1.07  CALCIUM 8.3* 8.6*  GFRNONAA >60 >60  ANIONGAP 11 12    Recent Labs  Lab 07/01/23 0901 07/05/23 1744  PROT 6.3* 6.3*  ALBUMIN  3.7 2.7*  AST 19 26  ALT 30 30  ALKPHOS 57 50  BILITOT 0.5 1.3*   Hematology Recent Labs  Lab 07/01/23 0901 07/05/23 1744  WBC 11.7*  5.1  RBC 3.92* 4.04*  HGB 12.1* 12.7*  HCT 36.4* 37.5*  MCV 92.9 92.8  MCH 30.9 31.4  MCHC 33.2 33.9  RDW 16.2* 15.7*  PLT 125* 74*   BNPNo results for input(s): "BNP", "PROBNP" in the last 168 hours.  DDimer No results for input(s): "DDIMER" in the last 168 hours.  Radiology/Studies:  CT Head Wo Contrast Result Date: 07/05/2023 CLINICAL DATA:  Mental status change, unknown cause EXAM: CT HEAD WITHOUT CONTRAST TECHNIQUE: Contiguous axial images were obtained from the base of the skull through the vertex without intravenous contrast. RADIATION DOSE REDUCTION: This exam was performed according to the departmental dose-optimization program which includes automated exposure control, adjustment of the mA and/or kV according to patient size and/or use of iterative reconstruction technique. COMPARISON:  05/10/2021. FINDINGS: Brain: There is periventricular white matter decreased attenuation consistent with small vessel ischemic changes. Ventricles, sulci and cisterns are prominent consistent with  age related involutional changes. No acute intracranial hemorrhage, mass effect or shift. No hydrocephalus. Vascular: No hyperdense vessel or unexpected calcification. Skull: Normal. Negative for fracture or focal lesion. Sinuses/Orbits: No acute finding. IMPRESSION: Atrophy and chronic small vessel ischemic changes. No acute intracranial process identified. Electronically Signed   By: Sydell Eva M.D.   On: 07/05/2023 19:58   DG Chest Portable 1 View Result Date: 07/05/2023 CLINICAL DATA:  Altered mental status.  Found down. EXAM: PORTABLE CHEST 1 VIEW COMPARISON:  10/02/2022. FINDINGS: Dense consolidation at the left base is somewhat mass-like appearing. The this could represent pneumonia and follow up is recommended to confirm resolution. There is linear subsegmental atelectasis or scarring in the right perihilar region. There is density consistent with consolidation or volume loss in the medial right base. No pneumothorax. No definite pleural effusion. Normal pulmonary vasculature. IMPRESSION: Bibasilar opacities, left greater than right. Follow-up recommended to confirm resolution. Electronically Signed   By: Sydell Eva M.D.   On: 07/05/2023 19:56     Assessment and Plan:   Brandon Rubio. is a 73 y.o. male with a hx of NSTEMI in 2009 and cath with 99% distal LAD treated medically (Dr. 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  SVT vs VT Hx of SVT Presenting with pneumonia and sepsis along with critical illness and unresponsiveness.  In the emergency department developed tachycardia with ventricular rate 230 bpm.  We do not have an EKG from this time, and telemetry only has 1-lead; that 1-lead shows a QRS axis that is unchanged from his prior sinus beats, and that is  slightly less wide than his prior PVCs. He has a history of SVT at 170s for 3 min on 10/04/22 and converted spontaneously to SR. On 10/05/22 he again had SVT to 190 for 60 min.   I am unable to state whether this episode today (2025) represented ventricular tachycardia or an episode of SVT, but given the axis being unchanged on telemetry and history, I think this slightly favors SVT.  He was appropriately started on amiodarone  given that he was unstable, and he is being medically treated for pneumonia and sepsis.  He would benefit from continued amiodarone  while critically ill.  Suspect that this arrhythmia was triggered by his inflammatory state. - Continue IV amiodarone  load while critically ill to help avoid future episodes of SVT versus  VT.  Can peel off amiodarone  once stable - Agree with holding metoprolol  for now, but would restart home metoprolol  once clinically stable - Keep pads on patient - Continue telemetry - Treatment of sepsis and PNA per primary team  CAD s/ hx of NSTEM HLD hx of NSTEMI in 2009 and cath with 99% distal LAD treated medically (Dr. Grandville Lax)  - Continue home aspirin  and statin  Acute Myocardial Injury Downtrending troponins from 42 -> 24, likely myocardial injury in the setting of sepsis and critical illness. Unable to provide any history. ECG without ST changing. - ctm      For questions or updates, please contact [facility] Please consult [email or web address] for contact info under     Signed, Chauncey Cora, MD  07/05/2023 11:54 PM

## 2023-07-05 NOTE — Progress Notes (Signed)
 Pharmacy Antibiotic Note  Brandon Rubio. is a 73 y.o. male admitted on 07/05/2023 with {Indications:3041527}.  Pharmacy has been consulted for vancomycin and aztreonam dosing.  Plan: Vancomycin 2g >    Height: 6' (182.9 cm) Weight: 90.7 kg (200 lb) IBW/kg (Calculated) : 77.6  Temp (24hrs), Avg:100.4 F (38 C), Min:98.5 F (36.9 C), Max:102.2 F (39 C)  Recent Labs  Lab 07/01/23 0901 07/05/23 1744 07/05/23 1755 07/05/23 2045  WBC 11.7* 5.1  --   --   CREATININE 1.00 1.07  --   --   LATICACIDVEN  --   --  2.5* 0.7    Estimated Creatinine Clearance: 65.5 mL/min (by C-G formula based on SCr of 1.07 mg/dL).    Allergies  Allergen Reactions   Penicillins Anaphylaxis, Swelling and Other (See Comments)    THE THROAT SWELLS!!!! Because of a history of documented adverse serious drug reaction;[name] Alert bracelet  is recommended   Albumin  (Human) Hives and Itching   Cyramza  [Ramucirumab ] Itching    Antimicrobials this admission: *** *** >> *** *** *** >> ***  Dose adjustments this admission: ***  Microbiology results: *** BCx: *** *** UCx: ***  *** Sputum: ***  *** MRSA PCR: ***  Thank you for allowing pharmacy to be a part of this patient's care.  Fonda Hymen 07/05/2023 11:44 PM

## 2023-07-05 NOTE — ED Notes (Signed)
 Admitting provider at bedside.

## 2023-07-05 NOTE — ED Notes (Signed)
 Pt VT on the monitor. Radial pulse palpated. Dr. Drury Geralds notified immediately and arrived at bedside. Pt placed on zoll pads.

## 2023-07-05 NOTE — Consult Note (Incomplete)
 Cardiology Consultation:   Patient ID: Brandon Rubio. MRN: [id number]; DOB: 1950-09-15  Admit date: 07/05/2023 Date of Consult: 07/05/2023  Primary Care Provider: Adelia Homestead, MD [facility] Cardiologist: Dorothye Gathers, MD  [facility] Electrophysiologist:  None    Patient Profile:   Brandon Beh. is a 73 y.o. male with a hx of NSTEMI in 2009 and cath with 99% distal LAD treated medically (Dr. 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  History of Present Illness:   Brandon Rubio was brought in from home after being found by a friend lying on the couch soiled and confused.  Friend last saw the patient on Wednesday 4/23.  He was extremely confused.  EMS was called, and he was brought to the emergency department.  In the ED, he was found to be very altered, thought to have pneumonia and sepsis.  While in the ED, reportedly went into tachycardia to the 220s.  The patient was prepped for emergent cardioversion, however converted back to normal sinus rhythm immediately before DCCV.  The patient is on palliative chemotherapy for metastatic small non-small cell lung cancer.  He is  on docetaxel  and ramucirumab .  On my examination, the patient is completely unresponsive and unable to provide any history.  Past Medical History:  Diagnosis Date  . CAD (coronary artery disease) 10/2007   NSTEMI w/ dLAD occlusion >> med rx  . Cancer (HCC)    SKIN.Aaron AasBASAL CELL  . Cataract   . Diabetes mellitus    2  . DVT (deep venous thrombosis) (HCC) 03/30/2001   RLE DVT, post ankle fracture  . GERD (gastroesophageal reflux disease)   . Hyperlipidemia   . Hypertension   . Lung cancer (HCC) 12/10/2020  . 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 Peripheral neuropathy     toes    Past Surgical History:  Procedure Laterality Date  . BRONCHIAL BIOPSY  12/10/2020   Procedure: BRONCHIAL BIOPSIES;  Surgeon: Denson Flake, MD;  Location: [facility] ENDOSCOPY;  Service: Pulmonary;;  . BRONCHIAL BRUSHINGS  12/10/2020   Procedure: BRONCHIAL BRUSHINGS;  Surgeon: Denson Flake, MD;  Location: [facility] ENDOSCOPY;  Service: Pulmonary;;  . BRONCHIAL NEEDLE ASPIRATION BIOPSY  12/10/2020   Procedure: BRONCHIAL NEEDLE ASPIRATION BIOPSIES;  Surgeon: Denson Flake, MD;  Location: [facility] ENDOSCOPY;  Service: Pulmonary;;  . COLONOSCOPY    . COLONOSCOPY W/ POLYPECTOMY  03/10/2009  . LARYNGOSCOPY  03/10/1997  . VIDEO BRONCHOSCOPY WITH ENDOBRONCHIAL NAVIGATION N/A 12/10/2020   Procedure: ROBOTIC VIDEO BRONCHOSCOPY WITH ENDOBRONCHIAL NAVIGATION;  Surgeon: Denson Flake, MD;  Location: [facility] ENDOSCOPY;  Service: Pulmonary;  Laterality: N/A;     Home Medications:  Prior to Admission medications   Medication Sig Start Date End Date Taking? Authorizing Provider  aspirin  EC 81 MG tablet Take 81 mg by mouth in the morning. Swallow whole.   Yes [provider]  bisacodyl (DULCOLAX) 5 MG EC tablet Take 5 mg by mouth daily as needed for moderate constipation.   Yes [provider]  dexamethasone  (DECADRON ) 0.75 MG tablet Take 1 tablet (0.75 mg total) by mouth daily. 07/02/23  Yes Emilie Harden, MD  dexamethasone  (DECADRON ) 4 MG tablet 2 tablet p.o. twice daily the day before, day of and day after chemotherapy every 3 weeks 07/01/23  Yes Marlene Simas, MD  folic acid  (FOLVITE ) 1 MG tablet TAKE 1 TABLET BY MOUTH EVERY DAY 01/04/23  Yes Marlene Simas, MD  HYDROcodone -acetaminophen  (NORCO) 10-325 MG tablet Take 1 tablet by mouth every 12 (twelve) hours as needed. 06/18/23  Yes Adelia Homestead, MD  Magnesium  Gluconate 550 MG TABS Take 1 tablet (550 mg total) by mouth 2 (two) times daily. 11/03/22  Yes Adelia Homestead, MD  metFORMIN  (GLUCOPHAGE ) 1000 MG tablet TAKE 1  TABLET(1000 MG) BY MOUTH TWICE DAILY WITH A MEAL 06/22/23  Yes Adelia Homestead, MD  metoprolol  tartrate (LOPRESSOR ) 25 MG tablet Take 1 tablet (25 mg total) by mouth 2 (two) times daily. 04/30/23  Yes Hugh Madura, MD  Multiple Vitamins-Minerals (CENTRUM SILVER 50+MEN) TABS Take 1 tablet by mouth daily with breakfast.   Yes [provider]  omeprazole  (PRILOSEC) 20 MG capsule TAKE 1 CAPSULE BY MOUTH TWICE DAILY BEFORE A MEAL 02/17/23  Yes Marlene Simas, MD  ondansetron  (ZOFRAN ) 8 MG tablet Take 1 tablet (8 mg total) by mouth every 8 (eight) hours as needed for nausea or vomiting. 06/09/22  Yes Heilingoetter, [name], PA-C  prednisoLONE 5 MG TABS tablet Take 5 mg by mouth. Unknown Frequency.Aaron AasAaron AasDo not take on days when taking Decadron .   Yes [provider]  prochlorperazine  (COMPAZINE ) 10 MG tablet TAKE 1 TABLET(10 MG) BY MOUTH EVERY 6 HOURS FOR UP TO 14 DAYS AS NEEDED FOR NAUSEA OR VOMITING 12/03/22  Yes Heilingoetter, [name], PA-C  simvastatin  (ZOCOR ) 40 MG tablet TAKE 1 TABLET BY MOUTH EVERY EVENING AT 6 PM. Patient taking differently: Take 40 mg by mouth in the morning. 09/12/22  Yes Adelia Homestead, MD    Inpatient Medications: Scheduled Meds: . insulin  aspart  0-6 Units Subcutaneous Q4H   Continuous Infusions: . amiodarone  60 mg/hr (07/05/23 2133)   Followed by  . [START ON 07/06/2023] amiodarone     . [START ON 07/06/2023] aztreonam    . lactated ringers  150 mL/hr at 07/05/23 2042  . [START ON 07/06/2023] metronidazole    . vancomycin     PRN Meds:   Allergies:    Allergies  Allergen Reactions  . Penicillins Anaphylaxis, Swelling and Other (See Comments)    THE THROAT SWELLS!!!! Because of a history of documented adverse serious drug reaction;[name] Alert bracelet  is recommended  . Albumin  (Human) Hives and Itching  . Cyramza  [Ramucirumab ] Itching    Social History:   Social History   Socioeconomic History  . Marital status: Divorced     Spouse name: Not on file  . Number of children: 0  . Years of education: Not on file  . Highest education level: Not on file  Occupational History  . Occupation: Retired Personnel officer  Tobacco Use  . Smoking status: Every Day    Current packs/day: 1.50    Average packs/day: 1.5 packs/day for 58.0 years (87.0 ttl pk-yrs)    Types: Cigarettes  . Smokeless tobacco: Never  . Tobacco comments:    2+ packs smoked daily [facility] 12/05/20  Vaping Use  . Vaping status: Never Used  Substance and Sexual Activity  . Alcohol use: No    Alcohol/week: 0.0 standard drinks of alcohol    Comment:  none since 04/2012  . Drug use: Never  . Sexual activity: Not on file  Other Topics Concern  . Not on file  Social History Narrative   REG EXERCISE         Social Drivers of Health   Financial Resource Strain: Low  Risk  (09/15/2022)   Overall Financial Resource Strain (CARDIA)   . Difficulty of Paying Living Expenses: Not hard at all  Food Insecurity: No Food Insecurity (10/08/2022)   Hunger Vital Sign   . Worried About Programme researcher, broadcasting/film/video in the Last Year: Never true   . Ran Out of Food in the Last Year: Never true  Transportation Needs: No Transportation Needs (10/08/2022)   PRAPARE - Transportation   . Lack of Transportation (Medical): No   . Lack of Transportation (Non-Medical): No  Physical Activity: Inactive (09/15/2022)   Exercise Vital Sign   . Days of Exercise per Week: 0 days   . Minutes of Exercise per Session: 0 min  Stress: No Stress Concern Present (09/15/2022)   [employer] of Occupational Health - Occupational Stress Questionnaire   . Feeling of Stress : Not at all  Social Connections: Socially Isolated (09/15/2022)   Social Connection and Isolation Panel [NHANES]   . Frequency of Communication with Friends and Family: More than three times a week   . Frequency of Social Gatherings with Friends and Family: Three times a week   . Attends Religious Services: Never   . Active Member of  Clubs or Organizations: No   . Attends Banker Meetings: Never   . Marital Status: Divorced  Catering manager Violence: Not At Risk (10/02/2022)   Humiliation, Afraid, Rape, and Kick questionnaire   . Fear of Current or Ex-Partner: No   . Emotionally Abused: No   . Physically Abused: No   . Sexually Abused: No    Family History:    Family History  Problem Relation Age of Onset  . Lung cancer Mother        X 2  . Colon polyps Father   . Colon cancer Father 61  . Coronary artery disease Father   . Lung disease Father   . Esophageal cancer Neg Hx   . Stomach cancer Neg Hx   . Rectal cancer Neg Hx      ROS:  Review of systems unable to be obtained as patient is completely unresponsive on my examination Review of Systems: [y] = yes, [ ]  = no      General: Weight gain [ ] ; Weight loss [ ] ; Anorexia [ ] ; Fatigue [ ] ; Fever [ ] ; Chills [ ] ; Weakness [ ]    Cardiac: Chest pain/pressure [ ] ; Resting SOB [ ] ; Exertional SOB [ ] ; Orthopnea [ ] ; Pedal Edema [ ] ; Palpitations [ ] ; Syncope [ ] ; Presyncope [ ] ; Paroxysmal nocturnal dyspnea [ ]    Pulmonary: Cough [ ] ; Wheezing [ ] ; Hemoptysis [ ] ; Sputum [ ] ; Snoring [ ]    GI: Vomiting [ ] ; Dysphagia [ ] ; Melena [ ] ; Hematochezia [ ] ; Heartburn [ ] ; Abdominal pain [ ] ; Constipation [ ] ; Diarrhea [ ] ; BRBPR [ ]    GU: Hematuria [ ] ; Dysuria [ ] ; Nocturia [ ]  Vascular: Pain in legs with walking [ ] ; Pain in feet with lying flat [ ] ; Non-healing sores [ ] ; Stroke [ ] ; TIA [ ] ; Slurred speech [ ] ;   Neuro: Headaches [ ] ; Vertigo [ ] ; Seizures [ ] ; Paresthesias [ ] ;Blurred vision [ ] ; Diplopia [ ] ; Vision changes [ ]    Ortho/Skin: Arthritis [ ] ; Joint pain [ ] ; Muscle pain [ ] ; Joint swelling [ ] ; Back Pain [ ] ; Rash [ ]    Psych: Depression [ ] ; Anxiety [ ]    Heme: Bleeding problems [ ] ; Clotting disorders [ ] ;  Anemia [ ]    Endocrine: Diabetes [ ] ; Thyroid  dysfunction [ ]    Physical Exam/Data:   Vitals:   07/05/23 2049 07/05/23 2130  07/05/23 2200 07/05/23 2215  BP:  (!) 141/84 138/60   Pulse: (!) 235 (!) 122 (!) 119   Resp: (!) 23 18    Temp:      TempSrc:      SpO2: 93% 94%  94%  Weight:      Height:        Intake/Output Summary (Last 24 hours) at 07/05/2023 2354 Last data filed at 07/05/2023 1932 Gross per 24 hour  Intake 1169.58 ml  Output --  Net 1169.58 ml      07/05/2023    5:57 PM 07/01/2023    9:36 AM 06/29/2023    1:05 PM  Last 3 Weights  Weight (lbs) 200 lb 202 lb 1.6 oz 202 lb 3.2 oz  Weight (kg) 90.719 kg 91.672 kg 91.717 kg     Body mass index is 27.12 kg/m.  General: Appears critically ill sprawled out on emergency department bed Endocrine:  No thryomegaly Vascular: No carotid bruits; FA pulses 2+ bilaterally without bruits  Cardiac:  normal S1, S2; tachycardic, regular rate; no murmur  Lungs: Coarse breath sounds Abd: soft, no hepatomegaly  Ext: no edema Musculoskeletal:  No deformities, BUE and BLE strength normal and equal Skin: warm and dry  Neuro:  CNs 2-12 intact, no focal abnormalities noted Psych:  Normal affect   EKG:  The EKG was personally reviewed and demonstrates: Sinus tachycardia at 130 bpm with multiform premature ventricular contractions Telemetry:  Telemetry was personally reviewed and demonstrates: An episode of SVT versus VT at ventricular rate 230 bpm.  Only a single lead is available for review, but shows unchanged axis from his prior sinus beats, and is not quite as wide as PVCs on the telemetry strip before that.  Relevant CV Studies: 1. Left ventricular ejection fraction, by estimation, is 60 to 65%. The  left ventricle has normal function. The left ventricle has no regional  wall motion abnormalities. Left ventricular diastolic parameters are  indeterminate.   2. Right ventricular systolic function is normal. The right ventricular  size is normal.   3. The mitral valve is normal in structure. No evidence of mitral valve  regurgitation. No evidence of mitral  stenosis.   4. The aortic valve is normal in structure. Aortic valve regurgitation is  not visualized. No aortic stenosis is present.   5. There is mild dilatation of the ascending aorta, measuring 40 mm.  There is borderline dilatation of the aortic root, measuring 38 mm.   6. The inferior vena cava is normal in size with greater than 50%  respiratory variability, suggesting right atrial pressure of 3 mmHg.   Laboratory Data:  High Sensitivity Troponin:   Recent Labs  Lab 07/05/23 1744 07/05/23 2035  TROPONINIHS 42* 24*     Chemistry Recent Labs  Lab 07/01/23 0901 07/05/23 1744  NA 137 133*  K 4.5 3.9  CL 105 98  CO2 21* 23  GLUCOSE 438* 178*  BUN 19 15  CREATININE 1.00 1.07  CALCIUM 8.3* 8.6*  GFRNONAA >60 >60  ANIONGAP 11 12    Recent Labs  Lab 07/01/23 0901 07/05/23 1744  PROT 6.3* 6.3*  ALBUMIN  3.7 2.7*  AST 19 26  ALT 30 30  ALKPHOS 57 50  BILITOT 0.5 1.3*   Hematology Recent Labs  Lab 07/01/23 0901 07/05/23 1744  WBC 11.7*  5.1  RBC 3.92* 4.04*  HGB 12.1* 12.7*  HCT 36.4* 37.5*  MCV 92.9 92.8  MCH 30.9 31.4  MCHC 33.2 33.9  RDW 16.2* 15.7*  PLT 125* 74*   BNPNo results for input(s): "BNP", "PROBNP" in the last 168 hours.  DDimer No results for input(s): "DDIMER" in the last 168 hours.  Radiology/Studies:  CT Head Wo Contrast Result Date: 07/05/2023 CLINICAL DATA:  Mental status change, unknown cause EXAM: CT HEAD WITHOUT CONTRAST TECHNIQUE: Contiguous axial images were obtained from the base of the skull through the vertex without intravenous contrast. RADIATION DOSE REDUCTION: This exam was performed according to the departmental dose-optimization program which includes automated exposure control, adjustment of the mA and/or kV according to patient size and/or use of iterative reconstruction technique. COMPARISON:  05/10/2021. FINDINGS: Brain: There is periventricular white matter decreased attenuation consistent with small vessel ischemic  changes. Ventricles, sulci and cisterns are prominent consistent with age related involutional changes. No acute intracranial hemorrhage, mass effect or shift. No hydrocephalus. Vascular: No hyperdense vessel or unexpected calcification. Skull: Normal. Negative for fracture or focal lesion. Sinuses/Orbits: No acute finding. IMPRESSION: Atrophy and chronic small vessel ischemic changes. No acute intracranial process identified. Electronically Signed   By: Sydell Eva M.D.   On: 07/05/2023 19:58   DG Chest Portable 1 View Result Date: 07/05/2023 CLINICAL DATA:  Altered mental status.  Found down. EXAM: PORTABLE CHEST 1 VIEW COMPARISON:  10/02/2022. FINDINGS: Dense consolidation at the left base is somewhat mass-like appearing. The this could represent pneumonia and follow up is recommended to confirm resolution. There is linear subsegmental atelectasis or scarring in the right perihilar region. There is density consistent with consolidation or volume loss in the medial right base. No pneumothorax. No definite pleural effusion. Normal pulmonary vasculature. IMPRESSION: Bibasilar opacities, left greater than right. Follow-up recommended to confirm resolution. Electronically Signed   By: Sydell Eva M.D.   On: 07/05/2023 19:56     Assessment and Plan:   Regenald Maturo. is a 73 y.o. male with a hx of NSTEMI in 2009 and cath with 99% distal LAD treated medically (Dr. 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  SVT vs VT Presenting with pneumonia and sepsis along with critical illness and unresponsiveness.  In the emergency department developed tachycardia with ventricular rate 230 bpm.  We do not have an EKG from this time, and telemetry only has 1-lead.  That 1-lead shows a QRS  axis that is unchanged from his prior sinus beats, and that is slightly less wide than his prior PVCs he has a history of SVT. CAD s/ hx of NSTEMI - Continue home aspirin  and statin HTN HLD  {Are we signing off today?:[id number]}  For questions or updates, please contact [facility] Please consult [email or web address] for contact info under     Signed, Chauncey Cora, MD  07/05/2023 11:54 PM

## 2023-07-05 NOTE — H&P (Incomplete)
 History and Physical    Brandon Rubio. ZOX:[id number] DOB: 30-Mar-1950 DOA: 07/05/2023  Patient coming from: Home.  Chief Complaint: Confusion and weakness.   History obtained from patient's granddaughter.  Patient is confused.  HPI: Brandon Rubio. is a 73 y.o. male with history of metastatic non-small cell lung cancer being followed by oncologist Dr. Marguerita Shih last chemotherapy on 07/01/2023 was found to be confused minimally responsive and disheveled on his couch by his friend and was brought to the ER.  As per patient's granddaughter patient was doing fine and has been talking with him on the phone except for today when he did not respond to the phone call.  But since his last chemotherapy patient did indicate that he was getting more weaker and more sleepier.  Today because he did not answer his phone call friends went to check on him and found to be confused lying on the couch and had feces all over him.  Patient's granddaughter thinks that he may not have taken his medicines including steroids which he takes for adrenal insufficiency last few days.  ED Course: In the ER patient appears confused febrile with temperature 102 F with chest x-ray showing possible infiltrates concerning for pneumonia.  Patient was tachycardic with elevated lactic acid which improved with fluid bolus.  Labs show sodium 133 hemoglobin 12.7 platelets 74 troponins 42.24 creatinine 1.  CT head is unremarkable.  COVID and flu test were negative.  Patient started on empiric antibiotics for sepsis.  While in the ER patient became tachycardic with heart rate going up to the 220s there was some concern for SVT versus ventricular tachycardia and patient was started on amiodarone  infusion after discussing with cardiologist.  At the time of my exam patient only oriented to his name.  Patient had diarrhea in the ER.  Stool studies have been ordered.  Review of Systems: As per HPI, rest all negative.   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 ENDOSCOPY;  Service: Pulmonary;;   BRONCHIAL BRUSHINGS  12/10/2020   Procedure: BRONCHIAL BRUSHINGS;  Surgeon: Denson Flake, MD;  Location: [facility] ENDOSCOPY;  Service: Pulmonary;;   BRONCHIAL NEEDLE ASPIRATION BIOPSY  12/10/2020   Procedure: BRONCHIAL NEEDLE ASPIRATION BIOPSIES;  Surgeon: Denson Flake, MD;  Location: [facility] ENDOSCOPY;  Service: Pulmonary;;   COLONOSCOPY     COLONOSCOPY W/ POLYPECTOMY  03/10/2009   LARYNGOSCOPY  03/10/1997   VIDEO BRONCHOSCOPY WITH ENDOBRONCHIAL NAVIGATION N/A 12/10/2020   Procedure: ROBOTIC VIDEO BRONCHOSCOPY WITH ENDOBRONCHIAL NAVIGATION;  Surgeon: Denson Flake, MD;  Location: [facility] ENDOSCOPY;  Service: Pulmonary;  Laterality: N/A;     reports that he has been smoking cigarettes. He has a 87 pack-year smoking history. He has never used smokeless tobacco. He reports that he does not drink alcohol and does not use drugs.  Allergies  Allergen Reactions   Penicillins Anaphylaxis, Swelling and Other (See Comments)    THE THROAT SWELLS!!!! Because of a history of documented adverse serious drug reaction;[name] Alert bracelet  is recommended   Albumin  (Human) Hives and Itching   Cyramza  [Ramucirumab ] Itching    Family History  Problem Relation Age of Onset   Lung cancer Mother        X 2   Colon polyps Father    Colon cancer Father 35   Coronary artery disease  Father    Lung disease Father    Esophageal cancer Neg Hx    Stomach cancer Neg Hx    Rectal cancer Neg Hx     Prior to Admission medications   Medication Sig Start Date End Date Taking? Authorizing Provider  aspirin  EC 81 MG tablet Take 81 mg by mouth in the morning. Swallow whole.   Yes [provider]  bisacodyl (DULCOLAX) 5 MG EC tablet Take 5 mg by mouth daily as needed for moderate constipation.   Yes [provider]  dexamethasone  (DECADRON ) 0.75 MG tablet Take 1 tablet (0.75 mg total) by mouth daily. 07/02/23  Yes Emilie Harden, MD  dexamethasone  (DECADRON ) 4 MG tablet 2 tablet p.o. twice daily the day before, day of and day after chemotherapy every 3 weeks 07/01/23  Yes Marlene Simas, MD  folic acid  (FOLVITE ) 1 MG tablet TAKE 1 TABLET BY MOUTH EVERY DAY 01/04/23  Yes Marlene Simas, MD  HYDROcodone -acetaminophen  (NORCO) 10-325 MG tablet Take 1 tablet by mouth every 12 (twelve) hours as needed. 06/18/23  Yes Adelia Homestead, MD  Magnesium  Gluconate 550 MG TABS Take 1 tablet (550 mg total) by mouth 2 (two) times daily. 11/03/22  Yes Adelia Homestead, MD  metFORMIN  (GLUCOPHAGE ) 1000 MG tablet TAKE 1 TABLET(1000 MG) BY MOUTH TWICE DAILY WITH A MEAL 06/22/23  Yes Adelia Homestead, MD  metoprolol  tartrate (LOPRESSOR ) 25 MG tablet Take 1 tablet (25 mg total) by mouth 2 (two) times daily. 04/30/23  Yes Hugh Madura, MD  Multiple Vitamins-Minerals (CENTRUM SILVER 50+MEN) TABS Take 1 tablet by mouth daily with breakfast.   Yes [provider]  omeprazole  (PRILOSEC) 20 MG capsule TAKE 1 CAPSULE BY MOUTH TWICE DAILY BEFORE A MEAL 02/17/23  Yes Marlene Simas, MD  ondansetron  (ZOFRAN ) 8 MG tablet Take 1 tablet (8 mg total) by mouth every 8 (eight) hours as needed for nausea or vomiting. 06/09/22  Yes Heilingoetter, [name], PA-C  prednisoLONE 5 MG TABS tablet Take 5 mg by mouth. Unknown Frequency.Aaron AasAaron AasDo not take on days when taking Decadron .   Yes  [provider]  prochlorperazine  (COMPAZINE ) 10 MG tablet TAKE 1 TABLET(10 MG) BY MOUTH EVERY 6 HOURS FOR UP TO 14 DAYS AS NEEDED FOR NAUSEA OR VOMITING 12/03/22  Yes Heilingoetter, [name], PA-C  simvastatin  (ZOCOR ) 40 MG tablet TAKE 1 TABLET BY MOUTH EVERY EVENING AT 6 PM. Patient taking differently: Take 40 mg by mouth in the morning. 09/12/22  Yes Adelia Homestead, MD    Physical Exam: Constitutional: Moderately built and nourished. Vitals:   07/05/23 2049 07/05/23 2130 07/05/23 2200 07/05/23 2215  BP:  (!) 141/84 138/60   Pulse: (!) 235 (!) 122 (!) 119   Resp: (!) 23 18    Temp:      TempSrc:      SpO2: 93% 94%  94%  Weight:      Height:       Eyes: Anicteric no pallor. ENMT: No discharge  from the ears eyes nose and mouth. Neck: No mass felt.  No neck rigidity. Respiratory: No rhonchi or crepitations. Cardiovascular: S1 S2 heard. Abdomen: Soft nontender bowel sound present. Musculoskeletal: No edema. Skin: No rash. Neurologic: Appears confused following commands pupils are reactive to light. Psychiatric: Appears confused.   Labs on Admission: I have personally reviewed following labs and imaging studies  CBC: Recent Labs  Lab 07/01/23 0901 07/05/23 1744  WBC 11.7* 5.1  NEUTROABS 10.5* 3.4  HGB 12.1* 12.7*  HCT 36.4* 37.5*  MCV 92.9 92.8  PLT 125* 74*   Basic Metabolic Panel: Recent Labs  Lab 07/01/23 0901 07/05/23 1744  NA 137 133*  K 4.5 3.9  CL 105 98  CO2 21* 23  GLUCOSE 438* 178*  BUN 19 15  CREATININE 1.00 1.07  CALCIUM 8.3* 8.6*   GFR: Estimated Creatinine Clearance: 65.5 mL/min (by C-G formula based on SCr of 1.07 mg/dL). Liver Function Tests: Recent Labs  Lab 07/01/23 0901 07/05/23 1744  AST 19 26  ALT 30 30  ALKPHOS 57 50  BILITOT 0.5 1.3*  PROT 6.3* 6.3*  ALBUMIN  3.7 2.7*   No results for input(s): "LIPASE", "AMYLASE" in the last 168 hours. No results for input(s): "AMMONIA" in the last 168 hours. Coagulation  Profile: Recent Labs  Lab 07/05/23 1744  INR 1.1   Cardiac Enzymes: Recent Labs  Lab 07/05/23 2035  CKTOTAL 39*   BNP (last 3 results) No results for input(s): "PROBNP" in the last 8760 hours. HbA1C: No results for input(s): "HGBA1C" in the last 72 hours. CBG: No results for input(s): "GLUCAP" in the last 168 hours. Lipid Profile: No results for input(s): "CHOL", "HDL", "LDLCALC", "TRIG", "CHOLHDL", "LDLDIRECT" in the last 72 hours. Thyroid  Function Tests: No results for input(s): "TSH", "T4TOTAL", "FREET4", "T3FREE", "THYROIDAB" in the last 72 hours. Anemia Panel: No results for input(s): "VITAMINB12", "FOLATE", "FERRITIN", "TIBC", "IRON", "RETICCTPCT" in the last 72 hours. Urine analysis:    Component Value Date/Time   COLORURINE YELLOW 10/02/2022 1716   APPEARANCEUR CLEAR 10/02/2022 1716   LABSPEC 1.019 10/02/2022 1716   PHURINE 5.0 10/02/2022 1716   GLUCOSEU NEGATIVE 10/02/2022 1716   HGBUR NEGATIVE 10/02/2022 1716   BILIRUBINUR NEGATIVE 10/02/2022 1716   KETONESUR 80 (A) 10/02/2022 1716   PROTEINUR NEGATIVE 05/20/2023 1100   UROBILINOGEN 0.2 12/11/2007 2313   NITRITE NEGATIVE 10/02/2022 1716   LEUKOCYTESUR NEGATIVE 10/02/2022 1716   Sepsis Labs: @LABRCNTIP (procalcitonin:4,lacticidven:4) ) Recent Results (from the past 240 hours)  Resp panel by RT-PCR (RSV, Flu A&B, Covid)     Status: None   Collection Time: 07/05/23  5:49 PM   Specimen: Nasal Swab  Result Value Ref Range Status   SARS Coronavirus 2 by RT PCR NEGATIVE NEGATIVE Final   Influenza A by PCR NEGATIVE NEGATIVE Final   Influenza B by PCR NEGATIVE NEGATIVE Final    Comment: (NOTE) The Xpert Xpress SARS-CoV-2/FLU/RSV plus assay is intended as an aid in the diagnosis of influenza from Nasopharyngeal swab specimens and should not be used as a sole basis for treatment. Nasal washings and aspirates are unacceptable for Xpert Xpress SARS-CoV-2/FLU/RSV testing.  Fact Sheet for  Patients: BloggerCourse.com  Fact Sheet for Healthcare Providers: SeriousBroker.it  This test is not yet approved or cleared by the [place]  FDA and has been authorized for detection and/or diagnosis of SARS-CoV-2 by FDA under an Emergency Use Authorization (EUA). This EUA will remain in effect (meaning this test can be used) for the duration of the COVID-19 declaration  under Section 564(b)(1) of the Act, 21 U.S.C. section 360bbb-3(b)(1), unless the authorization is terminated or revoked.     Resp Syncytial Virus by PCR NEGATIVE NEGATIVE Final    Comment: (NOTE) Fact Sheet for Patients: BloggerCourse.com  Fact Sheet for Healthcare Providers: SeriousBroker.it  This test is not yet approved or cleared by the [place]  FDA and has been authorized for detection and/or diagnosis of SARS-CoV-2 by FDA under an Emergency Use Authorization (EUA). This EUA will remain in effect (meaning this test can be used) for the duration of the COVID-19 declaration under Section 564(b)(1) of the Act, 21 U.S.C. section 360bbb-3(b)(1), unless the authorization is terminated or revoked.  Performed at [facility], [address], [place], [place] [address]   C Difficile Quick Screen w PCR reflex     Status: None   Collection Time: 07/05/23  9:18 PM   Specimen: STOOL  Result Value Ref Range Status   C Diff antigen NEGATIVE NEGATIVE Final   C Diff toxin NEGATIVE NEGATIVE Final   C Diff interpretation No C. difficile detected.  Final    Comment: Performed at [facility], [address], [place], [place] [address]     Radiological Exams on Admission: CT Head Wo Contrast Result Date: 07/05/2023 CLINICAL DATA:  Mental status change, unknown cause EXAM: CT HEAD WITHOUT CONTRAST TECHNIQUE: Contiguous axial images were obtained from the base of the skull through the vertex without  intravenous contrast. RADIATION DOSE REDUCTION: This exam was performed according to the departmental dose-optimization program which includes automated exposure control, adjustment of the mA and/or kV according to patient size and/or use of iterative reconstruction technique. COMPARISON:  05/10/2021. FINDINGS: Brain: There is periventricular white matter decreased attenuation consistent with small vessel ischemic changes. Ventricles, sulci and cisterns are prominent consistent with age related involutional changes. No acute intracranial hemorrhage, mass effect or shift. No hydrocephalus. Vascular: No hyperdense vessel or unexpected calcification. Skull: Normal. Negative for fracture or focal lesion. Sinuses/Orbits: No acute finding. IMPRESSION: Atrophy and chronic small vessel ischemic changes. No acute intracranial process identified. Electronically Signed   By: Sydell Eva M.D.   On: 07/05/2023 19:58   DG Chest Portable 1 View Result Date: 07/05/2023 CLINICAL DATA:  Altered mental status.  Found down. EXAM: PORTABLE CHEST 1 VIEW COMPARISON:  10/02/2022. FINDINGS: Dense consolidation at the left base is somewhat mass-like appearing. The this could represent pneumonia and follow up is recommended to confirm resolution. There is linear subsegmental atelectasis or scarring in the right perihilar region. There is density consistent with consolidation or volume loss in the medial right base. No pneumothorax. No definite pleural effusion. Normal pulmonary vasculature. IMPRESSION: Bibasilar opacities, left greater than right. Follow-up recommended to confirm resolution. Electronically Signed   By: Sydell Eva M.D.   On: 07/05/2023 19:56    EKG: Independently reviewed.  Sinus tachycardia.  Assessment/Plan Principal Problem:   Sepsis (HCC) Active Problems:   Diabetes mellitus type 2 with complications (HCC)   Essential hypertension   CAD (coronary artery disease)   Adenocarcinoma of right lung,  stage 3 (HCC)   Anemia, unspecified   Adrenal insufficiency (HCC)   Thrombocytopenia (HCC)   Acute encephalopathy    Sepsis unknown source could be from pneumonia -   will continue with empiric antibiotics vancomycin Azactam and Flagyl.  Follow cultures.  Will get a stat CT chest abdomen pelvis to clearly see the source.  Check urinalysis.  Continue with hydration. Acute encephalopathy likely from sepsis.  CT head unremarkable.  Will continue to closely monitor.  Will keep patient n.p.o. until patient is more alert awake.   SVT versus V. tach appreciate cardiology consult.  Presently on amiodarone  infusion. Diabetes mellitus type 2 takes metformin  at home presently on sliding scale coverage.  Last hemoglobin A1c was 9.2  1-week ago. History of adrenal insufficiency with history of not being compliant will keep patient on stress dose steroids for now. Anemia and thrombocytopenia appears to be chronic could be related to chemotherapy.  Closely monitor for any further worsening will need hemolytic workup. History of CAD follow cardiology consult. Diarrhea while in the ER patient had diarrhea.  Check stool studies. Metastatic lung cancer being followed by oncologist Dr. Marguerita Shih.  Last chemotherapy was about 5 days ago.  Since patient has sepsis will need close monitoring and more than 2 midnight stay.   DVT prophylaxis: SCDs.  Avoiding anticoagulation in the setting of thrombocytopenia. Code Status: Full code as confirmed with patient's granddaughter. Family Communication: Patient's granddaughter. Disposition Plan: Stepdown. Consults called: Cardiology. Admission status: Inpatient.

## 2023-07-05 NOTE — ED Triage Notes (Signed)
 PT BIB [facility] from home after friend found PT lying on bed soiled. Friend last saw well on Wed the 23rd. PT normally Aox4, currently altered alert to self and situation only. Hx of lung cancer. [facility] vitals:  130/84, HR 130, Temp 102, O2 97% on 2L, RR 40

## 2023-07-05 NOTE — ED Notes (Signed)
 Pt transported to CT via stretcher by this RN. Pt remained on cardiac monitor.

## 2023-07-06 ENCOUNTER — Inpatient Hospital Stay (HOSPITAL_COMMUNITY)

## 2023-07-06 DIAGNOSIS — G9340 Encephalopathy, unspecified: Secondary | ICD-10-CM | POA: Diagnosis not present

## 2023-07-06 DIAGNOSIS — E118 Type 2 diabetes mellitus with unspecified complications: Secondary | ICD-10-CM | POA: Diagnosis not present

## 2023-07-06 DIAGNOSIS — D696 Thrombocytopenia, unspecified: Secondary | ICD-10-CM

## 2023-07-06 DIAGNOSIS — I471 Supraventricular tachycardia, unspecified: Secondary | ICD-10-CM | POA: Diagnosis not present

## 2023-07-06 DIAGNOSIS — I10 Essential (primary) hypertension: Secondary | ICD-10-CM

## 2023-07-06 DIAGNOSIS — A419 Sepsis, unspecified organism: Secondary | ICD-10-CM | POA: Diagnosis not present

## 2023-07-06 DIAGNOSIS — R6520 Severe sepsis without septic shock: Secondary | ICD-10-CM

## 2023-07-06 DIAGNOSIS — C3491 Malignant neoplasm of unspecified part of right bronchus or lung: Secondary | ICD-10-CM

## 2023-07-06 DIAGNOSIS — G9341 Metabolic encephalopathy: Secondary | ICD-10-CM

## 2023-07-06 DIAGNOSIS — E2749 Other adrenocortical insufficiency: Secondary | ICD-10-CM

## 2023-07-06 LAB — COMPREHENSIVE METABOLIC PANEL WITH GFR
ALT: 25 U/L (ref 0–44)
AST: 22 U/L (ref 15–41)
Albumin: 2.2 g/dL — ABNORMAL LOW (ref 3.5–5.0)
Alkaline Phosphatase: 43 U/L (ref 38–126)
Anion gap: 12 (ref 5–15)
BUN: 13 mg/dL (ref 8–23)
CO2: 18 mmol/L — ABNORMAL LOW (ref 22–32)
Calcium: 8.1 mg/dL — ABNORMAL LOW (ref 8.9–10.3)
Chloride: 103 mmol/L (ref 98–111)
Creatinine, Ser: 0.89 mg/dL (ref 0.61–1.24)
GFR, Estimated: >60 mL/min (ref >60)
Glucose, Bld: 232 mg/dL — ABNORMAL HIGH (ref 70–99)
Potassium: 4.3 mmol/L (ref 3.5–5.1)
Sodium: 133 mmol/L — ABNORMAL LOW (ref 135–145)
Total Bilirubin: 1.2 mg/dL (ref 0.0–1.2)
Total Protein: 5.1 g/dL — ABNORMAL LOW (ref 6.5–8.1)

## 2023-07-06 LAB — GLUCOSE, CAPILLARY
Glucose-Capillary: 168 mg/dL — ABNORMAL HIGH (ref 70–99)
Glucose-Capillary: 205 mg/dL — ABNORMAL HIGH (ref 70–99)
Glucose-Capillary: 220 mg/dL — ABNORMAL HIGH (ref 70–99)
Glucose-Capillary: 223 mg/dL — ABNORMAL HIGH (ref 70–99)
Glucose-Capillary: 247 mg/dL — ABNORMAL HIGH (ref 70–99)

## 2023-07-06 LAB — URINALYSIS, ROUTINE W REFLEX MICROSCOPIC
Bilirubin Urine: NEGATIVE
Glucose, UA: >=500 mg/dL — AB
Ketones, ur: 20 mg/dL — AB
Leukocytes,Ua: NEGATIVE
Nitrite: NEGATIVE
Protein, ur: NEGATIVE mg/dL
Specific Gravity, Urine: 1.011 (ref 1.005–1.030)
pH: 5 (ref 5.0–8.0)

## 2023-07-06 LAB — CBC WITH DIFFERENTIAL/PLATELET
Abs Immature Granulocytes: 0.13 10*3/uL — ABNORMAL HIGH (ref 0.00–0.07)
Basophils Absolute: 0.1 10*3/uL (ref 0.0–0.1)
Basophils Relative: 3 %
Eosinophils Absolute: 0 10*3/uL (ref 0.0–0.5)
Eosinophils Relative: 1 %
HCT: 32.9 % — ABNORMAL LOW (ref 39.0–52.0)
Hemoglobin: 11.1 g/dL — ABNORMAL LOW (ref 13.0–17.0)
Immature Granulocytes: 7 %
Lymphocytes Relative: 18 %
Lymphs Abs: 0.3 10*3/uL — ABNORMAL LOW (ref 0.7–4.0)
MCH: 31.1 pg (ref 26.0–34.0)
MCHC: 33.7 g/dL (ref 30.0–36.0)
MCV: 92.2 fL (ref 80.0–100.0)
Monocytes Absolute: 0.1 10*3/uL (ref 0.1–1.0)
Monocytes Relative: 3 %
Neutro Abs: 1.3 10*3/uL — ABNORMAL LOW (ref 1.7–7.7)
Neutrophils Relative %: 68 %
Platelets: UNDETERMINED 10*3/uL (ref 150–400)
RBC: 3.57 MIL/uL — ABNORMAL LOW (ref 4.22–5.81)
RDW: 15.5 % (ref 11.5–15.5)
WBC: 1.9 10*3/uL — ABNORMAL LOW (ref 4.0–10.5)
nRBC: 0 % (ref 0.0–0.2)

## 2023-07-06 LAB — RAPID URINE DRUG SCREEN, HOSP PERFORMED
Amphetamines: NOT DETECTED
Barbiturates: NOT DETECTED
Benzodiazepines: NOT DETECTED
Cocaine: NOT DETECTED
Opiates: NOT DETECTED
Tetrahydrocannabinol: NOT DETECTED

## 2023-07-06 LAB — MAGNESIUM: Magnesium: 1 mg/dL — ABNORMAL LOW (ref 1.7–2.4)

## 2023-07-06 LAB — CBG MONITORING, ED: Glucose-Capillary: 198 mg/dL — ABNORMAL HIGH (ref 70–99)

## 2023-07-06 LAB — MRSA NEXT GEN BY PCR, NASAL: MRSA by PCR Next Gen: NOT DETECTED

## 2023-07-06 LAB — APTT: aPTT: 34 s (ref 24–36)

## 2023-07-06 MED ORDER — HYDROCORTISONE SOD SUC (PF) 100 MG IJ SOLR
75.0000 mg | Freq: Two times a day (BID) | INTRAMUSCULAR | Status: DC
  Administered 2023-07-06 – 2023-07-07 (×3): 75 mg via INTRAVENOUS
  Filled 2023-07-06 (×4): qty 1.5

## 2023-07-06 MED ORDER — MAGNESIUM SULFATE 2 GM/50ML IV SOLN
2.0000 g | Freq: Once | INTRAVENOUS | Status: AC
  Administered 2023-07-06: 2 g via INTRAVENOUS
  Filled 2023-07-06: qty 50

## 2023-07-06 MED ORDER — IOHEXOL 350 MG/ML SOLN
75.0000 mL | Freq: Once | INTRAVENOUS | Status: AC | PRN
  Administered 2023-07-06: 75 mL via INTRAVENOUS

## 2023-07-06 MED ORDER — METOPROLOL TARTRATE 25 MG PO TABS
25.0000 mg | ORAL_TABLET | Freq: Two times a day (BID) | ORAL | Status: DC
  Administered 2023-07-06 – 2023-07-12 (×13): 25 mg via ORAL
  Filled 2023-07-06 (×13): qty 1

## 2023-07-06 MED ORDER — VANCOMYCIN HCL 2000 MG/400ML IV SOLN
2000.0000 mg | INTRAVENOUS | Status: DC
  Administered 2023-07-06: 2000 mg via INTRAVENOUS
  Filled 2023-07-06: qty 400

## 2023-07-06 MED ORDER — OXYCODONE-ACETAMINOPHEN 5-325 MG PO TABS
1.0000 | ORAL_TABLET | Freq: Four times a day (QID) | ORAL | Status: DC | PRN
  Administered 2023-07-06 – 2023-07-08 (×6): 1 via ORAL
  Filled 2023-07-06 (×6): qty 1

## 2023-07-06 NOTE — Plan of Care (Signed)
  Problem: Fluid Volume: Goal: Ability to maintain a balanced intake and output will improve Outcome: Progressing   Problem: Nutritional: Goal: Maintenance of adequate nutrition will improve Outcome: Progressing   Problem: Activity: Goal: Risk for activity intolerance will decrease Outcome: Progressing   Problem: Coping: Goal: Level of anxiety will decrease Outcome: Progressing

## 2023-07-06 NOTE — ED Notes (Signed)
 ED TO INPATIENT HANDOFF REPORT  ED Nurse Name and Phone #: Nerissa Bannister [phone]   S Name/Age/Gender Brandon Rubio. 73 y.o. male Room/Bed: 007C/007C  Code Status   Code Status: Full Code  Home/SNF/Other Home Patient oriented to: self Is this baseline? No   Triage Complete: Triage complete  Chief Complaint Sepsis (HCC) [A41.9]  Triage Note PT BIB [facility] from home after friend found PT lying on bed soiled. Friend last saw well on Wed the 23rd. PT normally Aox4, currently altered alert to self and situation only. Hx of lung cancer. [facility] vitals:  130/84, HR 130, Temp 102, O2 97% on 2L, RR 40   Allergies Allergies  Allergen Reactions   Penicillins Anaphylaxis, Swelling and Other (See Comments)    THE THROAT SWELLS!!!! Because of a history of documented adverse serious drug reaction;[name] Alert bracelet  is recommended   Albumin  (Human) Hives and Itching   Cyramza  [Ramucirumab ] Itching    Level of Care/Admitting Diagnosis ED Disposition     ED Disposition  Admit   Condition  --   Comment  Hospital Area: [facility] [[id number]]  Level of Care: Progressive [102]  Admit to Progressive based on following criteria: MULTISYSTEM THREATS such as stable sepsis, metabolic/electrolyte imbalance with or without encephalopathy that is responding to early treatment.  May admit patient to [facility] or [facility] if equivalent level of care is available:: No  Covid Evaluation: Asymptomatic - no recent exposure (last 10 days) testing not required  Diagnosis: Sepsis [facility]) [[id number]]  Admitting Physician: Angelene Kelly [phone]  Attending Physician: Angelene Kelly [phone]  Certification:: I certify this patient will need inpatient services for at least 2 midnights  Expected Medical Readiness: 07/07/2023          B Medical/Surgery History Past Medical History:  Diagnosis Date   CAD (coronary artery disease) 10/2007   NSTEMI w/ dLAD occlusion >> med  rx   Cancer (HCC)    SKIN.Aaron AasBASAL CELL   Cataract    Diabetes mellitus    2   DVT (deep venous thrombosis) (HCC) 03/30/2001   RLE DVT, post ankle fracture   GERD (gastroesophageal reflux disease)    Hyperlipidemia    Hypertension    Lung cancer (HCC) 12/10/2020   Myocardial infarct, 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   Peripheral neuropathy    toes   Past Surgical History:  Procedure Laterality Date   BRONCHIAL BIOPSY  12/10/2020   Procedure: BRONCHIAL BIOPSIES;  Surgeon: Denson Flake, MD;  Location: [facility] ENDOSCOPY;  Service: Pulmonary;;   BRONCHIAL BRUSHINGS  12/10/2020   Procedure: BRONCHIAL BRUSHINGS;  Surgeon: Denson Flake, MD;  Location: [facility] ENDOSCOPY;  Service: Pulmonary;;   BRONCHIAL NEEDLE ASPIRATION BIOPSY  12/10/2020   Procedure: BRONCHIAL NEEDLE ASPIRATION BIOPSIES;  Surgeon: Denson Flake, MD;  Location: [facility] ENDOSCOPY;  Service: Pulmonary;;   COLONOSCOPY     COLONOSCOPY W/ POLYPECTOMY  03/10/2009   LARYNGOSCOPY  03/10/1997   VIDEO BRONCHOSCOPY WITH ENDOBRONCHIAL NAVIGATION N/A 12/10/2020   Procedure: ROBOTIC VIDEO BRONCHOSCOPY WITH ENDOBRONCHIAL NAVIGATION;  Surgeon: Denson Flake, MD;  Location: [facility] ENDOSCOPY;  Service: Pulmonary;  Laterality: N/A;     A IV Location/Drains/Wounds Patient Lines/Drains/Airways Status     Active Line/Drains/Airways     Name Placement date Placement time Site Days   Peripheral IV 07/05/23 20 G Left Antecubital 07/05/23  1722  Antecubital  1   Peripheral IV 07/05/23 20 G Right Antecubital 07/05/23  1751  Antecubital  1  Intake/Output Last 24 hours  Intake/Output Summary (Last 24 hours) at 07/06/2023 0042 Last data filed at 07/05/2023 1932 Gross per 24 hour  Intake 1169.58 ml  Output --  Net 1169.58 ml    Labs/Imaging Results for orders placed or performed during the hospital encounter of 07/05/23 (from the past 48 hours)  Comprehensive metabolic panel     Status: Abnormal   Collection Time: 07/05/23  5:44 PM  Result Value  Ref Range   Sodium 133 (L) 135 - 145 mmol/L   Potassium 3.9 3.5 - 5.1 mmol/L   Chloride 98 98 - 111 mmol/L   CO2 23 22 - 32 mmol/L   Glucose, Bld 178 (H) 70 - 99 mg/dL    Comment: Glucose reference range applies only to samples taken after fasting for at least 8 hours.   BUN 15 8 - 23 mg/dL   Creatinine, Ser 1.61 0.61 - 1.24 mg/dL   Calcium 8.6 (L) 8.9 - 10.3 mg/dL   Total Protein 6.3 (L) 6.5 - 8.1 g/dL   Albumin  2.7 (L) 3.5 - 5.0 g/dL   AST 26 15 - 41 U/L   ALT 30 0 - 44 U/L   Alkaline Phosphatase 50 38 - 126 U/L   Total Bilirubin 1.3 (H) 0.0 - 1.2 mg/dL   GFR, Estimated >09 >60 mL/min    Comment: (NOTE) Calculated using the CKD-EPI Creatinine Equation (2021)    Anion gap 12 5 - 15    Comment: Performed at [facility], [address], [place], [place] [address]  CBC with Differential     Status: Abnormal   Collection Time: 07/05/23  5:44 PM  Result Value Ref Range   WBC 5.1 4.0 - 10.5 K/uL   RBC 4.04 (L) 4.22 - 5.81 MIL/uL   Hemoglobin 12.7 (L) 13.0 - 17.0 g/dL   HCT 81.1 (L) 91.4 - 78.2 %   MCV 92.8 80.0 - 100.0 fL   MCH 31.4 26.0 - 34.0 pg   MCHC 33.9 30.0 - 36.0 g/dL   RDW 95.6 (H) 21.3 - 08.6 %   Platelets 74 (L) 150 - 400 K/uL    Comment: SPECIMEN CHECKED FOR CLOTS Immature Platelet Fraction may be clinically indicated, consider ordering this additional test [id number] REPEATED TO VERIFY PLATELET COUNT CONFIRMED BY SMEAR    nRBC 0.0 0.0 - 0.2 %   Neutrophils Relative % 67 %   Neutro Abs 3.4 1.7 - 7.7 K/uL   Lymphocytes Relative 29 %   Lymphs Abs 1.5 0.7 - 4.0 K/uL   Monocytes Relative 1 %   Monocytes Absolute 0.1 0.1 - 1.0 K/uL   Eosinophils Relative 0 %   Eosinophils Absolute 0.0 0.0 - 0.5 K/uL   Basophils Relative 1 %   Basophils Absolute 0.1 0.0 - 0.1 K/uL   WBC Morphology MORPHOLOGY UNREMARKABLE    RBC Morphology MORPHOLOGY UNREMARKABLE    Smear Review PLATELETS APPEAR DECREASED    Immature Granulocytes 2 %   Abs Immature Granulocytes 0.12 (H)  0.00 - 0.07 K/uL    Comment: Performed at [facility], [address] [place], [place] [address]  Troponin I (High Sensitivity)     Status: Abnormal   Collection Time: 07/05/23  5:44 PM  Result Value Ref Range   Troponin I (High Sensitivity) 42 (H) <18 ng/L    Comment: (NOTE) Elevated high sensitivity troponin I (hsTnI) values and significant  changes across serial measurements may suggest ACS but many other  chronic and acute  conditions are known to elevate hsTnI results.  Refer to the "Links" section for chest pain algorithms and additional  guidance. Performed at [facility], [address] [place], [place] [address]   Ethanol     Status: None   Collection Time: 07/05/23  5:44 PM  Result Value Ref Range   Alcohol, Ethyl (B) <15 <15 mg/dL    Comment: Please note change in reference range. (NOTE) For medical purposes only. Performed at [facility], [address] [place], [place] [address]   Protime-INR     Status: None   Collection Time: 07/05/23  5:44 PM  Result Value Ref Range   Prothrombin Time 14.3 11.4 - 15.2 seconds   INR 1.1 0.8 - 1.2    Comment: (NOTE) INR goal varies based on device and disease states. Performed at [facility], [address] [place], [place] [address]   Resp panel by RT-PCR (RSV, Flu A&B, Covid)     Status: None   Collection Time: 07/05/23  5:49 PM   Specimen: Nasal Swab  Result Value Ref Range   SARS Coronavirus 2 by RT PCR NEGATIVE NEGATIVE   Influenza A by PCR NEGATIVE NEGATIVE   Influenza B by PCR NEGATIVE NEGATIVE    Comment: (NOTE) The Xpert Xpress SARS-CoV-2/FLU/RSV plus assay is intended as an aid in the diagnosis of influenza from Nasopharyngeal swab specimens and should not be used as a sole basis for treatment. Nasal washings and aspirates are unacceptable for Xpert Xpress SARS-CoV-2/FLU/RSV testing.  Fact Sheet for Patients: BloggerCourse.com  Fact Sheet for Healthcare  Providers: SeriousBroker.it  This test is not yet approved or cleared by the [place]  FDA and has been authorized for detection and/or diagnosis of SARS-CoV-2 by FDA under an Emergency Use Authorization (EUA). This EUA will remain in effect (meaning this test can be used) for the duration of the COVID-19 declaration under Section 564(b)(1) of the Act, 21 U.S.C. section 360bbb-3(b)(1), unless the authorization is terminated or revoked.     Resp Syncytial Virus by PCR NEGATIVE NEGATIVE    Comment: (NOTE) Fact Sheet for Patients: BloggerCourse.com  Fact Sheet for Healthcare Providers: SeriousBroker.it  This test is not yet approved or cleared by the [place]  FDA and has been authorized for detection and/or diagnosis of SARS-CoV-2 by FDA under an Emergency Use Authorization (EUA). This EUA will remain in effect (meaning this test can be used) for the duration of the COVID-19 declaration under Section 564(b)(1) of the Act, 21 U.S.C. section 360bbb-3(b)(1), unless the authorization is terminated or revoked.  Performed at [facility], [address], [place], [place] [address]   I-Stat Lactic Acid, ED     Status: Abnormal   Collection Time: 07/05/23  5:55 PM  Result Value Ref Range   Lactic Acid, Venous 2.5 (HH) 0.5 - 1.9 mmol/L   Comment MD NOTIFIED, SUGGEST RECOLLECT   CK     Status: Abnormal   Collection Time: 07/05/23  8:35 PM  Result Value Ref Range   Total CK 39 (L) 49 - 397 U/L    Comment: Performed at [facility], [address] [place], [place] [address]  Troponin I (High Sensitivity)     Status: Abnormal   Collection Time: 07/05/23  8:35 PM  Result Value Ref Range   Troponin I (High Sensitivity) 24 (H) <18 ng/L    Comment: (NOTE) Elevated high sensitivity troponin I (hsTnI) values and significant  changes across serial  measurements may suggest ACS but many other   chronic and acute conditions are known to elevate hsTnI results.  Refer to the "Links" section for chest pain algorithms and additional  guidance. Performed at [facility], [address] [place], [place] [address]   I-Stat Lactic Acid, ED     Status: None   Collection Time: 07/05/23  8:45 PM  Result Value Ref Range   Lactic Acid, Venous 0.7 0.5 - 1.9 mmol/L  C Difficile Quick Screen w PCR reflex     Status: None   Collection Time: 07/05/23  9:18 PM   Specimen: STOOL  Result Value Ref Range   C Diff antigen NEGATIVE NEGATIVE   C Diff toxin NEGATIVE NEGATIVE   C Diff interpretation No C. difficile detected.     Comment: Performed at [facility], [address] [place], [place] [address]  POC CBG, ED     Status: Abnormal   Collection Time: 07/06/23 12:31 AM  Result Value Ref Range   Glucose-Capillary 198 (H) 70 - 99 mg/dL    Comment: Glucose reference range applies only to samples taken after fasting for at least 8 hours.   CT Head Wo Contrast Result Date: 07/05/2023 CLINICAL DATA:  Mental status change, unknown cause EXAM: CT HEAD WITHOUT CONTRAST TECHNIQUE: Contiguous axial images were obtained from the base of the skull through the vertex without intravenous contrast. RADIATION DOSE REDUCTION: This exam was performed according to the departmental dose-optimization program which includes automated exposure control, adjustment of the mA and/or kV according to patient size and/or use of iterative reconstruction technique. COMPARISON:  05/10/2021. FINDINGS: Brain: There is periventricular white matter decreased attenuation consistent with small vessel ischemic changes. Ventricles, sulci and cisterns are prominent consistent with age related involutional changes. No acute intracranial hemorrhage, mass effect or shift. No hydrocephalus. Vascular: No hyperdense vessel or unexpected calcification. Skull: Normal. Negative for fracture or focal lesion. Sinuses/Orbits: No acute  finding. IMPRESSION: Atrophy and chronic small vessel ischemic changes. No acute intracranial process identified. Electronically Signed   By: Sydell Eva M.D.   On: 07/05/2023 19:58   DG Chest Portable 1 View Result Date: 07/05/2023 CLINICAL DATA:  Altered mental status.  Found down. EXAM: PORTABLE CHEST 1 VIEW COMPARISON:  10/02/2022. FINDINGS: Dense consolidation at the left base is somewhat mass-like appearing. The this could represent pneumonia and follow up is recommended to confirm resolution. There is linear subsegmental atelectasis or scarring in the right perihilar region. There is density consistent with consolidation or volume loss in the medial right base. No pneumothorax. No definite pleural effusion. Normal pulmonary vasculature. IMPRESSION: Bibasilar opacities, left greater than right. Follow-up recommended to confirm resolution. Electronically Signed   By: Sydell Eva M.D.   On: 07/05/2023 19:56    Pending Labs Unresulted Labs (From admission, onward)     Start     Ordered   07/06/23 0500  Comprehensive metabolic panel  Tomorrow morning,   R        07/05/23 2323   07/06/23 0500  CBC with Differential/Platelet  Tomorrow morning,   R        07/05/23 2323   07/06/23 0011  MRSA Next Gen by PCR, Nasal  (MRSA Screening)  Once,   R        07/06/23 0010   07/05/23 2326  APTT  ONCE - STAT,   STAT        07/05/23 2326   07/05/23 2109  Magnesium   Add-on,  AD        07/05/23 2108   07/05/23 2109  Calcium, ionized  Once,   STAT        07/05/23 2108   07/05/23 1746  Culture, blood (Routine x 2)  BLOOD CULTURE X 2,   R      07/05/23 1746   07/05/23 1746  Urinalysis, w/ Reflex to Culture (Infection Suspected) -Urine, Clean Catch  Once,   URGENT       Question:  Specimen Source  Answer:  Urine, Clean Catch   07/05/23 1746   07/05/23 1735  Rapid urine drug screen (hospital performed)  ONCE - STAT,   STAT        07/05/23 1734            Vitals/Pain Today's Vitals    07/05/23 2130 07/05/23 2200 07/05/23 2215 07/06/23 0030  BP: (!) 141/84 138/60  126/74  Pulse: (!) 122 (!) 119  (!) 107  Resp: 18   (!) 22  Temp:    100.2 F (37.9 C)  TempSrc:    Rectal  SpO2: 94%  94% 97%  Weight:      Height:      PainSc:        Isolation Precautions Enteric precautions (UV disinfection)  Medications Medications  lactated ringers  infusion ( Intravenous New Bag/Given 07/05/23 2042)  amiodarone  (NEXTERONE ) 1.8 mg/mL load via infusion 150 mg (150 mg Intravenous Bolus from Bag 07/05/23 2110)    Followed by  amiodarone  (NEXTERONE  PREMIX) 360-4.14 MG/200ML-% (1.8 mg/mL) IV infusion (60 mg/hr Intravenous New Bag/Given 07/06/23 0033)    Followed by  amiodarone  (NEXTERONE  PREMIX) 360-4.14 MG/200ML-% (1.8 mg/mL) IV infusion (has no administration in time range)  insulin  aspart (novoLOG ) injection 0-6 Units (1 Units Subcutaneous Given 07/06/23 0041)  aztreonam (AZACTAM) 2 g in sodium chloride  0.9 % 100 mL IVPB (has no administration in time range)  metroNIDAZOLE (FLAGYL) IVPB 500 mg (has no administration in time range)  vancomycin (VANCOREADY) IVPB 2000 mg/400 mL (2,000 mg Intravenous New Bag/Given 07/06/23 0037)  vancomycin (VANCOREADY) IVPB 2000 mg/400 mL (has no administration in time range)  aztreonam (AZACTAM) 2 g in sodium chloride  0.9 % 100 mL IVPB (0 g Intravenous Stopped 07/05/23 1846)  metroNIDAZOLE (FLAGYL) IVPB 500 mg (0 mg Intravenous Stopped 07/05/23 1932)  lactated ringers  bolus 1,000 mL (0 mLs Intravenous Stopped 07/05/23 1855)  acetaminophen  (TYLENOL ) suppository 650 mg (650 mg Rectal Given 07/05/23 2210)  hydrocortisone  sodium succinate  (SOLU-CORTEF ) 100 MG injection 100 mg (100 mg Intravenous Given 07/05/23 2252)    Mobility Unable to ambulate at this time. Normally no assist.     Focused Assessments Pulmonary Assessment Handoff:  Lung sounds: L Breath Sounds: Diminished R Breath Sounds: Fine crackles O2 Device: Room Air      R Recommendations:  See Admitting Provider Note  Report given to:   Additional Notes: Granddaughter at bedside.

## 2023-07-06 NOTE — Progress Notes (Signed)
 Progress Note   Patient: Brandon Rubio:[id number] DOB: 02-04-1951 DOA: 07/05/2023     1 DOS: the patient was seen and examined on 07/06/2023   Brief hospital course: Brandon Rubio. is a 73 y.o. male with history of type 2 diabetes, hypertension, hyperlipidemia, adrenal insufficiency, SVT, metastatic non-small cell lung cancer being followed by oncologist Dr. Marguerita Shih last chemotherapy on 07/01/2023 was found to be confused minimally responsive and disheveled on his couch by his friend and was brought to the ER.  Per his granddaughter, since his last chemotherapy patient did indicate that he was getting more weaker and more sleepier. Question whether he missed his steroid doses he takes for adrenal insufficiency.   Patient had fever of 102, tachycardic, elevated lactic acid.  Chest x-ray with possible infiltrates admitted to hospitalist service for sepsis secondary to pneumonia, started broad-spectrum antibiotic therapy.  Assessment and Plan: Sepsis Possible bilateral pneumonia Patient will be continued on broad-spectrum antibiotics vancomycin, Azactam and Flagyl. Fever, tachycardia, lactic acid improved. CT chest/ abdomen/ pelvis ordered for source of infection. Continue gentle IV fluids. Follow blood cultures.  Acute encephalopathy Due to sepsis. He is back to baseline mental status. Continue neuro checks. SLP evaluation before starting diet. Fall, aspiration precautions.  SVT In the setting of sepsis. Cardiology on board, continue amiodarone  gtt. Follow further recommendations. Correct electrolytes.  Hypomagnesemia- IV mag supplements ordered. Diet with SLP evaluation.  Hyponatremia-  Due to high blood sugars. Corrected sodium normal  History of adrenal insufficiency: Patient takes oral steroids at home.  Due to his sickness, sepsis will give stress dose steroids Solu-Cortef  75 twice daily.  Pancytopenia- Related to chemotherapy. If his counts further decrease  will get oncology involved, He does get Neulasta  with chemo.  Metastatic non small cell lung cancer- Follows oncologist, last chemo on 07/01/23. Continue outpatient follow up with Dr. Marguerita Shih.  Uncontrolled type 2 diabetes mellitus: A1c 9.2.  Possibly due to use of steroids. Continue Accu-Cheks, sliding scale insulin .  Debility Generalized weakness PT/ OT evaluation for discharge needs.      Out of bed to chair. Incentive spirometry. Nursing supportive care. Fall, aspiration precautions. Diet:  Diet Orders (From admission, onward)    None      DVT prophylaxis: SCDs Start: 07/05/23 2323  Level of care: Progressive   Code Status: Full Code  Subjective: Patient is seen and examined today morning.  Patient is able to answer me appropriately. He denies any complaints of nausea vomiting or abdominal discomfort.  Admits being weak.  Granddaughter at bedside.  Physical Exam: Vitals:   07/06/23 0520 07/06/23 0600 07/06/23 0800 07/06/23 1100  BP: 118/74 (!) 148/83 123/70 (!) 130/93  Pulse: 90 98 93 87  Resp: (!) 29 19 13  (!) 21  Temp: (!) 97.1 F (36.2 C)  97.8 F (36.6 C) 98.2 F (36.8 C)  TempSrc: Axillary  Oral Oral  SpO2: 97% 98% 97% 97%  Weight:      Height:        General - Elderly Caucasian male, no apparent distress HEENT - PERRLA, EOMI, atraumatic head, non tender sinuses. Lung - Clear, basal rales, rhonchi, no wheezes. Heart - S1, S2 heard, no murmurs, rubs, trace pedal edema. Abdomen - Soft, non tender, bowel sounds good Neuro - Alert, awake and oriented x 3, non focal exam. Skin - Warm and dry.  Data Reviewed:      Latest Ref Rng & Units 07/06/2023    3:20 AM 07/05/2023    5:44 PM 07/01/2023  9:01 AM  CBC  WBC 4.0 - 10.5 K/uL 1.9  5.1  11.7   Hemoglobin 13.0 - 17.0 g/dL 96.0  45.4  09.8   Hematocrit 39.0 - 52.0 % 32.9  37.5  36.4   Platelets 150 - 400 K/uL PLATELET CLUMPS NOTED ON SMEAR, UNABLE TO ESTIMATE  74  125       Latest Ref Rng & Units  07/06/2023    3:20 AM 07/05/2023    5:44 PM 07/01/2023    9:01 AM  BMP  Glucose 70 - 99 mg/dL 119  147  829   BUN 8 - 23 mg/dL 13  15  19    Creatinine 0.61 - 1.24 mg/dL 5.62  1.30  8.65   Sodium 135 - 145 mmol/L 133  133  137   Potassium 3.5 - 5.1 mmol/L 4.3  3.9  4.5   Chloride 98 - 111 mmol/L 103  98  105   CO2 22 - 32 mmol/L 18  23  21    Calcium 8.9 - 10.3 mg/dL 8.1  8.6  8.3    CT CHEST ABDOMEN PELVIS WO CONTRAST Result Date: 07/06/2023 EXAM: CT CHEST, ABDOMEN AND PELVIS WITHOUT CONTRAST 07/06/2023 01:19:14 AM TECHNIQUE: CT of the chest, abdomen and pelvis was performed Multiplanar reformatted images are provided for review. Automated exposure control, iterative reconstruction, and/or weight based adjustment of the mA/kV was utilized to reduce the radiation dose to as low as reasonably achievable. COMPARISON: 06/03/2023 CLINICAL HISTORY: Pneumonia, complication suspected, xray done; Abdominal pain, acute, nonlocalized. History of lung cancer. FINDINGS: MEDIASTINUM: Mild 3-vessel coronary atherosclerosis. Mild atherosclerotic calcification of the aortic arch. LYMPH NODES: No evidence of mediastinal, hilar or axillary lymphadenopathy. LUNGS AND PLEURA: Radiation changes/scarring in the right lower lobe with suspected superimposed aspiration, chronic. Scattered multifocal ground glass nodules/masses, measuring up to 5.7 cm in the left lower lobe (image 72), grossly unchanged and compatible with known multifocal adenocarcinoma. Mild centrilobular and paraseptal emphysematous changes, upper lung predominant. Trace right pleural effusion, chronic. HEPATOBILIARY: The liver is unremarkable. Gallbladder is unremarkable. No biliary ductal dilatation. SPLEEN: No acute abnormality. PANCREAS: No acute abnormality. ADRENAL GLANDS: No acute abnormality. KIDNEYS, URETERS AND BLADDER: 3.6 cm simple left renal cyst (image 53), benign, no follow-up is recommended. No stones in the kidneys or ureters. No evidence of  hydronephrosis. No evidence of perinephric or periureteral stranding. GI AND BOWEL: Stomach demonstrates no acute abnormality. There is no evidence of bowel obstruction. No evidence of appendicitis. REPRODUCTIVE: Reproductive organs are unremarkable. PERITONEUM AND RETROPERITONEUM: No evidence of ascites or free air. Atherosclerotic calcifications of the abdominal aorta and branch vessels. LYMPH NODES: No evidence of lymphadenopathy. BONES AND SOFT TISSUES: No acute osseous abnormality. No focal soft tissue abnormality. Incidental adrenal and/or renal findings do not require follow up imaging. IMPRESSION: 1. Stable multifocal adenocarcinoma. 2. Radiation changes/scarring in the right lower lobe with suspected superimposed aspiration. 3. Trace right pleural effusion, chronic. 4. No acute findings in the abdomen/pelvis. Electronically signed by: Zadie Herter MD 07/06/2023 01:31 AM EDT RP Workstation: [id number]   CT Head Wo Contrast Result Date: 07/05/2023 CLINICAL DATA:  Mental status change, unknown cause EXAM: CT HEAD WITHOUT CONTRAST TECHNIQUE: Contiguous axial images were obtained from the base of the skull through the vertex without intravenous contrast. RADIATION DOSE REDUCTION: This exam was performed according to the departmental dose-optimization program which includes automated exposure control, adjustment of the mA and/or kV according to patient size and/or use of iterative reconstruction technique. COMPARISON:  05/10/2021. FINDINGS: Brain:  There is periventricular white matter decreased attenuation consistent with small vessel ischemic changes. Ventricles, sulci and cisterns are prominent consistent with age related involutional changes. No acute intracranial hemorrhage, mass effect or shift. No hydrocephalus. Vascular: No hyperdense vessel or unexpected calcification. Skull: Normal. Negative for fracture or focal lesion. Sinuses/Orbits: No acute finding. IMPRESSION: Atrophy and chronic small  vessel ischemic changes. No acute intracranial process identified. Electronically Signed   By: Sydell Eva M.D.   On: 07/05/2023 19:58   DG Chest Portable 1 View Result Date: 07/05/2023 CLINICAL DATA:  Altered mental status.  Found down. EXAM: PORTABLE CHEST 1 VIEW COMPARISON:  10/02/2022. FINDINGS: Dense consolidation at the left base is somewhat mass-like appearing. The this could represent pneumonia and follow up is recommended to confirm resolution. There is linear subsegmental atelectasis or scarring in the right perihilar region. There is density consistent with consolidation or volume loss in the medial right base. No pneumothorax. No definite pleural effusion. Normal pulmonary vasculature. IMPRESSION: Bibasilar opacities, left greater than right. Follow-up recommended to confirm resolution. Electronically Signed   By: Sydell Eva M.D.   On: 07/05/2023 19:56    Family Communication: Discussed with patient, grand daughter at bedside. They understand and agree. All questions answered.  Disposition: Status is: Inpatient Remains inpatient appropriate because: Sepsis, IV antibiotics, IV fluids.  Planned Discharge Destination: Home with Home Health     MDM level 3- Patient is admitted for sepsis, he is immunocompromised due to chemo, on broad spectrum antibiotics. He has electrolyte abnormalities, pancytopenia, high sugars. He is at risk for sudden clinical deterioration.  Author: Aisha Hove, MD 07/06/2023 11:09 AM Secure chat 7am to 7pm For on call review [email or web address].

## 2023-07-06 NOTE — Progress Notes (Addendum)
 As below, patient seen and examined.  Patient denies chest pain or dyspnea.  Mental status is much improved.  I do not have the strip from last evening to review but by report felt possibly secondary to SVT.  Will discontinue IV amiodarone  and resume metoprolol  25 mg twice daily.  Follow on telemetry.  Will repeat echocardiogram. Brandon Angel, MD        Patient Name: Brandon Rubio. Date of Encounter: 07/06/2023 [facility] Cardiologist: Brandon Gathers, MD    Interval Summary  .    Patient doing much better when compared to yesterday  He does not have any events from yesterday  Family present in room, express concerns about patient living alone -- primary MD notified  Currently on room air with SpO2 92-94% Denies any current symptoms aside from weakness  Vital Signs .    Vitals:   07/06/23 0300 07/06/23 0520 07/06/23 0600 07/06/23 0800  BP: 115/78 118/74 (!) 148/83 123/70  Pulse: 93 90 98 93  Resp: (!) 25 (!) 29 19 13   Temp:  (!) 97.1 F (36.2 C)  97.8 F (36.6 C)  TempSrc:  Axillary  Oral  SpO2: 92% 97% 98% 97%  Weight:      Height:       Intake/Output Summary (Last 24 hours) at 07/06/2023 0946 Last data filed at 07/06/2023 0353 Gross per 24 hour  Intake 2589.25 ml  Output 450 ml  Net 2139.25 ml      07/05/2023    5:57 PM 07/01/2023    9:36 AM 06/29/2023    1:05 PM  Last 3 Weights  Weight (lbs) 200 lb 202 lb 1.6 oz 202 lb 3.2 oz  Weight (kg) 90.719 kg 91.672 kg 91.717 kg      Telemetry/ECG    Sinus rhythm, HR 90s, frequent PVCs - Personally Reviewed  Physical Exam .   GEN: No acute distress, alert and oriented, on room air, family in the room.   Neck: No JVD Cardiac: RRR, no murmurs, rubs, or gallops.  Respiratory: Clear to auscultation bilaterally. GI: Soft, nontender, non-distended  MS: No edema  Assessment & Plan .   Brandon Rubio. is a 72 y.o. male with a hx of NSTEMI in 2009 and cath with 99% distal LAD treated medically,  recurrent metastatic NSCLC, adenocarcinoma on palliative chemo, current tobacco use, type 2 diabetes, HTN, HLD, adrenal insufficiency on chronic prednisone  who is being seen today for the evaluation of VT vs SVT in the setting of sepsis secondary to PNA  Episode of SVT vs VT History of SVT Presented to ED via EMS with PNA and sepsis, appeared critically ill and unresponsive  Was found by friend at home, laying on his couch, soiled and confused  Reportedly developed tachycardia with HR 220-230s There are no EKGs from this time and only a single lead telemetry strip Seen by fellow last night, suspected to be SVT due to known past history, and single lead telemetry having unchanged QRS axis from prior beats Given amiodarone  due to unstable state  Likely that arrhythmia was triggered by acute illness  Converted back to sinus in the ED right before undergoing cardioversion  Patient doing much better today, has no memory of yesterday  Most recent HR 90s Currently in sinus rhythm  Currently IV amiodarone  until patient remains stable to avoid further episodes  Previously on Lopressor  25 mg BID at home, can restart once stable and off amiodarone  -- likely later today, will discuss with  MD Continue to monitor telemetry  Treatment of underlying sepsis per primary team   Deconditioning Weakness  Patient currently live at home, alone with his dog  Undergoing chemotherapy for his lung cancer His family expresses concerns about his well being  They are requesting some sort of home health PT/OT be ordered at discharge to ensure that he has someone working with him at home if that's where he is to discharge to  They also express concerns about possible need for home oxygen, once patient is stable from walking standpoint can do an ambulatory test to determine need for oxygen requirement  Above to be arranged by primary team, primary MD has been notified   CAD s/p NSTEMI 2009 Hyperlipidemia  Elevated  troponin levels  Troponin levels 42 > 24 Likely in the setting of sepsis  No acute changes noted on EKG  06/29/2023: HDL 62; LDL Cholesterol (Calc) 82 1/61/0960: ALT 25  Home meds: ASA 81 mg daily, simvastatin  40 mg daily -- can restart when stable   Per primary PNA Sepsis  Acute encephalopathy, resolved  Diabetes Adrenal insufficiency Anemia Thrombocytopenia  Metastatic lung cancer  Electrolyte disorders    For questions or updates, please contact [facility] Please consult [email or web address] for contact info under     Signed, Jiles Mote, PA-C

## 2023-07-06 NOTE — ED Notes (Signed)
 Pt transported to CT and 4E via stretcher by this RN. Pt remained on cardiac monitor and pulse ox. Accompanied by granddaughter. No acute distress noted.

## 2023-07-06 NOTE — Evaluation (Signed)
 Clinical/Bedside Swallow Evaluation Patient Details  Name: Brandon Rubio. MRN: [id number] Date of Birth: [birth year]  Today's Date: 07/06/2023 Time: SLP Start Time (ACUTE ONLY): 1130 SLP Stop Time (ACUTE ONLY): 1150 SLP Time Calculation (min) (ACUTE ONLY): 20 min  Past Medical History:  Past Medical History:  Diagnosis Date   CAD (coronary artery disease) 10/2007   NSTEMI w/ dLAD occlusion >> med rx   Cancer (HCC)    SKIN.Aaron AasBASAL CELL   Cataract    Diabetes mellitus    2   DVT (deep venous thrombosis) (HCC) 03/30/2001   RLE DVT, post ankle fracture   GERD (gastroesophageal reflux disease)    Hyperlipidemia    Hypertension    Lung cancer (HCC) 12/10/2020   Myocardial infarct, 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   Peripheral neuropathy    toes   Past Surgical History:  Past Surgical History:  Procedure Laterality Date   BRONCHIAL BIOPSY  12/10/2020   Procedure: BRONCHIAL BIOPSIES;  Surgeon: Denson Flake, MD;  Location: [facility] ENDOSCOPY;  Service: Pulmonary;;   BRONCHIAL BRUSHINGS  12/10/2020   Procedure: BRONCHIAL BRUSHINGS;  Surgeon: Denson Flake, MD;  Location: [facility] ENDOSCOPY;  Service: Pulmonary;;   BRONCHIAL NEEDLE ASPIRATION BIOPSY  12/10/2020   Procedure: BRONCHIAL NEEDLE ASPIRATION BIOPSIES;  Surgeon: Denson Flake, MD;  Location: [facility] ENDOSCOPY;  Service: Pulmonary;;   COLONOSCOPY     COLONOSCOPY W/ POLYPECTOMY  03/10/2009   LARYNGOSCOPY  03/10/1997   VIDEO BRONCHOSCOPY WITH ENDOBRONCHIAL NAVIGATION N/A 12/10/2020   Procedure: ROBOTIC VIDEO BRONCHOSCOPY WITH ENDOBRONCHIAL NAVIGATION;  Surgeon: Denson Flake, MD;  Location: [facility] ENDOSCOPY;  Service: Pulmonary;  Laterality: N/A;   HPI:  73yo male admitted 07/05/23 with confusion and weakness. PMH: metastatic non-small cell lung cancer (last chemo 07/01/23), CAD, NSTEMI, cataracts, DM2, HLT, HTN, peripheral neuropathy, tobacco. CXR: BLL opacties L>R. CTHead: atrophy and chronic small vessel ischemic changes, no acute process.     Assessment / Plan / Recommendation  Clinical Impression  Pt presents with functional oropharyngeal swallow, as best can be determined at bedside. CN exam WFL, no reported history of dysphagia prior to admit. Pt accepted trials of thin liquid, puree, and solid textures. No obvious oral issues or overt s/s aspiration, including 3oz water challenge. Recommend regular diet/thin liquids, meds as tolerated. RN/MD informed of results/recommendations. ST signing off at this time. Please reconsult if needs arise.  SLP Visit Diagnosis: Dysphagia, unspecified (R13.10)    Aspiration Risk  Mild aspiration risk    Diet Recommendation Thin liquid;Regular    Liquid Administration via: Cup;Straw Medication Administration: Whole meds with liquid Supervision: Patient able to self feed Compensations: Minimize environmental distractions;Slow rate;Small sips/bites Postural Changes: Seated upright at 90 degrees    Other  Recommendations Oral Care Recommendations: Oral care BID    Recommendations for follow up therapy are one component of a multi-disciplinary discharge planning process, led by the attending physician.  Recommendations may be updated based on patient status, additional functional criteria and insurance authorization.  Follow up Recommendations No SLP follow up      Functional Status Assessment Patient has not had a recent decline in their functional status      Prognosis Prognosis for improved oropharyngeal function: Good      Swallow Study   General Date of Onset: 07/05/23 HPI: 73yo male admitted 07/05/23 with confusion and weakness. PMH: metastatic non-small cell lung cancer (last chemo 07/01/23), CAD, NSTEMI, cataracts, DM2, HLT, HTN, peripheral neuropathy, tobacco. CXR: BLL opacties L>R. CTHead: atrophy and chronic small vessel ischemic  changes, no acute process. Type of Study: Bedside Swallow Evaluation Previous Swallow Assessment: none Diet Prior to this Study: NPO Temperature  Spikes Noted: Yes (102.2 on admit) Respiratory Status: Nasal cannula History of Recent Intubation: No Behavior/Cognition: Alert;Cooperative;Pleasant mood Oral Cavity Assessment: Within Functional Limits Oral Care Completed by SLP: No Oral Cavity - Dentition: Adequate natural dentition Vision: Functional for self-feeding Self-Feeding Abilities: Able to feed self Patient Positioning: Upright in bed Baseline Vocal Quality: Normal Volitional Cough: Strong Volitional Swallow: Able to elicit    Oral/Motor/Sensory Function Overall Oral Motor/Sensory Function: Within functional limits   Ice Chips Ice chips: Not tested   Thin Liquid Thin Liquid: Within functional limits Presentation: Cup;Straw;Self Fed    Nectar Thick Nectar Thick Liquid: Not tested   Honey Thick Honey Thick Liquid: Not tested   Puree Puree: Within functional limits Presentation: Spoon   Solid     Solid: Within functional limits Presentation: Self Fed     Kathy Wares B. Arby Beam, [facility], CCC-SLP Speech Language Pathologist Office: [phone]  Adine Ahmadi 07/06/2023,11:55 AM

## 2023-07-07 ENCOUNTER — Inpatient Hospital Stay (HOSPITAL_COMMUNITY)

## 2023-07-07 ENCOUNTER — Telehealth: Payer: Self-pay

## 2023-07-07 DIAGNOSIS — R0609 Other forms of dyspnea: Secondary | ICD-10-CM | POA: Diagnosis not present

## 2023-07-07 DIAGNOSIS — I471 Supraventricular tachycardia, unspecified: Secondary | ICD-10-CM | POA: Diagnosis not present

## 2023-07-07 DIAGNOSIS — G9340 Encephalopathy, unspecified: Secondary | ICD-10-CM | POA: Diagnosis not present

## 2023-07-07 DIAGNOSIS — E118 Type 2 diabetes mellitus with unspecified complications: Secondary | ICD-10-CM | POA: Diagnosis not present

## 2023-07-07 DIAGNOSIS — C3491 Malignant neoplasm of unspecified part of right bronchus or lung: Secondary | ICD-10-CM | POA: Diagnosis not present

## 2023-07-07 DIAGNOSIS — A419 Sepsis, unspecified organism: Secondary | ICD-10-CM | POA: Diagnosis not present

## 2023-07-07 LAB — GLUCOSE, CAPILLARY
Glucose-Capillary: 180 mg/dL — ABNORMAL HIGH (ref 70–99)
Glucose-Capillary: 266 mg/dL — ABNORMAL HIGH (ref 70–99)
Glucose-Capillary: 220 mg/dL — ABNORMAL HIGH (ref 70–99)
Glucose-Capillary: 327 mg/dL — ABNORMAL HIGH (ref 70–99)
Glucose-Capillary: 334 mg/dL — ABNORMAL HIGH (ref 70–99)

## 2023-07-07 LAB — CBC WITH DIFFERENTIAL/PLATELET
Abs Immature Granulocytes: 0.07 10*3/uL (ref 0.00–0.07)
Basophils Absolute: 0 10*3/uL (ref 0.0–0.1)
Basophils Relative: 3 %
Eosinophils Absolute: 0 10*3/uL (ref 0.0–0.5)
Eosinophils Relative: 2 %
HCT: 32.5 % — ABNORMAL LOW (ref 39.0–52.0)
Hemoglobin: 10.8 g/dL — ABNORMAL LOW (ref 13.0–17.0)
Immature Granulocytes: 6 %
Lymphocytes Relative: 22 %
Lymphs Abs: 0.3 10*3/uL — ABNORMAL LOW (ref 0.7–4.0)
MCH: 30.6 pg (ref 26.0–34.0)
MCHC: 33.2 g/dL (ref 30.0–36.0)
MCV: 92.1 fL (ref 80.0–100.0)
Monocytes Absolute: 0.1 10*3/uL (ref 0.1–1.0)
Monocytes Relative: 5 %
Neutro Abs: 0.7 10*3/uL — ABNORMAL LOW (ref 1.7–7.7)
Neutrophils Relative %: 62 %
Platelets: 58 10*3/uL — ABNORMAL LOW (ref 150–400)
RBC: 3.53 MIL/uL — ABNORMAL LOW (ref 4.22–5.81)
RDW: 15.8 % — ABNORMAL HIGH (ref 11.5–15.5)
WBC: 1.2 10*3/uL — CL (ref 4.0–10.5)
nRBC: 0 % (ref 0.0–0.2)

## 2023-07-07 LAB — BASIC METABOLIC PANEL WITH GFR
Anion gap: 9 (ref 5–15)
BUN: 21 mg/dL (ref 8–23)
CO2: 20 mmol/L — ABNORMAL LOW (ref 22–32)
Calcium: 8.1 mg/dL — ABNORMAL LOW (ref 8.9–10.3)
Chloride: 110 mmol/L (ref 98–111)
Creatinine, Ser: 0.98 mg/dL (ref 0.61–1.24)
GFR, Estimated: >60 mL/min (ref >60)
Glucose, Bld: 185 mg/dL — ABNORMAL HIGH (ref 70–99)
Potassium: 3.8 mmol/L (ref 3.5–5.1)
Sodium: 139 mmol/L (ref 135–145)

## 2023-07-07 LAB — CBC
HCT: 32 % — ABNORMAL LOW (ref 39.0–52.0)
Hemoglobin: 10.9 g/dL — ABNORMAL LOW (ref 13.0–17.0)
MCH: 30.8 pg (ref 26.0–34.0)
MCHC: 34.1 g/dL (ref 30.0–36.0)
MCV: 90.4 fL (ref 80.0–100.0)
Platelets: 64 10*3/uL — ABNORMAL LOW (ref 150–400)
RBC: 3.54 MIL/uL — ABNORMAL LOW (ref 4.22–5.81)
RDW: 15.7 % — ABNORMAL HIGH (ref 11.5–15.5)
WBC: 1.1 10*3/uL — CL (ref 4.0–10.5)
nRBC: 0 % (ref 0.0–0.2)

## 2023-07-07 LAB — ECHOCARDIOGRAM COMPLETE
Area-P 1/2: 3.42 cm2
Height: 72 in
S' Lateral: 3.2 cm
Weight: 3200 [oz_av]

## 2023-07-07 LAB — CALCIUM, IONIZED: Calcium, Ionized, Serum: 4.8 mg/dL (ref 4.5–5.6)

## 2023-07-07 LAB — MAGNESIUM: Magnesium: 1.6 mg/dL — ABNORMAL LOW (ref 1.7–2.4)

## 2023-07-07 MED ORDER — MAGNESIUM OXIDE -MG SUPPLEMENT 400 (240 MG) MG PO TABS
400.0000 mg | ORAL_TABLET | Freq: Two times a day (BID) | ORAL | Status: DC
  Administered 2023-07-07 – 2023-07-12 (×11): 400 mg via ORAL
  Filled 2023-07-07 (×11): qty 1

## 2023-07-07 MED ORDER — SIMVASTATIN 20 MG PO TABS
40.0000 mg | ORAL_TABLET | Freq: Every day | ORAL | Status: DC
  Administered 2023-07-07 – 2023-07-11 (×5): 40 mg via ORAL
  Filled 2023-07-07 (×5): qty 2

## 2023-07-07 MED ORDER — ASPIRIN 81 MG PO TBEC: 81.0000 mg | DELAYED_RELEASE_TABLET | Freq: Every morning | ORAL | Status: DC

## 2023-07-07 MED ORDER — ONDANSETRON HCL 4 MG/2ML IJ SOLN
4.0000 mg | Freq: Three times a day (TID) | INTRAMUSCULAR | Status: DC | PRN
  Administered 2023-07-07: 4 mg via INTRAVENOUS
  Filled 2023-07-07: qty 2

## 2023-07-07 MED ORDER — INSULIN ASPART 100 UNIT/ML IJ SOLN
0.0000 [IU] | Freq: Every day | INTRAMUSCULAR | Status: DC
  Administered 2023-07-07: 4 [IU] via SUBCUTANEOUS
  Administered 2023-07-08: 3 [IU] via SUBCUTANEOUS

## 2023-07-07 MED ORDER — HYDROCORTISONE SOD SUC (PF) 100 MG IJ SOLR: 50.0000 mg | Freq: Every day | INTRAMUSCULAR | Status: DC

## 2023-07-07 MED ORDER — CALCIUM CARBONATE ANTACID 500 MG PO CHEW
1.0000 | CHEWABLE_TABLET | Freq: Three times a day (TID) | ORAL | Status: DC
  Administered 2023-07-07 – 2023-07-12 (×14): 200 mg via ORAL
  Filled 2023-07-07 (×15): qty 1

## 2023-07-07 MED ORDER — INSULIN ASPART 100 UNIT/ML IJ SOLN
0.0000 [IU] | Freq: Three times a day (TID) | INTRAMUSCULAR | Status: DC
  Administered 2023-07-07: 7 [IU] via SUBCUTANEOUS
  Administered 2023-07-07 (×2): 3 [IU] via SUBCUTANEOUS
  Administered 2023-07-08: 1 [IU] via SUBCUTANEOUS
  Administered 2023-07-08: 5 [IU] via SUBCUTANEOUS
  Administered 2023-07-08: 3 [IU] via SUBCUTANEOUS
  Administered 2023-07-09: 2 [IU] via SUBCUTANEOUS
  Administered 2023-07-09: 3 [IU] via SUBCUTANEOUS
  Administered 2023-07-10: 2 [IU] via SUBCUTANEOUS
  Administered 2023-07-10: 1 [IU] via SUBCUTANEOUS
  Administered 2023-07-10: 2 [IU] via SUBCUTANEOUS
  Administered 2023-07-11: 1 [IU] via SUBCUTANEOUS
  Administered 2023-07-11: 2 [IU] via SUBCUTANEOUS
  Administered 2023-07-11: 1 [IU] via SUBCUTANEOUS
  Administered 2023-07-12: 5 [IU] via SUBCUTANEOUS
  Administered 2023-07-12: 1 [IU] via SUBCUTANEOUS

## 2023-07-07 MED ORDER — PANTOPRAZOLE SODIUM 40 MG PO TBEC
40.0000 mg | DELAYED_RELEASE_TABLET | Freq: Every day | ORAL | Status: DC
  Administered 2023-07-07 – 2023-07-12 (×6): 40 mg via ORAL
  Filled 2023-07-07 (×6): qty 1

## 2023-07-07 MED ORDER — FILGRASTIM-AAFI 480 MCG/0.8ML IJ SOSY
480.0000 ug | PREFILLED_SYRINGE | Freq: Every day | INTRAMUSCULAR | Status: DC
  Administered 2023-07-07: 480 ug via SUBCUTANEOUS
  Filled 2023-07-07 (×2): qty 0.8

## 2023-07-07 MED ORDER — PERFLUTREN LIPID MICROSPHERE
1.0000 mL | INTRAVENOUS | Status: AC | PRN
  Administered 2023-07-07: 4 mL via INTRAVENOUS

## 2023-07-07 MED ORDER — DEXAMETHASONE 0.5 MG PO TABS
0.7500 mg | ORAL_TABLET | Freq: Every day | ORAL | Status: DC
  Administered 2023-07-08 – 2023-07-12 (×5): 0.75 mg via ORAL
  Filled 2023-07-07 (×3): qty 1.5
  Filled 2023-07-07: qty 1
  Filled 2023-07-07 (×2): qty 1.5

## 2023-07-07 MED ORDER — DOXYCYCLINE HYCLATE 100 MG PO TABS
100.0000 mg | ORAL_TABLET | Freq: Two times a day (BID) | ORAL | Status: DC
  Administered 2023-07-07 – 2023-07-08 (×3): 100 mg via ORAL
  Filled 2023-07-07 (×3): qty 1

## 2023-07-07 NOTE — Progress Notes (Signed)
 Progress Note   Patient: Brandon Rubio. WUJ:[id number] DOB: 07-08-1950 DOA: 07/05/2023     2 DOS: the patient was seen and examined on 07/07/2023   Brief hospital course: Brandon Rubio. is a 73 y.o. male with history of type 2 diabetes, hypertension, hyperlipidemia, adrenal insufficiency, SVT, metastatic non-small cell lung cancer being followed by oncologist Brandon Rubio last chemotherapy on 07/01/2023 was found to be confused minimally responsive and disheveled on his couch by his friend and was brought to the ER.  Per his granddaughter, since his last chemotherapy patient did indicate that he was getting more weaker and more sleepier. Question whether he missed his steroid doses he takes for adrenal insufficiency.   Patient had fever of 102, tachycardic, elevated lactic acid.  Chest x-ray with possible infiltrates admitted to hospitalist service for sepsis secondary to pneumonia, started broad-spectrum antibiotic therapy.  Assessment and Plan: Sepsis Possible bilateral pneumonia Vancomycin changed to doxy, continue Azactam and stop Flagyl. Fever, tachycardia, lactic acid improved. CT chest/ abdomen/ pelvis showed Multifocal ground-glass nodules/masses on the left greater than right consistent with multifocal adenocarcinoma progressed from 06/03/2023. Stable volume loss and rounded consolidation in the right lower lobe with calcifications, consistent with treated neoplasm and possible chronic aspiration component. Increased size of ground-glass nodules in the left and right upper lobe. Stopped gentle IV fluids. Blood cultures pending.  Acute encephalopathy Due to sepsis. He is back to baseline mental status. Continue neuro checks. SLP evaluation before starting diet. Fall, aspiration precautions.  SVT In the setting of sepsis. Cardiology on board, off amiodarone  gtt. Continue Lopressor  25 mg twice daily. Follow echo. Correct electrolytes.  Hypomagnesemia- Oral mag supplements  ordered.  Hyponatremia-  Due to high blood sugars. Sodium improved.  History of adrenal insufficiency: Patient takes oral steroids at home.   Stop stress dose steroids Solu-Cortef , transition to oral decadron  home dose. Will start oral PPI.  Pancytopenia- Related to chemotherapy. Discussed with Brandon Rubio primary oncologist of patient - advised granix  480mg  daily until ANC>1500  Metastatic non small cell lung cancer- Follows oncologist, last chemo on 07/01/23. Continue outpatient follow up with Brandon Rubio.  Type 2 diabetes mellitus with hyperglycemia: A1c 9.2.  Possibly due to use of steroids. Steroids changed to home dose. Continue Accu-Cheks, sliding scale insulin .  Debility Generalized weakness in the setting of multiple comorbs, chemo. PT/ OT evaluation for discharge needs.      Out of bed to chair. Incentive spirometry. Nursing supportive care. Fall, aspiration precautions. Diet:  Diet Orders (From admission, onward)     Start     Ordered   07/06/23 1151  Diet heart healthy/carb modified Fluid consistency: Thin  Diet effective now       Question:  Fluid consistency:  Answer:  Thin   07/06/23 1150           DVT prophylaxis: Place and maintain sequential compression device Start: 07/07/23 0826 SCDs Start: 07/05/23 2323  Level of care: Progressive   Code Status: Full Code  Subjective: Patient is seen and examined today morning.  Patient reported feeling nauseous, no vomiting or abdominal discomfort.  He did have reflux symptoms. Granddaughter at bedside.  Physical Exam: Vitals:   07/07/23 0300 07/07/23 0755 07/07/23 0847 07/07/23 1133  BP: 120/71 129/80  125/82  Pulse: 68 (!) 104 96 72  Resp: 18 20  20   Temp: (!) 97.5 F (36.4 C) (!) 97.3 F (36.3 C)  (!) 97.4 F (36.3 C)  TempSrc: Oral Oral  Oral  SpO2:  96% 97%  94%  Weight:      Height:        General - Elderly Caucasian male, no apparent distress HEENT - PERRLA, EOMI, atraumatic head, non  tender sinuses. Lung - Clear, basal rales, rhonchi, no wheezes. Heart - S1, S2 heard, no murmurs, rubs, trace pedal edema. Abdomen - Soft, non tender, bowel sounds good Neuro - Alert, awake and oriented x 3, non focal exam. Skin - Warm and dry.  Data Reviewed:      Latest Ref Rng & Units 07/07/2023    3:32 AM 07/06/2023    3:20 AM 07/05/2023    5:44 PM  CBC  WBC 4.0 - 10.5 K/uL 4.0 - 10.5 K/uL 1.1    1.2  1.9  5.1   Hemoglobin 13.0 - 17.0 g/dL 91.4 - 78.2 g/dL 95.6    21.3  08.6  57.8   Hematocrit 39.0 - 52.0 % 39.0 - 52.0 % 32.0    32.5  32.9  37.5   Platelets 150 - 400 K/uL 150 - 400 K/uL 64    58  PLATELET CLUMPS NOTED ON SMEAR, UNABLE TO ESTIMATE  74       Latest Ref Rng & Units 07/07/2023    3:32 AM 07/06/2023    3:20 AM 07/05/2023    5:44 PM  BMP  Glucose 70 - 99 mg/dL 469  629  528   BUN 8 - 23 mg/dL 21  13  15    Creatinine 0.61 - 1.24 mg/dL 4.13  2.44  0.10   Sodium 135 - 145 mmol/L 139  133  133   Potassium 3.5 - 5.1 mmol/L 3.8  4.3  3.9   Chloride 98 - 111 mmol/L 110  103  98   CO2 22 - 32 mmol/L 20  18  23    Calcium 8.9 - 10.3 mg/dL 8.1  8.1  8.6    ECHOCARDIOGRAM COMPLETE Result Date: 07/07/2023    ECHOCARDIOGRAM REPORT   Patient Name:   Brandon Rubio. Date of Exam: 07/07/2023 Medical Rec #:  [id number]             Height:       72.0 in Accession #:    [id number]            Weight:       200.0 lb Date of Birth:  05/26/1950              BSA:          2.131 m Patient Age:    73 years              BP:           129/80 mmHg Patient Gender: M                     HR:           81 bpm. Exam Location:  Inpatient Procedure: 2D Echo (Both Spectral and Color Flow Doppler were utilized during            procedure). Indications:    dyspnea  History:        Patient has prior history of Echocardiogram examinations, most                 recent 10/06/2022. CAD, lung cancer, Signs/Symptoms:Dyspnea; Risk                 Factors:Hypertension, Dyslipidemia, Diabetes and Current  Smoker.  Sonographer:  Dione Franks RDCS Referring Phys: [id number] TAYLOR A PARCELLS IMPRESSIONS  1. No left ventricular thrombus is seen. Left ventricular ejection fraction, by estimation, is 55 to 60%. The left ventricle has normal function. The left ventricle demonstrates regional wall motion abnormalities (see scoring diagram/findings for description). Left ventricular diastolic parameters are consistent with Grade I diastolic dysfunction (impaired relaxation). There is moderate hypokinesis of the left ventricular, apical septal wall, anterior wall and inferior wall.  2. Right ventricular systolic function is normal. The right ventricular size is normal. There is normal pulmonary artery systolic pressure.  3. The mitral valve is normal in structure. No evidence of mitral valve regurgitation. No evidence of mitral stenosis.  4. The aortic valve is tricuspid. There is mild calcification of the aortic valve. Aortic valve regurgitation is not visualized. Aortic valve sclerosis is present, with no evidence of aortic valve stenosis.  5. There is borderline dilatation of the ascending aorta, measuring 38 mm.  6. The inferior vena cava is normal in size with greater than 50% respiratory variability, suggesting right atrial pressure of 3 mmHg. Comparison(s): No significant change from prior study. Prior images reviewed side by side. Retrospectively, the area of apical hypokinesis, consistent with distal LAD artery territory infarction, was also present on the 2023 and 2024 studies. FINDINGS  Left Ventricle: No left ventricular thrombus is seen. Left ventricular ejection fraction, by estimation, is 55 to 60%. The left ventricle has normal function. The left ventricle demonstrates regional wall motion abnormalities. Moderate hypokinesis of the left ventricular, apical septal wall, anterior wall and inferior wall. Definity  contrast agent was given IV to delineate the left ventricular endocardial borders. The left  ventricular internal cavity size was normal in size. There is no left ventricular hypertrophy. Left ventricular diastolic parameters are consistent with Grade I diastolic dysfunction (impaired relaxation). Normal left ventricular filling pressure.  LV Wall Scoring: The apical septal segment, apical inferior segment, and apex are hypokinetic. Right Ventricle: The right ventricular size is normal. No increase in right ventricular wall thickness. Right ventricular systolic function is normal. There is normal pulmonary artery systolic pressure. The tricuspid regurgitant velocity is 2.20 m/s, and  with an assumed right atrial pressure of 3 mmHg, the estimated right ventricular systolic pressure is 22.4 mmHg. Left Atrium: Left atrial size was normal in size. Right Atrium: Right atrial size was normal in size. Pericardium: There is no evidence of pericardial effusion. Mitral Valve: The mitral valve is normal in structure. No evidence of mitral valve regurgitation. No evidence of mitral valve stenosis. Tricuspid Valve: The tricuspid valve is normal in structure. Tricuspid valve regurgitation is trivial. Aortic Valve: The aortic valve is tricuspid. There is mild calcification of the aortic valve. Aortic valve regurgitation is not visualized. Aortic valve sclerosis is present, with no evidence of aortic valve stenosis. Pulmonic Valve: The pulmonic valve was not well visualized. Pulmonic valve regurgitation is not visualized. No evidence of pulmonic stenosis. Aorta: The aortic root is normal in size and structure. There is borderline dilatation of the ascending aorta, measuring 38 mm. Venous: The inferior vena cava is normal in size with greater than 50% respiratory variability, suggesting right atrial pressure of 3 mmHg. IAS/Shunts: The interatrial septum was not well visualized.  LEFT VENTRICLE PLAX 2D LVIDd:         4.60 cm   Diastology LVIDs:         3.20 cm   LV e' medial:    5.22 cm/s LV PW:  1.10 cm   LV E/e'  medial:  9.8 LV IVS:        1.00 cm   LV e' lateral:   5.98 cm/s LVOT diam:     2.20 cm   LV E/e' lateral: 8.6 LV SV:         79 LV SV Index:   37 LVOT Area:     3.80 cm  RIGHT VENTRICLE RV S prime:     11.60 cm/s LEFT ATRIUM             Index LA diam:        3.80 cm 1.78 cm/m LA Vol (A2C):   35.8 ml 16.80 ml/m LA Vol (A4C):   62.7 ml 29.43 ml/m LA Biplane Vol: 50.8 ml 23.84 ml/m  AORTIC VALVE LVOT Vmax:   104.00 cm/s LVOT Vmean:  67.100 cm/s LVOT VTI:    0.207 m  AORTA Ao Root diam: 3.70 cm Ao Asc diam:  3.80 cm MITRAL VALVE               TRICUSPID VALVE MV Area (PHT): 3.42 cm    TR Peak grad:   19.4 mmHg MV Decel Time: 222 msec    TR Vmax:        220.00 cm/s MV E velocity: 51.40 cm/s MV A velocity: 63.90 cm/s  SHUNTS MV E/A ratio:  0.80        Systemic VTI:  0.21 m                            Systemic Diam: 2.20 cm Karyl Paget Croitoru MD Electronically signed by Luana Rumple MD Signature Date/Time: 07/07/2023/10:57:12 AM    Final    CT CHEST ABDOMEN PELVIS W CONTRAST Result Date: 07/06/2023 CLINICAL DATA:  Pneumonia, complications suspected, with abdominal pain, history of non-small cell lung cancer with treatment, sepsis workup. EXAM: CT CHEST, ABDOMEN, AND PELVIS WITH CONTRAST TECHNIQUE: Multidetector CT imaging of the chest, abdomen and pelvis was performed following the standard protocol during bolus administration of intravenous contrast. RADIATION DOSE REDUCTION: This exam was performed according to the departmental dose-optimization program which includes automated exposure control, adjustment of the mA and/or kV according to patient size and/or use of iterative reconstruction technique. CONTRAST:  75mL OMNIPAQUE  IOHEXOL  350 MG/ML SOLN COMPARISON:  CT chest, abdomen and pelvis without contrast earlier today at 1:03 a.m., and CT chest, abdomen and pelvis with IV contrast 06/03/2023. FINDINGS: CT CHEST FINDINGS Cardiovascular: The cardiac size is normal. There is patchy left main and three-vessel coronary  artery calcification. No pericardial effusion. There is trace air anteriorly in the pulmonary trunk and right ventricle probably iatrogenic. Aortic and minimal great vessel atherosclerosis without aneurysm, stenosis or dissection. The great vessels are widely patent. The pulmonary arteries and veins are normal in caliber. The pulmonary arteries are centrally clear. Mediastinum/Nodes: No enlarged intrathoracic or axillary lymph nodes. The trachea and main bronchi are clear. Unremarkable thyroid  gland, thoracic esophagus. Lungs/Pleura: Small right and minimal left layering pleural effusions, unchanged. Centrilobular and paraseptal emphysematous changes are again noted, predominating in the upper lobes. Multifocal ground-glass nodules/masses on the left greater than right consistent with multifocal adenocarcinoma, are again noted. Largest again measuring 6.5 x 5.1 cm in the left lower lobe (4: 81 and 84). Volume loss and sizable rounded consolidation in the right lower lobe with calcifications appears similar to prior studies, consistent with treated neoplasm and possible chronic aspiration component. There are again small  ground-glass nodules in the nonconsolidated portion of the right lower lobe. Additional ground-glass nodules in the left upper lobe most confluent at the hilar structures, for example measuring 3.8 x 3.5 cm on 4:72, previously 3.4 x 1 7 cm. There is a ground-glass nodule in the posterior right upper lobe today measuring 1.8 cm on 4:88, 1 month ago was 1.6 cm. No acute airspace infiltrate is seen. Musculoskeletal: No regional bone metastasis is seen. CT ABDOMEN PELVIS FINDINGS Hepatobiliary: The liver is mildly steatotic. No mass is seen. Gallbladder is contracted but without inflammatory changes or stones. No biliary dilatation. Pancreas: No abnormality. Spleen: No abnormality. Adrenals/Urinary Tract: There is no adrenal mass. No mass enhancement in either kidney. There is chronic perinephric  stranding which appears stable. There is a 3.7 cm Bosniak 1 cyst in the posterior left kidney, Hounsfield density is -0.6. No follow-up imaging is recommended. There is no urinary stone or obstruction. The bladder is unremarkable. Stomach/Bowel: No bowel dilatation or wall thickening including the appendix. There is fluid in the colon compatible with diarrhea. There are chronic thickened folds in the stomach. Vascular/Lymphatic: Aortic atherosclerosis. No enlarged abdominal or pelvic lymph nodes. 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 No AAA. Reproductive: Prostate is unremarkable. Other: Minimal ascites, new from last month. No free air, free hemorrhage, abscess or incarcerated hernia. Musculoskeletal: No regional bone metastasis is seen. IMPRESSION: 1. No acute chest CT findings. No findings in the abdomen and pelvis suggesting a site for sepsis. 2. Stable small right and minimal left pleural effusions. 3. Emphysema. 4. Multifocal ground-glass nodules/masses on the left greater than right consistent with multifocal adenocarcinoma. Some lesions progressive from 06/03/2023. 5. Stable volume loss and rounded consolidation in the right lower lobe with calcifications, consistent with treated neoplasm and possible chronic aspiration component. 6. Increased size of ground-glass nodules in the left and right upper lobe. 7. Aortic and coronary artery atherosclerosis. 8. Minimal ascites, new from last month. 9. Fluid in the colon compatible with diarrhea. No bowel obstruction or inflammation. 10. Chronic thickened folds in the stomach. 11. 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 Aortic Atherosclerosis (ICD10-I70.0) and Emphysema (ICD10-J43.9). Electronically Signed   By: Denman Fischer M.D.   On: 07/06/2023 22:46   CT CHEST ABDOMEN PELVIS WO CONTRAST Result Date: 07/06/2023 EXAM: CT CHEST, ABDOMEN AND PELVIS WITHOUT CONTRAST 07/06/2023 01:19:14 AM TECHNIQUE: CT of the  chest, abdomen and pelvis was performed Multiplanar reformatted images are provided for review. Automated exposure control, iterative reconstruction, and/or weight based adjustment of the mA/kV was utilized to reduce the radiation dose to as low as reasonably achievable. COMPARISON: 06/03/2023 CLINICAL HISTORY: Pneumonia, complication suspected, xray done; Abdominal pain, acute, nonlocalized. History of lung cancer. FINDINGS: MEDIASTINUM: Mild 3-vessel coronary atherosclerosis. Mild atherosclerotic calcification of the aortic arch. LYMPH NODES: No evidence of mediastinal, hilar or axillary lymphadenopathy. LUNGS AND PLEURA: Radiation changes/scarring in the right lower lobe with suspected superimposed aspiration, chronic. Scattered multifocal ground glass nodules/masses, measuring up to 5.7 cm in the left lower lobe (image 72), grossly unchanged and compatible with known multifocal adenocarcinoma. Mild centrilobular and paraseptal emphysematous changes, upper lung predominant. Trace right pleural effusion, chronic. HEPATOBILIARY: The liver is unremarkable. Gallbladder is unremarkable. No biliary ductal dilatation. SPLEEN: No acute abnormality. PANCREAS: No acute abnormality. ADRENAL GLANDS: No acute abnormality. KIDNEYS, URETERS AND BLADDER: 3.6 cm simple left renal cyst (image 53), benign, no follow-up is recommended. No stones in the kidneys or ureters. No evidence  of hydronephrosis. No evidence of perinephric or periureteral stranding. GI AND BOWEL: Stomach demonstrates no acute abnormality. There is no evidence of bowel obstruction. No evidence of appendicitis. REPRODUCTIVE: Reproductive organs are unremarkable. PERITONEUM AND RETROPERITONEUM: No evidence of ascites or free air. Atherosclerotic calcifications of the abdominal aorta and branch vessels. LYMPH NODES: No evidence of lymphadenopathy. BONES AND SOFT TISSUES: No acute osseous abnormality. No focal soft tissue abnormality. Incidental adrenal and/or  renal findings do not require follow up imaging. IMPRESSION: 1. Stable multifocal adenocarcinoma. 2. Radiation changes/scarring in the right lower lobe with suspected superimposed aspiration. 3. Trace right pleural effusion, chronic. 4. No acute findings in the abdomen/pelvis. Electronically signed by: Zadie Herter MD 07/06/2023 01:31 AM EDT RP Workstation: [id number]   CT Head Wo Contrast Result Date: 07/05/2023 CLINICAL DATA:  Mental status change, unknown cause EXAM: CT HEAD WITHOUT CONTRAST TECHNIQUE: Contiguous axial images were obtained from the base of the skull through the vertex without intravenous contrast. RADIATION DOSE REDUCTION: This exam was performed according to the departmental dose-optimization program which includes automated exposure control, adjustment of the mA and/or kV according to patient size and/or use of iterative reconstruction technique. COMPARISON:  05/10/2021. FINDINGS: Brain: There is periventricular white matter decreased attenuation consistent with small vessel ischemic changes. Ventricles, sulci and cisterns are prominent consistent with age related involutional changes. No acute intracranial hemorrhage, mass effect or shift. No hydrocephalus. Vascular: No hyperdense vessel or unexpected calcification. Skull: Normal. Negative for fracture or focal lesion. Sinuses/Orbits: No acute finding. IMPRESSION: Atrophy and chronic small vessel ischemic changes. No acute intracranial process identified. Electronically Signed   By: Sydell Eva M.D.   On: 07/05/2023 19:58   DG Chest Portable 1 View Result Date: 07/05/2023 CLINICAL DATA:  Altered mental status.  Found down. EXAM: PORTABLE CHEST 1 VIEW COMPARISON:  10/02/2022. FINDINGS: Dense consolidation at the left base is somewhat mass-like appearing. The this could represent pneumonia and follow up is recommended to confirm resolution. There is linear subsegmental atelectasis or scarring in the right perihilar region. There  is density consistent with consolidation or volume loss in the medial right base. No pneumothorax. No definite pleural effusion. Normal pulmonary vasculature. IMPRESSION: Bibasilar opacities, left greater than right. Follow-up recommended to confirm resolution. Electronically Signed   By: Sydell Eva M.D.   On: 07/05/2023 19:56    Family Communication: Discussed with patient, grand daughter at bedside. They understand and agree. All questions answered.  Disposition: Status is: Inpatient Remains inpatient appropriate because: Sepsis, IV antibiotics, IV fluids.  Planned Discharge Destination: Home with Home Health     MDM level 3- Patient is admitted for sepsis, he is immunocompromised due to chemo, on broad spectrum antibiotics. He has electrolyte abnormalities, pancytopenia, high sugars. He is at risk for sudden clinical deterioration.  Author: Aisha Hove, MD 07/07/2023 12:38 PM Secure chat 7am to 7pm For on call review [email or web address].

## 2023-07-07 NOTE — Progress Notes (Signed)
  Echocardiogram 2D Echocardiogram has been performed.  Brandon Rubio 07/07/2023, 10:33 AM

## 2023-07-07 NOTE — Evaluation (Signed)
 Physical Therapy Evaluation Patient Details Name: Brandon Rubio. MRN: [id number] DOB: [birth year] Today's Date: 07/07/2023  History of Present Illness  73yo male admitted 07/05/23 with confusion and weakness. Episode of SVT vs VT, suspect due to PNA and sepsis. CXR: BLL opacties L>R. CTHead: atrophy and chronic small vessel ischemic changes, no acute process.   PMH: metastatic non-small cell lung cancer (last chemo 07/01/23), CAD, NSTEMI, cataracts, DM2, HLT, HTN, peripheral neuropathy, tobacco.  Clinical Impression   Pt presents with generalized weakness, impaired balance, impaired activity tolerance. Pt to benefit from acute PT to address deficits. Pt ambulated hallway distance with use of RW for balance, overall requiring close guard to supervision for safety during mobility. Pt is independent at baseline, anticipate good functional recovery with continued mobility acutely. PT to progress mobility as tolerated, and will continue to follow acutely.      HR <100 bpm during gait, SpO2 92% and greater on RA, RR up to 40 breaths/min    If plan is discharge home, recommend the following: A little help with walking and/or transfers;A little help with bathing/dressing/bathroom   Can travel by private vehicle        Equipment Recommendations None recommended by PT  Recommendations for Other Services       Functional Status Assessment Patient has had a recent decline in their functional status and demonstrates the ability to make significant improvements in function in a reasonable and predictable amount of time.     Precautions / Restrictions Precautions Precautions: Fall Recall of Precautions/Restrictions: Impaired Precaution/Restrictions Comments: low WBC count, pt declined PT wearing mask as a protection Restrictions Weight Bearing Restrictions Per Provider Order: No      Mobility  Bed Mobility Overal bed mobility: Needs Assistance Bed Mobility: Supine to Sit     Supine to  sit: Min assist     General bed mobility comments: assist for trunk elevation via HHA    Transfers Overall transfer level: Needs assistance Equipment used: Rolling walker (2 wheels) Transfers: Sit to/from Stand Sit to Stand: Contact guard assist           General transfer comment: for safety, slow to rise and steady. Stand x2, from EOB and chair in room    Ambulation/Gait Ambulation/Gait assistance: Supervision Gait Distance (Feet): 200 Feet Assistive device: Rolling walker (2 wheels) Gait Pattern/deviations: Step-through pattern, Decreased stride length, Trunk flexed Gait velocity: decr     General Gait Details: for safety, cues for close proximity to RW and upright posture. HR <100 bpm during giat, SpO2 92% and greater on RA, RR up to 40 breaths/min  Stairs            Wheelchair Mobility     Tilt Bed    Modified Rankin (Stroke Patients Only)       Balance Overall balance assessment: Needs assistance Sitting-balance support: No upper extremity supported, Feet supported Sitting balance-Leahy Scale: Good     Standing balance support: Bilateral upper extremity supported, During functional activity Standing balance-Leahy Scale: Fair Standing balance comment: can stand statically without UE support, reliant on RW dynamically                             Pertinent Vitals/Pain Pain Assessment Pain Assessment: 0-10 Pain Score: 6  Pain Location: indigestion Pain Descriptors / Indicators: Discomfort Pain Intervention(s): Limited activity within patient's tolerance, Monitored during session    Home Living Family/patient expects to be discharged to:: Private residence Living  Arrangements: Alone Available Help at Discharge: Friend(s);Available PRN/intermittently;Family Type of Home: House Home Access: Stairs to enter Entrance Stairs-Rails: None Entrance Stairs-Number of Steps: 1   Home Layout: One level Home Equipment: Agricultural consultant (2  wheels);Crutches      Prior Function Prior Level of Function : Independent/Modified Independent;Driving                     Extremity/Trunk Assessment   Upper Extremity Assessment Upper Extremity Assessment: Defer to OT evaluation    Lower Extremity Assessment Lower Extremity Assessment: Generalized weakness    Cervical / Trunk Assessment Cervical / Trunk Assessment: Normal  Communication   Communication Communication: No apparent difficulties    Cognition Arousal: Alert Behavior During Therapy: WFL for tasks assessed/performed   PT - Cognitive impairments: No family/caregiver present to determine baseline                       PT - Cognition Comments: pt thinking he sees bugs on the ground and in his bed, passes quickly. requires safety cuing         Cueing       General Comments      Exercises     Assessment/Plan    PT Assessment Patient needs continued PT services  PT Problem List Decreased strength;Decreased mobility;Decreased activity tolerance;Decreased balance;Decreased knowledge of use of DME;Cardiopulmonary status limiting activity;Decreased safety awareness       PT Treatment Interventions DME instruction;Therapeutic activities;Gait training;Therapeutic exercise;Patient/family education;Balance training;Stair training;Functional mobility training;Neuromuscular re-education    PT Goals (Current goals can be found in the Care Plan section)  Acute Rehab PT Goals Patient Stated Goal: home PT Goal Formulation: With patient Time For Goal Achievement: 07/21/23 Potential to Achieve Goals: Good    Frequency Min 3X/week     Co-evaluation               AM-PAC PT "6 Clicks" Mobility  Outcome Measure Help needed turning from your back to your side while in a flat bed without using bedrails?: A Little Help needed moving from lying on your back to sitting on the side of a flat bed without using bedrails?: A Little Help needed  moving to and from a bed to a chair (including a wheelchair)?: A Little Help needed standing up from a chair using your arms (e.g., wheelchair or bedside chair)?: A Little Help needed to walk in hospital room?: A Little Help needed climbing 3-5 steps with a railing? : A Lot 6 Click Score: 17    End of Session Equipment Utilized During Treatment: Oxygen Activity Tolerance: Patient tolerated treatment well Patient left: with call bell/phone within reach;with bed alarm set;in bed;with SCD's reapplied Nurse Communication: Mobility status PT Visit Diagnosis: Other abnormalities of gait and mobility (R26.89);Muscle weakness (generalized) (M62.81)    Time: 4098-1191 PT Time Calculation (min) (ACUTE ONLY): 30 min   Charges:   PT Evaluation $PT Eval Low Complexity: 1 Low PT Treatments $Gait Training: 8-22 mins PT General Charges $$ ACUTE PT VISIT: 1 Visit        Shirlene Doughty, PT DPT Acute Rehabilitation Services Secure Chat Preferred  Office [phone]   Ziad Maye Cydney Draft 07/07/2023, 4:41 PM

## 2023-07-07 NOTE — Plan of Care (Signed)
  Problem: Fluid Volume: Goal: Ability to maintain a balanced intake and output will improve Outcome: Progressing   Problem: Nutritional: Goal: Maintenance of adequate nutrition will improve Outcome: Progressing   Problem: Activity: Goal: Risk for activity intolerance will decrease Outcome: Progressing

## 2023-07-07 NOTE — Inpatient Diabetes Management (Signed)
 Inpatient Diabetes Program Recommendations  AACE/ADA: New Consensus Statement on Inpatient Glycemic Control (2015)  Target Ranges:  Prepandial:   less than 140 mg/dL      Peak postprandial:   less than 180 mg/dL (1-2 hours)      Critically ill patients:  140 - 180 mg/dL   Lab Results  Component Value Date   GLUCAP 266 (H) 07/07/2023   HGBA1C 9.2 (H) 06/29/2023    Review of Glycemic Control  Latest Reference Range & Units 07/06/23 08:13 07/06/23 11:32 07/06/23 16:25 07/06/23 20:04 07/06/23 23:30 07/07/23 03:09 07/07/23 07:57  Glucose-Capillary 70 - 99 mg/dL 098 (H) 119 (H) 147 (H) 220 (H) 168 (H) 180 (H) 266 (H)  (H): Data is abnormally high  Diabetes history: DM2 Outpatient Diabetes medications: Metformin  1 gm BID Current orders for Inpatient glycemic control: Novolog  0-9 units TID and 0-5 units at bedtime, Solucortef 75 mg BID  Inpatient Diabetes Program Recommendations:    While on steroids, might consider:  Novolog  2 units TID with meals if he consumes at least 50%.  Will continue to follow while inpatient.  Thank you, Hays Lipschutz, MSN, CDCES Diabetes Coordinator Inpatient Diabetes Program [phone] (team pager from 8a-5p)

## 2023-07-07 NOTE — Plan of Care (Signed)
  Problem: Fluid Volume: Goal: Ability to maintain a balanced intake and output will improve Outcome: Progressing   Problem: Coping: Goal: Ability to adjust to condition or change in health will improve Outcome: Progressing   Problem: Skin Integrity: Goal: Risk for impaired skin integrity will decrease Outcome: Progressing   Problem: Tissue Perfusion: Goal: Adequacy of tissue perfusion will improve Outcome: Progressing   Problem: Respiratory: Goal: Ability to maintain adequate ventilation will improve Outcome: Progressing   Problem: Education: Goal: Knowledge of General Education information will improve Description: Including pain rating scale, medication(s)/side effects and non-pharmacologic comfort measures Outcome: Progressing   Problem: Clinical Measurements: Goal: Respiratory complications will improve Outcome: Progressing   Problem: Clinical Measurements: Goal: Diagnostic test results will improve Outcome: Progressing   Problem: Clinical Measurements: Goal: Will remain free from infection Outcome: Progressing   Problem: Clinical Measurements: Goal: Ability to maintain clinical measurements within normal limits will improve Outcome: Progressing Goal: Will remain free from infection Outcome: Progressing Goal: Diagnostic test results will improve Outcome: Progressing Goal: Respiratory complications will improve Outcome: Progressing Goal: Cardiovascular complication will be avoided Outcome: Progressing   Problem: Elimination: Goal: Will not experience complications related to urinary retention Outcome: Progressing   Problem: Skin Integrity: Goal: Risk for impaired skin integrity will decrease Outcome: Progressing   Problem: Safety: Goal: Ability to remain free from injury will improve Outcome: Progressing

## 2023-07-07 NOTE — Telephone Encounter (Signed)
 TC from patients grand-daughter, Brandon Rubio stating that patient is in the hospital and lab appt for tomorrow needs to be canceled. Sasha reports finding patient unconscious on Sunday and called 911. She states that patient "possibly has pneumonia, his WBCs are low and has a high fever."  Informed her that the office will be in contact once he is out of the hospital.  She voiced understanding.

## 2023-07-07 NOTE — Progress Notes (Addendum)
 Patient Name: Brandon Rubio. Date of Encounter: 07/07/2023 [facility] Cardiologist: Dorothye Gathers, MD   Interval Summary  .    Patient doing well, granddaughter in room Denies any chest pain, palpitations, shortness of breath His CBC this morning showed WBC of 1.1 -- differential in process  Denies any sick symptoms, no fever, chills, etc. Granddaughter was on phone with patient's oncologist providing updates   Vital Signs .    Vitals:   07/07/23 0200 07/07/23 0300 07/07/23 0755 07/07/23 0847  BP: (!) 118/59 120/71 129/80   Pulse:  68 (!) 104 96  Resp:  18 20   Temp:  (!) 97.5 F (36.4 C) (!) 97.3 F (36.3 C)   TempSrc:  Oral Oral   SpO2:  96% 97%   Weight:      Height:       Intake/Output Summary (Last 24 hours) at 07/07/2023 0923 Last data filed at 07/07/2023 0700 Gross per 24 hour  Intake 3285.29 ml  Output 2000 ml  Net 1285.29 ml      07/05/2023    5:57 PM 07/01/2023    9:36 AM 06/29/2023    1:05 PM  Last 3 Weights  Weight (lbs) 200 lb 202 lb 1.6 oz 202 lb 3.2 oz  Weight (kg) 90.719 kg 91.672 kg 91.717 kg     Telemetry/ECG    Sinus rhythm, HR 90s, PVCs - Personally Reviewed  Physical Exam .   GEN: No acute distress, family present in room.   Neck: No JVD Cardiac: RRR, no murmurs, rubs, or gallops.  Respiratory: Clear to auscultation bilaterally. GI: Soft, nontender, non-distended  MS: No edema  Assessment & Plan .     [place]. is a 73 y.o. 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, current tobacco use, type 2 diabetes, HTN, HLD, adrenal insufficiency on chronic prednisone  who is being seen today for the evaluation of VT vs SVT in the setting of sepsis secondary to PNA   Episode of SVT vs VT History of SVT Presented to ED via EMS with PNA and sepsis, appeared critically ill and unresponsive  Was found by friend at home,  laying on his couch, soiled and confused  Reportedly developed tachycardia with HR 220-230s There are no EKGs from this time and only a single lead telemetry strip Seen by fellow last night, suspected to be SVT due to known past history, and single lead telemetry having unchanged QRS axis from prior beats Given amiodarone  due to unstable state  Likely that arrhythmia was triggered by acute illness  Converted back to sinus in the ED right before undergoing cardioversion  Most recent HR 90s Currently in sinus rhythm  Continue  Lopressor  25 mg BID at home Continue to monitor telemetry  Treatment of underlying sepsis per primary team  Awaiting updated echocardiogram   Deconditioning Weakness  Patient currently live at home, alone with his dog  Undergoing chemotherapy for his lung cancer His family expresses concerns about his well being  They are requesting some sort of home health PT/OT be ordered at discharge to ensure that he has someone working with him at home if that's where he is to discharge to  They also express concerns about possible need for home oxygen, once patient is stable from walking standpoint can do an ambulatory test to determine need for oxygen requirement  Above to be arranged by primary  team, primary MD has been notified    CAD s/p NSTEMI 2009 Hyperlipidemia  Elevated troponin levels  Troponin levels 42 > 24 Likely in the setting of sepsis  No acute changes noted on EKG  06/29/2023: HDL 62; LDL Cholesterol (Calc) 82 9/60/4540: ALT 25  Held ASA in the setting of pancytopenia  Continue simvastatin  40 mg daily    Per primary PNA Sepsis  Acute encephalopathy, resolved  Diabetes Adrenal insufficiency Anemia Pancytopenia   Metastatic lung cancer  Electrolyte disorders     For questions or updates, please contact [facility] Please consult [email or web address] for contact info under   Signed, Jiles Mote, PA-C  As above.  Patient has not had  chest pain or dyspnea.  Telemetry has been reviewed and shows sinus rhythm with occasional PVC with no recurrent wide-complex tachycardia.  Amiodarone  was discontinued.  Continue Lopressor .  Await results of follow-up echocardiogram to reassess LV function. Alexandria Angel, MD

## 2023-07-08 ENCOUNTER — Encounter: Payer: Self-pay | Admitting: Internal Medicine

## 2023-07-08 ENCOUNTER — Inpatient Hospital Stay

## 2023-07-08 DIAGNOSIS — D61818 Other pancytopenia: Secondary | ICD-10-CM

## 2023-07-08 DIAGNOSIS — C3491 Malignant neoplasm of unspecified part of right bronchus or lung: Secondary | ICD-10-CM | POA: Diagnosis not present

## 2023-07-08 DIAGNOSIS — I471 Supraventricular tachycardia, unspecified: Secondary | ICD-10-CM | POA: Diagnosis not present

## 2023-07-08 LAB — CBC WITH DIFFERENTIAL/PLATELET
Abs Immature Granulocytes: 0.01 10*3/uL (ref 0.00–0.07)
Basophils Absolute: 0 10*3/uL (ref 0.0–0.1)
Basophils Relative: 2 %
Eosinophils Absolute: 0 10*3/uL (ref 0.0–0.5)
Eosinophils Relative: 1 %
HCT: 29.2 % — ABNORMAL LOW (ref 39.0–52.0)
Hemoglobin: 9.7 g/dL — ABNORMAL LOW (ref 13.0–17.0)
Immature Granulocytes: 1 %
Lymphocytes Relative: 43 %
Lymphs Abs: 0.9 10*3/uL (ref 0.7–4.0)
MCH: 31.3 pg (ref 26.0–34.0)
MCHC: 33.2 g/dL (ref 30.0–36.0)
MCV: 94.2 fL (ref 80.0–100.0)
Monocytes Absolute: 0.2 10*3/uL (ref 0.1–1.0)
Monocytes Relative: 11 %
Neutro Abs: 0.9 10*3/uL — ABNORMAL LOW (ref 1.7–7.7)
Neutrophils Relative %: 42 %
Platelets: UNDETERMINED 10*3/uL (ref 150–400)
RBC: 3.1 MIL/uL — ABNORMAL LOW (ref 4.22–5.81)
RDW: 16 % — ABNORMAL HIGH (ref 11.5–15.5)
Smear Review: UNDETERMINED
WBC: 2 10*3/uL — ABNORMAL LOW (ref 4.0–10.5)
nRBC: 0 % (ref 0.0–0.2)

## 2023-07-08 LAB — GLUCOSE, CAPILLARY
Glucose-Capillary: 293 mg/dL — ABNORMAL HIGH (ref 70–99)
Glucose-Capillary: 137 mg/dL — ABNORMAL HIGH (ref 70–99)
Glucose-Capillary: 214 mg/dL — ABNORMAL HIGH (ref 70–99)
Glucose-Capillary: 253 mg/dL — ABNORMAL HIGH (ref 70–99)

## 2023-07-08 LAB — CBC
HCT: 29.4 % — ABNORMAL LOW (ref 39.0–52.0)
Hemoglobin: 9.8 g/dL — ABNORMAL LOW (ref 13.0–17.0)
MCH: 31.3 pg (ref 26.0–34.0)
MCHC: 33.3 g/dL (ref 30.0–36.0)
MCV: 93.9 fL (ref 80.0–100.0)
Platelets: 79 10*3/uL — ABNORMAL LOW (ref 150–400)
RBC: 3.13 MIL/uL — ABNORMAL LOW (ref 4.22–5.81)
RDW: 15.9 % — ABNORMAL HIGH (ref 11.5–15.5)
WBC: 2.1 10*3/uL — ABNORMAL LOW (ref 4.0–10.5)
nRBC: 0 % (ref 0.0–0.2)

## 2023-07-08 LAB — BASIC METABOLIC PANEL WITH GFR
Anion gap: 7 (ref 5–15)
BUN: 23 mg/dL (ref 8–23)
CO2: 21 mmol/L — ABNORMAL LOW (ref 22–32)
Calcium: 7.9 mg/dL — ABNORMAL LOW (ref 8.9–10.3)
Chloride: 111 mmol/L (ref 98–111)
Creatinine, Ser: 1.09 mg/dL (ref 0.61–1.24)
GFR, Estimated: >60 mL/min (ref >60)
Glucose, Bld: 275 mg/dL — ABNORMAL HIGH (ref 70–99)
Potassium: 3.3 mmol/L — ABNORMAL LOW (ref 3.5–5.1)
Sodium: 139 mmol/L (ref 135–145)

## 2023-07-08 LAB — MAGNESIUM: Magnesium: 1.5 mg/dL — ABNORMAL LOW (ref 1.7–2.4)

## 2023-07-08 MED ORDER — HYDROCODONE-ACETAMINOPHEN 10-325 MG PO TABS
1.0000 | ORAL_TABLET | Freq: Four times a day (QID) | ORAL | Status: DC | PRN
  Administered 2023-07-08 – 2023-07-12 (×10): 1 via ORAL
  Filled 2023-07-08 (×10): qty 1

## 2023-07-08 MED ORDER — ACETAMINOPHEN 325 MG PO TABS
650.0000 mg | ORAL_TABLET | Freq: Four times a day (QID) | ORAL | Status: DC | PRN
  Administered 2023-07-09 – 2023-07-11 (×3): 650 mg via ORAL
  Filled 2023-07-08 (×3): qty 2

## 2023-07-08 MED ORDER — MAGNESIUM SULFATE 4 GM/100ML IV SOLN
4.0000 g | Freq: Once | INTRAVENOUS | Status: AC
Start: 2023-07-08 — End: 2023-07-08
  Administered 2023-07-08: 4 g via INTRAVENOUS
  Filled 2023-07-08: qty 100

## 2023-07-08 MED ORDER — LIDOCAINE 5 % EX PTCH
1.0000 | MEDICATED_PATCH | CUTANEOUS | Status: DC
Start: 2023-07-08 — End: 2023-07-12
  Administered 2023-07-08 – 2023-07-11 (×4): 1 via TRANSDERMAL
  Filled 2023-07-08 (×4): qty 1

## 2023-07-08 MED ORDER — POTASSIUM CHLORIDE CRYS ER 20 MEQ PO TBCR
40.0000 meq | EXTENDED_RELEASE_TABLET | ORAL | Status: AC
  Administered 2023-07-08 (×2): 40 meq via ORAL
  Filled 2023-07-08 (×2): qty 2

## 2023-07-08 NOTE — Progress Notes (Addendum)
 Patient Name: Brandon Rubio. Date of Encounter: 07/08/2023 [facility] Cardiologist: Dorothye Gathers, MD   Interval Summary  .    Resting comfortably in the bed, no family present  CBC this morning showed slight improvement in WBC  Patient overall feels okay, he talks about how he has a bunch of medical conditions and agrees that he likely needs someone to check on him at home to make sure he is okay Echo showed EF 55-60% with no significant changes from prior readings Patient remains in sinus with HR 70-80s  Stated he couldn't fall asleep but was given some pain medication this morning and feels better   Vital Signs .    Vitals:   07/08/23 0100 07/08/23 0303 07/08/23 0500 07/08/23 0828  BP:  107/67  (!) 138/93  Pulse: 70 70 78 84  Resp: 16 20 20 20   Temp:  97.6 F (36.4 C)  (!) 97.4 F (36.3 C)  TempSrc:  Oral  Oral  SpO2: 94% 93% 96% 100%  Weight:      Height:        Intake/Output Summary (Last 24 hours) at 07/08/2023 0918 Last data filed at 07/08/2023 0600 Gross per 24 hour  Intake 780 ml  Output 1400 ml  Net -620 ml      07/05/2023    5:57 PM 07/01/2023    9:36 AM 06/29/2023    1:05 PM  Last 3 Weights  Weight (lbs) 200 lb 202 lb 1.6 oz 202 lb 3.2 oz  Weight (kg) 90.719 kg 91.672 kg 91.717 kg      Telemetry/ECG    Sinus rhythm, HR 70-80s, PVCs - Personally Reviewed  Physical Exam .   GEN: No acute distress.   Neck: No JVD Cardiac: RRR, no murmurs, rubs, or gallops.  Respiratory: Clear to auscultation bilaterally. GI: Soft, nontender, non-distended  MS: No edema  Assessment & Plan .     Nygel Farrior. is a 72 y.o. 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, current tobacco use, type 2 diabetes, HTN, HLD, adrenal insufficiency on chronic prednisone  who is being seen today for the evaluation of VT vs SVT in the setting of sepsis secondary  to PNA   Episode of SVT vs VT History of SVT Presented to ED via EMS with PNA and sepsis, appeared critically ill and unresponsive  Was found by friend at home, laying on his couch, soiled and confused  Reportedly developed tachycardia with HR 220-230s There are no EKGs from this time and only a single lead telemetry strip Seen by fellow last night, suspected to be SVT due to known past history, and single lead telemetry having unchanged QRS axis from prior beats Given amiodarone  due to unstable state  Likely that arrhythmia was triggered by acute illness  Converted back to sinus in the ED right before undergoing cardioversion  Most recent HR 70-80s Currently in sinus rhythm  Echo this admission showed: EF 55-60%, RWMA noted on prior echos, G1DD, normal RV function, borderline dilation of ascending aorta at 38 mm, normal IVC  Continue Lopressor  25 mg BID  Continue to monitor telemetry  Treatment of underlying sepsis per primary team    Deconditioning Weakness  Patient currently live at home, alone with his dog  Undergoing chemotherapy for his lung cancer His family expresses concerns about his well being  They are requesting some sort of home health  PT/OT be ordered at discharge to ensure that he has someone working with him at home if that's where he is to discharge to  They also express concerns about possible need for home oxygen, once patient is stable from walking standpoint can do an ambulatory test to determine need for oxygen requirement  Above to be arranged by primary team, primary MD has been notified    CAD s/p NSTEMI 2009 Hyperlipidemia  Elevated troponin levels  Troponin levels 42 > 24 Likely in the setting of sepsis  No acute changes noted on EKG  06/29/2023: HDL 62; LDL Cholesterol (Calc) 82 06/16/8117: ALT 25  Held ASA in the setting of pancytopenia  Echo this admission shows no significant change from prior echos, results as above Continue simvastatin  40 mg daily     Per primary PNA Sepsis  Acute encephalopathy, resolved  Diabetes Adrenal insufficiency Anemia Pancytopenia   Metastatic lung cancer  Electrolyte disorders   For questions or updates, please contact [facility] Please consult [email or web address] for contact info under   Signed, Jiles Mote, PA-C  As above, patient seen examined.  He denies chest pain or dyspnea this morning.  Telemetry has been reviewed and shows sinus rhythm with occasional PVC.  No recurrent SVT.  Echocardiogram shows preserved LV function.  Amiodarone  has been discontinued and Toprol  resumed.  Would continue at present dose.  Initial rhythm disturbance felt possibly secondary to stress of pneumonia.  Cardiology will sign off.  Please call with questions.  Follow-up with Dr. Renna Cary as scheduled.  Platelet count is 79,000.  Okay to resume aspirin  from a cardiac standpoint. Alexandria Angel

## 2023-07-08 NOTE — Plan of Care (Signed)
  Problem: Skin Integrity: Goal: Risk for impaired skin integrity will decrease Outcome: Progressing   Problem: Tissue Perfusion: Goal: Adequacy of tissue perfusion will improve Outcome: Progressing   

## 2023-07-08 NOTE — Progress Notes (Signed)
 Brandon Rubio.   DOB:1950/06/13   WU#:[id number]      ASSESSMENT & PLAN:  Metastatic non-small cell lung cancer, adenocarcinoma - Diagnosed October 2022.  Noted to have disease progression December 2022. - Status post neoadjuvant treatment with carbo, Alimta, and nivolumab  every 3 weeks.  Concurrent chemoradiation administered.  Subsequent treatment with immunotherapy/Imfinzi  every 4 weeks. - Given second line systemic chemo with docetaxel  and ramucirumab  every 3 weeks, received first dose 04/08/2023. - Neulasta  injections were discontinued due to poor tolerance. - Medical oncology/Dr. Marguerita Shih following.  Patient last seen in outpatient oncology office on 07/01/2023.  Pancytopenia: Leukopenia -low WBC 2.0, likely related to therapy, no intervention at this time Anemia - low 9.7, no transfusional intervention indicated, continue to monitor CBC with differential Thrombocytopenia - platelets were clumped today and could not be analyzed, platelets has run in the upper 50s to low 100 range.  No transfusional intervention required  Generalized weakness - Secondary to malignancy and comorbidities - Continue supportive care   Code Status Full  Subjective:  Patient seen awake and alert laying in bed.  He is very pleasant and talkative.  Complains of mild to moderate headache, still with some midsternal chest wall pain which he states is relieved with current pain medication.  Still with some weakness.  Denies acute bleeding.  Reports he is hesitant to take any new medications during this hospitalization, encouraged to take meds as ordered.  No other acute distress is noted.  Objective:  Vitals:   07/08/23 0828 07/08/23 1110  BP: (!) 138/93 117/74  Pulse: 84 65  Resp: 20 16  Temp: (!) 97.4 F (36.3 C) (!) 97.4 F (36.3 C)  SpO2: 100% 95%     Intake/Output Summary (Last 24 hours) at 07/08/2023 1357 Last data filed at 07/08/2023 1342 Gross per 24 hour  Intake 1260 ml  Output 1400  ml  Net -140 ml     PHYSICAL EXAMINATION: ECOG PERFORMANCE STATUS: 3 - Symptomatic, >50% confined to bed  Vitals:   07/08/23 0828 07/08/23 1110  BP: (!) 138/93 117/74  Pulse: 84 65  Resp: 20 16  Temp: (!) 97.4 F (36.3 C) (!) 97.4 F (36.3 C)  SpO2: 100% 95%   Filed Weights   07/05/23 1757  Weight: 200 lb (90.7 kg)    GENERAL: alert, no distress and comfortable SKIN: skin color, texture, turgor are normal, no rashes or significant lesions EYES: normal, conjunctiva are pink and non-injected, sclera clear OROPHARYNX: no exudate, no erythema and lips, buccal mucosa, and tongue normal  NECK: supple, thyroid  normal size, non-tender, without nodularity LYMPH: no palpable lymphadenopathy in the cervical, axillary or inguinal LUNGS: clear to auscultation and percussion with normal breathing effort HEART: regular rate & rhythm and no murmurs and no lower extremity edema ABDOMEN: abdomen soft, non-tender and normal bowel sounds MUSCULOSKELETAL: no cyanosis of digits and no clubbing  PSYCH: alert & oriented x 3 with fluent speech NEURO: no focal motor/sensory deficits   All questions were answered. The patient knows to call the clinic with any problems, questions or concerns.   The total time spent in the appointment was 40 minutes encounter with patient including review of chart and various tests results, discussions about plan of care and coordination of care plan  Jacqualin Mate, NP 07/08/2023 1:57 PM    Labs Reviewed:  Lab Results  Component Value Date   WBC 2.0 (L) 07/08/2023   HGB 9.7 (L) 07/08/2023   HCT 29.2 (L) 07/08/2023  MCV 94.2 07/08/2023   PLT PLATELET CLUMPS NOTED ON SMEAR, UNABLE TO ESTIMATE 07/08/2023   Recent Labs    07/01/23 0901 07/05/23 1744 07/06/23 0320 07/07/23 0332 07/08/23 0308  NA 137 133* 133* 139 139  K 4.5 3.9 4.3 3.8 3.3*  CL 105 98 103 110 111  CO2 21* 23 18* 20* 21*  GLUCOSE 438* 178* 232* 185* 275*  BUN 19 15 13 21 23    CREATININE 1.00 1.07 0.89 0.98 1.09  CALCIUM 8.3* 8.6* 8.1* 8.1* 7.9*  GFRNONAA >60 >60 >60 >60 >60  PROT 6.3* 6.3* 5.1*  --   --   ALBUMIN  3.7 2.7* 2.2*  --   --   AST 19 26 22   --   --   ALT 30 30 25   --   --   ALKPHOS 57 50 43  --   --   BILITOT 0.5 1.3* 1.2  --   --     Studies Reviewed:  ECHOCARDIOGRAM COMPLETE Result Date: 07/07/2023    ECHOCARDIOGRAM REPORT   Patient Name:   Brandon Rubio. Date of Exam: 07/07/2023 Medical Rec #:  [id number]             Height:       72.0 in Accession #:    [id number]            Weight:       200.0 lb Date of Birth:  1950-05-22              BSA:          2.131 m Patient Age:    73 years              BP:           129/80 mmHg Patient Gender: M                     HR:           81 bpm. Exam Location:  Inpatient Procedure: 2D Echo (Both Spectral and Color Flow Doppler were utilized during            procedure). Indications:    dyspnea  History:        Patient has prior history of Echocardiogram examinations, most                 recent 10/06/2022. CAD, lung cancer, Signs/Symptoms:Dyspnea; Risk                 Factors:Hypertension, Dyslipidemia, Diabetes and Current Smoker.  Sonographer:    Dione Franks RDCS Referring Phys: [id number] TAYLOR A PARCELLS IMPRESSIONS  1. No left ventricular thrombus is seen. Left ventricular ejection fraction, by estimation, is 55 to 60%. The left ventricle has normal function. The left ventricle demonstrates regional wall motion abnormalities (see scoring diagram/findings for description). Left ventricular diastolic parameters are consistent with Grade I diastolic dysfunction (impaired relaxation). There is moderate hypokinesis of the left ventricular, apical septal wall, anterior wall and inferior wall.  2. Right ventricular systolic function is normal. The right ventricular size is normal. There is normal pulmonary artery systolic pressure.  3. The mitral valve is normal in structure. No evidence of mitral valve regurgitation.  No evidence of mitral stenosis.  4. The aortic valve is tricuspid. There is mild calcification of the aortic valve. Aortic valve regurgitation is not visualized. Aortic valve sclerosis is present, with no evidence of aortic valve stenosis.  5. There is borderline dilatation of  the ascending aorta, measuring 38 mm.  6. The inferior vena cava is normal in size with greater than 50% respiratory variability, suggesting right atrial pressure of 3 mmHg. Comparison(s): No significant change from prior study. Prior images reviewed side by side. Retrospectively, the area of apical hypokinesis, consistent with distal LAD artery territory infarction, was also present on the 2023 and 2024 studies. FINDINGS  Left Ventricle: No left ventricular thrombus is seen. Left ventricular ejection fraction, by estimation, is 55 to 60%. The left ventricle has normal function. The left ventricle demonstrates regional wall motion abnormalities. Moderate hypokinesis of the left ventricular, apical septal wall, anterior wall and inferior wall. Definity  contrast agent was given IV to delineate the left ventricular endocardial borders. The left ventricular internal cavity size was normal in size. There is no left ventricular hypertrophy. Left ventricular diastolic parameters are consistent with Grade I diastolic dysfunction (impaired relaxation). Normal left ventricular filling pressure.  LV Wall Scoring: The apical septal segment, apical inferior segment, and apex are hypokinetic. Right Ventricle: The right ventricular size is normal. No increase in right ventricular wall thickness. Right ventricular systolic function is normal. There is normal pulmonary artery systolic pressure. The tricuspid regurgitant velocity is 2.20 m/s, and  with an assumed right atrial pressure of 3 mmHg, the estimated right ventricular systolic pressure is 22.4 mmHg. Left Atrium: Left atrial size was normal in size. Right Atrium: Right atrial size was normal in size.  Pericardium: There is no evidence of pericardial effusion. Mitral Valve: The mitral valve is normal in structure. No evidence of mitral valve regurgitation. No evidence of mitral valve stenosis. Tricuspid Valve: The tricuspid valve is normal in structure. Tricuspid valve regurgitation is trivial. Aortic Valve: The aortic valve is tricuspid. There is mild calcification of the aortic valve. Aortic valve regurgitation is not visualized. Aortic valve sclerosis is present, with no evidence of aortic valve stenosis. Pulmonic Valve: The pulmonic valve was not well visualized. Pulmonic valve regurgitation is not visualized. No evidence of pulmonic stenosis. Aorta: The aortic root is normal in size and structure. There is borderline dilatation of the ascending aorta, measuring 38 mm. Venous: The inferior vena cava is normal in size with greater than 50% respiratory variability, suggesting right atrial pressure of 3 mmHg. IAS/Shunts: The interatrial septum was not well visualized.  LEFT VENTRICLE PLAX 2D LVIDd:         4.60 cm   Diastology LVIDs:         3.20 cm   LV e' medial:    5.22 cm/s LV PW:         1.10 cm   LV E/e' medial:  9.8 LV IVS:        1.00 cm   LV e' lateral:   5.98 cm/s LVOT diam:     2.20 cm   LV E/e' lateral: 8.6 LV SV:         79 LV SV Index:   37 LVOT Area:     3.80 cm  RIGHT VENTRICLE RV S prime:     11.60 cm/s LEFT ATRIUM             Index LA diam:        3.80 cm 1.78 cm/m LA Vol (A2C):   35.8 ml 16.80 ml/m LA Vol (A4C):   62.7 ml 29.43 ml/m LA Biplane Vol: 50.8 ml 23.84 ml/m  AORTIC VALVE LVOT Vmax:   104.00 cm/s LVOT Vmean:  67.100 cm/s LVOT VTI:    0.207 m  AORTA Ao Root diam: 3.70 cm Ao Asc diam:  3.80 cm MITRAL VALVE               TRICUSPID VALVE MV Area (PHT): 3.42 cm    TR Peak grad:   19.4 mmHg MV Decel Time: 222 msec    TR Vmax:        220.00 cm/s MV E velocity: 51.40 cm/s MV A velocity: 63.90 cm/s  SHUNTS MV E/A ratio:  0.80        Systemic VTI:  0.21 m                             Systemic Diam: 2.20 cm Karyl Paget Croitoru MD Electronically signed by Luana Rumple MD Signature Date/Time: 07/07/2023/10:57:12 AM    Final    CT CHEST ABDOMEN PELVIS W CONTRAST Result Date: 07/06/2023 CLINICAL DATA:  Pneumonia, complications suspected, with abdominal pain, history of non-small cell lung cancer with treatment, sepsis workup. EXAM: CT CHEST, ABDOMEN, AND PELVIS WITH CONTRAST TECHNIQUE: Multidetector CT imaging of the chest, abdomen and pelvis was performed following the standard protocol during bolus administration of intravenous contrast. RADIATION DOSE REDUCTION: This exam was performed according to the departmental dose-optimization program which includes automated exposure control, adjustment of the mA and/or kV according to patient size and/or use of iterative reconstruction technique. CONTRAST:  75mL OMNIPAQUE  IOHEXOL  350 MG/ML SOLN COMPARISON:  CT chest, abdomen and pelvis without contrast earlier today at 1:03 a.m., and CT chest, abdomen and pelvis with IV contrast 06/03/2023. FINDINGS: CT CHEST FINDINGS Cardiovascular: The cardiac size is normal. There is patchy left main and three-vessel coronary artery calcification. No pericardial effusion. There is trace air anteriorly in the pulmonary trunk and right ventricle probably iatrogenic. Aortic and minimal great vessel atherosclerosis without aneurysm, stenosis or dissection. The great vessels are widely patent. The pulmonary arteries and veins are normal in caliber. The pulmonary arteries are centrally clear. Mediastinum/Nodes: No enlarged intrathoracic or axillary lymph nodes. The trachea and main bronchi are clear. Unremarkable thyroid  gland, thoracic esophagus. Lungs/Pleura: Small right and minimal left layering pleural effusions, unchanged. Centrilobular and paraseptal emphysematous changes are again noted, predominating in the upper lobes. Multifocal ground-glass nodules/masses on the left greater than right consistent with multifocal  adenocarcinoma, are again noted. Largest again measuring 6.5 x 5.1 cm in the left lower lobe (4: 81 and 84). Volume loss and sizable rounded consolidation in the right lower lobe with calcifications appears similar to prior studies, consistent with treated neoplasm and possible chronic aspiration component. There are again small ground-glass nodules in the nonconsolidated portion of the right lower lobe. Additional ground-glass nodules in the left upper lobe most confluent at the hilar structures, for example measuring 3.8 x 3.5 cm on 4:72, previously 3.4 x 1 7 cm. There is a ground-glass nodule in the posterior right upper lobe today measuring 1.8 cm on 4:88, 1 month ago was 1.6 cm. No acute airspace infiltrate is seen. Musculoskeletal: No regional bone metastasis is seen. CT ABDOMEN PELVIS FINDINGS Hepatobiliary: The liver is mildly steatotic. No mass is seen. Gallbladder is contracted but without inflammatory changes or stones. No biliary dilatation. Pancreas: No abnormality. Spleen: No abnormality. Adrenals/Urinary Tract: There is no adrenal mass. No mass enhancement in either kidney. There is chronic perinephric stranding which appears stable. There is a 3.7 cm Bosniak 1 cyst in the posterior left kidney, Hounsfield density is -0.6. No follow-up imaging is recommended. There is no urinary  stone or obstruction. The bladder is unremarkable. Stomach/Bowel: No bowel dilatation or wall thickening including the appendix. There is fluid in the colon compatible with diarrhea. There are chronic thickened folds in the stomach. Vascular/Lymphatic: Aortic atherosclerosis. No enlarged abdominal or pelvic lymph nodes. 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 No AAA. Reproductive: Prostate is unremarkable. Other: Minimal ascites, new from last month. No free air, free hemorrhage, abscess or incarcerated hernia. Musculoskeletal: No regional bone metastasis is seen. IMPRESSION: 1. No acute chest CT  findings. No findings in the abdomen and pelvis suggesting a site for sepsis. 2. Stable small right and minimal left pleural effusions. 3. Emphysema. 4. Multifocal ground-glass nodules/masses on the left greater than right consistent with multifocal adenocarcinoma. Some lesions progressive from 06/03/2023. 5. Stable volume loss and rounded consolidation in the right lower lobe with calcifications, consistent with treated neoplasm and possible chronic aspiration component. 6. Increased size of ground-glass nodules in the left and right upper lobe. 7. Aortic and coronary artery atherosclerosis. 8. Minimal ascites, new from last month. 9. Fluid in the colon compatible with diarrhea. No bowel obstruction or inflammation. 10. Chronic thickened folds in the stomach. 11. 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 Aortic Atherosclerosis (ICD10-I70.0) and Emphysema (ICD10-J43.9). Electronically Signed   By: Denman Fischer M.D.   On: 07/06/2023 22:46   CT CHEST ABDOMEN PELVIS WO CONTRAST Result Date: 07/06/2023 EXAM: CT CHEST, ABDOMEN AND PELVIS WITHOUT CONTRAST 07/06/2023 01:19:14 AM TECHNIQUE: CT of the chest, abdomen and pelvis was performed Multiplanar reformatted images are provided for review. Automated exposure control, iterative reconstruction, and/or weight based adjustment of the mA/kV was utilized to reduce the radiation dose to as low as reasonably achievable. COMPARISON: 06/03/2023 CLINICAL HISTORY: Pneumonia, complication suspected, xray done; Abdominal pain, acute, nonlocalized. History of lung cancer. FINDINGS: MEDIASTINUM: Mild 3-vessel coronary atherosclerosis. Mild atherosclerotic calcification of the aortic arch. LYMPH NODES: No evidence of mediastinal, hilar or axillary lymphadenopathy. LUNGS AND PLEURA: Radiation changes/scarring in the right lower lobe with suspected superimposed aspiration, chronic. Scattered multifocal ground glass nodules/masses, measuring up to 5.7 cm in the  left lower lobe (image 72), grossly unchanged and compatible with known multifocal adenocarcinoma. Mild centrilobular and paraseptal emphysematous changes, upper lung predominant. Trace right pleural effusion, chronic. HEPATOBILIARY: The liver is unremarkable. Gallbladder is unremarkable. No biliary ductal dilatation. SPLEEN: No acute abnormality. PANCREAS: No acute abnormality. ADRENAL GLANDS: No acute abnormality. KIDNEYS, URETERS AND BLADDER: 3.6 cm simple left renal cyst (image 53), benign, no follow-up is recommended. No stones in the kidneys or ureters. No evidence of hydronephrosis. No evidence of perinephric or periureteral stranding. GI AND BOWEL: Stomach demonstrates no acute abnormality. There is no evidence of bowel obstruction. No evidence of appendicitis. REPRODUCTIVE: Reproductive organs are unremarkable. PERITONEUM AND RETROPERITONEUM: No evidence of ascites or free air. Atherosclerotic calcifications of the abdominal aorta and branch vessels. LYMPH NODES: No evidence of lymphadenopathy. BONES AND SOFT TISSUES: No acute osseous abnormality. No focal soft tissue abnormality. Incidental adrenal and/or renal findings do not require follow up imaging. IMPRESSION: 1. Stable multifocal adenocarcinoma. 2. Radiation changes/scarring in the right lower lobe with suspected superimposed aspiration. 3. Trace right pleural effusion, chronic. 4. No acute findings in the abdomen/pelvis. Electronically signed by: Zadie Herter MD 07/06/2023 01:31 AM EDT RP Workstation: [id number]   CT Head Wo Contrast Result Date: 07/05/2023 CLINICAL DATA:  Mental status change, unknown cause EXAM: CT HEAD WITHOUT CONTRAST TECHNIQUE: Contiguous axial images were obtained from the  base of the skull through the vertex without intravenous contrast. RADIATION DOSE REDUCTION: This exam was performed according to the departmental dose-optimization program which includes automated exposure control, adjustment of the mA and/or kV  according to patient size and/or use of iterative reconstruction technique. COMPARISON:  05/10/2021. FINDINGS: Brain: There is periventricular white matter decreased attenuation consistent with small vessel ischemic changes. Ventricles, sulci and cisterns are prominent consistent with age related involutional changes. No acute intracranial hemorrhage, mass effect or shift. No hydrocephalus. Vascular: No hyperdense vessel or unexpected calcification. Skull: Normal. Negative for fracture or focal lesion. Sinuses/Orbits: No acute finding. IMPRESSION: Atrophy and chronic small vessel ischemic changes. No acute intracranial process identified. Electronically Signed   By: Sydell Eva M.D.   On: 07/05/2023 19:58   DG Chest Portable 1 View Result Date: 07/05/2023 CLINICAL DATA:  Altered mental status.  Found down. EXAM: PORTABLE CHEST 1 VIEW COMPARISON:  10/02/2022. FINDINGS: Dense consolidation at the left base is somewhat mass-like appearing. The this could represent pneumonia and follow up is recommended to confirm resolution. There is linear subsegmental atelectasis or scarring in the right perihilar region. There is density consistent with consolidation or volume loss in the medial right base. No pneumothorax. No definite pleural effusion. Normal pulmonary vasculature. IMPRESSION: Bibasilar opacities, left greater than right. Follow-up recommended to confirm resolution. Electronically Signed   By: Sydell Eva M.D.   On: 07/05/2023 19:56

## 2023-07-08 NOTE — Inpatient Diabetes Management (Addendum)
 Inpatient Diabetes Program Recommendations  AACE/ADA: New Consensus Statement on Inpatient Glycemic Control   Target Ranges:  Prepandial:   less than 140 mg/dL      Peak postprandial:   less than 180 mg/dL (1-2 hours)      Critically ill patients:  140 - 180 mg/dL    Latest Reference Range & Units 07/07/23 07:57 07/07/23 11:30 07/07/23 17:15 07/07/23 20:02 07/08/23 05:50  Glucose-Capillary 70 - 99 mg/dL 161 (H) 096 (H) 045 (H) 334 (H) 293 (H)    Latest Reference Range & Units 06/29/23 13:43  Hemoglobin A1C <5.7 % 9.2 (H)   Review of Glycemic Control  Diabetes history: DM2 Outpatient Diabetes medications: Metformin  1000 mg BID Current orders for Inpatient glycemic control: Novolog  0-9 units TID with meals, Novolog  0-5 units at bedtime; Decadron  0.75 mg daily  Inpatient Diabetes Program Recommendations:    Insulin : If steroids are continued, please consider ordering Semglee  9 units Q24H.  HbgA1C:  A1C 9.2% on 06/29/23 indicating an average glucose of 217 mg/dl over the past 2-3 months.  NOTE: Per chart review, patient sees Dr. Aldona Amel (Endocrinologist) and was seen on 06/29/23. Per office note on 06/29/23, patient taking Metformin  1000 mg BID but he had been on Lantus  15 units daily and Jardiance  25 mg daily in the past which had been stopped due to good control. Per patient message on 06/30/23, A1C was 9.2% and patient was advised to restart Lantus  10 units at bedtime and increase by 2 units every 2 days as needed until sugars in the morning decrease under 130 mg/dl.   Addendum 07/08/23@12 :25--Spoke with patient at bedside about diabetes and home regimen for diabetes control. Patient reports taking Metformin  1000 mg BID as an outpatient for diabetes control. Patient reports taking DM medications as prescribed. Inquired if patient was taking Lantus  (as instructed by Dr. Aldona Amel on 06/30/23). Patient states he is only taking Metformin . 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 However, he does not want to upset his PCP who has been managing his DM for him. Patient states he use to take Jardiance  which was expensive and he has also took Janumet  for DM in the past.  Patient is not checking glucose at home but he has all needed supplies for glucose monitoring.  Patient reports that over the past 1 month or more, he was eating excessive amount of cakes, cookies, candy bars, and ice cream (he was craving it).  Patient also notes that he takes Decadron  the day of chemo and for 2 days following chemo, which he gets every 3 weeks.  Reviewed A1C results (9.2% on 06/29/23) and explained that current A1C indicates an average glucose of 217 mg/dl over the past 2-3 months. Discussed glucose and A1C goals. Discussed importance of checking CBGs and maintaining good CBG control to prevent long-term and short-term complications. Patient reports he plans to cut way back on the sweets. Asked that he start checking glucose at home and be sure to follow up with PCP regarding DM or follow up with Dr. Aldona Amel. Explained that even if he cuts back on sweets, he may still need additional DM medication to get DM back under control especially  given he has to take the steroids.  Patient verbalized understanding of information discussed and reports no further questions at this time related to diabetes.  Thanks, Beacher Limerick, RN, MSN, CDCES Diabetes Coordinator Inpatient Diabetes Program [phone] (Team Pager from  8am to 5pm)

## 2023-07-08 NOTE — Progress Notes (Signed)
 Mobility Specialist Progress Note:    07/08/23 1014  Mobility  Activity Ambulated with assistance in room;Ambulated with assistance in hallway  Level of Assistance Minimal assist, patient does 75% or more  Assistive Device Front wheel walker  Distance Ambulated (ft) 230 ft  Activity Response Tolerated well  Mobility Referral Yes  Mobility visit 1 Mobility  Mobility Specialist Start Time (ACUTE ONLY) 1000  Mobility Specialist Stop Time (ACUTE ONLY) 1014  Mobility Specialist Time Calculation (min) (ACUTE ONLY) 14 min   Pt received in bed, agreeable to mobility session. Ambulated in hallway and room via RW. Required MinA to stand and ambulate d/t unsteadiness. Tolerated well, c/o dizziness. Returned pt to room, BP 121/84 (95). Left pt with all needs met, bed alarm on, call bell in reach.    Christy Ehrsam Mobility Specialist Please contact via Special educational needs teacher or  Rehab office at [phone]

## 2023-07-08 NOTE — Plan of Care (Signed)
  Problem: Fluid Volume: Goal: Ability to maintain a balanced intake and output will improve Outcome: Progressing   Problem: Metabolic: Goal: Ability to maintain appropriate glucose levels will improve Outcome: Progressing   Problem: Nutritional: Goal: Maintenance of adequate nutrition will improve Outcome: Progressing Goal: Progress toward achieving an optimal weight will improve Outcome: Progressing   Problem: Skin Integrity: Goal: Risk for impaired skin integrity will decrease Outcome: Progressing   Problem: Tissue Perfusion: Goal: Adequacy of tissue perfusion will improve Outcome: Progressing   Problem: Clinical Measurements: Goal: Signs and symptoms of infection will decrease Outcome: Progressing   Problem: Respiratory: Goal: Ability to maintain adequate ventilation will improve Outcome: Progressing   Problem: Clinical Measurements: Goal: Will remain free from infection Outcome: Progressing   Problem: Clinical Measurements: Goal: Respiratory complications will improve Outcome: Progressing

## 2023-07-08 NOTE — Hospital Course (Addendum)
 Patient with PMH of type II DM, HTN, HLD, adrenal insufficiency, non-small cell lung cancer on chemotherapy, presented to the hospital with complaints of lethargic event.  Currently being treated for possible pneumonia with sepsis.  Assessment and Plan: Concern for sepsis with pneumonia. CT chest abdomen pelvis shows evidence of groundglass opacities of bilaterally.  Left more than right. Procalcitonin level is negative. Sepsis physiology appears to have resolved for now. Blood cultures are negative Switch to oral antibiotic.  Levaquin .  Lethargy with acute on metabolic encephalopathy. Likely in setting of infection. No focal deficit exam on my evaluation. PT OT consult. Monitor.  SVT. In the setting of sepsis. Cardiology was consulted. Was on amiodarone . Currently off of the medication.  On Lopressor .  Monitor. Echocardiogram shows 55 to 60% EF, also shows some wall motion abnormality grade 1 diastolic dysfunction. Outpatient follow-up with cardiology recommended.  Chronic back pain. On Norco at home. Currently receiving Percocet here in the hospital at a lower dose. Will monitor.  Hypomagnesemia, hyponatremia. Currently treated. Monitor.  History of adrenal insufficiency pretension on steroids.  Initially was also stress dose steroids. Back to home regimen.  Monitor.  Chemotherapy induced pancytopenia. H&H stable.  Monitor. Appreciate oncology consultation. Resumed GM-CSF on 4/1.  Continue until ANC is more than 1500.  Metastatic non-small cell lung cancer. Outpatient follow-up with oncology again.  Type 2 diabetes mellitus. Hemoglobin A1c 9.2. Hyperglycemia most likely secondary to steroids. Outpatient follow-up with PCP recommended.  {Problem list (Optional):1}

## 2023-07-08 NOTE — Evaluation (Signed)
 Occupational Therapy Evaluation Patient Details Name: Brandon Rubio. MRN: [id number] DOB: January 01, 1951 Today's Date: 07/08/2023   History of Present Illness   73yo male admitted 07/05/23 with confusion and weakness. Episode of SVT vs VT, suspect due to PNA and sepsis. CXR: BLL opacties L>R. CTHead: atrophy and chronic small vessel ischemic changes, no acute process.   PMH: metastatic non-small cell lung cancer (last chemo 07/01/23), CAD, NSTEMI, cataracts, DM2, HLT, HTN, peripheral neuropathy, tobacco.     Clinical Impressions PTA, pt lived alone and reports being mod I for ADL. Upon eval, pt is oriented, following 1-2 step simple commands. Suspect some decr executive function at baseline given hx of chemo and DM. Pt pleasant and conversational throughout session. Pt needing up to min A for LB ADL. Presenting with decreased balance, cognition, and activity tolerance. Will continue to follow acutely and recommending HHOT at discharge.      If plan is discharge home, recommend the following:   A little help with walking and/or transfers;A little help with bathing/dressing/bathroom;Assistance with cooking/housework;Assist for transportation;Help with stairs or ramp for entrance     Functional Status Assessment   Patient has had a recent decline in their functional status and demonstrates the ability to make significant improvements in function in a reasonable and predictable amount of time.     Equipment Recommendations   BSC/3in1     Recommendations for Other Services   Speech consult     Precautions/Restrictions   Precautions Precautions: Fall Recall of Precautions/Restrictions: Impaired Precaution/Restrictions Comments: low WBC count, pt declined PT wearing mask as a protection Restrictions Weight Bearing Restrictions Per Provider Order: No     Mobility Bed Mobility Overal bed mobility: Needs Assistance Bed Mobility: Supine to Sit, Sit to Supine     Supine to  sit: Contact guard Sit to supine: Contact guard assist   General bed mobility comments: use of momentum for truncal elevation    Transfers Overall transfer level: Needs assistance Equipment used: Rolling walker (2 wheels) Transfers: Sit to/from Stand Sit to Stand: Contact guard assist                  Balance Overall balance assessment: Needs assistance Sitting-balance support: No upper extremity supported, Feet supported Sitting balance-Leahy Scale: Good     Standing balance support: Bilateral upper extremity supported, During functional activity Standing balance-Leahy Scale: Fair Standing balance comment: can stand statically without UE support, reliant on RW dynamically                           ADL either performed or assessed with clinical judgement   ADL Overall ADL's : Needs assistance/impaired Eating/Feeding: Modified independent;Sitting   Grooming: Contact guard assist;Standing   Upper Body Bathing: Set up;Sitting   Lower Body Bathing: Minimal assistance;Sit to/from stand   Upper Body Dressing : Set up;Sitting   Lower Body Dressing: Minimal assistance;Sit to/from stand   Toilet Transfer: Contact guard assist;Ambulation;Rolling walker (2 wheels)           Functional mobility during ADLs: Contact guard assist;Rolling walker (2 wheels)       Vision   Vision Assessment?: No apparent visual deficits     Perception         Praxis         Pertinent Vitals/Pain Pain Assessment Pain Assessment: Faces Faces Pain Scale: Hurts little more Pain Location: back Pain Descriptors / Indicators: Discomfort Pain Intervention(s): Limited activity within patient's tolerance, Monitored during  session     Extremity/Trunk Assessment Upper Extremity Assessment Upper Extremity Assessment: Overall WFL for tasks assessed   Lower Extremity Assessment Lower Extremity Assessment: Defer to PT evaluation   Cervical / Trunk Assessment Cervical /  Trunk Assessment: Normal   Communication Communication Communication: No apparent difficulties   Cognition Arousal: Alert Behavior During Therapy: WFL for tasks assessed/performed Cognition: No family/caregiver present to determine baseline             OT - Cognition Comments: oriented, pleasant and conversational.                 Following commands: Impaired Following commands impaired: Follows one step commands with increased time, Follows multi-step commands inconsistently     Cueing  General Comments   Cueing Techniques: Verbal cues  VSS. DOE 2/4 during short distance hall ambulation   Exercises     Shoulder Instructions      Home Living Family/patient expects to be discharged to:: Private residence Living Arrangements: Alone Available Help at Discharge: Friend(s);Available PRN/intermittently;Family Type of Home: House Home Access: Stairs to enter [employer] of Steps: 1 Entrance Stairs-Rails: None Home Layout: One level     Bathroom Shower/Tub: Producer, television/film/video: Standard Bathroom Accessibility: Yes How Accessible: Accessible via walker Home Equipment: Rolling Walker (2 wheels);Crutches          Prior Functioning/Environment Prior Level of Function : Independent/Modified Independent;Driving                    OT Problem List: Decreased strength;Decreased activity tolerance;Impaired balance (sitting and/or standing);Decreased safety awareness;Decreased knowledge of use of DME or AE   OT Treatment/Interventions: Self-care/ADL training;Therapeutic exercise;DME and/or AE instruction;Therapeutic activities;Patient/family education;Balance training      OT Goals(Current goals can be found in the care plan section)   Acute Rehab OT Goals Patient Stated Goal: go home OT Goal Formulation: With patient Time For Goal Achievement: 07/22/23 Potential to Achieve Goals: Good   OT Frequency:  Min 1X/week     Co-evaluation              AM-PAC OT "6 Clicks" Daily Activity     Outcome Measure Help from another person eating meals?: None Help from another person taking care of personal grooming?: A Little Help from another person toileting, which includes using toliet, bedpan, or urinal?: A Little Help from another person bathing (including washing, rinsing, drying)?: A Little Help from another person to put on and taking off regular upper body clothing?: A Little Help from another person to put on and taking off regular lower body clothing?: A Little 6 Click Score: 19   End of Session Equipment Utilized During Treatment: Gait belt;Rolling walker (2 wheels) Nurse Communication: Mobility status  Activity Tolerance: Patient tolerated treatment well Patient left: in bed;with bed alarm set;with call bell/phone within reach  OT Visit Diagnosis: Unsteadiness on feet (R26.81);Muscle weakness (generalized) (M62.81)                Time: 8469-6295 OT Time Calculation (min): 28 min Charges:  OT General Charges $OT Visit: 1 Visit OT Evaluation $OT Eval Moderate Complexity: 1 Mod OT Treatments $Self Care/Home Management : 8-22 mins  Karilyn Ouch, OTR/L [facility] Acute Rehabilitation Office: [phone]'   Brandon Rubio 07/08/2023, 5:47 PM

## 2023-07-08 NOTE — Progress Notes (Signed)
 [facility] Progress Note Patient: Brandon Rubio. ZOX:[id number] DOB: 18-Jun-1950 DOA: 07/05/2023  DOS: the patient was seen and examined on 07/08/2023  Brief Hospital Course: Patient with PMH of type II DM, HTN, HLD, adrenal insufficiency, non-small cell lung cancer on chemotherapy, presented to the hospital with complaints of lethargic event.  Currently being treated for possible pneumonia with sepsis.  Assessment and Plan: Concern for sepsis with pneumonia. CT chest abdomen pelvis shows evidence of groundglass opacities of bilaterally.  Left more than right. Procalcitonin level is negative. Currently managed with IV antibiotic. Will monitor. Sepsis physiology appears to have resolved for now. Blood cultures are negative so we will switch to oral antibiotics.  Lethargy with acute on metabolic encephalopathy. Likely in setting of infection. No focal deficit exam on my evaluation. PT OT consult. Monitor.  SVT. In the setting of sepsis. Cardiology was consulted. Was on amiodarone . Currently off of the medication.  On Lopressor .  Monitor. Echocardiogram shows 55 to 60% EF, also shows some wall motion abnormality grade 1 diastolic dysfunction. Outpatient follow-up with cardiology recommended.  Chronic back pain. On Norco at home. Currently receiving Percocet here in the hospital at a lower dose. Will monitor.  Hypomagnesemia, hyponatremia. Currently treated. Monitor.  History of adrenal insufficiency pretension on steroids.  Initially was also stress dose steroids. Back to home regimen.  Monitor.  Chemotherapy induced pancytopenia. H&H stable.  Monitor. Appreciate oncology consultation. Patient was actually initiated on Granix  until ANC more than 1500 although per oncology now currently holding off on GM-CSF.  Metastatic non-small cell lung cancer. Outpatient follow-up with oncology again.  Type 2 diabetes mellitus. Hemoglobin A1c 9.2. Hyperglycemia most  likely secondary to steroids. Outpatient follow-up with PCP recommended.     Subjective: No nausea no vomiting no fever no chills.  No chest pain.  Back pain is severe.  No fall no trauma.    Physical Exam: General: in Mild distress, No Rash Cardiovascular: S1 and S2 Present, No Murmur Respiratory: Good respiratory effort, Bilateral Air entry present.  Right-sided crackles, No wheezes Abdomen: Bowel Sound present, No tenderness Extremities: No edema Neuro: Alert and oriented x3, no new focal deficit  Data Reviewed: I have Reviewed nursing notes, Vitals, and Lab results. Since last encounter, pertinent lab results CBC and BMP   . I have ordered test including CBC and BMP  .  Disposition: Status is: Inpatient Remains inpatient appropriate because: Monitor for improvement in pneumonia  Place and maintain sequential compression device Start: 07/07/23 0826 SCDs Start: 07/05/23 2323   Family Communication: No one at bedside Level of care: Progressive switch to telemetry Vitals:   07/08/23 0500 07/08/23 0828 07/08/23 1110 07/08/23 1600  BP:  (!) 138/93 117/74 128/69  Pulse: 78 84 65 76  Resp: 20 20 16 18   Temp:  (!) 97.4 F (36.3 C) (!) 97.4 F (36.3 C) 98.3 F (36.8 C)  TempSrc:  Oral Oral Oral  SpO2: 96% 100% 95% 97%  Weight:      Height:         Author: Charlean Congress, MD 07/08/2023 7:57 PM  Please look on [email or web address] to find out who is on call.

## 2023-07-09 DIAGNOSIS — G9341 Metabolic encephalopathy: Secondary | ICD-10-CM | POA: Diagnosis not present

## 2023-07-09 DIAGNOSIS — D61818 Other pancytopenia: Secondary | ICD-10-CM | POA: Diagnosis not present

## 2023-07-09 DIAGNOSIS — C3491 Malignant neoplasm of unspecified part of right bronchus or lung: Secondary | ICD-10-CM | POA: Diagnosis not present

## 2023-07-09 DIAGNOSIS — R6520 Severe sepsis without septic shock: Secondary | ICD-10-CM | POA: Diagnosis not present

## 2023-07-09 DIAGNOSIS — A419 Sepsis, unspecified organism: Secondary | ICD-10-CM | POA: Diagnosis not present

## 2023-07-09 LAB — CBC WITH DIFFERENTIAL/PLATELET
Abs Immature Granulocytes: 0.04 10*3/uL (ref 0.00–0.07)
Basophils Absolute: 0 10*3/uL (ref 0.0–0.1)
Basophils Relative: 1 %
Eosinophils Absolute: 0 10*3/uL (ref 0.0–0.5)
Eosinophils Relative: 1 %
HCT: 29.9 % — ABNORMAL LOW (ref 39.0–52.0)
Hemoglobin: 9.8 g/dL — ABNORMAL LOW (ref 13.0–17.0)
Immature Granulocytes: 2 %
Lymphocytes Relative: 54 %
Lymphs Abs: 0.9 10*3/uL (ref 0.7–4.0)
MCH: 30.7 pg (ref 26.0–34.0)
MCHC: 32.8 g/dL (ref 30.0–36.0)
MCV: 93.7 fL (ref 80.0–100.0)
Monocytes Absolute: 0.3 10*3/uL (ref 0.1–1.0)
Monocytes Relative: 19 %
Neutro Abs: 0.4 10*3/uL — CL (ref 1.7–7.7)
Neutrophils Relative %: 23 %
Platelets: 83 10*3/uL — ABNORMAL LOW (ref 150–400)
RBC: 3.19 MIL/uL — ABNORMAL LOW (ref 4.22–5.81)
RDW: 15.9 % — ABNORMAL HIGH (ref 11.5–15.5)
Smear Review: DECREASED
WBC: 1.8 10*3/uL — ABNORMAL LOW (ref 4.0–10.5)
nRBC: 1.7 % — ABNORMAL HIGH (ref 0.0–0.2)

## 2023-07-09 LAB — GLUCOSE, CAPILLARY
Glucose-Capillary: 148 mg/dL — ABNORMAL HIGH (ref 70–99)
Glucose-Capillary: 194 mg/dL — ABNORMAL HIGH (ref 70–99)
Glucose-Capillary: 210 mg/dL — ABNORMAL HIGH (ref 70–99)
Glucose-Capillary: 183 mg/dL — ABNORMAL HIGH (ref 70–99)

## 2023-07-09 LAB — BASIC METABOLIC PANEL WITH GFR
Anion gap: 9 (ref 5–15)
BUN: 18 mg/dL (ref 8–23)
CO2: 20 mmol/L — ABNORMAL LOW (ref 22–32)
Calcium: 8.1 mg/dL — ABNORMAL LOW (ref 8.9–10.3)
Chloride: 110 mmol/L (ref 98–111)
Creatinine, Ser: 0.83 mg/dL (ref 0.61–1.24)
GFR, Estimated: >60 mL/min (ref >60)
Glucose, Bld: 155 mg/dL — ABNORMAL HIGH (ref 70–99)
Potassium: 4.2 mmol/L (ref 3.5–5.1)
Sodium: 139 mmol/L (ref 135–145)

## 2023-07-09 LAB — MAGNESIUM: Magnesium: 1.7 mg/dL (ref 1.7–2.4)

## 2023-07-09 LAB — PATHOLOGIST SMEAR REVIEW

## 2023-07-09 MED ORDER — FILGRASTIM-AAFI 480 MCG/0.8ML IJ SOSY
480.0000 ug | PREFILLED_SYRINGE | Freq: Every day | INTRAMUSCULAR | Status: DC
  Administered 2023-07-09 – 2023-07-10 (×2): 480 ug via SUBCUTANEOUS
  Filled 2023-07-09 (×2): qty 0.8

## 2023-07-09 MED ORDER — LEVOFLOXACIN 750 MG PO TABS
750.0000 mg | ORAL_TABLET | Freq: Every day | ORAL | Status: DC
  Administered 2023-07-09 – 2023-07-10 (×2): 750 mg via ORAL
  Filled 2023-07-09 (×2): qty 1

## 2023-07-09 MED ORDER — LORATADINE 10 MG PO TABS
10.0000 mg | ORAL_TABLET | Freq: Every day | ORAL | Status: DC
  Administered 2023-07-09 – 2023-07-10 (×2): 10 mg via ORAL
  Filled 2023-07-09 (×2): qty 1

## 2023-07-09 NOTE — Progress Notes (Signed)
 [facility] Progress Note Patient: Brandon Rubio. WJX:[id number] DOB: [birth year] DOA: 07/05/2023  DOS: the patient was seen and examined on 07/09/2023  Brief Hospital Course: Patient with PMH of type II DM, HTN, HLD, adrenal insufficiency, non-small cell lung cancer on chemotherapy, presented to the hospital with complaints of lethargic event.  Currently being treated for possible pneumonia with sepsis.  Assessment and Plan: Concern for sepsis with pneumonia. CT chest abdomen pelvis shows evidence of groundglass opacities of bilaterally.  Left more than right. Procalcitonin level is negative. Sepsis physiology appears to have resolved for now. Blood cultures are negative Switch to oral antibiotic.  Levaquin .  Lethargy with acute on metabolic encephalopathy. Likely in setting of infection. No focal deficit exam on my evaluation. PT OT consult. Monitor.  SVT. In the setting of sepsis. Cardiology was consulted. Was on amiodarone . Currently off of the medication.  On Lopressor .  Monitor. Echocardiogram shows 55 to 60% EF, also shows some wall motion abnormality grade 1 diastolic dysfunction. Outpatient follow-up with cardiology recommended.  Chronic back pain. On Norco at home. Currently receiving Percocet here in the hospital at a lower dose. Will monitor.  Hypomagnesemia, hyponatremia. Currently treated. Monitor.  History of adrenal insufficiency pretension on steroids.  Initially was also stress dose steroids. Back to home regimen.  Monitor.  Chemotherapy induced pancytopenia. H&H stable.  Monitor. Appreciate oncology consultation. Resumed GM-CSF on 4/1.  Continue until ANC is more than 1500.  Metastatic non-small cell lung cancer. Outpatient follow-up with oncology again.  Type 2 diabetes mellitus. Hemoglobin A1c 9.2. Hyperglycemia most likely secondary to steroids. Outpatient follow-up with PCP recommended.      Subjective: No nausea or vomiting.   Continues to have back pain and shoulder pain.  No fever no chills.  Physical Exam: General: in Mild distress, No Rash Cardiovascular: S1 and S2 Present, No Murmur Respiratory: Good respiratory effort, Bilateral Air entry present.  Bilateral basal crackles, No wheezes Abdomen: Bowel Sound present, No tenderness Extremities: No edema Neuro: Alert and oriented x3, no new focal deficit  Data Reviewed: I have Reviewed nursing notes, Vitals, and Lab results. Since last encounter, pertinent lab results CBC and BMP   . I have ordered test including CBC and BMP  . I have discussed pt's care plan and test results with oncology  .   Disposition: Status is: Inpatient Remains inpatient appropriate because: Requiring GM-CSF therapy.  Place and maintain sequential compression device Start: 07/07/23 0826 SCDs Start: 07/05/23 2323   Family Communication: No one at bedside Level of care: Telemetry Medical   Vitals:   07/09/23 0700 07/09/23 0800 07/09/23 1115 07/09/23 1618  BP: 111/70 121/79 131/75 135/74  Pulse: 67 76 76 78  Resp: 16 12 14 15   Temp: 97.8 F (36.6 C)  (!) 97.4 F (36.3 C) (!) 97.4 F (36.3 C)  TempSrc:   Oral Oral  SpO2: 97% 96% 93% 94%  Weight:      Height:         Author: Charlean Congress, MD 07/09/2023 8:08 PM  Please look on [email or web address] to find out who is on call.

## 2023-07-09 NOTE — Care Management Important Message (Signed)
 Important Message  Patient Details  Name: Brandon Rubio. MRN: [id number] Date of Birth: [birth year]   Important Message Given:  Yes - Medicare IM     Janith Melnick 07/09/2023, 11:12 AM

## 2023-07-09 NOTE — Progress Notes (Addendum)
 Received critical from lab neut # 0.4.

## 2023-07-09 NOTE — Progress Notes (Signed)
 Physical Therapy Treatment Patient Details Name: Brandon Rubio. MRN: [id number] DOB: January 28, 1951 Today's Date: 07/09/2023   History of Present Illness 73yo male admitted 07/05/23 with confusion and weakness. Episode of SVT vs VT, suspect due to PNA and sepsis. CXR: BLL opacties L>R. CTHead: atrophy and chronic small vessel ischemic changes, no acute process.   PMH: metastatic non-small cell lung cancer (last chemo 07/01/23), CAD, NSTEMI, cataracts, DM2, HLT, HTN, peripheral neuropathy, tobacco.    PT Comments  Pt resting in bed on arrival, pleasant and agreeable to therapy session, continuing to make progress twoard acute goals. Pt with increased tolerance this session, progressing gait distance with RW support and grossly CGA for safety, with cues to keep RW on the ground, especially with turns. Pt able to statically stand for ~22mins at sink to perform ADLs inside the RW, without UE support, no LOB, and CGA for safety. Educated pt on pacing and continued RW use after discharge for safety, with pt verbalizing understanding. Pt continues to benefit from skilled PT services to progress toward functional mobility goals, will continue to follow acutely.    If plan is discharge home, recommend the following: A little help with walking and/or transfers;A little help with bathing/dressing/bathroom   Can travel by private vehicle        Equipment Recommendations  None recommended by PT    Recommendations for Other Services       Precautions / Restrictions Precautions Precautions: Fall Recall of Precautions/Restrictions: Impaired Precaution/Restrictions Comments: low WBC count, pt declined PT wearing mask as a protection Restrictions Weight Bearing Restrictions Per Provider Order: No     Mobility  Bed Mobility Overal bed mobility: Needs Assistance Bed Mobility: Supine to Sit, Sit to Supine     Supine to sit: Contact guard Sit to supine: Contact guard assist   General bed mobility  comments: CGA for saftey    Transfers Overall transfer level: Needs assistance Equipment used: Rolling walker (2 wheels) Transfers: Sit to/from Stand Sit to Stand: Contact guard assist           General transfer comment: CGA for saftey, no LOB on rise    Ambulation/Gait Ambulation/Gait assistance: Supervision Gait Distance (Feet): 250 Feet Assistive device: Rolling walker (2 wheels) Gait Pattern/deviations: Step-through pattern, Decreased stride length, Trunk flexed Gait velocity: decr     General Gait Details: supervision for saftey, pt with tendency to lift walker off of ground with turns, cues to correct. SpO2 >95% on RA throughout gait   Stairs             Wheelchair Mobility     Tilt Bed    Modified Rankin (Stroke Patients Only)       Balance Overall balance assessment: Needs assistance Sitting-balance support: No upper extremity supported, Feet supported Sitting balance-Leahy Scale: Good     Standing balance support: Bilateral upper extremity supported, During functional activity Standing balance-Leahy Scale: Fair Standing balance comment: can stand statically without UE support, reliant on RW dynamically                            Communication Communication Communication: No apparent difficulties  Cognition Arousal: Alert Behavior During Therapy: WFL for tasks assessed/performed   PT - Cognitive impairments: No family/caregiver present to determine baseline                         Following commands: Impaired Following commands impaired: Follows one  step commands with increased time, Follows multi-step commands inconsistently    Cueing Cueing Techniques: Verbal cues  Exercises      General Comments General comments (skin integrity, edema, etc.): VSS on RA      Pertinent Vitals/Pain Pain Assessment Pain Assessment: No/denies pain Pain Intervention(s): Monitored during session    Home Living                           Prior Function            PT Goals (current goals can now be found in the care plan section) Acute Rehab PT Goals PT Goal Formulation: With patient Time For Goal Achievement: 07/21/23 Progress towards PT goals: Progressing toward goals    Frequency    Min 3X/week      PT Plan      Co-evaluation              AM-PAC PT "6 Clicks" Mobility   Outcome Measure  Help needed turning from your back to your side while in a flat bed without using bedrails?: A Little Help needed moving from lying on your back to sitting on the side of a flat bed without using bedrails?: A Little Help needed moving to and from a bed to a chair (including a wheelchair)?: A Little Help needed standing up from a chair using your arms (e.g., wheelchair or bedside chair)?: A Little Help needed to walk in hospital room?: A Little Help needed climbing 3-5 steps with a railing? : A Lot 6 Click Score: 17    End of Session Equipment Utilized During Treatment: Gait belt Activity Tolerance: Patient tolerated treatment well Patient left: in bed;with call bell/phone within reach;with bed alarm set;with nursing/sitter in room (PA in room) Nurse Communication: Mobility status PT Visit Diagnosis: Other abnormalities of gait and mobility (R26.89);Muscle weakness (generalized) (M62.81)     Time: 6045-4098 PT Time Calculation (min) (ACUTE ONLY): 19 min  Charges:    $Gait Training: 8-22 mins PT General Charges $$ ACUTE PT VISIT: 1 Visit                     Forrest Iha 07/09/2023, 4:16 PM

## 2023-07-09 NOTE — Progress Notes (Signed)
 Brandon Rubio.   DOB:08-Apr-1950   ZO#:[id number]      ASSESSMENT & PLAN:  Metastatic non-small cell lung cancer, adenocarcinoma - Diagnosed October 2022.  Disease progression December 2022. - Status post neoadjuvant treatment with carbo, Alimta, and nivolumab  every 3 weeks.  Concurrent chemoradiation administered.  Subsequent treatment with immunotherapy/Imfinzi  every 4 weeks. - Given second line systemic chemo with docetaxel  and ramucirumab  every 3 weeks, received first dose 04/08/2023. - Neulasta  injections were discontinued due to poor tolerance. - Medical oncology/Dr. Marguerita Rubio following.  Patient last seen in outpatient oncology office on 07/01/2023.   Pancytopenia: Leukopenia -WBC remains low 1.8, ANC 0.4 today.  Likely secondary to therapy.  Okay to give GCS-F 480 mcg daily, discussed with patient and he agrees to receive it, of note he has had poor tolerance in the past.  Please give loratidine as may help with symptoms of GCS-F.   Anemia - low 9.8, no transfusional intervention indicated, continue to monitor CBC with differential  Thrombocytopenia - low platelets 83k, no transfusional intervention required.   Generalized weakness - Secondary to malignancy and comorbidities - Continue PT/OT - Continue supportive care      Code Status Full  Subjective:  Patient seen awake alert and oriented x 3, just completed session with PT.  Discussed labs with patient he agrees to receive G-CSF if it will "help to raise my white count".  No other distress noted.  Objective:  Vitals:   07/09/23 0800 07/09/23 1115  BP: 121/79 131/75  Pulse: 76 76  Resp: 12 14  Temp:  (!) 97.4 F (36.3 C)  SpO2: 96% 93%     Intake/Output Summary (Last 24 hours) at 07/09/2023 1352 Last data filed at 07/09/2023 0641 Gross per 24 hour  Intake 780 ml  Output 1450 ml  Net -670 ml     PHYSICAL EXAMINATION: ECOG PERFORMANCE STATUS: 3 - Symptomatic, >50% confined to bed  Vitals:   07/09/23 0800  07/09/23 1115  BP: 121/79 131/75  Pulse: 76 76  Resp: 12 14  Temp:  (!) 97.4 F (36.3 C)  SpO2: 96% 93%   Filed Weights   07/05/23 1757  Weight: 200 lb (90.7 kg)    GENERAL: alert, no distress and comfortable SKIN: + Pale skin color, texture, turgor are normal, no rashes or significant lesions EYES: normal, conjunctiva are pink and non-injected, sclera clear OROPHARYNX: no exudate, no erythema and lips, buccal mucosa, and tongue normal  NECK: supple, thyroid  normal size, non-tender, without nodularity LYMPH: no palpable lymphadenopathy in the cervical, axillary or inguinal LUNGS: clear to auscultation and percussion with normal breathing effort HEART: regular rate & rhythm and no murmurs and no lower extremity edema ABDOMEN: abdomen soft, non-tender and normal bowel sounds MUSCULOSKELETAL: +difficulty ambulating, no cyanosis of digits and no clubbing  PSYCH: alert & oriented x 3 with fluent speech NEURO: no focal motor/sensory deficits   All questions were answered. The patient knows to call the clinic with any problems, questions or concerns.   The total time spent in the appointment was 40 minutes encounter with patient including review of chart and various tests results, discussions about plan of care and coordination of care plan  Brandon Mate, NP 07/09/2023 1:52 PM    Labs Reviewed:  Lab Results  Component Value Date   WBC 1.8 (L) 07/09/2023   HGB 9.8 (L) 07/09/2023   HCT 29.9 (L) 07/09/2023   MCV 93.7 07/09/2023   PLT 83 (L) 07/09/2023   Recent Labs  07/01/23 0901 07/05/23 1744 07/06/23 0320 07/07/23 0332 07/08/23 0308 07/09/23 0335  NA 137 133* 133* 139 139 139  K 4.5 3.9 4.3 3.8 3.3* 4.2  CL 105 98 103 110 111 110  CO2 21* 23 18* 20* 21* 20*  GLUCOSE 438* 178* 232* 185* 275* 155*  BUN 19 15 13 21 23 18   CREATININE 1.00 1.07 0.89 0.98 1.09 0.83  CALCIUM  8.3* 8.6* 8.1* 8.1* 7.9* 8.1*  GFRNONAA >60 >60 >60 >60 >60 >60  PROT 6.3* 6.3* 5.1*  --   --    --   ALBUMIN  3.7 2.7* 2.2*  --   --   --   AST 19 26 22   --   --   --   ALT 30 30 25   --   --   --   ALKPHOS 57 50 43  --   --   --   BILITOT 0.5 1.3* 1.2  --   --   --     Studies Reviewed:  ECHOCARDIOGRAM COMPLETE Result Date: 07/07/2023    ECHOCARDIOGRAM REPORT   Patient Name:   Brandon Rubio. Date of Exam: 07/07/2023 Medical Rec #:  [id number]             Height:       72.0 in Accession #:    [id number]            Weight:       200.0 lb Date of Birth:  February 10, 1951              BSA:          2.131 m Patient Age:    73 years              BP:           129/80 mmHg Patient Gender: M                     HR:           81 bpm. Exam Location:  Inpatient Procedure: 2D Echo (Both Spectral and Color Flow Doppler were utilized during            procedure). Indications:    dyspnea  History:        Patient has prior history of Echocardiogram examinations, most                 recent 10/06/2022. CAD, lung cancer, Signs/Symptoms:Dyspnea; Risk                 Factors:Hypertension, Dyslipidemia, Diabetes and Current Smoker.  Sonographer:    Dione Franks RDCS Referring Phys: [id number] TAYLOR A PARCELLS IMPRESSIONS  1. No left ventricular thrombus is seen. Left ventricular ejection fraction, by estimation, is 55 to 60%. The left ventricle has normal function. The left ventricle demonstrates regional wall motion abnormalities (see scoring diagram/findings for description). Left ventricular diastolic parameters are consistent with Grade I diastolic dysfunction (impaired relaxation). There is moderate hypokinesis of the left ventricular, apical septal wall, anterior wall and inferior wall.  2. Right ventricular systolic function is normal. The right ventricular size is normal. There is normal pulmonary artery systolic pressure.  3. The mitral valve is normal in structure. No evidence of mitral valve regurgitation. No evidence of mitral stenosis.  4. The aortic valve is tricuspid. There is mild calcification of the  aortic valve. Aortic valve regurgitation is not visualized. Aortic valve sclerosis is present, with no evidence of aortic valve stenosis.  5. There is borderline dilatation of the ascending aorta, measuring 38 mm.  6. The inferior vena cava is normal in size with greater than 50% respiratory variability, suggesting right atrial pressure of 3 mmHg. Comparison(s): No significant change from prior study. Prior images reviewed side by side. Retrospectively, the area of apical hypokinesis, consistent with distal LAD artery territory infarction, was also present on the 2023 and 2024 studies. FINDINGS  Left Ventricle: No left ventricular thrombus is seen. Left ventricular ejection fraction, by estimation, is 55 to 60%. The left ventricle has normal function. The left ventricle demonstrates regional wall motion abnormalities. Moderate hypokinesis of the left ventricular, apical septal wall, anterior wall and inferior wall. Definity  contrast agent was given IV to delineate the left ventricular endocardial borders. The left ventricular internal cavity size was normal in size. There is no left ventricular hypertrophy. Left ventricular diastolic parameters are consistent with Grade I diastolic dysfunction (impaired relaxation). Normal left ventricular filling pressure.  LV Wall Scoring: The apical septal segment, apical inferior segment, and apex are hypokinetic. Right Ventricle: The right ventricular size is normal. No increase in right ventricular wall thickness. Right ventricular systolic function is normal. There is normal pulmonary artery systolic pressure. The tricuspid regurgitant velocity is 2.20 m/s, and  with an assumed right atrial pressure of 3 mmHg, the estimated right ventricular systolic pressure is 22.4 mmHg. Left Atrium: Left atrial size was normal in size. Right Atrium: Right atrial size was normal in size. Pericardium: There is no evidence of pericardial effusion. Mitral Valve: The mitral valve is normal in  structure. No evidence of mitral valve regurgitation. No evidence of mitral valve stenosis. Tricuspid Valve: The tricuspid valve is normal in structure. Tricuspid valve regurgitation is trivial. Aortic Valve: The aortic valve is tricuspid. There is mild calcification of the aortic valve. Aortic valve regurgitation is not visualized. Aortic valve sclerosis is present, with no evidence of aortic valve stenosis. Pulmonic Valve: The pulmonic valve was not well visualized. Pulmonic valve regurgitation is not visualized. No evidence of pulmonic stenosis. Aorta: The aortic root is normal in size and structure. There is borderline dilatation of the ascending aorta, measuring 38 mm. Venous: The inferior vena cava is normal in size with greater than 50% respiratory variability, suggesting right atrial pressure of 3 mmHg. IAS/Shunts: The interatrial septum was not well visualized.  LEFT VENTRICLE PLAX 2D LVIDd:         4.60 cm   Diastology LVIDs:         3.20 cm   LV e' medial:    5.22 cm/s LV PW:         1.10 cm   LV E/e' medial:  9.8 LV IVS:        1.00 cm   LV e' lateral:   5.98 cm/s LVOT diam:     2.20 cm   LV E/e' lateral: 8.6 LV SV:         79 LV SV Index:   37 LVOT Area:     3.80 cm  RIGHT VENTRICLE RV S prime:     11.60 cm/s LEFT ATRIUM             Index LA diam:        3.80 cm 1.78 cm/m LA Vol (A2C):   35.8 ml 16.80 ml/m LA Vol (A4C):   62.7 ml 29.43 ml/m LA Biplane Vol: 50.8 ml 23.84 ml/m  AORTIC VALVE LVOT Vmax:   104.00 cm/s LVOT Vmean:  67.100 cm/s LVOT VTI:  0.207 m  AORTA Ao Root diam: 3.70 cm Ao Asc diam:  3.80 cm MITRAL VALVE               TRICUSPID VALVE MV Area (PHT): 3.42 cm    TR Peak grad:   19.4 mmHg MV Decel Time: 222 msec    TR Vmax:        220.00 cm/s MV E velocity: 51.40 cm/s MV A velocity: 63.90 cm/s  SHUNTS MV E/A ratio:  0.80        Systemic VTI:  0.21 m                            Systemic Diam: 2.20 cm Karyl Paget Croitoru MD Electronically signed by Luana Rumple MD Signature Date/Time:  07/07/2023/10:57:12 AM    Final    CT CHEST ABDOMEN PELVIS W CONTRAST Result Date: 07/06/2023 CLINICAL DATA:  Pneumonia, complications suspected, with abdominal pain, history of non-small cell lung cancer with treatment, sepsis workup. EXAM: CT CHEST, ABDOMEN, AND PELVIS WITH CONTRAST TECHNIQUE: Multidetector CT imaging of the chest, abdomen and pelvis was performed following the standard protocol during bolus administration of intravenous contrast. RADIATION DOSE REDUCTION: This exam was performed according to the departmental dose-optimization program which includes automated exposure control, adjustment of the mA and/or kV according to patient size and/or use of iterative reconstruction technique. CONTRAST:  75mL OMNIPAQUE  IOHEXOL  350 MG/ML SOLN COMPARISON:  CT chest, abdomen and pelvis without contrast earlier today at 1:03 a.m., and CT chest, abdomen and pelvis with IV contrast 06/03/2023. FINDINGS: CT CHEST FINDINGS Cardiovascular: The cardiac size is normal. There is patchy left main and three-vessel coronary artery calcification. No pericardial effusion. There is trace air anteriorly in the pulmonary trunk and right ventricle probably iatrogenic. Aortic and minimal great vessel atherosclerosis without aneurysm, stenosis or dissection. The great vessels are widely patent. The pulmonary arteries and veins are normal in caliber. The pulmonary arteries are centrally clear. Mediastinum/Nodes: No enlarged intrathoracic or axillary lymph nodes. The trachea and main bronchi are clear. Unremarkable thyroid  gland, thoracic esophagus. Lungs/Pleura: Small right and minimal left layering pleural effusions, unchanged. Centrilobular and paraseptal emphysematous changes are again noted, predominating in the upper lobes. Multifocal ground-glass nodules/masses on the left greater than right consistent with multifocal adenocarcinoma, are again noted. Largest again measuring 6.5 x 5.1 cm in the left lower lobe (4: 81 and 84).  Volume loss and sizable rounded consolidation in the right lower lobe with calcifications appears similar to prior studies, consistent with treated neoplasm and possible chronic aspiration component. There are again small ground-glass nodules in the nonconsolidated portion of the right lower lobe. Additional ground-glass nodules in the left upper lobe most confluent at the hilar structures, for example measuring 3.8 x 3.5 cm on 4:72, previously 3.4 x 1 7 cm. There is a ground-glass nodule in the posterior right upper lobe today measuring 1.8 cm on 4:88, 1 month ago was 1.6 cm. No acute airspace infiltrate is seen. Musculoskeletal: No regional bone metastasis is seen. CT ABDOMEN PELVIS FINDINGS Hepatobiliary: The liver is mildly steatotic. No mass is seen. Gallbladder is contracted but without inflammatory changes or stones. No biliary dilatation. Pancreas: No abnormality. Spleen: No abnormality. Adrenals/Urinary Tract: There is no adrenal mass. No mass enhancement in either kidney. There is chronic perinephric stranding which appears stable. There is a 3.7 cm Bosniak 1 cyst in the posterior left kidney, Hounsfield density is -0.6. No follow-up imaging is recommended. There  is no urinary stone or obstruction. The bladder is unremarkable. Stomach/Bowel: No bowel dilatation or wall thickening including the appendix. There is fluid in the colon compatible with diarrhea. There are chronic thickened folds in the stomach. Vascular/Lymphatic: Aortic atherosclerosis. No enlarged abdominal or pelvic lymph nodes. 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 No AAA. Reproductive: Prostate is unremarkable. Other: Minimal ascites, new from last month. No free air, free hemorrhage, abscess or incarcerated hernia. Musculoskeletal: No regional bone metastasis is seen. IMPRESSION: 1. No acute chest CT findings. No findings in the abdomen and pelvis suggesting a site for sepsis. 2. Stable small right and minimal  left pleural effusions. 3. Emphysema. 4. Multifocal ground-glass nodules/masses on the left greater than right consistent with multifocal adenocarcinoma. Some lesions progressive from 06/03/2023. 5. Stable volume loss and rounded consolidation in the right lower lobe with calcifications, consistent with treated neoplasm and possible chronic aspiration component. 6. Increased size of ground-glass nodules in the left and right upper lobe. 7. Aortic and coronary artery atherosclerosis. 8. Minimal ascites, new from last month. 9. Fluid in the colon compatible with diarrhea. No bowel obstruction or inflammation. 10. Chronic thickened folds in the stomach. 11. 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 Aortic Atherosclerosis (ICD10-I70.0) and Emphysema (ICD10-J43.9). Electronically Signed   By: Denman Fischer M.D.   On: 07/06/2023 22:46   CT CHEST ABDOMEN PELVIS WO CONTRAST Result Date: 07/06/2023 EXAM: CT CHEST, ABDOMEN AND PELVIS WITHOUT CONTRAST 07/06/2023 01:19:14 AM TECHNIQUE: CT of the chest, abdomen and pelvis was performed Multiplanar reformatted images are provided for review. Automated exposure control, iterative reconstruction, and/or weight based adjustment of the mA/kV was utilized to reduce the radiation dose to as low as reasonably achievable. COMPARISON: 06/03/2023 CLINICAL HISTORY: Pneumonia, complication suspected, xray done; Abdominal pain, acute, nonlocalized. History of lung cancer. FINDINGS: MEDIASTINUM: Mild 3-vessel coronary atherosclerosis. Mild atherosclerotic calcification of the aortic arch. LYMPH NODES: No evidence of mediastinal, hilar or axillary lymphadenopathy. LUNGS AND PLEURA: Radiation changes/scarring in the right lower lobe with suspected superimposed aspiration, chronic. Scattered multifocal ground glass nodules/masses, measuring up to 5.7 cm in the left lower lobe (image 72), grossly unchanged and compatible with known multifocal adenocarcinoma. Mild  centrilobular and paraseptal emphysematous changes, upper lung predominant. Trace right pleural effusion, chronic. HEPATOBILIARY: The liver is unremarkable. Gallbladder is unremarkable. No biliary ductal dilatation. SPLEEN: No acute abnormality. PANCREAS: No acute abnormality. ADRENAL GLANDS: No acute abnormality. KIDNEYS, URETERS AND BLADDER: 3.6 cm simple left renal cyst (image 53), benign, no follow-up is recommended. No stones in the kidneys or ureters. No evidence of hydronephrosis. No evidence of perinephric or periureteral stranding. GI AND BOWEL: Stomach demonstrates no acute abnormality. There is no evidence of bowel obstruction. No evidence of appendicitis. REPRODUCTIVE: Reproductive organs are unremarkable. PERITONEUM AND RETROPERITONEUM: No evidence of ascites or free air. Atherosclerotic calcifications of the abdominal aorta and branch vessels. LYMPH NODES: No evidence of lymphadenopathy. BONES AND SOFT TISSUES: No acute osseous abnormality. No focal soft tissue abnormality. Incidental adrenal and/or renal findings do not require follow up imaging. IMPRESSION: 1. Stable multifocal adenocarcinoma. 2. Radiation changes/scarring in the right lower lobe with suspected superimposed aspiration. 3. Trace right pleural effusion, chronic. 4. No acute findings in the abdomen/pelvis. Electronically signed by: Zadie Herter MD 07/06/2023 01:31 AM EDT RP Workstation: [id number]   CT Head Wo Contrast Result Date: 07/05/2023 CLINICAL DATA:  Mental status change, unknown cause EXAM: CT HEAD WITHOUT CONTRAST TECHNIQUE: Contiguous axial images were  obtained from the base of the skull through the vertex without intravenous contrast. RADIATION DOSE REDUCTION: This exam was performed according to the departmental dose-optimization program which includes automated exposure control, adjustment of the mA and/or kV according to patient size and/or use of iterative reconstruction technique. COMPARISON:  05/10/2021.  FINDINGS: Brain: There is periventricular white matter decreased attenuation consistent with small vessel ischemic changes. Ventricles, sulci and cisterns are prominent consistent with age related involutional changes. No acute intracranial hemorrhage, mass effect or shift. No hydrocephalus. Vascular: No hyperdense vessel or unexpected calcification. Skull: Normal. Negative for fracture or focal lesion. Sinuses/Orbits: No acute finding. IMPRESSION: Atrophy and chronic small vessel ischemic changes. No acute intracranial process identified. Electronically Signed   By: Sydell Eva M.D.   On: 07/05/2023 19:58   DG Chest Portable 1 View Result Date: 07/05/2023 CLINICAL DATA:  Altered mental status.  Found down. EXAM: PORTABLE CHEST 1 VIEW COMPARISON:  10/02/2022. FINDINGS: Dense consolidation at the left base is somewhat mass-like appearing. The this could represent pneumonia and follow up is recommended to confirm resolution. There is linear subsegmental atelectasis or scarring in the right perihilar region. There is density consistent with consolidation or volume loss in the medial right base. No pneumothorax. No definite pleural effusion. Normal pulmonary vasculature. IMPRESSION: Bibasilar opacities, left greater than right. Follow-up recommended to confirm resolution. Electronically Signed   By: Sydell Eva M.D.   On: 07/05/2023 19:56

## 2023-07-10 ENCOUNTER — Other Ambulatory Visit: Payer: Self-pay

## 2023-07-10 ENCOUNTER — Inpatient Hospital Stay (HOSPITAL_COMMUNITY)

## 2023-07-10 DIAGNOSIS — R4182 Altered mental status, unspecified: Secondary | ICD-10-CM | POA: Diagnosis not present

## 2023-07-10 DIAGNOSIS — R569 Unspecified convulsions: Secondary | ICD-10-CM

## 2023-07-10 LAB — COMPREHENSIVE METABOLIC PANEL WITH GFR
ALT: 21 U/L (ref 0–44)
AST: 20 U/L (ref 15–41)
Albumin: 2.3 g/dL — ABNORMAL LOW (ref 3.5–5.0)
Alkaline Phosphatase: 44 U/L (ref 38–126)
Anion gap: 11 (ref 5–15)
BUN: 14 mg/dL (ref 8–23)
CO2: 22 mmol/L (ref 22–32)
Calcium: 8.5 mg/dL — ABNORMAL LOW (ref 8.9–10.3)
Chloride: 103 mmol/L (ref 98–111)
Creatinine, Ser: 0.98 mg/dL (ref 0.61–1.24)
GFR, Estimated: >60 mL/min (ref >60)
Glucose, Bld: 134 mg/dL — ABNORMAL HIGH (ref 70–99)
Potassium: 3.7 mmol/L (ref 3.5–5.1)
Sodium: 136 mmol/L (ref 135–145)
Total Bilirubin: 0.5 mg/dL (ref 0.0–1.2)
Total Protein: 5.4 g/dL — ABNORMAL LOW (ref 6.5–8.1)

## 2023-07-10 LAB — CULTURE, BLOOD (ROUTINE X 2)
Culture: NO GROWTH
Culture: NO GROWTH
Special Requests: ADEQUATE

## 2023-07-10 LAB — CBC WITH DIFFERENTIAL/PLATELET
Abs Immature Granulocytes: 0 10*3/uL (ref 0.00–0.07)
Basophils Absolute: 0 10*3/uL (ref 0.0–0.1)
Basophils Relative: 1 %
Blasts: 1 %
Eosinophils Absolute: 0 10*3/uL (ref 0.0–0.5)
Eosinophils Relative: 0 %
HCT: 34.5 % — ABNORMAL LOW (ref 39.0–52.0)
Hemoglobin: 11.3 g/dL — ABNORMAL LOW (ref 13.0–17.0)
Lymphocytes Relative: 28 %
Lymphs Abs: 1.3 10*3/uL (ref 0.7–4.0)
MCH: 30.8 pg (ref 26.0–34.0)
MCHC: 32.8 g/dL (ref 30.0–36.0)
MCV: 94 fL (ref 80.0–100.0)
Monocytes Absolute: 0.6 10*3/uL (ref 0.1–1.0)
Monocytes Relative: 13 %
Neutro Abs: 2.6 10*3/uL (ref 1.7–7.7)
Neutrophils Relative %: 57 %
Platelets: 99 10*3/uL — ABNORMAL LOW (ref 150–400)
RBC: 3.67 MIL/uL — ABNORMAL LOW (ref 4.22–5.81)
RDW: 15.9 % — ABNORMAL HIGH (ref 11.5–15.5)
WBC: 4.5 10*3/uL (ref 4.0–10.5)
nRBC: 4.2 % — ABNORMAL HIGH (ref 0.0–0.2)
nRBC: 6 /100{WBCs} — ABNORMAL HIGH

## 2023-07-10 LAB — GLUCOSE, CAPILLARY
Glucose-Capillary: 152 mg/dL — ABNORMAL HIGH (ref 70–99)
Glucose-Capillary: 140 mg/dL — ABNORMAL HIGH (ref 70–99)
Glucose-Capillary: 176 mg/dL — ABNORMAL HIGH (ref 70–99)
Glucose-Capillary: 126 mg/dL — ABNORMAL HIGH (ref 70–99)

## 2023-07-10 LAB — AMMONIA: Ammonia: 22 umol/L (ref 9–35)

## 2023-07-10 LAB — PROCALCITONIN: Procalcitonin: 0.57 ng/mL

## 2023-07-10 LAB — TSH: TSH: 2.588 u[IU]/mL (ref 0.350–4.500)

## 2023-07-10 LAB — C-REACTIVE PROTEIN: CRP: 7.2 mg/dL — ABNORMAL HIGH (ref <1.0)

## 2023-07-10 LAB — SEDIMENTATION RATE: Sed Rate: 81 mm/h — ABNORMAL HIGH (ref 0–16)

## 2023-07-10 LAB — MAGNESIUM: Magnesium: 1.4 mg/dL — ABNORMAL LOW (ref 1.7–2.4)

## 2023-07-10 LAB — T4, FREE: Free T4: 1.46 ng/dL — ABNORMAL HIGH (ref 0.61–1.12)

## 2023-07-10 LAB — VITAMIN B12: Vitamin B-12: 718 pg/mL (ref 180–914)

## 2023-07-10 MED ORDER — NICOTINE 14 MG/24HR TD PT24
14.0000 mg | MEDICATED_PATCH | Freq: Every day | TRANSDERMAL | Status: DC
  Administered 2023-07-10 – 2023-07-12 (×3): 14 mg via TRANSDERMAL
  Filled 2023-07-10 (×3): qty 1

## 2023-07-10 MED ORDER — MELATONIN 5 MG PO TABS
5.0000 mg | ORAL_TABLET | Freq: Every day | ORAL | Status: DC
  Administered 2023-07-10 – 2023-07-11 (×2): 5 mg via ORAL
  Filled 2023-07-10 (×2): qty 1

## 2023-07-10 MED ORDER — MAGNESIUM SULFATE 4 GM/100ML IV SOLN
4.0000 g | Freq: Once | INTRAVENOUS | Status: AC
  Administered 2023-07-10: 4 g via INTRAVENOUS
  Filled 2023-07-10: qty 100

## 2023-07-10 MED ORDER — HALOPERIDOL LACTATE 5 MG/ML IJ SOLN: 2.0000 mg | Freq: Every evening | INTRAMUSCULAR | Status: DC | PRN

## 2023-07-10 MED ORDER — IPRATROPIUM-ALBUTEROL 0.5-2.5 (3) MG/3ML IN SOLN
3.0000 mL | Freq: Four times a day (QID) | RESPIRATORY_TRACT | Status: DC | PRN
  Administered 2023-07-10: 3 mL via RESPIRATORY_TRACT
  Filled 2023-07-10: qty 3

## 2023-07-10 MED ORDER — ENSURE ENLIVE PO LIQD
237.0000 mL | Freq: Three times a day (TID) | ORAL | Status: DC
  Administered 2023-07-10: 237 mL via ORAL

## 2023-07-10 MED ORDER — SACCHAROMYCES BOULARDII 250 MG PO CAPS: 250.0000 mg | ORAL_CAPSULE | Freq: Two times a day (BID) | ORAL | Status: DC

## 2023-07-10 NOTE — Progress Notes (Signed)
 Arrived to room pt unavailable due to patient care. Spoke with RN to contact me when they are done. Will try back for EEG as schedule permits.

## 2023-07-10 NOTE — Progress Notes (Signed)
 Pt has been increasingly confused during the night, forgets what I have told an hour later.

## 2023-07-10 NOTE — Procedures (Signed)
 Patient Name: Brandon Rubio.  MRN: [id number]  Epilepsy Attending: Arleene Lack  Referring Physician/Provider: Kraig Peru, MD  Date: 07/10/2023 Duration: 23.29 mins  Patient history: 73yo M with ams. EEG to evaluate for seizure  Level of alertness: Awake  AEDs during EEG study: None  Technical aspects: This EEG study was done with scalp electrodes positioned according to the 10-20 International system of electrode placement. Electrical activity was reviewed with band pass filter of 1-70Hz , sensitivity of 7 uV/mm, display speed of 64mm/sec with a 60Hz  notched filter applied as appropriate. EEG data were recorded continuously and digitally stored.  Video monitoring was available and reviewed as appropriate.  Description: The posterior dominant rhythm consists of 8 Hz activity of moderate voltage (25-35 uV) seen predominantly in posterior head regions, symmetric and reactive to eye opening and eye closing. EEG showed intermittent generalized 3 to 6 Hz theta-delta slowing. Hyperventilation and photic stimulation were not performed.     ABNORMALITY - Intermittent slow, generalized  IMPRESSION: This study is suggestive of mild diffuse encephalopathy. No seizures or epileptiform discharges were seen throughout the recording.  Cherry Wittwer O Toneka Fullen

## 2023-07-10 NOTE — Progress Notes (Signed)
 Mobility Specialist Progress Note:   07/10/23 1622  Mobility  Activity Dangled on edge of bed  Level of Assistance Moderate assist, patient does 50-74%  Activity Response Tolerated fair  Mobility Referral Yes  Mobility visit 1 Mobility  Mobility Specialist Start Time (ACUTE ONLY) 1130  Mobility Specialist Stop Time (ACUTE ONLY) 1142  Mobility Specialist Time Calculation (min) (ACUTE ONLY) 12 min   Pt received w/ legs over side of bed and bed alarm going off. Pt daughter states he has to use to BR. MS was able to get pt to EOB w/ ModA. Pt needed many VC for redirection, very confused this session. MS noticed pt had an episode of BM incontinence. Attempted to get pt to stand pt unwilling. Returned to supine; NT assisted in rolling and pericare. Situated back in bed w/ call bell and personal belongings in reach. All needs met.  Inetta Manes Mobility Specialist  Please contact vis Secure Chat or  Rehab Office [phone]

## 2023-07-10 NOTE — Progress Notes (Addendum)
 EEG complete - results pending

## 2023-07-10 NOTE — Progress Notes (Signed)
 PT Cancellation Note  Patient Details Name: Brandon Rubio. MRN: [id number] DOB: 02/26/1951   Cancelled Treatment:    Reason Eval/Treat Not Completed: (P) Patient at procedure or test/unavailable;Patient declined, no reason specified, pt declining mobility due to fatigue on initial attempt, on return EEG with pt. Will check back as schedule allows to continue with PT POC.  Brandon Rubio. PTA Acute Rehabilitation Services Office: [phone]    Brandon Rubio 07/10/2023, 1:24 PM

## 2023-07-10 NOTE — Progress Notes (Signed)
 [facility] Progress Note Patient: Brandon Rubio. UJW:[id number] DOB: [birth year] DOA: 07/05/2023  DOS: the patient was seen and examined on 07/10/2023  Brief Hospital Course: Patient with PMH of type II DM, HTN, HLD, adrenal insufficiency, non-small cell lung cancer on chemotherapy, presented to the hospital with complaints of lethargic event.  Currently being treated for possible pneumonia with sepsis.  Assessment and Plan: Concern for sepsis with pneumonia. CT chest abdomen pelvis shows evidence of groundglass opacities of bilaterally.  Left more than right. Procalcitonin level is negative. Sepsis physiology appears to have resolved for now. Blood cultures are negative Switch to oral antibiotic.  Levaquin .  Lethargy with acute on metabolic encephalopathy. Likely in setting of infection. No focal deficit exam on my evaluation. PT OT consult. Became confused again on 5/2 overnight.  EEG negative.  Metabolic workup unremarkable.  Possibly secondary to Levaquin .  Will monitor for now. Monitor.  SVT. In the setting of sepsis. Cardiology was consulted. Was on amiodarone . Currently off of the medication.  On Lopressor .  Monitor. Echocardiogram shows 55 to 60% EF, also shows some wall motion abnormality grade 1 diastolic dysfunction. Outpatient follow-up with cardiology recommended.  Chronic back pain. On Norco at home. Currently receiving Percocet here in the hospital at a lower dose. Will monitor.  Hypomagnesemia, hyponatremia. Currently treated. Monitor.  History of adrenal insufficiency pretension on steroids.  Initially was also stress dose steroids. Back to home regimen.  Monitor.  Chemotherapy induced pancytopenia. H&H stable.  Monitor. Appreciate oncology consultation. Resumed GM-CSF on 4/1.  Continue until ANC is more than 1500.  Metastatic non-small cell lung cancer. Outpatient follow-up with oncology again.  Type 2 diabetes mellitus. Hemoglobin A1c  9.2. Hyperglycemia most likely secondary to steroids. Outpatient follow-up with PCP recommended.      Subjective: Was confused last night as well as this morning.  No hallucination.  No delusion.  No new headache or focal deficit.  Physical Exam: Drowsy but easily arousable.  Oriented x 3.  No asterixis.  Able to follow commands. S1-S2 present. Bowel sound present but Faint basal crackles. No edema.  Data Reviewed: I have Reviewed nursing notes, Vitals, and Lab results. Ordered CBC and BMP.  Reviewed CBC and BMP.  Ordered EEG.  Disposition: Status is: Inpatient Remains inpatient appropriate because: Requiring improvement in mentation.  Place and maintain sequential compression device Start: 07/07/23 0826 SCDs Start: 07/05/23 2323   Family Communication: Family at bedside Level of care: Telemetry Medical   Vitals:   07/10/23 1155 07/10/23 1331 07/10/23 1603 07/10/23 1955  BP: 118/65  111/82 106/80  Pulse: 91  92 94  Resp: (!) 25   16  Temp: 97.8 F (36.6 C)   (!) 97.4 F (36.3 C)  TempSrc: Oral   Oral  SpO2: 92% 95% 91% (!) 89%  Weight:      Height:         Author: Charlean Congress, MD 07/10/2023 7:58 PM  Please look on [email or web address] to find out who is on call.

## 2023-07-11 DIAGNOSIS — G9340 Encephalopathy, unspecified: Secondary | ICD-10-CM | POA: Diagnosis not present

## 2023-07-11 LAB — CBC WITH DIFFERENTIAL/PLATELET
Abs Immature Granulocytes: 0.5 10*3/uL — ABNORMAL HIGH (ref 0.00–0.07)
Basophils Absolute: 0 10*3/uL (ref 0.0–0.1)
Basophils Relative: 1 %
Eosinophils Absolute: 0 10*3/uL (ref 0.0–0.5)
Eosinophils Relative: 0 %
HCT: 35.7 % — ABNORMAL LOW (ref 39.0–52.0)
Hemoglobin: 11.7 g/dL — ABNORMAL LOW (ref 13.0–17.0)
Lymphocytes Relative: 7 %
Lymphs Abs: 1.2 10*3/uL (ref 0.7–4.0)
MCH: 30.7 pg (ref 26.0–34.0)
MCHC: 32.8 g/dL (ref 30.0–36.0)
MCV: 93.7 fL (ref 80.0–100.0)
Monocytes Absolute: 0.3 10*3/uL (ref 0.1–1.0)
Monocytes Relative: 2 %
Neutro Abs: 14.4 10*3/uL — ABNORMAL HIGH (ref 1.7–7.7)
Neutrophils Relative %: 87 %
Platelets: 120 10*3/uL — ABNORMAL LOW (ref 150–400)
RBC: 3.81 MIL/uL — ABNORMAL LOW (ref 4.22–5.81)
RDW: 16.7 % — ABNORMAL HIGH (ref 11.5–15.5)
WBC: 16.6 10*3/uL — ABNORMAL HIGH (ref 4.0–10.5)
nRBC: 0.8 % — ABNORMAL HIGH (ref 0.0–0.2)
nRBC: 3 /100{WBCs} — ABNORMAL HIGH

## 2023-07-11 LAB — BASIC METABOLIC PANEL WITH GFR
Anion gap: 12 (ref 5–15)
BUN: 15 mg/dL (ref 8–23)
CO2: 22 mmol/L (ref 22–32)
Calcium: 8.4 mg/dL — ABNORMAL LOW (ref 8.9–10.3)
Chloride: 103 mmol/L (ref 98–111)
Creatinine, Ser: 1 mg/dL (ref 0.61–1.24)
GFR, Estimated: >60 mL/min (ref >60)
Glucose, Bld: 115 mg/dL — ABNORMAL HIGH (ref 70–99)
Potassium: 4.3 mmol/L (ref 3.5–5.1)
Sodium: 137 mmol/L (ref 135–145)

## 2023-07-11 LAB — GLUCOSE, CAPILLARY
Glucose-Capillary: 124 mg/dL — ABNORMAL HIGH (ref 70–99)
Glucose-Capillary: 138 mg/dL — ABNORMAL HIGH (ref 70–99)
Glucose-Capillary: 175 mg/dL — ABNORMAL HIGH (ref 70–99)
Glucose-Capillary: 171 mg/dL — ABNORMAL HIGH (ref 70–99)

## 2023-07-11 LAB — MAGNESIUM: Magnesium: 1.9 mg/dL (ref 1.7–2.4)

## 2023-07-11 NOTE — Progress Notes (Signed)
 Occupational Therapy Treatment Patient Details Name: Brandon Rubio. MRN: [id number] DOB: [birth year] Today's Date: 07/11/2023   History of present illness 73yo male admitted 07/05/23 with confusion and weakness. Episode of SVT vs VT, suspect due to PNA and sepsis. CXR: BLL opacties L>R. CTHead: atrophy and chronic small vessel ischemic changes, no acute process.   PMH: metastatic non-small cell lung cancer (last chemo 07/01/23), CAD, NSTEMI, cataracts, DM2, HLT, HTN, peripheral neuropathy, tobacco.   OT comments  Pt. Seen for skilled OT treatment session.  Agreeable to participation but limited secondary to "not feeling well all over" and c/o headache requesting to lay back down.  Bed mobility to/from eob with CGA.  Seated eob for LB dressing with CGA, light trunk support to gain balance during figure four for socks.  Pt. Requests to try again tomorrow.  Will attempt back as able.        If plan is discharge home, recommend the following:  A little help with walking and/or transfers;A little help with bathing/dressing/bathroom;Assistance with cooking/housework;Assist for transportation;Help with stairs or ramp for entrance   Equipment Recommendations  BSC/3in1    Recommendations for Other Services      Precautions / Restrictions Precautions Precautions: Fall Recall of Precautions/Restrictions: Impaired Precaution/Restrictions Comments: low WBC count       Mobility Bed Mobility Overal bed mobility: Needs Assistance Bed Mobility: Sit to Supine, Supine to Sit     Supine to sit: Contact guard Sit to supine: Contact guard assist   General bed mobility comments: CGA for saftey    Transfers                         Balance                                           ADL either performed or assessed with clinical judgement   ADL Overall ADL's : Needs assistance/impaired                     Lower Body Dressing: Contact guard  assist;Sitting/lateral leans Lower Body Dressing Details (indicate cue type and reason): figure four with light trunk support for balance               General ADL Comments: made it to eob for lb dressing.  plans for in room ambulation, other ADLs but pt. states "i need to lay back down my head is killing me"    Extremity/Trunk Assessment              Vision       Perception     Praxis     Communication     Cognition Arousal: Alert Behavior During Therapy: [facility] for tasks assessed/performed Cognition: No family/caregiver present to determine baseline             OT - Cognition Comments: oriented, pleasant and conversational.                          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 G.dtr watching the dog for him now.  Pt. Shared feelings about medical dx.  Regarding his adrenal insufficiency and CA.  He vocalized understanding  but also demonstrated notable frustration with not quite understanding why he doesn't feel well.  Declined chaplain states he has people he can talk with if he wants.  Updated rn of the conversation and requested they review and answer any of his questions about his medical dx. And hospital course to aide in pts. Understanding.      Pertinent Vitals/ Pain       Pain Assessment Pain Assessment: Faces Faces Pain Scale: Hurts a little bit Pain Location: headache Pain Descriptors / Indicators: Discomfort Pain Intervention(s): Limited activity within patient's tolerance, Monitored during session, Repositioned, Patient requesting pain meds-RN notified  Home Living                                          Prior Functioning/Environment              Frequency  Min 1X/week        Progress Toward Goals  OT Goals(current goals can now be found in the care plan section)  Progress towards OT goals: Progressing  toward goals     Plan      Co-evaluation                 AM-PAC OT "6 Clicks" Daily Activity     Outcome Measure   Help from another person eating meals?: None Help from another person taking care of personal grooming?: A Little Help from another person toileting, which includes using toliet, bedpan, or urinal?: A Little Help from another person bathing (including washing, rinsing, drying)?: A Little Help from another person to put on and taking off regular upper body clothing?: A Little Help from another person to put on and taking off regular lower body clothing?: A Little 6 Click Score: 19    End of Session    OT Visit Diagnosis: Unsteadiness on feet (R26.81);Muscle weakness (generalized) (M62.81)   Activity Tolerance Other (comment) (headache, requested to lay back down)   Patient Left in bed;with bed alarm set;with call bell/phone within reach   Nurse Communication Other (comment);Mobility status (secure chat with session, details, pt c/o headache and also needing some questions answered about his medical dx and hospital course of events)        Time: 1191-4782 OT Time Calculation (min): 19 min  Charges: OT General Charges $OT Visit: 1 Visit OT Treatments $Self Care/Home Management : 8-22 mins  Howell Macintosh, COTA/L Acute Rehabilitation [phone]   Leory Rands Lorraine-COTA/L  07/11/2023, 3:08 PM

## 2023-07-11 NOTE — Plan of Care (Signed)

## 2023-07-11 NOTE — Progress Notes (Signed)
 [facility] Progress Note Patient: Brandon Rubio. YQI:[id number] DOB: 03-15-1950 DOA: 07/05/2023  DOS: the patient was seen and examined on 07/11/2023  Brief Hospital Course: As per prior documentation: "Patient with PMH of type II DM, HTN, HLD, adrenal insufficiency, non-small cell lung cancer on chemotherapy, presented to the hospital with complaints of lethargic event.  Currently being treated for possible pneumonia with sepsis".  07/11/2023: Metabolic encephalopathy seems to have improved significantly.  Assessment and Plan: Concern for sepsis with pneumonia. CT chest abdomen pelvis shows evidence of groundglass opacities of bilaterally.  Left more than right. Procalcitonin level is negative. Sepsis physiology appears to have resolved for now. Blood cultures are negative Switch to oral antibiotic.  Levaquin . 07/11/2023: Sepsis physiology has resolved.  Patient is not on antibiotics.  Lethargy with acute on metabolic encephalopathy. Likely in setting of infection. No focal deficit exam on my evaluation. PT OT consult. Became confused again on 5/2 overnight.  EEG negative.  Metabolic workup unremarkable.  Possibly secondary to Levaquin .  Will monitor for now. Monitor. 07/11/2023: Resolved significantly.  SVT. In the setting of sepsis. Cardiology was consulted. Was on amiodarone . Currently off of the medication.  On Lopressor .  Monitor. Echocardiogram shows 55 to 60% EF, also shows some wall motion abnormality grade 1 diastolic dysfunction. Outpatient follow-up with cardiology recommended.  Chronic back pain. On Norco at home. Currently receiving Percocet here in the hospital at a lower dose. Will monitor.  Hypomagnesemia, hyponatremia. Currently treated. Monitor.  History of adrenal insufficiency pretension on steroids.  Initially was also stress dose steroids. Back to home regimen.  Monitor.  Chemotherapy induced pancytopenia. H&H stable.  Monitor. Appreciate  oncology consultation. Resumed GM-CSF on 4/1.  Continue until ANC is more than 1500.  Metastatic non-small cell lung cancer. Outpatient follow-up with oncology again.  Type 2 diabetes mellitus. Hemoglobin A1c 9.2. Hyperglycemia most likely secondary to steroids. Outpatient follow-up with PCP recommended.      Subjective:  - Encephalopathy seems to have resolved significantly.  Physical Exam: General Condition: Awake and alert.  Not in any distress. CVS: S1-S2. Neuro: Awake and alert. Extremities: No leg edema.    Data Reviewed: I have Reviewed nursing notes, Vitals, and Lab results. Ordered CBC and BMP.  Reviewed CBC and BMP.  Ordered EEG.  Disposition: Status is: Inpatient Remains inpatient appropriate because: Requiring improvement in mentation.  Place and maintain sequential compression device Start: 07/07/23 0826 SCDs Start: 07/05/23 2323   Family Communication: Family at bedside Level of care: Telemetry Medical   Vitals:   07/11/23 1508 07/11/23 1516 07/11/23 1600 07/11/23 1700  BP: 113/72     Pulse: 94 97  98  Resp: (!) 23 (!) 25 (!) 21 16  Temp: (!) 97.4 F (36.3 C)     TempSrc: Oral     SpO2: 93% 96%  92%  Weight:      Height:         Author: Doroteo Gasmen, MD 07/11/2023 5:41 PM  Please look on [email or web address] to find out who is on call.

## 2023-07-12 DIAGNOSIS — G9340 Encephalopathy, unspecified: Secondary | ICD-10-CM | POA: Diagnosis not present

## 2023-07-12 LAB — BASIC METABOLIC PANEL WITH GFR
Anion gap: 11 (ref 5–15)
BUN: 19 mg/dL (ref 8–23)
CO2: 21 mmol/L — ABNORMAL LOW (ref 22–32)
Calcium: 8.5 mg/dL — ABNORMAL LOW (ref 8.9–10.3)
Chloride: 106 mmol/L (ref 98–111)
Creatinine, Ser: 1.05 mg/dL (ref 0.61–1.24)
GFR, Estimated: >60 mL/min (ref >60)
Glucose, Bld: 141 mg/dL — ABNORMAL HIGH (ref 70–99)
Potassium: 4.2 mmol/L (ref 3.5–5.1)
Sodium: 138 mmol/L (ref 135–145)

## 2023-07-12 LAB — GLUCOSE, CAPILLARY
Glucose-Capillary: 121 mg/dL — ABNORMAL HIGH (ref 70–99)
Glucose-Capillary: 260 mg/dL — ABNORMAL HIGH (ref 70–99)

## 2023-07-12 MED ORDER — NICOTINE 14 MG/24HR TD PT24: 14.0000 mg | MEDICATED_PATCH | Freq: Every day | TRANSDERMAL | 0 refills | Status: DC

## 2023-07-12 MED ORDER — ENSURE ENLIVE PO LIQD: 237.0000 mL | Freq: Three times a day (TID) | ORAL | 12 refills | Status: DC

## 2023-07-12 MED ORDER — MAGNESIUM OXIDE -MG SUPPLEMENT 400 (240 MG) MG PO TABS: 400.0000 mg | ORAL_TABLET | Freq: Two times a day (BID) | ORAL | 1 refills | Status: DC

## 2023-07-12 MED ORDER — LIDOCAINE 5 % EX PTCH: 1.0000 | MEDICATED_PATCH | CUTANEOUS | 0 refills | Status: DC

## 2023-07-12 MED ORDER — PANTOPRAZOLE SODIUM 40 MG PO TBEC: 40.0000 mg | DELAYED_RELEASE_TABLET | Freq: Every day | ORAL | 1 refills | Status: DC

## 2023-07-12 NOTE — Discharge Summary (Signed)
 Physician Discharge Summary  Patient ID: Brandon Rubio. MRN: [id number] DOB/AGE: April 23, 1950 73 y.o.  Admit date: 07/05/2023 Discharge date: 07/12/2023  Admission Diagnoses:  Discharge Diagnoses:  Principal Problem:   Sepsis (HCC) Active Problems:   Diabetes mellitus type 2 with complications (HCC)   Essential hypertension   CAD (coronary artery disease)   Adenocarcinoma of right lung, stage 3 (HCC)   Anemia, unspecified   Adrenal insufficiency (HCC)   Thrombocytopenia (HCC)   Acute encephalopathy   Paroxysmal supraventricular tachycardia (HCC)   Discharged Condition: stable  Hospital Course: Patient is a 73 year old male with past medical history significant for type 2 diabetes mellitus, hypertension, hyperlipidemia, adrenal insufficiency and non-small cell lung cancer on chemotherapy.  Patient was admitted with lethargy.  Patient was managed for sepsis that is likely secondary to pneumonia, metabolic encephalopathy, in addition to other chronic management.  Encephalopathy has resolved.  Sepsis physiology has resolved.  Patient will discharge back home with home health PT OT.  Patient will follow-up with primary care provider and oncology team on discharge.  Concern for sepsis with pneumonia versus SIRS: -CT chest, abdomen and pelvis revealed evidence of groundglass opacities bilaterally, left more than right. -Procalcitonin level is negative. -Sepsis physiology appears to have resolved for now. -Blood cultures are negative   Acute on metabolic encephalopathy: -In the setting of possible infection versus others. -No focal deficit exam on my evaluation. -EEG negative.   -Metabolic workup unremarkable.   -Possibly secondary to Levaquin .      SVT: -In the setting of sepsis versus SIRS. -Cardiology was consulted. -Initially managed with amiodarone .   - Patient will be discharged on beta-blocker. - Cardiology team directed care for SVT.   -Echocardiogram shows 55 to 60%  EF, also shows some wall motion abnormality grade 1 diastolic dysfunction. -Outpatient follow-up with cardiology recommended.   Chronic back pain: -On Norco at home. -Cautious use of opiates. - Opiates may have contributed to the encephalopathy.   Hypomagnesemia, hyponatremia: -Hyponatremia has resolved. - Sodium prior to discharge was 138. - Last magnesium  level visualized was 1.9. - Continue mag oxide on discharge.   History of adrenal insufficiency: - Discharge patient on dexamethasone .   Chemotherapy induced pancytopenia: -CBC prior to discharge revealed WBC of 16.6 (likely secondary to steroids), hemoglobin of 11.7 g/dL and platelet count of 409.   -Patient resumed GM-CSF on 06/09/2023, but has been discontinued.     Metastatic non-small cell lung cancer: Outpatient follow-up with oncology again.   Type 2 diabetes mellitus: Hemoglobin A1c 9.2. Hyperglycemia most likely secondary to steroids. PCP will continue to optimize.      Consults: cardiology  Significant Diagnostic Studies:  CT chest revealed: 1. No acute chest CT findings. No findings in the abdomen and pelvis suggesting a site for sepsis. 2. Stable small right and minimal left pleural effusions. 3. Emphysema. 4. Multifocal ground-glass nodules/masses on the left greater than right consistent with multifocal adenocarcinoma. Some lesions progressive from 06/03/2023. 5. Stable volume loss and rounded consolidation in the right lower lobe with calcifications, consistent with treated neoplasm and possible chronic aspiration component. 6. Increased size of ground-glass nodules in the left and right upper lobe. 7. Aortic and coronary artery atherosclerosis. 8. Minimal ascites, new from last month. 9. Fluid in the colon compatible with diarrhea. No bowel obstruction or inflammation. 10. Chronic thickened folds in the stomach. 11. 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   Aortic  Atherosclerosis (ICD10-I70.0) and Emphysema (ICD10-J43.9).  Electronically Signed   By: Denman Fischer M.D.   On: 07/06/2023 22:46    CT head revealed: Atrophy and chronic small vessel ischemic changes. No acute intracranial process identified.   Discharge Exam: Blood pressure 113/71, pulse 97, temperature 98.2 F (36.8 C), temperature source Oral, resp. rate 18, height 6' (1.829 m), weight 87.3 kg, SpO2 97%.   Disposition: Discharge disposition: 06-Home-Health Care Svc       Discharge Instructions     Diet - low sodium heart healthy   Complete by: As directed    Increase activity slowly   Complete by: As directed       Allergies as of 07/12/2023       Reactions   Penicillins Anaphylaxis, Swelling, Other (See Comments)   THE THROAT SWELLS!!!! Because of a history of documented adverse serious drug reaction;[name] Alert bracelet  is recommended   Albumin  (human) Hives, Itching   Cyramza  [ramucirumab ] Itching        Medication List     STOP taking these medications    bisacodyl 5 MG EC tablet Commonly known as: DULCOLAX   folic acid  1 MG tablet Commonly known as: FOLVITE    Magnesium  Gluconate 550 MG Tabs   omeprazole  20 MG capsule Commonly known as: PRILOSEC Replaced by: pantoprazole  40 MG tablet   ondansetron  8 MG tablet Commonly known as: ZOFRAN    prednisoLONE 5 MG Tabs tablet   prochlorperazine  10 MG tablet Commonly known as: COMPAZINE        TAKE these medications    aspirin  EC 81 MG tablet Take 81 mg by mouth in the morning. Swallow whole.   Centrum Silver 50+Men Tabs Take 1 tablet by mouth daily with breakfast.   dexamethasone  0.75 MG tablet Commonly known as: DECADRON  Take 1 tablet (0.75 mg total) by mouth daily. What changed: Another medication with the same name was removed. Continue taking this medication, and follow the directions you see here.   feeding supplement Liqd Take 237 mLs by mouth 3 (three) times daily between  meals.   HYDROcodone -acetaminophen  10-325 MG tablet Commonly known as: NORCO Take 1 tablet by mouth every 12 (twelve) hours as needed.   lidocaine  5 % Commonly known as: LIDODERM  Place 1 patch onto the skin daily. Remove & Discard patch within 12 hours or as directed by MD   magnesium  oxide 400 (240 Mg) MG tablet Commonly known as: MAG-OX Take 1 tablet (400 mg total) by mouth 2 (two) times daily.   metFORMIN  1000 MG tablet Commonly known as: GLUCOPHAGE  TAKE 1 TABLET(1000 MG) BY MOUTH TWICE DAILY WITH A MEAL   metoprolol  tartrate 25 MG tablet Commonly known as: LOPRESSOR  Take 1 tablet (25 mg total) by mouth 2 (two) times daily.   nicotine  14 mg/24hr patch Commonly known as: NICODERM CQ  - dosed in mg/24 hours Place 1 patch (14 mg total) onto the skin daily. Start taking on: Jul 13, 2023   pantoprazole  40 MG tablet Commonly known as: PROTONIX  Take 1 tablet (40 mg total) by mouth daily. Start taking on: Jul 13, 2023 Replaces: omeprazole  20 MG capsule   simvastatin  40 MG tablet Commonly known as: ZOCOR  TAKE 1 TABLET BY MOUTH EVERY EVENING AT 6 PM. What changed: See the new instructions.       Time spent: 35 minutes.  SignedDoroteo Gasmen 07/12/2023, 2:32 PM

## 2023-07-12 NOTE — Progress Notes (Signed)
 Patient verbalized understanding of dc instructions. All belongings and paperwork given to patient

## 2023-07-12 NOTE — Plan of Care (Signed)

## 2023-07-12 NOTE — Progress Notes (Signed)
 RNCM received HHPT/OT order.  RNCM spoke with patient's granddaughter, Dorthula Gavel, to offer choice and no preference, so Alease Hunter at [facility] contacted with order and confirmation received.

## 2023-07-12 NOTE — Progress Notes (Signed)
 Occupational Therapy Treatment Patient Details Name: Brandon Rubio. MRN: [id number] DOB: 06-19-1950 Today's Date: 07/12/2023   History of present illness 73yo male admitted 07/05/23 with confusion and weakness. Episode of SVT vs VT, suspect due to PNA and sepsis. CXR: BLL opacties L>R. CTHead: atrophy and chronic small vessel ischemic changes, no acute process.   PMH: metastatic non-small cell lung cancer (last chemo 07/01/23), CAD, NSTEMI, cataracts, DM2, HLT, HTN, peripheral neuropathy, tobacco.   OT comments  Pt. Seen for skilled OT treatment session with g.dtr present.  Pt. Reports feeling a little better today and is eager for oob.  Able to complete bed mobility in/out with S.  LB dressing sitting eob with CGA.  Standing grooming task with CGA.  Education on PLB strategies. Pt. Able to return demo.  Pt. And g.dtr with several questions regarding HH services for pt. Once d/c home. CM/SW notified and also updated RN.  Agree with current recommendations for [facility] OT.  Cont. With acute OT POC.        If plan is discharge home, recommend the following:  A little help with walking and/or transfers;A little help with bathing/dressing/bathroom;Assistance with cooking/housework;Assist for transportation;Help with stairs or ramp for entrance   Equipment Recommendations  BSC/3in1    Recommendations for Other Services Speech consult    Precautions / Restrictions Precautions Precautions: Fall Recall of Precautions/Restrictions: Impaired Precaution/Restrictions Comments: low WBC count       Mobility Bed Mobility Overal bed mobility: Needs Assistance Bed Mobility: Sit to Supine, Supine to Sit     Supine to sit: Supervision Sit to supine: Supervision        Transfers Overall transfer level: Needs assistance Equipment used: Rolling walker (2 wheels) Transfers: Sit to/from Stand, Bed to chair/wheelchair/BSC Sit to Stand: Contact guard assist Stand pivot transfers: Contact guard  assist         General transfer comment: CGA for saftey, no LOB on rise, placed hands on bed before sitting down without cueing required     Balance                                           ADL either performed or assessed with clinical judgement   ADL Overall ADL's : Needs assistance/impaired     Grooming: Contact guard assist;Standing;Wash/dry hands               Lower Body Dressing: Contact guard assist;Sitting/lateral leans Lower Body Dressing Details (indicate cue type and reason): figure four for socks Toilet Transfer: Contact guard assist;Ambulation;Rolling walker (2 wheels) Toilet Transfer Details (indicate cue type and reason): ambulated into the b.room, full turn then exit. declined need for use                Extremity/Trunk Assessment              Vision       Perception     Praxis     Communication Communication Communication: No apparent difficulties   Cognition Arousal: Alert Behavior During Therapy: WFL for tasks assessed/performed Cognition: No apparent impairments             OT - Cognition Comments: oriented, pleasant and conversational.                 Following commands: Impaired Following commands impaired: Follows one step commands with increased time      Cueing  Cueing Techniques: Verbal cues  Exercises      Shoulder Instructions       General Comments      Pertinent Vitals/ Pain       Pain Assessment Pain Assessment: No/denies pain  Home Living                                          Prior Functioning/Environment              Frequency  Min 1X/week        Progress Toward Goals  OT Goals(current goals can now be found in the care plan section)  Progress towards OT goals: Progressing toward goals     Plan      Co-evaluation                 AM-PAC OT "6 Clicks" Daily Activity     Outcome Measure   Help from another person eating  meals?: None Help from another person taking care of personal grooming?: A Little Help from another person toileting, which includes using toliet, bedpan, or urinal?: A Little Help from another person bathing (including washing, rinsing, drying)?: A Little Help from another person to put on and taking off regular upper body clothing?: A Little Help from another person to put on and taking off regular lower body clothing?: A Little 6 Click Score: 19    End of Session Equipment Utilized During Treatment: Gait belt;Rolling walker (2 wheels)  OT Visit Diagnosis: Unsteadiness on feet (R26.81);Muscle weakness (generalized) (M62.81)   Activity Tolerance Patient tolerated treatment well   Patient Left in bed;with call bell/phone within reach;with bed alarm set;with family/visitor present   Nurse Communication Other (comment) (secure chat g.dtr with eeg results questions, HH questions for cm/sw, and also pt. Questions regarding o2 support)        Time: 1001-1022 OT Time Calculation (min): 21 min  Charges: OT General Charges $OT Visit: 1 Visit OT Treatments $Self Care/Home Management : 8-22 mins  Howell Macintosh, COTA/L Acute Rehabilitation [phone]   Leory Rands Lorraine-COTA/L  07/12/2023, 10:35 AM

## 2023-07-13 ENCOUNTER — Telehealth: Payer: Self-pay

## 2023-07-13 ENCOUNTER — Telehealth: Payer: Self-pay | Admitting: *Deleted

## 2023-07-13 NOTE — Telephone Encounter (Signed)
 Received a vm from a Male (did not leave her name) stating that he was discharged from the hospital and they had questions about the hospital d/c medication and changing his meds in the hospital.   I called the patient back and I went through his med list with and gave him some clarification.

## 2023-07-13 NOTE — Transitions of Care (Post Inpatient/ED Visit) (Signed)
   07/13/2023  Name: Brandon Rubio. MRN: [id number] DOB: 1951/02/20  Today's TOC FU Call Status: Today's TOC FU Call Status:: Successful TOC FU Call Completed TOC FU Call Complete Date: 07/13/23 Patient's Name and Date of Birth confirmed.  Transition Care Management Follow-up Telephone Call Date of Discharge: 07/12/23 Discharge Facility: [facility]) Type of Discharge: Inpatient Admission Primary Inpatient Discharge Diagnosis:: Sepsis How have you been since you were released from the hospital?: Better (I am doing pretty good) Any questions or concerns?: No  Items Reviewed: Did you receive and understand the discharge instructions provided?: Yes Medications obtained,verified, and reconciled?: No Medications Not Reviewed Reasons:: Other: (Patient had family to call Dr office and they went over meds) Any new allergies since your discharge?: No Dietary orders reviewed?: No Do you have support at home?: Yes People in Home [RPT]: alone Name of Support/Comfort Primary Source: Brandon Rubio grand daughter and friends  Medications Reviewed Today: Medications Reviewed Today   Medications were not reviewed in this encounter     Home Care and Equipment/Supplies: Were Home Health Services Ordered?: Yes Name of Home Health Agency:: [facility] Has Agency set up a time to come to your home?: No EMR reviewed for Home Health Orders: Orders present/patient has not received call (refer to CM for follow-up) (Per patient if he has not heard from them today. He will contact them tomorrow) Any new equipment or medical supplies ordered?: NA  Functional Questionnaire: Do you need assistance with bathing/showering or dressing?: Yes Do you need assistance with meal preparation?: Yes Do you need assistance with eating?: No Do you need assistance with getting out of bed/getting out of a chair/moving?: No Do you have difficulty managing or taking your medications?: No  Follow up appointments  reviewed: PCP Follow-up appointment confirmed?: No MD Provider Line Number:[phone] Given: Yes (Per patient he will cal lhimself to set up) Specialist Hospital Follow-up appointment confirmed?: Yes Date of Specialist follow-up appointment?: 07/22/23 Follow-Up Specialty Provider:: Dr Liam Redhead onconlogist/ [id number] Dr Renna Cary cardiologist Do you need transportation to your follow-up appointment?: No Do you understand care options if your condition(s) worsen?: Yes-patient verbalized understanding  SDOH Interventions Today    Flowsheet Row Most Recent Value  SDOH Interventions   Food Insecurity Interventions Intervention Not Indicated  Housing Interventions Intervention Not Indicated  Transportation Interventions Intervention Not Indicated, Patient Resources (Friends/Family)  [Per patient he has friends that will take him]  Utilities Interventions Intervention Not Indicated     Patient had family member to call Dr office and go over medication list. Did not want to go over again.   Una Ganser BSN RN [facility] [place] [facility] Management Coordinator [email or web address] Direct Dial: [phone]  Fax: [phone] Website: [email or web address]

## 2023-07-14 DIAGNOSIS — T451X5D Adverse effect of antineoplastic and immunosuppressive drugs, subsequent encounter: Secondary | ICD-10-CM | POA: Diagnosis not present

## 2023-07-14 DIAGNOSIS — D630 Anemia in neoplastic disease: Secondary | ICD-10-CM | POA: Diagnosis not present

## 2023-07-14 DIAGNOSIS — G8929 Other chronic pain: Secondary | ICD-10-CM | POA: Diagnosis not present

## 2023-07-14 DIAGNOSIS — E2740 Unspecified adrenocortical insufficiency: Secondary | ICD-10-CM | POA: Diagnosis not present

## 2023-07-14 DIAGNOSIS — R918 Other nonspecific abnormal finding of lung field: Secondary | ICD-10-CM | POA: Diagnosis not present

## 2023-07-14 DIAGNOSIS — Z7982 Long term (current) use of aspirin: Secondary | ICD-10-CM | POA: Diagnosis not present

## 2023-07-14 DIAGNOSIS — C3491 Malignant neoplasm of unspecified part of right bronchus or lung: Secondary | ICD-10-CM | POA: Diagnosis not present

## 2023-07-14 DIAGNOSIS — M549 Dorsalgia, unspecified: Secondary | ICD-10-CM | POA: Diagnosis not present

## 2023-07-14 DIAGNOSIS — J439 Emphysema, unspecified: Secondary | ICD-10-CM | POA: Diagnosis not present

## 2023-07-14 DIAGNOSIS — Z7984 Long term (current) use of oral hypoglycemic drugs: Secondary | ICD-10-CM | POA: Diagnosis not present

## 2023-07-14 DIAGNOSIS — E1165 Type 2 diabetes mellitus with hyperglycemia: Secondary | ICD-10-CM | POA: Diagnosis not present

## 2023-07-14 DIAGNOSIS — D61810 Antineoplastic chemotherapy induced pancytopenia: Secondary | ICD-10-CM | POA: Diagnosis not present

## 2023-07-14 DIAGNOSIS — I471 Supraventricular tachycardia, unspecified: Secondary | ICD-10-CM | POA: Diagnosis not present

## 2023-07-14 DIAGNOSIS — Z9181 History of falling: Secondary | ICD-10-CM | POA: Diagnosis not present

## 2023-07-14 DIAGNOSIS — I2510 Atherosclerotic heart disease of native coronary artery without angina pectoris: Secondary | ICD-10-CM | POA: Diagnosis not present

## 2023-07-14 DIAGNOSIS — Z51A Encounter for sepsis aftercare: Secondary | ICD-10-CM | POA: Diagnosis not present

## 2023-07-14 DIAGNOSIS — R188 Other ascites: Secondary | ICD-10-CM | POA: Diagnosis not present

## 2023-07-14 DIAGNOSIS — J90 Pleural effusion, not elsewhere classified: Secondary | ICD-10-CM | POA: Diagnosis not present

## 2023-07-14 DIAGNOSIS — D696 Thrombocytopenia, unspecified: Secondary | ICD-10-CM | POA: Diagnosis not present

## 2023-07-14 DIAGNOSIS — I700 Atherosclerosis of aorta: Secondary | ICD-10-CM | POA: Diagnosis not present

## 2023-07-14 DIAGNOSIS — I10 Essential (primary) hypertension: Secondary | ICD-10-CM | POA: Diagnosis not present

## 2023-07-14 DIAGNOSIS — I723 Aneurysm of iliac artery: Secondary | ICD-10-CM | POA: Diagnosis not present

## 2023-07-15 ENCOUNTER — Inpatient Hospital Stay

## 2023-07-17 ENCOUNTER — Ambulatory Visit: Payer: Self-pay | Admitting: Internal Medicine

## 2023-07-17 ENCOUNTER — Ambulatory Visit: Payer: Self-pay

## 2023-07-17 DIAGNOSIS — I700 Atherosclerosis of aorta: Secondary | ICD-10-CM | POA: Diagnosis not present

## 2023-07-17 DIAGNOSIS — C3491 Malignant neoplasm of unspecified part of right bronchus or lung: Secondary | ICD-10-CM | POA: Diagnosis not present

## 2023-07-17 DIAGNOSIS — E1165 Type 2 diabetes mellitus with hyperglycemia: Secondary | ICD-10-CM | POA: Diagnosis not present

## 2023-07-17 DIAGNOSIS — D630 Anemia in neoplastic disease: Secondary | ICD-10-CM | POA: Diagnosis not present

## 2023-07-17 DIAGNOSIS — I10 Essential (primary) hypertension: Secondary | ICD-10-CM | POA: Diagnosis not present

## 2023-07-17 DIAGNOSIS — D61810 Antineoplastic chemotherapy induced pancytopenia: Secondary | ICD-10-CM | POA: Diagnosis not present

## 2023-07-17 NOTE — Telephone Encounter (Signed)
 Reason for Disposition  Caller requesting a CONTROLLED substance prescription refill (e.g., narcotics, ADHD medicines)  Answer Assessment - Initial Assessment Questions 1. DRUG NAME: "What medicine do you need to have refilled?"     HYDROcodone -acetaminophen  (NORCO) 10-325 MG 2. REFILLS REMAINING: "How many refills are remaining?" (Note: The label on the medicine or pill bottle will show how many refills are remaining. If there are no refills remaining, then a renewal may be needed.)     0 3. EXPIRATION DATE: "What is the expiration date?" (Note: The label states when the prescription will expire, and thus can no longer be refilled.)     N/a 4. PRESCRIBING HCP: "Who prescribed it?" Reason: If prescribed by specialist, call should be referred to that group.     Adelia Homestead, MD 5. SYMPTOMS: "Do you have any symptoms?"     Active chemo pt  Protocols used: Medication Refill and Renewal Call-A-AH

## 2023-07-17 NOTE — Telephone Encounter (Signed)
 Chief Complaint: SOB Symptoms: SOB with exertion, dry cough Frequency: a few days Pertinent Negatives: Patient denies fever, congestion, CP, severe SOB Disposition: [] ED /[] Urgent Care (no appt availability in office) / [x] Appointment(In office/virtual)/ []  [facility]/ [] Home Care/ [x] Refused Recommended Disposition /[] [facility]/ [x]  Follow-up with PCP Additional Notes: Pt c/o exertional SOB that has worsened since being discharged home from the hospital. Pt was recently hospitalized for sepsis vs PNA and D.C home on Sunday. Today, HH therapist came for evaluation and noted exertional SOB. Pt reports self-recovering with rest, but SOB is not baseline for him. Pt denies any fever, CP, severe SOB. Triager does not appreciate audible SOB/wheezing during call. Pt is speaking in full sentences. Of note, pt is currently getting chemotherapy and is unsure if this is r/t side effects. Pt requesting to only see PCP. Triager called [facility] to see if accommodations can be made, and there is no availability for acute visit. Pt requested to be added on the waiting list, but triager strongly suggested UC by EOD for evaluation/tx. Pt states that he will not go to UC because he does not want to see anymore doctors except PCP. Pt endorses that he has a complex medical history and perhaps too many doctors are providing tx that may be interfering with each other, thus wanting PCP involved instead of UC. Triager will forward encounter for Dr. Nicolette Barrio 's office to review and advise if accomodation can be made. Patient verbalized understanding. Triager reinforced again if pt does not hear back from office, to follow disposition for further evaluation/treatment.     Copied from CRM [id number]. Topic: Clinical - Medical Advice >> Jul 17, 2023 11:37 AM Keitha Pata L wrote: Reason for CRM: patient was evaluated by Marilyne Shu from [facility] for occupational therapy and stated he's independent and  only seen for evaluation no other services needed She was concerned that he was Short of breath and needed numerous breaks Reason for Disposition  [1] MILD difficulty breathing (e.g., minimal/no SOB at rest, SOB with walking, pulse <100) AND [2] NEW-onset or WORSE than normal  Answer Assessment - Initial Assessment Questions 1. RESPIRATORY STATUS: "Describe your breathing?" (e.g., wheezing, shortness of breath, unable to speak, severe coughing)      Exertional SOB with OT 2. ONSET: "When did this breathing problem begin?"      A few days 3. PATTERN "Does the difficult breathing come and go, or has it been constant since it started?"      constant 4. SEVERITY: "How bad is your breathing?" (e.g., mild, moderate, severe)    - MILD: No SOB at rest, mild SOB with walking, speaks normally in sentences, can lie down, no retractions, pulse < 100.    - MODERATE: SOB at rest, SOB with minimal exertion and prefers to sit, cannot lie down flat, speaks in phrases, mild retractions, audible wheezing, pulse 100-120.    - SEVERE: Very SOB at rest, speaks in single words, struggling to breathe, sitting hunched forward, retractions, pulse > 120      Mild - SOB with exertion 5. RECURRENT SYMPTOM: "Have you had difficulty breathing before?" If Yes, ask: "When was the last time?" and "What happened that time?"      denies 6. CARDIAC HISTORY: "Do you have any history of heart disease?" (e.g., heart attack, angina, bypass surgery, angioplasty)      Hx of heart attack 25-30 years ago 7. LUNG HISTORY: "Do you have any history of lung disease?"  (  e.g., pulmonary embolus, asthma, emphysema)     Lung cancer Hx of DVT 20 years ago 8. CAUSE: "What do you think is causing the breathing problem?"      Unknown Hx of PNA with recent hospitalization? 9. OTHER SYMPTOMS: "Do you have any other symptoms? (e.g., dizziness, runny nose, cough, chest pain, fever)     Dry cough Denies others above 10. O2 SATURATION MONITOR:  "Do  you use an oxygen saturation monitor (pulse oximeter) at home?" If Yes, ask: "What is your reading (oxygen level) today?" "What is your usual oxygen saturation reading?" (e.g., 95%)       unknown  Answer Assessment - Initial Assessment Questions 1. MAIN CONCERN OR SYMPTOM:  "What is your main concern right now?" "What question do you have?" "What's the main symptom you're worried about?" (e.g., breathing difficulty, ankle swelling, weight gain.)     SOB 2. ONSET: "When did the  SOB  start?"     A few days 3. BETTER-SAME-WORSE: "Are you getting better, staying the same, or getting worse compared to the day you were discharged?"     Better overall, but worse with my breath 4. HOSPITALIZATION: "How long were you hospitalized?" (e.g., days)     Few days 5. DISCHARGE DIAGNOSIS:  "What problem or disease were you hospitalized for?"     Sepsis 6. DISCHARGE DATE: "What date were you discharged from the hospital?"     07/12/23 7. DISCHARGE DOCTOR: "Who is the main doctor taking care of you now?"     Sandria Cruise, Juna Oka, MD 8. DISCHARGE APPOINTMENT: "Have you scheduled a follow-up discharge appointment with your doctor?"     no  11. FEVER: "Do you have a fever?" If Yes, ask: "What is it, how was it measured  and when did it start?"       denies 12. OTHER SYMPTOMS: "Do you have any other symptoms?"       SOB  Protocols used: Breathing Difficulty-A-AH, Post-Hospitalization Follow-up Call-A-AH

## 2023-07-18 MED ORDER — HYDROCODONE-ACETAMINOPHEN 10-325 MG PO TABS: 1.0000 | ORAL_TABLET | Freq: Two times a day (BID) | ORAL | 0 refills | Status: DC | PRN

## 2023-07-18 NOTE — Addendum Note (Signed)
 Addended by: Colene Dauphin on: 07/18/2023 07:49 AM   Modules accepted: Orders

## 2023-07-20 NOTE — Telephone Encounter (Signed)
 Ok to double book 07/22/23 at 8:40 or 9:00

## 2023-07-22 ENCOUNTER — Inpatient Hospital Stay: Attending: Internal Medicine | Admitting: Internal Medicine

## 2023-07-22 ENCOUNTER — Inpatient Hospital Stay

## 2023-07-22 ENCOUNTER — Inpatient Hospital Stay: Attending: Internal Medicine

## 2023-07-22 VITALS — BP 126/70 | HR 82 | Temp 97.3°F | Resp 17 | Ht 72.0 in | Wt 189.0 lb

## 2023-07-22 DIAGNOSIS — Z79899 Other long term (current) drug therapy: Secondary | ICD-10-CM | POA: Diagnosis not present

## 2023-07-22 DIAGNOSIS — C3491 Malignant neoplasm of unspecified part of right bronchus or lung: Secondary | ICD-10-CM

## 2023-07-22 DIAGNOSIS — Z923 Personal history of irradiation: Secondary | ICD-10-CM | POA: Diagnosis not present

## 2023-07-22 DIAGNOSIS — C3490 Malignant neoplasm of unspecified part of unspecified bronchus or lung: Secondary | ICD-10-CM | POA: Diagnosis not present

## 2023-07-22 DIAGNOSIS — Z85118 Personal history of other malignant neoplasm of bronchus and lung: Secondary | ICD-10-CM | POA: Insufficient documentation

## 2023-07-22 DIAGNOSIS — C3431 Malignant neoplasm of lower lobe, right bronchus or lung: Secondary | ICD-10-CM | POA: Diagnosis present

## 2023-07-22 LAB — CMP (CANCER CENTER ONLY)
ALT: 13 U/L (ref 0–44)
AST: 16 U/L (ref 15–41)
Albumin: 3.6 g/dL (ref 3.5–5.0)
Alkaline Phosphatase: 52 U/L (ref 38–126)
Anion gap: 8 (ref 5–15)
BUN: 13 mg/dL (ref 8–23)
CO2: 24 mmol/L (ref 22–32)
Calcium: 8.6 mg/dL — ABNORMAL LOW (ref 8.9–10.3)
Chloride: 105 mmol/L (ref 98–111)
Creatinine: 1.01 mg/dL (ref 0.61–1.24)
GFR, Estimated: >60 mL/min (ref >60)
Glucose, Bld: 144 mg/dL — ABNORMAL HIGH (ref 70–99)
Potassium: 3.8 mmol/L (ref 3.5–5.1)
Sodium: 137 mmol/L (ref 135–145)
Total Bilirubin: 0.5 mg/dL (ref 0.0–1.2)
Total Protein: 6.6 g/dL (ref 6.5–8.1)

## 2023-07-22 LAB — CBC WITH DIFFERENTIAL (CANCER CENTER ONLY)
Abs Immature Granulocytes: 0.06 10*3/uL (ref 0.00–0.07)
Basophils Absolute: 0.2 10*3/uL — ABNORMAL HIGH (ref 0.0–0.1)
Basophils Relative: 1 %
Eosinophils Absolute: 0 10*3/uL (ref 0.0–0.5)
Eosinophils Relative: 0 %
HCT: 33.6 % — ABNORMAL LOW (ref 39.0–52.0)
Hemoglobin: 11.2 g/dL — ABNORMAL LOW (ref 13.0–17.0)
Immature Granulocytes: 1 %
Lymphocytes Relative: 20 %
Lymphs Abs: 2.3 10*3/uL (ref 0.7–4.0)
MCH: 31.3 pg (ref 26.0–34.0)
MCHC: 33.3 g/dL (ref 30.0–36.0)
MCV: 93.9 fL (ref 80.0–100.0)
Monocytes Absolute: 0.8 10*3/uL (ref 0.1–1.0)
Monocytes Relative: 7 %
Neutro Abs: 8.2 10*3/uL — ABNORMAL HIGH (ref 1.7–7.7)
Neutrophils Relative %: 71 %
Platelet Count: 249 10*3/uL (ref 150–400)
RBC: 3.58 MIL/uL — ABNORMAL LOW (ref 4.22–5.81)
RDW: 17.1 % — ABNORMAL HIGH (ref 11.5–15.5)
WBC Count: 11.5 10*3/uL — ABNORMAL HIGH (ref 4.0–10.5)
nRBC: 0 % (ref 0.0–0.2)

## 2023-07-22 LAB — TSH: TSH: 5.07 u[IU]/mL — ABNORMAL HIGH (ref 0.350–4.500)

## 2023-07-22 NOTE — Progress Notes (Signed)
 [facility] [phone]   [phone]  OFFICE PROGRESS NOTE  Adelia Homestead, MD [address] [place] [address]  DIAGNOSIS: Metastatic non-small cell lung cancer initially diagnosed as stage IIIA (T4, N0, M0) non-small cell lung cancer, favoring adenocarcinoma presented with large right lower lobe lung mass with no mediastinal lymphadenopathy or extrathoracic metastasis diagnosed in October 2022. Had disease progression with enlarging mass and new right hilar and mediastinal lymph nodes in December 2022.      PRIOR THERAPY:  1) Neoadjuvant treatment with carboplatin  for AUC of 5, Alimta 500 Mg/M2 and nivolumab  360 Mg IV every 3 weeks.  First dose January 23, 2021.  Status post 3 cycles. 2) Concurrent chemoradiation with carboplatin  for an AUC of 2 and paclitaxel  45 mg per metered squared.  First dose expected on 04/01/2021.  Status post 4 cycles 3) Consolidation treatment with immunotherapy with Imfinzi  1500 Mg IV every 4 weeks.  First dose on Jul 08, 2021.  Status post 9 cycles.  4) Palliative systemic chemotherapy with carboplatin  for an AUC of 5, Alimta 500 mg/m, Keytruda  200 mg IV every 3 weeks. First dose on 05/13/2022.  Status post 12 cycles.  Starting him cycle #3 his dose of carboplatin  will be reduced to AUC of 4 and Alimta 400 Mg/M2 secondary to intolerance.  Last dose was given January 26, 2023 discontinued secondary to disease progression.    CURRENT THERAPY: Second line systemic chemotherapy with docetaxel  65 Mg/M2 and ramucirumab  10 Mg/KG every 3 weeks with Neulasta  support.  First dose April 08, 2023.  Status post 5  cycles.  His treatment is currently on hold secondary to toxicity and recent hospitalization.  INTERVAL HISTORY: Brandon Rubio. 73 y.o. adult returns to the clinic today for follow-up visit. Discussed the use of AI scribe software for clinical note transcription with the patient, who gave verbal consent  to proceed.  History of Present Illness   Brandon Rubio. is a 73 year old male with metastatic non-small cell lung cancer who presents for follow-up after recent hospitalization for chemotherapy-induced neutropenia.  He has a history of metastatic non-small cell lung cancer, adenocarcinoma, initially diagnosed in October 2022 as stage IIIA. The disease showed progression in December 2022, and he has undergone several treatments. He is currently on second-line systemic chemotherapy.  Recently, he was hospitalized for seven days due to chemotherapy-induced neutropenia. During this hospitalization, he experienced delirium and agitation, attempting to remove medical equipment. His white blood cell count dropped significantly, necessitating treatment with GCSF to boost his count. He received short-acting GCSF in the hospital as he previously did not tolerate Neulasta  well. His white blood cell count recovered.  During the hospital stay, he underwent two scans of the chest, abdomen, and pelvis. The first scan was done without contrast, and the second with contrast. Both scans showed no progression of his cancer.  He has just started walking again two days ago, having been unable to walk due to issues with his hips. He wants to avoid returning to the hospital.  He inquires about his medication, specifically Claritin , which was removed from his list during the hospital stay.        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  MEDICATIONS:  Current Outpatient Medications  Medication Sig Dispense Refill   aspirin  EC 81 MG tablet Take 81 mg by mouth in the morning. Swallow whole.     dexamethasone  (DECADRON ) 0.75 MG tablet Take 1 tablet (0.75 mg total) by mouth daily. 100 tablet 1   feeding supplement (ENSURE ENLIVE / ENSURE PLUS) LIQD Take 237 mLs by mouth 3 (three) times daily between meals. 237 mL 12   HYDROcodone -acetaminophen  (NORCO) 10-325 MG tablet Take 1 tablet by mouth every 12 (twelve) hours as needed. 60 tablet 0   lidocaine  (LIDODERM ) 5 % Place 1 patch onto the skin daily. Remove & Discard patch within 12 hours or as directed by MD 30 patch 0   magnesium  oxide (MAG-OX) 400 (240 Mg) MG tablet Take 1 tablet (400 mg total) by mouth 2 (two) times daily. 30 tablet 1   metFORMIN  (GLUCOPHAGE ) 1000 MG tablet TAKE 1 TABLET(1000 MG) BY MOUTH TWICE DAILY WITH A MEAL 180 tablet 0   metoprolol  tartrate (LOPRESSOR ) 25 MG tablet Take 1 tablet (25 mg total) by mouth 2 (two) times daily. 180 tablet 2   Multiple Vitamins-Minerals (CENTRUM SILVER 50+MEN) TABS Take 1 tablet by mouth daily with breakfast.     nicotine  (NICODERM CQ  - DOSED IN MG/24 HOURS) 14 mg/24hr patch Place 1 patch (14 mg total) onto the skin daily. 28 patch 0   pantoprazole  (PROTONIX ) 40 MG tablet Take 1 tablet (40 mg total) by mouth daily. 30 tablet 1   simvastatin  (ZOCOR ) 40 MG tablet TAKE 1 TABLET BY MOUTH EVERY EVENING AT 6 PM. (Patient taking differently: Take 40 mg by mouth in the morning.) 90 tablet 3   No current facility-administered medications for this visit.    SURGICAL HISTORY:  Past Surgical History:  Procedure Laterality Date   BRONCHIAL BIOPSY  12/10/2020   Procedure: BRONCHIAL BIOPSIES;  Surgeon: Denson Flake, MD;  Location: [facility] ENDOSCOPY;  Service: Pulmonary;;   BRONCHIAL BRUSHINGS  12/10/2020   Procedure: BRONCHIAL BRUSHINGS;  Surgeon: Denson Flake, MD;  Location: [facility] ENDOSCOPY;   Service: Pulmonary;;   BRONCHIAL NEEDLE ASPIRATION BIOPSY  12/10/2020   Procedure: BRONCHIAL NEEDLE ASPIRATION BIOPSIES;  Surgeon: Denson Flake, MD;  Location: [facility] ENDOSCOPY;  Service: Pulmonary;;   COLONOSCOPY     COLONOSCOPY W/ POLYPECTOMY  03/10/2009   LARYNGOSCOPY  03/10/1997   VIDEO BRONCHOSCOPY WITH ENDOBRONCHIAL NAVIGATION N/A 12/10/2020   Procedure: ROBOTIC VIDEO BRONCHOSCOPY WITH ENDOBRONCHIAL NAVIGATION;  Surgeon: Denson Flake, MD;  Location: [facility] ENDOSCOPY;  Service: Pulmonary;  Laterality: N/A;    REVIEW OF SYSTEMS:  Constitutional: positive for fatigue Eyes: negative Ears, nose, mouth, throat, and face: negative Respiratory: positive for cough Cardiovascular: negative Gastrointestinal: negative Genitourinary:negative Integument/breast: negative Hematologic/lymphatic: negative Musculoskeletal:negative Neurological: negative Behavioral/Psych: negative Endocrine: negative Allergic/Immunologic: negative   PHYSICAL EXAMINATION: General appearance: alert, cooperative, fatigued, and no distress Head: Normocephalic, without obvious abnormality, atraumatic Neck: no adenopathy, no JVD, supple, symmetrical, trachea midline, and thyroid  not enlarged, symmetric, no tenderness/mass/nodules Lymph nodes: Cervical, supraclavicular, and axillary nodes normal. Resp: clear to auscultation bilaterally Back: symmetric, no curvature. ROM normal. No CVA tenderness. Cardio: regular rate and  rhythm, S1, S2 normal, no murmur, click, rub or gallop GI: soft, non-tender; bowel sounds normal; no masses,  no organomegaly Extremities: extremities normal, atraumatic, no cyanosis or edema Neurologic: Alert and oriented X 3, normal strength and tone. Normal symmetric reflexes. Normal coordination and gait  ECOG PERFORMANCE STATUS: 1 - Symptomatic but completely ambulatory  Blood pressure 126/70, pulse 82, temperature (!) 97.3 F (36.3 C), temperature source Temporal, resp. rate 17, height 6'  (1.829 m), weight 189 lb (85.7 kg), SpO2 96%.  LABORATORY DATA: Lab Results  Component Value Date   WBC 11.5 (H) 07/22/2023   HGB 11.2 (L) 07/22/2023   HCT 33.6 (L) 07/22/2023   MCV 93.9 07/22/2023   PLT 249 07/22/2023      Chemistry      Component Value Date/Time   NA 137 07/22/2023 0842   NA 137 10/24/2022 1540   K 3.8 07/22/2023 0842   CL 105 07/22/2023 0842   CO2 24 07/22/2023 0842   BUN 13 07/22/2023 0842   BUN 9 10/24/2022 1540   CREATININE 1.01 07/22/2023 0842      Component Value Date/Time   CALCIUM  8.6 (L) 07/22/2023 0842   ALKPHOS 52 07/22/2023 0842   AST 16 07/22/2023 0842   ALT 13 07/22/2023 0842   BILITOT 0.5 07/22/2023 0842       RADIOGRAPHIC STUDIES: EEG adult Result Date: 07/10/2023 Arleene Lack, MD     07/10/2023  2:38 PM Patient Name: Brandon Rubio. MRN: [id number] Epilepsy Attending: Arleene Lack Referring Physician/Provider: Kraig Peru, MD Date: 07/10/2023 Duration: 23.29 mins Patient history: 73yo M with ams. EEG to evaluate for seizure Level of alertness: Awake AEDs during EEG study: None Technical aspects: This EEG study was done with scalp electrodes positioned according to the 10-20 International system of electrode placement. Electrical activity was reviewed with band pass filter of 1-70Hz , sensitivity of 7 uV/mm, display speed of 34mm/sec with a 60Hz  notched filter applied as appropriate. EEG data were recorded continuously and digitally stored.  Video monitoring was available and reviewed as appropriate. Description: The posterior dominant rhythm consists of 8 Hz activity of moderate voltage (25-35 uV) seen predominantly in posterior head regions, symmetric and reactive to eye opening and eye closing. EEG showed intermittent generalized 3 to 6 Hz theta-delta slowing. Hyperventilation and photic stimulation were not performed.   ABNORMALITY - Intermittent slow, generalized IMPRESSION: This study is suggestive of mild diffuse  encephalopathy. No seizures or epileptiform discharges were seen throughout the recording. Arleene Lack   ECHOCARDIOGRAM COMPLETE Result Date: 07/07/2023    ECHOCARDIOGRAM REPORT   Patient Name:   Coral Krammes. Date of Exam: 07/07/2023 Medical Rec #:  [id number]             Height:       72.0 in Accession #:    [id number]            Weight:       200.0 lb Date of Birth:  March 23, 1950              BSA:          2.131 m Patient Age:    73 years              BP:           129/80 mmHg Patient Gender: M                     HR:  81 bpm. Exam Location:  Inpatient Procedure: 2D Echo (Both Spectral and Color Flow Doppler were utilized during            procedure). Indications:    dyspnea  History:        Patient has prior history of Echocardiogram examinations, most                 recent 10/06/2022. CAD, lung cancer, Signs/Symptoms:Dyspnea; Risk                 Factors:Hypertension, Dyslipidemia, Diabetes and Current Smoker.  Sonographer:    Dione Franks RDCS Referring Phys: [id number] TAYLOR A PARCELLS IMPRESSIONS  1. No left ventricular thrombus is seen. Left ventricular ejection fraction, by estimation, is 55 to 60%. The left ventricle has normal function. The left ventricle demonstrates regional wall motion abnormalities (see scoring diagram/findings for description). Left ventricular diastolic parameters are consistent with Grade I diastolic dysfunction (impaired relaxation). There is moderate hypokinesis of the left ventricular, apical septal wall, anterior wall and inferior wall.  2. Right ventricular systolic function is normal. The right ventricular size is normal. There is normal pulmonary artery systolic pressure.  3. The mitral valve is normal in structure. No evidence of mitral valve regurgitation. No evidence of mitral stenosis.  4. The aortic valve is tricuspid. There is mild calcification of the aortic valve. Aortic valve regurgitation is not visualized. Aortic valve sclerosis is present,  with no evidence of aortic valve stenosis.  5. There is borderline dilatation of the ascending aorta, measuring 38 mm.  6. The inferior vena cava is normal in size with greater than 50% respiratory variability, suggesting right atrial pressure of 3 mmHg. Comparison(s): No significant change from prior study. Prior images reviewed side by side. Retrospectively, the area of apical hypokinesis, consistent with distal LAD artery territory infarction, was also present on the 2023 and 2024 studies. FINDINGS  Left Ventricle: No left ventricular thrombus is seen. Left ventricular ejection fraction, by estimation, is 55 to 60%. The left ventricle has normal function. The left ventricle demonstrates regional wall motion abnormalities. Moderate hypokinesis of the left ventricular, apical septal wall, anterior wall and inferior wall. Definity  contrast agent was given IV to delineate the left ventricular endocardial borders. The left ventricular internal cavity size was normal in size. There is no left ventricular hypertrophy. Left ventricular diastolic parameters are consistent with Grade I diastolic dysfunction (impaired relaxation). Normal left ventricular filling pressure.  LV Wall Scoring: The apical septal segment, apical inferior segment, and apex are hypokinetic. Right Ventricle: The right ventricular size is normal. No increase in right ventricular wall thickness. Right ventricular systolic function is normal. There is normal pulmonary artery systolic pressure. The tricuspid regurgitant velocity is 2.20 m/s, and  with an assumed right atrial pressure of 3 mmHg, the estimated right ventricular systolic pressure is 22.4 mmHg. Left Atrium: Left atrial size was normal in size. Right Atrium: Right atrial size was normal in size. Pericardium: There is no evidence of pericardial effusion. Mitral Valve: The mitral valve is normal in structure. No evidence of mitral valve regurgitation. No evidence of mitral valve stenosis.  Tricuspid Valve: The tricuspid valve is normal in structure. Tricuspid valve regurgitation is trivial. Aortic Valve: The aortic valve is tricuspid. There is mild calcification of the aortic valve. Aortic valve regurgitation is not visualized. Aortic valve sclerosis is present, with no evidence of aortic valve stenosis. Pulmonic Valve: The pulmonic valve was not well visualized. Pulmonic valve regurgitation is not visualized. No  evidence of pulmonic stenosis. Aorta: The aortic root is normal in size and structure. There is borderline dilatation of the ascending aorta, measuring 38 mm. Venous: The inferior vena cava is normal in size with greater than 50% respiratory variability, suggesting right atrial pressure of 3 mmHg. IAS/Shunts: The interatrial septum was not well visualized.  LEFT VENTRICLE PLAX 2D LVIDd:         4.60 cm   Diastology LVIDs:         3.20 cm   LV e' medial:    5.22 cm/s LV PW:         1.10 cm   LV E/e' medial:  9.8 LV IVS:        1.00 cm   LV e' lateral:   5.98 cm/s LVOT diam:     2.20 cm   LV E/e' lateral: 8.6 LV SV:         79 LV SV Index:   37 LVOT Area:     3.80 cm  RIGHT VENTRICLE RV S prime:     11.60 cm/s LEFT ATRIUM             Index LA diam:        3.80 cm 1.78 cm/m LA Vol (A2C):   35.8 ml 16.80 ml/m LA Vol (A4C):   62.7 ml 29.43 ml/m LA Biplane Vol: 50.8 ml 23.84 ml/m  AORTIC VALVE LVOT Vmax:   104.00 cm/s LVOT Vmean:  67.100 cm/s LVOT VTI:    0.207 m  AORTA Ao Root diam: 3.70 cm Ao Asc diam:  3.80 cm MITRAL VALVE               TRICUSPID VALVE MV Area (PHT): 3.42 cm    TR Peak grad:   19.4 mmHg MV Decel Time: 222 msec    TR Vmax:        220.00 cm/s MV E velocity: 51.40 cm/s MV A velocity: 63.90 cm/s  SHUNTS MV E/A ratio:  0.80        Systemic VTI:  0.21 m                            Systemic Diam: 2.20 cm Karyl Paget Croitoru MD Electronically signed by Luana Rumple MD Signature Date/Time: 07/07/2023/10:57:12 AM    Final    CT CHEST ABDOMEN PELVIS W CONTRAST Result Date:  07/06/2023 CLINICAL DATA:  Pneumonia, complications suspected, with abdominal pain, history of non-small cell lung cancer with treatment, sepsis workup. EXAM: CT CHEST, ABDOMEN, AND PELVIS WITH CONTRAST TECHNIQUE: Multidetector CT imaging of the chest, abdomen and pelvis was performed following the standard protocol during bolus administration of intravenous contrast. RADIATION DOSE REDUCTION: This exam was performed according to the departmental dose-optimization program which includes automated exposure control, adjustment of the mA and/or kV according to patient size and/or use of iterative reconstruction technique. CONTRAST:  75mL OMNIPAQUE  IOHEXOL  350 MG/ML SOLN COMPARISON:  CT chest, abdomen and pelvis without contrast earlier today at 1:03 a.m., and CT chest, abdomen and pelvis with IV contrast 06/03/2023. FINDINGS: CT CHEST FINDINGS Cardiovascular: The cardiac size is normal. There is patchy left main and three-vessel coronary artery calcification. No pericardial effusion. There is trace air anteriorly in the pulmonary trunk and right ventricle probably iatrogenic. Aortic and minimal great vessel atherosclerosis without aneurysm, stenosis or dissection. The great vessels are widely patent. The pulmonary arteries and veins are normal in caliber. The pulmonary arteries are centrally clear. Mediastinum/Nodes: No  enlarged intrathoracic or axillary lymph nodes. The trachea and main bronchi are clear. Unremarkable thyroid  gland, thoracic esophagus. Lungs/Pleura: Small right and minimal left layering pleural effusions, unchanged. Centrilobular and paraseptal emphysematous changes are again noted, predominating in the upper lobes. Multifocal ground-glass nodules/masses on the left greater than right consistent with multifocal adenocarcinoma, are again noted. Largest again measuring 6.5 x 5.1 cm in the left lower lobe (4: 81 and 84). Volume loss and sizable rounded consolidation in the right lower lobe with  calcifications appears similar to prior studies, consistent with treated neoplasm and possible chronic aspiration component. There are again small ground-glass nodules in the nonconsolidated portion of the right lower lobe. Additional ground-glass nodules in the left upper lobe most confluent at the hilar structures, for example measuring 3.8 x 3.5 cm on 4:72, previously 3.4 x 1 7 cm. There is a ground-glass nodule in the posterior right upper lobe today measuring 1.8 cm on 4:88, 1 month ago was 1.6 cm. No acute airspace infiltrate is seen. Musculoskeletal: No regional bone metastasis is seen. CT ABDOMEN PELVIS FINDINGS Hepatobiliary: The liver is mildly steatotic. No mass is seen. Gallbladder is contracted but without inflammatory changes or stones. No biliary dilatation. Pancreas: No abnormality. Spleen: No abnormality. Adrenals/Urinary Tract: There is no adrenal mass. No mass enhancement in either kidney. There is chronic perinephric stranding which appears stable. There is a 3.7 cm Bosniak 1 cyst in the posterior left kidney, Hounsfield density is -0.6. No follow-up imaging is recommended. There is no urinary stone or obstruction. The bladder is unremarkable. Stomach/Bowel: No bowel dilatation or wall thickening including the appendix. There is fluid in the colon compatible with diarrhea. There are chronic thickened folds in the stomach. Vascular/Lymphatic: Aortic atherosclerosis. No enlarged abdominal or pelvic lymph nodes. 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 No AAA. Reproductive: Prostate is unremarkable. Other: Minimal ascites, new from last month. No free air, free hemorrhage, abscess or incarcerated hernia. Musculoskeletal: No regional bone metastasis is seen. IMPRESSION: 1. No acute chest CT findings. No findings in the abdomen and pelvis suggesting a site for sepsis. 2. Stable small right and minimal left pleural effusions. 3. Emphysema. 4. Multifocal ground-glass  nodules/masses on the left greater than right consistent with multifocal adenocarcinoma. Some lesions progressive from 06/03/2023. 5. Stable volume loss and rounded consolidation in the right lower lobe with calcifications, consistent with treated neoplasm and possible chronic aspiration component. 6. Increased size of ground-glass nodules in the left and right upper lobe. 7. Aortic and coronary artery atherosclerosis. 8. Minimal ascites, new from last month. 9. Fluid in the colon compatible with diarrhea. No bowel obstruction or inflammation. 10. Chronic thickened folds in the stomach. 11. 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 Aortic Atherosclerosis (ICD10-I70.0) and Emphysema (ICD10-J43.9). Electronically Signed   By: Denman Fischer M.D.   On: 07/06/2023 22:46   CT CHEST ABDOMEN PELVIS WO CONTRAST Result Date: 07/06/2023 EXAM: CT CHEST, ABDOMEN AND PELVIS WITHOUT CONTRAST 07/06/2023 01:19:14 AM TECHNIQUE: CT of the chest, abdomen and pelvis was performed Multiplanar reformatted images are provided for review. Automated exposure control, iterative reconstruction, and/or weight based adjustment of the mA/kV was utilized to reduce the radiation dose to as low as reasonably achievable. COMPARISON: 06/03/2023 CLINICAL HISTORY: Pneumonia, complication suspected, xray done; Abdominal pain, acute, nonlocalized. History of lung cancer. FINDINGS: MEDIASTINUM: Mild 3-vessel coronary atherosclerosis. Mild atherosclerotic calcification of the aortic arch. LYMPH NODES: No evidence of mediastinal, hilar or axillary lymphadenopathy. LUNGS  AND PLEURA: Radiation changes/scarring in the right lower lobe with suspected superimposed aspiration, chronic. Scattered multifocal ground glass nodules/masses, measuring up to 5.7 cm in the left lower lobe (image 72), grossly unchanged and compatible with known multifocal adenocarcinoma. Mild centrilobular and paraseptal emphysematous changes, upper lung  predominant. Trace right pleural effusion, chronic. HEPATOBILIARY: The liver is unremarkable. Gallbladder is unremarkable. No biliary ductal dilatation. SPLEEN: No acute abnormality. PANCREAS: No acute abnormality. ADRENAL GLANDS: No acute abnormality. KIDNEYS, URETERS AND BLADDER: 3.6 cm simple left renal cyst (image 53), benign, no follow-up is recommended. No stones in the kidneys or ureters. No evidence of hydronephrosis. No evidence of perinephric or periureteral stranding. GI AND BOWEL: Stomach demonstrates no acute abnormality. There is no evidence of bowel obstruction. No evidence of appendicitis. REPRODUCTIVE: Reproductive organs are unremarkable. PERITONEUM AND RETROPERITONEUM: No evidence of ascites or free air. Atherosclerotic calcifications of the abdominal aorta and branch vessels. LYMPH NODES: No evidence of lymphadenopathy. BONES AND SOFT TISSUES: No acute osseous abnormality. No focal soft tissue abnormality. Incidental adrenal and/or renal findings do not require follow up imaging. IMPRESSION: 1. Stable multifocal adenocarcinoma. 2. Radiation changes/scarring in the right lower lobe with suspected superimposed aspiration. 3. Trace right pleural effusion, chronic. 4. No acute findings in the abdomen/pelvis. Electronically signed by: Zadie Herter MD 07/06/2023 01:31 AM EDT RP Workstation: [id number]   CT Head Wo Contrast Result Date: 07/05/2023 CLINICAL DATA:  Mental status change, unknown cause EXAM: CT HEAD WITHOUT CONTRAST TECHNIQUE: Contiguous axial images were obtained from the base of the skull through the vertex without intravenous contrast. RADIATION DOSE REDUCTION: This exam was performed according to the departmental dose-optimization program which includes automated exposure control, adjustment of the mA and/or kV according to patient size and/or use of iterative reconstruction technique. COMPARISON:  05/10/2021. FINDINGS: Brain: There is periventricular white matter decreased  attenuation consistent with small vessel ischemic changes. Ventricles, sulci and cisterns are prominent consistent with age related involutional changes. No acute intracranial hemorrhage, mass effect or shift. No hydrocephalus. Vascular: No hyperdense vessel or unexpected calcification. Skull: Normal. Negative for fracture or focal lesion. Sinuses/Orbits: No acute finding. IMPRESSION: Atrophy and chronic small vessel ischemic changes. No acute intracranial process identified. Electronically Signed   By: Sydell Eva M.D.   On: 07/05/2023 19:58   DG Chest Portable 1 View Result Date: 07/05/2023 CLINICAL DATA:  Altered mental status.  Found down. EXAM: PORTABLE CHEST 1 VIEW COMPARISON:  10/02/2022. FINDINGS: Dense consolidation at the left base is somewhat mass-like appearing. The this could represent pneumonia and follow up is recommended to confirm resolution. There is linear subsegmental atelectasis or scarring in the right perihilar region. There is density consistent with consolidation or volume loss in the medial right base. No pneumothorax. No definite pleural effusion. Normal pulmonary vasculature. IMPRESSION: Bibasilar opacities, left greater than right. Follow-up recommended to confirm resolution. Electronically Signed   By: Sydell Eva M.D.   On: 07/05/2023 19:56     ASSESSMENT AND PLAN: This is a very pleasant 73 years old white male with history of a stage IIIA (T4, N1, M0) non-small cell lung cancer, adenocarcinoma presented with right lower lobe lung mass with a right hilar and no mediastinal lymphadenopathy and no evidence of extrathoracic disease.  The patient was diagnosed in October 2022 status post 3 cycles of neoadjuvant systemic chemotherapy with carboplatin , Alimta and nivolumab  but unfortunately has no evidence for significant improvement of his disease and actually has some evidence for progression. The patient underwent a course of  concurrent chemoradiation weekly carboplatin   for AUC of 2 and paclitaxel  45 Mg/M2.  Status post 4 cycles. The patient had a rough time with the course of concurrent chemoradiation and he was hospitalized several times during this course. Imaging studies after the course of concurrent chemoradiation showed no concerning findings for disease progression. The patient underwent treatment with immunotherapy with Imfinzi  1500 Mg IV every 4 weeks status post 9 cycles.  This treatment was discontinued secondary to disease progression. The patient is currently on systemic chemotherapy with carboplatin , Alimta and Keytruda  status post 12 cycles.  Starting from cycle #5 he has been on treatment with maintenance Alimta and Keytruda  every 3 weeks.  The patient has been tolerating this treatment fairly well.  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  Repeat CT scan of the chest showed the diffuse opacity in the lungs bilaterally more on the left suspicious for disease progression rather than immunotherapy mediated pneumonitis. The patient had a PET scan performed recently and it showed multifocal adenocarcinoma progressive from comparison examination. He is currently on systemic chemotherapy with reduced dose docetaxel  65 Mg/M2 and ramucirumab  10 Mg/KG every 3 weeks status post 5 cycles. The Neulasta  injection was discontinued secondary to intolerance.  His treatment is currently on hold secondary to toxicity and recent hospitalization. He had repeat CT scan of the chest, abdomen and pelvis during his hospitalization that showed stable disease. I had a lengthy discussion with the patient today about his current condition and treatment options.    Metastatic non-small cell lung cancer Metastatic non-small cell lung cancer, adenocarcinoma, initially diagnosed in October 2022 as stage IIIA with disease progression in December 2022. Currently on  second-line systemic chemotherapy. Recent scans indicate disease control with no progression. Recent hospitalization for chemotherapy-induced neutropenia prompted discussion of a chemotherapy break to recover strength. Given stable scan results, a temporary cessation of chemotherapy is considered to allow recovery and improve quality of life. - Hold chemotherapy for 2-3 months to allow recovery and build stamina. - Monitor for signs of disease progression or concerning symptoms such as hemoptysis. - Encourage physical activity to improve strength and stamina. - Schedule follow-up in 3 months or sooner if symptoms worsen.  Chemotherapy-induced neutropenia Recent hospitalization due to chemotherapy-induced neutropenia, leading to delirium and agitation. Treated with GCSF in the hospital, resulting in recovery of white blood cell count. Previously declined Neulasta , necessitating short-acting GCSF during hospitalization. - Discontinue Claritin  as it is no longer needed without ongoing chemotherapy or GCSF injections.   The patient was advised to call immediately if he has any other concerning symptoms in the interval. The patient voices understanding of current disease status and treatment options and is in agreement with the current care plan.  All questions were answered. The patient knows to call the clinic with any problems, questions or concerns. We can certainly see the patient much sooner if necessary.   Disclaimer: This note was dictated with voice recognition software. Similar sounding words can inadvertently be transcribed and may not be corrected upon review.

## 2023-07-24 ENCOUNTER — Ambulatory Visit

## 2023-07-24 DIAGNOSIS — D630 Anemia in neoplastic disease: Secondary | ICD-10-CM | POA: Diagnosis not present

## 2023-07-24 DIAGNOSIS — C3491 Malignant neoplasm of unspecified part of right bronchus or lung: Secondary | ICD-10-CM | POA: Diagnosis not present

## 2023-07-24 DIAGNOSIS — E1165 Type 2 diabetes mellitus with hyperglycemia: Secondary | ICD-10-CM | POA: Diagnosis not present

## 2023-07-24 DIAGNOSIS — I10 Essential (primary) hypertension: Secondary | ICD-10-CM | POA: Diagnosis not present

## 2023-07-24 DIAGNOSIS — I700 Atherosclerosis of aorta: Secondary | ICD-10-CM | POA: Diagnosis not present

## 2023-07-24 DIAGNOSIS — D61810 Antineoplastic chemotherapy induced pancytopenia: Secondary | ICD-10-CM | POA: Diagnosis not present

## 2023-07-24 LAB — T4: T4, Total: 10.5 ug/dL (ref 4.5–12.0)

## 2023-07-29 ENCOUNTER — Inpatient Hospital Stay

## 2023-07-31 ENCOUNTER — Encounter: Payer: Self-pay | Admitting: Internal Medicine

## 2023-07-31 ENCOUNTER — Telehealth (INDEPENDENT_AMBULATORY_CARE_PROVIDER_SITE_OTHER): Payer: Self-pay | Admitting: Internal Medicine

## 2023-07-31 DIAGNOSIS — E118 Type 2 diabetes mellitus with unspecified complications: Secondary | ICD-10-CM | POA: Diagnosis not present

## 2023-07-31 DIAGNOSIS — C3491 Malignant neoplasm of unspecified part of right bronchus or lung: Secondary | ICD-10-CM | POA: Diagnosis not present

## 2023-07-31 DIAGNOSIS — K219 Gastro-esophageal reflux disease without esophagitis: Secondary | ICD-10-CM

## 2023-07-31 MED ORDER — ALBUTEROL SULFATE HFA 108 (90 BASE) MCG/ACT IN AERS: 2.0000 | INHALATION_SPRAY | Freq: Four times a day (QID) | RESPIRATORY_TRACT | 2 refills | Status: DC | PRN

## 2023-07-31 MED ORDER — TRELEGY ELLIPTA 100-62.5-25 MCG/ACT IN AEPB
1.0000 | INHALATION_SPRAY | Freq: Every day | RESPIRATORY_TRACT | 11 refills | Status: DC
Start: 2023-07-31 — End: 2023-12-29

## 2023-07-31 MED ORDER — PANTOPRAZOLE SODIUM 40 MG PO TBEC: 40.0000 mg | DELAYED_RELEASE_TABLET | Freq: Every day | ORAL | 3 refills | Status: AC

## 2023-07-31 MED ORDER — MAGNESIUM OXIDE -MG SUPPLEMENT 400 (240 MG) MG PO TABS: 400.0000 mg | ORAL_TABLET | Freq: Two times a day (BID) | ORAL | 3 refills | Status: DC

## 2023-07-31 NOTE — Assessment & Plan Note (Signed)
 He was changed to protonix  in the hospital. Rx done for 90 day fill.

## 2023-07-31 NOTE — Progress Notes (Signed)
 Virtual Visit via Video Note  I connected with Brandon Rubio. on 07/31/23 at 11:40 AM EDT by a video enabled telemedicine application and verified that I am speaking with the correct person using two identifiers.  The patient and the provider were at separate locations throughout the entire encounter. Patient location: home, Provider location: work   I discussed the limitations of evaluation and management by telemedicine and the availability of in person appointments. The patient expressed understanding and agreed to proceed. The patient and the provider were the only parties present for the visit unless noted in HPI below.  History of Present Illness: The patient is a 73 y.o. man with visit for hospital follow up. He is still having ankle swelling. Is coughing some about normal.   Observations/Objective: Appearance: speaking in full sentences, breathing appears at baseline some coughing during visit, casual grooming, memory normal, mental status is A and O times 3  Assessment and Plan: See problem oriented charting  Follow Up Instructions: refill magnesium  and protonix  for 90 day fill. Can refill hydrocodone  when due.   I discussed the assessment and treatment plan with the patient. The patient was provided an opportunity to ask questions and all were answered. The patient agreed with the plan and demonstrated an understanding of the instructions.   The patient was advised to call back or seek an in-person evaluation if the symptoms worsen or if the condition fails to improve as anticipated.  Adelia Homestead, MD

## 2023-07-31 NOTE — Assessment & Plan Note (Signed)
 Gets labs with endo/oncology and overall stable. Reduction of dosing steroids has helped.

## 2023-07-31 NOTE — Assessment & Plan Note (Signed)
 Refilled magnesium  supplement for 90 day fill and needs to stay on indefinitely.

## 2023-07-31 NOTE — Assessment & Plan Note (Signed)
 Uses hydrocodone  for cancer related pain. PDMP reviewed and appropriate. Can continue filling #60 per month when due.

## 2023-08-05 ENCOUNTER — Inpatient Hospital Stay

## 2023-08-06 ENCOUNTER — Encounter: Payer: Self-pay | Admitting: Cardiology

## 2023-08-06 ENCOUNTER — Ambulatory Visit: Payer: Medicare Other | Attending: Cardiology | Admitting: Cardiology

## 2023-08-06 VITALS — BP 120/68 | HR 83 | Ht 72.0 in | Wt 202.0 lb

## 2023-08-06 DIAGNOSIS — I2510 Atherosclerotic heart disease of native coronary artery without angina pectoris: Secondary | ICD-10-CM | POA: Diagnosis not present

## 2023-08-06 DIAGNOSIS — E785 Hyperlipidemia, unspecified: Secondary | ICD-10-CM | POA: Insufficient documentation

## 2023-08-06 DIAGNOSIS — I471 Supraventricular tachycardia, unspecified: Secondary | ICD-10-CM | POA: Diagnosis not present

## 2023-08-06 NOTE — Progress Notes (Signed)
 Cardiology Office Note:  .   Date:  08/06/2023  ID:  Brandon Chen., DOB [birth year], MRN [id number] PCP: Adelia Homestead, MD  [facility] Providers Cardiologist:  Dorothye Gathers, MD     History of Present Illness: .   Brandon Stallone Jr. is a 73 y.o. adult Discussed the use of AI scribe software for clinical note transcription with the patient, who gave verbal consent to proceed.  History of Present Illness Brandon Rubio. is a 73 year old male with a history of non-ST elevation myocardial infarction who presents for cardiovascular follow-up.  He has a history of a non-ST elevation myocardial infarction in 2009, with a catheterization revealing a 99% distal LAD stenosis, which was managed medically. He is currently on metoprolol  25 mg twice daily to manage episodes of tachycardia, with heart rates previously reaching up to 220 bpm. He feels 'pretty good' with the current regimen.  He was hospitalized recently for pneumonia and sepsis, during which he experienced tachycardia. He is on simvastatin  40 mg daily for hyperlipidemia, with an LDL of 82. He also takes aspirin , which was on hold due to low platelet counts (79,000) during his hospital stay, but he has resumed it as he is not experiencing any bleeding issues.  He has a history of non-small cell lung cancer for which he has received radiation therapy. He reports that the radiation 'cooked my adrenal glands', leading to adrenal insufficiency. He is currently on prednisone  to manage this condition and notes that missing doses can lead to significant drops in blood pressure. He mentions that he sometimes finds pills on the floor, indicating challenges with medication adherence.  He reports weakness from the hips down, which he attributes to prolonged bed rest during his recent hospitalization. He is retired and mentions being available for follow-up appointments as needed. No current bleeding issues, such as  hemoptysis.      ROS: No CP, no SOB  Studies Reviewed: .        Results LABS Troponin: 40 (07/08/2023) PLT: 79,000 (07/08/2023) LDL: 82 (07/08/2023) Hb: 11.2 (07/08/2023) Cr: 1.0 (07/08/2023)  DIAGNOSTIC Echocardiogram: Ejection fraction 55-60%, normal systolic function (07/08/2023) Telemetry monitor: Sinus rhythm with PVCs (07/08/2023) Monitor strip: SVT, 171 bpm (09/2022) Risk Assessment/Calculations:            Physical Exam:   VS:  BP 120/68   Pulse 83   Ht 6' (1.829 m)   Wt 202 lb (91.6 kg)   SpO2 93%   BMI 27.40 kg/m    Wt Readings from Last 3 Encounters:  08/06/23 202 lb (91.6 kg)  07/22/23 189 lb (85.7 kg)  07/12/23 192 lb 7.4 oz (87.3 kg)    GEN: Well nourished, well developed in no acute distress NECK: No JVD; No carotid bruits CARDIAC: RRR, no murmurs, no rubs, no gallops RESPIRATORY:  Clear to auscultation without rales, wheezing or rhonchi  ABDOMEN: Soft, non-tender, non-distended EXTREMITIES:  Chronic 2+ LE edema; No deformity   ASSESSMENT AND PLAN: .    Assessment and Plan Assessment & Plan Supraventricular tachycardia (SVT) in setting of PNA/sepsis.  SVT with heart rate reaching 220 bpm, currently managed with metoprolol  25 mg twice daily. Recent monitor strip showed heart rate of 171 bpm, indicating SVT. Metoprolol  is effective in controlling heart rate. - Continue metoprolol  25 mg twice daily - Schedule follow-up with nurse practitioner or PA in a couple of months  Hypertension Hypertension well-managed with current medication regimen. Blood pressure is stable. - Continue current  hypertension management  Non-ST elevation myocardial infarction (NSTEMI) NSTEMI in 2009 with 99% distal LAD stenosis treated medically. Current cardiac function is stable with normal ejection fraction. - Continue aspirin  as per previous advice - Monitor cardiac function  Non-small cell lung cancer Experiencing watery eyes as a side effect of cancer  medication. Claritin  was ineffective. Radiation treatment has affected adrenal glands. 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 On prednisone  to support blood pressure. Importance of adherence to medication emphasized, especially during illness. Discussed the need to increase prednisone  dose if experiencing vomiting or diarrhea to prevent hypotension and hospitalization. - Continue prednisone  as prescribed - Ensure adherence to medication regimen - Stay hydrated - Increase prednisone  dose if experiencing vomiting or diarrhea  Type 2 diabetes mellitus Type 2 diabetes mellitus mentioned in medical history. No specific discussion in this encounter.  Hyperlipidemia Hyperlipidemia managed with simvastatin . LDL level is 82, which is satisfactory. - Continue simvastatin  40 mg daily  History of sepsis Recent hospitalization for sepsis with normal echocardiogram and stable cardiac function. Platelet count was low at 79,000 but no bleeding symptoms reported. Aspirin  can be resumed as there are no bleeding issues. - Resume aspirin  as per previous advice - Monitor platelet count         Dispo: 6 months Tessa.   Signed, Dorothye Gathers, MD

## 2023-08-06 NOTE — Patient Instructions (Signed)
 Medication Instructions:  Your physician recommends that you continue on your current medications as directed. Please refer to the Current Medication list given to you today.  *If you need a refill on your cardiac medications before your next appointment, please call your pharmacy*  Lab Work: If you have labs (blood work) drawn today and your tests are completely normal, you will receive your results only by: MyChart Message (if you have MyChart) OR A paper copy in the mail If you have any lab test that is abnormal or we need to change your treatment, we will call you to review the results.  Testing/Procedures: None ordered today.  Follow-Up: At [facility], you and your health needs are our priority.  As part of our continuing mission to provide you with exceptional heart care, our providers are all part of one team.  This team includes your primary Cardiologist (physician) and Advanced Practice Providers or APPs (Physician Assistants and Nurse Practitioners) who all work together to provide you with the care you need, when you need it.  Your next appointment:   6 month(s)  Provider:   Lovette Rud, PA-C      Then, Dorothye Gathers, MD will plan to see you again in 1 year(s). or One of our Advanced   We recommend signing up for the patient portal called "MyChart".  Sign up information is provided on this After Visit Summary.  MyChart is used to connect with patients for Virtual Visits (Telemedicine).  Patients are able to view lab/test results, encounter notes, upcoming appointments, etc.  Non-urgent messages can be sent to your provider as well.   To learn more about what you can do with MyChart, go to [email or web address].

## 2023-08-12 ENCOUNTER — Ambulatory Visit: Admitting: Internal Medicine

## 2023-08-12 ENCOUNTER — Ambulatory Visit

## 2023-08-12 ENCOUNTER — Other Ambulatory Visit

## 2023-08-14 ENCOUNTER — Ambulatory Visit

## 2023-08-18 ENCOUNTER — Other Ambulatory Visit: Payer: Self-pay | Admitting: Internal Medicine

## 2023-08-18 MED ORDER — HYDROCODONE-ACETAMINOPHEN 10-325 MG PO TABS: 1.0000 | ORAL_TABLET | Freq: Two times a day (BID) | ORAL | 0 refills | Status: DC | PRN

## 2023-08-18 NOTE — Telephone Encounter (Signed)
 Copied from CRM [id number]. Topic: Clinical - Medication Refill >> Aug 18, 2023  9:25 AM [place] wrote: Medication: HYDROcodone -acetaminophen  (NORCO) 10-325 MG tablet  Has the patient contacted their pharmacy? Yes (Agent: If no, request that the patient contact the pharmacy for the refill. If patient does not wish to contact the pharmacy document the reason why and proceed with request.) (Agent: If yes, when and what did the pharmacy advise?)  This is the patient's preferred pharmacy:  [facility][id number] [place], [place] - [address] AT [facility] [address] [facility] [address] [place] [address] Phone: [phone] Fax: [phone]  Is this the correct pharmacy for this prescription? Yes If no, delete pharmacy and type the correct one.   Has the prescription been filled recently? Yes  Is the patient out of the medication? Yes  Has the patient been seen for an appointment in the last year OR does the patient have an upcoming appointment? Yes  Can we respond through MyChart? Yes  Agent: Please be advised that Rx refills may take up to 3 business days. We ask that you follow-up with your pharmacy.

## 2023-08-19 ENCOUNTER — Inpatient Hospital Stay

## 2023-08-24 ENCOUNTER — Other Ambulatory Visit: Payer: Self-pay | Admitting: Internal Medicine

## 2023-08-24 DIAGNOSIS — C3491 Malignant neoplasm of unspecified part of right bronchus or lung: Secondary | ICD-10-CM

## 2023-08-26 ENCOUNTER — Inpatient Hospital Stay

## 2023-09-02 ENCOUNTER — Other Ambulatory Visit

## 2023-09-02 ENCOUNTER — Ambulatory Visit: Admitting: Internal Medicine

## 2023-09-02 ENCOUNTER — Ambulatory Visit

## 2023-09-04 ENCOUNTER — Other Ambulatory Visit: Payer: Self-pay | Admitting: Internal Medicine

## 2023-09-16 ENCOUNTER — Other Ambulatory Visit: Payer: Self-pay | Admitting: Internal Medicine

## 2023-09-16 MED ORDER — HYDROCODONE-ACETAMINOPHEN 10-325 MG PO TABS: 1.0000 | ORAL_TABLET | Freq: Two times a day (BID) | ORAL | 0 refills | Status: DC | PRN

## 2023-09-16 NOTE — Telephone Encounter (Signed)
 Copied from CRM [id number]. Topic: Clinical - Medication Refill >> Sep 16, 2023  8:06 AM Ernestene P wrote: Medication: HYDROcodone -acetaminophen  (NORCO) 10-325 MG tablet  Has the patient contacted their pharmacy? Yes (Agent: If no, request that the patient contact the pharmacy for the refill. If patient does not wish to contact the pharmacy document the reason why and proceed with request.) (Agent: If yes, when and what did the pharmacy advise?)  This is the patient's preferred pharmacy:  [facility] #[id number] [place], [place] - [address] AT [facility] OF [address] & [facility] [address] [place] [address] Phone: [phone] Fax: [phone]  Is this the correct pharmacy for this prescription? Yes If no, delete pharmacy and type the correct one.   Has the prescription been filled recently? No  Is the patient out of the medication? No  Has the patient been seen for an appointment in the last year OR does the patient have an upcoming appointment? Yes  Can we respond through MyChart? Yes  Agent: Please be advised that Rx refills may take up to 3 business days. We ask that you follow-up with your pharmacy.

## 2023-09-16 NOTE — Telephone Encounter (Signed)
 066/10/25 60 tabs/0 RF

## 2023-09-17 ENCOUNTER — Ambulatory Visit: Payer: Medicare Other

## 2023-09-17 VITALS — Ht 72.0 in | Wt 202.0 lb

## 2023-09-17 DIAGNOSIS — Z0000 Encounter for general adult medical examination without abnormal findings: Secondary | ICD-10-CM

## 2023-09-17 NOTE — Progress Notes (Addendum)
 Subjective:  Please attest and cosign this visit due to patients primary care provider not being in the office at the time the visit was completed.  (Pt of dr Almarie Cleveland)   Brandon Rubio. is a 73 y.o. who presents for a Medicare Wellness preventive visit.  As a reminder, Annual Wellness Visits don't include a physical exam, and some assessments may be limited, especially if this visit is performed virtually. We may recommend an in-person follow-up visit with your provider if needed.  Visit Complete: Virtual I connected with  Brandon Rubio. on 09/17/23 by a audio enabled telemedicine application and verified that I am speaking with the correct person using two identifiers.  Patient Location: Home  Provider Location: Office/Clinic  I discussed the limitations of evaluation and management by telemedicine. The patient expressed understanding and agreed to proceed.  Vital Signs: Because this visit was a virtual/telehealth visit, some criteria may be missing or patient reported. Any vitals not documented were not able to be obtained and vitals that have been documented are patient reported.  VideoDeclined- This patient declined Librarian, academic. Therefore the visit was completed with audio only.  Persons Participating in Visit: Patient.  AWV Questionnaire: No: Patient Medicare AWV questionnaire was not completed prior to this visit.  Cardiac Risk Factors include: advanced age (>34men, >58 women);dyslipidemia;diabetes mellitus;hypertension;male gender     Objective:    Today's Vitals   09/17/23 1344  Weight: 202 lb (91.6 kg)  Height: 6' (1.829 m)   Body mass index is 27.4 kg/m.     09/17/2023    1:44 PM 07/10/2023    5:00 PM 10/02/2022    3:43 PM 09/15/2022    2:14 PM 08/11/2022   11:59 AM 03/18/2022   10:36 AM 02/18/2022    9:24 AM  Advanced Directives  Does Patient Have a Medical Advance Directive? Yes No No No No No No  Type of  Estate agent of [place];Living will        Does patient want to make changes to medical advance directive?     No - Patient declined  No - Patient declined  Copy of Healthcare Power of Attorney in Chart? No - copy requested        Would patient like information on creating a medical advance directive?  No - Patient declined No - Patient declined No - Patient declined       Current Medications (verified) Outpatient Encounter Medications as of 09/17/2023  Medication Sig   albuterol  (VENTOLIN  HFA) 108 (90 Base) MCG/ACT inhaler Inhale 2 puffs into the lungs every 6 (six) hours as needed for wheezing or shortness of breath.   aspirin  EC 81 MG tablet Take 81 mg by mouth in the morning. Swallow whole.   dexamethasone  (DECADRON ) 0.75 MG tablet Take 1 tablet (0.75 mg total) by mouth daily.   Fluticasone-Umeclidin-Vilant (TRELEGY ELLIPTA ) 100-62.5-25 MCG/ACT AEPB Inhale 1 puff into the lungs daily.   HYDROcodone -acetaminophen  (NORCO) 10-325 MG tablet Take 1 tablet by mouth every 12 (twelve) hours as needed.   magnesium  oxide (MAG-OX) 400 (240 Mg) MG tablet Take 1 tablet (400 mg total) by mouth 2 (two) times daily.   metFORMIN  (GLUCOPHAGE ) 1000 MG tablet TAKE 1 TABLET(1000 MG) BY MOUTH TWICE DAILY WITH A MEAL   metoprolol  tartrate (LOPRESSOR ) 25 MG tablet Take 1 tablet (25 mg total) by mouth 2 (two) times daily.   Multiple Vitamins-Minerals (CENTRUM SILVER 50+MEN) TABS Take 1 tablet by mouth daily with  breakfast.   omeprazole  (PRILOSEC) 20 MG capsule TAKE 1 CAPSULE BY MOUTH TWICE DAILY BEFORE A MEAL   pantoprazole  (PROTONIX ) 40 MG tablet Take 1 tablet (40 mg total) by mouth daily.   simvastatin  (ZOCOR ) 40 MG tablet TAKE 1 TABLET BY MOUTH EVERY EVENING AT 6 PM   feeding supplement (ENSURE ENLIVE / ENSURE PLUS) LIQD Take 237 mLs by mouth 3 (three) times daily between meals. (Patient not taking: Reported on 09/17/2023)   lidocaine  (LIDODERM ) 5 % Place 1 patch onto the skin daily. Remove  & Discard patch within 12 hours or as directed by MD (Patient not taking: Reported on 09/17/2023)   nicotine  (NICODERM CQ  - DOSED IN MG/24 HOURS) 14 mg/24hr patch Place 1 patch (14 mg total) onto the skin daily. (Patient not taking: Reported on 09/17/2023)   No facility-administered encounter medications on file as of 09/17/2023.    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   Peripheral neuropathy    toes   Past Surgical History:  Procedure Laterality Date   BRONCHIAL BIOPSY  12/10/2020   Procedure: BRONCHIAL BIOPSIES;  Surgeon: Shelah Lamar RAMAN, MD;  Location: [facility] ENDOSCOPY;  Service: Pulmonary;;   BRONCHIAL BRUSHINGS  12/10/2020   Procedure: BRONCHIAL BRUSHINGS;  Surgeon: Shelah Lamar RAMAN, MD;  Location: [facility] ENDOSCOPY;  Service: Pulmonary;;   BRONCHIAL NEEDLE ASPIRATION BIOPSY  12/10/2020   Procedure: BRONCHIAL NEEDLE ASPIRATION BIOPSIES;  Surgeon: Shelah Lamar RAMAN, MD;  Location[facility] ENDOSCOPY;  Service: Pulmonary;;   COLONOSCOPY     COLONOSCOPY W/ POLYPECTOMY  03/10/2009   LARYNGOSCOPY  03/10/1997   VIDEO BRONCHOSCOPY WITH ENDOBRONCHIAL NAVIGATION N/A 12/10/2020   Procedure: ROBOTIC VIDEO BRONCHOSCOPY WITH ENDOBRONCHIAL NAVIGATION;  Surgeon: Shelah Lamar RAMAN, MD;  Location: [facility] ENDOSCOPY;  Service: Pulmonary;  Laterality: N/A;   Family History  Problem Relation Age of Onset   Lung cancer Mother        X 2   Colon polyps Father    Colon cancer Father 5   Coronary artery disease Father    Lung disease Father    Esophageal  cancer Neg Hx    Stomach cancer Neg Hx    Rectal cancer Neg Hx    Social History   Socioeconomic History   Marital status: Divorced    Spouse name: Not on file   Number of children: 0   Years of education: Not on file   Highest education level: Not on file  Occupational History   Occupation: Retired Personnel officer  Tobacco Use   Smoking status: Every Day    Current packs/day: 1.50    Average packs/day: 1.5 packs/day for 63.5 years (95.3 ttl pk-yrs)    Types: Cigarettes    Start date: 03/10/1960   Smokeless tobacco: Never   Tobacco comments:    2+ packs smoked daily ARJ 12/05/20  Vaping Use   Vaping status: Never Used  Substance and Sexual Activity   Alcohol use: No    Alcohol/week: 0.0 standard drinks of alcohol  Comment:  none since 04/2012   Drug use: Never   Sexual activity: Not Currently  Other Topics Concern   Not on file  Social History Narrative   REG EXERCISE         Social Drivers of Health   Financial Resource Strain: Low Risk  (09/17/2023)   Overall Financial Resource Strain (CARDIA)    Difficulty of Paying Living Expenses: Not hard at all  Food Insecurity: No Food Insecurity (09/17/2023)   Hunger Vital Sign    Worried About Running Out of Food in the Last Year: Never true    Ran Out of Food in the Last Year: Never true  Transportation Needs: No Transportation Needs (09/17/2023)   PRAPARE - Administrator, Civil Service (Medical): No    Lack of Transportation (Non-Medical): No  Physical Activity: Inactive (09/17/2023)   Exercise Vital Sign    Days of Exercise per Week: 0 days    Minutes of Exercise per Session: 0 min  Stress: No Stress Concern Present (09/17/2023)   [employer] of Occupational Health - Occupational Stress Questionnaire    Feeling of Stress: Only a little  Social Connections: Socially Isolated (09/17/2023)   Social Connection and Isolation Panel    Frequency of Communication with Friends and Family: More than three times a  week    Frequency of Social Gatherings with Friends and Family: More than three times a week    Attends Religious Services: Never    Database administrator or Organizations: No    Attends Banker Meetings: Never    Marital Status: Widowed    Tobacco Counseling Ready to quit: No Counseling given: Yes Tobacco comments: 2+ packs smoked daily ARJ 12/05/20    Clinical Intake:  Pre-visit preparation completed: Yes  Pain : No/denies pain     BMI - recorded: 27.4 Nutritional Status: BMI 25 -29 Overweight Nutritional Risks: None Diabetes: Yes CBG done?: No Did pt. bring in CBG monitor from home?: No  Lab Results  Component Value Date   HGBA1C 9.2 (H) 06/29/2023   HGBA1C 6.8 (A) 03/24/2022   HGBA1C 6.1 (H) 05/27/2021     How often do you need to have someone help you when you read instructions, pamphlets, or other written materials from your doctor or pharmacy?: 1 - Never  Interpreter Needed?: No  Information entered by :: Verdie Saba, CMA   Activities of Daily Living     09/17/2023    1:50 PM 07/10/2023    5:00 PM  In your present state of health, do you have any difficulty performing the following activities:  Hearing? 0 1  Vision? 0 0  Difficulty concentrating or making decisions? 0 1  Walking or climbing stairs? 0   Dressing or bathing? 0   Doing errands, shopping? 0 0  Preparing Food and eating ? N   Using the Toilet? N   In the past six months, have you accidently leaked urine? N   Do you have problems with loss of bowel control? N   Managing your Medications? N   Managing your Finances? N   Housekeeping or managing your Housekeeping? N     Patient Care Team: Rollene Almarie LABOR, MD as PCP - General (Internal Medicine) Jeffrie Oneil BROCKS, MD as PCP - Cardiology (Cardiology) Teressa Toribio SQUIBB, MD (Inactive) as Attending Physician (Gastroenterology) Szabat, Toribio BROCKS, [facility] (Inactive) as Pharmacist (Pharmacist) Sherrod Sherrod, MD as Consulting  Physician (Oncology) Trixie File, MD as Consulting Physician (Internal  Medicine) Cleatus Collar, MD as Consulting Physician (Ophthalmology)  I have updated your Care Teams any recent Medical Services you may have received from other providers in the past year.     Assessment:   This is a routine wellness examination for Brandon Rubio.  Hearing/Vision screen Hearing Screening - Comments:: Denies hearing difficulties   Vision Screening - Comments:: Wears rx glasses - up to date with routine eye exams with Dr Cleatus   Goals Addressed               This Visit's Progress     Patient Stated (pt-stated)        Patient stated has just been released from the hospital - recovering.       Depression Screen     09/17/2023    1:51 PM 07/13/2023    2:04 PM 09/15/2022    2:21 PM 08/25/2022   10:32 AM 08/11/2022   10:46 AM 02/17/2022    8:24 AM 08/01/2021    9:18 AM  PHQ 2/9 Scores  PHQ - 2 Score 0 0 0 0 0 0 0  PHQ- 9 Score 1  1   0 0    Fall Risk     09/17/2023    1:50 PM 07/13/2023    2:02 PM 10/31/2022    8:37 AM 09/15/2022    2:15 PM 08/25/2022   10:32 AM  Fall Risk   Falls in the past year? 0 0 0 0 0  Number falls in past yr: 0 0 0 0 0  Injury with Fall? 0 0 0 0 0  Risk for fall due to : No Fall Risks Impaired balance/gait;Impaired mobility  No Fall Risks   Follow up Falls evaluation completed;Falls prevention discussed  Falls evaluation completed Falls prevention discussed Falls evaluation completed    MEDICARE RISK AT HOME:  Medicare Risk at Home Any stairs in or around the home?: No If so, are there any without handrails?: No Home free of loose throw rugs in walkways, pet beds, electrical cords, etc?: Yes Adequate lighting in your home to reduce risk of falls?: Yes Life alert?: No Use of a cane, walker or w/c?: No Grab bars in the bathroom?: Yes Shower chair or bench in shower?: Yes Elevated toilet seat or a handicapped toilet?: No  TIMED UP AND GO:  Was the test  performed?  No  Cognitive Function: 6CIT completed        09/17/2023    1:53 PM 09/15/2022    2:15 PM 06/26/2021   11:54 AM  6CIT Screen  What Year? 0 points 0 points 0 points  What month? 0 points 0 points 0 points  What time? 0 points 0 points 0 points  Count back from 20 0 points 0 points 0 points  Months in reverse 0 points 0 points 0 points  Repeat phrase 0 points 4 points 0 points  Total Score 0 points 4 points 0 points    Immunizations Immunization History  Administered Date(s) Administered   Fluad Quad(high Dose 65+) 01/16/2021, 02/17/2022   Fluad Trivalent(High Dose 65+) 01/05/2023   Influenza, High Dose Seasonal PF 11/18/2017, 11/23/2018   Influenza,inj,Quad PF,6+ Mos 05/11/2015   Influenza-Unspecified 01/02/2013, 01/03/2014, 12/23/2018   PFIZER(Purple Top)SARS-COV-2 Vaccination 06/14/2019, 07/10/2019, 02/21/2020   Pneumococcal Conjugate-13 09/02/2017   Tdap 11/18/2017   Zoster, Live 01/03/2014    Screening Tests Health Maintenance  Topic Date Due   Zoster Vaccines- Shingrix (1 of 2) 04[birth year]12/1969   DEXA SCAN  Never done  Pneumococcal Vaccine: 50+ Years (2 of 2 - PPSV23, PCV20, or PCV21) 10/28/2017   COVID-19 Vaccine (4 - 2024-25 season) 11/09/2022   OPHTHALMOLOGY EXAM  05/12/2023   INFLUENZA VACCINE  10/09/2023   HEMOGLOBIN A1C  12/29/2023   FOOT EXAM  06/28/2024   Diabetic kidney evaluation - Urine ACR  06/29/2024   Diabetic kidney evaluation - eGFR measurement  07/21/2024   Medicare Annual Wellness (AWV)  09/16/2024   DTaP/Tdap/Td (2 - Td or Tdap) 11/19/2027   Colonoscopy  01/01/2028   Hepatitis C Screening  Completed   Hepatitis B Vaccines  Aged Out   HPV VACCINES  Aged Out   Meningococcal B Vaccine  Aged Out    Health Maintenance  Health Maintenance Due  Topic Date Due   Zoster Vaccines- Shingrix (1 of 2) 04[birth year]12/1967   DEXA SCAN  Never done   Pneumococcal Vaccine: 50+ Years (2 of 2 - PPSV23, PCV20, or PCV21) 10/28/2017   COVID-19 Vaccine (4 -  2024-25 season) 11/09/2022   OPHTHALMOLOGY EXAM  05/12/2023   Health Maintenance Items Addressed:  Current Smoker: Declines to Quit.    Patient stated Dr Gatha plans to order his DEXA/CT scans since follows.  Additional Screening:  Vision Screening: Recommended annual ophthalmology exams for early detection of glaucoma and other disorders of the eye. Would you like a referral to an eye doctor? No    Dental Screening: Recommended annual dental exams for proper oral hygiene  Community Resource Referral / Chronic Care Management: CRR required this visit?  No   CCM required this visit?  No   Plan:    I have personally reviewed and noted the following in the patient's chart:   Medical and social history Use of alcohol, tobacco or illicit drugs  Current medications and supplements including opioid prescriptions. Patient is not currently taking opioid prescriptions. Functional ability and status Nutritional status Physical activity Advanced directives List of other physicians Hospitalizations, surgeries, and ER visits in previous 12 months Vitals Screenings to include cognitive, depression, and falls Referrals and appointments  In addition, I have reviewed and discussed with patient certain preventive protocols, quality metrics, and best practice recommendations. A written personalized care plan for preventive services as well as general preventive health recommendations were provided to patient.   Verdie CHRISTELLA Saba, CMA   09/17/2023   After Visit Summary: (MyChart) Due to this being a telephonic visit, the after visit summary with patients personalized plan was offered to patient via MyChart   Notes: Patient stated Dr Gatha plans to order his DEXA/CT scans since follows.

## 2023-09-17 NOTE — Patient Instructions (Signed)
 [name] , Thank you for taking time out of your busy schedule to complete your Annual Wellness Visit with me. I enjoyed our conversation and look forward to speaking with you again next year. I, as well as your care team,  appreciate your ongoing commitment to your health goals. Please review the following plan we discussed and let me know if I can assist you in the future. Your Game plan/ To Do List    Follow up Visits: Next Medicare AWV with our clinical staff: 09/19/2024   Have you seen your provider in the last 6 months (3 months if uncontrolled diabetes)? No Next Office Visit with your provider: to be scheduled by the patient  Clinician Recommendations:  Aim for 30 minutes of exercise or brisk walking, 6-8 glasses of water, and 5 servings of fruits and vegetables each day. Educated and advised on getting the Shingles and Pneumonia vaccines in 2025.        This is a list of the screening recommended for you and due dates:  Health Maintenance  Topic Date Due   Zoster (Shingles) Vaccine (1 of 2) 04/12/1969   DEXA scan (bone density measurement)  Never done   Pneumococcal Vaccine for age over 8 (2 of 2 - PPSV23, PCV20, or PCV21) 10/28/2017   COVID-19 Vaccine (4 - 2024-25 season) 11/09/2022   Eye exam for diabetics  05/12/2023   Flu Shot  10/09/2023   Hemoglobin A1C  12/29/2023   Complete foot exam   06/28/2024   Yearly kidney health urinalysis for diabetes  06/29/2024   Yearly kidney function blood test for diabetes  07/21/2024   Medicare Annual Wellness Visit  09/16/2024   DTaP/Tdap/Td vaccine (2 - Td or Tdap) 11/19/2027   Colon Cancer Screening  01/01/2028   Hepatitis C Screening  Completed   Hepatitis B Vaccine  Aged Out   HPV Vaccine  Aged Out   Meningitis B Vaccine  Aged Out    Advanced directives: (Copy Requested) Please bring a copy of your health care power of attorney and living will to the office to be added to your chart at your convenience. You can mail to [facility] [address] [place], [place] [address] or email to [email or web address] Advance Care Planning is important because it:  [x]  Makes sure you receive the medical care that is consistent with your values, goals, and preferences  [x]  It provides guidance to your family and loved ones and reduces their decisional burden about whether or not they are making the right decisions based on your wishes.  Follow the link provided in your after visit summary or read over the paperwork we have mailed to you to help you started getting your Advance Directives in place. If you need assistance in completing these, please reach out to us  so that we can help you!   Managing Pain Without Opioids Opioids are strong medicines used to treat moderate to severe pain. For some people, especially those who have long-term (chronic) pain, opioids may not be the best choice for pain management due to: Side effects like nausea, constipation, and sleepiness. The risk of addiction (opioid use disorder). The longer you take opioids, the greater your risk of addiction. Pain that lasts for more than 3 months is called chronic pain. Managing chronic pain usually requires more than one approach and is often provided by a team of health care providers working together (multidisciplinary approach). Pain management may be done at a pain management  center or pain clinic. How to manage pain without the use of opioids Use non-opioid medicines Non-opioid medicines for pain may include: Over-the-counter or prescription non-steroidal anti-inflammatory drugs (NSAIDs). These may be the first medicines used for pain. They work well for muscle and bone pain, and they reduce swelling. Acetaminophen . This over-the-counter medicine may work well for milder pain but not swelling. Antidepressants. These may be used to treat chronic pain. A certain type of antidepressant (tricyclics) is often used. These medicines are  given in lower doses for pain than when used for depression. Anticonvulsants. These are usually used to treat seizures but may also reduce nerve (neuropathic) pain. Muscle relaxants. These relieve pain caused by sudden muscle tightening (spasms). You may also use a pain medicine that is applied to the skin as a patch, cream, or gel (topical analgesic), such as a numbing medicine. These may cause fewer side effects than medicines taken by mouth. Do certain therapies as directed Some therapies can help with pain management. They include: Physical therapy. You will do exercises to gain strength and flexibility. A physical therapist may teach you exercises to move and stretch parts of your body that are weak, stiff, or painful. You can learn these exercises at physical therapy visits and practice them at home. Physical therapy may also involve: Massage. Heat wraps or applying heat or cold to affected areas. Electrical signals that interrupt pain signals (transcutaneous electrical nerve stimulation, TENS). Weak lasers that reduce pain and swelling (low-level laser therapy). Signals from your body that help you learn to regulate pain (biofeedback). Occupational therapy. This helps you to learn ways to function at home and work with less pain. Recreational therapy. This involves trying new activities or hobbies, such as a physical activity or drawing. Mental health therapy, including: Cognitive behavioral therapy (CBT). This helps you learn coping skills for dealing with pain. Acceptance and commitment therapy (ACT) to change the way you think and react to pain. Relaxation therapies, including muscle relaxation exercises and mindfulness-based stress reduction. Pain management counseling. This may be individual, family, or group counseling.  Receive medical treatments Medical treatments for pain management include: Nerve block injections. These may include a pain blocker and anti-inflammatory  medicines. You may have injections: Near the spine to relieve chronic back or neck pain. Into joints to relieve back or joint pain. Into nerve areas that supply a painful area to relieve body pain. Into muscles (trigger point injections) to relieve some painful muscle conditions. A medical device placed near your spine to help block pain signals and relieve nerve pain or chronic back pain (spinal cord stimulation device). Acupuncture. Follow these instructions at home Medicines Take over-the-counter and prescription medicines only as told by your health care provider. If you are taking pain medicine, ask your health care providers about possible side effects to watch out for. Do not drive or use heavy machinery while taking prescription opioid pain medicine. Lifestyle  Do not use drugs or alcohol to reduce pain. If you drink alcohol, limit how much you have to: 0-1 drink a day for women who are not pregnant. 0-2 drinks a day for men. Know how much alcohol is in a drink. In the [place] one drink equals one 12 oz bottle of beer (355 mL), one 5 oz glass of wine (148 mL), or one 1 oz glass of hard liquor (44 mL). Do not use any products that contain nicotine  or tobacco. These products include cigarettes, chewing tobacco, and vaping devices, such as e-cigarettes. If you  need help quitting, ask your health care provider. Eat a healthy diet and maintain a healthy weight. Poor diet and excess weight may make pain worse. Eat foods that are high in fiber. These include fresh fruits and vegetables, whole grains, and beans. Limit foods that are high in fat and processed sugars, such as fried and sweet foods. Exercise regularly. Exercise lowers stress and may help relieve pain. Ask your health care provider what activities and exercises are safe for you. If your health care provider approves, join an exercise class that combines movement and stress reduction. Examples include yoga and tai chi. Get enough  sleep. Lack of sleep may make pain worse. Lower stress as much as possible. Practice stress reduction techniques as told by your therapist. General instructions Work with all your pain management providers to find the treatments that work best for you. You are an important member of your pain management team. There are many things you can do to reduce pain on your own. Consider joining an online or in-person support group for people who have chronic pain. Keep all follow-up visits. This is important. Where to find more information You can find more information about managing pain without opioids from: American Academy of Pain Medicine: painmed.org Institute for Chronic Pain: instituteforchronicpain.org American Chronic Pain Association: theacpa.org Contact a health care provider if: You have side effects from pain medicine. Your pain gets worse or does not get better with treatments or home therapy. You are struggling with anxiety or depression. Summary Many types of pain can be managed without opioids. Chronic pain may respond better to pain management without opioids. Pain is best managed when you and a team of health care providers work together. Pain management without opioids may include non-opioid medicines, medical treatments, physical therapy, mental health therapy, and lifestyle changes. Tell your health care providers if your pain gets worse or is not being managed well enough. This information is not intended to replace advice given to you by your health care provider. Make sure you discuss any questions you have with your health care provider. Document Revised: 06/06/2020 Document Reviewed: 06/06/2020 Elsevier Patient Education  2024 [employer].

## 2023-09-18 ENCOUNTER — Other Ambulatory Visit: Payer: Self-pay

## 2023-09-21 ENCOUNTER — Other Ambulatory Visit: Payer: Self-pay | Admitting: Internal Medicine

## 2023-10-16 ENCOUNTER — Telehealth: Payer: Self-pay | Admitting: Internal Medicine

## 2023-10-16 NOTE — Telephone Encounter (Unsigned)
 Copied from CRM [id number]. Topic: Clinical - Medication Refill >> Oct 16, 2023  9:41 AM Chasity T wrote: Medication: HYDROcodone -acetaminophen  (NORCO) 10-325 MG tablet   Has the patient contacted their pharmacy? No   This is the patient's preferred pharmacy:  [id number] [place], [place] - [address] AT [facility] OF [address] & [facility] [address] [place] [address] Phone: [phone] Fax: [phone]  Is this the correct pharmacy for this prescription? Yes If no, delete pharmacy and type the correct one.   Has the prescription been filled recently? No  Is the patient out of the medication? No  Has the patient been seen for an appointment in the last year OR does the patient have an upcoming appointment? Yes  Can we respond through MyChart? Yes  Agent: Please be advised that Rx refills may take up to 3 business days. We ask that you follow-up with your pharmacy.

## 2023-10-19 ENCOUNTER — Other Ambulatory Visit: Payer: Self-pay | Admitting: Internal Medicine

## 2023-10-19 MED ORDER — HYDROCODONE-ACETAMINOPHEN 10-325 MG PO TABS
1.0000 | ORAL_TABLET | Freq: Two times a day (BID) | ORAL | 0 refills | Status: DC | PRN
Start: 2023-10-19 — End: 2023-11-19

## 2023-10-19 NOTE — Telephone Encounter (Signed)
 LVV: 07/31/23 LOV: 10/31/22 Last fill: 09/16/23, 60 tablet 0 refill

## 2023-10-19 NOTE — Telephone Encounter (Signed)
 Copied from CRM #[id number]. Topic: Clinical - Medication Refill >> Oct 19, 2023 12:46 PM Leah C wrote: Medication: HYDROcodone -acetaminophen  (NORCO) 10-325 MG tablet  Has the patient contacted their pharmacy? Yes and the pharmacy stated they did not receive our script from Friday.   This is the patient's preferred pharmacy:  [id number] [place], [place] - [address] AT [facility] [address] & [facility] [address]  [place] [address] Phone: [phone] Fax: [phone]  Is this the correct pharmacy for this prescription? Yes If no, delete pharmacy and type the correct one.   Has the prescription been filled recently? Yes  Is the patient out of the medication? Yes  Has the patient been seen for an appointment in the last year OR does the patient have an upcoming appointment? Yes  Can we respond through MyChart? No. Phone call.   Agent: Please be advised that Rx refills may take up to 3 business days. We ask that you follow-up with your pharmacy.

## 2023-10-21 ENCOUNTER — Ambulatory Visit (HOSPITAL_COMMUNITY)
Admission: RE | Admit: 2023-10-21 | Discharge: 2023-10-21 | Disposition: A | Discharge status: Home / Self Care | Source: Ambulatory Visit | Attending: Internal Medicine | Admitting: Internal Medicine

## 2023-10-21 ENCOUNTER — Inpatient Hospital Stay: Attending: Internal Medicine

## 2023-10-21 ENCOUNTER — Other Ambulatory Visit: Payer: Self-pay | Admitting: Internal Medicine

## 2023-10-21 DIAGNOSIS — Z923 Personal history of irradiation: Secondary | ICD-10-CM | POA: Insufficient documentation

## 2023-10-21 DIAGNOSIS — C3490 Malignant neoplasm of unspecified part of unspecified bronchus or lung: Secondary | ICD-10-CM | POA: Insufficient documentation

## 2023-10-21 DIAGNOSIS — C3431 Malignant neoplasm of lower lobe, right bronchus or lung: Secondary | ICD-10-CM | POA: Insufficient documentation

## 2023-10-21 DIAGNOSIS — J984 Other disorders of lung: Secondary | ICD-10-CM | POA: Diagnosis not present

## 2023-10-21 DIAGNOSIS — D649 Anemia, unspecified: Secondary | ICD-10-CM | POA: Diagnosis not present

## 2023-10-21 DIAGNOSIS — C3491 Malignant neoplasm of unspecified part of right bronchus or lung: Secondary | ICD-10-CM

## 2023-10-21 DIAGNOSIS — Z86718 Personal history of other venous thrombosis and embolism: Secondary | ICD-10-CM | POA: Diagnosis not present

## 2023-10-21 DIAGNOSIS — Z9226 Personal history of immune checkpoint inhibitor therapy: Secondary | ICD-10-CM | POA: Insufficient documentation

## 2023-10-21 LAB — CMP (CANCER CENTER ONLY)
ALT: 12 U/L (ref 0–44)
AST: 11 U/L — ABNORMAL LOW (ref 15–41)
Albumin: 3.9 g/dL (ref 3.5–5.0)
Alkaline Phosphatase: 58 U/L (ref 38–126)
Anion gap: 9 (ref 5–15)
BUN: 16 mg/dL (ref 8–23)
CO2: 25 mmol/L (ref 22–32)
Calcium: 8.8 mg/dL — ABNORMAL LOW (ref 8.9–10.3)
Chloride: 102 mmol/L (ref 98–111)
Creatinine: 1.13 mg/dL (ref 0.61–1.24)
GFR, Estimated: >60 mL/min (ref >60)
Glucose, Bld: 316 mg/dL — ABNORMAL HIGH (ref 70–99)
Potassium: 4 mmol/L (ref 3.5–5.1)
Sodium: 136 mmol/L (ref 135–145)
Total Bilirubin: 0.3 mg/dL (ref 0.0–1.2)
Total Protein: 6.8 g/dL (ref 6.5–8.1)

## 2023-10-21 LAB — CBC WITH DIFFERENTIAL (CANCER CENTER ONLY)
Abs Immature Granulocytes: 0.04 K/uL (ref 0.00–0.07)
Basophils Absolute: 0.1 K/uL (ref 0.0–0.1)
Basophils Relative: 1 %
Eosinophils Absolute: 0.1 K/uL (ref 0.0–0.5)
Eosinophils Relative: 1 %
HCT: 35.7 % — ABNORMAL LOW (ref 39.0–52.0)
Hemoglobin: 12 g/dL — ABNORMAL LOW (ref 13.0–17.0)
Immature Granulocytes: 1 %
Lymphocytes Relative: 15 %
Lymphs Abs: 1.3 K/uL (ref 0.7–4.0)
MCH: 30.6 pg (ref 26.0–34.0)
MCHC: 33.6 g/dL (ref 30.0–36.0)
MCV: 91.1 fL (ref 80.0–100.0)
Monocytes Absolute: 0.6 K/uL (ref 0.1–1.0)
Monocytes Relative: 7 %
Neutro Abs: 6.5 K/uL (ref 1.7–7.7)
Neutrophils Relative %: 75 %
Platelet Count: 144 K/uL — ABNORMAL LOW (ref 150–400)
RBC: 3.92 MIL/uL — ABNORMAL LOW (ref 4.22–5.81)
RDW: 14.3 % (ref 11.5–15.5)
WBC Count: 8.5 K/uL (ref 4.0–10.5)
nRBC: 0 % (ref 0.0–0.2)

## 2023-10-21 MED ORDER — SODIUM CHLORIDE (PF) 0.9 % IJ SOLN
INTRAMUSCULAR | Status: AC
  Filled 2023-10-21: qty 50

## 2023-10-21 MED ORDER — IOHEXOL 300 MG/ML  SOLN
100.0000 mL | Freq: Once | INTRAMUSCULAR | Status: AC | PRN
  Administered 2023-10-21 (×2): 100 mL via INTRAVENOUS

## 2023-10-28 ENCOUNTER — Inpatient Hospital Stay (HOSPITAL_BASED_OUTPATIENT_CLINIC_OR_DEPARTMENT_OTHER): Admitting: Internal Medicine

## 2023-10-28 VITALS — BP 118/79 | HR 80 | Temp 97.6°F | Resp 18 | Ht 72.0 in | Wt 210.0 lb

## 2023-10-28 DIAGNOSIS — C3490 Malignant neoplasm of unspecified part of unspecified bronchus or lung: Secondary | ICD-10-CM | POA: Diagnosis not present

## 2023-10-28 DIAGNOSIS — Z923 Personal history of irradiation: Secondary | ICD-10-CM | POA: Diagnosis not present

## 2023-10-28 DIAGNOSIS — D649 Anemia, unspecified: Secondary | ICD-10-CM | POA: Diagnosis not present

## 2023-10-28 DIAGNOSIS — C3431 Malignant neoplasm of lower lobe, right bronchus or lung: Secondary | ICD-10-CM | POA: Diagnosis not present

## 2023-10-28 DIAGNOSIS — Z86718 Personal history of other venous thrombosis and embolism: Secondary | ICD-10-CM | POA: Diagnosis not present

## 2023-10-28 DIAGNOSIS — Z9226 Personal history of immune checkpoint inhibitor therapy: Secondary | ICD-10-CM | POA: Diagnosis not present

## 2023-10-28 NOTE — Progress Notes (Signed)
 [facility] [phone]   [phone]  OFFICE PROGRESS NOTE  Rollene Almarie LABOR, MD [address] [place] [address]  DIAGNOSIS: Metastatic non-small cell lung cancer initially diagnosed as stage IIIA (T4, N0, M0) non-small cell lung cancer, favoring adenocarcinoma presented with large right lower lobe lung mass with no mediastinal lymphadenopathy or extrathoracic metastasis diagnosed in October 2022. Had disease progression with enlarging mass and new right hilar and mediastinal lymph nodes in December 2022.      PRIOR THERAPY:  1) Neoadjuvant treatment with carboplatin  for AUC of 5, Alimta  500 Mg/M2 and nivolumab  360 Mg IV every 3 weeks.  First dose January 23, 2021.  Status post 3 cycles. 2) Concurrent chemoradiation with carboplatin  for an AUC of 2 and paclitaxel  45 mg per metered squared.  First dose expected on 04/01/2021.  Status post 4 cycles 3) Consolidation treatment with immunotherapy with Imfinzi  1500 Mg IV every 4 weeks.  First dose on Jul 08, 2021.  Status post 9 cycles.  4) Palliative systemic chemotherapy with carboplatin  for an AUC of 5, Alimta  500 mg/m, Keytruda  200 mg IV every 3 weeks. First dose on 05/13/2022.  Status post 12 cycles.  Starting him cycle #3 his dose of carboplatin  will be reduced to AUC of 4 and Alimta  400 Mg/M2 secondary to intolerance.  Last dose was given January 26, 2023 discontinued secondary to disease progression. 5) Second line systemic chemotherapy with docetaxel  65 Mg/M2 and ramucirumab  10 Mg/KG every 3 weeks with Neulasta  support.  First dose April 08, 2023.  Status post 5  cycles.  His treatment is currently on hold secondary to toxicity and recent hospitalization.   CURRENT THERAPY: Observation.  INTERVAL HISTORY: Brandon Rubio. 73 y.o. adult returns to the clinic today for follow-up visit. Discussed the use of AI scribe software for clinical note transcription with the patient, who gave  verbal consent to proceed.  History of Present Illness Brandon Rubio. is a 73 year old male with metastatic non-small cell lung cancer who presents for evaluation with a repeat CT scan of the chest.  He has a history of metastatic non-small cell lung cancer, adenocarcinoma, initially diagnosed as stage IIIA in October 2022, with disease progression noted in December 2022. He underwent several treatments, most recently receiving Cytomel and ramucirumab  every three weeks, which was discontinued after five cycles due to side effects. He has been on observation since April 2025.  In the past two and a half months since discontinuing treatment, he feels 'lazy' and lacks energy, particularly since his last hospitalization. Despite this, he has gained eight pounds, which he attributes to increased consumption of cakes and candy.  His breathing is 'pretty good' with no significant worsening. No hemoptysis, chest pain, nausea, vomiting, diarrhea, or headaches.  He does not currently see a pulmonologist but has seen an endocrinologist in the past, though not recently.    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  MEDICATIONS:  Current Outpatient Medications  Medication Sig Dispense Refill   albuterol  (VENTOLIN  HFA) 108 (90 Base) MCG/ACT inhaler Inhale 2 puffs into the lungs every 6 (six) hours as needed for wheezing or shortness of breath. 8 g 2   aspirin  EC 81 MG tablet Take 81 mg  by mouth in the morning. Swallow whole.     dexamethasone  (DECADRON ) 0.75 MG tablet Take 1 tablet (0.75 mg total) by mouth daily. 100 tablet 1   feeding supplement (ENSURE ENLIVE / ENSURE PLUS) LIQD Take 237 mLs by mouth 3 (three) times daily between meals. (Patient not taking: Reported on 09/17/2023) 237 mL 12   Fluticasone-Umeclidin-Vilant (TRELEGY ELLIPTA ) 100-62.5-25 MCG/ACT AEPB Inhale 1 puff into the lungs daily. 1 each 11   HYDROcodone -acetaminophen  (NORCO) 10-325 MG tablet Take 1 tablet by mouth every 12 (twelve) hours as needed. 60 tablet 0   lidocaine  (LIDODERM ) 5 % Place 1 patch onto the skin daily. Remove & Discard patch within 12 hours or as directed by MD (Patient not taking: Reported on 09/17/2023) 30 patch 0   magnesium  oxide (MAG-OX) 400 (240 Mg) MG tablet Take 1 tablet (400 mg total) by mouth 2 (two) times daily. 180 tablet 3   metFORMIN  (GLUCOPHAGE ) 1000 MG tablet TAKE 1 TABLET(1000 MG) BY MOUTH TWICE DAILY WITH A MEAL 180 tablet 0   metoprolol  tartrate (LOPRESSOR ) 25 MG tablet Take 1 tablet (25 mg total) by mouth 2 (two) times daily. 180 tablet 2   Multiple Vitamins-Minerals (CENTRUM SILVER 50+MEN) TABS Take 1 tablet by mouth daily with breakfast.     nicotine  (NICODERM CQ  - DOSED IN MG/24 HOURS) 14 mg/24hr patch Place 1 patch (14 mg total) onto the skin daily. (Patient not taking: Reported on 09/17/2023) 28 patch 0   omeprazole  (PRILOSEC) 20 MG capsule TAKE 1 CAPSULE BY MOUTH TWICE DAILY BEFORE A MEAL 180 capsule 1   pantoprazole  (PROTONIX ) 40 MG tablet Take 1 tablet (40 mg total) by mouth daily. 90 tablet 3   simvastatin  (ZOCOR ) 40 MG tablet TAKE 1 TABLET BY MOUTH EVERY EVENING AT 6 PM 90 tablet 0   No current facility-administered medications for this visit.    SURGICAL HISTORY:  Past Surgical History:  Procedure Laterality Date   BRONCHIAL BIOPSY  12/10/2020   Procedure: BRONCHIAL BIOPSIES;  Surgeon: Shelah Lamar RAMAN, MD;  Location: [facility] ENDOSCOPY;  Service: Pulmonary;;    BRONCHIAL BRUSHINGS  12/10/2020   Procedure: BRONCHIAL BRUSHINGS;  Surgeon: Shelah Lamar RAMAN, MD;  Location: [facility] ENDOSCOPY;  Service: Pulmonary;;   BRONCHIAL NEEDLE ASPIRATION BIOPSY  12/10/2020   Procedure: BRONCHIAL NEEDLE ASPIRATION BIOPSIES;  Surgeon: Shelah Lamar RAMAN, MD;  Location: [facility] ENDOSCOPY;  Service: Pulmonary;;   COLONOSCOPY     COLONOSCOPY W/ POLYPECTOMY  03/10/2009   LARYNGOSCOPY  03/10/1997   VIDEO BRONCHOSCOPY WITH ENDOBRONCHIAL NAVIGATION N/A 12/10/2020   Procedure: ROBOTIC VIDEO BRONCHOSCOPY WITH ENDOBRONCHIAL NAVIGATION;  Surgeon: Shelah Lamar RAMAN, MD;  Location: [facility] ENDOSCOPY;  Service: Pulmonary;  Laterality: N/A;    REVIEW OF SYSTEMS:  Constitutional: positive for fatigue Eyes: negative Ears, nose, mouth, throat, and face: negative Respiratory: positive for dyspnea on exertion Cardiovascular: negative Gastrointestinal: negative Genitourinary:negative Integument/breast: negative Hematologic/lymphatic: negative Musculoskeletal:negative Neurological: negative Behavioral/Psych: negative Endocrine: negative Allergic/Immunologic: negative   PHYSICAL EXAMINATION: General appearance: alert, cooperative, fatigued, and no distress Head: Normocephalic, without obvious abnormality, atraumatic Neck: no adenopathy, no JVD, supple, symmetrical, trachea midline, and thyroid  not enlarged, symmetric, no  tenderness/mass/nodules Lymph nodes: Cervical, supraclavicular, and axillary nodes normal. Resp: clear to auscultation bilaterally Back: symmetric, no curvature. ROM normal. No CVA tenderness. Cardio: regular rate and rhythm, S1, S2 normal, no murmur, click, rub or gallop GI: soft, non-tender; bowel sounds normal; no masses,  no organomegaly Extremities: extremities normal, atraumatic, no cyanosis or edema Neurologic: Alert and oriented X 3, normal strength and tone. Normal symmetric reflexes. Normal coordination and gait  ECOG PERFORMANCE STATUS: 1 - Symptomatic but completely  ambulatory  Blood pressure 118/79, pulse 80, temperature 97.6 F (36.4 C), temperature source Temporal, resp. rate 18, height 6' (1.829 m), weight 210 lb (95.3 kg), SpO2 96%.  LABORATORY DATA: Lab Results  Component Value Date   WBC 8.5 10/21/2023   HGB 12.0 (L) 10/21/2023   HCT 35.7 (L) 10/21/2023   MCV 91.1 10/21/2023   PLT 144 (L) 10/21/2023      Chemistry      Component Value Date/Time   NA 136 10/21/2023 1035   NA 137 10/24/2022 1540   K 4.0 10/21/2023 1035   CL 102 10/21/2023 1035   CO2 25 10/21/2023 1035   BUN 16 10/21/2023 1035   BUN 9 10/24/2022 1540   CREATININE 1.13 10/21/2023 1035      Component Value Date/Time   CALCIUM  8.8 (L) 10/21/2023 1035   ALKPHOS 58 10/21/2023 1035   AST 11 (L) 10/21/2023 1035   ALT 12 10/21/2023 1035   BILITOT 0.3 10/21/2023 1035       RADIOGRAPHIC STUDIES: CT CHEST ABDOMEN PELVIS W CONTRAST Result Date: 10/27/2023 CLINICAL DATA:  Non-small cell lung cancer.  * Tracking Code: BO * EXAM: CT CHEST, ABDOMEN, AND PELVIS WITH CONTRAST TECHNIQUE: Multidetector CT imaging of the chest, abdomen and pelvis was performed following the standard protocol during bolus administration of intravenous contrast. RADIATION DOSE REDUCTION: This exam was performed according to the departmental dose-optimization program which includes automated exposure control, adjustment of the mA and/or kV according to patient size and/or use of iterative reconstruction technique. CONTRAST:  OMNIPAQUE  IOHEXOL  300 MG/ML  SOLN COMPARISON:  CT 07/06/2023 FINDINGS: CT CHEST FINDINGS Cardiovascular: No significant vascular findings. Normal heart size. No pericardial effusion. Mediastinum/Nodes: No axillary or supraclavicular adenopathy. No mediastinal or hilar adenopathy. No pericardial fluid. Esophagus normal. Lungs/Pleura: Consolidation in the medial RIGHT lower lobe with central calcification air bronchograms has contracted in the interval measuring 5.0 cm in diameter  compared to 6.0 cm. Slight decrease in associated RIGHT pleural effusion Large ground-glass nodule/mass in the LEFT lower lobe measures 6.4 by 5.7 cm compared to 5.0 x 4.9 cm (image 81). Solid nodule in the LEFT lower lobe measuring 9 mm on image 66 is increased in density. Multiple LEFT upper lobe ground-glass nodules again noted. Nodules are increased in density. For example 2.7 cm peribronchial nodule in the LEFT upper lobe on image 73 increased from 2.1 cm. Findings are suggestive of lepidic growth the LEFT upper lobe and LEFT lower lobe lower lobe nodules. Musculoskeletal: No aggressive osseous lesion. CT ABDOMEN AND PELVIS FINDINGS Hepatobiliary: No focal hepatic lesion. No biliary ductal dilatation. Gallbladder is normal. Common bile duct is normal. Pancreas: Pancreas is normal. No ductal dilatation. No pancreatic inflammation. Spleen: Normal spleen Adrenals/urinary tract: Adrenal glands and kidneys are normal. The ureters and bladder normal. Stomach/Bowel: Stomach, small bowel, appendix, and cecum are normal. The colon and rectosigmoid colon are normal. Vascular/Lymphatic: Abdominal aorta is normal caliber. There is no retroperitoneal or periportal lymphadenopathy. No pelvic lymphadenopathy. Reproductive: Unremarkable Other: No free  fluid. Musculoskeletal: No aggressive osseous lesion. IMPRESSION: CHEST: 1. Interval increase in size of ground-glass nodule/mass in the LEFT lower lobe. 2. Interval increase in density of LEFT upper lobe ground-glass nodules. 3. Findings suggestive of lepidic growth of adenocarcinomas in the LEFT lung. 4. Interval contraction of consolidation in the medial RIGHT lower lobe mass consistent post treatment. 5. No mediastinal adenopathy. PELVIS: 1. No evidence of metastatic disease in the abdomen pelvis. 2. No skeletal metastasis. Electronically Signed   By: Jackquline Boxer M.D.   On: 10/27/2023 15:19     ASSESSMENT AND PLAN: This is a very pleasant 73 years old white male with  history of a stage IIIA (T4, N1, M0) non-small cell lung cancer, adenocarcinoma presented with right lower lobe lung mass with a right hilar and no mediastinal lymphadenopathy and no evidence of extrathoracic disease.  The patient was diagnosed in October 2022 status post 3 cycles of neoadjuvant systemic chemotherapy with carboplatin , Alimta  and nivolumab  but unfortunately has no evidence for significant improvement of his disease and actually has some evidence for progression. The patient underwent a course of concurrent chemoradiation weekly carboplatin  for AUC of 2 and paclitaxel  45 Mg/M2.  Status post 4 cycles. The patient had a rough time with the course of concurrent chemoradiation and he was hospitalized several times during this course. Imaging studies after the course of concurrent chemoradiation showed no concerning findings for disease progression. The patient underwent treatment with immunotherapy with Imfinzi  1500 Mg IV every 4 weeks status post 9 cycles.  This treatment was discontinued secondary to disease progression. The patient is currently on systemic chemotherapy with carboplatin , Alimta  and Keytruda  status post 12 cycles.  Starting from cycle #5 he has been on treatment with maintenance Alimta  and Keytruda  every 3 weeks.  The patient has been tolerating this treatment fairly well.  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  Repeat CT scan of the chest showed the diffuse opacity in the lungs bilaterally more on the left suspicious for disease progression rather than immunotherapy mediated pneumonitis. The patient had a PET scan performed recently and it showed multifocal adenocarcinoma progressive from comparison examination. He is currently on systemic chemotherapy with reduced dose docetaxel  65 Mg/M2 and ramucirumab  10 Mg/KG every 3 weeks status post 5 cycles. The  Neulasta  injection was discontinued secondary to intolerance.  His treatment is currently on hold secondary to toxicity and recent hospitalization. He is currently on observation.  He had repeat CT scan of the chest, abdomen and pelvis performed recently.  I personally and independently reviewed the scan and discussed the result with the patient today.  His scan showed increased size of the groundglass nodule/mass in the left lower lobe as well as the left upper lobe concerning for lipidic growth of adenocarcinoma on the left lung.  No other evidence of metastatic disease. Assessment and Plan Assessment & Plan Metastatic non-small cell lung cancer, adenocarcinoma Initially diagnosed as stage IIIA in October 2022 with disease progression in December 2022. Currently, there is a slight increase in density on the left side per recent CT scan. No significant changes in symptoms such as hemoptysis, chest pain, or nausea. Breathing is slightly worse but not significantly. - Refer to pulmonologist for biopsy of the left side to assess for cancer or inflammation. - Schedule follow-up appointment in three months with repeat CT scan unless biopsy results necessitate earlier intervention.  Hyperglycemia Blood  sugar over 300, likely due to increased intake of cakes and candy. No symptoms of nausea, vomiting, or diarrhea associated with hyperglycemia. - Advise reduction in intake of sugary foods to manage blood sugar levels.  Anemia Improving hemoglobin levels. Patient reports feeling lazy and having less energy since getting out of the hospital, indicating some fatigue or weakness. The patient was advised to call immediately if he has any concerning symptoms in the interval.  The patient voices understanding of current disease status and treatment options and is in agreement with the current care plan.  All questions were answered. The patient knows to call the clinic with any problems, questions or  concerns. We can certainly see the patient much sooner if necessary.   Disclaimer: This note was dictated with voice recognition software. Similar sounding words can inadvertently be transcribed and may not be corrected upon review.

## 2023-10-29 ENCOUNTER — Telehealth: Payer: Self-pay | Admitting: Internal Medicine

## 2023-10-29 NOTE — Telephone Encounter (Signed)
 Scheduled appointments with the patient per lOS notes.

## 2023-11-16 NOTE — Telephone Encounter (Unsigned)
 Copied from CRM [id number]. Topic: Clinical - Medication Question >> Nov 16, 2023  9:00 AM Gustabo D wrote: Pt needs this prescription renewed - HYDROcodone -acetaminophen  (NORCO) 10-325 MG tablet

## 2023-11-18 ENCOUNTER — Telehealth: Payer: Self-pay | Admitting: Radiology

## 2023-11-18 NOTE — Telephone Encounter (Signed)
 Called patient and made him aware that provider is not in office this afternoon nor tomorrow and may have to wait til Friday to  have this address he states that he has 5-6 pills left. Patient does have an upcoming appointment on Monday

## 2023-11-18 NOTE — Telephone Encounter (Signed)
 Copied from CRM [id number]. Topic: Clinical - Medication Question >> Nov 16, 2023  9:00 AM Gustabo D wrote: Pt needs this prescription renewed - HYDROcodone -acetaminophen  (NORCO) 10-325 MG tablet >> Nov 18, 2023 12:08 PM Franky GRADE wrote: Patient is calling to follow up on the request, he followed up with the pharmacy and they have not received the prescription.

## 2023-11-19 MED ORDER — HYDROCODONE-ACETAMINOPHEN 10-325 MG PO TABS: 1.0000 | ORAL_TABLET | Freq: Two times a day (BID) | ORAL | 0 refills | Status: DC | PRN

## 2023-11-19 NOTE — Telephone Encounter (Signed)
 Sent in

## 2023-11-19 NOTE — Addendum Note (Signed)
 Addended by: ROLLENE NORRIS A on: 11/19/2023 10:34 AM   Modules accepted: Orders

## 2023-11-23 ENCOUNTER — Ambulatory Visit: Admitting: Internal Medicine

## 2023-11-23 ENCOUNTER — Encounter: Payer: Self-pay | Admitting: Internal Medicine

## 2023-11-23 VITALS — BP 122/80 | HR 83 | Temp 97.7°F | Ht 72.0 in | Wt 208.0 lb

## 2023-11-23 DIAGNOSIS — E2749 Other adrenocortical insufficiency: Secondary | ICD-10-CM | POA: Diagnosis not present

## 2023-11-23 DIAGNOSIS — Z23 Encounter for immunization: Secondary | ICD-10-CM | POA: Diagnosis not present

## 2023-11-23 DIAGNOSIS — J431 Panlobular emphysema: Secondary | ICD-10-CM

## 2023-11-23 DIAGNOSIS — Z7984 Long term (current) use of oral hypoglycemic drugs: Secondary | ICD-10-CM

## 2023-11-23 DIAGNOSIS — C3491 Malignant neoplasm of unspecified part of right bronchus or lung: Secondary | ICD-10-CM

## 2023-11-23 DIAGNOSIS — E118 Type 2 diabetes mellitus with unspecified complications: Secondary | ICD-10-CM

## 2023-11-23 DIAGNOSIS — Z720 Tobacco use: Secondary | ICD-10-CM

## 2023-11-23 DIAGNOSIS — E1169 Type 2 diabetes mellitus with other specified complication: Secondary | ICD-10-CM

## 2023-11-23 DIAGNOSIS — E785 Hyperlipidemia, unspecified: Secondary | ICD-10-CM

## 2023-11-23 DIAGNOSIS — F1120 Opioid dependence, uncomplicated: Secondary | ICD-10-CM

## 2023-11-23 MED ORDER — HYDROCODONE-ACETAMINOPHEN 10-325 MG PO TABS: 1.0000 | ORAL_TABLET | Freq: Three times a day (TID) | ORAL | 0 refills | Status: DC | PRN

## 2023-11-23 NOTE — Patient Instructions (Signed)
 We have sent in the new prescription for the hydrocodone  for #75 per month.

## 2023-11-23 NOTE — Progress Notes (Unsigned)
 Subjective:   Patient ID: Brandon Rubio., adult    DOB: November 26, 1950, 73 y.o.   MRN: [id number]  Discussed the use of AI scribe software for clinical note transcription with the patient, who gave verbal consent to proceed. History of Present Illness Brandon Rubio. is a 73 year old male with lung cancer and adrenal insufficiency who presents for vaccination updates and medication management.  He experiences frequent skin tears and bleeding with minor trauma, which he attributes to aging and thin skin. His dog sometimes exacerbates the issue by jumping on him.  He is seeking to update his vaccinations, preferring to receive them at the clinic to ensure proper documentation. He has previously received vaccinations at pharmacies but lacks records of these.  Approximately a month ago, he was hospitalized with suspected pneumonia, though the diagnosis was not confirmed. During this hospitalization, there was a discussion about his medication regimen, and he expressed concern about potential drug interactions due to multiple medications.  He has a history of adrenal insufficiency and is on dexamethasone , having switched from prednisone . He occasionally finds pills on the floor, indicating possible missed doses.  He experiences intermittent facial itching, which he initially attributed to his dog possibly carrying poison ivy, but now suspects it might be an allergic reaction to medication. The itching has subsided in the past week or two.  He continues to experience pain in his leg and hip, previously evaluated with x-rays, and attributes it to arthritis. He also reports chronic neck pain, which has been present for an extended period and has been evaluated with CT scans and carotid artery checks.  He reports decreased energy levels and difficulty with physical activities, such as using his boat, due to weakness and unsteadiness. He continues to smoke, though less than before.  He mentions  ongoing lung issues, with a history of right lung cancer and recent monitoring of the left lung for potential growths. He has not received cancer treatment in the past four months and is scheduled for a follow-up in a few weeks.  He takes pain medication as needed, breaking pills in half to manage his supply, and requests an adjustment to his prescription to avoid running short. No chest pain, tightness, pressure, or heart racing. Breathing is 'halfway good' and sufficient to reach the mailbox.  Review of Systems  Objective:  Physical Exam  Vitals:   11/23/23 1524  BP: 122/80  Pulse: 83  Temp: 97.7 F (36.5 C)  TempSrc: Oral  SpO2: 96%  Weight: 208 lb (94.3 kg)  Height: 6' (1.829 m)    Assessment and Plan Assessment & Plan Lung cancer with right and left lung involvement   The right lung shows a slight reduction in size after treatment, while spots on the left lung are growing, possibly indicating a different cancer type. He has an oncologist appointment scheduled in two weeks to discuss a potential left lung biopsy.  Adrenal insufficiency on steroid replacement   His condition is managed with dexamethasone . A recent hospitalization may have been due to missed doses, so medication adherence is emphasized, especially during illness or stress. He should continue dexamethasone  as prescribed and ensure adherence to the medication regimen.  Chronic obstructive pulmonary disease (COPD)   His symptoms allow him to perform daily activities like getting to the mailbox.  Nicotine  dependence, current smoker   He is a current smoker with reduced frequency. The risks of continued smoking, including heart disease and lung cancer, were discussed. He expressed difficulty  quitting.  Osteoarthritis of hips and legs  Type 2 diabetes mellitus   His diabetes is managed by an endocrinologist.  Intermittent pruritus, facial and other areas   Symptoms have resolved in the last week or two. Monitor  for recurrence of symptoms.

## 2023-11-24 DIAGNOSIS — J449 Chronic obstructive pulmonary disease, unspecified: Secondary | ICD-10-CM | POA: Insufficient documentation

## 2023-11-24 DIAGNOSIS — F1120 Opioid dependence, uncomplicated: Secondary | ICD-10-CM | POA: Insufficient documentation

## 2023-11-24 NOTE — Assessment & Plan Note (Signed)
 Using albuterol  inhaler and able to walk but shorter distances. Not interested in smoking cessation at this time. Current lung cancer.

## 2023-11-24 NOTE — Assessment & Plan Note (Signed)
 His condition is managed with dexamethasone . A recent hospitalization may have been due to missed doses, so medication adherence is emphasized, especially during illness or stress. He should continue dexamethasone  as prescribed and ensure adherence to the medication regimen.

## 2023-11-24 NOTE — Assessment & Plan Note (Signed)
 Due to cancer pain and OA pain. Using hydrocodone  10/325 #75/month. Increase done at visit due to worsening pain (may be due to growth of cancer in lung). Review of PDMP is appropriate.

## 2023-11-24 NOTE — Assessment & Plan Note (Addendum)
 The right lung shows a slight reduction in size after treatment, while spots on the left lung are growing, possibly indicating a different cancer type. He has a pulmonologist appointment scheduled in two weeks to discuss a potential left lung biopsy. Worsening pain and may be related to growth of his cancer in left lung. We are increasing hydrocodone  to #75/month and new rx done at visit.

## 2023-11-24 NOTE — Assessment & Plan Note (Signed)
 Taking simvastatin  and will continue. Given active cancer which is progressive we elect not to routinely monitor his lipid panel but keep statin.

## 2023-11-24 NOTE — Assessment & Plan Note (Signed)
 He is a current smoker with reduced frequency. The risks of continued smoking, including heart disease and lung cancer, were discussed. He expressed difficulty quitting and not willing to make attempt at this time.

## 2023-11-24 NOTE — Assessment & Plan Note (Signed)
 His diabetes is managed by an endocrinologist and is poorly controlled due to ongoing steroids for his adrenal insufficiency. He is taking metformin , complicated by neuropathy and hyperlipidemia. Given his active cancer diagnosis more aggressive treatment is not preferred.

## 2023-12-03 ENCOUNTER — Other Ambulatory Visit: Payer: Self-pay | Admitting: Internal Medicine

## 2023-12-17 ENCOUNTER — Encounter: Payer: Self-pay | Admitting: Emergency Medicine

## 2023-12-17 ENCOUNTER — Telehealth: Payer: Self-pay | Admitting: Emergency Medicine

## 2023-12-17 ENCOUNTER — Ambulatory Visit (INDEPENDENT_AMBULATORY_CARE_PROVIDER_SITE_OTHER): Admitting: Emergency Medicine

## 2023-12-17 VITALS — BP 116/86 | HR 74 | Temp 98.0°F | Ht 72.0 in | Wt 209.8 lb

## 2023-12-17 DIAGNOSIS — J449 Chronic obstructive pulmonary disease, unspecified: Secondary | ICD-10-CM | POA: Diagnosis not present

## 2023-12-17 DIAGNOSIS — R918 Other nonspecific abnormal finding of lung field: Secondary | ICD-10-CM | POA: Diagnosis not present

## 2023-12-17 DIAGNOSIS — J431 Panlobular emphysema: Secondary | ICD-10-CM

## 2023-12-17 DIAGNOSIS — Z720 Tobacco use: Secondary | ICD-10-CM

## 2023-12-17 DIAGNOSIS — C3490 Malignant neoplasm of unspecified part of unspecified bronchus or lung: Secondary | ICD-10-CM

## 2023-12-17 DIAGNOSIS — F17210 Nicotine dependence, cigarettes, uncomplicated: Secondary | ICD-10-CM | POA: Diagnosis not present

## 2023-12-17 NOTE — Assessment & Plan Note (Signed)
 New left lung ground glass mass lesions in a patient who has been treated for right sided adenocarcinoma.  Very concerning for recurrence.  Discussed this with him in detail today.  I have recommended a navigational bronchoscopy biopsies to facilitate discussion about treatment options, molecular testing, etc.  He understands and agrees.  We reviewed your CT scan of the chest, abdomen today We will work on setting up a repeat bronchoscopy to evaluate suspicious lesions in the left lung.  This will be done under general anesthesia as an outpatient at [facility].  You will need a designated driver and someone to watch you that day after the procedure at home.  You will need to stop your aspirin  for 2 days prior to the procedure.  Will try to get this scheduled in October 2025. We will arrange for repeat CT scan of the chest to help with the bronchoscopy procedure. We will arrange for follow-up in our office to review your biopsy results

## 2023-12-17 NOTE — H&P (View-Only) (Signed)
 Subjective:    Patient ID: Brandon Maryann Raddle., male    DOB: 23-Jun-1950, 73 y.o.   MRN: [id number]  HPI  ROV 12/17/2023 --73 year old gentleman whom I have seen in the past for heavy tobacco use, spontaneous pneumothorax (remote), stage III A adenocarcinoma that presented as a large right lower lobe mass without mediastinal adenopathy or extrathoracic metastatic disease.  I have not seen him since 2022 when his diagnosis was made.  He was treated with second line chemotherapy by Dr. Sherrod but this was held in 03/2023 due to toxicity and a hospitalization. PMH: CAD, diabetes, hypertension He reports that he is feeling some SOB w exertion. He has albuterol  but never uses it. He was given trelegy but never used it.  Still smoking about 2 pk/day. He lives a sedentary lifestyle.   CT chest abdomen pelvis 10/21/2023 reviewed by me, showed no axillary or supraclavicular adenopathy, no mediastinal or hilar adenopathy.  There is medial right lower lobe consolidation with central calcification and air bronchograms measuring 5 cm, large ground glass mass in the left lower lobe 6.4 x 5.7 cm (larger than April 2025), multiple left upper lobe ground glass nodules again noted (increased)   Review of Systems As per HPi  Past Medical History:  Diagnosis Date   CAD (coronary artery disease) 10/2007   NSTEMI w/ dLAD occlusion >> med rx   Cancer (HCC)    SKIN.SABRABASAL CELL   Cataract    Diabetes mellitus    2   DVT (deep venous thrombosis) (HCC) 03/30/2001   RLE DVT, post ankle fracture   GERD (gastroesophageal reflux disease)    Hyperlipidemia    Hypertension    Lung cancer (HCC) 12/10/2020   Myocardial infarct, 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   Peripheral neuropathy    toes     Family History  Problem Relation Age of Onset   Lung cancer Mother        X 2   Colon polyps Father    Colon cancer Father 36   Coronary artery disease Father    Lung disease Father    Esophageal cancer Neg Hx    Stomach cancer Neg  Hx    Rectal cancer Neg Hx      Social History   Socioeconomic History   Marital status: Divorced    Spouse name: Not on file   Number of children: 0   Years of education: Not on file   Highest education level: Not on file  Occupational History   Occupation: Retired Personnel officer  Tobacco Use   Smoking status: Every Day    Current packs/day: 1.50    Average packs/day: 1.5 packs/day for 63.8 years (95.7 ttl pk-yrs)    Types: Cigarettes    Start date: 03/10/1960   Smokeless tobacco: Never   Tobacco comments:    2 packs per day.  Not trying to quick.  Slowing down.  Was smoking 3 packs.  12/17/2023 hfb RN  Vaping Use   Vaping status: Never Used  Substance and Sexual Activity   Alcohol use: No    Alcohol/week: 0.0 standard drinks of alcohol    Comment:  none since 04/2012   Drug use: Never   Sexual activity: Not Currently  Other Topics Concern   Not on file  Social History Narrative   REG EXERCISE         Social Drivers of Health   Financial Resource Strain: Low Risk  (09/17/2023)   Overall Financial Resource Strain (CARDIA)    Difficulty  of Paying Living Expenses: Not hard at all  Food Insecurity: No Food Insecurity (09/17/2023)   Hunger Vital Sign    Worried About Running Out of Food in the Last Year: Never true    Ran Out of Food in the Last Year: Never true  Transportation Needs: No Transportation Needs (09/17/2023)   PRAPARE - Administrator, Civil Service (Medical): No    Lack of Transportation (Non-Medical): No  Physical Activity: Inactive (09/17/2023)   Exercise Vital Sign    Days of Exercise per Week: 0 days    Minutes of Exercise per Session: 0 min  Stress: No Stress Concern Present (09/17/2023)   [employer] of Occupational Health - Occupational Stress Questionnaire    Feeling of Stress: Only a little  Social Connections: Socially Isolated (09/17/2023)   Social Connection and Isolation Panel    Frequency of Communication with Friends and Family:  More than three times a week    Frequency of Social Gatherings with Friends and Family: More than three times a week    Attends Religious Services: Never    Database administrator or Organizations: No    Attends Banker Meetings: Never    Marital Status: Widowed  Intimate Partner Violence: Not At Risk (09/17/2023)   Humiliation, Afraid, Rape, and Kick questionnaire    Fear of Current or Ex-Partner: No    Emotionally Abused: No    Physically Abused: No    Sexually Abused: No    Was in the [employer], [employer] 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 Because of a history of documented adverse serious drug reaction;[name] Alert bracelet  is recommended   Albumin  (Human) Hives and Itching   Cyramza  [Ramucirumab ] Itching     Outpatient Medications Prior to Visit  Medication Sig Dispense Refill   aspirin  EC 81 MG tablet Take 81 mg by mouth in the morning. Swallow whole.     dexamethasone  (DECADRON ) 0.75 MG tablet Take 1 tablet (0.75 mg total) by mouth daily. 100 tablet 1   HYDROcodone -acetaminophen  (NORCO) 10-325 MG tablet Take 1 tablet by mouth every 8 (eight) hours as needed. 75 tablet 0   metFORMIN  (GLUCOPHAGE ) 1000 MG tablet TAKE 1 TABLET(1000 MG) BY MOUTH TWICE DAILY WITH A MEAL 180 tablet 0   metoprolol  tartrate (LOPRESSOR ) 25 MG tablet Take 1 tablet (25 mg total) by mouth 2 (two) times daily. 180 tablet 2   omeprazole  (PRILOSEC) 20 MG capsule TAKE 1 CAPSULE BY MOUTH TWICE DAILY BEFORE A MEAL 180 capsule 1   pantoprazole  (PROTONIX ) 40 MG tablet Take 1 tablet (40 mg total) by mouth daily. 90 tablet 3   simvastatin  (ZOCOR ) 40 MG tablet TAKE 1 TABLET BY MOUTH EVERY EVENING AT 6 PM 90 tablet 0   albuterol  (VENTOLIN  HFA) 108 (90 Base) MCG/ACT inhaler Inhale 2 puffs into the lungs every 6 (six) hours as needed for wheezing or shortness  of breath. (Patient not taking: Reported on 12/17/2023) 8 g 2   feeding supplement (ENSURE ENLIVE / ENSURE PLUS) LIQD Take 237 mLs by mouth 3 (three) times daily between meals. (Patient not taking: Reported on 12/17/2023) 237 mL 12   Fluticasone-Umeclidin-Vilant (TRELEGY ELLIPTA ) 100-62.5-25 MCG/ACT AEPB Inhale 1 puff into the lungs daily. (Patient not taking: Reported on 12/17/2023) 1 each 11   lidocaine  (LIDODERM ) 5 % Place 1 patch onto the  skin daily. Remove & Discard patch within 12 hours or as directed by MD (Patient not taking: Reported on 12/17/2023) 30 patch 0   magnesium  oxide (MAG-OX) 400 (240 Mg) MG tablet Take 1 tablet (400 mg total) by mouth 2 (two) times daily. (Patient not taking: Reported on 12/17/2023) 180 tablet 3   Multiple Vitamins-Minerals (CENTRUM SILVER 50+MEN) TABS Take 1 tablet by mouth daily with breakfast. (Patient not taking: Reported on 12/17/2023)     nicotine  (NICODERM CQ  - DOSED IN MG/24 HOURS) 14 mg/24hr patch Place 1 patch (14 mg total) onto the skin daily. (Patient not taking: Reported on 12/17/2023) 28 patch 0   No facility-administered medications prior to visit.          Objective:   Physical Exam Vitals:   12/17/23 0913  BP: 116/86  Pulse: 74  Temp: 98 F (36.7 C)  TempSrc: Oral  SpO2: 93%  Weight: 209 lb 12.8 oz (95.2 kg)  Height: 6' (1.829 m)    Gen: Pleasant, obese man, in no distress,  normal affect  ENT: No lesions,  mouth clear,  oropharynx clear, no postnasal drip  Neck: No JVD, no stridor  Lungs: No use of accessory muscles, focal right basilar inspiratory crackles, no wheeze  Cardiovascular: RRR, heart sounds normal, no murmur or gallops, no peripheral edema  Musculoskeletal: No deformities, no cyanosis or clubbing  Neuro: alert, awake, non focal  Skin: Warm, no lesions or rash      Assessment & Plan:  Lung mass New left lung ground glass mass lesions in a patient who has been treated for right sided adenocarcinoma.  Very  concerning for recurrence.  Discussed this with him in detail today.  I have recommended a navigational bronchoscopy biopsies to facilitate discussion about treatment options, molecular testing, etc.  He understands and agrees.  We reviewed your CT scan of the chest, abdomen today We will work on setting up a repeat bronchoscopy to evaluate suspicious lesions in the left lung.  This will be done under general anesthesia as an outpatient at [facility].  You will need a designated driver and someone to watch you that day after the procedure at home.  You will need to stop your aspirin  for 2 days prior to the procedure.  Will try to get this scheduled in October 2025. We will arrange for repeat CT scan of the chest to help with the bronchoscopy procedure. We will arrange for follow-up in our office to review your biopsy results  Tobacco abuse Discussed cessation with him today.  He is still a heavy smoker.  I recommended that he work to try and cut down before considering setting a quit date.  He is going to contemplate this.  COPD (chronic obstructive pulmonary disease) (HCC) He has COPD and does sometimes feel shortness of breath exertion.  I talked him today about possibly starting scheduled BD therapy as well as smoking cessation.  He wants to avoid scheduled inhaled medication, continue albuterol  as needed for now.  We can revisit this going forward.  Time spent 36 minutes reviewing CT scan, differential diagnosis, discussing pros and cons of bronchoscopy and planning the procedure.  Also discussing his COPD medications and smoking cessation.  Lamar Chris, MD, PhD 12/17/2023, 12:17 PM [facility] [phone] or if no answer before 7:00PM call [phone] For any issues after 7:00PM please call eLink [phone]

## 2023-12-17 NOTE — Telephone Encounter (Signed)
 Letter given by the nurse case # [id number] will send to [facility] to check Auth

## 2023-12-17 NOTE — Telephone Encounter (Signed)
 Please schedule the following:  Provider performing procedure: Tal Kempker Diagnosis: Left upper lobe and lower lobe masses Which side if for nodule / mass?  Left Procedure: Robotic assisted navigational bronchoscopy Has patient been spoken to by Provider and given informed consent?  Yes Anesthesia: General Do you need Fluro?  Yes Duration of procedure: 45 minutes Date: 12/29/2023 Alternate Date:   Time: Any Location: [facility] Does patient have OSA?  No DM?  Yes or Latex allergy?  No Medication Restriction/ Anticoagulate/Antiplatelet: Stop aspirin  2 days prior Pre-op Labs Ordered:determined by Anesthesia Imaging request: New super D CT chest has been ordered (If, SuperDimension CT Chest, please have STAT courier sent to ENDO)

## 2023-12-17 NOTE — Progress Notes (Signed)
 Subjective:    Patient ID: Brandon Maryann Raddle., male    DOB: 23-Jun-1950, 73 y.o.   MRN: [id number]  HPI  ROV 12/17/2023 --73 year old gentleman whom I have seen in the past for heavy tobacco use, spontaneous pneumothorax (remote), stage III A adenocarcinoma that presented as a large right lower lobe mass without mediastinal adenopathy or extrathoracic metastatic disease.  I have not seen him since 2022 when his diagnosis was made.  He was treated with second line chemotherapy by Dr. Sherrod but this was held in 03/2023 due to toxicity and a hospitalization. PMH: CAD, diabetes, hypertension He reports that he is feeling some SOB w exertion. He has albuterol  but never uses it. He was given trelegy but never used it.  Still smoking about 2 pk/day. He lives a sedentary lifestyle.   CT chest abdomen pelvis 10/21/2023 reviewed by me, showed no axillary or supraclavicular adenopathy, no mediastinal or hilar adenopathy.  There is medial right lower lobe consolidation with central calcification and air bronchograms measuring 5 cm, large ground glass mass in the left lower lobe 6.4 x 5.7 cm (larger than April 2025), multiple left upper lobe ground glass nodules again noted (increased)   Review of Systems As per HPi  Past Medical History:  Diagnosis Date   CAD (coronary artery disease) 10/2007   NSTEMI w/ dLAD occlusion >> med rx   Cancer (HCC)    SKIN.SABRABASAL CELL   Cataract    Diabetes mellitus    2   DVT (deep venous thrombosis) (HCC) 03/30/2001   RLE DVT, post ankle fracture   GERD (gastroesophageal reflux disease)    Hyperlipidemia    Hypertension    Lung cancer (HCC) 12/10/2020   Myocardial infarct, 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   Peripheral neuropathy    toes     Family History  Problem Relation Age of Onset   Lung cancer Mother        X 2   Colon polyps Father    Colon cancer Father 36   Coronary artery disease Father    Lung disease Father    Esophageal cancer Neg Hx    Stomach cancer Neg  Hx    Rectal cancer Neg Hx      Social History   Socioeconomic History   Marital status: Divorced    Spouse name: Not on file   Number of children: 0   Years of education: Not on file   Highest education level: Not on file  Occupational History   Occupation: Retired Personnel officer  Tobacco Use   Smoking status: Every Day    Current packs/day: 1.50    Average packs/day: 1.5 packs/day for 63.8 years (95.7 ttl pk-yrs)    Types: Cigarettes    Start date: 03/10/1960   Smokeless tobacco: Never   Tobacco comments:    2 packs per day.  Not trying to quick.  Slowing down.  Was smoking 3 packs.  12/17/2023 hfb RN  Vaping Use   Vaping status: Never Used  Substance and Sexual Activity   Alcohol use: No    Alcohol/week: 0.0 standard drinks of alcohol    Comment:  none since 04/2012   Drug use: Never   Sexual activity: Not Currently  Other Topics Concern   Not on file  Social History Narrative   REG EXERCISE         Social Drivers of Health   Financial Resource Strain: Low Risk  (09/17/2023)   Overall Financial Resource Strain (CARDIA)    Difficulty  of Paying Living Expenses: Not hard at all  Food Insecurity: No Food Insecurity (09/17/2023)   Hunger Vital Sign    Worried About Running Out of Food in the Last Year: Never true    Ran Out of Food in the Last Year: Never true  Transportation Needs: No Transportation Needs (09/17/2023)   PRAPARE - Administrator, Civil Service (Medical): No    Lack of Transportation (Non-Medical): No  Physical Activity: Inactive (09/17/2023)   Exercise Vital Sign    Days of Exercise per Week: 0 days    Minutes of Exercise per Session: 0 min  Stress: No Stress Concern Present (09/17/2023)   [employer] of Occupational Health - Occupational Stress Questionnaire    Feeling of Stress: Only a little  Social Connections: Socially Isolated (09/17/2023)   Social Connection and Isolation Panel    Frequency of Communication with Friends and Family:  More than three times a week    Frequency of Social Gatherings with Friends and Family: More than three times a week    Attends Religious Services: Never    Database administrator or Organizations: No    Attends Banker Meetings: Never    Marital Status: Widowed  Intimate Partner Violence: Not At Risk (09/17/2023)   Humiliation, Afraid, Rape, and Kick questionnaire    Fear of Current or Ex-Partner: No    Emotionally Abused: No    Physically Abused: No    Sexually Abused: No    Was in the [employer], [employer] 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 Because of a history of documented adverse serious drug reaction;[name] Alert bracelet  is recommended   Albumin  (Human) Hives and Itching   Cyramza  [Ramucirumab ] Itching     Outpatient Medications Prior to Visit  Medication Sig Dispense Refill   aspirin  EC 81 MG tablet Take 81 mg by mouth in the morning. Swallow whole.     dexamethasone  (DECADRON ) 0.75 MG tablet Take 1 tablet (0.75 mg total) by mouth daily. 100 tablet 1   HYDROcodone -acetaminophen  (NORCO) 10-325 MG tablet Take 1 tablet by mouth every 8 (eight) hours as needed. 75 tablet 0   metFORMIN  (GLUCOPHAGE ) 1000 MG tablet TAKE 1 TABLET(1000 MG) BY MOUTH TWICE DAILY WITH A MEAL 180 tablet 0   metoprolol  tartrate (LOPRESSOR ) 25 MG tablet Take 1 tablet (25 mg total) by mouth 2 (two) times daily. 180 tablet 2   omeprazole  (PRILOSEC) 20 MG capsule TAKE 1 CAPSULE BY MOUTH TWICE DAILY BEFORE A MEAL 180 capsule 1   pantoprazole  (PROTONIX ) 40 MG tablet Take 1 tablet (40 mg total) by mouth daily. 90 tablet 3   simvastatin  (ZOCOR ) 40 MG tablet TAKE 1 TABLET BY MOUTH EVERY EVENING AT 6 PM 90 tablet 0   albuterol  (VENTOLIN  HFA) 108 (90 Base) MCG/ACT inhaler Inhale 2 puffs into the lungs every 6 (six) hours as needed for wheezing or shortness  of breath. (Patient not taking: Reported on 12/17/2023) 8 g 2   feeding supplement (ENSURE ENLIVE / ENSURE PLUS) LIQD Take 237 mLs by mouth 3 (three) times daily between meals. (Patient not taking: Reported on 12/17/2023) 237 mL 12   Fluticasone-Umeclidin-Vilant (TRELEGY ELLIPTA ) 100-62.5-25 MCG/ACT AEPB Inhale 1 puff into the lungs daily. (Patient not taking: Reported on 12/17/2023) 1 each 11   lidocaine  (LIDODERM ) 5 % Place 1 patch onto the  skin daily. Remove & Discard patch within 12 hours or as directed by MD (Patient not taking: Reported on 12/17/2023) 30 patch 0   magnesium  oxide (MAG-OX) 400 (240 Mg) MG tablet Take 1 tablet (400 mg total) by mouth 2 (two) times daily. (Patient not taking: Reported on 12/17/2023) 180 tablet 3   Multiple Vitamins-Minerals (CENTRUM SILVER 50+MEN) TABS Take 1 tablet by mouth daily with breakfast. (Patient not taking: Reported on 12/17/2023)     nicotine  (NICODERM CQ  - DOSED IN MG/24 HOURS) 14 mg/24hr patch Place 1 patch (14 mg total) onto the skin daily. (Patient not taking: Reported on 12/17/2023) 28 patch 0   No facility-administered medications prior to visit.          Objective:   Physical Exam Vitals:   12/17/23 0913  BP: 116/86  Pulse: 74  Temp: 98 F (36.7 C)  TempSrc: Oral  SpO2: 93%  Weight: 209 lb 12.8 oz (95.2 kg)  Height: 6' (1.829 m)    Gen: Pleasant, obese man, in no distress,  normal affect  ENT: No lesions,  mouth clear,  oropharynx clear, no postnasal drip  Neck: No JVD, no stridor  Lungs: No use of accessory muscles, focal right basilar inspiratory crackles, no wheeze  Cardiovascular: RRR, heart sounds normal, no murmur or gallops, no peripheral edema  Musculoskeletal: No deformities, no cyanosis or clubbing  Neuro: alert, awake, non focal  Skin: Warm, no lesions or rash      Assessment & Plan:  Lung mass New left lung ground glass mass lesions in a patient who has been treated for right sided adenocarcinoma.  Very  concerning for recurrence.  Discussed this with him in detail today.  I have recommended a navigational bronchoscopy biopsies to facilitate discussion about treatment options, molecular testing, etc.  He understands and agrees.  We reviewed your CT scan of the chest, abdomen today We will work on setting up a repeat bronchoscopy to evaluate suspicious lesions in the left lung.  This will be done under general anesthesia as an outpatient at [facility].  You will need a designated driver and someone to watch you that day after the procedure at home.  You will need to stop your aspirin  for 2 days prior to the procedure.  Will try to get this scheduled in October 2025. We will arrange for repeat CT scan of the chest to help with the bronchoscopy procedure. We will arrange for follow-up in our office to review your biopsy results  Tobacco abuse Discussed cessation with him today.  He is still a heavy smoker.  I recommended that he work to try and cut down before considering setting a quit date.  He is going to contemplate this.  COPD (chronic obstructive pulmonary disease) (HCC) He has COPD and does sometimes feel shortness of breath exertion.  I talked him today about possibly starting scheduled BD therapy as well as smoking cessation.  He wants to avoid scheduled inhaled medication, continue albuterol  as needed for now.  We can revisit this going forward.  Time spent 36 minutes reviewing CT scan, differential diagnosis, discussing pros and cons of bronchoscopy and planning the procedure.  Also discussing his COPD medications and smoking cessation.  Lamar Chris, MD, PhD 12/17/2023, 12:17 PM [facility] [phone] or if no answer before 7:00PM call [phone] For any issues after 7:00PM please call eLink [phone]

## 2023-12-17 NOTE — Assessment & Plan Note (Signed)
 He has COPD and does sometimes feel shortness of breath exertion.  I talked him today about possibly starting scheduled BD therapy as well as smoking cessation.  He wants to avoid scheduled inhaled medication, continue albuterol  as needed for now.  We can revisit this going forward.

## 2023-12-17 NOTE — Assessment & Plan Note (Signed)
 Discussed cessation with him today.  He is still a heavy smoker.  I recommended that he work to try and cut down before considering setting a quit date.  He is going to contemplate this.

## 2023-12-17 NOTE — Patient Instructions (Addendum)
 We reviewed your CT scan of the chest, abdomen today We will work on setting up a repeat bronchoscopy to evaluate suspicious lesions in the left lung.  This will be done under general anesthesia as an outpatient at [facility].  You will need a designated driver and someone to watch you that day after the procedure at home.  You will need to stop your aspirin  for 2 days prior to the procedure.  Will try to get this scheduled in October 2025. We will arrange for repeat CT scan of the chest to help with the bronchoscopy procedure. Keep your albuterol  available to use 2 puffs when needed for shortness of breath, chest tightness, wheezing.  We talked today about possibly starting a different every day scheduled inhaler.  We will hold off for now. You need to work on decreasing your cigarettes We will arrange for follow-up in our office to review your biopsy results

## 2023-12-18 ENCOUNTER — Ambulatory Visit (HOSPITAL_COMMUNITY): Admission: RE | Admit: 2023-12-18 | Source: Ambulatory Visit

## 2023-12-20 ENCOUNTER — Other Ambulatory Visit: Payer: Self-pay | Admitting: Internal Medicine

## 2023-12-21 ENCOUNTER — Telehealth: Payer: Self-pay | Admitting: *Deleted

## 2023-12-21 NOTE — Telephone Encounter (Signed)
 Discussed and reviewed pt. scheduled appointments for CT scans, lab work, and Dr. Glennis from 12/24/23-01/26/24.

## 2023-12-24 ENCOUNTER — Ambulatory Visit (HOSPITAL_COMMUNITY)
Admission: RE | Admit: 2023-12-24 | Discharge: 2023-12-24 | Disposition: A | Discharge status: Home / Self Care | Source: Ambulatory Visit | Attending: Internal Medicine | Admitting: Internal Medicine

## 2023-12-24 DIAGNOSIS — C3491 Malignant neoplasm of unspecified part of right bronchus or lung: Secondary | ICD-10-CM | POA: Diagnosis not present

## 2023-12-24 DIAGNOSIS — J432 Centrilobular emphysema: Secondary | ICD-10-CM | POA: Diagnosis not present

## 2023-12-24 DIAGNOSIS — C3490 Malignant neoplasm of unspecified part of unspecified bronchus or lung: Secondary | ICD-10-CM | POA: Diagnosis not present

## 2023-12-24 DIAGNOSIS — C3492 Malignant neoplasm of unspecified part of left bronchus or lung: Secondary | ICD-10-CM | POA: Diagnosis not present

## 2023-12-28 ENCOUNTER — Encounter (HOSPITAL_COMMUNITY): Payer: Self-pay | Admitting: Emergency Medicine

## 2023-12-28 ENCOUNTER — Other Ambulatory Visit: Payer: Self-pay

## 2023-12-28 NOTE — Progress Notes (Signed)
 Anesthesia Chart Review: SAME DAY WORK-UP  Case: [id number] Date/Time: 12/29/23 0930   Procedure: VIDEO BRONCHOSCOPY WITH ENDOBRONCHIAL NAVIGATION (Left)   Anesthesia type: General   Diagnosis: Malignant neoplasm of lung, unspecified laterality, unspecified part of lung (HCC) [C34.90]   Pre-op diagnosis: Left upper lobe and lower lobe masses   [facility] / [facility]   Surgeons: Shelah Lamar RAMAN, MD       DISCUSSION: Patient is a 73 year old male scheduled for the above procedure. Right lung cancer diagnosed in 12/2020 with disease procession in 2023. He received chemoradiation and immunotherapy, but has been in observation since ~ May 2025 due to side effects/sepsis. Left lund nodules/densities with growth on August 2025 CT. Above procedure advised, as well as smoking cessation.   History includes smoking, CAD (NSTEMI 12/11/07, LHC 12/13/07 99% dLAD, 40% Ramus, 50% PDA, irregularities RCA, medical therapy given small dLAD), HTN, HLD, DM2, peripheral neuropathy (hands), GERD, DVT (right PT DVT 03/30/01, post right ankle fracture), skin cancer (BCC), RLL lung cancer (12/2020, with disease progression 02/2021, s/p chemoradiation and immunotherapy; therapy held since May 2025 due to toxicity/sepsis hospitalization), adrenal insufficiency (diagnosed ~ April 2023, thought related to cancer therapies including steroid during chemotherapy; he also suspected effects of radiation), spontaneous PTX (age 29's).     He had been established with cardiologist Obie Pin, MD following 2009 NSTEMI but had advised as needed follow-up with primary care for secondary prevention at his 10/04/2008 visit. More recently, he had in-patient evaluation by cardiology in July 2024 for PSVT in setting of chemotherapy for stage IV lung cancer, as well as some electrolyte abnormalities. He was treated with amiodarone  and b-blocker. Amiodarone  discontinued prior to discharge given lung disease. TTE showed  LVEF 60-65%, no RWMA, normal RV systolic function, 40 mm ascending aorta, no significant valvular disease. Re-evaluated again during April 2025 sepsis admission for evaluation of SVT vs VT. He received amiodarone , but again discontinued prior to discharge. Echo showed EF 55-60% with no significant changes from prior readings (of note, Retrospectively, the area of apical hypokinesis, consistent with distal LAD artery territory infarction, was also present on the 2023 and 2024 studies). Initial rhythm attributed to stress of PNA/sepsis. Toprol  resumed. Last visit was with Dr. Jeffrie on 08/06/2023. HR controlled on metoprolol . Follow-up ~ 6 months planned.   Last A1c noted was 9.2% on 06/29/2023. He is on metformin  1000 mg BID. He does not consistently check home CBGs.  He is on Decadron  0.75 mg daily for adrenal insufficiency.   He reported instructions to hold aspirin  2 days prior to surgery. Last dose 12/26/2023.   Anesthesia team to evaluate on the day of surgery.    VS: Ht 6' (1.829 m)   Wt 95.2 kg   BMI 28.46 kg/m   BP Readings from Last 3 Encounters:  12/17/23 116/86  11/23/23 122/80  10/28/23 118/79   Pulse Readings from Last 3 Encounters:  12/17/23 74  11/23/23 83  10/28/23 80    PROVIDERS: Rollene Almarie LABOR, MD is PCP  Jeffrie Anes, MD is cardiologist Shelah Lamar, MD is pulmonologist Trixie File, MD is endocrinologist Sherrod Sherrod, MD is [facility] Dewey Rush, MD is RAD-ONC   LABS: Most recent lab results in [facility] include: Lab Results  Component Value Date   WBC 8.5 10/21/2023   HGB 12.0 (L) 10/21/2023   HCT 35.7 (L) 10/21/2023   PLT 144 (L) 10/21/2023   GLUCOSE 316 (H) 10/21/2023   CHOL 193 06/29/2023   TRIG  368 (H) 06/29/2023   HDL 62 06/29/2023   LDLDIRECT 62.0 10/02/2020   LDLCALC 82 06/29/2023   ALT 12 10/21/2023   AST 11 (L) 10/21/2023   NA 136 10/21/2023   K 4.0 10/21/2023   CL 102 10/21/2023   CREATININE 1.13 10/21/2023   BUN 16  10/21/2023   CO2 25 10/21/2023   TSH 5.070 (H) 07/22/2023   INR 1.1 07/05/2023   HGBA1C 9.2 (H) 06/29/2023   MICROALBUR 4.4 06/30/2023    OTHER: EEG 07/10/2023: IMPRESSION: This study is suggestive of mild diffuse encephalopathy. No seizures or epileptiform discharges were seen throughout the recording.   PFTs 11/15/2020: FVC 4.58 (97%), post 4.59 (97%). FEV1 3.11 (90%), post 3.14 (91%). DLCO unc/cor 21.62 (79%).    IMAGES: CT Super D Chest 12/24/2023: In process.   CT Chest/abd/pelvis 10/21/2023: IMPRESSION: CHEST: 1. Interval increase in size of ground-glass nodule/mass in the LEFT lower lobe. 2. Interval increase in density of LEFT upper lobe ground-glass nodules. 3. Findings suggestive of lepidic growth of adenocarcinomas in the LEFT lung. 4. Interval contraction of consolidation in the medial RIGHT lower lobe mass consistent post treatment. 5. No mediastinal adenopathy. PELVIS: 1. No evidence of metastatic disease in the abdomen pelvis. 2. No skeletal metastasis.   CT Head 07/05/2023: IMPRESSION: Atrophy and chronic small vessel ischemic changes. No acute intracranial process identified.   PET Scan 03/20/2023: IMPRESSION: 1. Multifocal adenocarcinoma, progressive from comparison examinations. 2. Presumed evolving post treatment changes in the right lower lobe. 3. Chronic gastric wall thickening. 4. Aortic atherosclerosis (ICD10-I70.0). Coronary artery calcification. Distal right common iliac artery aneurysm. 5.  Emphysema (ICD10-J43.9).   EKG: EKG 07/05/2023: Sinus tachycardia at 129 bpm Multiform ventricular premature complexes Borderline intraventricular conduction delay Low voltage, precordial leads Confirmed by Franklyn Gills [phone]) on 07/05/2023 9:13:04 PM   CV: Echo 07/07/2023: IMPRESSIONS   1. No left ventricular thrombus is seen. Left ventricular ejection  fraction, by estimation, is 55 to 60%. The left ventricle has normal  function. The left  ventricle demonstrates regional wall motion  abnormalities (see scoring diagram/findings for  description). Left ventricular diastolic parameters are consistent with  Grade I diastolic dysfunction (impaired relaxation). There is moderate  hypokinesis of the left ventricular, apical septal wall, anterior wall and  inferior wall.   2. Right ventricular systolic function is normal. The right ventricular  size is normal. There is normal pulmonary artery systolic pressure.   3. The mitral valve is normal in structure. No evidence of mitral valve  regurgitation. No evidence of mitral stenosis.   4. The aortic valve is tricuspid. There is mild calcification of the  aortic valve. Aortic valve regurgitation is not visualized. Aortic valve  sclerosis is present, with no evidence of aortic valve stenosis.   5. There is borderline dilatation of the ascending aorta, measuring 38  mm.   6. The inferior vena cava is normal in size with greater than 50%  respiratory variability, suggesting right atrial pressure of 3 mmHg.  - Comparison(s): No significant change from prior study. Prior images  reviewed side by side. Retrospectively, the area of apical hypokinesis,  consistent with distal LAD artery territory infarction, was also present  on the 2023 and 2024 studies.    US  Carotid 05/12/2021: Summary:  - Right Carotid: Velocities in the right ICA are consistent with a 1-39% stenosis.  - Left Carotid: Velocities in the left ICA are consistent with a 1-39% stenosis.  - Vertebrals: Bilateral vertebral arteries demonstrate antegrade flow.  Cardiac cath 12/13/2007: CONCLUSION:  Coronary artery disease with recent non-ST-elevation myocardial infarction with 99% stenosis in the distal LAD with TIMI 1 flow, 40% stenosis in the ramus branch of the circumflex artery, and 50% stenosis in the posterior descending branch of the circumflex artery (codominant) with irregularities in the right coronary artery, and  a small area of inferoapical wall akinesis with an estimated ejection fraction of 60%.   The culprit vessel is the distal LAD, which is a small vessel at this point.  I think medical therapy is indicated and continued aggressive secondary risk factor modification.   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   Peripheral neuropathy    toes    Past Surgical History:  Procedure Laterality Date   BRONCHIAL BIOPSY  12/10/2020   Procedure: BRONCHIAL BIOPSIES;  Surgeon: Shelah Lamar RAMAN, MD;  Location: [facility] ENDOSCOPY;  Service: Pulmonary;;   BRONCHIAL BRUSHINGS  12/10/2020   Procedure: BRONCHIAL BRUSHINGS;  Surgeon: Shelah Lamar RAMAN, MD;  Location: [facility] ENDOSCOPY;  Service: Pulmonary;;   BRONCHIAL NEEDLE ASPIRATION BIOPSY  12/10/2020   Procedure: BRONCHIAL NEEDLE ASPIRATION BIOPSIES;  Surgeon: Shelah Lamar RAMAN, MD;  Location: [facility] ENDOSCOPY;  Service: Pulmonary;;   COLONOSCOPY     COLONOSCOPY W/ POLYPECTOMY  03/10/2009   LARYNGOSCOPY  03/10/1997   VIDEO BRONCHOSCOPY WITH ENDOBRONCHIAL NAVIGATION N/A 12/10/2020   Procedure: ROBOTIC VIDEO BRONCHOSCOPY WITH ENDOBRONCHIAL NAVIGATION;  Surgeon: Shelah Lamar RAMAN, MD;  Location: [facility] ENDOSCOPY;  Service: Pulmonary;  Laterality: N/A;    MEDICATIONS: No current facility-administered medications for this encounter.    albuterol  (VENTOLIN  HFA) 108 (90 Base) MCG/ACT inhaler   aspirin  EC 81 MG tablet   dexamethasone  (DECADRON ) 0.75 MG tablet   feeding supplement (ENSURE ENLIVE / ENSURE PLUS) LIQD    Fluticasone-Umeclidin-Vilant (TRELEGY ELLIPTA ) 100-62.5-25 MCG/ACT AEPB   HYDROcodone -acetaminophen  (NORCO) 10-325 MG tablet   lidocaine  (LIDODERM ) 5 %   magnesium  oxide (MAG-OX) 400 (240 Mg) MG tablet   metFORMIN  (GLUCOPHAGE ) 1000 MG tablet   metoprolol  tartrate (LOPRESSOR ) 25 MG tablet   Multiple Vitamins-Minerals (CENTRUM SILVER 50+MEN) TABS   nicotine  (NICODERM CQ  - DOSED IN MG/24 HOURS) 14 mg/24hr patch   omeprazole  (PRILOSEC) 20 MG capsule   pantoprazole  (PROTONIX ) 40 MG tablet   simvastatin  (ZOCOR ) 40 MG tablet    Meds no listed as currently taking include Nicoderm CQ , MVI, Mag ox, Lidoderm , Trelegy, albuterol , Ensure.   Isaiah Ruder, PA-C Surgical Short Stay/Anesthesiology [facility] Phone [phone] [facility] Phone [phone] 12/28/2023 1:04 PM

## 2023-12-28 NOTE — Progress Notes (Addendum)
 PCP - Rollene Almarie LABOR, MD  Cardiologist - Jeffrie Oneil BROCKS, MD  Oncology -Sherrod Sherrod, MD   PPM/ICD - denies Device Orders - n/a Rep Notified - n/a  Chest x-ray - 07-05-23 EKG - 07-05-23 Stress Test -  per patient not sure when he had one ECHO - 07-07-23 Cardiac Cath -  PFT- 11-16-23  CPAP - denies  GLP-1 - denies  Dm - does not check regularly  Blood Thinner Instructions: denies Aspirin  Instructions: Last dose 12-26-23  ERAS Protcol - NPO  COVID TEST- n/a  Anesthesia review: Yes, hx Adrenal insufficiency, CAD, HTN, MI, DM, COPE  Patient verbally denies any shortness of breath, fever, cough and chest pain during phone call   -------------  SDW INSTRUCTIONS given:  Your procedure is scheduled on December 29, 2023.  Report to [facility] Main Entrance A at 7:00 A.M., and check in at the Admitting office.  Call this number if you have problems the morning of surgery:  [phone]   Remember:  Do not eat or drink after midnight the night before your surgery      Take these medicines the morning of surgery with A SIP OF WATER  albuterol  (VENTOLIN  HFA)  inhaler  dexamethasone  (DECADRON )  HYDROcodone -acetaminophen  (NORCO)  metoprolol  tartrate (LOPRESSOR )  omeprazole  (PRILOSEC)  pantoprazole  (PROTONIX )    WHAT DO I DO ABOUT MY DIABETES MEDICATION?   Do not take oral diabetes medicines metFORMIN  (GLUCOPHAGE )  (pills) the morning of surgery.   The day of surgery, do not take other diabetes injectables, including Byetta (exenatide), Bydureon (exenatide ER), Victoza (liraglutide), or Trulicity (dulaglutide).  If your CBG is greater than 220 mg/dL, you may take  of your sliding scale (correction) dose of insulin .   HOW TO MANAGE YOUR DIABETES BEFORE AND AFTER SURGERY  Why is it important to control my blood sugar before and after surgery? Improving blood sugar levels before and after surgery helps healing and can limit problems. A way of improving  blood sugar control is eating a healthy diet by:  Eating less sugar and carbohydrates  Increasing activity/exercise  Talking with your doctor about reaching your blood sugar goals High blood sugars (greater than 180 mg/dL) can raise your risk of infections and slow your recovery, so you will need to focus on controlling your diabetes during the weeks before surgery. Make sure that the doctor who takes care of your diabetes knows about your planned surgery including the date and location.  How do I manage my blood sugar before surgery? Check your blood sugar at least 4 times a day, starting 2 days before surgery, to make sure that the level is not too high or low.  Check your blood sugar the morning of your surgery when you wake up and every 2 hours until you get to the Short Stay unit.  If your blood sugar is less than 70 mg/dL, you will need to treat for low blood sugar: Do not take insulin . Treat a low blood sugar (less than 70 mg/dL) with  cup of clear juice (cranberry or apple), 4 glucose tablets, OR glucose gel. Recheck blood sugar in 15 minutes after treatment (to make sure it is greater than 70 mg/dL). If your blood sugar is not greater than 70 mg/dL on recheck, call [phone] for further instructions. Report your blood sugar to the short stay nurse when you get to Short Stay.  If you are admitted to the hospital after surgery: Your blood sugar will be checked by the staff  and you will probably be given insulin  after surgery (instead of oral diabetes medicines) to make sure you have good blood sugar levels. The goal for blood sugar control after surgery is 80-180 mg/dL.     As of today, STOP taking any Aspirin  (unless otherwise instructed by your surgeon) Aleve, Naproxen, Ibuprofen, Motrin, Advil, Goody's, BC's, all herbal medications, fish oil, and all vitamins.                      Do not wear jewelry, make up, or nail polish            Do not wear lotions, powders,  perfumes/colognes, or deodorant.            Do not shave 48 hours prior to surgery.  Men may shave face and neck.            Do not bring valuables to the hospital.            [facility] is not responsible for any belongings or valuables.  Do NOT Smoke (Tobacco/Vaping) 24 hours prior to your procedure If you use a CPAP at night, you may bring all equipment for your overnight stay.   Contacts, glasses, dentures or bridgework may not be worn into surgery.      For patients admitted to the hospital, discharge time will be determined by your treatment team.   Patients discharged the day of surgery will not be allowed to drive home, and someone needs to stay with them for 24 hours.    Special instructions:   [place] [facility]- Preparing For Surgery  Before surgery, you can play an important role. Because skin is not sterile, your skin needs to be as free of germs as possible. You can reduce the number of germs on your skin by washing with CHG (chlorahexidine gluconate) Soap before surgery.  CHG is an antiseptic cleaner which kills germs and bonds with the skin to continue killing germs even after washing.    Oral Hygiene is also important to reduce your risk of infection.  Remember - BRUSH YOUR TEETH THE MORNING OF SURGERY WITH YOUR REGULAR TOOTHPASTE  Please do not use if you have an allergy to CHG or antibacterial soaps. If your skin becomes reddened/irritated stop using the CHG.  Do not shave (including legs and underarms) for at least 48 hours prior to first CHG shower. It is OK to shave your face.  Please follow these instructions carefully.   Shower the NIGHT BEFORE SURGERY and the MORNING OF SURGERY with DIAL Soap.   Pat yourself dry with a CLEAN TOWEL.  Wear CLEAN PAJAMAS to bed the night before surgery  Place CLEAN SHEETS on your bed the night of your first shower and DO NOT SLEEP WITH PETS.   Day of Surgery: Please shower morning of surgery  Wear Clean/Comfortable clothing the  morning of surgery Do not apply any deodorants/lotions.   Remember to brush your teeth WITH YOUR REGULAR TOOTHPASTE.   Questions were answered. Patient verbalized understanding of instructions.

## 2023-12-28 NOTE — Anesthesia Preprocedure Evaluation (Signed)
 Anesthesia Evaluation  Patient identified by MRN, date of birth, ID band Patient awake    Reviewed: Allergy & Precautions, NPO status , Patient's Chart, lab work & pertinent test results, reviewed documented beta blocker date and time   History of Anesthesia Complications Negative for: history of anesthetic complications  Airway Mallampati: III  TM Distance: >3 FB Neck ROM: Full    Dental  (+) Dental Advisory Given, Poor Dentition, Chipped, Missing   Pulmonary COPD,  COPD inhaler, Current SmokerPatient did not abstain from smoking.  Lung cancer    Pulmonary exam normal        Cardiovascular hypertension, Pt. on medications and Pt. on home beta blockers + CAD, + Past MI and + DVT  Normal cardiovascular exam+ dysrhythmias Supra Ventricular Tachycardia    '25 TTE - EF 55 to 60%. Grade I diastolic dysfunction (impaired relaxation). There is moderate hypokinesis of the left ventricular, apical septal wall, anterior wall and inferior wall. There is borderline dilatation of the ascending aorta, measuring 38 mm.     Neuro/Psych  Neuromuscular disease  negative psych ROS   GI/Hepatic Neg liver ROS,GERD  Medicated and Controlled,,  Endo/Other  diabetes, Poorly Controlled, Type 2, Oral Hypoglycemic Agents   Adrenal insufficiency   Renal/GU negative Renal ROS     Musculoskeletal  (+) Arthritis ,    Abdominal   Peds  Hematology  (+) Blood dyscrasia, anemia  Plt 132k    Anesthesia Other Findings   Reproductive/Obstetrics                              Anesthesia Physical Anesthesia Plan  ASA: 3  Anesthesia Plan: General   Post-op Pain Management: Tylenol  PO (pre-op)* and Minimal or no pain anticipated   Induction: Intravenous  PONV Risk Score and Plan: 1 and Treatment may vary due to age or medical condition, Ondansetron  and Dexamethasone   Airway Management Planned: Oral ETT  Additional  Equipment: None  Intra-op Plan:   Post-operative Plan: Extubation in OR  Informed Consent: I have reviewed the patients History and Physical, chart, labs and discussed the procedure including the risks, benefits and alternatives for the proposed anesthesia with the patient or authorized representative who has indicated his/her understanding and acceptance.     Dental advisory given  Plan Discussed with: CRNA and Anesthesiologist  Anesthesia Plan Comments: (PAT note written 12/28/2023 by Costella Schwarz, PA-C.  )         Anesthesia Quick Evaluation

## 2023-12-29 ENCOUNTER — Ambulatory Visit (HOSPITAL_COMMUNITY)

## 2023-12-29 ENCOUNTER — Ambulatory Visit (HOSPITAL_COMMUNITY): Payer: Self-pay | Admitting: Vascular Surgery

## 2023-12-29 ENCOUNTER — Ambulatory Visit (HOSPITAL_COMMUNITY)
Admission: RE | Admit: 2023-12-29 | Discharge: 2023-12-29 | Disposition: A | Discharge status: Home / Self Care | Attending: Emergency Medicine | Admitting: Emergency Medicine

## 2023-12-29 ENCOUNTER — Ambulatory Visit: Admitting: Internal Medicine

## 2023-12-29 ENCOUNTER — Encounter (HOSPITAL_COMMUNITY): Payer: Self-pay | Admitting: Emergency Medicine

## 2023-12-29 ENCOUNTER — Encounter (HOSPITAL_COMMUNITY)
Admission: RE | Disposition: A | Discharge status: Home / Self Care | Payer: Self-pay | Source: Home / Self Care | Attending: Emergency Medicine

## 2023-12-29 DIAGNOSIS — C3432 Malignant neoplasm of lower lobe, left bronchus or lung: Secondary | ICD-10-CM

## 2023-12-29 DIAGNOSIS — E1165 Type 2 diabetes mellitus with hyperglycemia: Secondary | ICD-10-CM | POA: Insufficient documentation

## 2023-12-29 DIAGNOSIS — E1142 Type 2 diabetes mellitus with diabetic polyneuropathy: Secondary | ICD-10-CM | POA: Insufficient documentation

## 2023-12-29 DIAGNOSIS — I10 Essential (primary) hypertension: Secondary | ICD-10-CM | POA: Diagnosis not present

## 2023-12-29 DIAGNOSIS — C3412 Malignant neoplasm of upper lobe, left bronchus or lung: Secondary | ICD-10-CM | POA: Insufficient documentation

## 2023-12-29 DIAGNOSIS — Z48813 Encounter for surgical aftercare following surgery on the respiratory system: Secondary | ICD-10-CM | POA: Diagnosis not present

## 2023-12-29 DIAGNOSIS — I2510 Atherosclerotic heart disease of native coronary artery without angina pectoris: Secondary | ICD-10-CM | POA: Diagnosis not present

## 2023-12-29 DIAGNOSIS — Z85828 Personal history of other malignant neoplasm of skin: Secondary | ICD-10-CM | POA: Diagnosis not present

## 2023-12-29 DIAGNOSIS — R918 Other nonspecific abnormal finding of lung field: Secondary | ICD-10-CM | POA: Diagnosis present

## 2023-12-29 DIAGNOSIS — I119 Hypertensive heart disease without heart failure: Secondary | ICD-10-CM | POA: Diagnosis not present

## 2023-12-29 DIAGNOSIS — F17210 Nicotine dependence, cigarettes, uncomplicated: Secondary | ICD-10-CM | POA: Diagnosis not present

## 2023-12-29 DIAGNOSIS — Z7982 Long term (current) use of aspirin: Secondary | ICD-10-CM | POA: Diagnosis not present

## 2023-12-29 DIAGNOSIS — Z7984 Long term (current) use of oral hypoglycemic drugs: Secondary | ICD-10-CM | POA: Insufficient documentation

## 2023-12-29 DIAGNOSIS — I252 Old myocardial infarction: Secondary | ICD-10-CM | POA: Diagnosis not present

## 2023-12-29 DIAGNOSIS — Z9225 Personal history of immunosupression therapy: Secondary | ICD-10-CM | POA: Insufficient documentation

## 2023-12-29 DIAGNOSIS — Z79899 Other long term (current) drug therapy: Secondary | ICD-10-CM | POA: Diagnosis not present

## 2023-12-29 DIAGNOSIS — K219 Gastro-esophageal reflux disease without esophagitis: Secondary | ICD-10-CM | POA: Diagnosis not present

## 2023-12-29 DIAGNOSIS — I471 Supraventricular tachycardia, unspecified: Secondary | ICD-10-CM | POA: Diagnosis not present

## 2023-12-29 DIAGNOSIS — E2740 Unspecified adrenocortical insufficiency: Secondary | ICD-10-CM | POA: Diagnosis not present

## 2023-12-29 DIAGNOSIS — E785 Hyperlipidemia, unspecified: Secondary | ICD-10-CM | POA: Diagnosis not present

## 2023-12-29 DIAGNOSIS — Z9221 Personal history of antineoplastic chemotherapy: Secondary | ICD-10-CM | POA: Insufficient documentation

## 2023-12-29 DIAGNOSIS — Z86718 Personal history of other venous thrombosis and embolism: Secondary | ICD-10-CM | POA: Insufficient documentation

## 2023-12-29 DIAGNOSIS — C3482 Malignant neoplasm of overlapping sites of left bronchus and lung: Secondary | ICD-10-CM | POA: Diagnosis not present

## 2023-12-29 DIAGNOSIS — Z7952 Long term (current) use of systemic steroids: Secondary | ICD-10-CM | POA: Diagnosis not present

## 2023-12-29 DIAGNOSIS — C3490 Malignant neoplasm of unspecified part of unspecified bronchus or lung: Secondary | ICD-10-CM

## 2023-12-29 HISTORY — DX: Unspecified osteoarthritis, unspecified site: M19.90

## 2023-12-29 HISTORY — DX: Unspecified adrenocortical insufficiency: E27.40

## 2023-12-29 HISTORY — PX: VIDEO BRONCHOSCOPY WITH ENDOBRONCHIAL NAVIGATION: SHX6175

## 2023-12-29 LAB — BASIC METABOLIC PANEL WITH GFR
Anion gap: 11 (ref 5–15)
BUN: 18 mg/dL (ref 8–23)
CO2: 21 mmol/L — ABNORMAL LOW (ref 22–32)
Calcium: 8.6 mg/dL — ABNORMAL LOW (ref 8.9–10.3)
Chloride: 102 mmol/L (ref 98–111)
Creatinine, Ser: 1.11 mg/dL (ref 0.61–1.24)
GFR, Estimated: >60 mL/min (ref >60)
Glucose, Bld: 310 mg/dL — ABNORMAL HIGH (ref 70–99)
Potassium: 3.8 mmol/L (ref 3.5–5.1)
Sodium: 134 mmol/L — ABNORMAL LOW (ref 135–145)

## 2023-12-29 LAB — CBC
HCT: 36.6 % — ABNORMAL LOW (ref 39.0–52.0)
Hemoglobin: 12.2 g/dL — ABNORMAL LOW (ref 13.0–17.0)
MCH: 30.2 pg (ref 26.0–34.0)
MCHC: 33.3 g/dL (ref 30.0–36.0)
MCV: 90.6 fL (ref 80.0–100.0)
Platelets: 132 K/uL — ABNORMAL LOW (ref 150–400)
RBC: 4.04 MIL/uL — ABNORMAL LOW (ref 4.22–5.81)
RDW: 13.2 % (ref 11.5–15.5)
WBC: 7.2 K/uL (ref 4.0–10.5)
nRBC: 0 % (ref 0.0–0.2)

## 2023-12-29 LAB — GLUCOSE, CAPILLARY
Glucose-Capillary: 317 mg/dL — ABNORMAL HIGH (ref 70–99)
Glucose-Capillary: 248 mg/dL — ABNORMAL HIGH (ref 70–99)
Glucose-Capillary: 238 mg/dL — ABNORMAL HIGH (ref 70–99)

## 2023-12-29 SURGERY — VIDEO BRONCHOSCOPY WITH ENDOBRONCHIAL NAVIGATION
Anesthesia: General | Laterality: Left

## 2023-12-29 MED ORDER — EPHEDRINE SULFATE-NACL 50-0.9 MG/10ML-% IV SOSY
PREFILLED_SYRINGE | INTRAVENOUS | Status: DC | PRN
  Administered 2023-12-29: 10 mg via INTRAVENOUS

## 2023-12-29 MED ORDER — PROPOFOL 10 MG/ML IV BOLUS
INTRAVENOUS | Status: DC | PRN
Start: 2023-12-29 — End: 2023-12-29
  Administered 2023-12-29: 100 mg via INTRAVENOUS

## 2023-12-29 MED ORDER — ONDANSETRON HCL 4 MG/2ML IJ SOLN
INTRAMUSCULAR | Status: DC | PRN
  Administered 2023-12-29: 4 mg via INTRAVENOUS

## 2023-12-29 MED ORDER — ROCURONIUM BROMIDE 10 MG/ML (PF) SYRINGE
PREFILLED_SYRINGE | INTRAVENOUS | Status: DC | PRN
  Administered 2023-12-29: 70 mg via INTRAVENOUS

## 2023-12-29 MED ORDER — ONDANSETRON HCL 4 MG/2ML IJ SOLN: 4.0000 mg | Freq: Once | INTRAMUSCULAR | Status: DC | PRN

## 2023-12-29 MED ORDER — OXYCODONE HCL 5 MG/5ML PO SOLN: 5.0000 mg | Freq: Once | ORAL | Status: DC | PRN

## 2023-12-29 MED ORDER — OXYCODONE HCL 5 MG PO TABS: 5.0000 mg | ORAL_TABLET | Freq: Once | ORAL | Status: DC | PRN

## 2023-12-29 MED ORDER — INSULIN ASPART 100 UNIT/ML IJ SOLN
0.0000 [IU] | INTRAMUSCULAR | Status: DC | PRN
  Administered 2023-12-29: 5 [IU] via SUBCUTANEOUS
  Filled 2023-12-29: qty 1
  Filled 2023-12-29: qty 0.07

## 2023-12-29 MED ORDER — DEXAMETHASONE SOD PHOSPHATE PF 10 MG/ML IJ SOLN
INTRAMUSCULAR | Status: DC | PRN
  Administered 2023-12-29: 4 mg via INTRAVENOUS

## 2023-12-29 MED ORDER — FENTANYL CITRATE (PF) 100 MCG/2ML IJ SOLN: 25.0000 ug | INTRAMUSCULAR | Status: DC | PRN

## 2023-12-29 MED ORDER — ACETAMINOPHEN 500 MG PO TABS
1000.0000 mg | ORAL_TABLET | Freq: Once | ORAL | Status: AC
  Administered 2023-12-29: 1000 mg via ORAL
  Filled 2023-12-29: qty 2

## 2023-12-29 MED ORDER — SUGAMMADEX SODIUM 200 MG/2ML IV SOLN
INTRAVENOUS | Status: DC | PRN
  Administered 2023-12-29: 200 mg via INTRAVENOUS

## 2023-12-29 MED ORDER — LACTATED RINGERS IV SOLN
INTRAVENOUS | Status: DC
Start: 2023-12-29 — End: 2023-12-29

## 2023-12-29 SURGICAL SUPPLY — 36 items
ADAPTER BRONCHOSCOPE OLYMPUS (ADAPTER) ×1 IMPLANT
ADAPTER VALVE BIOPSY EBUS (MISCELLANEOUS) IMPLANT
BAG COUNTER SPONGE SURGICOUNT (BAG) ×1 IMPLANT
BRUSH CYTOL CELLEBRITY 1.5X140 (MISCELLANEOUS) ×1 IMPLANT
BRUSH SUPERTRAX BIOPSY (INSTRUMENTS) IMPLANT
BRUSH SUPERTRAX NDL-TIP CYTO (INSTRUMENTS) ×1 IMPLANT
CANISTER SUCTION 3000ML PPV (SUCTIONS) ×1 IMPLANT
CNTNR URN SCR LID CUP LEK RST (MISCELLANEOUS) ×1 IMPLANT
COVER BACK TABLE 60X90IN (DRAPES) ×1 IMPLANT
FILTER STRAW FLUID ASPIR (MISCELLANEOUS) IMPLANT
FORCEPS BIOP 1.5 SINGLE USE (MISCELLANEOUS) ×1 IMPLANT
FORCEPS BIOP SUPERTRX PREMAR (INSTRUMENTS) ×1 IMPLANT
GAUZE SPONGE 4X4 12PLY STRL (GAUZE/BANDAGES/DRESSINGS) ×1 IMPLANT
GLOVE BIO SURGEON STRL SZ7.5 (GLOVE) ×2 IMPLANT
GOWN STRL REUS W/ TWL LRG LVL3 (GOWN DISPOSABLE) ×2 IMPLANT
KIT CLEAN ENDO COMPLIANCE (KITS) ×1 IMPLANT
KIT LOCATABLE GUIDE (CANNULA) IMPLANT
KIT MARKER FIDUCIAL DELIVERY (KITS) IMPLANT
KIT TURNOVER KIT B (KITS) ×1 IMPLANT
MARKER SKIN DUAL TIP RULER LAB (MISCELLANEOUS) ×1 IMPLANT
NDL SUPERTRX PREMARK BIOPSY (NEEDLE) ×1 IMPLANT
NEEDLE SUPERTRX PREMARK BIOPSY (NEEDLE) ×1 IMPLANT
OIL SILICONE PENTAX (PARTS (SERVICE/REPAIRS)) ×1 IMPLANT
PAD ARMBOARD POSITIONER FOAM (MISCELLANEOUS) ×2 IMPLANT
PATCHES PATIENT (LABEL) ×3 IMPLANT
SOLN 0.9% NACL POUR BTL 1000ML (IV SOLUTION) ×1 IMPLANT
SOLN STERILE WATER BTL 1000 ML (IV SOLUTION) ×1 IMPLANT
SYR 20ML ECCENTRIC (SYRINGE) ×1 IMPLANT
SYR 20ML LL LF (SYRINGE) ×1 IMPLANT
SYR 50ML SLIP (SYRINGE) ×1 IMPLANT
TOWEL GREEN STERILE FF (TOWEL DISPOSABLE) ×1 IMPLANT
TRAP SPECIMEN MUCUS 40CC (MISCELLANEOUS) IMPLANT
TUBE CONNECTING 20X1/4 (TUBING) ×1 IMPLANT
UNDERPAD 30X36 HEAVY ABSORB (UNDERPADS AND DIAPERS) ×1 IMPLANT
VALVE BIOPSY SINGLE USE (MISCELLANEOUS) ×1 IMPLANT
VALVE SUCTION BRONCHIO DISP (MISCELLANEOUS) ×1 IMPLANT

## 2023-12-29 NOTE — Anesthesia Procedure Notes (Signed)
 Procedure Name: Intubation Date/Time: 12/29/2023 9:29 AM  Performed by: Lockie Flesher, CRNAPre-anesthesia Checklist: Patient identified, Emergency Drugs available, Suction available and Patient being monitored Patient Re-evaluated:Patient Re-evaluated prior to induction Oxygen Delivery Method: Circle System Utilized Preoxygenation: Pre-oxygenation with 100% oxygen Induction Type: IV induction Ventilation: Mask ventilation without difficulty Laryngoscope Size: Mac and 3 Grade View: Grade II Tube type: Oral Tube size: 8.0 mm Number of attempts: 1 Airway Equipment and Method: Stylet and Oral airway Placement Confirmation: ETT inserted through vocal cords under direct vision, positive ETCO2 and breath sounds checked- equal and bilateral Secured at: 24 cm Tube secured with: Tape Dental Injury: Teeth and Oropharynx as per pre-operative assessment

## 2023-12-29 NOTE — Interval H&P Note (Signed)
 History and Physical Interval Note:  12/29/2023 9:16 AM  Brandon Rubio.  has presented today for surgery, with the diagnosis of Left upper lobe and lower lobe masses.  The various methods of treatment have been discussed with the patient and family. After consideration of risks, benefits and other options for treatment, the patient has consented to  Procedure(s): VIDEO BRONCHOSCOPY WITH ENDOBRONCHIAL NAVIGATION (Left) as a surgical intervention.  The patient's history has been reviewed, patient examined, no change in status, stable for surgery.  I have reviewed the patient's chart and labs.  Questions were answered to the patient's satisfaction.     Lamar GORMAN Chris

## 2023-12-29 NOTE — Discharge Instructions (Addendum)

## 2023-12-29 NOTE — Op Note (Signed)
 Video Bronchoscopy with Robotic Assisted Bronchoscopic Navigation   Date of Operation: 12/29/2023   Pre-op Diagnosis: Left lower lobe ground glass mass, left upper lobe ground glass nodular opacity  Post-op Diagnosis: Same  Surgeon: Lamar Chris  Assistants: None  Anesthesia: General endotracheal anesthesia  Operation: Flexible video fiberoptic bronchoscopy with robotic assistance and biopsies.  Estimated Blood Loss: Minimal  Complications: None  Indications and History: Brandon Shells. is a 73 y.o. male with history of tobacco use, adenocarcinoma of the right lung.  Found to have left lower lobe and left upper lobe ground glass opacities.  Recommendation made to achieve a tissue diagnosis via robotic assisted navigational bronchoscopy.  The risks, benefits, complications, treatment options and expected outcomes were discussed with the patient.  The possibilities of pneumothorax, pneumonia, reaction to medication, pulmonary aspiration, perforation of a viscus, bleeding, failure to diagnose a condition and creating a complication requiring transfusion or operation were discussed with the patient who freely signed the consent.    Description of Procedure: The patient was seen in the Preoperative Area, was examined and was deemed appropriate to proceed.  The patient was taken to [facility] Endoscopy room 3, identified as Brandon Rubio. and the procedure verified as Flexible Video Fiberoptic Bronchoscopy.  A Time Out was held and the above information confirmed.   Prior to the date of the procedure a high-resolution CT scan of the chest was performed. Utilizing ION software program a virtual tracheobronchial tree was generated to allow the creation of distinct navigation pathways to the patient's parenchymal abnormalities. After being taken to the operating room general anesthesia was initiated and the patient  was orally intubated. The video fiberoptic bronchoscope was introduced via the  endotracheal tube and a general inspection was performed which showed normal right and left lung anatomy. Aspiration of the bilateral mainstems was completed to remove any remaining secretions. Robotic catheter inserted into patient's endotracheal tube.   Target #1 left lower lobe ground glass mass: The distinct navigation pathways prepared prior to this procedure were then utilized to navigate to patient's lesion identified on CT scan. The robotic catheter was secured into place and the vision probe was withdrawn.  Lesion location was approximated using fluoroscopy.  Local registration and targeting was performed using [employer] Cios mobile C-arm three-dimensional imaging. Under fluoroscopic guidance transbronchial needle biopsies and transbronchial cryoprobe biopsies were performed to be sent for cytology and pathology.  Needle-in-lesion was confirmed using Cios mobile C-arm.    Target #2 left upper lobe ground glass nodule: The distinct navigation pathways prepared prior to this procedure were then utilized to navigate to patient's lesion identified on CT scan. The robotic catheter was secured into place and the vision probe was withdrawn.  Lesion location was approximated using fluoroscopy.  Local registration and targeting was performed using [employer] Cios mobile C-arm three-dimensional imaging. Under fluoroscopic guidance transbronchial needle brushings, transbronchial needle biopsies, and transbronchial cryoprobe biopsies were performed to be sent for cytology and pathology.    At the end of the procedure a general airway inspection was performed and there was no evidence of active bleeding. The bronchoscope was removed.  The patient tolerated the procedure well. There was no significant blood loss and there were no obvious complications. A post-procedural chest x-ray is pending.  Samples Target #1: 1. Transbronchial needle brushings from left upper lobe ground glass mass 2.  Transbronchial Wang needle biopsies from left upper lobe ground glass mass 3. Transbronchial cryoprobe biopsies from left upper lobe ground glass  mass  Samples Target #2: 1. Transbronchial needle brushings from left upper lobe ground glass nodule 2. Transbronchial Wang needle biopsies from left upper lobe ground glass nodule 3. Transbronchial cryoprobe biopsies from left upper lobe ground glass nodule   Plans:  The patient will be discharged from the PACU to home when recovered from anesthesia and after chest x-ray is reviewed. We will review the cytology, pathology and microbiology results with the patient when they become available. Outpatient followup will be with Dr. Shelah and CANDIE Lites, NP.    Lamar Shelah, MD, PhD 12/29/2023, 10:21 AM [facility] [phone] or if no answer before 7:00PM call [phone] For any issues after 7:00PM please call eLink [phone]

## 2023-12-29 NOTE — Anesthesia Postprocedure Evaluation (Signed)
 Anesthesia Post Note  Patient: Brandon Rubio.  Procedure(s) Performed: VIDEO BRONCHOSCOPY WITH ENDOBRONCHIAL NAVIGATION (Left)     Patient location during evaluation: PACU Anesthesia Type: General Level of consciousness: awake and alert Pain management: pain level controlled Vital Signs Assessment: post-procedure vital signs reviewed and stable Respiratory status: spontaneous breathing, nonlabored ventilation and respiratory function stable Cardiovascular status: stable and blood pressure returned to baseline Anesthetic complications: no   No notable events documented.  Last Vitals:  Vitals:   12/29/23 1030 12/29/23 1045  BP: (!) 123/59 114/73  Pulse: 70 75  Resp: 16 16  Temp:    SpO2: 93% 95%    Last Pain:  Vitals:   12/29/23 1021  TempSrc:   PainSc: 0-No pain                 Debby FORBES Like

## 2023-12-29 NOTE — Transfer of Care (Signed)
 Immediate Anesthesia Transfer of Care Note  Patient: Brandon Rubio.  Procedure(s) Performed: VIDEO BRONCHOSCOPY WITH ENDOBRONCHIAL NAVIGATION (Left)  Patient Location: PACU  Anesthesia Type:General  Level of Consciousness: awake, alert , and oriented  Airway & Oxygen Therapy: Patient Spontanous Breathing and Patient connected to nasal cannula oxygen  Post-op Assessment: Report given to RN and Post -op Vital signs reviewed and stable  Post vital signs: Reviewed and stable  Last Vitals:  Vitals Value Taken Time  BP 124/70 12/29/23 10:21  Temp    Pulse 79 12/29/23 10:23  Resp 15 12/29/23 10:23  SpO2 95 % 12/29/23 10:23  Vitals shown include unfiled device data.  Last Pain:  Vitals:   12/29/23 0746  TempSrc:   PainSc: 0-No pain         Complications: No notable events documented.

## 2023-12-30 ENCOUNTER — Encounter (HOSPITAL_COMMUNITY): Payer: Self-pay | Admitting: Emergency Medicine

## 2023-12-30 LAB — CYTOLOGY - NON PAP

## 2023-12-31 ENCOUNTER — Other Ambulatory Visit: Payer: Self-pay | Admitting: Internal Medicine

## 2023-12-31 NOTE — Telephone Encounter (Signed)
 MEDICATION: Dexamethasone  (DECADRON ) 0.75 MG tablet   PHARMACY:   [id number] [place], [place] - [address] AT [facility] [address] [facility] [address] [place] [address] Phone: [phone]  Fax: [phone]    HAS THE PATIENT CONTACTED THEIR PHARMACY?  Yes  LAST REFILL:  @@LASTREFILL @  IS THIS A 90 DAY SUPPLY : Yes  IS PATIENT OUT OF MEDICATION:   IF NOT; HOW MUCH IS LEFT: Patient did not say the amount  LAST APPOINTMENT DATE: @5 /07/2023  NEXT APPOINTMENT DATE:@Visit  date not found  DO WE HAVE YOUR PERMISSION TO LEAVE A DETAILED MESSAGE?: Yes  OTHER COMMENTS: Patient will call back to schedule follow up,he is having some procedures done and will call to schedule.   **Let patient know to contact pharmacy at the end of the day to make sure medication is ready. **  ** Please notify patient to allow 48-72 hours to process**  **Encourage patient to contact the pharmacy for refills or they can request refills through [facility]**

## 2023-12-31 NOTE — Telephone Encounter (Signed)
Please come back for a follow-up appointment in 6 months.   

## 2024-01-04 ENCOUNTER — Encounter: Payer: Self-pay | Admitting: Acute Care

## 2024-01-04 ENCOUNTER — Ambulatory Visit (INDEPENDENT_AMBULATORY_CARE_PROVIDER_SITE_OTHER): Admitting: Acute Care

## 2024-01-04 VITALS — BP 104/73 | HR 88 | Temp 98.1°F | Ht 72.0 in | Wt 202.0 lb

## 2024-01-04 DIAGNOSIS — Z9889 Other specified postprocedural states: Secondary | ICD-10-CM

## 2024-01-04 DIAGNOSIS — F17210 Nicotine dependence, cigarettes, uncomplicated: Secondary | ICD-10-CM

## 2024-01-04 DIAGNOSIS — C3490 Malignant neoplasm of unspecified part of unspecified bronchus or lung: Secondary | ICD-10-CM

## 2024-01-04 DIAGNOSIS — F17200 Nicotine dependence, unspecified, uncomplicated: Secondary | ICD-10-CM

## 2024-01-04 DIAGNOSIS — R911 Solitary pulmonary nodule: Secondary | ICD-10-CM

## 2024-01-04 DIAGNOSIS — C3492 Malignant neoplasm of unspecified part of left bronchus or lung: Secondary | ICD-10-CM

## 2024-01-04 DIAGNOSIS — R9389 Abnormal findings on diagnostic imaging of other specified body structures: Secondary | ICD-10-CM

## 2024-01-04 NOTE — Progress Notes (Signed)
 History of Present Illness Brandon Rubio. is a 73 y.o. male current every day smoker seen by Dr. Shelah in 2022 for heavy tobacco use, spontaneous pneumothorax (remote), stage III A adenocarcinoma .Treated with second line chemotherapy by Dr. Sherrod but this was held in 03/2023 due to toxicity and a hospitalization. He was referred again to be evaluated for metastatic disease after CT chest abdomen pelvis 10/21/2023 showed a large ground glass mass in the left lower lobe 6.4 x 5.7 cm (larger than April 2025), multiple left upper lobe ground glass nodules again noted (increased) .    01/04/2024 Discussed the use of AI scribe software for clinical note transcription with the patient, who gave verbal consent to proceed.  History of Present Illness Brandon Rubio. is a 73 year old male with history of  adenocarcinoma who presents for follow-up regarding his lung cancer treatment.  He has a history of adenocarcinoma, initially diagnosed in October 2022, treated with chemotherapy and radiation. The initial treatment included Keytruda  and another unspecified medication, resulting in significant side effects such as hair loss. Radiation treatment affected his adrenal gland, necessitating lifelong steroid use.  He underwent a robotic procedure on his right lung  in 2022 and more recently had repeat bronchoscopy with biopsies to the  the left lower and upper lobes to evaluate for metastatic disease on 12/29/2023. CT chest done as surveillance 10/2023 was concerning for progression of disease. Both recent biopsies done 12/29/2023 were positive for adenocarcinoma. He states he did well after the procedure. No significant bleeding, no fever, worsening shortness of breath from his baseline or adverse reaction to the anesthesia.   I discussed treatment options moving forward. He refused an MR brain as he feels he has had too many scans. He also preferred to wait and talk with Dr. Sherrod regarding any  future radiation treatments. He has an appointment scheduled or 11/18.    Pt. Has concerns about treatment. Chemotherapy was discontinued approximately four months ago following hospitalization due to complications suspected to be pneumonia. He is concerned about the number of medications prescribed by different doctors, feeling they might be 'colliding'.  He continues to smoke and has been doing so for nearly 65 years, stating he does not wish to quit. He acknowledges the potential benefits of quitting but feels it may not significantly impact his current condition. He has been provided with smoking cessation resources, however, I do not believe he is planning on quitting at present time.        Test Results: Cytology 12/29/2023 A. LUNG, LLL, FINE NEEDLE ASPIRATION:  Adenocarcinoma  See comment   B. LUNG, LUL, FINE NEEDLE ASPIRATION:  Adenocarcinoma   C. LUNG, LUL, BRUSHING:  Adenocarcinoma   COMMENT:  There is sufficient cellularity in the cellblock from part A for  molecular testing.   Super D CT Chest 12/24/2023 No pathologically enlarged mediastinal or axillary lymph nodes. Hilar regions are difficult to definitively evaluate without IV contrast. Esophagus is grossly unremarkable.   Lungs/Pleura: Centrilobular and paraseptal emphysema. Post radiation parenchymal retraction, bronchiectasis and volume loss in the perihilar right lung and right lower lobe. Bilateral ground-glass, mixed attenuation and solid nodules and masses, unchanged in the relatively short interval from 10/21/2023 but clearly progressive from 10/29/2022. Index conglomerate mixed attenuation mass in the left lower lobe measures 5.7 x 8.8 cm (7/82), previously seen as separate masses measuring up to 1.4 x 3.8 cm on 10/29/2022. Small right fibrothorax. Airway is unremarkable.  Bilateral multifocal adenocarcinomas, stable  in the short interval from 10/21/2023 but clearly progressive from 10/29/2022. 2.  Post radiation scarring in the right hemithorax. 3. Small right fibrothorax. 4. Aortic atherosclerosis (ICD10-I70.0). Coronary artery calcification. 5.  Emphysema (ICD10-J43.9).      Latest Ref Rng & Units 12/29/2023    8:06 AM 10/21/2023   10:35 AM 07/22/2023    8:42 AM  CBC  WBC 4.0 - 10.5 K/uL 7.2  8.5  11.5   Hemoglobin 13.0 - 17.0 g/dL 87.7  87.9  88.7   Hematocrit 39.0 - 52.0 % 36.6  35.7  33.6   Platelets 150 - 400 K/uL 132  144  249        Latest Ref Rng & Units 12/29/2023    8:06 AM 10/21/2023   10:35 AM 07/22/2023    8:42 AM  BMP  Glucose 70 - 99 mg/dL 689  683  855   BUN 8 - 23 mg/dL 18  16  13    Creatinine 0.61 - 1.24 mg/dL 8.88  8.86  8.98   Sodium 135 - 145 mmol/L 134  136  137   Potassium 3.5 - 5.1 mmol/L 3.8  4.0  3.8   Chloride 98 - 111 mmol/L 102  102  105   CO2 22 - 32 mmol/L 21  25  24    Calcium  8.9 - 10.3 mg/dL 8.6  8.8  8.6     BNP    Component Value Date/Time   BNP 37.6 10/02/2022 1640    ProBNP No results found for: PROBNP  PFT    Component Value Date/Time   FEV1PRE 3.11 11/15/2020 1034   FEV1POST 3.14 11/15/2020 1034   FVCPRE 4.58 11/15/2020 1034   FVCPOST 4.59 11/15/2020 1034   TLC 6.19 11/15/2020 1034   DLCOUNC 21.62 11/15/2020 1034   PREFEV1FVCRT 68 11/15/2020 1034   PSTFEV1FVCRT 68 11/15/2020 1034    DG Chest Port 1 View Result Date: 12/29/2023 EXAM: 1 VIEW(S) XRAY OF THE CHEST 12/29/2023 11:02:51 AM COMPARISON: 07/05/2023 and CT of 12/24/2023. CLINICAL HISTORY: S/P bronchoscopy with biopsy [id number]. S/p bronch, left lung biopsy; rover. FINDINGS: LUNGS AND PLEURA: Increasing opacity in left lower lung, corresponding to known mass. Stable right mid lung scarring. No pulmonary edema. No pleural effusion. No pneumothorax. HEART AND MEDIASTINUM: Reverse apical aortic positioning. BONES AND SOFT TISSUES: No acute osseous abnormality. IMPRESSION: 1. No pneumothorax or other acute complication after bronchoscopy. 2. Left lower lobe lung  mass, as before. Electronically signed by: Rockey Kilts MD 12/29/2023 12:30 PM EDT RP Workstation: [id number]   DG C-ARM BRONCHOSCOPY Result Date: 12/29/2023 C-ARM BRONCHOSCOPY: Fluoroscopy was utilized by the requesting physician.  No radiographic interpretation.   CT SUPER D CHEST WO CONTRAST Result Date: 12/28/2023 CLINICAL DATA:  Lung cancer.  * Tracking Code: BO * EXAM: CT CHEST WITHOUT CONTRAST TECHNIQUE: Multidetector CT imaging of the chest was performed using thin slice collimation for electromagnetic bronchoscopy planning purposes, without intravenous contrast. RADIATION DOSE REDUCTION: This exam was performed according to the departmental dose-optimization program which includes automated exposure control, adjustment of the mA and/or kV according to patient size and/or use of iterative reconstruction technique. COMPARISON:  10/21/2023. FINDINGS: Cardiovascular: Atherosclerotic calcification of the aorta and coronary arteries. Heart size normal. No pericardial effusion. Mediastinum/Nodes: No pathologically enlarged mediastinal or axillary lymph nodes. Hilar regions are difficult to definitively evaluate without IV contrast. Esophagus is grossly unremarkable. Lungs/Pleura: Centrilobular and paraseptal emphysema. Post radiation parenchymal retraction, bronchiectasis and volume loss in the perihilar right lung and right lower lobe.  Bilateral ground-glass, mixed attenuation and solid nodules and masses, unchanged in the relatively short interval from 10/21/2023 but clearly progressive from 10/29/2022. Index conglomerate mixed attenuation mass in the left lower lobe measures 5.7 x 8.8 cm (7/82), previously seen as separate masses measuring up to 1.4 x 3.8 cm on 10/29/2022. Small right fibrothorax. Airway is unremarkable. Upper Abdomen: Low-attenuation lesion in the left kidney. No specific follow-up necessary. Visualized portions of the liver, gallbladder, adrenal glands, kidneys, spleen, pancreas,  stomach and bowel are otherwise grossly unremarkable. No upper abdominal adenopathy. Musculoskeletal: Degenerative changes in the spine. No worrisome lytic or sclerotic lesions. IMPRESSION: 1. Bilateral multifocal adenocarcinomas, stable in the short interval from 10/21/2023 but clearly progressive from 10/29/2022. 2. Post radiation scarring in the right hemithorax. 3. Small right fibrothorax. 4. Aortic atherosclerosis (ICD10-I70.0). Coronary artery calcification. 5.  Emphysema (ICD10-J43.9). Electronically Signed   By: Newell Eke M.D.   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 He started smoking about 63 years ago. He has a 95.7 pack-year smoking history. He has never used smokeless tobacco. He reports that he does not drink alcohol and does not use drugs.  Tobacco Cessation: Ready to quit: Not Answered Counseling given: Not Answered Tobacco  comments: 1pk daily, 01/04/24 Marcus HERO CMA Current every day smoker   Past surgical hx, Family hx, Social hx all reviewed.  Current Outpatient Medications on File Prior to Visit  Medication Sig   aspirin  EC 81 MG tablet Take 81 mg by mouth in the morning. Swallow whole.   dexamethasone  (DECADRON ) 0.75 MG tablet TAKE 1 TABLET(0.75 MG) BY MOUTH DAILY   HYDROcodone -acetaminophen  (NORCO) 10-325 MG tablet Take 1 tablet by mouth every 8 (eight) hours as needed.   metFORMIN  (GLUCOPHAGE ) 1000 MG tablet TAKE 1 TABLET(1000 MG) BY MOUTH TWICE DAILY WITH A MEAL   metoprolol  tartrate (LOPRESSOR ) 25 MG tablet Take 1 tablet (25 mg total) by mouth 2 (two) times daily.   omeprazole  (PRILOSEC) 20 MG capsule TAKE 1 CAPSULE BY MOUTH TWICE DAILY BEFORE A MEAL   pantoprazole  (PROTONIX ) 40 MG tablet Take 1 tablet (40 mg total) by mouth daily.   simvastatin  (ZOCOR ) 40 MG tablet TAKE 1  TABLET BY MOUTH EVERY EVENING AT 6 PM   No current facility-administered medications on file prior to visit.     Allergies  Allergen Reactions   Penicillins Anaphylaxis, Swelling and Other (See Comments)    THE THROAT SWELLS!!!! Because of a history of documented adverse serious drug reaction;[name] Alert bracelet  is recommended   Albumin  (Human) Hives and Itching   Cyramza  [Ramucirumab ] Itching    Review Of Systems:  Constitutional:   No  weight loss, night sweats,  Fevers, chills,+  fatigue, or  lassitude.  HEENT:   No headaches,  Difficulty swallowing,  Tooth/dental problems, or  Sore throat,                No sneezing, itching, ear ache, nasal congestion, post nasal drip,   CV:  No chest pain,  Orthopnea, PND, swelling in lower extremities, anasarca, dizziness, palpitations, syncope.   GI  No heartburn, indigestion, abdominal pain, nausea, vomiting, diarrhea, change in bowel habits, loss of appetite, bloody stools.   Resp: +baseline  shortness of breath with exertion less at rest.  + excess mucus, no productive cough,   No non-productive cough,  No coughing up of blood.  No change in color of mucus.  No wheezing.  No chest wall deformity  Skin: no rash or lesions.  GU: no dysuria, change in color of urine, no urgency or frequency.  No flank pain, no hematuria   MS:  No joint pain or swelling.  + decreased range of motion.  No back pain.  Psych:  No change in mood or affect. No depression or anxiety.  No memory loss.   Vital Signs BP 104/73   Pulse 88   Temp 98.1 F (36.7 C)   Ht 6' (1.829 m)   Wt 202 lb (91.6 kg)   SpO2 92%   BMI 27.40 kg/m    Physical Exam GENERAL: No distress, alert and oriented times 3. EARS NOSE THROAT: No sinus tenderness, tympanic membranes clear, pale nasal mucosa, no oral exudate, no post nasal drip, no lymphadenopathy. CHEST: Breath sounds present bilaterally. No wheeze, rales, dullness, no accessory muscle use, no nasal flaring, no sternal retractions. Few rhonchi that clear with cough CARDIAC: S1, S2, regular rate and rhythm, no murmur. ABDOMINAL: Soft, non tender. ND, BS present,Body mass index is 27.4 kg/m.  EXTREMITIES: No clubbing, cyanosis, edema. No obvious deformities. NEUROLOGICAL: Normal strength. Alert and oriented x 3, MAE x 4. SKIN: No rashes, warm and dry. No obvious skin lesions. PSYCHIATRIC: Normal mood and behavior.   Assessment/Plan  Assessment & Plan Post bronchoscopy with biopsies 12/29/2023 History of stage III A adenocarcinoma , treated with chemo and radiation 2022 Metastatic / progressive non-small cell lung cancer (adenocarcinoma) LLL and LUL Previous treatments stopped due to hospitalization and cardiac issues.  - Send biopsy results to Dr. Lenward message sent through Duwaine Salmon, RN Navigator - Follow up with Dr. Sherrod  on November 18 as is scheduled. - Undergo CT chest, abdomen, and pelvis before follow-up 11/18. - Pt. Refused MR Brain and referral to radiation oncology   Tobacco use disorder Chronic tobacco use for  nearly 65 years. No desire to quit despite understanding benefits. Plan Encouraged to quit smoking, resources provided Counseled x 3 minutes today  I spent 25 minutes dedicated to the care of this patient on the date of this encounter to include pre-visit review of records, face-to-face time with the patient discussing conditions above, post visit ordering of testing, clinical documentation with the  electronic health record, making appropriate referrals as documented, and communicating necessary information to the patient's healthcare team.     Lauraine JULIANNA Lites, NP 01/04/2024  10:48 AM

## 2024-01-04 NOTE — Patient Instructions (Addendum)
 It is good to see you today. I am glad you did well after the bronchoscopy and biopsies.  We have reviewed your results. The left upper lobe and left lower lobe were both positive for adenocarcinoma, which is the same cancer you were diagnosed with in 2022.  You have follow up with Dr. Sherrod 01/26/2024. You prefer that I don't order an MR Brain, so we will not order that today. You prefer to wait until Dr. Sherrod sees you before we refer you to radiation.  Call if you need us  . Please consider  quitting smoking. I know it is hard.  It does improve your treatment response. You can receive free nicotine  replacement therapy (patches, gum, or mints) by calling 1-800-QUIT NOW. Please call so we can get you on the path to becoming a non-smoker. I know it is hard, but you can do this!  Hypnosis for smoking cessation  [employer] [phone]  Acupuncture for smoking cessation  [employer] [phone]

## 2024-01-07 ENCOUNTER — Other Ambulatory Visit: Payer: Self-pay

## 2024-01-07 NOTE — Progress Notes (Signed)
 The proposed treatment discussed in conference is for discussion purpose only and is not a binding recommendation.  The patients have not been physically examined, or presented with their treatment options.  Therefore, final treatment plans cannot be decided.

## 2024-01-08 NOTE — Progress Notes (Signed)
 Request for pts tissue from recent biopsy be sent to [facility] for MET testing emailed to Cydney Frazier and Tonya Pendell, [facility] path technicians.

## 2024-01-15 ENCOUNTER — Other Ambulatory Visit: Payer: Self-pay | Admitting: Internal Medicine

## 2024-01-15 NOTE — Telephone Encounter (Signed)
 Copied from CRM [id number]. Topic: Clinical - Medication Refill >> Jan 15, 2024  8:41 AM Rosina BIRCH wrote: Medication: HYDROcodone -acetaminophen   Has the patient contacted their pharmacy? No (Agent: If no, request that the patient contact the pharmacy for the refill. If patient does not wish to contact the pharmacy document the reason why and proceed with request.) (Agent: If yes, when and what did the pharmacy advise?)  This is the patient's preferred pharmacy:  [id number] [place], [place] - [address] AT [facility] OF [address] & [facility] [address]  [place] [address] Phone: [phone] Fax: [phone]  Is this the correct pharmacy for this prescription? Yes If no, delete pharmacy and type the correct one.   Has the prescription been filled recently? Yes  Is the patient out of the medication? No  Has the patient been seen for an appointment in the last year OR does the patient have an upcoming appointment? Yes  Can we respond through MyChart? Yes  Agent: Please be advised that Rx refills may take up to 3 business days. We ask that you follow-up with your pharmacy.

## 2024-01-19 ENCOUNTER — Inpatient Hospital Stay: Attending: Internal Medicine

## 2024-01-19 ENCOUNTER — Other Ambulatory Visit

## 2024-01-19 ENCOUNTER — Telehealth: Payer: Self-pay

## 2024-01-19 ENCOUNTER — Ambulatory Visit (HOSPITAL_COMMUNITY)
Admission: RE | Admit: 2024-01-19 | Discharge: 2024-01-19 | Disposition: A | Discharge status: Home / Self Care | Source: Ambulatory Visit | Attending: Internal Medicine | Admitting: Internal Medicine

## 2024-01-19 DIAGNOSIS — Z9226 Personal history of immune checkpoint inhibitor therapy: Secondary | ICD-10-CM | POA: Insufficient documentation

## 2024-01-19 DIAGNOSIS — I700 Atherosclerosis of aorta: Secondary | ICD-10-CM | POA: Diagnosis not present

## 2024-01-19 DIAGNOSIS — C3431 Malignant neoplasm of lower lobe, right bronchus or lung: Secondary | ICD-10-CM | POA: Insufficient documentation

## 2024-01-19 DIAGNOSIS — Z923 Personal history of irradiation: Secondary | ICD-10-CM | POA: Insufficient documentation

## 2024-01-19 DIAGNOSIS — C3490 Malignant neoplasm of unspecified part of unspecified bronchus or lung: Secondary | ICD-10-CM

## 2024-01-19 LAB — CMP (CANCER CENTER ONLY)
ALT: 10 U/L (ref 0–44)
AST: 10 U/L — ABNORMAL LOW (ref 15–41)
Albumin: 3.8 g/dL (ref 3.5–5.0)
Alkaline Phosphatase: 57 U/L (ref 38–126)
Anion gap: 11 (ref 5–15)
BUN: 18 mg/dL (ref 8–23)
CO2: 23 mmol/L (ref 22–32)
Calcium: 8.6 mg/dL — ABNORMAL LOW (ref 8.9–10.3)
Chloride: 101 mmol/L (ref 98–111)
Creatinine: 1.04 mg/dL (ref 0.61–1.24)
GFR, Estimated: >60 mL/min (ref >60)
Glucose, Bld: 279 mg/dL — ABNORMAL HIGH (ref 70–99)
Potassium: 3.6 mmol/L (ref 3.5–5.1)
Sodium: 135 mmol/L (ref 135–145)
Total Bilirubin: 0.4 mg/dL (ref 0.0–1.2)
Total Protein: 6.7 g/dL (ref 6.5–8.1)

## 2024-01-19 LAB — CBC WITH DIFFERENTIAL (CANCER CENTER ONLY)
Abs Immature Granulocytes: 0.04 K/uL (ref 0.00–0.07)
Basophils Absolute: 0.1 K/uL (ref 0.0–0.1)
Basophils Relative: 1 %
Eosinophils Absolute: 0.1 K/uL (ref 0.0–0.5)
Eosinophils Relative: 2 %
HCT: 35 % — ABNORMAL LOW (ref 39.0–52.0)
Hemoglobin: 12.2 g/dL — ABNORMAL LOW (ref 13.0–17.0)
Immature Granulocytes: 1 %
Lymphocytes Relative: 29 %
Lymphs Abs: 2.1 K/uL (ref 0.7–4.0)
MCH: 30 pg (ref 26.0–34.0)
MCHC: 34.9 g/dL (ref 30.0–36.0)
MCV: 86 fL (ref 80.0–100.0)
Monocytes Absolute: 0.6 K/uL (ref 0.1–1.0)
Monocytes Relative: 8 %
Neutro Abs: 4.4 K/uL (ref 1.7–7.7)
Neutrophils Relative %: 59 %
Platelet Count: 146 K/uL — ABNORMAL LOW (ref 150–400)
RBC: 4.07 MIL/uL — ABNORMAL LOW (ref 4.22–5.81)
RDW: 13.3 % (ref 11.5–15.5)
WBC Count: 7.3 K/uL (ref 4.0–10.5)
nRBC: 0 % (ref 0.0–0.2)

## 2024-01-19 MED ORDER — IOHEXOL 300 MG/ML  SOLN
100.0000 mL | Freq: Once | INTRAMUSCULAR | Status: AC | PRN
  Administered 2024-01-19: 100 mL via INTRAVENOUS

## 2024-01-19 NOTE — Telephone Encounter (Signed)
 Spoke with Diane from [facility] radiology in regards to patients CT scan report.  Informed her that I would relay information to Dr. Sherrod to review.  She voiced understanding.

## 2024-01-20 MED ORDER — HYDROCODONE-ACETAMINOPHEN 10-325 MG PO TABS: 1.0000 | ORAL_TABLET | Freq: Three times a day (TID) | ORAL | 0 refills | Status: DC | PRN

## 2024-01-22 ENCOUNTER — Other Ambulatory Visit: Payer: Self-pay | Admitting: Cardiology

## 2024-01-25 ENCOUNTER — Other Ambulatory Visit: Payer: Self-pay | Admitting: Cardiology

## 2024-01-26 ENCOUNTER — Inpatient Hospital Stay

## 2024-01-26 ENCOUNTER — Telehealth: Payer: Self-pay | Admitting: Internal Medicine

## 2024-01-26 ENCOUNTER — Inpatient Hospital Stay: Admitting: Internal Medicine

## 2024-01-26 VITALS — BP 112/62 | HR 70 | Temp 97.7°F | Resp 17 | Ht 73.0 in | Wt 209.0 lb

## 2024-01-26 DIAGNOSIS — C3491 Malignant neoplasm of unspecified part of right bronchus or lung: Secondary | ICD-10-CM

## 2024-01-26 DIAGNOSIS — C3431 Malignant neoplasm of lower lobe, right bronchus or lung: Secondary | ICD-10-CM | POA: Diagnosis not present

## 2024-01-26 DIAGNOSIS — Z9226 Personal history of immune checkpoint inhibitor therapy: Secondary | ICD-10-CM | POA: Diagnosis not present

## 2024-01-26 DIAGNOSIS — Z923 Personal history of irradiation: Secondary | ICD-10-CM | POA: Diagnosis not present

## 2024-01-26 NOTE — Telephone Encounter (Signed)
 Scheduled patient for next appointment. Called and spoke with the patient, he is aware.

## 2024-01-26 NOTE — Progress Notes (Signed)
 [facility] [phone]   [phone]  OFFICE PROGRESS NOTE  Brandon Almarie LABOR, MD [address] [place] [address]  DIAGNOSIS: Metastatic non-small cell lung cancer initially diagnosed as stage IIIA (T4, N0, M0) non-small cell lung cancer, favoring adenocarcinoma presented with large right lower lobe lung mass with no mediastinal lymphadenopathy or extrathoracic metastasis diagnosed in October 2022. Had disease progression with enlarging mass and new right hilar and mediastinal lymph nodes in December 2022.      PRIOR THERAPY:  1) Neoadjuvant treatment with carboplatin  for AUC of 5, Alimta  500 Mg/M2 and nivolumab  360 Mg IV every 3 weeks.  First dose January 23, 2021.  Status post 3 cycles. 2) Concurrent chemoradiation with carboplatin  for an AUC of 2 and paclitaxel  45 mg per metered squared.  First dose expected on 04/01/2021.  Status post 4 cycles 3) Consolidation treatment with immunotherapy with Imfinzi  1500 Mg IV every 4 weeks.  First dose on Jul 08, 2021.  Status post 9 cycles.  4) Palliative systemic chemotherapy with carboplatin  for an AUC of 5, Alimta  500 mg/m, Keytruda  200 mg IV every 3 weeks. First dose on 05/13/2022.  Status post 12 cycles.  Starting him cycle #3 his dose of carboplatin  will be reduced to AUC of 4 and Alimta  400 Mg/M2 secondary to intolerance.  Last dose was given January 26, 2023 discontinued secondary to disease progression. 5) Second line systemic chemotherapy with docetaxel  65 Mg/M2 and ramucirumab  10 Mg/KG every 3 weeks with Neulasta  support.  First dose April 08, 2023.  Status post 5  cycles.  His treatment is currently on hold secondary to toxicity and recent hospitalization.   CURRENT THERAPY: Observation.  INTERVAL HISTORY: Brandon Rubio. 73 y.o. male returns to the clinic today for follow-up visit. Discussed the use of AI scribe software for clinical note transcription with the patient, who gave  verbal consent to proceed.  History of Present Illness Brandon Rubio. is a 73 year old male with metastatic non-small cell lung cancer who presents for evaluation with repeat CT scan for restaging of his disease.  He has a history of metastatic non-small cell lung cancer, adenocarcinoma, initially diagnosed as stage IIIA in October 2022. The disease progressed in December 2022. He has undergone several treatments, including systemic chemotherapy with carboplatin , Alimta , and Keytruda , which were discontinued due to disease progression. Subsequently, he received second-line systemic chemotherapy with docetaxel  and ramucirumab , which was discontinued after five cycles due to intolerance and hospitalization.  No new symptoms have been reported over the past three months. A recent bronchoscopy and biopsy of an opacity in his left lung confirmed cancer. He mentions that his granddaughter read to him from a report indicating issues with his back, specifically a disc bulge and facet arthropathy at L4-5, which he was unaware of. He notes that his regular doctor is also unaware of this finding.  He describes feeling 'lazier' and less active than before. He recalls being allergic to one of the previous medications, which caused itching, and he was given something to alleviate this symptom.    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  MEDICATIONS:  Current Outpatient Medications  Medication Sig Dispense Refill   aspirin  EC 81 MG tablet Take 81 mg by mouth in the morning. Swallow whole.     dexamethasone  (DECADRON ) 0.75 MG tablet TAKE 1 TABLET(0.75 MG) BY MOUTH DAILY 100 tablet 1   HYDROcodone -acetaminophen  (NORCO) 10-325 MG tablet Take 1 tablet by mouth every 8 (eight) hours as needed. 75 tablet 0   metFORMIN  (GLUCOPHAGE ) 1000 MG tablet TAKE 1 TABLET(1000 MG) BY MOUTH TWICE DAILY WITH A MEAL 180 tablet 0   metoprolol  tartrate (LOPRESSOR ) 25 MG tablet TAKE 1 TABLET(25 MG) BY MOUTH TWICE DAILY 180 tablet 2   omeprazole  (PRILOSEC) 20 MG capsule TAKE 1 CAPSULE BY MOUTH TWICE DAILY BEFORE A MEAL 180 capsule 1   pantoprazole  (PROTONIX ) 40 MG tablet Take 1 tablet (40 mg total) by mouth daily. 90 tablet 3   simvastatin  (ZOCOR ) 40 MG tablet TAKE 1 TABLET BY MOUTH EVERY EVENING AT 6 PM 90 tablet 0   No current facility-administered medications for this visit.    SURGICAL HISTORY:  Past Surgical History:  Procedure Laterality Date   BRONCHIAL BIOPSY  12/10/2020   Procedure: BRONCHIAL BIOPSIES;  Surgeon: Shelah Lamar RAMAN, MD;  Location: [facility] ENDOSCOPY;  Service: Pulmonary;;   BRONCHIAL BRUSHINGS  12/10/2020   Procedure: BRONCHIAL BRUSHINGS;  Surgeon: Shelah Lamar RAMAN, MD;  Location: [facility] ENDOSCOPY;  Service: Pulmonary;;   BRONCHIAL NEEDLE ASPIRATION BIOPSY  12/10/2020   Procedure: BRONCHIAL NEEDLE ASPIRATION BIOPSIES;  Surgeon: Shelah Lamar RAMAN, MD;  Location[facility] ENDOSCOPY;  Service: Pulmonary;;   COLONOSCOPY     COLONOSCOPY W/ POLYPECTOMY  03/10/2009   LARYNGOSCOPY  03/10/1997   VIDEO BRONCHOSCOPY WITH ENDOBRONCHIAL NAVIGATION N/A 12/10/2020   Procedure: ROBOTIC VIDEO BRONCHOSCOPY WITH ENDOBRONCHIAL NAVIGATION;  Surgeon: Shelah Lamar RAMAN, MD;  Location: [facility] ENDOSCOPY;  Service: Pulmonary;  Laterality: N/A;   VIDEO BRONCHOSCOPY WITH ENDOBRONCHIAL NAVIGATION Left 12/29/2023   Procedure: VIDEO  BRONCHOSCOPY WITH ENDOBRONCHIAL NAVIGATION;  Surgeon: Shelah Lamar RAMAN, MD;  Location: [facility] ENDOSCOPY;  Service: Pulmonary;  Laterality: Left;    REVIEW OF SYSTEMS:  Constitutional: positive for fatigue Eyes: negative Ears, nose, mouth, throat, and face: negative Respiratory: positive for dyspnea on exertion Cardiovascular: negative Gastrointestinal: negative Genitourinary:negative Integument/breast: negative Hematologic/lymphatic: negative Musculoskeletal:negative Neurological: negative Behavioral/Psych: negative Endocrine: negative Allergic/Immunologic: negative   PHYSICAL EXAMINATION: General appearance: alert, cooperative, fatigued, and no distress Head: Normocephalic, without obvious abnormality, atraumatic Neck: no adenopathy, no JVD, supple, symmetrical, trachea midline, and thyroid  not enlarged, symmetric, no tenderness/mass/nodules Lymph nodes: Cervical, supraclavicular, and axillary nodes normal. Resp: clear to auscultation bilaterally Back: symmetric, no curvature. ROM normal. No CVA tenderness. Cardio: regular rate and rhythm, S1, S2 normal, no murmur, click, rub or gallop GI: soft, non-tender; bowel sounds normal; no masses,  no organomegaly Extremities: extremities normal, atraumatic, no cyanosis or edema Neurologic: Alert and oriented X 3, normal strength and tone. Normal symmetric reflexes. Normal coordination and gait  ECOG PERFORMANCE STATUS: 1 - Symptomatic but completely ambulatory  Blood pressure 112/62, pulse 70, temperature 97.7 F (36.5 C), temperature source Temporal, resp. rate 17, height 6' 1 (1.854 m), weight 209  lb (94.8 kg), SpO2 95%.  LABORATORY DATA: Lab Results  Component Value Date   WBC 7.3 01/19/2024   HGB 12.2 (L) 01/19/2024   HCT 35.0 (L) 01/19/2024   MCV 86.0 01/19/2024   PLT 146 (L) 01/19/2024      Chemistry      Component Value Date/Time   NA 135 01/19/2024 0941   NA 137 10/24/2022 1540   K 3.6 01/19/2024 0941   CL 101  01/19/2024 0941   CO2 23 01/19/2024 0941   BUN 18 01/19/2024 0941   BUN 9 10/24/2022 1540   CREATININE 1.04 01/19/2024 0941      Component Value Date/Time   CALCIUM  8.6 (L) 01/19/2024 0941   ALKPHOS 57 01/19/2024 0941   AST 10 (L) 01/19/2024 0941   ALT 10 01/19/2024 0941   BILITOT 0.4 01/19/2024 0941       RADIOGRAPHIC STUDIES: CT CHEST ABDOMEN PELVIS W CONTRAST Result Date: 01/19/2024 CLINICAL DATA:  Non-small cell lung cancer restaging * Tracking Code: BO * EXAM: CT CHEST, ABDOMEN, AND PELVIS WITH CONTRAST TECHNIQUE: Multidetector CT imaging of the chest, abdomen and pelvis was performed following the standard protocol during bolus administration of intravenous contrast. RADIATION DOSE REDUCTION: This exam was performed according to the departmental dose-optimization program which includes automated exposure control, adjustment of the mA and/or kV according to patient size and/or use of iterative reconstruction technique. CONTRAST:  OMNIPAQUE  IOHEXOL  300 MG/ML  SOLN COMPARISON:  10/21/2023 and 12/24/2023 FINDINGS: CT CHEST FINDINGS Cardiovascular: Progressive marginal/peripheral soft tissue density along the right lower lobe pulmonary artery suspicious for chronic embolus or tumor ingrowth for example on image 80 series 5 and images 29 through 33 of series 2. The filling defect appears to be progressive from previous and marginal rather than central. Tumor invasion of the right lower lobe pulmonary arteries not excluded. Today's exam was performed as a diagnostic chest CT rather than a pulmonary embolus protocol CTA exam. Coronary, aortic arch, and branch vessel atherosclerotic vascular disease. Mediastinum/Nodes: No pathologically enlarged adenopathy in the chest identified. Lungs/Pleura: Consolidation and volume loss with associated calcifications in the right lower lobe similar to prior exams and likely reflecting therapy and treated tumor. Worsening consolidation of nodules in the  aerated portion of the right lower lobe currently measuring about 2.4 by 1.2 cm on image 92 of series 6, previously this was manifested as 2 separate smaller nodules. Subsolid right middle lobe nodule 1.8 by 1.3 cm on image 89 series 6, formerly 1.8 by 1.1 cm on 10/21/2023. Mild right perihilar consolidation and volume loss likely treatment related. Progressive consolidative nodularity in the left upper lobe and left lower lobe worrisome for malignancy, including a 9.5 by 5.9 cm mass in the left lower lobe on image 78 series 5, formerly 8.1 by 5.8 cm by my measurements. Mixed density mass anteriorly in the left lower lobe measuring about 3.8 by 2.3 cm on image 68 series 6 has a solid component measuring 1.5 by 1.2 cm on image 69 series 6, previously the same. Musculoskeletal: Thoracic spondylosis. CT ABDOMEN PELVIS FINDINGS Hepatobiliary: Unremarkable Pancreas: Unremarkable Spleen: Unremarkable Adrenals/Urinary Tract: Fluid density cyst of the left kidney upper pole warrants no further imaging workup. Adrenal glands unremarkable. Stomach/Bowel: Gastric wall/fold thickening may be from nondistention, cannot exclude gastritis. Vascular/Lymphatic: Atherosclerosis is present, including aortoiliac atherosclerotic disease. Reproductive: Unremarkable Other: No supplemental non-categorized findings. Musculoskeletal: Fatty prominence along the left hip adductor musculature is chronic and probably from a lipoma. Disc bulge and facet arthropathy at  L4-5 contribute to borderline right foraminal stenosis. IMPRESSION: 1. Mildly progressive multifocal nodularity and subsolid opacities in both lungs suspicious for malignancy. 2. Progressive marginal/peripheral soft tissue density along the right lower lobe pulmonary artery suspicious for chronic embolus or tumor ingrowth. Tumor invasion of the right lower lobe pulmonary arteries not excluded. Today's exam was performed as a diagnostic chest CT rather than a pulmonary embolus  protocol CTA exam. 3. Gastric wall/fold thickening may be from nondistention, cannot exclude gastritis. 4.  Aortic Atherosclerosis (ICD10-I70.0). These results will be called to the ordering clinician or representative by the Radiologist Assistant, and communication documented in the PACS or [employer]. Electronically Signed   By: Ryan Salvage M.D.   On: 01/19/2024 11:32   DG Chest Port 1 View Result Date: 12/29/2023 EXAM: 1 VIEW(S) XRAY OF THE CHEST 12/29/2023 11:02:51 AM COMPARISON: 07/05/2023 and CT of 12/24/2023. CLINICAL HISTORY: S/P bronchoscopy with biopsy [id number]. S/p bronch, left lung biopsy; rover. FINDINGS: LUNGS AND PLEURA: Increasing opacity in left lower lung, corresponding to known mass. Stable right mid lung scarring. No pulmonary edema. No pleural effusion. No pneumothorax. HEART AND MEDIASTINUM: Reverse apical aortic positioning. BONES AND SOFT TISSUES: No acute osseous abnormality. IMPRESSION: 1. No pneumothorax or other acute complication after bronchoscopy. 2. Left lower lobe lung mass, as before. Electronically signed by: Rockey Kilts MD 12/29/2023 12:30 PM EDT RP Workstation: [id number]   DG C-ARM BRONCHOSCOPY Result Date: 12/29/2023 C-ARM BRONCHOSCOPY: Fluoroscopy was utilized by the requesting physician.  No radiographic interpretation.     ASSESSMENT AND PLAN: This is a very pleasant 73 years old white male with history of a stage IIIA (T4, N1, M0) non-small cell lung cancer, adenocarcinoma presented with right lower lobe lung mass with a right hilar and no mediastinal lymphadenopathy and no evidence of extrathoracic disease.  The patient was diagnosed in October 2022 status post 3 cycles of neoadjuvant systemic chemotherapy with carboplatin , Alimta  and nivolumab  but unfortunately has no evidence for significant improvement of his disease and actually has some evidence for progression. The patient underwent a course of concurrent chemoradiation weekly carboplatin  for  AUC of 2 and paclitaxel  45 Mg/M2.  Status post 4 cycles. The patient had a rough time with the course of concurrent chemoradiation and he was hospitalized several times during this course. Imaging studies after the course of concurrent chemoradiation showed no concerning findings for disease progression. The patient underwent treatment with immunotherapy with Imfinzi  1500 Mg IV every 4 weeks status post 9 cycles.  This treatment was discontinued secondary to disease progression. The patient is currently on systemic chemotherapy with carboplatin , Alimta  and Keytruda  status post 12 cycles.  Starting from cycle #5 he has been on treatment with maintenance Alimta  and Keytruda  every 3 weeks.  The patient has been tolerating this treatment fairly well.  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  Repeat CT scan of the chest showed the diffuse opacity in the lungs bilaterally more on the left suspicious for disease progression rather than immunotherapy mediated pneumonitis. The patient had a PET scan performed recently and it showed multifocal adenocarcinoma progressive from comparison examination. He is currently on systemic chemotherapy with reduced dose docetaxel  65 Mg/M2 and ramucirumab  10 Mg/KG every 3 weeks status post 5 cycles. The Neulasta  injection was discontinued secondary to intolerance.  His treatment is currently on hold secondary to toxicity and recent hospitalization. He is currently on observation.  He had repeat bronchoscopy and biopsy of the left lung opacity recently that was consistent with metastatic adenocarcinoma.  The patient had repeat CT scan of the chest, abdomen and pelvis performed recently.  I personally independently reviewed the scan and discussed the result with the patient today.  His scan showed continuous progressive opacity in the lungs bilaterally concerning  for disease progression. Assessment and Plan Assessment & Plan Metastatic non-small cell lung adenocarcinoma Initially diagnosed as stage IIIA in October 2022 with disease progression in December 2022. Previous treatments included carboplatin , Alimta , and Keytruda , discontinued due to disease progression. Second-line treatment with docetaxel  and ramucirumab  was discontinued due to intolerance and hospitalization. Current CT scan shows persistent opacities in both lungs, indicating disease progression. He is not interested in radiation therapy due to potential lung damage. - Ordered biopsy for PD-L1 and CMET overexpression testing. - Will consider ipilimumab and nivolumab  combination therapy if PD-L1 and CMET markers are positive could also benefit from treatment with Telisotuzumab Vedotin. - If markers are negative, will discuss mild chemotherapy options, potentially Gemzar. - Scheduled follow-up appointment in three weeks to review biopsy results and discuss treatment options. The patient was advised to call immediately if he has any concerning symptoms in the interval. The patient voices understanding of current disease status and treatment options and is in agreement with the current care plan.  All questions were answered. The patient knows to call the clinic with any problems, questions or concerns. We can certainly see the patient much sooner if necessary. The total time spent in the appointment was 30 minutes including review of chart and various tests results, discussions about plan of care and coordination of care plan .   Disclaimer: This note was dictated with voice recognition software. Similar sounding words can inadvertently be transcribed and may not be corrected upon review.

## 2024-02-05 ENCOUNTER — Encounter (HOSPITAL_COMMUNITY): Payer: Self-pay

## 2024-02-10 ENCOUNTER — Encounter (HOSPITAL_COMMUNITY): Payer: Self-pay

## 2024-02-12 ENCOUNTER — Other Ambulatory Visit: Payer: Self-pay | Admitting: Internal Medicine

## 2024-02-12 MED ORDER — HYDROCODONE-ACETAMINOPHEN 10-325 MG PO TABS: 1.0000 | ORAL_TABLET | Freq: Three times a day (TID) | ORAL | 0 refills | Status: DC | PRN

## 2024-02-12 NOTE — Telephone Encounter (Signed)
 Copied from CRM #[id number]. Topic: Clinical - Medication Refill >> Feb 12, 2024  8:25 AM Thersia C wrote: Medication: HYDROcodone -acetaminophen  (NORCO) 10-325 MG tablet   Has the patient contacted their pharmacy? Yes (Agent: If no, request that the patient contact the pharmacy for the refill. If patient does not wish to contact the pharmacy document the reason why and proceed with request.) (Agent: If yes, when and what did the pharmacy advise?)  This is the patient's preferred pharmacy:  [facility] #[id number] [place], [place] - [address] AT [facility] OF [address] & [facility] [address] [place] [address] Phone: [phone] Fax: [phone]  Is this the correct pharmacy for this prescription? Yes If no, delete pharmacy and type the correct one.   Has the prescription been filled recently? No  Is the patient out of the medication? Yes  Has the patient been seen for an appointment in the last year OR does the patient have an upcoming appointment? Yes  Can we respond through MyChart? Yes  Agent: Please be advised that Rx refills may take up to 3 business days. We ask that you follow-up with your pharmacy.

## 2024-02-16 ENCOUNTER — Inpatient Hospital Stay: Admitting: Internal Medicine

## 2024-02-16 ENCOUNTER — Inpatient Hospital Stay: Attending: Internal Medicine

## 2024-02-16 VITALS — BP 112/68 | HR 70 | Temp 97.6°F | Resp 17 | Ht 73.0 in | Wt 206.0 lb

## 2024-02-16 DIAGNOSIS — I252 Old myocardial infarction: Secondary | ICD-10-CM | POA: Insufficient documentation

## 2024-02-16 DIAGNOSIS — Z923 Personal history of irradiation: Secondary | ICD-10-CM | POA: Insufficient documentation

## 2024-02-16 DIAGNOSIS — Z7984 Long term (current) use of oral hypoglycemic drugs: Secondary | ICD-10-CM | POA: Diagnosis not present

## 2024-02-16 DIAGNOSIS — C3491 Malignant neoplasm of unspecified part of right bronchus or lung: Secondary | ICD-10-CM | POA: Diagnosis not present

## 2024-02-16 DIAGNOSIS — Z5111 Encounter for antineoplastic chemotherapy: Secondary | ICD-10-CM | POA: Insufficient documentation

## 2024-02-16 DIAGNOSIS — H538 Other visual disturbances: Secondary | ICD-10-CM | POA: Diagnosis not present

## 2024-02-16 DIAGNOSIS — E1165 Type 2 diabetes mellitus with hyperglycemia: Secondary | ICD-10-CM | POA: Insufficient documentation

## 2024-02-16 DIAGNOSIS — Z86718 Personal history of other venous thrombosis and embolism: Secondary | ICD-10-CM | POA: Diagnosis not present

## 2024-02-16 DIAGNOSIS — R5383 Other fatigue: Secondary | ICD-10-CM | POA: Diagnosis not present

## 2024-02-16 DIAGNOSIS — C3431 Malignant neoplasm of lower lobe, right bronchus or lung: Secondary | ICD-10-CM | POA: Diagnosis present

## 2024-02-16 DIAGNOSIS — R0609 Other forms of dyspnea: Secondary | ICD-10-CM | POA: Diagnosis not present

## 2024-02-16 LAB — CMP (CANCER CENTER ONLY)
ALT: 10 U/L (ref 0–44)
AST: 16 U/L (ref 15–41)
Albumin: 3.9 g/dL (ref 3.5–5.0)
Alkaline Phosphatase: 67 U/L (ref 38–126)
Anion gap: 14 (ref 5–15)
BUN: 16 mg/dL (ref 8–23)
CO2: 20 mmol/L — ABNORMAL LOW (ref 22–32)
Calcium: 8.9 mg/dL (ref 8.9–10.3)
Chloride: 99 mmol/L (ref 98–111)
Creatinine: 1.35 mg/dL — ABNORMAL HIGH (ref 0.61–1.24)
GFR, Estimated: 55 mL/min — ABNORMAL LOW (ref >60)
Glucose, Bld: 391 mg/dL — ABNORMAL HIGH (ref 70–99)
Potassium: 4.1 mmol/L (ref 3.5–5.1)
Sodium: 133 mmol/L — ABNORMAL LOW (ref 135–145)
Total Bilirubin: 0.3 mg/dL (ref 0.0–1.2)
Total Protein: 6.9 g/dL (ref 6.5–8.1)

## 2024-02-16 LAB — CBC WITH DIFFERENTIAL (CANCER CENTER ONLY)
Abs Immature Granulocytes: 0.05 K/uL (ref 0.00–0.07)
Basophils Absolute: 0.1 K/uL (ref 0.0–0.1)
Basophils Relative: 1 %
Eosinophils Absolute: 0.1 K/uL (ref 0.0–0.5)
Eosinophils Relative: 1 %
HCT: 37 % — ABNORMAL LOW (ref 39.0–52.0)
Hemoglobin: 12.9 g/dL — ABNORMAL LOW (ref 13.0–17.0)
Immature Granulocytes: 1 %
Lymphocytes Relative: 26 %
Lymphs Abs: 2 K/uL (ref 0.7–4.0)
MCH: 30.3 pg (ref 26.0–34.0)
MCHC: 34.9 g/dL (ref 30.0–36.0)
MCV: 86.9 fL (ref 80.0–100.0)
Monocytes Absolute: 0.6 K/uL (ref 0.1–1.0)
Monocytes Relative: 8 %
Neutro Abs: 4.8 K/uL (ref 1.7–7.7)
Neutrophils Relative %: 63 %
Platelet Count: 175 K/uL (ref 150–400)
RBC: 4.26 MIL/uL (ref 4.22–5.81)
RDW: 13.7 % (ref 11.5–15.5)
WBC Count: 7.6 K/uL (ref 4.0–10.5)
nRBC: 0 % (ref 0.0–0.2)

## 2024-02-16 MED ORDER — ONDANSETRON HCL 8 MG PO TABS: 8.0000 mg | ORAL_TABLET | Freq: Three times a day (TID) | ORAL | 1 refills | Status: AC | PRN

## 2024-02-16 MED ORDER — PROCHLORPERAZINE MALEATE 10 MG PO TABS: 10.0000 mg | ORAL_TABLET | Freq: Four times a day (QID) | ORAL | 1 refills | Status: AC | PRN

## 2024-02-16 NOTE — Progress Notes (Signed)
 DISCONTINUE ON PATHWAY REGIMEN - Non-Small Cell Lung     A cycle is every 21 days:     Ramucirumab       Docetaxel    **Always confirm dose/schedule in your pharmacy ordering system**  PRIOR TREATMENT: [id number]: Docetaxel  75 mg/m2 + Ramucirumab  10 mg/kg q21 Days Until Progression or Unacceptable Toxicity  START OFF PATHWAY REGIMEN - Non-Small Cell Lung   [id number]:Gemcitabine 1,000 mg/m2 IV D1,8 q21 Days:   A cycle is every 21 days:     Gemcitabine   **Always confirm dose/schedule in your pharmacy ordering system**  Patient Characteristics: Stage IV Metastatic, Nonsquamous, Molecular Analysis Completed, Molecular Alteration Present and Targeted Therapy Exhausted, OR EGFR/KRAS/HER2/NRG1/c-Met Present and Standard Chemotherapy/Immunotherapy Planned, OR No Alteration Present, Third Line -  Chemotherapy/Immunotherapy, PS = 0, 1 Therapeutic Status: Stage IV Metastatic Histology: Nonsquamous Cell Broad Molecular Profiling Status: Molecular Analysis Completed Molecular Analysis Results: No Alteration Present Chemotherapy/Immunotherapy Line of Therapy: Third Line Chemotherapy/Immunotherapy ECOG Performance Status: 1 Intent of Therapy: Non-Curative / Palliative Intent, Discussed with Patient

## 2024-02-16 NOTE — Progress Notes (Signed)
 [facility] [phone]   [phone]  OFFICE PROGRESS NOTE  Rollene Almarie LABOR, MD [address] [place] [address]  DIAGNOSIS: Metastatic non-small cell lung cancer initially diagnosed as stage IIIA (T4, N0, M0) non-small cell lung cancer, favoring adenocarcinoma presented with large right lower lobe lung mass with no mediastinal lymphadenopathy or extrathoracic metastasis diagnosed in October 2022. Had disease progression with enlarging mass and new right hilar and mediastinal lymph nodes in December 2022.   Molecular studies by foundation 1 showed no actionable mutations. cMET expression 10% PDL1 TPS 0%    PRIOR THERAPY:  1) Neoadjuvant treatment with carboplatin  for AUC of 5, Alimta  500 Mg/M2 and nivolumab  360 Mg IV every 3 weeks.  First dose January 23, 2021.  Status post 3 cycles. 2) Concurrent chemoradiation with carboplatin  for an AUC of 2 and paclitaxel  45 mg per metered squared.  First dose expected on 04/01/2021.  Status post 4 cycles 3) Consolidation treatment with immunotherapy with Imfinzi  1500 Mg IV every 4 weeks.  First dose on Jul 08, 2021.  Status post 9 cycles.  4) Palliative systemic chemotherapy with carboplatin  for an AUC of 5, Alimta  500 mg/m, Keytruda  200 mg IV every 3 weeks. First dose on 05/13/2022.  Status post 12 cycles.  Starting him cycle #3 his dose of carboplatin  will be reduced to AUC of 4 and Alimta  400 Mg/M2 secondary to intolerance.  Last dose was given January 26, 2023 discontinued secondary to disease progression. 5) Second line systemic chemotherapy with docetaxel  65 Mg/M2 and ramucirumab  10 Mg/KG every 3 weeks with Neulasta  support.  First dose April 08, 2023.  Status post 6  cycles.  His treatment is currently on hold secondary to toxicity and recent hospitalization.  Last dose was given Jul 22, 2023 discontinued secondary to intolerance   CURRENT THERAPY: Gemcitabine 1000 mg/M2 on days 1 and 8 every 3  weeks.  First dose March 08, 2024  INTERVAL HISTORY: Brandon Rubio. 73 y.o. male returns to the clinic today for follow-up visit. Discussed the use of AI scribe software for clinical note transcription with the patient, who gave verbal consent to proceed.  History of Present Illness Brandon Rubio. is a 73 year old male with metastatic non-small cell lung cancer who presents with concerns about disease progression and gastrointestinal symptoms.  He has a history of metastatic non-small cell lung cancer, initially diagnosed as stage IIIA in October 2022, with evidence of metastatic progression by December 2022. He feels sluggish and experiences stomach pain after eating. No diarrhea or constipation. He has not used any over-the-counter medications like simethicone  for these symptoms.  He has diabetes, with current blood sugar levels reported as very high at 391 mg/dL. He consumes a lot of hard candy, which may be contributing to his elevated blood sugar levels. He is currently taking metformin  500 mg twice daily. He had to cancel a recent appointment with his endocrinologist due to a scheduling conflict with a biopsy appointment.  He reports blurry vision and suspects cataracts, with an eye appointment scheduled for February 29, 2024. He is using the same eye doctor as Diane, but notes that the doctor has retired and his care has been transferred to Dr. Dorothyann Quail.    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  MEDICATIONS:  Current Outpatient Medications  Medication Sig Dispense Refill   aspirin  EC 81 MG tablet Take 81 mg by mouth in the morning. Swallow whole.     dexamethasone  (DECADRON ) 0.75 MG tablet TAKE 1 TABLET(0.75 MG) BY MOUTH DAILY 100 tablet 1   HYDROcodone -acetaminophen  (NORCO) 10-325 MG tablet Take 1 tablet by mouth every 8 (eight) hours as needed. 75 tablet 0   metFORMIN  (GLUCOPHAGE ) 1000 MG tablet TAKE 1 TABLET(1000 MG) BY MOUTH TWICE DAILY WITH A MEAL 180 tablet 0   metoprolol  tartrate (LOPRESSOR ) 25 MG tablet TAKE 1 TABLET(25 MG) BY MOUTH TWICE DAILY 180 tablet 2   omeprazole  (PRILOSEC) 20 MG capsule TAKE 1 CAPSULE BY MOUTH TWICE DAILY BEFORE A MEAL 180 capsule 1   pantoprazole  (PROTONIX ) 40 MG tablet Take 1 tablet (40 mg total) by mouth daily. 90 tablet 3   simvastatin  (ZOCOR ) 40 MG tablet TAKE 1 TABLET BY MOUTH EVERY EVENING AT 6 PM 90 tablet 0   No current facility-administered medications for this visit.    SURGICAL HISTORY:  Past Surgical History:  Procedure Laterality Date   BRONCHIAL BIOPSY  12/10/2020   Procedure: BRONCHIAL BIOPSIES;  Surgeon: Shelah Lamar RAMAN, MD;  Location: [facility] ENDOSCOPY;  Service: Pulmonary;;   BRONCHIAL BRUSHINGS  12/10/2020   Procedure: BRONCHIAL BRUSHINGS;  Surgeon: Shelah Lamar RAMAN, MD;  Location: [facility] ENDOSCOPY;  Service: Pulmonary;;   BRONCHIAL NEEDLE ASPIRATION BIOPSY  12/10/2020   Procedure: BRONCHIAL NEEDLE ASPIRATION BIOPSIES;  Surgeon: Shelah Lamar RAMAN, MD;  Location: [facility] ENDOSCOPY;  Service: Pulmonary;;   COLONOSCOPY     COLONOSCOPY W/ POLYPECTOMY  03/10/2009   LARYNGOSCOPY  03/10/1997   VIDEO BRONCHOSCOPY WITH ENDOBRONCHIAL NAVIGATION N/A 12/10/2020   Procedure: ROBOTIC VIDEO BRONCHOSCOPY WITH ENDOBRONCHIAL NAVIGATION;  Surgeon: Shelah Lamar RAMAN, MD;  Location: [facility] ENDOSCOPY;  Service: Pulmonary;   Laterality: N/A;   VIDEO BRONCHOSCOPY WITH ENDOBRONCHIAL NAVIGATION Left 12/29/2023   Procedure: VIDEO BRONCHOSCOPY WITH ENDOBRONCHIAL NAVIGATION;  Surgeon: Shelah Lamar RAMAN, MD;  Location: [facility]OSCOPY;  Service: Pulmonary;  Laterality: Left;    REVIEW OF SYSTEMS:  Constitutional: positive for fatigue Eyes: negative Ears, nose, mouth, throat, and face: negative Respiratory: positive for dyspnea on exertion Cardiovascular: negative Gastrointestinal: negative Genitourinary:negative Integument/breast: negative Hematologic/lymphatic: negative Musculoskeletal:negative Neurological: negative Behavioral/Psych: negative Endocrine: negative Allergic/Immunologic: negative   PHYSICAL EXAMINATION: General appearance: alert, cooperative, fatigued, and no distress Head: Normocephalic, without obvious abnormality, atraumatic Neck: no adenopathy, no JVD, supple, symmetrical, trachea midline, and thyroid  not enlarged, symmetric, no tenderness/mass/nodules Lymph nodes: Cervical, supraclavicular, and axillary nodes normal. Resp: clear to auscultation bilaterally Back: symmetric, no curvature. ROM normal. No CVA tenderness. Cardio: regular rate and rhythm, S1, S2 normal, no murmur, click, rub or gallop GI: soft, non-tender; bowel sounds normal; no masses,  no organomegaly Extremities: extremities normal, atraumatic, no cyanosis or edema Neurologic: Alert and oriented X 3, normal strength and tone. Normal symmetric reflexes. Normal coordination and gait  ECOG PERFORMANCE STATUS: 1 -  Symptomatic but completely ambulatory  Blood pressure 112/68, pulse 70, temperature 97.6 F (36.4 C), temperature source Temporal, resp. rate 17, height 6' 1 (1.854 m), weight 206 lb (93.4 kg), SpO2 95%.  LABORATORY DATA: Lab Results  Component Value Date   WBC 7.6 02/16/2024   HGB 12.9 (L) 02/16/2024   HCT 37.0 (L) 02/16/2024   MCV 86.9 02/16/2024   PLT 175 02/16/2024      Chemistry      Component Value  Date/Time   NA 133 (L) 02/16/2024 1033   NA 137 10/24/2022 1540   K 4.1 02/16/2024 1033   CL 99 02/16/2024 1033   CO2 20 (L) 02/16/2024 1033   BUN 16 02/16/2024 1033   BUN 9 10/24/2022 1540   CREATININE 1.35 (H) 02/16/2024 1033      Component Value Date/Time   CALCIUM  8.9 02/16/2024 1033   ALKPHOS 67 02/16/2024 1033   AST 16 02/16/2024 1033   ALT 10 02/16/2024 1033   BILITOT 0.3 02/16/2024 1033       RADIOGRAPHIC STUDIES: CT CHEST ABDOMEN PELVIS W CONTRAST Result Date: 01/19/2024 CLINICAL DATA:  Non-small cell lung cancer restaging * Tracking Code: BO * EXAM: CT CHEST, ABDOMEN, AND PELVIS WITH CONTRAST TECHNIQUE: Multidetector CT imaging of the chest, abdomen and pelvis was performed following the standard protocol during bolus administration of intravenous contrast. RADIATION DOSE REDUCTION: This exam was performed according to the departmental dose-optimization program which includes automated exposure control, adjustment of the mA and/or kV according to patient size and/or use of iterative reconstruction technique. CONTRAST:  OMNIPAQUE  IOHEXOL  300 MG/ML  SOLN COMPARISON:  10/21/2023 and 12/24/2023 FINDINGS: CT CHEST FINDINGS Cardiovascular: Progressive marginal/peripheral soft tissue density along the right lower lobe pulmonary artery suspicious for chronic embolus or tumor ingrowth for example on image 80 series 5 and images 29 through 33 of series 2. The filling defect appears to be progressive from previous and marginal rather than central. Tumor invasion of the right lower lobe pulmonary arteries not excluded. Today's exam was performed as a diagnostic chest CT rather than a pulmonary embolus protocol CTA exam. Coronary, aortic arch, and branch vessel atherosclerotic vascular disease. Mediastinum/Nodes: No pathologically enlarged adenopathy in the chest identified. Lungs/Pleura: Consolidation and volume loss with associated calcifications in the right lower lobe similar to prior  exams and likely reflecting therapy and treated tumor. Worsening consolidation of nodules in the aerated portion of the right lower lobe currently measuring about 2.4 by 1.2 cm on image 92 of series 6, previously this was manifested as 2 separate smaller nodules. Subsolid right middle lobe nodule 1.8 by 1.3 cm on image 89 series 6, formerly 1.8 by 1.1 cm on 10/21/2023. Mild right perihilar consolidation and volume loss likely treatment related. Progressive consolidative nodularity in the left upper lobe and left lower lobe worrisome for malignancy, including a 9.5 by 5.9 cm mass in the left lower lobe on image 78 series 5, formerly 8.1 by 5.8 cm by my measurements. Mixed density mass anteriorly in the left lower lobe measuring about 3.8 by 2.3 cm on image 68 series 6 has a solid component measuring 1.5 by 1.2 cm on image 69 series 6, previously the same. Musculoskeletal: Thoracic spondylosis. CT ABDOMEN PELVIS FINDINGS Hepatobiliary: Unremarkable Pancreas: Unremarkable Spleen: Unremarkable Adrenals/Urinary Tract: Fluid density cyst of the left kidney upper pole warrants no further imaging workup. Adrenal glands unremarkable. Stomach/Bowel: Gastric wall/fold thickening may be from nondistention, cannot exclude gastritis. Vascular/Lymphatic: Atherosclerosis is present, including aortoiliac atherosclerotic disease. Reproductive:  Unremarkable Other: No supplemental non-categorized findings. Musculoskeletal: Fatty prominence along the left hip adductor musculature is chronic and probably from a lipoma. Disc bulge and facet arthropathy at L4-5 contribute to borderline right foraminal stenosis. IMPRESSION: 1. Mildly progressive multifocal nodularity and subsolid opacities in both lungs suspicious for malignancy. 2. Progressive marginal/peripheral soft tissue density along the right lower lobe pulmonary artery suspicious for chronic embolus or tumor ingrowth. Tumor invasion of the right lower lobe pulmonary arteries not  excluded. Today's exam was performed as a diagnostic chest CT rather than a pulmonary embolus protocol CTA exam. 3. Gastric wall/fold thickening may be from nondistention, cannot exclude gastritis. 4.  Aortic Atherosclerosis (ICD10-I70.0). These results will be called to the ordering clinician or representative by the Radiologist Assistant, and communication documented in the PACS or [employer]. Electronically Signed   By: Ryan Salvage M.D.   On: 01/19/2024 11:32     ASSESSMENT AND PLAN: This is a very pleasant 73 years old white male with history of a stage IIIA (T4, N1, M0) non-small cell lung cancer, adenocarcinoma presented with right lower lobe lung mass with a right hilar and no mediastinal lymphadenopathy and no evidence of extrathoracic disease.  The patient was diagnosed in October 2022 status post 3 cycles of neoadjuvant systemic chemotherapy with carboplatin , Alimta  and nivolumab  but unfortunately has no evidence for significant improvement of his disease and actually has some evidence for progression. The patient underwent a course of concurrent chemoradiation weekly carboplatin  for AUC of 2 and paclitaxel  45 Mg/M2.  Status post 4 cycles. The patient had a rough time with the course of concurrent chemoradiation and he was hospitalized several times during this course. Imaging studies after the course of concurrent chemoradiation showed no concerning findings for disease progression. The patient underwent treatment with immunotherapy with Imfinzi  1500 Mg IV every 4 weeks status post 9 cycles.  This treatment was discontinued secondary to disease progression. The patient is currently on systemic chemotherapy with carboplatin , Alimta  and Keytruda  status post 12 cycles.  Starting from cycle #5 he has been on treatment with maintenance Alimta  and Keytruda  every 3 weeks.  The patient has been tolerating this treatment fairly well.  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  Repeat CT scan of the chest showed the diffuse opacity in the lungs bilaterally more on the left suspicious for disease progression rather than immunotherapy mediated pneumonitis. The patient had a PET scan performed recently and it showed multifocal adenocarcinoma progressive from comparison examination. He is currently on systemic chemotherapy with reduced dose docetaxel  65 Mg/M2 and ramucirumab  10 Mg/KG every 3 weeks status post 5 cycles. The Neulasta  injection was discontinued secondary to intolerance.  His treatment is currently on hold secondary to toxicity and recent hospitalization. He is currently on observation.   He had repeat bronchoscopy and biopsy of the left lung opacity recently that was consistent with metastatic adenocarcinoma.   Molecular studies showed no actionable mutation and lower expression of PD-L1 as well as c-Met. Assessment and Plan Assessment & Plan Metastatic non-small cell lung cancer, stage IV Stage IV metastatic non-small cell lung cancer with progression on recent CT scan. Previous molecular study did not yield actionable targets for oral therapy. Chemotherapy remains the primary treatment option. He expressed reluctance towards chemotherapy but agreed to try gemcitabine due to its potentially better tolerance compared to previous treatments. He is aware of the prognosis  and is seeking to extend life expectancy beyond the initial estimate of 1-2 years. - Will initiate gemcitabine chemotherapy regimen: two weeks on, one week off. - Will schedule chemotherapy sessions to start the week after Christmas. - Will coordinate with ophthalmology for cataract evaluation and potential intervention. The patient was advised to call immediately if he has any other concerning symptoms in the interval.  The patient voices understanding of current disease status and treatment  options and is in agreement with the current care plan.  All questions were answered. The patient knows to call the clinic with any problems, questions or concerns. We can certainly see the patient much sooner if necessary. The total time spent in the appointment was 30 minutes including review of chart and various tests results, discussions about plan of care and coordination of care plan .   Disclaimer: This note was dictated with voice recognition software. Similar sounding words can inadvertently be transcribed and may not be corrected upon review.

## 2024-02-29 ENCOUNTER — Other Ambulatory Visit: Payer: Self-pay | Admitting: Internal Medicine

## 2024-02-29 DIAGNOSIS — C3491 Malignant neoplasm of unspecified part of right bronchus or lung: Secondary | ICD-10-CM

## 2024-02-29 NOTE — Progress Notes (Signed)
 Pharmacist Chemotherapy Monitoring - Initial Assessment    Anticipated start date: 03/07/24   The following has been reviewed per standard work regarding the patient's treatment regimen: The patient's diagnosis, treatment plan and drug doses, and organ/hematologic function Lab orders and baseline tests specific to treatment regimen  The treatment plan start date, drug sequencing, and pre-medications Prior authorization status  Patient's documented medication list, including drug-drug interaction screen and prescriptions for anti-emetics and supportive care specific to the treatment regimen The drug concentrations, fluid compatibility, administration routes, and timing of the medications to be used The patient's access for treatment and lifetime cumulative dose history, if applicable  The patient's medication allergies and previous infusion related reactions, if applicable   Changes made to treatment plan:  N/A  Follow up needed:  N/A   Ceriah Kohler, PharmD, MBA

## 2024-03-01 ENCOUNTER — Encounter: Payer: Self-pay | Admitting: Internal Medicine

## 2024-03-01 ENCOUNTER — Other Ambulatory Visit: Payer: Self-pay

## 2024-03-01 ENCOUNTER — Ambulatory Visit: Admitting: Internal Medicine

## 2024-03-01 VITALS — BP 120/60 | HR 74 | Ht 73.0 in | Wt 206.4 lb

## 2024-03-01 DIAGNOSIS — Z794 Long term (current) use of insulin: Secondary | ICD-10-CM | POA: Diagnosis not present

## 2024-03-01 DIAGNOSIS — Z7984 Long term (current) use of oral hypoglycemic drugs: Secondary | ICD-10-CM | POA: Diagnosis not present

## 2024-03-01 DIAGNOSIS — E1159 Type 2 diabetes mellitus with other circulatory complications: Secondary | ICD-10-CM | POA: Diagnosis not present

## 2024-03-01 DIAGNOSIS — E1165 Type 2 diabetes mellitus with hyperglycemia: Secondary | ICD-10-CM

## 2024-03-01 DIAGNOSIS — E2749 Other adrenocortical insufficiency: Secondary | ICD-10-CM

## 2024-03-01 LAB — POCT GLYCOSYLATED HEMOGLOBIN (HGB A1C): Hemoglobin A1C: 10.6 % — AB (ref 4.0–5.6)

## 2024-03-01 MED ORDER — METFORMIN HCL 1000 MG PO TABS: 1000.0000 mg | ORAL_TABLET | Freq: Two times a day (BID) | ORAL | 3 refills | Status: AC

## 2024-03-01 MED ORDER — LANTUS SOLOSTAR 100 UNIT/ML ~~LOC~~ SOPN
15.0000 [IU] | PEN_INJECTOR | Freq: Every day | SUBCUTANEOUS | 0 refills | Status: AC
  Filled 2024-03-01: qty 12, 80d supply, fill #0

## 2024-03-01 MED ORDER — INSULIN PEN NEEDLE 32G X 4 MM MISC: 3 refills | Status: DC

## 2024-03-01 MED ORDER — INSULIN PEN NEEDLE 32G X 4 MM MISC
0 refills | Status: AC
  Filled 2024-03-01: qty 100, 90d supply, fill #0

## 2024-03-01 MED ORDER — LANTUS SOLOSTAR 100 UNIT/ML ~~LOC~~ SOPN: 15.0000 [IU] | PEN_INJECTOR | Freq: Every day | SUBCUTANEOUS | 3 refills | Status: DC

## 2024-03-01 NOTE — Patient Instructions (Addendum)
 Please continue Dexamethasone  0.75 mg daily in am.  You absolutely need to take this medication every day and not skip doses.  Please double the dose if you have a fever, for the duration of the fever.  Also, if you have significant other stress like dehydration, diarrhea, or otherwise unexplained weakness.  If you cannot take anything by mouth (vomiting) or you have severe diarrhea so that you eliminate the steroid pills in your stool, please make sure that you get the steroid in the vein instead - go to the nearest emergency department/urgent care or you may go to your PCPs office.  Please get a MedAlert bracelet or pendant indicating: Adrenal insufficiency.   Please continue: - Metformin  1000 mg x2 daily  Start: - Lantus  12 units daily at bedtime and increase the dose by 2 units every 2 days until sugars in am <130 or you get to 36 units  Please come back for a follow-up appointment in 1.5 months.

## 2024-03-01 NOTE — Progress Notes (Signed)
 Patient ID: Brandon Rubio., male   DOB: 10-12-1950, 73 y.o.   MRN: [id number]  HPI  Brandon Rubio. is a 73 y.o.-year-old male, initially referred by his PCP, Dr. Rollene, returning for management of adrenal insufficiency and type 2 diabetes, uncontrolled, insulin -dependent.  Last visit 8 months ago.  Interim history: Patient has a history of lung cancer, with history of immunotherapy, chemotherapy and then radiation treatment which was stopped due to intolerance.  He had immunotherapy and currently undergoing chemotherapy.   Unfortunately, early this year he had another PET CT showing progression of the cancer. He had significant fatigue, weakness, and weight loss during the time of his  ChTx >> much improved after lowering ChTx doses.  He usually takes 8 mg Dexamethasone  x3 days around the time of ChTx.  He has not been off ChTx for 4-5 mo, but will be restarting (the 3rd round). He does have increased risk in urination.  He did notice that sugars were higher lately.  He did not start insulin  as discussed at last visit.  Reviewed history: Patient has a history of lung cancer stage III diagnosed 11/2020.  He was started on ChTx and Immuno Tx.  After 2 ChTx, he got very sick >> admitted. He then tried ChTx (but platelets decreased to low) and RxTx >> very sick >> admitted.  Of note, he got dexamethasone  with his treatments. He completed 31/33 RxTx. This was resumed afterwards.  During both admissions, he was found to have dizziness and orthostatic  hypotension and had a low cortisol, which did not stimulate well with cosyntropin : Component     Latest Ref Rng & Units 05/11/2021 05/12/2021  Cortisol, Base     ug/dL  0.7  Cortisol, 30 Min     ug/dL  8.0  Cortisol, 60 Min     ug/dL  88.3  Cortisol, Plasma     ug/dL 1.1   R793 ACTH     7.2 - 63.3 pg/mL  <1.5 (L)   MRI (05/15/2021); no pituitary mass  An adrenal insufficiency diagnosis was made and he was started on hydrocortisone : -  15 mg in am (7-8 am) - 10 mg in pm (6-7 pm)  At our first visit, I advised him to decrease the dose to: - 10 mg in am - 10 mg in pm  However, he was admitted again afterwards for the dizziness and weakness so we increased his hydrocortisone  back to: - 15 mg in am - 10 mg in pm   In 06/2021, he contacted me with abdominal pain from hydrocortisone  and also increased sweating.  I suggested prednisone : - 7.5 mg daily  09/2022, we decreased the dose of prednisone : - 5 mg daily  In 06/2023, per his preference, we switched to dexamethasone : - 0.75 mg daily  He was previously also on Midodrine , now off.  No h/o po ketoconazole, phenytoin, rifampin, chronic fluconazole use. No h/o autoimmune diseases in pt or family mbs. No excess use of NSAIDs. No h/o generalized infections or HIV. No IVDA. No h/o head injury or severe HA.  At our first visit in 05/2021 he mentioned: - + weight loss - lost 20 lbs in last 1.5 mo - + fatigue - + nausea - no vomiting - + abdominal pain - + mm aches (legs and back) - no palpitations - + mild HAs - + dizziness - + presyncopal episodes (when taking a shower) - + SOB with minimal exertion  No hyponatremia:   Chemistry  Component Value Date/Time   NA 133 (L) 02/16/2024 1033   NA 137 10/24/2022 1540   K 4.1 02/16/2024 1033   CL 99 02/16/2024 1033   CO2 20 (L) 02/16/2024 1033   BUN 16 02/16/2024 1033   BUN 9 10/24/2022 1540   CREATININE 1.35 (H) 02/16/2024 1033      Component Value Date/Time   CALCIUM  8.9 02/16/2024 1033   ALKPHOS 67 02/16/2024 1033   AST 16 02/16/2024 1033   ALT 10 02/16/2024 1033   BILITOT 0.3 02/16/2024 1033     Pt. also has a history of DM2 with history of DKA.  However, this is more controlled lately: Lab Results  Component Value Date   HGBA1C 9.2 (H) 06/29/2023   HGBA1C 6.8 (A) 03/24/2022   HGBA1C 6.1 (H) 05/27/2021   He is on: - Metformin  500 >> 1000 mg 2x a day He was prev. on Lantus  15 units daily >>  stopped due to good control >>  rec'd 10 units daily but did not start He was also previously on  >> stopped due to good control  CBGs: - am: 120-130, occas. 140 >> 220-230 - midday: 240s  He has an elevated ACR: Lab Results  Component Value Date   MICRALBCREAT 46 (H) 06/30/2023   MICRALBCREAT 2.7 09/25/2008   + HL: Lab Results  Component Value Date   CHOL 193 06/29/2023   HDL 62 06/29/2023   LDLCALC 82 06/29/2023   LDLDIRECT 62.0 10/02/2020   TRIG 368 (H) 06/29/2023   CHOLHDL 3.1 06/29/2023  On Zocor  40 mg daily.  Last eye exam: 02/29/2024: No DR reportedly. + glaucoma. Dr. Cleatus.  Last foot exam: 06/29/2023.  He was found to have aortic atherosclerosis on the CT scan from 03/10/2022. He also has a history of pancytopenia. He had COVID-19 in 02/2021.  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   Peripheral neuropathy    toes   Past Surgical History:  Procedure Laterality Date   BRONCHIAL BIOPSY  12/10/2020   Procedure: BRONCHIAL BIOPSIES;  Surgeon: Shelah Lamar RAMAN, MD;  Location: [facility] ENDOSCOPY;  Service: Pulmonary;;   BRONCHIAL BRUSHINGS  12/10/2020   Procedure: BRONCHIAL BRUSHINGS;  Surgeon: Shelah Lamar RAMAN, MD;  Location: [facility] ENDOSCOPY;  Service: Pulmonary;;   BRONCHIAL NEEDLE ASPIRATION BIOPSY  12/10/2020   Procedure: BRONCHIAL NEEDLE ASPIRATION BIOPSIES;  Surgeon: Shelah Lamar RAMAN, MD;  Location: [facility] ENDOSCOPY;  Service: Pulmonary;;   COLONOSCOPY     COLONOSCOPY W/  POLYPECTOMY  03/10/2009   LARYNGOSCOPY  03/10/1997   VIDEO BRONCHOSCOPY WITH ENDOBRONCHIAL NAVIGATION N/A 12/10/2020   Procedure: ROBOTIC VIDEO BRONCHOSCOPY WITH ENDOBRONCHIAL NAVIGATION;  Surgeon: Shelah Lamar RAMAN, MD;  Location: [facility] ENDOSCOPY;  Service: Pulmonary;  Laterality: N/A;   VIDEO BRONCHOSCOPY WITH ENDOBRONCHIAL NAVIGATION Left 12/29/2023   Procedure: VIDEO BRONCHOSCOPY WITH ENDOBRONCHIAL NAVIGATION;  Surgeon: Shelah Lamar RAMAN, MD;  Location: [facility] ENDOSCOPY;  Service:  Pulmonary;  Laterality: Left;   Social History   Socioeconomic History   Marital status: Divorced    Spouse name: Not on file   Number of children: 0   Years of education: Not on file   Highest education level: Not on file  Occupational History   Occupation: Retired personnel officer  Tobacco Use   Smoking status: Every Day    Current packs/day: 1.50    Average packs/day: 1.5 packs/day for 64.0 years (96.0 ttl pk-yrs)    Types: Cigarettes    Start date: 03/10/1960   Smokeless tobacco: Never   Tobacco comments:    1pk daily, 01/04/24 Marcus HERO CMA  Vaping Use   Vaping status: Never Used  Substance and Sexual Activity   Alcohol use: No    Alcohol/week: 0.0 standard drinks of alcohol    Comment:  none since 04/2012   Drug use: Never   Sexual activity: Not Currently  Other Topics Concern   Not on file  Social History Narrative   REG EXERCISE         Social Drivers of Health   Tobacco Use: High Risk (01/04/2024)   Patient History    Smoking Tobacco Use: Every Day    Smokeless Tobacco Use: Never    Passive Exposure: Not on file  Financial Resource Strain: Low Risk (09/17/2023)   Overall Financial Resource Strain (CARDIA)    Difficulty of Paying Living Expenses: Not hard at all  Food Insecurity: No Food Insecurity (09/17/2023)   Epic    Worried About Programme Researcher, Broadcasting/film/video in the Last Year: Never true    Ran Out of Food in the Last Year: Never true  Transportation Needs: No Transportation Needs (09/17/2023)   Epic     Lack of Transportation (Medical): No    Lack of Transportation (Non-Medical): No  Physical Activity: Inactive (09/17/2023)   Exercise Vital Sign    Days of Exercise per Week: 0 days    Minutes of Exercise per Session: 0 min  Stress: No Stress Concern Present (09/17/2023)   [employer] of Occupational Health - Occupational Stress Questionnaire    Feeling of Stress: Only a little  Social Connections: Socially Isolated (09/17/2023)   Social Connection and Isolation Panel    Frequency of Communication with Friends and Family: More than three times a week    Frequency of Social Gatherings with Friends and Family: More than three times a week    Attends Religious Services: Never    Database Administrator or Organizations: No    Attends Banker Meetings: Never    Marital Status: Widowed  Intimate Partner Violence: Not At Risk (09/17/2023)   Epic    Fear of Current or Ex-Partner: No    Emotionally Abused: No    Physically Abused: No    Sexually Abused: No  Depression (PHQ2-9): Medium Risk (11/23/2023)   Depression (PHQ2-9)    PHQ-2 Score: 6  Alcohol Screen: Low Risk (09/17/2023)   Alcohol Screen    Last Alcohol Screening Score (AUDIT): 0  Housing: Unknown (09/17/2023)   Epic    Unable to Pay for Housing in the Last Year: No    Number of Times Moved in the Last Year: Not on file    Homeless in the Last Year: No  Utilities: Not At Risk (09/17/2023)   Epic    Threatened with loss of utilities: No  Health Literacy: Adequate Health Literacy (09/17/2023)   B1300 Health Literacy    Frequency of need  for help with medical instructions: Never   Current Outpatient Medications on File Prior to Visit  Medication Sig Dispense Refill   aspirin  EC 81 MG tablet Take 81 mg by mouth in the morning. Swallow whole.     dexamethasone  (DECADRON ) 0.75 MG tablet TAKE 1 TABLET(0.75 MG) BY MOUTH DAILY 100 tablet 1   HYDROcodone -acetaminophen  (NORCO) 10-325 MG tablet Take 1 tablet by mouth  every 8 (eight) hours as needed. 75 tablet 0   metFORMIN  (GLUCOPHAGE ) 1000 MG tablet TAKE 1 TABLET(1000 MG) BY MOUTH TWICE DAILY WITH A MEAL 180 tablet 0   metoprolol  tartrate (LOPRESSOR ) 25 MG tablet TAKE 1 TABLET(25 MG) BY MOUTH TWICE DAILY 180 tablet 2   omeprazole  (PRILOSEC) 20 MG capsule TAKE 1 CAPSULE BY MOUTH TWICE DAILY BEFORE A MEAL 180 capsule 1   ondansetron  (ZOFRAN ) 8 MG tablet Take 1 tablet (8 mg total) by mouth every 8 (eight) hours as needed for nausea or vomiting. 30 tablet 1   pantoprazole  (PROTONIX ) 40 MG tablet Take 1 tablet (40 mg total) by mouth daily. 90 tablet 3   prochlorperazine  (COMPAZINE ) 10 MG tablet Take 1 tablet (10 mg total) by mouth every 6 (six) hours as needed for nausea or vomiting. 30 tablet 1   simvastatin  (ZOCOR ) 40 MG tablet TAKE 1 TABLET BY MOUTH EVERY EVENING AT 6 PM 90 tablet 0   No current facility-administered medications on file prior to visit.   Allergies  Allergen Reactions   Penicillins Anaphylaxis, Swelling and Other (See Comments)    THE THROAT SWELLS!!!! Because of a history of documented adverse serious drug reaction;[name] Alert bracelet  is recommended   Albumin  (Human) Hives and Itching   Cyramza  [Ramucirumab ] Itching   Family History  Problem Relation Age of Onset   Lung cancer Mother        X 2   Colon polyps Father    Colon cancer Father 45   Coronary artery disease Father    Lung disease Father    Esophageal cancer Neg Hx    Stomach cancer Neg Hx    Rectal cancer Neg Hx    PE: BP 120/60   Pulse 74   Ht 6' 1 (1.854 m)   Wt 206 lb 6.4 oz (93.6 kg)   SpO2 95%   BMI 27.23 kg/m  Wt Readings from Last 15 Encounters:  03/01/24 206 lb 6.4 oz (93.6 kg)  02/16/24 206 lb (93.4 kg)  01/26/24 209 lb (94.8 kg)  01/04/24 202 lb (91.6 kg)  12/29/23 205 lb (93 kg)  12/17/23 209 lb 12.8 oz (95.2 kg)  11/23/23 208 lb (94.3 kg)  10/28/23 210 lb (95.3 kg)  09/17/23 202 lb (91.6 kg)  08/06/23 202 lb (91.6 kg)  07/22/23 189 lb (85.7  kg)  07/12/23 192 lb 7.4 oz (87.3 kg)  07/01/23 202 lb 1.6 oz (91.7 kg)  06/29/23 202 lb 3.2 oz (91.7 kg)  06/10/23 195 lb 3.2 oz (88.5 kg)   Constitutional: overweight, in NAD Eyes:  EOMI, no exophthalmos ENT: no neck masses, no cervical lymphadenopathy Cardiovascular: RRR, No MRG, + B leg swelling - pitting Respiratory: CTA B Musculoskeletal: no deformities Skin:no rashes Neurological: no tremor with outstretched hands  ASSESSMENT: 1. Adrenal insufficiency  2. DM2, insulin -dependent, with complications - Ao atherosclerosis  3. HL  PLAN:  1. Adrenal insufficiency (AI) - Likely due to steroid use (dexamethasone ) during chemotherapy for his lung cancer - His cortisol level did not stimulate well with cosyntropin , consistent with a diagnosis  of adrenal insufficiency.  His ACTH was low, indicating a central etiology.  He was started on hydrocortisone  in the hospital and we continued this.  However, he had significant dizziness, weakness, and disequilibrium despite hydrocortisone  50 mg in a.m. and 10 mg in p.m.  We discussed about decreasing the dose but he could not tolerate a lower dose due to significant weakness.  He had to go to the emergency room for this.  However, the symptoms could have been related to his radiation treatments as he presented with similar symptoms before.  He ended up going back to the previous dose of hydrocortisone  at 50 mg in am and 10 mg in p.m. but developed abdominal pain and swelling so he ended up switching to prednisone  7.5 mg daily, on which he felt better.  Unfortunately, afterwards, he developed decreased appetite with nausea and vomiting and lost a significant amount of weight (approximately 20 pounds).  We discussed about building up his strength by doing weightbearing exercises as I suspected a degree of deconditioning.  He then started to feel much better except for back pain for which she was taking pain medications which were helping.  He was feeling  worse around his chemotherapy sessions.  I did this case with him about gradually decreasing the dose of his prednisone  to 5 mg daily and he agreed to this. -However, afterwards, he received 4 mg of dexamethasone  with his chemotherapy and he contacted the office to see if he can stay on dexamethasone  long-term instead of prednisone .  At last visit, we discussed that dexamethasone  is a longer-acting steroid compared to prednisone  and it is not usually recommended for longer term treatment due to persistently suppressed adrenal function.  He was trying to take the 2 mg of dexamethasone  and I recommended that if we decided to switch to dexamethasone  the corresponding dose would be lower, 0.75 mg daily.  He initially agreed to stay on prednisone  but after the visit he wanted to go to dexamethasone  which he is taking now - 0.75 mg daily.  He tolerates the dose well. -Will continue the same dose of dexamethasone  for now - At today's visit, we reiterated sick day rules: - You absolutely need to take this medication every day and not skip doses. - Please double the dose if you have a fever, for the duration of the fever. - If you cannot take anything by mouth (vomiting) or you have severe diarrhea so that you eliminate the prednisone  pills in your stool, please make sure that you get the steroid in the vein instead - go to the nearest emergency department/urgent care or you may go to your PCPs office  - Please try to get a MedAlert bracelet or pendant indicating: Adrenal insufficiency.  -I previously sent hydrocortisone  solution to his pharmacy for situations when he could not take p.o.  Unfortunately, hydrocortisone  and dexamethasone  were not available and we discussed about going to the nearest emergency room or an urgent care to get steroids parenterally if needed.  I previously offered to send in IM solution of hydrocortisone  to his pharmacy but he preferred to go to urgent care if he could not take p.o.  steroids -I previously recommended 2 meds to get a med alert bracelet or pendant or at least a wallet card mentioning adrenal insufficiency -I plan to see him back in 6 months  2. DM2 - His diabetes control is much worse.  He was seen by Dr. Gatha in the oncology clinic and his blood  sugars were in the 390s so he was advised to call and schedule an appointment with me right away - At last visit he relaxed his diet and was having many sweets and he also had steroids, HbA1c was quite elevated, at 9.2% increased from 6.8%. He was previously on insulin  and SGLT2 inhibitor but these were stopped due to good control. At last visit we continued metformin  1000 mg twice a day.  However, due to the very elevated sugars, I also suggested to add a low-dose of Lantus , 10 units daily at bedtime.  At today's visit, he mentions that he did not obtain it from the pharmacy or started it.  He continues only on metformin . - Sugars are quite elevated.  He cannot remember them well but he knows they are in the 200s most of the time.  We discussed about taking them more consistently.  He declines a CGM.  We will start insulin  at a low dose and increase as needed.  I demonstrated the pen use and correct injection techniques today. - I recommended: Patient Instructions  Please continue Dexamethasone  0.75 mg daily in am.  You absolutely need to take this medication every day and not skip doses.  Please double the dose if you have a fever, for the duration of the fever.  Also, if you have significant other stress like dehydration, diarrhea, or otherwise unexplained weakness.  If you cannot take anything by mouth (vomiting) or you have severe diarrhea so that you eliminate the steroid pills in your stool, please make sure that you get the steroid in the vein instead - go to the nearest emergency department/urgent care or you may go to your PCPs office.  Please get a MedAlert bracelet or pendant indicating: Adrenal  insufficiency.   Please continue: - Metformin  1000 mg x2 daily  Start: - Lantus  12 units daily at bedtime and increase the dose by 2 units every 2 days until sugars in am <130 or you get to 36 units  Please come back for a follow-up appointment in 1.5 months.  - we checked his HbA1c: 10.6% (higher) - advised to check sugars at different times of the day - 1x a day, rotating check times. He needs to check more frequently due to the potential for significant hypoglycemia on insulin . - advised for yearly eye exams >> he is UTD - am ACR was elevated at last visit.  We will need to recheck this at next visit when hopefully diabetes is better controlled. - return to clinic in 1.5 months  3. HL - Reviewed latest lipid panel from last visit and triglycerides were elevated, along with an LDL above target Lab Results  Component Value Date   CHOL 193 06/29/2023   HDL 62 06/29/2023   LDLCALC 82 06/29/2023   LDLDIRECT 62.0 10/02/2020   TRIG 368 (H) 06/29/2023   CHOLHDL 3.1 06/29/2023  - He continues Zocor  40 mg daily -no side effects.  Lela Fendt, MD PhD [facility]

## 2024-03-06 NOTE — Progress Notes (Unsigned)
 [name]  Brandon Rubio LABOR, MD [address] [place] [address]  DIAGNOSIS: Metastatic non-small cell lung cancer initially diagnosed as stage IIIA (T4, N0, M0) non-small cell lung cancer, favoring adenocarcinoma presented with large right lower lobe lung mass with no mediastinal lymphadenopathy or extrathoracic metastasis diagnosed in October 2022. Had disease progression with enlarging mass and new right hilar and mediastinal lymph nodes in December 2022.    Molecular studies by foundation 1 showed no actionable mutations. cMET expression 10% PDL1 TPS 0%  PRIOR THERAPY: 1) Neoadjuvant treatment with carboplatin  for AUC of 5, Alimta  500 Mg/M2 and nivolumab  360 Mg IV every 3 weeks.  First dose January 23, 2021.  Status post 3 cycles. 2) Concurrent chemoradiation with carboplatin  for an AUC of 2 and paclitaxel  45 mg per metered squared.  First dose expected on 04/01/2021.  Status post 4 cycles 3) Consolidation treatment with immunotherapy with Imfinzi  1500 Mg IV every 4 weeks.  First dose on Jul 08, 2021.  Status post 9 cycles.  4) Palliative systemic chemotherapy with carboplatin  for an AUC of 5, Alimta  500 mg/m, Keytruda  200 mg IV every 3 weeks. First dose on 05/13/2022.  Status post 12 cycles.  Starting him cycle #3 his dose of carboplatin  will be reduced to AUC of 4 and Alimta  400 Mg/M2 secondary to intolerance.  Last dose was given January 26, 2023 discontinued secondary to disease progression. 5) Second line systemic chemotherapy with docetaxel  65 Mg/M2 and ramucirumab  10 Mg/KG every 3 weeks with Neulasta  support.  First dose April 08, 2023.  Status post 6  cycles.  His treatment is currently on hold secondary to toxicity and recent hospitalization.  Last dose was given Jul 22, 2023 discontinued secondary to intolerance  CURRENT THERAPY: Gemcitabine  1000 mg/M2 on days 1 and 8 every 3 weeks.  First dose March 08, 2024   INTERVAL  HISTORY: Brandon Rubio. 73 y.o. male returns to the clinic today for a follow-up visit.  The patient was last seen in clinic by Dr. Sherrod on 02/16/2024.  The patient has a history of metastatic non-small cell lung cancer that was initially diagnosed as stage III in October 202.  He was found to have evidence of disease progression and started on chemotherapy in March 2024.  He had some intolerance to chemotherapy with docetaxel  and cyramza  and he has been on observation.  At his recent appointment with Dr. Sherrod, he was found to have progression.  Therefore, today he is scheduled to start chemotherapy back with gemcitabine .  He experiences baseline fatigue and weakness, with mild improvement since starting insulin  for diabetes. He denies new or worsening respiratory symptoms, including cough, hemoptysis, or chest pain. He reports no nausea, vomiting, or acute gastrointestinal symptoms. He has intermittent gastrointestinal gas, managed with simethicone , and is aware of dietary triggers. He denies headaches or new vision changes aside from known cataracts and early macular degeneration.  He prefers earlier infusion appointments due to impaired night vision and is scheduled for bilateral cataract surgery in the coming weeks. Timing of cataract surgery is being coordinated with his chemotherapy schedule, aiming for the off-week or immediately prior to treatment, but not immediately after chemotherapy.  He has longstanding diabetes mellitus with previously poor glycemic control, recently improved after initiating insulin  three to four days ago (current blood glucose 182 mg/dL, previously 699-599 mg/dL). He attributes prior fatigue to hyperglycemia, with some improvement since insulin  initiation. He is followed by endocrinology for diabetes  management.  He has chronic dyspnea attributed to COPD and a significant smoking history. He possesses both maintenance and rescue inhalers but has not used them  recently. He denies current wheezing, cough, or acute respiratory symptoms.   He is here today for evaluation and repeat blood work before undergoing cycle #1.     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  MEDICATIONS:  Current Outpatient Medications  Medication Sig Dispense Refill   aspirin  EC 81 MG tablet Take 81 mg by mouth in the morning. Swallow whole.     dexamethasone  (DECADRON ) 0.75 MG tablet TAKE 1 TABLET(0.75 MG) BY MOUTH DAILY 100 tablet 1   HYDROcodone -acetaminophen  (NORCO) 10-325 MG tablet Take 1 tablet by mouth every 8 (eight) hours as needed. 75 tablet 0   insulin  glargine (LANTUS  SOLOSTAR) 100 UNIT/ML Solostar Pen Inject 15 Units into the skin daily. 15 mL 0   Insulin  Pen Needle 32G X 4 MM MISC Use once a day as directed. 100 each 0   metFORMIN  (GLUCOPHAGE ) 1000 MG tablet Take 1 tablet (1,000 mg total) by mouth 2 (two) times daily with a meal. 180 tablet 3   metoprolol  tartrate (LOPRESSOR ) 25 MG tablet TAKE 1 TABLET(25 MG) BY MOUTH TWICE DAILY 180 tablet 2   omeprazole  (PRILOSEC) 20 MG capsule TAKE 1 CAPSULE BY MOUTH TWICE DAILY BEFORE A MEAL 180 capsule 1   ondansetron  (ZOFRAN ) 8 MG tablet Take 1 tablet (8 mg total) by mouth every 8 (eight) hours as needed for nausea or vomiting. 30 tablet 1   pantoprazole  (PROTONIX ) 40 MG tablet Take 1 tablet (40 mg total) by mouth daily.  90 tablet 3   prochlorperazine  (COMPAZINE ) 10 MG tablet Take 1 tablet (10 mg total) by mouth every 6 (six) hours as needed for nausea or vomiting. 30 tablet 1   simvastatin  (ZOCOR ) 40 MG tablet TAKE 1 TABLET BY MOUTH EVERY EVENING AT 6 PM 90 tablet 0   No current facility-administered medications for this visit.    SURGICAL HISTORY:  Past Surgical History:  Procedure Laterality Date   BRONCHIAL BIOPSY  12/10/2020   Procedure: BRONCHIAL BIOPSIES;  Surgeon: Shelah Lamar RAMAN, MD;  Location: [facility] ENDOSCOPY;  Service: Pulmonary;;   BRONCHIAL BRUSHINGS  12/10/2020   Procedure: BRONCHIAL BRUSHINGS;  Surgeon: Shelah Lamar RAMAN, MD;  Location: [facility] ENDOSCOPY;  Service: Pulmonary;;   BRONCHIAL NEEDLE ASPIRATION BIOPSY  12/10/2020   Procedure: BRONCHIAL NEEDLE ASPIRATION BIOPSIES;  Surgeon: Shelah Lamar RAMAN, MD;  Location: [facility] ENDOSCOPY;  Service: Pulmonary;;   COLONOSCOPY     COLONOSCOPY W/ POLYPECTOMY  03/10/2009   LARYNGOSCOPY  03/10/1997   VIDEO BRONCHOSCOPY WITH ENDOBRONCHIAL NAVIGATION N/A 12/10/2020   Procedure: ROBOTIC VIDEO BRONCHOSCOPY WITH ENDOBRONCHIAL NAVIGATION;  Surgeon: Shelah Lamar RAMAN, MD;  Location: [facility] ENDOSCOPY;  Service: Pulmonary;  Laterality: N/A;   VIDEO BRONCHOSCOPY WITH ENDOBRONCHIAL NAVIGATION Left 12/29/2023   Procedure: VIDEO BRONCHOSCOPY WITH ENDOBRONCHIAL NAVIGATION;  Surgeon: Shelah Lamar RAMAN, MD;  Location: [facility] ENDOSCOPY;  Service: Pulmonary;  Laterality: Left;    REVIEW OF SYSTEMS:   Review of Systems  Constitutional: Chronic fatigue. Negative for appetite change, chills, fever and unexpected weight change.  HENT: Negative for mouth sores, nosebleeds, sore throat and trouble swallowing.   Eyes: Negative for eye problems and icterus.  Respiratory: Stable dyspnea on exertion. Negative for cough, hemoptysis, and wheezing.   Cardiovascular: Negative for chest pain and leg swelling.  Gastrointestinal: Negative for abdominal pain, constipation, diarrhea, nausea and vomiting.   Genitourinary: Negative for bladder incontinence, difficulty urinating, dysuria, frequency and hematuria.   Musculoskeletal: Negative for back pain, gait problem, neck pain and neck stiffness.  Skin: Negative for itching and rash.  Neurological: Negative for dizziness, extremity weakness, gait problem, headaches, light-headedness and seizures.  Hematological: Negative for adenopathy. Does not bruise/bleed easily.  Psychiatric/Behavioral: Negative for confusion, depression and sleep disturbance. The patient is not nervous/anxious.     PHYSICAL EXAMINATION:  There were no vitals taken for this visit.  ECOG PERFORMANCE STATUS: 1  Physical Exam  Constitutional: Oriented to person, place, and time and well-developed, well-nourished, and in no distress.  HENT:  Head: Normocephalic and atraumatic.  Mouth/Throat: Oropharynx is clear and moist. No oropharyngeal exudate.  Eyes: Conjunctivae are normal. Right eye exhibits no discharge. Left eye exhibits no discharge. No scleral icterus.  Neck: Normal range of motion. Neck supple.  Cardiovascular: Normal rate, regular rhythm, normal heart sounds and intact distal pulses.   Pulmonary/Chest: Effort normal. Quiet breath sounds bilaterally. No respiratory distress. No wheezes. No rales.  Abdominal: Soft. Bowel sounds are normal. Exhibits no distension and no mass. There is no tenderness.  Musculoskeletal: Normal range of motion. Exhibits no edema.  Lymphadenopathy:    No cervical adenopathy.  Neurological: Alert and oriented to person, place, and time. Exhibits normal muscle tone. Gait normal. Coordination normal.  Skin: Skin is warm and dry. No rash noted. Not diaphoretic. No erythema. No pallor.  Psychiatric: Mood, memory and judgment normal.  Vitals reviewed.  LABORATORY DATA: Lab Results  Component Value Date   WBC 7.6 02/16/2024   HGB 12.9 (L) 02/16/2024   HCT 37.0 (L) 02/16/2024   MCV 86.9 02/16/2024   PLT 175 02/16/2024       Chemistry      Component Value Date/Time   NA 133 (L) 02/16/2024 1033   NA 137 10/24/2022 1540   K 4.1 02/16/2024 1033   CL 99 02/16/2024 1033   CO2 20 (L) 02/16/2024 1033   BUN 16 02/16/2024 1033   BUN 9 10/24/2022 1540   CREATININE 1.35 (H) 02/16/2024 1033      Component Value Date/Time   CALCIUM  8.9 02/16/2024 1033   ALKPHOS 67 02/16/2024 1033   AST 16 02/16/2024 1033   ALT 10 02/16/2024 1033   BILITOT 0.3 02/16/2024 1033       RADIOGRAPHIC STUDIES:  No results found.   ASSESSMENT/PLAN:  This is a very pleasant 73 year old Caucasian male with recurrent and metastatic non-small cell lung cancer, adenocarcinoma. He was initially diagnosed as a stage IIIa (T4, N1, M0). He presented with a right upper lobe mass and right hilar and mediastinal lymphadenopathy. He was initially diagnosed in October 2022. The patient developed metastatic disease with bilateral pulmonary nodules in January 2024. He does not have any actionable mutations by foundation one.  The patient initially underwent 3 cycles of neoadjuvant systemic chemotherapy with carboplatin , Alimta , nivolumab .  Unfortunately the patient did not have significant improvement in his disease and actually developed disease progression.    He then underwent a course of concurrent chemoradiation.   He is currently on consolidation immunotherapy with Imfinzi  1500 mg IV every 4 weeks and is status post 9 cycles  Therefore, he was started on systemic chemotherapy with carboplatin  for an AUC of 5, Alimta  500 mg/m, and Keytruda  200 mg IV every 3 weeks. He underwent his first dose of treatment on 05/13/22.  He is status post 11 cycles.  His dose of carboplatin  was reduced to an AUC of 5 and Alimta  to 400 mg/m2 starting from cycle #3 due to intolerance.  He started maintenance treatment with dose reduced Alimta  400 mg/m and Keytruda  200 mg IV every 3 weeks starting from cycle #5. This was discontinued due to disease progression.    He  then was on ystemic chemotherapy with reduced dose docetaxel  65 Mg/M2 and ramucirumab  10 Mg/KG every 3 weeks status post 5 cycles. The Neulasta  injection was discontinued secondary to intolerance.  His treatment is currently on hold secondary to toxicity.  He recently had disease progression with repeat bronchoscopy and biopsy of the left lung opacity which is consistent with metastatic adenocarcinoma.   Dr. Sherrod recommended gemcitabine  chemotherapy regimen: two weeks on, one week off. He is expected to start this today on 03/08/24.   Labs were reviewed. He will proceed with cycle #1 today as scheduled.   We will see him back for labs and follow up in 1 week before undergoing day 8 cycle #1.   Type 2 diabetes mellitus Longstanding diabetes with recent improvement in glycemic control following initiation of insulin  therapy. Glycemic control critical during chemotherapy.  Cataracts Scheduled for bilateral cataract surgery. Timing discussed in relation to chemotherapy cycles. Early macular degeneration present. Visual impairment affects driving after dark. - Advised scheduling cataract surgery during the week off from chemotherapy, if possible. - Discussed with him and family the option of scheduling surgery immediately before a chemotherapy treatment if necessary, but not immediately after chemotherapy. - Instructed him to confirm timing with ophthalmologist and coordinate with chemotherapy schedule.   Chronic obstructive pulmonary disease COPD with baseline labored breathing due to prior smoking. No recent exacerbations. Inhaler prescriptions managed by primary care provider. - Encouraged use of inhalers if experiencing wheezing or airway constriction. - Provided education on the role of maintenance and rescue inhalers. - Reinforced that primary care provider manages inhaler prescriptions.   The patient was advised to call immediately if she has any concerning symptoms in the  interval. The patient voices understanding of current disease status and treatment options and is in agreement with the current care plan. All questions were answered. The patient knows to call the clinic with any problems, questions or concerns. We can certainly see the patient much sooner if necessary   No orders of the defined types were placed in this encounter.    The total time spent in the appointment was 20-29 minutes  Schylar Wuebker L Samentha Perham, PA-C 03/06/2024

## 2024-03-08 ENCOUNTER — Inpatient Hospital Stay

## 2024-03-08 ENCOUNTER — Inpatient Hospital Stay (HOSPITAL_BASED_OUTPATIENT_CLINIC_OR_DEPARTMENT_OTHER): Admitting: Physician Assistant

## 2024-03-08 VITALS — BP 101/56 | HR 66 | Temp 97.7°F | Resp 18 | Ht 73.0 in | Wt 207.0 lb

## 2024-03-08 DIAGNOSIS — C3491 Malignant neoplasm of unspecified part of right bronchus or lung: Secondary | ICD-10-CM

## 2024-03-08 DIAGNOSIS — C3490 Malignant neoplasm of unspecified part of unspecified bronchus or lung: Secondary | ICD-10-CM

## 2024-03-08 DIAGNOSIS — Z5111 Encounter for antineoplastic chemotherapy: Secondary | ICD-10-CM

## 2024-03-08 LAB — CBC WITH DIFFERENTIAL (CANCER CENTER ONLY)
Abs Immature Granulocytes: 0.04 K/uL (ref 0.00–0.07)
Basophils Absolute: 0.1 K/uL (ref 0.0–0.1)
Basophils Relative: 1 %
Eosinophils Absolute: 0.2 K/uL (ref 0.0–0.5)
Eosinophils Relative: 2 %
HCT: 37.1 % — ABNORMAL LOW (ref 39.0–52.0)
Hemoglobin: 12.8 g/dL — ABNORMAL LOW (ref 13.0–17.0)
Immature Granulocytes: 0 %
Lymphocytes Relative: 21 %
Lymphs Abs: 2.1 K/uL (ref 0.7–4.0)
MCH: 30.3 pg (ref 26.0–34.0)
MCHC: 34.5 g/dL (ref 30.0–36.0)
MCV: 87.7 fL (ref 80.0–100.0)
Monocytes Absolute: 0.7 K/uL (ref 0.1–1.0)
Monocytes Relative: 7 %
Neutro Abs: 6.5 K/uL (ref 1.7–7.7)
Neutrophils Relative %: 69 %
Platelet Count: 181 K/uL (ref 150–400)
RBC: 4.23 MIL/uL (ref 4.22–5.81)
RDW: 13.8 % (ref 11.5–15.5)
WBC Count: 9.6 K/uL (ref 4.0–10.5)
nRBC: 0 % (ref 0.0–0.2)

## 2024-03-08 LAB — CMP (CANCER CENTER ONLY)
ALT: 11 U/L (ref 0–44)
AST: 16 U/L (ref 15–41)
Albumin: 4 g/dL (ref 3.5–5.0)
Alkaline Phosphatase: 64 U/L (ref 38–126)
Anion gap: 16 — ABNORMAL HIGH (ref 5–15)
BUN: 18 mg/dL (ref 8–23)
CO2: 21 mmol/L — ABNORMAL LOW (ref 22–32)
Calcium: 8.9 mg/dL (ref 8.9–10.3)
Chloride: 101 mmol/L (ref 98–111)
Creatinine: 1.22 mg/dL (ref 0.61–1.24)
GFR, Estimated: >60 mL/min
Glucose, Bld: 182 mg/dL — ABNORMAL HIGH (ref 70–99)
Potassium: 3.9 mmol/L (ref 3.5–5.1)
Sodium: 137 mmol/L (ref 135–145)
Total Bilirubin: 0.4 mg/dL (ref 0.0–1.2)
Total Protein: 7 g/dL (ref 6.5–8.1)

## 2024-03-08 MED ORDER — SODIUM CHLORIDE 0.9 % IV SOLN: INTRAVENOUS | Status: DC

## 2024-03-08 MED ORDER — SODIUM CHLORIDE 0.9 % IV SOLN
1000.0000 mg/m2 | Freq: Once | INTRAVENOUS | Status: AC
  Administered 2024-03-08: 2204 mg via INTRAVENOUS
  Filled 2024-03-08: qty 57.97

## 2024-03-08 MED ORDER — PROCHLORPERAZINE MALEATE 10 MG PO TABS
10.0000 mg | ORAL_TABLET | Freq: Once | ORAL | Status: AC
  Administered 2024-03-08: 10 mg via ORAL
  Filled 2024-03-08: qty 1

## 2024-03-10 ENCOUNTER — Encounter: Payer: Self-pay | Admitting: Internal Medicine

## 2024-03-11 NOTE — Progress Notes (Deleted)
 [facility] OFFICE PROGRESS NOTE  Rollene Almarie LABOR, MD [address] [place] [address]  DIAGNOSIS: Metastatic non-small cell lung cancer initially diagnosed as stage IIIA (T4, N0, M0) non-small cell lung cancer, favoring adenocarcinoma presented with large right lower lobe lung mass with no mediastinal lymphadenopathy or extrathoracic metastasis diagnosed in October 2022. Had disease progression with enlarging mass and new right hilar and mediastinal lymph nodes in December 2022.    Molecular studies by foundation 1 showed no actionable mutations. cMET expression 10% PDL1 TPS 0%  PRIOR THERAPY: 1) Neoadjuvant treatment with carboplatin  for AUC of 5, Alimta  500 Mg/M2 and nivolumab  360 Mg IV every 3 weeks.  First dose January 23, 2021.  Status post 3 cycles. 2) Concurrent chemoradiation with carboplatin  for an AUC of 2 and paclitaxel  45 mg per metered squared.  First dose expected on 04/01/2021.  Status post 4 cycles 3) Consolidation treatment with immunotherapy with Imfinzi  1500 Mg IV every 4 weeks.  First dose on Jul 08, 2021.  Status post 9 cycles.  4) Palliative systemic chemotherapy with carboplatin  for an AUC of 5, Alimta  500 mg/m, Keytruda  200 mg IV every 3 weeks. First dose on 05/13/2022.  Status post 12 cycles.  Starting him cycle #3 his dose of carboplatin  will be reduced to AUC of 4 and Alimta  400 Mg/M2 secondary to intolerance.  Last dose was given January 26, 2023 discontinued secondary to disease progression. 5) Second line systemic chemotherapy with docetaxel  65 Mg/M2 and ramucirumab  10 Mg/KG every 3 weeks with Neulasta  support.  First dose April 08, 2023.  Status post 6  cycles.  His treatment is currently on hold secondary to toxicity and recent hospitalization.  Last dose was given Jul 22, 2023 discontinued secondary to intolerance  CURRENT THERAPY: Gemcitabine  1000 mg/M2 on days 1 and 8 every 3 weeks.  First dose March 08, 2024. Status post day 1  cycle #1.   INTERVAL HISTORY: Brandon Rubio. 74 y.o. male returns returns to the clinic today for a follow-up visit. He was last seen in the clinic by myself on 03/08/24.    The patient has a history of metastatic non-small cell lung cancer that was initially diagnosed as stage III in October 202.  He was found to have evidence of disease progression and started on chemotherapy in March 2024.  He had some intolerance to chemotherapy with docetaxel  and cyramza  and he has been on observation.   At his recent appointment with Dr. Sherrod, he was found to have progression.  Therefore, he was started back on chemotherapy with gemcitabine .   He is status post day 1 cycle #1. He tolerated it ***.  He experiences baseline fatigue and weakness which *** after treatment.   He denies new or worsening respiratory symptoms, including cough, hemoptysis, or chest pain. He reports no nausea, vomiting, or acute gastrointestinal symptoms. He has intermittent gastrointestinal gas, managed with simethicone , and is aware of dietary triggers. He denies headaches or new vision changes aside from known cataracts and early macular degeneration.   He is scheduled for cataract surgery on ***.   He is followed by endocrinology for diabetes management.   He has chronic dyspnea attributed to COPD and a smoking history. He possesses both maintenance and rescue inhalers but has not used them recently. He denies current wheezing, cough, or acute respiratory symptoms.    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  MEDICATIONS:  Current Outpatient Medications  Medication Sig Dispense Refill   aspirin  EC 81 MG tablet Take 81 mg by mouth in the morning. Swallow whole.     dexamethasone  (DECADRON ) 0.75 MG tablet TAKE 1 TABLET(0.75 MG) BY MOUTH DAILY 100 tablet 1   HYDROcodone -acetaminophen  (NORCO) 10-325 MG tablet Take 1 tablet by mouth every 8 (eight) hours as needed. 75 tablet 0   insulin  glargine (LANTUS  SOLOSTAR) 100 UNIT/ML Solostar Pen Inject 15 Units into the skin daily. 15 mL 0   Insulin  Pen Needle 32G X 4 MM MISC Use once a day as directed. 100 each 0   magnesium  oxide (MAG-OX) 400 (240 Mg) MG tablet Take 1 tablet by mouth 2 (two) times daily.     metFORMIN  (GLUCOPHAGE ) 1000 MG tablet Take 1 tablet (1,000 mg total) by mouth 2 (two) times daily with a meal. 180 tablet 3   metoprolol  tartrate (LOPRESSOR ) 25 MG tablet TAKE 1 TABLET(25 MG) BY MOUTH TWICE DAILY 180 tablet 2   omeprazole  (PRILOSEC) 20 MG capsule TAKE 1 CAPSULE BY MOUTH TWICE DAILY BEFORE A MEAL 180 capsule 1   ondansetron  (ZOFRAN ) 8 MG tablet Take 1 tablet (8 mg total) by mouth every 8 (eight) hours as needed for nausea or vomiting. 30 tablet 1   pantoprazole  (PROTONIX ) 40 MG tablet Take 1 tablet (40 mg total) by mouth daily. 90 tablet 3   prochlorperazine  (COMPAZINE ) 10 MG tablet Take 1 tablet (10 mg total) by mouth every 6 (six) hours as needed for nausea or vomiting. 30 tablet 1   simvastatin  (ZOCOR ) 40 MG tablet TAKE 1 TABLET BY MOUTH EVERY EVENING AT 6 PM 90 tablet 0   No current facility-administered medications for this visit.    SURGICAL HISTORY:  Past Surgical History:  Procedure Laterality Date   BRONCHIAL BIOPSY  12/10/2020    Procedure: BRONCHIAL BIOPSIES;  Surgeon: Shelah Lamar RAMAN, MD;  Location: [facility] ENDOSCOPY;  Service: Pulmonary;;   BRONCHIAL BRUSHINGS  12/10/2020   Procedure: BRONCHIAL BRUSHINGS;  Surgeon: Shelah Lamar RAMAN, MD;  Location: [facility] ENDOSCOPY;  Service: Pulmonary;;   BRONCHIAL NEEDLE ASPIRATION BIOPSY  12/10/2020   Procedure: BRONCHIAL NEEDLE ASPIRATION BIOPSIES;  Surgeon: Shelah Lamar RAMAN, MD;  Location: [facility] ENDOSCOPY;  Service: Pulmonary;;   COLONOSCOPY     COLONOSCOPY W/ POLYPECTOMY  03/10/2009   LARYNGOSCOPY  03/10/1997   VIDEO BRONCHOSCOPY WITH ENDOBRONCHIAL NAVIGATION N/A 12/10/2020   Procedure: ROBOTIC VIDEO BRONCHOSCOPY WITH ENDOBRONCHIAL NAVIGATION;  Surgeon: Shelah Lamar RAMAN, MD;  Location: [facility] ENDOSCOPY;  Service: Pulmonary;  Laterality: N/A;   VIDEO BRONCHOSCOPY WITH ENDOBRONCHIAL NAVIGATION Left 12/29/2023   Procedure: VIDEO BRONCHOSCOPY WITH ENDOBRONCHIAL NAVIGATION;  Surgeon: Shelah Lamar RAMAN, MD;  Location: [facility] ENDOSCOPY;  Service: Pulmonary;  Laterality: Left;  REVIEW OF SYSTEMS:   Review of Systems  Constitutional: Negative for appetite change, chills, fatigue, fever and unexpected weight change.  HENT:   Negative for mouth sores, nosebleeds, sore throat and trouble swallowing.   Eyes: Negative for eye problems and icterus.  Respiratory: Negative for cough, hemoptysis, shortness of breath and wheezing.   Cardiovascular: Negative for chest pain and leg swelling.  Gastrointestinal: Negative for abdominal pain, constipation, diarrhea, nausea and vomiting.  Genitourinary: Negative for bladder incontinence, difficulty urinating, dysuria, frequency and hematuria.   Musculoskeletal: Negative for back pain, gait problem, neck pain and neck stiffness.  Skin: Negative for itching and rash.  Neurological: Negative for dizziness, extremity weakness, gait problem, headaches, light-headedness and seizures.  Hematological: Negative for adenopathy. Does not bruise/bleed easily.  Psychiatric/Behavioral:  Negative for confusion, depression and sleep disturbance. The patient is not nervous/anxious.     PHYSICAL EXAMINATION:  There were no vitals taken for this visit.  ECOG PERFORMANCE STATUS: {CHL ONC ECOG [id number]  Physical Exam  Constitutional: Oriented to person, place, and time and well-developed, well-nourished, and in no distress. No distress.  HENT:  Head: Normocephalic and atraumatic.  Mouth/Throat: Oropharynx is clear and moist. No oropharyngeal exudate.  Eyes: Conjunctivae are normal. Right eye exhibits no discharge. Left eye exhibits no discharge. No scleral icterus.  Neck: Normal range of motion. Neck supple.  Cardiovascular: Normal rate, regular rhythm, normal heart sounds and intact distal pulses.   Pulmonary/Chest: Effort normal and breath sounds normal. No respiratory distress. No wheezes. No rales.  Abdominal: Soft. Bowel sounds are normal. Exhibits no distension and no mass. There is no tenderness.  Musculoskeletal: Normal range of motion. Exhibits no edema.  Lymphadenopathy:    No cervical adenopathy.  Neurological: Alert and oriented to person, place, and time. Exhibits normal muscle tone. Gait normal. Coordination normal.  Skin: Skin is warm and dry. No rash noted. Not diaphoretic. No erythema. No pallor.  Psychiatric: Mood, memory and judgment normal.  Vitals reviewed.  LABORATORY DATA: Lab Results  Component Value Date   WBC 9.6 03/08/2024   HGB 12.8 (L) 03/08/2024   HCT 37.1 (L) 03/08/2024   MCV 87.7 03/08/2024   PLT 181 03/08/2024      Chemistry      Component Value Date/Time   NA 137 03/08/2024 0826   NA 137 10/24/2022 1540   K 3.9 03/08/2024 0826   CL 101 03/08/2024 0826   CO2 21 (L) 03/08/2024 0826   BUN 18 03/08/2024 0826   BUN 9 10/24/2022 1540   CREATININE 1.22 03/08/2024 0826      Component Value Date/Time   CALCIUM  8.9 03/08/2024 0826   ALKPHOS 64 03/08/2024 0826   AST 16 03/08/2024 0826   ALT 11 03/08/2024 0826   BILITOT 0.4  03/08/2024 0826       RADIOGRAPHIC STUDIES:  No results found.   ASSESSMENT/PLAN:  This is a very pleasant 74 year old Caucasian male with recurrent and metastatic non-small cell lung cancer, adenocarcinoma. He was initially diagnosed as a stage IIIa (T4, N1, M0). He presented with a right upper lobe mass and right hilar and mediastinal lymphadenopathy. He was initially diagnosed in October 2022. The patient developed metastatic disease with bilateral pulmonary nodules in January 2024. He does not have any actionable mutations by foundation one.    The patient initially underwent 3 cycles of neoadjuvant systemic chemotherapy with carboplatin , Alimta , nivolumab .  Unfortunately the patient did not have significant improvement in his disease and actually developed disease progression.  He then underwent a course of concurrent chemoradiation.   He is currently on consolidation immunotherapy with Imfinzi  1500 mg IV every 4 weeks and is status post 9 cycles   Therefore, he was started on systemic chemotherapy with carboplatin  for an AUC of 5, Alimta  500 mg/m, and Keytruda  200 mg IV every 3 weeks. He underwent his first dose of treatment on 05/13/22.  He is status post 11 cycles.  His dose of carboplatin  was reduced to an AUC of 5 and Alimta  to 400 mg/m2 starting from cycle #3 due to intolerance.  He started maintenance treatment with dose reduced Alimta  400 mg/m and Keytruda  200 mg IV every 3 weeks starting from cycle #5. This was discontinued due to disease progression.   He then was on ystemic chemotherapy with reduced dose docetaxel  65 Mg/M2 and ramucirumab  10 Mg/KG every 3 weeks status post 5 cycles. The Neulasta  injection was discontinued secondary to intolerance.  His treatment is currently on hold secondary to toxicity.  He recently had disease progression with repeat bronchoscopy and biopsy of the left lung opacity which is consistent with metastatic adenocarcinoma.    Dr. Sherrod  recommended gemcitabine  chemotherapy regimen: two weeks on, one week off. He is expected to start this today on 03/08/24. He is status post day 1 cycle #1.   Labs were reviewed. He will *** with cycle #1 day 8 today as scheduled.    ***Cataract  Chronic obstructive pulmonary disease COPD with baseline labored breathing due to prior smoking. No recent exacerbations. Inhaler prescriptions managed by primary care provider. - Encouraged use of inhalers if experiencing wheezing or airway constriction. - Provided education on the role of maintenance and rescue inhalers. - Reinforced that primary care provider manages inhaler prescriptions.  The patient was advised to call immediately if he has any concerning symptoms in the interval. The patient voices understanding of current disease status and treatment options and is in agreement with the current care plan. All questions were answered. The patient knows to call the clinic with any problems, questions or concerns. We can certainly see the patient much sooner if necessary     No orders of the defined types were placed in this encounter.    I spent {CHL ONC TIME VISIT - DTPQU:[id number]} counseling the patient face to face. The total time spent in the appointment was {CHL ONC TIME VISIT - DTPQU:[id number]}.  Brandon Rubio L Jacques Willingham, PA-C 03/11/2024

## 2024-03-14 ENCOUNTER — Other Ambulatory Visit: Payer: Self-pay | Admitting: Internal Medicine

## 2024-03-14 NOTE — Telephone Encounter (Signed)
 Copied from CRM [id number]. Topic: Clinical - Medication Refill >> Mar 14, 2024  9:53 AM Aleatha C wrote: Medication: HYDROcodone -acetaminophen  (NORCO) 10-325 MG tablet  Has the patient contacted their pharmacy? No (Agent: If no, request that the patient contact the pharmacy for the refill. If patient does not wish to contact the pharmacy document the reason why and proceed with request.) (Agent: If yes, when and what did the pharmacy advise?)  This is the patient's preferred pharmacy:  [id number] [address], [place] - [address] AT [facility] [address] [facility] [address] [place] [address] Phone: [phone] Fax: [phone]  Is this the correct pharmacy for this prescription? Yes If no, delete pharmacy and type the correct one.   Has the prescription been filled recently? No  Is the patient out of the medication? No  Has the patient been seen for an appointment in the last year OR does the patient have an upcoming appointment? Yes  Can we respond through MyChart? No  Agent: Please be advised that Rx refills may take up to 3 business days. We ask that you follow-up with your pharmacy.

## 2024-03-15 ENCOUNTER — Inpatient Hospital Stay: Admitting: Physician Assistant

## 2024-03-15 ENCOUNTER — Inpatient Hospital Stay

## 2024-03-15 ENCOUNTER — Inpatient Hospital Stay: Attending: Internal Medicine

## 2024-03-15 VITALS — BP 141/78 | HR 65 | Temp 98.2°F | Resp 18 | Wt 208.8 lb

## 2024-03-15 DIAGNOSIS — C3491 Malignant neoplasm of unspecified part of right bronchus or lung: Secondary | ICD-10-CM

## 2024-03-15 DIAGNOSIS — Z79899 Other long term (current) drug therapy: Secondary | ICD-10-CM | POA: Insufficient documentation

## 2024-03-15 DIAGNOSIS — R21 Rash and other nonspecific skin eruption: Secondary | ICD-10-CM

## 2024-03-15 DIAGNOSIS — Z923 Personal history of irradiation: Secondary | ICD-10-CM | POA: Diagnosis not present

## 2024-03-15 DIAGNOSIS — C3431 Malignant neoplasm of lower lobe, right bronchus or lung: Secondary | ICD-10-CM | POA: Insufficient documentation

## 2024-03-15 DIAGNOSIS — Z5112 Encounter for antineoplastic immunotherapy: Secondary | ICD-10-CM | POA: Diagnosis present

## 2024-03-15 DIAGNOSIS — Z9221 Personal history of antineoplastic chemotherapy: Secondary | ICD-10-CM | POA: Diagnosis not present

## 2024-03-15 LAB — CMP (CANCER CENTER ONLY)
ALT: 18 U/L (ref 0–44)
AST: 20 U/L (ref 15–41)
Albumin: 3.9 g/dL (ref 3.5–5.0)
Alkaline Phosphatase: 73 U/L (ref 38–126)
Anion gap: 15 (ref 5–15)
BUN: 20 mg/dL (ref 8–23)
CO2: 21 mmol/L — ABNORMAL LOW (ref 22–32)
Calcium: 8.8 mg/dL — ABNORMAL LOW (ref 8.9–10.3)
Chloride: 101 mmol/L (ref 98–111)
Creatinine: 1.2 mg/dL (ref 0.61–1.24)
GFR, Estimated: >60 mL/min
Glucose, Bld: 229 mg/dL — ABNORMAL HIGH (ref 70–99)
Potassium: 3.9 mmol/L (ref 3.5–5.1)
Sodium: 137 mmol/L (ref 135–145)
Total Bilirubin: 0.3 mg/dL (ref 0.0–1.2)
Total Protein: 6.9 g/dL (ref 6.5–8.1)

## 2024-03-15 LAB — CBC WITH DIFFERENTIAL (CANCER CENTER ONLY)
Abs Immature Granulocytes: 0.04 K/uL (ref 0.00–0.07)
Basophils Absolute: 0.1 K/uL (ref 0.0–0.1)
Basophils Relative: 1 %
Eosinophils Absolute: 0.1 K/uL (ref 0.0–0.5)
Eosinophils Relative: 1 %
HCT: 35.1 % — ABNORMAL LOW (ref 39.0–52.0)
Hemoglobin: 12.3 g/dL — ABNORMAL LOW (ref 13.0–17.0)
Immature Granulocytes: 1 %
Lymphocytes Relative: 29 %
Lymphs Abs: 1.7 K/uL (ref 0.7–4.0)
MCH: 30.4 pg (ref 26.0–34.0)
MCHC: 35 g/dL (ref 30.0–36.0)
MCV: 86.7 fL (ref 80.0–100.0)
Monocytes Absolute: 0.5 K/uL (ref 0.1–1.0)
Monocytes Relative: 9 %
Neutro Abs: 3.5 K/uL (ref 1.7–7.7)
Neutrophils Relative %: 59 %
Platelet Count: 111 K/uL — ABNORMAL LOW (ref 150–400)
RBC: 4.05 MIL/uL — ABNORMAL LOW (ref 4.22–5.81)
RDW: 13.3 % (ref 11.5–15.5)
WBC Count: 5.8 K/uL (ref 4.0–10.5)
nRBC: 0.3 % — ABNORMAL HIGH (ref 0.0–0.2)

## 2024-03-15 MED ORDER — SODIUM CHLORIDE 0.9 % IV SOLN: INTRAVENOUS | Status: DC

## 2024-03-15 MED ORDER — NYSTATIN 100000 UNIT/GM EX POWD: 1.0000 | Freq: Three times a day (TID) | CUTANEOUS | 0 refills | Status: AC

## 2024-03-15 MED ORDER — FAMOTIDINE IN NACL 20-0.9 MG/50ML-% IV SOLN
20.0000 mg | Freq: Once | INTRAVENOUS | Status: AC
  Administered 2024-03-15: 20 mg via INTRAVENOUS
  Filled 2024-03-15: qty 50

## 2024-03-15 MED ORDER — HYDROCORTISONE 0.5 % EX CREA: 1.0000 | TOPICAL_CREAM | Freq: Two times a day (BID) | CUTANEOUS | 0 refills | Status: AC

## 2024-03-15 MED ORDER — PROCHLORPERAZINE MALEATE 10 MG PO TABS
10.0000 mg | ORAL_TABLET | Freq: Once | ORAL | Status: AC
  Administered 2024-03-15: 10 mg via ORAL
  Filled 2024-03-15: qty 1

## 2024-03-15 MED ORDER — SODIUM CHLORIDE 0.9 % IV SOLN
1000.0000 mg/m2 | Freq: Once | INTRAVENOUS | Status: AC
  Administered 2024-03-15: 2204 mg via INTRAVENOUS
  Filled 2024-03-15: qty 57.97

## 2024-03-18 MED ORDER — HYDROCODONE-ACETAMINOPHEN 10-325 MG PO TABS: 1.0000 | ORAL_TABLET | Freq: Three times a day (TID) | ORAL | 0 refills | Status: DC | PRN

## 2024-03-18 NOTE — Telephone Encounter (Unsigned)
 Copied from CRM [id number]. Topic: Clinical - Medication Question >> Mar 18, 2024  9:33 AM Chasity T wrote: Reason for CRM: patient is calling to ask if Dr Rollene can send over his prescription for HYDROcodone -acetaminophen  in today. Request was put in 03/14/24.

## 2024-03-22 ENCOUNTER — Other Ambulatory Visit: Payer: Self-pay | Admitting: Internal Medicine

## 2024-03-22 DIAGNOSIS — C3491 Malignant neoplasm of unspecified part of right bronchus or lung: Secondary | ICD-10-CM

## 2024-03-22 NOTE — Progress Notes (Signed)
 [facility] OFFICE PROGRESS NOTE  Rollene Almarie LABOR, MD [address] [place] [address]  DIAGNOSIS: Metastatic non-small cell lung cancer initially diagnosed as stage IIIA (T4, N0, M0) non-small cell lung cancer, favoring adenocarcinoma presented with large right lower lobe lung mass with no mediastinal lymphadenopathy or extrathoracic metastasis diagnosed in October 2022. Had disease progression with enlarging mass and new right hilar and mediastinal lymph nodes in December 2022.    Molecular studies by foundation 1 showed no actionable mutations. cMET expression 10% PDL1 TPS 0%  PRIOR THERAPY: 1) Neoadjuvant treatment with carboplatin  for AUC of 5, Alimta  500 Mg/M2 and nivolumab  360 Mg IV every 3 weeks.  First dose January 23, 2021.  Status post 3 cycles. 2) Concurrent chemoradiation with carboplatin  for an AUC of 2 and paclitaxel  45 mg per metered squared.  First dose expected on 04/01/2021.  Status post 4 cycles 3) Consolidation treatment with immunotherapy with Imfinzi  1500 Mg IV every 4 weeks.  First dose on Jul 08, 2021.  Status post 9 cycles.  4) Palliative systemic chemotherapy with carboplatin  for an AUC of 5, Alimta  500 mg/m, Keytruda  200 mg IV every 3 weeks. First dose on 05/13/2022.  Status post 12 cycles.  Starting him cycle #3 his dose of carboplatin  will be reduced to AUC of 4 and Alimta  400 Mg/M2 secondary to intolerance.  Last dose was given January 26, 2023 discontinued secondary to disease progression. 5) Second line systemic chemotherapy with docetaxel  65 Mg/M2 and ramucirumab  10 Mg/KG every 3 weeks with Neulasta  support.  First dose April 08, 2023.  Status post 6  cycles.  His treatment is currently on hold secondary to toxicity and recent hospitalization.  Last dose was given Jul 22, 2023 discontinued secondary to intolerance  CURRENT THERAPY: Gemcitabine  1000 mg/M2 on days 1 and 8 every 3 weeks. First dose March 08, 2024. Status post  cycle  INTERVAL HISTORY: Brandon Rubio. 74 y.o. male returns to the clinic today for a follow-up visit.  The patient was last seen in clinic by myself on 03/08/24. The patient has a history of metastatic non-small cell lung cancer that was initially diagnosed as stage III in October 202.  He was found to have evidence of disease progression and started on chemotherapy in March 2024.  He had some intolerance to chemotherapy with docetaxel  and cyramza  and he has been on observation.   At his recent appointment with Dr. Sherrod in early December, he was found to have progression.  Therefore, today he is scheduled to start chemotherapy back with gemcitabine .  He is status post his first cycle and tolerated it well. He previously experienced a rash, which has improved. He has not yet received the prescribed hydrocortisone  cream from the pharmacy and uses over-the-counter cortisone cream for poison ivy as needed. No new or worsening dermatologic symptoms are reported.   He has been receiving cancer therapy for over three years and is currently undergoing infusions. He denies any issues with his most recent treatment. He experiences chronic fatigue and weakness, which are stable. He has a persistent cough present most of the day, associated with phlegm production but without hemoptysis or abnormal sputum coloration. He denies fevers, chills, chest pain, or new respiratory symptoms. Exercise tolerance remains limited, requiring rest after walking short distances, though he notes some improvement with increased activity. He denies nausea or vomiting. Diarrhea occurred after the first treatment but has not recurred, and bowel habits are otherwise unchanged. He describes burning  at the infusion site during recent treatments, and reports that the use of hot packs helped relieve the discomfort.  He has cataracts but has not scheduled surgery, as they are not considered severe. He attributes some visual changes  to previously elevated blood glucose, which has since improved.  His most recent blood glucose was 149 mg/dL, improved from prior values as high as 391 mg/dL. He uses hard candy to help with cough, which impacts his blood glucose control.   He is here today for evaluation and repeat blood work before undergoing cycle #2.   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  MEDICATIONS:  Current Outpatient Medications  Medication Sig Dispense Refill   aspirin  EC 81 MG tablet Take 81 mg by mouth in the morning. Swallow whole.     dexamethasone  (DECADRON ) 0.75 MG tablet TAKE 1 TABLET(0.75 MG) BY MOUTH DAILY 100 tablet 1   HYDROcodone -acetaminophen  (NORCO) 10-325 MG tablet Take 1 tablet by mouth every 8 (eight) hours as needed. 75 tablet 0   hydrocortisone  cream 0.5 % Apply 1 Application topically 2 (two) times daily. 30 g 0   insulin  glargine (LANTUS  SOLOSTAR) 100 UNIT/ML Solostar Pen Inject 15 Units into the skin daily. 15 mL 0   Insulin  Pen Needle 32G X 4 MM MISC Use once a day as directed. 100 each 0   magnesium  oxide (MAG-OX) 400 (240 Mg) MG tablet Take 1 tablet by mouth 2 (two) times daily.     metFORMIN  (GLUCOPHAGE ) 1000 MG tablet Take 1 tablet (1,000 mg total) by mouth 2 (two) times daily with a meal. 180 tablet 3   metoprolol  tartrate (LOPRESSOR ) 25 MG tablet TAKE 1 TABLET(25 MG) BY MOUTH TWICE  DAILY 180 tablet 2   nystatin  (MYCOSTATIN /NYSTOP ) powder Apply 1 Application topically 3 (three) times daily. 15 g 0   omeprazole  (PRILOSEC) 20 MG capsule TAKE 1 CAPSULE BY MOUTH TWICE DAILY BEFORE A MEAL 180 capsule 1   ondansetron  (ZOFRAN ) 8 MG tablet Take 1 tablet (8 mg total) by mouth every 8 (eight) hours as needed for nausea or vomiting. 30 tablet 1   pantoprazole  (PROTONIX ) 40 MG tablet Take 1 tablet (40 mg total) by mouth daily. 90 tablet 3   prochlorperazine  (COMPAZINE ) 10 MG tablet Take 1 tablet (10 mg total) by mouth every 6 (six) hours as needed for nausea or vomiting. 30 tablet 1   simvastatin  (ZOCOR ) 40 MG tablet TAKE 1 TABLET BY MOUTH EVERY EVENING AT 6 PM 90 tablet 0   No current facility-administered medications for this visit.    SURGICAL HISTORY:  Past Surgical History:  Procedure Laterality Date   BRONCHIAL BIOPSY  12/10/2020   Procedure: BRONCHIAL BIOPSIES;  Surgeon: Shelah Lamar RAMAN, MD;  Location: [facility] ENDOSCOPY;  Service: Pulmonary;;   BRONCHIAL BRUSHINGS  12/10/2020   Procedure: BRONCHIAL BRUSHINGS;  Surgeon: Shelah Lamar RAMAN, MD;  Location: [facility] ENDOSCOPY;  Service: Pulmonary;;   BRONCHIAL NEEDLE ASPIRATION BIOPSY  12/10/2020   Procedure: BRONCHIAL NEEDLE ASPIRATION BIOPSIES;  Surgeon: Shelah Lamar RAMAN, MD;  Location: [facility] ENDOSCOPY;  Service: Pulmonary;;   COLONOSCOPY     COLONOSCOPY W/ POLYPECTOMY  03/10/2009   LARYNGOSCOPY  03/10/1997   VIDEO BRONCHOSCOPY WITH ENDOBRONCHIAL NAVIGATION N/A 12/10/2020   Procedure: ROBOTIC VIDEO BRONCHOSCOPY WITH ENDOBRONCHIAL NAVIGATION;  Surgeon: Shelah Lamar RAMAN, MD;  Location: [facility] ENDOSCOPY;  Service: Pulmonary;  Laterality: N/A;   VIDEO BRONCHOSCOPY WITH ENDOBRONCHIAL NAVIGATION Left 12/29/2023   Procedure: VIDEO BRONCHOSCOPY WITH ENDOBRONCHIAL NAVIGATION;  Surgeon: Shelah Lamar RAMAN, MD;  Location: [facility] ENDOSCOPY;  Service: Pulmonary;  Laterality: Left;    REVIEW OF SYSTEMS:   Review of Systems  Constitutional: Chronic fatigue.  Negative for appetite change, chills, fever and unexpected weight change.  HENT: Negative for mouth sores, nosebleeds, sore throat and trouble swallowing.   Eyes: Negative for eye problems and icterus.  Respiratory: Stable dyspnea on exertion and occasional cough. Negative for  hemoptysis, and wheezing.   Cardiovascular: Negative for chest pain and leg swelling.  Gastrointestinal: Negative for abdominal pain, constipation, diarrhea, nausea and vomiting.  Genitourinary: Negative for bladder incontinence, difficulty urinating, dysuria, frequency and hematuria.   Musculoskeletal: Negative for back pain, gait problem, neck pain and neck stiffness.  Skin: Negative for itching and rash.  Neurological: Negative for dizziness, extremity weakness, gait problem, headaches, light-headedness and seizures.  Hematological: Negative for adenopathy. Does not bruise/bleed easily.  Psychiatric/Behavioral: Negative for confusion, depression and sleep disturbance. The patient is not nervous/anxious.     PHYSICAL EXAMINATION:  Blood pressure 110/63, pulse 63, temperature 98.6 F (37 C), temperature source Temporal, resp. rate 17, height 6' 1 (1.854 m), weight 212 lb (96.2 kg), SpO2 97%.  ECOG PERFORMANCE STATUS: 1  Physical Exam  Constitutional: Oriented to person, place, and time and well-developed, well-nourished, and in no distress.  HENT:  Head: Normocephalic and atraumatic.  Mouth/Throat: Oropharynx is clear and moist. No oropharyngeal exudate.  Eyes: Conjunctivae are normal. Right eye exhibits no discharge. Left eye exhibits no discharge. No scleral icterus.  Neck: Normal range of motion. Neck supple.  Cardiovascular: Normal rate, regular rhythm, normal heart sounds and intact distal pulses.   Pulmonary/Chest: Effort normal. Quiet breath sounds bilaterally. No respiratory distress. No wheezes. No rales.  Abdominal: Soft. Bowel sounds are normal. Exhibits no distension and no mass. There is no  tenderness.  Musculoskeletal: Normal range of motion. Exhibits no edema.  Lymphadenopathy:    No cervical adenopathy.  Neurological: Alert and oriented to person, place, and time. Exhibits normal muscle tone. Gait normal. Coordination normal.  Skin: Skin is warm and dry. No rash noted. Not diaphoretic. No erythema. No pallor.  Psychiatric: Mood, memory and judgment normal.  Vitals reviewed.  LABORATORY DATA: Lab Results  Component Value Date   WBC 7.2 03/29/2024   HGB 11.4 (L) 03/29/2024   HCT 32.9 (L) 03/29/2024   MCV 88.0 03/29/2024   PLT 219 03/29/2024      Chemistry      Component Value Date/Time   NA 137 03/29/2024 0738   NA 137 10/24/2022 1540   K 4.0 03/29/2024 0738   CL 101 03/29/2024 0738   CO2 21 (L) 03/29/2024 0738   BUN 14 03/29/2024 0738   BUN 9 10/24/2022 1540   CREATININE 1.10 03/29/2024 0738      Component Value Date/Time  CALCIUM  8.6 (L) 03/29/2024 0738   ALKPHOS 68 03/29/2024 0738   AST 18 03/29/2024 0738   ALT 13 03/29/2024 0738   BILITOT 0.3 03/29/2024 0738       RADIOGRAPHIC STUDIES:  No results found.   ASSESSMENT/PLAN:  This is a very pleasant 74 year old Caucasian male with recurrent and metastatic non-small cell lung cancer, adenocarcinoma. He was initially diagnosed as a stage IIIa (T4, N1, M0). He presented with a right upper lobe mass and right hilar and mediastinal lymphadenopathy. He was initially diagnosed in October 2022. The patient developed metastatic disease with bilateral pulmonary nodules in January 2024. He does not have any actionable mutations by foundation one.    The patient initially underwent 3 cycles of neoadjuvant systemic chemotherapy with carboplatin , Alimta , nivolumab .  Unfortunately the patient did not have significant improvement in his disease and actually developed disease progression.    He then underwent a course of concurrent chemoradiation.   He is currently on consolidation immunotherapy with Imfinzi  1500  mg IV every 4 weeks and is status post 9 cycles   Therefore, he was started on systemic chemotherapy with carboplatin  for an AUC of 5, Alimta  500 mg/m, and Keytruda  200 mg IV every 3 weeks. He underwent his first dose of treatment on 05/13/22.  He is status post 11 cycles.  His dose of carboplatin  was reduced to an AUC of 5 and Alimta  to 400 mg/m2 starting from cycle #3 due to intolerance.  He started maintenance treatment with dose reduced Alimta  400 mg/m and Keytruda  200 mg IV every 3 weeks starting from cycle #5. This was discontinued due to disease progression.    He then was on ystemic chemotherapy with reduced dose docetaxel  65 Mg/M2 and ramucirumab  10 Mg/KG every 3 weeks status post 5 cycles. The Neulasta  injection was discontinued secondary to intolerance.  His treatment is currently on hold secondary to toxicity.   He recently had disease progression with repeat bronchoscopy and biopsy of the left lung opacity which is consistent with metastatic adenocarcinoma.    Dr. Sherrod recommended gemcitabine  chemotherapy regimen: He started this on 03/08/2024.  He status post 1 cycle.   Labs were reviewed. He will proceed with cycle #2 today as scheduled.    We will see him back for labs and follow up in 3 weeks before undergoing day 1 cycle 3.  Type 2 diabetes mellitus Longstanding diabetes with recent improvement in glycemic control following initiation of insulin  therapy. Glycemic control critical during chemotherapy.  - Advised to notify staff if infusion site burning recurs. - Discussed use of cough drops or low-sugar candy for cough management. - Provided guidance to keep loperamide and laxatives at home for gastrointestinal side effects. - Reviewed and acknowledged improvement in blood glucose values.  The patient was advised to call immediately if she has any concerning symptoms in the interval. The patient voices understanding of current disease status and treatment options and is in  agreement with the current care plan. All questions were answered. The patient knows to call the clinic with any problems, questions or concerns. We can certainly see the patient much sooner if necessary     No orders of the defined types were placed in this encounter.    The total time spent in the appointment was 20-29 minutes  Bhavya Eschete L Jesicca Dipierro, PA-C 03/29/24

## 2024-03-29 ENCOUNTER — Inpatient Hospital Stay

## 2024-03-29 ENCOUNTER — Inpatient Hospital Stay: Admitting: Physician Assistant

## 2024-03-29 VITALS — BP 114/67 | HR 66 | Resp 18

## 2024-03-29 VITALS — BP 110/63 | HR 63 | Temp 98.6°F | Resp 17 | Ht 73.0 in | Wt 212.0 lb

## 2024-03-29 DIAGNOSIS — C3491 Malignant neoplasm of unspecified part of right bronchus or lung: Secondary | ICD-10-CM | POA: Diagnosis not present

## 2024-03-29 DIAGNOSIS — Z5112 Encounter for antineoplastic immunotherapy: Secondary | ICD-10-CM | POA: Diagnosis not present

## 2024-03-29 DIAGNOSIS — Z5111 Encounter for antineoplastic chemotherapy: Secondary | ICD-10-CM

## 2024-03-29 LAB — CMP (CANCER CENTER ONLY)
ALT: 13 U/L (ref 0–44)
AST: 18 U/L (ref 15–41)
Albumin: 3.9 g/dL (ref 3.5–5.0)
Alkaline Phosphatase: 68 U/L (ref 38–126)
Anion gap: 15 (ref 5–15)
BUN: 14 mg/dL (ref 8–23)
CO2: 21 mmol/L — ABNORMAL LOW (ref 22–32)
Calcium: 8.6 mg/dL — ABNORMAL LOW (ref 8.9–10.3)
Chloride: 101 mmol/L (ref 98–111)
Creatinine: 1.1 mg/dL (ref 0.61–1.24)
GFR, Estimated: >60 mL/min
Glucose, Bld: 149 mg/dL — ABNORMAL HIGH (ref 70–99)
Potassium: 4 mmol/L (ref 3.5–5.1)
Sodium: 137 mmol/L (ref 135–145)
Total Bilirubin: 0.3 mg/dL (ref 0.0–1.2)
Total Protein: 6.8 g/dL (ref 6.5–8.1)

## 2024-03-29 LAB — CBC WITH DIFFERENTIAL (CANCER CENTER ONLY)
Abs Immature Granulocytes: 0.03 K/uL (ref 0.00–0.07)
Basophils Absolute: 0.1 K/uL (ref 0.0–0.1)
Basophils Relative: 1 %
Eosinophils Absolute: 0.1 K/uL (ref 0.0–0.5)
Eosinophils Relative: 1 %
HCT: 32.9 % — ABNORMAL LOW (ref 39.0–52.0)
Hemoglobin: 11.4 g/dL — ABNORMAL LOW (ref 13.0–17.0)
Immature Granulocytes: 0 %
Lymphocytes Relative: 23 %
Lymphs Abs: 1.7 K/uL (ref 0.7–4.0)
MCH: 30.5 pg (ref 26.0–34.0)
MCHC: 34.7 g/dL (ref 30.0–36.0)
MCV: 88 fL (ref 80.0–100.0)
Monocytes Absolute: 0.8 K/uL (ref 0.1–1.0)
Monocytes Relative: 11 %
Neutro Abs: 4.5 K/uL (ref 1.7–7.7)
Neutrophils Relative %: 64 %
Platelet Count: 219 K/uL (ref 150–400)
RBC: 3.74 MIL/uL — ABNORMAL LOW (ref 4.22–5.81)
RDW: 14.8 % (ref 11.5–15.5)
WBC Count: 7.2 K/uL (ref 4.0–10.5)
nRBC: 0 % (ref 0.0–0.2)

## 2024-03-29 MED ORDER — FAMOTIDINE IN NACL 20-0.9 MG/50ML-% IV SOLN
20.0000 mg | Freq: Once | INTRAVENOUS | Status: AC
  Administered 2024-03-29: 20 mg via INTRAVENOUS
  Filled 2024-03-29: qty 50

## 2024-03-29 MED ORDER — SODIUM CHLORIDE 0.9 % IV SOLN
1000.0000 mg/m2 | Freq: Once | INTRAVENOUS | Status: AC
  Administered 2024-03-29: 2204 mg via INTRAVENOUS
  Filled 2024-03-29: qty 57.97

## 2024-03-29 MED ORDER — SODIUM CHLORIDE 0.9 % IV SOLN: INTRAVENOUS | Status: DC

## 2024-03-29 MED ORDER — PROCHLORPERAZINE MALEATE 10 MG PO TABS
10.0000 mg | ORAL_TABLET | Freq: Once | ORAL | Status: AC
  Administered 2024-03-29: 10 mg via ORAL
  Filled 2024-03-29: qty 1

## 2024-03-29 NOTE — Patient Instructions (Signed)
 [facility] - A DEPT OF [facility]  Discharge Instructions: Thank you for choosing [facility] to provide your oncology and hematology care.   If you have a lab appointment with the Cancer Center, please go directly to the Cancer Center and check in at the registration area.   Wear comfortable clothing and clothing appropriate for easy access to any Portacath or PICC line.   We strive to give you quality time with your provider. You may need to reschedule your appointment if you arrive late (15 or more minutes).  Arriving late affects you and other patients whose appointments are after yours.  Also, if you miss three or more appointments without notifying the office, you may be dismissed from the clinic at the provider's discretion.      For prescription refill requests, have your pharmacy contact our office and allow 72 hours for refills to be completed.    Today you received the following chemotherapy and/or immunotherapy agents gemzar      To help prevent nausea and vomiting after your treatment, we encourage you to take your nausea medication as directed.  BELOW ARE SYMPTOMS THAT SHOULD BE REPORTED IMMEDIATELY: *FEVER GREATER THAN 100.4 F (38 C) OR HIGHER *CHILLS OR SWEATING *NAUSEA AND VOMITING THAT IS NOT CONTROLLED WITH YOUR NAUSEA MEDICATION *UNUSUAL SHORTNESS OF BREATH *UNUSUAL BRUISING OR BLEEDING *URINARY PROBLEMS (pain or burning when urinating, or frequent urination) *BOWEL PROBLEMS (unusual diarrhea, constipation, pain near the anus) TENDERNESS IN MOUTH AND THROAT WITH OR WITHOUT PRESENCE OF ULCERS (sore throat, sores in mouth, or a toothache) UNUSUAL RASH, SWELLING OR PAIN  UNUSUAL VAGINAL DISCHARGE OR ITCHING   Items with * indicate a potential emergency and should be followed up as soon as possible or go to the Emergency Department if any problems should occur.  Please show the CHEMOTHERAPY ALERT CARD or IMMUNOTHERAPY  ALERT CARD at check-in to the Emergency Department and triage nurse.  Should you have questions after your visit or need to cancel or reschedule your appointment, please contact [facility] - A DEPT OF [facility]  Dept: [phone]  and follow the prompts.  Office hours are 8:00 a.m. to 4:30 p.m. Monday - Friday. Please note that voicemails left after 4:00 p.m. may not be returned until the following business day.  We are closed weekends and major holidays. You have access to a nurse at all times for urgent questions. Please call the main number to the clinic Dept: [phone] and follow the prompts.   For any non-urgent questions, you may also contact your provider using MyChart. We now offer e-Visits for anyone 19 and older to request care online for non-urgent symptoms. For details visit [email or web address].   Also download the MyChart app! Go to the app store, search "MyChart", open the app, select [facility], and log in with your MyChart username and password.

## 2024-04-04 ENCOUNTER — Inpatient Hospital Stay: Admitting: Internal Medicine

## 2024-04-04 ENCOUNTER — Inpatient Hospital Stay

## 2024-04-05 ENCOUNTER — Inpatient Hospital Stay

## 2024-04-05 ENCOUNTER — Inpatient Hospital Stay: Admitting: Internal Medicine

## 2024-04-05 VITALS — BP 108/62 | HR 75 | Temp 97.8°F | Resp 18 | Wt 207.8 lb

## 2024-04-05 DIAGNOSIS — C3491 Malignant neoplasm of unspecified part of right bronchus or lung: Secondary | ICD-10-CM

## 2024-04-05 DIAGNOSIS — Z5112 Encounter for antineoplastic immunotherapy: Secondary | ICD-10-CM | POA: Diagnosis not present

## 2024-04-05 LAB — CMP (CANCER CENTER ONLY)
ALT: 30 U/L (ref 0–44)
AST: 29 U/L (ref 15–41)
Albumin: 4.1 g/dL (ref 3.5–5.0)
Alkaline Phosphatase: 71 U/L (ref 38–126)
Anion gap: 16 — ABNORMAL HIGH (ref 5–15)
BUN: 16 mg/dL (ref 8–23)
CO2: 21 mmol/L — ABNORMAL LOW (ref 22–32)
Calcium: 9.3 mg/dL (ref 8.9–10.3)
Chloride: 101 mmol/L (ref 98–111)
Creatinine: 1.13 mg/dL (ref 0.61–1.24)
GFR, Estimated: >60 mL/min
Glucose, Bld: 176 mg/dL — ABNORMAL HIGH (ref 70–99)
Potassium: 4 mmol/L (ref 3.5–5.1)
Sodium: 138 mmol/L (ref 135–145)
Total Bilirubin: 0.4 mg/dL (ref 0.0–1.2)
Total Protein: 7.3 g/dL (ref 6.5–8.1)

## 2024-04-05 LAB — CBC WITH DIFFERENTIAL (CANCER CENTER ONLY)
Abs Immature Granulocytes: 0.14 10*3/uL — ABNORMAL HIGH (ref 0.00–0.07)
Basophils Absolute: 0.1 10*3/uL (ref 0.0–0.1)
Basophils Relative: 1 %
Eosinophils Absolute: 0 10*3/uL (ref 0.0–0.5)
Eosinophils Relative: 0 %
HCT: 35.7 % — ABNORMAL LOW (ref 39.0–52.0)
Hemoglobin: 12.3 g/dL — ABNORMAL LOW (ref 13.0–17.0)
Immature Granulocytes: 2 %
Lymphocytes Relative: 25 %
Lymphs Abs: 1.9 10*3/uL (ref 0.7–4.0)
MCH: 30.4 pg (ref 26.0–34.0)
MCHC: 34.5 g/dL (ref 30.0–36.0)
MCV: 88.1 fL (ref 80.0–100.0)
Monocytes Absolute: 0.9 10*3/uL (ref 0.1–1.0)
Monocytes Relative: 12 %
Neutro Abs: 4.7 10*3/uL (ref 1.7–7.7)
Neutrophils Relative %: 60 %
Platelet Count: 193 10*3/uL (ref 150–400)
RBC: 4.05 MIL/uL — ABNORMAL LOW (ref 4.22–5.81)
RDW: 14.6 % (ref 11.5–15.5)
WBC Count: 7.8 10*3/uL (ref 4.0–10.5)
nRBC: 0.8 % — ABNORMAL HIGH (ref 0.0–0.2)

## 2024-04-05 MED ORDER — FAMOTIDINE IN NACL 20-0.9 MG/50ML-% IV SOLN
20.0000 mg | Freq: Once | INTRAVENOUS | Status: AC
  Administered 2024-04-05: 20 mg via INTRAVENOUS
  Filled 2024-04-05: qty 50

## 2024-04-05 MED ORDER — PROCHLORPERAZINE MALEATE 10 MG PO TABS
10.0000 mg | ORAL_TABLET | Freq: Once | ORAL | Status: AC
  Administered 2024-04-05: 10 mg via ORAL
  Filled 2024-04-05: qty 1

## 2024-04-05 MED ORDER — SODIUM CHLORIDE 0.9 % IV SOLN: INTRAVENOUS | Status: DC

## 2024-04-05 MED ORDER — SODIUM CHLORIDE 0.9 % IV SOLN
1000.0000 mg/m2 | Freq: Once | INTRAVENOUS | Status: AC
  Administered 2024-04-05: 2204 mg via INTRAVENOUS
  Filled 2024-04-05: qty 57.97

## 2024-04-05 NOTE — Patient Instructions (Signed)

## 2024-04-14 ENCOUNTER — Telehealth: Payer: Self-pay

## 2024-04-14 NOTE — Telephone Encounter (Signed)
 Copied from CRM [id number]. Topic: Clinical - Medication Refill >> Apr 14, 2024 10:26 AM Harlene ORN wrote: Medication: HYDROcodone -acetaminophen  (NORCO) 10-325 MG tablet  Has the patient contacted their pharmacy? No (Agent: If no, request that the patient contact the pharmacy for the refill. If patient does not wish to contact the pharmacy document the reason why and proceed with request.) (Agent: If yes, when and what did the pharmacy advise?)  This is the patient's preferred pharmacy:  [id number] [place], [place] - [address] AT [facility] OF [address] & [facility] [address] [place] [address] Phone: [phone] Fax: [phone]  Is this the correct pharmacy for this prescription? Yes If no, delete pharmacy and type the correct one.   Has the prescription been filled recently? Yes  Is the patient out of the medication? No  Has the patient been seen for an appointment in the last year OR does the patient have an upcoming appointment? No  Can we respond through MyChart? No  Agent: Please be advised that Rx refills may take up to 3 business days. We ask that you follow-up with your pharmacy.

## 2024-04-15 MED ORDER — HYDROCODONE-ACETAMINOPHEN 10-325 MG PO TABS: 1.0000 | ORAL_TABLET | Freq: Three times a day (TID) | ORAL | 0 refills | Status: AC | PRN

## 2024-04-15 NOTE — Telephone Encounter (Signed)
 Pt is aware.

## 2024-04-15 NOTE — Progress Notes (Unsigned)
 [facility] OFFICE PROGRESS NOTE  Rollene Almarie LABOR, MD [address] [place] [address]  DIAGNOSIS: Metastatic non-small cell lung cancer initially diagnosed as stage IIIA (T4, N0, M0) non-small cell lung cancer, favoring adenocarcinoma presented with large right lower lobe lung mass with no mediastinal lymphadenopathy or extrathoracic metastasis diagnosed in October 2022. Had disease progression with enlarging mass and new right hilar and mediastinal lymph nodes in December 2022.    Molecular studies by foundation 1 showed no actionable mutations. cMET expression 10% PDL1 TPS 0%  PRIOR THERAPY: 1) Neoadjuvant treatment with carboplatin  for AUC of 5, Alimta  500 Mg/M2 and nivolumab  360 Mg IV every 3 weeks.  First dose January 23, 2021.  Status post 3 cycles. 2) Concurrent chemoradiation with carboplatin  for an AUC of 2 and paclitaxel  45 mg per metered squared.  First dose expected on 04/01/2021.  Status post 4 cycles 3) Consolidation treatment with immunotherapy with Imfinzi  1500 Mg IV every 4 weeks.  First dose on Jul 08, 2021.  Status post 9 cycles.  4) Palliative systemic chemotherapy with carboplatin  for an AUC of 5, Alimta  500 mg/m, Keytruda  200 mg IV every 3 weeks. First dose on 05/13/2022.  Status post 12 cycles.  Starting him cycle #3 his dose of carboplatin  will be reduced to AUC of 4 and Alimta  400 Mg/M2 secondary to intolerance.  Last dose was given January 26, 2023 discontinued secondary to disease progression. 5) Second line systemic chemotherapy with docetaxel  65 Mg/M2 and ramucirumab  10 Mg/KG every 3 weeks with Neulasta  support.  First dose April 08, 2023.  Status post 6  cycles.  His treatment is currently on hold secondary to toxicity and recent hospitalization.  Last dose was given Jul 22, 2023 discontinued secondary to intolerance  CURRENT THERAPY: Gemcitabine  1000 mg/M2 on days 1 and 8 every 3 weeks. First dose March 08, 2024. Status post cycle 2  cycles.  INTERVAL HISTORY: Brandon Rubio. 74 y.o. male returns to the clinic today for a follow-up visit.  The patient was last seen in clinic by myself on 03/29/24. The patient has a history of metastatic non-small cell lung cancer that was initially diagnosed as stage III in October 202.  He was found to have evidence of disease progression and started on chemotherapy in March 2024.  He had some intolerance to chemotherapy with docetaxel  and cyramza  and he had been on observation.   At his recent appointment with Dr. Sherrod in early December, he was found to have progression.  Therefore he recently resumed treatment with chemotherapy with gemcitabine .   He tolerated his last cycle of treatment well except for ***.   ***rash? He has been receiving cancer therapy for over three years and is currently undergoing infusions. He denies any issues with his most recent treatment. He experiences chronic fatigue and weakness, which are stable. He has a persistent cough present most of the day, associated with phlegm production but without hemoptysis or abnormal sputum coloration. He denies fevers, chills, chest pain, or new respiratory symptoms. Exercise tolerance remains limited, requiring rest after walking short distances, though he notes some improvement with increased activity. He denies nausea or vomiting.   ***Diarrhea occurred after the first treatment but has not recurred, and bowel habits are otherwise unchanged. He describes burning at the infusion site during recent treatments, and reports that the use of hot packs helped relieve the discomfort.   He has cataracts but has not scheduled surgery, as they are not considered  severe***. He attributes some visual changes to previously elevated blood glucose, which has since improved.     He is here today for evaluation and repeat blood work before undergoing cycle #3.  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  MEDICATIONS:  Current Outpatient Medications  Medication Sig Dispense Refill   aspirin  EC 81 MG tablet Take 81 mg by mouth in the morning. Swallow whole.     dexamethasone  (DECADRON ) 0.75 MG tablet TAKE 1 TABLET(0.75 MG) BY MOUTH DAILY 100 tablet 1   HYDROcodone -acetaminophen  (NORCO) 10-325 MG tablet Take 1 tablet by mouth every 8 (eight) hours as needed. 75 tablet 0   hydrocortisone  cream 0.5 % Apply 1 Application topically 2 (two) times daily. 30 g 0   insulin  glargine (LANTUS  SOLOSTAR) 100 UNIT/ML Solostar Pen Inject 15 Units into the skin daily. 15 mL 0   Insulin  Pen Needle 32G X 4 MM MISC Use once a day as directed. 100 each 0   magnesium  oxide (MAG-OX) 400 (240 Mg) MG tablet Take 1 tablet by mouth 2 (two) times daily.     metFORMIN  (GLUCOPHAGE ) 1000 MG tablet Take 1 tablet (1,000 mg total) by mouth 2 (two) times daily with a meal. 180 tablet 3   metoprolol  tartrate (LOPRESSOR ) 25 MG tablet TAKE 1 TABLET(25 MG) BY MOUTH TWICE DAILY 180 tablet 2   nystatin  (MYCOSTATIN /NYSTOP ) powder Apply 1 Application topically 3 (three) times daily. 15 g 0   omeprazole  (PRILOSEC) 20 MG capsule TAKE 1 CAPSULE BY MOUTH TWICE DAILY BEFORE A MEAL 180 capsule 1   ondansetron  (ZOFRAN ) 8 MG tablet Take 1 tablet (8 mg total) by mouth every 8 (eight) hours as needed for nausea or vomiting. 30 tablet 1   pantoprazole  (PROTONIX ) 40 MG  tablet Take 1 tablet (40 mg total) by mouth daily. 90 tablet 3   prochlorperazine  (COMPAZINE ) 10 MG tablet Take 1 tablet (10 mg total) by mouth every 6 (six) hours as needed for nausea or vomiting. 30 tablet 1   simvastatin  (ZOCOR ) 40 MG tablet TAKE 1 TABLET BY MOUTH EVERY EVENING AT 6 PM 90 tablet 0   No current facility-administered medications for this visit.    SURGICAL HISTORY:  Past Surgical History:  Procedure Laterality Date   BRONCHIAL BIOPSY  12/10/2020   Procedure: BRONCHIAL BIOPSIES;  Surgeon: Shelah Lamar RAMAN, MD;  Location: [facility] ENDOSCOPY;  Service: Pulmonary;;   BRONCHIAL BRUSHINGS  12/10/2020   Procedure: BRONCHIAL BRUSHINGS;  Surgeon: Shelah Lamar RAMAN, MD;  Location: [facility] ENDOSCOPY;  Service: Pulmonary;;   BRONCHIAL NEEDLE ASPIRATION BIOPSY  12/10/2020   Procedure: BRONCHIAL NEEDLE ASPIRATION BIOPSIES;  Surgeon: Shelah Lamar RAMAN, MD;  Location: [facility] ENDOSCOPY;  Service: Pulmonary;;   COLONOSCOPY     COLONOSCOPY W/ POLYPECTOMY  03/10/2009   LARYNGOSCOPY  03/10/1997  VIDEO BRONCHOSCOPY WITH ENDOBRONCHIAL NAVIGATION N/A 12/10/2020   Procedure: ROBOTIC VIDEO BRONCHOSCOPY WITH ENDOBRONCHIAL NAVIGATION;  Surgeon: Shelah Lamar RAMAN, MD;  Location: [facility] ENDOSCOPY;  Service: Pulmonary;  Laterality: N/A;   VIDEO BRONCHOSCOPY WITH ENDOBRONCHIAL NAVIGATION Left 12/29/2023   Procedure: VIDEO BRONCHOSCOPY WITH ENDOBRONCHIAL NAVIGATION;  Surgeon: Shelah Lamar RAMAN, MD;  Location: [facility] ENDOSCOPY;  Service: Pulmonary;  Laterality: Left;    REVIEW OF SYSTEMS:   Review of Systems  Constitutional: Negative for appetite change, chills, fatigue, fever and unexpected weight change.  HENT:   Negative for mouth sores, nosebleeds, sore throat and trouble swallowing.   Eyes: Negative for eye problems and icterus.  Respiratory: Negative for cough, hemoptysis, shortness of breath and wheezing.   Cardiovascular: Negative for chest pain and leg swelling.  Gastrointestinal: Negative for abdominal pain,  constipation, diarrhea, nausea and vomiting.  Genitourinary: Negative for bladder incontinence, difficulty urinating, dysuria, frequency and hematuria.   Musculoskeletal: Negative for back pain, gait problem, neck pain and neck stiffness.  Skin: Negative for itching and rash.  Neurological: Negative for dizziness, extremity weakness, gait problem, headaches, light-headedness and seizures.  Hematological: Negative for adenopathy. Does not bruise/bleed easily.  Psychiatric/Behavioral: Negative for confusion, depression and sleep disturbance. The patient is not nervous/anxious.     PHYSICAL EXAMINATION:  There were no vitals taken for this visit.  ECOG PERFORMANCE STATUS: {CHL ONC ECOG [id number]  Physical Exam  Constitutional: Oriented to person, place, and time and well-developed, well-nourished, and in no distress. No distress.  HENT:  Head: Normocephalic and atraumatic.  Mouth/Throat: Oropharynx is clear and moist. No oropharyngeal exudate.  Eyes: Conjunctivae are normal. Right eye exhibits no discharge. Left eye exhibits no discharge. No scleral icterus.  Neck: Normal range of motion. Neck supple.  Cardiovascular: Normal rate, regular rhythm, normal heart sounds and intact distal pulses.   Pulmonary/Chest: Effort normal and breath sounds normal. No respiratory distress. No wheezes. No rales.  Abdominal: Soft. Bowel sounds are normal. Exhibits no distension and no mass. There is no tenderness.  Musculoskeletal: Normal range of motion. Exhibits no edema.  Lymphadenopathy:    No cervical adenopathy.  Neurological: Alert and oriented to person, place, and time. Exhibits normal muscle tone. Gait normal. Coordination normal.  Skin: Skin is warm and dry. No rash noted. Not diaphoretic. No erythema. No pallor.  Psychiatric: Mood, memory and judgment normal.  Vitals reviewed.  LABORATORY DATA: Lab Results  Component Value Date   WBC 7.8 04/05/2024   HGB 12.3 (L) 04/05/2024   HCT  35.7 (L) 04/05/2024   MCV 88.1 04/05/2024   PLT 193 04/05/2024      Chemistry      Component Value Date/Time   NA 138 04/05/2024 0947   NA 137 10/24/2022 1540   K 4.0 04/05/2024 0947   CL 101 04/05/2024 0947   CO2 21 (L) 04/05/2024 0947   BUN 16 04/05/2024 0947   BUN 9 10/24/2022 1540   CREATININE 1.13 04/05/2024 0947      Component Value Date/Time   CALCIUM  9.3 04/05/2024 0947   ALKPHOS 71 04/05/2024 0947   AST 29 04/05/2024 0947   ALT 30 04/05/2024 0947   BILITOT 0.4 04/05/2024 0947       RADIOGRAPHIC STUDIES:  No results found.   ASSESSMENT/PLAN:  This is a very pleasant 74 year old Caucasian male with recurrent and metastatic non-small cell lung cancer, adenocarcinoma. He was initially diagnosed as a stage IIIa (T4, N1, M0). He presented with a right upper lobe mass and right hilar  and mediastinal lymphadenopathy. He was initially diagnosed in October 2022. The patient developed metastatic disease with bilateral pulmonary nodules in January 2024. He does not have any actionable mutations by foundation one.    The patient initially underwent 3 cycles of neoadjuvant systemic chemotherapy with carboplatin , Alimta , nivolumab .  Unfortunately the patient did not have significant improvement in his disease and actually developed disease progression.    He then underwent a course of concurrent chemoradiation.   He is currently on consolidation immunotherapy with Imfinzi  1500 mg IV every 4 weeks and is status post 9 cycles   Therefore, he was started on systemic chemotherapy with carboplatin  for an AUC of 5, Alimta  500 mg/m, and Keytruda  200 mg IV every 3 weeks. He underwent his first dose of treatment on 05/13/22.  He is status post 11 cycles.  His dose of carboplatin  was reduced to an AUC of 5 and Alimta  to 400 mg/m2 starting from cycle #3 due to intolerance.  He started maintenance treatment with dose reduced Alimta  400 mg/m and Keytruda  200 mg IV every 3 weeks starting from  cycle #5. This was discontinued due to disease progression.    He then was on ystemic chemotherapy with reduced dose docetaxel  65 Mg/M2 and ramucirumab  10 Mg/KG every 3 weeks status post 5 cycles. The Neulasta  injection was discontinued secondary to intolerance.  His treatment is currently on hold secondary to toxicity.   He recently had disease progression with repeat bronchoscopy and biopsy of the left lung opacity which is consistent with metastatic adenocarcinoma.    Dr. Sherrod recommended gemcitabine  chemotherapy regimen: He started this on 03/08/2024.  He status post 2 cycles.   Labs were reviewed. He will proceed with cycle #3 today as scheduled.   I will arrange for a restaging CT scan prior to the next cycle of treatment.   We will see him back for labs and follow up in 3 weeks before undergoing day 1 cycle 4.  The patient was advised to call immediately if he has any concerning symptoms in the interval. The patient voices understanding of current disease status and treatment options and is in agreement with the current care plan. All questions were answered. The patient knows to call the clinic with any problems, questions or concerns. We can certainly see the patient much sooner if necessary    No orders of the defined types were placed in this encounter.    I spent {CHL ONC TIME VISIT - DTPQU:[id number]} counseling the patient face to face. The total time spent in the appointment was {CHL ONC TIME VISIT - DTPQU:[id number]}.  Edee Nifong L Glynn Yepes, PA-C 04/15/24

## 2024-04-15 NOTE — Telephone Encounter (Signed)
 Sent in

## 2024-04-15 NOTE — Addendum Note (Signed)
 Addended by: ROLLENE NORRIS A on: 04/15/2024 07:45 AM   Modules accepted: Orders

## 2024-04-19 ENCOUNTER — Inpatient Hospital Stay

## 2024-04-19 ENCOUNTER — Inpatient Hospital Stay: Attending: Internal Medicine

## 2024-04-19 ENCOUNTER — Inpatient Hospital Stay: Admitting: Physician Assistant

## 2024-04-26 ENCOUNTER — Inpatient Hospital Stay

## 2024-05-10 ENCOUNTER — Inpatient Hospital Stay: Admitting: Internal Medicine

## 2024-05-10 ENCOUNTER — Inpatient Hospital Stay

## 2024-05-17 ENCOUNTER — Inpatient Hospital Stay

## 2024-05-31 ENCOUNTER — Inpatient Hospital Stay

## 2024-05-31 ENCOUNTER — Inpatient Hospital Stay: Admitting: Physician Assistant

## 2024-06-07 ENCOUNTER — Inpatient Hospital Stay

## 2024-06-21 ENCOUNTER — Inpatient Hospital Stay: Admitting: Internal Medicine

## 2024-06-21 ENCOUNTER — Inpatient Hospital Stay: Attending: Internal Medicine

## 2024-06-21 ENCOUNTER — Inpatient Hospital Stay

## 2024-06-28 ENCOUNTER — Inpatient Hospital Stay

## 2024-09-19 ENCOUNTER — Ambulatory Visit
